# Patient Record
Sex: Female | Born: 1940
Health system: Southern US, Community
[De-identification: ages and names within clinical notes are randomized; demographics above are authoritative.]

## PROBLEM LIST (undated history)

## (undated) DIAGNOSIS — I2510 Atherosclerotic heart disease of native coronary artery without angina pectoris: Secondary | ICD-10-CM

## (undated) DIAGNOSIS — I10 Essential (primary) hypertension: Secondary | ICD-10-CM

## (undated) DIAGNOSIS — Z9989 Dependence on other enabling machines and devices: Secondary | ICD-10-CM

## (undated) DIAGNOSIS — G8190 Hemiplegia, unspecified affecting unspecified side: Secondary | ICD-10-CM

## (undated) DIAGNOSIS — I499 Cardiac arrhythmia, unspecified: Secondary | ICD-10-CM

## (undated) DIAGNOSIS — N183 Chronic kidney disease, stage 3 unspecified: Secondary | ICD-10-CM

## (undated) DIAGNOSIS — I255 Ischemic cardiomyopathy: Secondary | ICD-10-CM

## (undated) DIAGNOSIS — D696 Thrombocytopenia, unspecified: Secondary | ICD-10-CM

## (undated) DIAGNOSIS — I5022 Chronic systolic (congestive) heart failure: Secondary | ICD-10-CM

## (undated) DIAGNOSIS — Z7901 Long term (current) use of anticoagulants: Secondary | ICD-10-CM

## (undated) DIAGNOSIS — E1142 Type 2 diabetes mellitus with diabetic polyneuropathy: Secondary | ICD-10-CM

## (undated) DIAGNOSIS — M1990 Unspecified osteoarthritis, unspecified site: Secondary | ICD-10-CM

## (undated) DIAGNOSIS — I509 Heart failure, unspecified: Secondary | ICD-10-CM

## (undated) DIAGNOSIS — G894 Chronic pain syndrome: Secondary | ICD-10-CM

## (undated) DIAGNOSIS — I6930 Unspecified sequelae of cerebral infarction: Secondary | ICD-10-CM

## (undated) DIAGNOSIS — M545 Low back pain, unspecified: Secondary | ICD-10-CM

## (undated) DIAGNOSIS — G4733 Obstructive sleep apnea (adult) (pediatric): Secondary | ICD-10-CM

## (undated) DIAGNOSIS — F411 Generalized anxiety disorder: Secondary | ICD-10-CM

## (undated) DIAGNOSIS — K625 Hemorrhage of anus and rectum: Secondary | ICD-10-CM

## (undated) DIAGNOSIS — E785 Hyperlipidemia, unspecified: Secondary | ICD-10-CM

## (undated) DIAGNOSIS — I63411 Cerebral infarction due to embolism of right middle cerebral artery: Secondary | ICD-10-CM

## (undated) DIAGNOSIS — K219 Gastro-esophageal reflux disease without esophagitis: Secondary | ICD-10-CM

## (undated) DIAGNOSIS — H3530 Unspecified macular degeneration: Secondary | ICD-10-CM

## (undated) DIAGNOSIS — K922 Gastrointestinal hemorrhage, unspecified: Secondary | ICD-10-CM

## (undated) DIAGNOSIS — I4819 Other persistent atrial fibrillation: Secondary | ICD-10-CM

## (undated) DIAGNOSIS — I69392 Facial weakness following cerebral infarction: Secondary | ICD-10-CM

## (undated) DIAGNOSIS — G8929 Other chronic pain: Secondary | ICD-10-CM

## (undated) DIAGNOSIS — I4811 Longstanding persistent atrial fibrillation: Secondary | ICD-10-CM

## (undated) HISTORY — PX: EYE SURGERY: SHX253

## (undated) HISTORY — DX: Unspecified macular degeneration: H35.30

## (undated) HISTORY — PX: BACK SURGERY: SHX140

## (undated) HISTORY — DX: Obstructive sleep apnea (adult) (pediatric): Z99.89

## (undated) HISTORY — PX: HERNIA REPAIR: SHX51

## (undated) HISTORY — DX: Ischemic cardiomyopathy: I25.5

## (undated) HISTORY — DX: Longstanding persistent atrial fibrillation: I48.11

## (undated) HISTORY — DX: Obstructive sleep apnea (adult) (pediatric): G47.33

## (undated) HISTORY — PX: TEE WITH CARDIOVERSION: SHX5442

## (undated) HISTORY — DX: Atherosclerotic heart disease of native coronary artery without angina pectoris: I25.10

## (undated) HISTORY — DX: Hyperlipidemia, unspecified: E78.5

## (undated) HISTORY — DX: Hemorrhage of anus and rectum: K62.5

## (undated) HISTORY — DX: Long term (current) use of anticoagulants: Z79.01

## (undated) HISTORY — DX: Chronic systolic (congestive) heart failure: I50.22

## (undated) HISTORY — PX: JOINT REPLACEMENT: SHX530

---
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
## 1992-11-12 HISTORY — PX: TOTAL KNEE ARTHROPLASTY: SHX125

## 1998-02-07 ENCOUNTER — Other Ambulatory Visit: Admission: RE | Admit: 1998-02-07 | Discharge: 1998-02-07 | Payer: Self-pay | Admitting: Sports Medicine

## 1998-05-18 ENCOUNTER — Other Ambulatory Visit: Admission: RE | Admit: 1998-05-18 | Discharge: 1998-05-18 | Payer: Self-pay | Admitting: Sports Medicine

## 1999-10-13 ENCOUNTER — Encounter: Payer: Self-pay | Admitting: General Surgery

## 1999-10-13 HISTORY — PX: VENTRAL HERNIA REPAIR: SHX424

## 1999-10-16 ENCOUNTER — Inpatient Hospital Stay (HOSPITAL_COMMUNITY): Admission: RE | Admit: 1999-10-16 | Discharge: 1999-10-18 | Payer: Self-pay | Admitting: General Surgery

## 2000-02-29 ENCOUNTER — Encounter: Payer: Self-pay | Admitting: Cardiovascular Disease

## 2004-04-05 ENCOUNTER — Ambulatory Visit (HOSPITAL_COMMUNITY): Admission: RE | Admit: 2004-04-05 | Discharge: 2004-04-05 | Payer: Self-pay | Admitting: Sports Medicine

## 2005-07-26 ENCOUNTER — Encounter: Admission: RE | Admit: 2005-07-26 | Discharge: 2005-07-26 | Payer: Self-pay | Admitting: Sports Medicine

## 2006-04-02 ENCOUNTER — Encounter: Admission: RE | Admit: 2006-04-02 | Discharge: 2006-04-02 | Payer: Self-pay | Admitting: Sports Medicine

## 2006-04-24 ENCOUNTER — Encounter: Admission: RE | Admit: 2006-04-24 | Discharge: 2006-04-24 | Payer: Self-pay | Admitting: Sports Medicine

## 2006-05-16 ENCOUNTER — Encounter: Admission: RE | Admit: 2006-05-16 | Discharge: 2006-05-16 | Payer: Self-pay | Admitting: Sports Medicine

## 2006-06-04 ENCOUNTER — Encounter: Admission: RE | Admit: 2006-06-04 | Discharge: 2006-06-04 | Payer: Self-pay | Admitting: Sports Medicine

## 2007-11-13 HISTORY — PX: CORONARY ANGIOPLASTY WITH STENT PLACEMENT: SHX49

## 2008-05-06 ENCOUNTER — Ambulatory Visit: Payer: Self-pay

## 2008-05-06 ENCOUNTER — Ambulatory Visit: Payer: Self-pay | Admitting: Cardiology

## 2008-05-06 LAB — CONVERTED CEMR LAB
Basophils Absolute: 0 10*3/uL (ref 0.0–0.1)
Basophils Relative: 0.6 % (ref 0.0–1.0)
CO2: 28 meq/L (ref 19–32)
Chloride: 98 meq/L (ref 96–112)
Creatinine, Ser: 1 mg/dL (ref 0.4–1.2)
Glucose, Bld: 126 mg/dL — ABNORMAL HIGH (ref 70–99)
INR: 1.1 — ABNORMAL HIGH (ref 0.8–1.0)
Lymphocytes Relative: 21 % (ref 12.0–46.0)
MCHC: 33 g/dL (ref 30.0–36.0)
Monocytes Relative: 8.4 % (ref 3.0–12.0)
Neutrophils Relative %: 64.6 % (ref 43.0–77.0)
Prothrombin Time: 13.1 s (ref 10.9–13.3)
RBC: 3.91 M/uL (ref 3.87–5.11)
Sodium: 137 meq/L (ref 135–145)
aPTT: 26.1 s (ref 21.7–29.8)

## 2008-05-12 ENCOUNTER — Ambulatory Visit: Payer: Self-pay | Admitting: Cardiothoracic Surgery

## 2008-05-12 ENCOUNTER — Encounter: Payer: Self-pay | Admitting: Cardiothoracic Surgery

## 2008-05-12 ENCOUNTER — Inpatient Hospital Stay (HOSPITAL_BASED_OUTPATIENT_CLINIC_OR_DEPARTMENT_OTHER): Admission: RE | Admit: 2008-05-12 | Discharge: 2008-05-12 | Payer: Self-pay | Admitting: Cardiology

## 2008-05-12 ENCOUNTER — Inpatient Hospital Stay (HOSPITAL_COMMUNITY): Admission: AD | Admit: 2008-05-12 | Discharge: 2008-06-10 | Payer: Self-pay | Admitting: Cardiology

## 2008-05-12 ENCOUNTER — Ambulatory Visit: Payer: Self-pay | Admitting: Cardiology

## 2008-05-12 DIAGNOSIS — I2510 Atherosclerotic heart disease of native coronary artery without angina pectoris: Secondary | ICD-10-CM

## 2008-05-12 HISTORY — DX: Atherosclerotic heart disease of native coronary artery without angina pectoris: I25.10

## 2008-05-13 ENCOUNTER — Encounter: Payer: Self-pay | Admitting: Cardiology

## 2008-05-13 ENCOUNTER — Ambulatory Visit: Payer: Self-pay | Admitting: Cardiology

## 2008-05-17 ENCOUNTER — Encounter: Payer: Self-pay | Admitting: Cardiothoracic Surgery

## 2008-06-07 ENCOUNTER — Encounter: Payer: Self-pay | Admitting: Cardiothoracic Surgery

## 2008-06-08 ENCOUNTER — Encounter: Payer: Self-pay | Admitting: Cardiology

## 2008-06-08 ENCOUNTER — Ambulatory Visit: Payer: Self-pay | Admitting: Vascular Surgery

## 2008-06-24 ENCOUNTER — Encounter: Admission: RE | Admit: 2008-06-24 | Discharge: 2008-06-24 | Payer: Self-pay | Admitting: Cardiothoracic Surgery

## 2008-06-24 ENCOUNTER — Ambulatory Visit: Payer: Self-pay | Admitting: Cardiothoracic Surgery

## 2008-07-06 ENCOUNTER — Ambulatory Visit: Payer: Self-pay | Admitting: Cardiology

## 2008-07-06 LAB — CONVERTED CEMR LAB
BUN: 33 mg/dL — ABNORMAL HIGH (ref 6–23)
Basophils Relative: 0.6 % (ref 0.0–3.0)
Calcium: 9.1 mg/dL (ref 8.4–10.5)
Creatinine, Ser: 2.6 mg/dL — ABNORMAL HIGH (ref 0.4–1.2)
Eosinophils Relative: 2.8 % (ref 0.0–5.0)
GFR calc Af Amer: 23 mL/min
Glucose, Bld: 158 mg/dL — ABNORMAL HIGH (ref 70–99)
HCT: 34.5 % — ABNORMAL LOW (ref 36.0–46.0)
Hemoglobin: 11.6 g/dL — ABNORMAL LOW (ref 12.0–15.0)
Monocytes Absolute: 0.7 10*3/uL (ref 0.1–1.0)
Monocytes Relative: 8.2 % (ref 3.0–12.0)
Neutro Abs: 5.1 10*3/uL (ref 1.4–7.7)
WBC: 8.3 10*3/uL (ref 4.5–10.5)

## 2008-07-22 ENCOUNTER — Ambulatory Visit: Payer: Self-pay | Admitting: Cardiothoracic Surgery

## 2008-07-29 ENCOUNTER — Encounter (HOSPITAL_COMMUNITY): Admission: RE | Admit: 2008-07-29 | Discharge: 2008-10-27 | Payer: Self-pay | Admitting: Cardiology

## 2008-08-04 ENCOUNTER — Ambulatory Visit: Payer: Self-pay | Admitting: Cardiology

## 2008-08-11 ENCOUNTER — Ambulatory Visit: Payer: Self-pay | Admitting: Cardiology

## 2008-08-18 ENCOUNTER — Ambulatory Visit: Payer: Self-pay | Admitting: Cardiology

## 2008-08-18 ENCOUNTER — Ambulatory Visit: Payer: Self-pay | Admitting: Internal Medicine

## 2008-09-01 ENCOUNTER — Ambulatory Visit: Payer: Self-pay | Admitting: Cardiology

## 2008-09-29 ENCOUNTER — Ambulatory Visit: Payer: Self-pay | Admitting: Cardiology

## 2008-10-15 ENCOUNTER — Ambulatory Visit: Payer: Self-pay | Admitting: Internal Medicine

## 2008-10-19 ENCOUNTER — Ambulatory Visit: Payer: Self-pay

## 2008-10-19 ENCOUNTER — Encounter: Payer: Self-pay | Admitting: Internal Medicine

## 2008-10-19 ENCOUNTER — Ambulatory Visit: Payer: Self-pay | Admitting: Cardiology

## 2008-10-19 LAB — CONVERTED CEMR LAB
Albumin: 3.8 g/dL (ref 3.5–5.2)
Alkaline Phosphatase: 63 units/L (ref 39–117)
Bilirubin, Direct: 0.1 mg/dL (ref 0.0–0.3)
LDL Cholesterol: 69 mg/dL (ref 0–99)
Total CHOL/HDL Ratio: 2.5
Triglycerides: 49 mg/dL (ref 0–149)

## 2008-10-21 ENCOUNTER — Ambulatory Visit: Payer: Self-pay | Admitting: Cardiology

## 2008-10-21 LAB — CONVERTED CEMR LAB
CO2: 28 meq/L (ref 19–32)
Chloride: 103 meq/L (ref 96–112)
Creatinine, Ser: 1.5 mg/dL — ABNORMAL HIGH (ref 0.4–1.2)
TSH: 1.69 microintl units/mL (ref 0.35–5.50)

## 2008-11-10 ENCOUNTER — Ambulatory Visit: Payer: Self-pay | Admitting: Internal Medicine

## 2008-11-15 ENCOUNTER — Encounter (HOSPITAL_COMMUNITY): Admission: RE | Admit: 2008-11-15 | Discharge: 2008-11-15 | Payer: Self-pay | Admitting: Cardiology

## 2008-12-01 ENCOUNTER — Ambulatory Visit: Payer: Self-pay | Admitting: Internal Medicine

## 2008-12-22 ENCOUNTER — Ambulatory Visit: Payer: Self-pay | Admitting: Cardiology

## 2009-01-31 DIAGNOSIS — I5022 Chronic systolic (congestive) heart failure: Secondary | ICD-10-CM

## 2009-01-31 DIAGNOSIS — I255 Ischemic cardiomyopathy: Secondary | ICD-10-CM

## 2009-01-31 DIAGNOSIS — E119 Type 2 diabetes mellitus without complications: Secondary | ICD-10-CM | POA: Insufficient documentation

## 2009-01-31 DIAGNOSIS — F411 Generalized anxiety disorder: Secondary | ICD-10-CM

## 2009-01-31 DIAGNOSIS — I4891 Unspecified atrial fibrillation: Secondary | ICD-10-CM | POA: Insufficient documentation

## 2009-01-31 DIAGNOSIS — Z8679 Personal history of other diseases of the circulatory system: Secondary | ICD-10-CM | POA: Insufficient documentation

## 2009-01-31 DIAGNOSIS — I509 Heart failure, unspecified: Secondary | ICD-10-CM

## 2009-01-31 DIAGNOSIS — E785 Hyperlipidemia, unspecified: Secondary | ICD-10-CM | POA: Insufficient documentation

## 2009-01-31 DIAGNOSIS — I2510 Atherosclerotic heart disease of native coronary artery without angina pectoris: Secondary | ICD-10-CM | POA: Insufficient documentation

## 2009-01-31 DIAGNOSIS — I25810 Atherosclerosis of coronary artery bypass graft(s) without angina pectoris: Secondary | ICD-10-CM | POA: Insufficient documentation

## 2009-01-31 HISTORY — DX: Generalized anxiety disorder: F41.1

## 2009-01-31 HISTORY — DX: Ischemic cardiomyopathy: I25.5

## 2009-02-01 ENCOUNTER — Ambulatory Visit: Payer: Self-pay | Admitting: Cardiovascular Disease

## 2009-02-01 ENCOUNTER — Encounter: Payer: Self-pay | Admitting: Cardiology

## 2009-02-01 ENCOUNTER — Ambulatory Visit: Payer: Self-pay | Admitting: Cardiology

## 2009-02-01 LAB — CONVERTED CEMR LAB
CO2: 31 meq/L (ref 19–32)
Chloride: 103 meq/L (ref 96–112)
Creatinine, Ser: 1.7 mg/dL — ABNORMAL HIGH (ref 0.4–1.2)

## 2009-02-22 ENCOUNTER — Ambulatory Visit: Payer: Self-pay | Admitting: Cardiology

## 2009-03-14 ENCOUNTER — Ambulatory Visit: Payer: Self-pay | Admitting: Internal Medicine

## 2009-03-31 ENCOUNTER — Ambulatory Visit: Payer: Self-pay | Admitting: Internal Medicine

## 2009-04-13 ENCOUNTER — Encounter: Payer: Self-pay | Admitting: *Deleted

## 2009-04-14 ENCOUNTER — Ambulatory Visit: Payer: Self-pay | Admitting: Cardiology

## 2009-04-14 LAB — CONVERTED CEMR LAB: Protime: 19.5

## 2009-05-04 ENCOUNTER — Ambulatory Visit: Payer: Self-pay | Admitting: Internal Medicine

## 2009-05-04 ENCOUNTER — Encounter (INDEPENDENT_AMBULATORY_CARE_PROVIDER_SITE_OTHER): Payer: Self-pay | Admitting: Pharmacist

## 2009-05-04 ENCOUNTER — Ambulatory Visit: Payer: Self-pay | Admitting: Cardiology

## 2009-05-04 LAB — CONVERTED CEMR LAB
POC INR: 2
Prothrombin Time: 17.6 s

## 2009-05-05 ENCOUNTER — Ambulatory Visit: Payer: Self-pay | Admitting: Cardiology

## 2009-05-09 LAB — CONVERTED CEMR LAB
ALT: 14 units/L (ref 0–35)
Basophils Absolute: 0 10*3/uL (ref 0.0–0.1)
Bilirubin, Direct: 0.1 mg/dL (ref 0.0–0.3)
CO2: 28 meq/L (ref 19–32)
Calcium: 8.8 mg/dL (ref 8.4–10.5)
Chloride: 104 meq/L (ref 96–112)
Eosinophils Absolute: 0.2 10*3/uL (ref 0.0–0.7)
HDL: 50 mg/dL (ref >39.00)
LDL Cholesterol: 59 mg/dL (ref 0–99)
Lymphocytes Relative: 24.4 % (ref 12.0–46.0)
Monocytes Relative: 8.7 % (ref 3.0–12.0)
Platelets: 191 10*3/uL (ref 150.0–400.0)
RDW: 13.8 % (ref 11.5–14.6)
Sodium: 141 meq/L (ref 135–145)
TSH: 2.96 microintl units/mL (ref 0.35–5.50)
Total Bilirubin: 0.8 mg/dL (ref 0.3–1.2)
Total CHOL/HDL Ratio: 2
Triglycerides: 62 mg/dL (ref 0.0–149.0)
VLDL: 12.4 mg/dL (ref 0.0–40.0)

## 2009-05-18 ENCOUNTER — Encounter: Payer: Self-pay | Admitting: *Deleted

## 2009-06-24 ENCOUNTER — Encounter: Payer: Self-pay | Admitting: Cardiology

## 2009-09-06 ENCOUNTER — Ambulatory Visit: Payer: Self-pay | Admitting: Cardiology

## 2009-09-19 ENCOUNTER — Encounter: Payer: Self-pay | Admitting: Cardiology

## 2009-12-15 ENCOUNTER — Ambulatory Visit: Payer: Self-pay | Admitting: Cardiology

## 2009-12-27 ENCOUNTER — Ambulatory Visit: Payer: Self-pay | Admitting: Cardiology

## 2009-12-27 ENCOUNTER — Encounter: Payer: Self-pay | Admitting: Cardiology

## 2009-12-27 ENCOUNTER — Ambulatory Visit (HOSPITAL_COMMUNITY): Admission: RE | Admit: 2009-12-27 | Discharge: 2009-12-27 | Payer: Self-pay | Admitting: Cardiology

## 2009-12-27 ENCOUNTER — Ambulatory Visit: Payer: Self-pay

## 2009-12-29 ENCOUNTER — Telehealth (INDEPENDENT_AMBULATORY_CARE_PROVIDER_SITE_OTHER): Payer: Self-pay | Admitting: *Deleted

## 2010-01-04 ENCOUNTER — Telehealth: Payer: Self-pay | Admitting: Cardiology

## 2010-01-04 LAB — CONVERTED CEMR LAB
BUN: 27 mg/dL — ABNORMAL HIGH (ref 6–23)
Basophils Absolute: 0 10*3/uL (ref 0.0–0.1)
CO2: 31 meq/L (ref 19–32)
Calcium: 9 mg/dL (ref 8.4–10.5)
Chloride: 99 meq/L (ref 96–112)
Creatinine, Ser: 1.3 mg/dL — ABNORMAL HIGH (ref 0.4–1.2)
Eosinophils Absolute: 0.2 10*3/uL (ref 0.0–0.7)
Glucose, Bld: 279 mg/dL — ABNORMAL HIGH (ref 70–99)
Lymphocytes Relative: 19.3 % (ref 12.0–46.0)
MCHC: 32.6 g/dL (ref 30.0–36.0)
MCV: 89.7 fL (ref 78.0–100.0)
Monocytes Absolute: 0.6 10*3/uL (ref 0.1–1.0)
Neutrophils Relative %: 67.8 % (ref 43.0–77.0)
Platelets: 193 10*3/uL (ref 150.0–400.0)
RBC: 4.36 M/uL (ref 3.87–5.11)
RDW: 12.8 % (ref 11.5–14.6)

## 2010-01-11 ENCOUNTER — Telehealth: Payer: Self-pay | Admitting: Cardiology

## 2010-01-17 ENCOUNTER — Encounter: Payer: Self-pay | Admitting: Cardiology

## 2010-03-13 ENCOUNTER — Telehealth: Payer: Self-pay | Admitting: Cardiology

## 2010-04-18 ENCOUNTER — Ambulatory Visit: Payer: Self-pay | Admitting: Cardiology

## 2010-06-05 ENCOUNTER — Telehealth: Payer: Self-pay | Admitting: Cardiology

## 2010-07-13 ENCOUNTER — Telehealth: Payer: Self-pay | Admitting: Cardiology

## 2010-08-14 ENCOUNTER — Encounter: Payer: Self-pay | Admitting: Cardiology

## 2010-10-12 ENCOUNTER — Encounter: Payer: Self-pay | Admitting: Cardiology

## 2010-10-12 ENCOUNTER — Ambulatory Visit: Payer: Self-pay | Admitting: Cardiology

## 2010-10-17 LAB — CONVERTED CEMR LAB
Cholesterol: 148 mg/dL (ref 0–200)
HDL: 40.3 mg/dL (ref >39.00)
Magnesium: 2.2 mg/dL (ref 1.5–2.5)
VLDL: 22 mg/dL (ref 0.0–40.0)

## 2010-10-18 ENCOUNTER — Telehealth: Payer: Self-pay | Admitting: Cardiology

## 2010-11-09 ENCOUNTER — Telehealth: Payer: Self-pay | Admitting: Cardiovascular Disease

## 2010-11-10 ENCOUNTER — Ambulatory Visit
Admission: RE | Admit: 2010-11-10 | Discharge: 2010-11-10 | Payer: Self-pay | Source: Home / Self Care | Attending: Cardiovascular Disease | Admitting: Cardiovascular Disease

## 2010-11-12 HISTORY — PX: CATARACT EXTRACTION: SUR2

## 2010-12-03 ENCOUNTER — Encounter: Payer: Self-pay | Admitting: Sports Medicine

## 2010-12-10 LAB — CONVERTED CEMR LAB
BUN: 27 mg/dL — ABNORMAL HIGH (ref 6–23)
Basophils Absolute: 0 10*3/uL (ref 0.0–0.1)
Basophils Relative: 0.6 % (ref 0.0–3.0)
CO2: 27 meq/L (ref 19–32)
Chloride: 100 meq/L (ref 96–112)
GFR calc non Af Amer: 36.44 mL/min (ref >60)
GFR calc non Af Amer: 45.31 mL/min (ref >60)
Glucose, Bld: 187 mg/dL — ABNORMAL HIGH (ref 70–99)
HCT: 36.9 % (ref 36.0–46.0)
Hemoglobin: 12.8 g/dL (ref 12.0–15.0)
Lymphs Abs: 2 10*3/uL (ref 0.7–4.0)
Magnesium: 2 mg/dL (ref 1.5–2.5)
Monocytes Relative: 6.3 % (ref 3.0–12.0)
Neutro Abs: 5.3 10*3/uL (ref 1.4–7.7)
Potassium: 4.4 meq/L (ref 3.5–5.1)
Potassium: 4.6 meq/L (ref 3.5–5.1)
RBC: 4.08 M/uL (ref 3.87–5.11)
RDW: 13.4 % (ref 11.5–14.6)
Sodium: 137 meq/L (ref 135–145)
Sodium: 138 meq/L (ref 135–145)

## 2010-12-14 NOTE — Assessment & Plan Note (Signed)
Summary: ec6/wpa   Visit Type:  eph Primary Provider:  [name]  CC:  swelling in lower legs and feet.  History of Present Illness: 70 yo WF with h/o CAD s/p CABG, systolic CHF, ischemic CM, HL, DM here today as an add on for evaluation of swelling in legs. She is followed in the past by Dr. Olevia Perches. She had bypass surgery in 2009. Her ejection fraction improved from 30% to 45-50%. She has a history of systolic CHF. She had atrial fibrillation at the time of her surgery and was treated with amiodarone and Coumadin and is now off both drugs.  She had an echocardiogram done in February which showed an ejection fraction of 45%.  She tells me that she ate ham for Christmas. She has been drinking excess water over the last few days. She has had 10 bottles of water per day over the last week. She has noticed swelling in both legs over the last 4 days. The right leg was painful behind the knee. She called our office and was instructed to take an extra Torsemide  yesterday and cut back on the salt and fluid  intake. She is feeling much better. No chest pain, SOB, dizziness. Her lower extremity swelling is much improved today. Her right leg is no longer painful.     Current Medications (verified): 1)  Coreg 6.25 Mg Tabs (Carvedilol) .Marland Kitchen.. 1 Tab Bid 2)  Glyburide 5 Mg  Tabs (Glyburide) .... 4 Tablets By Mouth Daily 3)  Torsemide 20 Mg Tabs (Torsemide) .... Take Two Tablet By Mouth Daily 4)  Potassium Chloride Crys Cr 20 Meq Cr-Tabs (Potassium Chloride Crys Cr) .... Take One Tablet By Mouth Daily 5)  Simvastatin 40 Mg Tabs (Simvastatin) .Marland Kitchen.. 1 Tab Daily 6)  Nitrostat 0.4 Mg Subl (Nitroglycerin) .Marland Kitchen.. 1 Tablet Under Tongue At Onset of Chest Pain; You May Repeat Every 5 Minutes For Up To 3 Doses. 7)  Colace 100 Mg Caps (Docusate Sodium) .... Take One Once Daily As Needed 8)  Aspirin 81 Mg Tbec (Aspirin) .... Take One Tablet By Mouth Daily 9)  Losartan Potassium 25 Mg Tabs (Losartan Potassium) .... Take 1  Tablet By Mouth Once A Day 10)  Cyanocobalamin 2021mcg .... Take One Tab By Mouth Once Daily  Allergies (verified): 1)  ! Bactrim 2)  ! * Benazepril 3)  ! * Multigen 4)  ! * Spironolactone  Past History:  Past Medical History: Reviewed history from 01/31/2009 and no changes required. 1. Coronary artery disease status post coronary artery bypass graft     surgery in July 2009. 2. Ischemic cardiomyopathy with ejection fraction of 30%, improved to     45-50% by a recent echo. 3. Systolic heart failure, now appears euvolemic. 4. Postoperative atrial fibrillation status post transesophageal     echocardiography-guided cardioversion, now holding sinus rhythm on     amiodarone and Coumadin. 5. Diabetes. 6. Hyperlipidemia.   Past Surgical History: Reviewed history from 01/31/2009 and no changes required. repair of ventral hernia  with mesh in December 2000, right total knee replacement in 1994,   appendectomy at age 35.   Family History: Reviewed history from 01/31/2009 and no changes required. Negative for cardiovascular disease.  Mother died at 58   with cerebral hemorrhage.  Father died at 67 of emphysema.  She had a   brother who died at 4 with lung cancer.      Social History: Reviewed history from 01/31/2009 and no changes required.  Widow, lives alone  in [place]. 2 chldren. She denies any tobacco, alcohol, or drug use.  She is not routinely exercising.      Review of Systems       The patient complains of leg swelling.  The patient denies fatigue, malaise, fever, weight gain/loss, vision loss, decreased hearing, hoarseness, chest pain, palpitations, shortness of breath, prolonged cough, wheezing, sleep apnea, coughing up blood, abdominal pain, blood in stool, nausea, vomiting, diarrhea, heartburn, incontinence, blood in urine, muscle weakness, joint pain, rash, skin lesions, headache, fainting, dizziness, depression, anxiety, enlarged lymph nodes, easy bruising or  bleeding, and environmental allergies.    Vital Signs:  Patient profile:   70 year old female Height:      69 inches Weight:      324 pounds BMI:     48.02 Pulse rate:   72 / minute BP sitting:   140 / 78  (left arm) Cuff size:   regular  Vitals Entered By: Hansel Feinstein CMA (November 10, 2010 2:16 PM)  Physical Exam  General:  General: Well developed, well nourished, NAD HEENT: OP clear, mucus membranes moist SKIN: warm, dry Neuro: No focal deficits Musculoskeletal: Muscle strength 5/5 all ext Psychiatric: Mood and affect normal Neck: No JVD, no carotid bruits, no thyromegaly, no lymphadenopathy. Lungs:Clear bilaterally, no wheezes, rhonci, crackles CV: RRR no murmurs, gallops rubs Abdomen: soft, NT, ND, BS present Extremities: Trace edema, pulses difficult to palpate secondary to size.    Impression & Recommendations:  Problem # 1:  SYSTOLIC HEART FAILURE, CHRONIC (ICD-428.22) Volume better on increased dose of Torsemide. She will take an extra 20 mg tonight then go back to 40 mg by mouth daily tomorrow.  NO changes today.   Her updated medication list for this problem includes:    Coreg 6.25 Mg Tabs (Carvedilol) .Marland Kitchen... 1 tab bid    Torsemide 20 Mg Tabs (Torsemide) .Marland Kitchen... Take two tablet by mouth daily    Nitrostat 0.4 Mg Subl (Nitroglycerin) .Marland Kitchen... 1 tablet under tongue at onset of chest pain; you may repeat every 5 minutes for up to 3 doses.    Aspirin 81 Mg Tbec (Aspirin) .Marland Kitchen... Take one tablet by mouth daily    Losartan Potassium 25 Mg Tabs (Losartan potassium) .Marland Kitchen... Take 1 tablet by mouth once a day  Patient Instructions: 1)  Your physician recommends that you schedule a follow-up appointment in: 6 months.   Appended Document: ec6/wpa    Clinical Lists Changes  Observations: Added new observation of PI CARDIO: Your physician recommends that you schedule a follow-up appointment in: 6 months with Dr. Angelena Form Your physician recommends that you continue on your  current medications as directed. Please refer to the Current Medication list given to you today. (11/10/2010 14:41)       Patient Instructions: 1)  Your physician recommends that you schedule a follow-up appointment in: 6 months with Dr. Angelena Form 2)  Your physician recommends that you continue on your current medications as directed. Please refer to the Current Medication list given to you today.

## 2010-12-14 NOTE — Progress Notes (Signed)
Summary: pt needs refill  Medications Added POTASSIUM CHLORIDE CRYS CR 20 MEQ CR-TABS (POTASSIUM CHLORIDE CRYS CR) Take one tablet by mouth daily       Phone Note Refill Request Message from:  Patient on perscription solution  Refills Requested: Medication #1:  COREG 6.25 MG TABS 1 tab bid  Medication #2:  SIMVASTATIN 40 MG TABS 1 tab daily  Medication #3:  KLOR-CON 20MEQ Take one tablet by mouth daily Initial call taken by: Shelda Pal,  Mar 13, 2010 8:42 AM  Follow-up for Phone Call        Refills went [facility] faxed ino prescription soultion Follow-up by: Lubertha Basque, CNA,  Mar 13, 2010 1:32 PM    New/Updated Medications: POTASSIUM CHLORIDE CRYS CR 20 MEQ CR-TABS (POTASSIUM CHLORIDE CRYS CR) Take one tablet by mouth daily Prescriptions: POTASSIUM CHLORIDE CRYS CR 20 MEQ CR-TABS (POTASSIUM CHLORIDE CRYS CR) Take one tablet by mouth daily  #90 x 3   Entered by:   Alvis Lemmings, RN, BSN   Authorized by:   Fatima Sanger, MD, [facility]   Signed by:   Alvis Lemmings, RN, BSN on 03/14/2010   Method used:   Electronically to        [facility] (mail-order)       [place]  [address]       Ph: [phone]       Fax: [phone]   [id number] SIMVASTATIN 40 MG TABS (SIMVASTATIN) 1 tab daily  #90 x 3   Entered by:   Lubertha Basque, CNA   Authorized by:   Fatima Sanger, MD, [facility]   Signed by:   Lubertha Basque, CNA on 03/13/2010   Method used:   Electronically to        [facility] (mail-order)       [address] [place] [address]  [address]       Ph: [phone]       Fax: [phone]   [id number] COREG 6.25 MG TABS (CARVEDILOL) 1 tab bid  #180 x 390   Entered by:   Lubertha Basque, CNA   Authorized by:   Fatima Sanger, MD, [facility]   Signed by:   Lubertha Basque, CNA on 03/13/2010   Method used:   Electronically to        [facility] (mail-order)       [address] [place]  [address]       Ph: [phone]       Fax: [phone]   [id number]

## 2010-12-14 NOTE — Progress Notes (Signed)
Summary: refill request   Phone Note Refill Request Message from:  Patient on July 13, 2010 11:19 AM  Refills Requested: Medication #1:  TORSEMIDE 20 MG TABS Take two tablet by mout daily [facility] [phone]   Method Requested: Telephone to Pharmacy Initial call taken by: Lorenda Hatchet,  July 13, 2010 11:19 AM    Prescriptions: TORSEMIDE 20 MG TABS (TORSEMIDE) Take two tablet by mout daily  #180 x 3   Entered by:   Lubertha Basque, CNA   Authorized by:   Fatima Sanger, MD, [facility]   Signed by:   Lubertha Basque, CNA on 07/14/2010   Method used:   Electronically to        [facility] (mail-order)       [address] [place]  [address]       Ph: [phone]       Fax: [phone]   [id number]

## 2010-12-14 NOTE — Assessment & Plan Note (Signed)
Summary: f51m  Medications Added TORSEMIDE 20 MG TABS (TORSEMIDE) Take two tablet by mouth daily NITROSTAT 0.4 MG SUBL (NITROGLYCERIN) 1 tablet under tongue at onset of chest pain; you may repeat every 5 minutes for up to 3 doses. COLACE 100 MG CAPS (DOCUSATE SODIUM) take one once daily as needed ASPIRIN 81 MG TBEC (ASPIRIN) Take one tablet by mouth daily * CYANOCOBALAMIN 2000MCG take one tab by mouth once daily      Allergies Added:   Visit Type:  Follow-up Primary Konner Saiz:  [name]  CC:  The pt denies CP/ SOB/ or edema. She has had leg cramps/ dry mouth/ and tinnitis for about 6 months. She thinks this may be related to her fluid pill.Marland Kitchen  History of Present Illness: The patient is 70 years old and return for a 101-month followup office visit for management of CAD and CHF. She had bypass surgery in 2009. Her ejection fraction improved from 30% to 45-50%. She has a history of systolic CHF. She had atrial fibrillation at the time of her surgery and was treated with amiodarone and Coumadin and is now off both drugs.  She had an echocardiogram done in February which showed an ejection fraction of 45%.  She has had no chest pain shortness of breath palpitations or swelling. She has had some muscle cramps and she has had some postural dizziness when she gets up.  Other problems include hyperlipidemia and diabetes.  She had a son and daughter-in-law who are living with her but they have left. She had problems with the daughter-in-law. Her son and daughter-in-law have now split.  Current Medications (verified): 1)  Coreg 6.25 Mg Tabs (Carvedilol) .Marland Kitchen.. 1 Tab Bid 2)  Glyburide 5 Mg  Tabs (Glyburide) .... 4 Tablets By Mouth Daily 3)  Torsemide 20 Mg Tabs (Torsemide) .... Take Two Tablet By Mouth Daily 4)  Potassium Chloride Crys Cr 20 Meq Cr-Tabs (Potassium Chloride Crys Cr) .... Take One Tablet By Mouth Daily 5)  Simvastatin 40 Mg Tabs (Simvastatin) .Marland Kitchen.. 1 Tab Daily 6)  Nitrostat 0.4 Mg  Subl (Nitroglycerin) .Marland Kitchen.. 1 Tablet Under Tongue At Onset of Chest Pain; You May Repeat Every 5 Minutes For Up To 3 Doses. 7)  Colace 100 Mg Caps (Docusate Sodium) .... Take One Once Daily As Needed 8)  Aspirin 81 Mg Tbec (Aspirin) .... Take One Tablet By Mouth Daily 9)  Losartan Potassium 25 Mg Tabs (Losartan Potassium) .... Take 1 Tablet By Mouth Once A Day 10)  Cyanocobalamin 20101mcg .... Take One Tab By Mouth Once Daily  Allergies (verified): 1)  ! Bactrim 2)  ! * Benazepril 3)  ! * Multigen 4)  ! * Spironolactone  Past History:  Past Medical History: Reviewed history from 01/31/2009 and no changes required. 1. Coronary artery disease status post coronary artery bypass graft     surgery in July 2009. 2. Ischemic cardiomyopathy with ejection fraction of 30%, improved to     45-50% by a recent echo. 3. Systolic heart failure, now appears euvolemic. 4. Postoperative atrial fibrillation status post transesophageal     echocardiography-guided cardioversion, now holding sinus rhythm on     amiodarone and Coumadin. 5. Diabetes. 6. Hyperlipidemia.   Review of Systems       ROS is negative except as outlined in HPI.   Vital Signs:  Patient profile:   70 year old female Height:      69 inches Weight:      322.50 pounds BMI:  47.80 Pulse rate:   60 / minute Pulse (ortho):   50 / minute Pulse rhythm:   irregular Resp:     18 per minute BP sitting:   118 / 76  (left arm) BP standing:   120 / 60 Cuff size:   large  Vitals Entered By: Alvis Lemmings, RN, BSN (October 12, 2010 9:00 AM)  Serial Vital Signs/Assessments:  Time      Position  BP       Pulse  Resp  Temp     By           Lying RA  132/70   68                    Luz Jaramillo           Sitting   124/62   70                    Luz Jaramillo           Standing  120/60   50                    Luz Jaramillo 9:53 AM   Standing  114/62   61                    Luz Jaramillo 9:57 AM   Standing  120/60   60                     Sidney Ace  Comments: 9:57 AM Patient complaints of dizziness By: Sidney Ace    Physical Exam  Additional Exam:  Gen. Well-nourished, in no distress   Neck: JVP just above the clavicle, thyroid not enlarged, no carotid bruits Lungs: No tachypnea, clear without rales, rhonchi or wheezes Cardiovascular: Rhythm regular, PMI not displaced,  heart sounds  normal, no murmurs or gallops, trace to 1+ bilateral peripheral edema, pulses normal in all 4 extremities. Abdomen: BS normal, abdomen soft and non-tender without masses or organomegaly, no hepatosplenomegaly. MS: No deformities, no cyanosis or clubbing   Neuro:  No focal sns   Skin:  no lesions    Impression & Recommendations:  Problem # 1:  CAD, AUTOLOGOUS BYPASS GRAFT (ICD-414.02) She had bypass surgery in 2009. She's had no chest pain and this polyp is stable. Her updated medication list for this problem includes:    Coreg 6.25 Mg Tabs (Carvedilol) .Marland Kitchen... 1 tab bid    Nitrostat 0.4 Mg Subl (Nitroglycerin) .Marland Kitchen... 1 tablet under tongue at onset of chest pain; you may repeat every 5 minutes for up to 3 doses.    Aspirin 81 Mg Tbec (Aspirin) .Marland Kitchen... Take one tablet by mouth daily  Orders: EKG w/ Interpretation (93000)  Problem # 2:  SYSTOLIC HEART FAILURE, CHRONIC (ICD-428.22) She has chronic systolic heart failure. She appears about euvolemic today. Her last ejection fraction was 45% by echo earlier this year. We will continue current therapy. She recently had a BMP by her primary care physician. Her updated medication list for this problem includes:    Coreg 6.25 Mg Tabs (Carvedilol) .Marland Kitchen... 1 tab bid    Torsemide 20 Mg Tabs (Torsemide) .Marland Kitchen... Take two tablet by mouth daily    Nitrostat 0.4 Mg Subl (Nitroglycerin) .Marland Kitchen... 1 tablet under tongue at onset of chest pain; you may repeat every 5 minutes for up to 3 doses.    Aspirin 81 Mg Tbec (Aspirin) .Marland KitchenMarland KitchenMarland KitchenMarland Kitchen  Take one tablet by mouth daily    Losartan Potassium 25 Mg Tabs (Losartan  potassium) .Marland Kitchen... Take 1 tablet by mouth once a day  Problem # 3:  ATRIAL FIBRILLATION (ICD-427.31) She has a history of postoperative atrial fibrillation managed with amiodarone and Coumadin. She is off both of these drugs now. She has had no symptomatic recurrence. Her ECG showed APCs and PVCs today but no atrial fibrillation. We will continue current therapy. Her updated medication list for this problem includes:    Coreg 6.25 Mg Tabs (Carvedilol) .Marland Kitchen... 1 tab bid    Aspirin 81 Mg Tbec (Aspirin) .Marland Kitchen... Take one tablet by mouth daily  Orders: EKG w/ Interpretation (93000)  Problem # 4:  HYPERLIPIDEMIA (ICD-272.4)  He hasn't had a lipid profile in about a year and we'll plan to do that today. Her updated medication list for this problem includes:    Simvastatin 40 Mg Tabs (Simvastatin) .Marland Kitchen... 1 tab daily  Orders: TLB-Lipid Panel (80061-LIPID) TLB-Magnesium (Mg) (83735-MG)  Patient Instructions: 1)  Labwork today: lipid/magnesium (414.01;427.31;272.2). 2)  Your physician wants you to follow-up in:  6 months with Dr. Angelena Form. You will receive a reminder letter in the mail two months in advance. If you don't receive a letter, please call our office to schedule the follow-up appointment. 3)  Your physician recommends that you continue on your current medications as directed. Please refer to the Current Medication list given to you today.

## 2010-12-14 NOTE — Letter (Signed)
Summary: [facility] Office Note  [facility] Office Note   Imported By: Sallee Provencal 02/08/2010 16:31:08  _____________________________________________________________________  External Attachment:    Type:   Image     Comment:   External Document

## 2010-12-14 NOTE — Assessment & Plan Note (Signed)
Summary: per check out  Medications Added TORSEMIDE 20 MG TABS (TORSEMIDE) Take two tablets by mouth daily.      Allergies Added:   Primary Provider:  [name]   History of Present Illness: Patient is 70 years old and return for management of CAD and CHF. She had bypass surgery in July of 2009. Her ejection fraction was 30% that time and later improved to 45-50% by echo cardiography in December of 2009. She had postoperative atrial fibrillation treated with amiodarone and Coumadin which was discontinued later. She has had systolic heart failure associated with this. rt. She recently hadoing fairly well with her heart. She recently had a flulike illness and was feeling somewhat weak.  She also has had some social problems. Since her husband died her son is living with her and his wife has been somewhat difficult to get along with.  Other problems include hyperlipidemia and diabetes.  Current Medications (verified): 1)  Coreg 6.25 Mg Tabs (Carvedilol) .Marland Kitchen.. 1 Tab Bid 2)  Glyburide 5 Mg  Tabs (Glyburide) .... 2 Tablets By Mouth Daily 3)  Torsemide 20 Mg Tabs (Torsemide) .... Take One Tablet By Mouth Daily. 4)  Klor-Con 70meq .... Take One Tablet By Mouth Daily 5)  Simvastatin 40 Mg Tabs (Simvastatin) .Marland Kitchen.. 1 Tab Daily 6)  Oxcodone 5mg  .... As Needed  Pt Out 7)  Nitro .... Prn 8)  Stool Softner .... Prn 9)  Asa 81mg  .... 1 Tab Daily  Allergies (verified): 1)  ! Bactrim 2)  ! * Benazepril 3)  ! * Multigen 4)  ! * Spironolactone  Past History:  Past Medical History: Reviewed history from 01/31/2009 and no changes required. 1. Coronary artery disease status post coronary artery bypass graft     surgery in July 2009. 2. Ischemic cardiomyopathy with ejection fraction of 30%, improved to     45-50% by a recent echo. 3. Systolic heart failure, now appears euvolemic. 4. Postoperative atrial fibrillation status post transesophageal     echocardiography-guided cardioversion, now  holding sinus rhythm on     amiodarone and Coumadin. 5. Diabetes. 6. Hyperlipidemia.   Review of Systems       ROS is negative except as outlined in HPI.   Vital Signs:  Patient profile:   69 year old female Height:      69 inches Weight:      324 pounds BMI:     48.02 Pulse rate:   82 / minute Resp:     18 per minute BP sitting:   133 / 64  (left arm)  Vitals Entered By: Levora Angel, CNA (December 15, 2009 8:23 AM)  Physical Exam  Additional Exam:  Gen. Well-nourished, in no distress   Neck: JVD 1 cm above the clavicle, thyroid not enlarged, no carotid bruits Lungs: No tachypnea, clear without rales, rhonchi or wheezes Cardiovascular: Rhythm regular, PMI not displaced,  heart sounds  normal, no murmurs or gallops, no peripheral edema, pulses normal in all 4 extremities. Abdomen: BS normal, abdomen soft and non-tender without masses or organomegaly, no hepatosplenomegaly. MS: No deformities, no cyanosis or clubbing   Neuro:  No focal sns   Skin:  no lesions    Impression & Recommendations:  Problem # 1:  SYSTOLIC HEART FAILURE, CHRONIC (ICD-428.22) She is feeling well but she has slight jugular venous distention and has mild volume overload. We will increase her torsemide from 20 mg daily 240 mg daily. He has been a year since she had an  echocardiogram will plan to repeat that and get a BMP in one to 2 weeks. Her updated medication list for this problem includes:    Coreg 6.25 Mg Tabs (Carvedilol) .Marland Kitchen... 1 tab bid    Torsemide 20 Mg Tabs (Torsemide) .Marland Kitchen... Take two tablets by mouth daily.  Problem # 2:  CAD, AUTOLOGOUS BYPASS GRAFT (ICD-414.02) She has had no chest pain this problem appears stable. Her updated medication list for this problem includes:    Coreg 6.25 Mg Tabs (Carvedilol) .Marland Kitchen... 1 tab bid  Her updated medication list for this problem includes:    Coreg 6.25 Mg Tabs (Carvedilol) .Marland Kitchen... 1 tab bid  Problem # 3:  ATRIAL FIBRILLATION (ICD-427.31) Her heart  rhythm was irregular today but her ECG shows only APCs. We will make no changes in management. Her updated medication list for this problem includes:    Coreg 6.25 Mg Tabs (Carvedilol) .Marland Kitchen... 1 tab bid  Orders: EKG w/ Interpretation (93000) Echocardiogram (Echo)  Her updated medication list for this problem includes:    Coreg 6.25 Mg Tabs (Carvedilol) .Marland Kitchen... 1 tab bid  Patient Instructions: 1)  Your physician recommends that you schedule a follow-up appointment in: 4 months 2)  Your physician recommends that you return for lab work : the day of your echo- bmet/cbc (427.31;414.01) 3)  Your physician has recommended you make the following change in your medication: 1) increase torsemide to 40mg  once daily  4)  Your physician has requested that you have an echocardiogram.  Echocardiography is a painless test that uses sound waves to create images of your heart. It provides your doctor with information about the size and shape of your heart and how well your heart's chambers and valves are working.  This procedure takes approximately one hour. There are no restrictions for this procedure.

## 2010-12-14 NOTE — Progress Notes (Signed)
Summary: pt would like results   Phone Note Call from Patient Call back at Home Phone [phone]   Caller: Patient Reason for Call: Talk to Nurse, Lab or Test Results Summary of Call: pt would like results of echo and lab work that was done last week Initial call taken by: Shelda Pal,  January 04, 2010 9:33 AM  Follow-up for Phone Call        I spoke with the pt.  Follow-up by: Alvis Lemmings, RN, BSN,  January 04, 2010 12:47 PM

## 2010-12-14 NOTE — Progress Notes (Signed)
Summary: fax/ call pcp with test result  Phone Note Call from Patient Call back at Home Phone [phone]   Reason for Call: Talk to Nurse, Lab or Test Results Summary of Call: pt would like test result to be faxed over to her pcp.  dr. redding [phone]-office #  [phone]. Initial call taken by: Neil Crouch,  October 18, 2010 11:15 AM  Follow-up for Phone Call        Results were faxed to Dr. Lin Landsman on 10/13/10/  Pt is aware. Horton Chin RN

## 2010-12-14 NOTE — Letter (Signed)
Summary: [facility] - Office Note  [facility] - Office Note   Imported By: Marilynne Drivers 08/17/2010 07:55:26  _____________________________________________________________________  External Attachment:    Type:   Image     Comment:   External Document

## 2010-12-14 NOTE — Progress Notes (Signed)
Summary: HEADACH,CRAMPS IN LEGS,RINGING IN EAR  Medications Added TORSEMIDE 20 MG TABS (TORSEMIDE) Take one tablet by mouth once daily       Phone Note Call from Patient Call back at [facility] Phone [phone]   Caller: Patient Summary of Call: PT HAVE SIDE EFFECTS FROM  FUROSEMIDE [place] IN EAR,CRAMPS IN LEGS Initial call taken by: Delsa Sale,  January 11, 2010 8:32 AM  Follow-up for Phone Call        I discussed the pt's symptoms with Dr. Olevia Perches- ok to decrease torsemide back to 20mg  once daily. I called and spoke with the pt. She is aware to do this. She states her CBG's have been elevated in the 300's since increasing her torsemide and that she is having cramping. The pt's K+ level on her most recent bmet, after increasing her torsemide, was 4.3. I explained to the pt to decrease her torsemide and call us back in a week if her symptoms are not better. She states she has seen an ENT for tinnitus that she has had for a few months.  Follow-up by: Alvis Lemmings, RN, BSN,  January 11, 2010 1:45 PM    New/Updated Medications: TORSEMIDE 20 MG TABS (TORSEMIDE) Take one tablet by mouth once daily

## 2010-12-14 NOTE — Progress Notes (Signed)
Summary: c/o retaining fluids in right leg  Phone Note Call from Patient Call back at Home Phone [phone]   Caller: Patient Reason for Call: Talk to Nurse Summary of Call: c/o retaining fluid especially in the right leg where her knee bends. hot, warm to toch. would like to be seen.  Initial call taken by: Neil Crouch,  November 09, 2010 8:23 AM  Follow-up for Phone Call        Pt. stating she is retaining fluid in her feet, legs, and hands. No CP/SOB/palpitations.  Pt. did state that she is drinking too much water. Above her right knee, she notices it is hot and feels hard. They did remove her vein grafts from this leg but this was in 2009 for her CABG. She does not weigh herself daily. I advised her to take an extra Torsemide today and to call us if she begins having any SOB/ CP. I felt she should be seen by a cardiologist before the weekend. She did not want to see our PA so I have added her to Dr. Camillia Herter schedule for 12/30 @ 3:45pm. She understands. I also stressed the importance of weighing herself daily and following a low sodium and fluid restriction diet. Whitney Jannett Celestine RN  November 09, 2010 9:18 AM  Follow-up by: Whitney Jannett Celestine RN,  November 09, 2010 9:19 AM

## 2010-12-14 NOTE — Progress Notes (Signed)
Summary: REFILL   Phone Note Refill Request Message from:  Patient on December 29, 2009 9:05 AM  Refills Requested: Medication #1:  TORSEMIDE 20 MG TABS Take two tablets by mouth daily.   Supply Requested: 3 months [employer] [facility] # [phone] ACCT# [id number]   Method Requested: Fax to [facility] Initial call taken by: Darnell Level,  December 29, 2009 9:06 AM  Follow-up for Phone Call        Rx faxed to pharmacy Follow-up by: Lynden Ang,  December 29, 2009 10:11 AM    Prescriptions: TORSEMIDE 20 MG TABS (TORSEMIDE) Take two tablets by mouth daily.  #180 x 1   Entered by:   Lynden Ang   Authorized by:   Fatima Sanger, MD, [facility]   Signed by:   Lynden Ang on 12/29/2009   Method used:   Electronically to        [facility] (mail-order)       [place]  [address]       Ph: [phone]       Fax: [phone]   RxIDMW:[id number]

## 2010-12-14 NOTE — Progress Notes (Signed)
Summary: medication change  Medications Added GLYBURIDE 5 MG  TABS (GLYBURIDE) 4 tablets by mouth daily       Phone Note Call from Patient   Caller: Patient Reason for Call: Talk to Nurse, Talk to Doctor Summary of Call: pt just wanted to let Dr. Olevia Perches her PCP changed her glyduride from 2pills  a day to 4 pills aday Initial call taken by: Shelda Pal,  Mar 13, 2010 8:50 AM  Follow-up for Phone Call        I called the pt and made her aware we had received her message. I will update her medication list. Follow-up by: Alvis Lemmings, RN, BSN,  Mar 13, 2010 9:41 AM    New/Updated Medications: GLYBURIDE 5 MG  TABS (GLYBURIDE) 4 tablets by mouth daily

## 2010-12-14 NOTE — Assessment & Plan Note (Signed)
Summary: 4 month rov  Medications Added TORSEMIDE 20 MG TABS (TORSEMIDE) Take two tablet by mout daily LOSARTAN POTASSIUM 25 MG TABS (LOSARTAN POTASSIUM) Take 1 tablet by mouth once a day        Visit Type:  Follow-up Primary Provider:  [name]  CC:  leg cramps.  History of Present Illness: The patient is 70 years old and return for a four-month followup office visit for management of CAD and CHF. She had bypass surgery in 2009. Her ejection fraction improved from 30% to 45-50%. She has a history of systolic CHF. She had atrial fibrillation at the time of her surgery and was treated with amiodarone and Coumadin and is now off both drugs.  She says she's been doing well with her heart and has had no chest pain shortness of breath or swelling. She had an echocardiogram done in February which showed an ejection fraction of 45%.  Other problems include hyperlipidemia and diabetes.  She had a son and daughter-in-law who are living with her but they have left. She had problems with the daughter-in-law. Her son and daughter-in-law have now split.  Current Medications (verified): 1)  Coreg 6.25 Mg Tabs (Carvedilol) .Marland Kitchen.. 1 Tab Bid 2)  Glyburide 5 Mg  Tabs (Glyburide) .... 4 Tablets By Mouth Daily 3)  Torsemide 20 Mg Tabs (Torsemide) .... Take Two Tablet By [facility] Daily 4)  Potassium Chloride Crys Cr 20 Meq Cr-Tabs (Potassium Chloride Crys Cr) .... Take One Tablet By Mouth Daily 5)  Simvastatin 40 Mg Tabs (Simvastatin) .Marland Kitchen.. 1 Tab Daily 6)  Nitro .... Prn 7)  Stool Softner .... Prn 8)  Asa 81mg  .... 1 Tab Daily 9)  Losartan Potassium 25 Mg Tabs (Losartan Potassium) .... Take 1 Tablet By Mouth Once A Day  Allergies: 1)  ! Bactrim 2)  ! * Benazepril 3)  ! * Multigen 4)  ! * Spironolactone  Past History:  Past Medical History: Reviewed history from 01/31/2009 and no changes required. 1. Coronary artery disease status post coronary artery bypass graft     surgery in July 2009. 2.  Ischemic cardiomyopathy with ejection fraction of 30%, improved to     45-50% by a recent echo. 3. Systolic heart failure, now appears euvolemic. 4. Postoperative atrial fibrillation status post transesophageal     echocardiography-guided cardioversion, now holding sinus rhythm on     amiodarone and Coumadin. 5. Diabetes. 6. Hyperlipidemia.   Review of Systems       ROS is negative except as outlined in HPI.   Vital Signs:  Patient profile:   70 year old female Height:      69 inches Weight:      323 pounds BMI:     47.87 Pulse rate:   68 / minute Resp:     18 per minute BP sitting:   122 / 72  (left arm)  Vitals Entered By: Margaretmary Bayley CMA (April 18, 2010 8:41 AM)  Physical Exam  Additional Exam:  Gen. Well-nourished, in no distress   Neck: No JVD, thyroid not enlarged, no carotid bruits Lungs: No tachypnea, clear without rales, rhonchi or wheezes Cardiovascular: Rhythm irregular, PMI not displaced,  heart sounds  normal, no murmurs or gallops, no peripheral edema, pulses normal in all 4 extremities. Abdomen: BS normal, abdomen soft and non-tender without masses or organomegaly, no hepatosplenomegaly. MS: No deformities, no cyanosis or clubbing   Neuro:  No focal sns   Skin:  no lesions    Impression &  Recommendations:  Problem # 1:  CAD, AUTOLOGOUS BYPASS GRAFT (ICD-414.02) She had prior bypass surgery. She's had no recent chest pain. This is stable. Her updated medication list for this problem includes:    Coreg 6.25 Mg Tabs (Carvedilol) .Marland Kitchen... 1 tab bid  Orders: EKG w/ Interpretation (93000) TLB-BMP (Basic Metabolic Panel-BMET) (99991111) TLB-Magnesium (Mg) (83735-MG)  Problem # 2:  SYSTOLIC HEART FAILURE, CHRONIC (ICD-428.22) She has a history of systolic CHF. She appears euvolemic today. Her recent echocardiogram showed an ejection fraction of 45%. We will continue current medications. She has complained of some cramps so we'll get a BMP and magnesium level  today. Her updated medication list for this problem includes:    Coreg 6.25 Mg Tabs (Carvedilol) .Marland Kitchen... 1 tab bid    Torsemide 20 Mg Tabs (Torsemide) .Marland Kitchen... Take two tablet by mout daily    Losartan Potassium 25 Mg Tabs (Losartan potassium) .Marland Kitchen... Take 1 tablet by mouth once a day  Orders: EKG w/ Interpretation (93000) TLB-BMP (Basic Metabolic Panel-BMET) (99991111) TLB-Magnesium (Mg) (83735-MG)  Problem # 3:  ATRIAL FIBRILLATION (ICD-427.31) She had a prior history of paroxysmal atrial fibrillation. She is now off amiodarone and Coumadin. She has had no recurrence. She did have some APCs on her ECG today but no atrial fibrillation. Her updated medication list for this problem includes:    Coreg 6.25 Mg Tabs (Carvedilol) .Marland Kitchen... 1 tab bid  Orders: EKG w/ Interpretation (93000) TLB-BMP (Basic Metabolic Panel-BMET) (99991111) TLB-Magnesium (Mg) (83735-MG)  Problem # 4:  HYPERLIPIDEMIA (ICD-272.4) This is currently managed with simvastatin. Her updated medication list for this problem includes:    Simvastatin 40 Mg Tabs (Simvastatin) .Marland Kitchen... 1 tab daily  Patient Instructions: 1)  Your physician recommends that you have lab work today: bmet/magnesium (428.22;414.01;427.31) 2)  Your physician wants you to follow-up in: 6 months.  You will receive a reminder letter in the mail two months in advance. If you don't receive a letter, please call our office to schedule the follow-up appointment. 3)  Your physician recommends that you continue on your current medications as directed. Please refer to the Current Medication list given to you today.

## 2010-12-14 NOTE — Progress Notes (Signed)
Summary: B/P 117/59 heart rate 65  Phone Note Call from Patient Call back at [facility] Phone [phone]   Caller: Patient Reason for Call: Talk to Nurse Summary of Call: B/P 117/59  heart rate 65 Initial call taken by: Delsa Sale,  November 09, 2010 11:43 AM  Follow-up for Phone Call        BP 117/59. Pt was concerned that this was too low. Advised her that this is okay.  Whitney Jannett Celestine RN  November 09, 2010 1:32 PM  Follow-up by: Whitney Jannett Celestine RN,  November 09, 2010 1:32 PM

## 2010-12-14 NOTE — Progress Notes (Signed)
Summary: pt needs refill   Phone Note Refill Request Call back at Home Phone [phone] Message from:  Patient [facility]  Refills Requested: Medication #1:  GLYBURIDE 5 MG  TABS 4 tablets by mouth daily Initial call taken by: Shelda Pal,  June 05, 2010 9:06 AM  Follow-up for Phone Call        I called the pt and made her aware she will need to contact her PCP to refill. Follow-up by: Alvis Lemmings, RN, BSN,  June 05, 2010 4:00 PM

## 2011-03-27 NOTE — H&P (Signed)
[name], [name] NO.:  [id number]   MEDICAL RECORD NO.:  [id number]          PATIENT TYPE:  OIB   LOCATION:  1963                         FACILITY:  [facility]   PHYSICIAN:  Bruce R. Olevia Perches, MD, FACCDATE OF BIRTH:  April 13, 1941   DATE OF ADMISSION:  05/12/2008  DATE OF DISCHARGE:  05/12/2008                              HISTORY & PHYSICAL   PRIMARY CARDIOLOGIST:  Vanna Scotland. Olevia Perches, MD, [facility]   PRIMARY CARE Olia Hinderliter:  Dr. Dimas Alexandria.   PATIENT PROFILE:  A 70 year old female with history of progressive  dyspnea, edema, and chest pain who was recently seen by Dr. Olevia Perches in  the office and is being admitted for further evaluation and management  of multi-vessel coronary artery disease and CHF.   PROBLEMS:  1. Coronary artery disease.      a.     Status post right and left heart cardiac catheterization       today revealing significant multi-vessel disease requiring       surgical consultation.  Right heart catheterization showed mildly       elevated right heart pressures with a wedge of 38.  EF was       approximately 20%.  2. History of tachycardia atrial premature contractions.  3. Hyperlipidemia.  4. Diabetes mellitus.  5. Anxiety.   ALLERGIES:  No known drug allergies.   HISTORY OF PRESENT ILLNESS:  A 70 year old female without prior cardiac  history.  She was recently evaluated at [facility] in May 2009  secondary to dyspnea and chest tightness.  She was noted on ECG to be in  sinus tachycardia with frequent PACs.  She was apparently discharged  from [facility] and subsequently set up to see Dr. Olevia Perches on May 06, 2008.  In the office, she was felt to have mild volume overload with  elevated neck veins and her story was felt to be concerning for unstable  angina.  She was placed on oral Lasix and aspirin therapy that day and  set up for right and left heart cardiac catheterization which took place  this morning.  Catheterization today has shown  multi-vessel coronary  artery disease with elevated wedge and right heart pressures and reduced  EF of approximately 20%.  The patient is being admitted for CHF  management and surgical evaluation.   HOME MEDICATIONS:  1. Glyburide 5 mg daily.  2. Atenolol/HCTZ 100/25 mg daily.  3. Metformin 1 tablet b.i.d.  4. Alprazolam 0.25 mg b.i.d.  5. Magnesium daily.   FAMILY HISTORY:  Negative for cardiovascular disease.  Mother died at 4  with cerebral hemorrhage.  Father died at 29 of emphysema.  She had a  brother who died at 29 with lung cancer.   SOCIAL HISTORY:  She lives in [place] with her husband.  She denies  any tobacco, alcohol, or drug use.  She is not routinely exercising.   REVIEW OF SYSTEMS:  Positive for exertional dyspnea, exertional chest  discomfort, and fatigue.  She has also had increasing lower extremity  edema and occasional palpations.   PHYSICAL EXAMINATION:  VITAL  SIGNS:  Blood pressure 114/70, respirations  18, heart rate 67, and afebrile.  GENERAL:  Pleasant female in no acute distress.  Awake, alert, and  oriented x3.  HEENT:  Normal.  NEURO:  Grossly intact, nonfocal.  SKIN:  Warm, dry without lesion or mass.  NECK:  No bruits with JVP at the jaw.  LUNGS:  Respirations are regular and labored.  Clear to auscultation.  CARDIAC:  Regular S1 and S2.  No S3 and S4.  There is 2/6 systolic  murmur at the left lower sternal border.  ABDOMEN:  Round, soft, nontender, and distended.  Bowel sounds present  x4.  EXTREMITIES:  Warm and dry.  No clubbing, cyanosis, with 1+ bilateral  extremity edema.  Distal pulses are intact.  Cath site is without  bleeding, bruising, or hematoma.   ASSESSMENT AND PLAN:  1. Unstable angina/coronary artery disease.  We will plan to admit the      patient for surgical evaluation.  She will maintain on aspirin,      beta-blocker, and add statin therapy.  Eventual cardiac rehab.  2. Acute systolic congestive heart failure/ischemic  cardiomyopathy.      As above, the patient will be admitted today.  She received 80 mg      of IV Lasix in the cath lab secondary to markedly elevated wedge      pressure.  We will plan to increase her oral dose of Lasix 40      b.i.d.  We will also add potassium.  With history of diabetes and      now cardiomyopathy, we will add low-dose ACE inhibitor therapy and      titrate as tolerated.  We will follow renal function closely.  We      will also discontinue atenolol in favor of carvedilol.  The patient      will require repeat LV EF evaluation approximately 3 months      following revascularization to determine potential need for ICD      placement.  3. Diabetes.  Hold her metformin.  Continue glyburide and sliding      scale insulin and CBGs.  4. Hyperlipidemia.  Check lipids, LFTs adding high-dose statin      therapy.      Murray Hodgkins, ANP      Bruce R. Olevia Perches, MD, [facility]  Electronically Signed    CB/MEDQ  D:  05/12/2008  T:  05/12/2008  Job:  [id number]

## 2011-03-27 NOTE — Cardiovascular Report (Signed)
[name], [name] NO.:  [id number]   MEDICAL RECORD NO.:  [id number]          PATIENT TYPE:  INP   LOCATION:  2920                         FACILITY:  [facility]   PHYSICIAN:  Bruce R. Olevia Perches, MD, FACCDATE OF BIRTH:  [birth year]   DATE OF PROCEDURE:  05/12/2008  DATE OF DISCHARGE:                            CARDIAC CATHETERIZATION   CLINICAL HISTORY:  [name] is a 70 years old and was recently seen by  me in consultation for symptoms of shortness of breath.  She also has  had some chest pain.  She was thought to be in congestive heart failure  and was started on Lasix and she was scheduled for evaluation today for  right and left heart catheterization and coronary angiography.  She is a  non-insulin diabetic and does have known hyperlipidemia.   PROCEDURE:  The procedure was performed by the right femoral artery and  arterial sheath and 4-French preformed coronary catheters.  Right heart  catheterization was performed percutaneously through the right femoral  vein using a venous sheath and Swan-Ganz thermodilution catheter.  A  subclavian injection was performed to assess the intramammary artery for  its suitability for bypass grafting.  The patient tolerated the  procedure well and left the laboratory in satisfactory condition.   RESULTS:  The left main coronary artery:  The left main coronary artery  is free of disease.   Left anterior descending artery:  Left anterior descending artery gave  rise to two diagonal branch and two septal perforators.  The vessel was  diffusely irregular.  There was 90% stenosis in the mid LAD.  There was  95% stenosis in the second diagonal branch, which was a large vessel.   Circumflex artery:  The circumflex artery gave rise to a marginal branch  and two small posterolateral branches.  The marginal branch was  completely occluded near its origin and filled via bridging collaterals.   Right coronary artery:  The right  coronary artery is a moderately large  vessel that gave rise to a right ventricular branch, posterior  descending branch, and a small enlarged posterolateral branch.  There  was 95% stenosis in the mid right coronary artery.  There was 30%  stenosis in the distal right coronary artery, there was 70% stenosis  before the first posterolateral branch, and 90% stenosis before the  second large posterolateral branch.   Left ventriculogram:  The left ventriculogram upon RAO projection showed  global hypokinesis with an estimated ejection fraction of 20%.   Subclavian injection:  A subclavian injection showed no subclavian  stenosis and showed a patent internal mammary artery.   HEMODYNAMIC DATA:  The right atrial pressure was 20 mean.  Pulmonary  arterial pressure was 70/30 with mean of 48.  Pulmonary wedge pressure  was 32 mean.  The left ventricle pressure was 127/28.  The aortic  pressure was 127/58 with mean of 86.  The cardiac output/cardiac index  by Fick was 5.4/2.1 liters/minute/meter squared.  The pulmonary artery  saturation was 56% and the aortic saturation was 95%.   CONCLUSION:  1. Severe three-vessel  coronary artery disease with 90% stenosis in      the mid LAD, 95% stenosis in the second large diagonal branch of      the LAD, total occlusion of the marginal branch of circumflex      artery, and 95% stenosis in the mid-right coronary with 70 and 90%      stenosis in the first and second posterolateral branches of the      right coronary artery.  2. Severe left ventricular dysfunction with global hypokinesis with an      estimated fraction of 20%.  3. Elevated pulmonary artery pressure (73/30 with a mean of 48) and      elevated pulmonary wedge pressure of 32.   RECOMMENDATIONS:  The patient has severe three-vessel disease and has an  ischemic cardiomyopathy with congestive heart failure.  We will plan  admission for acute treatment of her heart failure since this is   decompensated.  I will obtain surgical consultation for surgical  revascularization.      Bruce Alfonso Patten Olevia Perches, MD, [facility]  Electronically Signed     BRB/MEDQ  D:  05/12/2008  T:  05/13/2008  Job:  [id number]   cc:   Dimas Alexandria  Cardiopulmonary Lab

## 2011-03-27 NOTE — Assessment & Plan Note (Signed)
OFFICE VISIT   [name], WIL[place]  DOB:  [birth year]                                        June 24, 2008  CHART #:  [id number]   The patient returns to the office today in followup after her coronary  artery bypass grafting done on May 17, 2008.  She is a 70 year old  female weighing over 345 pounds with severe ischemic cardiomyopathy with  ejection fraction 20%, who had a long postoperative course, but  ultimately was discharged home and since discharge, she is making good  progress.  Physical Therapy and Occupational Therapy has been working  with her at home in increasing her physical activity.  She was initially  just discharged home with a hospital bed because of mobility issues, but  now she has been getting around her house better and has returned the  hospital bed.  She did note that her medical doctor has adjusted her  diabetic medications and also found that she had developed a urinary  tract infection, which had been treated.  Considering her poor LV  function, she does not complain of significant symptoms of congestive  heart failure at this time.   On exam, her blood pressure is 126/75, pulse is 82, respiratory rate is  20, O2 sats 92% on room air.  Her sternum is stable and healing well.  The right vein and her harvest sites are healing well.  She still has  some edema, but the large size of her legs is making it very difficult  to determine how much edema there is though both legs are improved from  during her hospitalization.   A chest x-ray was performed that shows clear lung fields bilaterally.   She continues on Pacerone 200 mg once a day, glyburide 6 mg a day,  Coumadin as directed by the [facility], carvedilol 6.25 mg a day,  aspirin 81 mg a day, Lasix 20 mg a day, spironolactone 25 mg a day,  Bactrim DS one p.o. b.i.d., Crestor 40 mg a day.   Considering her significant obesity and difficulty with mobility, she  seems to be  making very good progress since discharged home.  She is to  see Dr. Olevia Perches next week.  I plan to see her back in one month.   Lanelle Bal, MD  Electronically Signed   EG/MEDQ  D:  06/24/2008  T:  06/25/2008  Job:  [id number]   cc:   Vanna Scotland. Olevia Perches, MD, [facility]

## 2011-03-27 NOTE — Discharge Summary (Signed)
NAMEIDY, JAGIELLO NO.:  [id number]   MEDICAL RECORD NO.:  [id number]          PATIENT TYPE:  INP   LOCATION:  2006                         FACILITY:  [facility]   PHYSICIAN:  Lanelle Bal, MD    DATE OF BIRTH:  January 15, 1941   DATE OF ADMISSION:  05/12/2008  DATE OF DISCHARGE:  06/10/2008                               DISCHARGE SUMMARY   ADDENDUM   The patient was tentatively ready for discharge home on June 09, 2008.  Discharge was held secondary to increase in her creatinine.  It went  from 1.64 to 1.79.  The patient did have a hospital course of acute  renal insufficiency, and it was felt that we needed to follow this prior  to sending the patient home.  It was repeated on June 10, 2008, and went  down slightly to 1.74, and it was felt that this was stable for the  patient.  No other changes noted.  She was subsequently stable and ready  for discharge home on June 10, 2008.  She did have a BMP done prior to  discharge showing sodium of 137, potassium 3.4, chloride of 96,  bicarbonate 32, BUN 17, creatinine of 1.74, and glucose of 88.   For details of the patient's hospital course and followup appointments  and instructions please see dictated discharge summary.   DISCHARGE MEDICATIONS:  New med form is noted on chart.  Please see  medications as dictated in previous discharge summary.      Darlin Coco, [place]      Lanelle Bal, MD  Electronically Signed    KMD/MEDQ  D:  07/01/2008  T:  07/02/2008  Job:  [id number]   cc:   Vanna Scotland. Olevia Perches, MD, [facility]

## 2011-03-27 NOTE — Assessment & Plan Note (Signed)
[place] OFFICE NOTE   NAME:Popoca, ROSANNA BLANKLEY                       MRN:          [id number]  DATE:05/06/2008                            DOB:          09-09-1941    REFERRING PHYSICIAN:  Dr. Dimas Alexandria   REASON FOR REFERRAL:  Evaluation of cardiac arrhythmia and shortness of  breath.   CLINICAL HISTORY:  Ms. Dahman is a 70 year old is known to me through  her husband who is the patient of mine.  On Mother's day, she had an  episode where she developed some chest tightness, checked her blood  pressure and pulse, and found her pulse rate was 110.  Two days later,  she was seen at [facility] and an EKG there showed cardiac  arrhythmia with APCs and a borderline prolonged QT interval.  She was  referred to [place] for further evaluation.   Ms. Wehrmann also says that she has had increasing shortness of breath  over the last several months and increasing edema over the last several  months.  She has had only occasional chest pain and she has not had any  palpitations, although she has a documentation of fast heart rates  greater than 100 on several occasions.   Her past medical history is significant for type 2 diabetes,  hypertension, and degenerative disease of the lumbar spine, which was  documented with MRI showing spinal stenosis and disk protrusion.   FAMILY HISTORY:  Negative for cardiovascular disease.  Her mother died  at 30 of cerebral hemorrhage and father died at 51 of emphysema and she  has a brother who died at 49 from lung cancer.   SOCIAL HISTORY:  She lives with her husband.  She does not smoke.   REVIEW OF SYSTEMS:  Positive for symptoms of fatigue.  In addition to  the increasing edema of lower extremities over the past several months.   CURRENT MEDICATIONS:  1. Glyburide 6 mg daily.  2. Atenolol/hydrochlorothiazide 100/25 mg daily.  3. Metformin 1 tablet twice daily.  4.  Alprazolam 0.25 mg twice daily.  5. Magnesium.   PHYSICAL EXAMINATION:  VITAL SIGNS:  The blood pressure is 105/67, pulse  67 and regular.  NECK:  The venous pulsation visible up to the jaw.  Carotid pulses were  full.  I could hear no bruits.  CHEST:  Clear without rales or rhonchi.  CARDIAC:  Rhythm was mostly regular with some extrasystoles.  There is a  short [id number] systolic murmur, left sternal edge.  ABDOMEN:  The abdomen was protuberant.  The abdomen was soft with normal  bowel sounds.  There is no hepatosplenomegaly.  EXTREMITIES:  Showed 2+ edema bilaterally.  Pedal pulses were equal.  MUSCULOSKELETAL:  Showed no deformities.  SKIN:  Warm and dry.  NEUROLOGIC:  Showed no focal neurological signs.   LABORATORY DATA:  The ECG from [facility] showed  nonspecific ST-T changes.  The QT interval of 473 and APCs.  Her  electrocardiogram today showed nonspecific ST-T changes, poor R-wave  progression, and QTC of 442.  Her laboratory studies from [facility] showed total cholesterol of 175, HDL of 50, and LDL of  100.   IMPRESSION:  1. Shortness of breath elevated neck veins and peripheral edema      indicative of congestive heart failure of uncertain etiology.  2. Chest pain to rule out ischemic heart disease.  3. Documented tachycardia and atrial premature contractions.  4. Diabetes.  5. Hyperlipidemia.   RECOMMENDATIONS:  I think Ms. Rabanales has clinical evidence of congestive  heart failure.  I am not sure if this is systolic or diastolic.  We will  need further evaluation to determine this.  She also has a high-risk  profile with diabetes, excess weight, and hyperlipidemia and she has a  history of chest pain and could well have ischemic heart disease as  well.  We will plan to start her on Lasix 40 a day as well as 325 mg of  aspirin.  We will arrange for her to come in next week for right and  left heart catheterization to further define her  problem.  We will also  arrange to have a 24-hour monitor to assess her cardiac arrhythmia.  We  will make decisions about management after have these results available.  We will get laboratory studies as part of her precath evaluation.     Bruce Alfonso Patten Olevia Perches, MD, [facility]  Electronically Signed    BRB/MedQ  DD: 05/06/2008  DT: 05/07/2008  Job #: [id number]   cc:   Dimas Alexandria

## 2011-03-27 NOTE — Consult Note (Signed)
NAMEBRIENNE, POSTHUMUS NO.:  [id number]   MEDICAL RECORD NO.:  [id number]          PATIENT TYPE:  OIB   LOCATION:  1963                         FACILITY:  [facility]   PHYSICIAN:  Lanelle Bal, MD    DATE OF BIRTH:  05-May-1941   DATE OF CONSULTATION:  DATE OF DISCHARGE:  05/12/2008                                 CONSULTATION   REQUESTING PHYSICIAN:  Vanna Scotland. Olevia Perches, MD, [facility].   FOLLOW-UP CARDIOLOGIST:  Vanna Scotland Olevia Perches, MD, [facility].   PRIMARY CARE PHYSICIAN:  Dr. Dimas Alexandria   REASON FOR CONSULTATION:  Severe LV dysfunction, severe three-vessel  coronary artery disease, and obesity.   HISTORY OF PRESENT ILLNESS:  The patient is a 70 year old female with no  documented previous cardiac history.  She denies any prior history of  myocardial infarction, but has had at least 6-8 months of increasing  dyspnea on exertion, nocturnal dyspnea.  She notes that over the past  month, she spends most of the time sleeping in the chair because she is  unable to lay down flat.  She was seen several days ago in the [facility] and was brought in today for cardiac catheterization.  As noted  she has had no prior known history of myocardial infarction,  angioplasty, or previous cardiac history.  She does have a history of  hypertension, hyperlipidemia, type 2 diabetes since 1994, hemoglobin A1c  is unknown.  She checks her glucose several times a week and is usually  between 125-139.  She has had no previous stroke.  She does have chronic  peripheral vascular disease with claudication, denies renal  insufficiency.  Past surgical history includes repair of ventral hernia  with mesh in December 2000, right total knee replacement in 1994,  appendectomy at age 37.  She has chronic back pain and has had 3  radiologic procedures procedures for degenerative disk disease in 2007.   SOCIAL AND FAMILY HISTORY:  The patient's lives in [place], lives with  her husband who also has known  coronary artery disease and poor LV  function.  He had bypass surgery in 1983 and subsequently had a  pacemaker revised to AICD with known ejection fraction of 20-25%.  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  Currently she is barely able to walk around her house, denies  alcohol use, is a nonsmoker.  Family history is significant for father  died at age 14 of emphysema, mother died at age 10 with cerebral  hemorrhage, one brother died at 96 with lung cancer.   CURRENT MEDICATIONS:  Include,  1. Aspirin 325 mg.  2. Atenolol, hydrochlorothiazide 60/25 daily.  3. Metformin 1 tablet twice a day.  4. Alprazolam 0.25 mg twice a day.  5. Magnesium daily p.r.n.  6. Aleve, glyburide 6 mg daily.  7. Just started on Lasix several days ago.   ALLERGIES:  She has no known drug allergies.   Cardiac review of systems is positive for occasional chest pain,  exertional shortness of breath to significant degrees,  severe orthopnea,  increasing fatigue since last September, history of palpitations, severe  lower extremity edema.   GENERAL REVIEW OF SYSTEMS:  The patient denies fever, chills or night  sweats, does have significant fatigue.  Respiratory as noted above.  There is no history of hemoptysis.  Gastrointestinal, denies any change  in bowel habits or blood in her stool or urine.  Neurologic, denies  neuropathy related to diabetes, denies amaurosis or TIAs.  Endocrine,  noted above, has history of diabetes.  The patient notes that she is  claustrophobic.  Other review of systems are negative.  She does wear  glasses for reading.   On physical exam, the patient's blood pressure 118/56, pulse is 58,  respiratory rate is 13, O2 sats 96%.  She is 5 feet 9 inches tall, 347  pounds.  The patient obviously is morbidly obese.  She is a pleasant  lady and able to relate her history without difficulty.  She is  neurologically intact.  Pupils are equal,  round, reactive to light.  Neck is without carotid bruits.  Lungs are without active wheezing,  though because of her size, breath sounds are distant.  Cardiac sounds  are muffled.  Hear a normal S1-S2, do not hear gallop or murmur of  mitral insufficiency.  Abdominal exam shows a well-healed midline  abdominal incision.  Abdomen is markedly obese and unable to palpate any  organs.  Right groin site is without significant hematoma.  She has  massive edema in both lower extremities with some red discoloration of  the skin probably dermatitis related to the edema and she has no  palpable pedal pulses in either leg, but because of the edema these  would be difficult to palpate.   LABORATORY FINDINGS:  Include a hemoglobin of 11.3, hematocrit of 34,  platelet count 217, INR is 1.1, PTT is 26, total cholesterol is 175, BUN  is 21, creatinine 1.0.  No chest x-ray has been done.  No echocardiogram  has been done.  Cardiac catheterization shows ejection fraction 20%, 90%  mid LAD, 95% second diagonal, this is actually larger than the LAD.  Circ is totally occluded.  There is a very thin  distal vessel filling,  right coronary artery has a mid 95% and 90% and a moderate sized  posterolateral branch.   IMPRESSION:  A 70 year old diabetic morbidly obese female with  significant edema of her lower extremities with active heart failure  symptoms who was diabetic and with significant coronary disease that is  not approachable with angioplasty.  I discussed with the patient's  coronary artery bypass grafting and I thank this is her best option;  however, she does need to be tinged up improve her heart failure  symptoms and she needs an echocardiogram to evaluate her mitral valve  and to get a closer look at her overall LV function.  She would not be  ready for bypass surgery this week and we see how she does with diuresis  and consider sometime next week.  The patient is agreeable with this  approach  and has had her questions answered.      Lanelle Bal, MD  Electronically Signed     EG/MEDQ  D:  05/12/2008  T:  05/13/2008  Job:  [id number]   cc:   Vanna Scotland. Olevia Perches, MD, [place] [facility]

## 2011-03-27 NOTE — Assessment & Plan Note (Signed)
[place]                            CARDIOLOGY OFFICE NOTE   NAME:Termini, SALETA MENCIA                       MRN:          [id number]  DATE:05/06/2008                            DOB:          1941/04/20    ADDENDUM     Bruce R. Olevia Perches, MD, [facility]     BRB/MedQ  DD: 05/06/2008  DT: 05/07/2008  Job #: [id number]   cc:   Dimas Alexandria

## 2011-03-27 NOTE — Assessment & Plan Note (Signed)
[facility]   NAME:  [name], BAUMANN                ACCOUNT NO.:  [id number]   MEDICAL RECORD NO.:  [id number]      DATE OF BIRTH:  01-07-1941   PHYSICIAN:  Denice Bors. Stanford Breed, MD, [facility] VISIT DATE:  06/07/2008                                   OFFICE VISIT   This is cardioversion of atrial fibrillation.  The patient was sedated  with Pentothal 75 mg intravenously by anesthesia.  Synchronized  cardioversion (120 joules, biphasic) resulted in normal sinus rhythm  with first-degree AV block and PACs.  There were no immediate  complications.  We would recommend continuing Coumadin.      Denice Bors Stanford Breed, MD, [facility]  Electronically Signed     BSC/MEDQ  D:  06/07/2008  T:  06/08/2008  Job:  [id number]

## 2011-03-27 NOTE — Assessment & Plan Note (Signed)
OFFICE VISIT   [name], [name]  DOB:  08-23-1941                                        July 22, 2008  CHART #:  [id number]   The patient returns to the office today after her coronary artery bypass  grafting with severe ischemic cardiomyopathy done on May 17, 2008.  Considering her weight preoperatively of over 345 pounds and severe  ischemic cardiomyopathy, she seems to be doing relatively well.  She is  doing rehabilitation at home.  Dr. Olevia Perches had tried to restart ACE  inhibitor, however this caused too much for drop in blood pressure and  she has had to hold it associated with some dizziness.  Overall, she  seems to be increasing her physical activity appropriately and has  compensated congestive heart failure.  Currently, concern today was  numbness in her small and ring finger bilaterally.  She has noted that  since surgery, but it has gradually been getting better.  She had never  mentioned that before, but the symptoms are classic for brachial stretch  and she does note that over the past 3 to 4 weeks, it is improving.   PHYSICAL EXAMINATION:  VITAL SIGNS:  Her blood pressure 134/80, pulse is  80, respiratory rate is 18, and O2 sats 98%.  CHEST:  Sternum is stable and well healed.  Her vein harvest sites are  also healing well.  EXTREMITIES:  The legs remained large, however the degree of edema has  markedly decreased.  Examination of the hands:  She does have some  weakness and squeezing the small and ring finger bilaterally and some  decrease in sensation in the ring and half of the ring finger  bilaterally.   IMPRESSION:  The patient's medication list was reviewed.  Dr. Olevia Perches  notes that in the coming months, he will stop her Coumadin and Pacerone.  She notes that her diuretic dose has been decreased.   Overall, I am very pleased with her progress, and I had encouraged her  to starting the cardiac rehabilitation program in the coming  weeks.  I  had not made a return appointment to see me, but would be glad to see  her at Dr. Nichola Sizer request at anytime.   [name] Bal, MD  Electronically Signed   EG/MEDQ  D:  07/22/2008  T:  07/22/2008  Job:  [id number]   cc:   Vanna Scotland. Olevia Perches, MD, [facility]  Lovette Cliche II, M.D.

## 2011-03-27 NOTE — Assessment & Plan Note (Signed)
[place]                            CARDIOLOGY OFFICE NOTE   NAME:WILSEYKadijha, Waldschmidt                       MRN:          [id number]  DATE:08/18/2008                            DOB:          08/01/1941    PRIMARY CARE PHYSICIAN:  Lovette Cliche, MD   CLINICAL HISTORY:  Ms. Livshits is 70 years old and returned for followup  management of her coronary heart disease and congestive heart failure.  She presented with congestive heart failure and was found to have severe  three-vessel disease and ejection fraction of 30%.  She underwent bypass  surgery by Dr. Servando Snare.  Postoperatively, she had marked volume  overload and atrial fibrillation.  She was diuresed and underwent TEE  cardioversion.  She has improved dramatically since that time.  She has  been involved in rehab program and has done quite well.  He has had no  recent palpitations or chest pain, does not feel she has had any fluid  retention.   Her past medical history is significant for diabetes and hyperlipidemia.   Her current medications include:  1. Torsemide 20 mg daily.  2. Spirolactone 25 mg daily.  3. Crestor 40 mg daily.  4. Coreg 6.25 mg b.i.d.  5. Coumadin.  6. Aspirin 81 mg.  7. Klor-Con 20 mEq b.i.d.  8. Pacerone 200 mg daily.  9. Metformin 500 mg b.i.d.  10.Alprazolam.  11.Glyburide.   PHYSICAL EXAMINATION:  VITAL SIGNS:  The blood pressure was 139/71 and  the pulse 61 and regular.  NECK:  There was no venous distension.  The carotid pulses were full  without bruits.  CHEST:  Clear.  HEART:  Rhythm was regular.  No murmurs or gallops.  ABDOMEN:  Soft and normal bowel sounds.  EXTREMITIES:  There was nonpitting edema in both lower extremities and  pedal pulses were equal.   Electrocardiogram showed sinus rhythm and nonspecific ST-T changes.   IMPRESSION:  1. Coronary artery status post recent bypass surgery for severe three-      vessel disease.  2. Ischemic  cardiomyopathy, ejection fraction 30%.  3. Systolic heart failure, now appears to be euvolemic.  4. Postoperative atrial fibrillation status post transesophageal      echocardiography-guided cardioversion now holding sinus rhythm on      amiodarone.  5. Diabetes.  6. Hyperlipidemia.  7. Hyperlipidemia.   RECOMMENDATIONS:  I think Ms. Moad is doing very well at present.  Her  atrial fibrillation was just postoperative, so I may be able to her get  off the amiodarone and Coumadin.  I will cut her amiodarone to 100 mg a  day.  Crestor is a problem because of the expense and then we will  switch to simvastatin 40.  We will get a CBC, BMP, lipid and liver and  hemoglobin A1c and a BMP.  I will plan to see her back in 2 months and  we will get an echocardiogram prior to that visit.  We will consider  getting her off the Coumadin and amiodarone on her followup visit.     Bruce Alfonso Patten  Olevia Perches, MD, [facility]  Electronically Signed    BRB/MedQ  DD: 08/18/2008  DT: 08/19/2008  Job #: [id number]

## 2011-03-27 NOTE — Assessment & Plan Note (Signed)
[place] OFFICE NOTE   NAME:WILSEYBrynlyn, Bafford                       MRN:          [id number]  DATE:10/21/2008                            DOB:          May 27, 1941    PRIMARY CARE PHYSICIAN:  Lovette Cliche, MD, in [place].   CLINICAL HISTORY:  Ms. Segoviano is 70 years old, who returned for followup  management of her coronary heart disease and congestive heart failure.  In July, she presented with a heart failure and an ejection fraction of  30% in the right cath and had severe three-vessel disease and one bypass  surgery by Dr. Servando Snare.  Postoperatively, she had atrial fibrillation  and finally underwent a TEE cardioversion for this.  She had marked  volume overload and diuresed about 50 pounds in the hospital and she has  lost an additional 30 pounds since that time.  She states she has been  doing quite well.  She had been in rehabilitation program and has been  fairly active and has been able to be moderately active.  She has had no  chest pain.   PAST MEDICAL HISTORY:  Significant for diabetes and hyperlipidemia.   CURRENT MEDICATIONS:  1. Torsemide.  2. Celexa.  3. Glyburide.  4. Simvastatin.  5. Pacerone 200 mg daily.  6. Potassium 20 mEq b.i.d.  7. Aspirin 81 mg daily.  8. Coumadin.  9. Spironolactone.   PHYSICAL EXAMINATION:  VITAL SIGNS:  The blood pressure is 172/61 and  pulse 55 and regular.  NECK:  There is no venous distension.  Carotid pulses were full without  bruits.  CHEST:  Clear.  HEART:  Rhythm was regular.  There is no murmurs, rubs, or gallops.  ABDOMEN:  Soft with normal bowel sounds.  There is no  hepatosplenomegaly.  EXTREMITIES:  There was trace peripheral edema.  Pedal pulses were  equal.   IMPRESSION:  1. Coronary artery disease status post coronary artery bypass graft      surgery in July 2009.  2. Ischemic cardiomyopathy with ejection fraction of 30%, improved to  45-50% by a recent echo.  3. Systolic heart failure, now appears euvolemic.  4. Postoperative atrial fibrillation status post transesophageal      echocardiography-guided cardioversion, now holding sinus rhythm on      amiodarone and Coumadin.  5. Diabetes.  6. Hyperlipidemia.   RECOMMENDATIONS:  I think Ms. Ridl is doing quite well.  She is on a  high dose of potassium considering her low dose of torsemide and the  fact that she is on spironolactone.  We will cut back from 20 twice a  day to 20 once a day.  We will get a BMP today and we will also get a  TSH for amiodarone surveillance.  She had recent cholesterol panel.   We will plan to see her back in 3 months.     Bruce Alfonso Patten Olevia Perches, MD, [facility]  Electronically Signed    BRB/MedQ  DD: 10/21/2008  DT: 10/21/2008  Job #: [id number]

## 2011-03-27 NOTE — Op Note (Signed)
[name] [name] NO.:  [id number]   MEDICAL RECORD NO.:  [id number]          PATIENT TYPE:  INP   LOCATION:  2307                         FACILITY:  [facility]   PHYSICIAN:  Lanelle Bal, MD    DATE OF BIRTH:  1941/09/22   DATE OF PROCEDURE:  05/17/2008  DATE OF DISCHARGE:                               OPERATIVE REPORT   PREOPERATIVE DIAGNOSIS:  Coronary occlusive disease with severe ischemic  cardiomyopathy.   POSTOPERATIVE DIAGNOSIS:  Coronary occlusive disease with severe  ischemic cardiomyopathy.   SURGICAL PROCEDURES:  Coronary artery bypass grafting x5 with left  internal mammary to the left anterior descending coronary artery and  diagonal coronary artery sequentially, reverse saphenous vein graft to  the circumflex coronary artery, sequential reverse saphenous vein graft  to the posterior descending posterolateral branches to the right  coronary artery with right leg endovein harvesting.   SURGEON:  Lanelle Bal, MD.   FIRST ASSISTANT:  Suzzanne Cloud, P.A.   BRIEF HISTORY:  The patient is a 70 year old morbidly obese female, over  345 pounds, who presents with severe ischemic cardiomyopathy.  Ejection  fraction estimated at 20% at the time of catheterization, and she is  also a known diabetic.  At the time of catheterization, the patient was  on subtotal occlusion of the LAD, high-grade stenosis greater than 80%  of the diagonal.  Total occlusion of his circumflex, coronary artery,  and high-grade stenosis of the proximal right and posterolateral  branches of the right.  Coronary artery bypass grafting was recommended.  The patient agreed and signed informed consent.   DESCRIPTION OF PROCEDURE:  With Swan-Ganz and arterial line monitors in  place, the patient underwent general endotracheal anesthesia without  incidence.  Skin of the chest and legs were prepped with Betadine and  draped in the usual sterile manner.  Using the [employer] endovein  harvesting system, vein was harvested from the right thigh and calf to  just above the ankle.  This proved to be extremely difficult because of  the patient's morbid obesity, but by the time it was very satisfactory.  A satisfactory vein was dissected out and the sternotomy was performed.  Left internal mammary artery was dissected down as pedicle graft.  Distal artery was divided and had good free flow.  Pericardium was  opened.  Overall ventricular function appeared markedly depressed.  TEE  showed diffuse hypokinesia especially in the anterior apical area.  The  patient did have a mild atrial regurgitation with the structurally  intact mitral leaflets on TEE.  No direct intervention to the mitral  valve was undertaken.  The patient was systemically heparinized.  The  ascending aorta and the right atrium were cannulated, the aortic root  vent cardioplegia needle was introduced into the ascending aorta.  The  patient was placed on cardiopulmonary bypass at 2.4 liters per minute  per meter squared.  Sites of anastomosis were selected and dissected out  of the epicardium.  The patient's body temperature was cooled to 30  degrees.  Aortic cross-clamp was applied and 500 mL of cold blood  potassium cardioplegia was administered with rapid diastolic arrest of  the heart.  Myocardial septal temperatures monitored throughout  crossclamp.   Attention was turned first to the circumflex coronary artery, which was  open and was 1.2-1.3 mm in size.  Using a running 7-0 Prolene, distal  anastomosis was performed.  Attention was then turned to the posterior  descending and posterolateral branches of the right coronary artery.  Posterior descending branch was opened and admitted a 1.5 mm probe.  Using a diamond-type side-to-side anastomosis with second reverse  saphenous vein graft with some procedure performed.  Distal extent of  the same vein was then carried to the posterolateral branch, which was   slightly smaller, but did admit a 1.5 mm probe.  Using a running 7-0  Prolene, distal anastomosis was performed.  Attention was then turned to  the diagonal LAD.  The mammary artery, which had been dissected out with  very good quality vessel and large, and it was decided to sequentially  up to the diagonal and the LAD.  Diagonal was actually bigger than the  LAD.  The diagonal vessels was opened and read at 1.5 mm probe.  Using a  longitudinal side-to-side anastomosis with a running 8-0 Prolene, the  mammary to diagonal anastomosis was performed.  Distal extent of the  mammary artery was then anastomosed through the mid portion of the LAD.  The LAD was a small vessel, 1.3 mm in size.  The bulldog mammary artery  was released and there was a rise in myocardial temperature and  anastomosis without bleeding.  The bulldog was placed back on the  mammary artery and the fascia of the mammary artery was tagged around  each of the anastomosis with the crossclamp still in place.  The 3-punch  aortotomies were performed.  Each with 3 vein grafts and anastomosed to  the ascending aorta.  Air was evacuated from the grafts and aortic  crossclamp was removed.  The total crossclamp time was 93 minutes.  The  patient spontaneously converted to a sinus rhythm.  She was started on  Melan-A and dopamine infusions.  She was then ventilated and weaned from  cardiopulmonary bypass and remained hemodynamically stable.  The degree  of mitral regurgitation appeared less, fairly trace with the dopamine  and Melan-A infusion.  She was separated from cardiopulmonary bypass  without difficulty and remained hemodynamically stable.  She was  decannulated in the usual fashion.  Protamine sulfate was administered  in the operative field.  Sites of anastomosis were inspected and free of  bleeding.  The patient was decannulated in usual fashion.  Protamine  sulfate was administered with operative field hemostatic.   Pericardium  was reapproximated, left pleural tube and a Blake mediastinal drain was  left in place.  Sternum was closed with #6 stainless steel wire.  Fascia  closed with interrupted 0 Vicryl, running 3-0 Vicryl, subcutaneous  tissue, and 4-0 subcuticular stitch in skin edges.  Dry dressings were  applied.  Sponge and needle counts were reported as correct at the  completion of procedure.  Total pump time was 130 minutes.  The patient  was transferred to surgical intensive care unit for further  postoperative care.      Lanelle Bal, MD  Electronically Signed     EG/MEDQ  D:  05/17/2008  T:  05/18/2008  Job:  [id number]   cc:   Vanna Scotland. Olevia Perches, MD, [facility]

## 2011-03-27 NOTE — Assessment & Plan Note (Signed)
[facility]                            CARDIOLOGY OFFICE NOTE   NAME:[name], [name]                       MRN:          [id number]  DATE:07/06/2008                            DOB:          1940-12-23    PRIMARY CARE PHYSICIAN:  Lovette Cliche, MD in [place].   PAST CLINICAL HISTORY:  [name] is 70 years old and was recently  admitted to the hospital with congestive heart failure and had a  catheterization, which showed severe three-vessel disease and ejection  fraction of 30%.  She underwent bypass surgery by Dr. Servando Snare.  Her  postoperative course was complicated by atrial fibrillation and was  subsequently required a TEE-guided cardioversion.  She also had a marked  volume overload with diuresed over a long period of time.  She also had  renal insufficiency.  She finally improved and was discharged several  weeks ago.  She has [name] quite well since that time and was lost another  18 pounds down to 282 pounds on her home scales.  She had no chest pain  and no palpitations.   Her past medical history is significant for diabetes and hyperlipidemia.   Her current medications include:  1. Glyburide.  2. Alprazolam.  3. Metformin.  4. Pacerone 200 mg daily.  5. Potassium chloride 20 mEq b.i.d.  6. Aspirin.  7. Coumadin.  8. Coreg 6.25 mg b.i.d.  9. Torsemide 20 mg b.i.d.  10.Spironolactone 25 mg daily.  11.Crestor 40 mg a day.   PHYSICAL EXAMINATION:  VITAL SIGNS:  The blood pressure is 127/75, pulse  is 77 and regular.  NECK:  There was no venous distention.  The carotid pulses were full  without bruits.  CHEST:  Clear without rales or rhonchi.  CARDIAC:  Rhythm is regular.  I could hear no murmurs or gallops.  ABDOMEN:  Soft with normal bowel sounds.  EXTREMITIES:  There was 1+ peripheral edema and some of which was  nonpitting.   IMPRESSION:  1. Status post recent bypass surgery for severe three-vessel disease.  2. Ischemic  cardiomyopathy, ejection fraction of 30%.  3. Systolic heart failure, now close to euvolemic.  4. Postoperative atrial fibrillation, status post TEE-guided      cardioversion, now holding sinus rhythm on amiodarone.  5. Diabetes.  6. Hyperlipidemia.   RECOMMENDATIONS:  I think [name] extremely well,  considering the extent of her problem.  We  told her she could come off  some of her medicines, but  I do not think she can do this yet.  She has  LV dysfunction, should be on an  ACE inhibitor.  We will add Lotensin 10  mg to her current medications.  Also, give her p.r.n. nitroglycerin.  We  get a BMP, BNP and CBC today.  I will plan to see her back in 6 weeks.  Since her atrial fibrillation occurred only postoperatively, I think we  can get her off the Pacerone, when she is out maybe 3 months for her  surgery and we might consider getting off of her Coumadin at  that time  too.      Bruce Alfonso Patten Olevia Perches, MD, [facility]  Electronically Signed    BRB/MedQ  DD: 07/06/2008  DT: 07/07/2008  Job #: [id number]

## 2011-03-27 NOTE — Discharge Summary (Signed)
[name], [name] NO.:  [id number]   MEDICAL RECORD NO.:  [id number]          PATIENT TYPE:  INP   LOCATION:  2006                         FACILITY:  [facility]   PHYSICIAN:  Lanelle Bal, MD    DATE OF BIRTH:  08-10-1941   DATE OF ADMISSION:  05/12/2008  DATE OF DISCHARGE:                               DISCHARGE SUMMARY   FINAL DIAGNOSES:  Coronary occlusive disease with severe ischemic  cardiomyopathy.   IN-HOSPITAL DIAGNOSES:  1. Postoperative atrial fibrillation.  2. Acute blood loss anemia postoperatively.  3. Volume overload postoperatively.  4. Acute renal insufficiency postoperatively.  5. Urinary tract infection positive E. coli.   SECONDARY DIAGNOSES:  1. History of tachycardia, atrial premature contractions.  2. Hyperlipidemia.  3. Diabetes mellitus.  4. Anxiety.   IN-HOSPITAL OPERATIONS AND PROCEDURES:  1. Cardiac catheterization.  2. Coronary artery bypass grafting x5 using the left internal mammary      artery to the left anterior descending coronary artery, diagonal      coronary artery sequentially, reverse saphenous vein graft to      circumflex coronary artery, sequential reverse saphenous vein graft      to posterior descending, and posterior lateral branches to the      right coronary artery with right leg vein harvesting.  3. Synchronized cardioversion.   PATIENT'S HISTORY AND PHYSICAL AND HOSPITAL COURSE:  The patient is a 70-  year-old morbidly obese female who weighs over 345 pounds who presents  with severe ischemic cardiomyopathy.  Ejection fraction was estimated  20% at time of catheterization.  The patient does have history of  diabetes mellitus.  Cardiac catheterization was performed on May 12, 2008, showing coronary occlusive disease with severe ischemic  cardiomyopathy.  Following cardiac catheterization, Dr. Servando Snare was  consulted.  Dr. Servando Snare discussed with the patient undergoing coronary  artery bypass grafting.   He discussed risks and benefits with the  patient.  The patient acknowledged understanding and agreed to proceed.  Surgery was scheduled for May 17, 2008.  Preoperatively, the patient did  undergo bilateral carotid duplex ultrasound showing no ICA stenosis.  She also had preoperative ABIs done showing on the right to be greater,  the one on the left to be 0.93.  The patient remained stable  preoperatively.  For details of the patient's past medical history and  physical exam, please see dictated H&P.   The patient was taken to the operating room on May 17, 2008, where she  underwent coronary bypass grafting x5 using left internal mammary artery  to left anterior descending coronary artery, and diagonal coronary  artery sequentially, reverse saphenous vein graft to circumflex coronary  artery, sequential reverse saphenous vein graft to posterior descending  and posterior lateral branch of the right coronary artery.  Right leg  endovein harvesting was done.  The patient tolerated this procedure well  and she was transferred to the intensive care unit in stable condition.  Postoperatively, the patient was noted to be hemodynamically stable.  She was extubated evening of surgery.  Postextubation, the patient was  noted to  be alert and oriented x4.  Neuro intact.  The patient's  postoperative course had several complications.  From a cardiac  standpoint, the patient was noted to be in normal sinus rhythm with A  pacing in the 90s postoperatively.  By postop day #2, she went into  rapid atrial fibrillation.  A pacing was turned off.  The patient was  started on IV amiodarone.  The evening of postop day, the patient's  heart rate dropped in the 40s.  IV amiodarone was discontinued and she  was placed back on pacers.  The patient's underlying rhythm was noted to  be junctional.  Amiodarone was held and she was continued on beta  blocker.  Unfortunately, the patient was back into rapid atrial   fibrillation on postop day 8.  EP was contacted for evaluation and  recommendations.  He was started on p.o. amiodarone as well as Coumadin  at this time.  The patient remained in atrial fibrillation.  Her heart  rate was eventually able to be rate controlled with amiodarone and  Coreg.  Daily PT/INRs were obtained.  Coumadin was adjusted  approximately to reach therapeutic levels.  Cardiology continued to  follow the patient during this time.  She continued to stay rate  controlled atrial fibrillation.  On July 26, Cardiology felt that it was  time for the patient to undergo cardioversion to persistent rate-  controlled AFib.  We performed TEE with direct cardioversion.  This was  discussed with the patient and she agreed to proceed.  It was scheduled  for June 07, 2008.  On June 07, 2008, Dr. Stanford Breed performed TEE with  synchronized cardioversion.  This resulted in conversion to normal sinus  rhythm.  The patient was stable and Coumadin order was given for that  night.  Following day, the patient remains in normal sinus rhythm.  She  is therapeutic with an INR of 2.6.  We will continue to monitor the  patient's heart rate.  During this time, the patient's blood pressure  was also monitored.  She remained stable.  As stated above, she is on  Coreg and tolerating well.   The patient had significant volume overload postoperatively.  She had  been started on diuretics with Lasix IV.  The patient's weight was  monitored.  Her lower extremity edema was also monitored.  Her  creatinine started to elevate and felt that we could change to oral  diuretics.  Unfortunately, the patient continued to have significant  volume overload on p.o. diuretics.  She was then essentially restarted  on IV Lasix and then discontinued again and started on Demadex and  Aldactone.  The patient's volume overload improved.  She is currently  now 8 kg below her preoperative weight.  Order for a duplex ultrasound  on  the right lower extremity has been ordered for today, June 08, 2008.  We will follow up on this.   Postoperatively, the patient's labs were followed.  She did have some  acute blood loss anemia initially following surgery.  This did require  several transfusions.  The patient's hemoglobin/hematocrit were  continued to be followed and stabilized.  The last H&H on July 28 is 8.4  and 25.7.  The patient is asymptomatic.  The patient's creatinine was  also monitored closely following surgery.  She did develop acute renal  insufficiency.  Her Glucophage was held secondary to renal  insufficiency.  Creatinine started to trend down returning to baseline  prior to discharge home.  Currently, last creatinine was 1.64 on June 08, 2008.  We will recheck in the a.m.  The patient did have some  leukocytosis postoperatively.  Urine culture was obtained and noted to  be positive for E. coli.  She was started on ciprofloxacin, and received  a 7-day course of this.  Leukocytosis improved.  Last white blood cell  count was 4.4.   The patient has a history diabetes mellitus.  Following surgery, her  blood sugars were followed closely.  She was started on Lantus insulin.  The patient's blood sugars remained elevated and she was restarted on  glyburide.  She was not restarted on her Glucophage as stated above  secondary to renal insufficiency.  The patient's blood sugars were  stable on Lantus insulin and glyburide.  We will plan to discharge home  on these medications.   Pulmonary standpoint.  The patient was able to be extubated following  surgery.  Postextubation, she was placed on nasal cannula.  The chest  tubes were discontinued in normal fashion.  Followup chest x-rays  remained stable.  The patient was using her incentive spirometer, was  able to be weaned off oxygen, sating greater than 90% on room air.  Pulmonary status was stable postoperatively.   On June 08, 2008, the patient was noted to  be afebrile.  She is in  normal sinus rhythm.  Blood pressure is stable.  O2 sats 94% on room  air.  Labs showed an INR of 2.6.  White blood cell count 4.4, hemoglobin  is 8.4, hematocrit 25.7, platelet count 242.  Sodium of 136, potassium  3.7, chloride of 98, bicarb of 31, BUN of 22, creatinine 1.64, and  glucose 101.  All incisions are healing well.  Except there is a left  chest tube site which has some mild serous drainage and mild erythema  surrounding.  We will continue to monitor this.  The patient  postoperatively has been up, walking well with physical therapy and  cardiac rehab.  We will plan to arrange for home health PT nurse..  The  patient is tentatively ready for discharge home in the a.m. pending  everything remains stable.   FOLLOW-UP APPOINTMENTS:  Follow-up appointment is arranged with Dr.  Servando Snare for June 24, 2008, at 12 noon p.m.  The patient will need to  obtain PMI chest x-ray 30 minutes prior to this appointment.  The  patient will need to follow up with Dr. Olevia Perches in 2 weeks.  She will  need to contact Dr. Joylene Draft office to make these arrangements.  Home  health nurse will draw PT/INR levels on June 10, 2008 and fax results to  Dr. Nichola Sizer office.   ACTIVITY:  Patient instructed no driving until released to do so, no  lifting over 10 pounds.  She is told to ambulate 3-4 times per day,  progress as tolerated, and continue her breathing exercises.  Home  health physical therapy will continue to work with the patient.   INCISIONAL CARE:  The patient is told she is allowed to shower, washing  her incisions, using soap and water.  She is to contact the office if  she develops any drainage or opening from any of her incision sites.   DIET:  The patient is educated on diet to be low-fat, low-salt as well  as diabetic diet.   DISCHARGE MEDICATIONS:  1. Glyburide 6 mg daily.  2. Xanax 0.25 mg p.r.n.  3. Crestor 40 mg at night.  4. Aspirin 81 mg daily.  5.  Coumadin will be dosed based on the patient's discharge PT/INR      levels.  6. Aldactone 25 mg daily.  7. Potassium chloride 40 mEq daily.  8. Amiodarone 200 mg daily.  9. Coreg 6.25 mg b.i.d.  10.Demadex 20 mg at night.  11.Demadex 40 mg in a.m.  12.Oxycodone 5 mg 1-2 tablets q.4-6 h. p.r.n.  13.Lantus insulin 30 units at night.      Darlin Coco, [place]      Lanelle Bal, MD  Electronically Signed    KMD/MEDQ  D:  06/08/2008  T:  06/08/2008  Job:  [id number]   cc:   Lanelle Bal, MD  Vanna Scotland Olevia Perches, MD, [facility]

## 2011-03-30 NOTE — Op Note (Signed)
[place]. [facility]  Patient:    [name]                        MRN: [id number] Proc. Date: 10/16/99 Adm. Date:  [id number] Attending:  Excell Seltzer Tappan                           Operative Report  PREOPERATIVE DIAGNOSIS:  Ventral hernia.  POSTOPERATIVE DIAGNOSIS:  Ventral hernia.  SURGICAL PROCEDURE:  Repair of ventral hernia with mesh.  SURGEON:  Darene Lamer. Hoxworth, M.D.  ANESTHESIA:  General.  ASSISTANT:  Jaci Carrel, M.D.  BRIEF HISTORY:  The patient is a 70 year old diabetic female with a progressively enlarging epigastric mass.  Exam reveals a large, chronically incarcerated midline upper abdominal hernia.  She has had no previous surgery.  She is overweight. Repair using mesh has been recommended and accepted.  The nature of the procedure, the indications and risks of bleeding, infection and recurrence were discussed nd understood.  She is now brought to the operating room for this procedure.  DESCRIPTION OF OPERATION:  Patient was brought to the operating room and placed in supine position on the operating table.  General endotracheal anesthesia was induced.  PAS hose were in place.  Broad-spectrum antibiotics were given intravenously.  A midline upper abdominal incision starting at the umbilicus was used and dissection carried down through subcutaneous tissue.  There was a large hernia sac present which was dissected free from surrounding subcutaneous tissue and freed down to the level of the fascia.  The fascial defect measured about 5 cm in diameter.  The hernia sac was opened and adhesions to the hernia sac lysed. The hernia contained omentum and transverse colon.  After all adhesions to the sac ere lysed, the hernia sac was excised and the fascial defect extended slightly to allow the hernia contents to be reduced back into the abdominal cavity.  The surrounding fascia was palpated and was intact except  for about a 1-cm umbilical hernia. Skin and subcutaneous flaps were raised about 5 cm in all directions, and a little more inferiorly, to encompass the umbilicus as well.  The umbilical fascial defect was closed with a couple of interrupted 0 Prolene sutures.  Full-thickness mattress 0 Prolene sutures were then placed at the periphery of the cleared fascia in preparation for placement of the mesh.  The primary defect was then closed transversely with interrupted 0 Prolene.  A piece of Prolene mesh was trimmed to about 13 x 13 cm and was used on an onlay graft, initially affixing it to the fascia with the previously placed mattress sutures of 0 Prolene, and then some further more superficial 0 Prolene at the periphery of the mesh; this appeared o effect a good secure repair with wide coverage and under no tension.  The wound was irrigated with antibiotic solution.  A closed suction drain was left on top of he mesh.  The skin was closed with staples.  Sponge, needle and instrument counts ere correct.  Dry sterile dressing was applied and the patient taken to recovery room in good condition. DD:  10/16/99 TD:  10/17/99 Job: [id number]

## 2011-04-19 ENCOUNTER — Encounter: Payer: Self-pay | Admitting: Cardiovascular Disease

## 2011-05-24 ENCOUNTER — Ambulatory Visit: Payer: Self-pay | Admitting: Cardiovascular Disease

## 2011-07-03 ENCOUNTER — Ambulatory Visit (INDEPENDENT_AMBULATORY_CARE_PROVIDER_SITE_OTHER): Payer: Medicare Other | Admitting: Cardiovascular Disease

## 2011-07-03 ENCOUNTER — Encounter: Payer: Self-pay | Admitting: Cardiovascular Disease

## 2011-07-03 VITALS — BP 120/70 | HR 92 | Ht 68.0 in | Wt 304.0 lb

## 2011-07-03 DIAGNOSIS — I2589 Other forms of chronic ischemic heart disease: Secondary | ICD-10-CM

## 2011-07-03 DIAGNOSIS — I2510 Atherosclerotic heart disease of native coronary artery without angina pectoris: Secondary | ICD-10-CM

## 2011-07-03 NOTE — Progress Notes (Signed)
History of Present Illness:70 yo WF with h/o CAD s/p CABG, systolic CHF, ischemic CM, HL, DM here today for cardiac follow up. She has been followed by Dr. Olevia Perches in the past. I saw her as an add on to my schedule in December 2011  for evaluation of swelling in both of her  legs.  She had bypass surgery in 2009. Her ejection fraction improved from 30% to 45-50%. Most recent LVEF by echo February 2011 45%. She has a history of systolic CHF. She had atrial fibrillation at the time of her surgery and was treated with amiodarone and Coumadin and is now off both drugs.  At the last visit, her diuretics were adjusted and her LE edema resolved.   She is here today for planned f/u. She denies any chest pain, SOB, palpitations, near syncope or syncope. She keeps busy with [employer]. She is going to have cataract surgery soon. She does not exercise because of back pain.   Her primary care doctor is Dr. Lovette Cliche in [place]. He follows her lipids.     Past Medical History  Diagnosis Date  . CAD (coronary artery disease) of artery bypass graft 05/2008    S/P CABG   . Ischemic cardiomyopathy     EF 30%, improved to 45-50% by a recent echo  . Systolic heart failure     Now appears euvolemic  . Atrial fibrillation     Postoperative, S/P transesophageal echocardiography-guided cardioversion, now holding sinus rhythm on Amiodarone and Coumadin  . Diabetes mellitus   . Hyperlipidemia     Past Surgical History  Procedure Date  . Ventral hernia repair 10/1999    With mesh  . Total knee arthroplasty 1994    Right  . Appendectomy     At age 49    Current Outpatient Prescriptions  Medication Sig Dispense Refill  . aspirin 81 MG EC tablet Take 81 mg by mouth daily.        . carvedilol (COREG) 6.25 MG tablet Take 6.25 mg by mouth 2 (two) times daily with a meal.        . cyanocobalamin 2000 MCG tablet Take 2,000 mcg by mouth daily.        Marland Kitchen docusate sodium (COLACE) 100 MG capsule Take 100 mg by  mouth daily as needed.        . glyBURIDE (DIABETA) 5 MG tablet Take 20 mg by mouth daily with breakfast.        . losartan (COZAAR) 25 MG tablet Take 25 mg by mouth daily.        . metFORMIN (GLUCOPHAGE) 500 MG tablet Take 1 tablet by mouth Twice daily.      . nitroGLYCERIN (NITROSTAT) 0.4 MG SL tablet Place 0.4 mg under the tongue every 5 (five) minutes as needed. May repeat for up to 3 doses.       . potassium chloride SA (K-DUR,KLOR-CON) 20 MEQ tablet Take 20 mEq by mouth daily.        . simvastatin (ZOCOR) 40 MG tablet Take 40 mg by mouth at bedtime.        . torsemide (DEMADEX) 20 MG tablet Take 40 mg by mouth daily.          Allergies  Allergen Reactions  . Multigen   . Spironolactone   . Sulfamethoxazole W/Trimethoprim     History   Social History  . Marital Status: Married    Spouse Name: N/A    Number of Children: N/A  .  Years of Education: N/A   Occupational History  . Not on file.   Social History Main Topics  . Smoking status: Unknown If Ever Smoked  . Smokeless tobacco: Not on file  . Alcohol Use: No  . Drug Use: No  . Sexually Active: Not on file   Other Topics Concern  . Not on file   Social History Narrative   Widowed[place] alone [place]    Family History  Problem Relation Age of Onset  . Clotting disorder Mother     Cerebral hemorrhage  . Emphysema Father     COD  . Lung cancer Brother   . Heart disease Neg Hx     Review of Systems:  As stated in the HPI and otherwise negative.   BP 120/70  Pulse 92  Ht 5\' 8"  (1.727 m)  Wt 304 lb (137.893 kg)  BMI 46.22 kg/m2  Physical Examination: General: Well developed, well nourished, NAD HEENT: OP clear, mucus membranes moist SKIN: warm, dry. No rashes. Neuro: No focal deficits Musculoskeletal: Muscle strength 5/5 all ext Psychiatric: Mood and affect normal Neck: No JVD, no carotid bruits, no thyromegaly, no lymphadenopathy. Lungs:Clear bilaterally, no  wheezes, rhonci, crackles Cardiovascular: Regular rate and rhythm. No murmurs, gallops or rubs. Abdomen:Soft. Bowel sounds present. Non-tender.  Extremities: No lower extremity edema. Pulses are 2 + in the bilateral DP/PT.

## 2011-07-03 NOTE — Assessment & Plan Note (Signed)
>>  ASSESSMENT AND PLAN FOR ACC/AHA STAGE C CONGESTIVE HEART FAILURE DUE TO ISCHEMIC CARDIOMYOPATHY (HCC) WRITTEN ON 07/03/2011  2:01 PM BY MCALHANY, CHRISTOPHER D, MD  Volume status ok. Continue current meds. No changes.

## 2011-07-03 NOTE — Assessment & Plan Note (Signed)
Stable No changes 

## 2011-07-03 NOTE — Assessment & Plan Note (Signed)
Volume status ok. Continue current meds. No changes.

## 2011-07-03 NOTE — Patient Instructions (Signed)
Your physician recommends that you schedule a follow-up appointment in: 6 months  

## 2011-07-20 ENCOUNTER — Other Ambulatory Visit: Payer: Self-pay | Admitting: Cardiovascular Disease

## 2011-08-09 LAB — URINE MICROSCOPIC-ADD ON

## 2011-08-09 LAB — URINALYSIS, ROUTINE W REFLEX MICROSCOPIC
Bilirubin Urine: NEGATIVE
Bilirubin Urine: NEGATIVE
Bilirubin Urine: NEGATIVE
Glucose, UA: NEGATIVE
Glucose, UA: NEGATIVE
Glucose, UA: NEGATIVE
Glucose, UA: NEGATIVE
Hgb urine dipstick: NEGATIVE
Ketones, ur: 15 — AB
Ketones, ur: NEGATIVE
Ketones, ur: NEGATIVE
Nitrite: NEGATIVE
Nitrite: NEGATIVE
Nitrite: NEGATIVE
Protein, ur: 100 — AB
Protein, ur: 30 — AB
Protein, ur: NEGATIVE
Specific Gravity, Urine: 1.012
Specific Gravity, Urine: 1.012
Specific Gravity, Urine: 1.013
Specific Gravity, Urine: 1.026
Urobilinogen, UA: 0.2
Urobilinogen, UA: 0.2
Urobilinogen, UA: 0.2
Urobilinogen, UA: 1
pH: 5
pH: 5.5
pH: 5.5
pH: 5.5

## 2011-08-09 LAB — LACTIC ACID, PLASMA: Lactic Acid, Venous: 2.1

## 2011-08-09 LAB — POCT I-STAT 3, ART BLOOD GAS (G3+)
Acid-Base Excess: 2
Acid-Base Excess: 4 — ABNORMAL HIGH
Acid-base deficit: 5 — ABNORMAL HIGH
Bicarbonate: 19 — ABNORMAL LOW
Bicarbonate: 24.1 — ABNORMAL HIGH
Bicarbonate: 26 — ABNORMAL HIGH
Bicarbonate: 27 — ABNORMAL HIGH
Bicarbonate: 27.3 — ABNORMAL HIGH
Bicarbonate: 27.7 — ABNORMAL HIGH
Bicarbonate: 28.9 — ABNORMAL HIGH
O2 Saturation: 92
Operator id: 283371
Operator id: 298181
Operator id: 3342
Operator id: 3342
Patient temperature: 35.3
Patient temperature: 36.5
Patient temperature: 98.6
TCO2: 27
TCO2: 29
TCO2: 30
pCO2 arterial: 40
pCO2 arterial: 41.5
pCO2 arterial: 42.3
pH, Arterial: 7.341 — ABNORMAL LOW
pH, Arterial: 7.357
pH, Arterial: 7.449 — ABNORMAL HIGH
pH, Arterial: 7.451 — ABNORMAL HIGH
pH, Arterial: 7.505 — ABNORMAL HIGH
pO2, Arterial: 57 — ABNORMAL LOW
pO2, Arterial: 64 — ABNORMAL LOW

## 2011-08-09 LAB — BASIC METABOLIC PANEL
BUN: 23
BUN: 26 — ABNORMAL HIGH
BUN: 26 — ABNORMAL HIGH
BUN: 30 — ABNORMAL HIGH
BUN: 31 — ABNORMAL HIGH
BUN: 34 — ABNORMAL HIGH
BUN: 36 — ABNORMAL HIGH
BUN: 37 — ABNORMAL HIGH
BUN: 39 — ABNORMAL HIGH
BUN: 40 — ABNORMAL HIGH
BUN: 42 — ABNORMAL HIGH
BUN: 45 — ABNORMAL HIGH
BUN: 45 — ABNORMAL HIGH
BUN: 47 — ABNORMAL HIGH
CO2: 20
CO2: 22
CO2: 22
CO2: 23
CO2: 23
CO2: 24
CO2: 24
CO2: 25
CO2: 25
CO2: 26
CO2: 27
CO2: 27
CO2: 28
Calcium: 7.7 — ABNORMAL LOW
Calcium: 7.8 — ABNORMAL LOW
Calcium: 7.8 — ABNORMAL LOW
Calcium: 7.8 — ABNORMAL LOW
Calcium: 7.9 — ABNORMAL LOW
Calcium: 8.2 — ABNORMAL LOW
Calcium: 8.3 — ABNORMAL LOW
Calcium: 8.3 — ABNORMAL LOW
Calcium: 8.4
Calcium: 8.5
Calcium: 8.7
Calcium: 8.7
Calcium: 9
Chloride: 100
Chloride: 93 — ABNORMAL LOW
Chloride: 93 — ABNORMAL LOW
Chloride: 94 — ABNORMAL LOW
Chloride: 94 — ABNORMAL LOW
Chloride: 96
Chloride: 96
Chloride: 96
Chloride: 98
Chloride: 98
Chloride: 98
Chloride: 98
Chloride: 98
Chloride: 99
Creatinine, Ser: 1.15
Creatinine, Ser: 1.3 — ABNORMAL HIGH
Creatinine, Ser: 1.63 — ABNORMAL HIGH
Creatinine, Ser: 2.08 — ABNORMAL HIGH
Creatinine, Ser: 2.2 — ABNORMAL HIGH
Creatinine, Ser: 2.22 — ABNORMAL HIGH
Creatinine, Ser: 2.33 — ABNORMAL HIGH
Creatinine, Ser: 2.6 — ABNORMAL HIGH
Creatinine, Ser: 2.66 — ABNORMAL HIGH
Creatinine, Ser: 2.77 — ABNORMAL HIGH
Creatinine, Ser: 2.79 — ABNORMAL HIGH
Creatinine, Ser: 2.88 — ABNORMAL HIGH
Creatinine, Ser: 3.01 — ABNORMAL HIGH
Creatinine, Ser: 3.06 — ABNORMAL HIGH
GFR calc Af Amer: 18 — ABNORMAL LOW
GFR calc Af Amer: 19 — ABNORMAL LOW
GFR calc Af Amer: 20 — ABNORMAL LOW
GFR calc Af Amer: 20 — ABNORMAL LOW
GFR calc Af Amer: 21 — ABNORMAL LOW
GFR calc Af Amer: 22 — ABNORMAL LOW
GFR calc Af Amer: 22 — ABNORMAL LOW
GFR calc Af Amer: 25 — ABNORMAL LOW
GFR calc Af Amer: 27 — ABNORMAL LOW
GFR calc Af Amer: 27 — ABNORMAL LOW
GFR calc Af Amer: 29 — ABNORMAL LOW
GFR calc Af Amer: 38 — ABNORMAL LOW
GFR calc Af Amer: 49 — ABNORMAL LOW
GFR calc non Af Amer: 15 — ABNORMAL LOW
GFR calc non Af Amer: 15 — ABNORMAL LOW
GFR calc non Af Amer: 16 — ABNORMAL LOW
GFR calc non Af Amer: 17 — ABNORMAL LOW
GFR calc non Af Amer: 17 — ABNORMAL LOW
GFR calc non Af Amer: 18 — ABNORMAL LOW
GFR calc non Af Amer: 18 — ABNORMAL LOW
GFR calc non Af Amer: 21 — ABNORMAL LOW
GFR calc non Af Amer: 22 — ABNORMAL LOW
GFR calc non Af Amer: 22 — ABNORMAL LOW
GFR calc non Af Amer: 24 — ABNORMAL LOW
GFR calc non Af Amer: 31 — ABNORMAL LOW
GFR calc non Af Amer: 47 — ABNORMAL LOW
Glucose, Bld: 118 — ABNORMAL HIGH
Glucose, Bld: 120 — ABNORMAL HIGH
Glucose, Bld: 152 — ABNORMAL HIGH
Glucose, Bld: 170 — ABNORMAL HIGH
Glucose, Bld: 172 — ABNORMAL HIGH
Glucose, Bld: 189 — ABNORMAL HIGH
Glucose, Bld: 191 — ABNORMAL HIGH
Glucose, Bld: 195 — ABNORMAL HIGH
Glucose, Bld: 229 — ABNORMAL HIGH
Glucose, Bld: 77
Glucose, Bld: 80
Glucose, Bld: 86
Glucose, Bld: 95
Glucose, Bld: 98
Potassium: 3.2 — ABNORMAL LOW
Potassium: 3.2 — ABNORMAL LOW
Potassium: 3.3 — ABNORMAL LOW
Potassium: 3.5
Potassium: 3.6
Potassium: 3.7
Potassium: 3.8
Potassium: 3.9
Potassium: 4.1
Potassium: 4.2
Potassium: 4.2
Potassium: 4.4
Potassium: 4.4
Potassium: 4.7
Sodium: 127 — ABNORMAL LOW
Sodium: 128 — ABNORMAL LOW
Sodium: 129 — ABNORMAL LOW
Sodium: 129 — ABNORMAL LOW
Sodium: 130 — ABNORMAL LOW
Sodium: 130 — ABNORMAL LOW
Sodium: 130 — ABNORMAL LOW
Sodium: 130 — ABNORMAL LOW
Sodium: 131 — ABNORMAL LOW
Sodium: 131 — ABNORMAL LOW
Sodium: 132 — ABNORMAL LOW
Sodium: 134 — ABNORMAL LOW

## 2011-08-09 LAB — CBC
HCT: 22.3 — ABNORMAL LOW
HCT: 22.8 — ABNORMAL LOW
HCT: 23.6 — ABNORMAL LOW
HCT: 23.7 — ABNORMAL LOW
HCT: 24.5 — ABNORMAL LOW
HCT: 25 — ABNORMAL LOW
HCT: 25.8 — ABNORMAL LOW
HCT: 25.9 — ABNORMAL LOW
HCT: 26 — ABNORMAL LOW
HCT: 26.1 — ABNORMAL LOW
HCT: 26.4 — ABNORMAL LOW
HCT: 26.5 — ABNORMAL LOW
HCT: 27.2 — ABNORMAL LOW
HCT: 27.5 — ABNORMAL LOW
Hemoglobin: 7.8 — CL
Hemoglobin: 8 — ABNORMAL LOW
Hemoglobin: 8 — ABNORMAL LOW
Hemoglobin: 8 — ABNORMAL LOW
Hemoglobin: 8.3 — ABNORMAL LOW
Hemoglobin: 8.3 — ABNORMAL LOW
Hemoglobin: 8.7 — ABNORMAL LOW
Hemoglobin: 8.7 — ABNORMAL LOW
Hemoglobin: 8.8 — ABNORMAL LOW
Hemoglobin: 8.9 — ABNORMAL LOW
Hemoglobin: 9 — ABNORMAL LOW
Hemoglobin: 9 — ABNORMAL LOW
Hemoglobin: 9.1 — ABNORMAL LOW
Hemoglobin: 9.2 — ABNORMAL LOW
MCHC: 33
MCHC: 33.1
MCHC: 33.5
MCHC: 33.5
MCHC: 33.6
MCHC: 33.7
MCHC: 33.7
MCHC: 33.7
MCHC: 34
MCHC: 34
MCHC: 34.2
MCHC: 34.2
MCHC: 34.2
MCHC: 34.4
MCHC: 34.8
MCV: 86.1
MCV: 86.2
MCV: 86.4
MCV: 86.4
MCV: 86.5
MCV: 86.7
MCV: 86.7
MCV: 86.8
MCV: 86.9
MCV: 87
MCV: 87
MCV: 87.4
MCV: 87.6
MCV: 87.8
Platelets: 135 — ABNORMAL LOW
Platelets: 137 — ABNORMAL LOW
Platelets: 137 — ABNORMAL LOW
Platelets: 139 — ABNORMAL LOW
Platelets: 144 — ABNORMAL LOW
Platelets: 146 — ABNORMAL LOW
Platelets: 157
Platelets: 173
Platelets: 190
Platelets: 196
Platelets: 214
Platelets: 216
Platelets: 240
Platelets: 280
Platelets: 320
RBC: 2.58 — ABNORMAL LOW
RBC: 2.64 — ABNORMAL LOW
RBC: 2.74 — ABNORMAL LOW
RBC: 2.74 — ABNORMAL LOW
RBC: 2.83 — ABNORMAL LOW
RBC: 2.86 — ABNORMAL LOW
RBC: 2.95 — ABNORMAL LOW
RBC: 2.98 — ABNORMAL LOW
RBC: 2.99 — ABNORMAL LOW
RBC: 3 — ABNORMAL LOW
RBC: 3.03 — ABNORMAL LOW
RBC: 3.06 — ABNORMAL LOW
RBC: 3.14 — ABNORMAL LOW
RBC: 3.16 — ABNORMAL LOW
RBC: 3.94
RDW: 14.8
RDW: 14.9
RDW: 14.9
RDW: 14.9
RDW: 15
RDW: 15.1
RDW: 15.1
RDW: 15.1
RDW: 15.1
RDW: 15.3
RDW: 15.3
RDW: 15.4
RDW: 15.4
RDW: 15.5
RDW: 15.5
WBC: 10.9 — ABNORMAL HIGH
WBC: 11.7 — ABNORMAL HIGH
WBC: 12.3 — ABNORMAL HIGH
WBC: 12.5 — ABNORMAL HIGH
WBC: 12.8 — ABNORMAL HIGH
WBC: 14 — ABNORMAL HIGH
WBC: 14.4 — ABNORMAL HIGH
WBC: 14.8 — ABNORMAL HIGH
WBC: 15.7 — ABNORMAL HIGH
WBC: 15.8 — ABNORMAL HIGH
WBC: 15.9 — ABNORMAL HIGH
WBC: 16.9 — ABNORMAL HIGH
WBC: 17.2 — ABNORMAL HIGH
WBC: 21.3 — ABNORMAL HIGH
WBC: 8.5

## 2011-08-09 LAB — NA AND K (SODIUM & POTASSIUM), RAND UR
Potassium Urine: 42
Sodium, Ur: <10

## 2011-08-09 LAB — URINE CULTURE: Colony Count: >=100000

## 2011-08-09 LAB — TYPE AND SCREEN: Antibody Screen: NEGATIVE

## 2011-08-09 LAB — LIPID PANEL
Cholesterol: 124
LDL Cholesterol: 118 — ABNORMAL HIGH
Triglycerides: 123

## 2011-08-09 LAB — POCT I-STAT 4, (NA,K, GLUC, HGB,HCT)
Glucose, Bld: 122 — ABNORMAL HIGH
Glucose, Bld: 158 — ABNORMAL HIGH
Glucose, Bld: 167 — ABNORMAL HIGH
Glucose, Bld: 195 — ABNORMAL HIGH
HCT: 20 — ABNORMAL LOW
HCT: 26 — ABNORMAL LOW
Hemoglobin: 10.2 — ABNORMAL LOW
Hemoglobin: 11.6 — ABNORMAL LOW
Hemoglobin: 6.8 — CL
Operator id: 298181
Operator id: 3342
Operator id: 3342
Operator id: 3342
Operator id: 3342
Potassium: 3.2 — ABNORMAL LOW
Potassium: 3.2 — ABNORMAL LOW
Potassium: 3.6
Potassium: 3.7
Potassium: 3.8
Sodium: 125 — ABNORMAL LOW
Sodium: 127 — ABNORMAL LOW

## 2011-08-09 LAB — COMPREHENSIVE METABOLIC PANEL
ALT: 14
AST: 20
CO2: 33 — ABNORMAL HIGH
Chloride: 91 — ABNORMAL LOW
GFR calc Af Amer: >60
GFR calc non Af Amer: 50 — ABNORMAL LOW
Sodium: 132 — ABNORMAL LOW
Total Bilirubin: 1

## 2011-08-09 LAB — APTT: aPTT: 42 — ABNORMAL HIGH

## 2011-08-09 LAB — HEMOGLOBIN AND HEMATOCRIT, BLOOD
HCT: 22.5 — ABNORMAL LOW
Hemoglobin: 7.6 — CL

## 2011-08-09 LAB — POCT I-STAT, CHEM 8
BUN: 21
BUN: 27 — ABNORMAL HIGH
Calcium, Ion: 1.27
Chloride: 96
Creatinine, Ser: 2.4 — ABNORMAL HIGH
Glucose, Bld: 112 — ABNORMAL HIGH
Glucose, Bld: 185 — ABNORMAL HIGH
Hemoglobin: 8.2 — ABNORMAL LOW
Potassium: 4.5

## 2011-08-09 LAB — CLOSTRIDIUM DIFFICILE EIA
C difficile Toxins A+B, EIA: NEGATIVE
C difficile Toxins A+B, EIA: NEGATIVE
C difficile Toxins A+B, EIA: NEGATIVE

## 2011-08-09 LAB — POCT I-STAT 3, VENOUS BLOOD GAS (G3P V)
Acid-Base Excess: 6 — ABNORMAL HIGH
Acid-base deficit: 3 — ABNORMAL HIGH
O2 Saturation: 82
Operator id: 221371
TCO2: 22
pCO2, Ven: 32.2 — ABNORMAL LOW
pCO2, Ven: 54.9 — ABNORMAL HIGH
pO2, Ven: 30

## 2011-08-09 LAB — PROTIME-INR
INR: 1.2
INR: 2 — ABNORMAL HIGH
Prothrombin Time: 16 — ABNORMAL HIGH
Prothrombin Time: 23.3 — ABNORMAL HIGH

## 2011-08-09 LAB — CREATININE, URINE, RANDOM: Creatinine, Urine: 118.5

## 2011-08-09 LAB — CULTURE, RESPIRATORY W GRAM STAIN: Culture: NORMAL

## 2011-08-09 LAB — B-NATRIURETIC PEPTIDE (CONVERTED LAB): Pro B Natriuretic peptide (BNP): 611 — ABNORMAL HIGH

## 2011-08-09 LAB — ABO/RH: ABO/RH(D): O POS

## 2011-08-09 LAB — MAGNESIUM
Magnesium: 2.5
Magnesium: 2.5
Magnesium: 2.6 — ABNORMAL HIGH

## 2011-08-09 LAB — EXPECTORATED SPUTUM ASSESSMENT W GRAM STAIN, RFLX TO RESP C

## 2011-08-09 LAB — HEMOGLOBIN A1C: Mean Plasma Glucose: 183

## 2011-08-09 LAB — POCT I-STAT GLUCOSE: Operator id: 221371

## 2011-08-09 LAB — PREPARE RBC (CROSSMATCH)

## 2011-08-09 LAB — TSH: TSH: 2.444 (ref 0.350–4.500)

## 2011-08-09 LAB — POTASSIUM: Potassium: 3.9

## 2011-08-10 LAB — BASIC METABOLIC PANEL
BUN: 17
BUN: 21
BUN: 22
BUN: 23
BUN: 23
BUN: 26 — ABNORMAL HIGH
BUN: 28 — ABNORMAL HIGH
BUN: 30 — ABNORMAL HIGH
BUN: 32 — ABNORMAL HIGH
BUN: 33 — ABNORMAL HIGH
BUN: 34 — ABNORMAL HIGH
BUN: 34 — ABNORMAL HIGH
BUN: 34 — ABNORMAL HIGH
BUN: 35 — ABNORMAL HIGH
BUN: 37 — ABNORMAL HIGH
CO2: 25
CO2: 27
CO2: 28
CO2: 29
CO2: 29
CO2: 29
CO2: 29
CO2: 30
CO2: 30
CO2: 30
CO2: 30
CO2: 30
CO2: 31
CO2: 31
CO2: 32
CO2: 33 — ABNORMAL HIGH
Calcium: 8.2 — ABNORMAL LOW
Calcium: 8.4
Calcium: 8.4
Calcium: 8.5
Calcium: 8.5
Calcium: 8.5
Calcium: 8.6
Calcium: 8.6
Calcium: 8.6
Calcium: 8.7
Calcium: 8.7
Calcium: 8.7
Calcium: 8.8
Calcium: 8.8
Chloride: 93 — ABNORMAL LOW
Chloride: 94 — ABNORMAL LOW
Chloride: 94 — ABNORMAL LOW
Chloride: 94 — ABNORMAL LOW
Chloride: 95 — ABNORMAL LOW
Chloride: 95 — ABNORMAL LOW
Chloride: 95 — ABNORMAL LOW
Chloride: 96
Chloride: 96
Chloride: 96
Chloride: 97
Chloride: 97
Chloride: 97
Chloride: 97
Chloride: 98
Chloride: 98
Creatinine, Ser: 1.55 — ABNORMAL HIGH
Creatinine, Ser: 1.58 — ABNORMAL HIGH
Creatinine, Ser: 1.58 — ABNORMAL HIGH
Creatinine, Ser: 1.58 — ABNORMAL HIGH
Creatinine, Ser: 1.64 — ABNORMAL HIGH
Creatinine, Ser: 1.66 — ABNORMAL HIGH
Creatinine, Ser: 1.71 — ABNORMAL HIGH
Creatinine, Ser: 1.74 — ABNORMAL HIGH
Creatinine, Ser: 1.79 — ABNORMAL HIGH
Creatinine, Ser: 1.82 — ABNORMAL HIGH
Creatinine, Ser: 1.93 — ABNORMAL HIGH
Creatinine, Ser: 1.99 — ABNORMAL HIGH
Creatinine, Ser: 2 — ABNORMAL HIGH
Creatinine, Ser: 2.01 — ABNORMAL HIGH
Creatinine, Ser: 2.04 — ABNORMAL HIGH
Creatinine, Ser: 2.05 — ABNORMAL HIGH
GFR calc Af Amer: 29 — ABNORMAL LOW
GFR calc Af Amer: 29 — ABNORMAL LOW
GFR calc Af Amer: 30 — ABNORMAL LOW
GFR calc Af Amer: 30 — ABNORMAL LOW
GFR calc Af Amer: 30 — ABNORMAL LOW
GFR calc Af Amer: 31 — ABNORMAL LOW
GFR calc Af Amer: 33 — ABNORMAL LOW
GFR calc Af Amer: 34 — ABNORMAL LOW
GFR calc Af Amer: 35 — ABNORMAL LOW
GFR calc Af Amer: 36 — ABNORMAL LOW
GFR calc Af Amer: 37 — ABNORMAL LOW
GFR calc Af Amer: 38 — ABNORMAL LOW
GFR calc Af Amer: 39 — ABNORMAL LOW
GFR calc Af Amer: 39 — ABNORMAL LOW
GFR calc Af Amer: 39 — ABNORMAL LOW
GFR calc Af Amer: 40 — ABNORMAL LOW
GFR calc non Af Amer: 24 — ABNORMAL LOW
GFR calc non Af Amer: 24 — ABNORMAL LOW
GFR calc non Af Amer: 25 — ABNORMAL LOW
GFR calc non Af Amer: 25 — ABNORMAL LOW
GFR calc non Af Amer: 25 — ABNORMAL LOW
GFR calc non Af Amer: 26 — ABNORMAL LOW
GFR calc non Af Amer: 28 — ABNORMAL LOW
GFR calc non Af Amer: 29 — ABNORMAL LOW
GFR calc non Af Amer: 31 — ABNORMAL LOW
GFR calc non Af Amer: 31 — ABNORMAL LOW
GFR calc non Af Amer: 32 — ABNORMAL LOW
GFR calc non Af Amer: 32 — ABNORMAL LOW
GFR calc non Af Amer: 32 — ABNORMAL LOW
GFR calc non Af Amer: 33 — ABNORMAL LOW
Glucose, Bld: 100 — ABNORMAL HIGH
Glucose, Bld: 104 — ABNORMAL HIGH
Glucose, Bld: 109 — ABNORMAL HIGH
Glucose, Bld: 111 — ABNORMAL HIGH
Glucose, Bld: 128 — ABNORMAL HIGH
Glucose, Bld: 143 — ABNORMAL HIGH
Glucose, Bld: 68 — ABNORMAL LOW
Glucose, Bld: 70
Glucose, Bld: 71
Glucose, Bld: 77
Glucose, Bld: 87
Glucose, Bld: 88
Glucose, Bld: 93
Glucose, Bld: 94
Glucose, Bld: 98
Potassium: 2.9 — ABNORMAL LOW
Potassium: 3.3 — ABNORMAL LOW
Potassium: 3.3 — ABNORMAL LOW
Potassium: 3.3 — ABNORMAL LOW
Potassium: 3.3 — ABNORMAL LOW
Potassium: 3.4 — ABNORMAL LOW
Potassium: 3.4 — ABNORMAL LOW
Potassium: 3.6
Potassium: 3.6
Potassium: 3.6
Potassium: 3.6
Potassium: 3.6
Potassium: 3.7
Potassium: 3.8
Potassium: 3.8
Potassium: 4
Sodium: 130 — ABNORMAL LOW
Sodium: 130 — ABNORMAL LOW
Sodium: 133 — ABNORMAL LOW
Sodium: 133 — ABNORMAL LOW
Sodium: 133 — ABNORMAL LOW
Sodium: 133 — ABNORMAL LOW
Sodium: 134 — ABNORMAL LOW
Sodium: 134 — ABNORMAL LOW
Sodium: 134 — ABNORMAL LOW
Sodium: 134 — ABNORMAL LOW
Sodium: 136
Sodium: 136
Sodium: 137
Sodium: 137
Sodium: 137
Sodium: 140

## 2011-08-10 LAB — CBC
HCT: 25.7 — ABNORMAL LOW
HCT: 26.3 — ABNORMAL LOW
HCT: 26.6 — ABNORMAL LOW
HCT: 27.2 — ABNORMAL LOW
HCT: 27.2 — ABNORMAL LOW
HCT: 27.6 — ABNORMAL LOW
HCT: 27.8 — ABNORMAL LOW
Hemoglobin: 8.4 — ABNORMAL LOW
Hemoglobin: 8.7 — ABNORMAL LOW
Hemoglobin: 8.8 — ABNORMAL LOW
Hemoglobin: 9.1 — ABNORMAL LOW
Hemoglobin: 9.1 — ABNORMAL LOW
Hemoglobin: 9.2 — ABNORMAL LOW
Hemoglobin: 9.2 — ABNORMAL LOW
MCHC: 32.8
MCHC: 32.9
MCHC: 33.1
MCHC: 33.1
MCHC: 33.2
MCHC: 33.5
MCHC: 33.9
MCV: 86.2
MCV: 86.5
MCV: 86.6
MCV: 86.8
MCV: 87
MCV: 87.2
MCV: 87.4
Platelets: 242
Platelets: 271
Platelets: 298
Platelets: 315
Platelets: 321
Platelets: 322
Platelets: 335
RBC: 2.94 — ABNORMAL LOW
RBC: 3.04 — ABNORMAL LOW
RBC: 3.06 — ABNORMAL LOW
RBC: 3.12 — ABNORMAL LOW
RBC: 3.15 — ABNORMAL LOW
RBC: 3.21 — ABNORMAL LOW
RBC: 3.21 — ABNORMAL LOW
RDW: 15.5
RDW: 15.6 — ABNORMAL HIGH
RDW: 15.7 — ABNORMAL HIGH
RDW: 15.7 — ABNORMAL HIGH
RDW: 15.9 — ABNORMAL HIGH
RDW: 16 — ABNORMAL HIGH
RDW: 16 — ABNORMAL HIGH
WBC: 11.3 — ABNORMAL HIGH
WBC: 11.7 — ABNORMAL HIGH
WBC: 12.1 — ABNORMAL HIGH
WBC: 12.2 — ABNORMAL HIGH
WBC: 4.4
WBC: 7.6
WBC: 9.4

## 2011-08-10 LAB — PROTIME-INR
INR: 1.2
INR: 1.3
INR: 1.4
INR: 1.5
INR: 1.8 — ABNORMAL HIGH
INR: 2 — ABNORMAL HIGH
INR: 2 — ABNORMAL HIGH
INR: 2.1 — ABNORMAL HIGH
INR: 2.1 — ABNORMAL HIGH
INR: 2.2 — ABNORMAL HIGH
INR: 2.2 — ABNORMAL HIGH
INR: 2.3 — ABNORMAL HIGH
INR: 2.4 — ABNORMAL HIGH
INR: 2.4 — ABNORMAL HIGH
INR: 2.5 — ABNORMAL HIGH
INR: 2.6 — ABNORMAL HIGH
Prothrombin Time: 15.8 — ABNORMAL HIGH
Prothrombin Time: 16.7 — ABNORMAL HIGH
Prothrombin Time: 17.6 — ABNORMAL HIGH
Prothrombin Time: 19.1 — ABNORMAL HIGH
Prothrombin Time: 21.4 — ABNORMAL HIGH
Prothrombin Time: 24.3 — ABNORMAL HIGH
Prothrombin Time: 24.3 — ABNORMAL HIGH
Prothrombin Time: 24.6 — ABNORMAL HIGH
Prothrombin Time: 24.7 — ABNORMAL HIGH
Prothrombin Time: 25.9 — ABNORMAL HIGH
Prothrombin Time: 26.2 — ABNORMAL HIGH
Prothrombin Time: 27.5 — ABNORMAL HIGH
Prothrombin Time: 27.7 — ABNORMAL HIGH
Prothrombin Time: 28.9 — ABNORMAL HIGH
Prothrombin Time: 29.7 — ABNORMAL HIGH

## 2011-08-10 LAB — URINE CULTURE: Colony Count: 50000

## 2011-08-10 LAB — URINALYSIS, ROUTINE W REFLEX MICROSCOPIC
Glucose, UA: NEGATIVE
pH: 5

## 2011-08-10 LAB — URINE MICROSCOPIC-ADD ON

## 2011-08-10 LAB — B-NATRIURETIC PEPTIDE (CONVERTED LAB): Pro B Natriuretic peptide (BNP): 392 — ABNORMAL HIGH

## 2011-08-10 LAB — DIGOXIN LEVEL: Digoxin Level: 0.4 — ABNORMAL LOW

## 2011-08-13 LAB — GLUCOSE, CAPILLARY
Glucose-Capillary: 112 — ABNORMAL HIGH
Glucose-Capillary: 163 — ABNORMAL HIGH
Glucose-Capillary: 212 — ABNORMAL HIGH
Glucose-Capillary: 113 — ABNORMAL HIGH
Glucose-Capillary: 144 — ABNORMAL HIGH
Glucose-Capillary: 91

## 2011-08-14 LAB — GLUCOSE, CAPILLARY: Glucose-Capillary: 151 — ABNORMAL HIGH

## 2012-02-11 ENCOUNTER — Ambulatory Visit: Payer: Medicare Other | Admitting: Cardiovascular Disease

## 2012-02-25 ENCOUNTER — Encounter: Payer: Self-pay | Admitting: Cardiovascular Disease

## 2012-02-25 ENCOUNTER — Ambulatory Visit (INDEPENDENT_AMBULATORY_CARE_PROVIDER_SITE_OTHER): Payer: Medicare Other | Admitting: Cardiovascular Disease

## 2012-02-25 VITALS — BP 130/75 | HR 70 | Ht 69.0 in | Wt 297.0 lb

## 2012-02-25 DIAGNOSIS — I4891 Unspecified atrial fibrillation: Secondary | ICD-10-CM

## 2012-02-25 DIAGNOSIS — I5022 Chronic systolic (congestive) heart failure: Secondary | ICD-10-CM

## 2012-02-25 DIAGNOSIS — I2589 Other forms of chronic ischemic heart disease: Secondary | ICD-10-CM

## 2012-02-25 DIAGNOSIS — I2510 Atherosclerotic heart disease of native coronary artery without angina pectoris: Secondary | ICD-10-CM

## 2012-02-25 MED ORDER — CARVEDILOL 12.5 MG PO TABS: 12.5000 mg | ORAL_TABLET | Freq: Two times a day (BID) | ORAL | Status: DC

## 2012-02-25 MED ORDER — ASPIRIN 325 MG PO TBEC: 325.0000 mg | DELAYED_RELEASE_TABLET | Freq: Every day | ORAL | Status: DC

## 2012-02-25 NOTE — Assessment & Plan Note (Signed)
She is back in atrial fibrillation today. She does not wish to take Coumadin or any blood thinners. I will increase her ASA to 325 mg po Qdaily and increase her Coreg to 12.5 mg po BID.

## 2012-02-25 NOTE — Assessment & Plan Note (Signed)
Volume status is ok. Continue diuretics.

## 2012-02-25 NOTE — Progress Notes (Addendum)
History of Present Illness: 71 yo WF with h/o CAD s/p CABG [id number], systolic CHF, ischemic CM, HL, DM here today for cardiac follow up. She has been followed in the past by Dr. Olevia Perches. I saw her in December 2011 as an add on for evaluation of swelling in legs.  She had bypass surgery in 2009. Her ejection fraction improved from 30% to 45-50%. She has a history of systolic CHF. She had atrial fibrillation at the time of her surgery and was treated with amiodarone and Coumadin and is now off both drugs. She had an echocardiogram done in February 2011 which showed an ejection fraction of 45%. ABI normal in primary care office 2012.    She tells me today that she has been doing well from a cardiac standpoint. No chest pain, SOB, palpitations near syncope or syncope. . She has had problems with her eyes. She had cataract surgery in November and had laser surgery on the right eye today. She has plans for laser surgery on her left eye in several weeks. Her weight is down 28 pounds over last two months.    Primary Care Physician: Lovette Cliche  Last Lipid Profile:  Past Medical History  Diagnosis Date  . CAD (coronary artery disease) of artery bypass graft 05/2008    S/P CABG   . Ischemic cardiomyopathy     EF 30%, improved to 45-50% by a recent echo  . Systolic heart failure     Now appears euvolemic  . Atrial fibrillation     Postoperative, S/P transesophageal echocardiography-guided cardioversion, now holding sinus rhythm on Amiodarone and Coumadin  . Diabetes mellitus   . Hyperlipidemia     Past Surgical History  Procedure Date  . Ventral hernia repair 10/1999    With mesh  . Total knee arthroplasty 1994    Right  . Appendectomy     At age 71    Current Outpatient Prescriptions  Medication Sig Dispense Refill  . aspirin 81 MG EC tablet Take 81 mg by mouth daily.        Marland Kitchen BAYER CONTOUR NEXT TEST test strip As directed      . carvedilol (COREG) 6.25 MG tablet Take 6.25 mg by mouth 2  (two) times daily with a meal.        . cyanocobalamin 2000 MCG tablet Take 2,000 mcg by mouth daily.        Marland Kitchen docusate sodium (COLACE) 100 MG capsule Take 100 mg by mouth daily as needed.        . Glucose Blood (BL TEST STRIP PACK VI) As directed      . insulin glargine (LANTUS) 100 UNIT/ML injection Inject 21 units into area at bedtime      . losartan (COZAAR) 25 MG tablet Take 25 mg by mouth daily.        . nitroGLYCERIN (NITROSTAT) 0.4 MG SL tablet Place 0.4 mg under the tongue every 5 (five) minutes as needed. May repeat for up to 3 doses.       . potassium chloride SA (K-DUR,KLOR-CON) 20 MEQ tablet Take 20 mEq by mouth daily.        . simvastatin (ZOCOR) 40 MG tablet Take 40 mg by mouth at bedtime.        . torsemide (DEMADEX) 20 MG tablet Take 40 mg by mouth daily.          Allergies  Allergen Reactions  . Multigen   . Spironolactone   . Sulfamethoxazole W/Trimethoprim  History   Social History  . Marital Status: Married    Spouse Name: N/A    Number of Children: N/A  . Years of Education: N/A   Occupational History  . Not on file.   Social History Main Topics  . Smoking status: Never Smoker   . Smokeless tobacco: Not on file  . Alcohol Use: No  . Drug Use: No  . Sexually Active: Not on file   Other Topics Concern  . Not on file   Social History Narrative   WidowedLives alone in [place] routinely exercising    Family History  Problem Relation Age of Onset  . Clotting disorder Mother     Cerebral hemorrhage  . Emphysema Father     COD  . Lung cancer Brother   . Heart disease Neg Hx     Review of Systems:  As stated in the HPI and otherwise negative.   BP 130/75  Pulse 70  Ht 5\' 9"  (1.753 m)  Wt 297 lb (134.718 kg)  BMI 43.86 kg/m2  Physical Examination: General: Well developed, well nourished, NAD HEENT: OP clear, mucus membranes moist SKIN: warm, dry. No rashes. Neuro: No focal deficits Musculoskeletal: Muscle strength 5/5  all ext Psychiatric: Mood and affect normal Neck: No JVD, no carotid bruits, no thyromegaly, no lymphadenopathy. Lungs:Clear bilaterally, no wheezes, rhonci, crackles Cardiovascular: Regular rate and rhythm. No murmurs, gallops or rubs. Abdomen:Soft. Bowel sounds present. Non-tender.  Extremities: No lower extremity edema. Pulses are 2 + in the bilateral DP/PT.  EKG: Atrial fibrillation, rate 87 bpm. Non-specific ST and T wave abnormalities.

## 2012-02-25 NOTE — Assessment & Plan Note (Addendum)
Stable. She is on good therapy. Will increase Coreg to 12.5 mg po BID. Continue statin, ARB, ASA.

## 2012-02-25 NOTE — Patient Instructions (Addendum)
Your physician has recommended you make the following change in your medication: Increase Coreg to 12. 5 mg by mouth twice daily. Increase aspirin to 325 mg by mouth daily   Your physician wants you to follow-up in: 6 months. You will receive a reminder letter in the mail two months in advance. If you don't receive a letter, please call our office to schedule the follow-up appointment.      1500 Calorie Diabetic Diet The 1500 calorie diabetic diet limits calories to 1500 each day. Following this diet and making healthy meal choices can help improve overall health. It controls blood glucose (sugar) levels and can also help lower blood pressure and cholesterol.  SERVING SIZES Measuring foods and serving sizes helps to make sure you are getting the right amount of food. The list below tells how big or small some common serving sizes are.  1 oz.........4 stacked dice.   3 oz........Marland KitchenDeck of cards.   1 tsp.......Marland KitchenTip of little finger.   1 tbs......Marland KitchenMarland KitchenThumb.   2 tbs.......Marland KitchenGolf ball.    cup......Marland KitchenHalf of a fist.   1 cup.......Marland KitchenA fist.  GUIDELINES FOR CHOOSING FOODS The goal of this diet is to eat a variety of foods and limit calories to 1500 each day. This can be done by choosing foods that are low in calories and fat. The diet also suggests eating small amounts of food frequently. Doing this helps control your blood glucose levels, so they do not get too high or too low. Each meal or snack may include a protein food source to help you feel more satisfied. Try to eat about the same amount of food around the same time each day. This includes weekend days, travel days, and days off work. Space your meals about 4 to 5 hours apart, and add a snack between them, if you wish.  For example, a daily food plan could include breakfast, a morning snack, lunch, dinner, and an evening snack. Healthy meals and snacks have different types of foods, including whole grains, vegetables, fruits, lean meats,  poultry, fish, and dairy products. As you plan your meals, select a variety of foods. Choose from the bread and starch, vegetable, fruit, dairy, and meat/protein groups. Examples of foods from each group are listed below, with their suggested serving sizes. Use measuring cups and spoons to become familiar with what a healthy portion looks like. Bread and Starch Each serving equals 15 grams of carbohydrate.  1 slice bread.    bagel.    cup cold cereal (unsweetened).    cup hot cereal or mashed potatoes.   1 small potato (size of a computer mouse).   ? cup cooked pasta or rice.    English muffin.   1 cup broth-based soup.   3 cups of popcorn.   4 to 6 whole-wheat crackers.    cup cooked beans, peas, or corn.  Vegetables Each serving equals 5 grams of carbohydrate.   cup cooked vegetables.   1 cup raw vegetables.    cup tomato or vegetable juice.  Fruit Each serving equals 15 grams of carbohydrate.  1 small apple or orange.   1  cup watermelon or strawberries.    cup applesauce (no sugar added).   2 tbs raisins.    banana.    cup canned fruit, packed in water or in its own juice.    cup unsweetened fruit juice.  Dairy Each serving equals 12 to 15 grams of carbohydrate.  1 cup fat-free milk.   6 oz  artificially sweetened yogurt or plain yogurt.   1 cup low-fat buttermilk.   1 cup soy milk.   1 cup almond milk.  Meat/Protein  1 large egg.   2 to 3 oz meat, poultry, or fish.    cup low-fat cottage cheese.   1 tbs peanut butter.   1 oz low-fat cheese.    cup tuna, packed in water.    cup tofu.  Fat  1 tsp oil.   1 tsp trans-fat-free margarine.   1 tsp butter.   1 tsp mayonnaise.   2 tbs avocado.   1 tbs salad dressing.   1 tbs cream cheese.   2 tbs sour cream.  SAMPLE 1500 CALORIE DIET PLAN Breakfast   whole-wheat English muffin (1 carb serving).   1 tsp trans-fat-free margarine.   1 scrambled egg.   1 cup  fat-free milk (1 carb serving).   1 small orange (1 carb serving).  Lunch  Chicken wrap.   1 whole-wheat tortilla, 8-inch (1 carb servings).   2 oz chicken breast, sliced.   2 tbs low-fat salad dressing, such as [place].    cup shredded lettuce.   2 slices tomato.    cup carrot sticks.   1 small apple (1 carb serving).  Afternoon Snack  3 graham cracker squares (1 carb serving).   1 tbs peanut butter.  Dinner  2 oz lean pork chop, broiled.   1 cup brown rice (3 carb servings).    cup steamed carrots.    cup green beans.   1 cup fat-free milk (1 carb serving).   1 tsp trans-fat-free margarine.  Evening Snack   cup low-fat cottage cheese.   1 small peach or pear, sliced (or  cup canned in water) (1 carb serving).  MEAL PLAN You can use this worksheet to help you make a daily meal plan based on the 1500 calorie diabetic diet suggestions. If you are using this plan to help you control your blood glucose, you may interchange carbohydrate containing foods (dairy, starches, and fruits). Select a variety of fresh foods of varying colors and flavors. The total amount of carbohydrate in your meals or snacks is more important than making sure you include all of the food groups every time you eat. You can choose from approximately this many of the following foods to build your day's meals:  6 Starches.   3 Vegetables.   2 Fruits.   2 Dairy.   4 to 6 oz Meat/Protein.   Up to 3 Fats.  Your dietician can use this worksheet to help you decide how many servings and which types of foods are right for you. BREAKFAST Food Group and Servings / Food Choice Starch _________________________________________________________ Dairy __________________________________________________________ Fruit ___________________________________________________________ Meat/Protein____________________________________________________ Fat  ____________________________________________________________ LUNCH Food Group and Servings / Food Choice  Starch _________________________________________________________ Meat/Protein ___________________________________________________ Vegetables _____________________________________________________ Fruit __________________________________________________________ Dairy __________________________________________________________ Fat ____________________________________________________________ [name] Food Group and Servings / Food Choice Dairy __________________________________________________________ Starch _________________________________________________________ Meat/Protein____________________________________________________ [name] ___________________________________________________________ [name] Food Group and Servings / Food Choice Starch _________________________________________________________ Meat/Protein ___________________________________________________ Dairy __________________________________________________________ Vegetable ______________________________________________________ Fruit ___________________________________________________________ Fat ____________________________________________________________ [name] Food Group and Servings / Food Choice Fruit ___________________________________________________________ Meat/Protein ____________________________________________________ Dairy __________________________________________________________ Starch __________________________________________________________ DAILY TOTALS Starches _________________________ Vegetables _______________________ Fruits ____________________________ Dairy ____________________________ Meat/Protein_____________________ Fats _____________________________ Document Released: 05/21/2005 Document Revised: 10/18/2011 Document Reviewed: 09/15/2009 Exit[employer] Patient Information 2012  [place], [place].

## 2012-02-25 NOTE — Assessment & Plan Note (Signed)
Continue medical therapy 

## 2012-02-25 NOTE — Assessment & Plan Note (Signed)
>>  ASSESSMENT AND PLAN FOR ACC/AHA STAGE C CONGESTIVE HEART FAILURE DUE TO ISCHEMIC CARDIOMYOPATHY (HCC) WRITTEN ON 02/25/2012  2:17 PM BY Kathleene Hazel, MD  Continue medical therapy.

## 2012-02-26 ENCOUNTER — Telehealth: Payer: Self-pay | Admitting: Cardiovascular Disease

## 2012-02-26 NOTE — Telephone Encounter (Signed)
Fu call °Patient returning your call °

## 2012-02-26 NOTE — Telephone Encounter (Signed)
PT CALLED TO LET ME KNOW WHAT TYPE OF SHAKES SHE WAS USING FOR WAIT LOST

## 2012-02-26 NOTE — Telephone Encounter (Signed)
Pt returning call to Lubertha Basque.  I do not see why she was called. I told pt I would check with Neoma Laming and we would call her back

## 2012-02-27 ENCOUNTER — Telehealth: Payer: Self-pay | Admitting: Cardiovascular Disease

## 2012-02-27 NOTE — Telephone Encounter (Signed)
Spoke with pt and she wanted to speak with [name] regarding weight loss shakes. Will forward

## 2012-02-27 NOTE — Telephone Encounter (Signed)
New msg Pt wants to talk to you abour an order

## 2012-03-03 ENCOUNTER — Telehealth: Payer: Self-pay | Admitting: Cardiovascular Disease

## 2012-03-03 DIAGNOSIS — I2510 Atherosclerotic heart disease of native coronary artery without angina pectoris: Secondary | ICD-10-CM

## 2012-03-03 DIAGNOSIS — I2589 Other forms of chronic ischemic heart disease: Secondary | ICD-10-CM

## 2012-03-03 DIAGNOSIS — I5022 Chronic systolic (congestive) heart failure: Secondary | ICD-10-CM

## 2012-03-03 DIAGNOSIS — I4891 Unspecified atrial fibrillation: Secondary | ICD-10-CM

## 2012-03-03 NOTE — Telephone Encounter (Signed)
States she got up to go to the bathroom and felt dizzy again, took bp 111/68 p 79, few minutes later was 108/67 p 68, informed her these are good readings and nothing for concern, informed her she may have gotten up to quickly, told her to get up slowly and wait a minute to walk, pt agreed, Pt spoke with pcp // Dr Lin Landsman will do coumadin dosing/ INRs, he just needs range you want her INR to be in, told pt I will forward to Dr McAlhany/ nurse and will hear from office tomorrow, pt agreed to plan.

## 2012-03-03 NOTE — Telephone Encounter (Signed)
Fu call Pt wants to talk to you again

## 2012-03-03 NOTE — Telephone Encounter (Signed)
Please return call to patient   Patient has medication concerns, please return call to [phone].

## 2012-03-03 NOTE — Telephone Encounter (Signed)
Pt felt bad (lathargic, extreme weakness, weak legs)  over weekend since increase of coreg and asa from last OV. Pt did not get BP/ P during that time. Today she feels better bp 134/81 p 62-70. Discussed that her BP may have been low and caused those symptoms and now has adjusted to new strength of coreg and she agreed. She had questions about use of coumadin and wondered if PCP could regulate so she didn't have a 40 dollar co pay? I advised her to call PCP to see if he was willing to manage, she agreed to make call. Pt wants to first see if she will continue to feel ok on higher dose of coreg and will call to inform of her discission on switching to coumadin at later date, informed her I will update Dr Julianne Handler and RN of discussion.

## 2012-03-03 NOTE — Telephone Encounter (Signed)
Thanks. I agree.  [name]

## 2012-03-04 MED ORDER — ASPIRIN EC 325 MG PO TBEC: 325.0000 mg | DELAYED_RELEASE_TABLET | Freq: Every day | ORAL | Status: DC

## 2012-03-04 MED ORDER — ASPIRIN EC 81 MG PO TBEC: 81.0000 mg | DELAYED_RELEASE_TABLET | Freq: Every day | ORAL | Status: DC

## 2012-03-04 MED ORDER — CARVEDILOL 6.25 MG PO TABS: 6.2500 mg | ORAL_TABLET | Freq: Two times a day (BID) | ORAL | Status: DC

## 2012-03-04 NOTE — Telephone Encounter (Signed)
Pat, Can we follow up with her and let her know INR range should be 2.0-3.0 for atrial fib. If she is still feeling dizzy, we can go back to previous dose of coreg. Thanks, chris

## 2012-03-04 NOTE — Telephone Encounter (Signed)
Spoke with pt and gave her instructions from Dr. Angelena Form. She will decrease Coreg to 6.25 mg by mouth twice daily. She will decrease aspirin to 81 mg by mouth daily after starting Coumadin. I told pt we would fax information regarding Coumadin and INR range to Dr. Lin Landsman at [facility] in [place].  She has been using shakes for weight loss for several weeks and will make primary MD aware of this prior to starting Coumadin.

## 2012-03-04 NOTE — Telephone Encounter (Signed)
I called Dr. Janace Aris office to discuss. Left message to call back

## 2012-03-05 NOTE — Telephone Encounter (Signed)
Will fax this note and last office visit note to Dr. Lin Landsman at [phone]. Pt also will call Dr.Redding's office to make appt to monitor Coumadin.

## 2012-08-25 ENCOUNTER — Ambulatory Visit (INDEPENDENT_AMBULATORY_CARE_PROVIDER_SITE_OTHER): Payer: Medicare Other | Admitting: Cardiovascular Disease

## 2012-08-25 ENCOUNTER — Encounter: Payer: Self-pay | Admitting: Cardiovascular Disease

## 2012-08-25 VITALS — BP 130/60 | HR 80 | Ht 69.0 in | Wt 306.1 lb

## 2012-08-25 DIAGNOSIS — I25810 Atherosclerosis of coronary artery bypass graft(s) without angina pectoris: Secondary | ICD-10-CM

## 2012-08-25 DIAGNOSIS — I2510 Atherosclerotic heart disease of native coronary artery without angina pectoris: Secondary | ICD-10-CM

## 2012-08-25 DIAGNOSIS — I4891 Unspecified atrial fibrillation: Secondary | ICD-10-CM

## 2012-08-25 DIAGNOSIS — I255 Ischemic cardiomyopathy: Secondary | ICD-10-CM

## 2012-08-25 DIAGNOSIS — I2589 Other forms of chronic ischemic heart disease: Secondary | ICD-10-CM

## 2012-08-25 MED ORDER — ASPIRIN EC 81 MG PO TBEC: 81.0000 mg | DELAYED_RELEASE_TABLET | Freq: Every day | ORAL | Status: AC

## 2012-08-25 NOTE — Progress Notes (Addendum)
History of Present Illness: 71 yo WF with h/o CAD s/p CABG 123XX123, systolic CHF, ischemic CM, HL, DM here today for cardiac follow up. She has been followed in the past by Dr. Olevia Perches. She had bypass surgery in 2009. Her ejection fraction improved from 30% to 45-50%. She has a history of systolic CHF. She had atrial fibrillation at the time of her surgery and was treated with amiodarone and Coumadin but these were stopped after a short period. She had an echocardiogram done in February 2011 which showed an ejection fraction of 45%. ABI normal in primary care office 2012. At her last visit in our office in April 2013, she was back in atrial fibrillation. She did not wish to take Coumadin or any blood thinners so I increased her ASA to 325 mg po Qdaily and increased her Coreg to 25 mg po BID. She then decided later to start the coumadin. Her INR is followed in primary care.    She tells me today that she has been doing well from a cardiac standpoint. No chest pain, SOB, palpitations, near syncope or syncope. Occasional dizziness in am. BP has been well controlled   Primary Care Physician: Lovette Cliche   Last Lipid Profile:Lipid Panel     Component Value Date/Time   CHOL 148 10/12/2010 1006   TRIG 110.0 10/12/2010 1006   HDL 40.30 10/12/2010 1006   CHOLHDL 4 10/12/2010 1006   VLDL 22.0 10/12/2010 1006   Peterson 86 10/12/2010 1006     Past Medical History  Diagnosis Date  . CAD (coronary artery disease) of artery bypass graft 05/2008    S/P CABG   . Ischemic cardiomyopathy     EF 30%, improved to 45-50% by a recent echo  . Systolic heart failure     Now appears euvolemic  . Campath-induced atrial fibrillation     Postoperative, S/P transesophageal echocardiography-guided cardioversion, now holding sinus rhythm on Amiodarone and Coumadin  . Diabetes mellitus   . Hyperlipidemia     Past Surgical History  Procedure Date  . Ventral hernia repair 10/1999    With mesh  . Total knee arthroplasty  1994    Right  . Appendectomy     At age 34    Current Outpatient Prescriptions  Medication Sig Dispense Refill  . aspirin EC 81 MG tablet Take 1 tablet (81 mg total) by mouth daily.  30 tablet  0  . BAYER CONTOUR NEXT TEST test strip As directed      . carvedilol (COREG) 6.25 MG tablet Take 1 tablet (6.25 mg total) by mouth 2 (two) times daily with a meal.  60 tablet  6  . cyanocobalamin 2000 MCG tablet Take 2,000 mcg by mouth daily.        Marland Kitchen docusate sodium (COLACE) 100 MG capsule Take 100 mg by mouth daily as needed.        . Glucose Blood (BL TEST STRIP PACK VI) As directed      . insulin glargine (LANTUS) 100 UNIT/ML injection Inject 25 units into area at bedtime      . losartan (COZAAR) 25 MG tablet Take 25 mg by mouth daily.        . nitroGLYCERIN (NITROSTAT) 0.4 MG SL tablet Place 0.4 mg under the tongue every 5 (five) minutes as needed. May repeat for up to 3 doses.       . potassium chloride SA (K-DUR,KLOR-CON) 20 MEQ tablet Take 20 mEq by mouth daily.        Marland Kitchen  simvastatin (ZOCOR) 40 MG tablet Take 40 mg by mouth at bedtime.        . torsemide (DEMADEX) 20 MG tablet Take 40 mg by mouth daily.        Marland Kitchen warfarin (COUMADIN) 7.5 MG tablet Take 7.5 mg by mouth daily.        Allergies  Allergen Reactions  . Fe-Succ-C-Thre-B12-Des Stomach   . Spironolactone   . Sulfamethoxazole W-Trimethoprim     History   Social History  . Marital Status: Married    Spouse Name: N/A    Number of Children: N/A  . Years of Education: N/A   Occupational History  . Not on file.   Social History Main Topics  . Smoking status: Never Smoker   . Smokeless tobacco: Not on file  . Alcohol Use: No  . Drug Use: No  . Sexually Active: Not on file   Other Topics Concern  . Not on file   Social History Narrative   WidowedLives alone in [place] routinely exercising    Family History  Problem Relation Age of Onset  . Clotting disorder Mother     Cerebral hemorrhage  .  Emphysema Father     COD  . Lung cancer Brother   . Heart disease Neg Hx     Review of Systems:  As stated in the HPI and otherwise negative.   BP 130/60  Pulse 80  Ht 5\' 9"  (1.753 m)  Wt 306 lb 1.9 oz (138.855 kg)  BMI 45.21 kg/m2  Physical Examination: General: Well developed, well nourished, NAD HEENT: OP clear, mucus membranes moist SKIN: warm, dry. No rashes. Neuro: No focal deficits Musculoskeletal: Muscle strength 5/5 all ext Psychiatric: Mood and affect normal Neck: No JVD, no carotid bruits, no thyromegaly, no lymphadenopathy. Lungs:Clear bilaterally, no wheezes, rhonci, crackles Cardiovascular: Irregular rate and rhythm. No murmurs, gallops or rubs. Abdomen:Soft. Bowel sounds present. Non-tender.  Extremities: No lower extremity edema. Pulses are 2 + in the bilateral DP/PT.  EKG: Atrial fibrillation, rate 75 bpm.   Assessment and Plan:   1. CAD: Stable. She is on good therapy. Continue Coreg, statin, ARB, ASA.   2. ATRIAL FIBRILLATION:   Rate controlled. She is on coumadin. No awareness of palpitations. She does have some dizziness which may or may not be related to her atrial fibrillation.  It would be worthwhile to consider DCCV. She will think about this and call us.   3. CARDIOMYOPATHY, ISCHEMIC: Continue medical therapy.   4. SYSTOLIC HEART FAILURE, CHRONIC: Volume status is ok. Continue diuretics.

## 2012-08-25 NOTE — Patient Instructions (Signed)
Your physician wants you to follow-up in:  12 months.  You will receive a reminder letter in the mail two months in advance. If you don't receive a letter, please call our office to schedule the follow-up appointment.   

## 2012-08-26 ENCOUNTER — Telehealth: Payer: Self-pay | Admitting: Cardiovascular Disease

## 2012-08-26 DIAGNOSIS — Z01812 Encounter for preprocedural laboratory examination: Secondary | ICD-10-CM

## 2012-08-26 NOTE — Telephone Encounter (Signed)
New problem:  Seen Dr. Angelena Form  on yesterday , patient decide to have her heart shock.

## 2012-08-26 NOTE — Telephone Encounter (Signed)
Requesting cardioversion early am.  Scheduled for 09/11/12 with Dr Aundra Dubin.  Labs scheduled and ordered for 09/08/12.

## 2012-08-26 NOTE — Telephone Encounter (Signed)
Pt will have Dr Lin Landsman fax a copy of her INRs.  I advised her that her inrs need to be therapeutic for 4 weeks prior to the cardioversion in order to do it and that if they are not therapeutic, her procedure may need to be canceled.  Dr Camillia Herter nurse, Fraser Din, to let pt know and send instructions when INRs have been received.  Pt diabetic and on insulin in the pm only.

## 2012-08-29 ENCOUNTER — Telehealth: Payer: Self-pay | Admitting: Cardiovascular Disease

## 2012-08-29 NOTE — Telephone Encounter (Signed)
Agree. cdm 

## 2012-08-29 NOTE — Telephone Encounter (Signed)
Spoke with pt who states she was talking with her neighbor and neighbor was questioning her dose of coumadin based on INR today.  She states neighbor did not think she needed extra Coumadin based on INR done today.  Pt's coumadin management is managed by Dr Janace Aris office and I told her she would need to contact his office with this question.  She states she has done so and is OK with information she received from his office but just wanted to check with [place] as well.  I again offered pt the option of having coumadin managed by our office but she does not want to do this due to her copay.

## 2012-08-29 NOTE — Telephone Encounter (Signed)
Spoke with pt. She has not been having INR checked weekly at Dr. Janace Aris office.  She had it checked today and it was 1.7. Coumadin was adjusted and she will see them again next week.  She is aware today's INR was not therapeutic and we would need 4 weeks of therapeutic readings prior to cardioversion.  She will have Dr. Janace Aris office send all readings to [place].  Will cancel cardioversion for September 11, 2012 and reschedule  once therapeutic for 4 consecutive weeks.

## 2012-08-29 NOTE — Telephone Encounter (Signed)
Pt calling re problem with her heart

## 2012-09-04 ENCOUNTER — Telehealth: Payer: Self-pay | Admitting: Cardiovascular Disease

## 2012-09-04 NOTE — Telephone Encounter (Signed)
Spoke with pt and told her she did not need to have lab work done in our office at this time.  Labs were pre cardioversion labs but cardioversion has been postponed until INR therapeutic.

## 2012-09-04 NOTE — Telephone Encounter (Signed)
plz return call to patient [phone], who wants to talk with nurse about having labs done at another Dr. Gabriel Carina

## 2012-09-08 ENCOUNTER — Other Ambulatory Visit: Payer: Medicare Other

## 2012-09-11 ENCOUNTER — Encounter (HOSPITAL_COMMUNITY): Payer: Self-pay

## 2012-09-11 ENCOUNTER — Ambulatory Visit (HOSPITAL_COMMUNITY): Admit: 2012-09-11 | Payer: Medicare Other | Admitting: Cardiology

## 2012-09-11 SURGERY — CARDIOVERSION
Anesthesia: Monitor Anesthesia Care

## 2012-10-15 NOTE — Telephone Encounter (Signed)
No additional INR's have been received in office. I called pt to see if she was still having this checked at Dr.Redding's office. Pt states she has been going on Fridays and office was to send readings to [place].  I told her we have not received any reports since October 18. She will contact Dr. Janace Aris office and have them send reports to [place].

## 2012-10-21 NOTE — Telephone Encounter (Signed)
Fax received in office with pt's name and handwritten recent INR's.  No other information on sheet--no indication of office it was sent from.  INR's as follows :   08/29/12--1.8    09/05/12- 2.2   09/19/12--1.9   09/25/12-- 2.9 10/03/12 --2.4

## 2012-10-23 NOTE — Telephone Encounter (Signed)
Spoke with pt and told her we would need more information than INR's handwritten on paper. She is going to Dr. Janace Aris office tomorrow and will ask them to send office note or lab results to office.

## 2012-10-24 ENCOUNTER — Telehealth: Payer: Self-pay | Admitting: Cardiovascular Disease

## 2012-10-24 NOTE — Telephone Encounter (Signed)
**Note De-Identified  Obfuscation** Pt states that she missed her INR check with her PCP in [place]  this morning and that appt. has been rescheduled for next Friday 10/31/12. She states she wants Fraser Din, Dr. Camillia Herter nurse, to be aware.

## 2012-10-24 NOTE — Telephone Encounter (Signed)
Plz return call to pt [phone] regarding PTINR appnt in [place].

## 2012-11-07 ENCOUNTER — Encounter: Payer: Self-pay | Admitting: Cardiovascular Disease

## 2012-11-11 NOTE — Telephone Encounter (Signed)
Received fax from [facility] of INR of 3.8 on 11/07/12.  I spoke with pt and she reports she had INR checked on 10/31/12 and it was 3.0.  Coumadin was changed to 7.5 mg daily on 12/27 based on INR done that day.  Pt is having INR checked again on 11/14/12. She is aware she will need 4 weeks of therapeutic INR's before Cardioversion can be scheduled.  She will have INR that was done on 12/20 sent to our office   She is planning on having INR checked again on 1/10 also.

## 2012-11-14 NOTE — Telephone Encounter (Signed)
Spoke with pt. She reports INR was 1.8 today. Coumadin adjusted. Pt aware 4 week period of therapeutic INR's will need to be restarted. She will have results sent to our office.

## 2012-11-21 ENCOUNTER — Encounter: Payer: Self-pay | Admitting: Cardiovascular Disease

## 2012-11-21 LAB — PROTIME-INR

## 2012-11-28 ENCOUNTER — Encounter: Payer: Self-pay | Admitting: Pharmacist

## 2012-11-28 LAB — PROTIME-INR: INR: 2.1 — AB (ref 0.9–1.1)

## 2012-12-05 ENCOUNTER — Telehealth: Payer: Self-pay | Admitting: Cardiovascular Disease

## 2012-12-05 ENCOUNTER — Encounter: Payer: Self-pay | Admitting: Pharmacist

## 2012-12-05 NOTE — Telephone Encounter (Signed)
New problem:   Was told to call back speak with Fraser Din regarding protime.

## 2012-12-05 NOTE — Telephone Encounter (Signed)
Pt. states that she had INR check at her PCP's office on 1/17 (INR was 2.1) and on today (12/05/12 INR is 2.5).  Pt. Is advised that we do not have these results in her chart.  Pt given our fax number as she wants to call that office to ask them to fax [place] those results.

## 2012-12-08 ENCOUNTER — Telehealth: Payer: Self-pay | Admitting: Cardiovascular Disease

## 2012-12-08 ENCOUNTER — Telehealth: Payer: Self-pay | Admitting: *Deleted

## 2012-12-08 NOTE — Telephone Encounter (Signed)
New Problem:    Patient called in wanting you to give her a call.  Please call back.  Patient is aware that you were out of the office when this message was taken.

## 2012-12-08 NOTE — Telephone Encounter (Signed)
  Received fax from [place], [place] Coumadin managed by this practice  1/17---INR 2.1  1/24---INR 2.5  Instructed to alternate 7.5 mg with 8 mg po  Will route to Dr Angelena Form

## 2012-12-10 NOTE — Telephone Encounter (Signed)
We have received fax in office of INR of 2.2 on January 10. Per phone note from December 08, 2012 fax has been received with INR's from January 17 and 24.

## 2012-12-10 NOTE — Telephone Encounter (Signed)
Spoke with pt and told her fax had been received in office of INR's done January 17 and 24.(see phone note dated January 27,2014). She is having it checked again on January 31 and will have results faxed to our office.

## 2012-12-11 NOTE — Telephone Encounter (Signed)
Reviewed with Dr. Angelena Form and pt will need office visit prior to cardioversion. This is pending therapeutic INR tomorrow.  I spoke with pt and appt made for her to see Truitt Merle, NP on Feb. 5, 2014 at 8:00.  She will call with INR results tomorrow and have these faxed to [place].

## 2012-12-12 ENCOUNTER — Encounter: Payer: Self-pay | Admitting: Cardiovascular Disease

## 2012-12-12 ENCOUNTER — Telehealth: Payer: Self-pay | Admitting: Cardiovascular Disease

## 2012-12-12 NOTE — Telephone Encounter (Signed)
Spoke with pt. She reports INR was 2.8 today and this will be faxed to our office.

## 2012-12-12 NOTE — Telephone Encounter (Signed)
New Problem    Calling to speak to [facility] regarding test results.

## 2012-12-16 NOTE — Telephone Encounter (Signed)
INR results from 1/31 received in office and will be scanned in.

## 2012-12-16 NOTE — Telephone Encounter (Signed)
Left message at [facility] for call back regarding INR done in their office on December 12, 2012.  These results have not been received in our office yet.

## 2012-12-17 ENCOUNTER — Ambulatory Visit (INDEPENDENT_AMBULATORY_CARE_PROVIDER_SITE_OTHER): Payer: Medicare Other | Admitting: Nurse Practitioner

## 2012-12-17 ENCOUNTER — Encounter: Payer: Self-pay | Admitting: Nurse Practitioner

## 2012-12-17 VITALS — BP 142/82 | HR 84 | Ht 69.0 in | Wt 305.0 lb

## 2012-12-17 DIAGNOSIS — I255 Ischemic cardiomyopathy: Secondary | ICD-10-CM

## 2012-12-17 DIAGNOSIS — I2589 Other forms of chronic ischemic heart disease: Secondary | ICD-10-CM

## 2012-12-17 NOTE — Patient Instructions (Signed)
Lets update your ultrasound of your heart  We are going to leave you in atrial fibrillation and keep you on your coumadin  See Dr. Angelena Form in October as planned  Call the [facility] office at [phone] if you have any questions, problems or concerns.

## 2012-12-17 NOTE — Progress Notes (Signed)
[name] Date of Birth: June 25, 1941 Medical Record [id number]  History of Present Illness: [name] is seen back today for a work in visit. She is seen for Dr. Angelena [name]. She has multiple medical issues which include CAD with past CABG back in 123XX123 - complicated by post op atrial fib and was on amiodarone and coumadin; She has had systolic CHF, HLD and DM. EF has improved from 30% to the 45 to 50% range. She was back in atrial fib back in April of last year. Did not wish to start anticoagulation at that time and was maintained on full dose ASA - then she proceeded to go with coumadin.   According to her EPIC chart, she has been having regular INRS. Was not ready for cardioversion back in October. Has now had 4 therapeutic INR's - which is followed at her PCP. These readings are 2.2, 2.1, 2.5 and 2.8 (weekly) since January 10th, 2014.  She comes in today. She is here alone. She is doing ok. She has no awareness of her rhythm. No chest pain. Not short of breath at rest. Some DOE which is stable. She is morbidly obese and very sedentary. Mostly limited by her back and knees. She is complaining more of tinnitus and being off balance. No swelling. No cough. She is seeing her PCP tomorrow for complete blood work. She is not wanting to have a cardioversion and is very apprehensive about this procedure.   Current Outpatient Prescriptions on File Prior to Visit  Medication Sig Dispense Refill  . aspirin EC 81 MG tablet Take 1 tablet (81 mg total) by mouth daily.  30 tablet  0  . carvedilol (COREG) 6.25 MG tablet Take 1 tablet (6.25 mg total) by mouth 2 (two) times daily with a meal.  60 tablet  6  . cyanocobalamin 2000 MCG tablet Take 2,000 mcg by mouth daily.        Marland Kitchen docusate sodium (COLACE) 100 MG capsule Take 100 mg by mouth daily as needed.        . insulin glargine (LANTUS) 100 UNIT/ML injection Inject 25 units into area at bedtime      . losartan (COZAAR) 25 MG tablet Take 25 mg by mouth  daily.        . nitroGLYCERIN (NITROSTAT) 0.4 MG SL tablet Place 0.4 mg under the tongue every 5 (five) minutes as needed. May repeat for up to 3 doses.       . potassium chloride SA (K-DUR,KLOR-CON) 20 MEQ tablet Take 20 mEq by mouth daily.        . simvastatin (ZOCOR) 40 MG tablet Take 40 mg by mouth at bedtime.        . torsemide (DEMADEX) 20 MG tablet Take 40 mg by mouth daily.        Marland Kitchen warfarin (COUMADIN) 7.5 MG tablet Take 7.5 mg by mouth as directed.         Allergies  Allergen Reactions  . Fe-Succ-C-Thre-B12-Des Stomach   . Spironolactone   . Sulfamethoxazole W-Trimethoprim     Past Medical History  Diagnosis Date  . CAD (coronary artery disease) of artery bypass graft 05/2008    S/P CABG in 2009  . Ischemic cardiomyopathy     EF 30%, improved to 45-50% by a recent echo  . Systolic heart failure   . Atrial fibrillation     Postoperative in 2009;  S/P transesophageal echocardiography-guided cardioversion, previously on Amiodarone and Coumadin; recurrent in April 2013  .  Diabetes mellitus   . Hyperlipidemia   . Chronic anticoagulation   . Obesities, morbid     Past Surgical History  Procedure Date  . Ventral hernia repair 10/1999    With mesh  . Total knee arthroplasty 1994    Right  . Appendectomy     At age 1    History  Smoking status  . Never Smoker   Smokeless tobacco  . Not on file    History  Alcohol Use No    Family History  Problem Relation Age of Onset  . Clotting disorder Mother     Cerebral hemorrhage  . Emphysema Father     COD  . Lung cancer Brother   . Heart disease Neg Hx     Review of Systems: The review of systems is per the HPI.  All other systems were reviewed and are negative.  Physical Exam: BP 142/82  Pulse 84  Ht 5\' 9"  (1.753 m)  Wt 305 lb (138.347 kg)  BMI 45.04 kg/m2  SpO2 98% Patient is very pleasant and in no acute distress. She is morbidly obese. Skin is warm and dry. Color is normal.  HEENT is unremarkable.  Normocephalic/atraumatic. PERRL. Sclera are nonicteric. Neck is supple. No masses. No JVD. Lungs are clear. Cardiac exam shows an irregular rhythm. Rate is well controlled. Abdomen is soft. Extremities are quite full but without edema. Gait and ROM are intact. No gross neurologic deficits noted.  LABORATORY DATA: EKG today shows atrial fib with a controlled VR.   Lab Results  Component Value Date   WBC 7.1 12/27/2009   HGB 12.7 12/27/2009   HCT 39.1 12/27/2009   PLT 193.0 12/27/2009   GLUCOSE 223* 04/18/2010   CHOL 148 10/12/2010   TRIG 110.0 10/12/2010   HDL 40.30 10/12/2010   LDLCALC 86 10/12/2010   ALT 14 05/05/2009   AST 19 05/05/2009   NA 138 04/18/2010   K 4.4 04/18/2010   CL 100 04/18/2010   CREATININE 1.2 04/18/2010   BUN 27* 04/18/2010   CO2 29 04/18/2010   TSH 2.96 05/05/2009   INR 2.8* 12/12/2012   HGBA1C  Value: 7.3 (NOTE)   The ADA recommends the following therapeutic goal for glycemic   control related to Hgb A1C measurement:   Goal of Therapy:   < 7.0% Hgb A1C   Reference: American Diabetes Association: Clinical Practice   Recommendations 2008, Diabetes Care,  2008, 31:(Suppl 1).* 05/17/2008    Echo Study Conclusions from 2011  - Left ventricle: There is significant dysynergy of the septum. I can not tell if this is a post-CABG finding,or atrue septal wall abnormality. The overall EF isin the 45% range. The cavity size was mildly dilated. Wall thickness was increased in a pattern of mild LVH. - Mitral valve: Mildly calcified annulus. Mild to moderate regurgitation. - Right ventricle: The cavity size was mildly dilated. Systolic function was mildly reduced. - Right atrium: The atrium was moderately dilated.  Assessment / Plan: 1. Atrial fib - rate is controlled. On Coumadin and has been therapeutic. She has been back in atrial fib almost one year now. She is not really wanting to have a cardioversion. She is not symptomatic and I do not think that trying to restore sinus rhythm is going  to make her feel better when she is not having symptoms. She will be committed to long term coumadin regardless due to her elevated CHADs. I suspect she would need antiarrhythmic therapy as well which she  is not interested in. I think rate control with anticoagulation is a sufficient choice for her.   2. ICM - last echo in 2011. Will go ahead and update. Seems compensated.  3. Tinnitus/unsteady gait - no falls reported. She is seeing her PCP tomorrow.   4. Morbid obesity  We will see her back as scheduled in October. No change in her medicines.   Patient is agreeable to this plan and will call if any problems develop in the interim.

## 2012-12-23 ENCOUNTER — Ambulatory Visit (HOSPITAL_COMMUNITY): Payer: Medicare Other | Attending: Cardiology | Admitting: Radiology

## 2012-12-23 DIAGNOSIS — Z6841 Body Mass Index (BMI) 40.0 and over, adult: Secondary | ICD-10-CM | POA: Insufficient documentation

## 2012-12-23 DIAGNOSIS — I255 Ischemic cardiomyopathy: Secondary | ICD-10-CM

## 2012-12-23 DIAGNOSIS — E785 Hyperlipidemia, unspecified: Secondary | ICD-10-CM | POA: Insufficient documentation

## 2012-12-23 DIAGNOSIS — I2589 Other forms of chronic ischemic heart disease: Secondary | ICD-10-CM | POA: Insufficient documentation

## 2012-12-23 DIAGNOSIS — E119 Type 2 diabetes mellitus without complications: Secondary | ICD-10-CM | POA: Insufficient documentation

## 2012-12-23 NOTE — Progress Notes (Signed)
Echocardiogram performed.  

## 2013-08-27 ENCOUNTER — Encounter: Payer: Self-pay | Admitting: Cardiovascular Disease

## 2013-08-27 ENCOUNTER — Ambulatory Visit (INDEPENDENT_AMBULATORY_CARE_PROVIDER_SITE_OTHER): Payer: Medicare Other | Admitting: Cardiovascular Disease

## 2013-08-27 VITALS — BP 116/68 | HR 77 | Ht 69.0 in | Wt 303.8 lb

## 2013-08-27 DIAGNOSIS — I255 Ischemic cardiomyopathy: Secondary | ICD-10-CM

## 2013-08-27 DIAGNOSIS — I4891 Unspecified atrial fibrillation: Secondary | ICD-10-CM

## 2013-08-27 DIAGNOSIS — I2589 Other forms of chronic ischemic heart disease: Secondary | ICD-10-CM

## 2013-08-27 DIAGNOSIS — I2510 Atherosclerotic heart disease of native coronary artery without angina pectoris: Secondary | ICD-10-CM

## 2013-08-27 NOTE — Progress Notes (Signed)
History of Present Illness: 72 yo female with history of CAD with CABG 123XX123 - complicated by post op atrial fib and was on amiodarone and coumadin; She has had systolic CHF, HLD and DM. Last echo 2/14 with LVEF 40-45%. She was back in atrial fib back in April 2013. Did not wish to start anticoagulation at that time and was maintained on full dose ASA - then she proceeded to go with coumadin. INR had been followed by her PCP. She refused cardioversion when seen by Truitt Merle, NP in February 2014.   Here today for follow up. She is feeling well. NO chest pain or SOB. NO change in weight. No palpitations.   Primary Care Physician: Dr. Lovette Cliche  Last Lipid Profile:Lipid Panel     Component Value Date/Time   CHOL 148 10/12/2010 1006   TRIG 110.0 10/12/2010 1006   HDL 40.30 10/12/2010 1006   CHOLHDL 4 10/12/2010 1006   VLDL 22.0 10/12/2010 1006   LDLCALC 86 10/12/2010 1006     Past Medical History  Diagnosis Date  . CAD (coronary artery disease) of artery bypass graft 05/2008    S/P CABG in 2009  . Ischemic cardiomyopathy     EF 30%, improved to 45-50% by a recent echo  . Systolic heart failure   . Atrial fibrillation     Postoperative in 2009;  S/P transesophageal echocardiography-guided cardioversion, previously on Amiodarone and Coumadin; recurrent in April 2013  . Diabetes mellitus   . Hyperlipidemia   . Chronic anticoagulation   . Obesities, morbid     Past Surgical History  Procedure Laterality Date  . Ventral hernia repair  10/1999    With mesh  . Total knee arthroplasty  1994    Right  . Appendectomy      At age 61    Current Outpatient Prescriptions  Medication Sig Dispense Refill  . carvedilol (COREG) 6.25 MG tablet Take 1 tablet (6.25 mg total) by mouth 2 (two) times daily with a meal.  60 tablet  6  . cyanocobalamin 2000 MCG tablet Take 2,000 mcg by mouth daily.        Marland Kitchen docusate sodium (COLACE) 100 MG capsule Take 100 mg by mouth daily as needed.          . insulin glargine (LANTUS) 100 UNIT/ML injection Inject 25 units into area at bedtime      . losartan (COZAAR) 25 MG tablet Take 25 mg by mouth daily.        . nitroGLYCERIN (NITROSTAT) 0.4 MG SL tablet Place 0.4 mg under the tongue every 5 (five) minutes as needed. May repeat for up to 3 doses.       . potassium chloride SA (K-DUR,KLOR-CON) 20 MEQ tablet Take 20 mEq by mouth daily.        . simvastatin (ZOCOR) 40 MG tablet Take 40 mg by mouth at bedtime.        . torsemide (DEMADEX) 20 MG tablet Take 40 mg by mouth daily.        Marland Kitchen warfarin (COUMADIN) 7.5 MG tablet Take 7.5 mg by mouth as directed.        No current facility-administered medications for this visit.    Allergies  Allergen Reactions  . Fe-Succ-C-Thre-B12-Des Stomach   . Spironolactone   . Sulfamethoxazole-Trimethoprim     History   Social History  . Marital Status: Married    Spouse Name: N/A    Number of Children: N/A  .  Years of Education: N/A   Occupational History  . Not on file.   Social History Main Topics  . Smoking status: Never Smoker   . Smokeless tobacco: Not on file  . Alcohol Use: No  . Drug Use: No  . Sexual Activity: No   Other Topics Concern  . Not on file   Social History Narrative   Widowed   Lives alone in [place]   2 children   Not routinely exercising    Family History  Problem Relation Age of Onset  . Clotting disorder Mother     Cerebral hemorrhage  . Emphysema Father     COD  . Lung cancer Brother   . Heart disease Neg Hx     Review of Systems:  As stated in the HPI and otherwise negative.   BP 116/68  Pulse 77  Ht 5\' 9"  (1.753 m)  Wt 303 lb 12.8 oz (137.803 kg)  BMI 44.84 kg/m2  Physical Examination: General: Well developed, well nourished, NAD HEENT: OP clear, mucus membranes moist SKIN: warm, dry. No rashes. Neuro: No focal deficits Musculoskeletal: Muscle strength 5/5 all ext Psychiatric: Mood and affect normal Neck: No JVD, no carotid bruits, no  thyromegaly, no lymphadenopathy. Lungs:Clear bilaterally, no wheezes, rhonci, crackles Cardiovascular: Regular rate and rhythm. No murmurs, gallops or rubs. Abdomen:Soft. Bowel sounds present. Non-tender.  Extremities: No lower extremity edema. Pulses are 2 + in the bilateral DP/PT.  EKG: Atrial fib, rate 77 bpm. Diffuse T wave abnormality.   Echo 12/23/12: Left ventricle: The cavity size was normal. Wall thickness was normal. Systolic function was mildly to moderately reduced. The estimated ejection fraction was in the range of 40% to 45%. - Left atrium: The atrium was moderately dilated. - Right ventricle: The cavity size was mildly dilated. Systolic function was mildly reduced. - Right atrium: The atrium was moderately dilated.  Assessment and Plan:   1. Atrial fib: Rate is controlled. On Coumadin and has been therapeutic. She is not symptomatic and I do not think that trying to restore sinus rhythm is going to make her feel better when she is not having symptoms. She will be committed to long term coumadin regardless due to her elevated CHADs score.  I think rate control with anticoagulation is a sufficient choice for her.   2. CAD: Stable. Continue current meds.   3. Ischemic cardiomyopathy: Stable. Continue medical therapy. Last LVEF 40-45%.

## 2013-08-27 NOTE — Patient Instructions (Signed)
Your physician wants you to follow-up in:  12 months.  You will receive a reminder letter in the mail two months in advance. If you don't receive a letter, please call our office to schedule the follow-up appointment.   

## 2014-09-17 ENCOUNTER — Encounter: Payer: Self-pay | Admitting: Cardiovascular Disease

## 2014-09-17 ENCOUNTER — Ambulatory Visit (INDEPENDENT_AMBULATORY_CARE_PROVIDER_SITE_OTHER): Payer: Medicare Other | Admitting: Cardiovascular Disease

## 2014-09-17 VITALS — BP 140/70 | HR 84 | Ht 69.0 in | Wt 301.8 lb

## 2014-09-17 DIAGNOSIS — I255 Ischemic cardiomyopathy: Secondary | ICD-10-CM

## 2014-09-17 DIAGNOSIS — I481 Persistent atrial fibrillation: Secondary | ICD-10-CM

## 2014-09-17 DIAGNOSIS — I4819 Other persistent atrial fibrillation: Secondary | ICD-10-CM

## 2014-09-17 DIAGNOSIS — I25810 Atherosclerosis of coronary artery bypass graft(s) without angina pectoris: Secondary | ICD-10-CM

## 2014-09-17 NOTE — Patient Instructions (Signed)
Your physician wants you to follow-up in:  12 months.  You will receive a reminder letter in the mail two months in advance. If you don't receive a letter, please call our office to schedule the follow-up appointment.   

## 2014-09-17 NOTE — Progress Notes (Signed)
History of Present Illness: 73 yo female with history of CAD with CABG 123XX123, PAF, systolic CHF, HLD, DM here today for cardiac follow up. Atrial fib was post-op. Last echo 2/14 with LVEF 40-45%. She was back in atrial fib back in April 2013. Did not wish to start anticoagulation at that time and was maintained on full dose ASA - then she proceeded to go with coumadin. INR had been followed by her PCP. She refused cardioversion when seen by Truitt Merle, NP in February 2014. Her Lasix dosage has been adjusted in Nephrology. Last creatinine 1.4.  Here today for follow up. She is feeling well. No chest pain or SOB. No palpitations. She has had no bleeding issues.   Primary Care Physician: Dr. Lovette Cliche  Last Lipid Profile: Followed in primary care.    Past Medical History  Diagnosis Date  . CAD (coronary artery disease) of artery bypass graft 05/2008    S/P CABG in 2009  . Ischemic cardiomyopathy     EF 30%, improved to 45-50% by a recent echo  . Systolic heart failure   . Atrial fibrillation     Postoperative in 2009;  S/P transesophageal echocardiography-guided cardioversion, previously on Amiodarone and Coumadin; recurrent in April 2013  . Diabetes mellitus   . Hyperlipidemia   . Chronic anticoagulation   . Obesities, morbid     Past Surgical History  Procedure Laterality Date  . Ventral hernia repair  10/1999    With mesh  . Total knee arthroplasty  1994    Right  . Appendectomy      At age 53    Current Outpatient Prescriptions  Medication Sig Dispense Refill  . ammonium lactate (LAC-HYDRIN) 12 % lotion     . carvedilol (COREG) 6.25 MG tablet Take 1 tablet (6.25 mg total) by mouth 2 (two) times daily with a meal. 60 tablet 6  . cholecalciferol (VITAMIN D) 1000 UNITS tablet Take 1,000 Units by mouth 2 (two) times daily.    . cyanocobalamin 2000 MCG tablet Take 2,000 mcg by mouth daily.      . diazepam (VALIUM) 5 MG tablet     . docusate sodium (COLACE) 100 MG  capsule Take 100 mg by mouth daily as needed.      . furosemide (LASIX) 40 MG tablet     . insulin glargine (LANTUS) 100 UNIT/ML injection Inject 25 units into area at bedtime    . losartan (COZAAR) 25 MG tablet Take 25 mg by mouth daily.      . nitroGLYCERIN (NITROSTAT) 0.4 MG SL tablet Place 0.4 mg under the tongue every 5 (five) minutes as needed. May repeat for up to 3 doses.     . ONE TOUCH ULTRA TEST test strip     . potassium chloride SA (K-DUR,KLOR-CON) 20 MEQ tablet Take 20 mEq by mouth daily.      Marland Kitchen PROAIR HFA 108 (90 BASE) MCG/ACT inhaler     . simvastatin (ZOCOR) 40 MG tablet Take 40 mg by mouth at bedtime.      Marland Kitchen warfarin (COUMADIN) 1 MG tablet     . warfarin (COUMADIN) 7.5 MG tablet Take 7.5 mg by mouth as directed.      No current facility-administered medications for this visit.    Allergies  Allergen Reactions  . Fe-Succ-C-Thre-B12-Des Stomach   . Spironolactone   . Sulfamethoxazole-Trimethoprim     History   Social History  . Marital Status: Married    Spouse Name:  N/A    Number of Children: N/A  . Years of Education: N/A   Occupational History  . Not on file.   Social History Main Topics  . Smoking status: Never Smoker   . Smokeless tobacco: Not on file  . Alcohol Use: No  . Drug Use: No  . Sexual Activity: No   Other Topics Concern  . Not on file   Social History Narrative   Widowed   Lives alone in [place]   2 children   Not routinely exercising    Family History  Problem Relation Age of Onset  . Clotting disorder Mother     Cerebral hemorrhage  . Emphysema Father     COD  . Lung cancer Brother   . Heart disease Neg Hx     Review of Systems:  As stated in the HPI and otherwise negative.   BP 140/70 mmHg  Pulse 84  Ht 5\' 9"  (1.753 m)  Wt 301 lb 12.8 oz (136.896 kg)  BMI 44.55 kg/m2  Physical Examination: General: Well developed, well nourished, NAD HEENT: OP clear, mucus membranes moist SKIN: warm, dry. No rashes. Neuro: No  focal deficits Musculoskeletal: Muscle strength 5/5 all ext Psychiatric: Mood and affect normal Neck: No JVD, no carotid bruits, no thyromegaly, no lymphadenopathy. Lungs:Clear bilaterally, no wheezes, rhonci, crackles Cardiovascular: Regular rate and rhythm. No murmurs, gallops or rubs. Abdomen:Soft. Bowel sounds present. Non-tender.  Extremities: No lower extremity edema. Pulses are 2 + in the bilateral DP/PT.  EKG: Atrial fib, rate 84 bpm. Diffuse T wave abnormality.   Echo 12/23/12: Left ventricle: The cavity size was normal. Wall thickness was normal. Systolic function was mildly to moderately reduced. The estimated ejection fraction was in the range of 40% to 45%. - Left atrium: The atrium was moderately dilated. - Right ventricle: The cavity size was mildly dilated. Systolic function was mildly reduced. - Right atrium: The atrium was moderately dilated.  Assessment and Plan:   1. Atrial fib: Rate is controlled. On Coumadin. She is not symptomatic and I do not think that trying to restore sinus rhythm is going to make her feel better when she is not having symptoms. She will be committed to long term coumadin regardless due to her elevated CHADs score. Will continue anti-coagulation with rate control.   2. CAD: Stable. Continue current meds.   3. Ischemic cardiomyopathy: Stable. Continue medical therapy. Last LVEF 40-45%. She is on Lasix.

## 2015-07-07 ENCOUNTER — Telehealth: Payer: Self-pay | Admitting: Cardiovascular Disease

## 2015-07-07 NOTE — Telephone Encounter (Signed)
Spoke with Levada Dy at [facility] family [facility]. Pt is scheduled for colonoscopy by Dr. Lyda Jester on September 13,2016.  They are asking if pt can stop coumadin on September 8,2016 and if she needs lovenox bridging.  They will fax request to our office for Dr. Angelena Form to complete.  Coumadin/INR is followed in primary care

## 2015-07-07 NOTE — Telephone Encounter (Signed)
New message    Request for surgical clearance:  1. What type of surgery is being performed? PCP office not sure what type of procedure is being done  2. When is this surgery scheduled? September 13  3. Are there any medications that need to be held prior to surgery and how long?Coumadin   4. Name of physician performing surgery? Dr Lyda Jester  5. What is your office phone and fax number? PCP Ofc # [id number]  PCP is calling for Dr Misenheimer's office ([facility] to request surgical clearance I informed PCP that GI doctor's office needed to request clearance

## 2015-07-11 NOTE — Telephone Encounter (Signed)
I spoke with office and Levada Dy is not in office today.  Office fax number is [phone].  Will fax this note to her.

## 2015-07-11 NOTE — Telephone Encounter (Signed)
Form has not been received yet in office.  Will forward this note to Dr. Angelena Form to advise regarding coumadin.

## 2015-07-11 NOTE — Telephone Encounter (Signed)
She can hold her coumadin 5 days before her planned procedure and does not need Lovenox bridging. cdm

## 2015-07-12 ENCOUNTER — Telehealth: Payer: Self-pay | Admitting: Cardiovascular Disease

## 2015-07-12 NOTE — Telephone Encounter (Signed)
Spoke with Otila Kluver and confirmed fax number below.   They have received note (see previous phone note) indicating pt can stop coumadin prior to colonoscopy.  They are asking if pt can also hold aspirin 5 days prior to colonoscopy

## 2015-07-12 NOTE — Telephone Encounter (Signed)
New message    Request for surgical clearance:  1. What type of surgery is being performed? Colonoscopy  2. When is this surgery scheduled? September 13  3. Are there any medications that need to be held prior to surgery and how long?Aspirin 5 days  4. Name of physician performing surgery? Dr Lyda Jester  5. What is your office phone and fax number? Ofc [phone]    Fax [phone]

## 2015-07-13 NOTE — Telephone Encounter (Signed)
Tina notified and this note faxed to her.

## 2015-07-13 NOTE — Telephone Encounter (Signed)
The patient can also hold ASA 5 days prior to the colonoscopy. cdm

## 2015-10-13 NOTE — Progress Notes (Signed)
Chief Complaint  Patient presents with  . Follow-up    12 month f/u pt has no complaints     History of Present Illness: 74 yo female with history of CAD with CABG 2009, ischemic cardiomyopathy, persistent atrial fibrillation, systolic CHF, HLD, DM, CKD (stage 3)here today for cardiac follow up. Echo 2/14 with LVEF 40-45%. She was back in atrial fib back in April 2013 and was started on coumadin. INR had been followed by her PCP. She refused cardioversion in the past. Her Lasix dosage has been adjusted in Nephrology.   Here today for follow up. She is feeling well. No chest pain or SOB. No palpitations. She has had no bleeding issues.   Primary Care Physician: Daphene Jaeger, PA-C [facility])  Last Lipid Profile: Followed in primary care.    Past Medical History  Diagnosis Date  . CAD (coronary artery disease) of artery bypass graft 05/2008    S/P CABG in 2009  . Ischemic cardiomyopathy   . Systolic heart failure   . Atrial fibrillation ([place])     Postoperative in 2009;  S/P transesophageal echocardiography-guided cardioversion, previously on Amiodarone and Coumadin; recurrent in April 2013  . Diabetes mellitus   . Hyperlipidemia   . Chronic anticoagulation   . Obesities, morbid [facility])     Past Surgical History  Procedure Laterality Date  . Ventral hernia repair  10/1999    With mesh  . Total knee arthroplasty  1994    Right  . Appendectomy      At age 69    Current Outpatient Prescriptions  Medication Sig Dispense Refill  . carvedilol (COREG) 6.25 MG tablet Take 1 tablet (6.25 mg total) by mouth 2 (two) times daily with a meal. 60 tablet 6  . cholecalciferol (VITAMIN D) 1000 UNITS tablet Take 1,000 Units by mouth 2 (two) times daily.    Marland Kitchen docusate sodium (COLACE) 100 MG capsule Take 100 mg by mouth daily as needed.      . furosemide (LASIX) 20 MG tablet Take 20 mg by mouth daily.    . furosemide (LASIX) 40 MG tablet     . gabapentin (NEURONTIN) 300 MG capsule Take 300  mg by mouth daily.    . insulin glargine (LANTUS) 100 UNIT/ML injection Inject 35 Units into the skin daily. Inject 25 units into area at bedtime    . LANTUS SOLOSTAR 100 UNIT/ML Solostar Pen     . losartan (COZAAR) 25 MG tablet Take 25 mg by mouth daily.      . nitroGLYCERIN (NITROSTAT) 0.4 MG SL tablet Place 0.4 mg under the tongue every 5 (five) minutes as needed. May repeat for up to 3 doses.     . potassium chloride SA (K-DUR,KLOR-CON) 20 MEQ tablet Take 20 mEq by mouth daily.      Marland Kitchen rOPINIRole (REQUIP) 0.25 MG tablet Take 0.25 mg by mouth 2 (two) times daily.     . simvastatin (ZOCOR) 40 MG tablet Take 40 mg by mouth at bedtime.      . terbinafine (LAMISIL) 250 MG tablet Take 1 tablet by mouth daily.    Marland Kitchen warfarin (COUMADIN) 5 MG tablet Take 5 mg by mouth. As prescribed by coumadin clinic     No current facility-administered medications for this visit.    Allergies  Allergen Reactions  . Fe-Succ-C-Thre-B12-Des Stomach Other (See Comments)    Pt doesn't remember   . Spironolactone Other (See Comments)    Dizziness   . Sulfamethoxazole-Trimethoprim Other (See  Comments)    Pt doesn't remember    Social History   Social History  . Marital Status: Married    Spouse Name: N/A  . Number of Children: N/A  . Years of Education: N/A   Occupational History  . Not on file.   Social History Main Topics  . Smoking status: Never Smoker   . Smokeless tobacco: Not on file  . Alcohol Use: No  . Drug Use: No  . Sexual Activity: No   Other Topics Concern  . Not on file   Social History Narrative   Widowed   Lives alone in [place]   2 children   Not routinely exercising    Family History  Problem Relation Age of Onset  . Clotting disorder Mother     Cerebral hemorrhage  . Emphysema Father     COD  . Lung cancer Brother   . Heart disease Neg Hx     Review of Systems:  As stated in the HPI and otherwise negative.   BP 110/70 mmHg  Pulse 69  Resp 12  Ht 5\' 9"   (1.753 m)  Wt 323 lb 3.2 oz (146.603 kg)  BMI 47.71 kg/m2  SpO2 98%  Physical Examination: General: Well developed, well nourished, NAD HEENT: OP clear, mucus membranes moist SKIN: warm, dry. No rashes. Neuro: No focal deficits Musculoskeletal: Muscle strength 5/5 all ext Psychiatric: Mood and affect normal Neck: No JVD, no carotid bruits, no thyromegaly, no lymphadenopathy. Lungs:Clear bilaterally, no wheezes, rhonci, crackles Cardiovascular: Regular rate and rhythm. No murmurs, gallops or rubs. Abdomen:Soft. Bowel sounds present. Non-tender.  Extremities: No lower extremity edema. Pulses are 2 + in the bilateral DP/PT.  Echo 12/23/12: Left ventricle: The cavity size was normal. Wall thickness was normal. Systolic function was mildly to moderately reduced. The estimated ejection fraction was in the range of 40% to 45%. - Left atrium: The atrium was moderately dilated. - Right ventricle: The cavity size was mildly dilated. Systolic function was mildly reduced. - Right atrium: The atrium was moderately dilated.  EKG:  EKG is ordered today. The ekg ordered today demonstrates Atrial fibrillation, rate 69 bpm. T wave abn.   Recent Labs: No results found for requested labs within last 365 days.     Wt Readings from Last 3 Encounters:  10/14/15 323 lb 3.2 oz (146.603 kg)  09/17/14 301 lb 12.8 oz (136.896 kg)  08/27/13 303 lb 12.8 oz (137.803 kg)     Other studies Reviewed: Additional studies/ records that were reviewed today include: . Review of the above records demonstrates:    Assessment and Plan:   1. Atrial fibrillation, persistent: Rate is controlled today on beta blocker. She is on coumadin with no bleeding issues. She is not symptomatic and I do not think that trying to restore sinus rhythm is going to make her feel better when she is not having symptoms. Will continue anti-coagulation with rate control.   2. CAD: Stable. Continue current meds including beta blocker  and statin.   3. Ischemic cardiomyopathy: Stable. Continue medical therapy with beta blocker and ARB. Last LVEF 40-45%. She is on Lasix.   Current medicines are reviewed at length with the patient today.  The patient does not have concerns regarding medicines.  The following changes have been made:  no change  Labs/ tests ordered today include:   Orders Placed This Encounter  Procedures  . EKG 12-Lead    Disposition:   FU with me in 12  months  Signed, Lauree Chandler, MD 10/14/2015 9:41 AM    [facility] [place], [place], [place]  [address] Phone: [phone]; Fax: [phone]

## 2015-10-14 ENCOUNTER — Encounter: Payer: Self-pay | Admitting: Cardiovascular Disease

## 2015-10-14 ENCOUNTER — Ambulatory Visit (INDEPENDENT_AMBULATORY_CARE_PROVIDER_SITE_OTHER): Payer: Medicare Other | Admitting: Cardiovascular Disease

## 2015-10-14 VITALS — BP 110/70 | HR 69 | Resp 12 | Ht 69.0 in | Wt 323.2 lb

## 2015-10-14 DIAGNOSIS — I25810 Atherosclerosis of coronary artery bypass graft(s) without angina pectoris: Secondary | ICD-10-CM | POA: Diagnosis not present

## 2015-10-14 DIAGNOSIS — I255 Ischemic cardiomyopathy: Secondary | ICD-10-CM

## 2015-10-14 DIAGNOSIS — I481 Persistent atrial fibrillation: Secondary | ICD-10-CM

## 2015-10-14 DIAGNOSIS — I4819 Other persistent atrial fibrillation: Secondary | ICD-10-CM

## 2015-10-14 NOTE — Patient Instructions (Signed)

## 2016-03-30 ENCOUNTER — Emergency Department (HOSPITAL_COMMUNITY): Payer: Medicare Other

## 2016-03-30 ENCOUNTER — Encounter (HOSPITAL_COMMUNITY): Payer: Medicare Other

## 2016-03-30 ENCOUNTER — Observation Stay (HOSPITAL_BASED_OUTPATIENT_CLINIC_OR_DEPARTMENT_OTHER): Payer: Medicare Other

## 2016-03-30 ENCOUNTER — Observation Stay (HOSPITAL_COMMUNITY): Payer: Medicare Other

## 2016-03-30 ENCOUNTER — Encounter (HOSPITAL_COMMUNITY): Payer: Self-pay | Admitting: Emergency Medicine

## 2016-03-30 ENCOUNTER — Inpatient Hospital Stay (HOSPITAL_COMMUNITY)
Admission: EM | Admit: 2016-03-30 | Discharge: 2016-04-04 | DRG: 064 | Disposition: A | Discharge status: Rehabilitation Facility | Payer: Medicare Other | Attending: Internal Medicine | Admitting: Internal Medicine

## 2016-03-30 DIAGNOSIS — I447 Left bundle-branch block, unspecified: Secondary | ICD-10-CM | POA: Diagnosis present

## 2016-03-30 DIAGNOSIS — G8194 Hemiplegia, unspecified affecting left nondominant side: Secondary | ICD-10-CM | POA: Diagnosis present

## 2016-03-30 DIAGNOSIS — I5022 Chronic systolic (congestive) heart failure: Secondary | ICD-10-CM | POA: Diagnosis present

## 2016-03-30 DIAGNOSIS — I639 Cerebral infarction, unspecified: Secondary | ICD-10-CM | POA: Diagnosis not present

## 2016-03-30 DIAGNOSIS — Z9841 Cataract extraction status, right eye: Secondary | ICD-10-CM

## 2016-03-30 DIAGNOSIS — I255 Ischemic cardiomyopathy: Secondary | ICD-10-CM | POA: Diagnosis present

## 2016-03-30 DIAGNOSIS — N183 Chronic kidney disease, stage 3 unspecified: Secondary | ICD-10-CM

## 2016-03-30 DIAGNOSIS — Z794 Long term (current) use of insulin: Secondary | ICD-10-CM

## 2016-03-30 DIAGNOSIS — E1122 Type 2 diabetes mellitus with diabetic chronic kidney disease: Secondary | ICD-10-CM | POA: Diagnosis present

## 2016-03-30 DIAGNOSIS — Z955 Presence of coronary angioplasty implant and graft: Secondary | ICD-10-CM

## 2016-03-30 DIAGNOSIS — I6789 Other cerebrovascular disease: Secondary | ICD-10-CM

## 2016-03-30 DIAGNOSIS — I481 Persistent atrial fibrillation: Secondary | ICD-10-CM | POA: Diagnosis not present

## 2016-03-30 DIAGNOSIS — I25810 Atherosclerosis of coronary artery bypass graft(s) without angina pectoris: Secondary | ICD-10-CM | POA: Diagnosis present

## 2016-03-30 DIAGNOSIS — E119 Type 2 diabetes mellitus without complications: Secondary | ICD-10-CM

## 2016-03-30 DIAGNOSIS — R531 Weakness: Secondary | ICD-10-CM | POA: Diagnosis not present

## 2016-03-30 DIAGNOSIS — I619 Nontraumatic intracerebral hemorrhage, unspecified: Secondary | ICD-10-CM | POA: Diagnosis present

## 2016-03-30 DIAGNOSIS — I69922 Dysarthria following unspecified cerebrovascular disease: Secondary | ICD-10-CM | POA: Diagnosis not present

## 2016-03-30 DIAGNOSIS — R29810 Facial weakness: Secondary | ICD-10-CM | POA: Diagnosis present

## 2016-03-30 DIAGNOSIS — I5043 Acute on chronic combined systolic (congestive) and diastolic (congestive) heart failure: Secondary | ICD-10-CM | POA: Insufficient documentation

## 2016-03-30 DIAGNOSIS — N184 Chronic kidney disease, stage 4 (severe): Secondary | ICD-10-CM

## 2016-03-30 DIAGNOSIS — Z96651 Presence of right artificial knee joint: Secondary | ICD-10-CM | POA: Diagnosis present

## 2016-03-30 DIAGNOSIS — I482 Chronic atrial fibrillation, unspecified: Secondary | ICD-10-CM

## 2016-03-30 DIAGNOSIS — R4781 Slurred speech: Secondary | ICD-10-CM | POA: Diagnosis present

## 2016-03-30 DIAGNOSIS — I5042 Chronic combined systolic (congestive) and diastolic (congestive) heart failure: Secondary | ICD-10-CM | POA: Diagnosis present

## 2016-03-30 DIAGNOSIS — E1142 Type 2 diabetes mellitus with diabetic polyneuropathy: Secondary | ICD-10-CM | POA: Insufficient documentation

## 2016-03-30 DIAGNOSIS — I6930 Unspecified sequelae of cerebral infarction: Secondary | ICD-10-CM

## 2016-03-30 DIAGNOSIS — I130 Hypertensive heart and chronic kidney disease with heart failure and stage 1 through stage 4 chronic kidney disease, or unspecified chronic kidney disease: Secondary | ICD-10-CM | POA: Diagnosis present

## 2016-03-30 DIAGNOSIS — I10 Essential (primary) hypertension: Secondary | ICD-10-CM

## 2016-03-30 DIAGNOSIS — M545 Low back pain: Secondary | ICD-10-CM | POA: Diagnosis present

## 2016-03-30 DIAGNOSIS — D696 Thrombocytopenia, unspecified: Secondary | ICD-10-CM | POA: Insufficient documentation

## 2016-03-30 DIAGNOSIS — E785 Hyperlipidemia, unspecified: Secondary | ICD-10-CM | POA: Diagnosis present

## 2016-03-30 DIAGNOSIS — I2510 Atherosclerotic heart disease of native coronary artery without angina pectoris: Secondary | ICD-10-CM | POA: Diagnosis present

## 2016-03-30 DIAGNOSIS — I4891 Unspecified atrial fibrillation: Secondary | ICD-10-CM | POA: Diagnosis present

## 2016-03-30 DIAGNOSIS — Z7982 Long term (current) use of aspirin: Secondary | ICD-10-CM

## 2016-03-30 DIAGNOSIS — I63411 Cerebral infarction due to embolism of right middle cerebral artery: Secondary | ICD-10-CM | POA: Diagnosis not present

## 2016-03-30 DIAGNOSIS — I6389 Other cerebral infarction: Secondary | ICD-10-CM

## 2016-03-30 DIAGNOSIS — R0682 Tachypnea, not elsewhere classified: Secondary | ICD-10-CM | POA: Insufficient documentation

## 2016-03-30 DIAGNOSIS — Z8673 Personal history of transient ischemic attack (TIA), and cerebral infarction without residual deficits: Secondary | ICD-10-CM

## 2016-03-30 DIAGNOSIS — Z6841 Body Mass Index (BMI) 40.0 and over, adult: Secondary | ICD-10-CM

## 2016-03-30 DIAGNOSIS — G8929 Other chronic pain: Secondary | ICD-10-CM | POA: Diagnosis present

## 2016-03-30 DIAGNOSIS — N390 Urinary tract infection, site not specified: Secondary | ICD-10-CM

## 2016-03-30 DIAGNOSIS — R791 Abnormal coagulation profile: Secondary | ICD-10-CM

## 2016-03-30 DIAGNOSIS — Z7901 Long term (current) use of anticoagulants: Secondary | ICD-10-CM

## 2016-03-30 HISTORY — DX: Heart failure, unspecified: I50.9

## 2016-03-30 HISTORY — DX: Other chronic pain: G89.29

## 2016-03-30 HISTORY — DX: Chronic kidney disease, stage 3 (moderate): N18.3

## 2016-03-30 HISTORY — DX: Chronic kidney disease, stage 3 unspecified: N18.30

## 2016-03-30 HISTORY — DX: Low back pain, unspecified: M54.50

## 2016-03-30 HISTORY — DX: Essential (primary) hypertension: I10

## 2016-03-30 HISTORY — DX: Low back pain: M54.5

## 2016-03-30 HISTORY — DX: Unspecified osteoarthritis, unspecified site: M19.90

## 2016-03-30 LAB — RAPID URINE DRUG SCREEN, HOSP PERFORMED
Amphetamines: NOT DETECTED
Barbiturates: NOT DETECTED
Benzodiazepines: NOT DETECTED
Cocaine: NOT DETECTED
OPIATES: NOT DETECTED
TETRAHYDROCANNABINOL: NOT DETECTED

## 2016-03-30 LAB — DIFFERENTIAL
BASOS ABS: 0 10*3/uL (ref 0.0–0.1)
BASOS PCT: 0 %
EOS PCT: 5 %
Eosinophils Absolute: 0.3 10*3/uL (ref 0.0–0.7)
LYMPHS ABS: 1.6 10*3/uL (ref 0.7–4.0)
LYMPHS PCT: 26 %
Monocytes Absolute: 0.7 10*3/uL (ref 0.1–1.0)
Monocytes Relative: 12 %
NEUTROS ABS: 3.4 10*3/uL (ref 1.7–7.7)
NEUTROS PCT: 57 %

## 2016-03-30 LAB — CBC
HCT: 40.6 % (ref 36.0–46.0)
HEMOGLOBIN: 12.6 g/dL (ref 12.0–15.0)
MCH: 28.4 pg (ref 26.0–34.0)
MCHC: 31 g/dL (ref 30.0–36.0)
MCV: 91.4 fL (ref 78.0–100.0)
PLATELETS: 130 10*3/uL — AB (ref 150–400)
RBC: 4.44 MIL/uL (ref 3.87–5.11)
RDW: 15.4 % (ref 11.5–15.5)
WBC: 6 10*3/uL (ref 4.0–10.5)

## 2016-03-30 LAB — COMPREHENSIVE METABOLIC PANEL
ALK PHOS: 100 U/L (ref 38–126)
ALT: 14 U/L (ref 14–54)
ANION GAP: 11 (ref 5–15)
AST: 24 U/L (ref 15–41)
Albumin: 3.4 g/dL — ABNORMAL LOW (ref 3.5–5.0)
BUN: 28 mg/dL — ABNORMAL HIGH (ref 6–20)
CALCIUM: 9.1 mg/dL (ref 8.9–10.3)
CHLORIDE: 103 mmol/L (ref 101–111)
CO2: 27 mmol/L (ref 22–32)
Creatinine, Ser: 1.32 mg/dL — ABNORMAL HIGH (ref 0.44–1.00)
GFR calc non Af Amer: 38 mL/min — ABNORMAL LOW (ref >60)
GFR, EST AFRICAN AMERICAN: 44 mL/min — AB (ref >60)
Glucose, Bld: 101 mg/dL — ABNORMAL HIGH (ref 65–99)
Potassium: 4 mmol/L (ref 3.5–5.1)
SODIUM: 141 mmol/L (ref 135–145)
Total Bilirubin: 0.9 mg/dL (ref 0.3–1.2)
Total Protein: 7 g/dL (ref 6.5–8.1)

## 2016-03-30 LAB — LIPID PANEL
Cholesterol: 102 mg/dL (ref 0–200)
HDL: 39 mg/dL — AB (ref >40)
LDL CALC: 53 mg/dL (ref 0–99)
TRIGLYCERIDES: 50 mg/dL (ref <150)
Total CHOL/HDL Ratio: 2.6 RATIO
VLDL: 10 mg/dL (ref 0–40)

## 2016-03-30 LAB — URINALYSIS, ROUTINE W REFLEX MICROSCOPIC
Bilirubin Urine: NEGATIVE
Glucose, UA: NEGATIVE mg/dL
KETONES UR: NEGATIVE mg/dL
NITRITE: POSITIVE — AB
PH: 7 (ref 5.0–8.0)
Protein, ur: 30 mg/dL — AB
Specific Gravity, Urine: 1.025 (ref 1.005–1.030)

## 2016-03-30 LAB — CBG MONITORING, ED
Glucose-Capillary: 104 mg/dL — ABNORMAL HIGH (ref 65–99)
Glucose-Capillary: 90 mg/dL (ref 65–99)

## 2016-03-30 LAB — PROTIME-INR
INR: 1.59 — AB (ref 0.00–1.49)
PROTHROMBIN TIME: 19 s — AB (ref 11.6–15.2)

## 2016-03-30 LAB — GLUCOSE, CAPILLARY
Glucose-Capillary: 72 mg/dL (ref 65–99)
Glucose-Capillary: 164 mg/dL — ABNORMAL HIGH (ref 65–99)
Glucose-Capillary: 139 mg/dL — ABNORMAL HIGH (ref 65–99)

## 2016-03-30 LAB — URINE MICROSCOPIC-ADD ON

## 2016-03-30 LAB — ETHANOL: Alcohol, Ethyl (B): <5 mg/dL (ref <5)

## 2016-03-30 LAB — ECHOCARDIOGRAM COMPLETE
Height: 68 in
Weight: 4800 oz

## 2016-03-30 LAB — TROPONIN I

## 2016-03-30 LAB — APTT: aPTT: 27 seconds (ref 24–37)

## 2016-03-30 MED ORDER — SIMVASTATIN 40 MG PO TABS
40.0000 mg | ORAL_TABLET | Freq: Every day | ORAL | Status: DC
  Administered 2016-03-30: 40 mg via ORAL
  Filled 2016-03-30: qty 1

## 2016-03-30 MED ORDER — SODIUM CHLORIDE 0.9 % IV SOLN
INTRAVENOUS | Status: DC
  Administered 2016-03-30: 08:00:00 via INTRAVENOUS

## 2016-03-30 MED ORDER — SODIUM CHLORIDE 0.9% FLUSH: 3.0000 mL | INTRAVENOUS | Status: DC | PRN

## 2016-03-30 MED ORDER — ASPIRIN 300 MG RE SUPP
300.0000 mg | Freq: Once | RECTAL | Status: AC
  Administered 2016-03-30: 300 mg via RECTAL
  Filled 2016-03-30: qty 1

## 2016-03-30 MED ORDER — SODIUM CHLORIDE 0.9 % IV SOLN
INTRAVENOUS | Status: DC
  Administered 2016-03-30 – 2016-03-31 (×2): via INTRAVENOUS

## 2016-03-30 MED ORDER — GABAPENTIN 400 MG PO CAPS
400.0000 mg | ORAL_CAPSULE | Freq: Every day | ORAL | Status: DC
  Administered 2016-03-30 – 2016-04-03 (×5): 400 mg via ORAL
  Filled 2016-03-30 (×5): qty 1

## 2016-03-30 MED ORDER — PERFLUTREN LIPID MICROSPHERE
1.0000 mL | INTRAVENOUS | Status: AC | PRN
  Administered 2016-03-30: 2 mL via INTRAVENOUS
  Filled 2016-03-30: qty 10

## 2016-03-30 MED ORDER — ROPINIROLE HCL 1 MG PO TABS: 1.0000 mg | ORAL_TABLET | Freq: Every day | ORAL | Status: DC

## 2016-03-30 MED ORDER — IOPAMIDOL (ISOVUE-370) INJECTION 76%
INTRAVENOUS | Status: AC
  Administered 2016-03-30: 50 mL
  Filled 2016-03-30: qty 50

## 2016-03-30 MED ORDER — SODIUM CHLORIDE 0.9% FLUSH
3.0000 mL | Freq: Two times a day (BID) | INTRAVENOUS | Status: DC
  Administered 2016-03-30 – 2016-04-04 (×8): 3 mL via INTRAVENOUS

## 2016-03-30 MED ORDER — INSULIN ASPART 100 UNIT/ML ~~LOC~~ SOLN
0.0000 [IU] | Freq: Three times a day (TID) | SUBCUTANEOUS | Status: DC
  Administered 2016-03-30 – 2016-03-31 (×2): 2 [IU] via SUBCUTANEOUS
  Administered 2016-04-01: 3 [IU] via SUBCUTANEOUS
  Administered 2016-04-01: 1 [IU] via SUBCUTANEOUS
  Administered 2016-04-01: 3 [IU] via SUBCUTANEOUS
  Administered 2016-04-02: 2 [IU] via SUBCUTANEOUS
  Administered 2016-04-02 – 2016-04-03 (×2): 1 [IU] via SUBCUTANEOUS
  Administered 2016-04-03: 2 [IU] via SUBCUTANEOUS
  Administered 2016-04-04: 1 [IU] via SUBCUTANEOUS

## 2016-03-30 MED ORDER — STROKE: EARLY STAGES OF RECOVERY BOOK
Freq: Once | Status: AC
  Administered 2016-03-30: 17:00:00
  Filled 2016-03-30: qty 1

## 2016-03-30 MED ORDER — DOCUSATE SODIUM 100 MG PO CAPS
100.0000 mg | ORAL_CAPSULE | Freq: Two times a day (BID) | ORAL | Status: DC
  Administered 2016-03-30 – 2016-04-04 (×11): 100 mg via ORAL
  Filled 2016-03-30 (×11): qty 1

## 2016-03-30 MED ORDER — INSULIN GLARGINE 100 UNIT/ML ~~LOC~~ SOLN
20.0000 [IU] | Freq: Every day | SUBCUTANEOUS | Status: DC
  Administered 2016-03-30: 20 [IU] via SUBCUTANEOUS
  Filled 2016-03-30 (×3): qty 0.2

## 2016-03-30 NOTE — ED Notes (Signed)
IV team unable to obtain 18g, per CT and Neuro proceed with CTA at this time.

## 2016-03-30 NOTE — Progress Notes (Signed)
ED report received at 1045 and pt arrived to the unit via stretcher at 1115. Pt A&O x4; VSS, telemetry applied and verified with CCMD; IV intact and transfusing; pt oriented to the unit and room; pt fall/safety precaution and prevention education completed with pt and family. Pt in bed with call light within reach. Will closely monitor pt. Delia Heady RN

## 2016-03-30 NOTE — ED Provider Notes (Signed)
CSN: [id number]     Arrival date & time 03/30/16  0551 History   First MD Initiated Contact with Patient 03/30/16 [phone]     Chief Complaint  Patient presents with  . Cerebrovascular Accident  . Facial Droop  . Extremity Weakness    LUE & LLE     (Consider location/radiation/quality/duration/timing/severity/associated sxs/prior Treatment) Patient is a 75 y.o. female presenting with Acute Neurological Problem and extremity weakness. The history is provided by the patient and the EMS personnel.  Cerebrovascular Accident  Extremity Weakness  She has a history of atrial fibrillation and ischemic cardiomyopathy and is anticoagulated on warfarin. She was last seen normal at 9 PM. This morning, her son noted that she had a left-sided facial droop and had weakness of her left arm and 911 was called. She denies any headache or chest pain. She is actually not aware of any weakness.  Past Medical History  Diagnosis Date  . CAD (coronary artery disease) of artery bypass graft 05/2008    S/P CABG in 2009  . Ischemic cardiomyopathy   . Systolic heart failure   . Atrial fibrillation ([place])     Postoperative in 2009;  S/P transesophageal echocardiography-guided cardioversion, previously on Amiodarone and Coumadin; recurrent in April 2013  . Diabetes mellitus   . Hyperlipidemia   . Chronic anticoagulation   . Obesities, morbid [facility])    Past Surgical History  Procedure Laterality Date  . Ventral hernia repair  10/1999    With mesh  . Total knee arthroplasty  1994    Right  . Appendectomy      At age 24   Family History  Problem Relation Age of Onset  . Clotting disorder Mother     Cerebral hemorrhage  . Emphysema Father     COD  . Lung cancer Brother   . Heart disease Neg Hx    Social History  Substance Use Topics  . Smoking status: Never Smoker   . Smokeless tobacco: None  . Alcohol Use: No   OB History    No data available     Review of Systems  Musculoskeletal: Positive for  extremity weakness.  All other systems reviewed and are negative.     Allergies  Fe-succ-c-thre-b12-des stomach; Spironolactone; and Sulfamethoxazole-trimethoprim  Home Medications   Prior to Admission medications   Medication Sig Start Date End Date Taking? Authorizing Provider  carvedilol (COREG) 6.25 MG tablet Take 1 tablet (6.25 mg total) by mouth 2 (two) times daily with a meal. 03/04/12   Burnell Blanks, MD  cholecalciferol (VITAMIN D) 1000 UNITS tablet Take 1,000 Units by mouth 2 (two) times daily.    Historical Provider, MD  docusate sodium (COLACE) 100 MG capsule Take 100 mg by mouth daily as needed.      Historical Provider, MD  furosemide (LASIX) 20 MG tablet Take 20 mg by mouth daily. 09/20/15   Historical Provider, MD  furosemide (LASIX) 40 MG tablet  08/09/14   Historical Provider, MD  gabapentin (NEURONTIN) 300 MG capsule Take 300 mg by mouth daily. 09/26/15   Historical Provider, MD  insulin glargine (LANTUS) 100 UNIT/ML injection Inject 35 Units into the skin daily. Inject 25 units into area at bedtime    Historical Provider, MD  LANTUS SOLOSTAR 100 UNIT/ML Solostar Pen  08/29/15   Historical Provider, MD  losartan (COZAAR) 25 MG tablet Take 25 mg by mouth daily.      Historical Provider, MD  nitroGLYCERIN (NITROSTAT) 0.4 MG SL tablet Place  0.4 mg under the tongue every 5 (five) minutes as needed. May repeat for up to 3 doses.     Historical Provider, MD  potassium chloride SA (K-DUR,KLOR-CON) 20 MEQ tablet Take 20 mEq by mouth daily.      Historical Provider, MD  rOPINIRole (REQUIP) 0.25 MG tablet Take 0.25 mg by mouth 2 (two) times daily.  08/01/15   Historical Provider, MD  simvastatin (ZOCOR) 40 MG tablet Take 40 mg by mouth at bedtime.      Historical Provider, MD  terbinafine (LAMISIL) 250 MG tablet Take 1 tablet by mouth daily. 09/26/15   Historical Provider, MD  warfarin (COUMADIN) 5 MG tablet Take 5 mg by mouth. As prescribed by coumadin clinic 10/04/15    Historical Provider, MD   BP 136/64 mmHg  Pulse 68  Temp(Src) 97.6 F (36.4 C) (Oral)  Resp 17  Ht 5\' 8"  (1.727 m)  Wt 300 lb (136.079 kg)  BMI 45.63 kg/m2  SpO2 98% Physical Exam  Nursing note and vitals reviewed.  75 year old female, resting comfortably and in no acute distress. Vital signs are normal. Oxygen saturation is 98%, which is normal. Head is normocephalic and atraumatic. PERRLA, EOMI. Oropharynx is clear. Neck is nontender and supple without adenopathy or JVD. Back is nontender and there is no CVA tenderness. Lungs are clear without rales, wheezes, or rhonchi. Chest is nontender. Sternotomy scar is present and well-healed. Heart has an irregular rhythm without murmur. Abdomen is soft, flat, nontender without masses or hepatosplenomegaly and peristalsis is normoactive. Extremities have 2-3+ edema, full range of motion is present. Skin is warm and dry without rash. Neurologic: She is awake and alert and oriented to person, place, time. Speech is spontaneous and fluent. Cranial nerves are significant for left central facial droop and tongue protruding to the left. There is a moderate to severe left hemi-para cyst with strength 3/5 and both arms and legs. No Babinski reflex is present.  ED Course  Procedures (including critical care time) Labs Review Results for orders placed or performed during the hospital encounter of 03/30/16  Ethanol  Result Value Ref Range   Alcohol, Ethyl (B) <5 <5 mg/dL  Protime-INR  Result Value Ref Range   Prothrombin Time 19.0 (H) 11.6 - 15.2 seconds   INR 1.59 (H) 0.00 - 1.49  APTT  Result Value Ref Range   aPTT 27 24 - 37 seconds  CBC  Result Value Ref Range   WBC 6.0 4.0 - 10.5 K/uL   RBC 4.44 3.87 - 5.11 MIL/uL   Hemoglobin 12.6 12.0 - 15.0 g/dL   HCT 40.6 36.0 - 46.0 %   MCV 91.4 78.0 - 100.0 fL   MCH 28.4 26.0 - 34.0 pg   MCHC 31.0 30.0 - 36.0 g/dL   RDW 15.4 11.5 - 15.5 %   Platelets 130 (L) 150 - 400 K/uL  Differential   Result Value Ref Range   Neutrophils Relative % 57 %   Neutro Abs 3.4 1.7 - 7.7 K/uL   Lymphocytes Relative 26 %   Lymphs Abs 1.6 0.7 - 4.0 K/uL   Monocytes Relative 12 %   Monocytes Absolute 0.7 0.1 - 1.0 K/uL   Eosinophils Relative 5 %   Eosinophils Absolute 0.3 0.0 - 0.7 K/uL   Basophils Relative 0 %   Basophils Absolute 0.0 0.0 - 0.1 K/uL  CBG monitoring, ED  Result Value Ref Range   Glucose-Capillary 104 (H) 65 - 99 mg/dL   Imaging Review Ct  Head Wo Contrast  03/30/2016  CLINICAL DATA:  Initial evaluation for acute left hemi paresis. EXAM: CT HEAD WITHOUT CONTRAST TECHNIQUE: Contiguous axial images were obtained from the base of the skull through the vertex without intravenous contrast. COMPARISON:  None. FINDINGS: Diffuse prominence of the CSF containing spaces is compatible with generalized age-related cerebral atrophy. Patchy hypodensity within the periventricular and deep white matter both cerebral hemispheres most compatible chronic small vessel ischemic disease. Prominent vascular calcifications within the carotid siphons and distal vertebral arteries. There is vague hypodensity involving the right basal ganglia, specifically involving the right caudate head and lentiform nuclei, concerning for evolving ischemia. Possible extension into the right subinsular region and/or insular cortex. No associated hemorrhage or significant mass effect. Question of asymmetric hyperdensity within the distal right M1 segment, which may reflect intraluminal thrombus (series 201, image 11). Deep gray nuclei otherwise maintained. No other definite evolving cortical ischemia. No left cerebral or cerebellar infarcts identified. Focal hypodensity within the central pons favored to be chronic in nature. No mass lesion, midline shift, or significant mass effect. No hydrocephalus. No extra-axial fluid collection. Scalp soft tissues within normal limits. No acute abnormality about the orbits. Paranasal sinuses  are clear.  No mastoid effusion. Calvarium intact. IMPRESSION: 1. Focal hypodensity within the right basal ganglia, highly suspicious for evolving ischemia. Possible extension into the right subinsular region and insular cortex may be present as well. No associated hemorrhage. 2. Asymmetric hyperdensity within the distal right M1 segment, which may reflect intraluminal thrombus versus atheromatous plaque. 3. Remote pontine infarct. 4. Generalized cerebral atrophy with chronic microvascular ischemic disease and intracranial atherosclerosis. Electronically Signed   By: Jeannine Boga M.D.   On: 03/30/2016 07:18   I have personally reviewed and evaluated these images and lab results as part of my medical decision-making.   EKG Interpretation DENI, CICCIO [id number] 30-Mar-2016 05:56:13 [facility]/ED ROUTINE RECORD Atrial Fibrillation Left bundle branch block 68mm/s 75mm/mV 100Hz  8.0 SP2 CID: [id number] Referred by: Unconfirmed Vent. rate 73 BPM PR interval 150 ms QRS duration 125 ms QT/QTc 461/508 ms P-R-T axes 118 12 92 27-Mar-1941 (75 yr) Female Caucasian Room:D35C Loc:11 Technician: [phone] Test ind:  When compared with ECG of 10/14/2015, no significant changes are seen   CRITICAL CARE Performed by: [id number] Total critical care time: 90 minutes Critical care time was exclusive of separately billable procedures and treating other patients. Critical care was necessary to treat or prevent imminent or life-threatening deterioration. Critical care was time spent personally by me on the following activities: development of treatment plan with patient and/or surrogate as well as nursing, discussions with consultants, evaluation of patient's response to treatment, examination of patient, obtaining history from patient or surrogate, ordering and performing treatments and interventions, ordering and review of laboratory studies, ordering and review of radiographic studies,  pulse oximetry and re-evaluation of patient's condition. MDM   Final diagnoses:  Cerebrovascular accident (CVA) due to embolism of right middle cerebral artery ([place])  Subtherapeutic international normalized ratio (INR)  Chronic atrial fibrillation ([place])    Apparent right hemisphere stroke with left hemi-para cyst. She was last seen normal 9 hours ago which places her outside of the code stroke window and outside of the window for thrombolytic therapy. She is being sent for CT of head and laboratory workup initiated. Old records are reviewed and she is followed in cardiology clinic for persistent atrial fibrillation and ischemic cardiomyopathy. Dr.Shikhman from neurology service has been consulted.   CT head is suggestive of  thrombus at the right middle cerebral artery. I discussed the findings with Dr. Nevada Crane of radiology who recommends proceeding with CT angiogram of head and neck. At this point, I do not see any notes from neurology. I have discussed the case with Dr. Silverio Decamp, who has come on for the day shift for neurology and he is coming to evaluate the patient to help make decision whether she is a candidate for interventional radiology.  Delora Fuel, MD [id number]

## 2016-03-30 NOTE — Evaluation (Signed)
Speech Language Pathology Evaluation Patient Details Name: [name] MRN: [id number] DOB: [birth year] Today's Date: 03/30/2016 Time: 1345-1405 SLP Time Calculation (min) (ACUTE ONLY): 20 min  Problem List:  Patient Active Problem List   Diagnosis Date Noted  . CVA (cerebral vascular accident) (HCC) 03/30/2016  . CKD (chronic kidney disease) stage 3, GFR 30-59 ml/min 03/30/2016  . Chronic anticoagulation 03/30/2016  . Diabetes (HCC) 01/31/2009  . Hyperlipidemia 01/31/2009  . ANXIETY 01/31/2009  . Coronary atherosclerosis 01/31/2009  . CAD, AUTOLOGOUS BYPASS GRAFT 01/31/2009  . CARDIOMYOPATHY, ISCHEMIC 01/31/2009  . ATRIAL FIBRILLATION 01/31/2009  . SYSTOLIC HEART FAILURE, CHRONIC 01/31/2009  . TACHYCARDIA, HX OF 01/31/2009   Past Medical History:  Past Medical History  Diagnosis Date  . CAD (coronary artery disease) of artery bypass graft 05/2008    S/P CABG in 2009  . Ischemic cardiomyopathy   . Systolic heart failure   . Atrial fibrillation ([facility])     Postoperative in 2009;  S/P transesophageal echocardiography-guided cardioversion, previously on Amiodarone and Coumadin; recurrent in April 2013  . Diabetes mellitus   . Hyperlipidemia   . Chronic anticoagulation   . Obesities, morbid [facility])    Past Surgical History:  Past Surgical History  Procedure Laterality Date  . Ventral hernia repair  10/1999    With mesh  . Total knee arthroplasty  1994    Right  . Appendectomy      At age 59   HPI:  75 year old female admitted 03/30/16 with left facial weakness and dysarthria. PMH significant for CAD, AFib, HLD, DM.    Assessment / Plan / Recommendation Clinical Impression  The Montreal Cognitive Assessment was administered. Pt scored 26/30, which is within normal limits for this evaluation. Points were lost on the following subtests: executive function, delayed recall, visuoperception, and attention. Minimal left facial weakness is noted, however, speech is fully  intellgible. No further ST intervention is recommended at this time. Please reconsult if needs arise.    SLP Assessment  Patient does not need any further Speech Lanaguage Pathology Services       SLP Evaluation Prior Functioning  Cognitive/Linguistic Baseline: Within functional limits Type of Home: House  Lives With: Son Education: high school grad Vocation: Unemployed   Cognition  Overall Cognitive Status: Within Functional Limits for tasks assessed Comments: 26/30 on MoCA    Comprehension  Auditory Comprehension Overall Auditory Comprehension: Appears within functional limits for tasks assessed    Expression Verbal Expression Overall Verbal Expression: Appears within functional limits for tasks assessed   Oral / Motor  Oral Motor/Sensory Function Overall Oral Motor/Sensory Function: Mild impairment Facial ROM: Reduced left Facial Symmetry: Abnormal symmetry left Facial Strength: Within Functional Limits Facial Sensation: Within Functional Limits Lingual ROM: Within Functional Limits Lingual Symmetry: Within Functional Limits Lingual Strength: Within Functional Limits Lingual Sensation: Within Functional Limits Velum: Within Functional Limits Mandible: Within Functional Limits Motor Speech Overall Motor Speech: Appears within functional limits for tasks assessed           Functional Assessment Tool Used: asha noms, clinical judgment, MoCA Functional Limitations: Memory Memory Current Status (W0981): At least 1 percent but less than 20 percent impaired, limited or restricted Memory Goal Status (X9147): At least 1 percent but less than 20 percent impaired, limited or restricted Memory Discharge Status [phone]): At least 1 percent but less than 20 percent impaired, limited or restricted         Leigh Aurora 03/30/2016, 2:16 PM  Mervin Ramires B. Beva Remund, MSP,  CCC-SLP [phone]

## 2016-03-30 NOTE — ED Notes (Signed)
Patient transported to CT 

## 2016-03-30 NOTE — ED Notes (Signed)
Admitting at bedside 

## 2016-03-30 NOTE — ED Notes (Signed)
Pt incontinent of urine-unable to obtain urine sample at this time.  To CT.

## 2016-03-30 NOTE — Discharge Planning (Signed)
ED Case Management Note  Subjective/Objective:   an 75 y.o. female patient who was brought into the emergency room by her son after noticing left facial weakness early this morning around 4:30 when he woke up. Patient was last seen normal at 9 PM and she went to bed last night. Patient is not aware of any facial weakness or any symptoms, her son found her this morning having a slurred speech and the facial weakness. She has a history of chronic atrial fibrillation. / From home alone.   Expected Discharge Date:          04/01/16       Action/Plan:  Follow for disposition needs. / Admit to hospital.

## 2016-03-30 NOTE — ED Notes (Signed)
IV team at bedside 

## 2016-03-30 NOTE — Progress Notes (Signed)
Reviewed MRI brain which shows several ischemic infarct in the right insula, basal ganglia, and caudate.  There is some hemorrhagic conversion in the latter infarcts with some mild mass effect on the right lateral ventricle.  No hydrocephalus or intraventricular blood.  She has atrial fibrillation and was on coumadin with INR 1.59 on admission.    BP 148/55 P 72   Awake , alert, fully oriented.  Denies headaches, neck pain, nausea.  No language deficits.  There is left lower facial droop, left pronator drift, and left 4/5 weakness proximal and distal.    A/P:  Several right hemisphere cardioembolic infarcts secondary to subtherapeutic INR in a. Fib.  There has been some hemorrhagic conversion, but no significant midline shift and patient is asymptomatic.  BP is well controlled.  We will continue to observe and allow blood to resorb naturally.  Expect to resume anticoagulation in a week or so once the blood has resorbed more and she remains stable.

## 2016-03-30 NOTE — Progress Notes (Signed)
  Echocardiogram 2D Echocardiogram has been performed.  Aggie Cosier 03/30/2016, 4:12 PM

## 2016-03-30 NOTE — Consult Note (Addendum)
Requesting Physician: Dr.  Roxanne Mins    Reason for consultation:  Evaluate for acute stroke  HPI:                                                                                                                                         [name] is an 75 y.o. female patient who  was brought into the emergency room by her son after noticing left facial weakness early this morning around 4:30 when he woke up. Patient was last seen normal at 9 PM and she went to bed last night. Patient is not aware of any facial weakness or any symptoms, her son found her this morning having a slurred speech and the facial weakness. She has a history of chronic atrial fibrillation, on Coumadin. Last week her INR apparently was 1.6. Patient denies any new vision symptoms, no weakness or numbness in the right upper and lower extremities has mild weakness in the left upper and lower extremity is denies any numbness.  Date last known well:  2017, May 18th ( yesterday night)  Time last known well:  At 2100 tPA Given: No: Outside time window  Stroke Risk Factors - atrial fibrillation  Past Medical History: Past Medical History  Diagnosis Date  . CAD (coronary artery disease) of artery bypass graft 05/2008    S/P CABG in 2009  . Ischemic cardiomyopathy   . Systolic heart failure   . Atrial fibrillation ([place])     Postoperative in 2009;  S/P transesophageal echocardiography-guided cardioversion, previously on Amiodarone and Coumadin; recurrent in April 2013  . Diabetes mellitus   . Hyperlipidemia   . Chronic anticoagulation   . Obesities, morbid [facility])     Past Surgical History  Procedure Laterality Date  . Ventral hernia repair  10/1999    With mesh  . Total knee arthroplasty  1994    Right  . Appendectomy      At age 13    Family History: Family History  Problem Relation Age of Onset  . Clotting disorder Mother     Cerebral hemorrhage  . Emphysema Father     COD  . Lung cancer Brother   . Heart  disease Neg Hx     Social History:   reports that she has never smoked. She does not have any smokeless tobacco history on file. She reports that she does not drink alcohol or use illicit drugs.  Allergies:  Allergies  Allergen Reactions  . Benazepril Other (See Comments)  . Fe-Succ-C-Thre-B12-Des Stomach Other (See Comments)    Pt doesn't remember   . Spironolactone Other (See Comments)    Dizziness   . Sulfamethoxazole-Trimethoprim Other (See Comments)    Pt doesn't remember     Medications:  Current facility-administered medications:  .  0.9 %  sodium chloride infusion, , Intravenous, Continuous, Pritesh Sobecki Fuller Mandril, MD, Last Rate: 75 mL/hr at 03/30/16 [id number] .  aspirin suppository 300 mg, 300 mg, Rectal, Once, Bijon Mineer Fuller Mandril, MD .  insulin aspart (novoLOG) injection 0-9 Units, 0-9 Units, Subcutaneous, TID WC, Francesca Oman, DO .  insulin glargine (LANTUS) injection 20 Units, 20 Units, Subcutaneous, QHS, Francesca Oman, DO  Current outpatient prescriptions:  .  aspirin EC 81 MG tablet, Take 81 mg by mouth daily., Disp: , Rfl:  .  carvedilol (COREG) 6.25 MG tablet, Take 1 tablet (6.25 mg total) by mouth 2 (two) times daily with a meal., Disp: 60 tablet, Rfl: 6 .  cholecalciferol (VITAMIN D) 1000 UNITS tablet, Take 1,000 Units by mouth 2 (two) times daily., Disp: , Rfl:  .  docusate sodium (COLACE) 100 MG capsule, Take 100 mg by mouth 2 (two) times daily. , Disp: , Rfl:  .  furosemide (LASIX) 40 MG tablet, Take 60 mg by mouth daily. , Disp: , Rfl:  .  gabapentin (NEURONTIN) 400 MG capsule, Take 400 mg by mouth at bedtime., Disp: , Rfl:  .  insulin glargine (LANTUS) 100 UNIT/ML injection, Inject 32 Units into the skin at bedtime. , Disp: , Rfl:  .  losartan (COZAAR) 25 MG tablet, Take 25 mg by mouth daily.  , Disp: , Rfl:  .  nitroGLYCERIN  (NITROSTAT) 0.4 MG SL tablet, Place 0.4 mg under the tongue every 5 (five) minutes as needed. May repeat for up to 3 doses. , Disp: , Rfl:  .  potassium chloride SA (K-DUR,KLOR-CON) 20 MEQ tablet, Take 20 mEq by mouth daily.  , Disp: , Rfl:  .  rOPINIRole (REQUIP) 0.5 MG tablet, Take 1 mg by mouth at bedtime., Disp: , Rfl:  .  simvastatin (ZOCOR) 40 MG tablet, Take 40 mg by mouth at bedtime.  , Disp: , Rfl:  .  traMADol (ULTRAM) 50 MG tablet, Take 50-100 mg by mouth 2 (two) times daily. Take 2 tablets every morning and take 1 tablet at night, Disp: , Rfl:  .  vitamin B-12 (CYANOCOBALAMIN) 1000 MCG tablet, Take 1,000 mcg by mouth daily., Disp: , Rfl:  .  warfarin (COUMADIN) 1 MG tablet, Take 1 mg by mouth daily., Disp: , Rfl:  .  warfarin (COUMADIN) 5 MG tablet, Take 5 mg by mouth daily. As prescribed by coumadin clinic, Disp: , Rfl:    ROS:                                                                                                                                       History obtained from the patient  General ROS: negative for - chills, fatigue, fever, night sweats, weight gain or weight loss Psychological ROS: negative for - behavioral disorder, hallucinations, memory difficulties, mood swings or suicidal ideation Ophthalmic ROS: negative for -  blurry vision, double vision, eye pain or loss of vision ENT ROS: negative for - epistaxis, nasal discharge, oral lesions, sore throat, tinnitus or vertigo Allergy and Immunology ROS: negative for - hives or itchy/watery eyes Hematological and Lymphatic ROS: negative for - bleeding problems, bruising or swollen lymph nodes Endocrine ROS: negative for - galactorrhea, hair pattern changes, polydipsia/polyuria or temperature intolerance Respiratory ROS: negative for - cough, hemoptysis, shortness of breath or wheezing Cardiovascular ROS: negative for - chest pain, dyspnea on exertion, edema or irregular heartbeat Gastrointestinal ROS: negative for -  abdominal pain, diarrhea, hematemesis, nausea/vomiting or stool incontinence Genito-Urinary ROS: negative for - dysuria, hematuria, incontinence or urinary frequency/urgency Musculoskeletal ROS: negative for - joint swelling or muscular weakness Neurological ROS: as noted in HPI Dermatological ROS: negative for rash and skin lesion changes  Neurologic Examination:                                                                                                    Today's Vitals   03/30/16 0637 03/30/16 0720 03/30/16 0730 03/30/16 0800  BP:  142/108 137/57 144/69  Pulse:  64 57 60  Temp: 97.6 F (36.4 C)     TempSrc:      Resp:  19 12 12   Height:      Weight:      SpO2:  99% 100% 100%    Evaluation of higher integrative functions including: Level of alertness: Alert,  Oriented to time, place and person Speech: Moderate dysarthria, no evidence of aphasia noted.  Test the following cranial nerves: 2-12 grossly intact, dense left UMN lower facial weakness Motor examination: Normal tone, left upper extremity drift, mild left deltoid 4/5 weakness, distally. Strength, minimal left hip flexor weakness, distally full-strength, full 5/5 motor strength in right upper and lower extremities Examination of sensation : Reports symmetric sensation to pinprick in all 4 extremities and on face Examination of deep tendon reflexes: 2+, symmetric in all extremities, normal plantars bilaterally Test coordination: Normal finger nose testing, with no evidence of limb appendicular ataxia or abnormal involuntary movements or tremors noted.  Gait: Deferred   Lab Results: Basic Metabolic Panel: No results for input(s): NA, K, CL, CO2, GLUCOSE, BUN, CREATININE, CALCIUM, MG, PHOS in the last 168 hours.  Liver Function Tests: No results for input(s): AST, ALT, ALKPHOS, BILITOT, PROT, ALBUMIN in the last 168 hours. No results for input(s): LIPASE, AMYLASE in the last 168 hours. No results for input(s): AMMONIA  in the last 168 hours.  CBC:  Recent Labs Lab 03/30/16 0605  WBC 6.0  NEUTROABS 3.4  HGB 12.6  HCT 40.6  MCV 91.4  PLT 130*    Cardiac Enzymes: No results for input(s): CKTOTAL, CKMB, CKMBINDEX, TROPONINI in the last 168 hours.  Lipid Panel: No results for input(s): CHOL, TRIG, HDL, CHOLHDL, VLDL, LDLCALC in the last 168 hours.  CBG:  Recent Labs Lab 03/30/16 0601  GLUCAP 104*    Microbiology: Results for orders placed or performed during the hospital encounter of 05/12/08  Clostridium difficile EIA     Status: None   Collection  Time: 05/16/08 11:25 PM  Result Value Ref Range Status   Specimen Description STOOL  Final   Special Requests IMMUNE:NORM  Final   C difficile Toxins A+B, EIA NEGATIVE  Final   Report Status 05/18/2008 FINAL  Final  Urine culture     Status: None   Collection Time: 05/17/08  8:35 AM  Result Value Ref Range Status   Specimen Description URINE, CATHETERIZED  Final   Special Requests NONE  Final   Colony Count >=100,000 COLONIES/ML  Final   Culture ESCHERICHIA COLI  Final   Report Status 05/18/2008 FINAL  Final   Organism ID, Bacteria ESCHERICHIA COLI  Final      Susceptibility   Escherichia coli - MIC    AMPICILLIN <=2 Sensitive     CEFAZOLIN <=4 Sensitive     CEFTRIAXONE <=1 Sensitive     CIPROFLOXACIN <=0.25 Sensitive     GENTAMICIN <=1 Sensitive     LEVOFLOXACIN <=0.12 Sensitive     NITROFURANTOIN <=16 Sensitive     TOBRAMYCIN <=1 Sensitive     TRIMETH/SULFA <=20 Sensitive   Clostridium difficile EIA     Status: None   Collection Time: 05/18/08 10:30 AM  Result Value Ref Range Status   Specimen Description STOOL  Final   Special Requests PT ON VANC  Final   C difficile Toxins A+B, EIA NEGATIVE  Final   Report Status 05/19/2008 FINAL  Final  Clostridium difficile EIA     Status: None   Collection Time: 05/19/08  7:35 AM  Result Value Ref Range Status   Specimen Description STOOL  Final   Special Requests NONE  Final   C  difficile Toxins A+B, EIA NEGATIVE  Final   Report Status 05/20/2008 FINAL  Final  Clostridium difficile EIA     Status: None   Collection Time: 05/19/08  9:28 PM  Result Value Ref Range Status   Specimen Description STOOL  Final   Special Requests IMMUNE:NORM  Final   C difficile Toxins A+B, EIA NEGATIVE  Final   Report Status 05/21/2008 FINAL  Final  Culture, sputum-assessment     Status: None   Collection Time: 05/21/08  9:01 AM  Result Value Ref Range Status   Specimen Description SPUTUM  Final   Special Requests PATIENT ON FOLLOWING CIPRO  Final   Sputum evaluation   Final    THIS SPECIMEN IS ACCEPTABLE. RESPIRATORY CULTURE REPORT TO FOLLOW.   Report Status 05/21/2008 FINAL  Final  Culture, respiratory     Status: None   Collection Time: 05/21/08  9:01 AM  Result Value Ref Range Status   Specimen Description SPUTUM  Final   Special Requests PT ON CIPRO  Final   Gram Stain   Final    NO WBC SEEN RARE SQUAMOUS EPITHELIAL CELLS PRESENT RARE GRAM POSITIVE COCCI IN PAIRS   Culture NORMAL OROPHARYNGEAL FLORA  Final   Report Status 05/23/2008 FINAL  Final  Urine culture     Status: None   Collection Time: 05/25/08 12:51 PM  Result Value Ref Range Status   Specimen Description URINE, RANDOM  Final   Special Requests NONE  Final   Colony Count >=100,000 COLONIES/ML  Final   Culture   Final    Multiple bacterial morphotypes present, none predominant. Suggest appropriate recollection if clinically indicated.   Report Status 05/27/2008 FINAL  Final  Urine culture     Status: None   Collection Time: 05/31/08  9:47 AM  Result Value Ref Range Status  Specimen Description URINE, CATHETERIZED  Final   Special Requests PATIENT ON FOLLOWING CIPRO  Final   Colony Count NO GROWTH  Final   Culture NO GROWTH  Final   Report Status 06/02/2008 FINAL  Final  Urine culture     Status: None   Collection Time: 06/01/08 11:00 AM  Result Value Ref Range Status   Specimen Description URINE,  CLEAN CATCH  Final   Special Requests NONE  Final   Colony Count 50,000 COLONIES/ML  Final   Culture   Final    Multiple bacterial morphotypes present, none predominant. Suggest appropriate recollection if clinically indicated.   Report Status 06/03/2008 FINAL  Final     Imaging: Ct Head Wo Contrast  03/30/2016  ADDENDUM REPORT: 03/30/2016 08:06 ADDENDUM: Study discussed by telephone with Dr. Delora Fuel on [id number] at 0755 hours. We discussed additional workup for presumed emergent right MCA occlusion as the patient presents with left hemiparesis, but unknown time of ictus. There are early changes of right MCA territory infarct. ASPECTS score = 7 (changes in the caudate, lentiform, and insula). [place] Stroke Program Early CT Score Normal score = 10 Electronically Signed   By: Genevie Ann M.D.   On: 03/30/2016 08:06  03/30/2016  CLINICAL DATA:  Initial evaluation for acute left hemi paresis. EXAM: CT HEAD WITHOUT CONTRAST TECHNIQUE: Contiguous axial images were obtained from the base of the skull through the vertex without intravenous contrast. COMPARISON:  None. FINDINGS: Diffuse prominence of the CSF containing spaces is compatible with generalized age-related cerebral atrophy. Patchy hypodensity within the periventricular and deep white matter both cerebral hemispheres most compatible chronic small vessel ischemic disease. Prominent vascular calcifications within the carotid siphons and distal vertebral arteries. There is vague hypodensity involving the right basal ganglia, specifically involving the right caudate head and lentiform nuclei, concerning for evolving ischemia. Possible extension into the right subinsular region and/or insular cortex. No associated hemorrhage or significant mass effect. Question of asymmetric hyperdensity within the distal right M1 segment, which may reflect intraluminal thrombus (series 201, image 11). Deep gray nuclei otherwise maintained. No other definite evolving  cortical ischemia. No left cerebral or cerebellar infarcts identified. Focal hypodensity within the central pons favored to be chronic in nature. No mass lesion, midline shift, or significant mass effect. No hydrocephalus. No extra-axial fluid collection. Scalp soft tissues within normal limits. No acute abnormality about the orbits. Paranasal sinuses are clear.  No mastoid effusion. Calvarium intact. IMPRESSION: 1. Focal hypodensity within the right basal ganglia, highly suspicious for evolving ischemia. Possible extension into the right subinsular region and insular cortex may be present as well. No associated hemorrhage. 2. Asymmetric hyperdensity within the distal right M1 segment, which may reflect intraluminal thrombus versus atheromatous plaque. 3. Remote pontine infarct. 4. Generalized cerebral atrophy with chronic microvascular ischemic disease and intracranial atherosclerosis. Electronically Signed: By: Jeannine Boga M.D. On: 03/30/2016 07:18     Stroke Scales   NIHSS :   5 for left facial, mild left arm and left leg weakness, and moderate dysarthria  Pre stroke Modified Rankin Scale (mRS):  0  Assessment and plan:   LESHAY DOORN is an 75 y.o. female patient who was brought into the emergency room by her son after noticing left facial weakness early this morning around 4:30 when he woke up. Patient was last seen normal at 9 PM and she went to bed last night. Patient is not aware of any facial weakness or any symptoms, her son found her  this morning having a slurred speech and the facial weakness. She has a history of chronic atrial fibrillation, on Coumadin. Last week her INR apparently was 1.6. Patient denies any new vision symptoms, no weakness or numbness in the right upper and lower extremities has mild weakness in the left upper and lower extremity is denies any numbness. On my evaluation, she has moderate dysarthria with left facial weakness and mild left proximal upper  extremity weakness and minimal left lower extremity proximal weakness. NIHSS 5.  Not a candidate for IV TPA since last known normal was outside the TPA window. Also outside the time window for IR therapy.  For further stroke workup will obtain a CT angiogram of the head and neck and also CT perfusion study to evaluate for any significant penumbra at this time.  MRI of the brain after admission to evaluate size of the infarct.  We will be admitted to hospitalist service. She is on Coumadin with subtherapeutic INR 1.6. One dose aspirin suppository 300 mg to be given in the ER and started on IV fluids at 75 mL an hour normal saline to prevent hypotension. Her systolic blood pressures are in 140's. Recommend adjusting the Coumadin dose to maintain goal therapeutic INR between 2.0-2.5. If it is difficult to maintain a goal INR, consider switching to NOAC's.  Further stroke workup with an echocardiogram, continue telemetry monitoring, check A1c, lipid profile. Physical, occupational and speech pathology follow-up.   Discussed the current neurological assessment with the patient and his son at bedside, answered several of their questions .  Neurology stroke team will continue to follow starting tomorrow morning. please call for any further questions

## 2016-03-30 NOTE — ED Notes (Signed)
Per CT pt must have 18g in [facility] prior to study.  Pt attempting to urinate at this time.

## 2016-03-30 NOTE — ED Notes (Signed)
Pt presents to ER from home with [facility] for stroke like symptoms that were discovered by her son this morning at 53; son reports patient went to bed at 9pm which she appeared normal; son woke to find patient with LEFT sided facial droop, LUE & LLE weakness, and "not acting herself"; EMS reports CBG 136 enroute; EMS reports pt is CAOx4

## 2016-03-30 NOTE — H&P (Signed)
Date: 03/30/2016               Patient Name:  [name] MRN: [id number]  DOB: Sep 21, 1941 Age / Sex: 75 y.o., female   PCP: Daphene Jaeger, PA-C         Medical Service: Internal Medicine Teaching Service         Attending Physician: Dr. Lalla Brothers    First Contact: Dr. Maryellen Pile Pager: [phone]  Second Contact: Dr. Dellia Nims Pager: [phone]       After Hours (After 5p/  First Contact Pager: [phone]  weekends / holidays): Second Contact Pager: [phone]   Chief Complaint: left sided weakness and dysarthria  History of Present Illness: [name] is a 75 year old woman with PMH of CAD, chronic systolic HF, Afib on coumadin, HLD and DM here with c/o left sided weakness and left facial droop since this AM.  She lives with her son who witnessed the event; he is present and helps provide history.  Was in usual state of health yesterday.  Last seen normal around 8:30PM last night.  When son came into living room around 4:30AM today she was seated at the computer but back was to the computer.  The left side of her face was drooping, speech was slurred and she was unable to get up from the chair when she tried.  The patient says she did not feel any differently.  She does report B/L tingling or her hands in the ED but she says she has experienced the same sensation in the past.  She denies palpitations, lightheadedness, dizziness, numbness/tingling or vision changes during the event.    She denies hx of CVA, tobacco/EtOH/illicit drug use.  She usually ambulates with a walker and her son has not noticed any unilateral weakness.  She reports compliance with her medications, including coumadin.  She gets her INR checked by PCP and says it is usually 2-3 but this past Tuesday the INR was below 2.    Meds: Current Facility-Administered Medications  Medication Dose Route Frequency Provider Last Rate Last Dose  . 0.9 %  sodium chloride infusion   Intravenous Continuous Ram Fuller Mandril, MD 75 mL/hr at 03/30/16 [phone]    . insulin aspart (novoLOG) injection 0-9 Units  0-9 Units Subcutaneous TID WC Francesca Oman, DO   0 Units at 03/30/16 0909  . insulin glargine (LANTUS) injection 20 Units  20 Units Subcutaneous QHS Francesca Oman, DO       Current Outpatient Prescriptions  Medication Sig Dispense Refill  . aspirin EC 81 MG tablet Take 81 mg by mouth daily.    . carvedilol (COREG) 6.25 MG tablet Take 1 tablet (6.25 mg total) by mouth 2 (two) times daily with a meal. 60 tablet 6  . cholecalciferol (VITAMIN D) 1000 UNITS tablet Take 1,000 Units by mouth 2 (two) times daily.    Marland Kitchen docusate sodium (COLACE) 100 MG capsule Take 100 mg by mouth 2 (two) times daily.     . furosemide (LASIX) 40 MG tablet Take 60 mg by mouth daily.     Marland Kitchen gabapentin (NEURONTIN) 400 MG capsule Take 400 mg by mouth at bedtime.    . insulin glargine (LANTUS) 100 UNIT/ML injection Inject 32 Units into the skin at bedtime.     Marland Kitchen losartan (COZAAR) 25 MG tablet Take 25 mg by mouth daily.      . nitroGLYCERIN (NITROSTAT) 0.4 MG SL tablet Place 0.4 mg under the  tongue every 5 (five) minutes as needed. May repeat for up to 3 doses.     . potassium chloride SA (K-DUR,KLOR-CON) 20 MEQ tablet Take 20 mEq by mouth daily.      Marland Kitchen rOPINIRole (REQUIP) 0.5 MG tablet Take 1 mg by mouth at bedtime.    . simvastatin (ZOCOR) 40 MG tablet Take 40 mg by mouth at bedtime.      . traMADol (ULTRAM) 50 MG tablet Take 50-100 mg by mouth 2 (two) times daily. Take 2 tablets every morning and take 1 tablet at night    . vitamin B-12 (CYANOCOBALAMIN) 1000 MCG tablet Take 1,000 mcg by mouth daily.    Marland Kitchen warfarin (COUMADIN) 1 MG tablet Take 1 mg by mouth daily.    Marland Kitchen warfarin (COUMADIN) 5 MG tablet Take 5 mg by mouth daily. As prescribed by coumadin clinic      Allergies: Allergies as of 03/30/2016 - Review Complete 03/30/2016  Allergen Reaction Noted  . Benazepril Other (See Comments) 03/30/2016  . Fe-succ-c-thre-b12-des stomach  Other (See Comments) 05/04/2009  . Spironolactone Other (See Comments) 05/04/2009  . Sulfamethoxazole-trimethoprim Other (See Comments) 05/04/2009   Past Medical History  Diagnosis Date  . CAD (coronary artery disease) of artery bypass graft 05/2008    S/P CABG in 2009  . Ischemic cardiomyopathy   . Systolic heart failure   . Atrial fibrillation ([place])     Postoperative in 2009;  S/P transesophageal echocardiography-guided cardioversion, previously on Amiodarone and Coumadin; recurrent in April 2013  . Diabetes mellitus   . Hyperlipidemia   . Chronic anticoagulation   . Obesities, morbid [facility])    Past Surgical History  Procedure Laterality Date  . Ventral hernia repair  10/1999    With mesh  . Total knee arthroplasty  1994    Right  . Appendectomy      At age 75   Family History  Problem Relation Age of Onset  . Clotting disorder Mother     Cerebral hemorrhage  . Emphysema Father     COD  . Lung cancer Brother   . Heart disease Neg Hx    Social History   Social History  . Marital Status: Married    Spouse Name: N/A  . Number of Children: N/A  . Years of Education: N/A   Occupational History  . Not on file.   Social History Main Topics  . Smoking status: Never Smoker   . Smokeless tobacco: Not on file  . Alcohol Use: No  . Drug Use: No  . Sexual Activity: No   Other Topics Concern  . Not on file   Social History Narrative   Widowed   Lives alone in [place]   2 children   Not routinely exercising    Review of Systems: General:  Denies fever/chills, fatigue or unexplained weight loss HEENT:  Denies headache, change in vision/hearing or dysphagia Heart:  + palpitations when she goes to sleep at night; denies chest pain or increased LE edema; has stable orthopnea and occasional DOE Lungs:  Denies cough, increased sputum or dyspnea GI:  Denies abdominal pain, N/V or diarrhea GU:  Denies difficulty urinating, dysuria or hematuria Neuro:  Per  HPI  Physical Exam: Blood pressure 142/108, pulse 64, temperature 97.6 F (36.4 C), temperature source Oral, resp. rate 19, height 5\' 8"  (1.727 m), weight 300 lb (136.079 kg), SpO2 99 %. General: resting in bed in NAD HEENT: PERRL, EOMI, no scleral icterus, + left lower facial droop Cardiac:  irregular distant HS, no rubs, murmurs or gallops Pulm: clear to auscultation bilaterally, no w/r/r, moving normal volumes of air Abd: soft, obese, nontender, nondistended, BS present Ext: warm and well perfused, 1+ B/L pedal edema, chronic venous skin changes Neuro: alert and oriented X3, cranial nerves II-XII grossly intact except for CN VII as above, 5/5 MMS RUE and RLE, 4+/5 LUE and LLE, sensation grossly equal and intact, normal Babinski  Lab results: Basic Metabolic Panel:  Recent Labs  03/30/16 0740  NA 141  K 4.0  CL 103  CO2 27  GLUCOSE 101*  BUN 28*  CREATININE 1.32*  CALCIUM 9.1   Liver Function Tests:  Recent Labs  03/30/16 0740  AST 24  ALT 14  ALKPHOS 100  BILITOT 0.9  PROT 7.0  ALBUMIN 3.4*   CBC:  Recent Labs  03/30/16 0605  WBC 6.0  NEUTROABS 3.4  HGB 12.6  HCT 40.6  MCV 91.4  PLT 130*   Cardiac Enzymes:  Recent Labs  03/30/16 0740  TROPONINI <0.03   CBG:  Recent Labs  03/30/16 0601  GLUCAP 104*   Fasting Lipid Panel:  Recent Labs  03/30/16 0740  CHOL 102  HDL 39*  LDLCALC 53  TRIG 50  CHOLHDL 2.6   Coagulation:  Recent Labs  03/30/16 0605  LABPROT 19.0*  INR 1.59*   Urine Drug Screen: Drugs of Abuse  Pending   Alcohol Level:  Recent Labs  03/30/16 0605  ETH <5   Urinalysis: Pending  Imaging results:  Ct Head Wo Contrast  03/30/2016  CLINICAL DATA:  Initial evaluation for acute left hemi paresis. EXAM: CT HEAD WITHOUT CONTRAST TECHNIQUE: Contiguous axial images were obtained from the base of the skull through the vertex without intravenous contrast. COMPARISON:  None. FINDINGS: Diffuse prominence of the CSF  containing spaces is compatible with generalized age-related cerebral atrophy. Patchy hypodensity within the periventricular and deep white matter both cerebral hemispheres most compatible chronic small vessel ischemic disease. Prominent vascular calcifications within the carotid siphons and distal vertebral arteries. There is vague hypodensity involving the right basal ganglia, specifically involving the right caudate head and lentiform nuclei, concerning for evolving ischemia. Possible extension into the right subinsular region and/or insular cortex. No associated hemorrhage or significant mass effect. Question of asymmetric hyperdensity within the distal right M1 segment, which may reflect intraluminal thrombus (series 201, image 11). Deep gray nuclei otherwise maintained. No other definite evolving cortical ischemia. No left cerebral or cerebellar infarcts identified. Focal hypodensity within the central pons favored to be chronic in nature. No mass lesion, midline shift, or significant mass effect. No hydrocephalus. No extra-axial fluid collection. Scalp soft tissues within normal limits. No acute abnormality about the orbits. Paranasal sinuses are clear.  No mastoid effusion. Calvarium intact. IMPRESSION: 1. Focal hypodensity within the right basal ganglia, highly suspicious for evolving ischemia. Possible extension into the right subinsular region and insular cortex may be present as well. No associated hemorrhage. 2. Asymmetric hyperdensity within the distal right M1 segment, which may reflect intraluminal thrombus versus atheromatous plaque. 3. Remote pontine infarct. 4. Generalized cerebral atrophy with chronic microvascular ischemic disease and intracranial atherosclerosis. Electronically Signed   By: Jeannine Boga M.D.   On: 03/30/2016 07:18    Other results: EKG:  Irregular, 73bpm, QRS slightly prolonged at 125 and technically meets criteria for LBBB but does not look like usual LBBB, no ST  elevation, T wave flattening similar to prior EKG  Assessment & Plan by Problem: 75 year  old woman with PMH of CAD, chronic systolic HF, Afib on coumadin, HLD and DM.  CVA:  Right sided ischemia w/o evidence of hemorrhage.  There is left facial droop on exam with very mild left extremity weakness.  Neurology has been consulted and recommendations are appreciated.  She has multiple CVA risk factors including Afib with current subtherapeutic INR. - IV fluids to maintain BP - hold BP medications for now and allow permissive HTN in the setting of acute CVA - continue daily ASA - Neuro recs for CTA and MRA for further characterization; these studies are pending - plan to resume coumadin within the next 24 hours if no expansion on imaging - 2D ECHO, carotid dopplers to assess for other etiologies - telemetry monitoring - follow-up additional neuro recommendations  Peramanent fibrillation on coumadin:  Rate controlled on BB.  CHADSVASc score is 6 and she is on coumadin for A/C.  INR was subtherapeutic at PCP earlier this week and this admission is 1.59.   - hold BB as above - resume A/C after imaging if no changes; likely this evening  Diabetes:  Reports compliance with Lantus 32 units qHS.  Last A1c in EPIC was 7.3 in 2009.  There is not an A1c result in Care Everywhere but blood glucose is [facility] at [facility] last month and is 101 in the ED today so she appears to have good control. - continue Lantus but reduce dose to 20 units qHs for now since she will be in Carb mod diet during admission (after bedside swallow evaluation). - hgb A1c is pending  Hyperlipidemia:  She reports compliance with simvastatin 40mg .  Lipid panel this AM reveals LDL 53, HDL 39, TG 50, TChol 102.   - given current stroke, would recommend high intensity statin if tolerated (Crestor or Lipitor, whichever is covered by her insurer)  Coronary atherosclerosis:  Stable.  She has not needed to use SL NTG recently.  EKG technically meets  criteria for LBBB with broad R in I, aVL, V6 (mildly prolonged QRS ~ 125) but morphology otherwise similar to prior EKG and she is asymptomatic w/ negative troponin so I do not suspect underlying ischemia. - continue ASA - need statin increase prior to discharge - hold BB, ACEI as above - repeat EKG, continue telemetry  Chronic systolic heart failure:  Stable. EF 40-45% February 2014.  Her cardiologist is Dr. Angelena Form and she was last seen in Dec 2016.  She reports stable symptoms. She is compliant with her med regimen including BB and ARB.   - hold BB, ARB, Lasix as above  CKD3:   Stable.  Per Care Everywhere record, her baseline Cr appears to be 1.2-1.4.  She is at baseline currently.  Her nephrologist is [facility] Audie [facility] at [facility].    Diet:  NPO. Okay to start carb modified diet once she passes bedside swallow.  Will need SLP evaluation if she does not pass RN's bedside swallow. VTE ppx:  SCDs.  There is no evidence of hemorrhage on CT head.  Will await CTA and MRI and resume coumadin this evening if no evidence of expansion or hemorrhagic transformation. Code status:  Full  Dispo: Disposition is deferred at this time, awaiting improvement of current medical problems. Anticipated discharge in approximately 1-2 day(s).   The patient does have a current PCP ([place], PA-C) and does not need an [facility] hospital follow-up appointment after discharge.  The patient does not have transportation limitations that hinder transportation to clinic appointments.  Signed: Francesca Oman, DO 03/30/2016, 7:39 AM

## 2016-03-30 NOTE — ED Notes (Signed)
Attempted report 

## 2016-03-31 ENCOUNTER — Inpatient Hospital Stay (HOSPITAL_COMMUNITY): Payer: Medicare Other

## 2016-03-31 DIAGNOSIS — R531 Weakness: Secondary | ICD-10-CM | POA: Diagnosis present

## 2016-03-31 DIAGNOSIS — I63411 Cerebral infarction due to embolism of right middle cerebral artery: Secondary | ICD-10-CM | POA: Diagnosis not present

## 2016-03-31 DIAGNOSIS — E1142 Type 2 diabetes mellitus with diabetic polyneuropathy: Secondary | ICD-10-CM | POA: Diagnosis present

## 2016-03-31 DIAGNOSIS — I255 Ischemic cardiomyopathy: Secondary | ICD-10-CM | POA: Diagnosis present

## 2016-03-31 DIAGNOSIS — M545 Low back pain: Secondary | ICD-10-CM | POA: Diagnosis present

## 2016-03-31 DIAGNOSIS — N183 Chronic kidney disease, stage 3 (moderate): Secondary | ICD-10-CM | POA: Diagnosis not present

## 2016-03-31 DIAGNOSIS — I63511 Cerebral infarction due to unspecified occlusion or stenosis of right middle cerebral artery: Secondary | ICD-10-CM | POA: Diagnosis not present

## 2016-03-31 DIAGNOSIS — I25810 Atherosclerosis of coronary artery bypass graft(s) without angina pectoris: Secondary | ICD-10-CM | POA: Diagnosis present

## 2016-03-31 DIAGNOSIS — Z96651 Presence of right artificial knee joint: Secondary | ICD-10-CM | POA: Diagnosis present

## 2016-03-31 DIAGNOSIS — Z6841 Body Mass Index (BMI) 40.0 and over, adult: Secondary | ICD-10-CM | POA: Diagnosis not present

## 2016-03-31 DIAGNOSIS — I69354 Hemiplegia and hemiparesis following cerebral infarction affecting left non-dominant side: Secondary | ICD-10-CM | POA: Diagnosis not present

## 2016-03-31 DIAGNOSIS — G8190 Hemiplegia, unspecified affecting unspecified side: Secondary | ICD-10-CM | POA: Diagnosis not present

## 2016-03-31 DIAGNOSIS — I638 Other cerebral infarction: Secondary | ICD-10-CM | POA: Diagnosis not present

## 2016-03-31 DIAGNOSIS — I639 Cerebral infarction, unspecified: Secondary | ICD-10-CM | POA: Diagnosis present

## 2016-03-31 DIAGNOSIS — I6389 Other cerebral infarction: Secondary | ICD-10-CM | POA: Insufficient documentation

## 2016-03-31 DIAGNOSIS — I619 Nontraumatic intracerebral hemorrhage, unspecified: Secondary | ICD-10-CM | POA: Diagnosis present

## 2016-03-31 DIAGNOSIS — Z8673 Personal history of transient ischemic attack (TIA), and cerebral infarction without residual deficits: Secondary | ICD-10-CM | POA: Diagnosis not present

## 2016-03-31 DIAGNOSIS — R0682 Tachypnea, not elsewhere classified: Secondary | ICD-10-CM | POA: Diagnosis present

## 2016-03-31 DIAGNOSIS — Z7982 Long term (current) use of aspirin: Secondary | ICD-10-CM | POA: Diagnosis not present

## 2016-03-31 DIAGNOSIS — G8194 Hemiplegia, unspecified affecting left nondominant side: Secondary | ICD-10-CM | POA: Diagnosis present

## 2016-03-31 DIAGNOSIS — R202 Paresthesia of skin: Secondary | ICD-10-CM | POA: Diagnosis not present

## 2016-03-31 DIAGNOSIS — D696 Thrombocytopenia, unspecified: Secondary | ICD-10-CM | POA: Diagnosis present

## 2016-03-31 DIAGNOSIS — I481 Persistent atrial fibrillation: Secondary | ICD-10-CM | POA: Diagnosis not present

## 2016-03-31 DIAGNOSIS — I2510 Atherosclerotic heart disease of native coronary artery without angina pectoris: Secondary | ICD-10-CM

## 2016-03-31 DIAGNOSIS — R791 Abnormal coagulation profile: Secondary | ICD-10-CM | POA: Insufficient documentation

## 2016-03-31 DIAGNOSIS — B9689 Other specified bacterial agents as the cause of diseases classified elsewhere: Secondary | ICD-10-CM | POA: Diagnosis not present

## 2016-03-31 DIAGNOSIS — E785 Hyperlipidemia, unspecified: Secondary | ICD-10-CM | POA: Diagnosis present

## 2016-03-31 DIAGNOSIS — I5042 Chronic combined systolic (congestive) and diastolic (congestive) heart failure: Secondary | ICD-10-CM | POA: Diagnosis present

## 2016-03-31 DIAGNOSIS — Z7901 Long term (current) use of anticoagulants: Secondary | ICD-10-CM | POA: Diagnosis not present

## 2016-03-31 DIAGNOSIS — E1122 Type 2 diabetes mellitus with diabetic chronic kidney disease: Secondary | ICD-10-CM | POA: Diagnosis not present

## 2016-03-31 DIAGNOSIS — I482 Chronic atrial fibrillation: Secondary | ICD-10-CM

## 2016-03-31 DIAGNOSIS — N390 Urinary tract infection, site not specified: Secondary | ICD-10-CM | POA: Diagnosis not present

## 2016-03-31 DIAGNOSIS — I447 Left bundle-branch block, unspecified: Secondary | ICD-10-CM | POA: Diagnosis present

## 2016-03-31 DIAGNOSIS — I69392 Facial weakness following cerebral infarction: Secondary | ICD-10-CM | POA: Diagnosis not present

## 2016-03-31 DIAGNOSIS — R29810 Facial weakness: Secondary | ICD-10-CM | POA: Diagnosis present

## 2016-03-31 DIAGNOSIS — Z9841 Cataract extraction status, right eye: Secondary | ICD-10-CM | POA: Diagnosis not present

## 2016-03-31 DIAGNOSIS — G8929 Other chronic pain: Secondary | ICD-10-CM | POA: Diagnosis present

## 2016-03-31 DIAGNOSIS — I5022 Chronic systolic (congestive) heart failure: Secondary | ICD-10-CM

## 2016-03-31 DIAGNOSIS — Z955 Presence of coronary angioplasty implant and graft: Secondary | ICD-10-CM | POA: Diagnosis not present

## 2016-03-31 DIAGNOSIS — Z794 Long term (current) use of insulin: Secondary | ICD-10-CM | POA: Diagnosis not present

## 2016-03-31 DIAGNOSIS — R4781 Slurred speech: Secondary | ICD-10-CM | POA: Diagnosis present

## 2016-03-31 DIAGNOSIS — I130 Hypertensive heart and chronic kidney disease with heart failure and stage 1 through stage 4 chronic kidney disease, or unspecified chronic kidney disease: Secondary | ICD-10-CM | POA: Diagnosis present

## 2016-03-31 LAB — GLUCOSE, CAPILLARY
GLUCOSE-CAPILLARY: 244 mg/dL — AB (ref 65–99)
Glucose-Capillary: 81 mg/dL (ref 65–99)
Glucose-Capillary: 104 mg/dL — ABNORMAL HIGH (ref 65–99)
Glucose-Capillary: 173 mg/dL — ABNORMAL HIGH (ref 65–99)

## 2016-03-31 LAB — HEMOGLOBIN A1C
HEMOGLOBIN A1C: 8.1 % — AB (ref 4.8–5.6)
Mean Plasma Glucose: 186 mg/dL

## 2016-03-31 MED ORDER — TRAMADOL HCL 50 MG PO TABS
50.0000 mg | ORAL_TABLET | Freq: Every day | ORAL | Status: DC
  Administered 2016-03-31 – 2016-04-03 (×4): 50 mg via ORAL
  Filled 2016-03-31 (×4): qty 1

## 2016-03-31 MED ORDER — TRAMADOL HCL 50 MG PO TABS
100.0000 mg | ORAL_TABLET | Freq: Every day | ORAL | Status: DC
  Administered 2016-03-31 – 2016-04-04 (×5): 100 mg via ORAL
  Filled 2016-03-31 (×5): qty 2

## 2016-03-31 MED ORDER — ATORVASTATIN CALCIUM 40 MG PO TABS
40.0000 mg | ORAL_TABLET | Freq: Every day | ORAL | Status: DC
  Administered 2016-03-31 – 2016-04-03 (×4): 40 mg via ORAL
  Filled 2016-03-31 (×4): qty 1

## 2016-03-31 MED ORDER — INSULIN GLARGINE 100 UNIT/ML ~~LOC~~ SOLN
18.0000 [IU] | Freq: Every day | SUBCUTANEOUS | Status: DC
  Administered 2016-03-31 – 2016-04-03 (×4): 18 [IU] via SUBCUTANEOUS
  Filled 2016-03-31 (×5): qty 0.18

## 2016-03-31 NOTE — Progress Notes (Signed)
STROKE TEAM PROGRESS NOTE   HISTORY OF PRESENT ILLNESS (per record) [name] is an 75 y.o. female patient who was brought into the emergency room by her son after noticing left facial weakness early this morning around 4:30 when he woke up. Patient was last seen normal at 9 PM and she went to bed last night. Patient is not aware of any facial weakness or any symptoms, her son found her this morning having a slurred speech and the facial weakness. She has a history of chronic atrial fibrillation, on Coumadin. Last week her INR apparently was 1.6. Patient denies any new vision symptoms, no weakness or numbness in the right upper and lower extremities has mild weakness in the left upper and lower extremity is denies any numbness.  Date last known well: 2017, May 18th ( yesterday night)  Time last known well: At 2100 tPA Given: No: Outside time window   SUBJECTIVE (INTERVAL HISTORY) Her son, daughter and grandson is at the bedside.  Overall she feels her condition is rapidly improving. Her left facial droop and left sided weakness much improved. So far she mainly complains of back pain. She is able to walk with walker to bathroom did not notice any dragging of left leg.   OBJECTIVE Temp:  [97.8 F (36.6 C)-98.8 F (37.1 C)] 98.4 F (36.9 C) (05/20 0620) Pulse Rate:  [60-96] 96 (05/20 0620) Cardiac Rhythm:  [-] Atrial fibrillation (05/19 2146) Resp:  [12-18] 18 (05/20 0620) BP: (102-150)/(45-90) 121/45 mmHg (05/20 0620) SpO2:  [98 %-100 %] 100 % (05/20 0620)  CBC:  Recent Labs Lab 03/30/16 0605  WBC 6.0  NEUTROABS 3.4  HGB 12.6  HCT 40.6  MCV 91.4  PLT 130*    Basic Metabolic Panel:  Recent Labs Lab 03/30/16 0740  NA 141  K 4.0  CL 103  CO2 27  GLUCOSE 101*  BUN 28*  CREATININE 1.32*  CALCIUM 9.1    Lipid Panel:    Component Value Date/Time   CHOL 102 03/30/2016 0740   TRIG 50 03/30/2016 0740   HDL 39* 03/30/2016 0740   CHOLHDL 2.6 03/30/2016 0740   VLDL  10 03/30/2016 0740   LDLCALC 53 03/30/2016 0740   HgbA1c:  Lab Results  Component Value Date   HGBA1C 8.1* 03/30/2016   Urine Drug Screen:    Component Value Date/Time   LABOPIA NONE DETECTED 03/30/2016 1258   COCAINSCRNUR NONE DETECTED 03/30/2016 1258   LABBENZ NONE DETECTED 03/30/2016 1258   AMPHETMU NONE DETECTED 03/30/2016 1258   THCU NONE DETECTED 03/30/2016 1258   LABBARB NONE DETECTED 03/30/2016 1258      IMAGING  Ct Angio Head and neck W/cm &/or Wo Cm 03/30/2016   1. Right proximal M2 occlusion with distal reconstitution. There is completed infarct in the right caudate, putamen, internal capsule, and posterior insula.  2. No flow limiting stenosis or embolic source identified in the neck.   Ct Head Wo Contrast 03/30/2016   ADDENDUM REPORT: 03/30/2016 08:06 ADDENDUM: Study discussed by telephone with Dr. Delora Fuel on [id number] at 0755 hours. We discussed additional workup for presumed emergent right MCA occlusion as the patient presents with left hemiparesis, but unknown time of ictus. There are early changes of right MCA territory infarct. ASPECTS score = 7 (changes in the caudate, lentiform, and insula). [place] Stroke Program Early CT Score Normal score = 10 Electronically   03/30/2016   1. Focal hypodensity within the right basal ganglia, highly suspicious for evolving ischemia. Possible  extension into the right subinsular region and insular cortex may be present as well. No associated hemorrhage.  2. Asymmetric hyperdensity within the distal right M1 segment, which may reflect intraluminal thrombus versus atheromatous plaque.  3. Remote pontine infarct.  4. Generalized cerebral atrophy with chronic microvascular ischemic disease and intracranial atherosclerosis.   Ct Angio Head and Neck W/cm &/or Wo/cm 03/30/2016   1. Right proximal M2 occlusion with distal reconstitution. There is completed infarct in the right caudate, putamen, internal capsule, and posterior insula.   2. No flow limiting stenosis or embolic source identified in the neck.   Mr Brain Wo Contrast 03/30/2016   Hemorrhagic conversion is noted within the right basal ganglia infarcts since the earlier CT scan.   Edema and mass effect is noted with the petechial hemorrhage and infarct.  There is partial effacement of the right lateral ventricle. T2 changes are associated with the areas of acute infarct. Impression 1. Acute/subacute infarcts involve the right caudate head, lentiform nucleus, insular cortex, and portions of the right temporal lobe.  2. Moderate generalized atrophy and diffuse white matter disease likely reflects the sequela of chronic microvascular ischemia in addition to the acute infarcts.   Transthoracic echocardiogram 03/30/2016 Study Conclusions - Left ventricle: The cavity size was normal. Wall thickness was  normal. Systolic function was moderately reduced. The estimated  ejection fraction was in the range of 35% to 40%. Diffuse  hypokinesis. Doppler parameters are consistent with high  ventricular filling pressure. - Aortic valve: There was trivial regurgitation. - Mitral valve: Calcified annulus. There was mild regurgitation. - Left atrium: The atrium was mildly dilated. - Right atrium: The atrium was mildly dilated. - Tricuspid valve: There was moderate regurgitation. - Pulmonary arteries: Systolic pressure was moderately increased.  PA peak pressure: 49 mm Hg (S). Impressions: - Technically difficult; definity used; moderate global LV  dysfunction (EF 49); trace AI; mild MR; mild biatrial  enlargement; moderate TR; moderately elevated pulmonary pressure.   PHYSICAL EXAM  Temp:  [98 F (36.7 C)-99 F (37.2 C)] 99 F (37.2 C) (05/20 0923) Pulse Rate:  [66-96] 70 (05/20 0923) Resp:  [16-18] 16 (05/20 0923) BP: (102-150)/(45-90) 102/73 mmHg (05/20 0923) SpO2:  [97 %-100 %] 97 % (05/20 0923)  General - Well nourished, well developed, in no apparent  distress.  Ophthalmologic - Fundi not visualized due to small pupils.  Cardiovascular - irregularly irregular heart rate and rhythm.  Mental Status -  Level of arousal and orientation to time, place, and person were intact. Language including expression, naming, repetition, comprehension was assessed and found intact. Fund of Knowledge was assessed and was intact.  Cranial Nerves II - XII - II - Visual field intact OU. III, IV, VI - Extraocular movements intact. V - Facial sensation intact bilaterally. VII - mild left nasolabial fold flattening. VIII - Hearing & vestibular intact bilaterally. X - Palate elevates symmetrically. XI - Chin turning & shoulder shrug intact bilaterally. XII - Tongue protrusion intact.  Motor Strength - The patient's strength was in all extremities except left bicep and tricep 4+/5 as well as left LE proximal 3/5 due to back pain and pronator drift was absent.  Bulk was normal and fasciculations were absent.   Motor Tone - Muscle tone was assessed at the neck and appendages and was normal.  Reflexes - The patient's reflexes were 1+ in all extremities and she had no pathological reflexes.  Sensory - Light touch, temperature/pinprick were assessed and were symmetrical.  Coordination - The patient had normal movements in the hands with no ataxia or dysmetria.  Tremor was absent.  Gait and Station - deferred due to safety concerns.   ASSESSMENT/PLAN Ms. SHAVANNAH STEUER is a 75 y.o. female with history of coronary artery disease, ischemic cardiomyopathy, congestive heart failure, atrial fibrillation, Coumadin therapy, hyperlipidemia, obesity, hypertension, diabetes mellitus, previous TIA, and chronic kidney disease presenting with left facial weakness, slurred speech, and subtherapeutic INR.  She did not receive IV t-PA due to late presentation.  Stroke: right MCA infarcts with hemorrhagic transformation felt to be embolic secondary to atrial fibrillation  with a subtherapeutic INR.  Resultant  Subtle left sided weakness  MRI - Acute/subacute infarcts involve the right caudate head, lentiform nucleus, insular cortex, and portions of the right temporal lobe. Hemorrhagic conversion is noted within the right basal ganglia infarcts.  CTA head and neck - Right proximal M2 occlusion with distal reconstitution.  CT repeat pending  2D Echo - EF 35-40%.  LDL - 53  HgbA1c - 8.1  VTE prophylaxis - SCDs  Diet heart healthy/carb modified Room service appropriate?: Yes; Fluid consistency:: Thin  aspirin 81 mg daily and warfarin prior to admission, now on no antithrombotics due to hemorrhagic transformation.   Patient counseled to be compliant with her antithrombotic medications  Ongoing aggressive stroke risk factor management  Therapy recommendations:  Pending  Disposition:  Pending  afib on coumadin  Coumadin since 2009 after MI  INR difficult to control at home  INR admission 1.6  Consider eliquis for long term anticoagulation.  Currently no antithrombotic due to hemorrhagic transformation  CT repeat today and then decide timing for eliquis.  Hypertension  Blood pressure low at times. Low ejection fraction.  Permissive hypertension (OK if < 220/120) but gradually normalize in 5-7 days  Hyperlipidemia  Home meds:  Zocor 40 mg daily resumed in hospital  LDL 53, goal < 70  Continue statin at discharge  Diabetes  HgbA1c 8.1, goal < 7.0  Uncontrolled  On lantus  SSI  Other Stroke Risk Factors  Advanced age  Obesity, Body mass index is 45.63 kg/(m^2).   Family history of cerebral hemorrhage ( mother with clotting disorder )  Previous TIA  CAD/MI  CHF EF from 40-45% down to 35-40% this admission, follows with [facility]   Other Active Problems  Chronic kidney disease  Hospital day # 1  Rosalin Hawking, MD PhD Stroke Neurology 03/31/2016 12:29 PM      To contact Stroke Continuity provider,  please refer to [email or web address] After hours, contact General Neurology

## 2016-03-31 NOTE — Progress Notes (Signed)
Subjective: No events overnight. Patient reports no change in her left sided weakness.   Objective: Vital signs in last 24 hours: Filed Vitals:   03/31/16 0216 03/31/16 0620 03/31/16 0923 03/31/16 1328  BP: 133/57 121/45 102/73   Pulse: 75 96 70 71  Temp: 98.4 F (36.9 C) 98.4 F (36.9 C) 99 F (37.2 C) 98.7 F (37.1 C)  TempSrc: Oral Oral Axillary Oral  Resp: 17 18 16 18   Height:      Weight:      SpO2: 98% 100% 97% 97%   Weight change:  No intake or output data in the 24 hours ending 03/31/16 1352   General: resting in bed in NAD HEENT: PERRL, EOMI, no scleral icterus, + mild left lower facial droop, improving Cardiac: irregular distant HS, no rubs, murmurs or gallops Pulm: clear to auscultation bilaterally, no w/r/r, moving normal volumes of air Abd: soft, obese, nontender, nondistended, BS present Ext: warm and well perfused, 1+ B/L pedal edema, chronic venous skin changes Neuro: alert and oriented X3, cranial nerves II-XII grossly intact except for CN VII as above, 5/5 MMS RUE and RLE, 4+/5 LUE and LLE, sensation grossly equal and intact, normal Babinski  Medications: I have reviewed the patient's current medications. Scheduled Meds: . docusate sodium  100 mg Oral BID  . gabapentin  400 mg Oral QHS  . insulin aspart  0-9 Units Subcutaneous TID WC  . insulin glargine  20 Units Subcutaneous QHS  . simvastatin  40 mg Oral QHS  . sodium chloride flush  3 mL Intravenous Q12H  . traMADol  100 mg Oral Daily  . traMADol  50 mg Oral QHS   Continuous Infusions: . sodium chloride Stopped (03/30/16 1625)  . sodium chloride 100 mL/hr at 03/30/16 2154   PRN Meds:.sodium chloride flush Assessment/Plan:  CVA: right MCA infarcts with hemorrhagic transformation: MRI shows acute infarct of the right caudate, lentiform nucleus, insular cortex and portions of the right temporal lobe. Small hemorrhagic conversion within the right basal ganglia infarcts. Neuro exam today with no  change in her symptoms. She has chronic A.Fib with sub-therapeutic INR. CVA likely secondary to emboli from her a. Fib.  Neuro consulted and will get repeat CT head today and decide on anticoagulation.  - hold BP medications for now and allow permissive HTN in the setting of acute CVA - D/C daily ASA with hemorrhagic conversion - repeat CT head, discuss re-starting anticoagulation with neruo. Likely wait a week given the small hemorrhagic conversion.  - 2D ECHO with mildly worse EF 35-40% from prior 40-45%. No evidence of clots.  - Start Lipitor 40 mg daily - telemetry monitoring - follow-up additional neuro recommendations -PT/OT pending, suspect will likely need SNF placement given poor functional status at baseline  Peramanent fibrillation on coumadin: Rate controlled on BB. CHADSVASc score is 6 and she is on coumadin for A/C. INR was subtherapeutic at PCP earlier this week and this admission is 1.59.  - hold BB as above - hold A/C as above  Diabetes: Reports compliance with Lantus 32 units qHS. A1c 8.1. CBG 81 this morning.  - decrease Lantus 20 > 18 units qHs while inpatient to avoid hypoglycemia - SSI  Hyperlipidemia: She reports compliance with simvastatin 40mg . Lipid panel  reveals LDL 53, HDL 39, TG 50, TChol 102.  - given current stroke, would recommend high intensity statin if tolerated (Crestor or Lipitor, whichever is covered by her insurer) -Start Lipitor 40 mg qhs  Coronary atherosclerosis: Stable.  She has not needed to use SL NTG recently. EKG technically meets criteria for LBBB with broad R in I, aVL, V6 (mildly prolonged QRS ~ 125) but morphology otherwise similar to prior EKG and she is asymptomatic w/ negative troponin so I do not suspect underlying ischemia. - hold ASA as above - need statin increase prior to discharge - hold BB, ACEI as above  Chronic systolic heart failure: Stable. EF 40-45% February 2014. EF 35-40% on ECHO here. Her cardiologist is  Dr. Angelena Form and she was last seen in Dec 2016. She reports stable symptoms. She is compliant with her med regimen including BB and ARB.  - hold BB, ARB, Lasix as above  CKD3: Stable. Per Care Everywhere record, her baseline Cr appears to be 1.2-1.4. She is at baseline currently. Her nephrologist is Dr. Audie Clear at [facility].   Diet:  carb modified diet  VTE ppx: SCDs.  Code status: Full  Dispo: Disposition is deferred at this time, awaiting improvement of current medical problems.  Anticipated discharge in approximately 1-2 day(s).   The patient does have a current PCP ([place], PA-C) and does need an [facility] hospital follow-up appointment after discharge.  The patient does not have transportation limitations that hinder transportation to clinic appointments.  .Services Needed at time of discharge: Y = Yes, Blank = No PT:   OT:   RN:   Equipment:   Other:       Maryellen Pile, MD IMTS PGY-1 [phone] 03/31/2016, 1:52 PM

## 2016-03-31 NOTE — Progress Notes (Signed)
Patient lying in bed watching TV with son, complains of back pain, reports that this is chronic and that she takes tramadol 50MG   twice/day. Has a bag of all home ends in the room-->encouraged son to take them all home. Provider notified of chronic pain reports and home use of tramadol. Will continue to monitor.

## 2016-03-31 NOTE — Evaluation (Signed)
Physical Therapy Evaluation Patient Details Name: [name] MRN: [id number] DOB: 04-14-1941 Today's Date: 03/31/2016   History of Present Illness  Patient admitted with sudden onset left-sided weakness and left facial droop.  Work up reveals CVA: right MCA infarcts with hemorrhagic transformation: MRI shows acute infarct of the right caudate, lentiform nucleus, insular cortex and portions of the right temporal lobe. Small hemorrhagic conversion within the right basal ganglia infarcts.  She has chronic A.Fib with sub-therapeutic INR. CVA likely secondary to emboli from her a. Fib. Significant PMH also includes DM, s/p CABG 2009, and obesity.  Clinical Impression  Patient presents with dependencies in all mobility including bed mobility, transfers and gait.  Patient required increased assistance for all mobility.  Patient currently at home alone during day and do not feel she can return to this at current level.  Feel patient will benefit from continued PT to increase independence and mobility.  Depending on progress, feel patient may benefit from inpatient rehab to reach maximum potential for d/c home.      Follow Up Recommendations CIR    Equipment Recommendations  None recommended by PT    Recommendations for Other Services Rehab consult     Precautions / Restrictions Precautions Precautions: Fall      Mobility  Bed Mobility Overal bed mobility: Needs Assistance Bed Mobility: Supine to Sit     Supine to sit: Mod assist;+2 for physical assistance     General bed mobility comments: assist to bring legs off bed and bring trunk off bed.  Assist to scoot to edge of bed.  Transfers Overall transfer level: Needs assistance Equipment used: Rolling walker (2 wheeled) Transfers: Sit to/from [facility] Sit to Stand: Min assist;+2 physical assistance Stand pivot transfers: Min assist;+2 physical assistance       General transfer comment: assist to power up and  balance once standing; +2 for safety - patient with knee buckling in standing when first upright; stand-pivot transfer to recliner.  Ambulation/Gait                Stairs            Wheelchair Mobility    Modified Rankin (Stroke Patients Only) Modified Rankin (Stroke Patients Only) Pre-Morbid Rankin Score: Moderate disability Modified Rankin: Severe disability     Balance Overall balance assessment: Needs assistance Sitting-balance support: No upper extremity supported Sitting balance-Leahy Scale: Fair Sitting balance - Comments: required assistance to get to sitting and finding midline, once there, was able to maintain balance.   Standing balance support: Bilateral upper extremity supported Standing balance-Leahy Scale: Poor Standing balance comment: reliant on RW for balance                             Pertinent Vitals/Pain Pain Assessment: 0-10 Pain Score: 4  Pain Location: back  Pain Descriptors / Indicators: Discomfort;Sore;Aching Pain Intervention(s): Limited activity within patient's tolerance;Monitored during session    Home Living   Living Arrangements: Children Available Help at Discharge: Family (son only available in evenings) Type of Home: House       Home Layout: One level Home Equipment: Walker - 4 wheels      Prior Function Level of Independence: Needs assistance   Gait / Transfers Assistance Needed: per son and patient could ambulate and transfer independently, used rollator for assitance  ADL's / Homemaking Assistance Needed: Aide in 2x/week to assist with bathing  Hand Dominance        Extremity/Trunk Assessment   Upper Extremity Assessment: Defer to OT evaluation           Lower Extremity Assessment: Overall WFL for tasks assessed (limited by body habitus)         Communication   Communication: No difficulties  Cognition Arousal/Alertness: Awake/alert Behavior During Therapy: WFL for tasks  assessed/performed Overall Cognitive Status: Within Functional Limits for tasks assessed                      General Comments      Exercises        Assessment/Plan    PT Assessment Patient needs continued PT services  PT Diagnosis Generalized weakness   PT Problem List Decreased strength;Decreased range of motion;Decreased activity tolerance;Decreased balance;Decreased mobility;Decreased knowledge of use of DME;Obesity  PT Treatment Interventions DME instruction;Gait training;Functional mobility training;Therapeutic activities;Therapeutic exercise;Balance training;Patient/family education   PT Goals (Current goals can be found in the Care Plan section) Acute Rehab PT Goals Patient Stated Goal: go home PT Goal Formulation: With patient/family Time For Goal Achievement: 04/07/16 Potential to Achieve Goals: Fair    Frequency Min 4X/week   Barriers to discharge Decreased caregiver support no assistance during day    Co-evaluation               End of Session Equipment Utilized During Treatment: Gait belt Activity Tolerance: Patient limited by fatigue Patient left: in chair;with family/visitor present;with call bell/phone within reach Nurse Communication: Mobility status    Functional Assessment Tool Used: clinical judgement Functional Limitation: Mobility: Walking and moving around Mobility: Walking and Moving Around Current Status [id number]): At least 60 percent but less than 80 percent impaired, limited or restricted Mobility: Walking and Moving Around Goal Status [phone]): At least 20 percent but less than 40 percent impaired, limited or restricted    Time: [id number] PT Time Calculation (min) (ACUTE ONLY): 32 min   Charges:   PT Evaluation $PT Eval Moderate Complexity: 1 Procedure PT Treatments $Therapeutic Activity: 8-22 mins   PT G Codes:   PT G-Codes **NOT FOR INPATIENT CLASS** Functional Assessment Tool Used: clinical judgement Functional  Limitation: Mobility: Walking and moving around Mobility: Walking and Moving Around Current Status [id number]): At least 60 percent but less than 80 percent impaired, limited or restricted Mobility: Walking and Moving Around Goal Status [id number]): At least 20 percent but less than 40 percent impaired, limited or restricted    [name] 03/31/2016, 2:53 PM  03/31/2016 [name], [place]

## 2016-03-31 NOTE — Progress Notes (Signed)
OT Cancellation Note  Patient Details Name: [name] MRN: [id number] DOB: 02/10/1941   Cancelled Treatment:    Reason Eval/Treat Not Completed: Other (comment) (Pt just finished working with PT; PT reports pt too fatigued). PT advising OT to check back tomorrow for OT eval. Will follow up as appropriate.   Binnie Kand M.S., OTR/L Pager: [phone]  03/31/2016, 3:58 PM

## 2016-04-01 DIAGNOSIS — N3000 Acute cystitis without hematuria: Secondary | ICD-10-CM

## 2016-04-01 DIAGNOSIS — B9689 Other specified bacterial agents as the cause of diseases classified elsewhere: Secondary | ICD-10-CM

## 2016-04-01 DIAGNOSIS — N390 Urinary tract infection, site not specified: Secondary | ICD-10-CM

## 2016-04-01 LAB — GLUCOSE, CAPILLARY
GLUCOSE-CAPILLARY: 128 mg/dL — AB (ref 65–99)
GLUCOSE-CAPILLARY: 216 mg/dL — AB (ref 65–99)
GLUCOSE-CAPILLARY: 221 mg/dL — AB (ref 65–99)
GLUCOSE-CAPILLARY: 188 mg/dL — AB (ref 65–99)

## 2016-04-01 MED ORDER — FUROSEMIDE 40 MG PO TABS
60.0000 mg | ORAL_TABLET | Freq: Every day | ORAL | Status: DC
  Administered 2016-04-01 – 2016-04-02 (×2): 60 mg via ORAL
  Filled 2016-04-01 (×2): qty 1

## 2016-04-01 MED ORDER — DEXTROSE 5 % IV SOLN
2.0000 g | INTRAVENOUS | Status: DC
  Filled 2016-04-01: qty 2

## 2016-04-01 MED ORDER — CARVEDILOL 6.25 MG PO TABS
6.2500 mg | ORAL_TABLET | Freq: Two times a day (BID) | ORAL | Status: DC
  Administered 2016-04-01 – 2016-04-02 (×4): 6.25 mg via ORAL
  Filled 2016-04-01 (×6): qty 1

## 2016-04-01 MED ORDER — DEXTROSE 5 % IV SOLN
1.0000 g | INTRAVENOUS | Status: AC
  Administered 2016-04-01 – 2016-04-03 (×3): 1 g via INTRAVENOUS
  Filled 2016-04-01 (×3): qty 10

## 2016-04-01 NOTE — Progress Notes (Addendum)
Subjective: No events overnight. Repeat Head CT showed stable hemorrhagic infarcts on the right sided. Had some foul urine smell complaints over night. UA on admission was + for nitrites. Patient denies any dysuria or other urinary complaints. Afebrile overnight, Patient reports no change in her left sided weakness.   Objective: Vital signs in last 24 hours: Filed Vitals:   03/31/16 1328 03/31/16 1753 03/31/16 2111 04/01/16 0150  BP:  115/52 128/73 132/65  Pulse: 71 72 76 79  Temp: 98.7 F (37.1 C) 99.1 F (37.3 C) 98.7 F (37.1 C) 98.9 F (37.2 C)  TempSrc: Oral Oral Oral Oral  Resp: 18 18 20 20   Height:      Weight:      SpO2: 97% 98% 97% 99%   Weight change:  No intake or output data in the 24 hours ending 04/01/16 0748   General: resting in bed in NAD HEENT: PERRL, EOMI, no scleral icterus, + mild left lower facial droop, improving Cardiac: irregular distant HS, no rubs, murmurs or gallops Pulm: clear to auscultation bilaterally, no w/r/r, moving normal volumes of air Abd: soft, obese, nontender, nondistended, BS present Ext: warm and well perfused, 1+ B/L pedal edema, chronic venous skin changes Neuro: alert and oriented X3, cranial nerves II-XII grossly intact except for CN VII as above, 5/5 MMS RUE and RLE, 4+/5 LUE and LLE, sensation grossly equal and intact, normal Babinski  Medications: I have reviewed the patient's current medications. Scheduled Meds: . atorvastatin  40 mg Oral q1800  . docusate sodium  100 mg Oral BID  . gabapentin  400 mg Oral QHS  . insulin aspart  0-9 Units Subcutaneous TID WC  . insulin glargine  18 Units Subcutaneous QHS  . sodium chloride flush  3 mL Intravenous Q12H  . traMADol  100 mg Oral Daily  . traMADol  50 mg Oral QHS   Continuous Infusions: . sodium chloride Stopped (03/30/16 1625)  . sodium chloride 100 mL/hr at 03/31/16 1848   PRN Meds:.sodium chloride flush Assessment/Plan:  CVA: right MCA infarcts with hemorrhagic  transformation: MRI shows acute infarct of the right caudate, lentiform nucleus, insular cortex and portions of the right temporal lobe. Small hemorrhagic conversion within the right basal ganglia infarcts. Repeat head CT showed stable hemorrhagic area.   2D ECHO with mildly worse EF 35-40% from prior 40-45%. No evidence of clots Neuro exam today with no change in her symptoms. She has chronic A.Fib with sub-therapeutic INR. CVA likely secondary to emboli from her a. Fib.   - out of permissive HTN window, will resume her BP meds slowly -Will continue to hold ASA and also anticoag for now. May resume later with neuro recs - Started Lipitor 40 mg daily - telemetry monitoring - follow-up additional neuro recommendations -PT recommended CIR, OT eval pending.   UTI with nitrite + UA on admission - RN noted some foul smell overnight which she did not complain ofThat's why on admission she was not started on treatment as this was deemed to be asymptomatic bacteruria, still denies any dysuria. However, since she is elderly and would be at high risk of delirium if she did have worsening UTI, we will treat her for 3 days of ceftriaxone.  - ceftriaxone 5/21 >> 5/23.   Peramanent fibrillation on coumadin PTA: Rate controlled on BB. CHADSVASc score is 6 and she is on coumadin for A/C. INR was subtherapeutic at PCP earlier this week and this admission is 1.59.  - resumed bblocker as  now out of permissive HTN window - hold A/C as above  Diabetes: Reports compliance with Lantus 32 units qHS. A1c 8.1. CBG 81 this morning.  - decrease Lantus 20 > 18 units qHs while inpatient to avoid hypoglycemia - SSI  Hyperlipidemia: She reports compliance with simvastatin 40mg . Lipid panel  reveals LDL 53, HDL 39, TG 50, TChol 102.  - given current stroke, would recommend high intensity statin if tolerated (Crestor or Lipitor, whichever is covered by her insurer) -Started Lipitor 40 mg qhs  Coronary  atherosclerosis: Stable. She has not needed to use SL NTG recently. EKG technically meets criteria for LBBB with broad R in I, aVL, V6 (mildly prolonged QRS ~ 125) but morphology otherwise similar to prior EKG and she is asymptomatic w/ negative troponin so I do not suspect underlying ischemia. - hold ASA as above  Chronic systolic heart failure: Stable. EF 40-45% February 2014. EF 35-40% on ECHO here. Her cardiologist is Dr. Angelena Form and she was last seen in Dec 2016. She reports stable symptoms. She is compliant with her med regimen including BB and ARB.  - hold ARB for now as BP is w/in normal range. Resumed BBLocker. Resume lasix 60mg  daily.  CKD3: Stable. Per Care Everywhere record, her baseline Cr appears to be 1.2-1.4. She is at baseline currently. Her nephrologist is Dr. Audie Clear at [facility].   Diet:  carb modified diet  VTE ppx: SCDs.  Code status: Full  Dispo: Disposition is deferred at this time, awaiting improvement of current medical problems.  Anticipated discharge in approximately 1-2 day(s).   The patient does have a current PCP ([place], PA-C) and does need an [facility] hospital follow-up appointment after discharge.  The patient does not have transportation limitations that hinder transportation to clinic appointments.  .Services Needed at time of discharge: Y = Yes, Blank = No PT:   OT:   RN:   Equipment:   Other:     LOS: 1 day   Dellia Nims, MD  04/01/2016, 7:48 AM

## 2016-04-01 NOTE — Progress Notes (Signed)
Patients urine was foul smelling, NT said it had a "fishy odor" and was almost beige in color...Marland KitchenMarland KitchenBathed patient and she still has an odor to her genital area.  Paged IM intern and he said he would relay message to doctor in morning

## 2016-04-01 NOTE — Progress Notes (Signed)
Internal Medicine Attending:   I saw and examined the patient. I reviewed the resident's note and I agree with the resident's findings and plan as documented in the resident's note.  75 year old woman admitted 5/20 with acute right sided CVA that had associated small hemorrhagic conversion within the right basal ganglia with some edema but no mass effect. Atrial fibrillation anticoagulation is on hold for at least the next 1 week. The patient's neuro deficits are improving slowly. We are planning on CIR consultation today to assess for appropriateness of ongoing rehabilitation.

## 2016-04-01 NOTE — Progress Notes (Signed)
Physical Therapy Treatment Patient Details Name: [name] MRN: [id number] DOB: 1941-07-07 Today's Date: 04/01/2016    History of Present Illness Patient admitted with sudden onset left-sided weakness and left facial droop.  Work up reveals CVA: right MCA infarcts with hemorrhagic transformation: MRI shows acute infarct of the right caudate, lentiform nucleus, insular cortex and portions of the right temporal lobe. Small hemorrhagic conversion within the right basal ganglia infarcts.  She has chronic A.Fib with sub-therapeutic INR. CVA likely secondary to emboli from her a. Fib. Significant PMH also includes DM, s/p CABG 2009, and obesity.    PT Comments    Pt with slow progress with mobility.  Nursing may need to use mechanical lift to transfer pt from recliner as it is a lower surface than the bed.  Pt appears to be motivated.  Will continue to follow patient while on this venue of care to progress mobility. Therapy will continue to follow to assist with discharge planning and follow up recommendations.   Follow Up Recommendations  CIR     Equipment Recommendations  None recommended by PT    Recommendations for Other Services Rehab consult     Precautions / Restrictions Precautions Precautions: Fall Precaution Comments: Rt knee tends to buckle Restrictions Weight Bearing Restrictions: No    Mobility  Bed Mobility Overal bed mobility: Needs Assistance;+2 for physical assistance Bed Mobility: Supine to Sit     Supine to sit: Mod assist;+2 for physical assistance     General bed mobility comments: mod cues for hand placement and sequencing, raised HOB  Transfers Overall transfer level: Needs assistance Equipment used: 4-wheeled walker Transfers: Sit to/from Stand Sit to Stand: Mod assist;+2 physical assistance         General transfer comment: transfer from bed and from [facility] over toilet in bathroom, mod cues for hand placement  Ambulation/Gait Ambulation/Gait  assistance: Mod assist;+2 physical assistance Ambulation Distance (Feet): 6 Feet (to bathroom, 8' into room from bathroom) Assistive device: 4-wheeled walker (bariatric rollator) Gait Pattern/deviations: Step-to pattern;Decreased step length - right;Decreased stance time - right;Shuffle;Trunk flexed;Wide base of support     General Gait Details: poor walker placement, needs mod cues and assist to maintain control of rollator (placed too far anteriorly). Assist to advance Rt leg during gait. 2 episodes of Rt knee buckling in stance phase   Stairs            Wheelchair Mobility    Modified Rankin (Stroke Patients Only) Modified Rankin (Stroke Patients Only) Pre-Morbid Rankin Score: Moderate disability Modified Rankin: Severe disability     Balance Overall balance assessment: Needs assistance Sitting-balance support: No upper extremity supported;Feet supported Sitting balance-Leahy Scale: Fair Sitting balance - Comments: UE support while on bed, able to balance self on commode   Standing balance support: Bilateral upper extremity supported Standing balance-Leahy Scale: Poor Standing balance comment: flexed trunk, heavy reliance on external support                    Cognition Arousal/Alertness: Awake/alert Behavior During Therapy: WFL for tasks assessed/performed Overall Cognitive Status: Within Functional Limits for tasks assessed                      Exercises      General Comments General comments (skin integrity, edema, etc.): bil lower leg swelling      Pertinent Vitals/Pain Pain Assessment: 0-10 Pain Score: 3  Pain Location: back Pain Descriptors / Indicators: Aching;Sore Pain Intervention(s): Limited activity  within patient's tolerance;Monitored during session    Home Living                      Prior Function            PT Goals (current goals can now be found in the care plan section) Acute Rehab PT Goals Patient Stated  Goal: get stronger PT Goal Formulation: With patient/family Time For Goal Achievement: 04/07/16 Potential to Achieve Goals: Fair Progress towards PT goals: Progressing toward goals    Frequency  Min 4X/week    PT Plan Current plan remains appropriate    Co-evaluation PT/OT/SLP Co-Evaluation/Treatment: Yes Reason for Co-Treatment: Complexity of the patient's impairments (multi-system involvement);For patient/therapist safety PT goals addressed during session: Mobility/safety with mobility;Proper use of DME       End of Session Equipment Utilized During Treatment: Gait belt Activity Tolerance: Patient limited by pain Patient left: in chair;with family/visitor present;with call bell/phone within reach     Time: 1026-1055 PT Time Calculation (min) (ACUTE ONLY): 29 min  Charges:  $Gait Training: 8-22 mins $Therapeutic Activity: 8-22 mins                    G CodesMalka So, PT [phone]  [place] 04/01/2016, 1:49 PM

## 2016-04-01 NOTE — Progress Notes (Signed)
STROKE TEAM PROGRESS NOTE   SUBJECTIVE (INTERVAL HISTORY) Her son is at the bedside.  No acute event overnight. CT repeat showed stable hemorrhagic transformation. Will hold off antiplatelet and eliquis for now.   OBJECTIVE Temp:  [98.7 F (37.1 C)-99.6 F (37.6 C)] 99.6 F (37.6 C) (05/21 1725) Pulse Rate:  [67-89] 67 (05/21 1725) Cardiac Rhythm:  [-] Atrial fibrillation (05/21 0749) Resp:  [18-20] 18 (05/21 1725) BP: (104-132)/(42-73) 104/43 mmHg (05/21 1725) SpO2:  [94 %-99 %] 95 % (05/21 1725)  CBC:   Recent Labs Lab 03/30/16 0605  WBC 6.0  NEUTROABS 3.4  HGB 12.6  HCT 40.6  MCV 91.4  PLT 130*    Basic Metabolic Panel:   Recent Labs Lab 03/30/16 0740  NA 141  K 4.0  CL 103  CO2 27  GLUCOSE 101*  BUN 28*  CREATININE 1.32*  CALCIUM 9.1    Lipid Panel:     Component Value Date/Time   CHOL 102 03/30/2016 0740   TRIG 50 03/30/2016 0740   HDL 39* 03/30/2016 0740   CHOLHDL 2.6 03/30/2016 0740   VLDL 10 03/30/2016 0740   LDLCALC 53 03/30/2016 0740   HgbA1c:  Lab Results  Component Value Date   HGBA1C 8.1* 03/30/2016   Urine Drug Screen:     Component Value Date/Time   LABOPIA NONE DETECTED 03/30/2016 1258   COCAINSCRNUR NONE DETECTED 03/30/2016 1258   LABBENZ NONE DETECTED 03/30/2016 1258   AMPHETMU NONE DETECTED 03/30/2016 1258   THCU NONE DETECTED 03/30/2016 1258   LABBARB NONE DETECTED 03/30/2016 1258      IMAGING I have personally reviewed the radiological images below and agree with the radiology interpretations.  Ct Angio Head and neck W/cm &/or Wo Cm 03/30/2016   1. Right proximal M2 occlusion with distal reconstitution. There is completed infarct in the right caudate, putamen, internal capsule, and posterior insula.  2. No flow limiting stenosis or embolic source identified in the neck.   Ct Head Wo Contrast 03/30/2016   1. Focal hypodensity within the right basal ganglia, highly suspicious for evolving ischemia. Possible extension  into the right subinsular region and insular cortex may be present as well. No associated hemorrhage.  2. Asymmetric hyperdensity within the distal right M1 segment, which may reflect intraluminal thrombus versus atheromatous plaque.  3. Remote pontine infarct.  4. Generalized cerebral atrophy with chronic microvascular ischemic disease and intracranial atherosclerosis.   Ct Angio Head and Neck W/cm &/or Wo/cm 03/30/2016   1. Right proximal M2 occlusion with distal reconstitution. There is completed infarct in the right caudate, putamen, internal capsule, and posterior insula.  2. No flow limiting stenosis or embolic source identified in the neck.   Mr Brain Wo Contrast 03/30/2016   Hemorrhagic conversion is noted within the right basal ganglia infarcts since the earlier CT scan.   Edema and mass effect is noted with the petechial hemorrhage and infarct.  There is partial effacement of the right lateral ventricle. T2 changes are associated with the areas of acute infarct. Impression 1. Acute/subacute infarcts involve the right caudate head, lentiform nucleus, insular cortex, and portions of the right temporal lobe.  2. Moderate generalized atrophy and diffuse white matter disease likely reflects the sequela of chronic microvascular ischemia in addition to the acute infarcts.   Transthoracic echocardiogram 03/30/2016 Study Conclusions - Left ventricle: The cavity size was normal. Wall thickness was  normal. Systolic function was moderately reduced. The estimated  ejection fraction was in the range of 35%  to 40%. Diffuse  hypokinesis. Doppler parameters are consistent with high  ventricular filling pressure. - Aortic valve: There was trivial regurgitation. - Mitral valve: Calcified annulus. There was mild regurgitation. - Left atrium: The atrium was mildly dilated. - Right atrium: The atrium was mildly dilated. - Tricuspid valve: There was moderate regurgitation. - Pulmonary arteries:  Systolic pressure was moderately increased.  PA peak pressure: 49 mm Hg (S). Impressions: - Technically difficult; definity used; moderate global LV  dysfunction (EF 49); trace AI; mild MR; mild biatrial  enlargement; moderate TR; moderately elevated pulmonary pressure.  Ct Head Wo Contrast   03/31/2016  IMPRESSION: 1. Stable appearance of hemorrhagic infarcts involving the right lentiform nucleus and caudate head without expansion. 2. Expected evolution of nonhemorrhagic infarcts involving the posterior right insular cortex and temporal lobe. 3. Stable white matter disease without other acute infarct.    PHYSICAL EXAM  Temp:  [98.7 F (37.1 C)-99.6 F (37.6 C)] 99.6 F (37.6 C) (05/21 1725) Pulse Rate:  [67-89] 67 (05/21 1725) Resp:  [18-20] 18 (05/21 1725) BP: (104-132)/(42-73) 104/43 mmHg (05/21 1725) SpO2:  [94 %-99 %] 95 % (05/21 1725)  General - Well nourished, well developed, in no apparent distress.  Ophthalmologic - Fundi not visualized due to small pupils.  Cardiovascular - irregularly irregular heart rate and rhythm.  Mental Status -  Level of arousal and orientation to time, place, and person were intact. Language including expression, naming, repetition, comprehension was assessed and found intact. Fund of Knowledge was assessed and was intact.  Cranial Nerves II - XII - II - Visual field intact OU. III, IV, VI - Extraocular movements intact. V - Facial sensation intact bilaterally. VII - mild left nasolabial fold flattening. VIII - Hearing & vestibular intact bilaterally. X - Palate elevates symmetrically. XI - Chin turning & shoulder shrug intact bilaterally. XII - Tongue protrusion intact.  Motor Strength - The patient's strength was in all extremities except left bicep and tricep 4+/5 as well as left LE proximal 3/5 due to back pain and pronator drift was absent.  Bulk was normal and fasciculations were absent.   Motor Tone - Muscle tone was assessed at  the neck and appendages and was normal.  Reflexes - The patient's reflexes were 1+ in all extremities and she had no pathological reflexes.  Sensory - Light touch, temperature/pinprick were assessed and were symmetrical.    Coordination - The patient had normal movements in the hands with no ataxia or dysmetria.  Tremor was absent.  Gait and Station - deferred due to safety concerns.   ASSESSMENT/PLAN Ms. QUINISHA CERMINARA is a 75 y.o. female with history of coronary artery disease, ischemic cardiomyopathy, congestive heart failure, atrial fibrillation, Coumadin therapy, hyperlipidemia, obesity, hypertension, diabetes mellitus, previous TIA, and chronic kidney disease presenting with left facial weakness, slurred speech, and subtherapeutic INR.  She did not receive IV t-PA due to late presentation.  Stroke: right MCA infarcts with hemorrhagic transformation felt to be embolic secondary to atrial fibrillation with a subtherapeutic INR.  Resultant  Subtle left sided weakness  MRI - Acute/subacute infarcts involve the right caudate head, lentiform nucleus, insular cortex, and portions of the right temporal lobe. Hemorrhagic conversion is noted within the right basal ganglia infarcts.  CTA head and neck - Right proximal M2 occlusion with distal reconstitution.  CT repeat stable hematoma  2D Echo - EF 35-40%.  LDL - 53  HgbA1c - 8.1  VTE prophylaxis - SCDs Diet heart healthy/carb modified Room  service appropriate?: Yes; Fluid consistency:: Thin  aspirin 81 mg daily and warfarin prior to admission, now on no antithrombotics due to hemorrhagic transformation.   Patient counseled to be compliant with her antithrombotic medications  Ongoing aggressive stroke risk factor management  Therapy recommendations:  Pending  Disposition:  Pending  afib on coumadin  Coumadin since 2009 after MI  INR difficult to control at home  INR admission 1.6  Consider eliquis for long term  anticoagulation.  Currently no antithrombotic due to hemorrhagic transformation  CT repeat showed stable hemorrhagic transformation.  Will recommend to start eliquis  after a repeat CT head at day 7 post stroke. Likely to start while at CIR.   Hypertension  Blood pressure low at times. Low ejection fraction. BP goal < 160 due to hemorrhagic transformation  Hyperlipidemia  Home meds:  Zocor 40 mg daily resumed in hospital  LDL 53, goal < 70  Continue statin at discharge  Diabetes  HgbA1c 8.1, goal < 7.0  Uncontrolled  On lantus  SSI  Other Stroke Risk Factors  Advanced age  Obesity, Body mass index is 45.63 kg/(m^2).   Family history of cerebral hemorrhage ( mother with clotting disorder )  Previous TIA  CAD/MI  CHF EF from 40-45% down to 35-40% this admission, follows with [facility]   Other Active Problems  Chronic kidney disease  Hospital day # 11  Neurology will sign off. Please call with questions. Pt will follow up with Dr. Erlinda Hong at [facility] in about 2 months. Thanks for the consult.  Rosalin Hawking, MD PhD Stroke Neurology 04/01/2016 6:23 PM      To contact Stroke Continuity provider, please refer to [email or web address] After hours, contact General Neurology

## 2016-04-01 NOTE — Evaluation (Signed)
Occupational Therapy Evaluation Patient Details Name: [name] MRN: [id number] DOB: 07-10-1941 Today's Date: 04/01/2016    History of Present Illness Patient admitted with sudden onset left-sided weakness and left facial droop.  Work up reveals CVA: right MCA infarcts with hemorrhagic transformation: MRI shows acute infarct of the right caudate, lentiform nucleus, insular cortex and portions of the right temporal lobe. Small hemorrhagic conversion within the right basal ganglia infarcts.  She has chronic A.Fib with sub-therapeutic INR. CVA likely secondary to emboli from her a. Fib. Significant PMH also includes DM, s/p CABG 2009, and obesity.   Clinical Impression   Pt repots she received assist with bathing from a  [facility] aide but was completing dressing and mobility with use of rollator independently PTA. Currently pt requires mod assist +2 for functional mobility, min assist for UB ADLs, and max assist for LB ADLs. Pt motivated to return home and to PLOF; very agreeable to participation in therapy. Recommending CIR level therapies for follow up in order to maximize independence and safety with ADLs and functional mobility prior to return home. Pt would benefit from continued skilled OT to address established goals.    Follow Up Recommendations  CIR;Supervision/Assistance - 24 hour    Equipment Recommendations  Other (comment) (TBD)    Recommendations for Other Services Rehab consult     Precautions / Restrictions Precautions Precautions: Fall Precaution Comments: Rt knee tends to buckle Restrictions Weight Bearing Restrictions: No      Mobility Bed Mobility Overal bed mobility: Needs Assistance;+2 for physical assistance Bed Mobility: Supine to Sit     Supine to sit: Mod assist;+2 for physical assistance     General bed mobility comments: mod cues for hand placement and sequencing, raised HOB  Transfers Overall transfer level: Needs assistance Equipment used:  4-wheeled walker Transfers: Sit to/from Stand Sit to Stand: Mod assist;+2 physical assistance         General transfer comment: transfer from bed and from [facility] over toilet in bathroom, mod cues for hand placement    Balance Overall balance assessment: Needs assistance Sitting-balance support: No upper extremity supported;Feet supported Sitting balance-Leahy Scale: Fair Sitting balance - Comments: UE support while on bed, able to balance self on commode   Standing balance support: Bilateral upper extremity supported;During functional activity Standing balance-Leahy Scale: Poor Standing balance comment: heavy reliance on RW for support                            ADL Overall ADL's : Needs assistance/impaired Eating/Feeding: Independent;Sitting   Grooming: Set up;Sitting;Supervision/safety   Upper Body Bathing: Minimal assitance;Sitting   Lower Body Bathing: Maximal assistance;Sit to/from stand   Upper Body Dressing : Minimal assistance;Sitting   Lower Body Dressing: Maximal assistance;Sit to/from stand Lower Body Dressing Details (indicate cue type and reason): Pt unable to reach feet. Required assist to start underwear over feet and assist to pull up once in standing. Toilet Transfer: Moderate assistance;+2 for physical assistance;Ambulation;BSC;RW (BSC over toilet)   Toileting- Clothing Manipulation and Hygiene: Moderate assistance;Sit to/from stand;+2 for physical assistance Toileting - Clothing Manipulation Details (indicate cue type and reason): +2 for sit to stand. Min guard for toilet hygiene in sitting. Mod assist for clothing     Functional mobility during ADLs: Moderate assistance;+2 for physical assistance;Rolling walker General ADL Comments: Attempted mobility with bari rollator since pt uses one at home; feel pt would be safer with bari RW.     Vision  Perception     Praxis      Pertinent Vitals/Pain Pain Assessment: 0-10 Pain Score: 3   Pain Location: back Pain Descriptors / Indicators: Aching;Sore Pain Intervention(s): Limited activity within patient's tolerance;Monitored during session;Repositioned     Hand Dominance     Extremity/Trunk Assessment Upper Extremity Assessment Upper Extremity Assessment: Overall WFL for tasks assessed   Lower Extremity Assessment Lower Extremity Assessment: Defer to PT evaluation   Cervical / Trunk Assessment Cervical / Trunk Assessment: Other exceptions Cervical / Trunk Exceptions: obesity   Communication Communication Communication: No difficulties   Cognition Arousal/Alertness: Awake/alert Behavior During Therapy: WFL for tasks assessed/performed Overall Cognitive Status: Within Functional Limits for tasks assessed                     General Comments       Exercises       Shoulder Instructions      Home Living Family/patient expects to be discharged to:: Inpatient rehab                                        Prior Functioning/Environment Level of Independence: Needs assistance  Gait / Transfers Assistance Needed: per son and patient could ambulate and transfer independently, used rollator for assitance ADL's / Homemaking Assistance Needed: Aide in 2x/week to assist with bathing        OT Diagnosis: Generalized weakness;Acute pain   OT Problem List: Decreased strength;Decreased range of motion;Decreased activity tolerance;Impaired balance (sitting and/or standing);Decreased knowledge of use of DME or AE;Decreased knowledge of precautions;Obesity;Impaired UE functional use;Pain   OT Treatment/Interventions: Self-care/ADL training;Therapeutic exercise;Energy conservation;DME and/or AE instruction;Therapeutic activities;Patient/family education;Balance training    OT Goals(Current goals can be found in the care plan section) Acute Rehab OT Goals Patient Stated Goal: get stronger OT Goal Formulation: With patient/family Time For Goal  Achievement: 04/15/16 Potential to Achieve Goals: Good ADL Goals Pt Will Perform Grooming: with supervision;standing Pt Will Perform Upper Body Bathing: with modified independence;sitting Pt Will Perform Lower Body Bathing: with min assist;sit to/from stand Pt Will Transfer to Toilet: with min assist;ambulating;bedside commode Pt Will Perform Toileting - Clothing Manipulation and hygiene: with supervision;sit to/from stand  OT Frequency: Min 2X/week   Barriers to D/C:            Co-evaluation PT/OT/SLP Co-Evaluation/Treatment: Yes Reason for Co-Treatment: Complexity of the patient's impairments (multi-system involvement);For patient/therapist safety PT goals addressed during session: Mobility/safety with mobility;Proper use of DME OT goals addressed during session: ADL's and self-care;Other (comment) (functional mobility)      End of Session Equipment Utilized During Treatment: Gait belt;Rolling walker Nurse Communication: Mobility status;Need for lift equipment  Activity Tolerance: Patient tolerated treatment well Patient left: in chair;with call bell/phone within reach;with family/visitor present   Time: [id number] OT Time Calculation (min): 31 min Charges:  OT General Charges $OT Visit: 1 Procedure OT Evaluation $OT Eval Moderate Complexity: 1 Procedure G-Codes:     Binnie Kand M.S., OTR/L Pager: [phone]  04/01/2016, 2:19 PM

## 2016-04-01 NOTE — Progress Notes (Signed)
Pharmacy Antibiotic Note  [name] is a 75 y.o. female admitted on 03/30/2016 now with UTI.  Pharmacy has been consulted for Ceftriaxone dosing.  5/21 urine cx >>  Plan: Ceftriaxone 2g q24h Start after collection of urine culture No dose adjustments for renal function. Pharmacy will sign off  Height: 5\' 8"  (172.7 cm) Weight: 300 lb (136.079 kg) IBW/kg (Calculated) : 63.9  Temp (24hrs), Avg:98.9 F (37.2 C), Min:98.7 F (37.1 C), Max:99.1 F (37.3 C)   Recent Labs Lab 03/30/16 0605 03/30/16 0740  WBC 6.0  --   CREATININE  --  1.32*    Estimated Creatinine Clearance: 52.3 mL/min (by C-G formula based on Cr of 1.32).    Allergies  Allergen Reactions  . Benazepril Other (See Comments)  . Fe-Succ-C-Thre-B12-Des Stomach Other (See Comments)    Pt doesn't remember   . Spironolactone Other (See Comments)    Dizziness   . Sulfamethoxazole-Trimethoprim Other (See Comments)    Pt doesn't remember    Thank you for allowing pharmacy to be a part of this patient's care.  Angela Burke, PharmD Pharmacy Resident Pager: [phone] 04/01/2016 8:16 AM

## 2016-04-02 DIAGNOSIS — I638 Other cerebral infarction: Secondary | ICD-10-CM

## 2016-04-02 DIAGNOSIS — R791 Abnormal coagulation profile: Secondary | ICD-10-CM

## 2016-04-02 DIAGNOSIS — R0682 Tachypnea, not elsewhere classified: Secondary | ICD-10-CM

## 2016-04-02 DIAGNOSIS — E1159 Type 2 diabetes mellitus with other circulatory complications: Secondary | ICD-10-CM

## 2016-04-02 DIAGNOSIS — I5043 Acute on chronic combined systolic (congestive) and diastolic (congestive) heart failure: Secondary | ICD-10-CM | POA: Insufficient documentation

## 2016-04-02 DIAGNOSIS — N183 Chronic kidney disease, stage 3 (moderate): Secondary | ICD-10-CM

## 2016-04-02 DIAGNOSIS — D696 Thrombocytopenia, unspecified: Secondary | ICD-10-CM | POA: Insufficient documentation

## 2016-04-02 DIAGNOSIS — Z7901 Long term (current) use of anticoagulants: Secondary | ICD-10-CM

## 2016-04-02 DIAGNOSIS — I25810 Atherosclerosis of coronary artery bypass graft(s) without angina pectoris: Secondary | ICD-10-CM

## 2016-04-02 DIAGNOSIS — I639 Cerebral infarction, unspecified: Secondary | ICD-10-CM

## 2016-04-02 DIAGNOSIS — I130 Hypertensive heart and chronic kidney disease with heart failure and stage 1 through stage 4 chronic kidney disease, or unspecified chronic kidney disease: Secondary | ICD-10-CM

## 2016-04-02 DIAGNOSIS — I5042 Chronic combined systolic (congestive) and diastolic (congestive) heart failure: Secondary | ICD-10-CM

## 2016-04-02 DIAGNOSIS — E1142 Type 2 diabetes mellitus with diabetic polyneuropathy: Secondary | ICD-10-CM | POA: Insufficient documentation

## 2016-04-02 DIAGNOSIS — I63411 Cerebral infarction due to embolism of right middle cerebral artery: Principal | ICD-10-CM

## 2016-04-02 DIAGNOSIS — I10 Essential (primary) hypertension: Secondary | ICD-10-CM | POA: Insufficient documentation

## 2016-04-02 DIAGNOSIS — I481 Persistent atrial fibrillation: Secondary | ICD-10-CM

## 2016-04-02 LAB — BASIC METABOLIC PANEL
Anion gap: 7 (ref 5–15)
BUN: 25 mg/dL — AB (ref 6–20)
CHLORIDE: 103 mmol/L (ref 101–111)
CO2: 26 mmol/L (ref 22–32)
CREATININE: 1.23 mg/dL — AB (ref 0.44–1.00)
Calcium: 8.7 mg/dL — ABNORMAL LOW (ref 8.9–10.3)
GFR calc non Af Amer: 41 mL/min — ABNORMAL LOW (ref >60)
GFR, EST AFRICAN AMERICAN: 48 mL/min — AB (ref >60)
Glucose, Bld: 135 mg/dL — ABNORMAL HIGH (ref 65–99)
Potassium: 3.8 mmol/L (ref 3.5–5.1)
SODIUM: 136 mmol/L (ref 135–145)

## 2016-04-02 LAB — GLUCOSE, CAPILLARY
GLUCOSE-CAPILLARY: 170 mg/dL — AB (ref 65–99)
Glucose-Capillary: 119 mg/dL — ABNORMAL HIGH (ref 65–99)
Glucose-Capillary: 123 mg/dL — ABNORMAL HIGH (ref 65–99)
Glucose-Capillary: 173 mg/dL — ABNORMAL HIGH (ref 65–99)

## 2016-04-02 MED ORDER — FUROSEMIDE 40 MG PO TABS
40.0000 mg | ORAL_TABLET | Freq: Every day | ORAL | Status: DC
  Administered 2016-04-03 – 2016-04-04 (×2): 40 mg via ORAL
  Filled 2016-04-02 (×2): qty 1

## 2016-04-02 MED ORDER — LOSARTAN POTASSIUM 25 MG PO TABS
12.5000 mg | ORAL_TABLET | Freq: Every day | ORAL | Status: DC
  Administered 2016-04-02: 12.5 mg via ORAL
  Filled 2016-04-02 (×2): qty 0.5

## 2016-04-02 MED ORDER — ACETAMINOPHEN 500 MG PO TABS
1000.0000 mg | ORAL_TABLET | Freq: Four times a day (QID) | ORAL | Status: DC | PRN
  Administered 2016-04-02: 1000 mg via ORAL
  Filled 2016-04-02: qty 2

## 2016-04-02 NOTE — Discharge Summary (Deleted)
Name: [name] MRN: [id number] DOB: February 16, 1941 75 y.o. PCP: Daphene Jaeger, PA-C  Date of Admission: 03/30/2016  5:51 AM Date of Discharge: 04/03/2016 Attending Physician: Sid Falcon, MD  Discharge Diagnosis: Principal Problem:   CVA (cerebral vascular accident) [facility]) Active Problems:   Diabetes ([place])   Hyperlipidemia   Coronary atherosclerosis   ATRIAL FIBRILLATION   SYSTOLIC HEART FAILURE, CHRONIC   CKD (chronic kidney disease) stage 3, GFR 30-59 ml/min   Chronic anticoagulation   Acute hemorrhagic infarction of brain ([place])   Cerebrovascular accident (CVA) due to embolism of right middle cerebral artery ([place])   Subtherapeutic international normalized ratio (INR)   Acute CVA (cerebrovascular accident) ([place])   UTI (urinary tract infection)   Chronic combined systolic and diastolic congestive heart failure (HCC)   Benign essential HTN   DM type 2 with diabetic peripheral neuropathy ([place])   Tachypnea   Thrombocytopenia (HCC)  Discharge Medications:   Medication List    STOP taking these medications        aspirin EC 81 MG tablet     losartan 25 MG tablet  Commonly known as:  COZAAR     simvastatin 40 MG tablet  Commonly known as:  ZOCOR     warfarin 1 MG tablet  Commonly known as:  COUMADIN     warfarin 5 MG tablet  Commonly known as:  COUMADIN      TAKE these medications        atorvastatin 40 MG tablet  Commonly known as:  LIPITOR  Take 1 tablet (40 mg total) by mouth daily at 6 PM.     carvedilol 6.25 MG tablet  Commonly known as:  COREG  Take 1 tablet (6.25 mg total) by mouth 2 (two) times daily with a meal.     cholecalciferol 1000 units tablet  Commonly known as:  VITAMIN D  Take 1,000 Units by mouth 2 (two) times daily.     docusate sodium 100 MG capsule  Commonly known as:  COLACE  Take 100 mg by mouth 2 (two) times daily.     furosemide 40 MG tablet  Commonly known as:  LASIX  Take 1 tablet (40 mg total) by mouth daily.     gabapentin 400 MG capsule  Commonly known as:  NEURONTIN  Take 400 mg by mouth at bedtime.     insulin glargine 100 UNIT/ML injection  Commonly known as:  LANTUS  Inject 32 Units into the skin at bedtime.     nitroGLYCERIN 0.4 MG SL tablet  Commonly known as:  NITROSTAT  Place 0.4 mg under the tongue every 5 (five) minutes as needed. May repeat for up to 3 doses.     potassium chloride SA 20 MEQ tablet  Commonly known as:  K-DUR,KLOR-CON  Take 20 mEq by mouth daily.     rOPINIRole 0.5 MG tablet  Commonly known as:  REQUIP  Take 1 mg by mouth at bedtime.     traMADol 50 MG tablet  Commonly known as:  ULTRAM  Take 50-100 mg by mouth 2 (two) times daily. Take 2 tablets every morning and take 1 tablet at night     vitamin B-12 1000 MCG tablet  Commonly known as:  CYANOCOBALAMIN  Take 1,000 mcg by mouth daily.        Disposition and follow-up:   [name] was discharged from [facility] in Good condition.  At the hospital follow up visit please address:  CVA with Hemorrhagic Changes: Holding AC given hemorrhagic changes. Will need repeat head CT on 5/27 to assess for resolution. If improved can start Eliquis bid and ASA 81 mg daily. Changed statin to atorvastatin 40 mg.  HTN: SBP dropped to 90-100 upon resuming losartan at half dose (12.5mg ). Holding losartan at discharge. Will continue Carvedilol 6.25 mg bid and decrease lasix to 40 mg daily from 60 mg daily. Please assess BP and adjust medications as indicated. She would benefit from losartan given her DM if BP able to tolerate.  B/L hand tingling: Reports 2-3 month history. Would recommend EMG testing outpatient.   2.  Labs / imaging needed at time of follow-up: CT head 5/27  3.  Pending labs/ test needing follow-up: None  Follow-up Appointments: Follow-up Information    Follow up with Xu,Jindong, MD. Schedule an appointment as soon as possible for a visit in 2 months.   Specialty:  Neurology    Why:  stroke clinic   Contact information:   [address] [place] [id number] [phone]       Schedule an appointment as soon as possible for a visit with HOUT, BRITTANY, PA-C.   Specialty:  Physician Assistant   Why:  hospital follow up   Contact information:   [facility]. [address] [place] [address] [phone]       Discharge Instructions: Discharge Instructions    Ambulatory referral to Neurology    Complete by:  As directed   Pt will follow up with Dr. Erlinda Hong at [facility] in about 2 months. Thanks.     Diet - low sodium heart healthy    Complete by:  As directed      Increase activity slowly    Complete by:  As directed            Consultations:    Procedures Performed:  Ct Angio Head W/cm &/or Wo Cm  03/30/2016  CLINICAL DATA:  Left facial numbness and slurred speech since this morning. Stroke. EXAM: CT ANGIOGRAPHY HEAD AND NECK TECHNIQUE: Multidetector CT imaging of the head and neck was performed using the standard protocol during bolus administration of intravenous contrast. Multiplanar CT image reconstructions and MIPs were obtained to evaluate the vascular anatomy. Carotid stenosis measurements (when applicable) are obtained utilizing NASCET criteria, using the distal internal carotid diameter as the denominator. CONTRAST:  Dose currently not available, reference EMR. COMPARISON:  None. FINDINGS: CTA NECK Aortic arch: Negative for aneurysm. Status post CABG. No dissection. Three vessel branching Right carotid system: Predominately calcified atherosclerosis at the bifurcation without flow limiting (greater than 50%) stenosis. No dissection or ulceration is seen. Left carotid system: Calcific plaque at the bifurcation without stenosis greater than 50%. No dissection or ulceration. Vertebral arteries:No flow limiting subclavian artery stenosis. Left dominant. Calcific plaque at the left vertebral artery origin without flow limiting stenosis.  No stenosis or dissection to the dura. Skeleton: No contributory finding. Diffuse facet and disc degeneration. Chronic anterior dislocation of the right sternoclavicular joint. Other neck: No incidental mass or adenopathy detected in the neck. CTA HEAD Anterior circulation: Atherosclerosis of the carotid siphons without stenosis. Anterior communicating artery and left posterior communicating artery. Abrupt non filling of a proximal right M2 branch as it enters the sylvian fissure. There is downstream reconstitution which is fairly prompt and symmetric. No other embolism is seen. Posterior circulation: Left dominant. The right vertebral artery ends in PICA. Smooth and widely patent vertebral and basilar arteries. No branch  occlusion or flow limiting stenosis. Venous sinuses: Patent Anatomic variants: None significant Delayed phase: Completed infarct in the right caudate and putamen and intervening white matter. Completed infarct in the posterior right insula Dr Silverio Decamp paged at 10:48 and :52 a.m. These results were called by telephone at the time of interpretation on 03/30/2016 at 11:03 am to Dr. Silverio Decamp , who verbally acknowledged these results. IMPRESSION: 1. Right proximal M2 occlusion with distal reconstitution. There is completed infarct in the right caudate, putamen, internal capsule, and posterior insula. 2. No flow limiting stenosis or embolic source identified in the neck. Electronically Signed   By: Monte Fantasia M.D.   On: 03/30/2016 11:05   Ct Head Wo Contrast  03/31/2016  CLINICAL DATA:  Hemorrhagic infarct. EXAM: CT HEAD WITHOUT CONTRAST TECHNIQUE: Contiguous axial images were obtained from the base of the skull through the vertex without intravenous contrast. COMPARISON:  MRI brain 03/30/2016.  CT head 03/30/2016. FINDINGS: Hemorrhagic infarcts involving the right lentiform nucleus and caudate head are stable. There is continued evolution of nonhemorrhagic infarcts involving the posterior right  lentiform nucleus and temporal lobe. No new areas of acute infarct are present. There is no expansion of hemorrhage within the right basal ganglia. The left basal ganglia are intact. Minimal mucosal thickening is present in the left maxillary sinus. The remaining paranasal sinuses and the mastoid air cells are clear. Dense calcifications are present within the cavernous internal carotid arteries bilaterally. The calvarium is intact. No significant extracranial soft tissue lesions are present. IMPRESSION: 1. Stable appearance of hemorrhagic infarcts involving the right lentiform nucleus and caudate head without expansion. 2. Expected evolution of nonhemorrhagic infarcts involving the posterior right insular cortex and temporal lobe. 3. Stable white matter disease without other acute infarct. Electronically Signed   By: San Morelle M.D.   On: 03/31/2016 21:34   Ct Head Wo Contrast  03/30/2016  ADDENDUM REPORT: 03/30/2016 08:06 ADDENDUM: Study discussed by telephone with Dr. Delora Fuel on [id number] at 0755 hours. We discussed additional workup for presumed emergent right MCA occlusion as the patient presents with left hemiparesis, but unknown time of ictus. There are early changes of right MCA territory infarct. ASPECTS score = 7 (changes in the caudate, lentiform, and insula). [place] Stroke Program Early CT Score Normal score = 10 Electronically Signed   By: Genevie Ann M.D.   On: 03/30/2016 08:06  03/30/2016  CLINICAL DATA:  Initial evaluation for acute left hemi paresis. EXAM: CT HEAD WITHOUT CONTRAST TECHNIQUE: Contiguous axial images were obtained from the base of the skull through the vertex without intravenous contrast. COMPARISON:  None. FINDINGS: Diffuse prominence of the CSF containing spaces is compatible with generalized age-related cerebral atrophy. Patchy hypodensity within the periventricular and deep white matter both cerebral hemispheres most compatible chronic small vessel ischemic disease.  Prominent vascular calcifications within the carotid siphons and distal vertebral arteries. There is vague hypodensity involving the right basal ganglia, specifically involving the right caudate head and lentiform nuclei, concerning for evolving ischemia. Possible extension into the right subinsular region and/or insular cortex. No associated hemorrhage or significant mass effect. Question of asymmetric hyperdensity within the distal right M1 segment, which may reflect intraluminal thrombus (series 201, image 11). Deep gray nuclei otherwise maintained. No other definite evolving cortical ischemia. No left cerebral or cerebellar infarcts identified. Focal hypodensity within the central pons favored to be chronic in nature. No mass lesion, midline shift, or significant mass effect. No hydrocephalus. No extra-axial fluid collection. Scalp soft tissues within  normal limits. No acute abnormality about the orbits. Paranasal sinuses are clear.  No mastoid effusion. Calvarium intact. IMPRESSION: 1. Focal hypodensity within the right basal ganglia, highly suspicious for evolving ischemia. Possible extension into the right subinsular region and insular cortex may be present as well. No associated hemorrhage. 2. Asymmetric hyperdensity within the distal right M1 segment, which may reflect intraluminal thrombus versus atheromatous plaque. 3. Remote pontine infarct. 4. Generalized cerebral atrophy with chronic microvascular ischemic disease and intracranial atherosclerosis. Electronically Signed: By: Jeannine Boga M.D. On: 03/30/2016 07:18   Ct Angio Neck W/cm &/or Wo/cm  03/30/2016  CLINICAL DATA:  Left facial numbness and slurred speech since this morning. Stroke. EXAM: CT ANGIOGRAPHY HEAD AND NECK TECHNIQUE: Multidetector CT imaging of the head and neck was performed using the standard protocol during bolus administration of intravenous contrast. Multiplanar CT image reconstructions and MIPs were obtained to  evaluate the vascular anatomy. Carotid stenosis measurements (when applicable) are obtained utilizing NASCET criteria, using the distal internal carotid diameter as the denominator. CONTRAST:  Dose currently not available, reference EMR. COMPARISON:  None. FINDINGS: CTA NECK Aortic arch: Negative for aneurysm. Status post CABG. No dissection. Three vessel branching Right carotid system: Predominately calcified atherosclerosis at the bifurcation without flow limiting (greater than 50%) stenosis. No dissection or ulceration is seen. Left carotid system: Calcific plaque at the bifurcation without stenosis greater than 50%. No dissection or ulceration. Vertebral arteries:No flow limiting subclavian artery stenosis. Left dominant. Calcific plaque at the left vertebral artery origin without flow limiting stenosis. No stenosis or dissection to the dura. Skeleton: No contributory finding. Diffuse facet and disc degeneration. Chronic anterior dislocation of the right sternoclavicular joint. Other neck: No incidental mass or adenopathy detected in the neck. CTA HEAD Anterior circulation: Atherosclerosis of the carotid siphons without stenosis. Anterior communicating artery and left posterior communicating artery. Abrupt non filling of a proximal right M2 branch as it enters the sylvian fissure. There is downstream reconstitution which is fairly prompt and symmetric. No other embolism is seen. Posterior circulation: Left dominant. The right vertebral artery ends in PICA. Smooth and widely patent vertebral and basilar arteries. No branch occlusion or flow limiting stenosis. Venous sinuses: Patent Anatomic variants: None significant Delayed phase: Completed infarct in the right caudate and putamen and intervening white matter. Completed infarct in the posterior right insula Dr Silverio Decamp paged at 10:48 and :52 a.m. These results were called by telephone at the time of interpretation on 03/30/2016 at 11:03 am to Dr. Silverio Decamp , who  verbally acknowledged these results. IMPRESSION: 1. Right proximal M2 occlusion with distal reconstitution. There is completed infarct in the right caudate, putamen, internal capsule, and posterior insula. 2. No flow limiting stenosis or embolic source identified in the neck. Electronically Signed   By: Monte Fantasia M.D.   On: 03/30/2016 11:05   Mr Brain Wo Contrast  03/30/2016  CLINICAL DATA:  Left-sided facial weakness beginning at 4:30 a.m. today. Slurred speech. Stroke. EXAM: MRI HEAD WITHOUT CONTRAST TECHNIQUE: Multiplanar, multiecho pulse sequences of the brain and surrounding structures were obtained without intravenous contrast. COMPARISON:  CT head without contrast in CTA head and neck from the same day. FINDINGS: The diffusion-weighted images confirm acute/ subacute infarcts involving the right caudate head and anterior lentiform nucleus. Acute nonhemorrhagic infarct involves the posterior right lentiform nucleus is well is smaller portions of the right temporal cortex. Hemorrhagic conversion is noted within the right basal ganglia infarcts since the earlier CT scan. Edema and mass effect is  noted with the petechial hemorrhage and infarct. There is partial effacement of the right lateral ventricle. T2 changes are associated with the areas of acute infarct. Mild atrophy and moderate white matter disease is present in addition to the acute infarcts. The internal auditory canals are within normal limits bilaterally. The brainstem and cerebellum are normal. Flow is present in the major intracranial arteries. The right lens has been replaced. Globes and orbits are intact. The paranasal sinuses and mastoid air cells are clear. IMPRESSION: 1. Acute/subacute infarcts involve the right caudate head, lentiform nucleus, insular cortex, and portions of the right temporal lobe. 2. Moderate generalized atrophy and diffuse white matter disease likely reflects the sequela of chronic microvascular ischemia in  addition to the acute infarcts. These results were called by telephone at the time of interpretation on 03/30/2016 at 9:36 pm to Dr. Orlena Sheldon, who verbally acknowledged these results. Electronically Signed   By: San Morelle M.D.   On: 03/30/2016 21:37   MRI Aaron Edelman:  IMPRESSION: 1. Acute/subacute infarcts involve the right caudate head, lentiform nucleus, insular cortex, and portions of the right temporal lobe. 2. Moderate generalized atrophy and diffuse white matter disease likely reflects the sequela of chronic microvascular ischemia in addition to the acute infarcts.   2D Echo: Study Conclusions  - Left ventricle: The cavity size was normal. Wall thickness was  normal. Systolic function was moderately reduced. The estimated  ejection fraction was in the range of 35% to 40%. Diffuse  hypokinesis. Doppler parameters are consistent with high  ventricular filling pressure. - Aortic valve: There was trivial regurgitation. - Mitral valve: Calcified annulus. There was mild regurgitation. - Left atrium: The atrium was mildly dilated. - Right atrium: The atrium was mildly dilated. - Tricuspid valve: There was moderate regurgitation. - Pulmonary arteries: Systolic pressure was moderately increased.  PA peak pressure: 49 mm Hg (S).  Impressions:  - Technically difficult; definity used; moderate global LV  dysfunction (EF 49); trace AI; mild MR; mild biatrial  enlargement; moderate TR; moderately elevated pulmonary pressure.  Admission HPI: Ms. Norberg is a 75 year old woman with PMH of CAD, chronic systolic HF, Afib on coumadin, HLD and DM here with c/o left sided weakness and left facial droop since this AM. She lives with her son who witnessed the event; he is present and helps provide history. Was in usual state of health yesterday. Last seen normal around 8:30PM last night. When son came into living room around 4:30AM today she was seated at the computer but back was  to the computer. The left side of her face was drooping, speech was slurred and she was unable to get up from the chair when she tried. The patient says she did not feel any differently. She does report B/L tingling or her hands in the ED but she says she has experienced the same sensation in the past. She denies palpitations, lightheadedness, dizziness, numbness/tingling or vision changes during the event.   She denies hx of CVA, tobacco/EtOH/illicit drug use. She usually ambulates with a walker and her son has not noticed any unilateral weakness. She reports compliance with her medications, including coumadin. She gets her INR checked by PCP and says it is usually 2-3 but this past Tuesday the INR was below 2.   Hospital Course by problem list:  CVA Right MCA infarcts with hemorrhagic transformation: Patient initially presented with c/o left sided weakness and left facial droop. Patient with chronic A. Fib on warfarin. INR was sub-therapetuic (1.6) at  PCP's office 3 days prior to presentation. INR 1.6 on admission. MRI shows acute infarct of the right caudate, lentiform nucleus, insular cortex and portions of the right temporal lobe. Small hemorrhagic conversion within the right basal ganglia infarcts. Repeat head CT showed stable hemorrhagic area. 2D ECHO with mildly worse EF 35-40% from prior 40-45%. No evidence of clots. She had 4/5 strength in her LUE and LLE with intact sensation through out. Initially with left lower facial droop that resolved during admission. Given her hemorrhagic transformation, her warfarin was held. Per neuro recs holding warfarin for 7 days with repeat head CT in 7 days (5/27) to assess for any further hemorrhagic change. If stable/improved will restart anticoagulation at that time with Eliquis and ASA. CVA felt to be secondary to emboli from her chronic A. Fib with sub-therapeutic INR. Started high-intensity statin, Lipitor 40 mg daily.  Resumed her home  antihypertensives, coreg 6.25 mg and losartan 12.5 mg daily (decreased from 25 mg daily home dose due to low BP). SBP goal <160 with her hemorrhagic changes.  PT recommended CIR.   UTI with nitrite + UA on admission: RN noted some foul smell. Patient denies any urinary complaints. UA on admission with +nitirte and bacteriuria. However, since she is elderly and would be at high risk of delirium if she did have worsening UTI, we will treat her for 3 days of ceftriaxone. Ceftriaxone 5/21 >> 5/23.   Peramanent fibrillation on coumadin PTA: Rate controlled on BB. CHADSVASc score is 6 and she is on coumadin for A/C. INR was subtherapeutic at PCP earlier this week and this admission is 1.59. Resumed bblocker. Holding A/C as above.  Diabetes: Reports compliance with Lantus 32 units qHS. A1c 8.1. CBGs well controlled on Lantus 18 units qHs and SSI. Resume home medications at discharge.   HTN: SBP 120-130s. Restarted Carvedilol 6.25 mg bid and lasix 40 mg daily and lostartan 12.5 mg daily (25 mg at home). Goal SBP <160 with her hemorrhagic changes.   Hyperlipidemia: She reports compliance with simvastatin 40mg . Lipid panel reveals LDL 53, HDL 39, TG 50, TChol 102. Started Lipitor 40 mg qhs for high-intensive therapy.   Coronary atherosclerosis: Stable. She has not needed to use SL NTG recently. EKG technically meets criteria for LBBB with broad R in I, aVL, V6 (mildly prolonged QRS ~ 125) but morphology otherwise similar to prior EKG and she is asymptomatic w/ negative troponin so I do not suspect underlying ischemia. Hold ASA as above.  Chronic systolic heart failure: Stable. EF 40-45% February 2014. EF 35-40% on ECHO here. Her cardiologist is Dr. Angelena Form and she was last seen in Dec 2016. She reports stable symptoms. She is compliant with her med regimen including BB and ARB. Resumed ARB and BBLocker. Decreased lasix 60mg  > 40 mg daily.  CKD3: Stable. Per Care Everywhere record, her  baseline Cr appears to be 1.2-1.4. She is at baseline currently. Her nephrologist is Dr[facility] Audie [facility].   Bilateral hand tingling: Patient reports symptoms for the past 2-3 months. Negative tinel's sign and Phalen's sign today. Intact sensation. Would be unusual for DM neuropathy or B12 def to present in UE initially with no LE symptoms. She does have a history of thoracic spine pain with chronic changes. Possible that she has C-spine changes that are causing the symptoms. Would recommend EMG testing done out patient.   Discharge Vitals:   BP 110/40 mmHg  Pulse 64  Temp(Src) 98.4 F (36.9 C) (Oral)  Resp 18  Ht 5\' 8"  (1.727 m)  Wt 300 lb (136.079 kg)  BMI 45.63 kg/m2  SpO2 94%  Discharge Labs:  Results for orders placed or performed during the hospital encounter of 03/30/16 (from the past 24 hour(s))  Glucose, capillary     Status: Abnormal   Collection Time: 04/02/16  4:16 PM  Result Value Ref Range   Glucose-Capillary 173 (H) 65 - 99 mg/dL  Glucose, capillary     Status: Abnormal   Collection Time: 04/02/16 10:08 PM  Result Value Ref Range   Glucose-Capillary 170 (H) 65 - 99 mg/dL   Comment 1 Notify RN    Comment 2 Document in Chart   Basic metabolic panel     Status: Abnormal   Collection Time: 04/03/16  5:29 AM  Result Value Ref Range   Sodium 134 (L) 135 - 145 mmol/L   Potassium 3.8 3.5 - 5.1 mmol/L   Chloride 101 101 - 111 mmol/L   CO2 27 22 - 32 mmol/L   Glucose, Bld 119 (H) 65 - 99 mg/dL   BUN 31 (H) 6 - 20 mg/dL   Creatinine, Ser 1.34 (H) 0.44 - 1.00 mg/dL   Calcium 8.5 (L) 8.9 - 10.3 mg/dL   GFR calc non Af Amer 37 (L) >60 mL/min   GFR calc Af Amer 43 (L) >60 mL/min   Anion gap 6 5 - 15  Glucose, capillary     Status: Abnormal   Collection Time: 04/03/16  6:31 AM  Result Value Ref Range   Glucose-Capillary 115 (H) 65 - 99 mg/dL   Comment 1 Notify RN    Comment 2 Document in Chart   Glucose, capillary     Status: Abnormal   Collection Time: 04/03/16  11:24 AM  Result Value Ref Range   Glucose-Capillary 147 (H) 65 - 99 mg/dL    Signed: Maryellen Pile, MD 04/03/2016, 12:04 PM

## 2016-04-02 NOTE — Progress Notes (Signed)
Physical Therapy Treatment Patient Details Name: [name] MRN: [id number] DOB: March 02, 1941 Today's Date: 04/02/2016    History of Present Illness Patient admitted with sudden onset left-sided weakness and left facial droop.  Work up reveals CVA: right MCA infarcts with hemorrhagic transformation: MRI shows acute infarct of the right caudate, lentiform nucleus, insular cortex and portions of the right temporal lobe. Small hemorrhagic conversion within the right basal ganglia infarcts.  She has chronic A.Fib with sub-therapeutic INR. CVA likely secondary to emboli from her a. Fib. Significant PMH also includes DM, s/p CABG 2009, and obesity.    PT Comments    Patient progressing slowly towards PT goals. Requires Mod-Max A to stand from all surfaces with cues for technique. Demonstrates left knee instability during gait training but no knee buckling today. Eager to go to CIR. Will follow acutely.   Follow Up Recommendations  CIR     Equipment Recommendations  None recommended by PT    Recommendations for Other Services       Precautions / Restrictions Precautions Precautions: Fall Precaution Comments: left knee weakness Restrictions Weight Bearing Restrictions: No    Mobility  Bed Mobility Overal bed mobility: Needs Assistance Bed Mobility: Supine to Sit     Supine to sit: Mod assist;+2 for physical assistance;HOB elevated     General bed mobility comments: Assist with scooting bottom, to elevate trunk and move LLE to EOB. Increased time and cues.   Transfers Overall transfer level: Needs assistance Equipment used: Rolling walker (2 wheeled) Transfers: Sit to/from Stand Sit to Stand: Mod assist;Max assist         General transfer comment: Mod-Max A to stand from EOB x1, from toilet x2. Cues for hand placement/technique and use of forward momentum. transferred to chair post ambulation bout.  Ambulation/Gait Ambulation/Gait assistance: Min assist;+2 physical  assistance Ambulation Distance (Feet): 12 Feet (x2 bouts) Assistive device: Rolling walker (2 wheeled) (bari RW) Gait Pattern/deviations: Step-to pattern;Decreased stance time - left;Decreased step length - right;Trunk flexed;Wide base of support Gait velocity: decreased   General Gait Details: Cues for RW proximity/management. Some stability needed for left knee during stance phase but no knee buckling noted.    Stairs            Wheelchair Mobility    Modified Rankin (Stroke Patients Only) Modified Rankin (Stroke Patients Only) Pre-Morbid Rankin Score: Moderate disability Modified Rankin: Moderately severe disability     Balance Overall balance assessment: Needs assistance Sitting-balance support: Feet supported;Bilateral upper extremity supported Sitting balance-Leahy Scale: Fair     Standing balance support: During functional activity Standing balance-Leahy Scale: Poor Standing balance comment: Reliant on BUE for support. Total A for pericare in standing.                     Cognition Arousal/Alertness: Awake/alert Behavior During Therapy: WFL for tasks assessed/performed Overall Cognitive Status: Within Functional Limits for tasks assessed                      Exercises      General Comments        Pertinent Vitals/Pain Pain Assessment: Faces Faces Pain Scale: Hurts little more Pain Location: back Pain Descriptors / Indicators: Sore Pain Intervention(s): Monitored during session;Repositioned    Home Living                      Prior Function  PT Goals (current goals can now be found in the care plan section) Progress towards PT goals: Progressing toward goals    Frequency  Min 4X/week    PT Plan Current plan remains appropriate    Co-evaluation             End of Session Equipment Utilized During Treatment: Gait belt Activity Tolerance: Patient tolerated treatment well Patient left: in chair;with  call bell/phone within reach;with family/visitor present     Time: 1126-1200 PT Time Calculation (min) (ACUTE ONLY): 34 min  Charges:  $Gait Training: 8-22 mins $Therapeutic Activity: 8-22 mins                    G Codes:      Simmie Garin A Montrice Gracey 04/02/2016, 12:10 PM Mylo Red, PT, DPT [phone]

## 2016-04-02 NOTE — Clinical Social Work Note (Signed)
Clinical Social Work Assessment  Patient Details  Name: [name] MRN: [id number] Date of Birth: [birth year]  Date of referral:  04/02/16               Reason for consult:  Facility Placement                Permission sought to share information with:  Facility Sport and exercise psychologist, Family Supports Permission granted to share information::  Yes, Verbal Permission Granted  Name::     [name]  Agency::  [facility]  Relationship::  Son  Contact Information:  [phone]  Housing/Transportation Living arrangements for the past 2 months:  [place] of Information:  Patient, Adult Children Patient Interpreter Needed:  None Criminal Activity/Legal Involvement Pertinent to Current Situation/Hospitalization:  No - Comment as needed Significant Relationships:  Adult Children Lives with:  Self Do you feel safe going back to the place where you live?  No Need for family participation in patient care:  Yes (Comment)  Care giving concerns:  CSW received referral for possible SNF placement at time of discharge if CIR is unable to admit. CSW met with patient and patient's son at bedside regarding PT recommendation of SNF placement at time of discharge. Per patient's son, patient is currently unable to care for herself at home given patient's current physical needs and fall risk. Patient and patient's son expressed understanding of PT recommendation and are agreeable to SNF placement at time of discharge. CSW to continue to follow and assist with discharge planning needs.   Social Worker assessment / plan:  CSW spoke with patient and patient's son concerning possibility of rehab at [facility] before returning home if CIR is unable to admit.  Employment status:  Retired Nurse, adult PT Recommendations:  [place], [place] / Referral to community resources:  [place]  Patient/Family's  Response to care:  Patient and patient's son recognize need for rehab before returning home and are agreeable to a SNF in [place]. Patient and patient's son are hopeful to be admitted to CIR.  Patient/Family's Understanding of and Emotional Response to Diagnosis, Current Treatment, and Prognosis:  Patient is realistic regarding therapy needs. No questions/concerns about plan or treatment.    Emotional Assessment Appearance:  Appears stated age Attitude/Demeanor/Rapport:   (Appropriate) Affect (typically observed):  Accepting, Adaptable, Appropriate, Pleasant Orientation:  Oriented to Self, Oriented to Place, Oriented to  Time, Oriented to Situation Alcohol / Substance use:  Not Applicable Psych involvement (Current and /or in the community):  No (Comment)  Discharge Needs  Concerns to be addressed:  Care Coordination Readmission within the last 30 days:  No Current discharge risk:  None Barriers to Discharge:  Continued Medical Work up   [employer], [place] 04/02/2016, 6:04 PM

## 2016-04-02 NOTE — Care Management Note (Signed)
Case Management Note  Patient Details  Name: [name] MRN: [id number] Date of Birth: [birth year]  Subjective/Objective:   Pt admitted with CVA. She is from home with family.                  Action/Plan: Rec is for CIR. CM following for d/c needs.   Expected Discharge Date:                  Expected Discharge Plan:  [place]  In-House Referral:     Discharge planning Services     Post Acute Care Choice:    Choice offered to:     DME Arranged:    DME Agency:     HH Arranged:    [place] Agency:     Status of Service:  In process, will continue to follow  Medicare Important Message Given:    Date Medicare IM Given:    Medicare IM give by:    Date Additional Medicare IM Given:    Additional Medicare Important Message give by:     If discussed at [place] of Stay Meetings, dates discussed:    Additional Comments:  Pollie Friar, RN 04/02/2016, 10:58 AM

## 2016-04-02 NOTE — Consult Note (Signed)
Physical Medicine and Rehabilitation Consult Reason for Consult: Right MCA infarct with hemorrhagic transformation Referring Physician: [facility]   HPI: [name] is a 75 y.o. right handed female with history of CAD status post CABG, ischemic cardiomyopathy, hypertension, diastolic congestive heart failure, diabetes mellitus peripheral neuropathy, atrial fibrillation maintained on chronic Coumadin. Per chart review patient lives with son. Ambulating short distances prior to admission using a rolling walker. One level home. Son is only available in the evenings, but grand daughter may be available during the day. Presented 03/30/2016 with sudden onset of left-sided weakness and facial droop with slurred speech. Cranial CT scan showed focal hypodensity within the right basal ganglia highly suspicious for evolving ischemia. Possible extension into the right subinsular region. Remote pontine infarct. Patient did not receive TPA. INR on admission of 1.59. CT angiogram head and neck showed right proximal M2 occlusion with distal reconstitution. There was completed infarct in the right caudate, putamen, internal capsule and posterior insula. Echocardiogram with ejection fraction of 40% diffuse hypokinesis. MRI/CT 03/31/2016 of the brain showed right MCA with hemorrhagic transformation felt to be embolic secondary to atrial fibrillation. Neurology consulted with workup ongoing. Urine study positive nitrite currently maintained on Rocephin. Tolerating a regular consistency diet.   Review of Systems  Constitutional: Negative for fever and chills.  HENT: Negative for hearing loss.   Eyes: Negative for blurred vision and double vision.  Respiratory: Negative for cough.        Increased shortness of breath with exertion  Cardiovascular: Positive for palpitations and leg swelling. Negative for chest pain.  Gastrointestinal: Positive for constipation. Negative for nausea and vomiting.  Genitourinary:  Positive for urgency. Negative for dysuria and hematuria.  Musculoskeletal: Positive for myalgias and back pain.  Skin: Negative for rash.  Neurological: Positive for weakness. Negative for seizures and headaches.  All other systems reviewed and are negative.  Past Medical History  Diagnosis Date  . CAD (coronary artery disease) of artery bypass graft 05/2008    S/P CABG in 2009  . Ischemic cardiomyopathy   . Systolic heart failure   . Atrial fibrillation ([place])     Postoperative in 2009;  S/P transesophageal echocardiography-guided cardioversion, previously on Amiodarone and Coumadin; recurrent in April 2013  . Hyperlipidemia   . Chronic anticoagulation   . Obesities, morbid ([place])   . Hypertension   . Heart murmur   . CHF (congestive heart failure) ([place])   . Type II diabetes mellitus ([place])   . TIA (transient ischemic attack) 03/30/2016  . Arthritis     "qwhere" (03/30/2016)  . Chronic lower back pain   . Chronic kidney disease (CKD), stage III (moderate)    Past Surgical History  Procedure Laterality Date  . Ventral hernia repair  10/1999    With mesh  . Total knee arthroplasty Right 1994  . Appendectomy  1946  . Joint replacement    . Hernia repair    . Cataract extraction Right 2012  . Coronary angioplasty with stent placement  2009    "put 2 stents in"  . Tee with cardioversion      Postoperative in 2009;  S/P transesophageal echocardiography-guided cardioversion,   Family History  Problem Relation Age of Onset  . Clotting disorder Mother     Cerebral hemorrhage  . Emphysema Father     COD  . Lung cancer Brother   . Heart disease Neg Hx    Social History:  reports that she has never smoked.  She has never used smokeless tobacco. She reports that she does not drink alcohol or use illicit drugs. Allergies:  Allergies  Allergen Reactions  . Benazepril Other (See Comments)  . Fe-Succ-C-Thre-B12-Des Stomach Other (See Comments)    Pt doesn't remember   .  Spironolactone Other (See Comments)    Dizziness   . Sulfamethoxazole-Trimethoprim Other (See Comments)    Pt doesn't remember   Medications Prior to Admission  Medication Sig Dispense Refill  . aspirin EC 81 MG tablet Take 81 mg by mouth daily.    . carvedilol (COREG) 6.25 MG tablet Take 1 tablet (6.25 mg total) by mouth 2 (two) times daily with a meal. 60 tablet 6  . cholecalciferol (VITAMIN D) 1000 UNITS tablet Take 1,000 Units by mouth 2 (two) times daily.    Marland Kitchen docusate sodium (COLACE) 100 MG capsule Take 100 mg by mouth 2 (two) times daily.     . furosemide (LASIX) 40 MG tablet Take 60 mg by mouth daily.     Marland Kitchen gabapentin (NEURONTIN) 400 MG capsule Take 400 mg by mouth at bedtime.    . insulin glargine (LANTUS) 100 UNIT/ML injection Inject 32 Units into the skin at bedtime.     Marland Kitchen losartan (COZAAR) 25 MG tablet Take 25 mg by mouth daily.      . nitroGLYCERIN (NITROSTAT) 0.4 MG SL tablet Place 0.4 mg under the tongue every 5 (five) minutes as needed. May repeat for up to 3 doses.     . potassium chloride SA (K-DUR,KLOR-CON) 20 MEQ tablet Take 20 mEq by mouth daily.      Marland Kitchen rOPINIRole (REQUIP) 0.5 MG tablet Take 1 mg by mouth at bedtime.    . simvastatin (ZOCOR) 40 MG tablet Take 40 mg by mouth at bedtime.      . traMADol (ULTRAM) 50 MG tablet Take 50-100 mg by mouth 2 (two) times daily. Take 2 tablets every morning and take 1 tablet at night    . vitamin B-12 (CYANOCOBALAMIN) 1000 MCG tablet Take 1,000 mcg by mouth daily.    Marland Kitchen warfarin (COUMADIN) 1 MG tablet Take 1 mg by mouth daily.    Marland Kitchen warfarin (COUMADIN) 5 MG tablet Take 5 mg by mouth daily. As prescribed by coumadin clinic      Home: Home Living Family/patient expects to be discharged to:: Inpatient rehab Living Arrangements: Children Available Help at Discharge: Family (son only available in evenings) Type of Home: House Home Layout: One level Home Equipment: Environmental consultant - 4 wheels  Lives With: Son  Functional History: Prior  Function Level of Independence: Needs assistance Gait / Transfers Assistance Needed: per son and patient could ambulate and transfer independently, used rollator for [employer] ADL's / Homemaking Assistance Needed: Aide in 2x/week to assist with bathing Functional Status:  Mobility: Bed Mobility Overal bed mobility: Needs Assistance, +2 for physical assistance Bed Mobility: Supine to Sit Supine to sit: Mod assist, +2 for physical assistance General bed mobility comments: mod cues for hand placement and sequencing, raised HOB Transfers Overall transfer level: Needs assistance Equipment used: 4-wheeled walker Transfers: Sit to/from Stand Sit to Stand: Mod assist, +2 physical assistance Stand pivot transfers: Min assist, +2 physical assistance General transfer comment: transfer from bed and from [facility] over toilet in bathroom, mod cues for hand placement Ambulation/Gait Ambulation/Gait assistance: Mod assist, +2 physical assistance Ambulation Distance (Feet): 6 Feet (to bathroom, 8' into room from bathroom) Assistive device: 4-wheeled walker (bariatric rollator) Gait Pattern/deviations: Step-to pattern, Decreased step length - right,  Decreased stance time - right, Shuffle, Trunk flexed, Wide base of support General Gait Details: poor walker placement, needs mod cues and assist to maintain control of rollator (placed too far anteriorly). Assist to advance Rt leg during gait. 2 episodes of Rt knee buckling in stance phase    ADL: ADL Overall ADL's : Needs assistance/impaired Eating/Feeding: Independent, Sitting Grooming: Set up, Sitting, Supervision/safety Upper Body Bathing: Minimal assitance, Sitting Lower Body Bathing: Maximal assistance, Sit to/from stand Upper Body Dressing : Minimal assistance, Sitting Lower Body Dressing: Maximal assistance, Sit to/from stand Lower Body Dressing Details (indicate cue type and reason): Pt unable to reach feet. Required assist to start underwear over  feet and assist to pull up once in standing. Toilet Transfer: Moderate assistance, +2 for physical assistance, Ambulation, BSC, RW (BSC over toilet) Toileting- Clothing Manipulation and Hygiene: Moderate assistance, Sit to/from stand, +2 for physical assistance Toileting - Clothing Manipulation Details (indicate cue type and reason): +2 for sit to stand. Min guard for toilet hygiene in sitting. Mod assist for clothing Functional mobility during ADLs: Moderate assistance, +2 for physical assistance, Rolling walker General ADL Comments: Attempted mobility with bari rollator since pt uses one at home; feel pt would be safer with bari RW.  Cognition: Cognition Overall Cognitive Status: Within Functional Limits for tasks assessed Orientation Level: Oriented X4 Comments: 26/30 on MoCA Cognition Arousal/Alertness: Awake/alert Behavior During Therapy: WFL for tasks assessed/performed Overall Cognitive Status: Within Functional Limits for tasks assessed  Blood pressure 134/62, pulse 69, temperature 98.8 F (37.1 C), temperature source Oral, resp. rate 22, height 5\' 8"  (1.727 m), weight 136.079 kg (300 lb), SpO2 98 %. Physical Exam  Vitals reviewed. Constitutional: She is oriented to person, place, and time. She appears well-developed.  75 year old right-handed obese female  HENT:  Head: Normocephalic and atraumatic.  Eyes: Conjunctivae and EOM are normal.  Neck: Normal range of motion. Neck supple. No thyromegaly present.  Cardiovascular:  Irregulary irregular  Respiratory: Effort normal and breath sounds normal. No respiratory distress.  GI: Soft. Bowel sounds are normal. She exhibits no distension.  Musculoskeletal: She exhibits edema. She exhibits no tenderness.  Neurological: She is alert and oriented to person, place, and time.  Follows commands.  Fair awareness of deficits. DTRs symmetric Sensation intact to light touch ?Mild right facial weakness Motor: RUE/RLE: 5/5 proximal to  distal LUE: 4+/5 proximal to distal LLE: 4-4-/5 proximally, 4+/5 distally  Skin: Skin is warm and dry.  Psychiatric: She has a normal mood and affect. Her behavior is normal.    Results for orders placed or performed during the hospital encounter of 03/30/16 (from the past 24 hour(s))  Glucose, capillary     Status: Abnormal   Collection Time: 04/01/16  6:41 AM  Result Value Ref Range   Glucose-Capillary 128 (H) 65 - 99 mg/dL  Glucose, capillary     Status: Abnormal   Collection Time: 04/01/16 11:08 AM  Result Value Ref Range   Glucose-Capillary 216 (H) 65 - 99 mg/dL  Glucose, capillary     Status: Abnormal   Collection Time: 04/01/16  4:36 PM  Result Value Ref Range   Glucose-Capillary 221 (H) 65 - 99 mg/dL  Glucose, capillary     Status: Abnormal   Collection Time: 04/01/16 10:01 PM  Result Value Ref Range   Glucose-Capillary 188 (H) 65 - 99 mg/dL  Basic metabolic panel     Status: Abnormal   Collection Time: 04/02/16  4:05 AM  Result Value Ref Range  Sodium 136 135 - 145 mmol/L   Potassium 3.8 3.5 - 5.1 mmol/L   Chloride 103 101 - 111 mmol/L   CO2 26 22 - 32 mmol/L   Glucose, Bld 135 (H) 65 - 99 mg/dL   BUN 25 (H) 6 - 20 mg/dL   Creatinine, Ser 1.23 (H) 0.44 - 1.00 mg/dL   Calcium 8.7 (L) 8.9 - 10.3 mg/dL   GFR calc non Af Amer 41 (L) >60 mL/min   GFR calc Af Amer 48 (L) >60 mL/min   Anion gap 7 5 - 15   Ct Head Wo Contrast  03/31/2016  CLINICAL DATA:  Hemorrhagic infarct. EXAM: CT HEAD WITHOUT CONTRAST TECHNIQUE: Contiguous axial images were obtained from the base of the skull through the vertex without intravenous contrast. COMPARISON:  MRI brain 03/30/2016.  CT head 03/30/2016. FINDINGS: Hemorrhagic infarcts involving the right lentiform nucleus and caudate head are stable. There is continued evolution of nonhemorrhagic infarcts involving the posterior right lentiform nucleus and temporal lobe. No new areas of acute infarct are present. There is no expansion of  hemorrhage within the right basal ganglia. The left basal ganglia are intact. Minimal mucosal thickening is present in the left maxillary sinus. The remaining paranasal sinuses and the mastoid air cells are clear. Dense calcifications are present within the cavernous internal carotid arteries bilaterally. The calvarium is intact. No significant extracranial soft tissue lesions are present. IMPRESSION: 1. Stable appearance of hemorrhagic infarcts involving the right lentiform nucleus and caudate head without expansion. 2. Expected evolution of nonhemorrhagic infarcts involving the posterior right insular cortex and temporal lobe. 3. Stable white matter disease without other acute infarct. Electronically Signed   By: San Morelle M.D.   On: 03/31/2016 21:34    Assessment/Plan: Diagnosis: Right MCA infarct with hemorrhagic transformation Labs and images independently reviewed.  Records reviewed and summated above. Stroke: Continue secondary stroke prophylaxis and Risk Factor Modification listed below:   Blood Pressure Management:  Continue current medication with prn's with permisive HTN per primary team Diabetes management:   Left sided hemiparesis:    1. Does the need for close, 24 hr/day medical supervision in concert with the patient's rehab needs make it unreasonable for this patient to be served in a less intensive setting? Yes  2. Co-Morbidities requiring supervision/potential complications: CAD status post CABG (Monitor in accordance with increased physical activity and avoid UE resistance excercises), combined ischemic cardiomyopathy (See previous), HTN (monitor and provide prns in accordance with increased physical exertion and pain), diabetes mellitus peripheral neuropathy (Monitor in accordance with exercise and adjust meds as necessary), atrial fibrillation (Cont meds, monitor with increased physical activity), tachypnea (monitor RR and O2 Sats with increased physical exertion), CKD  (avoid nephrotoxic meds), Thrombocytopenia (< 60,000/mm3 no resistive exercise, monitor for antibodies, cont to follow) 3. Due to safety, skin/wound care, disease management and patient education, does the patient require 24 hr/day rehab nursing? Yes 4. Does the patient require coordinated care of a physician, rehab nurse, PT (1-2 hrs/day, 5 days/week) and OT (1-2 hrs/day, 5 days/week) to address physical and functional deficits in the context of the above medical diagnosis(es)? Yes Addressing deficits in the following areas: balance, endurance, locomotion, strength, transferring, bathing, dressing, toileting and psychosocial support 5. Can the patient actively participate in an intensive therapy program of at least 3 hrs of therapy per day at least 5 days per week? Yes 6. The potential for patient to make measurable gains while on inpatient rehab is excellent 7. Anticipated functional outcomes  upon discharge from inpatient rehab are supervision and min assist  with PT, supervision and min assist with OT, n/a with SLP. 8. Estimated rehab length of stay to reach the above functional goals is: 16-19 days. 9. Does the patient have adequate social supports and living environment to accommodate these discharge functional goals? Potentially 10. Anticipated D/C setting: Home 11. Anticipated post D/C treatments: HH therapy and Home excercise program 12. Overall Rehab/Functional Prognosis: good  RECOMMENDATIONS: This patient's condition is appropriate for continued rehabilitative care in the following setting: CIR Patient has agreed to participate in recommended program. Yes Note that insurance prior authorization may be required for reimbursement for recommended care.  Comment: Rehab Admissions Coordinator to follow up.  Delice Lesch, MD 04/02/2016

## 2016-04-02 NOTE — Progress Notes (Signed)
  Date: 04/02/2016  Patient name: [name]  Medical record number: [id number]  Date of birth: 1941-02-02   This patient has been seen and the plan of care was discussed with the house staff. Please see Dr. Shanna Cisco note for complete details. I concur with his findings.  She is actually doing very well from a strength standpoint.  She will be an excellent CIR candidate and could improve significantly.   Sid Falcon, MD 04/02/2016, 2:40 PM

## 2016-04-02 NOTE — Progress Notes (Signed)
Rehab admissions - Please see consult done by Dr. Posey Pronto today.  I have called and opened the case with [employer].  Will await call back from insurance case manager regarding potential acute inpatient rehab admission.  Call me for questions.  [phone]

## 2016-04-02 NOTE — Progress Notes (Signed)
Subjective: No events overnight. Repeat Head CT showed stable hemorrhagic infarcts on the right sided. Continues to have some foul urine smell. Denies any urinary complaints. Reports being incontinent at baseline. Afebrile overnight, Patient reports no change in her left sided weakness.   Objective: Vital signs in last 24 hours: Filed Vitals:   04/01/16 1402 04/01/16 1725 04/01/16 2100 04/02/16 0138  BP: 109/69 104/43 126/48 134/62  Pulse: 77 67 75 69  Temp: 98.7 F (37.1 C) 99.6 F (37.6 C) 99 F (37.2 C) 98.8 F (37.1 C)  TempSrc: Oral Oral Oral Oral  Resp: 18 18 20 22   Height:      Weight:      SpO2: 97% 95% 97% 98%   Weight change:  No intake or output data in the 24 hours ending 04/02/16 1051   General: resting in bed in NAD HEENT: PERRL, EOMI, no scleral icterus, left lower facial droop resolved Cardiac: irregular distant HS, no rubs, murmurs or gallops Pulm: clear to auscultation bilaterally, no w/r/r, moving normal volumes of air Abd: soft, obese, nontender, nondistended, BS present Ext: warm and well perfused, 1+ B/L pedal edema, chronic venous skin changes Neuro: alert and oriented X3, cranial nerves II-XII grossly intact, 5/5 MMS RUE and RLE, 4+/5 LUE and LLE, sensation grossly equal and intact, normal Babinski  Medications: I have reviewed the patient's current medications. Scheduled Meds: . atorvastatin  40 mg Oral q1800  . carvedilol  6.25 mg Oral BID WC  . cefTRIAXone (ROCEPHIN)  IV  1 g Intravenous Q24H  . docusate sodium  100 mg Oral BID  . furosemide  60 mg Oral Daily  . gabapentin  400 mg Oral QHS  . insulin aspart  0-9 Units Subcutaneous TID WC  . insulin glargine  18 Units Subcutaneous QHS  . sodium chloride flush  3 mL Intravenous Q12H  . traMADol  100 mg Oral Daily  . traMADol  50 mg Oral QHS   Continuous Infusions: . sodium chloride Stopped (03/30/16 1625)  . sodium chloride 100 mL/hr at 03/31/16 1848   PRN Meds:.sodium chloride  flush Assessment/Plan:  CVA: right MCA infarcts with hemorrhagic transformation: MRI shows acute infarct of the right caudate, lentiform nucleus, insular cortex and portions of the right temporal lobe. Small hemorrhagic conversion within the right basal ganglia infarcts. Repeat head CT showed stable hemorrhagic area.   2D ECHO with mildly worse EF 35-40% from prior 40-45%. No evidence of clots Neuro exam today with no change in her symptoms though does have improved left lower facial droop. She has chronic A.Fib with sub-therapeutic INR. CVA likely secondary to emboli from her a. Fib.   -Out of permissive HTN window, will resume her BP meds slowly. BP stable today. -Will continue to hold ASA and also anticoag for now. Plan to repeat CT head in a week and consider starting DOAC vs Warfarin at that time if no further hemorrhagic changes.  -Started Lipitor 40 mg daily -telemetry monitoring -PT/OT recommended CIR  UTI with nitrite + UA on admission - RN noted some foul smell, Denies any urinary complaints. However, since she is elderly and would be at high risk of delirium if she did have worsening UTI, we will treat her for 3 days of ceftriaxone.  - ceftriaxone 5/21 >> 5/23.   Peramanent fibrillation on coumadin PTA: Rate controlled on BB. CHADSVASc score is 6 and she is on coumadin for A/C. INR was subtherapeutic at PCP earlier this week and this admission is 1.59.  -  resumed bblocker as now out of permissive HTN window - hold A/C as above  Diabetes: Reports compliance with Lantus 32 units qHS. A1c 8.1. CBG 119 this morning.  - Continue Lantus 18 units qHs while inpatient to avoid hypoglycemia - SSI  HTN: SBP 120-130s. Currently on Carvedilol 6.25 mg bid and lasix 60 mg daily.  -Will restart lostartan 12.5 mg daily (25 mg at home) -Check BMET in am  Hyperlipidemia: She reports compliance with simvastatin 40mg . Lipid panel  reveals LDL 53, HDL 39, TG 50, TChol 102.  -Started  Lipitor 40 mg qhs  Coronary atherosclerosis: Stable. She has not needed to use SL NTG recently. EKG technically meets criteria for LBBB with broad R in I, aVL, V6 (mildly prolonged QRS ~ 125) but morphology otherwise similar to prior EKG and she is asymptomatic w/ negative troponin so I do not suspect underlying ischemia. - hold ASA as above  Chronic systolic heart failure: Stable. EF 40-45% February 2014. EF 35-40% on ECHO here. Her cardiologist is Dr. Angelena Form and she was last seen in Dec 2016. She reports stable symptoms. She is compliant with her med regimen including BB and ARB.  - resume ARB today. Resumed BBLocker. Decrease lasix 60mg  > 40 mg daily.  CKD3: Stable. Per Care Everywhere record, her baseline Cr appears to be 1.2-1.4. She is at baseline currently. Her nephrologist is [facility] Audie [facility] at [facility].   Diet:  carb modified diet  VTE ppx: SCDs.  Code status: Full  Dispo: Discharge to CIR vs SNF pending bed availability   The patient does have a current PCP (Daphene Jaeger, PA-C) and does need an [facility] hospital follow-up appointment after discharge.  The patient does not have transportation limitations that hinder transportation to clinic appointments.  .Services Needed at time of discharge: Y = Yes, Blank = No PT:   OT:   RN:   Equipment:   Other:     LOS: 2 days   Maryellen Pile, MD  04/02/2016, 10:51 AM

## 2016-04-02 NOTE — NC FL2 (Signed)
[place] LEVEL OF CARE SCREENING TOOL     IDENTIFICATION  Patient Name: [name] Birthdate: 01-23-1941 Sex: female Admission Date (Current Location): 03/30/2016  [facility] and [place] Number:  [employer] and Address:  [facility] [place] [place] [address], [place], [place] [address]      Provider Number: [id number]  Attending Physician Name and Address:  Axel Filler, MD  Relative Name and Phone Number:  Broadus John, son, [phone]    Current Level of Care: Hospital Recommended Level of Care: [place] Prior Approval Number:    Date Approved/Denied:   PASRR Number: [id number]  Discharge Plan: SNF    Current Diagnoses: Patient Active Problem List   Diagnosis Date Noted  . UTI (urinary tract infection) 04/01/2016  . Acute CVA (cerebrovascular accident) ([place]) 03/31/2016  . Acute hemorrhagic infarction of brain ([place])   . Cerebrovascular accident (CVA) due to embolism of right middle cerebral artery ([place])   . Subtherapeutic international normalized ratio (INR)   . CVA (cerebral vascular accident) ([place]) 03/30/2016  . CKD (chronic kidney disease) stage 3, GFR 30-59 ml/min 03/30/2016  . Chronic anticoagulation 03/30/2016  . Diabetes ([place]) 01/31/2009  . Hyperlipidemia 01/31/2009  . ANXIETY 01/31/2009  . Coronary atherosclerosis 01/31/2009  . CAD, AUTOLOGOUS BYPASS GRAFT 01/31/2009  . CARDIOMYOPATHY, ISCHEMIC 01/31/2009  . ATRIAL FIBRILLATION 01/31/2009  . SYSTOLIC HEART FAILURE, CHRONIC 01/31/2009  . TACHYCARDIA, HX OF 01/31/2009    Orientation RESPIRATION BLADDER Height & Weight     Self, Time, Situation, Place  Normal Incontinent Weight: 136.079 kg (300 lb) Height:  5\' 8"  (172.7 cm)  BEHAVIORAL SYMPTOMS/MOOD NEUROLOGICAL BOWEL NUTRITION STATUS      Continent Diet (Please see DC summary)  AMBULATORY STATUS COMMUNICATION OF NEEDS Skin   Extensive Assist (300 Lbs) Verbally Normal                     Personal Care Assistance Level of Assistance  Bathing, Feeding, Dressing Bathing Assistance: Maximum assistance Feeding assistance: Independent Dressing Assistance: Limited assistance     Functional Limitations Info             SPECIAL CARE FACTORS FREQUENCY  PT (By licensed PT), OT (By licensed OT)     PT Frequency: min 4x/week OT Frequency: min 2x/week            Contractures      Additional Factors Info  Code Status, Allergies, Insulin Sliding Scale Code Status Info: Full Allergies Info: Benazepril, Fe-succ-c-thre-b12-des Stomach, Spironolactone, Sulfamethoxazole-trimethoprim   Insulin Sliding Scale Info: insulin aspart (novoLOG) injection 0-9 Units;insulin glargine (LANTUS) injection 18 Units;       Current Medications (04/02/2016):  This is the current hospital active medication list Current Facility-Administered Medications  Medication Dose Route Frequency Provider Last Rate Last Dose  . 0.9 %  sodium chloride infusion   Intravenous Continuous Ram Fuller Mandril, MD   Stopped at 03/30/16 1625  . 0.9 %  sodium chloride infusion   Intravenous Continuous Ram Fuller Mandril, MD 100 mL/hr at 03/31/16 1848    . atorvastatin (LIPITOR) tablet 40 mg  40 mg Oral q1800 Maryellen Pile, MD   40 mg at 04/01/16 1726  . carvedilol (COREG) tablet 6.25 mg  6.25 mg Oral BID WC Tasrif Ahmed, MD   6.25 mg at 04/01/16 1726  . cefTRIAXone (ROCEPHIN) 1 g in dextrose 5 % 50 mL IVPB  1 g Intravenous Q24H Dellia Nims, MD  1 g at 04/01/16 1538  . docusate sodium (COLACE) capsule 100 mg  100 mg Oral BID Francesca Oman, DO   100 mg at 04/01/16 2307  . furosemide (LASIX) tablet 60 mg  60 mg Oral Daily Tasrif Ahmed, MD   60 mg at 04/01/16 1007  . gabapentin (NEURONTIN) capsule 400 mg  400 mg Oral QHS Francesca Oman, DO   400 mg at 04/01/16 2307  . insulin aspart (novoLOG) injection 0-9 Units  0-9 Units Subcutaneous TID WC Francesca Oman, DO   3 Units at 04/01/16 1730  .  insulin glargine (LANTUS) injection 18 Units  18 Units Subcutaneous QHS Maryellen Pile, MD   18 Units at 04/01/16 2308  . sodium chloride flush (NS) 0.9 % injection 3 mL  3 mL Intravenous Q12H Francesca Oman, DO   3 mL at 04/01/16 2309  . sodium chloride flush (NS) 0.9 % injection 3 mL  3 mL Intravenous PRN Francesca Oman, DO      . traMADol Veatrice Bourbon) tablet 100 mg  100 mg Oral Daily Maryellen Pile, MD   100 mg at 04/01/16 1006  . traMADol (ULTRAM) tablet 50 mg  50 mg Oral QHS Axel Filler, MD   50 mg at 04/01/16 2307     Discharge Medications: Please see discharge summary for a list of discharge medications.  Relevant Imaging Results:  Relevant Lab Results:   Additional Information SSN: [place], [place]

## 2016-04-03 DIAGNOSIS — R202 Paresthesia of skin: Secondary | ICD-10-CM

## 2016-04-03 LAB — GLUCOSE, CAPILLARY
GLUCOSE-CAPILLARY: 115 mg/dL — AB (ref 65–99)
GLUCOSE-CAPILLARY: 147 mg/dL — AB (ref 65–99)
Glucose-Capillary: 155 mg/dL — ABNORMAL HIGH (ref 65–99)
Glucose-Capillary: 161 mg/dL — ABNORMAL HIGH (ref 65–99)

## 2016-04-03 LAB — BASIC METABOLIC PANEL
ANION GAP: 6 (ref 5–15)
BUN: 31 mg/dL — AB (ref 6–20)
CALCIUM: 8.5 mg/dL — AB (ref 8.9–10.3)
CO2: 27 mmol/L (ref 22–32)
Chloride: 101 mmol/L (ref 101–111)
Creatinine, Ser: 1.34 mg/dL — ABNORMAL HIGH (ref 0.44–1.00)
GFR calc Af Amer: 43 mL/min — ABNORMAL LOW (ref >60)
GFR, EST NON AFRICAN AMERICAN: 37 mL/min — AB (ref >60)
Glucose, Bld: 119 mg/dL — ABNORMAL HIGH (ref 65–99)
POTASSIUM: 3.8 mmol/L (ref 3.5–5.1)
SODIUM: 134 mmol/L — AB (ref 135–145)

## 2016-04-03 LAB — URINE CULTURE: SPECIAL REQUESTS: NORMAL

## 2016-04-03 MED ORDER — FUROSEMIDE 40 MG PO TABS: 40.0000 mg | ORAL_TABLET | Freq: Every day | ORAL | Status: DC

## 2016-04-03 MED ORDER — ATORVASTATIN CALCIUM 40 MG PO TABS: 40.0000 mg | ORAL_TABLET | Freq: Every day | ORAL | Status: DC

## 2016-04-03 NOTE — Progress Notes (Signed)
Rehab admissions - I have not heard back from insurance carrier yet.  I have placed phone calls to [place].  Call me for questions.  [phone]

## 2016-04-03 NOTE — Progress Notes (Signed)
Subjective: No events overnight. Patient reports bilateral hand numbness ongoing for the the past several several months. Repors weakness is unchanged. Denies any dysuria. Did have a fever to 100.6 yesterday afternoon. Reports feeling warm but denies any chills, nausea, vomiting or any other symptoms.   Objective: Vital signs in last 24 hours: Filed Vitals:   04/02/16 1809 04/02/16 2200 04/03/16 0112 04/03/16 0453  BP: 88/44 94/48 115/56 95/44  Pulse: 86 71 68 61  Temp: 100.6 F (38.1 C) 99.5 F (37.5 C) 98.7 F (37.1 C) 99.1 F (37.3 C)  TempSrc: Oral Oral Oral Oral  Resp: 20 18 16 16   Height:      Weight:      SpO2: 95% 97% 98% 98%   Weight change:  No intake or output data in the 24 hours ending 04/03/16 0722   General: resting in bed in NAD HEENT: PERRL, EOMI, no scleral icterus, left lower facial droop resolved Cardiac: irregular distant HS, no rubs, murmurs or gallops Pulm: clear to auscultation bilaterally, no w/r/r, moving normal volumes of air Abd: soft, obese, nontender, nondistended, BS present Ext: warm and well perfused, 1+ B/L pedal edema, chronic venous skin changes.  Neuro: alert and oriented X3, cranial nerves II-XII grossly intact, 5/5 MMS RUE and RLE, 4+/5 LUE and LLE, sensation grossly equal and intact, normal Babinski. Negative tinel's sign and Phalen's sign.   Medications: I have reviewed the patient's current medications. Scheduled Meds: . atorvastatin  40 mg Oral q1800  . carvedilol  6.25 mg Oral BID WC  . cefTRIAXone (ROCEPHIN)  IV  1 g Intravenous Q24H  . docusate sodium  100 mg Oral BID  . furosemide  40 mg Oral Daily  . gabapentin  400 mg Oral QHS  . insulin aspart  0-9 Units Subcutaneous TID WC  . insulin glargine  18 Units Subcutaneous QHS  . losartan  12.5 mg Oral Daily  . sodium chloride flush  3 mL Intravenous Q12H  . traMADol  100 mg Oral Daily  . traMADol  50 mg Oral QHS   Continuous Infusions: . sodium chloride Stopped (03/30/16  1625)  . sodium chloride 100 mL/hr at 03/31/16 1848   PRN Meds:.acetaminophen, sodium chloride flush Assessment/Plan:  CVA: right MCA infarcts with hemorrhagic transformation: MRI shows acute infarct of the right caudate, lentiform nucleus, insular cortex and portions of the right temporal lobe. Small hemorrhagic conversion within the right basal ganglia infarcts. Repeat head CT showed stable hemorrhagic area.  CVA likely secondary to emboli from her a. Fib.  -BP soft this morning, will hold losartan -Will continue to hold ASA and also anticoag for now. Plan to repeat CT head in a week and consider starting DOAC vs Warfarin at that time if no further hemorrhagic changes.  -Continue Lipitor 40 mg daily -telemetry monitoring -discharge pending bed placement SNF vs CIR  UTI with nitrite + UA on admission: Temp to 100.6 yesterday. No further fevers. Denies any other symptoms.  - RN noted some foul smell, Denies any urinary complaints. However, since she is elderly and would be at high risk of delirium if she did have worsening UTI, we will treat her for 3 days of ceftriaxone. Last dose today.  - ceftriaxone 5/21 >> 5/23.   Peramanent fibrillation on coumadin PTA: Rate controlled on BB. CHADSVASc score is 6 and she is on coumadin for A/C. INR was subtherapeutic at PCP earlier this week and this admission is 1.59.  - resumed bblocker  - hold A/C  as above  Diabetes: Reports compliance with Lantus 32 units qHS. A1c 8.1. CBG 115 this morning.  - Continue Lantus 18 units qHs while inpatient to avoid hypoglycemia - SSI  HTN: SBP 90-100s overnight. Currently on Carvedilol 6.25 mg bid and lasix 60 mg daily.  -Will hold lostartan  -Continue BB and Lasix -Check BMET in am  Hyperlipidemia: She reports compliance with simvastatin 40mg . Lipid panel  reveals LDL 53, HDL 39, TG 50, TChol 102.  -Lipitor 40 mg qhs  Coronary atherosclerosis: Stable. She has not needed to use SL NTG recently.  EKG technically meets criteria for LBBB with broad R in I, aVL, V6 (mildly prolonged QRS ~ 125) but morphology otherwise similar to prior EKG and she is asymptomatic w/ negative troponin so I do not suspect underlying ischemia. - hold ASA as above  Chronic systolic heart failure: Stable. EF 40-45% February 2014. EF 35-40% on ECHO here. Her cardiologist is Dr. Angelena Form and she was last seen in Dec 2016. She reports stable symptoms. She is compliant with her med regimen including BB and ARB.  - hold ARB . Resumed BBLocker. Continue lasix 40 mg daily.  CKD3: Stable. Per Care Everywhere record, her baseline Cr appears to be 1.2-1.4. She is at baseline currently. Her nephrologist is Dr[facility] Audie Clear at [facility].  Bilateral hand tingling: Patient reports symptoms for the past 2-3 months. Negative tinel's sign and Phalen's sign today. Intact sensation. Would be unusual for DM neuropathy or B12 def to present in UE initially with no LE symptoms. She does have a history of thoracic spine pain with chronic changes. Possible that she has C-spine changes that are causing the symptoms. Would recommend EMG testing done out patient.   Diet:  carb modified diet  VTE ppx: SCDs.  Code status: Full  Dispo: Discharge to CIR vs SNF pending bed availability   The patient does have a current PCP (Daphene Jaeger, PA-C) and does need an [facility] hospital follow-up appointment after discharge.  The patient does not have transportation limitations that hinder transportation to clinic appointments.  .Services Needed at time of discharge: Y = Yes, Blank = No PT:   OT:   RN:   Equipment:   Other:     LOS: 3 days   Maryellen Pile, MD  04/03/2016, 7:22 AM

## 2016-04-03 NOTE — Progress Notes (Addendum)
3:35pm MD canceled DC to SNF today- transport cancelled, CSW called Aurea Graff to see if [name] can be changed back to CIR, [facility] informed pt no longer coming  2:20pm CSW received call from MD that pt is medically stable for today for DC to SNF- per CIR they do not have availability for today  [facility] aware and changed auth for SNF- family/pt aware and chooses [facility]  Patient will discharge to [facility] Anticipated discharge date: 5/23 Family notified: son at bedside Transportation by [facility]- scheduled for 3:15pm  CSW signing off.  Domenica Reamer, [place] Social Worker [phone]

## 2016-04-03 NOTE — Care Management Note (Signed)
Case Management Note  Patient Details  Name: KIMMEY ROWSEY MRN: [id number] Date of Birth: November 05, 1941  Subjective/Objective:                    Action/Plan: Plan was to d/c to CIR but they do not have a bed today. CM spoke to IR in [facility] and they were not able to take the patient today. Plan is for patient to go to [facility] No further needs per CM.   Expected Discharge Date:                  Expected Discharge Plan:  [place]  In-House Referral:     Discharge planning Services     Post Acute Care Choice:    Choice offered to:     DME Arranged:    DME Agency:     HH Arranged:    [place] Agency:     Status of Service:  In process, will continue to follow  Medicare Important Message Given:  Yes Date Medicare IM Given:    Medicare IM give by:    Date Additional Medicare IM Given:    Additional Medicare Important Message give by:     If discussed at [place] of Stay Meetings, dates discussed:    Additional Comments:  Pollie Friar, RN 04/03/2016, 2:42 PM

## 2016-04-03 NOTE — Care Management Important Message (Signed)
Important Message  Patient Details  Name: [name] MRN: [id number] Date of Birth: 06-01-1941   Medicare Important Message Given:  Yes    Loann Quill 04/03/2016, 2:10 PM

## 2016-04-03 NOTE — PMR Pre-admission (Signed)
PMR Admission Coordinator Pre-Admission Assessment  Patient: [name] is an 75 y.o., female MRN: [id number] DOB: 10-04-1941 Height: '5\' 8"'$  (172.7 cm) Weight: 136.079 kg (300 lb)              Insurance Information HMO:      PPO:  Yes     PCP:       IPA:       80/20:       OTHER:   PRIMARY: [employer] Medicare)      Policy#: [id number]      Subscriber: Saundra Shelling CM Name: Sunday Shams      Phone#: [phone]     Fax#: [phone] Pre-Cert#: [id number]      Employer: Retired Benefits:  Phone #: [phone]     Name:  Jaclyn Prime. Date: 11/12/14     Deduct: $0      Out of Pocket Max: $5500 (met $283.00)      Life Max: unlimited CIR: $265 days 1-6 with max $1590      SNF: $0 days 1-20; $165 days 21-100 Outpatient: medical necessity     Co-Pay: $40 Home Health: 100%      Co-Pay: none DME: 80%     Co-Pay: 20% Providers: in network  Emergency [place]    Name Relation Home Work Mobile   [place] Son   [phone]   Amos,Julie Daughter [phone]  [phone]     Current Medical History  Patient Admitting Diagnosis:  R MCA infarct with hemorrhagic transformation  History of Present Illness: A 75 y.o. right handed female with history of CAD status post CABG, ischemic cardiomyopathy, hypertension, diastolic congestive heart failure, diabetes mellitus peripheral neuropathy,Chronic renal insufficiency baseline creatinine 1.3-1.5, atrial fibrillation maintained on chronic Coumadin and aspirin. Per chart review patient lives with son. Ambulating short distances prior to admission using a rolling walker. One level home. Son is only available in the evenings, but grand daughter may be available during the day. Presented 03/30/2016 with sudden onset of left-sided weakness and facial droop with slurred speech. Cranial CT scan showed focal hypodensity within the right basal ganglia highly suspicious for evolving ischemia.  Possible extension into the right subinsular region. Remote pontine infarct. Patient did not receive TPA. INR on admission of 1.59. CT angiogram head and neck showed right proximal M2 occlusion with distal reconstitution. There was completed infarct in the right caudate, putamen, internal capsule and posterior insula. Echocardiogram with ejection fraction of 40% diffuse hypokinesis. MRI/CT 03/31/2016 of the brain showed right MCA with hemorrhagic transformation felt to be embolic secondary to atrial fibrillation. Neurology follow-up hold on antiplatelet and eliquis for now. Recommendations are to begin [facility] after repeat CT of head at day 7 post stroke. Urine study positive nitrite And greater than 100,000 Escherichia coli With intravenous Rocephin completed 04/03/2016. Tolerating a regular consistency diet. Patient to be admitted for a comprehensive inpatient rehabilitation program.    Total: 1=NIH  Past Medical History  Past Medical History  Diagnosis Date  . CAD (coronary artery disease) of artery bypass graft 05/2008    S/P CABG in 2009  . Ischemic cardiomyopathy   . Systolic heart failure   . Atrial fibrillation ([place])     Postoperative in 2009;  S/P transesophageal echocardiography-guided cardioversion, previously on Amiodarone and Coumadin; recurrent in April 2013  . Hyperlipidemia   . Chronic anticoagulation   . Obesities, morbid ([place])   . Hypertension   . Heart murmur   . CHF (  congestive heart failure) ([place])   . Type II diabetes mellitus ([place])   . TIA (transient ischemic attack) 03/30/2016  . Arthritis     "qwhere" (03/30/2016)  . Chronic lower back pain   . Chronic kidney disease (CKD), stage III (moderate)     Family History  family history includes Clotting disorder in her mother; Emphysema in her father; Lung cancer in her brother. There is no history of Heart disease.  Prior Rehab/Hospitalizations: Has a CNA Monday and Friday to assist with bath/shower for 1 3/4 hours a  day.  Has the patient had major surgery during 100 days prior to admission? No  Current Medications   Current facility-administered medications:  .  0.9 %  sodium chloride infusion, , Intravenous, Continuous, Ram Fuller Mandril, MD, Stopped at 03/30/16 1625 .  0.9 %  sodium chloride infusion, , Intravenous, Continuous, Ram Fuller Mandril, MD, Last Rate: 100 mL/hr at 03/31/16 1848 .  acetaminophen (TYLENOL) tablet 1,000 mg, 1,000 mg, Oral, Q6H PRN, Milagros Loll, MD, 1,000 mg at 04/02/16 1731 .  atorvastatin (LIPITOR) tablet 40 mg, 40 mg, Oral, q1800, Maryellen Pile, MD, 40 mg at 04/03/16 1647 .  carvedilol (COREG) tablet 6.25 mg, 6.25 mg, Oral, BID WC, Tasrif Ahmed, MD, 6.25 mg at 04/02/16 1641 .  cephALEXin (KEFLEX) capsule 500 mg, 500 mg, Oral, Q12H, Sid Falcon, MD .  docusate sodium (COLACE) capsule 100 mg, 100 mg, Oral, BID, Francesca Oman, DO, 100 mg at 04/03/16 2216 .  furosemide (LASIX) tablet 40 mg, 40 mg, Oral, Daily, Maryellen Pile, MD, 40 mg at 04/03/16 0905 .  gabapentin (NEURONTIN) capsule 400 mg, 400 mg, Oral, QHS, Francesca Oman, DO, 400 mg at 04/03/16 2216 .  insulin aspart (novoLOG) injection 0-9 Units, 0-9 Units, Subcutaneous, TID WC, Francesca Oman, DO, 2 Units at 04/03/16 1646 .  insulin glargine (LANTUS) injection 18 Units, 18 Units, Subcutaneous, QHS, Maryellen Pile, MD, 18 Units at 04/03/16 2217 .  sodium chloride flush (NS) 0.9 % injection 3 mL, 3 mL, Intravenous, Q12H, Francesca Oman, DO, 3 mL at 04/03/16 2218 .  sodium chloride flush (NS) 0.9 % injection 3 mL, 3 mL, Intravenous, PRN, Francesca Oman, DO .  traMADol Veatrice Bourbon) tablet 100 mg, 100 mg, Oral, Daily, Maryellen Pile, MD, 100 mg at 04/03/16 0905 .  traMADol (ULTRAM) tablet 50 mg, 50 mg, Oral, QHS, Axel Filler, MD, 50 mg at 04/03/16 2216  Patients Current Diet: Diet heart healthy/carb modified Room service appropriate?: Yes; Fluid consistency:: Thin Diet - low sodium heart  healthy  Precautions / Restrictions Precautions Precautions: Fall Precaution Comments: left knee weakness Restrictions Weight Bearing Restrictions: No   Has the patient had 2 or more falls or a fall with injury in the past year?No.  Patient reports 1 fall with no injury.  Prior Activity Level Limited Community (1-2x/wk): Went out 3-4 X a week.  She did drive, but son drives if it rains.  Home Assistive Devices / Equipment Home Assistive Devices/Equipment: Environmental consultant (specify type), Eyeglasses, CBG Meter, Shower chair with back, Hospital bed, Grab bars around toilet, Hand-held shower hose Home Equipment: Walker - 4 wheels  Prior Device Use: Indicate devices/aids used by the patient prior to current illness, exacerbation or injury? Rollator Walker  Prior Functional Level Prior Function Level of Independence: Needs assistance Gait / Transfers Assistance Needed: per son and patient could ambulate and transfer independently, used rollator for assitance ADL's / Homemaking Assistance Needed: Aide in 2x/week  to assist with bathing  Self Care: Did the patient need help bathing, dressing, using the toilet or eating?  Needed some help  Indoor Mobility: Did the patient need assistance with walking from room to room (with or without device)? Independent  Stairs: Did the patient need assistance with internal or external stairs (with or without device)? Needed some help  Functional Cognition: Did the patient need help planning regular tasks such as shopping or remembering to take medications? Independent  Current Functional Level Cognition  Overall Cognitive Status: Within Functional Limits for tasks assessed Orientation Level: Oriented X4 Comments: 26/30 on MoCA    Extremity Assessment (includes Sensation/Coordination)  Upper Extremity Assessment: Overall WFL for tasks assessed  Lower Extremity Assessment: Defer to PT evaluation    ADLs  Overall ADL's : Needs  assistance/impaired Eating/Feeding: Independent, Sitting Grooming: Set up, Sitting, Supervision/safety Upper Body Bathing: Minimal assitance, Sitting Lower Body Bathing: Maximal assistance, Sit to/from stand Upper Body Dressing : Minimal assistance, Sitting Lower Body Dressing: Maximal assistance, Sit to/from stand Lower Body Dressing Details (indicate cue type and reason): Pt unable to reach feet. Required assist to start underwear over feet and assist to pull up once in standing. Toilet Transfer: Moderate assistance, +2 for physical assistance, Ambulation, BSC, RW (BSC over toilet) Toileting- Clothing Manipulation and Hygiene: Moderate assistance, Sit to/from stand, +2 for physical assistance Toileting - Clothing Manipulation Details (indicate cue type and reason): +2 for sit to stand. Min guard for toilet hygiene in sitting. Mod assist for clothing Functional mobility during ADLs: Moderate assistance, +2 for physical assistance, Rolling walker General ADL Comments: Attempted mobility with bari rollator since pt uses one at home; feel pt would be safer with bari RW.    Mobility  Overal bed mobility: Needs Assistance Bed Mobility: Supine to Sit Supine to sit: Mod assist, +2 for physical assistance, HOB elevated General bed mobility comments: Assist with scooting bottom, to elevate trunk and move LLE to EOB. Increased time and cues.     Transfers  Overall transfer level: Needs assistance Equipment used: Rolling walker (2 wheeled) Transfers: Sit to/from Stand Sit to Stand: Mod assist, Max assist Stand pivot transfers: Min assist, +2 physical assistance General transfer comment: Mod-Max A to stand from EOB x1, from toilet x2. Cues for hand placement/technique and use of forward momentum. transferred to chair post ambulation bout.    Ambulation / Gait / Stairs / Wheelchair Mobility  Ambulation/Gait Ambulation/Gait assistance: Min assist, +2 physical assistance Ambulation Distance (Feet):  12 Feet (x2 bouts) Assistive device: Rolling walker (2 wheeled) (bari RW) Gait Pattern/deviations: Step-to pattern, Decreased stance time - left, Decreased step length - right, Trunk flexed, Wide base of support General Gait Details: Cues for RW proximity/management. Some stability needed for left knee during stance phase but no knee buckling noted.  Gait velocity: decreased    Posture / Balance Dynamic Sitting Balance Sitting balance - Comments: UE support while on bed, able to balance self on commode Balance Overall balance assessment: Needs assistance Sitting-balance support: Feet supported, Bilateral upper extremity supported Sitting balance-Leahy Scale: Fair Sitting balance - Comments: UE support while on bed, able to balance self on commode Standing balance support: During functional activity Standing balance-Leahy Scale: Poor Standing balance comment: Reliant on BUE for support. Total A for pericare in standing.     Special needs/care consideration BiPAP/CPAP No CPM No Continuous Drip IV NSL Dialysis No      Life Vest No Oxygen No Special Bed No Trach Size No Wound Vac (  area) No     Skin Has a spot on her right hand and a wound on her left shin, bruises on her arms                Bowel mgmt: Last BM 04/02/16 Bladder mgmt: Voiding up in bathroom with assist with incontinence Diabetic mgmt Yes, on insulin at home    Previous [place]: Children  Lives With: Son Available Help at Discharge: Family (son only available in evenings) Type of Home: House Home Layout: One level Home Care Services: Yes Type of Home Care Services: Homehealth aide, [place] (if known): [facility] [place]"  Discharge Living Setting Plans for Discharge Living Setting: Patient's home, House, Lives with (comment) (Lives with her son.) Type of Home at Discharge: House (Modular home.) Discharge Home Layout: One level Discharge Home Access:  Ramped entrance (Ramp at the back entrance.) Does the patient have any problems obtaining your medications?: No  Social/Family/Support Systems Patient Roles: Parent (Has a son and a daughter.  Another son died in a house fire.) Contact Information: Mickel Crow - daughter - (h) [phone] (c) [phone] Anticipated Caregiver: Derenda Mis - son Anticipated Caregiver's Contact Information: [name] - son (c) [phone] Ability/Limitations of Caregiver: Daughter lives 30 mins away.  Son lives with patient and can assist. Caregiver Availability: 24/7 Discharge Plan Discussed with Primary Caregiver: Yes Is Caregiver In Agreement with Plan?: Yes Does Caregiver/Family have Issues with Lodging/Transportation while Pt is in Rehab?: No  Goals/Additional Needs Patient/Family Goal for Rehab: PT/OT supervision and min assist goals Expected length of stay: 16-19 days Cultural Considerations: Attends [employer] - nonden[employer]ational church Dietary Needs: Heart healthy, carb modified, thin liquids Equipment Needs: TBD Pt/Family Agrees to Admission and willing to participate: Yes Program Orientation Provided & Reviewed with Pt/Caregiver Including Roles  & Responsibilities: Yes  Decrease burden of Care through IP rehab admission: N/A  Possible need for SNF placement upon discharge: Not anticipated  Patient Condition: This patient's medical and functional status has changed since the consult dated: 04/02/16 in which the Rehabilitation Physician determined and documented that the patient's condition is appropriate for intensive rehabilitative care in an inpatient rehabilitation facility. See "History of Present Illness" (above) for medical update. Functional changes are: Currently requiring mod/max assist for transfers and then min assist to ambulate 12 feet RW. Patient's medical and functional status update has been discussed with the Rehabilitation physician and patient remains appropriate for inpatient  rehabilitation. Will admit to inpatient rehab today.  Preadmission Screen Completed By:  [name], 04/04/2016 10:02 AM ______________________________________________________________________   Discussed status with Dr. Posey Pronto on 04/04/16 at 22 and received telephone approval for admission today.  Admission Coordinator:  [name], time1005/Date05/24/17

## 2016-04-03 NOTE — Progress Notes (Signed)
Rehab admissions - I now have approval for acute inpatient rehab admission.  I met with patient and her son. They are in agreement to inpatient rehab admission.  I do not have a rehab bed open today.  I do anticipate having a rehab bed open tomorrow.  I will check back in am.  Call me for questions.  #[phone]

## 2016-04-03 NOTE — Discharge Summary (Signed)
Name: [name] MRN: [id number] DOB: [birth year] 75 y.o. PCP: Daphene Jaeger, PA-C  Date of Admission: 03/30/2016  5:51 AM Date of Discharge: 04/04/2016 Attending Physician: Sid Falcon, MD  Discharge Diagnosis: Principal Problem:   CVA (cerebral vascular accident) [name]) Active Problems:   Diabetes ([place])   Hyperlipidemia   Coronary atherosclerosis   ATRIAL FIBRILLATION   SYSTOLIC HEART FAILURE, CHRONIC   CKD (chronic kidney disease) stage 3, GFR 30-59 ml/min   Chronic anticoagulation   Acute hemorrhagic infarction of brain ([place])   Cerebrovascular accident (CVA) due to embolism of right middle cerebral artery ([place])   Subtherapeutic international normalized ratio (INR)   Acute CVA (cerebrovascular accident) ([place])   UTI (urinary tract infection)   Chronic combined systolic and diastolic congestive heart failure (HCC)   Benign essential HTN   DM type 2 with diabetic peripheral neuropathy ([place])   Tachypnea   Thrombocytopenia (HCC)  Discharge Medications:   Medication List    STOP taking these medications        aspirin EC 81 MG tablet     losartan 25 MG tablet  Commonly known as:  COZAAR     simvastatin 40 MG tablet  Commonly known as:  ZOCOR     warfarin 1 MG tablet  Commonly known as:  COUMADIN     warfarin 5 MG tablet  Commonly known as:  COUMADIN      TAKE these medications        atorvastatin 40 MG tablet  Commonly known as:  LIPITOR  Take 1 tablet (40 mg total) by mouth daily at 6 PM.     carvedilol 6.25 MG tablet  Commonly known as:  COREG  Take 1 tablet (6.25 mg total) by mouth 2 (two) times daily with a meal.     cholecalciferol 1000 units tablet  Commonly known as:  VITAMIN D  Take 1,000 Units by mouth 2 (two) times daily.     docusate sodium 100 MG capsule  Commonly known as:  COLACE  Take 100 mg by mouth 2 (two) times daily.     furosemide 40 MG tablet  Commonly known as:  LASIX  Take 1 tablet (40 mg total) by mouth daily.     gabapentin 400 MG capsule  Commonly known as:  NEURONTIN  Take 400 mg by mouth at bedtime.     insulin glargine 100 UNIT/ML injection  Commonly known as:  LANTUS  Inject 32 Units into the skin at bedtime.     nitroGLYCERIN 0.4 MG SL tablet  Commonly known as:  NITROSTAT  Place 0.4 mg under the tongue every 5 (five) minutes as needed. May repeat for up to 3 doses.     potassium chloride SA 20 MEQ tablet  Commonly known as:  K-DUR,KLOR-CON  Take 20 mEq by mouth daily.     rOPINIRole 0.5 MG tablet  Commonly known as:  REQUIP  Take 1 mg by mouth at bedtime.     traMADol 50 MG tablet  Commonly known as:  ULTRAM  Take 50-100 mg by mouth 2 (two) times daily. Take 2 tablets every morning and take 1 tablet at night     vitamin B-12 1000 MCG tablet  Commonly known as:  CYANOCOBALAMIN  Take 1,000 mcg by mouth daily.        Disposition and follow-up:   [name] was discharged from [facility] in Good condition.  At the hospital follow up visit please address:  CVA with Hemorrhagic Changes: Holding AC given hemorrhagic changes. Will need repeat head CT on 5/27 to assess for resolution. If improved can start Eliquis bid and ASA 81 mg daily. Changed statin to atorvastatin 40 mg.  HTN: SBP dropped to 90-100 upon resuming losartan at half dose (12.5mg ). Holding losartan at discharge. Will continue Carvedilol 6.25 mg bid and decrease lasix to 40 mg daily from 60 mg daily. Please assess BP and adjust medications as indicated. She would benefit from losartan given her DM if BP able to tolerate.  UTI- Growing pan sensitive E.Coli. Received ceftriaxone x 3 days. Started Keflex day of discharge to finish 7 day course with end date 5/27. B/L hand tingling: Reports 2-3 month history. Would recommend EMG testing outpatient.   2.  Labs / imaging needed at time of follow-up: CT head 5/27  3.  Pending labs/ test needing follow-up: None  Follow-up Appointments: Follow-up  Information    Follow up with Xu,Jindong, MD. Schedule an appointment as soon as possible for a visit in 2 months.   Specialty:  Neurology   Why:  stroke clinic   Contact information:   [address] [place] [address] [phone]       Schedule an appointment as soon as possible for a visit with HOUT, BRITTANY, PA-C.   Specialty:  Physician Assistant   Why:  hospital follow up   Contact information:   [facility] [address] [place] [address] [phone]       Follow up with [facility] .   Specialty:  [place] information:   [id number] [phone]      Discharge Instructions: Discharge Instructions    Ambulatory referral to Neurology    Complete by:  As directed   Pt will follow up with Dr. Erlinda Hong at [facility] in about 2 months. Thanks.     Diet - low sodium heart healthy    Complete by:  As directed      Increase activity slowly    Complete by:  As directed            Consultations:    Procedures Performed:  Ct Angio Head W/cm &/or Wo Cm  03/30/2016  CLINICAL DATA:  Left facial numbness and slurred speech since this morning. Stroke. EXAM: CT ANGIOGRAPHY HEAD AND NECK TECHNIQUE: Multidetector CT imaging of the head and neck was performed using the standard protocol during bolus administration of intravenous contrast. Multiplanar CT image reconstructions and MIPs were obtained to evaluate the vascular anatomy. Carotid stenosis measurements (when applicable) are obtained utilizing NASCET criteria, using the distal internal carotid diameter as the denominator. CONTRAST:  Dose currently not available, reference EMR. COMPARISON:  None. FINDINGS: CTA NECK Aortic arch: Negative for aneurysm. Status post CABG. No dissection. Three vessel branching Right carotid system: Predominately calcified atherosclerosis at the bifurcation without flow limiting (greater  than 50%) stenosis. No dissection or ulceration is seen. Left carotid system: Calcific plaque at the bifurcation without stenosis greater than 50%. No dissection or ulceration. Vertebral arteries:No flow limiting subclavian artery stenosis. Left dominant. Calcific plaque at the left vertebral artery origin without flow limiting stenosis. No stenosis or dissection to the dura. Skeleton: No contributory finding. Diffuse facet and disc degeneration. Chronic anterior dislocation of the right sternoclavicular joint. Other neck: No incidental mass or adenopathy detected in the neck. CTA HEAD Anterior circulation: Atherosclerosis of the carotid siphons without  stenosis. Anterior communicating artery and left posterior communicating artery. Abrupt non filling of a proximal right M2 branch as it enters the sylvian fissure. There is downstream reconstitution which is fairly prompt and symmetric. No other embolism is seen. Posterior circulation: Left dominant. The right vertebral artery ends in PICA. Smooth and widely patent vertebral and basilar arteries. No branch occlusion or flow limiting stenosis. Venous sinuses: Patent Anatomic variants: None significant Delayed phase: Completed infarct in the right caudate and putamen and intervening white matter. Completed infarct in the posterior right insula Dr Silverio Decamp paged at 10:48 and :52 a.m. These results were called by telephone at the time of interpretation on 03/30/2016 at 11:03 am to Dr. Silverio Decamp , who verbally acknowledged these results. IMPRESSION: 1. Right proximal M2 occlusion with distal reconstitution. There is completed infarct in the right caudate, putamen, internal capsule, and posterior insula. 2. No flow limiting stenosis or embolic source identified in the neck. Electronically Signed   By: Monte Fantasia M.D.   On: 03/30/2016 11:05   Ct Head Wo Contrast  03/31/2016  CLINICAL DATA:  Hemorrhagic infarct. EXAM: CT HEAD WITHOUT CONTRAST TECHNIQUE: Contiguous  axial images were obtained from the base of the skull through the vertex without intravenous contrast. COMPARISON:  MRI brain 03/30/2016.  CT head 03/30/2016. FINDINGS: Hemorrhagic infarcts involving the right lentiform nucleus and caudate head are stable. There is continued evolution of nonhemorrhagic infarcts involving the posterior right lentiform nucleus and temporal lobe. No new areas of acute infarct are present. There is no expansion of hemorrhage within the right basal ganglia. The left basal ganglia are intact. Minimal mucosal thickening is present in the left maxillary sinus. The remaining paranasal sinuses and the mastoid air cells are clear. Dense calcifications are present within the cavernous internal carotid arteries bilaterally. The calvarium is intact. No significant extracranial soft tissue lesions are present. IMPRESSION: 1. Stable appearance of hemorrhagic infarcts involving the right lentiform nucleus and caudate head without expansion. 2. Expected evolution of nonhemorrhagic infarcts involving the posterior right insular cortex and temporal lobe. 3. Stable white matter disease without other acute infarct. Electronically Signed   By: San Morelle M.D.   On: 03/31/2016 21:34   Ct Head Wo Contrast  03/30/2016  ADDENDUM REPORT: 03/30/2016 08:06 ADDENDUM: Study discussed by telephone with Dr. Delora Fuel on [id number] at 0755 hours. We discussed additional workup for presumed emergent right MCA occlusion as the patient presents with left hemiparesis, but unknown time of ictus. There are early changes of right MCA territory infarct. ASPECTS score = 7 (changes in the caudate, lentiform, and insula). [place] Stroke Program Early CT Score Normal score = 10 Electronically Signed   By: Genevie Ann M.D.   On: 03/30/2016 08:06  03/30/2016  CLINICAL DATA:  Initial evaluation for acute left hemi paresis. EXAM: CT HEAD WITHOUT CONTRAST TECHNIQUE: Contiguous axial images were obtained from the base of  the skull through the vertex without intravenous contrast. COMPARISON:  None. FINDINGS: Diffuse prominence of the CSF containing spaces is compatible with generalized age-related cerebral atrophy. Patchy hypodensity within the periventricular and deep white matter both cerebral hemispheres most compatible chronic small vessel ischemic disease. Prominent vascular calcifications within the carotid siphons and distal vertebral arteries. There is vague hypodensity involving the right basal ganglia, specifically involving the right caudate head and lentiform nuclei, concerning for evolving ischemia. Possible extension into the right subinsular region and/or insular cortex. No associated hemorrhage or significant mass effect. Question of asymmetric hyperdensity within the distal right  M1 segment, which may reflect intraluminal thrombus (series 201, image 11). Deep gray nuclei otherwise maintained. No other definite evolving cortical ischemia. No left cerebral or cerebellar infarcts identified. Focal hypodensity within the central pons favored to be chronic in nature. No mass lesion, midline shift, or significant mass effect. No hydrocephalus. No extra-axial fluid collection. Scalp soft tissues within normal limits. No acute abnormality about the orbits. Paranasal sinuses are clear.  No mastoid effusion. Calvarium intact. IMPRESSION: 1. Focal hypodensity within the right basal ganglia, highly suspicious for evolving ischemia. Possible extension into the right subinsular region and insular cortex may be present as well. No associated hemorrhage. 2. Asymmetric hyperdensity within the distal right M1 segment, which may reflect intraluminal thrombus versus atheromatous plaque. 3. Remote pontine infarct. 4. Generalized cerebral atrophy with chronic microvascular ischemic disease and intracranial atherosclerosis. Electronically Signed: By: Jeannine Boga M.D. On: 03/30/2016 07:18   Ct Angio Neck W/cm &/or  Wo/cm  03/30/2016  CLINICAL DATA:  Left facial numbness and slurred speech since this morning. Stroke. EXAM: CT ANGIOGRAPHY HEAD AND NECK TECHNIQUE: Multidetector CT imaging of the head and neck was performed using the standard protocol during bolus administration of intravenous contrast. Multiplanar CT image reconstructions and MIPs were obtained to evaluate the vascular anatomy. Carotid stenosis measurements (when applicable) are obtained utilizing NASCET criteria, using the distal internal carotid diameter as the denominator. CONTRAST:  Dose currently not available, reference EMR. COMPARISON:  None. FINDINGS: CTA NECK Aortic arch: Negative for aneurysm. Status post CABG. No dissection. Three vessel branching Right carotid system: Predominately calcified atherosclerosis at the bifurcation without flow limiting (greater than 50%) stenosis. No dissection or ulceration is seen. Left carotid system: Calcific plaque at the bifurcation without stenosis greater than 50%. No dissection or ulceration. Vertebral arteries:No flow limiting subclavian artery stenosis. Left dominant. Calcific plaque at the left vertebral artery origin without flow limiting stenosis. No stenosis or dissection to the dura. Skeleton: No contributory finding. Diffuse facet and disc degeneration. Chronic anterior dislocation of the right sternoclavicular joint. Other neck: No incidental mass or adenopathy detected in the neck. CTA HEAD Anterior circulation: Atherosclerosis of the carotid siphons without stenosis. Anterior communicating artery and left posterior communicating artery. Abrupt non filling of a proximal right M2 branch as it enters the sylvian fissure. There is downstream reconstitution which is fairly prompt and symmetric. No other embolism is seen. Posterior circulation: Left dominant. The right vertebral artery ends in PICA. Smooth and widely patent vertebral and basilar arteries. No branch occlusion or flow limiting stenosis. Venous  sinuses: Patent Anatomic variants: None significant Delayed phase: Completed infarct in the right caudate and putamen and intervening white matter. Completed infarct in the posterior right insula Dr Silverio Decamp paged at 10:48 and :52 a.m. These results were called by telephone at the time of interpretation on 03/30/2016 at 11:03 am to Dr. Silverio Decamp , who verbally acknowledged these results. IMPRESSION: 1. Right proximal M2 occlusion with distal reconstitution. There is completed infarct in the right caudate, putamen, internal capsule, and posterior insula. 2. No flow limiting stenosis or embolic source identified in the neck. Electronically Signed   By: Monte Fantasia M.D.   On: 03/30/2016 11:05   Mr Brain Wo Contrast  03/30/2016  CLINICAL DATA:  Left-sided facial weakness beginning at 4:30 a.m. today. Slurred speech. Stroke. EXAM: MRI HEAD WITHOUT CONTRAST TECHNIQUE: Multiplanar, multiecho pulse sequences of the brain and surrounding structures were obtained without intravenous contrast. COMPARISON:  CT head without contrast in CTA head and neck  from the same day. FINDINGS: The diffusion-weighted images confirm acute/ subacute infarcts involving the right caudate head and anterior lentiform nucleus. Acute nonhemorrhagic infarct involves the posterior right lentiform nucleus is well is smaller portions of the right temporal cortex. Hemorrhagic conversion is noted within the right basal ganglia infarcts since the earlier CT scan. Edema and mass effect is noted with the petechial hemorrhage and infarct. There is partial effacement of the right lateral ventricle. T2 changes are associated with the areas of acute infarct. Mild atrophy and moderate white matter disease is present in addition to the acute infarcts. The internal auditory canals are within normal limits bilaterally. The brainstem and cerebellum are normal. Flow is present in the major intracranial arteries. The right lens has been replaced. Globes and orbits  are intact. The paranasal sinuses and mastoid air cells are clear. IMPRESSION: 1. Acute/subacute infarcts involve the right caudate head, lentiform nucleus, insular cortex, and portions of the right temporal lobe. 2. Moderate generalized atrophy and diffuse white matter disease likely reflects the sequela of chronic microvascular ischemia in addition to the acute infarcts. These results were called by telephone at the time of interpretation on 03/30/2016 at 9:36 pm to Dr. Orlena Sheldon, who verbally acknowledged these results. Electronically Signed   By: San Morelle M.D.   On: 03/30/2016 21:37   MRI Aaron Edelman:  IMPRESSION: 1. Acute/subacute infarcts involve the right caudate head, lentiform nucleus, insular cortex, and portions of the right temporal lobe. 2. Moderate generalized atrophy and diffuse white matter disease likely reflects the sequela of chronic microvascular ischemia in addition to the acute infarcts.   2D Echo: Study Conclusions  - Left ventricle: The cavity size was normal. Wall thickness was  normal. Systolic function was moderately reduced. The estimated  ejection fraction was in the range of 35% to 40%. Diffuse  hypokinesis. Doppler parameters are consistent with high  ventricular filling pressure. - Aortic valve: There was trivial regurgitation. - Mitral valve: Calcified annulus. There was mild regurgitation. - Left atrium: The atrium was mildly dilated. - Right atrium: The atrium was mildly dilated. - Tricuspid valve: There was moderate regurgitation. - Pulmonary arteries: Systolic pressure was moderately increased.  PA peak pressure: 49 mm Hg (S).  Impressions:  - Technically difficult; definity used; moderate global LV  dysfunction (EF 49); trace AI; mild MR; mild biatrial  enlargement; moderate TR; moderately elevated pulmonary pressure.  Admission HPI: Ms. Wearing is a 75 year old woman with PMH of CAD, chronic systolic HF, Afib on coumadin, HLD and DM  here with c/o left sided weakness and left facial droop since this AM. She lives with her son who witnessed the event; he is present and helps provide history. Was in usual state of health yesterday. Last seen normal around 8:30PM last night. When son came into living room around 4:30AM today she was seated at the computer but back was to the computer. The left side of her face was drooping, speech was slurred and she was unable to get up from the chair when she tried. The patient says she did not feel any differently. She does report B/L tingling or her hands in the ED but she says she has experienced the same sensation in the past. She denies palpitations, lightheadedness, dizziness, numbness/tingling or vision changes during the event.   She denies hx of CVA, tobacco/EtOH/illicit drug use. She usually ambulates with a walker and her son has not noticed any unilateral weakness. She reports compliance with her medications, including coumadin. She  gets her INR checked by PCP and says it is usually 2-3 but this past Tuesday the INR was below 2.   Hospital Course by problem list:  CVA Right MCA infarcts with hemorrhagic transformation: Patient initially presented with c/o left sided weakness and left facial droop. Patient with chronic A. Fib on warfarin. INR was sub-therapetuic (1.6) at PCP's office 3 days prior to presentation. INR 1.6 on admission. MRI shows acute infarct of the right caudate, lentiform nucleus, insular cortex and portions of the right temporal lobe. Small hemorrhagic conversion within the right basal ganglia infarcts. Repeat head CT showed stable hemorrhagic area. 2D ECHO with mildly worse EF 35-40% from prior 40-45%. No evidence of clots. She had 4/5 strength in her LUE and LLE with intact sensation through out. Initially with left lower facial droop that resolved during admission. Given her hemorrhagic transformation, her warfarin was held. Per neuro recs holding warfarin for  7 days with repeat head CT in 7 days (5/27) to assess for any further hemorrhagic change. If stable/improved will restart anticoagulation at that time with Eliquis and ASA. CVA felt to be secondary to emboli from her chronic A. Fib with sub-therapeutic INR. Started high-intensity statin, Lipitor 40 mg daily.  Resumed her home antihypertensives, coreg 6.25 mg and losartan 12.5 mg daily (decreased from 25 mg daily home dose due to low BP). SBP goal <160 with her hemorrhagic changes.  PT recommended CIR.   UTI with nitrite + UA on admission: RN noted some foul smell. Patient denies any urinary complaints. UA on admission with +nitirte and bacteriuria. However, since she is elderly and would be at high risk of delirium if she did have worsening UTI, treated her for 3 days of ceftriaxone. Ceftriaxone 5/21 >> 5/23. UCx grew pan-sensitive E.Coli. Started Keflex at day of discharge to complete 7 day course with end date 5/27.  Peramanent fibrillation on coumadin PTA: Rate controlled on BB. CHADSVASc score is 6 and she is on coumadin for A/C. INR was subtherapeutic at PCP earlier this week and this admission is 1.59. Resumed bblocker. Holding A/C as above.  Diabetes: Reports compliance with Lantus 32 units qHS. A1c 8.1. CBGs well controlled on Lantus 18 units qHs and SSI. Resume home medications at discharge.   HTN: SBP 120-130s. Restarted Carvedilol 6.25 mg bid and lasix 40 mg daily and lostartan 12.5 mg daily (25 mg at home). Goal SBP <160 with her hemorrhagic changes.   Hyperlipidemia: She reports compliance with simvastatin 40mg . Lipid panel reveals LDL 53, HDL 39, TG 50, TChol 102. Started Lipitor 40 mg qhs for high-intensive therapy.   Coronary atherosclerosis: Stable. She has not needed to use SL NTG recently. EKG technically meets criteria for LBBB with broad R in I, aVL, V6 (mildly prolonged QRS ~ 125) but morphology otherwise similar to prior EKG and she is asymptomatic w/ negative  troponin so I do not suspect underlying ischemia. Hold ASA as above.  Chronic systolic heart failure: Stable. EF 40-45% February 2014. EF 35-40% on ECHO here. Her cardiologist is Dr. Angelena Form and she was last seen in Dec 2016. She reports stable symptoms. She is compliant with her med regimen including BB and ARB. Resumed ARB and BBLocker. Decreased lasix 60mg  > 40 mg daily.  CKD3: Stable. Per Care Everywhere record, her baseline Cr appears to be 1.2-1.4. She is at baseline currently. Her nephrologist is Dr. Audie [facility] at [facility].   Bilateral hand tingling: Patient reports symptoms for the past 2-3 months. Negative tinel's sign  and Phalen's sign today. Intact sensation. Would be unusual for DM neuropathy or B12 def to present in UE initially with no LE symptoms. She does have a history of thoracic spine pain with chronic changes. Possible that she has C-spine changes that are causing the symptoms. Would recommend EMG testing done out patient.   Discharge Vitals:   BP 117/48 mmHg  Pulse 79  Temp(Src) 98.3 F (36.8 C) (Oral)  Resp 18  Ht 5\' 8"  (1.727 m)  Wt 300 lb (136.079 kg)  BMI 45.63 kg/m2  SpO2 97%  Discharge Labs:  Results for orders placed or performed during the hospital encounter of 03/30/16 (from the past 24 hour(s))  Glucose, capillary     Status: Abnormal   Collection Time: 04/03/16 11:24 AM  Result Value Ref Range   Glucose-Capillary 147 (H) 65 - 99 mg/dL  Glucose, capillary     Status: Abnormal   Collection Time: 04/03/16  4:10 PM  Result Value Ref Range   Glucose-Capillary 155 (H) 65 - 99 mg/dL  Glucose, capillary     Status: Abnormal   Collection Time: 04/03/16 10:15 PM  Result Value Ref Range   Glucose-Capillary 161 (H) 65 - 99 mg/dL  Glucose, capillary     Status: None   Collection Time: 04/04/16  6:39 AM  Result Value Ref Range   Glucose-Capillary 82 65 - 99 mg/dL   Comment 1 Notify RN    Comment 2 Document in Chart     Signed: Maryellen Pile,  MD 04/04/2016, 10:15 AM

## 2016-04-03 NOTE — Progress Notes (Signed)
  Date: 04/03/2016  Patient name: [name]  Medical record number: [id number]  Date of birth: [birth year]   This patient has been seen and the plan of care was discussed with the house staff. Please see Dr. Shanna Cisco note for complete details. I concur with his findings with the following additions/corrections: Discharge planned to [facility] today per notes by CM.  Important that she have the repeat CT head and restart anticoagulation if appropriate.   Sid Falcon, MD 04/03/2016, 3:33 PM

## 2016-04-04 ENCOUNTER — Inpatient Hospital Stay (HOSPITAL_COMMUNITY)
Admission: RE | Admit: 2016-04-04 | Discharge: 2016-04-12 | DRG: 057 | Disposition: A | Discharge status: Home Health | Payer: Medicare Other | Source: Intra-hospital | Attending: Physical Medicine & Rehabilitation | Admitting: Physical Medicine & Rehabilitation

## 2016-04-04 DIAGNOSIS — I481 Persistent atrial fibrillation: Secondary | ICD-10-CM

## 2016-04-04 DIAGNOSIS — E785 Hyperlipidemia, unspecified: Secondary | ICD-10-CM | POA: Diagnosis present

## 2016-04-04 DIAGNOSIS — I255 Ischemic cardiomyopathy: Secondary | ICD-10-CM | POA: Diagnosis present

## 2016-04-04 DIAGNOSIS — I5022 Chronic systolic (congestive) heart failure: Secondary | ICD-10-CM

## 2016-04-04 DIAGNOSIS — N390 Urinary tract infection, site not specified: Secondary | ICD-10-CM | POA: Diagnosis not present

## 2016-04-04 DIAGNOSIS — K5900 Constipation, unspecified: Secondary | ICD-10-CM | POA: Diagnosis present

## 2016-04-04 DIAGNOSIS — Z96651 Presence of right artificial knee joint: Secondary | ICD-10-CM | POA: Diagnosis present

## 2016-04-04 DIAGNOSIS — I130 Hypertensive heart and chronic kidney disease with heart failure and stage 1 through stage 4 chronic kidney disease, or unspecified chronic kidney disease: Secondary | ICD-10-CM | POA: Diagnosis present

## 2016-04-04 DIAGNOSIS — I5032 Chronic diastolic (congestive) heart failure: Secondary | ICD-10-CM | POA: Diagnosis not present

## 2016-04-04 DIAGNOSIS — I5042 Chronic combined systolic (congestive) and diastolic (congestive) heart failure: Secondary | ICD-10-CM | POA: Diagnosis present

## 2016-04-04 DIAGNOSIS — I4891 Unspecified atrial fibrillation: Secondary | ICD-10-CM | POA: Insufficient documentation

## 2016-04-04 DIAGNOSIS — E1142 Type 2 diabetes mellitus with diabetic polyneuropathy: Secondary | ICD-10-CM | POA: Diagnosis present

## 2016-04-04 DIAGNOSIS — I69354 Hemiplegia and hemiparesis following cerebral infarction affecting left non-dominant side: Principal | ICD-10-CM

## 2016-04-04 DIAGNOSIS — G894 Chronic pain syndrome: Secondary | ICD-10-CM | POA: Diagnosis not present

## 2016-04-04 DIAGNOSIS — Z7901 Long term (current) use of anticoagulants: Secondary | ICD-10-CM | POA: Diagnosis not present

## 2016-04-04 DIAGNOSIS — R29810 Facial weakness: Secondary | ICD-10-CM | POA: Diagnosis present

## 2016-04-04 DIAGNOSIS — I69392 Facial weakness following cerebral infarction: Secondary | ICD-10-CM

## 2016-04-04 DIAGNOSIS — I63511 Cerebral infarction due to unspecified occlusion or stenosis of right middle cerebral artery: Secondary | ICD-10-CM

## 2016-04-04 DIAGNOSIS — I25810 Atherosclerosis of coronary artery bypass graft(s) without angina pectoris: Secondary | ICD-10-CM

## 2016-04-04 DIAGNOSIS — E1122 Type 2 diabetes mellitus with diabetic chronic kidney disease: Secondary | ICD-10-CM | POA: Diagnosis present

## 2016-04-04 DIAGNOSIS — G8190 Hemiplegia, unspecified affecting unspecified side: Secondary | ICD-10-CM

## 2016-04-04 DIAGNOSIS — N189 Chronic kidney disease, unspecified: Secondary | ICD-10-CM | POA: Insufficient documentation

## 2016-04-04 DIAGNOSIS — Z955 Presence of coronary angioplasty implant and graft: Secondary | ICD-10-CM

## 2016-04-04 DIAGNOSIS — Z09 Encounter for follow-up examination after completed treatment for conditions other than malignant neoplasm: Secondary | ICD-10-CM

## 2016-04-04 DIAGNOSIS — N183 Chronic kidney disease, stage 3 (moderate): Secondary | ICD-10-CM | POA: Diagnosis present

## 2016-04-04 DIAGNOSIS — Z79899 Other long term (current) drug therapy: Secondary | ICD-10-CM | POA: Diagnosis not present

## 2016-04-04 DIAGNOSIS — I10 Essential (primary) hypertension: Secondary | ICD-10-CM

## 2016-04-04 DIAGNOSIS — B9620 Unspecified Escherichia coli [E. coli] as the cause of diseases classified elsewhere: Secondary | ICD-10-CM

## 2016-04-04 DIAGNOSIS — I2510 Atherosclerotic heart disease of native coronary artery without angina pectoris: Secondary | ICD-10-CM

## 2016-04-04 LAB — GLUCOSE, CAPILLARY
GLUCOSE-CAPILLARY: 82 mg/dL (ref 65–99)
GLUCOSE-CAPILLARY: 150 mg/dL — AB (ref 65–99)
GLUCOSE-CAPILLARY: 134 mg/dL — AB (ref 65–99)
Glucose-Capillary: 79 mg/dL (ref 65–99)

## 2016-04-04 MED ORDER — ONDANSETRON HCL 4 MG/2ML IJ SOLN: 4.0000 mg | Freq: Four times a day (QID) | INTRAMUSCULAR | Status: DC | PRN

## 2016-04-04 MED ORDER — ATORVASTATIN CALCIUM 40 MG PO TABS
40.0000 mg | ORAL_TABLET | Freq: Every day | ORAL | Status: DC
  Administered 2016-04-04 – 2016-04-11 (×8): 40 mg via ORAL
  Filled 2016-04-04 (×8): qty 1

## 2016-04-04 MED ORDER — FUROSEMIDE 40 MG PO TABS
40.0000 mg | ORAL_TABLET | Freq: Every day | ORAL | Status: DC
  Administered 2016-04-05 – 2016-04-12 (×8): 40 mg via ORAL
  Filled 2016-04-04 (×8): qty 1

## 2016-04-04 MED ORDER — INSULIN GLARGINE 100 UNIT/ML ~~LOC~~ SOLN
18.0000 [IU] | Freq: Every day | SUBCUTANEOUS | Status: DC
  Administered 2016-04-04 – 2016-04-11 (×8): 18 [IU] via SUBCUTANEOUS
  Filled 2016-04-04 (×9): qty 0.18

## 2016-04-04 MED ORDER — INSULIN ASPART 100 UNIT/ML ~~LOC~~ SOLN
0.0000 [IU] | Freq: Three times a day (TID) | SUBCUTANEOUS | Status: DC
  Administered 2016-04-05: 2 [IU] via SUBCUTANEOUS
  Administered 2016-04-05: 1 [IU] via SUBCUTANEOUS
  Administered 2016-04-05 – 2016-04-06 (×3): 2 [IU] via SUBCUTANEOUS
  Administered 2016-04-07: 1 [IU] via SUBCUTANEOUS
  Administered 2016-04-07 – 2016-04-08 (×2): 2 [IU] via SUBCUTANEOUS
  Administered 2016-04-08 – 2016-04-10 (×5): 1 [IU] via SUBCUTANEOUS

## 2016-04-04 MED ORDER — CEPHALEXIN 500 MG PO CAPS
500.0000 mg | ORAL_CAPSULE | Freq: Two times a day (BID) | ORAL | Status: DC
  Administered 2016-04-04: 500 mg via ORAL
  Filled 2016-04-04: qty 1

## 2016-04-04 MED ORDER — SORBITOL 70 % SOLN: 30.0000 mL | Freq: Every day | Status: DC | PRN

## 2016-04-04 MED ORDER — TRAMADOL HCL 50 MG PO TABS
100.0000 mg | ORAL_TABLET | Freq: Every day | ORAL | Status: DC
  Administered 2016-04-05 – 2016-04-12 (×8): 100 mg via ORAL
  Filled 2016-04-04 (×8): qty 2

## 2016-04-04 MED ORDER — CARVEDILOL 6.25 MG PO TABS
6.2500 mg | ORAL_TABLET | Freq: Two times a day (BID) | ORAL | Status: DC
  Administered 2016-04-04 – 2016-04-12 (×16): 6.25 mg via ORAL
  Filled 2016-04-04 (×16): qty 1

## 2016-04-04 MED ORDER — ONDANSETRON HCL 4 MG PO TABS: 4.0000 mg | ORAL_TABLET | Freq: Four times a day (QID) | ORAL | Status: DC | PRN

## 2016-04-04 MED ORDER — ACETAMINOPHEN 325 MG PO TABS
325.0000 mg | ORAL_TABLET | ORAL | Status: DC | PRN
  Administered 2016-04-08 – 2016-04-11 (×5): 650 mg via ORAL
  Filled 2016-04-04 (×5): qty 2

## 2016-04-04 MED ORDER — CEPHALEXIN 250 MG PO CAPS
500.0000 mg | ORAL_CAPSULE | Freq: Two times a day (BID) | ORAL | Status: AC
  Administered 2016-04-04 – 2016-04-08 (×8): 500 mg via ORAL
  Filled 2016-04-04 (×8): qty 2

## 2016-04-04 MED ORDER — TRAMADOL HCL 50 MG PO TABS
50.0000 mg | ORAL_TABLET | Freq: Every day | ORAL | Status: DC
  Administered 2016-04-04 – 2016-04-11 (×8): 50 mg via ORAL
  Filled 2016-04-04 (×8): qty 1

## 2016-04-04 MED ORDER — CEPHALEXIN 500 MG PO CAPS: 500.0000 mg | ORAL_CAPSULE | Freq: Two times a day (BID) | ORAL | Status: DC

## 2016-04-04 MED ORDER — DOCUSATE SODIUM 100 MG PO CAPS
100.0000 mg | ORAL_CAPSULE | Freq: Two times a day (BID) | ORAL | Status: DC
  Administered 2016-04-04 – 2016-04-12 (×16): 100 mg via ORAL
  Filled 2016-04-04 (×16): qty 1

## 2016-04-04 MED ORDER — GABAPENTIN 400 MG PO CAPS
400.0000 mg | ORAL_CAPSULE | Freq: Every day | ORAL | Status: DC
  Administered 2016-04-04 – 2016-04-11 (×8): 400 mg via ORAL
  Filled 2016-04-04 (×8): qty 1

## 2016-04-04 NOTE — Progress Notes (Signed)
Patient received at 1450 alert and oriented x 4 . Bruising noted to bilateral UE's. Discoloration to skin noted to left shin area skin intact. Oriented patient and son to call bell system and safety awareness. Patient and son verbalized understanding of admission process. Continue with plan of care. Mliss Sax

## 2016-04-04 NOTE — Progress Notes (Signed)
Rehab admissions - I do have a bed on inpatient rehab today and will admit patient to inpatient rehab today.  Call me for questions.  RC:[phone]

## 2016-04-04 NOTE — Progress Notes (Signed)
Ankit Lorie Phenix, MD Physician Signed Physical Medicine and Rehabilitation Consult Note 04/02/2016 6:23 AM  Related encounter: ED to Hosp-Admission (Discharged) from 03/30/2016 in [place] Collapse All        Physical Medicine and Rehabilitation Consult Reason for Consult: Right MCA infarct with hemorrhagic transformation Referring Physician: [facility]   HPI: [name] is a 75 y.o. right handed female with history of CAD status post CABG, ischemic cardiomyopathy, hypertension, diastolic congestive heart failure, diabetes mellitus peripheral neuropathy, atrial fibrillation maintained on chronic Coumadin. Per chart review patient lives with son. Ambulating short distances prior to admission using a rolling walker. One level home. Son is only available in the evenings, but grand daughter may be available during the day. Presented 03/30/2016 with sudden onset of left-sided weakness and facial droop with slurred speech. Cranial CT scan showed focal hypodensity within the right basal ganglia highly suspicious for evolving ischemia. Possible extension into the right subinsular region. Remote pontine infarct. Patient did not receive TPA. INR on admission of 1.59. CT angiogram head and neck showed right proximal M2 occlusion with distal reconstitution. There was completed infarct in the right caudate, putamen, internal capsule and posterior insula. Echocardiogram with ejection fraction of 40% diffuse hypokinesis. MRI/CT 03/31/2016 of the brain showed right MCA with hemorrhagic transformation felt to be embolic secondary to atrial fibrillation. Neurology consulted with workup ongoing. Urine study positive nitrite currently maintained on Rocephin. Tolerating a regular consistency diet.   Review of Systems  Constitutional: Negative for fever and chills.  HENT: Negative for hearing loss.  Eyes: Negative for blurred vision and double vision.    Respiratory: Negative for cough.   Increased shortness of breath with exertion  Cardiovascular: Positive for palpitations and leg swelling. Negative for chest pain.  Gastrointestinal: Positive for constipation. Negative for nausea and vomiting.  Genitourinary: Positive for urgency. Negative for dysuria and hematuria.  Musculoskeletal: Positive for myalgias and back pain.  Skin: Negative for rash.  Neurological: Positive for weakness. Negative for seizures and headaches.  All other systems reviewed and are negative.  Past Medical History  Diagnosis Date  . CAD (coronary artery disease) of artery bypass graft 05/2008    S/P CABG in 2009  . Ischemic cardiomyopathy   . Systolic heart failure   . Atrial fibrillation ([place])     Postoperative in 2009; S/P transesophageal echocardiography-guided cardioversion, previously on Amiodarone and Coumadin; recurrent in April 2013  . Hyperlipidemia   . Chronic anticoagulation   . Obesities, morbid ([place])   . Hypertension   . Heart murmur   . CHF (congestive heart failure) ([place])   . Type II diabetes mellitus ([place])   . TIA (transient ischemic attack) 03/30/2016  . Arthritis     "qwhere" (03/30/2016)  . Chronic lower back pain   . Chronic kidney disease (CKD), stage III (moderate)    Past Surgical History  Procedure Laterality Date  . Ventral hernia repair  10/1999    With mesh  . Total knee arthroplasty Right 1994  . Appendectomy  1946  . Joint replacement    . Hernia repair    . Cataract extraction Right 2012  . Coronary angioplasty with stent placement  2009    "put 2 stents in"  . Tee with cardioversion      Postoperative in 2009; S/P transesophageal echocardiography-guided cardioversion,   Family History  Problem Relation Age of Onset  . Clotting disorder Mother  Cerebral hemorrhage  . Emphysema Father      COD  . Lung cancer Brother   . Heart disease Neg Hx    Social History:  reports that she has never smoked. She has never used smokeless tobacco. She reports that she does not drink alcohol or use illicit drugs. Allergies:  Allergies  Allergen Reactions  . Benazepril Other (See Comments)  . Fe-Succ-C-Thre-B12-Des Stomach Other (See Comments)    Pt doesn't remember   . Spironolactone Other (See Comments)    Dizziness   . Sulfamethoxazole-Trimethoprim Other (See Comments)    Pt doesn't remember   Medications Prior to Admission  Medication Sig Dispense Refill  . aspirin EC 81 MG tablet Take 81 mg by mouth daily.    . carvedilol (COREG) 6.25 MG tablet Take 1 tablet (6.25 mg total) by mouth 2 (two) times daily with a meal. 60 tablet 6  . cholecalciferol (VITAMIN D) 1000 UNITS tablet Take 1,000 Units by mouth 2 (two) times daily.    Marland Kitchen docusate sodium (COLACE) 100 MG capsule Take 100 mg by mouth 2 (two) times daily.     . furosemide (LASIX) 40 MG tablet Take 60 mg by mouth daily.     Marland Kitchen gabapentin (NEURONTIN) 400 MG capsule Take 400 mg by mouth at bedtime.    . insulin glargine (LANTUS) 100 UNIT/ML injection Inject 32 Units into the skin at bedtime.     Marland Kitchen losartan (COZAAR) 25 MG tablet Take 25 mg by mouth daily.     . nitroGLYCERIN (NITROSTAT) 0.4 MG SL tablet Place 0.4 mg under the tongue every 5 (five) minutes as needed. May repeat for up to 3 doses.     . potassium chloride SA (K-DUR,KLOR-CON) 20 MEQ tablet Take 20 mEq by mouth daily.     Marland Kitchen rOPINIRole (REQUIP) 0.5 MG tablet Take 1 mg by mouth at bedtime.    . simvastatin (ZOCOR) 40 MG tablet Take 40 mg by mouth at bedtime.     . traMADol (ULTRAM) 50 MG tablet Take 50-100 mg by mouth 2 (two) times daily. Take 2 tablets every morning and take 1 tablet at night    . vitamin B-12 (CYANOCOBALAMIN) 1000 MCG tablet Take 1,000 mcg by mouth  daily.    Marland Kitchen warfarin (COUMADIN) 1 MG tablet Take 1 mg by mouth daily.    Marland Kitchen warfarin (COUMADIN) 5 MG tablet Take 5 mg by mouth daily. As prescribed by coumadin clinic      Home: Home Living Family/patient expects to be discharged to:: Inpatient rehab Living Arrangements: Children Available Help at Discharge: Family (son only available in evenings) Type of Home: House Home Layout: One level Home Equipment: Environmental consultant - 4 wheels Lives With: Son  Functional History: Prior Function Level of Independence: Needs assistance Gait / Transfers Assistance Needed: per son and patient could ambulate and transfer independently, used rollator for [name] ADL's / Homemaking Assistance Needed: Aide in 2x/week to assist with bathing Functional Status:  Mobility: Bed Mobility Overal bed mobility: Needs Assistance, +2 for physical assistance Bed Mobility: Supine to Sit Supine to sit: Mod assist, +2 for physical assistance General bed mobility comments: mod cues for hand placement and sequencing, raised HOB Transfers Overall transfer level: Needs assistance Equipment used: 4-wheeled walker Transfers: Sit to/from Stand Sit to Stand: Mod assist, +2 physical assistance Stand pivot transfers: Min assist, +2 physical assistance General transfer comment: transfer from bed and from [facility] over toilet in bathroom, mod cues for hand placement Ambulation/Gait Ambulation/Gait assistance: Mod  assist, +2 physical assistance Ambulation Distance (Feet): 6 Feet (to bathroom, 8' into room from bathroom) Assistive device: 4-wheeled walker (bariatric rollator) Gait Pattern/deviations: Step-to pattern, Decreased step length - right, Decreased stance time - right, Shuffle, Trunk flexed, Wide base of support General Gait Details: poor walker placement, needs mod cues and assist to maintain control of rollator (placed too far anteriorly). Assist to advance Rt leg during gait. 2 episodes of Rt knee buckling in  stance phase    ADL: ADL Overall ADL's : Needs assistance/impaired Eating/Feeding: Independent, Sitting Grooming: Set up, Sitting, Supervision/safety Upper Body Bathing: Minimal assitance, Sitting Lower Body Bathing: Maximal assistance, Sit to/from stand Upper Body Dressing : Minimal assistance, Sitting Lower Body Dressing: Maximal assistance, Sit to/from stand Lower Body Dressing Details (indicate cue type and reason): Pt unable to reach feet. Required assist to start underwear over feet and assist to pull up once in standing. Toilet Transfer: Moderate assistance, +2 for physical assistance, Ambulation, BSC, RW (BSC over toilet) Toileting- Clothing Manipulation and Hygiene: Moderate assistance, Sit to/from stand, +2 for physical assistance Toileting - Clothing Manipulation Details (indicate cue type and reason): +2 for sit to stand. Min guard for toilet hygiene in sitting. Mod assist for clothing Functional mobility during ADLs: Moderate assistance, +2 for physical assistance, Rolling walker General ADL Comments: Attempted mobility with bari rollator since pt uses one at home; feel pt would be safer with bari RW.  Cognition: Cognition Overall Cognitive Status: Within Functional Limits for tasks assessed Orientation Level: Oriented X4 Comments: 26/30 on MoCA Cognition Arousal/Alertness: Awake/alert Behavior During Therapy: WFL for tasks assessed/performed Overall Cognitive Status: Within Functional Limits for tasks assessed  Blood pressure 134/62, pulse 69, temperature 98.8 F (37.1 C), temperature source Oral, resp. rate 22, height 5\' 8"  (1.727 m), weight 136.079 kg (300 lb), SpO2 98 %. Physical Exam  Vitals reviewed. Constitutional: She is oriented to person, place, and time. She appears well-developed.  75 year old right-handed obese female  HENT:  Head: Normocephalic and atraumatic.  Eyes: Conjunctivae and EOM are normal.  Neck: Normal range of motion. Neck supple. No  thyromegaly present.  Cardiovascular:  Irregulary irregular  Respiratory: Effort normal and breath sounds normal. No respiratory distress.  GI: Soft. Bowel sounds are normal. She exhibits no distension.  Musculoskeletal: She exhibits edema. She exhibits no tenderness.  Neurological: She is alert and oriented to person, place, and time.  Follows commands.  Fair awareness of deficits. DTRs symmetric Sensation intact to light touch ?Mild right facial weakness Motor: RUE/RLE: 5/5 proximal to distal LUE: 4+/5 proximal to distal LLE: 4-4-/5 proximally, 4+/5 distally  Skin: Skin is warm and dry.  Psychiatric: She has a normal mood and affect. Her behavior is normal.     Lab Results Last 24 Hours    Results for orders placed or performed during the hospital encounter of 03/30/16 (from the past 24 hour(s))  Glucose, capillary Status: Abnormal   Collection Time: 04/01/16 6:41 AM  Result Value Ref Range   Glucose-Capillary 128 (H) 65 - 99 mg/dL  Glucose, capillary Status: Abnormal   Collection Time: 04/01/16 11:08 AM  Result Value Ref Range   Glucose-Capillary 216 (H) 65 - 99 mg/dL  Glucose, capillary Status: Abnormal   Collection Time: 04/01/16 4:36 PM  Result Value Ref Range   Glucose-Capillary 221 (H) 65 - 99 mg/dL  Glucose, capillary Status: Abnormal   Collection Time: 04/01/16 10:01 PM  Result Value Ref Range   Glucose-Capillary 188 (H) 65 - 99 mg/dL  Basic metabolic  panel Status: Abnormal   Collection Time: 04/02/16 4:05 AM  Result Value Ref Range   Sodium 136 135 - 145 mmol/L   Potassium 3.8 3.5 - 5.1 mmol/L   Chloride 103 101 - 111 mmol/L   CO2 26 22 - 32 mmol/L   Glucose, Bld 135 (H) 65 - 99 mg/dL   BUN 25 (H) 6 - 20 mg/dL   Creatinine, Ser 1.23 (H) 0.44 - 1.00 mg/dL   Calcium 8.7 (L) 8.9 - 10.3 mg/dL   GFR calc non Af Amer 41 (L) >60 mL/min   GFR calc Af Amer  48 (L) >60 mL/min   Anion gap 7 5 - 15      Imaging Results (Last 48 hours)    Ct Head Wo Contrast  03/31/2016 CLINICAL DATA: Hemorrhagic infarct. EXAM: CT HEAD WITHOUT CONTRAST TECHNIQUE: Contiguous axial images were obtained from the base of the skull through the vertex without intravenous contrast. COMPARISON: MRI brain 03/30/2016. CT head 03/30/2016. FINDINGS: Hemorrhagic infarcts involving the right lentiform nucleus and caudate head are stable. There is continued evolution of nonhemorrhagic infarcts involving the posterior right lentiform nucleus and temporal lobe. No new areas of acute infarct are present. There is no expansion of hemorrhage within the right basal ganglia. The left basal ganglia are intact. Minimal mucosal thickening is present in the left maxillary sinus. The remaining paranasal sinuses and the mastoid air cells are clear. Dense calcifications are present within the cavernous internal carotid arteries bilaterally. The calvarium is intact. No significant extracranial soft tissue lesions are present. IMPRESSION: 1. Stable appearance of hemorrhagic infarcts involving the right lentiform nucleus and caudate head without expansion. 2. Expected evolution of nonhemorrhagic infarcts involving the posterior right insular cortex and temporal lobe. 3. Stable white matter disease without other acute infarct. Electronically Signed By: San Morelle M.D. On: 03/31/2016 21:34     Assessment/Plan: Diagnosis: Right MCA infarct with hemorrhagic transformation Labs and images independently reviewed. Records reviewed and summated above. Stroke: Continue secondary stroke prophylaxis and Risk Factor Modification listed below:  Blood Pressure Management: Continue current medication with prn's with permisive HTN per primary team Diabetes management:  Left sided hemiparesis:   1. Does the need for close, 24 hr/day medical supervision in concert with the patient's  rehab needs make it unreasonable for this patient to be served in a less intensive setting? Yes  2. Co-Morbidities requiring supervision/potential complications: CAD status post CABG (Monitor in accordance with increased physical activity and avoid UE resistance excercises), combined ischemic cardiomyopathy (See previous), HTN (monitor and provide prns in accordance with increased physical exertion and pain), diabetes mellitus peripheral neuropathy (Monitor in accordance with exercise and adjust meds as necessary), atrial fibrillation (Cont meds, monitor with increased physical activity), tachypnea (monitor RR and O2 Sats with increased physical exertion), CKD (avoid nephrotoxic meds), Thrombocytopenia (< 60,000/mm3 no resistive exercise, monitor for antibodies, cont to follow) 3. Due to safety, skin/wound care, disease management and patient education, does the patient require 24 hr/day rehab nursing? Yes 4. Does the patient require coordinated care of a physician, rehab nurse, PT (1-2 hrs/day, 5 days/week) and OT (1-2 hrs/day, 5 days/week) to address physical and functional deficits in the context of the above medical diagnosis(es)? Yes Addressing deficits in the following areas: balance, endurance, locomotion, strength, transferring, bathing, dressing, toileting and psychosocial support 5. Can the patient actively participate in an intensive therapy program of at least 3 hrs of therapy per day at least 5 days per week? Yes 6. The potential for  patient to make measurable gains while on inpatient rehab is excellent 7. Anticipated functional outcomes upon discharge from inpatient rehab are supervision and min assist with PT, supervision and min assist with OT, n/a with SLP. 8. Estimated rehab length of stay to reach the above functional goals is: 16-19 days. 9. Does the patient have adequate social supports and living environment to accommodate these discharge functional goals?  Potentially 10. Anticipated D/C setting: Home 11. Anticipated post D/C treatments: HH therapy and Home excercise program 12. Overall Rehab/Functional Prognosis: good  RECOMMENDATIONS: This patient's condition is appropriate for continued rehabilitative care in the following setting: CIR Patient has agreed to participate in recommended program. Yes Note that insurance prior authorization may be required for reimbursement for recommended care.  Comment: Rehab Admissions Coordinator to follow up.  Delice Lesch, MD 04/02/2016       Revision History     Date/Time User Provider Type Action   04/02/2016 12:29 PM Ankit Lorie Phenix, MD Physician Sign   04/02/2016 6:46 AM Cathlyn Parsons, PA-C Physician Assistant Pend   View Details Report       Routing History     Date/Time From To Method   04/02/2016 12:29 PM Ankit Lorie Phenix, MD Daphene Jaeger, PA-C Fax

## 2016-04-04 NOTE — Progress Notes (Signed)
[name] Diones, RN Rehab Admission Coordinator Signed Physical Medicine and Rehabilitation PMR Pre-admission 04/03/2016 12:15 PM  Related encounter: ED to Hosp-Admission (Discharged) from 03/30/2016 in [place] All Collapse All   PMR Admission Coordinator Pre-Admission Assessment  Patient: [name] is an 75 y.o., female MRN: [id number] DOB: 1941/04/19 Height: '5\' 8"'$  (172.7 cm) Weight: 136.079 kg (300 lb)  Insurance Information HMO: PPO: Yes PCP: IPA: 80/20: OTHER:  PRIMARY: [employer] Holley Bouche Medicare) Policy#: [id number]  Subscriber: Saundra Shelling CM Name: Sunday Shams Phone#: [phone] Fax#: [phone] Pre-Cert#: [id number] Employer: Retired Benefits: Phone #: [phone] Name: Jaclyn Prime. Date: 11/12/14 Deduct: $0 Out of Pocket Max: $5500 (met $283.00) Life Max: unlimited CIR: $265 days 1-6 with max $1590 SNF: $0 days 1-20; $165 days 21-100 Outpatient: medical necessity Co-Pay: $40 Home Health: 100% Co-Pay: none DME: 80% Co-Pay: 20% Providers: in network  Emergency [place]    Name Relation Home Work Mobile   [place] Son   [phone]   Amos,Julie Daughter [phone]  [phone]     Current Medical History  Patient Admitting Diagnosis: R MCA infarct with hemorrhagic transformation  History of Present Illness: A 75 y.o. right handed female with history of CAD status post CABG, ischemic cardiomyopathy, hypertension, diastolic congestive heart failure, diabetes mellitus peripheral neuropathy,Chronic renal insufficiency baseline creatinine 1.3-1.5, atrial fibrillation maintained on chronic Coumadin and  aspirin. Per chart review patient lives with son. Ambulating short distances prior to admission using a rolling walker. One level home. Son is only available in the evenings, but grand daughter may be available during the day. Presented 03/30/2016 with sudden onset of left-sided weakness and facial droop with slurred speech. Cranial CT scan showed focal hypodensity within the right basal ganglia highly suspicious for evolving ischemia. Possible extension into the right subinsular region. Remote pontine infarct. Patient did not receive TPA. INR on admission of 1.59. CT angiogram head and neck showed right proximal M2 occlusion with distal reconstitution. There was completed infarct in the right caudate, putamen, internal capsule and posterior insula. Echocardiogram with ejection fraction of 40% diffuse hypokinesis. MRI/CT 03/31/2016 of the brain showed right MCA with hemorrhagic transformation felt to be embolic secondary to atrial fibrillation. Neurology follow-up hold on antiplatelet and eliquis for now. Recommendations are to begin [facility] after repeat CT of head at day 7 post stroke. Urine study positive nitrite And greater than 100,000 Escherichia coli With intravenous Rocephin completed 04/03/2016. Tolerating a regular consistency diet. Patient to be admitted for a comprehensive inpatient rehabilitation program.   Total: 1=NIH  Past Medical History  Past Medical History  Diagnosis Date  . CAD (coronary artery disease) of artery bypass graft 05/2008    S/P CABG in 2009  . Ischemic cardiomyopathy   . Systolic heart failure   . Atrial fibrillation ([place])     Postoperative in 2009; S/P transesophageal echocardiography-guided cardioversion, previously on Amiodarone and Coumadin; recurrent in April 2013  . Hyperlipidemia   . Chronic anticoagulation   . Obesities, morbid ([place])   . Hypertension   . Heart murmur   . CHF (congestive heart failure) ([place])   . Type  II diabetes mellitus ([place])   . TIA (transient ischemic attack) 03/30/2016  . Arthritis     "qwhere" (03/30/2016)  . Chronic lower back pain   . Chronic kidney disease (CKD), stage III (moderate)     Family History  family history includes Clotting disorder in her mother;  Emphysema in her father; Lung cancer in her brother. There is no history of Heart disease.  Prior Rehab/Hospitalizations: Has a CNA Monday and Friday to assist with bath/shower for 1 3/4 hours a day.  Has the patient had major surgery during 100 days prior to admission? No  Current Medications   Current facility-administered medications:  . 0.9 % sodium chloride infusion, , Intravenous, Continuous, Ram Fuller Mandril, MD, Stopped at 03/30/16 1625 . 0.9 % sodium chloride infusion, , Intravenous, Continuous, Ram Fuller Mandril, MD, Last Rate: 100 mL/hr at 03/31/16 1848 . acetaminophen (TYLENOL) tablet 1,000 mg, 1,000 mg, Oral, Q6H PRN, Milagros Loll, MD, 1,000 mg at 04/02/16 1731 . atorvastatin (LIPITOR) tablet 40 mg, 40 mg, Oral, q1800, Maryellen Pile, MD, 40 mg at 04/03/16 1647 . carvedilol (COREG) tablet 6.25 mg, 6.25 mg, Oral, BID WC, Tasrif Ahmed, MD, 6.25 mg at 04/02/16 1641 . cephALEXin (KEFLEX) capsule 500 mg, 500 mg, Oral, Q12H, Sid Falcon, MD . docusate sodium (COLACE) capsule 100 mg, 100 mg, Oral, BID, Francesca Oman, DO, 100 mg at 04/03/16 2216 . furosemide (LASIX) tablet 40 mg, 40 mg, Oral, Daily, Maryellen Pile, MD, 40 mg at 04/03/16 0905 . gabapentin (NEURONTIN) capsule 400 mg, 400 mg, Oral, QHS, Francesca Oman, DO, 400 mg at 04/03/16 2216 . insulin aspart (novoLOG) injection 0-9 Units, 0-9 Units, Subcutaneous, TID WC, Francesca Oman, DO, 2 Units at 04/03/16 1646 . insulin glargine (LANTUS) injection 18 Units, 18 Units, Subcutaneous, QHS, Maryellen Pile, MD, 18 Units at 04/03/16 2217 . sodium chloride flush (NS) 0.9 % injection 3 mL, 3 mL, Intravenous,  Q12H, Francesca Oman, DO, 3 mL at 04/03/16 2218 . sodium chloride flush (NS) 0.9 % injection 3 mL, 3 mL, Intravenous, PRN, Francesca Oman, DO . traMADol Veatrice Bourbon) tablet 100 mg, 100 mg, Oral, Daily, Maryellen Pile, MD, 100 mg at 04/03/16 0905 . traMADol (ULTRAM) tablet 50 mg, 50 mg, Oral, QHS, Axel Filler, MD, 50 mg at 04/03/16 2216  Patients Current Diet: Diet heart healthy/carb modified Room service appropriate?: Yes; Fluid consistency:: Thin Diet - low sodium heart healthy  Precautions / Restrictions Precautions Precautions: Fall Precaution Comments: left knee weakness Restrictions Weight Bearing Restrictions: No   Has the patient had 2 or more falls or a fall with injury in the past year?No. Patient reports 1 fall with no injury.  Prior Activity Level Limited Community (1-2x/wk): Went out 3-4 X a week. She did drive, but son drives if it rains.  Home Assistive Devices / Equipment Home Assistive Devices/Equipment: Environmental consultant (specify type), Eyeglasses, CBG Meter, Shower chair with back, Hospital bed, Grab bars around toilet, Hand-held shower hose Home Equipment: Walker - 4 wheels  Prior Device Use: Indicate devices/aids used by the patient prior to current illness, exacerbation or injury? Rollator Walker  Prior Functional Level Prior Function Level of Independence: Needs assistance Gait / Transfers Assistance Needed: per son and patient could ambulate and transfer independently, used rollator for assitance ADL's / Homemaking Assistance Needed: Aide in 2x/week to assist with bathing  Self Care: Did the patient need help bathing, dressing, using the toilet or eating? Needed some help  Indoor Mobility: Did the patient need assistance with walking from room to room (with or without device)? Independent  Stairs: Did the patient need assistance with internal or external stairs (with or without device)? Needed some help  Functional Cognition: Did the patient need help  planning regular tasks such as shopping or remembering to take medications?  Independent  Current Functional Level Cognition  Overall Cognitive Status: Within Functional Limits for tasks assessed Orientation Level: Oriented X4 Comments: 26/30 on MoCA   Extremity Assessment (includes Sensation/Coordination)  Upper Extremity Assessment: Overall WFL for tasks assessed  Lower Extremity Assessment: Defer to PT evaluation    ADLs  Overall ADL's : Needs assistance/impaired Eating/Feeding: Independent, Sitting Grooming: Set up, Sitting, Supervision/safety Upper Body Bathing: Minimal assitance, Sitting Lower Body Bathing: Maximal assistance, Sit to/from stand Upper Body Dressing : Minimal assistance, Sitting Lower Body Dressing: Maximal assistance, Sit to/from stand Lower Body Dressing Details (indicate cue type and reason): Pt unable to reach feet. Required assist to start underwear over feet and assist to pull up once in standing. Toilet Transfer: Moderate assistance, +2 for physical assistance, Ambulation, BSC, RW (BSC over toilet) Toileting- Clothing Manipulation and Hygiene: Moderate assistance, Sit to/from stand, +2 for physical assistance Toileting - Clothing Manipulation Details (indicate cue type and reason): +2 for sit to stand. Min guard for toilet hygiene in sitting. Mod assist for clothing Functional mobility during ADLs: Moderate assistance, +2 for physical assistance, Rolling walker General ADL Comments: Attempted mobility with bari rollator since pt uses one at home; feel pt would be safer with bari RW.    Mobility  Overal bed mobility: Needs Assistance Bed Mobility: Supine to Sit Supine to sit: Mod assist, +2 for physical assistance, HOB elevated General bed mobility comments: Assist with scooting bottom, to elevate trunk and move LLE to EOB. Increased time and cues.     Transfers  Overall transfer level: Needs assistance Equipment used: Rolling walker (2  wheeled) Transfers: Sit to/from Stand Sit to Stand: Mod assist, Max assist Stand pivot transfers: Min assist, +2 physical assistance General transfer comment: Mod-Max A to stand from EOB x1, from toilet x2. Cues for hand placement/technique and use of forward momentum. transferred to chair post ambulation bout.    Ambulation / Gait / Stairs / Wheelchair Mobility  Ambulation/Gait Ambulation/Gait assistance: Min assist, +2 physical assistance Ambulation Distance (Feet): 12 Feet (x2 bouts) Assistive device: Rolling walker (2 wheeled) (bari RW) Gait Pattern/deviations: Step-to pattern, Decreased stance time - left, Decreased step length - right, Trunk flexed, Wide base of support General Gait Details: Cues for RW proximity/management. Some stability needed for left knee during stance phase but no knee buckling noted.  Gait velocity: decreased    Posture / Balance Dynamic Sitting Balance Sitting balance - Comments: UE support while on bed, able to balance self on commode Balance Overall balance assessment: Needs assistance Sitting-balance support: Feet supported, Bilateral upper extremity supported Sitting balance-Leahy Scale: Fair Sitting balance - Comments: UE support while on bed, able to balance self on commode Standing balance support: During functional activity Standing balance-Leahy Scale: Poor Standing balance comment: Reliant on BUE for support. Total A for pericare in standing.     Special needs/care consideration BiPAP/CPAP No CPM No Continuous Drip IV NSL Dialysis No  Life Vest No Oxygen No Special Bed No Trach Size No Wound Vac (area) No  Skin Has a spot on her right hand and a wound on her left shin, bruises on her arms  Bowel mgmt: Last BM 04/02/16 Bladder mgmt: Voiding up in bathroom with assist with incontinence Diabetic mgmt Yes, on insulin at home    Previous [place]: Children Lives With:  Son Available Help at Discharge: Family (son only available in evenings) Type of Home: House Home Layout: One level [place]: Yes Type of Home Care Services: Homehealth  aide, [place] (if known): [facility] [place]"  Discharge Living Setting Plans for Discharge Living Setting: Patient's home, House, Lives with (comment) (Lives with her son.) Type of Home at Discharge: House (Modular home.) Discharge Home Layout: One level Discharge Home Access: Ramped entrance (Ramp at the back entrance.) Does the patient have any problems obtaining your medications?: No  Social/Family/Support Systems Patient Roles: Parent (Has a son and a daughter. Another son died in a house fire.) Contact Information: Mickel Crow - daughter - (h) [phone] (c) [phone] Anticipated Caregiver: Derenda Mis - son Anticipated Caregiver's Contact Information: [name] - son (c) [phone] Ability/Limitations of Caregiver: Daughter lives 30 mins away. Son lives with patient and can assist. Caregiver Availability: 24/7 Discharge Plan Discussed with Primary Caregiver: Yes Is Caregiver In Agreement with Plan?: Yes Does Caregiver/Family have Issues with Lodging/Transportation while Pt is in Rehab?: No  Goals/Additional Needs Patient/Family Goal for Rehab: PT/OT supervision and min assist goals Expected length of stay: 16-19 days Cultural Considerations: Attends [employer] - nondenominational church Dietary Needs: Heart healthy, carb modified, thin liquids Equipment Needs: TBD Pt/Family Agrees to Admission and willing to participate: Yes Program Orientation Provided & Reviewed with Pt/Caregiver Including Roles & Responsibilities: Yes  Decrease burden of Care through IP rehab admission: N/A  Possible need for SNF placement upon discharge: Not anticipated  Patient Condition: This patient's medical and functional status has changed since the consult dated: 04/02/16 in  which the Rehabilitation Physician determined and documented that the patient's condition is appropriate for intensive rehabilitative care in an inpatient rehabilitation facility. See "History of Present Illness" (above) for medical update. Functional changes are: Currently requiring mod/max assist for transfers and then min assist to ambulate 12 feet RW. Patient's medical and functional status update has been discussed with the Rehabilitation physician and patient remains appropriate for inpatient rehabilitation. Will admit to inpatient rehab today.  Preadmission Screen Completed By: [name], 04/04/2016 10:02 AM ______________________________________________________________________  Discussed status with Dr. Posey Pronto on 04/04/16 at 32 and received telephone approval for admission today.  Admission Coordinator: [name], time1005/Date05/24/17          Cosigned by: Ankit Lorie Phenix, MD at 04/04/2016 10:18 AM  Revision History     Date/Time User Provider Type Action   04/04/2016 10:18 AM Ankit Lorie Phenix, MD Physician Cosign   04/04/2016 10:05 AM [name] Diones, RN Rehab Admission Coordinator Sign

## 2016-04-04 NOTE — Progress Notes (Signed)
Patient is discharged from room 5C20 at this time. Alert an din stable condition. Transferred to unit 4W23. IV site d/c'd as well as tele. Report given to receiving nurse Angie, RN and all questions answered. Transported via bed with son and all belongings at side.

## 2016-04-04 NOTE — Progress Notes (Signed)
Subjective: No events overnight. Patient reports bilateral hand numbness unchanged from previous. Denies any dysuria. Did have a fever to 100.8 overnght. Denies feeling febrile, any chills, nausea, vomiting or any other symptoms. Does report not drinking as much fluids because she does not want to get up to use the bathroom with the weakness. Reports feeling her weakness is improving.   Objective: Vital signs in last 24 hours: Filed Vitals:   04/03/16 2224 04/04/16 0124 04/04/16 0504 04/04/16 0906  BP: 108/32 104/31 114/27 117/48  Pulse: 78 74 71 79  Temp: 100.8 F (38.2 C) 98.8 F (37.1 C) 98.8 F (37.1 C) 98.3 F (36.8 C)  TempSrc: Oral Oral Oral Oral  Resp: 18 18 18 18   Height:      Weight:      SpO2: 98% 98% 96% 97%   Weight change:   Intake/Output Summary (Last 24 hours) at 04/04/16 0955 Last data filed at 04/03/16 1700  Gross per 24 hour  Intake    600 ml  Output      0 ml  Net    600 ml     General: resting in bed in NAD HEENT: PERRL, EOMI, no scleral icterus, left lower facial droop resolved Cardiac: irregular distant HS, no rubs, murmurs or gallops Pulm: clear to auscultation bilaterally, no w/r/r, moving normal volumes of air Abd: soft, obese, nontender, nondistended, BS present Ext: warm and well perfused, 1+ B/L pedal edema, chronic venous skin changes.  Neuro: alert and oriented X3, cranial nerves II-XII grossly intact, 5/5 MMS RUE and RLE, 4+/5 LUE and LLE, sensation grossly equal and intact, normal Babinski.  Medications: I have reviewed the patient's current medications. Scheduled Meds: . atorvastatin  40 mg Oral q1800  . carvedilol  6.25 mg Oral BID WC  . cephALEXin  500 mg Oral Q12H  . docusate sodium  100 mg Oral BID  . furosemide  40 mg Oral Daily  . gabapentin  400 mg Oral QHS  . insulin aspart  0-9 Units Subcutaneous TID WC  . insulin glargine  18 Units Subcutaneous QHS  . sodium chloride flush  3 mL Intravenous Q12H  . traMADol  100 mg Oral  Daily  . traMADol  50 mg Oral QHS   Continuous Infusions: . sodium chloride Stopped (03/30/16 1625)  . sodium chloride 100 mL/hr at 03/31/16 1848   PRN Meds:.acetaminophen, sodium chloride flush Assessment/Plan:  CVA right MCA infarcts with hemorrhagic transformation: MRI shows acute infarct of the right caudate, lentiform nucleus, insular cortex and portions of the right temporal lobe. Small hemorrhagic conversion within the right basal ganglia infarcts. Repeat head CT showed stable hemorrhagic area.  CVA likely secondary to emboli from her a. Fib.  -BP soft, holding home losartan -Will continue to hold ASA and also anticoag for now. Plan to repeat CT head in a week and consider starting DOAC vs Warfarin at that time if no further hemorrhagic changes.  -Continue Lipitor 40 mg daily -discharge pending bed placement CIR  UTI with nitrite + UA on admission: Temp to 100.8 yesterday. Resolved with no anti-pyretics. No further fevers overnight or this morning.  Denies any other symptoms.  -Ceftriaxone 5/21 >> 5/23 -Start Keflex today to finish 7 day course, end date 5/27  Peramanent fibrillation on coumadin PTA: Rate controlled on BB. CHADSVASc score is 6 and she is on coumadin for A/C. INR was subtherapeutic at PCP earlier this week and this admission is 1.59.  - resumed bblocker  - hold  A/C as above  Diabetes: Reports compliance with Lantus 32 units qHS. A1c 8.1. CBG 82 this morning.  - Continue Lantus 18 units qHs - SSI  HTN: SBP 100-117 overnight. Currently on Carvedilol 6.25 mg bid and lasix 40 mg daily.  -Will hold lostartan  -Continue BB and Lasix -Check BMET in am  Hyperlipidemia: She reports compliance with simvastatin 40mg . Lipid panel  reveals LDL 53, HDL 39, TG 50, TChol 102.  -Lipitor 40 mg qhs  Coronary atherosclerosis: Stable. She has not needed to use SL NTG recently. EKG technically meets criteria for LBBB with broad R in I, aVL, V6 (mildly prolonged QRS  ~ 125) but morphology otherwise similar to prior EKG and she is asymptomatic w/ negative troponin so I do not suspect underlying ischemia. - hold ASA as above  Chronic systolic heart failure: Stable. EF 40-45% February 2014. EF 35-40% on ECHO here. Her cardiologist is Dr. Angelena Form and she was last seen in Dec 2016. She reports stable symptoms. She is compliant with her med regimen including BB and ARB.  - hold ARB . Resumed BBLocker. Continue lasix 40 mg daily.  CKD3: Stable. Per Care Everywhere record, her baseline Cr appears to be 1.2-1.4. She is at baseline currently. Her nephrologist is Dr. Audie Clear at [facility].  Bilateral hand tingling: Patient reports symptoms for the past 2-3 months. Negative tinel's sign and Phalen's sign today. Intact sensation. Would be unusual for DM neuropathy or B12 def to present in UE initially with no LE symptoms. She does have a history of thoracic spine pain with chronic changes. Possible that she has C-spine changes that are causing the symptoms. Would recommend EMG testing done out patient.   Diet:  carb modified diet  VTE ppx: SCDs.  Code status: Full  Dispo: Discharge to CIR vs SNF pending bed availability   The patient does have a current PCP (Daphene Jaeger, PA-C) and does need an [facility] hospital follow-up appointment after discharge.  The patient does not have transportation limitations that hinder transportation to clinic appointments.  .Services Needed at time of discharge: Y = Yes, Blank = No PT:   OT:   RN:   Equipment:   Other:     LOS: 4 days   Maryellen Pile, MD  04/04/2016, 9:55 AM

## 2016-04-04 NOTE — Progress Notes (Signed)
  Date: 04/04/2016  Patient name: [name]  Medical record number: [id number]  Date of birth: [birth year]   This patient has been seen and the plan of care was discussed with the house staff. Please see Dr. Shanna Cisco note for complete details. I concur with his findings.  Sid Falcon, MD 04/04/2016, 3:19 PM

## 2016-04-04 NOTE — Care Management Note (Signed)
Case Management Note  Patient Details  Name: [name] MRN: [id number] Date of Birth: [birth year]  Subjective/Objective:                    Action/Plan: Plan is for patient to d/c to CIR today. No further needs per CM.   Expected Discharge Date:                  Expected Discharge Plan:  [place]  In-House Referral:     Discharge planning Services     Post Acute Care Choice:    Choice offered to:     DME Arranged:    DME Agency:     HH Arranged:    [place] Agency:     Status of Service:  Completed, signed off  Medicare Important Message Given:  Yes Date Medicare IM Given:    Medicare IM give by:    Date Additional Medicare IM Given:    Additional Medicare Important Message give by:     If discussed at [place] of Stay Meetings, dates discussed:    Additional Comments:  Pollie Friar, RN 04/04/2016, 11:38 AM

## 2016-04-04 NOTE — H&P (Signed)
Physical Medicine and Rehabilitation Admission H&P    Chief Complaint  Patient presents with  . Cerebrovascular Accident  . Facial Droop  . Extremity Weakness    LUE & LLE  : HPI: [name] is a 75 y.o. right handed female with history of CAD status post CABG, ischemic cardiomyopathy, hypertension, diastolic congestive heart failure, diabetes mellitus peripheral neuropathy,Chronic renal insufficiency baseline creatinine 1.3-1.5, atrial fibrillation maintained on chronic Coumadin And aspirin. Per chart review patient lives with son. Ambulating short distances prior to admission using a rolling walker. One level home. Son is only available in the evenings, but grand daughter may be available during the day. Presented 03/30/2016 with sudden onset of left-sided weakness and facial droop with slurred speech. Cranial CT scan showed focal hypodensity within the right basal ganglia highly suspicious for evolving ischemia. Possible extension into the right subinsular region. Remote pontine infarct. Patient did not receive TPA. INR on admission of 1.59. CT angiogram head and neck showed right proximal M2 occlusion with distal reconstitution. There was completed infarct in the right caudate, putamen, internal capsule and posterior insula. Echocardiogram with ejection fraction of 40% diffuse hypokinesis. MRI/CT 03/31/2016 of the brain showed right MCA with hemorrhagic transformation felt to be embolic secondary to atrial fibrillation. Neurology follow-up hold on antiplatelet and eliquis for now. Recommendations are to begin [facility] after repeat CT of head at day 7 post stroke. Urine study positive nitrite And greater than 100,000 Escherichia coli With intravenous Rocephin completed 04/03/2016. Tolerating a regular consistency diet.Patient was admitted for a comprehensive rehabilitation program  ROS Constitutional: Negative for fever and chills.  HENT: Negative for hearing loss.  Eyes: Negative for  blurred vision and double vision.  Respiratory: Negative for cough.   Increased shortness of breath with exertion  Cardiovascular: Positive for palpitations and leg swelling. Negative for chest pain.  Gastrointestinal: Positive for constipation. Negative for nausea and vomiting.  Genitourinary: Positive for urgency. Negative for dysuria and hematuria.  Musculoskeletal: Positive for myalgias and back pain.  Skin: Negative for rash.  Neurological: Positive for weakness. Negative for seizures and headaches.  All other systems reviewed and are negative   Past Medical History  Diagnosis Date  . CAD (coronary artery disease) of artery bypass graft 05/2008    S/P CABG in 2009  . Ischemic cardiomyopathy   . Systolic heart failure   . Atrial fibrillation ([place])     Postoperative in 2009;  S/P transesophageal echocardiography-guided cardioversion, previously on Amiodarone and Coumadin; recurrent in April 2013  . Hyperlipidemia   . Chronic anticoagulation   . Obesities, morbid ([place])   . Hypertension   . Heart murmur   . CHF (congestive heart failure) ([place])   . Type II diabetes mellitus ([place])   . TIA (transient ischemic attack) 03/30/2016  . Arthritis     "qwhere" (03/30/2016)  . Chronic lower back pain   . Chronic kidney disease (CKD), stage III (moderate)    Past Surgical History  Procedure Laterality Date  . Ventral hernia repair  10/1999    With mesh  . Total knee arthroplasty Right 1994  . Appendectomy  1946  . Joint replacement    . Hernia repair    . Cataract extraction Right 2012  . Coronary angioplasty with stent placement  2009    "put 2 stents in"  . Tee with cardioversion      Postoperative in 2009;  S/P transesophageal echocardiography-guided cardioversion,   Family History  Problem Relation Age of  Onset  . Clotting disorder Mother     Cerebral hemorrhage  . Emphysema Father     COD  . Lung cancer Brother   . Heart disease Neg Hx    Social History:  reports  that she has never smoked. She has never used smokeless tobacco. She reports that she does not drink alcohol or use illicit drugs. Allergies:  Allergies  Allergen Reactions  . Benazepril Other (See Comments)  . Fe-Succ-C-Thre-B12-Des Stomach Other (See Comments)    Pt doesn't remember   . Spironolactone Other (See Comments)    Dizziness   . Sulfamethoxazole-Trimethoprim Other (See Comments)    Pt doesn't remember   Medications Prior to Admission  Medication Sig Dispense Refill  . aspirin EC 81 MG tablet Take 81 mg by mouth daily.    . carvedilol (COREG) 6.25 MG tablet Take 1 tablet (6.25 mg total) by mouth 2 (two) times daily with a meal. 60 tablet 6  . cholecalciferol (VITAMIN D) 1000 UNITS tablet Take 1,000 Units by mouth 2 (two) times daily.    Marland Kitchen docusate sodium (COLACE) 100 MG capsule Take 100 mg by mouth 2 (two) times daily.     . furosemide (LASIX) 40 MG tablet Take 60 mg by mouth daily.     Marland Kitchen gabapentin (NEURONTIN) 400 MG capsule Take 400 mg by mouth at bedtime.    . insulin glargine (LANTUS) 100 UNIT/ML injection Inject 32 Units into the skin at bedtime.     Marland Kitchen losartan (COZAAR) 25 MG tablet Take 25 mg by mouth daily.      . nitroGLYCERIN (NITROSTAT) 0.4 MG SL tablet Place 0.4 mg under the tongue every 5 (five) minutes as needed. May repeat for up to 3 doses.     . potassium chloride SA (K-DUR,KLOR-CON) 20 MEQ tablet Take 20 mEq by mouth daily.      Marland Kitchen rOPINIRole (REQUIP) 0.5 MG tablet Take 1 mg by mouth at bedtime.    . simvastatin (ZOCOR) 40 MG tablet Take 40 mg by mouth at bedtime.      . traMADol (ULTRAM) 50 MG tablet Take 50-100 mg by mouth 2 (two) times daily. Take 2 tablets every morning and take 1 tablet at night    . vitamin B-12 (CYANOCOBALAMIN) 1000 MCG tablet Take 1,000 mcg by mouth daily.    Marland Kitchen warfarin (COUMADIN) 1 MG tablet Take 1 mg by mouth daily.    Marland Kitchen warfarin (COUMADIN) 5 MG tablet Take 5 mg by mouth daily. As prescribed by coumadin clinic      Home: Home  Living Family/patient expects to be discharged to:: Inpatient rehab Living Arrangements: Children Available Help at Discharge: Family (son only available in evenings) Type of Home: House Home Layout: One level Home Equipment: Environmental consultant - 4 wheels  Lives With: Son   Functional History: Prior Function Level of Independence: Needs assistance Gait / Transfers Assistance Needed: per son and patient could ambulate and transfer independently, used rollator for [employer] ADL's / Homemaking Assistance Needed: Aide in 2x/week to assist with bathing  Functional Status:  Mobility: Bed Mobility Overal bed mobility: Needs Assistance Bed Mobility: Supine to Sit Supine to sit: Mod assist, +2 for physical assistance, HOB elevated General bed mobility comments: Assist with scooting bottom, to elevate trunk and move LLE to EOB. Increased time and cues.  Transfers Overall transfer level: Needs assistance Equipment used: Rolling walker (2 wheeled) Transfers: Sit to/from Stand Sit to Stand: Mod assist, Max assist Stand pivot transfers: Min assist, +2 physical  assistance General transfer comment: Mod-Max A to stand from EOB x1, from toilet x2. Cues for hand placement/technique and use of forward momentum. transferred to chair post ambulation bout. Ambulation/Gait Ambulation/Gait assistance: Min assist, +2 physical assistance Ambulation Distance (Feet): 12 Feet (x2 bouts) Assistive device: Rolling walker (2 wheeled) (bari RW) Gait Pattern/deviations: Step-to pattern, Decreased stance time - left, Decreased step length - right, Trunk flexed, Wide base of support General Gait Details: Cues for RW proximity/management. Some stability needed for left knee during stance phase but no knee buckling noted.  Gait velocity: decreased    ADL: ADL Overall ADL's : Needs assistance/impaired Eating/Feeding: Independent, Sitting Grooming: Set up, Sitting, Supervision/safety Upper Body Bathing: Minimal assitance,  Sitting Lower Body Bathing: Maximal assistance, Sit to/from stand Upper Body Dressing : Minimal assistance, Sitting Lower Body Dressing: Maximal assistance, Sit to/from stand Lower Body Dressing Details (indicate cue type and reason): Pt unable to reach feet. Required assist to start underwear over feet and assist to pull up once in standing. Toilet Transfer: Moderate assistance, +2 for physical assistance, Ambulation, BSC, RW (BSC over toilet) Toileting- Clothing Manipulation and Hygiene: Moderate assistance, Sit to/from stand, +2 for physical assistance Toileting - Clothing Manipulation Details (indicate cue type and reason): +2 for sit to stand. Min guard for toilet hygiene in sitting. Mod assist for clothing Functional mobility during ADLs: Moderate assistance, +2 for physical assistance, Rolling walker General ADL Comments: Attempted mobility with bari rollator since pt uses one at home; feel pt would be safer with bari RW.  Cognition: Cognition Overall Cognitive Status: Within Functional Limits for tasks assessed Orientation Level: Oriented X4 Comments: 26/30 on MoCA Cognition Arousal/Alertness: Awake/alert Behavior During Therapy: WFL for tasks assessed/performed Overall Cognitive Status: Within Functional Limits for tasks assessed  Physical Exam: Blood pressure 110/40, pulse 64, temperature 98.4 F (36.9 C), temperature source Oral, resp. rate 18, height '5\' 8"'$  (1.727 m), weight 136.079 kg (300 lb), SpO2 94 %. Physical Exam Constitutional: She is oriented to person, place, and time. She appears well-developed.  75 year old right-handed obese female  HENT:  Head: Normocephalic and atraumatic.  Eyes: Conjunctivae and EOM are normal.  Neck: Normal range of motion. Neck supple. No thyromegaly present.  Cardiovascular:  Irregulary irregular  Respiratory: Effort normal and breath sounds normal. No respiratory distress.  GI: Soft. Bowel sounds are normal. She exhibits no  distension.  Musculoskeletal: She exhibits mild edema. She exhibits no tenderness.  Neurological: She is alert and oriented to person, place, and time.  Follows commands.  Fair awareness of deficits. DTRs symmetric Sensation diminished to light touch in b/l feet ?Mild right facial weakness Motor: RUE/RLE: 5/5 proximal to distal LUE: 4+/5 proximal to distal LLE: 4-/5 proximally (baseline), 4+/5 distally  Skin: Skin is warm and dry.  Psychiatric: She has a normal mood and affect. Her behavior is normal   Results for orders placed or performed during the hospital encounter of 03/30/16 (from the past 48 hour(s))  Glucose, capillary     Status: Abnormal   Collection Time: 04/01/16  4:36 PM  Result Value Ref Range   Glucose-Capillary 221 (H) 65 - 99 mg/dL  Glucose, capillary     Status: Abnormal   Collection Time: 04/01/16 10:01 PM  Result Value Ref Range   Glucose-Capillary 188 (H) 65 - 99 mg/dL  Basic metabolic panel     Status: Abnormal   Collection Time: 04/02/16  4:05 AM  Result Value Ref Range   Sodium 136 135 - 145 mmol/L   Potassium  3.8 3.5 - 5.1 mmol/L   Chloride 103 101 - 111 mmol/L   CO2 26 22 - 32 mmol/L   Glucose, Bld 135 (H) 65 - 99 mg/dL   BUN 25 (H) 6 - 20 mg/dL   Creatinine, Ser 9.96 (H) 0.44 - 1.00 mg/dL   Calcium 8.7 (L) 8.9 - 10.3 mg/dL   GFR calc non Af Amer 41 (L) >60 mL/min   GFR calc Af Amer 48 (L) >60 mL/min    Comment: (NOTE) The eGFR has been calculated using the CKD EPI equation. This calculation has not been validated in all clinical situations. eGFR's persistently <60 mL/min signify possible Chronic Kidney Disease.    Anion gap 7 5 - 15  Glucose, capillary     Status: Abnormal   Collection Time: 04/02/16  7:10 AM  Result Value Ref Range   Glucose-Capillary 119 (H) 65 - 99 mg/dL   Comment 1 Notify RN    Comment 2 Document in Chart   Glucose, capillary     Status: Abnormal   Collection Time: 04/02/16 12:04 PM  Result Value Ref Range    Glucose-Capillary 123 (H) 65 - 99 mg/dL  Glucose, capillary     Status: Abnormal   Collection Time: 04/02/16  4:16 PM  Result Value Ref Range   Glucose-Capillary 173 (H) 65 - 99 mg/dL  Glucose, capillary     Status: Abnormal   Collection Time: 04/02/16 10:08 PM  Result Value Ref Range   Glucose-Capillary 170 (H) 65 - 99 mg/dL   Comment 1 Notify RN    Comment 2 Document in Chart   Basic metabolic panel     Status: Abnormal   Collection Time: 04/03/16  5:29 AM  Result Value Ref Range   Sodium 134 (L) 135 - 145 mmol/L   Potassium 3.8 3.5 - 5.1 mmol/L   Chloride 101 101 - 111 mmol/L   CO2 27 22 - 32 mmol/L   Glucose, Bld 119 (H) 65 - 99 mg/dL   BUN 31 (H) 6 - 20 mg/dL   Creatinine, Ser 5.73 (H) 0.44 - 1.00 mg/dL   Calcium 8.5 (L) 8.9 - 10.3 mg/dL   GFR calc non Af Amer 37 (L) >60 mL/min   GFR calc Af Amer 43 (L) >60 mL/min    Comment: (NOTE) The eGFR has been calculated using the CKD EPI equation. This calculation has not been validated in all clinical situations. eGFR's persistently <60 mL/min signify possible Chronic Kidney Disease.    Anion gap 6 5 - 15  Glucose, capillary     Status: Abnormal   Collection Time: 04/03/16  6:31 AM  Result Value Ref Range   Glucose-Capillary 115 (H) 65 - 99 mg/dL   Comment 1 Notify RN    Comment 2 Document in Chart   Glucose, capillary     Status: Abnormal   Collection Time: 04/03/16 11:24 AM  Result Value Ref Range   Glucose-Capillary 147 (H) 65 - 99 mg/dL   No results found.     Medical Problem List and Plan: 1.  Left hemiparesis, facial droop  secondary to right MCA infarct with hemorrhagic transformation felt to be embolic secondary to atrial fibrillation. Plan to begin [facility] after repeat CT of the head 04/07/2016 post stroke 2.  DVT Prophylaxis/Anticoagulation: SCDs. Monitor for any signs of DVT 3. Pain Management/chronic back pain: Neurontin 400 mg daily at bedtime, Tramadol 100 mg daily 50 mg at bedtime as prior to  admission 4. Hypertension. Coreg 6.25 mg  twice a day, Lasix 40 mg daily. Monitor with increased mobility 5. Neuropsych: This patient is capable of making decisions on his own behalf. 6. Skin/Wound Care: Routine skin checks 7. Fluids/Electrolytes/Nutrition: Routine I&O's with follow-up chemistries 8. Atrial fibrillation. Coumadin discontinued due to hemorrhagic transformation. Plan to begin ELIQUIS day 7 post stroke. Cardiac rate controlled 9. Diabetes mellitus of peripheral neuropathy. Hemoglobin A1c 8.1. Lantus insulin 18 units daily. Check blood sugars before meals and at bedtime. Diabetic teaching 10. Diastolic congestive heart failure. Weigh patient daily. Continue Lasix 40 mg daily. Monitor for any signs of fluid overload 11. CAD status post CABG. Aspirin and Coumadin discontinued due to hemorrhagic conversion. No chest pain or shortness of breath. 12. Chronic renal insufficiency. Creatinine 1.3-1.5. Follow-up chemistries 13. Escherichia coli urinary tract infection. Intravenous Rocephin completed 04/03/2016 14. Hyperlipidemia. Lipitor    Post Admission Physician Evaluation: Functional deficits secondary  to right MCA infarct with hemorrhagic transformation. 1. Patient is admitted to receive collaborative, interdisciplinary care between the physiatrist, rehab nursing staff, and therapy team. 2. Patient's level of medical complexity and substantial therapy needs in context of that medical necessity cannot be provided at a lesser intensity of care such as a SNF. 3. Patient has experienced substantial functional loss from his/her baseline which was documented above under the "Functional History" and "Functional Status" headings.  Judging by the patient's diagnosis, physical exam, and functional history, the patient has potential for functional progress which will result in measurable gains while on inpatient rehab.  These gains will be of substantial and practical use upon discharge  in  facilitating mobility and self-care at the household level. 4. Physiatrist will provide 24 hour management of medical needs as well as oversight of the therapy plan/treatment and provide guidance as appropriate regarding the interaction of the two. 5. 24 hour rehab nursing will assist with safety, skin/wound care, disease management, medication administration and patient education  and help integrate therapy concepts, techniques,education, etc. 6. PT will assess and treat for/with: Lower extremity strength, range of motion, stamina, balance, functional mobility, safety, adaptive techniques and equipment, coping skills, pain control, stroke education.   Goals are: Supervision/Min A. 7. OT will assess and treat for/with: ADL's, functional mobility, safety, upper extremity strength, adaptive techniques and equipment, ego support, and community reintegration.   Goals are: Supervision/Min A. Therapy may proceed with showering this patient. 8. Case Management and Social Worker will assess and treat for psychological issues and discharge planning. 9. Team conference will be held weekly to assess progress toward goals and to determine barriers to discharge. 10. Patient will receive at least 3 hours of therapy per day at least 5 days per week. 11. ELOS: 16-19 days.       12. Prognosis:  good  Delice Lesch, MD 04/03/2016

## 2016-04-05 ENCOUNTER — Inpatient Hospital Stay (HOSPITAL_COMMUNITY): Payer: Medicare Other | Admitting: Physical Therapy

## 2016-04-05 ENCOUNTER — Inpatient Hospital Stay (HOSPITAL_COMMUNITY): Payer: Medicare Other | Admitting: Occupational Therapy

## 2016-04-05 LAB — COMPREHENSIVE METABOLIC PANEL
ALBUMIN: 2.6 g/dL — AB (ref 3.5–5.0)
ALK PHOS: 120 U/L (ref 38–126)
ALT: 18 U/L (ref 14–54)
ANION GAP: 8 (ref 5–15)
AST: 31 U/L (ref 15–41)
BILIRUBIN TOTAL: 0.8 mg/dL (ref 0.3–1.2)
BUN: 34 mg/dL — AB (ref 6–20)
CO2: 27 mmol/L (ref 22–32)
CREATININE: 1.18 mg/dL — AB (ref 0.44–1.00)
Calcium: 9 mg/dL (ref 8.9–10.3)
Chloride: 102 mmol/L (ref 101–111)
GFR calc non Af Amer: 43 mL/min — ABNORMAL LOW (ref >60)
GFR, EST AFRICAN AMERICAN: 50 mL/min — AB (ref >60)
Glucose, Bld: 139 mg/dL — ABNORMAL HIGH (ref 65–99)
POTASSIUM: 3.7 mmol/L (ref 3.5–5.1)
SODIUM: 137 mmol/L (ref 135–145)
Total Protein: 6.3 g/dL — ABNORMAL LOW (ref 6.5–8.1)

## 2016-04-05 LAB — CBC WITH DIFFERENTIAL/PLATELET
BASOS ABS: 0 10*3/uL (ref 0.0–0.1)
BASOS PCT: 0 %
EOS ABS: 0.4 10*3/uL (ref 0.0–0.7)
EOS PCT: 6 %
HCT: 35.2 % — ABNORMAL LOW (ref 36.0–46.0)
Hemoglobin: 11.2 g/dL — ABNORMAL LOW (ref 12.0–15.0)
LYMPHS PCT: 20 %
Lymphs Abs: 1.3 10*3/uL (ref 0.7–4.0)
MCH: 28.7 pg (ref 26.0–34.0)
MCHC: 31.8 g/dL (ref 30.0–36.0)
MCV: 90.3 fL (ref 78.0–100.0)
MONO ABS: 1 10*3/uL (ref 0.1–1.0)
Monocytes Relative: 15 %
Neutro Abs: 4 10*3/uL (ref 1.7–7.7)
Neutrophils Relative %: 59 %
Platelets: 151 10*3/uL (ref 150–400)
RBC: 3.9 MIL/uL (ref 3.87–5.11)
RDW: 14.9 % (ref 11.5–15.5)
WBC: 6.8 10*3/uL (ref 4.0–10.5)

## 2016-04-05 LAB — GLUCOSE, CAPILLARY
GLUCOSE-CAPILLARY: 121 mg/dL — AB (ref 65–99)
Glucose-Capillary: 159 mg/dL — ABNORMAL HIGH (ref 65–99)
Glucose-Capillary: 157 mg/dL — ABNORMAL HIGH (ref 65–99)
Glucose-Capillary: 178 mg/dL — ABNORMAL HIGH (ref 65–99)

## 2016-04-05 NOTE — Evaluation (Signed)
Physical Therapy Assessment and Plan  Patient Details  Name: [name] MRN: [id number] Date of Birth: 15-Apr-1941  PT Diagnosis: Abnormal posture, Abnormality of gait, Difficulty walking, Hemiparesis non-dominant, Low back pain and Muscle weakness Rehab Potential: Good ELOS: 10-12 days   Today's Date: 04/05/2016 PT Individual Time: 1415-1530 PT Individual Time Calculation (min): 75 min    Problem List:  Patient Active Problem List   Diagnosis Date Noted  . Right middle cerebral artery stroke ([place]) 04/04/2016  . Facial weakness, post-stroke   . Hemiparesis ([place])   . Persistent atrial fibrillation ([place])   . Chronic pain syndrome   . HLD (hyperlipidemia)   . Chronic diastolic congestive heart failure ([place])   . Coronary artery disease involving coronary bypass graft of native heart without angina pectoris   . CKD (chronic kidney disease)   . E-coli UTI   . Chronic combined systolic and diastolic congestive heart failure ([place])   . Benign essential HTN   . DM type 2 with diabetic peripheral neuropathy ([place])   . Tachypnea   . Thrombocytopenia ([place])   . UTI (urinary tract infection) 04/01/2016  . Acute CVA (cerebrovascular accident) ([place]) 03/31/2016  . Acute hemorrhagic infarction of brain ([place])   . Cerebrovascular accident (CVA) due to embolism of right middle cerebral artery ([place])   . Subtherapeutic international normalized ratio (INR)   . CVA (cerebral vascular accident) ([place]) 03/30/2016  . CKD (chronic kidney disease) stage 3, GFR 30-59 ml/min 03/30/2016  . Chronic anticoagulation 03/30/2016  . Diabetes ([place]) 01/31/2009  . Hyperlipidemia 01/31/2009  . ANXIETY 01/31/2009  . Coronary atherosclerosis 01/31/2009  . CAD, AUTOLOGOUS BYPASS GRAFT 01/31/2009  . CARDIOMYOPATHY, ISCHEMIC 01/31/2009  . ATRIAL FIBRILLATION 01/31/2009  . SYSTOLIC HEART FAILURE, CHRONIC 01/31/2009  . TACHYCARDIA, HX OF 01/31/2009    Past Medical History:  Past Medical History  Diagnosis Date  .  CAD (coronary artery disease) of artery bypass graft 05/2008    S/P CABG in 2009  . Ischemic cardiomyopathy   . Systolic heart failure   . Atrial fibrillation ([place])     Postoperative in 2009;  S/P transesophageal echocardiography-guided cardioversion, previously on Amiodarone and Coumadin; recurrent in April 2013  . Hyperlipidemia   . Chronic anticoagulation   . Obesities, morbid ([place])   . Hypertension   . Heart murmur   . CHF (congestive heart failure) ([place])   . Type II diabetes mellitus ([place])   . TIA (transient ischemic attack) 03/30/2016  . Arthritis     "qwhere" (03/30/2016)  . Chronic lower back pain   . Chronic kidney disease (CKD), stage III (moderate)    Past Surgical History:  Past Surgical History  Procedure Laterality Date  . Ventral hernia repair  10/1999    With mesh  . Total knee arthroplasty Right 1994  . Appendectomy  1946  . Joint replacement    . Hernia repair    . Cataract extraction Right 2012  . Coronary angioplasty with stent placement  2009    "put 2 stents in"  . Tee with cardioversion      Postoperative in 2009;  S/P transesophageal echocardiography-guided cardioversion,    Assessment & Plan Clinical Impression: A 75 y.o. right handed female with history of CAD status post CABG, ischemic cardiomyopathy, hypertension, diastolic congestive heart failure, diabetes mellitus peripheral neuropathy,Chronic renal insufficiency baseline creatinine 1.3-1.5, atrial fibrillation maintained on chronic Coumadin and aspirin. Per chart review patient lives with son. Ambulating short distances prior to admission using a rolling walker.  One level home. Son is only available in the evenings, but grand daughter may be available during the day. Presented 03/30/2016 with sudden onset of left-sided weakness and facial droop with slurred speech. Cranial CT scan showed focal hypodensity within the right basal ganglia highly suspicious for evolving ischemia. Possible extension into  the right subinsular region. Remote pontine infarct. Patient did not receive TPA. INR on admission of 1.59. CT angiogram head and neck showed right proximal M2 occlusion with distal reconstitution. There was completed infarct in the right caudate, putamen, internal capsule and posterior insula. Echocardiogram with ejection fraction of 40% diffuse hypokinesis. MRI/CT 03/31/2016 of the brain showed right MCA with hemorrhagic transformation felt to be embolic secondary to atrial fibrillation. Neurology follow-up hold on antiplatelet and eliquis for now. Recommendations are to begin [facility] after repeat CT of head at day 7 post stroke. Urine study positive nitrite And greater than 100,000 Escherichia coli With intravenous Rocephin completed 04/03/2016. Tolerating a regular consistency diet. Patient to be admitted for a comprehensive inpatient rehabilitation program.  Patient transferred to CIR on 04/04/2016 .   Patient currently requires min with mobility secondary to muscle weakness, decreased cardiorespiratoy endurance and decreased sitting balance, decreased standing balance, decreased postural control, hemiplegia and decreased balance strategies.  Prior to hospitalization, patient was modified independent  with mobility and lived with Son in a House home.  Home access is  Ramped entrance.  Patient will benefit from skilled PT intervention to maximize safe functional mobility, minimize fall risk and decrease caregiver burden for planned discharge home with intermittent assist.  Anticipate patient will benefit from follow up [facility] at discharge.  PT - End of Session Activity Tolerance: Tolerates 30+ min activity with multiple rests Endurance Deficit: Yes Endurance Deficit Description: requires rest breaks after short bouts of mobility PT Assessment Rehab Potential (ACUTE/IP ONLY): Good Barriers to Discharge: Decreased caregiver support Barriers to Discharge Comments: son not available during the day PT Patient  demonstrates impairments in the following area(s): Balance;Safety;Endurance;Motor;Pain PT Transfers Functional Problem(s): Bed Mobility;Bed to Chair;Car;Furniture PT Locomotion Functional Problem(s): Ambulation;Stairs PT Plan PT Intensity: Minimum of 1-2 x/day ,45 to 90 minutes PT Frequency: 5 out of 7 days PT Duration Estimated Length of Stay: 10-12 days PT Treatment/Interventions: Ambulation/gait training;Balance/vestibular training;Community reintegration;Discharge planning;Disease management/prevention;Neuromuscular re-education;Functional mobility training;Pain management;Patient/family education;Stair training;Therapeutic Activities;Therapeutic Exercise;UE/LE Coordination activities;UE/LE Strength taining/ROM;DME/adaptive equipment instruction PT Transfers Anticipated Outcome(s): mod I PT Locomotion Anticipated Outcome(s): mod I in home, S stairs and community mobility PT Recommendation Follow Up Recommendations: Home health PT Patient destination: Home Equipment Recommended: To be determined Equipment Details: has rollator   Skilled Therapeutic Intervention Pt received seated in tall back chair, c/o back pain and agreeable to treatment. Initial PT evaluation performed and completed. Assessed all mobility with min guard/minA as described below using rollator, and increased time due to fatigue. Educated pt in rehab process, goals, estimated LOS, safety and falls prevention with use of call bell before getting OOB or using restroom. Pt remained supine in bed at completion of session, all needs within reach.   PT Evaluation Precautions/Restrictions Precautions Precautions: Fall Precaution Comments: left knee weakness Restrictions Weight Bearing Restrictions: No General Chart Reviewed: Yes Response to Previous Treatment: Patient reporting fatigue but able to participate. Family/Caregiver Present: Yes  Pain Pain Assessment Pain Assessment: 0-10 Pain Score: 4  Pain Type: Chronic  pain Pain Location: Back Pain Orientation: Mid;Lower Pain Descriptors / Indicators: Aching Pain Onset: On-going Patients Stated Pain Goal: 2 Pain Intervention(s): Repositioned;Ambulation/increased activity Home Living/Prior Functioning Home  Living Living Arrangements: Children Available Help at Discharge: Family (son available in evenings) Type of Home: House Home Access: Ramped entrance Home Layout: One level  Lives With: Son Prior Function Level of Independence: Independent with basic ADLs;Requires assistive device for independence;Independent with transfers  Able to Take Stairs?: No Driving: Yes Vocation: Unemployed Leisure: Hobbies-yes (Comment) Comments: Goes out 3x/week for church, to friends house and to Emergency planning/management officer Vision/Perception    [employer] Cognition Overall Cognitive Status: Within Functional Limits for tasks assessed Arousal/Alertness: Awake/alert Orientation Level: Oriented X4 Safety/Judgment: Appears intact Sensation Sensation Light Touch: Appears Intact Stereognosis: Not tested Hot/Cold: Not tested Proprioception: Appears Intact Coordination Gross Motor Movements are Fluid and Coordinated: No Heel Shin Test: slow and decreased excursion Motor  Motor Motor - Skilled Clinical Observations: generalized weakness  Mobility Bed Mobility Bed Mobility: Supine to Sit Supine to Sit: 3: Mod assist Supine to Sit Details: Verbal cues for technique;Verbal cues for precautions/safety;Verbal cues for sequencing;Manual facilitation for placement Transfers Transfers: Yes Stand Pivot Transfers: 4: Min guard Locomotion  Ambulation Ambulation: Yes Ambulation/Gait Assistance: 4: Min guard Ambulation Distance (Feet): 160 Feet Assistive device: Rollator Gait Gait: Yes Gait Pattern: Impaired Gait Pattern: Decreased stride length;Poor foot clearance - left;Poor foot clearance - right;Antalgic;Trunk flexed Gait velocity: decreased for age/gender norms Stairs / Additional  Locomotion Stairs: Yes Stairs Assistance: 4: Min guard Stair Management Technique: Two rails;Step to pattern;Forwards Number of Stairs: 8 Height of Stairs: 3 Wheelchair Mobility Wheelchair Mobility: No  Trunk/Postural Assessment  Cervical Assessment Cervical Assessment: Exceptions to [facility] (forward head posture) Thoracic Assessment Thoracic Assessment: Exceptions to [facility] (increased kyphosis) Lumbar Assessment Lumbar Assessment: Exceptions to [facility] (reduced lumbar lordosis, posterior pelvic tilt) Postural Control Postural Control: Deficits on evaluation (decreased efficiency of stepping and righting reactions)  Balance Balance Balance Assessed: Yes Static Sitting Balance Static Sitting - Balance Support: No upper extremity supported;Feet supported Static Sitting - Level of Assistance: 6: Modified independent (Device/Increase time) Dynamic Sitting Balance Dynamic Sitting - Balance Support: No upper extremity supported;Feet supported Dynamic Sitting - Level of Assistance: 5: Stand by assistance Dynamic Sitting - Balance Activities: Lateral lean/weight shifting;Forward lean/weight shifting;Reaching across midline Static Standing Balance Static Standing - Balance Support: Bilateral upper extremity supported Static Standing - Level of Assistance: 5: Stand by assistance Dynamic Standing Balance Dynamic Standing - Balance Support: Bilateral upper extremity supported;During functional activity Dynamic Standing - Level of Assistance: 5: Stand by assistance Dynamic Standing - Balance Activities: Lateral lean/weight shifting;Forward lean/weight shifting;Reaching across midline Extremity Assessment  BUE: defer to OT exam RLE Assessment RLE Assessment: Within Functional Limits LLE Assessment LLE Assessment: Exceptions to [facility] (4+/5 to 5/5 throughout, however L knee extension strength limited by pain)   See Function Navigator for Current Functional Status.   Refer to Care Plan for Long Term  Goals  Recommendations for other services: None  Discharge Criteria: Patient will be discharged from PT if patient refuses treatment 3 consecutive times without medical reason, if treatment goals not met, if there is a change in medical status, if patient makes no progress towards goals or if patient is discharged from hospital.  The above assessment, treatment plan, treatment alternatives and goals were discussed and mutually agreed upon: by patient and by family  Luberta Mutter 04/05/2016, 4:12 PM

## 2016-04-05 NOTE — Progress Notes (Signed)
Occupational Therapy Session Note  Patient Details  Name: [name] MRN: [id number] Date of Birth: [birth year]  Today's Date: 04/05/2016 OT Individual Time: 1300-1400 OT Individual Time Calculation (min): 60 min    Short Term Goals: Week 1:  OT Short Term Goal 1 (Week 1): Pt will complete LB dressing with min assist OT Short Term Goal 2 (Week 1): Pt will complete walk-in shower transfers with min assist OT Short Term Goal 3 (Week 1): Pt will complete toilet transfers with min assist OT Short Term Goal 4 (Week 1): Pt will complete 2 grooming tasks in standing for increased activity tolerance  Skilled Therapeutic Interventions/Progress Updates:  Treatment session with focus on sit > stand and increased activity tolerance.  Pt received upright in arm chair in room.  Ambulated to toilet with Rollator and min assist.  Pt required assist with toileting hygiene again this session but completed clothing management.  Engaged [place] activity in standing with focus on sit > stand and increased standing tolerance.  Pt progressing from standing 5 mins x2 to 7 mins x2.  Utilized LUE at diminished level to flip and set out small tiles to engage in activity.  Pt able to complete sit > stand from arm chair with cushion with min assist progressing to min guard throughout session. Left in arm chair will all needs in reach.  Therapy Documentation Precautions:  Precautions Precautions: Fall Precaution Comments: left knee weakness Restrictions Weight Bearing Restrictions: No General:   Vital Signs: Therapy Vitals Temp: 98.4 F (36.9 C) Temp Source: Oral Pulse Rate: 73 Resp: 16 BP: (!) 131/56 mmHg Patient Position (if appropriate): Lying Oxygen Therapy SpO2: 96 % O2 Device: Not Delivered Pain: Pain Assessment Pain Assessment: 0-10 Pain Score: 4  Pain Type: Chronic pain Pain Location: Back Pain Orientation: Mid;Lower Pain Descriptors / Indicators: Aching Pain Onset: On-going Patients  Stated Pain Goal: 2 Pain Intervention(s): Repositioned;Ambulation/increased activity  See Function Navigator for Current Functional Status.   Therapy/Group: Individual Therapy  Simonne Come 04/05/2016, 5:56 PM

## 2016-04-05 NOTE — Progress Notes (Signed)
Social Work  Social Work Assessment and Plan  Patient Details  Name: [name] MRN: [id number] Date of Birth: Sep 17, 1941  Today's Date: 04/05/2016  Problem List:  Patient Active Problem List   Diagnosis Date Noted  . Right middle cerebral artery stroke ([place]) 04/04/2016  . Facial weakness, post-stroke   . Hemiparesis ([place])   . Persistent atrial fibrillation ([place])   . Chronic pain syndrome   . HLD (hyperlipidemia)   . Chronic diastolic congestive heart failure ([place])   . Coronary artery disease involving coronary bypass graft of native heart without angina pectoris   . CKD (chronic kidney disease)   . E-coli UTI   . Chronic combined systolic and diastolic congestive heart failure ([place])   . Benign essential HTN   . DM type 2 with diabetic peripheral neuropathy ([place])   . Tachypnea   . Thrombocytopenia ([place])   . UTI (urinary tract infection) 04/01/2016  . Acute CVA (cerebrovascular accident) ([place]) 03/31/2016  . Acute hemorrhagic infarction of brain ([place])   . Cerebrovascular accident (CVA) due to embolism of right middle cerebral artery ([place])   . Subtherapeutic international normalized ratio (INR)   . CVA (cerebral vascular accident) ([place]) 03/30/2016  . CKD (chronic kidney disease) stage 3, GFR 30-59 ml/min 03/30/2016  . Chronic anticoagulation 03/30/2016  . Diabetes ([place]) 01/31/2009  . Hyperlipidemia 01/31/2009  . ANXIETY 01/31/2009  . Coronary atherosclerosis 01/31/2009  . CAD, AUTOLOGOUS BYPASS GRAFT 01/31/2009  . CARDIOMYOPATHY, ISCHEMIC 01/31/2009  . ATRIAL FIBRILLATION 01/31/2009  . SYSTOLIC HEART FAILURE, CHRONIC 01/31/2009  . TACHYCARDIA, HX OF 01/31/2009   Past Medical History:  Past Medical History  Diagnosis Date  . CAD (coronary artery disease) of artery bypass graft 05/2008    S/P CABG in 2009  . Ischemic cardiomyopathy   . Systolic heart failure   . Atrial fibrillation ([place])     Postoperative in 2009;  S/P transesophageal echocardiography-guided  cardioversion, previously on Amiodarone and Coumadin; recurrent in April 2013  . Hyperlipidemia   . Chronic anticoagulation   . Obesities, morbid ([place])   . Hypertension   . Heart murmur   . CHF (congestive heart failure) ([place])   . Type II diabetes mellitus ([place])   . TIA (transient ischemic attack) 03/30/2016  . Arthritis     "qwhere" (03/30/2016)  . Chronic lower back pain   . Chronic kidney disease (CKD), stage III (moderate)    Past Surgical History:  Past Surgical History  Procedure Laterality Date  . Ventral hernia repair  10/1999    With mesh  . Total knee arthroplasty Right 1994  . Appendectomy  1946  . Joint replacement    . Hernia repair    . Cataract extraction Right 2012  . Coronary angioplasty with stent placement  2009    "put 2 stents in"  . Tee with cardioversion      Postoperative in 2009;  S/P transesophageal echocardiography-guided cardioversion,   Social History:  reports that she has never smoked. She has never used smokeless tobacco. She reports that she does not drink alcohol or use illicit drugs.  Family / Support Systems Marital Status: Widow/Widower Patient Roles: Parent Children: [name] [id number] Other Supports: [name]-daughter [id number] lives in [place] Anticipated Caregiver: [name] Ability/Limitations of Caregiver: Son is disabled and can assist if needed. He does like to go during the day and help his older friends. he is definitely there in the evenings. Caregiver Availability: Other (Comment) (can adjust schedule if needed.) Family Dynamics: Close  knit with two children had another son who was killed in a house fire in 2005. Pt wants to do what she can for herself and be as independent as possible. She was mobile before this stroke and hopes can get back to this level.  Social History Preferred language: English Religion: Non-Denominational Cultural Background: No issues Education: High School Read: Yes Write:  Yes Employment Status: Retired Freight forwarder Issues: No issues Guardian/Conservator: None-according to MD pt is capable of making her own decisions while here.    Abuse/Neglect Physical Abuse: Denies Verbal Abuse: Denies Sexual Abuse: Denies Exploitation of patient/patient's resources: Denies Self-Neglect: Denies  Emotional Status Pt's affect, behavior adn adjustment status: Pt reports she wants to recover and get back to her prior level. She wants to be walking with her rollator if possible. She is glad to be here and out of that bed, she knnows it is important to get up and moving so doesn;t lose her abilities.  Recent Psychosocial Issues: other health issues-were managed-back pain is long standing Pyschiatric History: No history deferred depression screen due to coping appropriately and optimistic regarding her improvement and being here on rehab instead of a NH. Will monitor while here and intervene if needed. Substance Abuse History: No issues  Patient / Family Perceptions, Expectations & Goals Pt/Family understanding of illness & functional limitations: Pt and son can explain her condition and talks with MD daily. They feel they have a good understanding of her condition and treatment plan. Both are not afraid to ask their questions. Concerned about the co-pay for Eliquis which is $100 for 30 days Premorbid pt/family roles/activities: Mother, retiree, grandmother, church member, etc Anticipated changes in roles/activities/participation: resume Pt/family expectations/goals: Pt states: " I want to be mobile again before I leave here and be able to stay by myself when [name] is gone." [name] states: " I hope she does well here and can be back to where she was before this stroke."  [employer]: Other (Comment) (had in past) Premorbid Home Care/DME Agencies: Other (Comment) (40 M & F to assist wiht bathing-[employer] in  [place]) Transportation available at discharge: Son and pt was driving some on her own Resource referrals recommended: Support group (specify)  Discharge Planning Living Arrangements: Children Support Systems: Children, Other relatives, Friends/neighbors, Social worker community Type of Residence: Private residence Insurance Resources: Multimedia programmer (specify) Artist) Financial Resources: [place], Family Support Financial Screen Referred: No Living Expenses: Own Money Management: Patient, Family Does the patient have any problems obtaining your medications?: Yes (Describe) (Worried about co-pay for Eliquis) Home Management: Both do and aide assists with also Patient/Family Preliminary Plans: Return home with son assisting if necessary he can adjust his schedule. Both hope she will get mobile with her rollator like she was prior to admission. Aware of team conference next Wed will work on discharge needs. Social Work Anticipated Follow Up Needs: HH/OP, Support Group  Clinical Impression Pleasant female who is willing to work on her functioning to return to her prior level. She does not want to lose her aide but will only hold her spot for two weeks. Son is supportive and willing to do what Mom needs. Will await team's evaluations and work on a safe discharge plan. Will resume pt's aide when aware of discharge date.  Elease Hashimoto 04/05/2016, 12:54 PM

## 2016-04-05 NOTE — Progress Notes (Addendum)
75 y.o. right handed female with history of CAD status post CABG, ischemic cardiomyopathy, hypertension, diastolic congestive heart failure, diabetes mellitus peripheral neuropathy,Chronic renal insufficiency baseline creatinine 1.3-1.5, atrial fibrillation maintained on chronic Coumadin And aspirin. Per chart review patient lives with son. Ambulating short distances prior to admission using a rolling walker. One level home. Son is only available in the evenings, but grand daughter may be available during the day. Presented 03/30/2016 with sudden onset of left-sided weakness and facial droop with slurred speech. Cranial CT scan showed focal hypodensity within the right basal ganglia highly suspicious for evolving ischemia. Possible extension into the right subinsular region. Remote pontine infarct. Patient did not receive TPA. INR on admission of 1.59. CT angiogram head and neck showed right proximal M2 occlusion with distal reconstitution. There was completed infarct in the right caudate, putamen, internal capsule and posterior insula. Echocardiogram with ejection fraction of 40% diffuse hypokinesis. MRI/CT 03/31/2016 of the brain showed right MCA with hemorrhagic transformation felt to be embolic secondary to atrial fibrillation. Neurology follow-up hold on antiplatelet and eliquis for now. Recommendations are to begin [facility] after repeat CT of head at day 7 post stroke.   Subjective/Complaints: No problems overnite, looking forward to therapy  ROS- neg CP/SOB/ , N/V/D  Objective: Vital Signs: Blood pressure 110/68, pulse 69, temperature 98.6 F (37 C), temperature source Oral, resp. rate 18, height _0  (1.727 m), weight 141.794 kg (312 lb 9.6 oz), SpO2 98 %. No results found. Results for orders placed or performed during the hospital encounter of 04/04/16 (from the past 72 hour(s))  Glucose, capillary     Status: None   Collection Time: 04/04/16  4:22 PM  Result Value Ref Range   Glucose-Capillary 79 65 - 99 mg/dL   Comment 1 Notify RN   Glucose, capillary     Status: Abnormal   Collection Time: 04/04/16  8:43 PM  Result Value Ref Range   Glucose-Capillary 134 (H) 65 - 99 mg/dL  CBC WITH DIFFERENTIAL     Status: Abnormal   Collection Time: 04/05/16  5:16 AM  Result Value Ref Range   WBC 6.8 4.0 - 10.5 K/uL   RBC 3.90 3.87 - 5.11 MIL/uL   Hemoglobin 11.2 (L) 12.0 - 15.0 g/dL   HCT 35.2 (L) 36.0 - 46.0 %   MCV 90.3 78.0 - 100.0 fL   MCH 28.7 26.0 - 34.0 pg   MCHC 31.8 30.0 - 36.0 g/dL   RDW 14.9 11.5 - 15.5 %   Platelets 151 150 - 400 K/uL   Neutrophils Relative % 59 %   Neutro Abs 4.0 1.7 - 7.7 K/uL   Lymphocytes Relative 20 %   Lymphs Abs 1.3 0.7 - 4.0 K/uL   Monocytes Relative 15 %   Monocytes Absolute 1.0 0.1 - 1.0 K/uL   Eosinophils Relative 6 %   Eosinophils Absolute 0.4 0.0 - 0.7 K/uL   Basophils Relative 0 %   Basophils Absolute 0.0 0.0 - 0.1 K/uL  Comprehensive metabolic panel     Status: Abnormal   Collection Time: 04/05/16  5:16 AM  Result Value Ref Range   Sodium 137 135 - 145 mmol/L   Potassium 3.7 3.5 - 5.1 mmol/L   Chloride 102 101 - 111 mmol/L   CO2 27 22 - 32 mmol/L   Glucose, Bld 139 (H) 65 - 99 mg/dL   BUN 34 (H) 6 - 20 mg/dL   Creatinine, Ser 1.18 (H) 0.44 - 1.00 mg/dL   Calcium 9.0  8.9 - 10.3 mg/dL   Total Protein 6.3 (L) 6.5 - 8.1 g/dL   Albumin 2.6 (L) 3.5 - 5.0 g/dL   AST 31 15 - 41 U/L   ALT 18 14 - 54 U/L   Alkaline Phosphatase 120 38 - 126 U/L   Total Bilirubin 0.8 0.3 - 1.2 mg/dL   GFR calc non Af Amer 43 (L) >60 mL/min   GFR calc Af Amer 50 (L) >60 mL/min    Comment: (NOTE) The eGFR has been calculated using the CKD EPI equation. This calculation has not been validated in all clinical situations. eGFR's persistently <60 mL/min signify possible Chronic Kidney Disease.    Anion gap 8 5 - 15  Glucose, capillary     Status: Abnormal   Collection Time: 04/05/16  6:56 AM  Result Value Ref Range   Glucose-Capillary  121 (H) 65 - 99 mg/dL     HEENT: normal Cardio: RRR and no murmur Resp: RRR and no murmur GI: BS positive and NT, ND Extremity:  Pulses positive and No Edema Skin:   Intact Neuro: Alert/Oriented, Normal Sensory, Abnormal Motor 3- bilateral HF, 4.5 B Knee ext and ankles, 5/5 in BUE and Abnormal FMC Ataxic/ dec FMC Musc/Skel:  Normal Gen NAD   Assessment/Plan: 1. Functional deficits secondary to Right MCA infarct which require 3+ hours per day of interdisciplinary therapy in a comprehensive inpatient rehab setting. Physiatrist is providing close team supervision and 24 hour management of active medical problems listed below. Physiatrist and rehab team continue to assess barriers to discharge/monitor patient progress toward functional and medical goals. FIM:       Function - Toileting Toileting steps completed by helper: Adjust clothing prior to toileting, Performs perineal hygiene, Adjust clothing after toileting Toileting Assistive Devices: Grab bar or rail Assist level: Set up/obtain supplies  Function - Toilet Transfers Toilet transfer assistive device: Bedside commode, Elevated toilet seat/BSC over toilet, Walker Assist level to toilet: Moderate assist (Pt 50 - 74%/lift or lower) Assist level to bedside commode (at bedside): Moderate assist (Pt 50 - 74%/lift or lower)           Function - Expression Expression: Verbal Expression assist level: Expresses basic needs/ideas: With no assist  Function - Social Interaction Social Interaction assist level: Interacts appropriately with others with medication or extra time (anti-anxiety, antidepressant).  Function - Problem Solving Problem solving assist level: Solves basic 90% of the time/requires cueing < 10% of the time  Function - Memory Memory assist level: More than reasonable amount of time Patient normally able to recall (first 3 days only): Current season, Location of own room, Staff names and faces, That he or she  is in a hospital   Medical Problem List and Plan: 1.  Left hemiparesis, facial droop  secondary to right MCA infarct with hemorrhagic transformation felt to be embolic secondary to atrial fibrillation. Plan to begin [facility] after repeat CT of the head 04/07/2016 post stroke, initiate CIR program today 2.  DVT Prophylaxis/Anticoagulation: SCDs. Monitor for any signs of DVT 3. Pain Management/chronic back pain: Neurontin 400 mg daily at bedtime, Tramadol 100 mg daily 50 mg at bedtime as prior to admission 4. Hypertension. Coreg 6.25 mg twice a day, Lasix 40 mg daily. Monitor with increased mobility 5. Neuropsych: This patient is capable of making decisions on his own behalf. 6. Skin/Wound Care: Routine skin checks 7. Fluids/Electrolytes/Nutrition: Routine I&O's with follow-up chemistries 8. Atrial fibrillation. Coumadin discontinued due to hemorrhagic transformation. Plan to begin ELIQUIS day  7 post stroke. Cardiac rate controlled 9. Diabetes mellitus of peripheral neuropathy. Hemoglobin A1c 8.1. Lantus insulin 18 units daily. Check blood sugars before meals and at bedtime. Diabetic teaching 10. Diastolic congestive heart failure. Weigh patient daily. Continue Lasix 40 mg daily. Monitor for any signs of fluid overload 11. CAD status post CABG. Aspirin and Coumadin discontinued due to hemorrhagic conversion. No chest pain or shortness of breath. 12. Chronic renal insufficiency. Creatinine 1.3-1.5.repeat 5/25 improved vs baseline 13. Escherichia coli urinary tract infection. Intravenous Rocephin completed 04/03/2016, monitor for reoccurance 14. Hyperlipidemia. Lipitor   LOS (Days) 1 A FACE TO FACE EVALUATION WAS PERFORMED  Aury Scollard E 04/05/2016, 7:36 AM

## 2016-04-05 NOTE — Evaluation (Signed)
Occupational Therapy Assessment and Plan  Patient Details  Name: [name] MRN: [id number] Date of Birth: 01-03-1941  OT Diagnosis: acute pain, hemiplegia affecting non-dominant side, lumbago (low back pain) and muscle weakness (generalized) Rehab Potential: Rehab Potential (ACUTE ONLY): Good ELOS: 10-12 days   Today's Date: 04/05/2016 OT Individual Time: 1000-1100 OT Individual Time Calculation (min): 60 min     Problem List:  Patient Active Problem List   Diagnosis Date Noted  . Right middle cerebral artery stroke ([place]) 04/04/2016  . Facial weakness, post-stroke   . Hemiparesis ([place])   . Persistent atrial fibrillation ([place])   . Chronic pain syndrome   . HLD (hyperlipidemia)   . Chronic diastolic congestive heart failure ([place])   . Coronary artery disease involving coronary bypass graft of native heart without angina pectoris   . CKD (chronic kidney disease)   . E-coli UTI   . Chronic combined systolic and diastolic congestive heart failure ([place])   . Benign essential HTN   . DM type 2 with diabetic peripheral neuropathy ([place])   . Tachypnea   . Thrombocytopenia ([place])   . UTI (urinary tract infection) 04/01/2016  . Acute CVA (cerebrovascular accident) ([place]) 03/31/2016  . Acute hemorrhagic infarction of brain ([place])   . Cerebrovascular accident (CVA) due to embolism of right middle cerebral artery ([employer])   . Subtherapeutic international normalized ratio (INR)   . CVA (cerebral vascular accident) ([place]) 03/30/2016  . CKD (chronic kidney disease) stage 3, GFR 30-59 ml/min 03/30/2016  . Chronic anticoagulation 03/30/2016  . Diabetes ([place]) 01/31/2009  . Hyperlipidemia 01/31/2009  . ANXIETY 01/31/2009  . Coronary atherosclerosis 01/31/2009  . CAD, AUTOLOGOUS BYPASS GRAFT 01/31/2009  . CARDIOMYOPATHY, ISCHEMIC 01/31/2009  . ATRIAL FIBRILLATION 01/31/2009  . SYSTOLIC HEART FAILURE, CHRONIC 01/31/2009  . TACHYCARDIA, HX OF 01/31/2009    Past Medical History:  Past Medical  History  Diagnosis Date  . CAD (coronary artery disease) of artery bypass graft 05/2008    S/P CABG in 2009  . Ischemic cardiomyopathy   . Systolic heart failure   . Atrial fibrillation ([place])     Postoperative in 2009;  S/P transesophageal echocardiography-guided cardioversion, previously on Amiodarone and Coumadin; recurrent in April 2013  . Hyperlipidemia   . Chronic anticoagulation   . Obesities, morbid ([place])   . Hypertension   . Heart murmur   . CHF (congestive heart failure) ([place])   . Type II diabetes mellitus ([place])   . TIA (transient ischemic attack) 03/30/2016  . Arthritis     "qwhere" (03/30/2016)  . Chronic lower back pain   . Chronic kidney disease (CKD), stage III (moderate)    Past Surgical History:  Past Surgical History  Procedure Laterality Date  . Ventral hernia repair  10/1999    With mesh  . Total knee arthroplasty Right 1994  . Appendectomy  1946  . Joint replacement    . Hernia repair    . Cataract extraction Right 2012  . Coronary angioplasty with stent placement  2009    "put 2 stents in"  . Tee with cardioversion      Postoperative in 2009;  S/P transesophageal echocardiography-guided cardioversion,    Assessment & Plan Clinical Impression: Patient is a 75 y.o. right handed female with history of CAD status post CABG, ischemic cardiomyopathy, hypertension, diastolic congestive heart failure, diabetes mellitus peripheral neuropathy,Chronic renal insufficiency baseline creatinine 1.3-1.5, atrial fibrillation maintained on chronic Coumadin And aspirin. Per chart review patient lives with son. Ambulating short distances prior  to admission using a rolling walker. One level home. Son is only available in the evenings, but grand daughter may be available during the day. Presented 03/30/2016 with sudden onset of left-sided weakness and facial droop with slurred speech. Cranial CT scan showed focal hypodensity within the right basal ganglia highly suspicious for  evolving ischemia. Possible extension into the right subinsular region. Remote pontine infarct. Patient did not receive TPA. INR on admission of 1.59. CT angiogram head and neck showed right proximal M2 occlusion with distal reconstitution. There was completed infarct in the right caudate, putamen, internal capsule and posterior insula. Echocardiogram with ejection fraction of 40% diffuse hypokinesis. MRI/CT 03/31/2016 of the brain showed right MCA with hemorrhagic transformation felt to be embolic secondary to atrial fibrillation. Neurology follow-up hold on antiplatelet and eliquis for now. Recommendations are to begin [facility] after repeat CT of head at day 7 post stroke. Urine study positive nitrite And greater than 100,000 Escherichia coli With intravenous Rocephin completed 04/03/2016. Tolerating a regular consistency diet.   Patient transferred to CIR on 04/04/2016 .    Patient currently requires mod with basic self-care skills secondary to muscle weakness, decreased cardiorespiratoy endurance, decreased coordination and decreased sitting balance, decreased standing balance, decreased postural control, hemiplegia and decreased balance strategies.  Prior to hospitalization, patient could complete ADLs with modified independent overall and min assist for bathing.  Patient will benefit from skilled intervention to decrease level of assist with basic self-care skills prior to discharge home with care partner.  Anticipate patient will require intermittent supervision and follow up home health.  OT - End of Session Activity Tolerance: Tolerates 30+ min activity with multiple rests Endurance Deficit: Yes Endurance Deficit Description: requires rest breaks after short bouts of mobility and standing OT Assessment Rehab Potential (ACUTE ONLY): Good OT Patient demonstrates impairments in the following area(s): Balance;Endurance;Motor;Pain;Safety OT Basic ADL's Functional Problem(s):  Eating;Grooming;Bathing;Dressing;Toileting OT Transfers Functional Problem(s): Toilet;Tub/Shower OT Additional Impairment(s): Fuctional Use of Upper Extremity OT Plan OT Intensity: Minimum of 1-2 x/day, 45 to 90 minutes OT Frequency: 5 out of 7 days OT Duration/Estimated Length of Stay: 10-12 days OT Treatment/Interventions: Balance/vestibular training;Discharge planning;Community reintegration;Disease Lawyer;Functional mobility training;Neuromuscular re-education;Pain management;Patient/family education;Psychosocial support;Self Care/advanced ADL retraining;Therapeutic Activities;Therapeutic Exercise;UE/LE Strength taining/ROM;UE/LE Coordination activities OT Self Feeding Anticipated Outcome(s): Mod I OT Basic Self-Care Anticipated Outcome(s): Mod I - min assist OT Toileting Anticipated Outcome(s): Mod I - min assist OT Bathroom Transfers Anticipated Outcome(s): Mod I - supervision OT Recommendation Patient destination: Home Follow Up Recommendations: Home health OT Equipment Recommended: To be determined   Skilled Therapeutic Intervention OT eval completed with discussion of rehab process, OT purpose, POC, ELOS, and goals.  ADL assessment completed at sit > stand level with bathing and dressing in room shower.  Pt required +2 initially to come to standing from arm chair in room.  Ambulated to toilet with personal Rollator and completed toilet transfer with min assist.  Placed BSC over toilet to elevate toilet seat to decrease burden of care with sit > stand from standard toilet.  Pt required assist with hygiene post toileting.  Bathing completed seated on tub bench in room shower with assist to wash lower legs and feet.  Pt reports having hired caregiver to assist with bathing 2x/week due to back pain.  Pt required assist to thread underwear and pants this session.  Mod assist sit > stand from tub bench.  Pt completed grooming tasks in standing at  sink with min assist for standing balance.  Returned to arm chair, placed w/c cushion to provide pressure relief and elevate surface of chair to decrease assist with sit > stand.  OT Evaluation Precautions/Restrictions  Precautions Precautions: Fall Precaution Comments: left knee weakness Restrictions Weight Bearing Restrictions: No General   Vital Signs Therapy Vitals Temp: 98.4 F (36.9 C) Temp Source: Oral Pulse Rate: 73 Resp: 16 BP: (!) 131/56 mmHg Patient Position (if appropriate): Lying Oxygen Therapy SpO2: 96 % O2 Device: Not Delivered Pain Pain Assessment Pain Assessment: 0-10 Pain Score: 4  Pain Type: Chronic pain Pain Location: Back Pain Orientation: Mid;Lower Pain Descriptors / Indicators: Aching Pain Onset: On-going Patients Stated Pain Goal: 2 Pain Intervention(s): Repositioned;Ambulation/increased activity Home Living/Prior Functioning Home Living Living Arrangements: Children Available Help at Discharge: Family (son available in evenings, grand daughter may be able to assist) Type of Home: House Home Access: Ramped entrance Home Layout: One level Bathroom Shower/Tub: Walk-in shower (4' ledge) Additional Comments: pt has a hired caregiver who assists monday and thursday with bathing and household tasks  Lives With: Son IADL History Homemaking Responsibilities: No Prior Function Level of Independence: Independent with basic ADLs, Requires assistive device for independence, Independent with transfers  Able to Take Stairs?: No Driving: Yes Vocation: Unemployed Leisure: Hobbies-yes (Comment) Comments: Goes out 3x/week for church, to friends house and to [employer] ADL  See Function Navigator Vision/Perception  Vision- History Baseline Vision/History: Wears glasses Wears Glasses: Reading only Patient Visual Report: No change from baseline Vision- Assessment Vision Assessment?: No apparent visual deficits  Cognition Overall Cognitive Status:  Within Functional Limits for tasks assessed Arousal/Alertness: Awake/alert Orientation Level: Person;Place;Situation Person: Oriented Place: Oriented Situation: Oriented Year: 2017 Month: May Day of Week: Incorrect Memory: Appears intact Immediate Memory Recall: Sock;Blue;Bed Memory Recall: Sock;Blue;Bed Memory Recall Sock: Without Cue Memory Recall Blue: Without Cue Memory Recall Bed: Without Cue Awareness: Appears intact Problem Solving: Appears intact Safety/Judgment: Appears intact Sensation Sensation Light Touch: Appears Intact Stereognosis: Not tested Hot/Cold: Not tested Proprioception: Appears Intact Coordination Gross Motor Movements are Fluid and Coordinated: No Fine Motor Movements are Fluid and Coordinated: No Finger Nose Finger Test: mildly slower Lt > Rt Heel Shin Test: slow and decreased excursion 9 Hole Peg Test: Rt: 36 seconds, Lt: 42 seconds Motor  Motor Motor - Skilled Clinical Observations: generalized weakness Mobility  Bed Mobility Bed Mobility: Supine to Sit Supine to Sit: 3: Mod assist Supine to Sit Details: Verbal cues for technique;Verbal cues for precautions/safety;Verbal cues for sequencing;Manual facilitation for placement  Trunk/Postural Assessment  Cervical Assessment Cervical Assessment: Exceptions to [facility] (forward head posture) Thoracic Assessment Thoracic Assessment: Exceptions to [facility] (increased kyphosis) Lumbar Assessment Lumbar Assessment: Exceptions to [facility] (reduced lumbar lordosis, posterior pelvic tilt) Postural Control Postural Control: Deficits on evaluation (decreased efficiency of stepping and righting reactions)  Balance Balance Balance Assessed: Yes Static Sitting Balance Static Sitting - Balance Support: No upper extremity supported;Feet supported Static Sitting - Level of Assistance: 6: Modified independent (Device/Increase time) Dynamic Sitting Balance Dynamic Sitting - Balance Support: No upper extremity  supported;Feet supported Dynamic Sitting - Level of Assistance: 5: Stand by assistance Dynamic Sitting - Balance Activities: Lateral lean/weight shifting;Forward lean/weight shifting;Reaching across midline Static Standing Balance Static Standing - Balance Support: Bilateral upper extremity supported Static Standing - Level of Assistance: 5: Stand by assistance Dynamic Standing Balance Dynamic Standing - Balance Support: Bilateral upper extremity supported;During functional activity Dynamic Standing - Level of Assistance: 5: Stand by assistance Dynamic Standing - Balance Activities: Lateral lean/weight shifting;Forward lean/weight shifting;Reaching across midline Extremity/Trunk  Assessment RUE Assessment RUE Assessment: Within Functional Limits (limited shoulder ROM to approx 140 degrees, strength grossly 4+/5) LUE Assessment LUE Assessment: Exceptions to [facility] (limited shoulder ROM to 120 degrees, strength grossly 4-/5 at shoulder, 4/5 distal)   See Function Navigator for Current Functional Status.   Refer to Care Plan for Long Term Goals  Recommendations for other services: None  Discharge Criteria: Patient will be discharged from OT if patient refuses treatment 3 consecutive times without medical reason, if treatment goals not met, if there is a change in medical status, if patient makes no progress towards goals or if patient is discharged from hospital.  The above assessment, treatment plan, treatment alternatives and goals were discussed and mutually agreed upon: by patient and by family  Ellwood Dense [facility] 04/05/2016, 5:19 PM

## 2016-04-05 NOTE — Progress Notes (Signed)
Patient information reviewed and entered into eRehab system by Eulamae Greenstein, RN, CRRN, PPS Coordinator.  Information including medical coding and functional independence measure will be reviewed and updated through discharge.     Per nursing patient was given "Data Collection Information Summary for Patients in Inpatient Rehabilitation Facilities with attached "Privacy Act Statement-Health Care Records" upon admission.  

## 2016-04-05 NOTE — Care Management Note (Signed)
Inpatient Rehabilitation Center Individual Statement of Services  Patient Name:  [name]  Date:  04/05/2016  Welcome to the [facility].  Our goal is to provide you with an individualized program based on your diagnosis and situation, designed to meet your specific needs.  With this comprehensive rehabilitation program, you will be expected to participate in at least 3 hours of rehabilitation therapies Monday-Friday, with modified therapy programming on the weekends.  Your rehabilitation program will include the following services:  Physical Therapy (PT), Occupational Therapy (OT), Speech Therapy (ST), 24 hour per day rehabilitation nursing, Therapeutic Recreaction (TR), Case Management (Social Worker), Rehabilitation Medicine, Nutrition Services and Pharmacy Services  Weekly team conferences will be held on Wednesday to discuss your progress.  Your Social Worker will talk with you frequently to get your input and to update you on team discussions.  Team conferences with you and your family in attendance may also be held.  Expected length of stay: 10-12 days Overall anticipated outcome: Mod/i and min bathing-lower body  Depending on your progress and recovery, your program may change. Your Social Worker will coordinate services and will keep you informed of any changes. Your Social Worker's name and contact numbers are listed  below.  The following services may also be recommended but are not provided by the [facility] will be made to provide these services after discharge if needed.  Arrangements include referral to agencies that provide these services.  Your insurance has been verified to be:  Holley Bouche Your primary doctor is:  Cytogeneticist  Pertinent information will be shared with your doctor and your insurance  company.  Social Worker:  Ovidio Kin, [place] or ([phone]  Information discussed with and copy given to patient by: Elease Hashimoto, 04/05/2016, 12:26 PM

## 2016-04-06 ENCOUNTER — Inpatient Hospital Stay (HOSPITAL_COMMUNITY): Payer: Medicare Other

## 2016-04-06 ENCOUNTER — Inpatient Hospital Stay (HOSPITAL_COMMUNITY): Payer: Medicare Other | Admitting: Occupational Therapy

## 2016-04-06 ENCOUNTER — Inpatient Hospital Stay (HOSPITAL_COMMUNITY): Payer: Medicare Other | Admitting: Physical Therapy

## 2016-04-06 LAB — GLUCOSE, CAPILLARY
GLUCOSE-CAPILLARY: 94 mg/dL (ref 65–99)
GLUCOSE-CAPILLARY: 151 mg/dL — AB (ref 65–99)
GLUCOSE-CAPILLARY: 149 mg/dL — AB (ref 65–99)
Glucose-Capillary: 134 mg/dL — ABNORMAL HIGH (ref 65–99)

## 2016-04-06 NOTE — Progress Notes (Signed)
Occupational Therapy Session Note  Patient Details  Name: [name] MRN: [id number] Date of Birth: Aug 15, 1941  Today's Date: 04/06/2016 OT Individual Time: 1500-1530 OT Individual Time Calculation (min): 30 min    Skilled Therapeutic Interventions/Progress Updates:   Pt completed supine to sit EOB with mod assist and HOB elevated approximately 30 degrees.  Discussed rolling to her side and transitioning to sidelying but pt reports increased back pain with this technique.  Min assist to scoot forward to the EOB in order to position herself for sit to stand.  Therapist assisted with donning gripper socks before completing stand pivot transfer to bedside chair.  Pt maintaining flexed posture in standing with decreased step length on the left during pivot transfer with therapist assisting and no assistive device.  Worked on LUE FM coordination in sitting with pt shuffling cards, dealing cards, as well as drawing and discarding cards with modified independence.  Pt left in bedside chair at end of session per request.  Call button and tray table in reach.     Therapy Documentation Precautions:  Precautions Precautions: Fall Precaution Comments: left knee weakness Restrictions Weight Bearing Restrictions: No  Pain: Pain Assessment Pain Assessment: No/denies pain ADL: See Function Navigator for Current Functional Status.   Therapy/Group: Individual Therapy  Vianna Venezia OTR/L 04/06/2016, 4:10 PM

## 2016-04-06 NOTE — Progress Notes (Signed)
Physical Therapy Session Note  Patient Details  Name: [name] MRN: [id number] Date of Birth: 09/14/1941  Today's Date: 04/06/2016 PT Individual Time: 0730-0830 PT Individual Time Calculation (min): 60 min   Short Term Goals: Week 1:  PT Short Term Goal 1 (Week 1): Pt will perform bed mobility minA PT Short Term Goal 2 (Week 1): Pt will perform stand pivot transfer with consistent S PT Short Term Goal 3 (Week 1): Pt will perform gait x150' with rollator and S PT Short Term Goal 4 (Week 1): Pt will perform ascent/descent of 4 6-inch stairs with B handrails and min guard  Skilled Therapeutic Interventions/Progress Updates:    Pt received seated in recliner, c/o back pain from sitting in recliner 9.5/10; otherwise agreeable to treatment. Requires minA to don robe in sitting. Sit >stand with close S. Gait into bathroom with rollator and min guard. Requires S for clothing management, totalA for peri hygiene. Pt reports concern with taking Eliquus due to cost, states she will likely not continue taking it when she leaves the hospital. Will discuss with CSW, MD. Gait 1x200' with rollator and min guard, occasional short standing rest breaks due to fatigue. Performed seated LE strengthening 1 set 15 reps of hip flexion marching, long arc quad, hip adduction isometric, ankle pumps, glute sets. Pt given handouts for LE exercises to perform independently outside of session. Sit <>stand 2x10 reps with S. Short distance gait in room with rollator and S to sit in high back chair at completion of session; all needs within reach.   Therapy Documentation Precautions:  Precautions Precautions: Fall Precaution Comments: left knee weakness Restrictions Weight Bearing Restrictions: No Pain: Pain Assessment Pain Assessment: 0-10 Pain Score: 9  Pain Type: Chronic pain Pain Location: Back Pain Orientation: Lower Pain Descriptors / Indicators: Aching;Sharp Pain Onset: On-going Patients Stated Pain Goal:  2 Pain Intervention(s): Repositioned;Ambulation/increased activity   See Function Navigator for Current Functional Status.   Therapy/Group: Individual Therapy  Luberta Mutter 04/06/2016, 8:30 AM

## 2016-04-06 NOTE — IPOC Note (Signed)
Overall Plan of Care [facility]) Patient Details Name: [name] MRN: [id number] DOB: [birth year]  Admitting Diagnosis: R CVA  Hospital Problems: Active Problems:   Right middle cerebral artery stroke (HCC)   Facial weakness, post-stroke   Hemiparesis (HCC)   Persistent atrial fibrillation (HCC)   Chronic pain syndrome   HLD (hyperlipidemia)   Chronic diastolic congestive heart failure (HCC)   Coronary artery disease involving coronary bypass graft of native heart without angina pectoris   CKD (chronic kidney disease)   E-coli UTI     Functional Problem List: Nursing Bladder, Endurance, Medication Management, Motor, Pain, Sensory  PT Balance, Safety, Endurance, Motor, Pain  OT Balance, Endurance, Motor, Pain, Safety  SLP    TR         Basic ADL's: OT Eating, Grooming, Bathing, Dressing, Toileting     Advanced  ADL's: OT       Transfers: PT Bed Mobility, Bed to Chair, Car, Manufacturing systems engineer, Metallurgist: PT Ambulation, Stairs     Additional Impairments: OT Fuctional Use of Upper Extremity  SLP        TR      Anticipated Outcomes Item Anticipated Outcome  Self Feeding Mod I  Swallowing      Basic self-care  Mod I - min assist  Toileting  Mod I - min assist   Bathroom Transfers Mod I - supervision  Bowel/Bladder  managed mod assist   Transfers  mod I  Locomotion  mod I in home, S stairs and community mobility  Communication     Cognition     Pain  less than 4  Safety/Judgment  mod I   Therapy Plan: PT Intensity: Minimum of 1-2 x/day ,45 to 90 minutes PT Frequency: 5 out of 7 days PT Duration Estimated Length of Stay: 10-12 days OT Intensity: Minimum of 1-2 x/day, 45 to 90 minutes OT Frequency: 5 out of 7 days OT Duration/Estimated Length of Stay: 10-12 days         Team Interventions: Nursing Interventions Patient/Family Education, Bladder Management, Disease Management/Prevention, Pain Management, Medication  Management, Discharge Planning  PT interventions Ambulation/gait training, Balance/vestibular training, Community reintegration, Discharge planning, Disease management/prevention, Neuromuscular re-education, Functional mobility training, Pain management, Patient/family education, Stair training, Therapeutic Activities, Therapeutic Exercise, UE/LE Coordination activities, UE/LE Strength taining/ROM, DME/adaptive equipment instruction  OT Interventions Training and development officer, Discharge planning, Community reintegration, Disease mangement/prevention, Engineer, drilling, Functional mobility training, Neuromuscular re-education, Pain management, Patient/family education, Psychosocial support, Self Care/advanced ADL retraining, Therapeutic Activities, Therapeutic Exercise, UE/LE Strength taining/ROM, UE/LE Coordination activities  SLP Interventions    TR Interventions    SW/CM Interventions Discharge Planning, Psychosocial Support, Patient/Family Education    Team Discharge Planning: Destination: PT-Home ,OT- Home , SLP-  Projected Follow-up: PT-Home health PT, OT-  Home health OT, SLP-  Projected Equipment Needs: PT-To be determined, OT- To be determined, SLP-  Equipment Details: PT-has rollator , OT-  Patient/family involved in discharge planning: PT- Patient, Family member/caregiver,  OT-Patient, Family member/caregiver, SLP-   MD ELOS: 10-14 days. Medical Rehab Prognosis:  Good Assessment: 75 y.o. right handed female with history of CAD status post CABG, ischemic cardiomyopathy, hypertgension, diastolic congestive heart failure, diabetes mellitus peripheral neuropathy,Chronic renal insufficiency baseline creatinine 1.3-1.5, atrial fibrillation maintained on chronic Coumadin And aspirin. Per chart review patient lives with son. Ambulating short distances prior to admission using a rolling walker. One level home. Son is only available in the evenings, but grand daughter may be  available during the day. Presented 03/30/2016 with sudden onset of left-sided weakness and facial droop with slurred speech. Cranial CT scan showed focal hypodensity within the right basal ganglia highly suspicious for evolving ischemia. Possible extension into the right subinsular region. Remote pontine infarct. Patient did not receive TPA. INR on admission of 1.59. CT angiogram head and neck showed right proximal M2 occlusion with distal reconstitution. There was completed infarct in the right caudate, putamen, internal capsule and posterior insula. Echocardiogram with ejection fraction of 40% diffuse hypokinesis. MRI/CT 03/31/2016 of the brain showed right MCA with hemorrhagic transformation felt to be embolic secondary to atrial fibrillation. Neurology follow-up Eliquis BID after repeat head CT showing mild improvement.  Urine study positive nitrite And greater than 100,000 Escherichia coli With intravenous Rocephin completed 04/03/2016. Tolerating a regular consistency diet. Pt with functional deficits of left hemiparesis, facial droop, gait abnormality, and weakness. Will set goals for Mod I/Supervision with therapies.    See Team Conference Notes for weekly updates to the plan of care

## 2016-04-06 NOTE — Progress Notes (Signed)
Occupational Therapy Session Note  Patient Details  Name: [name] MRN: [id number] Date of Birth: May 27, 1941  Today's Date: 04/06/2016 OT Individual Time: [id number] and [id number] OT Individual Time Calculation (min): 40 min and 60 min   Short Term Goals: Week 1:  OT Short Term Goal 1 (Week 1): Pt will complete LB dressing with min assist OT Short Term Goal 2 (Week 1): Pt will complete walk-in shower transfers with min assist OT Short Term Goal 3 (Week 1): Pt will complete toilet transfers with min assist OT Short Term Goal 4 (Week 1): Pt will complete 2 grooming tasks in standing for increased activity tolerance  Skilled Therapeutic Interventions/Progress Updates:    1) Treatment session with focus on increased participation in self-care tasks, sit > stand, and functional use of LUE.  Pt declined bathing at shower level this session.  Completed partial sponge bath at sink with setup for items.  Pt demonstrated improved ability to thread pants this session and stood with close supervision while pulling pants over hips.  Pt required assist with washing lower legs and applying lotion (same as PTA) but donned slip on shoes without assist.  Educated on LUE strengthening and [place] activities to complete between sessions with pt demonstrating exercises and discussed functional carryover to leisure and home making tasks.  2) Treatment session with focus on sit > stand and activity tolerance.  Pt ambulated to therapy Dayroom with Rollator and min guard assist approx 100 feet.  Pt required increased time for sit > stand from EOB but able to complete with contact guard.  Engaged in sit > stand from elevated therapy mat with supervision and increased time.  Pt tolerated standing for 8-10 mins and then 8 mins before requesting seated rest breaks.  Pt reports pain in lower back requiring rest breaks.  Returned to room and left supine in bed due to pain in back.  Therapy Documentation Precautions:   Precautions Precautions: Fall Precaution Comments: left knee weakness Restrictions Weight Bearing Restrictions: No Pain: Pain Assessment Pain Assessment: 0-10 Pain Score: 2  Pain Type: Chronic pain Pain Location: Back Pain Orientation: Lower Pain Descriptors / Indicators: Aching;Sharp Pain Onset: On-going Patients Stated Pain Goal: 2 Pain Intervention(s): Repositioned;Ambulation/increased activity  See Function Navigator for Current Functional Status.   Therapy/Group: Individual Therapy  Simonne Come 04/06/2016, 10:56 AM

## 2016-04-06 NOTE — Progress Notes (Signed)
75 y.o. right handed female with history of CAD status post CABG, ischemic cardiomyopathy, hypertension, diastolic congestive heart failure, diabetes mellitus peripheral neuropathy,Chronic renal insufficiency baseline creatinine 1.3-1.5, atrial fibrillation maintained on chronic Coumadin And aspirin. Per chart review patient lives with son. Ambulating short distances prior to admission using a rolling walker. One level home. Son is only available in the evenings, but grand daughter may be available during the day. Presented 03/30/2016 with sudden onset of left-sided weakness and facial droop with slurred speech. Cranial CT scan showed focal hypodensity within the right basal ganglia highly suspicious for evolving ischemia. Possible extension into the right subinsular region. Remote pontine infarct. Patient did not receive TPA. INR on admission of 1.59. CT angiogram head and neck showed right proximal M2 occlusion with distal reconstitution. There was completed infarct in the right caudate, putamen, internal capsule and posterior insula. Echocardiogram with ejection fraction of 40% diffuse hypokinesis. MRI/CT 03/31/2016 of the brain showed right MCA with hemorrhagic transformation felt to be embolic secondary to atrial fibrillation. Neurology follow-up hold on antiplatelet and eliquis for now. Recommendations are to begin [facility] after repeat CT of head at day 7 post stroke.   Subjective/Complaints: We discussed repeat CT scan and possibility of starting Eliquis if scan shows resolution of hemmorhage Pt concerned about cost but "want to get off coumadin" ROS- neg CP/SOB/ , N/V/D  Objective: Vital Signs: Blood pressure 108/43, pulse 65, temperature 97.8 F (36.6 C), temperature source Oral, resp. rate 18, height '5\' 8"'$  (1.727 m), weight 142.248 kg (313 lb 9.6 oz), SpO2 99 %. No results found. Results for orders placed or performed during the hospital encounter of 04/04/16 (from the past 72 hour(s))   Glucose, capillary     Status: None   Collection Time: 04/04/16  4:22 PM  Result Value Ref Range   Glucose-Capillary 79 65 - 99 mg/dL   Comment 1 Notify RN   Glucose, capillary     Status: Abnormal   Collection Time: 04/04/16  8:43 PM  Result Value Ref Range   Glucose-Capillary 134 (H) 65 - 99 mg/dL  CBC WITH DIFFERENTIAL     Status: Abnormal   Collection Time: 04/05/16  5:16 AM  Result Value Ref Range   WBC 6.8 4.0 - 10.5 K/uL   RBC 3.90 3.87 - 5.11 MIL/uL   Hemoglobin 11.2 (L) 12.0 - 15.0 g/dL   HCT 35.2 (L) 36.0 - 46.0 %   MCV 90.3 78.0 - 100.0 fL   MCH 28.7 26.0 - 34.0 pg   MCHC 31.8 30.0 - 36.0 g/dL   RDW 14.9 11.5 - 15.5 %   Platelets 151 150 - 400 K/uL   Neutrophils Relative % 59 %   Neutro Abs 4.0 1.7 - 7.7 K/uL   Lymphocytes Relative 20 %   Lymphs Abs 1.3 0.7 - 4.0 K/uL   Monocytes Relative 15 %   Monocytes Absolute 1.0 0.1 - 1.0 K/uL   Eosinophils Relative 6 %   Eosinophils Absolute 0.4 0.0 - 0.7 K/uL   Basophils Relative 0 %   Basophils Absolute 0.0 0.0 - 0.1 K/uL  Comprehensive metabolic panel     Status: Abnormal   Collection Time: 04/05/16  5:16 AM  Result Value Ref Range   Sodium 137 135 - 145 mmol/L   Potassium 3.7 3.5 - 5.1 mmol/L   Chloride 102 101 - 111 mmol/L   CO2 27 22 - 32 mmol/L   Glucose, Bld 139 (H) 65 - 99 mg/dL   BUN 34 (  H) 6 - 20 mg/dL   Creatinine, Ser 1.18 (H) 0.44 - 1.00 mg/dL   Calcium 9.0 8.9 - 10.3 mg/dL   Total Protein 6.3 (L) 6.5 - 8.1 g/dL   Albumin 2.6 (L) 3.5 - 5.0 g/dL   AST 31 15 - 41 U/L   ALT 18 14 - 54 U/L   Alkaline Phosphatase 120 38 - 126 U/L   Total Bilirubin 0.8 0.3 - 1.2 mg/dL   GFR calc non Af Amer 43 (L) >60 mL/min   GFR calc Af Amer 50 (L) >60 mL/min    Comment: (NOTE) The eGFR has been calculated using the CKD EPI equation. This calculation has not been validated in all clinical situations. eGFR's persistently <60 mL/min signify possible Chronic Kidney Disease.    Anion gap 8 5 - 15  Glucose, capillary      Status: Abnormal   Collection Time: 04/05/16  6:56 AM  Result Value Ref Range   Glucose-Capillary 121 (H) 65 - 99 mg/dL  Glucose, capillary     Status: Abnormal   Collection Time: 04/05/16 11:45 AM  Result Value Ref Range   Glucose-Capillary 159 (H) 65 - 99 mg/dL  Glucose, capillary     Status: Abnormal   Collection Time: 04/05/16  4:38 PM  Result Value Ref Range   Glucose-Capillary 157 (H) 65 - 99 mg/dL  Glucose, capillary     Status: Abnormal   Collection Time: 04/05/16  8:27 PM  Result Value Ref Range   Glucose-Capillary 178 (H) 65 - 99 mg/dL  Glucose, capillary     Status: None   Collection Time: 04/06/16  6:45 AM  Result Value Ref Range   Glucose-Capillary 94 65 - 99 mg/dL     HEENT: normal Cardio: RRR and no murmur Resp: RRR and no murmur GI: BS positive and NT, ND Extremity:  Pulses positive and No Edema Skin:   Intact Neuro: Alert/Oriented, Normal Sensory, Abnormal Motor 3- bilateral HF, 4.5 B Knee ext and ankles, 5/5 in BUE and Abnormal FMC Ataxic/ dec FMC Musc/Skel:  Normal Gen NAD   Assessment/Plan: 1. Functional deficits secondary to Right MCA infarct which require 3+ hours per day of interdisciplinary therapy in a comprehensive inpatient rehab setting. Physiatrist is providing close team supervision and 24 hour management of active medical problems listed below. Physiatrist and rehab team continue to assess barriers to discharge/monitor patient progress toward functional and medical goals. FIM: Function - Bathing Position: Shower Body parts bathed by patient: Right arm, Left arm, Chest, Abdomen, Front perineal area, Right upper leg, Left upper leg Body parts bathed by helper: Right lower leg, Left lower leg, Back Bathing not applicable: Buttocks Assist Level:  (Mod assist)  Function- Upper Body Dressing/Undressing What is the patient wearing?: Pull over shirt/dress Pull over shirt/dress - Perfomed by patient: Thread/unthread right sleeve, Thread/unthread  left sleeve, Put head through opening, Pull shirt over trunk Assist Level: Set up Set up : To obtain clothing/put away Function - Lower Body Dressing/Undressing What is the patient wearing?: Underwear, Pants, Shoes Position:  (seated on tub bench in room shower) Underwear - Performed by patient: Pull underwear up/down Underwear - Performed by helper: Thread/unthread right underwear leg, Thread/unthread left underwear leg Pants- Performed by patient: Pull pants up/down Pants- Performed by helper: Thread/unthread right pants leg, Thread/unthread left pants leg Shoes - Performed by patient: Don/doff right shoe, Don/doff left shoe (wears slip on "crocs") Assist for footwear: Setup Assist for lower body dressing:  (Mod assist)  Function -  Toileting Toileting steps completed by patient: Adjust clothing prior to toileting, Adjust clothing after toileting Toileting steps completed by helper: Performs perineal hygiene Toileting Assistive Devices: Grab bar or rail Assist level: No help/no cues  Function - Air cabin crew transfer assistive device: Elevated toilet seat/BSC over toilet, Walker Assist level to toilet: Supervision or verbal cues Assist level from toilet: Supervision or verbal cues Assist level to bedside commode (at bedside): Moderate assist (Pt 50 - 74%/lift or lower) Assist level from bedside commode (at bedside): Moderate assist (Pt 50 - 74%/lift or lower)  Function - Chair/bed transfer Chair/bed transfer method: Ambulatory Chair/bed transfer assist level: Touching or steadying assistance (Pt > 75%) Chair/bed transfer assistive device: Walker Chair/bed transfer details: Verbal cues for precautions/safety, Verbal cues for sequencing, Verbal cues for technique, Tactile cues for posture  Function - Locomotion: Wheelchair Will patient use wheelchair at discharge?: No Function - Locomotion: Ambulation Assistive device: Walker-rolling Max distance: 8 Assist level:  Supervision or verbal cues Assist level: Supervision or verbal cues Walk 50 feet with 2 turns activity did not occur: Safety/medical concerns Assist level: Touching or steadying assistance (Pt > 75%) Assist level: Touching or steadying assistance (Pt > 75%) Walk 10 feet on uneven surfaces activity did not occur: Safety/medical concerns  Function - Comprehension Comprehension: Auditory Comprehension assist level: Follows complex conversation/direction with extra time/assistive device  Function - Expression Expression: Verbal Expression assist level: Expresses complex ideas: With extra time/assistive device  Function - Social Interaction Social Interaction assist level: Interacts appropriately with others with medication or extra time (anti-anxiety, antidepressant).  Function - Problem Solving Problem solving assist level: Solves complex problems: With extra time  Function - Memory Memory assist level: More than reasonable amount of time Patient normally able to recall (first 3 days only): Current season, Location of own room, Staff names and faces, That he or she is in a hospital   Medical Problem List and Plan: 1.  Left hemiparesis, facial droop  secondary to right MCA infarct with hemorrhagic transformation felt to be embolic secondary to atrial fibrillation. Plan to begin [facility] after repeat CT of the head 04/07/2016 post stroke,Cont CIR PT, OT SLP 2.  DVT Prophylaxis/Anticoagulation: SCDs. Monitor for any signs of DVT 3. Pain Management/chronic back pain: Neurontin 400 mg daily at bedtime, Tramadol 100 mg daily 50 mg at bedtime as prior to admission 4. Hypertension. Coreg 6.25 mg twice a day, Lasix 40 mg daily. Monitor with increased mobility 5. Neuropsych: This patient is capable of making decisions on his own behalf. 6. Skin/Wound Care: Routine skin checks 7. Fluids/Electrolytes/Nutrition: Routine I&O's with follow-up chemistries 8. Atrial fibrillation. Coumadin discontinued  due to hemorrhagic transformation. Plan to begin ELIQUIS day 7 post stroke if CT head shows resolved hemorrhage. Cardiac rate controlled 9. Diabetes mellitus of peripheral neuropathy. Hemoglobin A1c 8.1. Lantus insulin 18 units daily. Check blood sugars before meals and at bedtime. Diabetic teaching 10. Diastolic congestive heart failure. Weigh patient daily. Continue Lasix 40 mg daily. Monitor for any signs of fluid overload 11. CAD status post CABG. Aspirin and Coumadin discontinued due to hemorrhagic conversion. No chest pain or shortness of breath. 12. Chronic renal insufficiency. Creatinine 1.3-1.5.repeat 5/25 improved vs baseline 13. Escherichia coli urinary tract infection. Intravenous Rocephin completed 04/03/2016, no evidence of incont 14. Hyperlipidemia. Lipitor 15.  Psycho social : Pt concerned about cost of Eliquis (~400.00 per mo) will ask SW regarding assistance program  LOS (Days) 2 A FACE TO [place] E 04/06/2016,  7:29 AM

## 2016-04-06 NOTE — Progress Notes (Signed)
Follow-up CT of the head 04/06/2016 stable appearance of right basal ganglia hemorrhagic infarct. Slight improvement in mass effect on the frontal horn of the right lateral ventricle. Discussed with neurology services Dr.XU and recommends to start ELIQUIS 5 mg twice a day in 5 days without CT repeat if no neuro changes. If any changes stat CT.

## 2016-04-07 LAB — GLUCOSE, CAPILLARY
GLUCOSE-CAPILLARY: 150 mg/dL — AB (ref 65–99)
Glucose-Capillary: 130 mg/dL — ABNORMAL HIGH (ref 65–99)
Glucose-Capillary: 174 mg/dL — ABNORMAL HIGH (ref 65–99)
Glucose-Capillary: 169 mg/dL — ABNORMAL HIGH (ref 65–99)

## 2016-04-07 NOTE — Progress Notes (Signed)
Subjective/Complaints: CT reviewed, appreciate Neuro input ROS- neg CP/SOB/ , N/V/D  Objective: Vital Signs: Blood pressure 124/55, pulse 68, temperature 98.3 F (36.8 C), temperature source Oral, resp. rate 17, height '5\' 8"'$  (1.727 m), weight 140.207 kg (309 lb 1.6 oz), SpO2 98 %. Ct Head Wo Contrast  04/06/2016  CLINICAL DATA:  Followup scan to evaluate hemorrhagic infarct. Patient reports doing well. EXAM: CT HEAD WITHOUT CONTRAST TECHNIQUE: Contiguous axial images were obtained from the base of the skull through the vertex without intravenous contrast. COMPARISON:  03/31/2016 and earlier FINDINGS: Again noted are hemorrhagic infarcts involving the right colonic head and right lentiform nucleus. Slightly less mass effect on the frontal horn of the right lateral ventricle. There is periventricular white matter change. No new infarct or areas of hemorrhage are identified. There is dense atherosclerosis of the internal carotid arteries. IMPRESSION: 1. Stable appearance of right basal ganglia hemorrhagic infarcts. Slight improvement in mass effect on the frontal horn of the right lateral ventricle. 2. Changes of small vessel disease. 3. No new evidence for new hemorrhage or infarct. Electronically Signed   By: Nolon Nations M.D.   On: 04/06/2016 12:56   Results for orders placed or performed during the hospital encounter of 04/04/16 (from the past 72 hour(s))  Glucose, capillary     Status: None   Collection Time: 04/04/16  4:22 PM  Result Value Ref Range   Glucose-Capillary 79 65 - 99 mg/dL   Comment 1 Notify RN   Glucose, capillary     Status: Abnormal   Collection Time: 04/04/16  8:43 PM  Result Value Ref Range   Glucose-Capillary 134 (H) 65 - 99 mg/dL  CBC WITH DIFFERENTIAL     Status: Abnormal   Collection Time: 04/05/16  5:16 AM  Result Value Ref Range   WBC 6.8 4.0 - 10.5 K/uL   RBC 3.90 3.87 - 5.11 MIL/uL   Hemoglobin 11.2 (L) 12.0 - 15.0 g/dL   HCT 35.2 (L) 36.0 - 46.0 %    MCV 90.3 78.0 - 100.0 fL   MCH 28.7 26.0 - 34.0 pg   MCHC 31.8 30.0 - 36.0 g/dL   RDW 14.9 11.5 - 15.5 %   Platelets 151 150 - 400 K/uL   Neutrophils Relative % 59 %   Neutro Abs 4.0 1.7 - 7.7 K/uL   Lymphocytes Relative 20 %   Lymphs Abs 1.3 0.7 - 4.0 K/uL   Monocytes Relative 15 %   Monocytes Absolute 1.0 0.1 - 1.0 K/uL   Eosinophils Relative 6 %   Eosinophils Absolute 0.4 0.0 - 0.7 K/uL   Basophils Relative 0 %   Basophils Absolute 0.0 0.0 - 0.1 K/uL  Comprehensive metabolic panel     Status: Abnormal   Collection Time: 04/05/16  5:16 AM  Result Value Ref Range   Sodium 137 135 - 145 mmol/L   Potassium 3.7 3.5 - 5.1 mmol/L   Chloride 102 101 - 111 mmol/L   CO2 27 22 - 32 mmol/L   Glucose, Bld 139 (H) 65 - 99 mg/dL   BUN 34 (H) 6 - 20 mg/dL   Creatinine, Ser 1.18 (H) 0.44 - 1.00 mg/dL   Calcium 9.0 8.9 - 10.3 mg/dL   Total Protein 6.3 (L) 6.5 - 8.1 g/dL   Albumin 2.6 (L) 3.5 - 5.0 g/dL   AST 31 15 - 41 U/L   ALT 18 14 - 54 U/L   Alkaline Phosphatase 120 38 - 126 U/L   Total  Bilirubin 0.8 0.3 - 1.2 mg/dL   GFR calc non Af Amer 43 (L) >60 mL/min   GFR calc Af Amer 50 (L) >60 mL/min    Comment: (NOTE) The eGFR has been calculated using the CKD EPI equation. This calculation has not been validated in all clinical situations. eGFR's persistently <60 mL/min signify possible Chronic Kidney Disease.    Anion gap 8 5 - 15  Glucose, capillary     Status: Abnormal   Collection Time: 04/05/16  6:56 AM  Result Value Ref Range   Glucose-Capillary 121 (H) 65 - 99 mg/dL  Glucose, capillary     Status: Abnormal   Collection Time: 04/05/16 11:45 AM  Result Value Ref Range   Glucose-Capillary 159 (H) 65 - 99 mg/dL  Glucose, capillary     Status: Abnormal   Collection Time: 04/05/16  4:38 PM  Result Value Ref Range   Glucose-Capillary 157 (H) 65 - 99 mg/dL  Glucose, capillary     Status: Abnormal   Collection Time: 04/05/16  8:27 PM  Result Value Ref Range   Glucose-Capillary  178 (H) 65 - 99 mg/dL  Glucose, capillary     Status: None   Collection Time: 04/06/16  6:45 AM  Result Value Ref Range   Glucose-Capillary 94 65 - 99 mg/dL  Glucose, capillary     Status: Abnormal   Collection Time: 04/06/16 11:33 AM  Result Value Ref Range   Glucose-Capillary 134 (H) 65 - 99 mg/dL  Glucose, capillary     Status: Abnormal   Collection Time: 04/06/16  5:49 PM  Result Value Ref Range   Glucose-Capillary 151 (H) 65 - 99 mg/dL  Glucose, capillary     Status: Abnormal   Collection Time: 04/06/16  9:09 PM  Result Value Ref Range   Glucose-Capillary 149 (H) 65 - 99 mg/dL  Glucose, capillary     Status: Abnormal   Collection Time: 04/07/16  7:07 AM  Result Value Ref Range   Glucose-Capillary 130 (H) 65 - 99 mg/dL     HEENT: normal Cardio: RRR and no murmur Resp: RRR and no murmur GI: BS positive and NT, ND Extremity:  Pulses positive and No Edema Skin:   Intact Neuro: Alert/Oriented, Normal Sensory, Abnormal Motor 3- bilateral HF, 4.5 B Knee ext and ankles, 5/5 in BUE and Abnormal FMC Ataxic/ dec FMC Musc/Skel:  Normal Gen NAD   Assessment/Plan: 1. Functional deficits secondary to Right MCA infarct which require 3+ hours per day of interdisciplinary therapy in a comprehensive inpatient rehab setting. Physiatrist is providing close team supervision and 24 hour management of active medical problems listed below. Physiatrist and rehab team continue to assess barriers to discharge/monitor patient progress toward functional and medical goals. FIM: Function - Bathing Position: Wheelchair/chair at sink Body parts bathed by patient: Right arm, Left arm, Chest, Abdomen, Right upper leg, Left upper leg Body parts bathed by helper: Right lower leg, Left lower leg Bathing not applicable:  (declined washing buttocks as already donned underwear) Assist Level: Touching or steadying assistance(Pt > 75%)  Function- Upper Body Dressing/Undressing What is the patient wearing?:  Bra, Pull over shirt/dress Bra - Perfomed by patient: Thread/unthread right bra strap, Thread/unthread left bra strap, Hook/unhook bra (pull down sports bra) Pull over shirt/dress - Perfomed by patient: Thread/unthread right sleeve, Thread/unthread left sleeve, Put head through opening, Pull shirt over trunk Assist Level: Set up Set up : To obtain clothing/put away Function - Lower Body Dressing/Undressing What is the patient wearing?: Pants,  Shoes Position:  (arm chair in room) Underwear - Performed by patient: Pull underwear up/down Underwear - Performed by helper: Thread/unthread right underwear leg, Thread/unthread left underwear leg Pants- Performed by patient: Thread/unthread right pants leg, Thread/unthread left pants leg, Pull pants up/down Pants- Performed by helper: Thread/unthread right pants leg, Thread/unthread left pants leg Shoes - Performed by patient: Don/doff right shoe, Don/doff left shoe Assist for footwear: Independent Assist for lower body dressing: Set up Set up : To obtain clothing/put away  Function - Toileting Toileting steps completed by patient: Adjust clothing prior to toileting, Adjust clothing after toileting Toileting steps completed by helper: Performs perineal hygiene Toileting Assistive Devices: Grab bar or rail Assist level: Touching or steadying assistance (Pt.75%)  Function - Toilet Transfers Toilet transfer assistive device: Elevated toilet seat/BSC over toilet, Walker Assist level to toilet: Supervision or verbal cues Assist level from toilet: Supervision or verbal cues Assist level to bedside commode (at bedside): Moderate assist (Pt 50 - 74%/lift or lower) Assist level from bedside commode (at bedside): Moderate assist (Pt 50 - 74%/lift or lower)  Function - Chair/bed transfer Chair/bed transfer method: Ambulatory Chair/bed transfer assist level: Touching or steadying assistance (Pt > 75%) Chair/bed transfer assistive device:  Walker Chair/bed transfer details: Verbal cues for precautions/safety, Verbal cues for sequencing, Verbal cues for technique, Tactile cues for posture  Function - Locomotion: Wheelchair Will patient use wheelchair at discharge?: No Function - Locomotion: Ambulation Assistive device: Walker-rolling Max distance: 200 Assist level: Touching or steadying assistance (Pt > 75%) Assist level: Touching or steadying assistance (Pt > 75%) Walk 50 feet with 2 turns activity did not occur: Safety/medical concerns Assist level: Touching or steadying assistance (Pt > 75%) Assist level: Touching or steadying assistance (Pt > 75%) Walk 10 feet on uneven surfaces activity did not occur: Safety/medical concerns  Function - Comprehension Comprehension: Auditory Comprehension assist level: Follows complex conversation/direction with no assist  Function - Expression Expression: Verbal Expression assist level: Expresses complex 90% of the time/cues < 10% of the time  Function - Social Interaction Social Interaction assist level: Interacts appropriately with others - No medications needed.  Function - Problem Solving Problem solving assist level: Solves complex problems: Recognizes & self-corrects  Function - Memory Memory assist level: More than reasonable amount of time Patient normally able to recall (first 3 days only): Current season, Location of own room, Staff names and faces, That he or she is in a hospital   Medical Problem List and Plan: 1.  Left hemiparesis, facial droop  secondary to right MCA infarct with hemorrhagic transformation felt to be embolic secondary to atrial fibrillation. Plan to begin [facility] after repeat CT of the head 04/07/2016 post stroke,Cont CIR PT, OT SLP 2.  DVT Prophylaxis/Anticoagulation: SCDs. Monitor for any signs of DVT 3. Pain Management/chronic back pain: Neurontin 400 mg daily at bedtime, Tramadol 100 mg daily 50 mg at bedtime as prior to admission 4.  Hypertension. Coreg 6.25 mg twice a day, Lasix 40 mg daily. Monitor with increased mobility Filed Vitals:   04/06/16 1532 04/07/16 0517  BP: 120/88 124/55  Pulse: 80 68  Temp: 98.5 F (36.9 C) 98.3 F (36.8 C)  Resp: 18 17  5. Neuropsych: This patient is capable of making decisions on his own behalf. 6. Skin/Wound Care: Routine skin checks 7. Fluids/Electrolytes/Nutrition: Routine I&O's with follow-up chemistries 8. Atrial fibrillation. Coumadin discontinued due to hemorrhagic transformation. . Cardiac rate controlled, plan to start Eliquis in 5 days, ~5/31 9. Diabetes mellitus of peripheral  neuropathy. Hemoglobin A1c 8.1. Lantus insulin 18 units daily. Check blood sugars before meals and at bedtime. Diabetic teaching 10. Diastolic congestive heart failure. Weigh patient daily. Continue Lasix 40 mg daily. Monitor for any signs of fluid overload 11. CAD status post CABG. Aspirin and Coumadin discontinued due to hemorrhagic conversion. No chest pain or shortness of breath. 12. Chronic renal insufficiency. Creatinine 1.3-1.5.repeat 5/25 improved vs baseline  13. Hyperlipidemia. Lipitor, monitor generalized weakness 14.  Psycho social : Pt concerned about cost of Eliquis (~400.00 per mo) will ask SW regarding assistance program  LOS (Days) 3 A FACE TO [place] E 04/07/2016, 8:03 AM

## 2016-04-08 ENCOUNTER — Inpatient Hospital Stay (HOSPITAL_COMMUNITY): Payer: Medicare Other | Admitting: Occupational Therapy

## 2016-04-08 ENCOUNTER — Inpatient Hospital Stay (HOSPITAL_COMMUNITY): Payer: Medicare Other | Admitting: Physical Therapy

## 2016-04-08 LAB — GLUCOSE, CAPILLARY
GLUCOSE-CAPILLARY: 131 mg/dL — AB (ref 65–99)
GLUCOSE-CAPILLARY: 178 mg/dL — AB (ref 65–99)
Glucose-Capillary: 108 mg/dL — ABNORMAL HIGH (ref 65–99)
Glucose-Capillary: 153 mg/dL — ABNORMAL HIGH (ref 65–99)

## 2016-04-08 NOTE — Progress Notes (Signed)
Physical Therapy Session Note  Patient Details  Name: [name] MRN: [id number] Date of Birth: 16-Jul-1941  Today's Date: 04/08/2016 PT Individual Time: [phone] and [id number]  PT Individual Time Calculation (min): 71 min and 75 min    Short Term Goals: Week 1:  PT Short Term Goal 1 (Week 1): Pt will perform bed mobility minA PT Short Term Goal 2 (Week 1): Pt will perform stand pivot transfer with consistent S PT Short Term Goal 3 (Week 1): Pt will perform gait x150' with rollator and S PT Short Term Goal 4 (Week 1): Pt will perform ascent/descent of 4 6-inch stairs with B handrails and min guard  Skilled Therapeutic Interventions/Progress Updates:    Treatment 1: Pt received in recliner & agreeable to PT; noting 9-9.5/10 chronic back pain but states she cannot receive pain medication until 10am. Sit<>stand with supervision, gait training room>gym x 160 ft with rollator & supervision. Pt with decreased gait speed, decreased step length, & notes 5/10 fatigue after task. Pt transferred sit>supine on mat table & required mod A for BLE. In supine pt performed HEP exercises: glute sets, straight leg raises, and bridging. Pt required bolster under B knees to help reduce back pain, as well as instruction for smaller movements for straight leg raises. Pt unable to clear buttocks from mat table when bridging 2/2 weakness. Pt unable to perform crunches 2/2 increased back pain with exercise. Transferring supine>sit pt required mod/max A for trunk/upper body. Pt completed 5x sit-to-stand exercise from 20 inch, 19 inch, & 18 inch mat table with use of BUE's. Attempted sit-to-stand without BUE support but pt unable to complete transfer even with maximum multimodal cuing & mat table set at 22 inches high. Gait training 160 ft gym>room with rollator & supervision, L antalgic gait but pt reporting she is walking at her baseline. Reported need to use restroom, supervision A required for toilet transfers, (+) void.  PT encouraged pt to attempt peri-hygiene herself & was able to complete independently in standing position. At end of session pt left in recliner with all needs within reach.   Treatment 2: Pt received in recliner & agreeable to PT; pt reported 9.5/10 chronic back pain & PT notified RN. Pt's son present in room & therapist discussed potential need for bed rails at home & requested son to measure height of bed. Gait training in room to bathroom with rollator & supervision A, cuing pt to ambulate closer to rollator to reduce trunk flexion. Pt able to complete toilet transfers with supervision & independently performed hygiene care; (+) void. Gait training room>ortho gym ~300 ft with rollator & supervision A, requiring 2 separate seated rest breaks 2/2 fatigue. Pt voiced concerns regarding cost of Eliquis following d/c. Pt completed car transfer at low SUV simulated height and use of hand rail. Therapist educated pt to back up to seat, sit down, then transfer BLE into car instead of stepping into car. Pt able to complete transfer with supervision A. Stair negotiation over 4 steps x 2 trials completed for BLE strengthening; pt preferred to ascend stairs leading with LLE & descend leading with RLE, with use of B rails & close supervision/min guard assistance. Utilized Wii bowling to address pt's balance & standing tolerance; pt able to maintain standing without LOB with supervision A for 5 minutes 43 seconds + 3 minutes 17 seconds. Completed sit<>stand transfer from couch in rehab apartment & pt required max A with multiple attempts to complete sit>stand transfer even with multimodal cuing  for sequencing, hand placement on arm rests & technique for increased anterior weight shift. Gait training x 200 ft back to room with 1 seated rest break & rollator. At end of session pt left in recliner with BLE elevated, son present, & all needs within reach.   Therapy Documentation Precautions:  Precautions Precautions:  Fall Precaution Comments: left knee weakness Restrictions Weight Bearing Restrictions: No  Pain: Pain Assessment Pain Assessment: 0-10 Pain Score: 9  Pain Type: Chronic pain Pain Location: Back Pain Intervention(s): Ambulation/increased activity   See Function Navigator for Current Functional Status.   Therapy/Group: Individual Therapy  Waunita Schooner 04/08/2016, 7:58 AM

## 2016-04-08 NOTE — Progress Notes (Signed)
Occupational Therapy Session Note  Patient Details  Name: [name] MRN: [id number] Date of Birth: 1941/03/21  Today's Date: 04/08/2016 OT Individual Time:  -     1030- 1130   (60 min)      Short Term Goals: Week 1:  OT Short Term Goal 1 (Week 1): Pt will complete LB dressing with min assist OT Short Term Goal 2 (Week 1): Pt will complete walk-in shower transfers with min assist OT Short Term Goal 3 (Week 1): Pt will complete toilet transfers with min assist OT Short Term Goal 4 (Week 1): Pt will complete 2 grooming tasks in standing for increased activity tolerance Week 2:     Skilled Therapeutic Interventions/Progress Updates:    Pt ambulated to shower with RW and min assist .  Transferred to TTB with min to SBA.  Sit to stand for peri and LB bathing with min assist.   Uses reacher and sock aid for LB dressing socks.  Able to don  pants with reacher and no assist.  Ambulated to toilet and completed dressing with set up.  Ambulated to recliner for end of session.  Pt left in recliner with legs elevated.    Therapy Documentation Precautions:  Precautions Precautions: Fall Precaution Comments: left knee weakness Restrictions Weight Bearing Restrictions: No      Pain: Pain Assessment Pain Assessment: 0-10 Pain Score: 9  Pain Type: Chronic pain Pain Location: Back Pain Intervention(s): Ambulation/increased activity     See Function Navigator for Current Functional Status.   Therapy/Group: Individual Therapy  Lisa Roca 04/08/2016, 11:26 AM

## 2016-04-08 NOTE — Progress Notes (Signed)
Subjective/Complaints: No issues today, good BM yesterday no therapy yesterday but has some today ROS- neg CP/SOB/ , N/V/D  Objective: Vital Signs: Blood pressure 120/52, pulse 63, temperature 98.1 F (36.7 C), temperature source Oral, resp. rate 200, height 5\' 8"  (1.727 m), weight 139.889 kg (308 lb 6.4 oz), SpO2 98 %. Ct Head Wo Contrast  04/06/2016  CLINICAL DATA:  Followup scan to evaluate hemorrhagic infarct. Patient reports doing well. EXAM: CT HEAD WITHOUT CONTRAST TECHNIQUE: Contiguous axial images were obtained from the base of the skull through the vertex without intravenous contrast. COMPARISON:  03/31/2016 and earlier FINDINGS: Again noted are hemorrhagic infarcts involving the right colonic head and right lentiform nucleus. Slightly less mass effect on the frontal horn of the right lateral ventricle. There is periventricular white matter change. No new infarct or areas of hemorrhage are identified. There is dense atherosclerosis of the internal carotid arteries. IMPRESSION: 1. Stable appearance of right basal ganglia hemorrhagic infarcts. Slight improvement in mass effect on the frontal horn of the right lateral ventricle. 2. Changes of small vessel disease. 3. No new evidence for new hemorrhage or infarct. Electronically Signed   By: Nolon Nations M.D.   On: 04/06/2016 12:56   Results for orders placed or performed during the hospital encounter of 04/04/16 (from the past 72 hour(s))  Glucose, capillary     Status: Abnormal   Collection Time: 04/05/16 11:45 AM  Result Value Ref Range   Glucose-Capillary 159 (H) 65 - 99 mg/dL  Glucose, capillary     Status: Abnormal   Collection Time: 04/05/16  4:38 PM  Result Value Ref Range   Glucose-Capillary 157 (H) 65 - 99 mg/dL  Glucose, capillary     Status: Abnormal   Collection Time: 04/05/16  8:27 PM  Result Value Ref Range   Glucose-Capillary 178 (H) 65 - 99 mg/dL  Glucose, capillary     Status: None   Collection Time: 04/06/16   6:45 AM  Result Value Ref Range   Glucose-Capillary 94 65 - 99 mg/dL  Glucose, capillary     Status: Abnormal   Collection Time: 04/06/16 11:33 AM  Result Value Ref Range   Glucose-Capillary 134 (H) 65 - 99 mg/dL  Glucose, capillary     Status: Abnormal   Collection Time: 04/06/16  5:49 PM  Result Value Ref Range   Glucose-Capillary 151 (H) 65 - 99 mg/dL  Glucose, capillary     Status: Abnormal   Collection Time: 04/06/16  9:09 PM  Result Value Ref Range   Glucose-Capillary 149 (H) 65 - 99 mg/dL  Glucose, capillary     Status: Abnormal   Collection Time: 04/07/16  7:07 AM  Result Value Ref Range   Glucose-Capillary 130 (H) 65 - 99 mg/dL  Glucose, capillary     Status: Abnormal   Collection Time: 04/07/16 11:58 AM  Result Value Ref Range   Glucose-Capillary 150 (H) 65 - 99 mg/dL  Glucose, capillary     Status: Abnormal   Collection Time: 04/07/16  4:45 PM  Result Value Ref Range   Glucose-Capillary 174 (H) 65 - 99 mg/dL  Glucose, capillary     Status: Abnormal   Collection Time: 04/07/16  9:36 PM  Result Value Ref Range   Glucose-Capillary 169 (H) 65 - 99 mg/dL  Glucose, capillary     Status: Abnormal   Collection Time: 04/08/16  6:36 AM  Result Value Ref Range   Glucose-Capillary 108 (H) 65 - 99 mg/dL  HEENT: normal Cardio: RRR and no murmur Resp: RRR and no murmur GI: BS positive and NT, ND Extremity:  Pulses positive and No Edema Skin:   Intact Neuro: Alert/Oriented, Normal Sensory, Abnormal Motor 3- bilateral HF, 4.5 B Knee ext and ankles, 5/5 in BUE and Abnormal FMC Ataxic/ dec FMC Musc/Skel:  Normal Gen NAD   Assessment/Plan: 1. Functional deficits secondary to Right MCA infarct which require 3+ hours per day of interdisciplinary therapy in a comprehensive inpatient rehab setting. Physiatrist is providing close team supervision and 24 hour management of active medical problems listed below. Physiatrist and rehab team continue to assess barriers to  discharge/monitor patient progress toward functional and medical goals. FIM: Function - Bathing Position: Wheelchair/chair at sink Body parts bathed by patient: Right arm, Left arm, Chest, Abdomen, Right upper leg, Left upper leg Body parts bathed by helper: Right lower leg, Left lower leg Bathing not applicable:  (declined washing buttocks as already donned underwear) Assist Level: Touching or steadying assistance(Pt > 75%)  Function- Upper Body Dressing/Undressing What is the patient wearing?: Bra, Pull over shirt/dress Bra - Perfomed by patient: Thread/unthread right bra strap, Thread/unthread left bra strap, Hook/unhook bra (pull down sports bra) Pull over shirt/dress - Perfomed by patient: Thread/unthread right sleeve, Thread/unthread left sleeve, Put head through opening, Pull shirt over trunk Assist Level: Set up Set up : To obtain clothing/put away Function - Lower Body Dressing/Undressing What is the patient wearing?: Pants, Shoes Position:  (arm chair in room) Underwear - Performed by patient: Pull underwear up/down Underwear - Performed by helper: Thread/unthread right underwear leg, Thread/unthread left underwear leg Pants- Performed by patient: Thread/unthread right pants leg, Thread/unthread left pants leg, Pull pants up/down Pants- Performed by helper: Thread/unthread right pants leg, Thread/unthread left pants leg Shoes - Performed by patient: Don/doff right shoe, Don/doff left shoe Assist for footwear: Independent Assist for lower body dressing: Set up Set up : To obtain clothing/put away  Function - Toileting Toileting steps completed by patient: Adjust clothing prior to toileting, Adjust clothing after toileting Toileting steps completed by helper: Performs perineal hygiene Toileting Assistive Devices: Grab bar or rail Assist level: Touching or steadying assistance (Pt.75%)  Function - Toilet Transfers Toilet transfer assistive device: Elevated toilet seat/BSC  over toilet, Walker Assist level to toilet: Supervision or verbal cues Assist level from toilet: Supervision or verbal cues Assist level to bedside commode (at bedside): Moderate assist (Pt 50 - 74%/lift or lower) Assist level from bedside commode (at bedside): Moderate assist (Pt 50 - 74%/lift or lower)  Function - Chair/bed transfer Chair/bed transfer method: Ambulatory Chair/bed transfer assist level: Touching or steadying assistance (Pt > 75%) Chair/bed transfer assistive device: Walker Chair/bed transfer details: Verbal cues for precautions/safety, Verbal cues for sequencing, Verbal cues for technique, Tactile cues for posture  Function - Locomotion: Wheelchair Will patient use wheelchair at discharge?: No Function - Locomotion: Ambulation Assistive device: Walker-rolling Max distance: 200 Assist level: Touching or steadying assistance (Pt > 75%) Assist level: Touching or steadying assistance (Pt > 75%) Walk 50 feet with 2 turns activity did not occur: Safety/medical concerns Assist level: Touching or steadying assistance (Pt > 75%) Assist level: Touching or steadying assistance (Pt > 75%) Walk 10 feet on uneven surfaces activity did not occur: Safety/medical concerns  Function - Comprehension Comprehension: Auditory Comprehension assist level: Follows complex conversation/direction with no assist  Function - Expression Expression: Verbal Expression assist level: Expresses complex 90% of the time/cues < 10% of the time  Function - Social  Interaction Social Interaction assist level: Interacts appropriately with others - No medications needed.  Function - Problem Solving Problem solving assist level: Solves complex problems: Recognizes & self-corrects  Function - Memory Memory assist level: More than reasonable amount of time Patient normally able to recall (first 3 days only): Current season, Location of own room, Staff names and faces, That he or she is in a  hospital   Medical Problem List and Plan: 1.  Left hemiparesis, facial droop  secondary to right MCA infarct with hemorrhagic transformation felt to be embolic secondary to atrial fibrillation. Cont CIR PT, OT SLP, ambulates with rolling walker min A, excellent recovery thus far 2.  DVT Prophylaxis/Anticoagulation: SCDs. Monitor for any signs of DVT 3. Pain Management/chronic back pain: Neurontin 400 mg daily at bedtime, Tramadol 100 mg daily 50 mg at bedtime as prior to admission 4. Hypertension. Coreg 6.25 mg twice a day, Lasix 40 mg daily. Monitor with increased mobility, HR ok Filed Vitals:   04/07/16 1558 04/08/16 0529  BP: 148/66 120/52  Pulse: 66 63  Temp:  98.1 F (36.7 C)  Resp:  200  5. Neuropsych: This patient is capable of making decisions on his own behalf. 6. Skin/Wound Care: Routine skin checks 7. Fluids/Electrolytes/Nutrition: Routine I&O's with follow-up chemistries 8. Atrial fibrillation. Coumadin discontinued due to hemorrhagic transformation. . Cardiac rate controlled, plan to start Eliquis in 5 days, ~5/31 9. Diabetes mellitus of peripheral neuropathy. Hemoglobin A1c 8.1. Lantus insulin 18 units daily. Check blood sugars before meals and at bedtime. Diabetic teaching 10. Diastolic congestive heart failure. Weigh patient daily. Continue Lasix 40 mg daily. Monitor for any signs of fluid overload 11. CAD status post CABG. Aspirin and Coumadin discontinued due to hemorrhagic conversion. No chest pain or shortness of breath. 12. Chronic renal insufficiency. Creatinine 1.3-1.5.repeat 5/25 improved vs baseline  13. Hyperlipidemia. Lipitor, monitor generalized weakness 14.  Psycho social : Pt concerned about cost of Eliquis (~400.00 per mo) will ask SW regarding assistance program, if none available ? Resume warfarin  LOS (Days) 4 A FACE TO FACE EVALUATION WAS PERFORMED  Candia Kingsbury E 04/08/2016, 7:36 AM

## 2016-04-09 ENCOUNTER — Inpatient Hospital Stay (HOSPITAL_COMMUNITY): Payer: Medicare Other | Admitting: Physical Therapy

## 2016-04-09 ENCOUNTER — Inpatient Hospital Stay (HOSPITAL_COMMUNITY): Payer: Medicare Other | Admitting: Occupational Therapy

## 2016-04-09 LAB — GLUCOSE, CAPILLARY
GLUCOSE-CAPILLARY: 122 mg/dL — AB (ref 65–99)
GLUCOSE-CAPILLARY: 128 mg/dL — AB (ref 65–99)
GLUCOSE-CAPILLARY: 151 mg/dL — AB (ref 65–99)
Glucose-Capillary: 118 mg/dL — ABNORMAL HIGH (ref 65–99)

## 2016-04-09 NOTE — Progress Notes (Signed)
Subjective/Complaints: Pt asking about d/c date, slept well ROS- neg CP/SOB/ , N/V/D  Objective: Vital Signs: Blood pressure 120/42, pulse 70, temperature 98.3 F (36.8 C), temperature source Oral, resp. rate 18, height 5\' 8"  (1.727 m), weight 144.335 kg (318 lb 3.2 oz), SpO2 99 %. No results found. Results for orders placed or performed during the hospital encounter of 04/04/16 (from the past 72 hour(s))  Glucose, capillary     Status: Abnormal   Collection Time: 04/06/16 11:33 AM  Result Value Ref Range   Glucose-Capillary 134 (H) 65 - 99 mg/dL  Glucose, capillary     Status: Abnormal   Collection Time: 04/06/16  5:49 PM  Result Value Ref Range   Glucose-Capillary 151 (H) 65 - 99 mg/dL  Glucose, capillary     Status: Abnormal   Collection Time: 04/06/16  9:09 PM  Result Value Ref Range   Glucose-Capillary 149 (H) 65 - 99 mg/dL  Glucose, capillary     Status: Abnormal   Collection Time: 04/07/16  7:07 AM  Result Value Ref Range   Glucose-Capillary 130 (H) 65 - 99 mg/dL  Glucose, capillary     Status: Abnormal   Collection Time: 04/07/16 11:58 AM  Result Value Ref Range   Glucose-Capillary 150 (H) 65 - 99 mg/dL  Glucose, capillary     Status: Abnormal   Collection Time: 04/07/16  4:45 PM  Result Value Ref Range   Glucose-Capillary 174 (H) 65 - 99 mg/dL  Glucose, capillary     Status: Abnormal   Collection Time: 04/07/16  9:36 PM  Result Value Ref Range   Glucose-Capillary 169 (H) 65 - 99 mg/dL  Glucose, capillary     Status: Abnormal   Collection Time: 04/08/16  6:36 AM  Result Value Ref Range   Glucose-Capillary 108 (H) 65 - 99 mg/dL  Glucose, capillary     Status: Abnormal   Collection Time: 04/08/16 12:05 PM  Result Value Ref Range   Glucose-Capillary 131 (H) 65 - 99 mg/dL  Glucose, capillary     Status: Abnormal   Collection Time: 04/08/16  4:11 PM  Result Value Ref Range   Glucose-Capillary 178 (H) 65 - 99 mg/dL  Glucose, capillary     Status: Abnormal    Collection Time: 04/08/16  9:48 PM  Result Value Ref Range   Glucose-Capillary 153 (H) 65 - 99 mg/dL  Glucose, capillary     Status: Abnormal   Collection Time: 04/09/16  7:05 AM  Result Value Ref Range   Glucose-Capillary 118 (H) 65 - 99 mg/dL     HEENT: normal Cardio: RRR and no murmur Resp: RRR and no murmur GI: BS positive and NT, ND Extremity:  Pulses positive and No Edema Skin:   Intact Neuro: Alert/Oriented, Normal Sensory, Abnormal Motor 3- bilateral HF, 4.5 B Knee ext and ankles, 5/5 in BUE and Abnormal FMC Ataxic/ dec FMC, able to clip clothes pins to cup using L hand Musc/Skel:  Normal Gen NAD   Assessment/Plan: 1. Functional deficits secondary to Right MCA infarct which require 3+ hours per day of interdisciplinary therapy in a comprehensive inpatient rehab setting. Physiatrist is providing close team supervision and 24 hour management of active medical problems listed below. Physiatrist and rehab team continue to assess barriers to discharge/monitor patient progress toward functional and medical goals. FIM: Function - Bathing Position: Shower Body parts bathed by patient: Right arm, Left arm, Chest, Abdomen, Right upper leg, Left upper leg, Front perineal area, Buttocks Body parts bathed  by helper: Right lower leg, Left lower leg Bathing not applicable:  (declined washing buttocks as already donned underwear) Assist Level: Supervision or verbal cues  Function- Upper Body Dressing/Undressing What is the patient wearing?: Pull over shirt/dress Bra - Perfomed by patient: Thread/unthread right bra strap, Thread/unthread left bra strap, Hook/unhook bra (pull down sports bra) Pull over shirt/dress - Perfomed by patient: Thread/unthread right sleeve, Thread/unthread left sleeve, Put head through opening, Pull shirt over trunk Assist Level: Set up Set up : To obtain clothing/put away Function - Lower Body Dressing/Undressing What is the patient wearing?: Pants,  Shoes Position: Other (comment) (on 3n1) Underwear - Performed by patient: Pull underwear up/down Underwear - Performed by helper: Thread/unthread right underwear leg, Thread/unthread left underwear leg Pants- Performed by patient: Thread/unthread right pants leg, Thread/unthread left pants leg, Pull pants up/down (used AE) Pants- Performed by helper: Thread/unthread right pants leg, Thread/unthread left pants leg Shoes - Performed by patient: Don/doff right shoe, Don/doff left shoe Assist for footwear: Setup Assist for lower body dressing: Set up, Assistive device Set up : To obtain clothing/put away  Function - Toileting Toileting steps completed by patient: Adjust clothing prior to toileting, Adjust clothing after toileting Toileting steps completed by helper: Adjust clothing prior to toileting, Performs perineal hygiene, Adjust clothing after toileting Toileting Assistive Devices: Grab bar or rail Assist level: Touching or steadying assistance (Pt.75%)  Function - Toilet Transfers Toilet transfer assistive device: Elevated toilet seat/BSC over toilet, Walker Assist level to toilet: Supervision or verbal cues Assist level from toilet: Supervision or verbal cues Assist level to bedside commode (at bedside): Moderate assist (Pt 50 - 74%/lift or lower) Assist level from bedside commode (at bedside): Moderate assist (Pt 50 - 74%/lift or lower)  Function - Chair/bed transfer Chair/bed transfer method: Ambulatory Chair/bed transfer assist level: Touching or steadying assistance (Pt > 75%) Chair/bed transfer assistive device: Walker Chair/bed transfer details: Verbal cues for precautions/safety, Verbal cues for sequencing, Verbal cues for technique, Tactile cues for posture  Function - Locomotion: Wheelchair Will patient use wheelchair at discharge?: No Function - Locomotion: Ambulation Assistive device: Walker-rolling (rollator) Max distance: 100 ft Assist level: Supervision or verbal  cues Assist level: Supervision or verbal cues Walk 50 feet with 2 turns activity did not occur: Safety/medical concerns Assist level: Supervision or verbal cues Assist level: Supervision or verbal cues Walk 10 feet on uneven surfaces activity did not occur: Safety/medical concerns  Function - Comprehension Comprehension: Auditory Comprehension assist level: Follows complex conversation/direction with no assist  Function - Expression Expression: Verbal Expression assist level: Expresses basic needs/ideas: With no assist  Function - Social Interaction Social Interaction assist level: Interacts appropriately with others with medication or extra time (anti-anxiety, antidepressant).  Function - Problem Solving Problem solving assist level: Solves basic 90% of the time/requires cueing < 10% of the time  Function - Memory Memory assist level: More than reasonable amount of time Patient normally able to recall (first 3 days only): Current season, Location of own room, Staff names and faces, That he or she is in a hospital   Medical Problem List and Plan: 1.  Left hemiparesis, facial droop  secondary to right MCA infarct with hemorrhagic transformation felt to be embolic secondary to atrial fibrillation. Cont CIR PT, OT SLP, ambulates with rolling walker min A, excellent recovery thus far, CT head on 5/26 shows stable R BG ICH, functionally improving expect D/C later this week 2.  DVT Prophylaxis/Anticoagulation: SCDs. Monitor for any signs of DVT 3. Pain  Management/chronic back pain: Neurontin 400 mg daily at bedtime, Tramadol 100 mg daily 50 mg at bedtime as prior to admission 4. Hypertension. Coreg 6.25 mg twice a day, Lasix 40 mg daily. Monitor with increased mobility, HR ok Filed Vitals:   04/08/16 1449 04/09/16 0542  BP: 117/51 120/42  Pulse: 73 70  Temp: 97.8 F (36.6 C) 98.3 F (36.8 C)  Resp: 18 18  5. Neuropsych: This patient is capable of making decisions on his own  behalf. 6. Skin/Wound Care: Routine skin checks 7. Fluids/Electrolytes/Nutrition: Routine I&O's with follow-up chemistries 8. Atrial fibrillation. Coumadin discontinued due to hemorrhagic transformation. . Cardiac rate controlled, plan to start Eliquis in 5 days, ~5/31 9. Diabetes mellitus of peripheral neuropathy. Hemoglobin A1c 8.1. Lantus insulin 18 units daily. Check blood sugars before meals and at bedtime. Diabetic teaching 10. Diastolic congestive heart failure. Weigh patient daily. Continue Lasix 40 mg daily. Monitor for any signs of fluid overload 11. CAD status post CABG. Aspirin and Coumadin discontinued due to hemorrhagic conversion. No chest pain or shortness of breath. 12. Chronic renal insufficiency. Creatinine 1.3-1.5.repeat 5/25 improved vs baseline  13. Hyperlipidemia. Lipitor, monitor generalized weakness 14.  Psycho social : Pt concerned about cost of Eliquis (~400.00 per mo) will ask SW regarding assistance program, if none available ? Resume warfarin  LOS (Days) 5 A FACE TO FACE EVALUATION WAS PERFORMED  KIRSTEINS,ANDREW E 04/09/2016, 8:59 AM

## 2016-04-09 NOTE — Progress Notes (Signed)
Physical Therapy Session Note  Patient Details  Name: [name] MRN: [id number] Date of Birth: July 27, 1941  Today's Date: 04/09/2016 PT Individual Time: 1000-1100 PT Individual Time Calculation (min): 60 min   Short Term Goals: Week 1:  PT Short Term Goal 1 (Week 1): Pt will perform bed mobility minA PT Short Term Goal 2 (Week 1): Pt will perform stand pivot transfer with consistent S PT Short Term Goal 3 (Week 1): Pt will perform gait x150' with rollator and S PT Short Term Goal 4 (Week 1): Pt will perform ascent/descent of 4 6-inch stairs with B handrails and min guard  Skilled Therapeutic Interventions/Progress Updates:    Pt received seated in recliner with c/o back pain as described below and agreeable to treatment. Ambulated into bathroom with rollator and S. Pt performs clothing management however requests assist for peri hygiene and states "I can reach but I can't get myself quite dry enough". Gait 4x200' with rollator and S. Requires several minutes for each seated rest break due to fatigue. Gait on ramp with S and over uneven surface with min guard, both using rollator. Standing tolerance of 12 min while engaged in standing game requiring dynamic BUE use. Gait to return to room 2x200' with rollator and S. Ambulated into bathroom with S, and remained seated on toilet at completion of session with instruction to use call bell for assist when finished; RN alerted to pt position.   Therapy Documentation Precautions:  Precautions Precautions: Fall Precaution Comments: left knee weakness Restrictions Weight Bearing Restrictions: No Pain: Pain Assessment Pain Score: 7  Pain Type: Chronic pain Pain Location: Back Pain Orientation: Lower Pain Descriptors / Indicators: Aching;Constant;Discomfort Pain Intervention(s): Medication (See eMAR)   See Function Navigator for Current Functional Status.   Therapy/Group: Individual Therapy  Luberta Mutter 04/09/2016, 11:53 AM

## 2016-04-09 NOTE — Progress Notes (Signed)
Physical Therapy Session Note  Patient Details  Name: [name] MRN: [id number] Date of Birth: 1941/04/13  Today's Date: 04/09/2016 PT Individual Time: 1515-1602 PT Individual Time Calculation (min): 47 min   Short Term Goals: Week 1:  PT Short Term Goal 1 (Week 1): Pt will perform bed mobility minA PT Short Term Goal 2 (Week 1): Pt will perform stand pivot transfer with consistent S PT Short Term Goal 3 (Week 1): Pt will perform gait x150' with rollator and S PT Short Term Goal 4 (Week 1): Pt will perform ascent/descent of 4 6-inch stairs with B handrails and min guard  Skilled Therapeutic Interventions/Progress Updates:   Pt received in recliner resting; pt reporting back pain.  RN notified.  Pt performed transfers from recliner, ambulation to/from toilet with RW, clothing management and static standing at toilet while therapist performed hygiene with supervision but continues to demonstrate increased dependence on UE support on RW.  Pt performed ambulation x 100' x 4 reps with supervision and intermittent seated rest breaks on rollator due to back pain.  In gym pt performed BERG balance assessment with rest breaks due to back pain.  Discussed falls risk with pt, areas of impairment and recommendations for safety at home.  Pt demonstrated heavy reliance on UE with poor lateral weight shifting in standing.  Pt returned to room and to recliner with supervision.  Pt left with all items within reach.     Therapy Documentation Precautions:  Precautions Precautions: Fall Precaution Comments: left knee weakness Restrictions Weight Bearing Restrictions: No Vital Signs: Therapy Vitals Temp: 98.3 F (36.8 C) Temp Source: Oral Pulse Rate: 70 Resp: 17 BP: (!) 126/57 mmHg Patient Position (if appropriate): Sitting Oxygen Therapy SpO2: 98 % O2 Device: Not Delivered Pain: Pain Assessment Pain Score: 9  Pain Type: Chronic pain Pain Location: Back Pain Descriptors / Indicators:  Aching Pain Intervention(s): Medication (See eMAR) Balance: Standardized Balance Assessment Standardized Balance Assessment: Berg Balance Test Berg Balance Test Sit to Stand: Able to stand without using hands and stabilize independently Standing Unsupported: Able to stand 2 minutes with supervision Sitting with Back Unsupported but Feet Supported on Floor or Stool: Able to sit safely and securely 2 minutes Stand to Sit: Sits safely with minimal use of hands Transfers: Able to transfer safely, definite need of hands Standing Unsupported with Eyes Closed: Able to stand 10 seconds with supervision Standing Ubsupported with Feet Together: Able to place feet together independently but unable to hold for 30 seconds From Standing, Reach Forward with Outstretched Arm: Loses balance while trying/requires external support From Standing Position, Pick up Object from Floor: Able to pick up shoe, needs supervision From Standing Position, Turn to Look Behind Over each Shoulder: Looks behind one side only/other side shows less weight shift Turn 360 Degrees: Needs assistance while turning Standing Unsupported, Alternately Place Feet on Step/Stool: Able to complete >2 steps/needs minimal assist Standing Unsupported, One Foot in Front: Needs help to step but can hold 15 seconds Standing on One Leg: Unable to try or needs assist to prevent fall Total Score: 31  Patient demonstrates increased fall risk as noted by score of  31/56 on Berg Balance Scale.  (<36= high risk for falls, close to 100%; 37-45 significant >80%; 46-51 moderate >50%; 52-55 lower >25%)  See Function Navigator for Current Functional Status.   Therapy/Group: Individual Therapy  Raylene Everts [facility] 04/09/2016, 4:23 PM

## 2016-04-09 NOTE — Progress Notes (Signed)
Occupational Therapy Session Note  Patient Details  Name: [name] MRN: [id number] Date of Birth: 1940-11-13  Today's Date: 04/09/2016 OT Individual Time: 0730-0900 OT Individual Time Calculation (min): 90 min    Short Term Goals: Week 1:  OT Short Term Goal 1 (Week 1): Pt will complete LB dressing with min assist OT Short Term Goal 2 (Week 1): Pt will complete walk-in shower transfers with min assist OT Short Term Goal 3 (Week 1): Pt will complete toilet transfers with min assist OT Short Term Goal 4 (Week 1): Pt will complete 2 grooming tasks in standing for increased activity tolerance  Skilled Therapeutic Interventions/Progress Updates:    Treatment session with focus on activity tolerance and increased participation in self-care and home management tasks. Provided education while pt ate breakfast; discussed energy conservation techniques, home modifications pertinent to performance of valued occupations, and reviewed pt concerns regarding return home. Demonstrated use of reacher for LB dressing for improved energy conservation. Pt stated edema present at ankles in BLE due to not elevating BLEs while sleeping as done at home. All other dressing tasks completed at level of Mod I. Transferred to rehab apartment with x2 rests from room < > apartment for approx. 1-2 minutes each to observe pt simulation of gathering simple meal prep materials. Pt demonstrated steps taken to gather materials while vocalizing reasoning for sequences. Pt demonstrates good use of BUEs, safety awareness, and endurance for completing valued occupations with sound reasoning and steady home supports.   Written by Dierdre Searles, OTS  I have read and agree with above note. Simonne Come   Therapy Documentation Precautions:  Precautions Precautions: Fall Precaution Comments: left knee weakness Restrictions Weight Bearing Restrictions: No Pain:  Pt reports pain 8/10 in back, RN notified. RN provided meds  during session.  See Function Navigator for Current Functional Status.   Therapy/Group: Individual Therapy  Dugan Vanhoesen, [place] 04/09/2016, 1:50 PM

## 2016-04-10 ENCOUNTER — Inpatient Hospital Stay (HOSPITAL_COMMUNITY): Payer: Medicare Other | Admitting: Physical Therapy

## 2016-04-10 ENCOUNTER — Inpatient Hospital Stay (HOSPITAL_COMMUNITY): Payer: Medicare Other | Admitting: Occupational Therapy

## 2016-04-10 LAB — GLUCOSE, CAPILLARY
GLUCOSE-CAPILLARY: 117 mg/dL — AB (ref 65–99)
GLUCOSE-CAPILLARY: 140 mg/dL — AB (ref 65–99)
GLUCOSE-CAPILLARY: 137 mg/dL — AB (ref 65–99)

## 2016-04-10 NOTE — Progress Notes (Signed)
Physical Therapy Session Note  Patient Details  Name: [name] MRN: [id number] Date of Birth: 1941-05-15  Today's Date: 04/10/2016 PT Individual Time: 0900-1000 and 1500-1530 PT Individual Time Calculation (min): 60 min and 30 min (total 90 min)   Short Term Goals: Week 1:  PT Short Term Goal 1 (Week 1): Pt will perform bed mobility minA PT Short Term Goal 2 (Week 1): Pt will perform stand pivot transfer with consistent S PT Short Term Goal 3 (Week 1): Pt will perform gait x150' with rollator and S PT Short Term Goal 4 (Week 1): Pt will perform ascent/descent of 4 6-inch stairs with B handrails and min guard  Skilled Therapeutic Interventions/Progress Updates:    Tx 1: Pt received seated in recliner, c/o pain as below and agreeable to treatment. Gait to/from bathroom with rollator and S. Performs clothing management, but requires totalA for peri hygiene with heavy UE reliance on rollator. Gait 2x150' with rollator and S; seated rest break between trials due to fatigue and back pain. Nustep x10 min with BUE/BLE for strengthening and endurance. Standing balance on balance foam and balance wedge for ankle strategy, reaching outside BOS, righting reactions and facilitation for upright posture. Gait to return to room with rollator x200' without rest break, pt distracted by son present. Remained seated in recliner at completion of session, all needs within reach.   Tx 2: Pt received seated in recliner, c/o pain as below and agreeable to treatment. Gait to/from bathroom with rollator and S. Performed all clothing management and hygiene with distant S. Pt requests to stay in room due to back pain and fatigue. Sit <>stand x10 reps for LE strength; S for safety. Pt instructed to perform previously administered LE HEP with use of written directions, as independently as possible. Performed 1 set 15 reps all seated exercises with min cues for technique. Remained seated in recliner at completion of session,  all needs within reach.   Therapy Documentation Precautions:  Precautions Precautions: Fall Precaution Comments: left knee weakness Restrictions Weight Bearing Restrictions: No Pain: Pain Assessment Pain Score: 7  Pain Type: Chronic pain Pain Location: Back Pain Descriptors / Indicators: Aching Pain Intervention(s): Medication (See eMAR)   See Function Navigator for Current Functional Status.   Therapy/Group: Individual Therapy  Luberta Mutter 04/10/2016, 3:21 PM

## 2016-04-10 NOTE — Progress Notes (Addendum)
Occupational Therapy Session Note  Patient Details  Name: [name] MRN: [id number] Date of Birth: [birth year]  Today's Date: 04/10/2016 OT Individual Time: 0730-0830 and 1300-1400 OT Individual Time Calculation (min): 60 min and 60 min   Short Term Goals: Week 1:  OT Short Term Goal 1 (Week 1): Pt will complete LB dressing with min assist OT Short Term Goal 2 (Week 1): Pt will complete walk-in shower transfers with min assist OT Short Term Goal 3 (Week 1): Pt will complete toilet transfers with min assist OT Short Term Goal 4 (Week 1): Pt will complete 2 grooming tasks in standing for increased activity tolerance  Skilled Therapeutic Interventions/Progress Updates:    1) Treatment session with focus on activity tolerance and self-care retraining.  Pt ambulated to room shower with Rollator and completed walk-in shower transfer with use of grab bar and tub bench after setup.  Provided pt with long handled sponge to increase independence with LB bathing.  Dressing completed with setup assist and pt requiring assist to don socks.  Pt reports her son assists with socks when she wears them.  Grooming tasks completed in standing at sink for increased standing tolerance.  Engaged in familiar card game in standing with focus on standing tolerance and functional use of LUE with shuffling, dealing, and flipping cards.  2) Treatment session with focus on bathroom transfers and overall activity tolerance.  Upon arrival, pt reports needing to toilet.  Ambulated to toilet with supervision/setup to open door and make sure BSC over toilet.  Pt reports difficulty with hygiene in hospital setup but was able to complete without assistance this session.  Completed hand hygiene in standing post toileting.  Discussed home shower setup and typical method of entering walk-in shower.  Plan to further address during future session with stepping over simulated shower ledge.  Pt ambulated 100 feet with Rollator without  seated rest break.  Engaged in familiar [place] activity in standing with focus on activity tolerance and functional use of LUE with selecting and flipping over tiles with Lt hand.  Pt tolerated standing 4, 6, and 5 mins before requiring 1-2 min seated rest break.  Returned to room as above and left seated in recliner with all needs in reach.  Therapy Documentation Precautions:  Precautions Precautions: Fall Precaution Comments: left knee weakness Restrictions Weight Bearing Restrictions: No Pain: Pain Assessment Pain Score: 7  Pain Type: Chronic pain Pain Location: Back Pain Descriptors / Indicators: Aching Pain Intervention(s): Medication (See eMAR)  See Function Navigator for Current Functional Status.   Therapy/Group: Individual Therapy  [name] 04/10/2016, 10:57 AM

## 2016-04-11 ENCOUNTER — Inpatient Hospital Stay (HOSPITAL_COMMUNITY): Payer: Medicare Other | Admitting: Physical Therapy

## 2016-04-11 ENCOUNTER — Inpatient Hospital Stay (HOSPITAL_COMMUNITY): Payer: Medicare Other | Admitting: Occupational Therapy

## 2016-04-11 LAB — GLUCOSE, CAPILLARY
GLUCOSE-CAPILLARY: 163 mg/dL — AB (ref 65–99)
Glucose-Capillary: 113 mg/dL — ABNORMAL HIGH (ref 65–99)
Glucose-Capillary: 113 mg/dL — ABNORMAL HIGH (ref 65–99)
Glucose-Capillary: 120 mg/dL — ABNORMAL HIGH (ref 65–99)

## 2016-04-11 MED ORDER — APIXABAN 5 MG PO TABS
5.0000 mg | ORAL_TABLET | Freq: Two times a day (BID) | ORAL | Status: DC
  Administered 2016-04-11 – 2016-04-12 (×3): 5 mg via ORAL
  Filled 2016-04-11 (×3): qty 1

## 2016-04-11 NOTE — Progress Notes (Signed)
Occupational Therapy Discharge Summary  Patient Details  Name: [name] MRN: [id number] Date of Birth: [birth year]  Patient has met 11 of 11 long term goals due to improved activity tolerance, improved balance, postural control, ability to compensate for deficits, functional use of  LEFT upper and LEFT lower extremity and improved awareness.  Patient to discharge at overall Modified Independent level, except min assist with LB bathing and dressing.  Patient's care partner is independent to provide the necessary physical assistance at discharge.    Reasons goals not met: N/A  Recommendation:  Patient will benefit from ongoing skilled OT services in home health setting to continue to advance functional skills in the area of BADL and Reduce care partner burden.  Equipment: No equipment provided  Reasons for discharge: treatment goals met and discharge from hospital  Patient/family agrees with progress made and goals achieved: Yes  OT Discharge Precautions/Restrictions  Precautions Precautions: Fall Restrictions Weight Bearing Restrictions: No Pain Pain Assessment Pain Score: 4  ADL  See Function Navigator Vision/Perception  Vision- History Baseline Vision/History: Wears glasses Wears Glasses: Reading only Patient Visual Report: No change from baseline Vision- Assessment Vision Assessment?: No apparent visual deficits  Cognition Overall Cognitive Status: Within Functional Limits for tasks assessed Arousal/Alertness: Awake/alert Orientation Level: Oriented X4 Attention: Selective Selective Attention: Appears intact Memory: Appears intact Awareness: Appears intact Problem Solving: Appears intact Safety/Judgment: Appears intact Sensation Sensation Light Touch: Appears Intact Stereognosis: Not tested Hot/Cold: Not tested Proprioception: Appears Intact Coordination Fine Motor Movements are Fluid and Coordinated: No Finger Nose Finger Test: mildly slower Lt > Rt 9  Hole Peg Test: Rt: 31 seconds, Lt: 50 seconds Extremity/Trunk Assessment RUE Assessment RUE Assessment: Within Functional Limits (ROM WFL, strength grossly 4+/5) LUE Assessment LUE Assessment: Within Functional Limits (ROM WFL, strength grossly 4/5 overall)   See Function Navigator for Current Functional Status.  Simonne Come 04/11/2016, 1:55 PM

## 2016-04-11 NOTE — Progress Notes (Signed)
Physical Therapy Session Note  Patient Details  Name: [name] MRN: [id number] Date of Birth: 06/28/1941  Today's Date: 04/11/2016 PT Individual Time: 1530-1643    AND 900-959 PT Individual Time Calculation (min): 73 min  AND 59 minutes.   Short Term Goals: Week 1:  PT Short Term Goal 1 (Week 1): Pt will perform bed mobility minA PT Short Term Goal 2 (Week 1): Pt will perform stand pivot transfer with consistent S PT Short Term Goal 3 (Week 1): Pt will perform gait x150' with rollator and S PT Short Term Goal 4 (Week 1): Pt will perform ascent/descent of 4 6-inch stairs with B handrails and min guard  Skilled Therapeutic Interventions/Progress Updates:    Patient received sitting in recliner and agreeable to PT.  Patient performed gait training for 170f, 1542fx 2 with Rollator at Mod I level, and was noted to required increased time and effort with increased distance.   PT instructed patient in Gait on handicap access ramp for 1532fith supervision A from PT with min cues for AD management. Patient also performed gait on uneven surface for 80f31fth supervision A from PT.   Patient instructed in car transfer with supervision A at 24 inches to simulate car transfers at home with Rollator. Min cues from PT for proper technique and UP placement   Gait training in hospital gift shop for simmunlated community environment x 150ft39fh rollator, no cues or assistance provided by PT, but noted that patient required increased time and effort with obstacle navigation.   All transfers completed without cues or assistance from PT with rollator for balance from various sitting heights x 10.    Session 2  Gait training with Rollator for 75ft 61f 150ft x107fn controlled environment. Gait training on carpet without cues from PT for  150ft an79fft usi26follator.    Berg balance test: see flow sheet.   Patient scored 37/56 Patient demonstrates increased fall risk as noted by score of  37/56 on  Berg Balance Scale.  (<36= high risk for falls, close to 100%; 37-45 significant >80%; 46-51 moderate >50%; 52-55 lower >25%)   Stair training x 4 steps with supervision A and min cues for UE placement. .   Bed transfer to 26 inches with backwards ascent onto 3 inch step for simulated transfer to bed at home. PT provided min cues for proper technique and AD management as well as min cues for LE management into bed with sit<>supine transfer.  Patient left sitting in recliner with all needs met.   Therapy Documentation Precautions:  Precautions Precautions: Fall Precaution Comments: left knee weakness Restrictions Weight Bearing Restrictions: No General:   Vital Signs: Therapy Vitals Temp: 98.3 F (36.8 C) Temp Source: Oral Pulse Rate: 77 Resp: 18 BP: (!) 151/45 mmHg Patient Position (if appropriate): Sitting Oxygen Therapy SpO2: 97 % O2 Device: Not Delivered Pain: Pain Assessment Pain Assessment: 0-10 Pain Score: 6  Pain Type: Chronic pain Pain Location: Back Pain Orientation: Lower Pain Descriptors / Indicators: Aching Pain Frequency: Constant Pain Onset: On-going Patients Stated Pain Goal: 2 Pain Intervention(s): Medication (See eMAR) Multiple Pain Sites: No  See Function Navigator for Current Functional Status.   Therapy/Group: Individual Therapy  [name] [name] [name], 5:19 PM

## 2016-04-11 NOTE — Progress Notes (Signed)
Social Work Patient ID: [name], female   DOB: 1941-09-08, 75 y.o.   MRN: [id number] Met with pt and son who was here to inform of team conference goals mod/i level and target discharge 6/1. Pt started on Eliquis and will observe today. Pt is pleased she feels this way she will not lose her aide she had a few hours per day. Son to contact [facility] to inform of the date. Discussed follow up home health and she has no preference. Will make referral and work on discharge for tomorrow.

## 2016-04-11 NOTE — Discharge Summary (Signed)
Discharge summary job [id number]

## 2016-04-11 NOTE — Patient Care Conference (Signed)
Inpatient RehabilitationTeam Conference and Plan of Care Update Date: 04/11/2016   Time: 11:15 AM    Patient Name: [name]      Medical Record Number: [id number]  Date of Birth: 08-20-1941 Sex: Female         Room/Bed: 4W23C/4W23C-01 Payor Info: Payor: Joette Catching MEDICARE / Plan: [employer] MEDICARE / Product Type: *No Product type* /    Admitting Diagnosis: R CVA  Admit Date/Time:  04/04/2016  2:44 PM Admission Comments: No comment available   Primary Diagnosis:  <principal problem not specified> Principal Problem: <principal problem not specified>  Patient Active Problem List   Diagnosis Date Noted  . Right middle cerebral artery stroke (HCC) 04/04/2016  . Facial weakness, post-stroke   . Hemiparesis (HCC)   . Persistent atrial fibrillation (HCC)   . Chronic pain syndrome   . HLD (hyperlipidemia)   . Chronic diastolic congestive heart failure (HCC)   . Coronary artery disease involving coronary bypass graft of native heart without angina pectoris   . CKD (chronic kidney disease)   . E-coli UTI   . Chronic combined systolic and diastolic congestive heart failure (HCC)   . Benign essential HTN   . DM type 2 with diabetic peripheral neuropathy (HCC)   . Tachypnea   . Thrombocytopenia (HCC)   . UTI (urinary tract infection) 04/01/2016  . Acute CVA (cerebrovascular accident) (HCC) 03/31/2016  . Acute hemorrhagic infarction of brain (HCC)   . Cerebrovascular accident (CVA) due to embolism of right middle cerebral artery (HCC)   . Subtherapeutic international normalized ratio (INR)   . CVA (cerebral vascular accident) (HCC) 03/30/2016  . CKD (chronic kidney disease) stage 3, GFR 30-59 ml/min 03/30/2016  . Chronic anticoagulation 03/30/2016  . Diabetes (HCC) 01/31/2009  . Hyperlipidemia 01/31/2009  . ANXIETY 01/31/2009  . Coronary atherosclerosis 01/31/2009  . CAD, AUTOLOGOUS BYPASS GRAFT 01/31/2009  . CARDIOMYOPATHY, ISCHEMIC 01/31/2009  . ATRIAL FIBRILLATION  01/31/2009  . SYSTOLIC HEART FAILURE, CHRONIC 01/31/2009  . TACHYCARDIA, HX OF 01/31/2009    Expected Discharge Date: Expected Discharge Date: 04/12/16  Team Members Present: Physician leading conference: Dr. Claudette Laws Social Worker Present: Dossie Der, LCSW Nurse Present: Kennon Portela, RN PT Present: Alyson Reedy, Nita Sickle, PT OT Present: Rosalio Loud, OT SLP Present: Jackalyn Lombard, SLP PPS Coordinator present : Tora Duck, RN, CRRN     Current Status/Progress Goal Weekly Team Focus  Medical   pt restarted on anticoagulation  Home on anticoagulation for secondary CVA prophyllaxis without evidence of rebleed  D/C planning , monitor Neuro status after initiation of Eliquis   Bowel/Bladder   pt continent of bowel and bladder  mod I  contiune POC    Swallow/Nutrition/ Hydration             ADL's   supervision/setup overall  Mod I overall, except min assist bathing and LB dressing  activity tolerance/endurance, sit > stand, pt/family education   Mobility   mod I bed mobility, transfers, gait short distances with RW, S stairs and community mobility  mod I overall, S for car transfers, stairs, and community ambulation  Pt/family education, HEP, activity tolerance   Communication             Safety/Cognition/ Behavioral Observations            Pain   chronic back pain. schedued ultram with PRN 650mg  tylenol q4hrs  5 or less  assess pain and medicate as needed   Skin   scattered bruising to exterimites  free of skin breakdown min assist  assess skin q shift      *See Care Plan and progress notes for long and short-term goals.  Barriers to Discharge: see above    Possible Resolutions to Barriers:  home in D/C if no neuro decline    Discharge Planning/Teaching Needs:  Home with son who can arrange to be there for a short time, but otherwise is there in the evenings. 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      Team Discussion:   Goals mod/i level except for bathing and LE dressing. Started on Eliquis today for A-fib, observe today. Back pain being managed with ultram. Ready to go home, will resume her [facility] aide. Son will be there in the evenings.  Revisions to Treatment Plan:  DC Tomorrow   Continued Need for Acute Rehabilitation Level of Care: The patient requires daily medical management by a physician with specialized training in physical medicine and rehabilitation for the following conditions: Daily direction of a multidisciplinary physical rehabilitation program to ensure safe treatment while eliciting the highest outcome that is of practical value to the patient.: Yes Daily medical management of patient stability for increased activity during participation in an intensive rehabilitation regime.: Yes Daily analysis of laboratory values and/or radiology reports with any subsequent need for medication adjustment of medical intervention for : Neurological problems  Kester Stimpson, Lemar Livings 04/11/2016, 12:58 PM

## 2016-04-11 NOTE — Progress Notes (Signed)
Occupational Therapy Session Note  Patient Details  Name: [name] MRN: [id number] Date of Birth: 08/15/1941  Today's Date: 04/11/2016 OT Individual Time: 1133-1203 OT Individual Time Calculation (min): 30 min    Short Term Goals: Week 1:  OT Short Term Goal 1 (Week 1): Pt will complete LB dressing with min assist OT Short Term Goal 2 (Week 1): Pt will complete walk-in shower transfers with min assist OT Short Term Goal 3 (Week 1): Pt will complete toilet transfers with min assist OT Short Term Goal 4 (Week 1): Pt will complete 2 grooming tasks in standing for increased activity tolerance  Skilled Therapeutic Interventions/Progress Updates:    Treatment session with focus on bathroom transfers in home shower and improvements in LUE strength and coordination for improved occupational performance in self-care and other valued occupations. Pt successfully performed simulated shower step up and step in using 3.25" high wooden shower floor simulator box. Pt demonstrated good use of RW when stepping into and out of simulated shower box with good safety awareness via slow, cautious stepping.  Pt also performed 9-hole peg test with results of: L = 0:50; R = 0:31 at BUE for fine motor, hand-eye coordination, gross motor, and sensation.   Therapy Documentation Precautions:  Precautions Precautions: Fall Precaution Comments: left knee weakness Restrictions Weight Bearing Restrictions: No General:   Vital Signs:  Pain: Pain Assessment Pain Assessment: 0-10 Pain Score: 4  Pain Location: Back Pain Orientation: Lower Pain Descriptors / Indicators: Stabbing Pain Intervention(s): Medication (See eMAR)  See Function Navigator for Current Functional Status.   Therapy/Group: Individual Therapy  Dierdre Searles 04/11/2016, 12:21 PM

## 2016-04-11 NOTE — Progress Notes (Signed)
Subjective/Complaints: No issues overnite.  Has tingling in 4th and 5th digits bilaterally since CABG in 2009 ROS- neg CP/SOB/ , N/V/D  Objective: Vital Signs: Blood pressure 125/59, pulse 62, temperature 97.7 F (36.5 C), temperature source Oral, resp. rate 18, height '5\' 8"'$  (1.727 m), weight 139.8 kg (308 lb 3.3 oz), SpO2 99 %. No results found. Results for orders placed or performed during the hospital encounter of 04/04/16 (from the past 72 hour(s))  Glucose, capillary     Status: Abnormal   Collection Time: 04/08/16 12:05 PM  Result Value Ref Range   Glucose-Capillary 131 (H) 65 - 99 mg/dL  Glucose, capillary     Status: Abnormal   Collection Time: 04/08/16  4:11 PM  Result Value Ref Range   Glucose-Capillary 178 (H) 65 - 99 mg/dL  Glucose, capillary     Status: Abnormal   Collection Time: 04/08/16  9:48 PM  Result Value Ref Range   Glucose-Capillary 153 (H) 65 - 99 mg/dL  Glucose, capillary     Status: Abnormal   Collection Time: 04/09/16  7:05 AM  Result Value Ref Range   Glucose-Capillary 118 (H) 65 - 99 mg/dL  Glucose, capillary     Status: Abnormal   Collection Time: 04/09/16 11:45 AM  Result Value Ref Range   Glucose-Capillary 122 (H) 65 - 99 mg/dL  Glucose, capillary     Status: Abnormal   Collection Time: 04/09/16  4:28 PM  Result Value Ref Range   Glucose-Capillary 128 (H) 65 - 99 mg/dL   Comment 1 Notify RN   Glucose, capillary     Status: Abnormal   Collection Time: 04/09/16  9:45 PM  Result Value Ref Range   Glucose-Capillary 151 (H) 65 - 99 mg/dL  Glucose, capillary     Status: Abnormal   Collection Time: 04/10/16  6:49 AM  Result Value Ref Range   Glucose-Capillary 117 (H) 65 - 99 mg/dL  Glucose, capillary     Status: Abnormal   Collection Time: 04/10/16 11:51 AM  Result Value Ref Range   Glucose-Capillary 140 (H) 65 - 99 mg/dL  Glucose, capillary     Status: Abnormal   Collection Time: 04/10/16  4:28 PM  Result Value Ref Range    Glucose-Capillary 137 (H) 65 - 99 mg/dL  Glucose, capillary     Status: Abnormal   Collection Time: 04/11/16  6:53 AM  Result Value Ref Range   Glucose-Capillary 113 (H) 65 - 99 mg/dL     HEENT: normal Cardio: RRR and no murmur Resp: RRR and no murmur GI: BS positive and NT, ND Extremity:  Pulses positive and No Edema Skin:   Intact Neuro: Alert/Oriented, Normal Sensory, Abnormal Motor 4- bilateral HF, 4.5 B Knee ext and ankles, 5/5 in BUE and Abnormal FMC Ataxic/ dec FMC, has parasthesia in B 4th and 5th digits Musc/Skel:  Normal Gen NAD   Assessment/Plan: 1. Functional deficits secondary to Right MCA infarct which require 3+ hours per day of interdisciplinary therapy in a comprehensive inpatient rehab setting. Physiatrist is providing close team supervision and 24 hour management of active medical problems listed below. Physiatrist and rehab team continue to assess barriers to discharge/monitor patient progress toward functional and medical goals. FIM: Function - Bathing Position: Shower Body parts bathed by patient: Right arm, Left arm, Chest, Abdomen, Front perineal area, Buttocks, Right upper leg, Left upper leg Body parts bathed by helper: Right lower leg, Left lower leg, Back Bathing not applicable:  (declined washing buttocks as  already donned underwear) Assist Level: Set up  Function- Upper Body Dressing/Undressing What is the patient wearing?: Bra, Pull over shirt/dress Bra - Perfomed by patient: Thread/unthread right bra strap, Thread/unthread left bra strap Bra - Perfomed by helper: Hook/unhook bra (pull down sports bra) Pull over shirt/dress - Perfomed by patient: Thread/unthread right sleeve, Thread/unthread left sleeve, Put head through opening, Pull shirt over trunk Assist Level: Set up Set up : To obtain clothing/put away Function - Lower Body Dressing/Undressing What is the patient wearing?: Underwear, Pants, Non-skid slipper socks Position: Other (comment)  (Clothing donned in bathroom at toilet.) Underwear - Performed by patient: Thread/unthread right underwear leg, Thread/unthread left underwear leg, Pull underwear up/down Underwear - Performed by helper: Thread/unthread right underwear leg, Thread/unthread left underwear leg Pants- Performed by patient: Thread/unthread right pants leg, Thread/unthread left pants leg, Pull pants up/down Pants- Performed by helper: Thread/unthread right pants leg, Thread/unthread left pants leg Non-skid slipper socks- Performed by helper: Don/doff right sock, Don/doff left sock Shoes - Performed by patient: Don/doff right shoe, Don/doff left shoe Assist for footwear: Dependant Assist for lower body dressing: Set up, Assistive device, More than reasonable time, Touching or steadying assistance (Pt > 75%) Assistive Device Comment: Reacher Set up : To obtain clothing/put away  Function - Toileting Toileting steps completed by patient: Adjust clothing prior to toileting, Performs perineal hygiene, Adjust clothing after toileting Toileting steps completed by helper: Performs perineal hygiene Toileting Assistive Devices: Grab bar or rail Assist level: Supervision or verbal cues  Function - Toilet Transfers Toilet transfer assistive device: Elevated toilet seat/BSC over toilet, Grab bar Assist level to toilet: Set up only Assist level from toilet: Set up only Assist level to bedside commode (at bedside): 2 helpers (per Gilman Buttner, NT 2 helpers needed for bsc) Assist level from bedside commode (at bedside): Moderate assist (Pt 50 - 74%/lift or lower)  Function - Chair/bed transfer Chair/bed transfer method: Ambulatory Chair/bed transfer assist level: Supervision or verbal cues Chair/bed transfer assistive device: Walker, Armrests Chair/bed transfer details: Verbal cues for precautions/safety, Verbal cues for sequencing, Verbal cues for technique, Tactile cues for posture  Function - Locomotion:  Wheelchair Will patient use wheelchair at discharge?: No Function - Locomotion: Ambulation Assistive device:  (rollator) Max distance: 200 Assist level: Supervision or verbal cues Assist level: Supervision or verbal cues Walk 50 feet with 2 turns activity did not occur: Safety/medical concerns Assist level: Supervision or verbal cues Assist level: Supervision or verbal cues Walk 10 feet on uneven surfaces activity did not occur: Safety/medical concerns Assist level: Touching or steadying assistance (Pt > 75%)  Function - Comprehension Comprehension: Auditory Comprehension assist level: Follows complex conversation/direction with no assist  Function - Expression Expression: Verbal Expression assist level: Expresses complex ideas: With no assist  Function - Social Interaction Social Interaction assist level: Interacts appropriately with others with medication or extra time (anti-anxiety, antidepressant).  Function - Problem Solving Problem solving assist level: Solves complex 90% of the time/cues < 10% of the time  Function - Memory Memory assist level: More than reasonable amount of time Patient normally able to recall (first 3 days only): Current season, Location of own room, Staff names and faces, That he or she is in a hospital   Medical Problem List and Plan: 1.  Left hemiparesis, facial droop  secondary to right MCA infarct with hemorrhagic transformation felt to be embolic secondary to atrial fibrillation. Cont CIR PT, OT SLP,Team conference today please see physician documentation under team conference tab, met  with team face-to-face to discuss problems,progress, and goals. Formulized individual treatment plan based on medical history, underlying problem and comorbidities.2.  DVT Prophylaxis/Anticoagulation: SCDs. Monitor for any signs of DVT 3. Pain Management/chronic back pain: Neurontin 400 mg daily at bedtime, Tramadol 100 mg daily 50 mg at bedtime as prior to  admission 4. Hypertension. Coreg 6.25 mg twice a day, Lasix 40 mg daily. Monitor with increased mobility, HR ok Filed Vitals:   04/10/16 1447 04/11/16 0509  BP: 114/57 125/59  Pulse: 82 62  Temp: 98.5 F (36.9 C) 97.7 F (36.5 C)  Resp: 18 18  5. Neuropsych: This patient is capable of making decisions on his own behalf. 6. Skin/Wound Care: Routine skin checks 7. Fluids/Electrolytes/Nutrition: Routine I&O's with follow-up chemistries 8. Atrial fibrillation. Coumadin discontinued due to hemorrhagic transformation. . Cardiac rate controlled, plan to start Eliquis today, monitor for neuro decline 9. Diabetes mellitus of peripheral neuropathy. Hemoglobin A1c 8.1. Lantus insulin 18 units daily. Check blood sugars before meals and at bedtime. Diabetic teaching CBG (last 3)   Recent Labs  04/10/16 1151 04/10/16 1628 04/11/16 0653  GLUCAP 140* 137* 170*    10. Diastolic congestive heart failure. Weigh patient daily. Continue Lasix 40 mg daily. Monitor for any signs of fluid overload 11. CAD status post CABG. Aspirin and Coumadin discontinued due to hemorrhagic conversion. No chest pain or shortness of breath. 12. Chronic renal insufficiency. Creatinine 1.3-1.5.repeat 5/25 improved vs baseline  13. Hyperlipidemia. Lipitor, monitor generalized weakness 14.  Psycho social : Pt concerned about cost of Eliquis (~400.00 per mo) discussed assistance programs with SW 15. Chronic bilateral ulnar sensory neuropathies likely related to intra op compression during sternotomy LOS (Days) 7 A FACE TO FACE EVALUATION WAS PERFORMED  KIRSTEINS,ANDREW E 04/11/2016, 7:26 AM

## 2016-04-11 NOTE — Progress Notes (Signed)
Occupational Therapy Session Note  Patient Details  Name: [name] MRN: [id number] Date of Birth: 07-01-1941  Today's Date: 04/11/2016 OT Individual Time: 0800-0900 OT Individual Time Calculation (min): 60 min    Short Term Goals: Week 1:  OT Short Term Goal 1 (Week 1): Pt will complete LB dressing with min assist OT Short Term Goal 2 (Week 1): Pt will complete walk-in shower transfers with min assist OT Short Term Goal 3 (Week 1): Pt will complete toilet transfers with min assist OT Short Term Goal 4 (Week 1): Pt will complete 2 grooming tasks in standing for increased activity tolerance  Skilled Therapeutic Interventions/Progress Updates:  Completed self-care retraining. Pt performed all aspects of self-care at Mod I to include set-up, toileting, oral hygiene, and hair brushing. Pt has caregivers at home to assist with washing BLE feet and lower leg areas. Pt demonstrates good dynamic standing balance and safety awareness. Pt has good use of BUE during valued occupations to include self-care and leisure pursuits.   Therapy Documentation Precautions:  Precautions Precautions: Fall Precaution Comments: left knee weakness Restrictions Weight Bearing Restrictions: No General:   Vital Signs:  Pain: Pain Assessment Pain Assessment: 0-10 Pain Score: 4  Pain Location: Back Pain Orientation: Lower Pain Descriptors / Indicators: Stabbing Pain Intervention(s): Medication (See eMAR)  See Function Navigator for Current Functional Status.   Therapy/Group: Individual Therapy  Dierdre Searles 04/11/2016, 12:09 PM

## 2016-04-11 NOTE — Discharge Instructions (Addendum)
Inpatient Rehab Discharge Instructions  [name] Discharge date and time: No discharge date for patient encounter.   Activities/Precautions/ Functional Status: Activity: activity as tolerated Diet: diabetic diet Wound Care: none needed Functional status:  ___ No restrictions     ___ Walk up steps independently ___ 24/7 supervision/assistance   ___ Walk up steps with assistance ___ Intermittent supervision/assistance  ___ Bathe/dress independently ___ Walk with walker     _x__ Bathe/dress with assistance ___ Walk Independently    ___ Shower independently ___ Walk with assistance    ___ Shower with assistance ___ No alcohol     ___ Return to work/school ________  Special Instructions: Follow-up Dr. Lynford Citizen [address], [place], [place] phone number [phone]. Office to call for appointment  COMMUNITY REFERRALS UPON DISCHARGE:    Home Health:   PT, OT, RN    Agency:[facility]   Date of last service:04/12/2016  Medical Equipment/Items Ordered:HAS FROM PREVIOUS ADMITS  Other:RESUME [facility] CONSOLIDATED IN  ELIQUIS SUPPORT/ASSISTANCE SON TO FOLLOW UP WITH  GENERAL COMMUNITY RESOURCES FOR PATIENT/FAMILY: Support Groups:[facility] SUPPORT GROUP EVERY SECOND Thursday @ 3:00-4:00 PM ON THE REHAB UNIT QUESTIONS CONTACT KATIE [phone]  STROKE/TIA DISCHARGE INSTRUCTIONS SMOKING Cigarette smoking nearly doubles your risk of having a stroke & is the single most alterable risk factor  If you smoke or have smoked in the last 12 months, you are advised to quit smoking for your health.  Most of the excess cardiovascular risk related to smoking disappears within a year of stopping.  Ask you doctor about anti-smoking medications  [place] Quit Line: 1-800-QUIT NOW  Free Smoking Cessation Classes [phone]  CHOLESTEROL Know your levels; limit fat & cholesterol in your diet  Lipid Panel     Component Value  Date/Time   CHOL 102 03/30/2016 0740   TRIG 50 03/30/2016 0740   HDL 39* 03/30/2016 0740   CHOLHDL 2.6 03/30/2016 0740   VLDL 10 03/30/2016 0740   LDLCALC 53 03/30/2016 0740      Many patients benefit from treatment even if their cholesterol is at goal.  Goal: Total Cholesterol (CHOL) less than 160  Goal:  Triglycerides (TRIG) less than 150  Goal:  HDL greater than 40  Goal:  LDL (LDLCALC) less than 100   BLOOD PRESSURE American Stroke Association blood pressure target is less that 120/80 mm/Hg  Your discharge blood pressure is:  BP: (!) 125/59 mmHg  Monitor your blood pressure  Limit your salt and alcohol intake  Many individuals will require more than one medication for high blood pressure  DIABETES (A1c is a blood sugar average for last 3 months) Goal HGBA1c is under 7% (HBGA1c is blood sugar average for last 3 months)  Diabetes:   Lab Results  Component Value Date   HGBA1C 8.1* 03/30/2016     Your HGBA1c can be lowered with medications, healthy diet, and exercise.  Check your blood sugar as directed by your physician  Call your physician if you experience unexplained or low blood sugars.  PHYSICAL ACTIVITY/REHABILITATION Goal is 30 minutes at least 4 days per week  Activity: Increase activity slowly, Therapies: Physical Therapy: Home Health Return to work:   Activity decreases your risk of heart attack and stroke and makes your heart stronger.  It helps control your weight and blood pressure; helps you relax and can improve your mood.  Participate in a regular exercise program.  Talk with your doctor about the best form of  exercise for you (dancing, walking, swimming, cycling).  DIET/WEIGHT Goal is to maintain a healthy weight  Your discharge diet is: Diet heart healthy/carb modified Room service appropriate?: Yes; Fluid consistency:: Thin  liquids Your height is:  Height: 5\' 8"  (172.7 cm) Your current weight is: Weight: (!) 139.8 kg (308 lb 3.3 oz) Your Body  Mass Index (BMI) is:  BMI (Calculated): 48.6  Following the type of diet specifically designed for you will help prevent another stroke.  Your goal weight range is:    Your goal Body Mass Index (BMI) is 19-24.  Healthy food habits can help reduce 3 risk factors for stroke:  High cholesterol, hypertension, and excess weight.  RESOURCES Stroke/Support Group:  Call [phone]   STROKE EDUCATION PROVIDED/REVIEWED AND GIVEN TO PATIENT Stroke warning signs and symptoms How to activate emergency medical system (call 911). Medications prescribed at discharge. Need for follow-up after discharge. Personal risk factors for stroke. Pneumonia vaccine given:  Flu vaccine given:  My questions have been answered, the writing is legible, and I understand these instructions.  I will adhere to these goals & educational materials that have been provided to me after my discharge from the hospital.       My questions have been answered and I understand these instructions. I will adhere to these goals and the provided educational materials after my discharge from the hospital.  Patient/Caregiver Signature _______________________________ Date __________  Clinician Signature _______________________________________ Date __________  Please bring this form and your medication list with you to all your follow-up doctor's appointments.   Information on my medicine - ELIQUIS (apixaban)  This medication education was reviewed with me or my healthcare representative as part of my discharge preparation.    Why was Eliquis prescribed for you? Eliquis was prescribed for you to reduce the risk of a blood clot forming that can cause a stroke if you have a medical condition called atrial fibrillation (a type of irregular heartbeat).  What do You need to know about Eliquis ? Take your Eliquis TWICE DAILY - one tablet in the morning and one tablet in the evening with or without food. If you have difficulty  swallowing the tablet whole please discuss with your pharmacist how to take the medication safely.  Take Eliquis exactly as prescribed by your doctor and DO NOT stop taking Eliquis without talking to the doctor who prescribed the medication.  Stopping may increase your risk of developing a stroke.  Refill your prescription before you run out.  After discharge, you should have regular check-up appointments with your healthcare provider that is prescribing your Eliquis.  In the future your dose may need to be changed if your kidney function or weight changes by a significant amount or as you get older.  What do you do if you miss a dose? If you miss a dose, take it as soon as you remember on the same day and resume taking twice daily.  Do not take more than one dose of ELIQUIS at the same time to make up a missed dose.  Important Safety Information A possible side effect of Eliquis is bleeding. You should call your healthcare provider right away if you experience any of the following: ? Bleeding from an injury or your nose that does not stop. ? Unusual colored urine (red or dark brown) or unusual colored stools (red or black). ? Unusual bruising for unknown reasons. ? A serious fall or if you hit your head (even if there is no bleeding).  Some medicines may interact with Eliquis and might increase your risk of bleeding or clotting while on Eliquis. To help avoid this, consult your healthcare provider or pharmacist prior to using any new prescription or non-prescription medications, including herbals, vitamins, non-steroidal anti-inflammatory drugs (NSAIDs) and supplements.  This website has more information on Eliquis (apixaban): http://www.eliquis.com/eliquis/home

## 2016-04-11 NOTE — Discharge Summary (Signed)
NAMEMAAT, BAUGUESS NO.:  [id number]  MEDICAL RECORD NO.:  [id number]  LOCATION:  [id number]                        FACILITY:  [facility]  PHYSICIAN:  Charlett Blake, M.D.DATE OF BIRTH:  03-01-1941  DATE OF ADMISSION:  04/04/2016 DATE OF DISCHARGE:  04/12/2016                              DISCHARGE SUMMARY   DISCHARGE DIAGNOSES: 1. Left hemiparesis and facial droop secondary to right MCA infarct     with hemorrhagic transformation, felt to be embolic secondary to     atrial fibrillation. 2. SCDs for DVT prophylaxis. 3. Pain management. 4. Hypertension. 5. Atrial fibrillation. 6. Diabetes mellitus, peripheral neuropathy. 7. Diastolic congestive heart failure. 8. Coronary artery disease status post CABG. 9. Chronic renal insufficiency, creatinine 1.3-1.5. 10.Hyperlipidemia.  HISTORY OF PRESENT ILLNESS:  This is a 75 year old right-handed female with history of CAD with CABG, ischemic cardiomyopathy, diastolic congestive heart failure, atrial fibrillation on chronic Coumadin.  Per chart, the patient lives with son, ambulating short distances prior to admission using a walker.  One level home.  Presented on Mar 30, 2016, with sudden onset of left-sided weakness, facial droop, and slurred speech.  Cranial CT scan showed focal hypodensity within the right basal ganglia, highly suspicious for evolving ischemia.  Possible extension into the right subinsular region.  Remote pontine infarct.  The patient did not receive tPA.  INR on admission of 1.59.  CT angiogram of head and neck showed right proximal M2 occlusion with distal reconstitution. There was completed infarct in the right caudate, common putamen, internal capsule, and posterior insula.  Echocardiogram with ejection fraction of 40% diffuse hypokinesis.  MRI, CT Mar 31, 2016, of the brain showed right MCA with hemorrhagic transformation felt to be embolic secondary to atrial fibrillation.  Plan was to begin  Eliquis after repeat CT of the head, 7 days post stroke.  Urine study positive for nitrite, greater than 100,000 E coli, maintained on intravenous Rocephin.  The patient was admitted for comprehensive rehab program.  PAST MEDICAL HISTORY:  See discharge diagnoses.  SOCIAL HISTORY:  Lives with son, independent with a walker prior to admission, had a home health aide 2 times a week.  Functional status upon admission to rehab services was minimal assist, 12 feet rolling walker; mod to max assist, sit to stand; min to max assist for activities of daily living.  PHYSICAL EXAMINATION:  VITAL SIGNS:  Blood pressure 110/40, pulse 64, temperature 98, and respirations 18. GENERAL:  This was an alert female, in no acute distress.  Oriented to person, place, and time. LUNGS:  Clear to auscultation without wheeze. CARDIAC:  Irregularly irregular. ABDOMEN:  Soft, nontender.  Good bowel sounds.  [place] COURSE:  The patient was admitted to inpatient rehab services with therapies initiated on a 3-hour daily basis, consisting of physical therapy, occupational therapy, and rehabilitation nursing.  The following issues were addressed during the patient's rehabilitation stay.  Pertaining to Ms. Prestia's right MCA infarct with hemorrhagic transformation, felt to be embolic secondary to atrial fibrillation.  Followed closely by Neurology Services.  Followup cranial CT scan post day stroke #7 showed stable appearance of right basal ganglia hemorrhagic infarct, slight improvement of mass  effect on the frontal horn of the right lateral ventricle, no evidence of new hemorrhagic or infarction.  She was recommended to begin Eliquis 5 mg b.i.d. on Apr 11, 2016.  The patient remained neurologically stable. SCDs for DVT prophylaxis.  Blood pressures controlled on Coreg, Lasix with no orthostatic changes.  Pain management for chronic back pain. The patient remained on Neurontin 400 mg at  bedtime as well as tramadol. Atrial fibrillation, cardiac rate controlled, Eliquis had been initiated on Apr 11, 2016.  The patient had been on Coumadin prior to hospital admission, discontinued due to hemorrhagic transformation.  Diabetes mellitus, peripheral neuropathy, hemoglobin A1c of 8.1, remained on Lantus therapy, full diabetic teaching completed.  The patient exhibited no signs of fluid overload, remained on Lasix 40 mg daily.  Chronic renal insufficiency, baseline 1.3-1.5, remained stable.  The patient received weekly collaborative interdisciplinary team conferences to discuss estimated length of stay, family teaching, and any barriers to discharge.  Ambulating to her room from the therapy gym 200 feet without a rest break using a walker.  Strength and endurance continued to greatly improved.  Ambulating to the bathroom with supervision. Activities of daily living and homemaking.  Could gather belongings for bathing, dressing, grooming, and hygiene.  Dressing completed with simple setup.  Full family teaching was completed.  Plan discharged to home with ongoing therapies dictated per [facility].  DISCHARGE MEDICATIONS: 1. Eliquis 5 mg p.o. b.i.d. 2. Lipitor 40 mg p.o. daily. 3. Coreg 6.25 mg p.o. b.i.d. 4. Colace 100 mg p.o. b.i.d. 5. Lasix 40 mg p.o. daily. 6. Neurontin 400 mg p.o. at bedtime. 7. Lantus insulin 18 units at bedtime. 8. Ultram 100 mg p.o. daily and 50 mg at bedtime for chronic back     pain. 9. Potassium chloride 20 mEq daily   DIET:  Diabetic diet.  FOLLOWUP:  The patient would follow up with Dr. Alysia Penna at the outpatient rehab service office to be contacted; Dr. Erlinda Hong, Neurology Services, 1 month call for appointment; Dr. Daphene Jaeger, Medical Management.     Lauraine Rinne, P.A.   ______________________________ Charlett Blake, M.D.    DA/MEDQ  D:  04/11/2016  T:  04/11/2016  Job:  [id number]  cc:   Daphene Jaeger,  Dr. Rosalin Hawking, MD

## 2016-04-11 NOTE — Progress Notes (Signed)
ANTICOAGULATION CONSULT NOTE - Initial Consult  Pharmacy Consult for eliquis Indication: atrial fibrillation  Allergies  Allergen Reactions  . Benazepril Other (See Comments)  . Fe-Succ-C-Thre-B12-Des Stomach Other (See Comments)    Pt doesn't remember   . Spironolactone Other (See Comments)    Dizziness   . Sulfamethoxazole-Trimethoprim Other (See Comments)    Pt doesn't remember    Patient Measurements: Height: 5\' 8"  (172.7 cm) Weight: (!) 308 lb 3.3 oz (139.8 kg) IBW/kg (Calculated) : 63.9   Vital Signs: Temp: 97.7 F (36.5 C) (05/31 0509) Temp Source: Oral (05/31 0509) BP: 125/59 mmHg (05/31 0509) Pulse Rate: 62 (05/31 0509)  Labs: No results for input(s): HGB, HCT, PLT, APTT, LABPROT, INR, HEPARINUNFRC, HEPRLOWMOCWT, CREATININE, CKTOTAL, CKMB, TROPONINI in the last 72 hours.  Estimated Creatinine Clearance: 59.4 mL/min (by C-G formula based on Cr of 1.18).   Medical History: Past Medical History  Diagnosis Date  . CAD (coronary artery disease) of artery bypass graft 05/2008    S/P CABG in 2009  . Ischemic cardiomyopathy   . Systolic heart failure   . Atrial fibrillation ([place])     Postoperative in 2009;  S/P transesophageal echocardiography-guided cardioversion, previously on Amiodarone and Coumadin; recurrent in April 2013  . Hyperlipidemia   . Chronic anticoagulation   . Obesities, morbid ([place])   . Hypertension   . Heart murmur   . CHF (congestive heart failure) ([place])   . Type II diabetes mellitus ([place])   . TIA (transient ischemic attack) 03/30/2016  . Arthritis     "qwhere" (03/30/2016)  . Chronic lower back pain   . Chronic kidney disease (CKD), stage III (moderate)     Medications:  Prescriptions prior to admission  Medication Sig Dispense Refill Last Dose  . atorvastatin (LIPITOR) 40 MG tablet Take 1 tablet (40 mg total) by mouth daily at 6 PM. 30 tablet 0   . carvedilol (COREG) 6.25 MG tablet Take 1 tablet (6.25 mg total) by mouth 2 (two) times  daily with a meal. 60 tablet 6 03/29/2016 at 1800  . cephALEXin (KEFLEX) 500 MG capsule Take 1 capsule (500 mg total) by mouth every 12 (twelve) hours. 8 capsule 0   . cholecalciferol (VITAMIN D) 1000 UNITS tablet Take 1,000 Units by mouth 2 (two) times daily.   03/29/2016 at Unknown time  . docusate sodium (COLACE) 100 MG capsule Take 100 mg by mouth 2 (two) times daily.    03/29/2016 at Unknown time  . furosemide (LASIX) 40 MG tablet Take 1 tablet (40 mg total) by mouth daily. 30 tablet 0   . gabapentin (NEURONTIN) 400 MG capsule Take 400 mg by mouth at bedtime.   03/29/2016 at Unknown time  . insulin glargine (LANTUS) 100 UNIT/ML injection Inject 32 Units into the skin at bedtime.    03/29/2016 at Unknown time  . nitroGLYCERIN (NITROSTAT) 0.4 MG SL tablet Place 0.4 mg under the tongue every 5 (five) minutes as needed. May repeat for up to 3 doses.    unknown  . potassium chloride SA (K-DUR,KLOR-CON) 20 MEQ tablet Take 20 mEq by mouth daily.     03/29/2016 at Unknown time  . rOPINIRole (REQUIP) 0.5 MG tablet Take 1 mg by mouth at bedtime.   03/29/2016 at Unknown time  . traMADol (ULTRAM) 50 MG tablet Take 50-100 mg by mouth 2 (two) times daily. Take 2 tablets every morning and take 1 tablet at night   03/29/2016 at Unknown time  . vitamin B-12 (CYANOCOBALAMIN) 1000 MCG  tablet Take 1,000 mcg by mouth daily.   03/29/2016 at Unknown time    Assessment: 75 yo F to start Eliquis for stroke prevention in the setting of Afib.  S/p R MCA infarct with hemorrhagic transformation felt to be embolic secondary to afib.  Coumadin discontinued due to hemorrhagic transformation.  To start eliquis today. Rec by neuro to start Eliquis after a repeat CT head at day 7 post stroke.  Head CT 5/26: stable, no new hemorrhage or infarct. Wt 138.8 kg, age 3, creat 1.18. Hg 11.2, PLTC 151.   Plan:  Eliquis 5 mg po bid  Eudelia Bunch, Pharm.D. BP:[phone] 04/11/2016 7:40 AM

## 2016-04-12 ENCOUNTER — Inpatient Hospital Stay (HOSPITAL_COMMUNITY): Payer: Medicare Other | Admitting: Occupational Therapy

## 2016-04-12 LAB — GLUCOSE, CAPILLARY: Glucose-Capillary: 93 mg/dL (ref 65–99)

## 2016-04-12 MED ORDER — ATORVASTATIN CALCIUM 40 MG PO TABS: 40.0000 mg | ORAL_TABLET | Freq: Every day | ORAL | Status: DC

## 2016-04-12 MED ORDER — FUROSEMIDE 40 MG PO TABS: 40.0000 mg | ORAL_TABLET | Freq: Every day | ORAL | Status: DC

## 2016-04-12 MED ORDER — GABAPENTIN 400 MG PO CAPS: 400.0000 mg | ORAL_CAPSULE | Freq: Every day | ORAL | Status: DC

## 2016-04-12 MED ORDER — APIXABAN 5 MG PO TABS: 5.0000 mg | ORAL_TABLET | Freq: Two times a day (BID) | ORAL | Status: DC

## 2016-04-12 MED ORDER — CARVEDILOL 6.25 MG PO TABS: 6.2500 mg | ORAL_TABLET | Freq: Two times a day (BID) | ORAL | Status: DC

## 2016-04-12 MED ORDER — INSULIN GLARGINE 100 UNIT/ML ~~LOC~~ SOLN: 18.0000 [IU] | Freq: Every day | SUBCUTANEOUS | Status: DC

## 2016-04-12 MED ORDER — POTASSIUM CHLORIDE CRYS ER 20 MEQ PO TBCR: 20.0000 meq | EXTENDED_RELEASE_TABLET | Freq: Every day | ORAL | Status: DC

## 2016-04-12 NOTE — Progress Notes (Signed)
Social Work  Discharge Note  The overall goal for the admission was met for:   Discharge location: Yes-HOME WITH SON WHO IS THERE IN THE EVENINGS, BUT WILL BE THERE FOR [place]  Length of Stay: Yes-8 DAYS  Discharge activity level: Yes-MOD/I LEVEL  Home/community participation: Yes  Services provided included: MD, RD, PT, OT, RN, CM, TR, Pharmacy and SW  Financial Services: Private Insurance: [place]  Follow-up services arranged: Home Health: [facility]-PT,OT,RN and Patient/Family has no preference for HH/DME agencies  Comments (or additional information):SON CAN PROVIDE SUPERVISION IF NEEDED. GAVE INFORMATION ON ELIQUIS ASSISTANCE PROGRAM AND 30 DAY FREE CARD.  [facility]. HAS ALL EQUIPMENT FROM PREVIOUS ADMITS. READY TO GO HOME  Patient/Family verbalized understanding of follow-up arrangements: Yes  Individual responsible for coordination of the follow-up plan: ROBERT-SON & PATIENT  Confirmed correct DME delivered: Elease Hashimoto 04/12/2016    Elease Hashimoto

## 2016-04-12 NOTE — Progress Notes (Addendum)
Patient discharged home.  Left floor via wheelchair, escorted by nursing staff and family.  Patient and son verbalized understanding of discharge instructions as given by Marlowe Shores, PA.  All patient belongings sent with patient, including DME.  Appears to be in no immediate distress at this time.  [name], RN

## 2016-04-12 NOTE — Progress Notes (Signed)
Physical Therapy Discharge Summary  Patient Details  Name: [name] MRN: [id number] Date of Birth: [birth year]  Today's Date: 04/12/2016   Patient has met 10 of 10 long term goals due to improved activity tolerance, improved balance, improved postural control, increased strength, decreased pain, ability to compensate for deficits and functional use of  left upper extremity and left lower extremity.  Patient to discharge at an ambulatory level Modified Independent.   Patient's care partner is independent to provide the necessary physical and cognitive assistance at discharge; pt will be alone intermittently but lives with son who can be with her in the mornings and evenings.   Reasons goals not met: All goals met  Recommendation:  Patient will benefit from ongoing skilled PT services in home health setting to continue to advance safe functional mobility, address ongoing impairments in strength, balance, activity tolerance, and minimize fall risk.  Equipment: No equipment provided  Reasons for discharge: treatment goals met and discharge from hospital  Patient/family agrees with progress made and goals achieved: Yes  PT Discharge Precautions/Restrictions Precautions Precautions: Fall Restrictions Weight Bearing Restrictions: No Pain Pain Assessment Pain Assessment: No/denies pain Pain Score: 0-No pain Vision/Perception    WFL Cognition Overall Cognitive Status: Within Functional Limits for tasks assessed Arousal/Alertness: Awake/alert Orientation Level: Oriented X4 Attention: Selective Selective Attention: Appears intact Memory: Appears intact Awareness: Appears intact Problem Solving: Appears intact Safety/Judgment: Appears intact Sensation Sensation Light Touch: Appears Intact Stereognosis: Not tested Hot/Cold: Not tested Proprioception: Appears Intact Coordination Gross Motor Movements are Fluid and Coordinated: Yes Fine Motor Movements are Fluid and  Coordinated: No Finger Nose Finger Test: mildly slower Lt > Rt Heel Shin Test: slow and decreased excursion Motor  Motor Motor - Discharge Observations: generalized weakness, improved over admission  Mobility Bed Mobility Bed Mobility: Supine to Sit;Sit to Supine Supine to Sit: 6: Modified independent (Device/Increase time) Sit to Supine: 6: Modified independent (Device/Increase time) Transfers Transfers: Yes Stand Pivot Transfers: 6: Modified independent (Device/Increase time) Locomotion  Ambulation Ambulation: Yes Ambulation/Gait Assistance: 6: Modified independent (Device/Increase time) Ambulation Distance (Feet): 200 Feet Assistive device: Rollator Gait Gait: Yes Gait Pattern: Impaired Gait Pattern: Poor foot clearance - left;Antalgic;Lateral trunk lean to right;Decreased stride length Gait velocity: decreased for age/gender norms Stairs / Additional Locomotion Stairs: Yes Stairs Assistance: 5: Supervision Stairs Assistance Details: Verbal cues for precautions/safety;Verbal cues for sequencing;Verbal cues for technique Stair Management Technique: Two rails;Step to pattern;Forwards Number of Stairs: 12 Height of Stairs: 6 Ramp: 5: Supervision Curb: 5: Supervision Wheelchair Mobility Wheelchair Mobility: No  Trunk/Postural Assessment  Cervical Assessment Cervical Assessment: Exceptions to [facility] (forward head posture) Thoracic Assessment Thoracic Assessment: Exceptions to [facility] (increased kyphosis) Lumbar Assessment Lumbar Assessment: Exceptions to [facility] (decreased lumbar lordosis) Postural Control Postural Control: Within Functional Limits  Balance Balance Balance Assessed: Yes Standardized Balance Assessment Standardized Balance Assessment: Berg Balance Test Berg Balance Test Sit to Stand: Able to stand  independently using hands Standing Unsupported: Able to stand safely 2 minutes Sitting with Back Unsupported but Feet Supported on Floor or Stool: Able to sit safely  and securely 2 minutes Stand to Sit: Controls descent by using hands Transfers: Able to transfer safely, definite need of hands Standing Unsupported with Eyes Closed: Able to stand 10 seconds with supervision Standing Ubsupported with Feet Together: Able to place feet together independently and stand for 1 minute with supervision From Standing, Reach Forward with Outstretched Arm: Can reach forward >12 cm safely (5") From Standing Position, Pick up Object from Floor:  Able to pick up shoe safely and easily From Standing Position, Turn to Look Behind Over each Shoulder: Looks behind one side only/other side shows less weight shift Turn 360 Degrees: Needs close supervision or verbal cueing Standing Unsupported, Alternately Place Feet on Step/Stool: Able to complete >2 steps/needs minimal assist Standing Unsupported, One Foot in Front: Able to take small step independently and hold 30 seconds Standing on One Leg: Unable to try or needs assist to prevent fall Total Score: 37 Static Sitting Balance Static Sitting - Balance Support: No upper extremity supported;Feet supported Static Sitting - Level of Assistance: 7: Independent Dynamic Sitting Balance Dynamic Sitting - Balance Support: No upper extremity supported;Feet supported Dynamic Sitting - Level of Assistance: 6: Modified independent (Device/Increase time) Dynamic Sitting - Balance Activities: Lateral lean/weight shifting;Forward lean/weight shifting;Reaching across midline Static Standing Balance Static Standing - Balance Support: Bilateral upper extremity supported Static Standing - Level of Assistance: 6: Modified independent (Device/Increase time) Dynamic Standing Balance Dynamic Standing - Balance Support: Bilateral upper extremity supported;During functional activity Dynamic Standing - Level of Assistance: 6: Modified independent (Device/Increase time) Dynamic Standing - Balance Activities: Lateral lean/weight shifting;Forward  lean/weight shifting;Reaching across midline Extremity Assessment  RUE Assessment RUE Assessment: Within Functional Limits (ROM WFL, strength grossly 4+/5) LUE Assessment LUE Assessment: Within Functional Limits (ROM WFL, strength grossly 4/5 overall) RLE Assessment RLE Assessment: Within Functional Limits LLE Assessment LLE Assessment: Within Functional Limits   See Function Navigator for Current Functional Status.  Benjiman Core Tygielski 04/12/2016, 10:15 AM

## 2016-04-12 NOTE — Progress Notes (Signed)
Subjective/Complaints: No issues overnite.  Has tingling in 4th and 5th digits bilaterally since CABG in 2009 ROS- neg CP/SOB/ , N/V/D  Objective: Vital Signs: Blood pressure 110/61, pulse 60, temperature 98.2 F (36.8 C), temperature source Oral, resp. rate 18, height 5\' 8"  (1.727 m), weight 139.8 kg (308 lb 3.3 oz), SpO2 98 %. No results found. Results for orders placed or performed during the hospital encounter of 04/04/16 (from the past 72 hour(s))  Glucose, capillary     Status: Abnormal   Collection Time: 04/09/16 11:45 AM  Result Value Ref Range   Glucose-Capillary 122 (H) 65 - 99 mg/dL  Glucose, capillary     Status: Abnormal   Collection Time: 04/09/16  4:28 PM  Result Value Ref Range   Glucose-Capillary 128 (H) 65 - 99 mg/dL   Comment 1 Notify RN   Glucose, capillary     Status: Abnormal   Collection Time: 04/09/16  9:45 PM  Result Value Ref Range   Glucose-Capillary 151 (H) 65 - 99 mg/dL  Glucose, capillary     Status: Abnormal   Collection Time: 04/10/16  6:49 AM  Result Value Ref Range   Glucose-Capillary 117 (H) 65 - 99 mg/dL  Glucose, capillary     Status: Abnormal   Collection Time: 04/10/16 11:51 AM  Result Value Ref Range   Glucose-Capillary 140 (H) 65 - 99 mg/dL  Glucose, capillary     Status: Abnormal   Collection Time: 04/10/16  4:28 PM  Result Value Ref Range   Glucose-Capillary 137 (H) 65 - 99 mg/dL  Glucose, capillary     Status: Abnormal   Collection Time: 04/11/16  6:53 AM  Result Value Ref Range   Glucose-Capillary 113 (H) 65 - 99 mg/dL  Glucose, capillary     Status: Abnormal   Collection Time: 04/11/16 11:34 AM  Result Value Ref Range   Glucose-Capillary 113 (H) 65 - 99 mg/dL  Glucose, capillary     Status: Abnormal   Collection Time: 04/11/16  4:53 PM  Result Value Ref Range   Glucose-Capillary 120 (H) 65 - 99 mg/dL  Glucose, capillary     Status: Abnormal   Collection Time: 04/11/16  8:43 PM  Result Value Ref Range    Glucose-Capillary 163 (H) 65 - 99 mg/dL  Glucose, capillary     Status: None   Collection Time: 04/12/16  6:29 AM  Result Value Ref Range   Glucose-Capillary 93 65 - 99 mg/dL     HEENT: normal Cardio: RRR and no murmur Resp: RRR and no murmur GI: BS positive and NT, ND Extremity:  Pulses positive and No Edema Skin:   Intact Neuro: Alert/Oriented, Normal Sensory, Abnormal Motor 4- bilateral HF, 4.5 B Knee ext and ankles, 5/5 in BUE and Abnormal FMC Ataxic/ dec FMC, has parasthesia in B 4th and 5th digits Musc/Skel:  Normal Gen NAD   Assessment/Plan: 1. Functional deficits secondary to Right MCA infarct Stable for D/C today F/u PCP in 3-4 weeks F/u PM&R 2 weeks See D/C summary See D/C instructions FIM: Function - Bathing Position: Shower Body parts bathed by patient: Right arm, Left arm, Chest, Front perineal area, Abdomen, Buttocks, Right upper leg, Left upper leg Body parts bathed by helper: Right lower leg, Left lower leg, Back (Pt has helper perform same bathing assist at home. ) Bathing not applicable:  (declined washing buttocks as already donned underwear) Assist Level: Set up, More than reasonable time (Min assist) Set up : To obtain items  Function- Upper Body Dressing/Undressing What is the patient wearing?: Bra, Pull over shirt/dress Bra - Perfomed by patient: Thread/unthread right bra strap, Thread/unthread left bra strap, Hook/unhook bra (pull down sports bra) Bra - Perfomed by helper: Hook/unhook bra (pull down sports bra) Pull over shirt/dress - Perfomed by patient: Thread/unthread right sleeve, Thread/unthread left sleeve, Put head through opening, Pull shirt over trunk Assist Level: No help, No cues Set up : To obtain clothing/put away Function - Lower Body Dressing/Undressing What is the patient wearing?: Pants, Underwear, Non-skid slipper socks Position: Other (comment) (Dressing perfomed at toilet. ) Underwear - Performed by patient: Thread/unthread right  underwear leg, Thread/unthread left underwear leg, Pull underwear up/down Underwear - Performed by helper: Thread/unthread right underwear leg, Thread/unthread left underwear leg Pants- Performed by patient: Thread/unthread right pants leg, Thread/unthread left pants leg, Pull pants up/down Pants- Performed by helper: Thread/unthread right pants leg, Thread/unthread left pants leg Non-skid slipper socks- Performed by helper: Don/doff right sock, Don/doff left sock (Pt has helper for donning socks at home. ) Shoes - Performed by patient: Don/doff right shoe, Don/doff left shoe Assist for footwear: Partial/moderate assist Assist for lower body dressing: Assistive device, More than reasonable time Assistive Device Comment: [place] Set up : To obtain clothing/put away  Function - Toileting Toileting steps completed by patient: Adjust clothing prior to toileting, Adjust clothing after toileting Toileting steps completed by helper: Performs perineal hygiene Toileting Assistive Devices: Grab bar or rail Assist level: More than reasonable time  Function - Air cabin crew transfer assistive device: Elevated toilet seat/BSC over toilet, Grab bar, Walker Assist level to toilet: No Help, no cues, assistive device, takes more than a reasonable amount of time Assist level from toilet: No Help, no cues, assistive device, takes more than a reasonable amount of time Assist level to bedside commode (at bedside): 2 helpers (per Gilman Buttner, NT 2 helpers needed for bsc) Assist level from bedside commode (at bedside): Moderate assist (Pt 50 - 74%/lift or lower)  Function - Chair/bed transfer Chair/bed transfer method: Stand pivot Chair/bed transfer assist level: No Help, no cues, assistive device, takes more than a reasonable amount of time Chair/bed transfer assistive device: Walker Chair/bed transfer details: Verbal cues for precautions/safety, Verbal cues for sequencing, Verbal cues for  technique, Tactile cues for posture  Function - Locomotion: Wheelchair Will patient use wheelchair at discharge?: No Wheelchair activity did not occur: N/A Wheel 50 feet with 2 turns activity did not occur: N/A Wheel 150 feet activity did not occur: N/A Function - Locomotion: Ambulation Assistive device: Walker-rolling (rollator) Max distance: 17ft Assist level: No help, No cues, assistive device, takes more than a reasonable amount of time Assist level: No help, No cues, assistive device, takes more than a reasonable amount of time Walk 50 feet with 2 turns activity did not occur: Safety/medical concerns Assist level: Supervision or verbal cues Assist level: No help, No cues, assistive device, takes more than a reasonable amount of time Walk 10 feet on uneven surfaces activity did not occur: Safety/medical concerns Assist level: Supervision or verbal cues  Function - Comprehension Comprehension: Auditory Comprehension assist level: Follows complex conversation/direction with extra time/assistive device  Function - Expression Expression: Verbal Expression assist level: Expresses complex ideas: With extra time/assistive device  Function - Social Interaction Social Interaction assist level: Interacts appropriately with others with medication or extra time (anti-anxiety, antidepressant).  Function - Problem Solving Problem solving assist level: Solves complex problems: With extra time  Function - Memory Memory assist level:  More than reasonable amount of time Patient normally able to recall (first 3 days only): Current season, Location of own room, Staff names and faces, That he or she is in a hospital   Medical Problem List and Plan: 1.  Left hemiparesis, facial droop  secondary to right MCA infarct with hemorrhagic transformation felt to be embolic secondary to atrial fibrillation. Stable for D/C.2.  DVT Prophylaxis/Anticoagulation: SCDs. Monitor for any signs of DVT 3. Pain  Management/chronic back pain: Neurontin 400 mg daily at bedtime, Tramadol 100 mg daily 50 mg at bedtime as prior to admission 4. Hypertension. Coreg 6.25 mg twice a day, Lasix 40 mg daily. Monitor with increased mobility, HR ok Filed Vitals:   04/11/16 1434 04/12/16 0440  BP: 151/45 110/61  Pulse: 77 60  Temp: 98.3 F (36.8 C) 98.2 F (36.8 C)  Resp: 18 18  5. Neuropsych: This patient is capable of making decisions on his own behalf. 6. Skin/Wound Care: Routine skin checks 7. Fluids/Electrolytes/Nutrition: Routine I&O's with follow-up chemistries 8. Atrial fibrillation. Coumadin discontinued due to hemorrhagic transformation. . Cardiac rate controlled, plan to start Eliquis today, no evidence of neuro decline 9. Diabetes mellitus of peripheral neuropathy. Hemoglobin A1c 8.1. Lantus insulin 18 units daily. Check blood sugars before meals and at bedtime. Diabetic teaching CBG (last 3)   Recent Labs  04/11/16 1653 04/11/16 2043 04/12/16 0629  GLUCAP 120* 163* 93    10. Diastolic congestive heart failure. Weigh patient daily. Continue Lasix 40 mg daily. Monitor for any signs of fluid overload 11. CAD status post CABG. Aspirin and Coumadin discontinued due to hemorrhagic conversion. No chest pain or shortness of breath. 12. Chronic renal insufficiency. Creatinine 1.3-1.5.repeat 5/25 improved vs baseline  13. Hyperlipidemia. Lipitor, monitor generalized weakness 14.  Psycho social : Pt concerned about cost of Eliquis (~400.00 per mo) discussed assistance programs with SW 15. Chronic bilateral ulnar sensory neuropathies likely related to intra op compression during sternotomy LOS (Days) 8 A FACE TO FACE EVALUATION WAS PERFORMED  KIRSTEINS,ANDREW E 04/12/2016, 7:06 AM

## 2016-04-16 ENCOUNTER — Telehealth: Payer: Self-pay

## 2016-04-16 NOTE — Telephone Encounter (Signed)
1. Are you/is patient experiencing any problems since coming home? Are there any questions regarding any aspect of care? 2. Are there any questions regarding medications administration/dosing? Are meds being taken as prescribed? Patient should review meds with caller to confirm 3. Have there been any falls? 4. Has Home Health been to the house and/or have they contacted you? If not, have you tried to contact them? Can we help you contact them? 5. Are bowels and bladder emptying properly? Are there any unexpected incontinence issues? If applicable, is patient following bowel/bladder programs? 6. Any fevers, problems with breathing, unexpected pain? 7. Are there any skin problems or new areas of breakdown? 8. Has the patient/family member arranged specialty MD follow up (ie cardiology/neurology/renal/surgical/etc)?  Can we help arrange? 9. Does the patient need any other services or support that we can help arrange? 10. Are caregivers following through as expected in assisting the patient? 11. Has the patient quit smoking, drinking alcohol, or using drugs as recommended?  

## 2016-04-17 NOTE — Telephone Encounter (Signed)
Patient called back 04/16/2016 12:30 PM and left message for Amber to call her back

## 2016-04-19 NOTE — Telephone Encounter (Signed)
Made pt aware of appointment.

## 2016-04-26 ENCOUNTER — Encounter: Payer: Self-pay | Admitting: Physical Medicine & Rehabilitation

## 2016-04-26 ENCOUNTER — Encounter: Payer: Medicare Other | Attending: Physical Medicine & Rehabilitation

## 2016-04-26 ENCOUNTER — Ambulatory Visit (HOSPITAL_BASED_OUTPATIENT_CLINIC_OR_DEPARTMENT_OTHER): Payer: Medicare Other | Admitting: Physical Medicine & Rehabilitation

## 2016-04-26 ENCOUNTER — Telehealth: Payer: Self-pay

## 2016-04-26 VITALS — BP 134/83 | HR 84 | Resp 14

## 2016-04-26 DIAGNOSIS — I63411 Cerebral infarction due to embolism of right middle cerebral artery: Secondary | ICD-10-CM | POA: Diagnosis not present

## 2016-04-26 DIAGNOSIS — E1122 Type 2 diabetes mellitus with diabetic chronic kidney disease: Secondary | ICD-10-CM | POA: Insufficient documentation

## 2016-04-26 DIAGNOSIS — I4819 Other persistent atrial fibrillation: Secondary | ICD-10-CM

## 2016-04-26 DIAGNOSIS — I130 Hypertensive heart and chronic kidney disease with heart failure and stage 1 through stage 4 chronic kidney disease, or unspecified chronic kidney disease: Secondary | ICD-10-CM | POA: Diagnosis not present

## 2016-04-26 DIAGNOSIS — I69398 Other sequelae of cerebral infarction: Secondary | ICD-10-CM

## 2016-04-26 DIAGNOSIS — M545 Low back pain, unspecified: Secondary | ICD-10-CM

## 2016-04-26 DIAGNOSIS — R269 Unspecified abnormalities of gait and mobility: Secondary | ICD-10-CM | POA: Diagnosis not present

## 2016-04-26 DIAGNOSIS — I69392 Facial weakness following cerebral infarction: Secondary | ICD-10-CM | POA: Insufficient documentation

## 2016-04-26 DIAGNOSIS — G8929 Other chronic pain: Secondary | ICD-10-CM | POA: Insufficient documentation

## 2016-04-26 DIAGNOSIS — N183 Chronic kidney disease, stage 3 (moderate): Secondary | ICD-10-CM | POA: Diagnosis not present

## 2016-04-26 DIAGNOSIS — I69354 Hemiplegia and hemiparesis following cerebral infarction affecting left non-dominant side: Secondary | ICD-10-CM | POA: Diagnosis not present

## 2016-04-26 DIAGNOSIS — I509 Heart failure, unspecified: Secondary | ICD-10-CM | POA: Diagnosis not present

## 2016-04-26 DIAGNOSIS — I2510 Atherosclerotic heart disease of native coronary artery without angina pectoris: Secondary | ICD-10-CM | POA: Diagnosis not present

## 2016-04-26 DIAGNOSIS — Z951 Presence of aortocoronary bypass graft: Secondary | ICD-10-CM | POA: Insufficient documentation

## 2016-04-26 DIAGNOSIS — Z7901 Long term (current) use of anticoagulants: Secondary | ICD-10-CM | POA: Diagnosis not present

## 2016-04-26 DIAGNOSIS — I481 Persistent atrial fibrillation: Secondary | ICD-10-CM | POA: Insufficient documentation

## 2016-04-26 DIAGNOSIS — E785 Hyperlipidemia, unspecified: Secondary | ICD-10-CM | POA: Insufficient documentation

## 2016-04-26 MED ORDER — APIXABAN 5 MG PO TABS: 5.0000 mg | ORAL_TABLET | Freq: Two times a day (BID) | ORAL | Status: DC

## 2016-04-26 NOTE — Progress Notes (Signed)
Subjective:    Patient ID: [name], female    DOB: 1941/02/08, 75 y.o.   MRN: [id number] 75 year old right-handed female with history of CAD with CABG, ischemic cardiomyopathy, diastolic congestive heart failure, atrial fibrillation on chronic Coumadin.  Per chart, the patient lives with son, ambulating short distances prior to admission using a walker.  One level home.  Presented on Mar 30, 2016, with sudden onset of left-sided weakness, facial droop, and slurred speech.  Cranial CT scan showed focal hypodensity within the right basal ganglia, highly suspicious for evolving ischemia.  Possible extension into the right subinsular region.  Remote pontine infarct.  The patient did not receive tPA.  INR on admission of 1.59.  CT angiogram of head and neck showed right proximal M2 occlusion with distal reconstitution. There was completed infarct in the right caudate, common putamen, internal capsule, and posterior insula.  Echocardiogram with ejection fraction of 40% diffuse hypokinesis.  MRI, CT Mar 31, 2016, of the brain showed right MCA with hemorrhagic transformation felt to be embolic secondary to atrial fibrillation.  Plan was to begin Eliquis after repeat CT of the head DATE OF ADMISSION:  04/04/2016 DATE OF DISCHARGE:  04/12/2016  HPI   Has returned to home. She is dressing and bathing herself with the exception of washing her back. 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 She is ambulating with a rolling walker. She has used this for at least 7 years. She has had no falls at home. She is dressing herself. She is not driving  She plans to follow up with her PCP in a week or 2. Her current PA is leaving the area Has had fairly recent evaluation with nephrology, April 2017. Has not seen cardiology since Her hospitalization Has an appointment with neurology in August 2017  Pain Inventory Average Pain 8 Pain Right Now 8 My pain is intermittent, sharp and  aching  In the last 24 hours, has pain interfered with the following? General activity 3 Relation with others 6 Enjoyment of life 5 What TIME of day is your pain at its worst? daytime Sleep (in general) Good  Pain is worse with: walking and standing Pain improves with: rest and medication Relief from Meds: 2  Mobility use a walker how many minutes can you walk? 10 ability to climb steps?  no do you drive?  no Do you have any goals in this area?  yes  Function retired I need assistance with the following:  bathing, meal prep, household duties and shopping  Neuro/Psych bladder control problems tingling trouble walking  Prior Studies Any changes since last visit?  no  Physicians involved in your care Any changes since last visit?  no   Family History  Problem Relation Age of Onset  . Clotting disorder Mother     Cerebral hemorrhage  . Emphysema Father     COD  . Lung cancer Brother   . Heart disease Neg Hx    Social History   Social History  . Marital Status: Widowed    Spouse Name: N/A  . Number of Children: N/A  . Years of Education: N/A   Social History Main Topics  . Smoking status: Never Smoker   . Smokeless tobacco: Never Used  . Alcohol Use: No  . Drug Use: No  . Sexual Activity: Not Currently   Other Topics Concern  . None   Social History Narrative   Widowed   Lives alone in [place]  2 children   Not routinely exercising   Past Surgical History  Procedure Laterality Date  . Ventral hernia repair  10/1999    With mesh  . Total knee arthroplasty Right 1994  . Appendectomy  1946  . Joint replacement    . Hernia repair    . Cataract extraction Right 2012  . Coronary angioplasty with stent placement  2009    "put 2 stents in"  . Tee with cardioversion      Postoperative in 2009;  S/P transesophageal echocardiography-guided cardioversion,   Past Medical History  Diagnosis Date  . CAD (coronary artery disease) of artery bypass  graft 05/2008    S/P CABG in 2009  . Ischemic cardiomyopathy   . Systolic heart failure   . Atrial fibrillation ([place])     Postoperative in 2009;  S/P transesophageal echocardiography-guided cardioversion, previously on Amiodarone and Coumadin; recurrent in April 2013  . Hyperlipidemia   . Chronic anticoagulation   . Obesities, morbid ([place])   . Hypertension   . Heart murmur   . CHF (congestive heart failure) ([place])   . Type II diabetes mellitus ([place])   . TIA (transient ischemic attack) 03/30/2016  . Arthritis     "qwhere" (03/30/2016)  . Chronic lower back pain   . Chronic kidney disease (CKD), stage III (moderate)    BP 134/83 mmHg  Pulse 84  Resp 14  SpO2 95%  Opioid Risk Score:   Fall Risk Score:  `1  Depression screen PHQ 2/9  No flowsheet data found.     Review of Systems  Constitutional: Positive for unexpected weight change.       High blood sugar  All other systems reviewed and are negative.      Objective:   Physical Exam  Constitutional: She is oriented to person, place, and time. She appears well-developed and well-nourished.  HENT:  Head: Normocephalic and atraumatic.  Right Ear: External ear normal.  Left Ear: External ear normal.  Eyes: Conjunctivae and EOM are normal. Pupils are equal, round, and reactive to light.  Neck: Normal range of motion.  Musculoskeletal:       Lumbar back: She exhibits decreased range of motion and deformity.  Reduced lumbar lordosis  Ambulates with rolling walker no evidence of toe drag or knee instability  Forward flexed posture  Neurological: She is alert and oriented to person, place, and time. She displays no tremor. No sensory deficit. She exhibits normal muscle tone. Coordination normal.  Motor strength 5/5 bilateral deltoid, bicep, tricep, gait grip  No evidence of dysdiadochokinesis  No evidence of fine motor deficits on finger to thumb opposition    Psychiatric: She has a normal mood and affect.  Nursing  note and vitals reviewed.         Assessment & Plan:   Medical Problem List and Plan: 1.  Left hemiparesis, facial droop  secondary to right MCA infarct with hemorrhagic transformation felt to be embolic secondary to atrial fibrillation. Continue home health PT, OT, follow-up with neurology on 06/21/2016, Dr Erlinda Hong             2. Pain Management/chronic back pain: Neurontin 400 mg daily at bedtime, Tramadol 100 mg daily 50 mg at bedtime as prior to admission,  will follow up with her PCP, Has been through spinal injections and surgical evaluation. No surgery was planned Not a good candidate for repeat lumbar injections given anticoagulation 3. Hypertension. Coreg 6.25 mg twice a day, Lasix 40 mg daily. Follow-up  with PCP in [place]    4. Fluids/Electrolytes/Nutrition: Routine I&O's with follow-up chemistries 5. Atrial fibrillation. Coumadin discontinued due to hemorrhagic transformation. Cont ELIQUIS I'll hope with Dr. Julianne Handler I filled out her patient assistance form for a 1 year supply but all further prescriptions will need to be through cardiology 6. Diabetes mellitus of peripheral neuropathy. Hemoglobin A1c 8.1. Lantus insulin 18 units daily. Check blood sugars before meals and at bedtime. Diabetic teaching 7. Diastolic congestive heart failure. Weigh patient daily. Continue Lasix 40 mg daily. Monitor for any signs of fluid overload 8. CAD status post CABG. Aspirin and Coumadin discontinued due to hemorrhagic conversion. No chest pain or shortness of breath. 9. Chronic renal insufficiency. Creatinine 1.3-1.5. Follow-up chemistries  10. Hyperlipidemia. Lipitor

## 2016-04-26 NOTE — Telephone Encounter (Signed)
Pt called stating that she forgot to ask [place] when she can start driving...please advise.

## 2016-04-26 NOTE — Telephone Encounter (Signed)
May resume driving  Graduated return to driving instructions were provided. It is recommended that the patient first drives with another licensed driver in an empty parking lot. If the patient does well with this, and they can drive on a quiet street with the licensed driver. If the patient does well with this they can drive on a busy street with a licensed driver. If the patient does well with this, the next time out they can go by himself. For the first month after resuming driving, I recommend no nighttime or Interstate driving.

## 2016-04-27 NOTE — Telephone Encounter (Signed)
Patient was seen yesterday and wanted to know if she is released to drive.

## 2016-04-30 NOTE — Telephone Encounter (Signed)
Pt has been advised.

## 2016-05-21 DIAGNOSIS — I1310 Hypertensive heart and chronic kidney disease without heart failure, with stage 1 through stage 4 chronic kidney disease, or unspecified chronic kidney disease: Secondary | ICD-10-CM | POA: Diagnosis not present

## 2016-05-21 DIAGNOSIS — I69992 Facial weakness following unspecified cerebrovascular disease: Secondary | ICD-10-CM | POA: Diagnosis not present

## 2016-05-21 DIAGNOSIS — I69354 Hemiplegia and hemiparesis following cerebral infarction affecting left non-dominant side: Secondary | ICD-10-CM | POA: Diagnosis not present

## 2016-05-21 DIAGNOSIS — I4891 Unspecified atrial fibrillation: Secondary | ICD-10-CM | POA: Diagnosis not present

## 2016-05-22 ENCOUNTER — Encounter: Payer: Self-pay | Admitting: Physician Assistant

## 2016-05-22 ENCOUNTER — Ambulatory Visit (INDEPENDENT_AMBULATORY_CARE_PROVIDER_SITE_OTHER): Payer: Medicare Other | Admitting: Physician Assistant

## 2016-05-22 VITALS — BP 152/84 | HR 81 | Ht 69.0 in | Wt 279.0 lb

## 2016-05-22 DIAGNOSIS — E1140 Type 2 diabetes mellitus with diabetic neuropathy, unspecified: Secondary | ICD-10-CM

## 2016-05-22 DIAGNOSIS — N183 Chronic kidney disease, stage 3 unspecified: Secondary | ICD-10-CM

## 2016-05-22 DIAGNOSIS — I255 Ischemic cardiomyopathy: Secondary | ICD-10-CM | POA: Diagnosis not present

## 2016-05-22 DIAGNOSIS — I25810 Atherosclerosis of coronary artery bypass graft(s) without angina pectoris: Secondary | ICD-10-CM | POA: Diagnosis not present

## 2016-05-22 DIAGNOSIS — I6340 Cerebral infarction due to embolism of unspecified cerebral artery: Secondary | ICD-10-CM | POA: Diagnosis not present

## 2016-05-22 DIAGNOSIS — I481 Persistent atrial fibrillation: Secondary | ICD-10-CM | POA: Diagnosis not present

## 2016-05-22 DIAGNOSIS — E785 Hyperlipidemia, unspecified: Secondary | ICD-10-CM

## 2016-05-22 DIAGNOSIS — I4819 Other persistent atrial fibrillation: Secondary | ICD-10-CM

## 2016-05-22 DIAGNOSIS — Z794 Long term (current) use of insulin: Secondary | ICD-10-CM

## 2016-05-22 LAB — CBC
HEMATOCRIT: 43.3 % (ref 35.0–45.0)
HEMOGLOBIN: 14.3 g/dL (ref 11.7–15.5)
MCH: 30 pg (ref 27.0–33.0)
MCHC: 33 g/dL (ref 32.0–36.0)
MCV: 90.8 fL (ref 80.0–100.0)
MPV: 9.5 fL (ref 7.5–12.5)
Platelets: 173 10*3/uL (ref 140–400)
RBC: 4.77 MIL/uL (ref 3.80–5.10)
RDW: 15.1 % — ABNORMAL HIGH (ref 11.0–15.0)
WBC: 5.7 10*3/uL (ref 3.8–10.8)

## 2016-05-22 NOTE — Progress Notes (Signed)
Cardiology Office Note    Date:  05/22/2016   ID:  [name], [name] 12-17-1940, MRN [id number]  PCP:  Christianne Dolin, FNP  Cardiologist:  Dr. Angelena Form  Chief Complaint  Patient presents with  . Hospitalization Follow-up    seen for Dr. Angelena Form    History of Present Illness:  [name] is a 75 y.o. female with PMH of CAD s/p CABG 2009, ICM, persistent atrial fibrillation, systolic HF, HLD, DM and CKD stage III. She was started on Coumadin in 2013 for recurrent atrial fibrillation. She has refused cardioversion in the past. She was recently hospitalized from 5/19-5/31 with acute CVA in the setting of subtherapeutic INR. She presented on 5/19 with sudden onset of left-sided weakness and left facial droop. Her INR was subtherapeutic despite good compliance with Coumadin. CTA of the head obtained on 5/19 showed a completed infarct in the right caudate, putamen, internal capsule and the posterior insula of the right proximal M2. MRI of the brain showed acute/subacute infarct involving right caudate head, lentiform nucleus, history of cortex, and portions of the right temporal lobe. Her anticoagulation was on hold due to small hemorrhagic conversion. She was outside the TPA window. Echocardiogram obtained on 03/30/2016 showed EF 35-40%, mild MR, moderate TR, mildly dilated left atrium, PA peak pressure 49 mmHg. Of note, previous echo in February 2014 showed EF 40-45%. She was eventually discharged to [facility] on 5/23. Prior to discharge, neurology recommended started on eliquis if repeat head CT obtained on day 7 of the stroke is stable. Repeat CT of the head obtained on 5/26 showed stable appearance of the right basal ganglia hemorrhagic infarct. Eliquis was eventually initiated on 04/11/2016 prior to release from physical therapy.  She presents today for cardiac neurology follow-up. EKG shows atrial fibrillation with heart rate of 81 bpm. She has been doing relatively well at home, she  did initially have some headache, but has since resolved. She is still dealing with balance issues, however it is getting better as well. She denies any chest discomfort or shortness of breath. Her blood pressure has been doing relatively well at home despite stopping the losartan. Today her blood pressure is 152/84 which is surprising to her. Given the fact that this is a single blood pressure reading, I am hesitant to restart her on losartan. I have instructed her to continue to monitor the blood pressure at home. If continued to be high, we will add 25 mg daily losartan. Will obtain a CBC today, however she denies any obvious sign of bleeding.   Past Medical History  Diagnosis Date  . CAD (coronary artery disease) of artery bypass graft 05/2008    S/P CABG in 2009  . Ischemic cardiomyopathy   . Systolic heart failure   . Atrial fibrillation ([place])     Postoperative in 2009;  S/P transesophageal echocardiography-guided cardioversion, previously on Amiodarone and Coumadin; recurrent in April 2013  . Hyperlipidemia   . Chronic anticoagulation   . Obesities, morbid ([place])   . Hypertension   . Heart murmur   . CHF (congestive heart failure) ([place])   . Type II diabetes mellitus ([place])   . TIA (transient ischemic attack) 03/30/2016  . Arthritis     "qwhere" (03/30/2016)  . Chronic lower back pain   . Chronic kidney disease (CKD), stage III (moderate)     Past Surgical History  Procedure Laterality Date  . Ventral hernia repair  10/1999    With mesh  . Total  knee arthroplasty Right 1994  . Appendectomy  1946  . Joint replacement    . Hernia repair    . Cataract extraction Right 2012  . Coronary angioplasty with stent placement  2009    "put 2 stents in"  . Tee with cardioversion      Postoperative in 2009;  S/P transesophageal echocardiography-guided cardioversion,    Current Medications: Outpatient Prescriptions Prior to Visit  Medication Sig Dispense Refill  . apixaban (ELIQUIS) 5  MG TABS tablet Take 1 tablet (5 mg total) by mouth 2 (two) times daily. 60 tablet 11  . atorvastatin (LIPITOR) 40 MG tablet Take 1 tablet (40 mg total) by mouth daily at 6 PM. 30 tablet 0  . carvedilol (COREG) 6.25 MG tablet Take 1 tablet (6.25 mg total) by mouth 2 (two) times daily with a meal. 60 tablet 6  . cholecalciferol (VITAMIN D) 1000 UNITS tablet Take 1,000 Units by mouth 2 (two) times daily.    Marland Kitchen docusate sodium (COLACE) 100 MG capsule Take 100 mg by mouth 2 (two) times daily.     . furosemide (LASIX) 40 MG tablet Take 1 tablet (40 mg total) by mouth daily. 30 tablet 0  . gabapentin (NEURONTIN) 400 MG capsule Take 1 capsule (400 mg total) by mouth at bedtime. 30 capsule 1  . insulin glargine (LANTUS) 100 UNIT/ML injection Inject 0.18 mLs (18 Units total) into the skin at bedtime. 10 mL 11  . nitroGLYCERIN (NITROSTAT) 0.4 MG SL tablet Place 0.4 mg under the tongue every 5 (five) minutes as needed. May repeat for up to 3 doses.     . potassium chloride SA (K-DUR,KLOR-CON) 20 MEQ tablet Take 1 tablet (20 mEq total) by mouth daily. 30 tablet 1  . rOPINIRole (REQUIP) 0.5 MG tablet TK 2 TS PO QHS  0  . traMADol (ULTRAM) 50 MG tablet Take 50-100 mg by mouth 2 (two) times daily. Take 2 tablets every morning and take 1 tablet at night    . vitamin B-12 (CYANOCOBALAMIN) 1000 MCG tablet Take 1,000 mcg by mouth daily.     No facility-administered medications prior to visit.     Allergies:   Benazepril; Fe-succ-c-thre-b12-des stomach; Spironolactone; and Sulfamethoxazole-trimethoprim   Social History   Social History  . Marital Status: Widowed    Spouse Name: N/A  . Number of Children: N/A  . Years of Education: N/A   Social History Main Topics  . Smoking status: Never Smoker   . Smokeless tobacco: Never Used  . Alcohol Use: No  . Drug Use: No  . Sexual Activity: Not Currently   Other Topics Concern  . None   Social History Narrative   Widowed   Lives alone in [place]   2  children   Not routinely exercising     Family History:  The patient's family history includes Clotting disorder in her mother; Emphysema in her father; Lung cancer in her brother. There is no history of Heart disease.   ROS:   Please see the history of present illness.    ROS All other systems reviewed and are negative.   PHYSICAL EXAM:   VS:  BP 152/84 mmHg  Pulse 81  Ht 5\' 9"  (1.753 m)  Wt 279 lb (126.554 kg)  BMI 41.18 kg/m2   GEN: Well nourished, well developed, in no acute distress HEENT: normal Neck: no JVD, carotid bruits, or masses Cardiac: irregularly irregular; no murmurs, rubs, or gallops,no edema  Respiratory:  clear to auscultation bilaterally, normal work  of breathing GI: soft, nontender, nondistended, + BS MS: no deformity or atrophy Skin: warm and dry, no rash Neuro:  Alert and Oriented x 3, Strength and sensation are intact Psych: euthymic mood, full affect  Wt Readings from Last 3 Encounters:  05/22/16 279 lb (126.554 kg)  04/12/16 308 lb 3.3 oz (139.8 kg)  03/30/16 300 lb (136.079 kg)      Studies/Labs Reviewed:   EKG:  EKG is ordered today.  The ekg ordered today demonstrates afib with HR 81  Recent Labs: 04/05/2016: ALT 18; BUN 34*; Creatinine, Ser 1.18*; Potassium 3.7; Sodium 137 05/22/2016: Hemoglobin 14.3; Platelets 173   Lipid Panel    Component Value Date/Time   CHOL 102 03/30/2016 0740   TRIG 50 03/30/2016 0740   HDL 39* 03/30/2016 0740   CHOLHDL 2.6 03/30/2016 0740   VLDL 10 03/30/2016 0740   LDLCALC 53 03/30/2016 0740    Additional studies/ records that were reviewed today include:  TTE 03/30/2016 LV EF: 35% - 40%  ------------------------------------------------------------------- Indications: CVA 436.  ------------------------------------------------------------------- History: PMH: Atrial fibrillation. Coronary artery disease. Cardiomyopathy of unknown etiology. Risk factors:  Diabetes mellitus.  ------------------------------------------------------------------- Study Conclusions  - Left ventricle: The cavity size was normal. Wall thickness was  normal. Systolic function was moderately reduced. The estimated  ejection fraction was in the range of 35% to 40%. Diffuse  hypokinesis. Doppler parameters are consistent with high  ventricular filling pressure. - Aortic valve: There was trivial regurgitation. - Mitral valve: Calcified annulus. There was mild regurgitation. - Left atrium: The atrium was mildly dilated. - Right atrium: The atrium was mildly dilated. - Tricuspid valve: There was moderate regurgitation. - Pulmonary arteries: Systolic pressure was moderately increased.  PA peak pressure: 49 mm Hg (S).  Impressions:  - Technically difficult; definity used; moderate global LV  dysfunction (EF 49); trace AI; mild MR; mild biatrial  enlargement; moderate TR; moderately elevated pulmonary pressure.    ASSESSMENT:    1. Persistent atrial fibrillation ([place])   2. Ischemic cardiomyopathy   3. Coronary artery disease involving coronary bypass graft of native heart without angina pectoris   4. Cerebrovascular accident (CVA) due to embolism of cerebral artery ([place])   5. Hyperlipidemia   6. Type 2 diabetes mellitus with diabetic neuropathy, with long-term current use of insulin ([place])   7. CKD (chronic kidney disease), stage III      PLAN:  In order of problems listed above:  1. Acute CVA in the setting of subtherapeutic INR  - presented with L sided weakness, outside TPA window. Has since been improving in rehab. Currently still has some imbalance issue. Coumadin stopped due to hemorrhagic conversion, repeat CT of head is stable on 6/26, she was started on eliquis 5 mg twice a day on 6/31  - Currently doing well, no acute issues. Obtain CBC today  2. Persistent atrial fibrillation on eliquis: Heart rate very well controlled. No longer on  Coumadin due to her labile INR and stroke with subtherapeutic INR while she was compliant with medication.  3. CAD s/p CABG 2009: no angina  4. ICM/ systolic HF: Last echocardiogram obtained during recent hospitalization showed EF 35-40%. No sign of fluid overload on physical exam.  5. HLD: on lipitor.  6. DM: on insulin    Medication Adjustments/Labs and Tests Ordered: Current medicines are reviewed at length with the patient today.  Concerns regarding medicines are outlined above.  Medication changes, Labs and Tests ordered today are listed in the Patient Instructions below.  Patient Instructions  Medication Instructions:  Your physician recommends that you continue on your current medications as directed. Please refer to the Current Medication list given to you today.   Labwork: TODAY: CBC  Testing/Procedures: None ordered  Follow-Up: Your physician recommends that you schedule a follow-up appointment in: 3 MONTHS WITH DR. Angelena Form   Any Other Special Instructions Will Be Listed Below (If Applicable).     If you need a refill on your cardiac medications before your next appointment, please call your pharmacy.       Hilbert Corrigan, [place]  05/22/2016 10:29 PM    [address], [place], [place]  [address] Phone: [phone]; Fax: [phone]

## 2016-05-22 NOTE — Patient Instructions (Signed)
Medication Instructions:  Your physician recommends that you continue on your current medications as directed. Please refer to the Current Medication list given to you today.   Labwork: TODAY: CBC  Testing/Procedures: None ordered  Follow-Up: Your physician recommends that you schedule a follow-up appointment in: 3 MONTHS WITH DR. Angelena Form   Any Other Special Instructions Will Be Listed Below (If Applicable).     If you need a refill on your cardiac medications before your next appointment, please call your pharmacy.

## 2016-05-24 ENCOUNTER — Telehealth: Payer: Self-pay | Admitting: Cardiovascular Disease

## 2016-05-24 NOTE — Telephone Encounter (Signed)
F/u Message  Pt returning RN call. Please back to discuss

## 2016-05-24 NOTE — Telephone Encounter (Signed)
Spoke with pt and reviewed CBC results with her.

## 2016-06-21 ENCOUNTER — Ambulatory Visit (INDEPENDENT_AMBULATORY_CARE_PROVIDER_SITE_OTHER): Payer: Medicare Other | Admitting: Neurology

## 2016-06-21 ENCOUNTER — Encounter: Payer: Self-pay | Admitting: Neurology

## 2016-06-21 VITALS — BP 102/61 | HR 67 | Ht 69.0 in | Wt 284.8 lb

## 2016-06-21 DIAGNOSIS — I25810 Atherosclerosis of coronary artery bypass graft(s) without angina pectoris: Secondary | ICD-10-CM

## 2016-06-21 DIAGNOSIS — E1159 Type 2 diabetes mellitus with other circulatory complications: Secondary | ICD-10-CM | POA: Diagnosis not present

## 2016-06-21 DIAGNOSIS — Z7901 Long term (current) use of anticoagulants: Secondary | ICD-10-CM | POA: Diagnosis not present

## 2016-06-21 DIAGNOSIS — I63411 Cerebral infarction due to embolism of right middle cerebral artery: Secondary | ICD-10-CM

## 2016-06-21 DIAGNOSIS — I481 Persistent atrial fibrillation: Secondary | ICD-10-CM

## 2016-06-21 DIAGNOSIS — I10 Essential (primary) hypertension: Secondary | ICD-10-CM | POA: Diagnosis not present

## 2016-06-21 DIAGNOSIS — E785 Hyperlipidemia, unspecified: Secondary | ICD-10-CM | POA: Diagnosis not present

## 2016-06-21 DIAGNOSIS — I4819 Other persistent atrial fibrillation: Secondary | ICD-10-CM

## 2016-06-21 NOTE — Patient Instructions (Addendum)
-   continue eliquis and lipitor for stroke prevention - discuss with cardiology next visit regarding whether you still need ASA due to history of CAD and bypass surgery - check BP and glucose at home - Follow up with your primary care physician for stroke risk factor modification. Recommend maintain blood pressure goal <130/80, diabetes with hemoglobin A1c goal below 7.0% and lipids with LDL cholesterol goal below 70 mg/dL.  - healthy diet and home exercise - follow up in 4 months

## 2016-06-21 NOTE — Progress Notes (Signed)
STROKE NEUROLOGY FOLLOW UP NOTE  NAME: [name] DOB: 11/22/1940  REASON FOR VISIT: stroke follow up HISTORY FROM: pt and chart  Today we had the pleasure of seeing [name] in follow-up at our Neurology Clinic. Pt was accompanied by son.   History Summary [name] is a 75 y.o. female with history of CAD/MI s/p CABG, ischemic cardiomyopathy, CHF, atrial fibrillation on coumadin therapy, HLD, obesity, HTN, DM, previous TIA, and CKD admitted on 04/01/16 for left facial weakness, slurred speech in the setting of subtherapeutic INR. MRI showed Acute/subacute infarcts involve the right caudate head, lentiform nucleus, insular cortex, and portions of the right temporal lobe. Hemorrhagic conversion is noted within the right basal ganglia infarcts. CTA head and neck showed right proximal M2 occlusion with distal reconstitution. And CT repeat showed stable hematoma. TTE EF 35-40% down from previous 40-45%. A1C 8.1 and LDL 53. Her ASA and coumadin were on hold due to hemorrhagic transformation. [name] was discharged to CIR with zocor and plan to start eliquis in 7 days with repeat CT.   Interval History During the interval time, the patient has been doing well. Stayed in CIR for 2 weeks and repeat CT head stable evolving hematoma at right BG and caudate head. eliquis 5mg  bid started in CIR but not ASA. [name] followed with cardiology later and kept on eliquis but did not mention ASA. Currently, pt left sided weakness resolved, walk with walker. BP 102/61 and glucose at home 110-120 and recent A1C 6.6.   REVIEW OF SYSTEMS: Full 14 system review of systems performed and notable only for those listed below and in HPI above, all others are negative:  Constitutional:   Cardiovascular: swelling in legs Ear/Nose/Throat:   Skin: itching Eyes:  Blurry vision, double vision Respiratory:   Gastroitestinal:   Genitourinary: incontinence Hematology/Lymphatic:  Easy bruising   Endocrine:    Musculoskeletal:   Allergy/Immunology:   Neurological:   Psychiatric:  Sleep: restless leg  The following represents the patient's updated allergies and side effects list: Allergies  Allergen Reactions  . Benazepril Other (See Comments)  . Fe-Succ-C-Thre-B12-Des Stomach Other (See Comments)    Pt doesn't remember   . Spironolactone Other (See Comments)    Dizziness   . Sulfamethoxazole-Trimethoprim Other (See Comments)    Pt doesn't remember    The neurologically relevant items on the patient's problem list were reviewed on today's visit.  Neurologic Examination  A problem focused neurological exam (12 or more points of the single system neurologic examination, vital signs counts as 1 point, cranial nerves count for 8 points) was performed.  Blood pressure 102/61, pulse 67, height 5\' 9"  (1.753 m), weight 284 lb 12.8 oz (129.2 kg).  General - obese, well developed, in no apparent distress.  Ophthalmologic - Fundi not visualized due to noncooperation.  Cardiovascular - irregularly irregular heart rate and rhythm.  Mental Status -  Level of arousal and orientation to time, place, and person were intact. Language including expression, naming, repetition, comprehension was assessed and found intact. Fund of Knowledge was assessed and was intact.  Cranial Nerves II - XII - II - Visual field intact OU. III, IV, VI - Extraocular movements intact. V - Facial sensation intact bilaterally. VII - Facial movement intact bilaterally. VIII - Hearing & vestibular intact bilaterally. X - Palate elevates symmetrically. XI - Chin turning & shoulder shrug intact bilaterally. XII - Tongue protrusion intact.  Motor Strength - The patient's strength was 5/5 BUEs and  4/5 BLEs and pronator drift was absent.  Bulk was normal and fasciculations were absent.   Motor Tone - Muscle tone was assessed at the neck and appendages and was normal.  Reflexes - The patient's reflexes were 1+ in all  extremities and [name] had no pathological reflexes.  Sensory - Light touch, temperature/pinprick were assessed and were normal.    Coordination - The patient had normal movements in the hands with no ataxia or dysmetria.  Tremor was absent.  Gait and Station - walk with walker, slow but steady.   Functional score  mRS = 2   0 - No symptoms.   1 - No significant disability. Able to carry out all usual activities, despite some symptoms.   2 - Slight disability. Able to look after own affairs without assistance, but unable to carry out all previous activities.   3 - Moderate disability. Requires some help, but able to walk unassisted.   4 - Moderately severe disability. Unable to attend to own bodily needs without assistance, and unable to walk unassisted.   5 - Severe disability. Requires constant nursing care and attention, bedridden, incontinent.   6 - Dead.   NIH Stroke Scale   Level Of Consciousness 0=Alert; keenly responsive 1=Not alert, but arousable by minor stimulation 2=Not alert, requires repeated stimulation 3=Responds only with reflex movements 0  LOC Questions to Month and Age 55=Answers both questions correctly 1=Answers one question correctly 2=Answers neither question correctly 0  LOC Commands      -Open/Close eyes     -Open/close grip 0=Performs both tasks correctly 1=Performs one task correctly 2=Performs neighter task correctly 0  Best Gaze 0=Normal 1=Partial gaze palsy 2=Forced deviation, or total gaze paresis 0  Visual 0=No visual loss 1=Partial hemianopia 2=Complete hemianopia 3=Bilateral hemianopia (blind including cortical blindness) 0  Facial Palsy 0=Normal symmetrical movement 1=Minor paralysis (asymmetry) 2=Partial paralysis (lower face) 3=Complete paralysis (upper and lower face) 0  Motor  0=No drift, limb holds posture for full 10 seconds 1=Drift, limb holds posture, no drift to bed 2=Some antigravity effort, cannot maintain posture,  drifts to bed 3=No effort against gravity, limb falls 4=No movement Right Arm 0     Leg 1    Left Arm 0     Leg 1  Limb Ataxia 0=Absent 1=Present in one limb 2=Present in two limbs 0  Sensory 0=Normal 1=Mild to moderate sensory loss 2=Severe to total sensory loss 0  Best Language 0=No aphasia, normal 1=Mild to moderate aphasia 2=Mute, global aphasia 3=Mute, global aphasia 0  Dysarthria 0=Normal 1=Mild to moderate 2=Severe, unintelligible or mute/anarthric 0  Extinction/Neglect 0=No abnormality 1=Extinction to bilateral simultaneous stimulation 2=Profound neglect 0  Total   2     Data reviewed: I personally reviewed the images and agree with the radiology interpretations.  Ct Angio Head and neck W/cm &/or Wo Cm 03/30/2016   1. Right proximal M2 occlusion with distal reconstitution. There is completed infarct in the right caudate, putamen, internal capsule, and posterior insula.  2. No flow limiting stenosis or embolic source identified in the neck.   Ct Head Wo Contrast 03/30/2016   1. Focal hypodensity within the right basal ganglia, highly suspicious for evolving ischemia. Possible extension into the right subinsular region and insular cortex may be present as well. No associated hemorrhage.  2. Asymmetric hyperdensity within the distal right M1 segment, which may reflect intraluminal thrombus versus atheromatous plaque.  3. Remote pontine infarct.  4. Generalized cerebral atrophy with chronic microvascular ischemic disease  and intracranial atherosclerosis.   Mr Brain Wo Contrast 03/30/2016   Hemorrhagic conversion is noted within the right basal ganglia infarcts since the earlier CT scan.   Edema and mass effect is noted with the petechial hemorrhage and infarct.  There is partial effacement of the right lateral ventricle. T2 changes are associated with the areas of acute infarct. Impression 1. Acute/subacute infarcts involve the right caudate head, lentiform nucleus,  insular cortex, and portions of the right temporal lobe.  2. Moderate generalized atrophy and diffuse white matter disease likely reflects the sequela of chronic microvascular ischemia in addition to the acute infarcts.   Transthoracic echocardiogram 03/30/2016 Study Conclusions - Left ventricle: The cavity size was normal. Wall thickness was  normal. Systolic function was moderately reduced. The estimated  ejection fraction was in the range of 35% to 40%. Diffuse  hypokinesis. Doppler parameters are consistent with high  ventricular filling pressure. - Aortic valve: There was trivial regurgitation. - Mitral valve: Calcified annulus. There was mild regurgitation. - Left atrium: The atrium was mildly dilated. - Right atrium: The atrium was mildly dilated. - Tricuspid valve: There was moderate regurgitation. - Pulmonary arteries: Systolic pressure was moderately increased.  PA peak pressure: 49 mm Hg (S). Impressions: - Technically difficult; definity used; moderate global LV  dysfunction (EF 49); trace AI; mild MR; mild biatrial  enlargement; moderate TR; moderately elevated pulmonary pressure.  Ct Head Wo Contrast   03/31/2016  IMPRESSION: 1. Stable appearance of hemorrhagic infarcts involving the right lentiform nucleus and caudate head without expansion. 2. Expected evolution of nonhemorrhagic infarcts involving the posterior right insular cortex and temporal lobe. 3. Stable white matter disease without other acute infarct.   Component     Latest Ref Rng & Units 03/30/2016  Cholesterol     0 - 200 mg/dL 102  Triglycerides     <150 mg/dL 50  HDL Cholesterol     >40 mg/dL 39 (L)  Total CHOL/HDL Ratio     RATIO 2.6  VLDL     0 - 40 mg/dL 10  LDL (calc)     0 - 99 mg/dL 53  Hemoglobin A1C     4.8 - 5.6 % 8.1 (H)  Mean Plasma Glucose     mg/dL 186    Assessment: As you may recall, [name] is a 75 y.o. Caucasian female with PMH of CAD/MI s/p CABG, ischemic  cardiomyopathy, CHF, atrial fibrillation on coumadin therapy, HLD, obesity, HTN, DM, previous TIA, and CKD admitted on 04/01/16 for acute/subacute infarcts involve the right caudate head, lentiform nucleus, insular cortex, and portions of the right temporal lobe in the setting of subtherapeutic INR. Hemorrhagic conversion is noted within the right basal ganglia infarcts. CTA head and neck showed right proximal M2 occlusion with distal reconstitution. And CT repeat showed stable hematoma. TTE EF 35-40% down from previous 40-45%. A1C 8.1 and LDL 53. Her ASA and coumadin were on hold due to hemorrhagic transformation. [name] was discharged to CIR with zocor and started eliquis in CIR with stable repeat CT. ASA not started at that time. [name] followed with cardiology later and kept on eliquis but did not mention ASA. Currently, pt left sided weakness resolved, walk with walker. BP and glucose controlled well.   Plan:  - continue eliquis and lipitor for stroke prevention - pt to discuss with cardiology next visit regarding whether still need ASA due to history of CAD s/p CABG - check BP and glucose at home - Follow up with your  primary care physician for stroke risk factor modification. Recommend maintain blood pressure goal <130/80, diabetes with hemoglobin A1c goal below 7.0% and lipids with LDL cholesterol goal below 70 mg/dL.  - healthy diet and home exercise - follow up in 4 months  I spent more than 25 minutes of face to face time with the patient. Greater than 50% of time was spent in counseling and coordination of care. We discussed continue eliquis, discuss with card regarding need of ASA, check BP and glucose at home.   No orders of the defined types were placed in this encounter.   Meds ordered this encounter  Medications  . gabapentin (NEURONTIN) 400 MG capsule    Sig: Take by mouth.  Marland Kitchen rOPINIRole (REQUIP) 0.25 MG tablet    Sig: Take 1 mg by mouth.  . Cyanocobalamin (RA VITAMIN B-12 TR) 1000  MCG TBCR    Sig: Take by mouth.  . DISCONTD: LANTUS SOLOSTAR 100 UNIT/ML Solostar Pen    Sig: 23 Units.     Patient Instructions  - continue eliquis and lipitor for stroke prevention - discuss with cardiology next visit regarding whether you still need ASA due to history of CAD and bypass surgery - check BP and glucose at home - Follow up with your primary care physician for stroke risk factor modification. Recommend maintain blood pressure goal <130/80, diabetes with hemoglobin A1c goal below 7.0% and lipids with LDL cholesterol goal below 70 mg/dL.  - healthy diet and home exercise - follow up in 4 months   Rosalin Hawking, MD PhD [facility] [address], [place],  [address] [phone]

## 2016-08-03 ENCOUNTER — Telehealth: Payer: Self-pay | Admitting: *Deleted

## 2016-08-03 NOTE — Telephone Encounter (Signed)
Received request from [facility] (phone [phone]) requesting pt stop Eliquis prior to colonoscopy scheduled for October 14. Pt with recent CVA (04/01/16). I reviewed with Nehemiah Massed, PharmD and if colonoscopy is routine it would be better to wait until at least 6 months post CVA.  I placed call to clinic to discuss but they are closed on Friday.

## 2016-08-06 NOTE — Telephone Encounter (Signed)
I placed call to clinic and left message on nurse line to call office.

## 2016-08-06 NOTE — Telephone Encounter (Signed)
Follow up   [name] verbalized that she is returning call for rn and she will fax information over to [facility] per previous call

## 2016-08-08 NOTE — Telephone Encounter (Signed)
I spoke with Santiago Glad (RN at digestive clinic) and colonoscopy is routine.  They will postpone until at least 6 months post CVA and contact us when rescheduled.

## 2016-08-23 ENCOUNTER — Encounter (INDEPENDENT_AMBULATORY_CARE_PROVIDER_SITE_OTHER): Payer: Self-pay

## 2016-08-23 ENCOUNTER — Encounter: Payer: Self-pay | Admitting: Cardiovascular Disease

## 2016-08-23 ENCOUNTER — Ambulatory Visit (INDEPENDENT_AMBULATORY_CARE_PROVIDER_SITE_OTHER): Payer: Medicare Other | Admitting: Cardiovascular Disease

## 2016-08-23 VITALS — BP 124/70 | HR 55 | Ht 69.0 in | Wt 281.8 lb

## 2016-08-23 DIAGNOSIS — I481 Persistent atrial fibrillation: Secondary | ICD-10-CM | POA: Diagnosis not present

## 2016-08-23 DIAGNOSIS — I10 Essential (primary) hypertension: Secondary | ICD-10-CM

## 2016-08-23 DIAGNOSIS — I25810 Atherosclerosis of coronary artery bypass graft(s) without angina pectoris: Secondary | ICD-10-CM

## 2016-08-23 DIAGNOSIS — I4819 Other persistent atrial fibrillation: Secondary | ICD-10-CM

## 2016-08-23 DIAGNOSIS — I255 Ischemic cardiomyopathy: Secondary | ICD-10-CM | POA: Diagnosis not present

## 2016-08-23 NOTE — Progress Notes (Signed)
Chief Complaint  Patient presents with  . Follow-up     History of Present Illness: 75 yo female with history of CAD with CABG 2009, ischemic cardiomyopathy, persistent atrial fibrillation, systolic CHF, HLD, DM, CKD here today for cardiac follow up. She has had persistent atrial fibrillation and was treated with coumadin but INR was difficult to control. She presented with a CVA may 2017. She was started on Eliquis in June 2017. Echo May 2017 with LVEF=35-40%, mild MR. Losartan was held during her hospitalization. She is also followed in [facility] by Dr. Audie Clear. ARB was discussed at her last visit there in July 2017 and was not restarted. ASA was also stopped in may 2017.     Here today for follow up. She is feeling well. No chest pain or SOB. No palpitations. She has had no bleeding issues.   Primary Care Physician: Christianne Dolin, FNP  Past Medical History:  Diagnosis Date  . Arthritis    "qwhere" (03/30/2016)  . Atrial fibrillation ([place])    Postoperative in 2009;  S/P transesophageal echocardiography-guided cardioversion, previously on Amiodarone and Coumadin; recurrent in April 2013  . CAD (coronary artery disease) of artery bypass graft 05/2008   S/P CABG in 2009  . CHF (congestive heart failure) ([place])   . Chronic anticoagulation   . Chronic kidney disease (CKD), stage III (moderate)   . Chronic lower back pain   . Heart murmur   . Hyperlipidemia   . Hypertension   . Ischemic cardiomyopathy   . Obesities, morbid ([place])   . Stroke ([place])   . Systolic heart failure   . TIA (transient ischemic attack) 03/30/2016  . Type II diabetes mellitus ([place])     Past Surgical History:  Procedure Laterality Date  . APPENDECTOMY  1946  . CATARACT EXTRACTION Right 2012  . CORONARY ANGIOPLASTY WITH STENT PLACEMENT  2009   "put 2 stents in"  . HERNIA REPAIR    . JOINT REPLACEMENT    . TEE WITH CARDIOVERSION     Postoperative in 2009;  S/P transesophageal  echocardiography-guided cardioversion,  . TOTAL KNEE ARTHROPLASTY Right 1994  . VENTRAL HERNIA REPAIR  10/1999   With mesh    Current Outpatient Prescriptions  Medication Sig Dispense Refill  . apixaban (ELIQUIS) 5 MG TABS tablet Take 1 tablet (5 mg total) by mouth 2 (two) times daily. 60 tablet 11  . atorvastatin (LIPITOR) 40 MG tablet Take 1 tablet (40 mg total) by mouth daily at 6 PM. 30 tablet 0  . carvedilol (COREG) 6.25 MG tablet Take 1 tablet (6.25 mg total) by mouth 2 (two) times daily with a meal. 60 tablet 6  . cholecalciferol (VITAMIN D) 1000 UNITS tablet Take 1,000 Units by mouth 2 (two) times daily.    . Cyanocobalamin (RA VITAMIN B-12 TR) 1000 MCG TBCR Take by mouth.    . docusate sodium (COLACE) 100 MG capsule Take 100 mg by mouth 2 (two) times daily.     . furosemide (LASIX) 40 MG tablet Take 1 tablet (40 mg total) by mouth daily. 30 tablet 0  . gabapentin (NEURONTIN) 400 MG capsule Take 1 capsule (400 mg total) by mouth at bedtime. 30 capsule 1  . gabapentin (NEURONTIN) 400 MG capsule Take by mouth.    . insulin glargine (LANTUS) 100 UNIT/ML injection Inject 0.18 mLs (18 Units total) into the skin at bedtime. (Patient taking differently: Inject 23 Units into the skin at bedtime. ) 10 mL 11  .  ketorolac (ACULAR) 0.4 % SOLN Place 1 drop into the left eye as directed.    . nitroGLYCERIN (NITROSTAT) 0.4 MG SL tablet Place 0.4 mg under the tongue every 5 (five) minutes as needed. May repeat for up to 3 doses.     Marland Kitchen ofloxacin (OCUFLOX) 0.3 % ophthalmic solution Place 1 drop into the left eye as directed.    . potassium chloride SA (K-DUR,KLOR-CON) 20 MEQ tablet Take 1 tablet (20 mEq total) by mouth daily. 30 tablet 1  . prednisoLONE acetate (PRED FORTE) 1 % ophthalmic suspension Place 1 drop into the left eye as directed.    Marland Kitchen rOPINIRole (REQUIP) 0.25 MG tablet Take 1 mg by mouth.    . traMADol (ULTRAM) 50 MG tablet Take 50-100 mg by mouth 2 (two) times daily. Take 2 tablets every  morning and take 1 tablet at night    . vitamin B-12 (CYANOCOBALAMIN) 1000 MCG tablet Take 1,000 mcg by mouth daily.     No current facility-administered medications for this visit.     Allergies  Allergen Reactions  . Benazepril Other (See Comments)  . Fe-Succ-C-Thre-B12-Des Stomach Other (See Comments)    Pt doesn't remember   . Spironolactone Other (See Comments)    Dizziness   . Sulfamethoxazole-Trimethoprim Other (See Comments)    Pt doesn't remember    Social History   Social History  . Marital status: Widowed    Spouse name: N/A  . Number of children: N/A  . Years of education: N/A   Occupational History  . Not on file.   Social History Main Topics  . Smoking status: Never Smoker  . Smokeless tobacco: Never Used  . Alcohol use No  . Drug use: No  . Sexual activity: Not Currently   Other Topics Concern  . Not on file   Social History Narrative   Widowed   Lives alone in [place]   2 children   Not routinely exercising    Family History  Problem Relation Age of Onset  . Clotting disorder Mother     Cerebral hemorrhage  . Emphysema Father     COD  . Lung cancer Brother   . Heart disease Neg Hx     Review of Systems:  As stated in the HPI and otherwise negative.   BP 124/70   Pulse (!) 55   Ht 5\' 9"  (1.753 m)   Wt 281 lb 12.8 oz (127.8 kg)   SpO2 99%   BMI 41.61 kg/m   Physical Examination: General: Well developed, well nourished, NAD  HEENT: OP clear, mucus membranes moist  SKIN: warm, dry. No rashes. Neuro: No focal deficits  Musculoskeletal: Muscle strength 5/5 all ext  Psychiatric: Mood and affect normal  Neck: No JVD, no carotid bruits, no thyromegaly, no lymphadenopathy.  Lungs:Clear bilaterally, no wheezes, rhonci, crackles Cardiovascular: Irregular irregular. No murmurs, gallops or rubs. Abdomen:Soft. Bowel sounds present. Non-tender.  Extremities: Trace right lower extremity edema. Pulses are 2 + in the bilateral DP/PT.  Echo  May 2017: Left ventricle: The cavity size was normal. Wall thickness was   normal. Systolic function was moderately reduced. The estimated   ejection fraction was in the range of 35% to 40%. Diffuse   hypokinesis. Doppler parameters are consistent with high   ventricular filling pressure. - Aortic valve: There was trivial regurgitation. - Mitral valve: Calcified annulus. There was mild regurgitation. - Left atrium: The atrium was mildly dilated. - Right atrium: The atrium was mildly dilated. - Tricuspid  valve: There was moderate regurgitation. - Pulmonary arteries: Systolic pressure was moderately increased.   PA peak pressure: 49 mm Hg (S).  Impressions:  - Technically difficult; definity used; moderate global LV   dysfunction (EF 49); trace AI; mild MR; mild biatrial   enlargement; moderate TR; moderately elevated pulmonary pressure.  EKG:  EKG is not ordered today. The ekg ordered today demonstrates   Recent Labs: 04/05/2016: ALT 18; BUN 34; Creatinine, Ser 1.18; Potassium 3.7; Sodium 137 05/22/2016: Hemoglobin 14.3; Platelets 173     Wt Readings from Last 3 Encounters:  08/23/16 281 lb 12.8 oz (127.8 kg)  06/21/16 284 lb 12.8 oz (129.2 kg)  05/22/16 279 lb (126.6 kg)     Other studies Reviewed: Additional studies/ records that were reviewed today include: . Review of the above records demonstrates:    Assessment and Plan:   1. Atrial fibrillation, persistent: Rate is controlled today on beta blocker. She is on Eliquis with no bleeding issues.   2. CAD: No chest pain suggestive of angina. Continue current meds including beta blocker and statin. Will continue ASA.  3. Ischemic cardiomyopathy: Stable. Continue medical therapy with beta blocker. She is off of her ARB due to hypotension. She has discussed this with her Nephrologist. Last LVEF 35-40%. She is on Lasix.   4. HTN: BP is controlled today. Will not restart ARB.   Current medicines are reviewed at length with  the patient today.  The patient does not have concerns regarding medicines.  The following changes have been made:  no change  Labs/ tests ordered today include:   No orders of the defined types were placed in this encounter.   Disposition:   FU with me in 12  months  Signed, Lauree Chandler, MD 08/23/2016 11:33 AM    [facility] [place], [place], [place]  [address] Phone: [phone]; Fax: [phone]

## 2016-08-23 NOTE — Patient Instructions (Signed)

## 2016-08-27 ENCOUNTER — Telehealth: Payer: Self-pay | Admitting: Cardiovascular Disease

## 2016-08-27 MED ORDER — ASPIRIN EC 81 MG PO TBEC: 81.0000 mg | DELAYED_RELEASE_TABLET | Freq: Every day | ORAL | 3 refills | Status: DC

## 2016-08-27 NOTE — Telephone Encounter (Signed)
I want her to take ASA 81 mg daily. I am not sure what type of ASA would state it is not good for heart patients. chris

## 2016-08-27 NOTE — Telephone Encounter (Signed)
Last office note states pt to continue ASA but it is not on her med list.  Will forward to Dr. Angelena Form to see if pt should be on daily dose of 81 mg ASA

## 2016-08-27 NOTE — Telephone Encounter (Signed)
I spoke with pt and gave her information from Dr. Angelena Form. She has purchased enteric coated Aspirin 81 mg and will start taking daily.

## 2016-08-27 NOTE — Telephone Encounter (Signed)
New Message  Pt voiced she was informed to get a certain aspirin, but she received an 81 mg, pt voiced it states not take if you're a heart patient, have high bp, or kidney disease.  Please f/u with pt

## 2016-10-22 ENCOUNTER — Ambulatory Visit (INDEPENDENT_AMBULATORY_CARE_PROVIDER_SITE_OTHER): Payer: Medicare Other | Admitting: Neurology

## 2016-10-22 ENCOUNTER — Encounter: Payer: Self-pay | Admitting: Neurology

## 2016-10-22 VITALS — BP 138/75 | HR 73 | Ht 69.0 in | Wt 276.0 lb

## 2016-10-22 DIAGNOSIS — E785 Hyperlipidemia, unspecified: Secondary | ICD-10-CM

## 2016-10-22 DIAGNOSIS — I10 Essential (primary) hypertension: Secondary | ICD-10-CM

## 2016-10-22 DIAGNOSIS — Z7901 Long term (current) use of anticoagulants: Secondary | ICD-10-CM | POA: Diagnosis not present

## 2016-10-22 DIAGNOSIS — I63411 Cerebral infarction due to embolism of right middle cerebral artery: Secondary | ICD-10-CM | POA: Diagnosis not present

## 2016-10-22 DIAGNOSIS — I481 Persistent atrial fibrillation: Secondary | ICD-10-CM

## 2016-10-22 DIAGNOSIS — I4819 Other persistent atrial fibrillation: Secondary | ICD-10-CM

## 2016-10-22 NOTE — Patient Instructions (Signed)
-   continue eliquis, baby ASA and lipitor for stroke and cardiac prevention - check BP and glucose at home - follow up with cardiology regularly  - Follow up with your primary care physician for stroke risk factor modification. Recommend maintain blood pressure goal <130/80, diabetes with hemoglobin A1c goal below 7.0% and lipids with LDL cholesterol goal below 70 mg/dL.  - healthy diet and home exercise - avoid falls, use walker for imbalance - follow up in 6 months

## 2016-10-22 NOTE — Progress Notes (Signed)
STROKE NEUROLOGY FOLLOW UP NOTE  NAME: [name] DOB: 1941/11/05  REASON FOR VISIT: stroke follow up HISTORY FROM: pt and chart  Today we had the pleasure of seeing [name] in follow-up at our Neurology Clinic. Pt was accompanied by son.   History Summary [name] is a 75 y.o. female with history of CAD/MI s/p CABG, ischemic cardiomyopathy, CHF, atrial fibrillation on coumadin therapy, HLD, obesity, HTN, DM, previous TIA, and CKD admitted on 04/01/16 for left facial weakness, slurred speech in the setting of subtherapeutic INR. MRI showed Acute/subacute infarcts involve the right caudate head, lentiform nucleus, insular cortex, and portions of the right temporal lobe. Hemorrhagic conversion is noted within the right basal ganglia infarcts. CTA head and neck showed right proximal M2 occlusion with distal reconstitution. And CT repeat showed stable hematoma. TTE EF 35-40% down from previous 40-45%. A1C 8.1 and LDL 53. Her ASA and coumadin were on hold due to hemorrhagic transformation. She was discharged to CIR with zocor and plan to start eliquis in 7 days with repeat CT.   06/21/16 follow up - the patient has been doing well. Stayed in CIR for 2 weeks and repeat CT head stable evolving hematoma at right BG and caudate head. eliquis 5mg  bid started in CIR but not ASA. She followed with cardiology later and kept on eliquis but did not mention ASA. Currently, pt left sided weakness resolved, walk with walker. BP 102/61 and glucose at home 110-120 and recent A1C 6.6.   Interval History During the interval time, she has been doing well. No recurrent stroke like symptoms. Followed with cardiology and added ASA 81mg  in addition to eliquis for cardiac prevention. BP today 138/75 and she said her BP at home stable about the same. Her glucose at home ranging from 100-120. She still has bilateral hand tingling from DM neuropathy and on neurontin 400mg  Qhs. She is going to follow up with  her PCP.   REVIEW OF SYSTEMS: Full 14 system review of systems performed and notable only for those listed below and in HPI above, all others are negative:  Constitutional:   Cardiovascular: swelling in legs Ear/Nose/Throat:  Hearing loss, ringing in ears Skin:  Eyes:   Respiratory:   Gastroitestinal:   Genitourinary: incontinence of bladder Hematology/Lymphatic:  Easy bruising   Endocrine:  Musculoskeletal: back pain, muscle cramps, walking difficulty Allergy/Immunology:   Neurological:  numbness Psychiatric:  Sleep: restless leg  The following represents the patient's updated allergies and side effects list: Allergies  Allergen Reactions  . Benazepril Other (See Comments)  . Fe-Succ-C-Thre-B12-Des Stomach Other (See Comments)    Pt doesn't remember   . Spironolactone Other (See Comments)    Dizziness   . Sulfamethoxazole-Trimethoprim Other (See Comments)    Pt doesn't remember    The neurologically relevant items on the patient's problem list were reviewed on today's visit.  Neurologic Examination  A problem focused neurological exam (12 or more points of the single system neurologic examination, vital signs counts as 1 point, cranial nerves count for 8 points) was performed.  Blood pressure 138/75, pulse 73, height 5\' 9"  (1.753 m), weight 276 lb (125.2 kg).  General - obese, well developed, in no apparent distress.  Ophthalmologic - Fundi not visualized due to noncooperation.  Cardiovascular - irregularly irregular heart rate and rhythm.  Mental Status -  Level of arousal and orientation to time, place, and person were intact. Language including expression, naming, repetition, comprehension was assessed and found  intact. Fund of Knowledge was assessed and was intact.  Cranial Nerves II - XII - II - Visual field intact OU. III, IV, VI - Extraocular movements intact. V - Facial sensation intact bilaterally. VII - Facial movement intact bilaterally. VIII -  Hearing & vestibular intact bilaterally. X - Palate elevates symmetrically. XI - Chin turning & shoulder shrug intact bilaterally. XII - Tongue protrusion intact.  Motor Strength - The patient's strength was 5/5 BUEs and 4/5 BLEs and pronator drift was absent.  Bulk was normal and fasciculations were absent.   Motor Tone - Muscle tone was assessed at the neck and appendages and was normal.  Reflexes - The patient's reflexes were 1+ in all extremities and she had no pathological reflexes.  Sensory - Light touch, temperature/pinprick were assessed and were normal.    Coordination - The patient had normal movements in the hands with no ataxia or dysmetria.  Tremor was absent.  Gait and Station - walk with walker, slow but steady.   Data reviewed: I personally reviewed the images and agree with the radiology interpretations.  Ct Angio Head and neck W/cm &/or Wo Cm 03/30/2016   1. Right proximal M2 occlusion with distal reconstitution. There is completed infarct in the right caudate, putamen, internal capsule, and posterior insula.  2. No flow limiting stenosis or embolic source identified in the neck.   Ct Head Wo Contrast 03/30/2016   1. Focal hypodensity within the right basal ganglia, highly suspicious for evolving ischemia. Possible extension into the right subinsular region and insular cortex may be present as well. No associated hemorrhage.  2. Asymmetric hyperdensity within the distal right M1 segment, which may reflect intraluminal thrombus versus atheromatous plaque.  3. Remote pontine infarct.  4. Generalized cerebral atrophy with chronic microvascular ischemic disease and intracranial atherosclerosis.   Mr Brain Wo Contrast 03/30/2016   Hemorrhagic conversion is noted within the right basal ganglia infarcts since the earlier CT scan.   Edema and mass effect is noted with the petechial hemorrhage and infarct.  There is partial effacement of the right lateral ventricle. T2  changes are associated with the areas of acute infarct. Impression 1. Acute/subacute infarcts involve the right caudate head, lentiform nucleus, insular cortex, and portions of the right temporal lobe.  2. Moderate generalized atrophy and diffuse white matter disease likely reflects the sequela of chronic microvascular ischemia in addition to the acute infarcts.   Transthoracic echocardiogram 03/30/2016 Study Conclusions - Left ventricle: The cavity size was normal. Wall thickness was  normal. Systolic function was moderately reduced. The estimated  ejection fraction was in the range of 35% to 40%. Diffuse  hypokinesis. Doppler parameters are consistent with high  ventricular filling pressure. - Aortic valve: There was trivial regurgitation. - Mitral valve: Calcified annulus. There was mild regurgitation. - Left atrium: The atrium was mildly dilated. - Right atrium: The atrium was mildly dilated. - Tricuspid valve: There was moderate regurgitation. - Pulmonary arteries: Systolic pressure was moderately increased.  PA peak pressure: 49 mm Hg (S). Impressions: - Technically difficult; definity used; moderate global LV  dysfunction (EF 49); trace AI; mild MR; mild biatrial  enlargement; moderate TR; moderately elevated pulmonary pressure.  Ct Head Wo Contrast   03/31/2016  IMPRESSION: 1. Stable appearance of hemorrhagic infarcts involving the right lentiform nucleus and caudate head without expansion. 2. Expected evolution of nonhemorrhagic infarcts involving the posterior right insular cortex and temporal lobe. 3. Stable white matter disease without other acute infarct.   Component  Latest Ref Rng & Units 03/30/2016  Cholesterol     0 - 200 mg/dL 102  Triglycerides     <150 mg/dL 50  HDL Cholesterol     >40 mg/dL 39 (L)  Total CHOL/HDL Ratio     RATIO 2.6  VLDL     0 - 40 mg/dL 10  LDL (calc)     0 - 99 mg/dL 53  Hemoglobin A1C     4.8 - 5.6 % 8.1 (H)  Mean  Plasma Glucose     mg/dL 186    Assessment: As you may recall, she is a 75 y.o. Caucasian female with PMH of CAD/MI s/p CABG, ischemic cardiomyopathy, CHF, atrial fibrillation on coumadin therapy, HLD, obesity, HTN, DM, previous TIA, and CKD admitted on 04/01/16 for acute/subacute infarcts involve the right caudate head, lentiform nucleus, insular cortex, and portions of the right temporal lobe in the setting of subtherapeutic INR. Hemorrhagic conversion is noted within the right basal ganglia infarcts. CTA head and neck showed right proximal M2 occlusion with distal reconstitution. And CT repeat showed stable hematoma. TTE EF 35-40% down from previous 40-45%. A1C 8.1 and LDL 53. Her ASA and coumadin were on hold due to hemorrhagic transformation. She was discharged to CIR with zocor and started eliquis in CIR with stable repeat CT. She followed with cardiology later and also put back on ASA 81mg . Currently, pt left sided weakness resolved, walk with walker. BP and glucose controlled well.   Plan:  - continue eliquis, baby ASA and lipitor for stroke and cardiac prevention - check BP and glucose at home - follow up with cardiology regularly  - Follow up with your primary care physician for stroke risk factor modification. Recommend maintain blood pressure goal <130/80, diabetes with hemoglobin A1c goal below 7.0% and lipids with LDL cholesterol goal below 70 mg/dL.  - healthy diet and home exercise - avoid falls, use walker for imbalance - follow up in 6 months   No orders of the defined types were placed in this encounter.   No orders of the defined types were placed in this encounter.   Patient Instructions  - continue eliquis, baby ASA and lipitor for stroke and cardiac prevention - check BP and glucose at home - follow up with cardiology regularly  - Follow up with your primary care physician for stroke risk factor modification. Recommend maintain blood pressure goal <130/80, diabetes  with hemoglobin A1c goal below 7.0% and lipids with LDL cholesterol goal below 70 mg/dL.  - healthy diet and home exercise - avoid falls, use walker for imbalance - follow up in 6 months   Rosalin Hawking, MD PhD [facility] [address] [place], [place] [address] [phone]

## 2016-11-22 DIAGNOSIS — G2581 Restless legs syndrome: Secondary | ICD-10-CM | POA: Diagnosis not present

## 2016-11-22 DIAGNOSIS — I639 Cerebral infarction, unspecified: Secondary | ICD-10-CM | POA: Diagnosis not present

## 2016-11-22 DIAGNOSIS — R202 Paresthesia of skin: Secondary | ICD-10-CM | POA: Diagnosis not present

## 2016-12-13 DIAGNOSIS — G2581 Restless legs syndrome: Secondary | ICD-10-CM | POA: Diagnosis not present

## 2016-12-17 DIAGNOSIS — D126 Benign neoplasm of colon, unspecified: Secondary | ICD-10-CM | POA: Diagnosis not present

## 2017-01-18 DIAGNOSIS — R600 Localized edema: Secondary | ICD-10-CM | POA: Diagnosis not present

## 2017-01-21 DIAGNOSIS — H43391 Other vitreous opacities, right eye: Secondary | ICD-10-CM | POA: Diagnosis not present

## 2017-01-21 DIAGNOSIS — H353221 Exudative age-related macular degeneration, left eye, with active choroidal neovascularization: Secondary | ICD-10-CM | POA: Diagnosis not present

## 2017-01-21 DIAGNOSIS — H43822 Vitreomacular adhesion, left eye: Secondary | ICD-10-CM | POA: Diagnosis not present

## 2017-01-21 DIAGNOSIS — E113311 Type 2 diabetes mellitus with moderate nonproliferative diabetic retinopathy with macular edema, right eye: Secondary | ICD-10-CM | POA: Diagnosis not present

## 2017-01-21 DIAGNOSIS — H353132 Nonexudative age-related macular degeneration, bilateral, intermediate dry stage: Secondary | ICD-10-CM | POA: Diagnosis not present

## 2017-01-28 DIAGNOSIS — H353221 Exudative age-related macular degeneration, left eye, with active choroidal neovascularization: Secondary | ICD-10-CM | POA: Diagnosis not present

## 2017-02-18 DIAGNOSIS — E119 Type 2 diabetes mellitus without complications: Secondary | ICD-10-CM | POA: Diagnosis not present

## 2017-02-18 DIAGNOSIS — R600 Localized edema: Secondary | ICD-10-CM | POA: Diagnosis not present

## 2017-02-18 DIAGNOSIS — E785 Hyperlipidemia, unspecified: Secondary | ICD-10-CM | POA: Diagnosis not present

## 2017-03-04 DIAGNOSIS — H353221 Exudative age-related macular degeneration, left eye, with active choroidal neovascularization: Secondary | ICD-10-CM | POA: Diagnosis not present

## 2017-03-04 DIAGNOSIS — H353132 Nonexudative age-related macular degeneration, bilateral, intermediate dry stage: Secondary | ICD-10-CM | POA: Diagnosis not present

## 2017-03-25 ENCOUNTER — Inpatient Hospital Stay (HOSPITAL_COMMUNITY)
Admission: EM | Admit: 2017-03-25 | Discharge: 2017-04-02 | DRG: 378 | Disposition: A | Discharge status: Home / Self Care | Payer: Medicare Other | Attending: Family Medicine | Admitting: Family Medicine

## 2017-03-25 ENCOUNTER — Encounter (HOSPITAL_COMMUNITY): Payer: Self-pay | Admitting: Emergency Medicine

## 2017-03-25 DIAGNOSIS — Z888 Allergy status to other drugs, medicaments and biological substances status: Secondary | ICD-10-CM

## 2017-03-25 DIAGNOSIS — N183 Chronic kidney disease, stage 3 unspecified: Secondary | ICD-10-CM | POA: Diagnosis present

## 2017-03-25 DIAGNOSIS — K922 Gastrointestinal hemorrhage, unspecified: Secondary | ICD-10-CM

## 2017-03-25 DIAGNOSIS — I6930 Unspecified sequelae of cerebral infarction: Secondary | ICD-10-CM | POA: Diagnosis not present

## 2017-03-25 DIAGNOSIS — D696 Thrombocytopenia, unspecified: Secondary | ICD-10-CM | POA: Diagnosis present

## 2017-03-25 DIAGNOSIS — Z9049 Acquired absence of other specified parts of digestive tract: Secondary | ICD-10-CM

## 2017-03-25 DIAGNOSIS — Z7982 Long term (current) use of aspirin: Secondary | ICD-10-CM

## 2017-03-25 DIAGNOSIS — Q430 Meckel's diverticulum (displaced) (hypertrophic): Secondary | ICD-10-CM

## 2017-03-25 DIAGNOSIS — Z7901 Long term (current) use of anticoagulants: Secondary | ICD-10-CM | POA: Diagnosis not present

## 2017-03-25 DIAGNOSIS — Z96651 Presence of right artificial knee joint: Secondary | ICD-10-CM | POA: Diagnosis present

## 2017-03-25 DIAGNOSIS — I509 Heart failure, unspecified: Secondary | ICD-10-CM | POA: Diagnosis not present

## 2017-03-25 DIAGNOSIS — D62 Acute posthemorrhagic anemia: Secondary | ICD-10-CM | POA: Diagnosis present

## 2017-03-25 DIAGNOSIS — K625 Hemorrhage of anus and rectum: Secondary | ICD-10-CM

## 2017-03-25 DIAGNOSIS — M1990 Unspecified osteoarthritis, unspecified site: Secondary | ICD-10-CM | POA: Diagnosis not present

## 2017-03-25 DIAGNOSIS — M47816 Spondylosis without myelopathy or radiculopathy, lumbar region: Secondary | ICD-10-CM | POA: Diagnosis present

## 2017-03-25 DIAGNOSIS — I25810 Atherosclerosis of coronary artery bypass graft(s) without angina pectoris: Secondary | ICD-10-CM | POA: Diagnosis not present

## 2017-03-25 DIAGNOSIS — I272 Pulmonary hypertension, unspecified: Secondary | ICD-10-CM | POA: Diagnosis present

## 2017-03-25 DIAGNOSIS — I69392 Facial weakness following cerebral infarction: Secondary | ICD-10-CM | POA: Diagnosis not present

## 2017-03-25 DIAGNOSIS — I10 Essential (primary) hypertension: Secondary | ICD-10-CM | POA: Diagnosis not present

## 2017-03-25 DIAGNOSIS — G894 Chronic pain syndrome: Secondary | ICD-10-CM | POA: Diagnosis not present

## 2017-03-25 DIAGNOSIS — R791 Abnormal coagulation profile: Secondary | ICD-10-CM | POA: Diagnosis present

## 2017-03-25 DIAGNOSIS — R1903 Right lower quadrant abdominal swelling, mass and lump: Secondary | ICD-10-CM | POA: Diagnosis present

## 2017-03-25 DIAGNOSIS — I481 Persistent atrial fibrillation: Secondary | ICD-10-CM | POA: Diagnosis not present

## 2017-03-25 DIAGNOSIS — D500 Iron deficiency anemia secondary to blood loss (chronic): Secondary | ICD-10-CM | POA: Diagnosis not present

## 2017-03-25 DIAGNOSIS — Z86010 Personal history of colonic polyps: Secondary | ICD-10-CM

## 2017-03-25 DIAGNOSIS — I480 Paroxysmal atrial fibrillation: Secondary | ICD-10-CM | POA: Diagnosis not present

## 2017-03-25 DIAGNOSIS — E1142 Type 2 diabetes mellitus with diabetic polyneuropathy: Secondary | ICD-10-CM | POA: Diagnosis not present

## 2017-03-25 DIAGNOSIS — Z79891 Long term (current) use of opiate analgesic: Secondary | ICD-10-CM

## 2017-03-25 DIAGNOSIS — Z951 Presence of aortocoronary bypass graft: Secondary | ICD-10-CM

## 2017-03-25 DIAGNOSIS — G8929 Other chronic pain: Secondary | ICD-10-CM | POA: Diagnosis present

## 2017-03-25 DIAGNOSIS — I700 Atherosclerosis of aorta: Secondary | ICD-10-CM | POA: Diagnosis present

## 2017-03-25 DIAGNOSIS — M545 Low back pain: Secondary | ICD-10-CM | POA: Diagnosis present

## 2017-03-25 DIAGNOSIS — K921 Melena: Secondary | ICD-10-CM | POA: Diagnosis not present

## 2017-03-25 DIAGNOSIS — N184 Chronic kidney disease, stage 4 (severe): Secondary | ICD-10-CM | POA: Diagnosis present

## 2017-03-25 DIAGNOSIS — I5042 Chronic combined systolic (congestive) and diastolic (congestive) heart failure: Secondary | ICD-10-CM | POA: Diagnosis present

## 2017-03-25 DIAGNOSIS — I2510 Atherosclerotic heart disease of native coronary artery without angina pectoris: Secondary | ICD-10-CM | POA: Diagnosis present

## 2017-03-25 DIAGNOSIS — E1122 Type 2 diabetes mellitus with diabetic chronic kidney disease: Secondary | ICD-10-CM | POA: Diagnosis not present

## 2017-03-25 DIAGNOSIS — K5731 Diverticulosis of large intestine without perforation or abscess with bleeding: Principal | ICD-10-CM | POA: Diagnosis present

## 2017-03-25 DIAGNOSIS — N179 Acute kidney failure, unspecified: Secondary | ICD-10-CM | POA: Diagnosis not present

## 2017-03-25 DIAGNOSIS — K8020 Calculus of gallbladder without cholecystitis without obstruction: Secondary | ICD-10-CM | POA: Diagnosis present

## 2017-03-25 DIAGNOSIS — I482 Chronic atrial fibrillation: Secondary | ICD-10-CM | POA: Diagnosis not present

## 2017-03-25 DIAGNOSIS — Z825 Family history of asthma and other chronic lower respiratory diseases: Secondary | ICD-10-CM

## 2017-03-25 DIAGNOSIS — R58 Hemorrhage, not elsewhere classified: Secondary | ICD-10-CM | POA: Diagnosis not present

## 2017-03-25 DIAGNOSIS — G8190 Hemiplegia, unspecified affecting unspecified side: Secondary | ICD-10-CM | POA: Diagnosis present

## 2017-03-25 DIAGNOSIS — I255 Ischemic cardiomyopathy: Secondary | ICD-10-CM | POA: Diagnosis not present

## 2017-03-25 DIAGNOSIS — K5730 Diverticulosis of large intestine without perforation or abscess without bleeding: Secondary | ICD-10-CM | POA: Diagnosis not present

## 2017-03-25 DIAGNOSIS — E876 Hypokalemia: Secondary | ICD-10-CM | POA: Diagnosis not present

## 2017-03-25 DIAGNOSIS — E785 Hyperlipidemia, unspecified: Secondary | ICD-10-CM | POA: Diagnosis present

## 2017-03-25 DIAGNOSIS — Z6841 Body Mass Index (BMI) 40.0 and over, adult: Secondary | ICD-10-CM | POA: Diagnosis not present

## 2017-03-25 DIAGNOSIS — Z794 Long term (current) use of insulin: Secondary | ICD-10-CM

## 2017-03-25 DIAGNOSIS — R1900 Intra-abdominal and pelvic swelling, mass and lump, unspecified site: Secondary | ICD-10-CM | POA: Diagnosis not present

## 2017-03-25 DIAGNOSIS — Z955 Presence of coronary angioplasty implant and graft: Secondary | ICD-10-CM

## 2017-03-25 DIAGNOSIS — I130 Hypertensive heart and chronic kidney disease with heart failure and stage 1 through stage 4 chronic kidney disease, or unspecified chronic kidney disease: Secondary | ICD-10-CM | POA: Diagnosis present

## 2017-03-25 DIAGNOSIS — Z882 Allergy status to sulfonamides status: Secondary | ICD-10-CM

## 2017-03-25 DIAGNOSIS — Z79899 Other long term (current) drug therapy: Secondary | ICD-10-CM

## 2017-03-25 DIAGNOSIS — F419 Anxiety disorder, unspecified: Secondary | ICD-10-CM | POA: Diagnosis present

## 2017-03-25 DIAGNOSIS — I4891 Unspecified atrial fibrillation: Secondary | ICD-10-CM | POA: Diagnosis present

## 2017-03-25 HISTORY — DX: Cerebral infarction due to embolism of right middle cerebral artery: I63.411

## 2017-03-25 HISTORY — DX: Morbid (severe) obesity due to excess calories: E66.01

## 2017-03-25 HISTORY — DX: Gastrointestinal hemorrhage, unspecified: K92.2

## 2017-03-25 HISTORY — DX: Generalized anxiety disorder: F41.1

## 2017-03-25 HISTORY — DX: Ischemic cardiomyopathy: I25.5

## 2017-03-25 HISTORY — DX: Heart failure, unspecified: I50.9

## 2017-03-25 HISTORY — DX: Unspecified sequelae of cerebral infarction: I69.30

## 2017-03-25 HISTORY — DX: Thrombocytopenia, unspecified: D69.6

## 2017-03-25 HISTORY — DX: Essential (primary) hypertension: I10

## 2017-03-25 HISTORY — DX: Hemiplegia, unspecified affecting unspecified side: G81.90

## 2017-03-25 HISTORY — DX: Facial weakness following cerebral infarction: I69.392

## 2017-03-25 HISTORY — DX: Chronic pain syndrome: G89.4

## 2017-03-25 HISTORY — DX: Other persistent atrial fibrillation: I48.19

## 2017-03-25 HISTORY — DX: Type 2 diabetes mellitus with diabetic polyneuropathy: E11.42

## 2017-03-25 LAB — CBC
HEMATOCRIT: 24 % — AB (ref 36.0–46.0)
Hemoglobin: 7.5 g/dL — ABNORMAL LOW (ref 12.0–15.0)
MCH: 28.5 pg (ref 26.0–34.0)
MCHC: 31.3 g/dL (ref 30.0–36.0)
MCV: 91.3 fL (ref 78.0–100.0)
PLATELETS: 145 10*3/uL — AB (ref 150–400)
RBC: 2.63 MIL/uL — AB (ref 3.87–5.11)
RDW: 15.3 % (ref 11.5–15.5)
WBC: 5.4 10*3/uL (ref 4.0–10.5)

## 2017-03-25 LAB — COMPREHENSIVE METABOLIC PANEL
ALT: 14 U/L (ref 14–54)
AST: 27 U/L (ref 15–41)
Albumin: 3.3 g/dL — ABNORMAL LOW (ref 3.5–5.0)
Alkaline Phosphatase: 99 U/L (ref 38–126)
Anion gap: 10 (ref 5–15)
BILIRUBIN TOTAL: 0.8 mg/dL (ref 0.3–1.2)
BUN: 36 mg/dL — ABNORMAL HIGH (ref 6–20)
CHLORIDE: 106 mmol/L (ref 101–111)
CO2: 26 mmol/L (ref 22–32)
Calcium: 8.7 mg/dL — ABNORMAL LOW (ref 8.9–10.3)
Creatinine, Ser: 1.31 mg/dL — ABNORMAL HIGH (ref 0.44–1.00)
GFR, EST AFRICAN AMERICAN: 44 mL/min — AB (ref >60)
GFR, EST NON AFRICAN AMERICAN: 38 mL/min — AB (ref >60)
Glucose, Bld: 250 mg/dL — ABNORMAL HIGH (ref 65–99)
POTASSIUM: 3.9 mmol/L (ref 3.5–5.1)
Sodium: 142 mmol/L (ref 135–145)
TOTAL PROTEIN: 6.4 g/dL — AB (ref 6.5–8.1)

## 2017-03-25 LAB — POC OCCULT BLOOD, ED: Fecal Occult Bld: POSITIVE — AB

## 2017-03-25 LAB — APTT: APTT: 37 s — AB (ref 24–36)

## 2017-03-25 LAB — PROTIME-INR
INR: 1.64
Prothrombin Time: 19.6 seconds — ABNORMAL HIGH (ref 11.4–15.2)

## 2017-03-25 LAB — BRAIN NATRIURETIC PEPTIDE: B NATRIURETIC PEPTIDE 5: 149.9 pg/mL — AB (ref 0.0–100.0)

## 2017-03-25 LAB — PREPARE RBC (CROSSMATCH)

## 2017-03-25 LAB — CBG MONITORING, ED: Glucose-Capillary: 170 mg/dL — ABNORMAL HIGH (ref 65–99)

## 2017-03-25 MED ORDER — ACETAMINOPHEN 325 MG PO TABS
650.0000 mg | ORAL_TABLET | Freq: Four times a day (QID) | ORAL | Status: DC | PRN
  Administered 2017-03-27 – 2017-03-30 (×3): 650 mg via ORAL
  Filled 2017-03-25 (×3): qty 2

## 2017-03-25 MED ORDER — CARVEDILOL 6.25 MG PO TABS
6.2500 mg | ORAL_TABLET | Freq: Two times a day (BID) | ORAL | Status: DC
  Administered 2017-03-26 – 2017-04-02 (×13): 6.25 mg via ORAL
  Filled 2017-03-25 (×13): qty 1

## 2017-03-25 MED ORDER — ROPINIROLE HCL 1 MG PO TABS
1.0000 mg | ORAL_TABLET | Freq: Every day | ORAL | Status: DC
  Administered 2017-03-26 – 2017-04-01 (×7): 1 mg via ORAL
  Filled 2017-03-25 (×10): qty 1

## 2017-03-25 MED ORDER — ATORVASTATIN CALCIUM 40 MG PO TABS
40.0000 mg | ORAL_TABLET | Freq: Every day | ORAL | Status: DC
  Administered 2017-03-26 – 2017-04-01 (×7): 40 mg via ORAL
  Filled 2017-03-25 (×8): qty 1

## 2017-03-25 MED ORDER — ACETAMINOPHEN 650 MG RE SUPP: 650.0000 mg | Freq: Four times a day (QID) | RECTAL | Status: DC | PRN

## 2017-03-25 MED ORDER — INSULIN GLARGINE 100 UNIT/ML ~~LOC~~ SOLN
8.0000 [IU] | Freq: Every day | SUBCUTANEOUS | Status: DC
  Administered 2017-03-26 – 2017-04-01 (×8): 8 [IU] via SUBCUTANEOUS
  Filled 2017-03-25 (×10): qty 0.08

## 2017-03-25 MED ORDER — INSULIN ASPART 100 UNIT/ML ~~LOC~~ SOLN
0.0000 [IU] | SUBCUTANEOUS | Status: DC
  Administered 2017-03-25: 2 [IU] via SUBCUTANEOUS
  Administered 2017-03-26 – 2017-03-28 (×4): 1 [IU] via SUBCUTANEOUS
  Administered 2017-03-29: 3 [IU] via SUBCUTANEOUS
  Administered 2017-03-29: 1 [IU] via SUBCUTANEOUS
  Administered 2017-03-30: 3 [IU] via SUBCUTANEOUS
  Administered 2017-03-30: 2 [IU] via SUBCUTANEOUS
  Administered 2017-03-31: 1 [IU] via SUBCUTANEOUS
  Administered 2017-03-31: 2 [IU] via SUBCUTANEOUS
  Administered 2017-03-31: 3 [IU] via SUBCUTANEOUS
  Administered 2017-04-01: 2 [IU] via SUBCUTANEOUS
  Administered 2017-04-01: 3 [IU] via SUBCUTANEOUS
  Filled 2017-03-25 (×3): qty 1

## 2017-03-25 MED ORDER — GABAPENTIN 400 MG PO CAPS
400.0000 mg | ORAL_CAPSULE | Freq: Two times a day (BID) | ORAL | Status: DC
  Administered 2017-03-25 – 2017-04-02 (×15): 400 mg via ORAL
  Filled 2017-03-25 (×16): qty 1

## 2017-03-25 MED ORDER — POTASSIUM CHLORIDE CRYS ER 20 MEQ PO TBCR
20.0000 meq | EXTENDED_RELEASE_TABLET | Freq: Every day | ORAL | Status: DC
  Administered 2017-03-26 – 2017-04-02 (×7): 20 meq via ORAL
  Filled 2017-03-25 (×8): qty 1

## 2017-03-25 MED ORDER — SODIUM CHLORIDE 0.9 % IV SOLN
10.0000 mL/h | Freq: Once | INTRAVENOUS | Status: AC
  Administered 2017-03-25: 10 mL/h via INTRAVENOUS

## 2017-03-25 MED ORDER — VITAMIN B-12 1000 MCG PO TABS
1000.0000 ug | ORAL_TABLET | Freq: Every day | ORAL | Status: DC
  Administered 2017-03-27 – 2017-04-02 (×7): 1000 ug via ORAL
  Filled 2017-03-25 (×8): qty 1

## 2017-03-25 MED ORDER — SODIUM CHLORIDE 0.9% FLUSH
3.0000 mL | Freq: Two times a day (BID) | INTRAVENOUS | Status: DC
  Administered 2017-03-25 – 2017-04-01 (×10): 3 mL via INTRAVENOUS

## 2017-03-25 NOTE — ED Provider Notes (Signed)
[place] DEPT Provider Note   CSN: [id number] Arrival date & time: 03/25/17  1530     History   Chief Complaint Chief Complaint  Patient presents with  . Rectal Bleeding    HPI [name] EDELEN is a 76 y.o. female.  HPI Patient presents to the emergency room for evaluation of rectal bleeding. Patient does not have any history of GI bleed. She is chronically on Eliquis for A fib. She did take a dose this morning. Patient did have a stroke last year. Patient states she started noticing blood in her stools on Friday. Her episode or weekend and then another one today. She started to feel lightheaded and weak. Family thinks she also looks a little bit pale than usual. Patient denies any trouble with any chest pain. No fevers or chills. No abdominal pain. Past Medical History:  Diagnosis Date  . Arthritis    "qwhere" (03/30/2016)  . Atrial fibrillation ([place])    Postoperative in 2009;  S/P transesophageal echocardiography-guided cardioversion, previously on Amiodarone and Coumadin; recurrent in April 2013  . CAD (coronary artery disease) of artery bypass graft 05/2008   S/P CABG in 2009  . CHF (congestive heart failure) ([place])   . Chronic anticoagulation   . Chronic kidney disease (CKD), stage III (moderate)   . Chronic lower back pain   . Heart murmur   . Hyperlipidemia   . Hypertension   . Ischemic cardiomyopathy   . Obesities, morbid ([place])   . Stroke ([place])   . Systolic heart failure   . TIA (transient ischemic attack) 03/30/2016  . Type II diabetes mellitus [facility])     Patient Active Problem List   Diagnosis Date Noted  . Chronic bilateral low back pain without sciatica 04/26/2016  . Right middle cerebral artery stroke ([place]) 04/04/2016  . Facial weakness, post-stroke   . Hemiparesis ([place])   . Persistent atrial fibrillation ([place])   . Chronic pain syndrome   . HLD (hyperlipidemia)   . Chronic diastolic congestive heart failure ([place])   . Coronary artery disease involving  coronary bypass graft of native heart without angina pectoris   . CKD (chronic kidney disease)   . E-coli UTI   . Chronic combined systolic and diastolic congestive heart failure ([place])   . Benign essential HTN   . DM type 2 with diabetic peripheral neuropathy ([place])   . Tachypnea   . Thrombocytopenia ([place])   . UTI (urinary tract infection) 04/01/2016  . Acute CVA (cerebrovascular accident) ([place]) 03/31/2016  . Acute hemorrhagic infarction of brain ([place])   . Cerebrovascular accident (CVA) due to embolism of right middle cerebral artery ([place])   . Subtherapeutic international normalized ratio (INR)   . CVA (cerebral vascular accident) ([place]) 03/30/2016  . CKD (chronic kidney disease) stage 3, GFR 30-59 ml/min 03/30/2016  . Chronic anticoagulation 03/30/2016  . Diabetes ([place]) 01/31/2009  . Hyperlipidemia 01/31/2009  . ANXIETY 01/31/2009  . Coronary atherosclerosis 01/31/2009  . CAD, AUTOLOGOUS BYPASS GRAFT 01/31/2009  . CARDIOMYOPATHY, ISCHEMIC 01/31/2009  . ATRIAL FIBRILLATION 01/31/2009  . SYSTOLIC HEART FAILURE, CHRONIC 01/31/2009  . TACHYCARDIA, HX OF 01/31/2009    Past Surgical History:  Procedure Laterality Date  . APPENDECTOMY  1946  . CATARACT EXTRACTION Right 2012  . CORONARY ANGIOPLASTY WITH STENT PLACEMENT  2009   "put 2 stents in"  . HERNIA REPAIR    . JOINT REPLACEMENT    . TEE WITH CARDIOVERSION     Postoperative in 2009;  S/P transesophageal echocardiography-guided cardioversion,  .  TOTAL KNEE ARTHROPLASTY Right 1994  . VENTRAL HERNIA REPAIR  10/1999   With mesh    OB History    No data available       Home Medications    Prior to Admission medications   Medication Sig Start Date End Date Taking? Authorizing Provider  apixaban (ELIQUIS) 5 MG TABS tablet Take 1 tablet (5 mg total) by mouth 2 (two) times daily. 04/26/16   Kirsteins, Luanna Salk, MD  aspirin EC 81 MG tablet Take 1 tablet (81 mg total) by mouth daily. 08/27/16   Burnell Blanks, MD    atorvastatin (LIPITOR) 40 MG tablet Take 1 tablet (40 mg total) by mouth daily at 6 PM. 04/12/16   Angiulli, Lavon Paganini, PA-C  carvedilol (COREG) 6.25 MG tablet Take 1 tablet (6.25 mg total) by mouth 2 (two) times daily with a meal. 04/12/16   Angiulli, Lavon Paganini, PA-C  cholecalciferol (VITAMIN D) 1000 UNITS tablet Take 1,000 Units by mouth 2 (two) times daily.    [provider]  Cyanocobalamin (RA VITAMIN B-12 TR) 1000 MCG TBCR Take by mouth.    [provider]  docusate sodium (COLACE) 100 MG capsule Take 100 mg by mouth 2 (two) times daily.     [provider]  furosemide (LASIX) 40 MG tablet Take 1 tablet (40 mg total) by mouth daily. 04/12/16   Angiulli, Lavon Paganini, PA-C  gabapentin (NEURONTIN) 400 MG capsule Take 1 capsule (400 mg total) by mouth at bedtime. 04/12/16   Angiulli, Lavon Paganini, PA-C  insulin glargine (LANTUS) 100 UNIT/ML injection Inject 0.18 mLs (18 Units total) into the skin at bedtime. Patient taking differently: Inject 23 Units into the skin at bedtime.  04/12/16   Angiulli, Lavon Paganini, PA-C  nitroGLYCERIN (NITROSTAT) 0.4 MG SL tablet Place 0.4 mg under the tongue every 5 (five) minutes as needed. May repeat for up to 3 doses.     [provider]  potassium chloride SA (K-DUR,KLOR-CON) 20 MEQ tablet Take 1 tablet (20 mEq total) by mouth daily. 04/12/16   Angiulli, Lavon Paganini, PA-C  rOPINIRole (REQUIP) 0.25 MG tablet Take 1 mg by mouth.    [provider]  traMADol (ULTRAM) 50 MG tablet Take 50-100 mg by mouth 2 (two) times daily. Take 2 tablets every morning and take 1 tablet at night    [provider]    Family History Family History  Problem Relation Age of Onset  . Clotting disorder Mother        Cerebral hemorrhage  . Emphysema Father        COD  . Lung cancer Brother   . Heart disease Neg Hx     Social History Social History  Substance Use Topics  . Smoking status: Never Smoker  . Smokeless tobacco: Never Used  . Alcohol  use No     Allergies   Benazepril; Fe-succ-c-thre-b12-des stomach; Spironolactone; and Sulfamethoxazole-trimethoprim   Review of Systems Review of Systems  All other systems reviewed and are negative.    Physical Exam Updated Vital Signs BP (!) 129/59   Pulse 74   Temp 98.5 F (36.9 C) (Oral)   Resp 13   SpO2 100%   Physical Exam  Constitutional: No distress.  Obese  HENT:  Head: Normocephalic and atraumatic.  Right Ear: External ear normal.  Left Ear: External ear normal.  Eyes: Conjunctivae are normal. Right eye exhibits no discharge. Left eye exhibits no discharge. No scleral icterus.  Neck: Neck supple. No  tracheal deviation present.  Cardiovascular: Normal rate, regular rhythm and intact distal pulses.   Pulmonary/Chest: Effort normal and breath sounds normal. No stridor. No respiratory distress. She has no wheezes. She has no rales.  Abdominal: Soft. Bowel sounds are normal. She exhibits no distension. There is no tenderness. There is no rebound and no guarding.  Musculoskeletal: She exhibits edema (pitting edema bilateral lower extremities). She exhibits no tenderness.  Neurological: She is alert. She has normal strength. No cranial nerve deficit (no facial droop, extraocular movements intact, no slurred speech) or sensory deficit. She exhibits normal muscle tone. She displays no seizure activity. Coordination normal.  Skin: Skin is warm and dry. No rash noted. There is pallor.  Psychiatric: She has a normal mood and affect.  Nursing note and vitals reviewed.    ED Treatments / Results  Labs (all labs ordered are listed, but only abnormal results are displayed) Labs Reviewed  COMPREHENSIVE METABOLIC PANEL - Abnormal; Notable for the following:       Result Value   Glucose, Bld 250 (*)    BUN 36 (*)    Creatinine, Ser 1.31 (*)    Calcium 8.7 (*)    Total Protein 6.4 (*)    Albumin 3.3 (*)    GFR calc non Af Amer 38 (*)    GFR calc Af Amer 44 (*)    All  other components within normal limits  CBC - Abnormal; Notable for the following:    RBC 2.63 (*)    Hemoglobin 7.5 (*)    HCT 24.0 (*)    Platelets 145 (*)    All other components within normal limits  POC OCCULT BLOOD, ED - Abnormal; Notable for the following:    Fecal Occult Bld POSITIVE (*)    All other components within normal limits  PROTIME-INR  APTT  TYPE AND SCREEN  PREPARE RBC (CROSSMATCH)      Procedures .Critical Care Performed by: Dorie Rank Authorized by: Dorie Rank   Critical care provider statement:    Critical care time (minutes):  30   Critical care was time spent personally by me on the following activities:  Discussions with consultants, evaluation of patient's response to treatment, examination of patient, ordering and performing treatments and interventions, ordering and review of laboratory studies, ordering and review of radiographic studies, pulse oximetry, re-evaluation of patient's condition, obtaining history from patient or surrogate and review of old charts   (including critical care time)  Medications Ordered in ED Medications  0.9 %  sodium chloride infusion (not administered)     Initial Impression / Assessment and Plan / ED Course  I have reviewed the triage vital signs and the nursing notes.  Pertinent labs & imaging results that were available during my care of the patient were reviewed by me and considered in my medical decision making (see chart for details).  Clinical Course as of Mar 25 1930  Mon Mar 25, 2017  1906 Patient presents to the emergency room with complaints of rectal bleeding. Laboratory tests are notable for hemoglobin of 7.5. This significantly decreased from previous values. She is symptomatic. On rectal exam she has maroon stools. Patient denies any chest pain or shortness of breath. She appears hemodynamically stable.  [JK]    Clinical Course User Index [JK] Dorie Rank, MD    I have ordered blood transfusions for  her symptomatic anemia and active GI bleeding. We will continue to monitor her closely.  I spoke with the family medicine  service will admit the patient for further treatment. Consult with GI on-call.  Discussed with Dr Kalman Shan  Final Clinical Impressions(s) / ED Diagnoses   Final diagnoses:  Rectal bleeding  Acute blood loss anemia      Dorie Rank, MD 03/25/17 1931

## 2017-03-25 NOTE — H&P (Signed)
[facility] Admission History and Physical Service Pager: [phone]  Patient name: [name] Medical record number: [id number] Date of birth: Mar 24, 1941 Age: 76 y.o. Gender: female  Primary Care Provider: Braulio Conte, FNP Consultants: GI Code Status: DNI  Chief Complaint: Bloody bowel movement  Assessment and Plan: [name] is a 76 y.o. female presenting with bloody bowel movements x3 and on Eliquis, last taken 5/14 am. PMH is significant for CAD with CABG 2009, ischemic cardiomyopathy, atrial fibrillation on Eliquis, systolic CHF (last echo 07/3569 EF 35-40% with diffuse hypokinesis and pulm HTN), HLD, DM, CKD, CVA.   Anemia: Concern for acute lower GI bleed given frank blood on BM this morning and ED providers examination. Patient on Eliquis due to A.fib with alst dose this AM. Hbg on admit 7.5; baseline appears to be ~12. Transfuse 2 units per ED note. GI consulted. Per patient last had colonoscopy in 2016 which had multiple polyps (unable to evaluate this as no records in chart). Anticipate GI will weigh need for emergent colonscopy with degree of persistent bleeding..   -admit to stepdown, Dr. McDiarmid attending -GI consulted, appreciate recs -CBCs q6H  -hold Eliquis in the setting of acute bleed -reassess fluid status after 2U, may need IV lasix -IV protonix -type and screen -vitals per protocol  A.fib: on Eliquis at home, last anticoagulation 5/14 am. Hold any anticoagulation given acute GI. -hold eliquis -restart after GI intervention or stabilization of bleeding -continue coreg for rate control  HFpEF: last echo 03/2016 with EF 35-40%. Follows with Dr. Julianne Handler as below. Last OV 08/2016 - meds were coreg, ASA, statin. Appears mildly fluid overloaded on exam. 2+ pitting edema to the knee bilaterally, but patient reports this is baseline. No signs of fluid overload on lung exam.  -hold PO lasix as patient may need IV diuresis after  2U and cautious with BP -BNP -consider IV lasix dose after 2U PRBC as above  Diabetes: patient reports taking 23U Lantus QHS at home.  -hemoglobin A1c -sensitive sliding scale while NPO -Lantus 10U QHS  HTN: patient takes coreg, lasix at home.  -continue coreg  CAD/HLD: CABG 2009, ischemic cardiomyopathy. Patient follows with Dr. Angelena Form outpatient.  -no ARB as an outpatient due to h/o hypotension -continue lipitor  CKD3: follows with [facility] Dr. Audie Clear. Baseline in our system appears to be between 1.18 and 1.7.  -monitor on daily BMP -avoid nephrotoxic agents  FEN/GI: NPO, IV PPI  Prophylaxis: none given acute bleeding   Disposition: admit to stepdown  History of Present Illness:  [name] is a 76 y.o. female presenting with GI bleed, first starting Friday. Notably, she is on Eliquis at home for afib and last took this this morning. She noticed bleeding with bowel movement that started on Friday. Whole toilet bowl was red. Additional bloody BM x 1 on Saturday. No bleeding on Sunday. One bloody BM today. Has been feeling fatigued, dyspneic, more pale than usual. Denies abdominal pain, fevers. Denies chest pain, dysuria, N/V. Last colonoscopy 3 years ago. Was supposed to get another but had a stroke and put on eliquis so shared decision to not get further colonoscopy. They found 9 polyps on last colonoscopy. No history of Ulcer or GI bleed.   Son is with patient. She is independent and takes care of her own finances, but they live together.   Review Of Systems: Per HPI with the following additions:   Review of Systems  Constitutional: Positive for malaise/fatigue.  Negative for fever.  Respiratory: Positive for shortness of breath. Negative for cough and wheezing.   Cardiovascular: Negative for chest pain, palpitations and PND.  Gastrointestinal: Positive for blood in stool. Negative for abdominal pain, nausea and vomiting.  Genitourinary: Negative for dysuria.     Patient Active Problem List   Diagnosis Date Noted  . Chronic bilateral low back pain without sciatica 04/26/2016  . Right middle cerebral artery stroke ([place]) 04/04/2016  . Facial weakness, post-stroke   . Hemiparesis ([place])   . Persistent atrial fibrillation ([place])   . Chronic pain syndrome   . HLD (hyperlipidemia)   . Chronic diastolic congestive heart failure ([place])   . Coronary artery disease involving coronary bypass graft of native heart without angina pectoris   . CKD (chronic kidney disease)   . E-coli UTI   . Chronic combined systolic and diastolic congestive heart failure ([place])   . Benign essential HTN   . DM type 2 with diabetic peripheral neuropathy ([place])   . Tachypnea   . Thrombocytopenia ([place])   . UTI (urinary tract infection) 04/01/2016  . Acute CVA (cerebrovascular accident) ([place]) 03/31/2016  . Acute hemorrhagic infarction of brain ([place])   . Cerebrovascular accident (CVA) due to embolism of right middle cerebral artery ([place])   . Subtherapeutic international normalized ratio (INR)   . CVA (cerebral vascular accident) ([place]) 03/30/2016  . CKD (chronic kidney disease) stage 3, GFR 30-59 ml/min 03/30/2016  . Chronic anticoagulation 03/30/2016  . Diabetes ([place]) 01/31/2009  . Hyperlipidemia 01/31/2009  . ANXIETY 01/31/2009  . Coronary atherosclerosis 01/31/2009  . CAD, AUTOLOGOUS BYPASS GRAFT 01/31/2009  . CARDIOMYOPATHY, ISCHEMIC 01/31/2009  . ATRIAL FIBRILLATION 01/31/2009  . SYSTOLIC HEART FAILURE, CHRONIC 01/31/2009  . TACHYCARDIA, HX OF 01/31/2009    Past Medical History: Past Medical History:  Diagnosis Date  . Arthritis    "qwhere" (03/30/2016)  . Atrial fibrillation ([place])    Postoperative in 2009;  S/P transesophageal echocardiography-guided cardioversion, previously on Amiodarone and Coumadin; recurrent in April 2013  . CAD (coronary artery disease) of artery bypass graft 05/2008   S/P CABG in 2009  . CHF (congestive heart failure) ([place])   . Chronic  anticoagulation   . Chronic kidney disease (CKD), stage III (moderate)   . Chronic lower back pain   . Heart murmur   . Hyperlipidemia   . Hypertension   . Ischemic cardiomyopathy   . Obesities, morbid ([place])   . Stroke ([place])   . Systolic heart failure   . TIA (transient ischemic attack) 03/30/2016  . Type II diabetes mellitus ([place])     Past Surgical History: Past Surgical History:  Procedure Laterality Date  . APPENDECTOMY  1946  . CATARACT EXTRACTION Right 2012  . CORONARY ANGIOPLASTY WITH STENT PLACEMENT  2009   "put 2 stents in"  . HERNIA REPAIR    . JOINT REPLACEMENT    . TEE WITH CARDIOVERSION     Postoperative in 2009;  S/P transesophageal echocardiography-guided cardioversion,  . TOTAL KNEE ARTHROPLASTY Right 1994  . VENTRAL HERNIA REPAIR  10/1999   With mesh    Social History: Social History  Substance Use Topics  . Smoking status: Never Smoker  . Smokeless tobacco: Never Used  . Alcohol use No   Additional social history: Lives with son; independent  Please also refer to relevant sections of EMR.  Family History: Family History  Problem Relation Age of Onset  . Clotting disorder Mother        Cerebral hemorrhage  .  Emphysema Father        COD  . Lung cancer Brother   . Heart disease Neg Hx     Allergies and Medications: Allergies  Allergen Reactions  . Benazepril Other (See Comments)  . Fe-Succ-C-Thre-B12-Des Stomach Other (See Comments)    Pt doesn't remember   . Spironolactone Other (See Comments)    Dizziness   . Sulfamethoxazole-Trimethoprim Other (See Comments)    Pt doesn't remember   No current facility-administered medications on file prior to encounter.    Current Outpatient Prescriptions on File Prior to Encounter  Medication Sig Dispense Refill  . apixaban (ELIQUIS) 5 MG TABS tablet Take 1 tablet (5 mg total) by mouth 2 (two) times daily. 60 tablet 11  . aspirin EC 81 MG tablet Take 1 tablet (81 mg total) by mouth daily. 90 tablet  3  . atorvastatin (LIPITOR) 40 MG tablet Take 1 tablet (40 mg total) by mouth daily at 6 PM. 30 tablet 0  . carvedilol (COREG) 6.25 MG tablet Take 1 tablet (6.25 mg total) by mouth 2 (two) times daily with a meal. 60 tablet 6  . cholecalciferol (VITAMIN D) 1000 UNITS tablet Take 1,000 Units by mouth 2 (two) times daily.    . Cyanocobalamin (RA VITAMIN B-12 TR) 1000 MCG TBCR Take by mouth.    . docusate sodium (COLACE) 100 MG capsule Take 100 mg by mouth 2 (two) times daily.     . furosemide (LASIX) 40 MG tablet Take 1 tablet (40 mg total) by mouth daily. 30 tablet 0  . gabapentin (NEURONTIN) 400 MG capsule Take 1 capsule (400 mg total) by mouth at bedtime. 30 capsule 1  . insulin glargine (LANTUS) 100 UNIT/ML injection Inject 0.18 mLs (18 Units total) into the skin at bedtime. (Patient taking differently: Inject 23 Units into the skin at bedtime. ) 10 mL 11  . nitroGLYCERIN (NITROSTAT) 0.4 MG SL tablet Place 0.4 mg under the tongue every 5 (five) minutes as needed. May repeat for up to 3 doses.     . potassium chloride SA (K-DUR,KLOR-CON) 20 MEQ tablet Take 1 tablet (20 mEq total) by mouth daily. 30 tablet 1  . rOPINIRole (REQUIP) 0.25 MG tablet Take 1 mg by mouth.    . traMADol (ULTRAM) 50 MG tablet Take 50-100 mg by mouth 2 (two) times daily. Take 2 tablets every morning and take 1 tablet at night      Objective: BP (!) 133/54 (BP Location: Right Arm)   Pulse 83   Temp 98.5 F (36.9 C) (Oral)   Resp 16   SpO2 100%  Exam: General: Pale, obese female resting in bed in NAD.  Eyes: PEERLA, EOMI ENTM: MMM, poor dentition, o/p clear Neck: supple, no JVD Cardiovascular: irregular rhythm, rate normal, no murmur, intact pulses Respiratory: CTAB, easy WOB Gastrointestinal: SNTND, +BS MSK: 2+ pitting edema to knees bilaterally Derm: no rashes or wounds on exposed skin Neuro: CN II-XII grossly intact, alert Psych: mood and affect appropriate   Labs and Imaging: Results for orders placed or  performed during the hospital encounter of 03/25/17 (from the past 24 hour(s))  Comprehensive metabolic panel     Status: Abnormal   Collection Time: 03/25/17  4:20 PM  Result Value Ref Range   Sodium 142 135 - 145 mmol/L   Potassium 3.9 3.5 - 5.1 mmol/L   Chloride 106 101 - 111 mmol/L   CO2 26 22 - 32 mmol/L   Glucose, Bld 250 (H) 65 - 99 mg/dL  BUN 36 (H) 6 - 20 mg/dL   Creatinine, Ser 1.31 (H) 0.44 - 1.00 mg/dL   Calcium 8.7 (L) 8.9 - 10.3 mg/dL   Total Protein 6.4 (L) 6.5 - 8.1 g/dL   Albumin 3.3 (L) 3.5 - 5.0 g/dL   AST 27 15 - 41 U/L   ALT 14 14 - 54 U/L   Alkaline Phosphatase 99 38 - 126 U/L   Total Bilirubin 0.8 0.3 - 1.2 mg/dL   GFR calc non Af Amer 38 (L) >60 mL/min   GFR calc Af Amer 44 (L) >60 mL/min   Anion gap 10 5 - 15  CBC     Status: Abnormal   Collection Time: 03/25/17  4:20 PM  Result Value Ref Range   WBC 5.4 4.0 - 10.5 K/uL   RBC 2.63 (L) 3.87 - 5.11 MIL/uL   Hemoglobin 7.5 (L) 12.0 - 15.0 g/dL   HCT 24.0 (L) 36.0 - 46.0 %   MCV 91.3 78.0 - 100.0 fL   MCH 28.5 26.0 - 34.0 pg   MCHC 31.3 30.0 - 36.0 g/dL   RDW 15.3 11.5 - 15.5 %   Platelets 145 (L) 150 - 400 K/uL  Type and screen [place]     Status: None (Preliminary result)   Collection Time: 03/25/17  4:20 PM  Result Value Ref Range   ABO/RH(D) O POS    Antibody Screen NEG    Sample Expiration 03/28/2017    Unit Number [id number]    Blood Component Type RED CELLS,LR    Unit division 00    Status of Unit ALLOCATED    Transfusion Status OK TO TRANSFUSE    Crossmatch Result Compatible    Unit Number [id number]    Blood Component Type RED CELLS,LR    Unit division 00    Status of Unit ALLOCATED    Transfusion Status OK TO TRANSFUSE    Crossmatch Result Compatible   Prepare RBC     Status: None   Collection Time: 03/25/17  7:04 PM  Result Value Ref Range   Order Confirmation ORDER PROCESSED BY BLOOD BANK   POC occult blood, ED     Status: Abnormal   Collection Time:  03/25/17  7:21 PM  Result Value Ref Range   Fecal Occult Bld POSITIVE (A) NEGATIVE    No results found.   Sela Hilding, MD 03/25/2017, 7:09 PM PGY-1, [place] Intern pager: [phone], text pages welcome   FPTS Upper-Level Resident Addendum  I have independently interviewed and examined the patient. I have discussed the above with the original author and agree with their documentation. My edits for correction/addition/clarification are in pink. Please see also any attending notes.   Katheren Shams, DO PGY-3, [place] Service pager: [phone] (text pages welcome through [facility])

## 2017-03-25 NOTE — ED Triage Notes (Signed)
Pt reports two bloody stools since Friday, states she is on eliquis, no hx of GI bleeds before. Pt pale in triage. A/ox4, resp e/u.

## 2017-03-25 NOTE — ED Notes (Signed)
Attempted IV x2. 

## 2017-03-26 ENCOUNTER — Encounter (HOSPITAL_COMMUNITY): Payer: Self-pay | Admitting: Family Medicine

## 2017-03-26 DIAGNOSIS — I272 Pulmonary hypertension, unspecified: Secondary | ICD-10-CM

## 2017-03-26 DIAGNOSIS — I10 Essential (primary) hypertension: Secondary | ICD-10-CM

## 2017-03-26 DIAGNOSIS — I482 Chronic atrial fibrillation: Secondary | ICD-10-CM

## 2017-03-26 DIAGNOSIS — I509 Heart failure, unspecified: Secondary | ICD-10-CM

## 2017-03-26 DIAGNOSIS — I255 Ischemic cardiomyopathy: Secondary | ICD-10-CM

## 2017-03-26 DIAGNOSIS — I481 Persistent atrial fibrillation: Secondary | ICD-10-CM

## 2017-03-26 DIAGNOSIS — E785 Hyperlipidemia, unspecified: Secondary | ICD-10-CM

## 2017-03-26 DIAGNOSIS — I6930 Unspecified sequelae of cerebral infarction: Secondary | ICD-10-CM

## 2017-03-26 DIAGNOSIS — K5731 Diverticulosis of large intestine without perforation or abscess with bleeding: Secondary | ICD-10-CM | POA: Insufficient documentation

## 2017-03-26 DIAGNOSIS — D696 Thrombocytopenia, unspecified: Secondary | ICD-10-CM

## 2017-03-26 DIAGNOSIS — K625 Hemorrhage of anus and rectum: Secondary | ICD-10-CM

## 2017-03-26 DIAGNOSIS — I25810 Atherosclerosis of coronary artery bypass graft(s) without angina pectoris: Secondary | ICD-10-CM

## 2017-03-26 DIAGNOSIS — K922 Gastrointestinal hemorrhage, unspecified: Secondary | ICD-10-CM | POA: Insufficient documentation

## 2017-03-26 DIAGNOSIS — R58 Hemorrhage, not elsewhere classified: Secondary | ICD-10-CM | POA: Diagnosis present

## 2017-03-26 HISTORY — DX: Morbid (severe) obesity due to excess calories: E66.01

## 2017-03-26 HISTORY — DX: Gastrointestinal hemorrhage, unspecified: K92.2

## 2017-03-26 HISTORY — DX: Unspecified sequelae of cerebral infarction: I69.30

## 2017-03-26 LAB — CBC
HCT: 27.4 % — ABNORMAL LOW (ref 36.0–46.0)
HEMATOCRIT: 28.3 % — AB (ref 36.0–46.0)
HEMOGLOBIN: 8.9 g/dL — AB (ref 12.0–15.0)
Hemoglobin: 9.1 g/dL — ABNORMAL LOW (ref 12.0–15.0)
MCH: 29.3 pg (ref 26.0–34.0)
MCH: 28.9 pg (ref 26.0–34.0)
MCHC: 32.5 g/dL (ref 30.0–36.0)
MCHC: 32.2 g/dL (ref 30.0–36.0)
MCV: 90.1 fL (ref 78.0–100.0)
MCV: 89.8 fL (ref 78.0–100.0)
PLATELETS: 125 10*3/uL — AB (ref 150–400)
PLATELETS: 121 10*3/uL — AB (ref 150–400)
RBC: 3.04 MIL/uL — AB (ref 3.87–5.11)
RBC: 3.15 MIL/uL — ABNORMAL LOW (ref 3.87–5.11)
RDW: 15.5 % (ref 11.5–15.5)
RDW: 15.6 % — AB (ref 11.5–15.5)
WBC: 5.7 10*3/uL (ref 4.0–10.5)
WBC: 6.6 10*3/uL (ref 4.0–10.5)

## 2017-03-26 LAB — PROTIME-INR
INR: 1.55
PROTHROMBIN TIME: 18.7 s — AB (ref 11.4–15.2)

## 2017-03-26 LAB — CBG MONITORING, ED
GLUCOSE-CAPILLARY: 121 mg/dL — AB (ref 65–99)
GLUCOSE-CAPILLARY: 101 mg/dL — AB (ref 65–99)
GLUCOSE-CAPILLARY: 113 mg/dL — AB (ref 65–99)
Glucose-Capillary: 130 mg/dL — ABNORMAL HIGH (ref 65–99)

## 2017-03-26 LAB — BASIC METABOLIC PANEL
ANION GAP: 8 (ref 5–15)
BUN: 31 mg/dL — ABNORMAL HIGH (ref 6–20)
CHLORIDE: 107 mmol/L (ref 101–111)
CO2: 26 mmol/L (ref 22–32)
CREATININE: 1.07 mg/dL — AB (ref 0.44–1.00)
Calcium: 8.6 mg/dL — ABNORMAL LOW (ref 8.9–10.3)
GFR calc non Af Amer: 48 mL/min — ABNORMAL LOW (ref >60)
GFR, EST AFRICAN AMERICAN: 56 mL/min — AB (ref >60)
Glucose, Bld: 116 mg/dL — ABNORMAL HIGH (ref 65–99)
Potassium: 3.7 mmol/L (ref 3.5–5.1)
SODIUM: 141 mmol/L (ref 135–145)

## 2017-03-26 LAB — MRSA PCR SCREENING: MRSA BY PCR: NEGATIVE

## 2017-03-26 LAB — GLUCOSE, CAPILLARY
GLUCOSE-CAPILLARY: 129 mg/dL — AB (ref 65–99)
Glucose-Capillary: 116 mg/dL — ABNORMAL HIGH (ref 65–99)

## 2017-03-26 MED ORDER — FUROSEMIDE 10 MG/ML IJ SOLN
INTRAMUSCULAR | Status: AC
  Filled 2017-03-26: qty 2

## 2017-03-26 MED ORDER — FUROSEMIDE 10 MG/ML IJ SOLN
20.0000 mg | Freq: Once | INTRAMUSCULAR | Status: AC
  Administered 2017-03-26: 20 mg via INTRAVENOUS

## 2017-03-26 MED ORDER — SODIUM CHLORIDE 0.9 % IV SOLN: INTRAVENOUS | Status: DC

## 2017-03-26 MED ORDER — SODIUM CHLORIDE 0.9 % IV SOLN
INTRAVENOUS | Status: DC
  Administered 2017-03-27 (×2): via INTRAVENOUS

## 2017-03-26 MED ORDER — PANTOPRAZOLE SODIUM 40 MG PO TBEC
40.0000 mg | DELAYED_RELEASE_TABLET | Freq: Two times a day (BID) | ORAL | Status: AC
  Administered 2017-03-26 – 2017-03-30 (×9): 40 mg via ORAL
  Filled 2017-03-26 (×9): qty 1

## 2017-03-26 MED ORDER — PEG 3350-KCL-NA BICARB-NACL 420 G PO SOLR
4000.0000 mL | Freq: Once | ORAL | Status: AC
Start: 2017-03-26 — End: 2017-03-26
  Administered 2017-03-26: 4000 mL via ORAL
  Filled 2017-03-26: qty 4000

## 2017-03-26 MED ORDER — FUROSEMIDE 40 MG PO TABS
40.0000 mg | ORAL_TABLET | Freq: Every day | ORAL | Status: DC
  Administered 2017-03-26 – 2017-04-02 (×8): 40 mg via ORAL
  Filled 2017-03-26 (×6): qty 1
  Filled 2017-03-26: qty 2
  Filled 2017-03-26: qty 1

## 2017-03-26 MED ORDER — PHYTONADIONE 1 MG/0.5 ML ORAL SOLUTION
2.0000 mg | ORAL | Status: DC
  Filled 2017-03-26: qty 1

## 2017-03-26 NOTE — ED Notes (Signed)
GI dr into see pt  Dr Therisa Doyne into see pt

## 2017-03-26 NOTE — ED Notes (Signed)
No blood draw at this time,   Pt receiving blood transfusion. 

## 2017-03-26 NOTE — Progress Notes (Signed)
Family Medicine Teaching Service Daily Progress Note Intern Pager: [phone]  Patient name: [name] Medical record number: [id number] Date of birth: Feb 23, 1941 Age: 76 y.o. Gender: female  Primary Care Provider: Braulio Conte, FNP Consultants: GI Code Status: DNI  Pt Overview and Major Events to Date:  5/14 - admit to FPTS   Assessment and Plan: DEBBERA WOLKEN is a 76 y.o. female presenting with bloody bowel movements x3 and on Eliquis, last taken 5/14 am. PMH is significant for CAD with CABG 2009, ischemic cardiomyopathy, atrial fibrillation on Eliquis, systolic CHF (last echo 05/4080 EF 35-40% with diffuse hypokinesis and pulm HTN), HLD, DM, CKD, CVA.   Anemia: Thought to be due to lower GI bleed. Chronically anticoagulated on eliquis for afib, Eliquis currently being held. Hgb stable at 8.9 after 7.5 s/p 2u PRBC. GI planning for inpatient colonoscopy in AM.  - GI consulted, appreciate recs - Clears today, NPO at MN for colonoscopy in AM - if colonoscopy negative, GI considering EGD - CBCs q6H  - hold Eliquis in the setting of acute bleed - IV protonix - consider PM CBC - 2g vitamin K for elevated INR  A.fib, rate controlled: on Eliquis at home, last anticoagulation 5/14 am. Hold any anticoagulation given acute GI.  - hold eliquis - restart after GI intervention or stabilization of bleeding - continue coreg for rate control  HFpEF, mildly hypervolemic:  Follows with Dr. Julianne Handler. Home meds coreg, ASA, statin, lasix 40 mg daily. Patient reports this is baseline volume status. Lungs clear, breathing at baseline. BNP 149.9 - s/p 20 mg IV lasix in between transfusions - consider restarting home lasix (Cr improved 1.05 this AM) - monitor AM BMP for Cr  Diabetes: patient reports taking 23U Lantus QHS at home. CBG 170, 130, 121 overnight, received 8u long acting and 4u short acting insulin overnight - hemoglobin A1c - sensitive sliding scale - continue current dec dose  of insulin while clears/ NPO  titrate up as needed - q4H CBG while NPO  HTN: patient takes coreg, lasix at home.  - continue coreg  CAD/HLD: CABG 2009, ischemic cardiomyopathy. Patient follows with Dr. Angelena Form outpatient.  - no ARB as an outpatient due to h/o hypotension - continue lipitor  CKD3: follows with [facility] Dr. Audie Clear. Baseline in our system appears to be between 1.18 and 1.7.  -monitor on daily BMP -avoid nephrotoxic agents  FEN/GI: Clears today, NPO at MN, IV PPI  Prophylaxis: SCDs   Disposition: admit to telemetry  Subjective:  Patient did well overnight with no acute events. Tolerated blood transfusions x2 and received lasix 20 mg IV overnight.  Breathing comfortably on room air and feels LE edema is at baseline. Denies complaints this AM, agreeable to colonoscopy in AM.  Objective: Temp:  [98 F (36.7 C)-98.5 F (36.9 C)] 98.3 F (36.8 C) (05/15 0300) Pulse Rate:  [55-83] 78 (05/15 0915) Resp:  [13-23] 20 (05/15 0915) BP: (99-147)/(35-116) 121/62 (05/15 0915) SpO2:  [94 %-100 %] 100 % (05/15 0915) Physical Exam: General: NAD, rests comfortably on ED stretcher Cardiovascular: Irregular rhythm, rate regular no m/r/g Respiratory: CTA bil no W/R/R, moving air throughout, lung sounds distant with body habitus Abdomen: soft and nontender and nondistended Extremities: 1+ LE edema bil  Laboratory:  Recent Labs Lab 03/25/17 1620 03/26/17 0430  WBC 5.4 5.7  HGB 7.5* 8.9*  HCT 24.0* 27.4*  PLT 145* 125*    Recent Labs Lab 03/25/17 1620 03/26/17 0430  NA 142 141  K 3.9 3.7  CL 106 107  CO2 26 26  BUN 36* 31*  CREATININE 1.31* 1.07*  CALCIUM 8.7* 8.6*  PROT 6.4*  --   BILITOT 0.8  --   ALKPHOS 99  --   ALT 14  --   AST 27  --   GLUCOSE 250* 116*    Imaging/Diagnostic Tests: No results found.   Everrett Coombe, MD 03/26/2017, 9:30 AM PGY-1, [place] Intern pager: [phone], text pages welcome

## 2017-03-26 NOTE — ED Notes (Signed)
Family in room, pt up to [facility] with assist voided large amt, pt ate liquid diet , per pharmacy pt will start go lytely  tonight

## 2017-03-26 NOTE — ED Notes (Signed)
Got patient a bedside toilet patient is resting with family at bedside

## 2017-03-26 NOTE — Consult Note (Signed)
[facility] Consult  Referring Provider: Dorie Rank, MD Primary Care Physician:  Braulio Conte, FNP Primary Gastroenterologist: Dr.Timothy Misenheimer([place], [place])  Reason for Consultation:  Hematochezia  HPI: [name] is a 76 y.o. Caucasian female presented to the ER on 03/25/2017 with painless rectal bleeding. Patient was in his usual state of health until Friday evening, 5 days ago, when she noticed bright red blood on wiping. She noticed the same thing next evening, however, the amount of bleeding was slightly more. On Sunday, she noticed that she had several episodes of bright red blood per rectum, as  it continued yesterday (Monday), she decided to proceed to the ER. Patient takes Eliquis twice a day for stroke associated with atrial fibrillation and stopped Eliquis yesterday evening (last dose yesterday morning in a.m.). Patient reports that last colonoscopy was performed 3 years ago in [place] by Dr.Misenheimer, she had 9 polyps removed, and was supposed to repeat a colonoscopy for surveillance within a year, as one polyp was reported as large. However, she developed a stroke associated with weakness in movement and due to her age, it was decided that she should not get any further surveillance colonoscopies. Patient denies any recent unintentional weight loss, loss of appetite, or past bleeding episodes. She also denies acid reflux, difficulty in swallowing, pain on swallowing, early satiety or bloating. His family history of colon cancer. Patient denies any chest pain, shortness of breath, dizziness or loss of consciousness. Normally, she has a bowel movement every day or every other day, as she takes prunes and stool softeners on a regular basis.   Past Medical History:  Diagnosis Date  . ACC/AHA stage C congestive heart failure due to ischemic cardiomyopathy ([place]) 01/31/2009   Qualifier: Diagnosis of  By: Olevia Perches, MD, Glenetta Hew   . Acute CVA  (cerebrovascular accident) ([place]) 03/31/2016  . Acute hemorrhagic infarction of brain ([place])   . Acute kidney injury ([place]) 03/26/2017  . ANXIETY 01/31/2009   Qualifier: Diagnosis of  By: Lovette Cliche, CNA, Christy    . Arthritis    "qwhere" (03/30/2016)  . Atrial fibrillation ([place])    Postoperative in 2009;  S/P transesophageal echocardiography-guided cardioversion, previously on Amiodarone and Coumadin; recurrent in April 2013  . Benign essential HTN   . CAD (coronary artery disease) of artery bypass graft 05/2008   S/P CABG in 2009  . Cerebrovascular accident (CVA) due to embolism of right middle cerebral artery ([place])   . CHF (congestive heart failure) ([place])   . Chronic anticoagulation   . Chronic kidney disease (CKD), stage III (moderate)   . Chronic lower back pain   . Chronic pain syndrome   . CKD (chronic kidney disease) stage 3, GFR 30-59 ml/min 03/30/2016  . Coronary artery disease involving coronary bypass graft of native heart without angina pectoris   . DM type 2 with diabetic peripheral neuropathy ([place])   . Facial weakness, post-stroke   . Gastrointestinal hemorrhage 03/26/2017  . Heart murmur   . Hemiparesis ([place])   . History of CVA with residual deficit 03/26/2017  . Hyperlipidemia   . Hypertension   . Ischemic cardiomyopathy   . Morbid obesity ([place]) 03/26/2017  . Obesities, morbid ([place])   . Persistent atrial fibrillation ([place])   . Right middle cerebral artery stroke ([place]) 04/04/2016  . Stroke ([place])   . Systolic heart failure   . TACHYCARDIA, HX OF 01/31/2009   Qualifier: Diagnosis of  By: Lovette Cliche, CNA, Christy    . Tachypnea   .  Thrombocytopenia ([place])   . TIA (transient ischemic attack) 03/30/2016  . Type II diabetes mellitus ([place])   . UTI (urinary tract infection) 04/01/2016    Past Surgical History:  Procedure Laterality Date  . APPENDECTOMY  1946  . CATARACT EXTRACTION Right 2012  . CORONARY ANGIOPLASTY WITH STENT PLACEMENT  2009   "put 2 stents in"  . HERNIA REPAIR    .  JOINT REPLACEMENT    . TEE WITH CARDIOVERSION     Postoperative in 2009;  S/P transesophageal echocardiography-guided cardioversion,  . TOTAL KNEE ARTHROPLASTY Right 1994  . VENTRAL HERNIA REPAIR  10/1999   With mesh    Prior to Admission medications   Medication Sig Start Date End Date Taking? Authorizing Provider  apixaban (ELIQUIS) 5 MG TABS tablet Take 1 tablet (5 mg total) by mouth 2 (two) times daily. 04/26/16  Yes Kirsteins, Luanna Salk, MD  aspirin EC 81 MG tablet Take 1 tablet (81 mg total) by mouth daily. 08/27/16  Yes Burnell Blanks, MD  atorvastatin (LIPITOR) 40 MG tablet Take 1 tablet (40 mg total) by mouth daily at 6 PM. Patient taking differently: Take 40 mg by mouth at bedtime.  04/12/16  Yes Angiulli, Lavon Paganini, PA-C  carvedilol (COREG) 6.25 MG tablet Take 1 tablet (6.25 mg total) by mouth 2 (two) times daily with a meal. 04/12/16  Yes Angiulli, Lavon Paganini, PA-C  cholecalciferol (VITAMIN D) 1000 UNITS tablet Take 1,000 Units by mouth 2 (two) times daily.   Yes [provider]  docusate sodium (COLACE) 100 MG capsule Take 200 mg by mouth at bedtime.    Yes [provider]  furosemide (LASIX) 40 MG tablet Take 1 tablet (40 mg total) by mouth daily. 04/12/16  Yes Angiulli, Lavon Paganini, PA-C  gabapentin (NEURONTIN) 400 MG capsule Take 1 capsule (400 mg total) by mouth at bedtime. Patient taking differently: Take 400 mg by mouth 2 (two) times daily.  04/12/16  Yes Angiulli, Lavon Paganini, PA-C  insulin glargine (LANTUS) 100 UNIT/ML injection Inject 0.18 mLs (18 Units total) into the skin at bedtime. Patient taking differently: Inject 23 Units into the skin at bedtime.  04/12/16  Yes Angiulli, Lavon Paganini, PA-C  neomycin-bacitracin-polymyxin (NEOSPORIN) OINT Apply 1 application topically 3 (three) times daily as needed for wound care.   Yes [provider]  nitroGLYCERIN (NITROSTAT) 0.4 MG SL tablet Place 0.4 mg under the tongue every 5 (five) minutes as needed for chest  pain (up to 3 doses).    Yes [provider]  potassium chloride SA (K-DUR,KLOR-CON) 20 MEQ tablet Take 1 tablet (20 mEq total) by mouth daily. 04/12/16  Yes Angiulli, Lavon Paganini, PA-C  rOPINIRole (REQUIP) 0.5 MG tablet Take 1 mg by mouth at bedtime. 02/04/17  Yes [provider]  silver sulfADIAZINE (SILVADENE) 1 % cream Apply 1 application topically 3 (three) times daily as needed (wound care).   Yes [provider]  traMADol (ULTRAM) 50 MG tablet Take 50-100 mg by mouth See admin instructions. Take 2 tablets (100 mg) by mouth every morning and 1 tablet (50 mg) at night   Yes [provider]  vitamin B-12 (CYANOCOBALAMIN) 1000 MCG tablet Take 1,000 mcg by mouth daily.   Yes [provider]    Current Facility-Administered Medications  Medication Dose Route Frequency Provider Last Rate Last Dose  . acetaminophen (TYLENOL) tablet 650 mg  650 mg Oral Q6H PRN Sela Hilding, MD       Or  . acetaminophen (TYLENOL)  suppository 650 mg  650 mg Rectal Q6H PRN Sela Hilding, MD      . atorvastatin (LIPITOR) tablet 40 mg  40 mg Oral q1800 Sela Hilding, MD      . carvedilol (COREG) tablet 6.25 mg  6.25 mg Oral BID WC Sela Hilding, MD      . gabapentin (NEURONTIN) capsule 400 mg  400 mg Oral BID Sela Hilding, MD   400 mg at 03/25/17 2206  . insulin aspart (novoLOG) injection 0-9 Units  0-9 Units Subcutaneous Q4H Sela Hilding, MD   1 Units at 03/26/17 0414  . insulin glargine (LANTUS) injection 8 Units  8 Units Subcutaneous QHS Sela Hilding, MD   8 Units at 03/26/17 0155  . potassium chloride SA (K-DUR,KLOR-CON) CR tablet 20 mEq  20 mEq Oral Daily Sela Hilding, MD      . rOPINIRole (REQUIP) tablet 1 mg  1 mg Oral QHS Sela Hilding, MD      . sodium chloride flush (NS) 0.9 % injection 3 mL  3 mL Intravenous Q12H Sela Hilding, MD   3 mL at 03/25/17 2219  . vitamin B-12 (CYANOCOBALAMIN) tablet 1,000 mcg   1,000 mcg Oral Daily Sela Hilding, MD       Current Outpatient Prescriptions  Medication Sig Dispense Refill  . apixaban (ELIQUIS) 5 MG TABS tablet Take 1 tablet (5 mg total) by mouth 2 (two) times daily. 60 tablet 11  . aspirin EC 81 MG tablet Take 1 tablet (81 mg total) by mouth daily. 90 tablet 3  . atorvastatin (LIPITOR) 40 MG tablet Take 1 tablet (40 mg total) by mouth daily at 6 PM. (Patient taking differently: Take 40 mg by mouth at bedtime. ) 30 tablet 0  . carvedilol (COREG) 6.25 MG tablet Take 1 tablet (6.25 mg total) by mouth 2 (two) times daily with a meal. 60 tablet 6  . cholecalciferol (VITAMIN D) 1000 UNITS tablet Take 1,000 Units by mouth 2 (two) times daily.    Marland Kitchen docusate sodium (COLACE) 100 MG capsule Take 200 mg by mouth at bedtime.     . furosemide (LASIX) 40 MG tablet Take 1 tablet (40 mg total) by mouth daily. 30 tablet 0  . gabapentin (NEURONTIN) 400 MG capsule Take 1 capsule (400 mg total) by mouth at bedtime. (Patient taking differently: Take 400 mg by mouth 2 (two) times daily. ) 30 capsule 1  . insulin glargine (LANTUS) 100 UNIT/ML injection Inject 0.18 mLs (18 Units total) into the skin at bedtime. (Patient taking differently: Inject 23 Units into the skin at bedtime. ) 10 mL 11  . neomycin-bacitracin-polymyxin (NEOSPORIN) OINT Apply 1 application topically 3 (three) times daily as needed for wound care.    . nitroGLYCERIN (NITROSTAT) 0.4 MG SL tablet Place 0.4 mg under the tongue every 5 (five) minutes as needed for chest pain (up to 3 doses).     . potassium chloride SA (K-DUR,KLOR-CON) 20 MEQ tablet Take 1 tablet (20 mEq total) by mouth daily. 30 tablet 1  . rOPINIRole (REQUIP) 0.5 MG tablet Take 1 mg by mouth at bedtime.    . silver sulfADIAZINE (SILVADENE) 1 % cream Apply 1 application topically 3 (three) times daily as needed (wound care).    . traMADol (ULTRAM) 50 MG tablet Take 50-100 mg by mouth See admin instructions. Take 2 tablets (100 mg) by mouth  every morning and 1 tablet (50 mg) at night    . vitamin B-12 (CYANOCOBALAMIN) 1000 MCG tablet Take 1,000 mcg by mouth  daily.      Allergies as of 03/25/2017 - Review Complete 03/25/2017  Allergen Reaction Noted  . Benazepril Other (See Comments) 03/30/2016  . Fe-succ-c-thre-b12-des stomach Other (See Comments) 05/04/2009  . Spironolactone Other (See Comments) 05/04/2009  . Sulfamethoxazole-trimethoprim Other (See Comments) 05/04/2009    Family History  Problem Relation Age of Onset  . Clotting disorder Mother        Cerebral hemorrhage  . Emphysema Father        COD  . Lung cancer Brother   . Heart disease Neg Hx     Social History   Social History  . Marital status: Widowed    Spouse name: N/A  . Number of children: N/A  . Years of education: N/A   Occupational History  . Not on file.   Social History Main Topics  . Smoking status: Never Smoker  . Smokeless tobacco: Never Used  . Alcohol use No  . Drug use: No  . Sexual activity: Not Currently   Other Topics Concern  . Not on file   Social History Narrative   Widowed   Lives alone in [place]   2 children   Not routinely exercising    Review of Systems: As per history of present illness GI: Described in detail in HPI.    Gen: Denies any fever, chills, rigors, night sweats, anorexia, fatigue, weakness, malaise, involuntary weight loss, and sleep disorder CV: Denies chest pain, angina, palpitations, syncope, orthopnea, PND, peripheral edema, and claudication. Resp: Denies dyspnea, cough, sputum, wheezing, coughing up blood. GU : Denies urinary burning, blood in urine, urinary frequency, urinary hesitancy, nocturnal urination, and urinary incontinence. MS: Denies joint pain or swelling.  Denies muscle weakness, cramps, atrophy. She uses a walker to ambulate Derm: Denies rash, itching, oral ulcerations, hives, unhealing ulcers.  Psych: Denies depression, anxiety, memory loss, suicidal ideation, hallucinations,   and confusion. Heme: Denies bruising, bleeding, and enlarged lymph nodes. Neuro:  Denies any headaches, dizziness, paresthesias. Endo:  Denies any problems with DM, thyroid, adrenal function.  Physical Exam: Vital signs in last 24 hours: Temp:  [98 F (36.7 C)-98.5 F (36.9 C)] 98.3 F (36.8 C) (05/15 0300) Pulse Rate:  [55-83] 73 (05/15 0745) Resp:  [13-23] 20 (05/15 0400) BP: (99-147)/(35-116) 99/87 (05/15 0745) SpO2:  [94 %-100 %] 100 % (05/15 0745)    General:   Alert,  Well-developed, obese, pleasant and cooperative in NAD Head:  Normocephalic and atraumatic. Eyes:  Sclera clear, no icterus.   Conjunctiva pale Ears:  Normal auditory acuity. Nose:  No deformity, discharge,  or lesions. Mouth:  No deformity or lesions.  Oropharynx pink & moist. Neck:  Supple; no masses or thyromegaly. Lungs:  Clear throughout to auscultation.   No wheezes, crackles, or rhonchi. No acute distress. Heart:  Irregular rate and rhythm; no murmurs, clicks, rubs,  or gallops. Extremities:  Without clubbing or edema, chronic skin changes of lower extremities bilaterally Neurologic:  Alert and  oriented x4;  grossly normal neurologically. Skin:  Intact without significant lesions or rashes. Psych:  Alert and cooperative. Normal mood and affect. Abdomen:  Soft, nontender and nondistended. No masses, hepatosplenomegaly or hernias noted. Normal bowel sounds, without guarding, and without rebound.         Lab Results:  Recent Labs  03/25/17 1620 03/26/17 0430  WBC 5.4 5.7  HGB 7.5* 8.9*  HCT 24.0* 27.4*  PLT 145* 125*   BMET  Recent Labs  03/25/17 1620 03/26/17 0430  NA 142 141  K 3.9 3.7  CL 106 107  CO2 26 26  GLUCOSE 250* 116*  BUN 36* 31*  CREATININE 1.31* 1.07*  CALCIUM 8.7* 8.6*   LFT  Recent Labs  03/25/17 1620  PROT 6.4*  ALBUMIN 3.3*  AST 27  ALT 14  ALKPHOS 99  BILITOT 0.8   PT/INR  Recent Labs  03/25/17 2024 03/26/17 0430  LABPROT 19.6* 18.7*  INR 1.64  1.55    Studies/Results: No results found.  Impression: Painless rectal bleeding, elevated BUN/creatinine ratio Anemia, status post 2 units blood transfusion Atrial fibrillation on Eliquis (currently on hold) Coronary artery disease, status post CABG History of Stroke  Plan: Patient remains hemodynamically stable, plan to start the patient on clear liquid diet and keep her nothing by mouth post midnight for diagnostic colonoscopy to be performed in a.m. The etiology for painless hematochezia is likely diverticular bleeding. Other etiologies to consider include AV malformations and less likely malignancy. As the BUN is elevated in proportion to creatinine this may signify a right colonic bleed. If colonoscopy is unremarkable, an EGD has to be considered to rule out an upper GI source of bleeding such as peptic ulcer disease. Upper GI bleeding is less likely as patient has remained hemodynamically stable.   LOS: 1 day   Ronnette Juniper, M.D.  03/26/2017, 8:15 AM  Pager [phone] If no answer or after 5 PM call [phone]

## 2017-03-27 ENCOUNTER — Encounter (HOSPITAL_COMMUNITY)
Admission: EM | Disposition: A | Discharge status: Home / Self Care | Payer: Self-pay | Source: Home / Self Care | Attending: Family Medicine

## 2017-03-27 ENCOUNTER — Inpatient Hospital Stay (HOSPITAL_COMMUNITY): Payer: Medicare Other | Admitting: Anesthesiology

## 2017-03-27 ENCOUNTER — Inpatient Hospital Stay (HOSPITAL_COMMUNITY): Payer: Medicare Other

## 2017-03-27 ENCOUNTER — Encounter (HOSPITAL_COMMUNITY): Payer: Self-pay | Admitting: *Deleted

## 2017-03-27 DIAGNOSIS — I4891 Unspecified atrial fibrillation: Secondary | ICD-10-CM

## 2017-03-27 HISTORY — PX: COLONOSCOPY WITH PROPOFOL: SHX5780

## 2017-03-27 LAB — CBC
HCT: 28.2 % — ABNORMAL LOW (ref 36.0–46.0)
HEMATOCRIT: 27.3 % — AB (ref 36.0–46.0)
HEMOGLOBIN: 8.8 g/dL — AB (ref 12.0–15.0)
Hemoglobin: 8.9 g/dL — ABNORMAL LOW (ref 12.0–15.0)
MCH: 28 pg (ref 26.0–34.0)
MCH: 29.4 pg (ref 26.0–34.0)
MCHC: 31.2 g/dL (ref 30.0–36.0)
MCHC: 32.6 g/dL (ref 30.0–36.0)
MCV: 89.8 fL (ref 78.0–100.0)
MCV: 90.1 fL (ref 78.0–100.0)
PLATELETS: 121 10*3/uL — AB (ref 150–400)
PLATELETS: 122 10*3/uL — AB (ref 150–400)
RBC: 3.14 MIL/uL — ABNORMAL LOW (ref 3.87–5.11)
RBC: 3.03 MIL/uL — ABNORMAL LOW (ref 3.87–5.11)
RDW: 15.2 % (ref 11.5–15.5)
RDW: 15.4 % (ref 11.5–15.5)
WBC: 7.1 10*3/uL (ref 4.0–10.5)
WBC: 6.7 10*3/uL (ref 4.0–10.5)

## 2017-03-27 LAB — BASIC METABOLIC PANEL
Anion gap: 10 (ref 5–15)
BUN: 26 mg/dL — AB (ref 6–20)
CALCIUM: 8.5 mg/dL — AB (ref 8.9–10.3)
CO2: 24 mmol/L (ref 22–32)
Chloride: 105 mmol/L (ref 101–111)
Creatinine, Ser: 1.07 mg/dL — ABNORMAL HIGH (ref 0.44–1.00)
GFR calc Af Amer: 56 mL/min — ABNORMAL LOW (ref >60)
GFR, EST NON AFRICAN AMERICAN: 48 mL/min — AB (ref >60)
GLUCOSE: 108 mg/dL — AB (ref 65–99)
Potassium: 3.6 mmol/L (ref 3.5–5.1)
SODIUM: 139 mmol/L (ref 135–145)

## 2017-03-27 LAB — HEMOGLOBIN A1C
Hgb A1c MFr Bld: 6.5 % — ABNORMAL HIGH (ref 4.8–5.6)
Mean Plasma Glucose: 140 mg/dL

## 2017-03-27 LAB — GLUCOSE, CAPILLARY
Glucose-Capillary: 111 mg/dL — ABNORMAL HIGH (ref 65–99)
Glucose-Capillary: 87 mg/dL (ref 65–99)
Glucose-Capillary: 70 mg/dL (ref 65–99)
Glucose-Capillary: 75 mg/dL (ref 65–99)
Glucose-Capillary: 83 mg/dL (ref 65–99)

## 2017-03-27 SURGERY — COLONOSCOPY
Anesthesia: Monitor Anesthesia Care | Laterality: Left

## 2017-03-27 SURGERY — COLONOSCOPY WITH PROPOFOL
Anesthesia: Monitor Anesthesia Care | Laterality: Left

## 2017-03-27 MED ORDER — PROPOFOL 500 MG/50ML IV EMUL
INTRAVENOUS | Status: DC | PRN
  Administered 2017-03-27: 40 ug/kg/min via INTRAVENOUS

## 2017-03-27 MED ORDER — TECHNETIUM TC 99M-LABELED RED BLOOD CELLS IV KIT
25.0000 | PACK | Freq: Once | INTRAVENOUS | Status: AC | PRN
  Administered 2017-03-27: 25 via INTRAVENOUS

## 2017-03-27 MED ORDER — ORAL CARE MOUTH RINSE
15.0000 mL | Freq: Two times a day (BID) | OROMUCOSAL | Status: DC
  Administered 2017-03-27 – 2017-04-02 (×8): 15 mL via OROMUCOSAL

## 2017-03-27 MED ORDER — TECHNETIUM TC 99M MEDRONATE IV KIT
25.0000 | PACK | Freq: Once | INTRAVENOUS | Status: AC | PRN
  Administered 2017-03-27: 25 via INTRAVENOUS

## 2017-03-27 MED ORDER — LIDOCAINE HCL (CARDIAC) 20 MG/ML IV SOLN
INTRAVENOUS | Status: DC | PRN
  Administered 2017-03-27: 10 mg via INTRATRACHEAL

## 2017-03-27 MED ORDER — PROPOFOL 10 MG/ML IV BOLUS
INTRAVENOUS | Status: DC | PRN
  Administered 2017-03-27 (×2): 20 mg via INTRAVENOUS

## 2017-03-27 NOTE — H&P (Signed)
[name] is an 76 y.o. female.   Chief Complaint: Hematochezia HPI: 76 year old female with painless rectal bleeding suspicious for diverticular bleed. Has not needed any blood transfusion and remains hemodynamically stable.  Past Medical History:  Diagnosis Date  . ACC/AHA stage C congestive heart failure due to ischemic cardiomyopathy ([place]) 01/31/2009   Qualifier: Diagnosis of  By: Olevia Perches, MD, Glenetta Hew   . Acute CVA (cerebrovascular accident) ([place]) 03/31/2016  . Acute hemorrhagic infarction of brain ([place])   . Acute kidney injury ([place]) 03/26/2017  . ANXIETY 01/31/2009   Qualifier: Diagnosis of  By: Lovette Cliche, CNA, Christy    . Arthritis    "qwhere" (03/30/2016)  . Atrial fibrillation ([place])    Postoperative in 2009;  S/P transesophageal echocardiography-guided cardioversion, previously on Amiodarone and Coumadin; recurrent in April 2013  . Benign essential HTN   . CAD (coronary artery disease) of artery bypass graft 05/2008   S/P CABG in 2009  . Cerebrovascular accident (CVA) due to embolism of right middle cerebral artery ([place])   . CHF (congestive heart failure) ([place])   . Chronic anticoagulation   . Chronic kidney disease (CKD), stage III (moderate)   . Chronic lower back pain   . Chronic pain syndrome   . CKD (chronic kidney disease) stage 3, GFR 30-59 ml/min 03/30/2016  . Coronary artery disease involving coronary bypass graft of native heart without angina pectoris   . DM type 2 with diabetic peripheral neuropathy ([place])   . Facial weakness, post-stroke   . Gastrointestinal hemorrhage 03/26/2017  . Heart murmur   . Hemiparesis ([place])   . History of CVA with residual deficit 03/26/2017  . Hyperlipidemia   . Hypertension   . Ischemic cardiomyopathy   . Morbid obesity ([place]) 03/26/2017  . Obesities, morbid ([place])   . Persistent atrial fibrillation ([place])   . Right middle cerebral artery stroke ([place]) 04/04/2016  . Stroke ([place])   . Systolic heart failure   . TACHYCARDIA, HX OF  01/31/2009   Qualifier: Diagnosis of  By: Lovette Cliche, CNA, Christy    . Tachypnea   . Thrombocytopenia ([place])   . TIA (transient ischemic attack) 03/30/2016  . Type II diabetes mellitus ([place])   . UTI (urinary tract infection) 04/01/2016    Past Surgical History:  Procedure Laterality Date  . APPENDECTOMY  1946  . CATARACT EXTRACTION Right 2012  . CORONARY ANGIOPLASTY WITH STENT PLACEMENT  2009   "put 2 stents in"  . HERNIA REPAIR    . JOINT REPLACEMENT    . TEE WITH CARDIOVERSION     Postoperative in 2009;  S/P transesophageal echocardiography-guided cardioversion,  . TOTAL KNEE ARTHROPLASTY Right 1994  . VENTRAL HERNIA REPAIR  10/1999   With mesh    Family History  Problem Relation Age of Onset  . Clotting disorder Mother        Cerebral hemorrhage  . Emphysema Father        COD  . Lung cancer Brother   . Heart disease Neg Hx    Social History:  reports that she has never smoked. She has never used smokeless tobacco. She reports that she does not drink alcohol or use drugs.  Allergies:  Allergies  Allergen Reactions  . Benazepril Other (See Comments)    Unknown reaction at age 17-65 - possibly dizziness  . Angiotensin Receptor Blockers Other (See Comments)    Hypotension reaction  . Fe-Succ-C-Thre-B12-Des Stomach Other (See Comments)    Unknown reaction  . Spironolactone Other (  See Comments)    Dizziness   . Sulfamethoxazole-Trimethoprim Other (See Comments)    Unknown reaction    Medications Prior to Admission  Medication Sig Dispense Refill  . apixaban (ELIQUIS) 5 MG TABS tablet Take 1 tablet (5 mg total) by mouth 2 (two) times daily. 60 tablet 11  . aspirin EC 81 MG tablet Take 1 tablet (81 mg total) by mouth daily. 90 tablet 3  . atorvastatin (LIPITOR) 40 MG tablet Take 1 tablet (40 mg total) by mouth daily at 6 PM. (Patient taking differently: Take 40 mg by mouth at bedtime. ) 30 tablet 0  . carvedilol (COREG) 6.25 MG tablet Take 1 tablet (6.25 mg total) by mouth 2  (two) times daily with a meal. 60 tablet 6  . cholecalciferol (VITAMIN D) 1000 UNITS tablet Take 1,000 Units by mouth 2 (two) times daily.    Marland Kitchen docusate sodium (COLACE) 100 MG capsule Take 200 mg by mouth at bedtime.     . furosemide (LASIX) 40 MG tablet Take 1 tablet (40 mg total) by mouth daily. 30 tablet 0  . gabapentin (NEURONTIN) 400 MG capsule Take 1 capsule (400 mg total) by mouth at bedtime. (Patient taking differently: Take 400 mg by mouth 2 (two) times daily. ) 30 capsule 1  . insulin glargine (LANTUS) 100 UNIT/ML injection Inject 0.18 mLs (18 Units total) into the skin at bedtime. (Patient taking differently: Inject 23 Units into the skin at bedtime. ) 10 mL 11  . neomycin-bacitracin-polymyxin (NEOSPORIN) OINT Apply 1 application topically 3 (three) times daily as needed for wound care.    . nitroGLYCERIN (NITROSTAT) 0.4 MG SL tablet Place 0.4 mg under the tongue every 5 (five) minutes as needed for chest pain (up to 3 doses).     . potassium chloride SA (K-DUR,KLOR-CON) 20 MEQ tablet Take 1 tablet (20 mEq total) by mouth daily. 30 tablet 1  . rOPINIRole (REQUIP) 0.5 MG tablet Take 1 mg by mouth at bedtime.    . silver sulfADIAZINE (SILVADENE) 1 % cream Apply 1 application topically 3 (three) times daily as needed (wound care).    . traMADol (ULTRAM) 50 MG tablet Take 50-100 mg by mouth See admin instructions. Take 2 tablets (100 mg) by mouth every morning and 1 tablet (50 mg) at night    . vitamin B-12 (CYANOCOBALAMIN) 1000 MCG tablet Take 1,000 mcg by mouth daily.      Results for orders placed or performed during the hospital encounter of 03/25/17 (from the past 48 hour(s))  Comprehensive metabolic panel     Status: Abnormal   Collection Time: 03/25/17  4:20 PM  Result Value Ref Range   Sodium 142 135 - 145 mmol/L   Potassium 3.9 3.5 - 5.1 mmol/L   Chloride 106 101 - 111 mmol/L   CO2 26 22 - 32 mmol/L   Glucose, Bld 250 (H) 65 - 99 mg/dL   BUN 36 (H) 6 - 20 mg/dL   Creatinine,  Ser 1.31 (H) 0.44 - 1.00 mg/dL   Calcium 8.7 (L) 8.9 - 10.3 mg/dL   Total Protein 6.4 (L) 6.5 - 8.1 g/dL   Albumin 3.3 (L) 3.5 - 5.0 g/dL   AST 27 15 - 41 U/L   ALT 14 14 - 54 U/L   Alkaline Phosphatase 99 38 - 126 U/L   Total Bilirubin 0.8 0.3 - 1.2 mg/dL   GFR calc non Af Amer 38 (L) >60 mL/min   GFR calc Af Amer 44 (L) >60 mL/min  Comment: (NOTE) The eGFR has been calculated using the CKD EPI equation. This calculation has not been validated in all clinical situations. eGFR's persistently <60 mL/min signify possible Chronic Kidney Disease.    Anion gap 10 5 - 15  CBC     Status: Abnormal   Collection Time: 03/25/17  4:20 PM  Result Value Ref Range   WBC 5.4 4.0 - 10.5 K/uL   RBC 2.63 (L) 3.87 - 5.11 MIL/uL   Hemoglobin 7.5 (L) 12.0 - 15.0 g/dL   HCT 24.0 (L) 36.0 - 46.0 %   MCV 91.3 78.0 - 100.0 fL   MCH 28.5 26.0 - 34.0 pg   MCHC 31.3 30.0 - 36.0 g/dL   RDW 15.3 11.5 - 15.5 %   Platelets 145 (L) 150 - 400 K/uL  Type and screen [place]     Status: None   Collection Time: 03/25/17  4:20 PM  Result Value Ref Range   ABO/RH(D) O POS    Antibody Screen NEG    Sample Expiration 03/28/2017    Unit Number [id number]    Blood Component Type RED CELLS,LR    Unit division 00    Status of Unit ISSUED,FINAL    Transfusion Status OK TO TRANSFUSE    Crossmatch Result Compatible    Unit Number [id number]    Blood Component Type RED CELLS,LR    Unit division 00    Status of Unit ISSUED,FINAL    Transfusion Status OK TO TRANSFUSE    Crossmatch Result Compatible   Prepare RBC     Status: None   Collection Time: 03/25/17  7:04 PM  Result Value Ref Range   Order Confirmation ORDER PROCESSED BY BLOOD BANK   POC occult blood, ED     Status: Abnormal   Collection Time: 03/25/17  7:21 PM  Result Value Ref Range   Fecal Occult Bld POSITIVE (A) NEGATIVE  Protime-INR     Status: Abnormal   Collection Time: 03/25/17  8:24 PM  Result Value Ref Range    Prothrombin Time 19.6 (H) 11.4 - 15.2 seconds   INR 1.64   APTT     Status: Abnormal   Collection Time: 03/25/17  8:24 PM  Result Value Ref Range   aPTT 37 (H) 24 - 36 seconds    Comment:        IF BASELINE aPTT IS ELEVATED, SUGGEST PATIENT RISK ASSESSMENT BE USED TO DETERMINE APPROPRIATE ANTICOAGULANT THERAPY.   Brain natriuretic peptide     Status: Abnormal   Collection Time: 03/25/17  8:24 PM  Result Value Ref Range   B Natriuretic Peptide 149.9 (H) 0.0 - 100.0 pg/mL  CBG monitoring, ED     Status: Abnormal   Collection Time: 03/25/17  9:35 PM  Result Value Ref Range   Glucose-Capillary 170 (H) 65 - 99 mg/dL  CBG monitoring, ED     Status: Abnormal   Collection Time: 03/26/17  1:05 AM  Result Value Ref Range   Glucose-Capillary 130 (H) 65 - 99 mg/dL  CBG monitoring, ED     Status: Abnormal   Collection Time: 03/26/17  4:04 AM  Result Value Ref Range   Glucose-Capillary 121 (H) 65 - 99 mg/dL   Comment 1 Notify RN    Comment 2 Document in Chart   Basic metabolic panel     Status: Abnormal   Collection Time: 03/26/17  4:30 AM  Result Value Ref Range   Sodium 141 135 - 145 mmol/L  Potassium 3.7 3.5 - 5.1 mmol/L   Chloride 107 101 - 111 mmol/L   CO2 26 22 - 32 mmol/L   Glucose, Bld 116 (H) 65 - 99 mg/dL   BUN 31 (H) 6 - 20 mg/dL   Creatinine, Ser 1.07 (H) 0.44 - 1.00 mg/dL   Calcium 8.6 (L) 8.9 - 10.3 mg/dL   GFR calc non Af Amer 48 (L) >60 mL/min   GFR calc Af Amer 56 (L) >60 mL/min    Comment: (NOTE) The eGFR has been calculated using the CKD EPI equation. This calculation has not been validated in all clinical situations. eGFR's persistently <60 mL/min signify possible Chronic Kidney Disease.    Anion gap 8 5 - 15  CBC     Status: Abnormal   Collection Time: 03/26/17  4:30 AM  Result Value Ref Range   WBC 5.7 4.0 - 10.5 K/uL   RBC 3.04 (L) 3.87 - 5.11 MIL/uL   Hemoglobin 8.9 (L) 12.0 - 15.0 g/dL   HCT 27.4 (L) 36.0 - 46.0 %   MCV 90.1 78.0 - 100.0 fL   MCH  29.3 26.0 - 34.0 pg   MCHC 32.5 30.0 - 36.0 g/dL   RDW 15.5 11.5 - 15.5 %   Platelets 125 (L) 150 - 400 K/uL  Protime-INR     Status: Abnormal   Collection Time: 03/26/17  4:30 AM  Result Value Ref Range   Prothrombin Time 18.7 (H) 11.4 - 15.2 seconds   INR 1.55   Hemoglobin A1c     Status: Abnormal   Collection Time: 03/26/17  4:30 AM  Result Value Ref Range   Hgb A1c MFr Bld 6.5 (H) 4.8 - 5.6 %    Comment: (NOTE)         Pre-diabetes: 5.7 - 6.4         Diabetes: >6.4         Glycemic control for adults with diabetes: <7.0    Mean Plasma Glucose 140 mg/dL    Comment: (NOTE) Performed At: [facility] [place], [place] [id number] Lindon Romp MD HB:[id number]   CBG monitoring, ED     Status: Abnormal   Collection Time: 03/26/17  9:27 AM  Result Value Ref Range   Glucose-Capillary 101 (H) 65 - 99 mg/dL  CBC     Status: Abnormal   Collection Time: 03/26/17  3:40 PM  Result Value Ref Range   WBC 6.6 4.0 - 10.5 K/uL   RBC 3.15 (L) 3.87 - 5.11 MIL/uL   Hemoglobin 9.1 (L) 12.0 - 15.0 g/dL   HCT 28.3 (L) 36.0 - 46.0 %   MCV 89.8 78.0 - 100.0 fL   MCH 28.9 26.0 - 34.0 pg   MCHC 32.2 30.0 - 36.0 g/dL   RDW 15.6 (H) 11.5 - 15.5 %   Platelets 121 (L) 150 - 400 K/uL  CBG monitoring, ED     Status: Abnormal   Collection Time: 03/26/17  4:12 PM  Result Value Ref Range   Glucose-Capillary 113 (H) 65 - 99 mg/dL  MRSA PCR Screening     Status: None   Collection Time: 03/26/17  5:14 PM  Result Value Ref Range   MRSA by PCR NEGATIVE NEGATIVE    Comment:        The GeneXpert MRSA Assay (FDA approved for NASAL specimens only), is one component of a comprehensive MRSA colonization surveillance program. It is not intended to diagnose MRSA infection nor to guide or monitor  treatment for MRSA infections.   Glucose, capillary     Status: Abnormal   Collection Time: 03/26/17  8:06 PM  Result Value Ref Range   Glucose-Capillary 129 (H) 65 - 99 mg/dL   Glucose, capillary     Status: Abnormal   Collection Time: 03/26/17 10:40 PM  Result Value Ref Range   Glucose-Capillary 116 (H) 65 - 99 mg/dL  CBC     Status: Abnormal   Collection Time: 03/27/17  3:23 AM  Result Value Ref Range   WBC 7.1 4.0 - 10.5 K/uL   RBC 3.14 (L) 3.87 - 5.11 MIL/uL   Hemoglobin 8.8 (L) 12.0 - 15.0 g/dL   HCT 28.2 (L) 36.0 - 46.0 %   MCV 89.8 78.0 - 100.0 fL   MCH 28.0 26.0 - 34.0 pg   MCHC 31.2 30.0 - 36.0 g/dL   RDW 15.2 11.5 - 15.5 %   Platelets 121 (L) 150 - 400 K/uL  Basic metabolic panel     Status: Abnormal   Collection Time: 03/27/17  3:23 AM  Result Value Ref Range   Sodium 139 135 - 145 mmol/L   Potassium 3.6 3.5 - 5.1 mmol/L   Chloride 105 101 - 111 mmol/L   CO2 24 22 - 32 mmol/L   Glucose, Bld 108 (H) 65 - 99 mg/dL   BUN 26 (H) 6 - 20 mg/dL   Creatinine, Ser 1.07 (H) 0.44 - 1.00 mg/dL   Calcium 8.5 (L) 8.9 - 10.3 mg/dL   GFR calc non Af Amer 48 (L) >60 mL/min   GFR calc Af Amer 56 (L) >60 mL/min    Comment: (NOTE) The eGFR has been calculated using the CKD EPI equation. This calculation has not been validated in all clinical situations. eGFR's persistently <60 mL/min signify possible Chronic Kidney Disease.    Anion gap 10 5 - 15  Glucose, capillary     Status: Abnormal   Collection Time: 03/27/17  3:38 AM  Result Value Ref Range   Glucose-Capillary 111 (H) 65 - 99 mg/dL   Comment 1 Notify RN   Glucose, capillary     Status: None   Collection Time: 03/27/17  8:08 AM  Result Value Ref Range   Glucose-Capillary 87 65 - 99 mg/dL   No results found.  ROS  As per prior consult  Blood pressure (!) 175/48, pulse 72, temperature 98.6 F (37 C), temperature source Oral, resp. rate 12, height '5\' 10"'$  (1.778 m), weight 135.2 kg (298 lb), SpO2 100 %. Physical Exam  Mild pallor, not in acute distress No change in physical examination from yesterday  Assessment/Plan Diagnostic colonoscopy today.  Ronnette Juniper, MD 03/27/2017, 9:41  AM

## 2017-03-27 NOTE — Brief Op Note (Signed)
03/25/2017 - 03/27/2017  10:52 AM  PATIENT:  [name]  76 y.o. female  PRE-OPERATIVE DIAGNOSIS:  Hematochezia  POST-OPERATIVE DIAGNOSIS:  sigmoid diverticulosis ? diverticular bleed  PROCEDURE:  Procedure(s): COLONOSCOPY WITH PROPOFOL (Left)  SURGEON:  Surgeon(s) and Role:    Ronnette Juniper, MD - Primary  PHYSICIAN ASSISTANT:   ASSISTANTS: none   ANESTHESIA:   MAC  EBL:  Total I/O In: -  Out: 100 [Urine:100]  BLOOD ADMINISTERED:none  DRAINS: none   LOCAL MEDICATIONS USED:  NONE  SPECIMEN:  No Specimen  DISPOSITION OF SPECIMEN:  N/A  COUNTS:  YES  TOURNIQUET:  * No tourniquets in log *  DICTATION: .Dragon Dictation  PLAN OF CARE: Admit to inpatient   PATIENT DISPOSITION:  PACU - hemodynamically stable.   Delay start of Pharmacological VTE agent (>24hrs) due to surgical blood loss or risk of bleeding: yes

## 2017-03-27 NOTE — Op Note (Signed)
[facility] Patient Name: [name] Procedure Date : 03/27/2017 MRN: [id number] Attending MD: Ronnette Juniper , MD Date of Birth: [birth year] CSN: [id number] Age: 76 Admit Type: Outpatient Procedure:                Colonoscopy Indications:              Last colonoscopy 5 years ago, Rectal bleeding Providers:                Ronnette Juniper, MD, Burtis Junes, RN, Lillie Fragmin, RN,                            Corliss Parish, Technician, Marcene Duos,                            Technician Referring MD:              Medicines:                Monitored Anesthesia Care Complications:            No immediate complications. Estimated Blood Loss:     Estimated blood loss: none. Procedure:                Pre-Anesthesia Assessment:                           - Prior to the procedure, a History and Physical                            was performed, and patient medications and                            allergies were reviewed. The patient's tolerance of                            previous anesthesia was also reviewed. The risks                            and benefits of the procedure and the sedation                            options and risks were discussed with the patient.                            All questions were answered, and informed consent                            was obtained. Prior Anticoagulants: The patient has                            taken Eliquis (apixaban), last dose was 1 day prior                            to procedure. ASA Grade Assessment: II - A patient  with mild systemic disease. After reviewing the                            risks and benefits, the patient was deemed in                            satisfactory condition to undergo the procedure.                           After obtaining informed consent, the colonoscope                            was passed under direct vision. Throughout the                            procedure,  the patient's blood pressure, pulse, and                            oxygen saturations were monitored continuously. The                            EC-[id number] ([id number]) scope was introduced through                            the anus and advanced to the the cecum, identified                            by appendiceal orifice and ileocecal valve. The                            colonoscopy was performed without difficulty. The                            patient tolerated the procedure well. The quality                            of the bowel preparation was fair. The ileocecal                            valve, appendiceal orifice, and rectum were                            photographed. Scope In: 10:13:31 AM Scope Out: 10:41:50 AM Scope Withdrawal Time: 0 hours 20 minutes 4 seconds  Total Procedure Duration: 0 hours 28 minutes 19 seconds  Findings:      The perianal and digital rectal examinations were normal.      Blood was found in the entire colon. There was fresh as well as old       blood and clots noted in the entire colon. After thorough lavage, there       was no evidence of active bleeding noted.      A few small-mouthed diverticula were found in the sigmoid colon. Impression:               - Preparation of  the colon was fair.                           - Blood in the entire examined colon.                           - Diverticulosis in the sigmoid colon.                           - No specimens collected. Moderate Sedation:      Patient did not receive moderate sedation for this procedure, but       instead received monitored anesthesia care. Recommendation:           - NPO.                           - Do a GI bleeding (tagged RBC) scan today.                           - If no active bleeding noted on bleeding scan, we                            shall assume that hematochezia was secondary to                            diverticular bleeding.                           - If no active  bleeding noted on bleeding scan, ok                            to start the patient on clear liquid diet.                           - If H and H remains stable in am, ok to start high                            fiber diet and d/c in am.                           - If no further bleeding noted, no further drop in                            Hb and negative bleeding scan, may resume                            anticoagulation in 3 days.                           - Continue present medications.                           - Return patient to hospital ward for ongoing care.                           -  Repeat colonoscopy in 6 months because the bowel                            preparation was suboptimal. Procedure Code(s):        --- Professional ---                           [phone], Colonoscopy, flexible; diagnostic, including                            collection of specimen(s) by brushing or washing,                            when performed (separate procedure) Diagnosis Code(s):        --- Professional ---                           K92.2, Gastrointestinal hemorrhage, unspecified                           K62.5, Hemorrhage of anus and rectum                           K57.30, Diverticulosis of large intestine without                            perforation or abscess without bleeding CPT copyright 2016 [employer]. All rights reserved. The codes documented in this report are preliminary and upon coder review may  be revised to meet current compliance requirements. Ronnette Juniper, MD 03/27/2017 10:52:29 AM This report has been signed electronically. Number of Addenda: 0

## 2017-03-27 NOTE — Anesthesia Preprocedure Evaluation (Signed)
Anesthesia Evaluation  Patient identified by MRN, date of birth, ID band Patient awake    Reviewed: Allergy & Precautions, H&P , Patient's Chart, lab work & pertinent test results, reviewed documented beta blocker date and time   Airway Mallampati: II  TM Distance: >3 FB Neck ROM: full    Dental no notable dental hx.    Pulmonary    Pulmonary exam normal breath sounds clear to auscultation       Cardiovascular hypertension,  Rhythm:regular Rate:Normal     Neuro/Psych    GI/Hepatic   Endo/Other  diabetes  Renal/GU      Musculoskeletal   Abdominal   Peds  Hematology  (+) anemia ,   Anesthesia Other Findings Left ventricle: The cavity size was normal. Wall thickness was normal. Systolic function was moderately reduced. The estimated ejection fraction was in the range of 35% to 40%. Diffuse hypokinesis. Doppler parameters are consistent with high ventricular filling pressure. - Tricuspid valve: There was moderate regurgitation. - Pulmonary arteries: Systolic pressure was moderately increased. PA peak pressure: 49 mm Hg (S).  CAD  Afib CVA DM   Reproductive/Obstetrics                             Anesthesia Physical Anesthesia Plan  ASA: II  Anesthesia Plan: MAC   Post-op Pain Management:    Induction: Intravenous  Airway Management Planned: Mask and Natural Airway  Additional Equipment:   Intra-op Plan:   Post-operative Plan:   Informed Consent: I have reviewed the patients History and Physical, chart, labs and discussed the procedure including the risks, benefits and alternatives for the proposed anesthesia with the patient or authorized representative who has indicated his/her understanding and acceptance.   Dental Advisory Given  Plan Discussed with: CRNA and Surgeon  Anesthesia Plan Comments:         Anesthesia Quick Evaluation

## 2017-03-27 NOTE — Discharge Summary (Signed)
[facility] Discharge Summary  Patient name: [name] Medical record number: [id number] Date of birth: 06-06-1941 Age: 76 y.o. Gender: female Date of Admission: 03/25/2017  Date of Discharge: 04/02/2017 Admitting Physician: Blane Ohara McDiarmid, MD  Primary Care Provider: Patient, No Pcp Per Consultants: Gastroenterology  Indication for Hospitalization: Anemia and GI bleed  Discharge Diagnoses/Problem List:  Patient Active Problem List   Diagnosis Date Noted  . Abdominal mass, RLQ (right lower quadrant)   . Acute kidney injury ([place]) 03/26/2017  . History of CVA with residual deficit 03/26/2017  . Bleeding 03/26/2017  . Pulmonary hypertension ([place]) 03/26/2017  . Morbid obesity ([place]) 03/26/2017  . Rectal bleeding   . Acute blood loss anemia 03/25/2017  . Persistent atrial fibrillation ([place])   . Chronic pain syndrome   . Coronary artery disease involving coronary bypass graft of native heart without angina pectoris   . Benign essential HTN   . DM type 2 with diabetic peripheral neuropathy ([place])   . Thrombocytopenia ([place])   . CKD (chronic kidney disease) stage 3, GFR 30-59 ml/min 03/30/2016  . Chronic anticoagulation 03/30/2016  . Hyperlipidemia 01/31/2009  . ACC/AHA stage C congestive heart failure due to ischemic cardiomyopathy ([place]) 01/31/2009   Disposition: Home  Discharge Condition: Stable  Discharge Exam:   General:  Patient sitting in chair in no acute distress, pleasant and able to participate in exam and answer question. Cardiac: RRR, normal heart sounds, no murmurs. 2+ radial and PT pulses bilaterally Respiratory: CTAB, normal effort, No wheezes, rales or rhonchi Abdomen: soft, nontender, nondistended, no hepatic or splenomegaly, +BS Extremities: no edema or cyanosis. WWP. Skin: warm and dry, no rashes noted Neuro: alert and oriented x4, no focal deficits Psych: Normal affect and mood  Brief Hospital Course:  [name] a 76  y.o.femalepresenting with bloody bowel movements x3 and on Eliquis, last taken 5/14 am. PMH is significant for CAD with CABG 2009, ischemic cardiomyopathy, atrial fibrillation on Eliquis, systolic CHF (last echo 01/7627 EF 35-40% with diffuse hypokinesis and pulm HTN), HLD, DM, CKD, CVA.    Patient presented with anemia to 7.5 with history of three bloody bowel movements. She was transfused with 2u PRBC and Hgb improved to 8.9.  She was chronically anticoagulated on eliquis for atrial fibrillation which was held on admission. GI was consulted and colonoscopy was performed with showed blood and clots in the entire colon. Some diverticulosis was noted in the sigmoid colon. There was no definite source of bleeding, but thought to be diverticular in nature. A tagged RBC was subsequently done to rule out active bleed. During bleeding scan there was an area of increased intake in the RUQ concerning for right renal pelvis bleed. CT abdomen and pelvis was ordered to further characterize possible right renal pelvis bleed. CT ruled out renal bleed but revealed a 3.7 cm lesion in the RLQ concerning for GI malignancy or fluid collection. Patient underwent an IR guided biopsy without any complications. Patient was restarted on Eliquis, hemoglobin was stable and patient denied any melena prior to discharge.   Issues for Follow Up:  1. Follow up CBC to monitor Hemoglobin 2. Evaluate for melena or BRBPR. 3. Follow up on GI recommendations 4. Consider switching to different anticoagulation regimen  Significant Procedures: 5/16 colonoscopy, Tagged RBC 5/16, IR guided biopsy  Significant Labs and Imaging:   Recent Labs Lab 03/31/17 0933 04/01/17 0542 04/02/17 0801  WBC 5.1 5.4 5.5  HGB 10.1* 9.3* 9.9*  HCT 32.8*  29.7* 32.0*  PLT 139* 121* 129*    Recent Labs Lab 03/28/17 0305 03/31/17 0933 04/01/17 0542 04/02/17 0801  NA 140 138 137 140  K 3.8 3.2* 3.3* 3.6  CL 108 104 103 104  CO2 25 26 28 29    GLUCOSE 103* 168* 155* 129*  BUN 21* 12 14 16   CREATININE 1.16* 1.33* 1.29* 1.13*  CALCIUM 8.7* 8.8* 8.5* 8.9   Ct Biopsy  Result Date: 04/01/2017 INDICATION: 76 year-old with an indeterminate lesion in the right lower quadrant of the abdomen. EXAM: CT-GUIDED BIOPSY OF ABDOMINAL/PERITONEAL  LESION MEDICATIONS: None. ANESTHESIA/SEDATION: Moderate (conscious) sedation was employed during this procedure. A total of Versed 2.0 mg and Fentanyl 100 mcg was administered intravenously. Moderate Sedation Time: 30 minutes. The patient's level of consciousness and vital signs were monitored continuously by radiology nursing throughout the procedure under my direct supervision. FLUOROSCOPY TIME:  None COMPLICATIONS: None immediate. PROCEDURE: Informed written consent was obtained from the patient after a thorough discussion of the procedural risks, benefits and alternatives. All questions were addressed. A timeout was performed prior to the initiation of the procedure. Patient was placed supine on the CT scanner. Images through the lower abdomen were obtained. The oval shaped lesion in the right lower quadrant was targeted. The right side of the abdomen was prepped with chlorhexidine and a sterile field was created. Skin and soft tissues were anesthetized with 1% lidocaine. 17 gauge coaxial needle was directed towards the lesion with CT guidance. Needle was placed along the periphery of the lesion. A total of 4 core biopsies were obtained with an 18 gauge core device. Needle removed without complication. Specimens placed in formalin. Bandage placed over the puncture site. FINDINGS: 3.5 cm lesion with central low density in the right lower quadrant. Needle was placed along the lateral aspect of the lesion and 4 core biopsies were obtained. It was difficult to sample the central aspect of the lesion with the 17 gauge coaxial needle because the lesion appeared to be pushing away from the needle. Small amount of gas within  the lesion following removal of the coaxial needle. Suspect this lesion has central necrosis or fluid. IMPRESSION: CT-guided core biopsy of the right lower quadrant abdominal lesion. Electronically Signed   By: Markus Daft M.D.   On: 04/01/2017 14:49    Results/Tests Pending at Time of Discharge: Surgical patholo Discharge Medications:  Allergies as of 04/02/2017      Reactions   Benazepril Other (See Comments)   Unknown reaction at age 81-65 - possibly dizziness   Angiotensin Receptor Blockers Other (See Comments)   Hypotension reaction   Fe-succ-c-thre-b12-des Stomach Other (See Comments)   Unknown reaction   Spironolactone Other (See Comments)   Dizziness   Sulfamethoxazole-trimethoprim Other (See Comments)   Unknown reaction      Medication List    TAKE these medications   apixaban 5 MG Tabs tablet Commonly known as:  ELIQUIS Take 1 tablet (5 mg total) by mouth 2 (two) times daily.   aspirin EC 81 MG tablet Take 1 tablet (81 mg total) by mouth daily.   atorvastatin 40 MG tablet Commonly known as:  LIPITOR Take 1 tablet (40 mg total) by mouth daily at 6 PM. What changed:  when to take this   carvedilol 6.25 MG tablet Commonly known as:  COREG Take 1 tablet (6.25 mg total) by mouth 2 (two) times daily with a meal.   cholecalciferol 1000 units tablet Commonly known as:  VITAMIN D Take  1,000 Units by mouth 2 (two) times daily.   docusate sodium 100 MG capsule Commonly known as:  COLACE Take 200 mg by mouth at bedtime.   furosemide 40 MG tablet Commonly known as:  LASIX Take 1 tablet (40 mg total) by mouth daily.   gabapentin 400 MG capsule Commonly known as:  NEURONTIN Take 1 capsule (400 mg total) by mouth at bedtime. What changed:  when to take this   insulin glargine 100 UNIT/ML injection Commonly known as:  LANTUS Inject 0.18 mLs (18 Units total) into the skin at bedtime. What changed:  how much to take   neomycin-bacitracin-polymyxin Oint Commonly known  as:  NEOSPORIN Apply 1 application topically 3 (three) times daily as needed for wound care.   nitroGLYCERIN 0.4 MG SL tablet Commonly known as:  NITROSTAT Place 0.4 mg under the tongue every 5 (five) minutes as needed for chest pain (up to 3 doses).   potassium chloride SA 20 MEQ tablet Commonly known as:  K-DUR,KLOR-CON Take 1 tablet (20 mEq total) by mouth daily.   rOPINIRole 0.5 MG tablet Commonly known as:  REQUIP Take 1 mg by mouth at bedtime.   silver sulfADIAZINE 1 % cream Commonly known as:  SILVADENE Apply 1 application topically 3 (three) times daily as needed (wound care).   traMADol 50 MG tablet Commonly known as:  ULTRAM Take 50-100 mg by mouth See admin instructions. Take 2 tablets (100 mg) by mouth every morning and 1 tablet (50 mg) at night   vitamin B-12 1000 MCG tablet Commonly known as:  CYANOCOBALAMIN Take 1,000 mcg by mouth daily.       Discharge Instructions: Please refer to Patient Instructions section of EMR for full details.  Patient was counseled important signs and symptoms that should prompt return to medical care, changes in medications, dietary instructions, activity restrictions, and follow up appointments.   Follow-Up Appointments: Follow-up Information    London Pepper, MD Follow up on 06/03/2017.   Specialty:  Family Medicine Why:  2 pm for hospital follow up and new patient, bring insurance card , meds photo id Contact information: [place] [address] [phone]        Ronnette Juniper, MD Follow up on 04/10/2017.   Specialty:  Gastroenterology Why:  Your appointment is at 10:15 am. please arrive 15 min early. Contact information: [facility] [place] [address] [phone]           Marjie Skiff, MD 04/03/2017, 3:36 PM PGY-3, [place]

## 2017-03-27 NOTE — Interval H&P Note (Signed)
History and Physical Interval Note:  Patient has been adequately prepped for the procedure, patient continues to have rectal bleeding and did not notice a change in the color for stool.  03/27/2017 9:39 AM  [name]  has presented today for colonoscopy, with the diagnosis of Hematochezia  The various methods of treatment have been discussed with the patient and family. After consideration of risks, benefits and other options for treatment, the patient has consented to  Procedure(s): COLONOSCOPY WITH PROPOFOL (Left) as a surgical intervention .  The patient's history has been reviewed, patient examined, no change in status, stable for surgery.  I have reviewed the patient's chart and labs.  Questions were answered to the patient's satisfaction.     Ronnette Juniper

## 2017-03-27 NOTE — Progress Notes (Signed)
Family Medicine Teaching Service Daily Progress Note Intern Pager: [phone]  Patient name: [name] Medical record number: [id number] Date of birth: 05-01-1941 Age: 76 y.o. Gender: female  Primary Care Provider: Patient, No Pcp Per Consultants: GI Code Status: DNI  Pt Overview and Major Events to Date:  5/14 - admit to FPTS   Assessment and Plan: [name] is a 76 y.o. female presenting with bloody bowel movements x3 and on Eliquis, last taken 5/14 am. PMH is significant for CAD with CABG 2009, ischemic cardiomyopathy, atrial fibrillation on Eliquis, systolic CHF (last echo 02/6269 EF 35-40% with diffuse hypokinesis and pulm HTN), HLD, DM, CKD, CVA.   Anemia: Thought to be due to lower GI bleed. Chronically anticoagulated on eliquis for afib, Eliquis currently being held. Hgb stable at 8.8 this AM, initially 7.5 prior to 2u PRBC on admission. GI planning for inpatient colonoscopy. - GI consulted, appreciate recs - if colonoscopy negative, GI considering EGD - CBCs q6H  - hold Eliquis in the setting of acute bleed - IV protonix  A.fib, rate controlled: on Eliquis at home, last anticoagulation 5/14 am. Hold any anticoagulation given acute GI.  - hold eliquis - restart after GI intervention or stabilization of bleeding - continue coreg for rate control  HFpEF, mildly hypervolemic:  Follows with Dr. Julianne Handler. Home meds coreg, ASA, statin, lasix 40 mg daily. Patient reports this is baseline volume status. Lungs clear, breathing at baseline. BNP 149.9. Cr 1.07 - continue home lasix - monitor AM BMP for Cr  Diabetes - Hba1c 6.5 this admission. patient reports taking 23U Lantus QHS at home. CBG 116, 111, 87 overnight, received 8u long acting and 1u short acting insulin overnight - sensitive sliding scale - continue current dec dose of insulin while clears/ NPO titrate up as needed  HTN: patient takes coreg, lasix at home.  - continue coreg  CAD/HLD: CABG 2009, ischemic  cardiomyopathy. Patient follows with Dr. Angelena Form outpatient.  - no ARB as an outpatient due to h/o hypotension - continue lipitor  CKD3: follows with [facility] Dr. Audie Clear. Baseline in our system appears to be between 1.18 and 1.7.  -monitor on daily BMP -avoid nephrotoxic agents  FEN/GI: Clears today, NPO at MN, IV PPI  Prophylaxis: SCDs   Disposition: admit to telemetry  Subjective:  Patient endorses one bloody bowel movement last night and one bloody BM this AM.  No abdominal pain. Agreeable to colonoscopy today.  Objective: Temp:  [97.5 F (36.4 C)-98.6 F (37 C)] 98.6 F (37 C) (05/16 0906) Pulse Rate:  [59-84] 72 (05/16 0810) Resp:  [12-23] 12 (05/16 0906) BP: (99-175)/(45-82) 175/48 (05/16 0906) SpO2:  [98 %-100 %] 100 % (05/16 0906) Weight:  [135.2 kg (298 lb)-135.5 kg (298 lb 11.2 oz)] 135.2 kg (298 lb) (05/16 0906) Physical Exam: General: NAD, rests comfortably in bed Cardiovascular: Irregular rhythm, rate regular no m/r/g Respiratory: CTA bil no W/R/R, moving air throughout, lung sounds distant with body habitus Abdomen: soft and nontender and nondistended Extremities: 2+ LE edema bil  Laboratory:  Recent Labs Lab 03/26/17 0430 03/26/17 1540 03/27/17 0323  WBC 5.7 6.6 7.1  HGB 8.9* 9.1* 8.8*  HCT 27.4* 28.3* 28.2*  PLT 125* 121* 121*    Recent Labs Lab 03/25/17 1620 03/26/17 0430 03/27/17 0323  NA 142 141 139  K 3.9 3.7 3.6  CL 106 107 105  CO2 26 26 24   BUN 36* 31* 26*  CREATININE 1.31* 1.07* 1.07*  CALCIUM 8.7* 8.6* 8.5*  PROT 6.4*  --   --   BILITOT 0.8  --   --   ALKPHOS 99  --   --   ALT 14  --   --   AST 27  --   --   GLUCOSE 250* 116* 108*    Imaging/Diagnostic Tests: No results found.   Everrett Coombe, MD 03/27/2017, 9:40 AM PGY-1, [place] Intern pager: [phone], text pages welcome

## 2017-03-27 NOTE — H&P (View-Only) (Signed)
[facility] Consult  Referring Provider: Dorie Rank, MD Primary Care Physician:  Braulio Conte, FNP Primary Gastroenterologist: Dr.Timothy Misenheimer([place], [place])  Reason for Consultation:  Hematochezia  HPI: [name] is a 76 y.o. Caucasian female presented to the ER on 03/25/2017 with painless rectal bleeding. Patient was in his usual state of health until Friday evening, 5 days ago, when she noticed bright red blood on wiping. She noticed the same thing next evening, however, the amount of bleeding was slightly more. On Sunday, she noticed that she had several episodes of bright red blood per rectum, as  it continued yesterday (Monday), she decided to proceed to the ER. Patient takes Eliquis twice a day for stroke associated with atrial fibrillation and stopped Eliquis yesterday evening (last dose yesterday morning in a.m.). Patient reports that last colonoscopy was performed 3 years ago in [place] by Dr.Misenheimer, she had 9 polyps removed, and was supposed to repeat a colonoscopy for surveillance within a year, as one polyp was reported as large. However, she developed a stroke associated with weakness in movement and due to her age, it was decided that she should not get any further surveillance colonoscopies. Patient denies any recent unintentional weight loss, loss of appetite, or past bleeding episodes. She also denies acid reflux, difficulty in swallowing, pain on swallowing, early satiety or bloating. His family history of colon cancer. Patient denies any chest pain, shortness of breath, dizziness or loss of consciousness. Normally, she has a bowel movement every day or every other day, as she takes prunes and stool softeners on a regular basis.   Past Medical History:  Diagnosis Date  . ACC/AHA stage C congestive heart failure due to ischemic cardiomyopathy ([place]) 01/31/2009   Qualifier: Diagnosis of  By: Olevia Perches, MD, Glenetta Hew   . Acute CVA  (cerebrovascular accident) ([place]) 03/31/2016  . Acute hemorrhagic infarction of brain ([place])   . Acute kidney injury ([place]) 03/26/2017  . ANXIETY 01/31/2009   Qualifier: Diagnosis of  By: Lovette Cliche, CNA, Christy    . Arthritis    "qwhere" (03/30/2016)  . Atrial fibrillation ([place])    Postoperative in 2009;  S/P transesophageal echocardiography-guided cardioversion, previously on Amiodarone and Coumadin; recurrent in April 2013  . Benign essential HTN   . CAD (coronary artery disease) of artery bypass graft 05/2008   S/P CABG in 2009  . Cerebrovascular accident (CVA) due to embolism of right middle cerebral artery ([place])   . CHF (congestive heart failure) ([place])   . Chronic anticoagulation   . Chronic kidney disease (CKD), stage III (moderate)   . Chronic lower back pain   . Chronic pain syndrome   . CKD (chronic kidney disease) stage 3, GFR 30-59 ml/min 03/30/2016  . Coronary artery disease involving coronary bypass graft of native heart without angina pectoris   . DM type 2 with diabetic peripheral neuropathy ([place])   . Facial weakness, post-stroke   . Gastrointestinal hemorrhage 03/26/2017  . Heart murmur   . Hemiparesis ([place])   . History of CVA with residual deficit 03/26/2017  . Hyperlipidemia   . Hypertension   . Ischemic cardiomyopathy   . Morbid obesity ([place]) 03/26/2017  . Obesities, morbid ([place])   . Persistent atrial fibrillation ([place])   . Right middle cerebral artery stroke ([place]) 04/04/2016  . Stroke ([place])   . Systolic heart failure   . TACHYCARDIA, HX OF 01/31/2009   Qualifier: Diagnosis of  By: Lovette Cliche, CNA, Christy    . Tachypnea   .  Thrombocytopenia ([place])   . TIA (transient ischemic attack) 03/30/2016  . Type II diabetes mellitus ([place])   . UTI (urinary tract infection) 04/01/2016    Past Surgical History:  Procedure Laterality Date  . APPENDECTOMY  1946  . CATARACT EXTRACTION Right 2012  . CORONARY ANGIOPLASTY WITH STENT PLACEMENT  2009   "put 2 stents in"  . HERNIA REPAIR    .  JOINT REPLACEMENT    . TEE WITH CARDIOVERSION     Postoperative in 2009;  S/P transesophageal echocardiography-guided cardioversion,  . TOTAL KNEE ARTHROPLASTY Right 1994  . VENTRAL HERNIA REPAIR  10/1999   With mesh    Prior to Admission medications   Medication Sig Start Date End Date Taking? Authorizing Provider  apixaban (ELIQUIS) 5 MG TABS tablet Take 1 tablet (5 mg total) by mouth 2 (two) times daily. 04/26/16  Yes Kirsteins, Luanna Salk, MD  aspirin EC 81 MG tablet Take 1 tablet (81 mg total) by mouth daily. 08/27/16  Yes Burnell Blanks, MD  atorvastatin (LIPITOR) 40 MG tablet Take 1 tablet (40 mg total) by mouth daily at 6 PM. Patient taking differently: Take 40 mg by mouth at bedtime.  04/12/16  Yes Angiulli, Lavon Paganini, PA-C  carvedilol (COREG) 6.25 MG tablet Take 1 tablet (6.25 mg total) by mouth 2 (two) times daily with a meal. 04/12/16  Yes Angiulli, Lavon Paganini, PA-C  cholecalciferol (VITAMIN D) 1000 UNITS tablet Take 1,000 Units by mouth 2 (two) times daily.   Yes [provider]  docusate sodium (COLACE) 100 MG capsule Take 200 mg by mouth at bedtime.    Yes [provider]  furosemide (LASIX) 40 MG tablet Take 1 tablet (40 mg total) by mouth daily. 04/12/16  Yes Angiulli, Lavon Paganini, PA-C  gabapentin (NEURONTIN) 400 MG capsule Take 1 capsule (400 mg total) by mouth at bedtime. Patient taking differently: Take 400 mg by mouth 2 (two) times daily.  04/12/16  Yes Angiulli, Lavon Paganini, PA-C  insulin glargine (LANTUS) 100 UNIT/ML injection Inject 0.18 mLs (18 Units total) into the skin at bedtime. Patient taking differently: Inject 23 Units into the skin at bedtime.  04/12/16  Yes Angiulli, Lavon Paganini, PA-C  neomycin-bacitracin-polymyxin (NEOSPORIN) OINT Apply 1 application topically 3 (three) times daily as needed for wound care.   Yes [provider]  nitroGLYCERIN (NITROSTAT) 0.4 MG SL tablet Place 0.4 mg under the tongue every 5 (five) minutes as needed for chest  pain (up to 3 doses).    Yes [provider]  potassium chloride SA (K-DUR,KLOR-CON) 20 MEQ tablet Take 1 tablet (20 mEq total) by mouth daily. 04/12/16  Yes Angiulli, Lavon Paganini, PA-C  rOPINIRole (REQUIP) 0.5 MG tablet Take 1 mg by mouth at bedtime. 02/04/17  Yes [provider]  silver sulfADIAZINE (SILVADENE) 1 % cream Apply 1 application topically 3 (three) times daily as needed (wound care).   Yes [provider]  traMADol (ULTRAM) 50 MG tablet Take 50-100 mg by mouth See admin instructions. Take 2 tablets (100 mg) by mouth every morning and 1 tablet (50 mg) at night   Yes [provider]  vitamin B-12 (CYANOCOBALAMIN) 1000 MCG tablet Take 1,000 mcg by mouth daily.   Yes [provider]    Current Facility-Administered Medications  Medication Dose Route Frequency Provider Last Rate Last Dose  . acetaminophen (TYLENOL) tablet 650 mg  650 mg Oral Q6H PRN Sela Hilding, MD       Or  . acetaminophen (TYLENOL)  suppository 650 mg  650 mg Rectal Q6H PRN Sela Hilding, MD      . atorvastatin (LIPITOR) tablet 40 mg  40 mg Oral q1800 Sela Hilding, MD      . carvedilol (COREG) tablet 6.25 mg  6.25 mg Oral BID WC Sela Hilding, MD      . gabapentin (NEURONTIN) capsule 400 mg  400 mg Oral BID Sela Hilding, MD   400 mg at 03/25/17 2206  . insulin aspart (novoLOG) injection 0-9 Units  0-9 Units Subcutaneous Q4H Sela Hilding, MD   1 Units at 03/26/17 0414  . insulin glargine (LANTUS) injection 8 Units  8 Units Subcutaneous QHS Sela Hilding, MD   8 Units at 03/26/17 0155  . potassium chloride SA (K-DUR,KLOR-CON) CR tablet 20 mEq  20 mEq Oral Daily Sela Hilding, MD      . rOPINIRole (REQUIP) tablet 1 mg  1 mg Oral QHS Sela Hilding, MD      . sodium chloride flush (NS) 0.9 % injection 3 mL  3 mL Intravenous Q12H Sela Hilding, MD   3 mL at 03/25/17 2219  . vitamin B-12 (CYANOCOBALAMIN) tablet 1,000 mcg   1,000 mcg Oral Daily Sela Hilding, MD       Current Outpatient Prescriptions  Medication Sig Dispense Refill  . apixaban (ELIQUIS) 5 MG TABS tablet Take 1 tablet (5 mg total) by mouth 2 (two) times daily. 60 tablet 11  . aspirin EC 81 MG tablet Take 1 tablet (81 mg total) by mouth daily. 90 tablet 3  . atorvastatin (LIPITOR) 40 MG tablet Take 1 tablet (40 mg total) by mouth daily at 6 PM. (Patient taking differently: Take 40 mg by mouth at bedtime. ) 30 tablet 0  . carvedilol (COREG) 6.25 MG tablet Take 1 tablet (6.25 mg total) by mouth 2 (two) times daily with a meal. 60 tablet 6  . cholecalciferol (VITAMIN D) 1000 UNITS tablet Take 1,000 Units by mouth 2 (two) times daily.    Marland Kitchen docusate sodium (COLACE) 100 MG capsule Take 200 mg by mouth at bedtime.     . furosemide (LASIX) 40 MG tablet Take 1 tablet (40 mg total) by mouth daily. 30 tablet 0  . gabapentin (NEURONTIN) 400 MG capsule Take 1 capsule (400 mg total) by mouth at bedtime. (Patient taking differently: Take 400 mg by mouth 2 (two) times daily. ) 30 capsule 1  . insulin glargine (LANTUS) 100 UNIT/ML injection Inject 0.18 mLs (18 Units total) into the skin at bedtime. (Patient taking differently: Inject 23 Units into the skin at bedtime. ) 10 mL 11  . neomycin-bacitracin-polymyxin (NEOSPORIN) OINT Apply 1 application topically 3 (three) times daily as needed for wound care.    . nitroGLYCERIN (NITROSTAT) 0.4 MG SL tablet Place 0.4 mg under the tongue every 5 (five) minutes as needed for chest pain (up to 3 doses).     . potassium chloride SA (K-DUR,KLOR-CON) 20 MEQ tablet Take 1 tablet (20 mEq total) by mouth daily. 30 tablet 1  . rOPINIRole (REQUIP) 0.5 MG tablet Take 1 mg by mouth at bedtime.    . silver sulfADIAZINE (SILVADENE) 1 % cream Apply 1 application topically 3 (three) times daily as needed (wound care).    . traMADol (ULTRAM) 50 MG tablet Take 50-100 mg by mouth See admin instructions. Take 2 tablets (100 mg) by mouth  every morning and 1 tablet (50 mg) at night    . vitamin B-12 (CYANOCOBALAMIN) 1000 MCG tablet Take 1,000 mcg by mouth  daily.      Allergies as of 03/25/2017 - Review Complete 03/25/2017  Allergen Reaction Noted  . Benazepril Other (See Comments) 03/30/2016  . Fe-succ-c-thre-b12-des stomach Other (See Comments) 05/04/2009  . Spironolactone Other (See Comments) 05/04/2009  . Sulfamethoxazole-trimethoprim Other (See Comments) 05/04/2009    Family History  Problem Relation Age of Onset  . Clotting disorder Mother        Cerebral hemorrhage  . Emphysema Father        COD  . Lung cancer Brother   . Heart disease Neg Hx     Social History   Social History  . Marital status: Widowed    Spouse name: N/A  . Number of children: N/A  . Years of education: N/A   Occupational History  . Not on file.   Social History Main Topics  . Smoking status: Never Smoker  . Smokeless tobacco: Never Used  . Alcohol use No  . Drug use: No  . Sexual activity: Not Currently   Other Topics Concern  . Not on file   Social History Narrative   Widowed   Lives alone in [place]   2 children   Not routinely exercising    Review of Systems: As per history of present illness GI: Described in detail in HPI.    Gen: Denies any fever, chills, rigors, night sweats, anorexia, fatigue, weakness, malaise, involuntary weight loss, and sleep disorder CV: Denies chest pain, angina, palpitations, syncope, orthopnea, PND, peripheral edema, and claudication. Resp: Denies dyspnea, cough, sputum, wheezing, coughing up blood. GU : Denies urinary burning, blood in urine, urinary frequency, urinary hesitancy, nocturnal urination, and urinary incontinence. MS: Denies joint pain or swelling.  Denies muscle weakness, cramps, atrophy. She uses a walker to ambulate Derm: Denies rash, itching, oral ulcerations, hives, unhealing ulcers.  Psych: Denies depression, anxiety, memory loss, suicidal ideation, hallucinations,   and confusion. Heme: Denies bruising, bleeding, and enlarged lymph nodes. Neuro:  Denies any headaches, dizziness, paresthesias. Endo:  Denies any problems with DM, thyroid, adrenal function.  Physical Exam: Vital signs in last 24 hours: Temp:  [98 F (36.7 C)-98.5 F (36.9 C)] 98.3 F (36.8 C) (05/15 0300) Pulse Rate:  [55-83] 73 (05/15 0745) Resp:  [13-23] 20 (05/15 0400) BP: (99-147)/(35-116) 99/87 (05/15 0745) SpO2:  [94 %-100 %] 100 % (05/15 0745)    General:   Alert,  Well-developed, obese, pleasant and cooperative in NAD Head:  Normocephalic and atraumatic. Eyes:  Sclera clear, no icterus.   Conjunctiva pale Ears:  Normal auditory acuity. Nose:  No deformity, discharge,  or lesions. Mouth:  No deformity or lesions.  Oropharynx pink & moist. Neck:  Supple; no masses or thyromegaly. Lungs:  Clear throughout to auscultation.   No wheezes, crackles, or rhonchi. No acute distress. Heart:  Irregular rate and rhythm; no murmurs, clicks, rubs,  or gallops. Extremities:  Without clubbing or edema, chronic skin changes of lower extremities bilaterally Neurologic:  Alert and  oriented x4;  grossly normal neurologically. Skin:  Intact without significant lesions or rashes. Psych:  Alert and cooperative. Normal mood and affect. Abdomen:  Soft, nontender and nondistended. No masses, hepatosplenomegaly or hernias noted. Normal bowel sounds, without guarding, and without rebound.         Lab Results:  Recent Labs  03/25/17 1620 03/26/17 0430  WBC 5.4 5.7  HGB 7.5* 8.9*  HCT 24.0* 27.4*  PLT 145* 125*   BMET  Recent Labs  03/25/17 1620 03/26/17 0430  NA 142 141  K 3.9 3.7  CL 106 107  CO2 26 26  GLUCOSE 250* 116*  BUN 36* 31*  CREATININE 1.31* 1.07*  CALCIUM 8.7* 8.6*   LFT  Recent Labs  03/25/17 1620  PROT 6.4*  ALBUMIN 3.3*  AST 27  ALT 14  ALKPHOS 99  BILITOT 0.8   PT/INR  Recent Labs  03/25/17 2024 03/26/17 0430  LABPROT 19.6* 18.7*  INR 1.64  1.55    Studies/Results: No results found.  Impression: Painless rectal bleeding, elevated BUN/creatinine ratio Anemia, status post 2 units blood transfusion Atrial fibrillation on Eliquis (currently on hold) Coronary artery disease, status post CABG History of Stroke  Plan: Patient remains hemodynamically stable, plan to start the patient on clear liquid diet and keep her nothing by mouth post midnight for diagnostic colonoscopy to be performed in a.m. The etiology for painless hematochezia is likely diverticular bleeding. Other etiologies to consider include AV malformations and less likely malignancy. As the BUN is elevated in proportion to creatinine this may signify a right colonic bleed. If colonoscopy is unremarkable, an EGD has to be considered to rule out an upper GI source of bleeding such as peptic ulcer disease. Upper GI bleeding is less likely as patient has remained hemodynamically stable.   LOS: 1 day   Ronnette Juniper, M.D.  03/26/2017, 8:15 AM  Pager [phone] If no answer or after 5 PM call [phone]

## 2017-03-27 NOTE — Transfer of Care (Signed)
Immediate Anesthesia Transfer of Care Note  Patient: [name]  Procedure(s) Performed: Procedure(s): COLONOSCOPY WITH PROPOFOL (Left)  Patient Location: Endoscopy Unit  Anesthesia Type:MAC  Level of Consciousness: awake, alert  and sedated  Airway & Oxygen Therapy: Patient connected to nasal cannula oxygen  Post-op Assessment: Post -op Vital signs reviewed and stable  Post vital signs: stable  Last Vitals:  Vitals:   03/27/17 0810 03/27/17 0906  BP: 127/70 (!) 175/48  Pulse: 72   Resp: 17 12  Temp: 36.8 C 37 C    Last Pain:  Vitals:   03/27/17 0906  TempSrc: Oral  PainSc:       Patients Stated Pain Goal: 1 (24/81/85 9093)  Complications: No apparent anesthesia complications

## 2017-03-27 NOTE — Progress Notes (Signed)
Notified dietary of patient's return from procedure.  Clear tray to be delivered.

## 2017-03-27 NOTE — Op Note (Signed)
Colonoscopy showed blood and clots in the entire colon. Some diverticulosis was noted in the sigmoid colon. The clinical picture and colonoscopy findings are compatible with a diverticular bleeding. Recommend a bleeding scan today. If no active bleeding noted, patient on clear liquid diet. Monitor H&H, stable in a.m. okay to start high fiber diet and DC in a.m. If active bleeding was noted on bleeding scan, management will change accordingly. As the prep not adequate, recommend colonoscopy for screening purposes to be performed as an outpatient in 6 months if general condition permits. Recommend holding Eliquis for another 3 days.  Ronnette Juniper, M.D. [phone]

## 2017-03-27 NOTE — Progress Notes (Signed)
Pt finished the entire 4L course of Go[facility]ely. Completed at [facility]. Stool still dark red at time of completion. Will continue to monitor.

## 2017-03-28 ENCOUNTER — Inpatient Hospital Stay (HOSPITAL_COMMUNITY): Payer: Medicare Other

## 2017-03-28 ENCOUNTER — Encounter (HOSPITAL_COMMUNITY): Payer: Self-pay | Admitting: Radiology

## 2017-03-28 LAB — BASIC METABOLIC PANEL
Anion gap: 7 (ref 5–15)
BUN: 21 mg/dL — ABNORMAL HIGH (ref 6–20)
CALCIUM: 8.7 mg/dL — AB (ref 8.9–10.3)
CO2: 25 mmol/L (ref 22–32)
Chloride: 108 mmol/L (ref 101–111)
Creatinine, Ser: 1.16 mg/dL — ABNORMAL HIGH (ref 0.44–1.00)
GFR, EST AFRICAN AMERICAN: 51 mL/min — AB (ref >60)
GFR, EST NON AFRICAN AMERICAN: 44 mL/min — AB (ref >60)
Glucose, Bld: 103 mg/dL — ABNORMAL HIGH (ref 65–99)
POTASSIUM: 3.8 mmol/L (ref 3.5–5.1)
Sodium: 140 mmol/L (ref 135–145)

## 2017-03-28 LAB — GLUCOSE, CAPILLARY
GLUCOSE-CAPILLARY: 96 mg/dL (ref 65–99)
GLUCOSE-CAPILLARY: 118 mg/dL — AB (ref 65–99)
GLUCOSE-CAPILLARY: 106 mg/dL — AB (ref 65–99)
GLUCOSE-CAPILLARY: 137 mg/dL — AB (ref 65–99)
Glucose-Capillary: 118 mg/dL — ABNORMAL HIGH (ref 65–99)
Glucose-Capillary: 84 mg/dL (ref 65–99)

## 2017-03-28 LAB — CBC
HEMATOCRIT: 24.2 % — AB (ref 36.0–46.0)
Hemoglobin: 7.6 g/dL — ABNORMAL LOW (ref 12.0–15.0)
MCH: 28 pg (ref 26.0–34.0)
MCHC: 31.4 g/dL (ref 30.0–36.0)
MCV: 89.3 fL (ref 78.0–100.0)
PLATELETS: 116 10*3/uL — AB (ref 150–400)
RBC: 2.71 MIL/uL — AB (ref 3.87–5.11)
RDW: 15.3 % (ref 11.5–15.5)
WBC: 5.4 10*3/uL (ref 4.0–10.5)

## 2017-03-28 LAB — PREPARE RBC (CROSSMATCH)

## 2017-03-28 MED ORDER — SODIUM CHLORIDE 0.9 % IV SOLN: Freq: Once | INTRAVENOUS | Status: AC

## 2017-03-28 MED ORDER — IOPAMIDOL (ISOVUE-300) INJECTION 61%
INTRAVENOUS | Status: AC
  Administered 2017-03-28: 75 mL
  Filled 2017-03-28: qty 75

## 2017-03-28 MED ORDER — SODIUM CHLORIDE 0.9 % IV SOLN
Freq: Once | INTRAVENOUS | Status: AC
  Administered 2017-03-28: 19:00:00 via INTRAVENOUS

## 2017-03-28 MED ORDER — IOPAMIDOL (ISOVUE-300) INJECTION 61%
INTRAVENOUS | Status: AC
  Administered 2017-03-28: 30 mL
  Filled 2017-03-28: qty 30

## 2017-03-28 NOTE — Anesthesia Postprocedure Evaluation (Signed)
Anesthesia Post Note  Patient: [name]  Procedure(s) Performed: Procedure(s) (LRB): COLONOSCOPY WITH PROPOFOL (Left)  Patient location during evaluation: PACU Anesthesia Type: MAC Level of consciousness: awake and alert Pain management: pain level controlled Vital Signs Assessment: post-procedure vital signs reviewed and stable Respiratory status: spontaneous breathing, nonlabored ventilation, respiratory function stable and patient connected to nasal cannula oxygen Cardiovascular status: stable and blood pressure returned to baseline Anesthetic complications: no       Last Vitals:  Vitals:   03/28/17 0014 03/28/17 0331  BP: (!) 118/48 (!) 125/57  Pulse: 72 80  Resp: 19 16  Temp: 36.8 C 36.7 C    Last Pain:  Vitals:   03/28/17 0331  TempSrc: Oral  PainSc:                  Riccardo Dubin

## 2017-03-28 NOTE — Progress Notes (Signed)
Family Medicine Teaching Service Daily Progress Note Intern Pager: [phone]  Patient name: [name] Medical record number: [id number] Date of birth: 1940/12/17 Age: 76 y.o. Gender: female  Primary Care Provider: Patient, No Pcp Per Consultants: GI Code Status: DNI  Assessment and Plan: [name] a 76 y.o.femalepresenting with bloody bowel movements x3 and on Eliquis, last taken 5/14 am. PMH is significant for CAD with CABG 2009, ischemic cardiomyopathy, atrial fibrillation on Eliquis, systolic CHF (last echo 0/6237 EF 35-40% with diffuse hypokinesis and pulm HTN), HLD, DM, CKD, CVA.   #Anemia most likely 2/2 GI bleed, ongoing . Patient had a colonoscopy which showed blood throughout the entire colon compatible with diverticular disease. Follow up tagged RBCs procedure showed a focal area of increase update around the right kidney and right renal pelvis. Given patient Hemoglobin is 7.6 and she has a history of CAD with CABG and atrial fibrillation, patient should transfuse. Will follow up on additional imaging to rule in or out possible renal bleed. --Will order 2u of pRBCs for transfusion --Follow on H/H --Continue to hold Eliquis for 2 more days per GI recs --Coninue protonix  #A.fib, rate controlled Patient was on Eliquis at home, last anticoagulation 5/14 am. Holding in the setting of acute GI.  --Continue to hold Eliquis for two more days per GI recs  --Will follow up on H/H and consult GI prior to restarting --Continue Carvedilol 6.25 mg bid  #HFpEF, mildly hypervolemic Patient is seeing Dr. Julianne Handler. Home meds coreg, ASA, statin, lasix 40 mg daily. Patient reports this is baseline volume status. Lungs clear, breathing at baseline. BNP 149.9. Cr 1.07. --Continue Lasix 40 mg daily  --Follow on Electrolytes   #Diabetes  Hba1c 6.5 this admission. patient reports taking 23U Lantus QHS at home. CBG 116, 111, 87 overnight, received 8u long acting and 1u short acting  insulin overnight --Continue SSI --Continue current dec dose of insulin while clears/ NPO titrate up as needed  #HTN BP 124/53. Continue to monitor.  --Continue Carvedilol 6.25 mg bid  #CAD/HLD CABG 2009, ischemic cardiomyopathy. Patient follows with Dr. Angelena Form outpatient.  - no ARB as an outpatient due to h/o hypotension - continue lipitor  #CKD3 Followed with [facility] Dr. Audie Clear. Baseline in our system appears to be between 1.18 and 1.7.  --Follow up on creatinine --Avoid nephrotoxic agents  FEN/GI: PPI PPx: None  Disposition: Will follow up on GI bleed post transfusion and results from CT prior to considering d/c  Subjective:  Patient is currently stable and asymptomatic she denies dizziness, SOB, nausea and vomiting.  Objective: Temp:  [97.7 F (36.5 C)-98.6 F (37 C)] 98.1 F (36.7 C) (05/17 0331) Pulse Rate:  [65-87] 80 (05/17 0331) Resp:  [12-25] 16 (05/17 0331) BP: (99-175)/(48-75) 125/57 (05/17 0331) SpO2:  [96 %-100 %] 96 % (05/17 0331) Weight:  [298 lb (135.2 kg)] 298 lb (135.2 kg) (05/16 0906)  Physical Exam: General: NAD, pleasant, able to participate in exam Cardiac: RRR, normal heart sounds, no murmurs. 2+ radial and PT pulses bilaterally Respiratory: CTAB, normal effort, No wheezes, rales or rhonchi Abdomen: soft, nontender, nondistended, no hepatic or splenomegaly, +BS Extremities: no edema or cyanosis. WWP. Skin: warm and dry, no rashes noted Neuro: alert and oriented x4, no focal deficits Psych: Normal affect and mood  Laboratory:  Recent Labs Lab 03/27/17 0323 03/27/17 2103 03/28/17 0305  WBC 7.1 6.7 5.4  HGB 8.8* 8.9* 7.6*  HCT 28.2* 27.3* 24.2*  PLT 121* 122* 116*  Recent Labs Lab 03/25/17 1620 03/26/17 0430 03/27/17 0323 03/28/17 0305  NA 142 141 139 140  K 3.9 3.7 3.6 3.8  CL 106 107 105 108  CO2 26 26 24 25   BUN 36* 31* 26* 21*  CREATININE 1.31* 1.07* 1.07* 1.16*  CALCIUM 8.7* 8.6* 8.5* 8.7*  PROT  6.4*  --   --   --   BILITOT 0.8  --   --   --   ALKPHOS 99  --   --   --   ALT 14  --   --   --   AST 27  --   --   --   GLUCOSE 250* 116* 108* 103*    Imaging/Diagnostic Tests: Nm Gi Blood Loss  Result Date: 03/27/2017 CLINICAL DATA:  Pain this rectal bleeding. Colonoscopy showed blood clots in the entire colon with some diverticulosis. EXAM: NUCLEAR MEDICINE GASTROINTESTINAL BLEEDING SCAN TECHNIQUE: Sequential abdominal images were obtained following intravenous administration of Tc-66m labeled red blood cells. RADIOPHARMACEUTICALS:  25.0 mCi Tc-62m in-vitro labeled red cells. COMPARISON:  None. FINDINGS: Imaging was conducted over 2 hours. There is a focal area of progressive increased radiotracer activity in the right upper quadrant, which projects over the expected location of the right renal pelvis. This does not progressed distally as expected for the source of GI bleeding. The exact etiology of this focus of radiotracer accumulation is unclear over the past evaluated in conjunction with an abdomen and pelvis CT scan. There are no other areas of abnormal radiotracer accumulation. IMPRESSION: 1. There is a focal area of progressive increased radiotracer activity within the right mid to upper quadrant, that may correspond to the right kidney in the right renal pelvis. This is felt unlikely to reflect an area of GI bleeding, since it does not progressed distally. It is of unclear etiology, however. Recommend followup abdomen and pelvis CT with contrast to correlate with this exam. 2. No other areas of abnormal radiotracer accumulation. Electronically Signed   By: Lajean Manes M.D.   On: 03/27/2017 20:44    Marjie Skiff, MD 03/28/2017, 6:51 AM PGY-1, [place] Intern pager: [phone], text pages welcome

## 2017-03-28 NOTE — Progress Notes (Signed)
Nutrition Brief Note  Patient identified on the Malnutrition Screening Tool (MST) Report.  Wt Readings from Last 15 Encounters:  03/27/17 298 lb (135.2 kg)  10/22/16 276 lb (125.2 kg)  08/23/16 281 lb 12.8 oz (127.8 kg)  06/21/16 284 lb 12.8 oz (129.2 kg)  05/22/16 279 lb (126.6 kg)  04/12/16 (!) 308 lb 3.3 oz (139.8 kg)  03/30/16 300 lb (136.1 kg)  10/14/15 (!) 323 lb 3.2 oz (146.6 kg)  09/17/14 (!) 301 lb 12.8 oz (136.9 kg)  08/27/13 (!) 303 lb 12.8 oz (137.8 kg)  12/17/12 (!) 305 lb (138.3 kg)  08/25/12 (!) 306 lb 1.9 oz (138.9 kg)  02/25/12 297 lb (134.7 kg)  07/03/11 (!) 304 lb (137.9 kg)  11/10/10 (!) 324 lb (147 kg)   Body mass index is 42.76 kg/m. Patient meets criteria for Obesity Class III based on current BMI.   Current diet order is Clear Liquids.  Pt s/p colonoscopy.  Labs and medications reviewed.   No nutrition interventions warranted at this time. If nutrition issues arise, please consult RD.   Arthur Holms, RD, LDN Pager #: [phone] After-Hours Pager #: [phone]

## 2017-03-28 NOTE — Progress Notes (Signed)
Subjective: Patient was seen and examined at bedside. She reports 1 bowel movement today and states it was not bloody. She is going for CAT scan of the abdomen today for abnormality noted in the right kidney and right renal pelvis. Reports of bleeding scan noted for no active bleeding.  Objective: Vital signs in last 24 hours: Temp:  [98.1 F (36.7 C)-98.2 F (36.8 C)] 98.1 F (36.7 C) (05/17 0800) Pulse Rate:  [63-80] 63 (05/17 0800) Resp:  [16-20] 18 (05/17 0800) BP: (107-145)/(48-74) 134/60 (05/17 0800) SpO2:  [96 %-100 %] 98 % (05/17 0800) Weight change: -0.318 kg (-11.2 oz) Last BM Date: 03/27/17  PE: Sitting at bedside, not in acute distress GENERAL: Obese, mild pallor ABDOMEN: Soft, nontender, nondistended normoactive bowel sounds EXTREMITIES: No clubbing, no deformity  Lab Results: Results for orders placed or performed during the hospital encounter of 03/25/17 (from the past 48 hour(s))  CBC     Status: Abnormal   Collection Time: 03/26/17  3:40 PM  Result Value Ref Range   WBC 6.6 4.0 - 10.5 K/uL   RBC 3.15 (L) 3.87 - 5.11 MIL/uL   Hemoglobin 9.1 (L) 12.0 - 15.0 g/dL   HCT 28.3 (L) 36.0 - 46.0 %   MCV 89.8 78.0 - 100.0 fL   MCH 28.9 26.0 - 34.0 pg   MCHC 32.2 30.0 - 36.0 g/dL   RDW 15.6 (H) 11.5 - 15.5 %   Platelets 121 (L) 150 - 400 K/uL  CBG monitoring, ED     Status: Abnormal   Collection Time: 03/26/17  4:12 PM  Result Value Ref Range   Glucose-Capillary 113 (H) 65 - 99 mg/dL  MRSA PCR Screening     Status: None   Collection Time: 03/26/17  5:14 PM  Result Value Ref Range   MRSA by PCR NEGATIVE NEGATIVE    Comment:        The GeneXpert MRSA Assay (FDA approved for NASAL specimens only), is one component of a comprehensive MRSA colonization surveillance program. It is not intended to diagnose MRSA infection nor to guide or monitor treatment for MRSA infections.   Glucose, capillary     Status: Abnormal   Collection Time: 03/26/17  8:06 PM  Result  Value Ref Range   Glucose-Capillary 129 (H) 65 - 99 mg/dL  Glucose, capillary     Status: Abnormal   Collection Time: 03/26/17 10:40 PM  Result Value Ref Range   Glucose-Capillary 116 (H) 65 - 99 mg/dL  CBC     Status: Abnormal   Collection Time: 03/27/17  3:23 AM  Result Value Ref Range   WBC 7.1 4.0 - 10.5 K/uL   RBC 3.14 (L) 3.87 - 5.11 MIL/uL   Hemoglobin 8.8 (L) 12.0 - 15.0 g/dL   HCT 28.2 (L) 36.0 - 46.0 %   MCV 89.8 78.0 - 100.0 fL   MCH 28.0 26.0 - 34.0 pg   MCHC 31.2 30.0 - 36.0 g/dL   RDW 15.2 11.5 - 15.5 %   Platelets 121 (L) 150 - 400 K/uL  Basic metabolic panel     Status: Abnormal   Collection Time: 03/27/17  3:23 AM  Result Value Ref Range   Sodium 139 135 - 145 mmol/L   Potassium 3.6 3.5 - 5.1 mmol/L   Chloride 105 101 - 111 mmol/L   CO2 24 22 - 32 mmol/L   Glucose, Bld 108 (H) 65 - 99 mg/dL   BUN 26 (H) 6 - 20 mg/dL   Creatinine,  Ser 1.07 (H) 0.44 - 1.00 mg/dL   Calcium 8.5 (L) 8.9 - 10.3 mg/dL   GFR calc non Af Amer 48 (L) >60 mL/min   GFR calc Af Amer 56 (L) >60 mL/min    Comment: (NOTE) The eGFR has been calculated using the CKD EPI equation. This calculation has not been validated in all clinical situations. eGFR's persistently <60 mL/min signify possible Chronic Kidney Disease.    Anion gap 10 5 - 15  Glucose, capillary     Status: Abnormal   Collection Time: 03/27/17  3:38 AM  Result Value Ref Range   Glucose-Capillary 111 (H) 65 - 99 mg/dL   Comment 1 Notify RN   Glucose, capillary     Status: None   Collection Time: 03/27/17  8:08 AM  Result Value Ref Range   Glucose-Capillary 87 65 - 99 mg/dL  Glucose, capillary     Status: None   Collection Time: 03/27/17 11:44 AM  Result Value Ref Range   Glucose-Capillary 70 65 - 99 mg/dL  Glucose, capillary     Status: None   Collection Time: 03/27/17  2:37 PM  Result Value Ref Range   Glucose-Capillary 75 65 - 99 mg/dL  Glucose, capillary     Status: None   Collection Time: 03/27/17  8:12 PM   Result Value Ref Range   Glucose-Capillary 83 65 - 99 mg/dL   Comment 1 Notify RN   CBC     Status: Abnormal   Collection Time: 03/27/17  9:03 PM  Result Value Ref Range   WBC 6.7 4.0 - 10.5 K/uL   RBC 3.03 (L) 3.87 - 5.11 MIL/uL   Hemoglobin 8.9 (L) 12.0 - 15.0 g/dL   HCT 27.3 (L) 36.0 - 46.0 %   MCV 90.1 78.0 - 100.0 fL   MCH 29.4 26.0 - 34.0 pg   MCHC 32.6 30.0 - 36.0 g/dL   RDW 15.4 11.5 - 15.5 %   Platelets 122 (L) 150 - 400 K/uL  Glucose, capillary     Status: Abnormal   Collection Time: 03/28/17 12:15 AM  Result Value Ref Range   Glucose-Capillary 118 (H) 65 - 99 mg/dL  CBC     Status: Abnormal   Collection Time: 03/28/17  3:05 AM  Result Value Ref Range   WBC 5.4 4.0 - 10.5 K/uL   RBC 2.71 (L) 3.87 - 5.11 MIL/uL   Hemoglobin 7.6 (L) 12.0 - 15.0 g/dL   HCT 24.2 (L) 36.0 - 46.0 %   MCV 89.3 78.0 - 100.0 fL   MCH 28.0 26.0 - 34.0 pg   MCHC 31.4 30.0 - 36.0 g/dL   RDW 15.3 11.5 - 15.5 %   Platelets 116 (L) 150 - 400 K/uL    Comment: REPEATED TO VERIFY PLATELET COUNT CONFIRMED BY SMEAR   Basic metabolic panel     Status: Abnormal   Collection Time: 03/28/17  3:05 AM  Result Value Ref Range   Sodium 140 135 - 145 mmol/L   Potassium 3.8 3.5 - 5.1 mmol/L   Chloride 108 101 - 111 mmol/L   CO2 25 22 - 32 mmol/L   Glucose, Bld 103 (H) 65 - 99 mg/dL   BUN 21 (H) 6 - 20 mg/dL   Creatinine, Ser 1.16 (H) 0.44 - 1.00 mg/dL   Calcium 8.7 (L) 8.9 - 10.3 mg/dL   GFR calc non Af Amer 44 (L) >60 mL/min   GFR calc Af Amer 51 (L) >60 mL/min    Comment: (  NOTE) The eGFR has been calculated using the CKD EPI equation. This calculation has not been validated in all clinical situations. eGFR's persistently <60 mL/min signify possible Chronic Kidney Disease.    Anion gap 7 5 - 15  Glucose, capillary     Status: None   Collection Time: 03/28/17  4:11 AM  Result Value Ref Range   Glucose-Capillary 96 65 - 99 mg/dL  Glucose, capillary     Status: None   Collection Time: 03/28/17   8:07 AM  Result Value Ref Range   Glucose-Capillary 84 65 - 99 mg/dL    Studies/Results: Nm Gi Blood Loss  Result Date: 03/27/2017 CLINICAL DATA:  Pain this rectal bleeding. Colonoscopy showed blood clots in the entire colon with some diverticulosis. EXAM: NUCLEAR MEDICINE GASTROINTESTINAL BLEEDING SCAN TECHNIQUE: Sequential abdominal images were obtained following intravenous administration of Tc-86mlabeled red blood cells. RADIOPHARMACEUTICALS:  25.0 mCi Tc-975mn-vitro labeled red cells. COMPARISON:  None. FINDINGS: Imaging was conducted over 2 hours. There is a focal area of progressive increased radiotracer activity in the right upper quadrant, which projects over the expected location of the right renal pelvis. This does not progressed distally as expected for the source of GI bleeding. The exact etiology of this focus of radiotracer accumulation is unclear over the past evaluated in conjunction with an abdomen and pelvis CT scan. There are no other areas of abnormal radiotracer accumulation. IMPRESSION: 1. There is a focal area of progressive increased radiotracer activity within the right mid to upper quadrant, that may correspond to the right kidney in the right renal pelvis. This is felt unlikely to reflect an area of GI bleeding, since it does not progressed distally. It is of unclear etiology, however. Recommend followup abdomen and pelvis CT with contrast to correlate with this exam. 2. No other areas of abnormal radiotracer accumulation. Electronically Signed   By: DaLajean Manes.D.   On: 03/27/2017 20:44    Medications: I have reviewed the patient's current medications.  Assessment: Rectal bleeding most likely secondary to diverticulosis noted on colonoscopy yesterday. Remains hemodynamically stable with no further episodes of rectal bleeding.   Plan: We will review results of CAT scan of the abdomen and pelvis scheduled for today. Although hemoglobin has dropped from 8.9-7.6, no  further episodes of rectal bleeding is reassuring. If the CAT scan of the abdomen is unremarkable, please advance patient's diet to high-fiber diet. If hemoglobin is stable, okay to discharge in a.m. to follow up with me as an outpatient. On the other hand, if there is evidence of further rectal bleeding with drop in hemoglobin patient may benefit from IR guided embolization for suspected diverticular bleed.   ArRonnette Juniper/17/2018, 11:16 AM   Pager [phone] no answer or after 5 PM call [phone]

## 2017-03-28 NOTE — Care Management Note (Addendum)
Case Management Note  Patient Details  Name: [name] MRN: [id number] Date of Birth: 09-09-1941  Subjective/Objective:   From home, presents with GIB , afib, hypervolemic, DM, htn, CAD , ckd.  Patient has a rollator , cane, bsc, grab bars, hosp bed.  She states she has had [facility] before with [facility].  She also has private duty help on Tues and Thurs at 10 am  For a hour and 15 mins to help her shower and make her bed and a little cleaning.    Hospital follow up apt set up with [facility] with [facility] 7/23 at 2 pm. NCM will see if can get earlier apt at 11 am .  Will call tomorrow.            Action/Plan: NCM will follow for dc needs.   Expected Discharge Date:                  Expected Discharge Plan:  Home/Self Care  In-House Referral:     Discharge planning Services  CM Consult  Post Acute Care Choice:    Choice offered to:     DME Arranged:    DME Agency:     HH Arranged:    HH Agency:     Status of Service:  In process, will continue to follow  If discussed at Long Length of Stay Meetings, dates discussed:    Additional Comments:  Zenon Mayo, RN 03/28/2017, 4:51 PM

## 2017-03-29 LAB — BPAM RBC
BLOOD PRODUCT EXPIRATION DATE: 201806072359
Blood Product Expiration Date: 201805242359
Blood Product Expiration Date: 201806072359
Blood Product Expiration Date: 201806082359
ISSUE DATE / TIME: 201805150031
ISSUE DATE / TIME: 201805142150
ISSUE DATE / TIME: 201805171821
ISSUE DATE / TIME: 201805172221
Unit Type and Rh: 5100
Unit Type and Rh: 5100
Unit Type and Rh: 5100
Unit Type and Rh: 5100

## 2017-03-29 LAB — CBC
HCT: 30.9 % — ABNORMAL LOW (ref 36.0–46.0)
HEMOGLOBIN: 10.2 g/dL — AB (ref 12.0–15.0)
MCH: 29.4 pg (ref 26.0–34.0)
MCHC: 33 g/dL (ref 30.0–36.0)
MCV: 89 fL (ref 78.0–100.0)
PLATELETS: 106 10*3/uL — AB (ref 150–400)
RBC: 3.47 MIL/uL — AB (ref 3.87–5.11)
RDW: 15.4 % (ref 11.5–15.5)
WBC: 5.3 10*3/uL (ref 4.0–10.5)

## 2017-03-29 LAB — TYPE AND SCREEN
ABO/RH(D): O POS
Antibody Screen: NEGATIVE
UNIT DIVISION: 0
UNIT DIVISION: 0
Unit division: 0
Unit division: 0

## 2017-03-29 LAB — GLUCOSE, CAPILLARY
GLUCOSE-CAPILLARY: 100 mg/dL — AB (ref 65–99)
GLUCOSE-CAPILLARY: 83 mg/dL (ref 65–99)
GLUCOSE-CAPILLARY: 99 mg/dL (ref 65–99)
GLUCOSE-CAPILLARY: 206 mg/dL — AB (ref 65–99)
Glucose-Capillary: 123 mg/dL — ABNORMAL HIGH (ref 65–99)
Glucose-Capillary: 147 mg/dL — ABNORMAL HIGH (ref 65–99)
Glucose-Capillary: 198 mg/dL — ABNORMAL HIGH (ref 65–99)

## 2017-03-29 LAB — HEMOGLOBIN AND HEMATOCRIT, BLOOD
HEMATOCRIT: 29 % — AB (ref 36.0–46.0)
Hemoglobin: 9.3 g/dL — ABNORMAL LOW (ref 12.0–15.0)

## 2017-03-29 NOTE — Progress Notes (Signed)
Family Medicine Teaching Service Daily Progress Note Intern Pager: [phone]  Patient name: [name] Medical record number: [id number] Date of birth: [birth year] Age: 76 y.o. Gender: female  Primary Care Provider: Patient, No Pcp Per Consultants: GI Code Status: DNI  Assessment and Plan: [name] a 76 y.o.femalepresenting with bloody bowel movements x3 and on Eliquis, last taken 5/14 am. PMH is significant for CAD with CABG 2009, ischemic cardiomyopathy, atrial fibrillation on Eliquis, systolic CHF (last echo 12/4399 EF 35-40% with diffuse hypokinesis and pulm HTN), HLD, DM, CKD, CVA.   #Anemia most likely 2/2 GI bleed, ongoing Patient received 2 units of pRBCs yesterday with an post transfusion H/H 9.3/29. Patient is asymptomatic and stable. Patient also had a CT abdomen pelvis which showed a 3.7 cm RLQ lesion that could represent a tumor/malignancy, complex duplication cyst, or chronic complex postoperative collection. There no definite area of bleeding. --Continue to hold Eliquis for 1 more day per GI recs --Coninue protonix --Update GI and follow up on recs --Further assessment of RLQ lesion, possible surgical consult --Follow up on am CBC  #A.fib, rate controlled Patient was on Eliquis at home, last anticoagulation 5/14 am. Holding in the setting of acute GI.  --Continue to hold Eliquis for 1 more day per GI recs  --Consult GI prior  To restarting eliquis --Continue Carvedilol 6.25 mg bid  #HFpEF, mildly hypervolemic Patient is seeing Dr. Julianne Handler. Home meds coreg, ASA, statin, lasix 40 mg daily. Patient reports this is baseline volume status. Lungs clear, breathing at baseline. BNP 149.9. Cr 1.07. --Continue Lasix 40 mg daily  --Follow on Electrolytes   #Diabetes  Hba1c 6.5 this admission. patient reports taking 23U Lantus QHS at home. CBG 116, 111, 87 overnight, received 8u long acting and 1u short acting insulin overnight --Continue SSI --Continue current  dec dose of insulin while clears/ NPO titrate up as needed  #HTN BP 124/53. Continue to monitor.  --Continue Carvedilol 6.25 mg bid  #CAD/HLD CABG 2009, ischemic cardiomyopathy. Patient follows with Dr. Angelena Form outpatient.  --No ARB as an outpatient due to h/o hypotension --Continue lipitor  #CKD3 Followed with [facility] Dr. Audie Clear. Baseline in our system appears to be between 1.18 and 1.7. Creatine  This am is --Avoid nephrotoxic agents  FEN/GI: PPI PPx: None  Disposition: Patient could possibly be discharge today pending GI recommendations.  Subjective:  Patient is comfortable this morning, sit up in the chair with no complaints. Patient asked a few questions about diet and discharge.  Objective: Temp:  [97.9 F (36.6 C)-98.6 F (37 C)] 98 F (36.7 C) (05/18 0409) Pulse Rate:  [61-80] 80 (05/18 0409) Resp:  [11-23] 22 (05/18 0409) BP: (87-134)/(44-96) 121/96 (05/18 0409) SpO2:  [95 %-100 %] 99 % (05/18 0409)  Physical Exam: General: NAD, pleasant, able to participate in exam Cardiac: RRR, normal heart sounds, no murmurs. 2+ radial and PT pulses bilaterally Respiratory: CTAB, normal effort, No wheezes, rales or rhonchi Abdomen: soft, nontender, nondistended, no hepatic or splenomegaly, +BS Extremities: no edema or cyanosis. WWP. Skin: warm and dry, no rashes noted Neuro: alert and oriented x4, no focal deficits Psych: Normal affect and mood  Laboratory:  Recent Labs Lab 03/27/17 0323 03/27/17 2103 03/28/17 0305 03/29/17 0339  WBC 7.1 6.7 5.4  --   HGB 8.8* 8.9* 7.6* 9.3*  HCT 28.2* 27.3* 24.2* 29.0*  PLT 121* 122* 116*  --     Recent Labs Lab 03/25/17 1620 03/26/17 0430 03/27/17 0323 03/28/17 0305  NA 142  141 139 140  K 3.9 3.7 3.6 3.8  CL 106 107 105 108  CO2 26 26 24 25   BUN 36* 31* 26* 21*  CREATININE 1.31* 1.07* 1.07* 1.16*  CALCIUM 8.7* 8.6* 8.5* 8.7*  PROT 6.4*  --   --   --   BILITOT 0.8  --   --   --   ALKPHOS 99  --    --   --   ALT 14  --   --   --   AST 27  --   --   --   GLUCOSE 250* 116* 108* 103*    Imaging/Diagnostic Tests: Ct Abdomen Pelvis W Contrast  Result Date: 03/28/2017 CLINICAL DATA:  The patient has GI bleeding and right renal activity on packed red blood cell scan, could conceivably represent bleeding, for further workup. EXAM: CT ABDOMEN AND PELVIS WITH CONTRAST TECHNIQUE: Multidetector CT imaging of the abdomen and pelvis was performed using the standard protocol following bolus administration of intravenous contrast. CONTRAST:  6mL ISOVUE-300 IOPAMIDOL (ISOVUE-300) INJECTION 61% COMPARISON:  Tagged red blood cell scan of 03/27/2017 ; abdominal ultrasound from 07/26/2005 FINDINGS: Lower chest: Coronary atherosclerosis. Mitral annular calcification. Moderate cardiomegaly. Trace bilateral pleural effusions. Hepatobiliary: Multiple gallstones measuring up to 2.9 cm in diameter. No biliary dilatation. Pancreas: Unremarkable Spleen: Unremarkable Adrenals/Urinary Tract: Adrenal glands normal. The right kidney has an exaggerated anterior- posterior long axis, as shown on image 49/7. Vascular structures approach the kidney from the upper portion of the renal hilum, correlating with the focus of activity in the right upper quadrant, which probably represents activity in the right renal vein rather than collecting system activity. There is a tiny hypodense lesion of the upper anterior margin of the right kidney on image 34/3, technically too small to characterize but statistically likely to be a cyst. Similar lesion in the left kidney upper pole, technically too small to characterize. There is slight fullness of the right collecting system and right ureter extending down to the iliac vessel cross over. I do not see a stone or overt hydronephrosis. On the delayed images there is poor excretion without high density contrast medium in the collecting systems. This could reflect renal dysfunction. There is atrophy of  both kidneys. Urinary bladder mildly distended but otherwise unremarkable. Stomach/Bowel: Several areas of questionable wall thickening along the nondistended haustra of the colon including the proximal transverse colon for example on image 29/6, difficult to exclude the possibility of followups given the nondistended nature the colon. By report the patient has had prior appendectomy. In the right lower quadrant there is a 2.8 by 3.7 by 2.3 cm (volume = 12 cm^3) complex lesion, oval in shape, near the cecum, with dense peripheral rim. There is a basic suggestion that the appendiceal stump extends towards this lesion. Well-defined continuity of this lesion with the cecum or small bowel is not identified. Vascular/Lymphatic: Aortoiliac atherosclerotic vascular disease. A fatty right inguinal node measures 1.3 cm in short axis on image 97/3. No overtly pathologic adenopathy identified. Reproductive: Unremarkable Other: Mild edema/stranding in the pannus and lateral to the hips in the subcutaneous tissues. Musculoskeletal: Degenerative arthropathy of both hips. Lumbar spondylosis and degenerative disc disease likely causing impingement at all lumbar levels 2 varying degrees. IMPRESSION: 1. 3.7 cm right lower quadrant complex lesion with lower density centrally but peripheral high density in the right lower quadrant, potentially in the lower margin of the right perirenal space but also near the appendiceal stump. The patient has had prior  appendectomy. This could represent a tumor/malignancy, complex duplication cyst, or chronic complex postoperative collection. I am doubtful this is a Meckel's diverticulum is it appears to be separate from the bowel. This should be readily approachable percutaneously, if clinically warranted. 2. A definite site of GI bleeding is not seen, although there are some areas of haustral thickening along the colon which are probably due to nondistention but which could be due to small polyps.  3. Cholelithiasis. 4. No tumor of the right kidney is identified. The appearance of accentuated activity on recent red blood cell scan is believed to be due to the combination of the unusual anterior- posterior horizontal orientation of the long axis of the kidney, and the mild prominence of the renal vein along the upper portion of the renal hilum causing blood pool activity 2 appear focally prominent. 5. There is some delay in contrast excretion of both kidneys which could be from renal dysfunction. 6. Aortic Atherosclerosis (ICD10-I70.0). Coronary atherosclerosis with moderate cardiomegaly. 7. Trace bilateral pleural effusions. 8. Edema in the pannus and overlying the hips in the subcutaneous tissues, possibly from third spacing of fluid. 9. Mild fullness of the right renal collecting system and right proximal ureter extending down to the iliac vessel cross over, cause uncertain. 10. Lumbar spondylosis and degenerative disc disease causing multilevel impingement. There is also degenerative arthropathy of both hips. Electronically Signed   By: Van Clines M.D.   On: 03/28/2017 16:38    Marjie Skiff, MD 03/29/2017, 7:11 AM PGY-1, [place] Intern pager: [phone], text pages welcome

## 2017-03-29 NOTE — Progress Notes (Signed)
Patient tolerated sitting up to the bedside chair most of the entire day.  Patient is able to move herself around to get to the bedside commode.  Patient states they are going to do a biospsy Monday and she will know better then.

## 2017-03-29 NOTE — Progress Notes (Signed)
I reviewed the patient's CAT scan of the abdomen and pelvis, which states there is a complex lesion 3.7 cm,oval in shape near the cecum with peripheral dense rim suggestion of appendiceal stump extending towards this lesion.  Well-defined continuity of  this lesion with cecal or small bowel not identified. Differential diagnosis could include tumor, malignancy, complex duplication cyst, chronic complex postoperative collection or Meckel's diverticulum.  It is approachable percutaneously if clinically warranted. Definitive site of GI bleeding not noted, possibly small colonic polyps.  Patient's hemoglobin yesterday was 7.6 after transfusion it is 9.3. I did not examine the patient today but spoke with the family medicine resident and was told she has not had further rectal bleeding.  From GI standpoint, as there is no further rectal bleeding would recommend resuming anticoagulation in 3 days.  If clinically warranted patient may benefit from IR guided percutaneous approach to the lesion described on CAT scan.   Ronnette Juniper, M.D. [phone]

## 2017-03-29 NOTE — Care Management Important Message (Signed)
Important Message  Patient Details  Name: [name] MRN: [id number] Date of Birth: 02-Dec-1940   Medicare Important Message Given:  Yes    Nathen May 03/29/2017, 3:32 PM

## 2017-03-30 LAB — GLUCOSE, CAPILLARY
GLUCOSE-CAPILLARY: 104 mg/dL — AB (ref 65–99)
GLUCOSE-CAPILLARY: 90 mg/dL (ref 65–99)
GLUCOSE-CAPILLARY: 101 mg/dL — AB (ref 65–99)
GLUCOSE-CAPILLARY: 217 mg/dL — AB (ref 65–99)
Glucose-Capillary: 112 mg/dL — ABNORMAL HIGH (ref 65–99)
Glucose-Capillary: 144 mg/dL — ABNORMAL HIGH (ref 65–99)
Glucose-Capillary: 154 mg/dL — ABNORMAL HIGH (ref 65–99)

## 2017-03-30 LAB — CBC
HEMATOCRIT: 29 % — AB (ref 36.0–46.0)
HEMOGLOBIN: 9.2 g/dL — AB (ref 12.0–15.0)
MCH: 28.2 pg (ref 26.0–34.0)
MCHC: 31.7 g/dL (ref 30.0–36.0)
MCV: 89 fL (ref 78.0–100.0)
Platelets: 102 10*3/uL — ABNORMAL LOW (ref 150–400)
RBC: 3.26 MIL/uL — AB (ref 3.87–5.11)
RDW: 15.1 % (ref 11.5–15.5)
WBC: 4.5 10*3/uL (ref 4.0–10.5)

## 2017-03-30 NOTE — Progress Notes (Signed)
Family Medicine Teaching Service Daily Progress Note Intern Pager: [phone]  Patient name: [name] Medical record number: [id number] Date of birth: October 21, 1941 Age: 76 y.o. Gender: female  Primary Care Provider: Patient, No Pcp Per Consultants: GI Code Status: DNI  Assessment and Plan: LISL [place] a 76 y.o.femalepresenting with bloody bowel movements x3 and on Eliquis, last taken 5/14 am. PMH is significant for CAD with CABG 2009, ischemic cardiomyopathy, atrial fibrillation on Eliquis, systolic CHF (last echo 0/6269 EF 35-40% with diffuse hypokinesis and pulm HTN), HLD, DM, CKD, CVA.   #Anemia most likely 2/2 GI bleed, ongoing Patient received 2 units of pRBCs yesterday with an post transfusion H/H 9.3/29. Patient is asymptomatic and stable.This morning hemoglobin is stable at 9.2 despite reporting melena which could be from prior GI bleed.  Patient also had a CT abdomen pelvis which showed a 3.7 cm RLQ lesion that could represent a tumor/malignancy, complex duplication cyst, or chronic complex postoperative collection. There no definite area of bleeding. --Continue to hold Eliquis for 1 more day per GI recs --Coninue protonix --Follow up on am CBC  #RLQ Lesion, new finding Patient is scheduled for VIR procedure biopsy/drainage on Monday. -- will continue to monitor  #A.fib, rate controlled Patient was on Eliquis at home, last anticoagulation 5/14 am. Holding in the setting of acute GI.  --Continue to hold Eliquis with procedure on monday --Consult GI prior  To restarting eliquis --Continue Carvedilol 6.25 mg bid  #HFpEF, mildly hypervolemic Patient is seeing Dr. Julianne Handler. Home meds coreg, ASA, statin, lasix 40 mg daily. Patient reports this is baseline volume status. Lungs clear, breathing at baseline. BNP 149.9. Cr 1.07. --Continue Lasix 40 mg daily  --Follow on Electrolytes   #Diabetes  Hba1c 6.5 this admission. patient reports taking 23U Lantus QHS at home.  CBG 116, 111, 87 overnight, received 8u long acting and 1u short acting insulin overnight --Continue SSI --Continue current dec dose of insulin while clears/ NPO titrate up as needed  #HTN BP 124/53. Continue to monitor.  --Continue Carvedilol 6.25 mg bid  #CAD/HLD CABG 2009, ischemic cardiomyopathy. Patient follows with Dr. Angelena Form outpatient.  --No ARB as an outpatient due to h/o hypotension --Continue lipitor  #CKD3 Followed with [facility] Dr. Audie Clear. Baseline in our system appears to be between 1.18 and 1.7. Creatine  This am is --Avoid nephrotoxic agents  FEN/GI: PPI PPx: None  Disposition: Patient could possibly be discharge today pending GI recommendations.  Subjective:  Patient is comfortable this morning, no complaints and in agreement with plan.   Objective: Temp:  [98 F (36.7 C)-98.9 F (37.2 C)] 98.9 F (37.2 C) (05/19 1040) Pulse Rate:  [61-72] 72 (05/19 1040) Resp:  [12-18] 18 (05/19 1040) BP: (99-154)/(40-63) 101/52 (05/19 1040) SpO2:  [97 %-100 %] 97 % (05/19 1040)  Physical Exam: General: NAD, pleasant, able to participate in exam Cardiac: RRR, normal heart sounds, no murmurs. 2+ radial and PT pulses bilaterally Respiratory: CTAB, normal effort, No wheezes, rales or rhonchi Abdomen: soft, nontender, nondistended, no hepatic or splenomegaly, +BS Extremities: no edema or cyanosis. WWP. Skin: warm and dry, no rashes noted Neuro: alert and oriented x4, no focal deficits Psych: Normal affect and mood  Laboratory:  Recent Labs Lab 03/28/17 0305 03/29/17 0339 03/29/17 0922 03/30/17 0500  WBC 5.4  --  5.3 4.5  HGB 7.6* 9.3* 10.2* 9.2*  HCT 24.2* 29.0* 30.9* 29.0*  PLT 116*  --  106* 102*    Recent Labs Lab 03/25/17 1620 03/26/17  0430 03/27/17 0323 03/28/17 0305  NA 142 141 139 140  K 3.9 3.7 3.6 3.8  CL 106 107 105 108  CO2 26 26 24 25   BUN 36* 31* 26* 21*  CREATININE 1.31* 1.07* 1.07* 1.16*  CALCIUM 8.7* 8.6* 8.5* 8.7*   PROT 6.4*  --   --   --   BILITOT 0.8  --   --   --   ALKPHOS 99  --   --   --   ALT 14  --   --   --   AST 27  --   --   --   GLUCOSE 250* 116* 108* 103*    Imaging/Diagnostic Tests: No results found.  Marjie Skiff, MD 03/30/2017, 12:17 PM PGY-1, [place] Intern pager: [phone], text pages welcome

## 2017-03-30 NOTE — Progress Notes (Signed)
Pt arrived to the floor via stretcher. Alert & Oriented x4. Saline locked IV in her Left AC.

## 2017-03-31 LAB — PROTIME-INR
INR: 1.36
Prothrombin Time: 16.9 seconds — ABNORMAL HIGH (ref 11.4–15.2)

## 2017-03-31 LAB — BASIC METABOLIC PANEL
ANION GAP: 8 (ref 5–15)
BUN: 12 mg/dL (ref 6–20)
CALCIUM: 8.8 mg/dL — AB (ref 8.9–10.3)
CO2: 26 mmol/L (ref 22–32)
Chloride: 104 mmol/L (ref 101–111)
Creatinine, Ser: 1.33 mg/dL — ABNORMAL HIGH (ref 0.44–1.00)
GFR calc Af Amer: 43 mL/min — ABNORMAL LOW (ref >60)
GFR, EST NON AFRICAN AMERICAN: 37 mL/min — AB (ref >60)
GLUCOSE: 168 mg/dL — AB (ref 65–99)
POTASSIUM: 3.2 mmol/L — AB (ref 3.5–5.1)
SODIUM: 138 mmol/L (ref 135–145)

## 2017-03-31 LAB — CBC
HCT: 32.8 % — ABNORMAL LOW (ref 36.0–46.0)
Hemoglobin: 10.1 g/dL — ABNORMAL LOW (ref 12.0–15.0)
MCH: 27.7 pg (ref 26.0–34.0)
MCHC: 30.8 g/dL (ref 30.0–36.0)
MCV: 90.1 fL (ref 78.0–100.0)
Platelets: 139 10*3/uL — ABNORMAL LOW (ref 150–400)
RBC: 3.64 MIL/uL — AB (ref 3.87–5.11)
RDW: 15.1 % (ref 11.5–15.5)
WBC: 5.1 10*3/uL (ref 4.0–10.5)

## 2017-03-31 LAB — GLUCOSE, CAPILLARY
GLUCOSE-CAPILLARY: 115 mg/dL — AB (ref 65–99)
GLUCOSE-CAPILLARY: 132 mg/dL — AB (ref 65–99)
GLUCOSE-CAPILLARY: 219 mg/dL — AB (ref 65–99)
Glucose-Capillary: 115 mg/dL — ABNORMAL HIGH (ref 65–99)
Glucose-Capillary: 176 mg/dL — ABNORMAL HIGH (ref 65–99)

## 2017-03-31 MED ORDER — HEPARIN (PORCINE) IN NACL 100-0.45 UNIT/ML-% IJ SOLN
1200.0000 [IU]/h | INTRAMUSCULAR | Status: DC
  Administered 2017-03-31 – 2017-04-02 (×3): 1200 [IU]/h via INTRAVENOUS
  Filled 2017-03-31 (×2): qty 250

## 2017-03-31 MED ORDER — POTASSIUM CHLORIDE CRYS ER 20 MEQ PO TBCR
40.0000 meq | EXTENDED_RELEASE_TABLET | Freq: Once | ORAL | Status: AC
  Administered 2017-03-31: 40 meq via ORAL
  Filled 2017-03-31: qty 2

## 2017-03-31 NOTE — Progress Notes (Signed)
Family Medicine Teaching Service Daily Progress Note Intern Pager: [phone]  Patient name: [name] Medical record number: [id number] Date of birth: 10-Feb-1941 Age: 76 y.o. Gender: female  Primary Care Provider: Patient, No Pcp Per Consultants: GI Code Status: DNI  Assessment and Plan: [name] a 76 y.o.femalepresenting with bloody bowel movements x3 and on Eliquis, last taken 5/14 am. PMH is significant for CAD with CABG 2009, ischemic cardiomyopathy, atrial fibrillation on Eliquis, systolic CHF (last echo 02/9448 EF 35-40% with diffuse hypokinesis and pulm HTN), HLD, DM, CKD, CVA.   #Anemia most likely 2/2 GI bleed, ongoing Hbg is 10.1 s/p 2u pRBCs on 5/18. Patient is asymptomatic and stable.This morning hemoglobin is stable  --Consider starting patient on heparin instead of Eliquis since she is schedule for a VIR procedure on Monday 5/21 --Continue protonix --Follow up on am CBC --Follow up on PT, INR  #RLQ Lesion, new finding Patient is scheduled for VIR procedure biopsy/drainage on Monday. -- Will continue to monitor  #A.fib, rate controlled Patient was on Eliquis at home, last anticoagulation 5/14 am. Holding in the setting of acute GI.  --Continue to hold Eliquis with procedure on Monday, possibly start patient on heparin would have to assess clinical value.  --Continue Carvedilol 6.25 mg bid  #HFpEF, mildly hypervolemic Patient is seeing Dr. Julianne Handler. Home meds coreg, ASA, statin, lasix 40 mg daily. Patient reports this is baseline volume status. Lungs clear, breathing at baseline. BNP 149.9. Cr 1.07. --Continue Lasix 40 mg daily  --Follow up on BNP  #Diabetes  Hba1c 6.5 this admission. --Continue SSI --Will titrate up insulin as needed since patient has a diet  #HTN BP 115/41. Continue to monitor.  --Continue Carvedilol 6.25 mg bid  #CAD/HLD CABG 2009, ischemic cardiomyopathy. Patient follows with Dr. Angelena Form outpatient.  --No ARB as an  outpatient due to h/o hypotension --Continue lipitor 40 mg daily  #CKD3 Followed with [facility] Dr. Audie Clear. Baseline in our system appears to be between 1.18 and 1.7. Creatine this am is --Avoid nephrotoxic agents  FEN/GI: PPI, Heart healthy/Carb modified PPx: None  Disposition: anticipate discharge tomorrow after VIR procedure.  Subjective:  Patient is feeling better, endorse some BMs yesterday which have not been bloody.  Objective: Temp:  [98.1 F (36.7 C)-98.9 F (37.2 C)] 98.9 F (37.2 C) (05/20 0400) Pulse Rate:  [54-77] 54 (05/20 0400) Resp:  [18] 18 (05/20 0400) BP: (101-139)/(37-52) 115/41 (05/20 0400) SpO2:  [95 %-100 %] 95 % (05/20 0400)  Physical Exam: General: NAD, pleasant, able to participate in exam Cardiac: RRR, normal heart sounds, no murmurs. 2+ radial and PT pulses bilaterally Respiratory: CTAB, normal effort, No wheezes, rales or rhonchi Abdomen: soft, nontender, nondistended, no hepatic or splenomegaly, +BS Extremities: no edema or cyanosis. WWP. Skin: warm and dry, no rashes noted Neuro: alert and oriented x4, no focal deficits Psych: Normal affect and mood  Laboratory:  Recent Labs Lab 03/28/17 0305 03/29/17 0339 03/29/17 0922 03/30/17 0500  WBC 5.4  --  5.3 4.5  HGB 7.6* 9.3* 10.2* 9.2*  HCT 24.2* 29.0* 30.9* 29.0*  PLT 116*  --  106* 102*    Recent Labs Lab 03/25/17 1620 03/26/17 0430 03/27/17 0323 03/28/17 0305  NA 142 141 139 140  K 3.9 3.7 3.6 3.8  CL 106 107 105 108  CO2 26 26 24 25   BUN 36* 31* 26* 21*  CREATININE 1.31* 1.07* 1.07* 1.16*  CALCIUM 8.7* 8.6* 8.5* 8.7*  PROT 6.4*  --   --   --  BILITOT 0.8  --   --   --   ALKPHOS 99  --   --   --   ALT 14  --   --   --   AST 27  --   --   --   GLUCOSE 250* 116* 108* 103*    Imaging/Diagnostic Tests: No results found.  Marjie Skiff, MD 03/31/2017, 9:16 AM PGY-1, [place] Intern pager: [phone], text pages welcome

## 2017-03-31 NOTE — Progress Notes (Signed)
ANTICOAGULATION CONSULT NOTE - Initial Consult  Pharmacy Consult for heparin  Indication: atrial fibrillation  Allergies  Allergen Reactions  . Benazepril Other (See Comments)    Unknown reaction at age 76-65 - possibly dizziness  . Angiotensin Receptor Blockers Other (See Comments)    Hypotension reaction  . Fe-Succ-C-Thre-B12-Des Stomach Other (See Comments)    Unknown reaction  . Spironolactone Other (See Comments)    Dizziness   . Sulfamethoxazole-Trimethoprim Other (See Comments)    Unknown reaction    Patient Measurements: Height: 5\' 10"  (177.8 cm) Weight: 298 lb (135.2 kg) IBW/kg (Calculated) : 68.5 Heparin Dosing Weight: 100kg  Vital Signs: Temp: 98.6 F (37 C) (05/20 2011) Temp Source: Oral (05/20 2011) BP: 101/76 (05/20 2011) Pulse Rate: 58 (05/20 2011)  Labs:  Recent Labs  03/29/17 0922 03/30/17 0500 03/31/17 0933  HGB 10.2* 9.2* 10.1*  HCT 30.9* 29.0* 32.8*  PLT 106* 102* 139*  LABPROT  --   --  16.9*  INR  --   --  1.36  CREATININE  --   --  1.33*    Estimated Creatinine Clearance: 52.4 mL/min (A) (by C-G formula based on SCr of 1.33 mg/dL (H)).   Medical History: Past Medical History:  Diagnosis Date  . ACC/AHA stage C congestive heart failure due to ischemic cardiomyopathy ([place]) 01/31/2009   Qualifier: Diagnosis of  By: Olevia Perches, MD, Glenetta Hew   . Acute CVA (cerebrovascular accident) ([place]) 03/31/2016  . Acute hemorrhagic infarction of brain ([place])   . Acute kidney injury ([place]) 03/26/2017  . ANXIETY 01/31/2009   Qualifier: Diagnosis of  By: Lovette Cliche, CNA, Christy    . Arthritis    "qwhere" (03/30/2016)  . Atrial fibrillation ([place])    Postoperative in 2009;  S/P transesophageal echocardiography-guided cardioversion, previously on Amiodarone and Coumadin; recurrent in April 2013  . Benign essential HTN   . CAD (coronary artery disease) of artery bypass graft 05/2008   S/P CABG in 2009  . Cerebrovascular accident (CVA) due to embolism of  right middle cerebral artery ([place])   . CHF (congestive heart failure) ([place])   . Chronic anticoagulation   . Chronic kidney disease (CKD), stage III (moderate)   . Chronic lower back pain   . Chronic pain syndrome   . CKD (chronic kidney disease) stage 3, GFR 30-59 ml/min 03/30/2016  . Coronary artery disease involving coronary bypass graft of native heart without angina pectoris   . DM type 2 with diabetic peripheral neuropathy ([place])   . Facial weakness, post-stroke   . Gastrointestinal hemorrhage 03/26/2017  . Heart murmur   . Hemiparesis ([place])   . History of CVA with residual deficit 03/26/2017  . Hyperlipidemia   . Hypertension   . Ischemic cardiomyopathy   . Morbid obesity ([place]) 03/26/2017  . Obesities, morbid ([place])   . Persistent atrial fibrillation ([place])   . Right middle cerebral artery stroke ([place]) 04/04/2016  . Stroke ([place])   . Systolic heart failure   . TACHYCARDIA, HX OF 01/31/2009   Qualifier: Diagnosis of  By: Lovette Cliche, CNA, Christy    . Tachypnea   . Thrombocytopenia ([place])   . TIA (transient ischemic attack) 03/30/2016  . Type II diabetes mellitus ([place])   . UTI (urinary tract infection) 04/01/2016    Medications:  Prescriptions Prior to Admission  Medication Sig Dispense Refill Last Dose  . apixaban (ELIQUIS) 5 MG TABS tablet Take 1 tablet (5 mg total) by mouth 2 (two) times daily. 60 tablet 11 03/25/2017 at  600  . aspirin EC 81 MG tablet Take 1 tablet (81 mg total) by mouth daily. 90 tablet 3 03/25/2017 at am  . atorvastatin (LIPITOR) 40 MG tablet Take 1 tablet (40 mg total) by mouth daily at 6 PM. (Patient taking differently: Take 40 mg by mouth at bedtime. ) 30 tablet 0 03/24/2017 at pm  . carvedilol (COREG) 6.25 MG tablet Take 1 tablet (6.25 mg total) by mouth 2 (two) times daily with a meal. 60 tablet 6 03/25/2017 at 600  . cholecalciferol (VITAMIN D) 1000 UNITS tablet Take 1,000 Units by mouth 2 (two) times daily.   03/25/2017 at am  . docusate sodium (COLACE) 100 MG capsule  Take 200 mg by mouth at bedtime.    03/24/2017 at Unknown time  . furosemide (LASIX) 40 MG tablet Take 1 tablet (40 mg total) by mouth daily. 30 tablet 0 03/25/2017 at am  . gabapentin (NEURONTIN) 400 MG capsule Take 1 capsule (400 mg total) by mouth at bedtime. (Patient taking differently: Take 400 mg by mouth 2 (two) times daily. ) 30 capsule 1 03/25/2017 at am  . insulin glargine (LANTUS) 100 UNIT/ML injection Inject 0.18 mLs (18 Units total) into the skin at bedtime. (Patient taking differently: Inject 23 Units into the skin at bedtime. ) 10 mL 11 03/24/2017 at pm  . neomycin-bacitracin-polymyxin (NEOSPORIN) OINT Apply 1 application topically 3 (three) times daily as needed for wound care.   03/25/2017 at am  . nitroGLYCERIN (NITROSTAT) 0.4 MG SL tablet Place 0.4 mg under the tongue every 5 (five) minutes as needed for chest pain (up to 3 doses).    never taken  . potassium chloride SA (K-DUR,KLOR-CON) 20 MEQ tablet Take 1 tablet (20 mEq total) by mouth daily. 30 tablet 1 03/25/2017 at am  . rOPINIRole (REQUIP) 0.5 MG tablet Take 1 mg by mouth at bedtime.   03/24/2017 at pm  . silver sulfADIAZINE (SILVADENE) 1 % cream Apply 1 application topically 3 (three) times daily as needed (wound care).   03/25/2017 at am  . traMADol (ULTRAM) 50 MG tablet Take 50-100 mg by mouth See admin instructions. Take 2 tablets (100 mg) by mouth every morning and 1 tablet (50 mg) at night   03/25/2017 at am  . vitamin B-12 (CYANOCOBALAMIN) 1000 MCG tablet Take 1,000 mcg by mouth daily.   03/25/2017 at am   Scheduled:  . atorvastatin  40 mg Oral q1800  . carvedilol  6.25 mg Oral BID WC  . furosemide  40 mg Oral Daily  . gabapentin  400 mg Oral BID  . insulin aspart  0-9 Units Subcutaneous Q4H  . insulin glargine  8 Units Subcutaneous QHS  . mouth rinse  15 mL Mouth Rinse BID  . potassium chloride SA  20 mEq Oral Daily  . potassium chloride  40 mEq Oral Once  . rOPINIRole  1 mg Oral QHS  . sodium chloride flush  3 mL  Intravenous Q12H  . vitamin B-12  1,000 mcg Oral Daily    Assessment: 76 yo female with anemia and concern for GIB (presented with bloody bowel movements x3) . She is on apixiban PTA (last dose taken 5/14). The CT abdomen shows a lesion and plans are for  biopsy/drainage on Monday. GI is also following and no further bleeding has been noted . Pharmacy consulted to dose heparin (plans noted for likely Xarelto post procedure) -Hg= 10.1 (up from 7.6 on 5/17)   Goal of Therapy:  Heparin level= 0.3-0.5 (reduced  goal due to concern of GIB) Monitor platelets by anticoagulation protocol: Yes   Plan:  -No heparin bolus  -Begin heparin infusion at 1200 units/hr  -Heparin level in 8 hours and daily wth CBC daily  Hildred Laser, Pharm D 03/31/2017 9:39 PM

## 2017-04-01 ENCOUNTER — Inpatient Hospital Stay (HOSPITAL_COMMUNITY): Payer: Medicare Other

## 2017-04-01 DIAGNOSIS — I480 Paroxysmal atrial fibrillation: Secondary | ICD-10-CM

## 2017-04-01 DIAGNOSIS — K922 Gastrointestinal hemorrhage, unspecified: Secondary | ICD-10-CM

## 2017-04-01 DIAGNOSIS — R1903 Right lower quadrant abdominal swelling, mass and lump: Secondary | ICD-10-CM

## 2017-04-01 LAB — BASIC METABOLIC PANEL
Anion gap: 6 (ref 5–15)
BUN: 14 mg/dL (ref 6–20)
CHLORIDE: 103 mmol/L (ref 101–111)
CO2: 28 mmol/L (ref 22–32)
CREATININE: 1.29 mg/dL — AB (ref 0.44–1.00)
Calcium: 8.5 mg/dL — ABNORMAL LOW (ref 8.9–10.3)
GFR calc non Af Amer: 39 mL/min — ABNORMAL LOW (ref >60)
GFR, EST AFRICAN AMERICAN: 45 mL/min — AB (ref >60)
Glucose, Bld: 155 mg/dL — ABNORMAL HIGH (ref 65–99)
POTASSIUM: 3.3 mmol/L — AB (ref 3.5–5.1)
SODIUM: 137 mmol/L (ref 135–145)

## 2017-04-01 LAB — CBC
HCT: 29.7 % — ABNORMAL LOW (ref 36.0–46.0)
HEMOGLOBIN: 9.3 g/dL — AB (ref 12.0–15.0)
MCH: 28.2 pg (ref 26.0–34.0)
MCHC: 31.3 g/dL (ref 30.0–36.0)
MCV: 90 fL (ref 78.0–100.0)
Platelets: 121 10*3/uL — ABNORMAL LOW (ref 150–400)
RBC: 3.3 MIL/uL — AB (ref 3.87–5.11)
RDW: 15 % (ref 11.5–15.5)
WBC: 5.4 10*3/uL (ref 4.0–10.5)

## 2017-04-01 LAB — GLUCOSE, CAPILLARY
GLUCOSE-CAPILLARY: 145 mg/dL — AB (ref 65–99)
GLUCOSE-CAPILLARY: 148 mg/dL — AB (ref 65–99)
GLUCOSE-CAPILLARY: 215 mg/dL — AB (ref 65–99)
Glucose-Capillary: 154 mg/dL — ABNORMAL HIGH (ref 65–99)
Glucose-Capillary: 147 mg/dL — ABNORMAL HIGH (ref 65–99)
Glucose-Capillary: 205 mg/dL — ABNORMAL HIGH (ref 65–99)

## 2017-04-01 LAB — HEPARIN LEVEL (UNFRACTIONATED): HEPARIN UNFRACTIONATED: 0.46 [IU]/mL (ref 0.30–0.70)

## 2017-04-01 MED ORDER — MIDAZOLAM HCL 2 MG/2ML IJ SOLN
INTRAMUSCULAR | Status: AC
  Filled 2017-04-01: qty 4

## 2017-04-01 MED ORDER — MIDAZOLAM HCL 2 MG/2ML IJ SOLN
INTRAMUSCULAR | Status: AC | PRN
  Administered 2017-04-01: 0.5 mg via INTRAVENOUS
  Administered 2017-04-01: 1 mg via INTRAVENOUS
  Administered 2017-04-01: 0.5 mg via INTRAVENOUS

## 2017-04-01 MED ORDER — FLUMAZENIL 0.5 MG/5ML IV SOLN
INTRAVENOUS | Status: DC
Start: 2017-04-01 — End: 2017-04-01
  Filled 2017-04-01: qty 5

## 2017-04-01 MED ORDER — NALOXONE HCL 0.4 MG/ML IJ SOLN
INTRAMUSCULAR | Status: AC
  Filled 2017-04-01: qty 1

## 2017-04-01 MED ORDER — FENTANYL CITRATE (PF) 100 MCG/2ML IJ SOLN
INTRAMUSCULAR | Status: AC
  Filled 2017-04-01: qty 4

## 2017-04-01 MED ORDER — LIDOCAINE HCL 1 % IJ SOLN
INTRAMUSCULAR | Status: AC
  Filled 2017-04-01: qty 20

## 2017-04-01 MED ORDER — FENTANYL CITRATE (PF) 100 MCG/2ML IJ SOLN
INTRAMUSCULAR | Status: AC | PRN
  Administered 2017-04-01: 25 ug via INTRAVENOUS
  Administered 2017-04-01: 50 ug via INTRAVENOUS
  Administered 2017-04-01: 25 ug via INTRAVENOUS

## 2017-04-01 MED ORDER — POTASSIUM CHLORIDE CRYS ER 20 MEQ PO TBCR
40.0000 meq | EXTENDED_RELEASE_TABLET | Freq: Once | ORAL | Status: AC
  Administered 2017-04-01: 40 meq via ORAL
  Filled 2017-04-01: qty 2

## 2017-04-01 NOTE — Progress Notes (Signed)
ANTICOAGULATION CONSULT NOTE - Follow Up Consult  Pharmacy Consult for heparin Indication: atrial fibrillation  Labs:  Recent Labs  03/29/17 0922 03/30/17 0500 03/31/17 0933 04/01/17 0542  HGB 10.2* 9.2* 10.1*  --   HCT 30.9* 29.0* 32.8*  --   PLT 106* 102* 139*  --   LABPROT  --   --  16.9*  --   INR  --   --  1.36  --   HEPARINUNFRC  --   --   --  0.46  CREATININE  --   --  1.33* 1.29*    Assessment/Plan:  76yo female therapeutic on heparin with initial dosing for . Will continue gtt at current rate and confirm stable with additional level.   Wynona Neat, PharmD, BCPS  04/01/2017,6:27 AM

## 2017-04-01 NOTE — Consult Note (Signed)
Chief Complaint: Patient was seen in consultation today for RLQ abdominal mass  Referring Physician(s): Dr. Marjie Skiff  Supervising Physician: Markus Daft  Patient Status: [facility] - In-pt  History of Present Illness: [name] is a 76 y.o. female with past medical history of CHF, CVA, a fib, CAD, CKD, and DM presented to [facility] with bloody bowel movements x3.  Patient was on Elliquis at home, but was last taken 5/14.     CT Abd/Pelvis 5/17 showed: 1. 3.7 cm right lower quadrant complex lesion with lower density centrally but peripheral high density in the right lower quadrant, potentially in the lower margin of the right perirenal space but also near the appendiceal stump. The patient has had prior appendectomy. This could represent a tumor/malignancy, complex duplication cyst, or chronic complex postoperative collection. I am doubtful this is a Meckel's diverticulum is it appears to be separate from the bowel. This should be readily approachable percutaneously, if clinically warranted. 2. A definite site of GI bleeding is not seen, although there are some areas of haustral thickening along the colon which are probably due to nondistention but which could be due to small polyps. 3. Cholelithiasis. 4. No tumor of the right kidney is identified. The appearance of accentuated activity on recent red blood cell scan is believed to be due to the combination of the unusual anterior- posterior horizontal orientation of the long axis of the kidney, and the mild prominence of the renal vein along the upper portion of the renal hilum causing blood pool activity 2 appear focally prominent. 5. There is some delay in contrast excretion of both kidneys which could be from renal dysfunction. 6. Aortic Atherosclerosis (ICD10-I70.0). Coronary atherosclerosis with moderate cardiomegaly. 7. Trace bilateral pleural effusions. 8. Edema in the pannus and overlying the hips in the subcutaneous tissues,  possibly from third spacing of fluid. 9. Mild fullness of the right renal collecting system and right proximal ureter extending down to the iliac vessel cross over, cause uncertain. 10. Lumbar spondylosis and degenerative disc disease causing multilevel impingement. There is also degenerative arthropathy of both hips.  IR consulted for possible biopsy of the RLQ mass/collection.  Case reviewed by Dr. Anselm Pancoast who feels patient is appropriate.    Patient has been NPO.  Heparin drip was stopped at 945AM.   Past Medical History:  Diagnosis Date  . ACC/AHA stage C congestive heart failure due to ischemic cardiomyopathy ([place]) 01/31/2009   Qualifier: Diagnosis of  By: Olevia Perches, MD, Glenetta Hew   . Acute CVA (cerebrovascular accident) ([place]) 03/31/2016  . Acute hemorrhagic infarction of brain ([place])   . Acute kidney injury ([place]) 03/26/2017  . ANXIETY 01/31/2009   Qualifier: Diagnosis of  By: Lovette Cliche, CNA, Christy    . Arthritis    "qwhere" (03/30/2016)  . Atrial fibrillation ([place])    Postoperative in 2009;  S/P transesophageal echocardiography-guided cardioversion, previously on Amiodarone and Coumadin; recurrent in April 2013  . Benign essential HTN   . CAD (coronary artery disease) of artery bypass graft 05/2008   S/P CABG in 2009  . Cerebrovascular accident (CVA) due to embolism of right middle cerebral artery ([place])   . CHF (congestive heart failure) ([place])   . Chronic anticoagulation   . Chronic kidney disease (CKD), stage III (moderate)   . Chronic lower back pain   . Chronic pain syndrome   . CKD (chronic kidney disease) stage 3, GFR 30-59 ml/min 03/30/2016  . Coronary artery disease involving coronary bypass graft  of native heart without angina pectoris   . DM type 2 with diabetic peripheral neuropathy ([place])   . Facial weakness, post-stroke   . Gastrointestinal hemorrhage 03/26/2017  . Heart murmur   . Hemiparesis ([place])   . History of CVA with residual deficit 03/26/2017  . Hyperlipidemia    . Hypertension   . Ischemic cardiomyopathy   . Morbid obesity ([place]) 03/26/2017  . Obesities, morbid ([place])   . Persistent atrial fibrillation ([place])   . Right middle cerebral artery stroke ([place]) 04/04/2016  . Stroke ([place])   . Systolic heart failure   . TACHYCARDIA, HX OF 01/31/2009   Qualifier: Diagnosis of  By: Lovette Cliche, CNA, Christy    . Tachypnea   . Thrombocytopenia ([place])   . TIA (transient ischemic attack) 03/30/2016  . Type II diabetes mellitus ([place])   . UTI (urinary tract infection) 04/01/2016    Past Surgical History:  Procedure Laterality Date  . APPENDECTOMY  1946  . CATARACT EXTRACTION Right 2012  . COLONOSCOPY WITH PROPOFOL Left 03/27/2017   Procedure: COLONOSCOPY WITH PROPOFOL;  Surgeon: Ronnette Juniper, MD;  Location: [place];  Service: Gastroenterology;  Laterality: Left;  . CORONARY ANGIOPLASTY WITH STENT PLACEMENT  2009   "put 2 stents in"  . HERNIA REPAIR    . JOINT REPLACEMENT    . TEE WITH CARDIOVERSION     Postoperative in 2009;  S/P transesophageal echocardiography-guided cardioversion,  . TOTAL KNEE ARTHROPLASTY Right 1994  . VENTRAL HERNIA REPAIR  10/1999   With mesh    Allergies: Benazepril; Angiotensin receptor blockers; Fe-succ-c-thre-b12-des stomach; Spironolactone; and Sulfamethoxazole-trimethoprim  Medications: Prior to Admission medications   Medication Sig Start Date End Date Taking? Authorizing Provider  apixaban (ELIQUIS) 5 MG TABS tablet Take 1 tablet (5 mg total) by mouth 2 (two) times daily. 04/26/16  Yes Kirsteins, Luanna Salk, MD  aspirin EC 81 MG tablet Take 1 tablet (81 mg total) by mouth daily. 08/27/16  Yes Burnell Blanks, MD  atorvastatin (LIPITOR) 40 MG tablet Take 1 tablet (40 mg total) by mouth daily at 6 PM. Patient taking differently: Take 40 mg by mouth at bedtime.  04/12/16  Yes Angiulli, Lavon Paganini, PA-C  carvedilol (COREG) 6.25 MG tablet Take 1 tablet (6.25 mg total) by mouth 2 (two) times daily with a meal. 04/12/16  Yes  Angiulli, Lavon Paganini, PA-C  cholecalciferol (VITAMIN D) 1000 UNITS tablet Take 1,000 Units by mouth 2 (two) times daily.   Yes [provider]  docusate sodium (COLACE) 100 MG capsule Take 200 mg by mouth at bedtime.    Yes [provider]  furosemide (LASIX) 40 MG tablet Take 1 tablet (40 mg total) by mouth daily. 04/12/16  Yes Angiulli, Lavon Paganini, PA-C  gabapentin (NEURONTIN) 400 MG capsule Take 1 capsule (400 mg total) by mouth at bedtime. Patient taking differently: Take 400 mg by mouth 2 (two) times daily.  04/12/16  Yes Angiulli, Lavon Paganini, PA-C  insulin glargine (LANTUS) 100 UNIT/ML injection Inject 0.18 mLs (18 Units total) into the skin at bedtime. Patient taking differently: Inject 23 Units into the skin at bedtime.  04/12/16  Yes Angiulli, Lavon Paganini, PA-C  neomycin-bacitracin-polymyxin (NEOSPORIN) OINT Apply 1 application topically 3 (three) times daily as needed for wound care.   Yes [provider]  nitroGLYCERIN (NITROSTAT) 0.4 MG SL tablet Place 0.4 mg under the tongue every 5 (five) minutes as needed for chest pain (up to 3 doses).    Yes [provider]  potassium  chloride SA (K-DUR,KLOR-CON) 20 MEQ tablet Take 1 tablet (20 mEq total) by mouth daily. 04/12/16  Yes Angiulli, Lavon Paganini, PA-C  rOPINIRole (REQUIP) 0.5 MG tablet Take 1 mg by mouth at bedtime. 02/04/17  Yes [provider]  silver sulfADIAZINE (SILVADENE) 1 % cream Apply 1 application topically 3 (three) times daily as needed (wound care).   Yes [provider]  traMADol (ULTRAM) 50 MG tablet Take 50-100 mg by mouth See admin instructions. Take 2 tablets (100 mg) by mouth every morning and 1 tablet (50 mg) at night   Yes [provider]  vitamin B-12 (CYANOCOBALAMIN) 1000 MCG tablet Take 1,000 mcg by mouth daily.   Yes [provider]     Family History  Problem Relation Age of Onset  . Clotting disorder Mother        Cerebral hemorrhage  . Emphysema Father         COD  . Lung cancer Brother   . Heart disease Neg Hx     Social History   Social History  . Marital status: Widowed    Spouse name: N/A  . Number of children: N/A  . Years of education: N/A   Social History Main Topics  . Smoking status: Never Smoker  . Smokeless tobacco: Never Used  . Alcohol use No  . Drug use: No  . Sexual activity: Not Currently   Other Topics Concern  . None   Social History Narrative   Widowed   Lives alone in [place]   2 children   Not routinely exercising     Review of Systems  Constitutional: Negative for fatigue and fever.  Respiratory: Negative for cough and shortness of breath.   Cardiovascular: Negative for chest pain.  Gastrointestinal: Positive for abdominal pain (PTA).  Psychiatric/Behavioral: Negative for behavioral problems and confusion.    Vital Signs: BP (!) 129/51   Pulse 60   Temp 98.2 F (36.8 C) (Oral)   Resp 16   Ht 5\' 10"  (1.778 m)   Wt 298 lb (135.2 kg)   SpO2 97%   BMI 42.76 kg/m   Physical Exam  Constitutional: She is oriented to person, place, and time. She appears well-developed.  Cardiovascular: Normal rate, regular rhythm and normal heart sounds.   Pulmonary/Chest: Effort normal and breath sounds normal.  Abdominal: Soft. There is no tenderness.  Neurological: She is alert and oriented to person, place, and time.  Skin: Skin is warm and dry.  Psychiatric: She has a normal mood and affect. Her behavior is normal. Judgment and thought content normal.  Nursing note and vitals reviewed.   Mallampati Score:  MD Evaluation Airway: WNL Heart: WNL Abdomen: WNL Chest/ Lungs: WNL ASA  Classification: Per MD or Designee  Imaging: Nm Gi Blood Loss  Result Date: 03/27/2017 CLINICAL DATA:  Pain this rectal bleeding. Colonoscopy showed blood clots in the entire colon with some diverticulosis. EXAM: NUCLEAR MEDICINE GASTROINTESTINAL BLEEDING SCAN TECHNIQUE: Sequential abdominal images were obtained  following intravenous administration of Tc-11m labeled red blood cells. RADIOPHARMACEUTICALS:  25.0 mCi Tc-47m in-vitro labeled red cells. COMPARISON:  None. FINDINGS: Imaging was conducted over 2 hours. There is a focal area of progressive increased radiotracer activity in the right upper quadrant, which projects over the expected location of the right renal pelvis. This does not progressed distally as expected for the source of GI bleeding. The exact etiology of this focus of radiotracer accumulation is unclear over the past evaluated in conjunction with an abdomen and  pelvis CT scan. There are no other areas of abnormal radiotracer accumulation. IMPRESSION: 1. There is a focal area of progressive increased radiotracer activity within the right mid to upper quadrant, that may correspond to the right kidney in the right renal pelvis. This is felt unlikely to reflect an area of GI bleeding, since it does not progressed distally. It is of unclear etiology, however. Recommend followup abdomen and pelvis CT with contrast to correlate with this exam. 2. No other areas of abnormal radiotracer accumulation. Electronically Signed   By: Lajean Manes M.D.   On: 03/27/2017 20:44   Ct Abdomen Pelvis W Contrast  Result Date: 03/28/2017 CLINICAL DATA:  The patient has GI bleeding and right renal activity on packed red blood cell scan, could conceivably represent bleeding, for further workup. EXAM: CT ABDOMEN AND PELVIS WITH CONTRAST TECHNIQUE: Multidetector CT imaging of the abdomen and pelvis was performed using the standard protocol following bolus administration of intravenous contrast. CONTRAST:  17mL ISOVUE-300 IOPAMIDOL (ISOVUE-300) INJECTION 61% COMPARISON:  Tagged red blood cell scan of 03/27/2017 ; abdominal ultrasound from 07/26/2005 FINDINGS: Lower chest: Coronary atherosclerosis. Mitral annular calcification. Moderate cardiomegaly. Trace bilateral pleural effusions. Hepatobiliary: Multiple gallstones measuring  up to 2.9 cm in diameter. No biliary dilatation. Pancreas: Unremarkable Spleen: Unremarkable Adrenals/Urinary Tract: Adrenal glands normal. The right kidney has an exaggerated anterior- posterior long axis, as shown on image 49/7. Vascular structures approach the kidney from the upper portion of the renal hilum, correlating with the focus of activity in the right upper quadrant, which probably represents activity in the right renal vein rather than collecting system activity. There is a tiny hypodense lesion of the upper anterior margin of the right kidney on image 34/3, technically too small to characterize but statistically likely to be a cyst. Similar lesion in the left kidney upper pole, technically too small to characterize. There is slight fullness of the right collecting system and right ureter extending down to the iliac vessel cross over. I do not see a stone or overt hydronephrosis. On the delayed images there is poor excretion without high density contrast medium in the collecting systems. This could reflect renal dysfunction. There is atrophy of both kidneys. Urinary bladder mildly distended but otherwise unremarkable. Stomach/Bowel: Several areas of questionable wall thickening along the nondistended haustra of the colon including the proximal transverse colon for example on image 29/6, difficult to exclude the possibility of followups given the nondistended nature the colon. By report the patient has had prior appendectomy. In the right lower quadrant there is a 2.8 by 3.7 by 2.3 cm (volume = 12 cm^3) complex lesion, oval in shape, near the cecum, with dense peripheral rim. There is a basic suggestion that the appendiceal stump extends towards this lesion. Well-defined continuity of this lesion with the cecum or small bowel is not identified. Vascular/Lymphatic: Aortoiliac atherosclerotic vascular disease. A fatty right inguinal node measures 1.3 cm in short axis on image 97/3. No overtly pathologic  adenopathy identified. Reproductive: Unremarkable Other: Mild edema/stranding in the pannus and lateral to the hips in the subcutaneous tissues. Musculoskeletal: Degenerative arthropathy of both hips. Lumbar spondylosis and degenerative disc disease likely causing impingement at all lumbar levels 2 varying degrees. IMPRESSION: 1. 3.7 cm right lower quadrant complex lesion with lower density centrally but peripheral high density in the right lower quadrant, potentially in the lower margin of the right perirenal space but also near the appendiceal stump. The patient has had prior appendectomy. This could represent a tumor/malignancy, complex  duplication cyst, or chronic complex postoperative collection. I am doubtful this is a Meckel's diverticulum is it appears to be separate from the bowel. This should be readily approachable percutaneously, if clinically warranted. 2. A definite site of GI bleeding is not seen, although there are some areas of haustral thickening along the colon which are probably due to nondistention but which could be due to small polyps. 3. Cholelithiasis. 4. No tumor of the right kidney is identified. The appearance of accentuated activity on recent red blood cell scan is believed to be due to the combination of the unusual anterior- posterior horizontal orientation of the long axis of the kidney, and the mild prominence of the renal vein along the upper portion of the renal hilum causing blood pool activity 2 appear focally prominent. 5. There is some delay in contrast excretion of both kidneys which could be from renal dysfunction. 6. Aortic Atherosclerosis (ICD10-I70.0). Coronary atherosclerosis with moderate cardiomegaly. 7. Trace bilateral pleural effusions. 8. Edema in the pannus and overlying the hips in the subcutaneous tissues, possibly from third spacing of fluid. 9. Mild fullness of the right renal collecting system and right proximal ureter extending down to the iliac vessel cross  over, cause uncertain. 10. Lumbar spondylosis and degenerative disc disease causing multilevel impingement. There is also degenerative arthropathy of both hips. Electronically Signed   By: Van Clines M.D.   On: 03/28/2017 16:38    Labs:  CBC:  Recent Labs  03/29/17 0922 03/30/17 0500 03/31/17 0933 04/01/17 0542  WBC 5.3 4.5 5.1 5.4  HGB 10.2* 9.2* 10.1* 9.3*  HCT 30.9* 29.0* 32.8* 29.7*  PLT 106* 102* 139* 121*    COAGS:  Recent Labs  03/25/17 2024 03/26/17 0430 03/31/17 0933  INR 1.64 1.55 1.36  APTT 37*  --   --     BMP:  Recent Labs  03/27/17 0323 03/28/17 0305 03/31/17 0933 04/01/17 0542  NA 139 140 138 137  K 3.6 3.8 3.2* 3.3*  CL 105 108 104 103  CO2 24 25 26 28   GLUCOSE 108* 103* 168* 155*  BUN 26* 21* 12 14  CALCIUM 8.5* 8.7* 8.8* 8.5*  CREATININE 1.07* 1.16* 1.33* 1.29*  GFRNONAA 48* 44* 37* 39*  GFRAA 56* 51* 43* 45*    LIVER FUNCTION TESTS:  Recent Labs  04/05/16 0516 03/25/17 1620  BILITOT 0.8 0.8  AST 31 27  ALT 18 14  ALKPHOS 120 99  PROT 6.3* 6.4*  ALBUMIN 2.6* 3.3*    TUMOR MARKERS: No results for input(s): AFPTM, CEA, CA199, CHROMGRNA in the last 8760 hours.  Assessment and Plan: RLQ mass Patient admitted with anemia and GI bleed.  CT Abd/Pelvis 03/28/17 showed RLQ complex lesion near prior appendiceal stump.   IR consulted for biopsy of RLQ mass prior to discharge today.  Case reviewed by Dr. Anselm Pancoast who feels patient is appropriate for procedure.  She has been NPO as of this morning.  She was on continuous heparin which was held as of 945AM.  Risks and benefits discussed with the patient including, but not limited to bleeding, infection, damage to adjacent structures or low yield requiring additional tests. All of the patient's questions were answered, patient is agreeable to proceed. Consent signed and in chart.   Thank you for this interesting consult.  I greatly enjoyed meeting [name] and look forward to  participating in their care.  A copy of this report was sent to the requesting provider on this date.  Electronically  Signed: Docia Barrier, PA 04/01/2017, 10:29 AM   I spent a total of 40 Minutes    in face to face in clinical consultation, greater than 50% of which was counseling/coordinating care for RLQ mass

## 2017-04-01 NOTE — Progress Notes (Deleted)
Subjective:  [name] is a 76 y.o. female who presents to the [facility] today for hospital follow up.  HPI:   ***HIST  Objective:  Physical Exam: There were no vitals taken for this visit.  Gen: ***NAD, resting comfortably CV: RRR with no murmurs appreciated Pulm: NWOB, CTAB with no crackles, wheezes, or rhonchi GI: Normal bowel sounds present. Soft, Nontender, Nondistended. MSK: no edema, cyanosis, or clubbing noted Skin: warm, dry Neuro: grossly normal, moves all extremities Psych: Normal affect and thought content  Results for orders placed or performed during the hospital encounter of 03/25/17 (from the past 72 hour(s))  Glucose, capillary     Status: None   Collection Time: 03/29/17 12:27 PM  Result Value Ref Range   Glucose-Capillary 99 65 - 99 mg/dL  Glucose, capillary     Status: Abnormal   Collection Time: 03/29/17  3:39 PM  Result Value Ref Range   Glucose-Capillary 147 (H) 65 - 99 mg/dL  Glucose, capillary     Status: Abnormal   Collection Time: 03/29/17  8:39 PM  Result Value Ref Range   Glucose-Capillary 206 (H) 65 - 99 mg/dL  Glucose, capillary     Status: Abnormal   Collection Time: 03/29/17  9:22 PM  Result Value Ref Range   Glucose-Capillary 198 (H) 65 - 99 mg/dL  Glucose, capillary     Status: Abnormal   Collection Time: 03/30/17 12:04 AM  Result Value Ref Range   Glucose-Capillary 112 (H) 65 - 99 mg/dL  Glucose, capillary     Status: Abnormal   Collection Time: 03/30/17  4:16 AM  Result Value Ref Range   Glucose-Capillary 104 (H) 65 - 99 mg/dL  CBC     Status: Abnormal   Collection Time: 03/30/17  5:00 AM  Result Value Ref Range   WBC 4.5 4.0 - 10.5 K/uL   RBC 3.26 (L) 3.87 - 5.11 MIL/uL   Hemoglobin 9.2 (L) 12.0 - 15.0 g/dL   HCT 29.0 (L) 36.0 - 46.0 %   MCV 89.0 78.0 - 100.0 fL   MCH 28.2 26.0 - 34.0 pg   MCHC 31.7 30.0 - 36.0 g/dL   RDW 15.1 11.5 - 15.5 %   Platelets 102 (L) 150 - 400 K/uL    Comment: CONSISTENT WITH PREVIOUS RESULT   Glucose, capillary     Status: None   Collection Time: 03/30/17  7:53 AM  Result Value Ref Range   Glucose-Capillary 90 65 - 99 mg/dL  Glucose, capillary     Status: Abnormal   Collection Time: 03/30/17  1:00 PM  Result Value Ref Range   Glucose-Capillary 101 (H) 65 - 99 mg/dL  Glucose, capillary     Status: Abnormal   Collection Time: 03/30/17  5:10 PM  Result Value Ref Range   Glucose-Capillary 217 (H) 65 - 99 mg/dL  Glucose, capillary     Status: Abnormal   Collection Time: 03/30/17  8:22 PM  Result Value Ref Range   Glucose-Capillary 154 (H) 65 - 99 mg/dL  Glucose, capillary     Status: Abnormal   Collection Time: 03/30/17 11:53 PM  Result Value Ref Range   Glucose-Capillary 144 (H) 65 - 99 mg/dL  Glucose, capillary     Status: Abnormal   Collection Time: 03/31/17  4:07 AM  Result Value Ref Range   Glucose-Capillary 115 (H) 65 - 99 mg/dL  Glucose, capillary     Status: Abnormal   Collection Time: 03/31/17  7:43 AM  Result  Value Ref Range   Glucose-Capillary 115 (H) 65 - 99 mg/dL  CBC     Status: Abnormal   Collection Time: 03/31/17  9:33 AM  Result Value Ref Range   WBC 5.1 4.0 - 10.5 K/uL   RBC 3.64 (L) 3.87 - 5.11 MIL/uL   Hemoglobin 10.1 (L) 12.0 - 15.0 g/dL   HCT 32.8 (L) 36.0 - 46.0 %   MCV 90.1 78.0 - 100.0 fL   MCH 27.7 26.0 - 34.0 pg   MCHC 30.8 30.0 - 36.0 g/dL   RDW 15.1 11.5 - 15.5 %   Platelets 139 (L) 150 - 400 K/uL  Basic metabolic panel     Status: Abnormal   Collection Time: 03/31/17  9:33 AM  Result Value Ref Range   Sodium 138 135 - 145 mmol/L   Potassium 3.2 (L) 3.5 - 5.1 mmol/L   Chloride 104 101 - 111 mmol/L   CO2 26 22 - 32 mmol/L   Glucose, Bld 168 (H) 65 - 99 mg/dL   BUN 12 6 - 20 mg/dL   Creatinine, Ser 1.33 (H) 0.44 - 1.00 mg/dL   Calcium 8.8 (L) 8.9 - 10.3 mg/dL   GFR calc non Af Amer 37 (L) >60 mL/min   GFR calc Af Amer 43 (L) >60 mL/min    Comment: (NOTE) The eGFR has been calculated using the CKD EPI equation. This calculation  has not been validated in all clinical situations. eGFR's persistently <60 mL/min signify possible Chronic Kidney Disease.    Anion gap 8 5 - 15  Protime-INR     Status: Abnormal   Collection Time: 03/31/17  9:33 AM  Result Value Ref Range   Prothrombin Time 16.9 (H) 11.4 - 15.2 seconds   INR 1.36   Glucose, capillary     Status: Abnormal   Collection Time: 03/31/17 12:34 PM  Result Value Ref Range   Glucose-Capillary 132 (H) 65 - 99 mg/dL  Glucose, capillary     Status: Abnormal   Collection Time: 03/31/17  5:20 PM  Result Value Ref Range   Glucose-Capillary 176 (H) 65 - 99 mg/dL  Glucose, capillary     Status: Abnormal   Collection Time: 03/31/17  8:13 PM  Result Value Ref Range   Glucose-Capillary 219 (H) 65 - 99 mg/dL  Glucose, capillary     Status: Abnormal   Collection Time: 04/01/17 12:08 AM  Result Value Ref Range   Glucose-Capillary 145 (H) 65 - 99 mg/dL  Glucose, capillary     Status: Abnormal   Collection Time: 04/01/17  4:31 AM  Result Value Ref Range   Glucose-Capillary 154 (H) 65 - 99 mg/dL  CBC     Status: Abnormal   Collection Time: 04/01/17  5:42 AM  Result Value Ref Range   WBC 5.4 4.0 - 10.5 K/uL   RBC 3.30 (L) 3.87 - 5.11 MIL/uL   Hemoglobin 9.3 (L) 12.0 - 15.0 g/dL   HCT 29.7 (L) 36.0 - 46.0 %   MCV 90.0 78.0 - 100.0 fL   MCH 28.2 26.0 - 34.0 pg   MCHC 31.3 30.0 - 36.0 g/dL   RDW 15.0 11.5 - 15.5 %   Platelets 121 (L) 150 - 400 K/uL  Basic metabolic panel     Status: Abnormal   Collection Time: 04/01/17  5:42 AM  Result Value Ref Range   Sodium 137 135 - 145 mmol/L   Potassium 3.3 (L) 3.5 - 5.1 mmol/L   Chloride 103 101 - 111  mmol/L   CO2 28 22 - 32 mmol/L   Glucose, Bld 155 (H) 65 - 99 mg/dL   BUN 14 6 - 20 mg/dL   Creatinine, Ser 1.29 (H) 0.44 - 1.00 mg/dL   Calcium 8.5 (L) 8.9 - 10.3 mg/dL   GFR calc non Af Amer 39 (L) >60 mL/min   GFR calc Af Amer 45 (L) >60 mL/min    Comment: (NOTE) The eGFR has been calculated using the CKD EPI  equation. This calculation has not been validated in all clinical situations. eGFR's persistently <60 mL/min signify possible Chronic Kidney Disease.    Anion gap 6 5 - 15  Heparin level (unfractionated)     Status: None   Collection Time: 04/01/17  5:42 AM  Result Value Ref Range   Heparin Unfractionated 0.46 0.30 - 0.70 IU/mL    Comment:        IF HEPARIN RESULTS ARE BELOW EXPECTED VALUES, AND PATIENT DOSAGE HAS BEEN CONFIRMED, SUGGEST FOLLOW UP TESTING OF ANTITHROMBIN III LEVELS.   Glucose, capillary     Status: Abnormal   Collection Time: 04/01/17  7:40 AM  Result Value Ref Range   Glucose-Capillary 147 (H) 65 - 99 mg/dL     Assessment/Plan:  No problem-specific Assessment & Plan notes found for this encounter.   Bufford Lope, DO PGY-***, [facility] 04/01/2017 11:06 AM

## 2017-04-01 NOTE — Progress Notes (Signed)
ANTICOAGULATION CONSULT NOTE - Initial Consult  Pharmacy Consult for heparin  Indication: atrial fibrillation   Patient Measurements: Height: 5\' 10"  (177.8 cm) Weight: 298 lb (135.2 kg) IBW/kg (Calculated) : 68.5 Heparin Dosing Weight: 100kg  Vital Signs: Temp: 98.2 F (36.8 C) (05/21 0800) Temp Source: Oral (05/21 0800) BP: 117/57 (05/21 1318) Pulse Rate: 68 (05/21 1318)  Labs:  Recent Labs  03/30/17 0500 03/31/17 0933 04/01/17 0542  HGB 9.2* 10.1* 9.3*  HCT 29.0* 32.8* 29.7*  PLT 102* 139* 121*  LABPROT  --  16.9*  --   INR  --  1.36  --   HEPARINUNFRC  --   --  0.46  CREATININE  --  1.33* 1.29*    Estimated Creatinine Clearance: 54 mL/min (A) (by C-G formula based on SCr of 1.29 mg/dL (H)).  Assessment: 76 yo female with anemia and concern for GIB (presented with bloody bowel movements x3) . She is on apixiban PTA (last dose taken 5/14). The CT abdomen shows a lesion and the patient is s/p biopsy/drainage today. Per IR, okay to resume Heparin 3 hours post-procedure (procedure completed at 1330, Hep to resume at 1630). RN instructed to continue at prior rate. Plans are likely to transition back to apixaban 5/22.  Goal of Therapy:  Heparin level= 0.3-0.5 (reduced goal due to concern of GIB) Monitor platelets by anticoagulation protocol: Yes   Plan:  1. Resume Heparin at 1200 units/hr (12 ml/hr) 2. Will continue to monitor for any signs/symptoms of bleeding and will follow up with heparin level in 8 hours   Thank you for allowing pharmacy to be a part of this patient's care.  Alycia Rossetti, PharmD, BCPS Clinical Pharmacist Pager: [phone] Clinical phone for 04/01/2017 from 7a-3:30p: [phone] If after 3:30p, please call main pharmacy at: [phone] 04/01/2017 4:34 PM

## 2017-04-01 NOTE — Progress Notes (Signed)
Family Medicine Teaching Service Daily Progress Note Intern Pager: [phone]  Patient name: [name] Medical record number: [id number] Date of birth: [birth year] Age: 76 y.o. Gender: female  Primary Care Provider: Patient, No Pcp Per Consultants: GI Code Status: DNI  Assessment and Plan: [name] a 76 y.o.femalepresenting with bloody bowel movements x3 and on Eliquis, last taken 5/14 am. PMH is significant for CAD with CABG 2009, ischemic cardiomyopathy, atrial fibrillation on Eliquis, systolic CHF (last echo 12/9526 EF 35-40% with diffuse hypokinesis and pulm HTN), HLD, DM, CKD, CVA.   #Anemia most likely 2/2 GI bleed, ongoing Hbg is 9.3 slightly down from 10.1. Patient is asymptomatic and stable. --Patient is currently on heparin instead of Eliquis since she is scheduled for a VIR procedure on Monday 5/21. Will restart Eliquis post procedure. --Continue protonix --Follow up on am CBC --Follow up on PT, INR  #RLQ Lesion, new finding Patient is scheduled for VIR procedure biopsy/drainage on Monday. -- Will restart Eliquis after procedure  #A.fib, rate controlled Patient was on Eliquis at home, last anticoagulation 5/14 am.  --Continue to hold Eliquis with procedure on Monday,   --Continue Carvedilol 6.25 mg bid  #HFpEF, mildly hypervolemic Patient is seeing Dr. Julianne Handler. Home meds coreg, ASA, statin, lasix 40 mg daily. Patient reports this is baseline volume status. Lungs clear, breathing at baseline. BNP 149.9. Cr 1.29. --Continue Lasix 40 mg daily  --Follow up on BNP  #Diabetes  A1c 6.5 this admission. --Continue SSI --Will titrate up insulin as needed since patient has a diet  #HTN BP 109/36. Patient has been asymptomatic Continue to monitor. --Continue Carvedilol 6.25 mg bid  #CAD/HLD CABG 2009, ischemic cardiomyopathy. Patient follows with Dr. Angelena Form outpatient.  --No ARB as an outpatient due to h/o hypotension --Continue lipitor 40 mg  daily  #CKD3 Followed with [facility] Dr. Audie Clear. Baseline in our system appears to be between 1.18 and 1.7. Creatine this am is 1.29 close to baseline. --Avoid nephrotoxic agents  FEN/GI: PPI, Heart healthy/Carb modified PPx: None  Disposition: anticipate discharge after VIR procedure.  Subjective:  Patient is doing well today, and is ready for her procedure. No major complaints. She reports some melena yesterday.    Objective: Temp:  [98 F (36.7 C)-98.6 F (37 C)] 98.6 F (37 C) (05/21 0430) Pulse Rate:  [58-73] 73 (05/21 0430) Resp:  [16-18] 16 (05/21 0430) BP: (100-133)/(36-76) 109/36 (05/21 0430) SpO2:  [98 %-100 %] 98 % (05/21 0430)  Physical Exam: General: NAD, pleasant, able to participate in exam Cardiac: RRR, normal heart sounds, no murmurs. 2+ radial and PT pulses bilaterally Respiratory: CTAB, normal effort, No wheezes, rales or rhonchi Abdomen: soft, nontender, nondistended, no hepatic or splenomegaly, +BS Extremities: no edema or cyanosis. WWP. Skin: warm and dry, no rashes noted Neuro: alert and oriented x4, no focal deficits Psych: Normal affect and mood  Laboratory:  Recent Labs Lab 03/30/17 0500 03/31/17 0933 04/01/17 0542  WBC 4.5 5.1 5.4  HGB 9.2* 10.1* 9.3*  HCT 29.0* 32.8* 29.7*  PLT 102* 139* 121*    Recent Labs Lab 03/25/17 1620  03/28/17 0305 03/31/17 0933 04/01/17 0542  NA 142  < > 140 138 137  K 3.9  < > 3.8 3.2* 3.3*  CL 106  < > 108 104 103  CO2 26  < > 25 26 28   BUN 36*  < > 21* 12 14  CREATININE 1.31*  < > 1.16* 1.33* 1.29*  CALCIUM 8.7*  < > 8.7* 8.8*  8.5*  PROT 6.4*  --   --   --   --   BILITOT 0.8  --   --   --   --   ALKPHOS 99  --   --   --   --   ALT 14  --   --   --   --   AST 27  --   --   --   --   GLUCOSE 250*  < > 103* 168* 155*  < > = values in this interval not displayed.  Imaging/Diagnostic Tests: No results found.  Marjie Skiff, MD 04/01/2017, 7:14 AM PGY-1, [facility] Intern pager: [phone], text pages welcome

## 2017-04-02 ENCOUNTER — Inpatient Hospital Stay: Payer: Medicare Other | Admitting: Family Medicine

## 2017-04-02 DIAGNOSIS — D62 Acute posthemorrhagic anemia: Secondary | ICD-10-CM

## 2017-04-02 DIAGNOSIS — Z7901 Long term (current) use of anticoagulants: Secondary | ICD-10-CM

## 2017-04-02 DIAGNOSIS — N183 Chronic kidney disease, stage 3 (moderate): Secondary | ICD-10-CM

## 2017-04-02 DIAGNOSIS — R58 Hemorrhage, not elsewhere classified: Secondary | ICD-10-CM

## 2017-04-02 DIAGNOSIS — E1142 Type 2 diabetes mellitus with diabetic polyneuropathy: Secondary | ICD-10-CM

## 2017-04-02 DIAGNOSIS — N179 Acute kidney failure, unspecified: Secondary | ICD-10-CM

## 2017-04-02 LAB — CBC
HCT: 32 % — ABNORMAL LOW (ref 36.0–46.0)
Hemoglobin: 9.9 g/dL — ABNORMAL LOW (ref 12.0–15.0)
MCH: 28.4 pg (ref 26.0–34.0)
MCHC: 30.9 g/dL (ref 30.0–36.0)
MCV: 92 fL (ref 78.0–100.0)
PLATELETS: 129 10*3/uL — AB (ref 150–400)
RBC: 3.48 MIL/uL — AB (ref 3.87–5.11)
RDW: 15.4 % (ref 11.5–15.5)
WBC: 5.5 10*3/uL (ref 4.0–10.5)

## 2017-04-02 LAB — BASIC METABOLIC PANEL
Anion gap: 7 (ref 5–15)
BUN: 16 mg/dL (ref 6–20)
CO2: 29 mmol/L (ref 22–32)
CREATININE: 1.13 mg/dL — AB (ref 0.44–1.00)
Calcium: 8.9 mg/dL (ref 8.9–10.3)
Chloride: 104 mmol/L (ref 101–111)
GFR calc Af Amer: 53 mL/min — ABNORMAL LOW (ref >60)
GFR, EST NON AFRICAN AMERICAN: 45 mL/min — AB (ref >60)
GLUCOSE: 129 mg/dL — AB (ref 65–99)
Potassium: 3.6 mmol/L (ref 3.5–5.1)
Sodium: 140 mmol/L (ref 135–145)

## 2017-04-02 LAB — GLUCOSE, CAPILLARY
GLUCOSE-CAPILLARY: 163 mg/dL — AB (ref 65–99)
Glucose-Capillary: 165 mg/dL — ABNORMAL HIGH (ref 65–99)

## 2017-04-02 LAB — HEPARIN LEVEL (UNFRACTIONATED)
Heparin Unfractionated: 0.45 IU/mL (ref 0.30–0.70)
Heparin Unfractionated: 0.54 IU/mL (ref 0.30–0.70)

## 2017-04-02 MED ORDER — INSULIN ASPART 100 UNIT/ML ~~LOC~~ SOLN
0.0000 [IU] | Freq: Three times a day (TID) | SUBCUTANEOUS | Status: DC
  Administered 2017-04-02: 1 [IU] via SUBCUTANEOUS
  Administered 2017-04-02: 2 [IU] via SUBCUTANEOUS

## 2017-04-02 MED ORDER — APIXABAN 5 MG PO TABS
5.0000 mg | ORAL_TABLET | Freq: Two times a day (BID) | ORAL | Status: DC
  Administered 2017-04-02: 5 mg via ORAL
  Filled 2017-04-02: qty 1

## 2017-04-02 MED ORDER — APIXABAN 5 MG PO TABS
5.0000 mg | ORAL_TABLET | Freq: Two times a day (BID) | ORAL | 0 refills | Status: DC
Start: 2017-04-02 — End: 2017-05-17

## 2017-04-02 NOTE — Discharge Instructions (Signed)
YOU HAD AN ENDOSCOPIC PROCEDURE TODAY: Refer to the procedure report and other information in the discharge instructions given to you for any specific questions about what was found during the examination. If this information does not answer your questions, please call [facility] office at [phone] to clarify.   YOU SHOULD EXPECT: Some feelings of bloating in the abdomen. Passage of more gas than usual. Walking can help get rid of the air that was put into your GI tract during the procedure and reduce the bloating. If you had a lower endoscopy (such as a colonoscopy or flexible sigmoidoscopy) you may notice spotting of blood in your stool or on the toilet paper. Some abdominal soreness may be present for a day or two, also.  DIET: Your first meal following the procedure should be a light meal and then it is ok to progress to your normal diet. A half-sandwich or bowl of soup is an example of a good first meal. Heavy or fried foods are harder to digest and may make you feel nauseous or bloated. Drink plenty of fluids but you should avoid alcoholic beverages for 24 hours. If you had a esophageal dilation, please see attached instructions for diet.   ACTIVITY: Your care partner should take you home directly after the procedure. You should plan to take it easy, moving slowly for the rest of the day. You can resume normal activity the day after the procedure however YOU SHOULD NOT DRIVE, use power tools, machinery or perform tasks that involve climbing or major physical exertion for 24 hours (because of the sedation medicines used during the test).   SYMPTOMS TO REPORT IMMEDIATELY: A gastroenterologist can be reached at any hour. Please call [phone]  for any of the following symptoms:  Following lower endoscopy (colonoscopy, flexible sigmoidoscopy) Excessive amounts of blood in the stool  Significant tenderness, worsening of abdominal pains  Swelling of the abdomen that is new, acute  Fever of 100 or  higher  Following upper endoscopy (EGD, EUS, ERCP, esophageal dilation) Vomiting of blood or coffee ground material  New, significant abdominal pain  New, significant chest pain or pain under the shoulder blades  Painful or persistently difficult swallowing  New shortness of breath  Black, tarry-looking or red, bloody stools  FOLLOW UP:  If any biopsies were taken you will be contacted by phone or by letter within the next 1-3 weeks. Call [phone]  if you have not heard about the biopsies in 3 weeks.  Please also call with any specific questions about appointments or follow up tests.  _________________________________________________________  Information on my medicine - ELIQUIS (apixaban)  This medication education was reviewed with me or my healthcare representative as part of my discharge preparation.    Why was Eliquis prescribed for you? Eliquis was prescribed for you to reduce the risk of a blood clot forming that can cause a stroke if you have a medical condition called atrial fibrillation (a type of irregular heartbeat).  What do You need to know about Eliquis ? Take your Eliquis TWICE DAILY - one tablet in the morning and one tablet in the evening with or without food. If you have difficulty swallowing the tablet whole please discuss with your pharmacist how to take the medication safely.  Take Eliquis exactly as prescribed by your doctor and DO NOT stop taking Eliquis without talking to the doctor who prescribed the medication.  Stopping may increase your risk of developing a stroke.  Refill your prescription before you run  out.  After discharge, you should have regular check-up appointments with your healthcare provider that is prescribing your Eliquis.  In the future your dose may need to be changed if your kidney function or weight changes by a significant amount or as you get older.  What do you do if you miss a dose? If you miss a dose, take it as soon as you  remember on the same day and resume taking twice daily.  Do not take more than one dose of ELIQUIS at the same time to make up a missed dose.  Important Safety Information A possible side effect of Eliquis is bleeding. You should call your healthcare provider right away if you experience any of the following: ? Bleeding from an injury or your nose that does not stop. ? Unusual colored urine (red or dark brown) or unusual colored stools (red or black). ? Unusual bruising for unknown reasons. ? A serious fall or if you hit your head (even if there is no bleeding).  Some medicines may interact with Eliquis and might increase your risk of bleeding or clotting while on Eliquis. To help avoid this, consult your healthcare provider or pharmacist prior to using any new prescription or non-prescription medications, including herbals, vitamins, non-steroidal anti-inflammatory drugs (NSAIDs) and supplements.  This website has more information on Eliquis (apixaban): http://www.eliquis.com/eliquis/home

## 2017-04-02 NOTE — Progress Notes (Signed)
ANTICOAGULATION CONSULT NOTE - Follow-up Consult  Pharmacy Consult for heparin  Indication: atrial fibrillation   Patient Measurements: Height: 5\' 10"  (177.8 cm) Weight: 298 lb (135.2 kg) IBW/kg (Calculated) : 68.5 Heparin Dosing Weight: 100kg  Vital Signs: Temp: 98.5 F (36.9 C) (05/22 0914) Temp Source: Oral (05/22 0914) BP: 116/74 (05/22 0914) Pulse Rate: 75 (05/22 0914)  Labs:  Recent Labs  03/31/17 0933 04/01/17 0542 04/02/17 0038 04/02/17 0801  HGB 10.1* 9.3*  --  9.9*  HCT 32.8* 29.7*  --  32.0*  PLT 139* 121*  --  129*  LABPROT 16.9*  --   --   --   INR 1.36  --   --   --   HEPARINUNFRC  --  0.46 0.45 0.54  CREATININE 1.33* 1.29*  --  1.13*    Estimated Creatinine Clearance: 61.7 mL/min (A) (by C-G formula based on SCr of 1.13 mg/dL (H)).  Assessment: 76 yo female with anemia and concern for GIB (presented with bloody bowel movements x3). She is on apixiban PTA (last dose taken 5/14). The CT abdomen shows a lesion and the patient is s/p biopsy/drainage 5/21. Heparin resumed post-procedure.   Heparin level remains therapeutic (0.54units/mL) on gtt at 1200 units/hr. CBC stable, No bleeding noted.  Goal of Therapy:  Heparin level= 0.3-0.5 (reduced goal due to concern of GIB) Monitor platelets by anticoagulation protocol: Yes   Plan:  Continue Heparin at 1200 units/hr (12 ml/hr) Daily heparin level and CBC F/u plans to transition back to apixaban- noted likely to transition today, but no orders entered at this time  Thank you for allowing pharmacy to be a part of this patient's care.  Demetri Kerman D. Charnita Trudel, PharmD, BCPS Clinical Pharmacist Pager: [phone] Clinical Phone for 04/02/2017 until 3:30pm: [phone] If after 3:30pm, please call main pharmacy at [phone] 04/02/2017 10:05 AM

## 2017-04-02 NOTE — Progress Notes (Signed)
Pt requested that her medical records and labs be sent to her PCP Dr. Audie Clear at [facility] in [facility].   Paulla Fore, RN

## 2017-04-02 NOTE — Progress Notes (Signed)
Family Medicine Teaching Service Daily Progress Note Intern Pager: [phone]  Patient name: [name] Medical record number: [id number] Date of birth: [birth year] Age: 76 y.o. Gender: female  Primary Care Provider: Patient, No Pcp Per Consultants: GI Code Status: DNI  Assessment and Plan: ANAI LIPSON a 76 y.o.femalepresenting with bloody bowel movements x3 and on Eliquis, last taken 5/14 am. PMH is significant for CAD with CABG 2009, ischemic cardiomyopathy, atrial fibrillation on Eliquis, systolic CHF (last echo 04/7671 EF 35-40% with diffuse hypokinesis and pulm HTN), HLD, DM, CKD, CVA.   #Anemia most likely 2/2 GI bleed, ongoing Hbg is 9.3 slightly down from 10.1. Patient is asymptomatic and stable. --Will restart Eliquis 5mg  bid today  --Continue protonix --Follow up on am CBC --Follow up on PT, INR  #RLQ Lesion, new finding Patient had 3.7cm RLQ lesion biopsied yesterday by VIR. 4 samples were obtained with possible central necrosis at the core of the lesion. --Follow up on surgical pathology --Follow up with GI prior to discharge  #A.fib, rate controlled Patient was on Eliquis at home, last anticoagulation 5/14 am.  --Restart Eliquis 5 mg bid --Continue Carvedilol 6.25 mg bid  #HFpEF, mildly hypervolemic Patient is seeing Dr. Julianne Handler. Home meds coreg, ASA, statin, lasix 40 mg daily. Patient reports this is baseline volume status. Lungs clear, breathing at baseline. BNP 149.9. Cr 1.29. --Continue Lasix 40 mg daily  --Follow up on BMP  #Diabetes  A1c 6.5 this admission. Last CBG 205. --Continue SSI --Will titrate up insulin as needed since patient has a diet  #HTN BP 112/50. Patient has been asymptomatic Continue to monitor. --Continue Carvedilol 6.25 mg bid  #CAD/HLD CABG 2009, ischemic cardiomyopathy. Patient follows with Dr. Angelena Form outpatient.  --No ARB as an outpatient due to h/o hypotension --Continue lipitor 40 mg daily  #CKD3 Followed  with [facility] Dr. Audie Clear. Baseline in our system appears to be between 1.18 and 1.7. Creatine this am is 1.29 close to baseline. --Avoid nephrotoxic agents  FEN/GI: PPI, Heart healthy/Carb modified PPx: None  Disposition: Anticipate discharge home today   Subjective:  Patient is feeling well today, she denies any blood in her stool, or melena. Patient is ambulating and taking good po. Denies SOB and abdominal pain.   Objective: Temp:  [98.2 F (36.8 C)-98.5 F (36.9 C)] 98.5 F (36.9 C) (05/21 2011) Pulse Rate:  [60-73] 60 (05/21 2011) Resp:  [12-18] 18 (05/21 2011) BP: (105-138)/(31-62) 112/50 (05/21 2011) SpO2:  [97 %-100 %] 99 % (05/21 2011)  Physical Exam: General: NAD, pleasant, able to participate in exam Cardiac: RRR, normal heart sounds, no murmurs. 2+ radial and PT pulses bilaterally Respiratory: CTAB, normal effort, No wheezes, rales or rhonchi Abdomen: soft, nontender, nondistended, no hepatic or splenomegaly, +BS Extremities: no edema or cyanosis. WWP. Skin: warm and dry, no rashes noted Neuro: alert and oriented x4, no focal deficits Psych: Normal affect and mood  Laboratory:  Recent Labs Lab 03/30/17 0500 03/31/17 0933 04/01/17 0542  WBC 4.5 5.1 5.4  HGB 9.2* 10.1* 9.3*  HCT 29.0* 32.8* 29.7*  PLT 102* 139* 121*    Recent Labs Lab 03/28/17 0305 03/31/17 0933 04/01/17 0542  NA 140 138 137  K 3.8 3.2* 3.3*  CL 108 104 103  CO2 25 26 28   BUN 21* 12 14  CREATININE 1.16* 1.33* 1.29*  CALCIUM 8.7* 8.8* 8.5*  GLUCOSE 103* 168* 155*    Imaging/Diagnostic Tests: Ct Biopsy  Result Date: 04/01/2017 INDICATION: 76 year-old with an indeterminate lesion  in the right lower quadrant of the abdomen. EXAM: CT-GUIDED BIOPSY OF ABDOMINAL/PERITONEAL  LESION MEDICATIONS: None. ANESTHESIA/SEDATION: Moderate (conscious) sedation was employed during this procedure. A total of Versed 2.0 mg and Fentanyl 100 mcg was administered intravenously. Moderate  Sedation Time: 30 minutes. The patient's level of consciousness and vital signs were monitored continuously by radiology nursing throughout the procedure under my direct supervision. FLUOROSCOPY TIME:  None COMPLICATIONS: None immediate. PROCEDURE: Informed written consent was obtained from the patient after a thorough discussion of the procedural risks, benefits and alternatives. All questions were addressed. A timeout was performed prior to the initiation of the procedure. Patient was placed supine on the CT scanner. Images through the lower abdomen were obtained. The oval shaped lesion in the right lower quadrant was targeted. The right side of the abdomen was prepped with chlorhexidine and a sterile field was created. Skin and soft tissues were anesthetized with 1% lidocaine. 17 gauge coaxial needle was directed towards the lesion with CT guidance. Needle was placed along the periphery of the lesion. A total of 4 core biopsies were obtained with an 18 gauge core device. Needle removed without complication. Specimens placed in formalin. Bandage placed over the puncture site. FINDINGS: 3.5 cm lesion with central low density in the right lower quadrant. Needle was placed along the lateral aspect of the lesion and 4 core biopsies were obtained. It was difficult to sample the central aspect of the lesion with the 17 gauge coaxial needle because the lesion appeared to be pushing away from the needle. Small amount of gas within the lesion following removal of the coaxial needle. Suspect this lesion has central necrosis or fluid. IMPRESSION: CT-guided core biopsy of the right lower quadrant abdominal lesion. Electronically Signed   By: Markus Daft M.D.   On: 04/01/2017 14:49    Marjie Skiff, MD 04/02/2017, 7:12 AM PGY-1, [place] Intern pager: [phone], text pages welcome

## 2017-04-02 NOTE — Progress Notes (Signed)
Inpatient Diabetes Program Recommendations  AACE/ADA: New Consensus Statement on Inpatient Glycemic Control (2015)  Target Ranges:  Prepandial:   less than 140 mg/dL      Peak postprandial:   less than 180 mg/dL (1-2 hours)      Critically ill patients:  140 - 180 mg/dL  Results for [name], [name] (MRN [id number]) as of 04/02/2017 11:05  Ref. Range 04/02/2017 08:01  Glucose Latest Ref Range: 65 - 99 mg/dL 129 (H)   Results for [name], [name] (MRN [id number]) as of 04/02/2017 11:05  Ref. Range 04/01/2017 04:31 04/01/2017 07:40 04/01/2017 15:50 04/01/2017 17:32 04/01/2017 20:16  Glucose-Capillary Latest Ref Range: 65 - 99 mg/dL 154 (H) 147 (H) 148 (H) 215 (H) 205 (H)   Results for [name], [name] (MRN [id number]) as of 04/02/2017 11:05  Ref. Range 03/26/2017 04:30  Hemoglobin A1C Latest Ref Range: 4.8 - 5.6 % 6.5 (H)   Review of Glycemic Control  Diabetes history: DM2 Outpatient Diabetes medications: Lantus 23 units QHS Current orders for Inpatient glycemic control: Lantus 8 units QHS, Novolog 0-9 units TID with meals  Inpatient Diabetes Program Recommendations: Correction (SSI): Please consider ordering Novolog 0-5 untis QHS for bedtime correction scale. Insulin - Meal Coverage: Please consider ordering Novolog 3 units TID with meals for meal coverage if patient eats at least 50% of meals.  Thanks, Barnie Alderman, RN, MSN, CDE Diabetes Coordinator Inpatient Diabetes Program [phone] (Team Pager from 8am to 5pm)

## 2017-04-02 NOTE — Progress Notes (Signed)
Transitions of Care Pharmacy Note  Plan:  Educated on resumption of Eliquis and holding ASA  Outpatient follow-up: s/s bleeding, CBC prn  --------------------------------------------- [name] is an 76 y.o. female who presents with a chief complaint of GIB. In anticipation of discharge, pharmacy has reviewed this patient's prior to admission medication history, as well as current inpatient medications listed per the [facility].  Current medication indications, dosing, frequency, and notable side effects reviewed with patient and family. patient and family verbalized understanding of current inpatient medication regimen and is aware that the After Visit Summary when presented, will represent the most accurate medication list at discharge.   [name] did not express any concerns regarding her medications. She is aware her Eliquis will resume at the same dose. We discussed potential side effects and reasons to contact her PCP/come to ED, if needed. She is aware the primary inpatient team is planning to hold her ASA and that she should f/u with her cardiologist re: resuming this medication.   Patient reports no further questions or concerns at the conclusion of our visit.   Assessment: Understanding of regimen: Excellent  Understanding of indications: excellent Potential of compliance: excellent  Patient instructed to contact inpatient pharmacy team with further questions or concerns if needed.    Time spent preparing for discharge counseling: 10 min  Time spent counseling patient: 20 min    Argie Ramming, PharmD Pharmacy Resident  Pager [phone] 04/02/17 3:16 PM

## 2017-04-02 NOTE — Progress Notes (Signed)
ANTICOAGULATION CONSULT NOTE - Follow-up Consult  Pharmacy Consult for heparin  Indication: atrial fibrillation   Patient Measurements: Height: 5\' 10"  (177.8 cm) Weight: 298 lb (135.2 kg) IBW/kg (Calculated) : 68.5 Heparin Dosing Weight: 100kg  Vital Signs: Temp: 98.5 F (36.9 C) (05/21 2011) Temp Source: Oral (05/21 2011) BP: 112/50 (05/21 2011) Pulse Rate: 60 (05/21 2011)  Labs:  Recent Labs  03/30/17 0500 03/31/17 0933 04/01/17 0542 04/02/17 0038  HGB 9.2* 10.1* 9.3*  --   HCT 29.0* 32.8* 29.7*  --   PLT 102* 139* 121*  --   LABPROT  --  16.9*  --   --   INR  --  1.36  --   --   HEPARINUNFRC  --   --  0.46 0.45  CREATININE  --  1.33* 1.29*  --     Estimated Creatinine Clearance: 54 mL/min (A) (by C-G formula based on SCr of 1.29 mg/dL (H)).  Assessment: 76 yo female with anemia and concern for GIB (presented with bloody bowel movements x3) . She is on apixiban PTA (last dose taken 5/14). The CT abdomen shows a lesion and the patient is s/p biopsy/drainage 5/21. Heparin resumed post-procedure. Plans are likely to transition back to apixaban 5/22.  Heparin level therapeutic (0.45) on gtt at 1200 units/hr. No bleeding noted.  Goal of Therapy:  Heparin level= 0.3-0.5 (reduced goal due to concern of GIB) Monitor platelets by anticoagulation protocol: Yes   Plan:  Continue Heparin at 1200 units/hr (12 ml/hr) F/u daily heparin level F/u plans to transition back to apixaban  Thank you for allowing pharmacy to be a part of this patient's care.  Sherlon Handing, PharmD, BCPS Clinical pharmacist, pager [phone] 04/02/2017 1:17 AM

## 2017-04-02 NOTE — Consult Note (Signed)
[facility] CM Primary Care Navigator  04/02/2017  [name] [birth year] [id number]   Met with patient and son Mortimer Fries) at the bedside to identify possible discharge needs. Patient reports having blood in stool for 3 days requiring blood transfusion which had led to this admission. Patient states being seen at [facility] by Shelby Mattocks or Daphene Jaeger but this practice is closing according to patient. Son mentioned that patient is in the process of getting a new primary care provider from [facility] practice.   Patient shared using [facility] pharmacy and [facility] service  to obtain medications without any problem.   Patient reportsmanaging her own medications at home using "pill box"systemonce a week.   Patient's sonprovides transportation to her doctors' appointments.  Son lives with patient and he is the primary caregiver at home. Patient mentioned having a  private duty caregiver on Tuesdays and Thursdays at 10 am (for an hour and 15 minutes) to assist patient with showers, making her bed and a little cleaning.  Discharge plan is home with home health services per son.   Patient and son voiced understanding to follow-up with primary care provider and call when she returns back home, to schedule a post discharge follow-up appointment within a week or sooner if needs arise.Patient letter (with PCP's contact number) was provided as their reminder.  Explained to patient and son about [facility] CM services available for health management but patient states that DM is under control and being managed with insulin (pen), diet, activities, monitoring/ recording blood sugar results and follow-up with provider. Her most recent A1c is 6.5. Patient and son denied any further needs in managing HF at home as well. Both expressed understanding to request from primary care provider for a referral to [facility] CM services if needed in the future.  [facility] care  management contact information provided for future needs that she may have.   For questions, please contact:  Dannielle Huh, BSN, RN- [facility] Primary Care Navigator  Telephone: [phone] [place]

## 2017-04-02 NOTE — Progress Notes (Signed)
[name] to be D/C'd Home per MD order.  Discussed prescriptions and follow up appointments with the patient. Prescriptions given to patient, medication list explained in detail. Pt verbalized understanding.  Allergies as of 04/02/2017      Reactions   Benazepril Other (See Comments)   Unknown reaction at age 76-65 - possibly dizziness   Angiotensin Receptor Blockers Other (See Comments)   Hypotension reaction   Fe-succ-c-thre-b12-des Stomach Other (See Comments)   Unknown reaction   Spironolactone Other (See Comments)   Dizziness   Sulfamethoxazole-trimethoprim Other (See Comments)   Unknown reaction      Medication List    TAKE these medications   apixaban 5 MG Tabs tablet Commonly known as:  ELIQUIS Take 1 tablet (5 mg total) by mouth 2 (two) times daily.   aspirin EC 81 MG tablet Take 1 tablet (81 mg total) by mouth daily.   atorvastatin 40 MG tablet Commonly known as:  LIPITOR Take 1 tablet (40 mg total) by mouth daily at 6 PM. What changed:  when to take this   carvedilol 6.25 MG tablet Commonly known as:  COREG Take 1 tablet (6.25 mg total) by mouth 2 (two) times daily with a meal.   cholecalciferol 1000 units tablet Commonly known as:  VITAMIN D Take 1,000 Units by mouth 2 (two) times daily.   docusate sodium 100 MG capsule Commonly known as:  COLACE Take 200 mg by mouth at bedtime.   furosemide 40 MG tablet Commonly known as:  LASIX Take 1 tablet (40 mg total) by mouth daily.   gabapentin 400 MG capsule Commonly known as:  NEURONTIN Take 1 capsule (400 mg total) by mouth at bedtime. What changed:  when to take this   insulin glargine 100 UNIT/ML injection Commonly known as:  LANTUS Inject 0.18 mLs (18 Units total) into the skin at bedtime. What changed:  how much to take   neomycin-bacitracin-polymyxin Oint Commonly known as:  NEOSPORIN Apply 1 application topically 3 (three) times daily as needed for wound care.   nitroGLYCERIN 0.4 MG SL  tablet Commonly known as:  NITROSTAT Place 0.4 mg under the tongue every 5 (five) minutes as needed for chest pain (up to 3 doses).   potassium chloride SA 20 MEQ tablet Commonly known as:  K-DUR,KLOR-CON Take 1 tablet (20 mEq total) by mouth daily.   rOPINIRole 0.5 MG tablet Commonly known as:  REQUIP Take 1 mg by mouth at bedtime.   silver sulfADIAZINE 1 % cream Commonly known as:  SILVADENE Apply 1 application topically 3 (three) times daily as needed (wound care).   traMADol 50 MG tablet Commonly known as:  ULTRAM Take 50-100 mg by mouth See admin instructions. Take 2 tablets (100 mg) by mouth every morning and 1 tablet (50 mg) at night   vitamin B-12 1000 MCG tablet Commonly known as:  CYANOCOBALAMIN Take 1,000 mcg by mouth daily.       Vitals:   04/02/17 0833 04/02/17 0914  BP: 109/63 116/74  Pulse: 66 75  Resp:  18  Temp:  98.5 F (36.9 C)    Skin clean, dry and intact without evidence of skin break down, no evidence of skin tears noted. IV catheter discontinued intact. Site without signs and symptoms of complications. Dressing and pressure applied. Pt denies pain at this time. No complaints noted.  An After Visit Summary was printed and given to the patient. Patient escorted via [place], and D/C home via private auto.  Emilio Math,  RN [place] Phone [phone]

## 2017-04-04 ENCOUNTER — Telehealth: Payer: Self-pay | Admitting: Cardiovascular Disease

## 2017-04-04 NOTE — Telephone Encounter (Signed)
Pt.notified

## 2017-04-04 NOTE — Telephone Encounter (Signed)
OK to hold ASA. cdm 

## 2017-04-04 NOTE — Telephone Encounter (Signed)
Patient states that she was recently in the hosptial for having blood in stool. Patient was advised to check with Dr. Angelena Form to see if it is okay to stop taking baby aspirin. Please call to discuss,thanks.

## 2017-04-04 NOTE — Telephone Encounter (Signed)
I spoke with pt. She reports recent hospitalization for GI bleeding. Notes in EPIC.  Pt reports Eliquis and ASA were stopped during hospitalization. Colonoscopy done.  She is waiting to hear biopsy results.  She was discharged on 5/22 and Eliquis was resumed yesterday.  She is not having any bleeding problems at this time.  Is scheduled to see GI on 5/30.  She was told to check with Dr. Angelena Form to see if OK to continue to hold ASA.

## 2017-04-09 DIAGNOSIS — D500 Iron deficiency anemia secondary to blood loss (chronic): Secondary | ICD-10-CM | POA: Diagnosis not present

## 2017-04-09 DIAGNOSIS — N183 Chronic kidney disease, stage 3 (moderate): Secondary | ICD-10-CM | POA: Diagnosis not present

## 2017-04-09 DIAGNOSIS — I129 Hypertensive chronic kidney disease with stage 1 through stage 4 chronic kidney disease, or unspecified chronic kidney disease: Secondary | ICD-10-CM | POA: Diagnosis not present

## 2017-04-09 DIAGNOSIS — E1122 Type 2 diabetes mellitus with diabetic chronic kidney disease: Secondary | ICD-10-CM | POA: Diagnosis not present

## 2017-04-09 DIAGNOSIS — I4891 Unspecified atrial fibrillation: Secondary | ICD-10-CM | POA: Diagnosis not present

## 2017-04-10 DIAGNOSIS — K5731 Diverticulosis of large intestine without perforation or abscess with bleeding: Secondary | ICD-10-CM | POA: Diagnosis not present

## 2017-04-10 DIAGNOSIS — Q438 Other specified congenital malformations of intestine: Secondary | ICD-10-CM | POA: Diagnosis not present

## 2017-04-12 ENCOUNTER — Telehealth: Payer: Self-pay | Admitting: General Practice

## 2017-04-12 NOTE — Telephone Encounter (Signed)
R/S HFU to 04/26/17 with Dr. Juanito Doom. She relies on her son to take her to her appts so she may call back to reschedule for a different morning appt. - Mesha Guinyard

## 2017-04-17 DIAGNOSIS — H353122 Nonexudative age-related macular degeneration, left eye, intermediate dry stage: Secondary | ICD-10-CM | POA: Diagnosis not present

## 2017-04-17 DIAGNOSIS — E113312 Type 2 diabetes mellitus with moderate nonproliferative diabetic retinopathy with macular edema, left eye: Secondary | ICD-10-CM | POA: Diagnosis not present

## 2017-04-17 DIAGNOSIS — H353221 Exudative age-related macular degeneration, left eye, with active choroidal neovascularization: Secondary | ICD-10-CM | POA: Diagnosis not present

## 2017-04-17 DIAGNOSIS — H43822 Vitreomacular adhesion, left eye: Secondary | ICD-10-CM | POA: Diagnosis not present

## 2017-04-22 ENCOUNTER — Ambulatory Visit: Payer: Medicare Other | Admitting: Neurology

## 2017-04-22 DIAGNOSIS — T300 Burn of unspecified body region, unspecified degree: Secondary | ICD-10-CM | POA: Diagnosis not present

## 2017-04-22 DIAGNOSIS — Z760 Encounter for issue of repeat prescription: Secondary | ICD-10-CM | POA: Diagnosis not present

## 2017-04-22 DIAGNOSIS — G8929 Other chronic pain: Secondary | ICD-10-CM | POA: Diagnosis not present

## 2017-04-25 ENCOUNTER — Telehealth: Payer: Self-pay | Admitting: General Practice

## 2017-04-25 NOTE — Telephone Encounter (Signed)
Pre-visit call. Appointment confirmed for 04/26/17. Patient was advised to arrive early for check-in and to bring all current medications.

## 2017-04-26 ENCOUNTER — Encounter: Payer: Self-pay | Admitting: Family Medicine

## 2017-04-26 ENCOUNTER — Ambulatory Visit (INDEPENDENT_AMBULATORY_CARE_PROVIDER_SITE_OTHER): Payer: Medicare Other | Admitting: Family Medicine

## 2017-04-26 VITALS — BP 115/60 | HR 73 | Temp 97.9°F | Ht 69.5 in | Wt 296.8 lb

## 2017-04-26 DIAGNOSIS — K625 Hemorrhage of anus and rectum: Secondary | ICD-10-CM

## 2017-04-26 DIAGNOSIS — D649 Anemia, unspecified: Secondary | ICD-10-CM | POA: Diagnosis not present

## 2017-04-26 NOTE — Progress Notes (Signed)
Subjective:    Patient ID: [name] , female   DOB: 1941-10-10 , 76 y.o..   MRN: [id number]  HPI  KAELEEN ODOM is here for  Chief Complaint  Patient presents with  . Hospitalization Follow-up   1. Hospital Follow up: [name] a 76 y.o.femalewho presented with bloody bowel movements on Eliquis and hospitalized for this on May 14 through May 22. PMH is significant for CAD with CABG 2009, ischemic cardiomyopathy, atrial fibrillation on Eliquis, systolic CHF (last echo 0/2409 EF 35-40% with diffuse hypokinesis and pulm HTN), HLD, DM, CKD, CVA.    Brief Hospital course: Patient admitted after a hemoglobin of 7.5 and bloody mouth bowel movements. She was given 2 units of packed red blood cells and her hemoglobin improved to 8.9. Her Eliquiss that is used for atrial fibrillation was held. Colonoscopy was performed by GI and showed blood throughout colon as well as some diverticulosis, it was thought that her bleeding may be due to diverticular bleeds. Tagged red blood cell showed  Increased uptake in right upper quadrant. CT abdomen was ordered for further characterization and there is noted to be at 3.7 lesion in the right lower quadrant concerning for malignancy. Biopsy was performed. Patient restarted on Eliquis prior to discharge with a hgb of 9.9. She is not on ASA anymore.   Since hospital discharge, patient has been doing well. She denies any bloody bowel movements. She denies any fever, dizziness, fatigue, nausea, vomiting, abdominal pain, constipation, diarrhea. She notes that she did see her GI doctor 2 weeks ago and they told her that everything looked okay and she could follow up with them in 6 months. They also commented that the biopsy that she had was negative for cancer.  Patient notes that she is not on aspirin anymore but is continuing to take the Eliquis which she has taken since 2017. She follows up with cardiology in 4 months.  She has an appointment with her new  PCP Dr. Orland Mustard next month.  Review of Systems: Per HPI.   Past Medical History: Patient Active Problem List   Diagnosis Date Noted  . Abdominal mass, RLQ (right lower quadrant)   . Acute kidney injury ([place]) 03/26/2017  . History of CVA with residual deficit 03/26/2017  . Bleeding 03/26/2017  . Pulmonary hypertension ([place]) 03/26/2017  . Morbid obesity ([place]) 03/26/2017  . Rectal bleeding   . Acute blood loss anemia 03/25/2017  . Persistent atrial fibrillation ([place])   . Chronic pain syndrome   . Coronary artery disease involving coronary bypass graft of native heart without angina pectoris   . Benign essential HTN   . DM type 2 with diabetic peripheral neuropathy ([place])   . Thrombocytopenia ([place])   . CKD (chronic kidney disease) stage 3, GFR 30-59 ml/min 03/30/2016  . Chronic anticoagulation 03/30/2016  . Hyperlipidemia 01/31/2009  . ACC/AHA stage C congestive heart failure due to ischemic cardiomyopathy ([place]) 01/31/2009    Medications: reviewed   Social Hx:  reports that she has never smoked. She has never used smokeless tobacco.   Objective:   BP 115/60 (BP Location: Left Wrist, Patient Position: Sitting, Cuff Size: Normal)   Pulse 73   Temp 97.9 F (36.6 C) (Oral)   Ht 5' 9.5" (1.765 m)   Wt 296 lb 12.8 oz (134.6 kg)   SpO2 98%   BMI 43.20 kg/m  Physical Exam  Gen: NAD, alert, cooperative with exam, well-appearing, Morbidly obese HEENT: NCAT, PERRL, clear conjunctiva,  oropharynx clear, supple neck, poor dentition Cardiac: Irregular rhythm but rate controlled, normal S1/S2, 1+ pitting edema in bilateral ankles, capillary refill brisk  Respiratory: Clear to auscultation bilaterally, no wheezes, non-labored breathing Gastrointestinal: soft, non tender, non distended, bowel sounds present Skin: Mild bruising on left lower arm Psych: good insight, normal mood and affect  Assessment & Plan:  Rectal bleeding  Doing well since being discharged from the hospital on May  22nd. GI bleeding has resolved. Per patient report, she has already followed up with GI who said she was doing well and they would like to see her in 6 months. Biopsy of GI lesion was benign. Not taking ASA, only Eliquis - Follow up with new PCP Dr. Orland Mustard at the end of the month - Return precautions discussed - Check CBC and BMP today - Continue Eliquis for now for a-fib, no ASA  Orders Placed This Encounter  Procedures  . CBC  . Basic Metabolic Panel    Smitty Cords, MD [place], PGY-2

## 2017-04-26 NOTE — Assessment & Plan Note (Addendum)
Doing well since being discharged from the hospital on May 22nd. GI bleeding has resolved. Per patient report, she has already followed up with GI who said she was doing well and they would like to see her in 6 months. Biopsy of GI lesion was benign. Not taking ASA, only Eliquis - Follow up with new PCP Dr. Orland Mustard at the end of the month - Return precautions discussed - Check CBC and BMP today - Continue Eliquis for now for a-fib, no ASA

## 2017-04-26 NOTE — Patient Instructions (Signed)
Thank you for coming in today, it was so nice to see you! Today we talked about:    Hospital follow-up: I'm glad you are doing well and feeling well. We will check your blood today to make sure that your hemoglobin is stable. You have any more bloody bowel movements, dizziness, lightheadedness, please go back to the emergency department. Otherwise he can follow up with her new PCP when the time comes.  If we ordered any tests today, you will be notified via telephone of any abnormalities. If everything is normal you will get a letter in the mail.   If you have any questions or concerns, please do not hesitate to call the office at [phone]. You can also message me directly via MyChart.   Sincerely,  Smitty Cords, MD

## 2017-04-27 LAB — CBC
Hematocrit: 32.3 % — ABNORMAL LOW (ref 34.0–46.6)
Hemoglobin: 10.1 g/dL — ABNORMAL LOW (ref 11.1–15.9)
MCH: 27.7 pg (ref 26.6–33.0)
MCHC: 31.3 g/dL — AB (ref 31.5–35.7)
MCV: 89 fL (ref 79–97)
PLATELETS: 143 10*3/uL — AB (ref 150–379)
RBC: 3.64 x10E6/uL — ABNORMAL LOW (ref 3.77–5.28)
RDW: 15.5 % — ABNORMAL HIGH (ref 12.3–15.4)
WBC: 5.6 10*3/uL (ref 3.4–10.8)

## 2017-04-27 LAB — BASIC METABOLIC PANEL
BUN / CREAT RATIO: 23 (ref 12–28)
BUN: 29 mg/dL — AB (ref 8–27)
CHLORIDE: 105 mmol/L (ref 96–106)
CO2: 24 mmol/L (ref 20–29)
Calcium: 9.3 mg/dL (ref 8.7–10.3)
Creatinine, Ser: 1.27 mg/dL — ABNORMAL HIGH (ref 0.57–1.00)
GFR calc non Af Amer: 41 mL/min/{1.73_m2} — ABNORMAL LOW (ref >59)
GFR, EST AFRICAN AMERICAN: 47 mL/min/{1.73_m2} — AB (ref >59)
GLUCOSE: 85 mg/dL (ref 65–99)
Potassium: 4.3 mmol/L (ref 3.5–5.2)
SODIUM: 144 mmol/L (ref 134–144)

## 2017-04-28 ENCOUNTER — Encounter: Payer: Self-pay | Admitting: Family Medicine

## 2017-05-07 ENCOUNTER — Encounter: Payer: Self-pay | Admitting: Neurology

## 2017-05-07 ENCOUNTER — Ambulatory Visit (INDEPENDENT_AMBULATORY_CARE_PROVIDER_SITE_OTHER): Payer: Medicare Other | Admitting: Neurology

## 2017-05-07 VITALS — BP 107/59 | HR 70 | Ht 70.0 in | Wt 302.0 lb

## 2017-05-07 DIAGNOSIS — I481 Persistent atrial fibrillation: Secondary | ICD-10-CM

## 2017-05-07 DIAGNOSIS — E785 Hyperlipidemia, unspecified: Secondary | ICD-10-CM

## 2017-05-07 DIAGNOSIS — I4819 Other persistent atrial fibrillation: Secondary | ICD-10-CM

## 2017-05-07 DIAGNOSIS — Z7901 Long term (current) use of anticoagulants: Secondary | ICD-10-CM

## 2017-05-07 DIAGNOSIS — I63411 Cerebral infarction due to embolism of right middle cerebral artery: Secondary | ICD-10-CM

## 2017-05-07 DIAGNOSIS — D649 Anemia, unspecified: Secondary | ICD-10-CM | POA: Diagnosis not present

## 2017-05-07 NOTE — Progress Notes (Signed)
STROKE NEUROLOGY FOLLOW UP NOTE  NAME: [name] DOB: [birth year]  REASON FOR VISIT: stroke follow up HISTORY FROM: pt and chart  Today we had the pleasure of seeing [name] in follow-up at our Neurology Clinic. Pt was accompanied by son.   History Summary [name] is a 76 y.o. female with history of CAD/MI s/p CABG, ischemic cardiomyopathy, CHF, atrial fibrillation on coumadin therapy, HLD, obesity, HTN, DM, previous TIA, and CKD admitted on 04/01/16 for left facial weakness, slurred speech in the setting of subtherapeutic INR. MRI showed Acute/subacute infarcts involve the right caudate head, lentiform nucleus, insular cortex, and portions of the right temporal lobe. Hemorrhagic conversion is noted within the right basal ganglia infarcts. CTA head and neck showed right proximal M2 occlusion with distal reconstitution. And CT repeat showed stable hematoma. TTE EF 35-40% down from previous 40-45%. A1C 8.1 and LDL 53. Her ASA and coumadin were on hold due to hemorrhagic transformation. She was discharged to CIR with zocor and plan to start eliquis in 7 days with repeat CT.   06/21/16 follow up - the patient has been doing well. Stayed in CIR for 2 weeks and repeat CT head stable evolving hematoma at right BG and caudate head. eliquis 5mg  bid started in CIR but not ASA. She followed with cardiology later and kept on eliquis but did not mention ASA. Currently, pt left sided weakness resolved, walk with walker. BP 102/61 and glucose at home 110-120 and recent A1C 6.6.   10/22/17 follow up - she has been doing well. No recurrent stroke like symptoms. Followed with cardiology and added ASA 81mg  in addition to eliquis for cardiac prevention. BP today 138/75 and she said her BP at home stable about the same. Her glucose at home ranging from 100-120. She still has bilateral hand tingling from DM neuropathy and on neurontin 400mg  Qhs. She is going to follow up with her PCP.   Interval  History During the interval time, pt has been doing well from stroke standpoint. No stroke like symptoms. However, she had rectal bleeding in 03/2017 and was admitted for blood transfusion. Her ASA and eliquis were stopped and further work up showed abdominal mass s/p needle biopsy turned out to be benign smooth muscle cyst. Her eliquis was resumed but not ASA. She is about to see her cardiologist to discuss about ASA. Most recent Hb 10.1. BP today 107/59 and as per pt her most recent A1C was 6.2. She is walking with walker at home.   REVIEW OF SYSTEMS: Full 14 system review of systems performed and notable only for those listed below and in HPI above, all others are negative:  Constitutional:   Cardiovascular: swelling in legs Ear/Nose/Throat:   Skin:  Eyes:   Respiratory:   Gastroitestinal:   Genitourinary:  Hematology/Lymphatic:     Endocrine: cold intolerance, excessive thirst Musculoskeletal: back pain, muscle cramps Allergy/Immunology:   Neurological:   Psychiatric:  Sleep: restless leg  The following represents the patient's updated allergies and side effects list: Allergies  Allergen Reactions  . Benazepril Other (See Comments)    Unknown reaction at age 55-65 - possibly dizziness  . Angiotensin Receptor Blockers Other (See Comments)    Hypotension reaction  . Fe-Succ-C-Thre-B12-Des Stomach Other (See Comments)    Unknown reaction  . Spironolactone Other (See Comments)    Dizziness   . Sulfamethoxazole-Trimethoprim Other (See Comments)    Unknown reaction    The neurologically relevant items on the  patient's problem list were reviewed on today's visit.  Neurologic Examination  A problem focused neurological exam (12 or more points of the single system neurologic examination, vital signs counts as 1 point, cranial nerves count for 8 points) was performed.  Blood pressure (!) 107/59, pulse 70, height 5\' 10"  (1.778 m), weight (!) 302 lb (137 kg).  General - obese, well  developed, in no apparent distress.  Ophthalmologic - Fundi not visualized due to noncooperation.  Cardiovascular - irregularly irregular heart rate and rhythm.  Mental Status -  Level of arousal and orientation to time, place, and person were intact. Language including expression, naming, repetition, comprehension was assessed and found intact. Fund of Knowledge was assessed and was intact.  Cranial Nerves II - XII - II - Visual field intact OU. III, IV, VI - Extraocular movements intact. V - Facial sensation intact bilaterally. VII - Facial movement intact bilaterally. VIII - Hearing & vestibular intact bilaterally. X - Palate elevates symmetrically. XI - Chin turning & shoulder shrug intact bilaterally. XII - Tongue protrusion intact.  Motor Strength - The patient's strength was 5/5 BUEs and 4/5 BLEs and pronator drift was absent.  Bulk was normal and fasciculations were absent.   Motor Tone - Muscle tone was assessed at the neck and appendages and was normal.  Reflexes - The patient's reflexes were 1+ in all extremities and she had no pathological reflexes.  Sensory - Light touch, temperature/pinprick were assessed and were normal.    Coordination - The patient had normal movements in the hands with no ataxia or dysmetria.  Tremor was absent.  Gait and Station - walk with walker, slow but steady.   Data reviewed: I personally reviewed the images and agree with the radiology interpretations.  Ct Angio Head and neck W/cm &/or Wo Cm 03/30/2016   1. Right proximal M2 occlusion with distal reconstitution. There is completed infarct in the right caudate, putamen, internal capsule, and posterior insula.  2. No flow limiting stenosis or embolic source identified in the neck.   Ct Head Wo Contrast 03/30/2016   1. Focal hypodensity within the right basal ganglia, highly suspicious for evolving ischemia. Possible extension into the right subinsular region and insular cortex may be  present as well. No associated hemorrhage.  2. Asymmetric hyperdensity within the distal right M1 segment, which may reflect intraluminal thrombus versus atheromatous plaque.  3. Remote pontine infarct.  4. Generalized cerebral atrophy with chronic microvascular ischemic disease and intracranial atherosclerosis.   Mr Brain Wo Contrast 03/30/2016   Hemorrhagic conversion is noted within the right basal ganglia infarcts since the earlier CT scan.   Edema and mass effect is noted with the petechial hemorrhage and infarct.  There is partial effacement of the right lateral ventricle. T2 changes are associated with the areas of acute infarct. Impression 1. Acute/subacute infarcts involve the right caudate head, lentiform nucleus, insular cortex, and portions of the right temporal lobe.  2. Moderate generalized atrophy and diffuse white matter disease likely reflects the sequela of chronic microvascular ischemia in addition to the acute infarcts.   Transthoracic echocardiogram 03/30/2016 Study Conclusions - Left ventricle: The cavity size was normal. Wall thickness was  normal. Systolic function was moderately reduced. The estimated  ejection fraction was in the range of 35% to 40%. Diffuse  hypokinesis. Doppler parameters are consistent with high  ventricular filling pressure. - Aortic valve: There was trivial regurgitation. - Mitral valve: Calcified annulus. There was mild regurgitation. - Left atrium: The atrium  was mildly dilated. - Right atrium: The atrium was mildly dilated. - Tricuspid valve: There was moderate regurgitation. - Pulmonary arteries: Systolic pressure was moderately increased.  PA peak pressure: 49 mm Hg (S). Impressions: - Technically difficult; definity used; moderate global LV  dysfunction (EF 49); trace AI; mild MR; mild biatrial  enlargement; moderate TR; moderately elevated pulmonary pressure.  Ct Head Wo Contrast  03/31/2016  IMPRESSION: 1. Stable  appearance of hemorrhagic infarcts involving the right lentiform nucleus and caudate head without expansion. 2. Expected evolution of nonhemorrhagic infarcts involving the posterior right insular cortex and temporal lobe. 3. Stable white matter disease without other acute infarct.   Component     Latest Ref Rng & Units 03/30/2016  Cholesterol     0 - 200 mg/dL 102  Triglycerides     <150 mg/dL 50  HDL Cholesterol     >40 mg/dL 39 (L)  Total CHOL/HDL Ratio     RATIO 2.6  VLDL     0 - 40 mg/dL 10  LDL (calc)     0 - 99 mg/dL 53  Hemoglobin A1C     4.8 - 5.6 % 8.1 (H)  Mean Plasma Glucose     mg/dL 186    Assessment: As you may recall, she is a 76 y.o. Caucasian female with PMH of CAD/MI s/p CABG, ischemic cardiomyopathy, CHF, atrial fibrillation on coumadin therapy, HLD, obesity, HTN, DM, previous TIA, and CKD admitted on 04/01/16 for acute/subacute infarcts involve the right caudate head, lentiform nucleus, insular cortex, and portions of the right temporal lobe in the setting of subtherapeutic INR. Hemorrhagic conversion is noted within the right basal ganglia infarcts. CTA head and neck showed right proximal M2 occlusion with distal reconstitution. And CT repeat showed stable hematoma. TTE EF 35-40% down from previous 40-45%. A1C 8.1 and LDL 53. Her ASA and coumadin were on hold due to hemorrhagic transformation. She was discharged to CIR with zocor and started eliquis in CIR with stable repeat CT. She followed with cardiology later and also put back on ASA 81mg . Her left sided weakness resolved, walk with walker. BP and glucose controlled well. Developed rectal bleeding in 03/2017, GI work up showed abdominal mass s/p biopsy confirmed benign smooth muscle cyst. Continued on eliquis but not ASA. Pending cardiology follow up.   Plan:  - continue eliquis and lipitor for stroke prevention - follow up Cardiology about ASA use and repeat heart ultrasound  - check BP and glucose at home - Follow  up with your primary care physician for stroke risk factor modification. Recommend maintain blood pressure goal <130/80, diabetes with hemoglobin A1c goal below 7.0% and lipids with LDL cholesterol goal below 70 mg/dL.  - healthy diet and home exercise - avoid falls use walker for imbalance - follow up in 6 months  I spent more than 25 minutes of face to face time with the patient. Greater than 50% of time was spent in counseling and coordination of care. We discussed cardiology follow up, repeat TTE, bleeding precautions and BP / glucose monitoring  No orders of the defined types were placed in this encounter.   No orders of the defined types were placed in this encounter.   Patient Instructions  - continue eliquis and lipitor for stroke prevention - follow up Cardiology about ASA use and repeat heart ultrasound  - check BP and glucose at home - Follow up with your primary care physician for stroke risk factor modification. Recommend maintain blood pressure goal <  130/80, diabetes with hemoglobin A1c goal below 7.0% and lipids with LDL cholesterol goal below 70 mg/dL.  - healthy diet and home exercise - avoid falls use walker for imbalance - follow up in 6 months   Rosalin Hawking, MD PhD [facility] [address], [place], [place] [address] [phone]

## 2017-05-07 NOTE — Patient Instructions (Addendum)
-   continue eliquis and lipitor for stroke prevention - follow up Cardiology about ASA use and repeat heart ultrasound  - check BP and glucose at home - Follow up with your primary care physician for stroke risk factor modification. Recommend maintain blood pressure goal <130/80, diabetes with hemoglobin A1c goal below 7.0% and lipids with LDL cholesterol goal below 70 mg/dL.  - healthy diet and home exercise - avoid falls use walker for imbalance - follow up in 6 months

## 2017-05-16 ENCOUNTER — Telehealth: Payer: Self-pay | Admitting: General Practice

## 2017-05-16 NOTE — Telephone Encounter (Signed)
Has new pt appt with [facility] 05-22-17. She needs refill on her eliquis.  [facility]

## 2017-05-17 MED ORDER — APIXABAN 5 MG PO TABS: 5.0000 mg | ORAL_TABLET | Freq: Two times a day (BID) | ORAL | 0 refills | Status: DC

## 2017-05-17 NOTE — Telephone Encounter (Signed)
Medication refilled

## 2017-05-22 ENCOUNTER — Encounter: Payer: Self-pay | Admitting: Family Medicine

## 2017-05-22 ENCOUNTER — Ambulatory Visit (INDEPENDENT_AMBULATORY_CARE_PROVIDER_SITE_OTHER): Payer: Medicare Other | Admitting: Family Medicine

## 2017-05-22 DIAGNOSIS — Z7689 Persons encountering health services in other specified circumstances: Secondary | ICD-10-CM | POA: Diagnosis not present

## 2017-05-22 DIAGNOSIS — I10 Essential (primary) hypertension: Secondary | ICD-10-CM

## 2017-05-22 NOTE — Progress Notes (Signed)
HPI:  [name] is a 76 y.o. year old female with past medical history of hypertension, stroke, CHF, atrial fibrillation on chronic anticoagulation, CABG, type 2 diabetes who presents today for a new patient appointment to establish general primary care, also to discuss:  1. Hypertension Blood pressure at home: Does not check usually but states it's always on the lower end at the doctor's office Exercise: Limited due to back pain Low salt diet: compliant sometimes Medications: Compliant with coreg 6.25 mg BID, Lasix 40 mg daily Side effects: None ROS: Denies headache, dizziness, visual changes, nausea, vomiting, chest pain, abdominal pain or shortness of breath. BP Readings from Last 3 Encounters:  05/22/17 (!) 110/52  05/07/17 (!) 107/59  04/26/17 115/60    ROS: See HPI  Past Medical Hx:  Past Medical History:  Diagnosis Date  . ACC/AHA stage C congestive heart failure due to ischemic cardiomyopathy ([place]) 01/31/2009   Qualifier: Diagnosis of  By: Olevia Perches, MD, Glenetta Hew   . Acute CVA (cerebrovascular accident) ([place]) 03/31/2016  . Acute hemorrhagic infarction of brain ([place])   . Acute kidney injury ([place]) 03/26/2017  . ANXIETY 01/31/2009   Qualifier: Diagnosis of  By: Lovette Cliche, CNA, Christy    . Arthritis    "qwhere" (03/30/2016)  . Atrial fibrillation ([place])    Postoperative in 2009;  S/P transesophageal echocardiography-guided cardioversion, previously on Amiodarone and Coumadin; recurrent in April 2013  . Benign essential HTN   . CAD (coronary artery disease) of artery bypass graft 05/2008   S/P CABG in 2009  . Cerebrovascular accident (CVA) due to embolism of right middle cerebral artery ([place])   . CHF (congestive heart failure) ([place])   . Chronic anticoagulation   . Chronic kidney disease (CKD), stage III (moderate)   . Chronic lower back pain   . Chronic pain syndrome   . CKD (chronic kidney disease) stage 3, GFR 30-59 ml/min 03/30/2016  . Coronary artery disease  involving coronary bypass graft of native heart without angina pectoris   . DM type 2 with diabetic peripheral neuropathy ([place])   . Facial weakness, post-stroke   . Gastrointestinal hemorrhage 03/26/2017  . Heart murmur   . Hemiparesis ([place])   . History of CVA with residual deficit 03/26/2017  . Hyperlipidemia   . Hypertension   . Ischemic cardiomyopathy   . Morbid obesity ([place]) 03/26/2017  . Obesities, morbid ([place])   . Persistent atrial fibrillation ([place])   . Right middle cerebral artery stroke ([place]) 04/04/2016  . Stroke ([place])   . Systolic heart failure   . TACHYCARDIA, HX OF 01/31/2009   Qualifier: Diagnosis of  By: Lovette Cliche, CNA, Christy    . Tachypnea   . Thrombocytopenia ([place])   . TIA (transient ischemic attack) 03/30/2016  . Type II diabetes mellitus ([place])   . UTI (urinary tract infection) 04/01/2016    Past Surgical Hx:  Past Surgical History:  Procedure Laterality Date  . APPENDECTOMY  1946  . CATARACT EXTRACTION Right 2012  . COLONOSCOPY WITH PROPOFOL Left 03/27/2017   Procedure: COLONOSCOPY WITH PROPOFOL;  Surgeon: Ronnette Juniper, MD;  Location: [facility];  Service: Gastroenterology;  Laterality: Left;  . CORONARY ANGIOPLASTY WITH STENT PLACEMENT  2009   "put 2 stents in"  . HERNIA REPAIR    . JOINT REPLACEMENT    . TEE WITH CARDIOVERSION     Postoperative in 2009;  S/P transesophageal echocardiography-guided cardioversion,  . TOTAL KNEE ARTHROPLASTY Right 1994  . VENTRAL HERNIA REPAIR  10/1999  With mesh    Family Hx: updated in Epic  Social Hx:  Social History   Social History  . Marital status: Widowed    Spouse name: N/A  . Number of children: N/A  . Years of education: N/A   Occupational History  . Not on file.   Social History Main Topics  . Smoking status: Never Smoker  . Smokeless tobacco: Never Used  . Alcohol use No  . Drug use: No  . Sexual activity: Not Currently   Other Topics Concern  . Not on file   Social History Narrative   Widowed     Lives alone in [place]   2 children   Not routinely exercising    Health Maintenance:  Health Maintenance Due  Topic Date Due  . FOOT EXAM  03/26/1949  . OPHTHALMOLOGY EXAM  03/26/1949  . URINE MICROALBUMIN  03/26/1949  . TETANUS/TDAP  03/26/1958  . DEXA SCAN  03/26/2004  . PNA vac Low Risk Adult (2 of 2 - PCV13) 07/14/2015    Current Outpatient Prescriptions:  .  apixaban (ELIQUIS) 5 MG TABS tablet, Take 1 tablet (5 mg total) by mouth 2 (two) times daily., Disp: 60 tablet, Rfl: 0 .  atorvastatin (LIPITOR) 40 MG tablet, Take 1 tablet (40 mg total) by mouth daily at 6 PM. (Patient taking differently: Take 40 mg by mouth at bedtime. ), Disp: 30 tablet, Rfl: 0 .  carvedilol (COREG) 6.25 MG tablet, Take 1 tablet (6.25 mg total) by mouth 2 (two) times daily with a meal., Disp: 60 tablet, Rfl: 6 .  cholecalciferol (VITAMIN D) 1000 UNITS tablet, Take 1,000 Units by mouth 2 (two) times daily., Disp: , Rfl:  .  docusate sodium (COLACE) 100 MG capsule, Take 200 mg by mouth at bedtime. , Disp: , Rfl:  .  furosemide (LASIX) 40 MG tablet, Take 1 tablet (40 mg total) by mouth daily., Disp: 30 tablet, Rfl: 0 .  gabapentin (NEURONTIN) 400 MG capsule, Take 1 capsule (400 mg total) by mouth at bedtime. (Patient taking differently: Take 400 mg by mouth 2 (two) times daily. ), Disp: 30 capsule, Rfl: 1 .  insulin glargine (LANTUS) 100 UNIT/ML injection, Inject 0.18 mLs (18 Units total) into the skin at bedtime. (Patient taking differently: Inject 23 Units into the skin at bedtime. ), Disp: 10 mL, Rfl: 11 .  neomycin-bacitracin-polymyxin (NEOSPORIN) OINT, Apply 1 application topically 3 (three) times daily as needed for wound care., Disp: , Rfl:  .  nitroGLYCERIN (NITROSTAT) 0.4 MG SL tablet, Place 0.4 mg under the tongue every 5 (five) minutes as needed for chest pain (up to 3 doses). , Disp: , Rfl:  .  potassium chloride SA (K-DUR,KLOR-CON) 20 MEQ tablet, Take 1 tablet (20 mEq total) by mouth daily.,  Disp: 30 tablet, Rfl: 1 .  rOPINIRole (REQUIP) 0.5 MG tablet, Take 1 mg by mouth at bedtime., Disp: , Rfl:  .  silver sulfADIAZINE (SILVADENE) 1 % cream, Apply 1 application topically 3 (three) times daily as needed (wound care)., Disp: , Rfl:  .  traMADol (ULTRAM) 50 MG tablet, Take 1-2 tablets (50-100 mg total) by mouth See admin instructions. Take 2 tablets (100 mg) by mouth every morning and 1 tablet (50 mg) at night, Disp: 30 tablet, Rfl: 2 .  vitamin B-12 (CYANOCOBALAMIN) 1000 MCG tablet, Take 1,000 mcg by mouth daily., Disp: , Rfl:    PHYSICAL EXAM: BP (!) 110/52   Pulse 74   Temp 98.2 F (36.8 C) (Oral)  Ht 5\' 10"  (1.778 m)   Wt (!) 307 lb 3.2 oz (139.3 kg)   SpO2 98%   BMI 44.08 kg/m  Gen: NAD, alert, cooperative with exam, well-appearing, Morbidly obese HEENT: NCAT, PERRL, clear conjunctiva, oropharynx clear, supple neck, poor dentition Cardiac: Irregular rhythm but rate controlled, normal S1/S2, 1+ pitting edema in bilateral ankles, capillary refill brisk  Respiratory: Clear to auscultation bilaterally, no wheezes, non-labored breathing Gastrointestinal: soft, non tender, non distended, bowel sounds present Psych: good insight, normal mood and affect  ASSESSMENT/PLAN:  Encounter to establish care Established care at our clinic today. All medical history updated in Epic. Patient overall doing well. Her and her son are going to fill out a living will, paperwork provided to them. Return to clinic in 3 months for diabetes management. She notes she is going to follow up with her cardiologist in the next month and has been following up with a neurologist and nephrologist. Saw GI earlier in the year after having a GI bleed, they stated she did not need follow up.   Benign essential HTN Low normal BP of 110/52. No hypotensive symptoms. Medications include coreg 6.25 mg BID, Lasix 40 mg daily. Will leave medications as they are for now as patient has hx of CAD with CABG and CHF.  Patient to follow up with cardiologist.     FOLLOW UP: Follow up in 3 months for management of diabetes.   Smitty Cords, MD PGY-3 [place]

## 2017-05-23 MED ORDER — TRAMADOL HCL 50 MG PO TABS: 50.0000 mg | ORAL_TABLET | ORAL | 2 refills | Status: DC

## 2017-05-24 NOTE — Anesthesia Postprocedure Evaluation (Signed)
Anesthesia Post Note  Patient: BRANDII LAKEY  Procedure(s) Performed: Procedure(s) (LRB): COLONOSCOPY WITH PROPOFOL (Left)     Anesthesia Post Evaluation  Last Vitals:  Vitals:   04/02/17 0833 04/02/17 0914  BP: 109/63 116/74  Pulse: 66 75  Resp:  18  Temp:  36.9 C    Last Pain:  Vitals:   04/02/17 0914  TempSrc: Oral  PainSc:                  Riccardo Dubin

## 2017-05-24 NOTE — Addendum Note (Signed)
Addendum  created 05/24/17 1254 by Lyndle Herrlich, MD   Sign clinical note

## 2017-05-26 DIAGNOSIS — Z7689 Persons encountering health services in other specified circumstances: Secondary | ICD-10-CM | POA: Insufficient documentation

## 2017-05-26 DIAGNOSIS — Z0000 Encounter for general adult medical examination without abnormal findings: Secondary | ICD-10-CM | POA: Insufficient documentation

## 2017-05-26 NOTE — Assessment & Plan Note (Signed)
>>  ASSESSMENT AND PLAN FOR BENIGN ESSENTIAL HTN WRITTEN ON 05/26/2017  8:30 PM BY Beaulah Dinning, MD  Low normal BP of 110/52. No hypotensive symptoms. Medications include coreg 6.25 mg BID, Lasix 40 mg daily. Will leave medications as they are for now as patient has hx of CAD with CABG and CHF. Patient to follow up with cardiologist.

## 2017-05-26 NOTE — Assessment & Plan Note (Signed)
Low normal BP of 110/52. No hypotensive symptoms. Medications include coreg 6.25 mg BID, Lasix 40 mg daily. Will leave medications as they are for now as patient has hx of CAD with CABG and CHF. Patient to follow up with cardiologist.

## 2017-05-26 NOTE — Assessment & Plan Note (Signed)
Established care at our clinic today. All medical history updated in Epic. Patient overall doing well. Her and her son are going to fill out a living will, paperwork provided to them. Return to clinic in 3 months for diabetes management. She notes she is going to follow up with her cardiologist in the next month and has been following up with a neurologist and nephrologist. Saw GI earlier in the year after having a GI bleed, they stated she did not need follow up.

## 2017-05-27 ENCOUNTER — Telehealth: Payer: Self-pay | Admitting: Cardiovascular Disease

## 2017-05-27 MED ORDER — APIXABAN 5 MG PO TABS: 5.0000 mg | ORAL_TABLET | Freq: Two times a day (BID) | ORAL | 1 refills | Status: DC

## 2017-05-27 NOTE — Telephone Encounter (Signed)
[name] is calling because she has some questions about her Eliquis . Please call  Thanks

## 2017-05-27 NOTE — Telephone Encounter (Signed)
Pt wanting a coupon for eliquis  Is currently in [facility] and med will run 169.71 for 1 mo supply script and  coupon mailed to pt  At pt's request ./cy

## 2017-05-30 DIAGNOSIS — H43822 Vitreomacular adhesion, left eye: Secondary | ICD-10-CM | POA: Diagnosis not present

## 2017-05-30 DIAGNOSIS — H353112 Nonexudative age-related macular degeneration, right eye, intermediate dry stage: Secondary | ICD-10-CM | POA: Diagnosis not present

## 2017-05-30 DIAGNOSIS — H353122 Nonexudative age-related macular degeneration, left eye, intermediate dry stage: Secondary | ICD-10-CM | POA: Diagnosis not present

## 2017-05-30 DIAGNOSIS — H353222 Exudative age-related macular degeneration, left eye, with inactive choroidal neovascularization: Secondary | ICD-10-CM | POA: Diagnosis not present

## 2017-06-17 ENCOUNTER — Other Ambulatory Visit: Payer: Self-pay | Admitting: *Deleted

## 2017-06-17 DIAGNOSIS — I5022 Chronic systolic (congestive) heart failure: Secondary | ICD-10-CM

## 2017-06-17 MED ORDER — CARVEDILOL 6.25 MG PO TABS: 6.2500 mg | ORAL_TABLET | Freq: Two times a day (BID) | ORAL | 6 refills | Status: DC

## 2017-06-17 MED ORDER — CARVEDILOL 6.25 MG PO TABS
6.2500 mg | ORAL_TABLET | Freq: Two times a day (BID) | ORAL | 6 refills | Status: DC
Start: 2017-06-17 — End: 2017-10-23

## 2017-06-17 MED ORDER — POTASSIUM CHLORIDE CRYS ER 20 MEQ PO TBCR: 20.0000 meq | EXTENDED_RELEASE_TABLET | Freq: Every day | ORAL | 1 refills | Status: DC

## 2017-06-17 MED ORDER — FUROSEMIDE 40 MG PO TABS: 40.0000 mg | ORAL_TABLET | Freq: Every day | ORAL | 0 refills | Status: DC

## 2017-06-17 NOTE — Addendum Note (Signed)
Addended by: Carlyle Dolly on: 06/17/2017 06:34 PM   Modules accepted: Orders

## 2017-06-20 ENCOUNTER — Other Ambulatory Visit: Payer: Self-pay | Admitting: *Deleted

## 2017-06-20 MED ORDER — ROPINIROLE HCL 0.5 MG PO TABS: 1.0000 mg | ORAL_TABLET | Freq: Every day | ORAL | 1 refills | Status: DC

## 2017-06-24 ENCOUNTER — Telehealth: Payer: Self-pay | Admitting: *Deleted

## 2017-06-24 NOTE — Telephone Encounter (Signed)
Patient received 30 tabs of lasix from [facility]. She is requesting 90 day supply as this is a Psychologist, clinical. Requesting all meds to [facility] be 90 day supply. Hubbard Hartshorn, RN, BSN

## 2017-06-25 ENCOUNTER — Telehealth: Payer: Self-pay | Admitting: Family Medicine

## 2017-06-25 MED ORDER — FUROSEMIDE 40 MG PO TABS: 40.0000 mg | ORAL_TABLET | Freq: Every day | ORAL | 0 refills | Status: DC

## 2017-06-25 MED ORDER — FUROSEMIDE 40 MG PO TABS: 40.0000 mg | ORAL_TABLET | Freq: Two times a day (BID) | ORAL | 0 refills | Status: DC

## 2017-06-25 NOTE — Telephone Encounter (Signed)
Pt is calling because her lasix was prescribed at the wrong dose. She takes 2 tablets by mouth once a day and the bottle states that this is one tablet a day. She has been following that and now is retaining more fluid. Can we call this back in at 2 tablets daily and as a 90 day supply. jw

## 2017-06-25 NOTE — Telephone Encounter (Signed)
Rx sent 

## 2017-06-25 NOTE — Telephone Encounter (Signed)
Patient stated previously she was only taking once daily. She is a somewhat new patient to me and I have only seen her twice in clinic. Will fix her prescription. Additionally, if he she having some symptoms of having to much fluid she will need to make an appt to be seen to make sure everything is ok.   [facility] team please call her to schedule an appt to discuss her fluid and let her know I have fixed her medication. Thank you.

## 2017-06-26 NOTE — Telephone Encounter (Signed)
LVM for pt to call office back to inform her of below and assist her in scheduling an appointment if she is still having a problem with fluid. Katharina Caper, Kazuma Elena D, [place]

## 2017-06-27 ENCOUNTER — Telehealth: Payer: Self-pay | Admitting: *Deleted

## 2017-06-27 NOTE — Telephone Encounter (Addendum)
Patient returned call. First available appt scheduled with PCP for 07/18/2017. Patient states she takes 2 tabs lasix 40 mg (80 mg total) daily in AM. Requesting 90 day supply to [facility]. States she can get this med for free from [facility] if it's written for 90 days. Rx e-scribed on 8/14 went to [facility]. Hubbard Hartshorn, RN, BSN

## 2017-07-01 MED ORDER — FUROSEMIDE 40 MG PO TABS: 40.0000 mg | ORAL_TABLET | Freq: Two times a day (BID) | ORAL | 0 refills | Status: DC

## 2017-07-01 NOTE — Addendum Note (Signed)
Addended by: Carlyle Dolly on: 07/01/2017 09:21 AM   Modules accepted: Orders

## 2017-07-01 NOTE — Telephone Encounter (Signed)
Fixed rx for patient

## 2017-07-04 DIAGNOSIS — H353221 Exudative age-related macular degeneration, left eye, with active choroidal neovascularization: Secondary | ICD-10-CM | POA: Diagnosis not present

## 2017-07-04 DIAGNOSIS — H353112 Nonexudative age-related macular degeneration, right eye, intermediate dry stage: Secondary | ICD-10-CM | POA: Diagnosis not present

## 2017-07-04 DIAGNOSIS — H43822 Vitreomacular adhesion, left eye: Secondary | ICD-10-CM | POA: Diagnosis not present

## 2017-07-04 DIAGNOSIS — H353122 Nonexudative age-related macular degeneration, left eye, intermediate dry stage: Secondary | ICD-10-CM | POA: Diagnosis not present

## 2017-07-04 DIAGNOSIS — E113391 Type 2 diabetes mellitus with moderate nonproliferative diabetic retinopathy without macular edema, right eye: Secondary | ICD-10-CM | POA: Diagnosis not present

## 2017-07-17 NOTE — Progress Notes (Signed)
Subjective:    Patient ID: [name] , female   DOB: May 11, 1941 , 76 y.o..   MRN: [id number]  HPI  [name] is a 76 yo female with PMH of CAD with CABG 2009, ischemic cardiomyopathy, persistent atrial fibrillation, HFrEF 35%-40%, HLD, DM, CKD stage 3 here for  Chief Complaint  Patient presents with  . Follow Up Fluid Retention    1. LEG SWELLING  Having leg swelling for 14 days. Location: bilateral legs Timing of swelling: legs began to swell about 2.5 weeks ago. Has been taking 40 mg of Lasix BID- has been doing this for about 2.5 weeks, she thinks that this has been helping. Has never had CHF exacerbation.  New medications: None History of Kidney problems: Yes, has CKD stage 3 History of Heart problems: Yes, has CAD with CABG 2009, ischemic cardiomyopathy, persistent atrial fibrillation, HFrEF 35% -40%, HLD. Sees cardiologist Lauree Chandler, MD History of Liver problems: None History of cancer: No  Patient states that she drinks about 3 32 ounces containers of water a day.   Symptoms Chest pain: No  Shortness of breath: No  Immobility: Uses a walker  Black or bloody bowel movements: No  Severe snoring or day time sleepiness: No  Abdomen swelling: a little  History of blood clots: No  Weight Loss: No, weight gain   ROS see HPI Smoking Status noted  Past Medical History: Patient Active Problem List   Diagnosis Date Noted  . Bilateral leg edema 07/18/2017  . Encounter to establish care 05/26/2017  . Anemia 05/07/2017  . Abdominal mass, RLQ (right lower quadrant)   . History of CVA with residual deficit 03/26/2017  . Pulmonary hypertension ([place]) 03/26/2017  . Morbid obesity ([place]) 03/26/2017  . Rectal bleeding   . Persistent atrial fibrillation ([place])   . Chronic pain syndrome   . Coronary artery disease involving coronary bypass graft of native heart without angina pectoris   . Benign essential HTN   . DM type 2 with diabetic peripheral neuropathy  ([place])   . Thrombocytopenia ([place])   . Cerebrovascular accident (CVA) due to embolism of right middle cerebral artery ([place])   . CKD (chronic kidney disease) stage 3, GFR 30-59 ml/min 03/30/2016  . Chronic anticoagulation 03/30/2016  . Hyperlipidemia 01/31/2009  . ACC/AHA stage C congestive heart failure due to ischemic cardiomyopathy ([place]) 01/31/2009    Medications: reviewed and updated Current Outpatient Prescriptions  Medication Sig Dispense Refill  . apixaban (ELIQUIS) 5 MG TABS tablet Take 1 tablet (5 mg total) by mouth 2 (two) times daily. 60 tablet 1  . atorvastatin (LIPITOR) 40 MG tablet Take 1 tablet (40 mg total) by mouth daily at 6 PM. (Patient taking differently: Take 40 mg by mouth at bedtime. ) 30 tablet 0  . carvedilol (COREG) 6.25 MG tablet Take 1 tablet (6.25 mg total) by mouth 2 (two) times daily with a meal. 60 tablet 6  . cholecalciferol (VITAMIN D) 1000 UNITS tablet Take 1,000 Units by mouth 2 (two) times daily.    Marland Kitchen docusate sodium (COLACE) 100 MG capsule Take 200 mg by mouth at bedtime.     . furosemide (LASIX) 40 MG tablet Take 1 tablet (40 mg total) by mouth 2 (two) times daily. 90 tablet 0  . gabapentin (NEURONTIN) 400 MG capsule Take 1 capsule (400 mg total) by mouth at bedtime. (Patient taking differently: Take 400 mg by mouth 2 (two) times daily. ) 30 capsule 1  . insulin glargine (LANTUS)  100 UNIT/ML injection Inject 0.18 mLs (18 Units total) into the skin at bedtime. (Patient taking differently: Inject 23 Units into the skin at bedtime. ) 10 mL 11  . neomycin-bacitracin-polymyxin (NEOSPORIN) OINT Apply 1 application topically 3 (three) times daily as needed for wound care.    . nitroGLYCERIN (NITROSTAT) 0.4 MG SL tablet Place 0.4 mg under the tongue every 5 (five) minutes as needed for chest pain (up to 3 doses).     . potassium chloride SA (K-DUR,KLOR-CON) 20 MEQ tablet Take 1 tablet (20 mEq total) by mouth daily. 30 tablet 1  . rOPINIRole (REQUIP) 0.5 MG tablet  Take 2 tablets (1 mg total) by mouth at bedtime. 30 tablet 1  . silver sulfADIAZINE (SILVADENE) 1 % cream Apply 1 application topically 3 (three) times daily as needed (wound care).    . traMADol (ULTRAM) 50 MG tablet Take 1-2 tablets (50-100 mg total) by mouth See admin instructions. Take 2 tablets (100 mg) by mouth every morning and 1 tablet (50 mg) at night 30 tablet 2  . vitamin B-12 (CYANOCOBALAMIN) 1000 MCG tablet Take 1,000 mcg by mouth daily.     No current facility-administered medications for this visit.     Social Hx:  reports that she has never smoked. She has never used smokeless tobacco.   Objective:   BP 112/72   Pulse 79   Temp 98 F (36.7 C) (Oral)   Ht 5\' 10"  (1.778 m)   Wt (!) 323 lb 12.8 oz (146.9 kg)   SpO2 98%   BMI 46.46 kg/m  Physical Exam  Gen: NAD, alert, cooperative with exam, well-appearing HEENT: NCAT, PERRL, clear conjunctiva, oropharynx clear, supple neck Cardiac: Irregular rhythm, regular rate, normal E3/X5, 3/6 systolic murmur, capillary refill brisk, 2+ pitting edema from mid thigh to knee and 3+ pitting edema from knee down Respiratory: Clear to auscultation bilaterally, no wheezes, non-labored breathing Gastrointestinal: soft, non tender, distended, bowel sounds present Skin: hyperpigmented lower extremeties Psych: good insight, normal mood and affect  Assessment & Plan:  Bilateral leg edema Acute on chronic leg edema. Up 16 lbs from 2 months ago (patient unsure of baseline weight). Likely multifactorial with patient drinking 3 32 ounce containers of water a day, CHF, and CKD. No concerning symptoms for CHF exacerbation as patient's lungs sound clear, no dyspnea, or chest pain. The plan is as follows - Resume compression stockings - Limit water intake to no more than 1-2 Liters a day - Increase Lasix to 40 mg TID for the next 3 days and then follow up in clinic for weight/fluid status check - BMP today, may need to recheck at follow up visit as  well  - Provided patient with her cardiologist's number (Dr. Angelena Form) and advised she schedule follow up with then as soon as possible as she hasn't seen them in ~1 year - Red flag symptoms discussed - Will return to clinic in 4 days  Orders Placed This Encounter  Procedures  . Basic Metabolic Panel  . HgB A1c    Smitty Cords, MD [place], PGY-3

## 2017-07-18 ENCOUNTER — Telehealth: Payer: Self-pay | Admitting: Cardiovascular Disease

## 2017-07-18 ENCOUNTER — Encounter: Payer: Self-pay | Admitting: Family Medicine

## 2017-07-18 ENCOUNTER — Ambulatory Visit (INDEPENDENT_AMBULATORY_CARE_PROVIDER_SITE_OTHER): Payer: Medicare Other | Admitting: Family Medicine

## 2017-07-18 VITALS — BP 112/72 | HR 79 | Temp 98.0°F | Ht 70.0 in | Wt 323.8 lb

## 2017-07-18 DIAGNOSIS — E1142 Type 2 diabetes mellitus with diabetic polyneuropathy: Secondary | ICD-10-CM

## 2017-07-18 DIAGNOSIS — R600 Localized edema: Secondary | ICD-10-CM

## 2017-07-18 DIAGNOSIS — I509 Heart failure, unspecified: Secondary | ICD-10-CM

## 2017-07-18 DIAGNOSIS — Z23 Encounter for immunization: Secondary | ICD-10-CM | POA: Diagnosis not present

## 2017-07-18 DIAGNOSIS — I878 Other specified disorders of veins: Secondary | ICD-10-CM | POA: Insufficient documentation

## 2017-07-18 LAB — POCT GLYCOSYLATED HEMOGLOBIN (HGB A1C): Hemoglobin A1C: 6.7

## 2017-07-18 NOTE — Addendum Note (Signed)
Addended by: Katharina Caper, APRIL D on: 07/18/2017 05:26 PM   Modules accepted: Orders, SmartSet

## 2017-07-18 NOTE — Patient Instructions (Signed)
Thank you for coming in today, it was so nice to see you! Today we talked about:   - Please call Dr. Glennie Hawk to schedule an appointment  [address] [place], [place] [address]  [phone]   Fluid pill regimen: Take 40mg  Lasix tonight, then take 40 mg of Lasix three times a day Friday through Sunday. I would like for you to be seen on Monday.   We are going to check your blood to check your kidney function   We will give you your vaccines today  Cut back on the water to 1 of your containers of water.   Please follow up on Monday, Tuesday at the latest. You can schedule this appointment at the front desk before you leave or call the clinic.  Bring in all your medications or supplements to each appointment for review.   If we ordered any tests today, you will be notified via telephone of any abnormalities. If everything is normal you will get a letter in the mail.   If you have any questions or concerns, please do not hesitate to call the office at [phone]. You can also message me directly via MyChart.   Sincerely,  Smitty Cords, MD

## 2017-07-18 NOTE — Progress Notes (Addendum)
Cardiology Office Note    Date:  07/19/2017  ID:  [name], DOB 1940/11/16, MRN [id number] PCP:  Carlyle Dolly, MD  Cardiologist:  Angelena Form   Chief Complaint: leg swelling and weight gain  History of Present Illness:  [name] is a 76 y.o. female with history of CAD with CABG 2009, ischemic cardiomyopathy, chronic systolic CHF, persistent atrial fibrillation, stroke 03/2016 in setting of subtherapeutic INR, HLD, DM, CKD stage III, morbid obesity, pulmonary HTN, chronic pain, thrombocytopenia, anemia/GIB in 03/2017 who presents for evaluation of swelling. At time of bypass in 2009 her course was complicated by post-op atrial fib requiring temporary amiodarone and Coumadin. She went back into atrial fib in 2013. She initially deferred re-initiation of anticoagulation but later agreed. By the time she was therapeutic, she had been in atrial fib for about 1 year and expressed hesitance towards DCCV thus rate control strategy was pursued. In 03/2016 she was admitted with stroke in setting of recently subtherapeutic INR 1.6 at PCP office. She did develop concern for hemorrhagic transformation so Coumadin was discontinued. She was eventually transitioned to Eliquis. Last echo 03/2016 showed EF 35-40%, high ventricular filling pressure, mild MR, mild LAE/RAE, mod TR, PASP 37mmHg. In 03/2017 she was admitted with bloody bowel movements and ABL anemia to Hgb 7.5, GI was consulted and colonoscopy was performed with showed blood and clots in the entire colon. Some diverticulosis was noted in the sigmoid colon. There was no definite source of bleeding, but thought to be diverticular in nature. A tagged RBC was subsequently done to rule out active bleed. During bleeding scan there was an area of increased intake in the RUQ concerning for right renal pelvis bleed. CT abdomen and pelvis was ordered to further characterize possible right renal pelvis bleed. CT ruled out renal bleed but revealed a 3.7 cm  lesion in the RLQ concerning for GI malignancy or fluid collection. Patient underwent an IR guided biopsy without any complications. She was restarted on Eliquis at discharge. Pathology report states c/w cyst. She has prior h/o dizziness with benazepril and spironolactone and hypotension reaction with ARB. Last labs 04/2017 revealed Hgb 10.1, Plt 143.  She presented to her PCP's office yesterday reporting significant weight gain, total amount not totally clear. She was 296-298 in 03/2017, up to 322 yesterday. She has also noticed progressive significant LEE. She has chronic DOE, gradually worse over the last few months. No chest pain or palpitations. She has been eating a chicken pot pie as well as Oodles of Noodles regularly in addition to other frozen meals. BMET yesterday showed BUN 32/1.33, K 4.3 (baseline Cr appears 1.1-1.5). She was on Lasix 40mg  daily. PCP instructed her to take 1 additional tablet last night, then TID Fri/Sat/Sun and further dosing per cardiology. She is down 2lb since yesterday but different scale. Also reports drinking excess fluid intake. + Snoring. Denies any recurrent bleeding.   Past Medical History:  Diagnosis Date  . ACC/AHA stage C congestive heart failure due to ischemic cardiomyopathy ([place]) 01/31/2009   Qualifier: Diagnosis of  By: Olevia Perches, MD, Glenetta Hew   . ANXIETY 01/31/2009   Qualifier: Diagnosis of  By: Lovette Cliche, CNA, Christy    . Arthritis    "qwhere" (03/30/2016)  . Benign essential HTN   . CAD (coronary artery disease) 05/2008   a. s/p CABG in 2009.  Marland Kitchen Cerebrovascular accident (CVA) due to embolism of right middle cerebral artery ([place])   . Chronic anticoagulation   .  Chronic kidney disease (CKD), stage III (moderate)   . Chronic lower back pain   . Chronic pain syndrome   . Chronic systolic CHF (congestive heart failure) ([place])   . DM type 2 with diabetic peripheral neuropathy ([place])   . Facial weakness, post-stroke   . Gastrointestinal hemorrhage  03/26/2017  . Hemiparesis ([place])   . History of CVA with residual deficit 03/26/2017  . Hyperlipidemia   . Hypertension   . Ischemic cardiomyopathy   . Longstanding persistent atrial fibrillation ([place])    a. Postoperative in 2009;  S/P transesophageal echocardiography-guided cardioversion, previously on Amiodarone and Coumadin. b. recurrent in April 2013 -> pt elected rate control strategy, initially Coumadin -> had stroke with subtherapeutic INR in 03/2016 and transitioned to Eliquis.  . Morbid obesity ([place]) 03/26/2017  . Persistent atrial fibrillation ([place])   . Thrombocytopenia ([place])     Past Surgical History:  Procedure Laterality Date  . APPENDECTOMY  1946  . CATARACT EXTRACTION Right 2012  . COLONOSCOPY WITH PROPOFOL Left 03/27/2017   Procedure: COLONOSCOPY WITH PROPOFOL;  Surgeon: Ronnette Juniper, MD;  Location: [facility];  Service: Gastroenterology;  Laterality: Left;  . CORONARY ANGIOPLASTY WITH STENT PLACEMENT  2009   "put 2 stents in"  . HERNIA REPAIR    . JOINT REPLACEMENT    . TEE WITH CARDIOVERSION     Postoperative in 2009;  S/P transesophageal echocardiography-guided cardioversion,  . TOTAL KNEE ARTHROPLASTY Right 1994  . VENTRAL HERNIA REPAIR  10/1999   With mesh    Current Medications: Current Meds  Medication Sig  . apixaban (ELIQUIS) 5 MG TABS tablet Take 1 tablet (5 mg total) by mouth 2 (two) times daily.  Marland Kitchen atorvastatin (LIPITOR) 40 MG tablet Take 40 mg by mouth daily at 6 PM.  . carvedilol (COREG) 6.25 MG tablet Take 1 tablet (6.25 mg total) by mouth 2 (two) times daily with a meal.  . cholecalciferol (VITAMIN D) 1000 UNITS tablet Take 1,000 Units by mouth 2 (two) times daily.  Marland Kitchen docusate sodium (COLACE) 100 MG capsule Take 200 mg by mouth at bedtime.   . furosemide (LASIX) 40 MG tablet Take 1 tablet (40 mg total) by mouth 2 (two) times daily.  Marland Kitchen gabapentin (NEURONTIN) 400 MG capsule Take 400 mg by mouth 2 (two) times daily.  . insulin glargine (LANTUS) 100  UNIT/ML injection Inject 23 Units into the skin at bedtime.  . nitroGLYCERIN (NITROSTAT) 0.4 MG SL tablet Place 0.4 mg under the tongue every 5 (five) minutes as needed for chest pain (up to 3 doses).   . potassium chloride SA (K-DUR,KLOR-CON) 20 MEQ tablet Take 1 tablet (20 mEq total) by mouth daily.  Marland Kitchen rOPINIRole (REQUIP) 0.5 MG tablet Take 2 tablets (1 mg total) by mouth at bedtime.  . traMADol (ULTRAM) 50 MG tablet Take 1-2 tablets (50-100 mg total) by mouth See admin instructions. Take 2 tablets (100 mg) by mouth every morning and 1 tablet (50 mg) at night  . vitamin B-12 (CYANOCOBALAMIN) 1000 MCG tablet Take 1,000 mcg by mouth daily.     Allergies:   Benazepril; Angiotensin receptor blockers; Fe-succ-c-thre-b12-des stomach; Spironolactone; and Sulfamethoxazole-trimethoprim   Social History   Social History  . Marital status: Widowed    Spouse name: N/A  . Number of children: N/A  . Years of education: N/A   Social History Main Topics  . Smoking status: Never Smoker  . Smokeless tobacco: Never Used  . Alcohol use No  . Drug use: No  .  Sexual activity: Not Currently   Other Topics Concern  . None   Social History Narrative   Widowed   Lives alone in [place]   2 children   Not routinely exercising     Family History:  Family History  Problem Relation Age of Onset  . Clotting disorder Mother        Cerebral hemorrhage  . Emphysema Father        COD  . Lung cancer Brother   . Heart disease Neg Hx     ROS:   Please see the history of present illness. occ charley horse in BLE when ambulating. All other systems are reviewed and otherwise negative.    PHYSICAL EXAM:   VS:  BP 128/82   Pulse 63   Ht 5\' 9"  (1.753 m)   Wt (!) 320 lb (145.2 kg)   LMP  (LMP Unknown)   BMI 47.26 kg/m   BMI: Body mass index is 47.26 kg/m. GEN: Well nourished, well developed morbidly obese WF, pale, in no acute distress  HEENT: normocephalic, atraumatic Neck: no JVD, carotid  bruits, or masses Cardiac: RRR; no murmurs, rubs, or gallops, 1-2+ BLEE to below the knees (staging difficult to assess given morbid obesity) with mild petechial appearance likely due to stretching of skin, old hyperpigmentation of LLE which patient states is due to remote diabetic healed ulcer. No palpable cords. Respiratory:  clear to auscultation bilaterally, normal work of breathing GI: soft, nontender, nondistended, + BS MS: no deformity or atrophy  Skin: warm and dry, no rash Neuro:  Alert and Oriented x 3, Strength and sensation are intact, follows commands Psych: euthymic mood, full affect  Wt Readings from Last 3 Encounters:  07/19/17 (!) 320 lb (145.2 kg)  07/18/17 (!) 323 lb 12.8 oz (146.9 kg)  05/22/17 (!) 307 lb 3.2 oz (139.3 kg)      Studies/Labs Reviewed:   EKG:  EKG was ordered today and personally reviewed by me and demonstrates atrial fib 63bpm, generally lower voltage, no acute changes  Recent Labs: 03/25/2017: ALT 14; B Natriuretic Peptide 149.9 04/26/2017: Hemoglobin 10.1; Platelets 143 07/18/2017: BUN 32; Creatinine, Ser 1.33; Potassium 4.3; Sodium 142   Lipid Panel    Component Value Date/Time   CHOL 102 03/30/2016 0740   TRIG 50 03/30/2016 0740   HDL 39 (L) 03/30/2016 0740   CHOLHDL 2.6 03/30/2016 0740   VLDL 10 03/30/2016 0740   LDLCALC 53 03/30/2016 0740    Additional studies/ records that were reviewed today include: Summarized above    ASSESSMENT & PLAN:   1. Acute on chronic systolic CHF - likely due to accidental dietary noncompliance as patient states she was unaware of need for sodium/fluid restriction. She had prior issues with hypotension due to ARB and unknown reaction/possibly dizziness to benzapril. She also had dizziness with spironolactone thus med optimization of her HF has been difficult. Given her sedentary lifestyle and poor diet it is not clear where her new dry weight will fall, but it is quite clear she is carrying a significant  amount of fluid on board. I agree with Lasix TID over the weekend then will have her drop back to BID on Monday with f/u on Tuesday. Recent potassium was above 4 so will continue current dosing of this. Discussed 2g sodium/2L fluid restriction with patient. Plan early f/u next week. I told her I thought it made more sense for cardiology to manage this issue rather than keep going back and forth between here  and primary care. Since I am getting CBC today to f/u prior bleeding/anemia, will get BNP to trend. 2. CKD stage III - recommend provider obtain BMET at f/u OV. 3. Persistent atrial fibrillation, longstanding - asymptomatic, rate controlled. Given GIB in 03/2017, will recheck Hgb to trend. She has a pale complexion compared to her son but she herself does not seem concerned about this. 4. CAD - no ASA given concomitant Eliquis and h/o bleeding. Continue BB and statin. 5. Morbid obesity - given her weight, snoring, and CHF, will obtain sleep study to evaluate for OSA. Also discussed importance of overall longterm weight loss as well. 6. Lower extremity pain - she reports a longstanding history of charley-horse type discomfort in her legs when she walks. She does not have any current nonhealing ulcers. Pulses 1+ BLE. She is at risk for PAD given above comorbidities. With profound edema present, though, I'm not sure we would obtain the best images or pressures. Would diurese first then set up for LE arterial studies at a later date.  Disposition: Early f/u arranged on 07/23/17 with Richardson Dopp PA-C. PA availability was otherwise limited so unable to schedule with care team APP.   Medication Adjustments/Labs and Tests Ordered: Current medicines are reviewed at length with the patient today.  Concerns regarding medicines are outlined above. Medication changes, Labs and Tests ordered today are summarized above and listed in the Patient Instructions accessible in Encounters.   Signed, Charlie Pitter, PA-C    07/19/2017 11:15 AM    [facility] [place], [place], [place]  [address] Phone: [phone]; Fax: [phone]

## 2017-07-18 NOTE — Telephone Encounter (Signed)
New message    Pt is calling.   Pt c/o swelling: STAT is pt has developed SOB within 24 hours  1. How long have you been experiencing swelling? A week and a half  2. Where is the swelling located? Legs and feet  3.  Are you currently taking a "fluid pill"? Yes  4.  Are you currently SOB? Pt states it depends.   5.  Have you traveled recently? No   Pt states she saw her PCP today and they advised to see her cardiologist asap. She scheduled an appt with Melina Copa tomorrow at 74.

## 2017-07-18 NOTE — Assessment & Plan Note (Signed)
>>  ASSESSMENT AND PLAN FOR BILATERAL LEG EDEMA WRITTEN ON 07/18/2017 11:05 AM BY Beaulah Dinning, MD  Acute on chronic leg edema. Up 16 lbs from 2 months ago (patient unsure of baseline weight). Likely multifactorial with patient drinking 3 32 ounce containers of water a day, CHF, and CKD. No concerning symptoms for CHF exacerbation as patient's lungs sound clear, no dyspnea, or chest pain. The plan is as follows - Resume compression stockings - Limit water intake to no more than 1-2 Liters a day - Increase Lasix to 40 mg TID for the next 3 days and then follow up in clinic for weight/fluid status check - BMP today, may need to recheck at follow up visit as well  - Provided patient with her cardiologist's number (Dr. Clifton James) and advised she schedule follow up with then as soon as possible as she hasn't seen them in ~1 year - Red flag symptoms discussed - Will return to clinic in 4 days

## 2017-07-18 NOTE — Assessment & Plan Note (Addendum)
Acute on chronic leg edema. Up 16 lbs from 2 months ago (patient unsure of baseline weight). Likely multifactorial with patient drinking 3 32 ounce containers of water a day, CHF, and CKD. No concerning symptoms for CHF exacerbation as patient's lungs sound clear, no dyspnea, or chest pain. The plan is as follows - Resume compression stockings - Limit water intake to no more than 1-2 Liters a day - Increase Lasix to 40 mg TID for the next 3 days and then follow up in clinic for weight/fluid status check - BMP today, may need to recheck at follow up visit as well  - Provided patient with her cardiologist's number (Dr. Angelena Form) and advised she schedule follow up with then as soon as possible as she hasn't seen them in ~1 year - Red flag symptoms discussed - Will return to clinic in 4 days

## 2017-07-18 NOTE — Telephone Encounter (Signed)
I spoke with pt and she is planning on being here for appointment tomorrow. She reports she realized that her fluid problem may be related to eating frozen dinners.  She was not aware of sodium content in these. She will discuss diet restrictions at appointment tomorrow.

## 2017-07-19 ENCOUNTER — Telehealth: Payer: Self-pay | Admitting: *Deleted

## 2017-07-19 ENCOUNTER — Encounter: Payer: Self-pay | Admitting: Physician Assistant

## 2017-07-19 ENCOUNTER — Ambulatory Visit (INDEPENDENT_AMBULATORY_CARE_PROVIDER_SITE_OTHER): Payer: Medicare Other | Admitting: Physician Assistant

## 2017-07-19 VITALS — BP 128/82 | HR 63 | Ht 69.0 in | Wt 320.0 lb

## 2017-07-19 DIAGNOSIS — N183 Chronic kidney disease, stage 3 unspecified: Secondary | ICD-10-CM

## 2017-07-19 DIAGNOSIS — I481 Persistent atrial fibrillation: Secondary | ICD-10-CM | POA: Diagnosis not present

## 2017-07-19 DIAGNOSIS — I2510 Atherosclerotic heart disease of native coronary artery without angina pectoris: Secondary | ICD-10-CM

## 2017-07-19 DIAGNOSIS — I5023 Acute on chronic systolic (congestive) heart failure: Secondary | ICD-10-CM | POA: Diagnosis not present

## 2017-07-19 DIAGNOSIS — M79604 Pain in right leg: Secondary | ICD-10-CM | POA: Diagnosis not present

## 2017-07-19 DIAGNOSIS — M79605 Pain in left leg: Secondary | ICD-10-CM | POA: Diagnosis not present

## 2017-07-19 DIAGNOSIS — I4811 Longstanding persistent atrial fibrillation: Secondary | ICD-10-CM

## 2017-07-19 LAB — BASIC METABOLIC PANEL
BUN / CREAT RATIO: 24 (ref 12–28)
BUN/Creatinine Ratio: 19 (ref 12–28)
BUN: 32 mg/dL — ABNORMAL HIGH (ref 8–27)
BUN: 28 mg/dL — ABNORMAL HIGH (ref 8–27)
CALCIUM: 9.4 mg/dL (ref 8.7–10.3)
CHLORIDE: 102 mmol/L (ref 96–106)
CHLORIDE: 100 mmol/L (ref 96–106)
CO2: 24 mmol/L (ref 20–29)
CO2: 26 mmol/L (ref 20–29)
Calcium: 9.5 mg/dL (ref 8.7–10.3)
Creatinine, Ser: 1.33 mg/dL — ABNORMAL HIGH (ref 0.57–1.00)
Creatinine, Ser: 1.49 mg/dL — ABNORMAL HIGH (ref 0.57–1.00)
GFR calc non Af Amer: 38 mL/min/{1.73_m2} — ABNORMAL LOW (ref >59)
GFR calc non Af Amer: 33 mL/min/{1.73_m2} — ABNORMAL LOW (ref >59)
GFR, EST AFRICAN AMERICAN: 44 mL/min/{1.73_m2} — AB (ref >59)
GFR, EST AFRICAN AMERICAN: 39 mL/min/{1.73_m2} — AB (ref >59)
GLUCOSE: 102 mg/dL — AB (ref 65–99)
Glucose: 73 mg/dL (ref 65–99)
POTASSIUM: 3.8 mmol/L (ref 3.5–5.2)
Potassium: 4.3 mmol/L (ref 3.5–5.2)
SODIUM: 142 mmol/L (ref 134–144)
SODIUM: 141 mmol/L (ref 134–144)

## 2017-07-19 LAB — CBC
Hematocrit: 31.1 % — ABNORMAL LOW (ref 34.0–46.6)
Hemoglobin: 10.1 g/dL — ABNORMAL LOW (ref 11.1–15.9)
MCH: 28 pg (ref 26.6–33.0)
MCHC: 32.5 g/dL (ref 31.5–35.7)
MCV: 86 fL (ref 79–97)
PLATELETS: 113 10*3/uL — AB (ref 150–379)
RBC: 3.61 x10E6/uL — ABNORMAL LOW (ref 3.77–5.28)
RDW: 16.7 % — AB (ref 12.3–15.4)
WBC: 4.9 10*3/uL (ref 3.4–10.8)

## 2017-07-19 MED ORDER — FUROSEMIDE 40 MG PO TABS: ORAL_TABLET | ORAL | 3 refills | Status: DC

## 2017-07-19 NOTE — Telephone Encounter (Signed)
Received message on nurse line from patient requesting call from PCP regarding plan discussed at yesterday's OV. Hubbard Hartshorn, RN, BSN

## 2017-07-19 NOTE — Patient Instructions (Addendum)
Medication Instructions:  Your physician has recommended you make the following change in your medication:  1.  INCREASE the Lasix to 3 times a day today, tomorrow, and Sunday.  Starting Monday, you will go back to twice a day  Labwork: TODAY:  CBC & BMET  Testing/Procedures: Your physician has recommended that you have a sleep study. This test records several body functions during sleep, including: brain activity, eye movement, oxygen and carbon dioxide blood levels, heart rate and rhythm, breathing rate and rhythm, the flow of air through your mouth and nose, snoring, body muscle movements, and chest and belly movement.   Follow-Up: Your physician recommends that you schedule a follow-up appointment in: 07/23/17 ARRIVE AT 8:30 FOR AN APPT WITH SCOTT WEAVER, PA-C    Any Other Special Instructions Will Be Listed Below (If Applicable).  Sleep Studies A sleep study (polysomnogram) is a series of tests done while you are sleeping. It can show how well you sleep. This can help your health care provider diagnose a sleep disorder and show how severe your sleep disorder is. A sleep study may lead to treatment that will help you sleep better and prevent other medical problems caused by poor sleep. If you have a sleep disorder, you may also be at risk for:  Sleep-related accidents.  High blood pressure.  Heart disease.  Stroke.  Other medical conditions.  Sleep disorders are common. Your health care provider may suspect a sleep disorder if you:  Have loud snoring most nights.  Have brief periods when you stop breathing at night.  Feel sleepy on most days.  Fall asleep suddenly during the day.  Have trouble falling asleep or staying asleep.  Feel like you need to move your legs when trying to fall asleep.  Have dreams that seem very real shortly after falling asleep.  Feel like you cannot move when you first wake up.  Which tests will I need to have? Most sleep studies last  all night and include these tests:  Recordings of your brain activity.  Recordings of your eye movements.  Recording of your heart rate and rhythm.  Blood pressure readings.  Readings of the amount of oxygen in your blood.  Measurements of your chest and belly movement as you breathe during sleep.  If you have signs of the sleep disorder called sleep apnea during your test, you may get a mask to wear for the second half of the night.  The mask provides continuous positive airway pressure (CPAP). This may improve sleep apnea significantly.  You will then have all tests done again with the mask in place to see if your measurements and recordings change.  How are sleep studies done? Most sleep studies are done over one full night of sleep.  You will arrive at the study center in the evening and can go home in the morning.  Bring your pajamas and toothbrush.  Do not have caffeine on the day of your sleep study.  Your health care provider will let you know if you need to stop taking any of your regular medicines before the test.  To do the tests included in a polysomnogram, you will have:  Round, sticky patches with sensors attached to recording wires (electrodes) placed on your scalp, face, chest, and limbs.  Wires from all the electrodes and sensors run from your bed to a computer. The wires can be taken off and put back on if you need to get out of bed to go to  the bathroom.  A sensor placed over your nose to measure airflow.  A finger clip put on one finger to measure your blood oxygen level.  A belt around your belly and a belt around your chest to measure breathing movements.  Where are sleep studies done? Sleep studies are done at sleep centers. A sleep center may be inside a hospital, office, or clinic. The room where you have the study may look like a hospital room or a hotel room. The health care providers doing the study may come in and out of the room during the  study. Most of the time, they will be in another room monitoring your test. How is information from sleep studies helpful? A polysomnogram can be used along with your medical history and a physical exam to diagnose conditions, such as:  Sleep apnea.  Restless legs syndrome.  Sleep-related seizure disorders.  Sleep-related movement disorders.  A medical doctor who specializes in sleep will evaluate your sleep study. The specialist will share the results with your primary health care provider. Treatments based on your sleep study may include:  Improving your sleep habits (sleep hygiene).  Wearing a CPAP mask.  Wearing an oral device at night to improve breathing and reduce snoring.  Taking medicine for: ? Restless legs syndrome. ? Sleep-related seizure disorder. ? Sleep-related movement disorder.  This information is not intended to replace advice given to you by your health care provider. Make sure you discuss any questions you have with your health care provider. Document Released: 05/05/2003 Document Revised: 06/24/2016 Document Reviewed: 01/04/2014 Elsevier Interactive Patient Education  [employer].   If you need a refill on your cardiac medications before your next appointment, please call your pharmacy.

## 2017-07-22 ENCOUNTER — Other Ambulatory Visit: Payer: Self-pay | Admitting: *Deleted

## 2017-07-22 MED ORDER — TRAMADOL HCL 50 MG PO TABS: 50.0000 mg | ORAL_TABLET | ORAL | 2 refills | Status: DC

## 2017-07-22 NOTE — Telephone Encounter (Signed)
Called patient, she wanted a refill on tramadol. A prescription has been faxed to her pharmacy.   Smitty Cords, MD [place], PGY-3

## 2017-07-22 NOTE — Progress Notes (Signed)
Cardiology Office Note:    Date:  07/23/2017   ID:  [name], DOB Apr 03, 1941, MRN [id number]  PCP:  [name] Dolly, MD  Cardiologist:  Dr. Lauree [name]    Referring MD: [name] Dolly, MD   Chief Complaint  Patient presents with  . Congestive Heart Failure    Follow up    History of Present Illness:    [name] is a 76 y.o. female with a hx of CAD s/p CABG in 4270, systolic HF 2/2 ischemic CM, persistent AF, prior stroke in 5/17 (INR was subtherapeutic), HL, DM, CKD stage 3, morbid obesity, pulmonary HTN, chronic pain, thrombocytopenia, anemia, prior GI bleed in 03/2017.  She is now on Apixaban for anticoagulation.  She has prior h/o dizziness with benazepril and spironolactone and hypotension reaction with ARB.  She was last seen by [name] Copa, PA-C 07/19/17.  She was volume overloaded and her Furosemide was adjusted.    [name] returns for close follow-up. She is here with her son. She is feeling much better. Her breathing has improved. LE edema has improved. She has been managing her salt and fluid intake better. Her weight is down 12 pounds. She denies paroxysmal nocturnal dyspnea. She denies chest discomfort. She denies chest pain. She denies melena, hematochezia, hematuria.  Prior CV studies:   The following studies were reviewed today:  Echo 03/30/16 EF 35-40, diff HK, high ventricular filling pressure, trivial AI, MAC, mild MR, mild BAE, mod TR, PASP 49  Echo 2/11/4 EF 40-45, mod LAE, mild reduced RVSF, mod RAE  Cardiac Catheterization 05/2008 LAD mid 90, D2 95 OM occluded RCA mid 95, dist 30/70/90 EF 20 Referred for CABG  Past Medical History:  Diagnosis Date  . ACC/AHA stage C congestive heart failure due to ischemic cardiomyopathy ([place]) 01/31/2009   Qualifier: Diagnosis of  By: [name] Perches, MD, [name]   . ANXIETY 01/31/2009   Qualifier: Diagnosis of  By: [name] Cliche, CNA, [name]    . Arthritis    "qwhere" (03/30/2016)  . Benign  essential HTN   . CAD (coronary artery disease) 05/2008   a. s/p CABG in 2009.  Marland Kitchen Cerebrovascular accident (CVA) due to embolism of right middle cerebral artery ([place])   . Chronic anticoagulation   . Chronic kidney disease (CKD), stage III (moderate)   . Chronic lower back pain   . Chronic pain syndrome   . Chronic systolic CHF (congestive heart failure) ([place])   . DM type 2 with diabetic peripheral neuropathy ([place])   . Facial weakness, post-stroke   . Gastrointestinal hemorrhage 03/26/2017  . Hemiparesis ([place])   . History of CVA with residual deficit 03/26/2017  . Hyperlipidemia   . Hypertension   . Ischemic cardiomyopathy   . Longstanding persistent atrial fibrillation ([place])    a. Postoperative in 2009;  S/P transesophageal echocardiography-guided cardioversion, previously on Amiodarone and Coumadin. b. recurrent in April 2013 -> pt elected rate control strategy, initially Coumadin -> had stroke with subtherapeutic INR in 03/2016 and transitioned to Eliquis.  . Morbid obesity ([place]) 03/26/2017  . Persistent atrial fibrillation ([place])   . Thrombocytopenia ([place])     Past Surgical History:  Procedure Laterality Date  . APPENDECTOMY  1946  . CATARACT EXTRACTION Right 2012  . COLONOSCOPY WITH PROPOFOL Left 03/27/2017   Procedure: COLONOSCOPY WITH PROPOFOL;  Surgeon: [name] Juniper, MD;  Location: [facility];  Service: Gastroenterology;  Laterality: Left;  . CORONARY ANGIOPLASTY WITH STENT PLACEMENT  2009   "put  2 stents in"  . HERNIA REPAIR    . JOINT REPLACEMENT    . TEE WITH CARDIOVERSION     Postoperative in 2009;  S/P transesophageal echocardiography-guided cardioversion,  . TOTAL KNEE ARTHROPLASTY Right 1994  . VENTRAL HERNIA REPAIR  10/1999   With mesh    Current Medications: Current Meds  Medication Sig  . apixaban (ELIQUIS) 5 MG TABS tablet Take 1 tablet (5 mg total) by mouth 2 (two) times daily.  Marland Kitchen atorvastatin (LIPITOR) 40 MG tablet Take 40 mg by mouth daily at 6 PM.  .  carvedilol (COREG) 6.25 MG tablet Take 1 tablet (6.25 mg total) by mouth 2 (two) times daily with a meal.  . cholecalciferol (VITAMIN D) 1000 UNITS tablet Take 1,000 Units by mouth 2 (two) times daily.  Marland Kitchen docusate sodium (COLACE) 100 MG capsule Take 200 mg by mouth at bedtime.   . furosemide (LASIX) 40 MG tablet TAKE 1 TABLET BY MOUTH THREE TIMES A DAY FOR 3 DAYS THEN GO BACK TO 1 TABLET BY MOUTH TWICE A DAY  . gabapentin (NEURONTIN) 400 MG capsule Take 400 mg by mouth 2 (two) times daily.  . insulin glargine (LANTUS) 100 UNIT/ML injection Inject 23 Units into the skin at bedtime.  . nitroGLYCERIN (NITROSTAT) 0.4 MG SL tablet Place 0.4 mg under the tongue every 5 (five) minutes as needed for chest pain (up to 3 doses).   . potassium chloride SA (K-DUR,KLOR-CON) 20 MEQ tablet Take 1 tablet (20 mEq total) by mouth daily.  Marland Kitchen rOPINIRole (REQUIP) 0.5 MG tablet Take 2 tablets (1 mg total) by mouth at bedtime.  . traMADol (ULTRAM) 50 MG tablet Take 1-2 tablets (50-100 mg total) by mouth See admin instructions. Take 2 tablets (100 mg) by mouth every morning and 1 tablet (50 mg) at night  . vitamin B-12 (CYANOCOBALAMIN) 1000 MCG tablet Take 1,000 mcg by mouth daily.     Allergies:   Benazepril; Angiotensin receptor blockers; Fe-succ-c-thre-b12-des stomach; Spironolactone; and Sulfamethoxazole-trimethoprim   Social History   Social History  . Marital status: Widowed    Spouse name: N/A  . Number of children: N/A  . Years of education: N/A   Social History Main Topics  . Smoking status: Never Smoker  . Smokeless tobacco: Never Used  . Alcohol use No  . Drug use: No  . Sexual activity: Not Currently   Other Topics Concern  . None   Social History Narrative   Widowed   Lives alone in [place]   2 children   Not routinely exercising     Family Hx: The patient's family history includes Clotting disorder in her mother; Emphysema in her father; Lung cancer in her brother. There is no history  of Heart disease.  ROS:   Please see the history of present illness.    ROS All other systems reviewed and are negative.   EKGs/Labs/Other Test Reviewed:    EKG:  EKG is  ordered today.  The ekg ordered today demonstrates Atrial fibrillation, HR 67  Recent Labs: 03/25/2017: ALT 14; B Natriuretic Peptide 149.9 07/19/2017: BUN 28; Creatinine, Ser 1.49; Hemoglobin 10.1; Platelets 113; Potassium 3.8; Sodium 141   Recent Lipid Panel Lab Results  Component Value Date/Time   CHOL 102 03/30/2016 07:40 AM   TRIG 50 03/30/2016 07:40 AM   HDL 39 (L) 03/30/2016 07:40 AM   CHOLHDL 2.6 03/30/2016 07:40 AM   LDLCALC 53 03/30/2016 07:40 AM    Physical Exam:    VS:  BP 126/78  Pulse 68   Ht _0  (1.753 m)   Wt (!) 308 lb 6.4 oz (139.9 kg)   LMP  (LMP Unknown)   BMI 45.54 kg/m     Wt Readings from Last 3 Encounters:  07/23/17 (!) 308 lb 6.4 oz (139.9 kg)  07/19/17 (!) 320 lb (145.2 kg)  07/18/17 (!) 323 lb 12.8 oz (146.9 kg)     Physical Exam  Constitutional: She is oriented to person, place, and time. She appears well-developed and well-nourished. No distress.  HENT:  Head: Normocephalic.  Eyes: No scleral icterus.  Neck: JVD (at 90) present.  Cardiovascular: Normal rate.  An irregularly irregular rhythm present.  Murmur heard.  Holosystolic murmur is present with a grade of 3/6  at the lower left sternal border Pulmonary/Chest: She has no wheezes. She has no rales.  Abdominal: Soft.  Musculoskeletal: She exhibits edema (1+ bilateral LE edema).  Neurological: She is alert and oriented to person, place, and time.  Skin: Skin is warm and dry.  Psychiatric: She has a normal mood and affect.    ASSESSMENT:    1. Acute on chronic systolic (congestive) heart failure ([place])   2. Coronary artery disease involving coronary bypass graft of native heart without angina pectoris   3. Persistent atrial fibrillation ([place])   4. Pulmonary hypertension ([place])   5. CKD (chronic kidney  disease) stage 3, GFR 30-59 ml/min    PLAN:    In order of problems listed above:  1. Acute on chronic systolic (congestive) heart failure (HCC) EF approximately 40% by echocardiogram 2017. She has been intolerant of ACE inhibitor/ARB as well as spironolactone. He remains on beta blocker therapy. Volume is improved. She still appears to have more fluid to lose. I have recommended she continue on Lasix 60 mg twice a day for the next 4 days then go back to 40 mg twice a day. If her weight drops below 300 pounds prior to that time, she can reduce her dose to 40 mg twice a day earlier. BMET today. Repeat BMET 1 week.  2. Coronary artery disease involving coronary bypass graft of native heart without angina pectoris Status post CABG in 2009. She denies anginal symptoms. Continue statin, beta blocker.  3. Persistent atrial fibrillation (HCC) Rate controlled. She remains on Apixaban. Recent CBC with stable hemoglobin.  4. Pulmonary hypertension ([place]) Likely related to a combination of heart failure, age and obesity.  5. CKD (chronic kidney disease) stage 3, GFR 30-59 ml/min Recent creatinine stable. Repeat BMET today as well as next week to keep a close eye on renal function and potassium.   Dispo:  Return in about 1 month (around 08/22/2017) for Routine Follow Up w/ Dr. Angelena Form or [name] Copa, PA-C .   Medication Adjustments/Labs and Tests Ordered: Current medicines are reviewed at length with the patient today.  Concerns regarding medicines are outlined above.  Tests Ordered: Orders Placed This Encounter  Procedures  . Basic Metabolic Panel (BMET)  . Basic Metabolic Panel (BMET)  . EKG 12-Lead   Medication Changes: No orders of the defined types were placed in this encounter.   Signed, Richardson Dopp, PA-C  07/23/2017 9:18 AM    [facility] [address], [place], [place]  [address] Phone: [phone]; Fax: [phone]

## 2017-07-23 ENCOUNTER — Encounter: Payer: Self-pay | Admitting: *Deleted

## 2017-07-23 ENCOUNTER — Encounter (INDEPENDENT_AMBULATORY_CARE_PROVIDER_SITE_OTHER): Payer: Self-pay

## 2017-07-23 ENCOUNTER — Telehealth: Payer: Self-pay | Admitting: *Deleted

## 2017-07-23 ENCOUNTER — Encounter: Payer: Self-pay | Admitting: Physician Assistant

## 2017-07-23 ENCOUNTER — Ambulatory Visit (INDEPENDENT_AMBULATORY_CARE_PROVIDER_SITE_OTHER): Payer: Medicare Other | Admitting: Physician Assistant

## 2017-07-23 ENCOUNTER — Ambulatory Visit: Payer: Medicare Other | Admitting: Family Medicine

## 2017-07-23 VITALS — BP 126/78 | HR 68 | Ht 69.0 in | Wt 308.4 lb

## 2017-07-23 DIAGNOSIS — I5023 Acute on chronic systolic (congestive) heart failure: Secondary | ICD-10-CM

## 2017-07-23 DIAGNOSIS — G4733 Obstructive sleep apnea (adult) (pediatric): Secondary | ICD-10-CM

## 2017-07-23 DIAGNOSIS — N183 Chronic kidney disease, stage 3 unspecified: Secondary | ICD-10-CM

## 2017-07-23 DIAGNOSIS — I272 Pulmonary hypertension, unspecified: Secondary | ICD-10-CM | POA: Diagnosis not present

## 2017-07-23 DIAGNOSIS — I481 Persistent atrial fibrillation: Secondary | ICD-10-CM | POA: Diagnosis not present

## 2017-07-23 DIAGNOSIS — I25810 Atherosclerosis of coronary artery bypass graft(s) without angina pectoris: Secondary | ICD-10-CM | POA: Diagnosis not present

## 2017-07-23 DIAGNOSIS — I4819 Other persistent atrial fibrillation: Secondary | ICD-10-CM

## 2017-07-23 LAB — BASIC METABOLIC PANEL
BUN/Creatinine Ratio: 20 (ref 12–28)
BUN: 29 mg/dL — ABNORMAL HIGH (ref 8–27)
CHLORIDE: 99 mmol/L (ref 96–106)
CO2: 28 mmol/L (ref 20–29)
Calcium: 9.6 mg/dL (ref 8.7–10.3)
Creatinine, Ser: 1.42 mg/dL — ABNORMAL HIGH (ref 0.57–1.00)
GFR calc non Af Amer: 35 mL/min/{1.73_m2} — ABNORMAL LOW (ref >59)
GFR, EST AFRICAN AMERICAN: 41 mL/min/{1.73_m2} — AB (ref >59)
GLUCOSE: 139 mg/dL — AB (ref 65–99)
POTASSIUM: 3.7 mmol/L (ref 3.5–5.2)
SODIUM: 144 mmol/L (ref 134–144)

## 2017-07-23 MED ORDER — FUROSEMIDE 40 MG PO TABS: 40.0000 mg | ORAL_TABLET | ORAL | 11 refills | Status: DC

## 2017-07-23 NOTE — Telephone Encounter (Signed)
-----   Message from Jeanann Lewandowsky, [place] sent at 07/19/2017  4:36 PM EDT ----- PT NEEDS SLEEP STUDY

## 2017-07-23 NOTE — Telephone Encounter (Signed)
Informed patient of upcoming sleep study and patient understanding was verbalized. Patient understands her sleep study is scheduled for Saturday August 17 2017. Patient informed me she would like to have a Home Sleep Study. Message has been sent to [facility] to ok home sleep study.

## 2017-07-23 NOTE — Telephone Encounter (Signed)
-----   Message from Liliane Shi, [place] sent at 07/23/2017  4:45 PM EDT ----- Please call the patient Kidney function is stable. Continue current medications as discussed at [facility] today. Repeat BMET 1 week. Richardson Dopp, PA-C    07/23/2017 4:45 PM

## 2017-07-23 NOTE — Patient Instructions (Addendum)
Medication Instructions:  1. INCREASE LASIX TO 60 MG TWICE DAILY FOR 4 DAYS THRU Friday 07/26/17  2. ON Saturday 07/27/17 YOU WILL GO BACK TO LASIX 40 MG TWICE DAILY  3. MONITOR YOUR WEIGHT DAILY AND IF YOUR WEIGHT FALLS BELOW 300 BEFORE Friday 07/26/17 YOU WILL THEN GO BACK TO LASIX 40 MG TWICE DAILY STARTING THAT DAY3  Labwork: 1. TOADY BMET  2. IN 1 WEEK BMET   Testing/Procedures: NONE ORDERED TODAY  Follow-Up: IN 1 MONTH DR. Angelena Form OR HIS CARETEAM   Any Other Special Instructions Will Be Listed Below (If Applicable).     If you need a refill on your cardiac medications before your next appointment, please call your pharmacy.

## 2017-07-23 NOTE — Telephone Encounter (Signed)
RE: Home Sleep Study   [name]  Freada Bergeron, CMA        Yes

## 2017-07-23 NOTE — Addendum Note (Signed)
Addended by: Michae Kava on: 07/23/2017 09:34 AM   Modules accepted: Orders

## 2017-07-23 NOTE — Telephone Encounter (Signed)
Pt has been notified of lab results by phone with verbal understanding. Pt thanked me for my call today.   

## 2017-07-26 NOTE — Telephone Encounter (Signed)
Informed patient of upcoming home sleep study and patient understanding was verbalized. Patient understands her sleep study will be done at [facility]. Patient understands she will receive a CALLin a week or so from [place]. Patient understands to call if she does not receive that call in a timely manner. Patient agrees with treatment and thanked me for call.

## 2017-07-30 ENCOUNTER — Other Ambulatory Visit: Payer: Medicare Other

## 2017-07-30 DIAGNOSIS — I5023 Acute on chronic systolic (congestive) heart failure: Secondary | ICD-10-CM | POA: Diagnosis not present

## 2017-07-30 DIAGNOSIS — N183 Chronic kidney disease, stage 3 unspecified: Secondary | ICD-10-CM

## 2017-07-30 LAB — BASIC METABOLIC PANEL
BUN / CREAT RATIO: 23 (ref 12–28)
BUN: 32 mg/dL — AB (ref 8–27)
CO2: 28 mmol/L (ref 20–29)
CREATININE: 1.41 mg/dL — AB (ref 0.57–1.00)
Calcium: 9.2 mg/dL (ref 8.7–10.3)
Chloride: 100 mmol/L (ref 96–106)
GFR, EST AFRICAN AMERICAN: 41 mL/min/{1.73_m2} — AB (ref >59)
GFR, EST NON AFRICAN AMERICAN: 36 mL/min/{1.73_m2} — AB (ref >59)
Glucose: 86 mg/dL (ref 65–99)
Potassium: 4.1 mmol/L (ref 3.5–5.2)
Sodium: 143 mmol/L (ref 134–144)

## 2017-07-31 ENCOUNTER — Telehealth: Payer: Self-pay | Admitting: Physician Assistant

## 2017-07-31 NOTE — Telephone Encounter (Signed)
Discussed lab results done 07/30/17 with patient.

## 2017-07-31 NOTE — Telephone Encounter (Signed)
F/u Message  Pt returning RN call. Please call back to discuss labs.

## 2017-08-05 ENCOUNTER — Other Ambulatory Visit: Payer: Self-pay | Admitting: *Deleted

## 2017-08-05 ENCOUNTER — Encounter: Payer: Self-pay | Admitting: Cardiology

## 2017-08-05 DIAGNOSIS — G4733 Obstructive sleep apnea (adult) (pediatric): Secondary | ICD-10-CM | POA: Diagnosis not present

## 2017-08-05 MED ORDER — POTASSIUM CHLORIDE CRYS ER 20 MEQ PO TBCR: 20.0000 meq | EXTENDED_RELEASE_TABLET | Freq: Every day | ORAL | 1 refills | Status: DC

## 2017-08-05 MED ORDER — ROPINIROLE HCL 0.5 MG PO TABS: 1.0000 mg | ORAL_TABLET | Freq: Every day | ORAL | 1 refills | Status: DC

## 2017-08-08 ENCOUNTER — Other Ambulatory Visit: Payer: Self-pay | Admitting: Cardiovascular Disease

## 2017-08-08 DIAGNOSIS — H353112 Nonexudative age-related macular degeneration, right eye, intermediate dry stage: Secondary | ICD-10-CM | POA: Diagnosis not present

## 2017-08-08 DIAGNOSIS — H353221 Exudative age-related macular degeneration, left eye, with active choroidal neovascularization: Secondary | ICD-10-CM | POA: Diagnosis not present

## 2017-08-08 DIAGNOSIS — H353122 Nonexudative age-related macular degeneration, left eye, intermediate dry stage: Secondary | ICD-10-CM | POA: Diagnosis not present

## 2017-08-08 DIAGNOSIS — H43822 Vitreomacular adhesion, left eye: Secondary | ICD-10-CM | POA: Diagnosis not present

## 2017-08-08 DIAGNOSIS — N183 Chronic kidney disease, stage 3 (moderate): Secondary | ICD-10-CM | POA: Diagnosis not present

## 2017-08-08 MED ORDER — FUROSEMIDE 40 MG PO TABS: 40.0000 mg | ORAL_TABLET | Freq: Two times a day (BID) | ORAL | 3 refills | Status: DC

## 2017-08-08 NOTE — Telephone Encounter (Signed)
Per Dr Angelena Form, pt is suppose to take furosemide 40 mg tablet BID, on 07/27/17. Pt's medication was resent to a mail order pharmacy. Confirmation received.

## 2017-08-13 NOTE — Telephone Encounter (Signed)
[facility] fax came today ,Per Dr Radford Pax patient has severe OSA with an AHI of 34/hr. Set up for in lab CPAP Titration.  CPAP Titration ordered.

## 2017-08-13 NOTE — Addendum Note (Signed)
Addended by: Freada Bergeron on: 08/13/2017 05:36 PM   Modules accepted: Orders

## 2017-08-14 ENCOUNTER — Encounter: Payer: Self-pay | Admitting: *Deleted

## 2017-08-14 DIAGNOSIS — I129 Hypertensive chronic kidney disease with stage 1 through stage 4 chronic kidney disease, or unspecified chronic kidney disease: Secondary | ICD-10-CM | POA: Diagnosis not present

## 2017-08-14 DIAGNOSIS — E1122 Type 2 diabetes mellitus with diabetic chronic kidney disease: Secondary | ICD-10-CM | POA: Diagnosis not present

## 2017-08-14 DIAGNOSIS — N183 Chronic kidney disease, stage 3 (moderate): Secondary | ICD-10-CM | POA: Diagnosis not present

## 2017-08-14 DIAGNOSIS — Z6841 Body Mass Index (BMI) 40.0 and over, adult: Secondary | ICD-10-CM

## 2017-08-14 NOTE — Telephone Encounter (Signed)
Patient's CPAP Titration is scheduled for Monday October 07 2017.

## 2017-08-16 ENCOUNTER — Telehealth: Payer: Self-pay | Admitting: *Deleted

## 2017-08-16 NOTE — Telephone Encounter (Signed)
Informed patient of sleep study results and patient understanding was verbalized. Patient understands she has severe sleep apnea. Patient understands Dr Radford Pax has recommended a CPAP Titration to treat her sleep apnea. Patient ask to have a mask that does not cover her mouth and nose because she is claustrophobic. Patient ask if she can do this titration at home. Patient agrees with treatment and thanked me for the call.

## 2017-08-16 NOTE — Telephone Encounter (Signed)
-----   Message from Sueanne Margarita, MD sent at 08/16/2017  3:32 PM EDT ----- Patient has severe OSA - please set up for in lab CPAP titration

## 2017-08-17 ENCOUNTER — Encounter (HOSPITAL_BASED_OUTPATIENT_CLINIC_OR_DEPARTMENT_OTHER): Payer: Medicare Other

## 2017-08-19 ENCOUNTER — Other Ambulatory Visit: Payer: Self-pay

## 2017-08-19 ENCOUNTER — Telehealth: Payer: Self-pay | Admitting: Cardiovascular Disease

## 2017-08-19 ENCOUNTER — Telehealth: Payer: Self-pay

## 2017-08-19 DIAGNOSIS — I255 Ischemic cardiomyopathy: Secondary | ICD-10-CM

## 2017-08-19 DIAGNOSIS — I509 Heart failure, unspecified: Principal | ICD-10-CM

## 2017-08-19 MED ORDER — APIXABAN 5 MG PO TABS: 5.0000 mg | ORAL_TABLET | Freq: Two times a day (BID) | ORAL | 3 refills | Status: DC

## 2017-08-19 NOTE — Telephone Encounter (Signed)
**Note De-Identified  Obfuscation** The pt is advised that I will leave her [place] pt assistance application for Eliquis with Radene Journey nurse, and that she can pick it up at her office visit with Richardson Dopp, PA-c on Friday 10/12. She verbalized understanding and thanked me for my help.

## 2017-08-19 NOTE — Telephone Encounter (Signed)
Pt c/o swelling: STAT is pt has developed SOB within 24 hours  1) How much weight have you gained and in what time span? 1lb a day  2) If swelling, where is the swelling located? Feet and ankles  3) Are you currently taking a fluid pill? Yes  40mg   4) Are you currently SOB? NO  5) Do you have a log of your daily weights (if so, list)? NO  6) Have you gained 3 pounds in a day or 5 pounds in a week? YES  7) Have you traveled recently? NO

## 2017-08-19 NOTE — Telephone Encounter (Signed)
-----   Message from Katrine Coho, RN sent at 08/19/2017  3:55 PM EDT ----- Regarding: help with cost of Eliquis She is in the donut hole and would like to see about getting help with cost of Eliquis.   Thanks. Webb Silversmith

## 2017-08-19 NOTE — Telephone Encounter (Signed)
Pt states weight has been increasing since last Thursday.  Pt states weight mid week last week  around 300 lbs, today 308 lbs, pt states increase in shortness of breath with exertion , okay at rest, increase in  bilateral  LE edema, abdominal distention.

## 2017-08-19 NOTE — Telephone Encounter (Signed)
Take Lasix 80 mg Twice daily x 1 day. Then reduce Lasix to 60 mg Twice daily  Arrange follow up this week. Get BMET at follow up visit this week. Richardson Dopp, PA-C    08/19/2017 3:36 PM

## 2017-08-19 NOTE — Telephone Encounter (Signed)
Discussed with patient, patient verbalized understanding, agreed with plan, appt scheduled for this Friday, lab and follow-up.

## 2017-08-21 NOTE — Telephone Encounter (Signed)
Patient understands she will be contacted by [place]. She understands to call if [facility] does not contact her with new setup in a timely manner. She understands she will be called once confirmation has been received from [facility] that she has received her new machine to schedule 10 week follow up appointment.  [facility] notified of CPAP Titration Please add to airview She was grateful for the call and thanked me

## 2017-08-23 ENCOUNTER — Telehealth: Payer: Self-pay | Admitting: Physician Assistant

## 2017-08-23 ENCOUNTER — Ambulatory Visit: Payer: Medicare Other | Admitting: Physician Assistant

## 2017-08-23 ENCOUNTER — Other Ambulatory Visit: Payer: Medicare Other

## 2017-08-23 DIAGNOSIS — N183 Chronic kidney disease, stage 3 unspecified: Secondary | ICD-10-CM
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
## 2017-08-23 NOTE — Telephone Encounter (Signed)
Follow Up:      Returning your call from today. 

## 2017-08-23 NOTE — Telephone Encounter (Signed)
Lmtcb to go over recommendation from [place], [place] to continue on Lasix 60 mg BID. Per PA pt will need to come in either [place] or Tues next week for BMET. See note from earlier today.

## 2017-08-23 NOTE — Telephone Encounter (Signed)
New message    Patient concerned about swelling. Wants to know if medication should be changed. Please call Pt c/o medication issue:  1. Name of Medication: furosemide (LASIX) 40 MG tablet  2. How are you currently taking this medication (dosage and times per day)? Patient  States she is taking 60mg  in the am and 60mg  pm  3. Are you having a reaction (difficulty breathing--STAT)? no  4. What is your medication issue? Wants to know if she should increase   Pt c/o swelling: STAT is pt has developed SOB within 24 hours  1) How much weight have you gained and in what time span? 305 this morning  2) If swelling, where is the swelling located? Ankles and legs  3) Are you currently taking a fluid pill? furosemide (LASIX) 40 MG tablet  4) Are you currently SOB? no  5) Do you have a log of your daily weights (if so, list)? 308 on 10/11  6) Have you gained 3 pounds in a day or 5 pounds in a week? no  7) Have you traveled recently? no

## 2017-08-23 NOTE — Telephone Encounter (Signed)
Patient called in earlier this week with increased weight, edema, shortness of breath. Lasix was increased to 60 mg Twice daily.  Continue current dose of Lasix. She needs a follow up BMET.  See if she can come in Monday or Tuesday for repeat BMET. Richardson Dopp, PA-C    08/23/2017 2:31 PM

## 2017-08-23 NOTE — Telephone Encounter (Signed)
Pt aware to continue on lasix 60 mg BID. Pt agreeable to bmet 08/27/17. Pt thanked me for my call.

## 2017-08-26 ENCOUNTER — Ambulatory Visit: Payer: Medicare Other | Admitting: Physician Assistant

## 2017-08-27 ENCOUNTER — Telehealth: Payer: Self-pay

## 2017-08-27 ENCOUNTER — Encounter (INDEPENDENT_AMBULATORY_CARE_PROVIDER_SITE_OTHER): Payer: Self-pay

## 2017-08-27 ENCOUNTER — Other Ambulatory Visit: Payer: Medicare Other | Admitting: *Deleted

## 2017-08-27 ENCOUNTER — Telehealth: Payer: Self-pay | Admitting: *Deleted

## 2017-08-27 DIAGNOSIS — R600 Localized edema: Secondary | ICD-10-CM

## 2017-08-27 DIAGNOSIS — N183 Chronic kidney disease, stage 3 unspecified: Secondary | ICD-10-CM

## 2017-08-27 LAB — BASIC METABOLIC PANEL
BUN/Creatinine Ratio: 19 (ref 12–28)
BUN: 31 mg/dL — AB (ref 8–27)
CO2: 27 mmol/L (ref 20–29)
CREATININE: 1.59 mg/dL — AB (ref 0.57–1.00)
Calcium: 9.4 mg/dL (ref 8.7–10.3)
Chloride: 100 mmol/L (ref 96–106)
GFR calc Af Amer: 36 mL/min/{1.73_m2} — ABNORMAL LOW (ref >59)
GFR, EST NON AFRICAN AMERICAN: 31 mL/min/{1.73_m2} — AB (ref >59)
GLUCOSE: 76 mg/dL (ref 65–99)
Potassium: 4.1 mmol/L (ref 3.5–5.2)
Sodium: 142 mmol/L (ref 134–144)

## 2017-08-27 MED ORDER — FUROSEMIDE 40 MG PO TABS: 60.0000 mg | ORAL_TABLET | ORAL | Status: DC

## 2017-08-27 MED ORDER — TRAMADOL HCL 50 MG PO TABS: 50.0000 mg | ORAL_TABLET | ORAL | 0 refills | Status: DC

## 2017-08-27 NOTE — Telephone Encounter (Signed)
-----   Message from Liliane Shi, [place] sent at 08/27/2017  3:58 PM EDT ----- Please call the patient Creatinine increased some. Decrease Lasix to 60 mg in A and 40 mg in P BMET 1 week.  Take extra Lasix 40 x1 if weight increases more than 3 lbs in 1 day. Richardson Dopp, PA-C    08/27/2017 3:58 PM

## 2017-08-27 NOTE — Telephone Encounter (Signed)
Patient has an appointment on the 09/09/2017 @ 0830 with [facility], but, she needs a prescription called in for her tramadol. She is on her last one.[name]

## 2017-08-27 NOTE — Telephone Encounter (Signed)
Pt has been notified of lab results and findings by phone with verbal understanding. Pt advised to decrease lasix to 60 mg AM/40 mg PM due to elevated kidney function. Pt advised to monitor weight. If weight is up 3 lb's x 1 day she may take an extra lasix 40 mg that day. BMET 09/03/17. Pt is agreeable to plan of care. Pt thanked me for my call.

## 2017-08-27 NOTE — Telephone Encounter (Signed)
Pt informed. Gwendlyn Hanback T Charlet Harr, CMA  

## 2017-08-28 ENCOUNTER — Encounter: Payer: Self-pay | Admitting: Physician Assistant

## 2017-08-28 DIAGNOSIS — N390 Urinary tract infection, site not specified: Secondary | ICD-10-CM | POA: Diagnosis not present

## 2017-08-28 NOTE — Progress Notes (Signed)
Subjective:   Patient ID: [name]    DOB: September 07, 1941, 77 y.o. female   MRN: [id number]  [name] is a 76 y.o. female with a history of HTN, obesity, diabetes, CKD 3, chronic leg edema here for   Leg Blister - Patient has noticed blister on outside of R lower leg for the past 2 days. Patient states was initially the size of a dime. Lately has noticed yellow discharge. - Denies fever, N/V, night sweats, weight loss, pain, any other blisters. - Per Dr. Camillia Herter last telephone encounter - patient should take Lasix 60mg  in am, 40mg  in pm 10/16. Monitor weight - if weight increases 3lbs in one day, should take extra lasix 40mg . - Per patient, she previously took 80 mg bid X 4 days and lost 7 lbs. Then took 60 mg bid and regained 5.4 lbs. She states she was supposed to return to 40 mg BID once weight below 300 lbs but weight never went below 300.  - Patient is currently taking 60mg  BID, taking an extra 40mg  if gains >3lbs.  - Son reports poor compliance to compression stockings - Patient states she tried to keep feet elevated when not active  Healthcare Maintenance - Vaccines: tetanus - DEXA Scan: due 2005 - order placed - due for foot and eye exam  Review of Systems:  Per HPI.   [place]: reviewed. Smoking status reviewed. Medications reviewed.  Objective:   BP 122/60   Pulse 76   Temp 98.1 F (36.7 C) (Oral)   Ht 5\' 9"  (1.753 m)   Wt (!) 310 lb 12.8 oz (141 kg)   LMP  (LMP Unknown)   SpO2 99%   BMI 45.90 kg/m  Vitals and nursing note reviewed.  General: well nourished, well developed, in no acute distress with non-toxic appearance CV: regular rate and rhythm without murmurs, rubs, or gallops Lungs: clear to auscultation bilaterally with normal work of breathing Skin: warm, dry, no rashes or lesions Extremities: warm, bilateral lower extremity 3+ pitting edema with chronic skin changes to lower legs. Purulent blister to outside of RLL, ~4mm in diameter (see picture  below). No tunneling appreciated.  Neuro: Alert and oriented, speech normal      Assessment & Plan:   Open wound of right knee, leg, and ankle with complication Purulent blister x2 days to RLL. Most likely blister due to chronic lower extremity edema that became infected due to environmental exposure or irritation. Because she is diabetic, concerned about poor wound healing. Dressing applied today. - Cipro 500mg  BID x7 days - F/u in one week for wound check - Continue current lasix dosage and f/u with Dr. Angelena Form. - Counseling given: elevate legs when not active, wear compression stockings, keep active.  Orders Placed This Encounter  Procedures  . DG Bone Density    Standing Status:   Future    Standing Expiration Date:   10/29/2018    Order Specific Question:   Reason for Exam (SYMPTOM  OR DIAGNOSIS REQUIRED)    Answer:   screening for osteoporosis    Order Specific Question:   Preferred imaging location?    Answer:   [facility]   Meds ordered this encounter  Medications  . ciprofloxacin (CIPRO) 500 MG tablet    Sig: Take 1 tablet (500 mg total) by mouth 2 (two) times daily.    Dispense:  14 tablet    Refill:  0    Rory Percy, DO PGY-1, [facility] 08/29/2017  3:58 PM

## 2017-08-29 ENCOUNTER — Encounter: Payer: Self-pay | Admitting: Family Medicine

## 2017-08-29 ENCOUNTER — Telehealth: Payer: Self-pay | Admitting: Physician Assistant

## 2017-08-29 ENCOUNTER — Telehealth: Payer: Self-pay | Admitting: *Deleted

## 2017-08-29 ENCOUNTER — Ambulatory Visit (INDEPENDENT_AMBULATORY_CARE_PROVIDER_SITE_OTHER): Payer: Medicare Other | Admitting: Family Medicine

## 2017-08-29 VITALS — BP 122/60 | HR 76 | Temp 98.1°F | Ht 69.0 in | Wt 310.8 lb

## 2017-08-29 DIAGNOSIS — Z13820 Encounter for screening for osteoporosis: Secondary | ICD-10-CM | POA: Diagnosis not present

## 2017-08-29 DIAGNOSIS — S81001A Unspecified open wound, right knee, initial encounter: Secondary | ICD-10-CM

## 2017-08-29 DIAGNOSIS — S81801A Unspecified open wound, right lower leg, initial encounter: Secondary | ICD-10-CM | POA: Insufficient documentation

## 2017-08-29 DIAGNOSIS — S91001A Unspecified open wound, right ankle, initial encounter: Secondary | ICD-10-CM

## 2017-08-29 MED ORDER — CIPROFLOXACIN HCL 500 MG PO TABS: 500.0000 mg | ORAL_TABLET | Freq: Two times a day (BID) | ORAL | 0 refills | Status: AC

## 2017-08-29 NOTE — Telephone Encounter (Signed)
Saw patient today for infected leg blister - see today's encounter.  Rory Percy, DO PGY-1, [facility] 08/29/2017 4:10 PM

## 2017-08-29 NOTE — Telephone Encounter (Signed)
New message    [name] from [id number] ext [phone] calling to report swelling and weight gain.  [name] to fax weight log. Patient scheduled to see PCP today.   Pt c/o swelling: STAT is pt has developed SOB within 24 hours  1) How much weight have you gained and in what time span?  5.6lbs in 3 days  2) If swelling, where is the swelling located? Feet and ankles  3) Are you currently taking a fluid pill? yes  4) Are you currently SOB? no  5) Do you have a log of your daily weights (if so, list)? Log to be faxed   6) Have you gained 3 pounds in a day or 5 pounds in a week? 5.6lbs in 3 days  7) Have you traveled recently? no

## 2017-08-29 NOTE — Telephone Encounter (Signed)
Received message on nurse line from Sonia Baller, RN Case Manager with [facility] [phone] X [phone]) that patient's lasix has been adjusted by Dr. Angelena Form. Patient took 80 mg bid X 4 days and lost 7 lbs. Then took 60 mg bid and regained 5.4 lbs. Was supposed to return to 40 mg once weight below 300 lbs but weight never went below 300. Patient continues to report swelling in both feet and ankles. Dr. Julianne Handler is aware. Patient being seen today at [facility] for blister on right leg. Hubbard Hartshorn, RN, BSN

## 2017-08-29 NOTE — Telephone Encounter (Signed)
Patient aware of instructions given two days ago about her lasix and weight gain. Per Richardson Dopp PA, take an extra Lasix 40 mg once if weight increases more than 3 lbs in one day. Patient stated she has only gained 2 lbs in the last day. Patient stated she has an appointment with her PCP today, and she will give our office a call if she has any questions or concerns. Patient verbalized understanding.

## 2017-08-29 NOTE — Patient Instructions (Addendum)
It was great to see you!  For your leg blister,  - You most likely got an infected blister from extensive leg swelling. - I am prescribing an antibiotic to take twice daily. - Keep area covered and resist scratching to keep from making infection worse. - Follow up in one week for wound recheck.  You are due for a yearly eye exam, please schedule this appointment with your eye doctor.  You are also due for a bone density scan. I have ordered this scan, please call to make an appointment.  We are checking some labs today, we will call you or send you a letter if they are abnormal.   Take care and seek immediate care sooner if you develop any concerns.   Rory Percy, DO [facility]   Edema Edema is an abnormal buildup of fluids in your bodytissues. Edema is somewhatdependent on gravity to pull the fluid to the lowest place in your body. That makes the condition more common in the legs and thighs (lower extremities). Painless swelling of the feet and ankles is common and becomes more likely as you get older. It is also common in looser tissues, like around your eyes. When the affected area is squeezed, the fluid may move out of that spot and leave a dent for a few moments. This dent is called pitting. What are the causes? There are many possible causes of edema. Eating too much salt and being on your feet or sitting for a long time can cause edema in your legs and ankles. Hot weather may make edema worse. Common medical causes of edema include:  Heart failure.  Liver disease.  Kidney disease.  Weak blood vessels in your legs.  Cancer.  An injury.  Pregnancy.  Some medications.  Obesity.  What are the signs or symptoms? Edema is usually painless.Your skin may look swollen or shiny. How is this diagnosed? Your health care provider may be able to diagnose edema by asking about your medical history and doing a physical exam. You may need to have tests such as  X-rays, an electrocardiogram, or blood tests to check for medical conditions that may cause edema. How is this treated? Edema treatment depends on the cause. If you have heart, liver, or kidney disease, you need the treatment appropriate for these conditions. General treatment may include:  Elevation of the affected body part above the level of your heart.  Compression of the affected body part. Pressure from elastic bandages or support stockings squeezes the tissues and forces fluid back into the blood vessels. This keeps fluid from entering the tissues.  Restriction of fluid and salt intake.  Use of a water pill (diuretic). These medications are appropriate only for some types of edema. They pull fluid out of your body and make you urinate more often. This gets rid of fluid and reduces swelling, but diuretics can have side effects. Only use diuretics as directed by your health care provider.  Follow these instructions at home:  Keep the affected body part above the level of your heart when you are lying down.  Do not sit still or stand for prolonged periods.  Do not put anything directly under your knees when lying down.  Do not wear constricting clothing or garters on your upper legs.  Exercise your legs to work the fluid back into your blood vessels. This may help the swelling go down.  Wear elastic bandages or support stockings to reduce ankle swelling as directed by  your health care provider.  Eat a low-salt diet to reduce fluid if your health care provider recommends it.  Only take medicines as directed by your health care provider. Contact a health care provider if:  Your edema is not responding to treatment.  You have heart, liver, or kidney disease and notice symptoms of edema.  You have edema in your legs that does not improve after elevating them.  You have sudden and unexplained weight gain. Get help right away if:  You develop shortness of breath or chest  pain.  You cannot breathe when you lie down.  You develop pain, redness, or warmth in the swollen areas.  You have heart, liver, or kidney disease and suddenly get edema.  You have a fever and your symptoms suddenly get worse. This information is not intended to replace advice given to you by your health care provider. Make sure you discuss any questions you have with your health care provider. Document Released: 10/29/2005 Document Revised: 04/05/2016 Document Reviewed: 08/21/2013 Elsevier Interactive Patient Education  2017 [employer].

## 2017-08-29 NOTE — Assessment & Plan Note (Addendum)
Purulent blister x2 days to RLL. Most likely blister due to chronic lower extremity edema that became infected due to environmental exposure or irritation. Because she is diabetic, concerned about poor wound healing. Dressing applied today. - Cipro 500mg  BID x7 days - F/u in one week for wound check - Continue current lasix dosage and f/u with Dr. Angelena Form. - Counseling given: elevate legs when not active, wear compression stockings, keep active.

## 2017-08-30 NOTE — Telephone Encounter (Signed)
Paperwork from [facility] received in office.  Pt saw primary care yesterday.  She is seeing Richardson Dopp, PA on 09/10/17.  Dr. Angelena Form has availability on his schedule for 10/22.  I called pt and asked her if she would like sooner appointment. She does not feel she needs  appointment at this time and will plan on seeing Richardson Dopp, PA on 10/30

## 2017-09-02 ENCOUNTER — Telehealth: Payer: Self-pay | Admitting: Cardiovascular Disease

## 2017-09-02 NOTE — Telephone Encounter (Signed)
I spoke with pt. Weight over weekend was 304-306 lbs.  Has been taking Lasix 60 mg twice daily.   Weight today was 300.4 lbs.  Per last office note she was to decrease to 40 mg twice daily when weight fell below 300 lbs.  Note on last lab results indicate pt was to decrease lasix to 60 mg in the AM and 40 mg in the PM.  I told pt to take this dose and then decrease to 40 mg twice daily when weight went below 300 lbs.  Pt will come in tomorrow for BMP.

## 2017-09-02 NOTE — Telephone Encounter (Signed)
New message   Patient wants to inform nurse that her weight is better. Wants to know if she can decrease medication dosage. Please call.   Pt c/o medication issue:  1. Name of Medication: furosemide (LASIX) 60  2. How are you currently taking this medication (dosage and times per day)? 60mg   Twice day  3. Are you having a reaction (difficulty breathing--STAT)? no  4. What is your medication issue? Patient wants to know if she can start taking 40mg  instead of 60mg 

## 2017-09-03 ENCOUNTER — Telehealth: Payer: Self-pay | Admitting: *Deleted

## 2017-09-03 ENCOUNTER — Other Ambulatory Visit: Payer: Medicare Other

## 2017-09-03 ENCOUNTER — Other Ambulatory Visit: Payer: Self-pay | Admitting: Family Medicine

## 2017-09-03 DIAGNOSIS — E2839 Other primary ovarian failure: Secondary | ICD-10-CM

## 2017-09-03 DIAGNOSIS — R600 Localized edema: Secondary | ICD-10-CM

## 2017-09-03 DIAGNOSIS — G4733 Obstructive sleep apnea (adult) (pediatric): Secondary | ICD-10-CM | POA: Diagnosis not present

## 2017-09-03 LAB — BASIC METABOLIC PANEL
BUN / CREAT RATIO: 18 (ref 12–28)
BUN: 28 mg/dL — AB (ref 8–27)
CALCIUM: 9.3 mg/dL (ref 8.7–10.3)
CO2: 27 mmol/L (ref 20–29)
CREATININE: 1.58 mg/dL — AB (ref 0.57–1.00)
Chloride: 98 mmol/L (ref 96–106)
GFR calc Af Amer: 36 mL/min/{1.73_m2} — ABNORMAL LOW (ref >59)
GFR calc non Af Amer: 31 mL/min/{1.73_m2} — ABNORMAL LOW (ref >59)
GLUCOSE: 122 mg/dL — AB (ref 65–99)
Potassium: 3.9 mmol/L (ref 3.5–5.2)
Sodium: 140 mmol/L (ref 134–144)

## 2017-09-03 MED ORDER — FUROSEMIDE 40 MG PO TABS
40.0000 mg | ORAL_TABLET | Freq: Two times a day (BID) | ORAL | 3 refills | Status: DC
Start: 2017-09-03 — End: 2017-09-10

## 2017-09-03 NOTE — Telephone Encounter (Signed)
Order changed with estrogen deficiency as Dx.  Thanks! Rory Percy, DO PGY-1, [facility] 09/03/2017 12:06 PM

## 2017-09-03 NOTE — Telephone Encounter (Signed)
-----   Message from Liliane Shi, [place] sent at 09/03/2017  4:57 PM EDT ----- Please call the patient Renal function fairly stable.  Continue current medications and follow up as planned.  Richardson Dopp, PA-C 09/03/2017 4:56 PM

## 2017-09-03 NOTE — Telephone Encounter (Signed)
Pt has been notified of lab results by phone with verbal understanding. Pt wanted to confirm what dose of lasix she should be doing now. See phone note 09/02/17 from Zenaida Deed, RN for Dr. Angelena Form. Pt states to me that her weight today is 300 lb's even, so she lost the .4 oz from yesterday. Pt advised to change lasix to 40 BID since she has reached baseline weight set by Richardson Dopp, PA. Pt aware to continue to monitor weight and call if weight is up 3 lb's in 1 day or 5 lb's in 1 week. Pt has appt with Richardson Dopp, PAC on 09/10/17. Pt thanked me for my call today.

## 2017-09-03 NOTE — Telephone Encounter (Signed)
Judson Roch called to let us know screening for osteoporosis is not an acceptable dx for bone density.  In order for insurance to cover it, the Dx of osteopenia or  Estrogen deficiency must be used.  Please change if acceptable and Judson Roch will call patient to schedule Ewing Fandino, [name], [place]

## 2017-09-05 ENCOUNTER — Encounter: Payer: Self-pay | Admitting: Family Medicine

## 2017-09-05 ENCOUNTER — Ambulatory Visit (INDEPENDENT_AMBULATORY_CARE_PROVIDER_SITE_OTHER): Payer: Medicare Other | Admitting: Family Medicine

## 2017-09-05 VITALS — BP 90/58 | HR 76 | Temp 97.9°F | Ht 69.0 in | Wt 306.6 lb

## 2017-09-05 DIAGNOSIS — T148XXA Other injury of unspecified body region, initial encounter: Secondary | ICD-10-CM | POA: Diagnosis not present

## 2017-09-05 NOTE — Progress Notes (Signed)
    Subjective:  [name] is a 76 y.o. female who presents to the [facility] today for follow up on a blister on her leg.   HPI:  Leg blister: Does have systolic heart failure, CKD stage 3 and chronic lower extremity edema. Last week developed a blister on the lateral aspect of her left leg. It has been weaping yellow clear fluid.  Seen last week and prescribed ciprofloxacin 500mg  x7 days. No fevers, chills, pain, worsening swelling, redness, warmth or drainage.   PMH: see above Tobacco use: no Medication: reviewed and updated ROS: see HPI   Objective:  Physical Exam: BP (!) 90/58   Pulse 76   Temp 97.9 F (36.6 C) (Oral)   Ht 5\' 9"  (1.753 m)   Wt (!) 306 lb 9.6 oz (139.1 kg)   LMP  (LMP Unknown)   SpO2 98%   BMI 45.28 kg/m   Gen: 76 yo F in NAD, resting comfortably CV: RRR with no murmurs appreciated Pulm: NWOB, CTAB with no crackles, wheezes, or rhonchi GI: Normal bowel sounds present. Soft, Nontender, Nondistended. Skin: warm, dry, 2.2.5cm blister without surrounding erythema, tenderness, or drainage. Does have minimal amount of clear fluid weaping from the inferior aspect of the lesion.  Ext: 2+ pitting edema  Results for orders placed or performed in visit on 09/03/17 (from the past 72 hour(s))  Basic Metabolic Panel (BMET)     Status: Abnormal   Collection Time: 09/03/17  8:02 AM  Result Value Ref Range   Glucose 122 (H) 65 - 99 mg/dL   BUN 28 (H) 8 - 27 mg/dL   Creatinine, Ser 1.58 (H) 0.57 - 1.00 mg/dL   GFR calc non Af Amer 31 (L) >59 mL/min/1.73   GFR calc Af Amer 36 (L) >59 mL/min/1.73   BUN/Creatinine Ratio 18 12 - 28   Sodium 140 134 - 144 mmol/L   Potassium 3.9 3.5 - 5.2 mmol/L   Chloride 98 96 - 106 mmol/L   CO2 27 20 - 29 mmol/L   Calcium 9.3 8.7 - 10.3 mg/dL     Assessment/Plan:  Leg blister Does have 1+ pitting edema, but this is most likely 2/2 lymphedema. No erythema, warmth, drainage, fluctuance, pain, fevers or chills. Appears to be healing  compared to previous photo. Finishing last dose of cipro today. Will continue to weep. Recommending continuing with compression stockings, elevating legs. Return precautions discussed.   Artesia Berkey L. Rosalyn Gess, [place] [facility] Resident PGY-2 09/05/2017 3:23 PM

## 2017-09-05 NOTE — Patient Instructions (Addendum)
[name] you were seen today for a follow up for your blister. It looks much better and does not look infected.  Take your last dose of your antibiotic.  This will continue to weep. That is ok. This should heal on it's own. Continue wearing your compression stockings and elevate your legs.   Please follow up with your primary doctor for any chronic medical problems.   Very nice to see you today, [name] L. Rosalyn Gess, [place] [facility] Resident PGY-2 09/05/2017 3:22 PM

## 2017-09-09 ENCOUNTER — Ambulatory Visit (INDEPENDENT_AMBULATORY_CARE_PROVIDER_SITE_OTHER): Payer: Medicare Other | Admitting: Family Medicine

## 2017-09-09 DIAGNOSIS — S81801D Unspecified open wound, right lower leg, subsequent encounter: Secondary | ICD-10-CM

## 2017-09-09 DIAGNOSIS — S81001D Unspecified open wound, right knee, subsequent encounter: Secondary | ICD-10-CM | POA: Diagnosis not present

## 2017-09-09 DIAGNOSIS — R600 Localized edema: Secondary | ICD-10-CM | POA: Diagnosis not present

## 2017-09-09 DIAGNOSIS — S91001D Unspecified open wound, right ankle, subsequent encounter: Secondary | ICD-10-CM | POA: Diagnosis not present

## 2017-09-09 DIAGNOSIS — I10 Essential (primary) hypertension: Secondary | ICD-10-CM

## 2017-09-09 MED ORDER — TETANUS-DIPHTH-ACELL PERTUSSIS 5-2.5-18.5 LF-MCG/0.5 IM SUSP: 0.5000 mL | Freq: Once | INTRAMUSCULAR | 0 refills | Status: AC

## 2017-09-09 NOTE — Assessment & Plan Note (Signed)
Normotensive today. At goal. -Continue Coreg 6.25 mg twice a day and Lasix 40 mg twice a day -Following up with cardiology tomorrow

## 2017-09-09 NOTE — Patient Instructions (Addendum)
Thank you for coming in today, it was so nice to see you! Today we talked about:    Blood pressure: Your blood pressure is good today. Continue taking the same medications   Your weight is up slightly. I know you have an appt with Nicki Reaper tomorrow, I will hold off on any adjustments to your Lasix regimen  If you become more short of breath or have chest pain, please go to the emergency department  We have given you a prescription for a tetanus vaccine. Please take this to the pharmacy to get this done  Please follow up with me in 3 months and follow up with our nurse Lauren within the next couple months for a annual wellness visit. You can schedule this appointment at the front desk before you leave or call the clinic.  Bring in all your medications or supplements to each appointment for review.   If you have any questions or concerns, please do not hesitate to call the office at [phone]. You can also message me directly via MyChart.   Sincerely,  Smitty Cords, MD

## 2017-09-09 NOTE — Assessment & Plan Note (Signed)
>>  ASSESSMENT AND PLAN FOR BENIGN ESSENTIAL HTN WRITTEN ON 09/09/2017 10:33 AM BY Beaulah Dinning, MD  Normotensive today. At goal. -Continue Coreg 6.25 mg twice a day and Lasix 40 mg twice a day -Following up with cardiology tomorrow

## 2017-09-09 NOTE — Assessment & Plan Note (Addendum)
Blister on right lower leg appears to be healing slowly. No signs of infection today. She has completed a seven-day course of Cipro, last dose was on 10/24 -Continue to monitor and return precautions discussed

## 2017-09-09 NOTE — Progress Notes (Signed)
Subjective:    Patient ID: [name] , female   DOB: 03-11-1941 , 76 y.o..   MRN: [id number]  HPI  [name] is a 76 yo F with PMH of CAD with CABG 2009, ischemic cardiomyopathy, persistent atrial fibrillation, HFrEF 35%-40%, HLD, DM, CKD stage 3 here for  Chief Complaint  Patient presents with  . Edema    1. Hypertension Blood pressure at home: does not check  Exercise: Limited by body habitus  Low salt diet: Patient states she does not eat extra salt  Medications: Compliant with Lasix 40 mg twice a day, carvedilol 6.25 mg twice a day Side effects: None noted  ROS: Denies headache, dizziness, visual changes, nausea, vomiting, chest pain, abdominal pain or shortness of breath. Patient has been following closely with her cardiology office. Her Lasix has been titrated according to her weight and fluid status.  Has been doing no more than 2 L of fluid a day (mostly water and tea)  BP Readings from Last 3 Encounters:  09/09/17 122/60  09/05/17 (!) 90/58  08/29/17 122/60   2. Elevated Weight: Notes that her weight has been fluctuating. Patient notes that her weight was 305 on October 25 and today it is 311. She notes that she has not been drinking more than 2 L of fluid a day (mostly water and tea). She did have some diet pepsi this weekend. States that she has been trying to cut back on salt.  3. Blister on right leg: patient states that is not painful and thinks that it is looking a little bit better although it is hard for her to completely visualize secondary to body habitus. Denies any purulent drainage. Denies any fevers, chills, nausea, vomiting.  Review of Systems: Per HPI.   Past Medical History: Patient Active Problem List   Diagnosis Date Noted  . Open wound of right knee, leg, and ankle with complication 73/41/9379  . Bilateral leg edema 07/18/2017  . Anemia 05/07/2017  . Abdominal mass, RLQ (right lower quadrant)   . History of CVA with residual deficit  03/26/2017  . Pulmonary hypertension ([place]) 03/26/2017  . Morbid obesity ([place]) 03/26/2017  . Rectal bleeding   . Persistent atrial fibrillation ([place])   . Chronic pain syndrome   . Coronary artery disease involving coronary bypass graft of native heart without angina pectoris   . Benign essential HTN   . DM type 2 with diabetic peripheral neuropathy ([place])   . Thrombocytopenia ([place])   . Cerebrovascular accident (CVA) due to embolism of right middle cerebral artery ([place])   . CKD (chronic kidney disease) stage 3, GFR 30-59 ml/min (HCC) 03/30/2016  . Chronic anticoagulation 03/30/2016  . Hyperlipidemia 01/31/2009  . ACC/AHA stage C congestive heart failure due to ischemic cardiomyopathy ([place]) 01/31/2009    Medications: reviewed and updated Current Outpatient Prescriptions  Medication Sig Dispense Refill  . apixaban (ELIQUIS) 5 MG TABS tablet Take 1 tablet (5 mg total) by mouth 2 (two) times daily. 180 tablet 3  . atorvastatin (LIPITOR) 40 MG tablet Take 40 mg by mouth daily at 6 PM.    . carvedilol (COREG) 6.25 MG tablet Take 1 tablet (6.25 mg total) by mouth 2 (two) times daily with a meal. 60 tablet 6  . cholecalciferol (VITAMIN D) 1000 UNITS tablet Take 1,000 Units by mouth 2 (two) times daily.    Marland Kitchen docusate sodium (COLACE) 100 MG capsule Take 200 mg by mouth at bedtime.     . furosemide (  LASIX) 40 MG tablet Take 1 tablet (40 mg total) by mouth 2 (two) times daily. 180 tablet 3  . gabapentin (NEURONTIN) 400 MG capsule Take 400 mg by mouth 2 (two) times daily.    . insulin glargine (LANTUS) 100 UNIT/ML injection Inject 23 Units into the skin at bedtime.    . nitroGLYCERIN (NITROSTAT) 0.4 MG SL tablet Place 0.4 mg under the tongue every 5 (five) minutes as needed for chest pain (up to 3 doses).     . potassium chloride SA (K-DUR,KLOR-CON) 20 MEQ tablet Take 1 tablet (20 mEq total) by mouth daily. 30 tablet 1  . rOPINIRole (REQUIP) 0.5 MG tablet Take 2 tablets (1 mg total) by mouth at  bedtime. 30 tablet 1  . Tdap (BOOSTRIX) 5-2.5-18.5 LF-MCG/0.5 injection Inject 0.5 mLs into the muscle once. 0.5 mL 0  . traMADol (ULTRAM) 50 MG tablet Take 1-2 tablets (50-100 mg total) by mouth See admin instructions. Take 2 tablets (100 mg) by mouth every morning and 1 tablet (50 mg) at night 40 tablet 0  . vitamin B-12 (CYANOCOBALAMIN) 1000 MCG tablet Take 1,000 mcg by mouth daily.     No current facility-administered medications for this visit.     Social Hx:  reports that she has never smoked. She has never used smokeless tobacco.   Objective:   BP 122/60 (BP Location: Left Wrist, Patient Position: Sitting, Cuff Size: Normal)   Pulse 66   Temp 97.8 F (36.6 C) (Oral)   Wt (!) 311 lb (141.1 kg)   LMP  (LMP Unknown)   SpO2 99%   BMI 45.93 kg/m  Physical Exam  Gen: NAD, alert, cooperative with exam, well-appearing, obese Cardiac: Irregular rhythm, normal rate, faint heart sounds, systolic murmur present, 2+ pitting edema in ankles, 1+ pitting edema mid shin, capillary refill brisk  Respiratory: Clear to auscultation bilaterally, no wheezes, non-labored breathing Skin: 2-3cm ulcer on right leg without erythema or fluctuance. Is weeping slightly on inferior aspect, a scab is forming on the medial aspect.  Neurological: Uses walker to ambulate Psych: good insight, normal mood and affect  Assessment & Plan:  Benign essential HTN Normotensive today. At goal. -Continue Coreg 6.25 mg twice a day and Lasix 40 mg twice a day -Following up with cardiology tomorrow  Bilateral leg edema Continues to have edematous lower extremities with HFrEF 35%-40%. Patient does not know what her dry weight is. Per Epic review it seems that the goal dry weight is around 300 pounds. 11 days ago she was 310, 4 days ago she was 305 and today she is 311. Reassuringly lungs are clear to auscultation and patient denies any shortness of breath or chest pain. Her last BMP does show increasing  creatinine. -Continue to encourage elevation of legs and compression stockings -Continue Lasix 40 mg twice a day for now, she has follow-up with cardiology tomorrow. She is also going to get a BMP tomorrow morning. will defer any change in diuretics to cardiology team. Appreciate their assistance.  -Discussed red flag symptoms and reasons to go to the ED  Open wound of right knee, leg, and ankle with complication Blister on right lower leg appears to be healing slowly. No signs of infection today. She has completed a seven-day course of Cipro, last dose was on 10/24 -Continue to monitor and return precautions discussed   Meds ordered this encounter  Medications  . Tdap (BOOSTRIX) 5-2.5-18.5 LF-MCG/0.5 injection    Sig: Inject 0.5 mLs into the muscle once.  Dispense:  0.5 mL    Refill:  0    Smitty Cords, MD [facility], PGY-3

## 2017-09-09 NOTE — Assessment & Plan Note (Addendum)
Continues to have edematous lower extremities with HFrEF 35%-40%. Patient does not know what her dry weight is. Per Epic review it seems that the goal dry weight is around 300 pounds. 11 days ago she was 310, 4 days ago she was 305 and today she is 311. Reassuringly lungs are clear to auscultation and patient denies any shortness of breath or chest pain. Her last BMP does show increasing creatinine. -Continue to encourage elevation of legs and compression stockings -Continue Lasix 40 mg twice a day for now, she has follow-up with cardiology tomorrow. She is also going to get a BMP tomorrow morning. will defer any change in diuretics to cardiology team. Appreciate their assistance.  -Discussed red flag symptoms and reasons to go to the ED

## 2017-09-09 NOTE — Assessment & Plan Note (Signed)
>>  ASSESSMENT AND PLAN FOR BILATERAL LEG EDEMA WRITTEN ON 09/09/2017 10:39 AM BY Beaulah Dinning, MD  Continues to have edematous lower extremities with HFrEF 35%-40%. Patient does not know what her dry weight is. Per Epic review it seems that the goal dry weight is around 300 pounds. 11 days ago she was 310, 4 days ago she was 305 and today she is 311. Reassuringly lungs are clear to auscultation and patient denies any shortness of breath or chest pain. Her last BMP does show increasing creatinine. -Continue to encourage elevation of legs and compression stockings -Continue Lasix 40 mg twice a day for now, she has follow-up with cardiology tomorrow. She is also going to get a BMP tomorrow morning. will defer any change in diuretics to cardiology team. Appreciate their assistance.  -Discussed red flag symptoms and reasons to go to the ED

## 2017-09-10 ENCOUNTER — Encounter: Payer: Self-pay | Admitting: Physician Assistant

## 2017-09-10 ENCOUNTER — Ambulatory Visit (INDEPENDENT_AMBULATORY_CARE_PROVIDER_SITE_OTHER): Payer: Medicare Other | Admitting: Physician Assistant

## 2017-09-10 ENCOUNTER — Other Ambulatory Visit: Payer: Medicare Other | Admitting: *Deleted

## 2017-09-10 VITALS — BP 118/80 | HR 72 | Ht 69.0 in | Wt 312.4 lb

## 2017-09-10 DIAGNOSIS — N183 Chronic kidney disease, stage 3 unspecified: Secondary | ICD-10-CM

## 2017-09-10 DIAGNOSIS — I481 Persistent atrial fibrillation: Secondary | ICD-10-CM

## 2017-09-10 DIAGNOSIS — I5023 Acute on chronic systolic (congestive) heart failure: Secondary | ICD-10-CM | POA: Diagnosis not present

## 2017-09-10 DIAGNOSIS — I509 Heart failure, unspecified: Secondary | ICD-10-CM | POA: Diagnosis not present

## 2017-09-10 DIAGNOSIS — G4733 Obstructive sleep apnea (adult) (pediatric): Secondary | ICD-10-CM | POA: Diagnosis not present

## 2017-09-10 DIAGNOSIS — I25810 Atherosclerosis of coronary artery bypass graft(s) without angina pectoris: Secondary | ICD-10-CM

## 2017-09-10 DIAGNOSIS — I4819 Other persistent atrial fibrillation: Secondary | ICD-10-CM

## 2017-09-10 DIAGNOSIS — I255 Ischemic cardiomyopathy: Secondary | ICD-10-CM

## 2017-09-10 LAB — BASIC METABOLIC PANEL
BUN/Creatinine Ratio: 22 (ref 12–28)
BUN: 33 mg/dL — ABNORMAL HIGH (ref 8–27)
CALCIUM: 9.4 mg/dL (ref 8.7–10.3)
CHLORIDE: 99 mmol/L (ref 96–106)
CO2: 27 mmol/L (ref 20–29)
Creatinine, Ser: 1.5 mg/dL — ABNORMAL HIGH (ref 0.57–1.00)
GFR calc Af Amer: 38 mL/min/{1.73_m2} — ABNORMAL LOW (ref >59)
GFR calc non Af Amer: 33 mL/min/{1.73_m2} — ABNORMAL LOW (ref >59)
GLUCOSE: 156 mg/dL — AB (ref 65–99)
POTASSIUM: 4.2 mmol/L (ref 3.5–5.2)
SODIUM: 142 mmol/L (ref 134–144)

## 2017-09-10 MED ORDER — FUROSEMIDE 40 MG PO TABS: 60.0000 mg | ORAL_TABLET | Freq: Every day | ORAL | 3 refills | Status: DC

## 2017-09-10 NOTE — Patient Instructions (Signed)
Medication Instructions:  1. INCREASE LASIX TO 60 MG DAILY (THIS WILL BE 1 AND 1/2 TABS DAILY OF THE 40 MG TABLET)  2. YOU HAVE BEEN GIVEN ELIQUIS SAMPLES AS WELL AS PAPERWORK TO FILL OUT FOR THE ASSISTANCE PROGRAM; PLEASE FILL PAPERWORK ASAP AND RETURN TO OUR OFFICE SO THAT WE MAY PROCESS YOUR PAPERWORK  Labwork: BMET TO BE DONE IN 2 WEEKS  Testing/Procedures: NONE ORDERED TODAY  Follow-Up: DR. Angelena Form IN 1 MONTH  Any Other Special Instructions Will Be Listed Below (If Applicable).     If you need a refill on your cardiac medications before your next appointment, please call your pharmacy.

## 2017-09-10 NOTE — Progress Notes (Signed)
Cardiology Office Note:    Date:  09/10/2017   ID:  [name], DOB [birth year], MRN [id number]  PCP:  Carlyle Dolly, MD  Cardiologist:  Dr. Lauree Chandler   Nephrologist: Dr. Biagio Quint   Referring MD: Carlyle Dolly, MD   Chief Complaint  Patient presents with  . Congestive Heart Failure    Follow-up    History of Present Illness:    [name] is a 76 y.o. female with a hx of coronary artery disease status post coronary artery bypass grafting in 9528, systolic heart failure secondary to ischemic cardiomyopathy, persistent atrial fibrillation and prior stroke in May 2017 (INR was subtherapeutic), diabetes, stage III chronic kidney disease, morbid obesity, pulmonary hypertension, anemia, prior GI bleed in May 2018.  She takes apixaban for anticoagulation.  She has a history of dizziness with benazepril and Spironolactone and prior hypotension reaction with angiotensin receptor blocker.  Last seen 07/23/17 in follow-up for heart failure.  She was continued on higher dose diuretic for several more days.  Of note, recent sleep study demonstrates severe sleep apnea.  Since last seen, she has called in with increasing weight requiring adjustments in her Lasix therapy.  [name] returns for follow-up.  Her weight is up to 312 pounds.  She feels better when she is at 300 pounds.  She feels like she can get around better.  She has somewhat less shortness of breath when her weight is down to 300.  She denies paroxysmal nocturnal dyspnea.  Her lower extremity edema has worsened.  She denies chest discomfort or syncope.  Prior CV studies:   The following studies were reviewed today:  Echo 03/30/16 EF 35-40, diff HK, high ventricular filling pressure, trivial AI, MAC, mild MR, mild BAE, mod TR, PASP 49  Echo 2/11/4 EF 40-45, mod LAE, mild reduced RVSF, mod RAE  Cardiac Catheterization 05/2008 LAD mid 90, D2 95 OM occluded RCA mid 95, dist 30/70/90 EF  20 Referred for CABG  Past Medical History:  Diagnosis Date  . ACC/AHA stage C congestive heart failure due to ischemic cardiomyopathy ([place]) 01/31/2009   Qualifier: Diagnosis of  By: Olevia Perches, MD, Glenetta Hew   . ANXIETY 01/31/2009   Qualifier: Diagnosis of  By: Lovette Cliche, CNA, Christy    . Arthritis    "qwhere" (03/30/2016)  . Benign essential HTN   . CAD (coronary artery disease) 05/2008   a. s/p CABG in 2009.  Marland Kitchen Cerebrovascular accident (CVA) due to embolism of right middle cerebral artery ([place])   . Chronic anticoagulation   . Chronic kidney disease (CKD), stage III (moderate) (HCC)   . Chronic lower back pain   . Chronic pain syndrome   . Chronic systolic CHF (congestive heart failure) ([place])   . DM type 2 with diabetic peripheral neuropathy ([place])   . Facial weakness, post-stroke   . Gastrointestinal hemorrhage 03/26/2017  . Hemiparesis ([place])   . History of CVA with residual deficit 03/26/2017  . Hyperlipidemia   . Hypertension   . Ischemic cardiomyopathy   . Longstanding persistent atrial fibrillation ([place])    a. Postoperative in 2009;  S/P transesophageal echocardiography-guided cardioversion, previously on Amiodarone and Coumadin. b. recurrent in April 2013 -> pt elected rate control strategy, initially Coumadin -> had stroke with subtherapeutic INR in 03/2016 and transitioned to Eliquis.  . Morbid obesity ([place]) 03/26/2017  . Persistent atrial fibrillation ([place])   . Thrombocytopenia ([place])     Past Surgical History:  Procedure Laterality Date  .  APPENDECTOMY  1946  . CATARACT EXTRACTION Right 2012  . COLONOSCOPY WITH PROPOFOL Left 03/27/2017   Procedure: COLONOSCOPY WITH PROPOFOL;  Surgeon: Ronnette Juniper, MD;  Location: [facility];  Service: Gastroenterology;  Laterality: Left;  . CORONARY ANGIOPLASTY WITH STENT PLACEMENT  2009   "put 2 stents in"  . HERNIA REPAIR    . JOINT REPLACEMENT    . TEE WITH CARDIOVERSION     Postoperative in 2009;  S/P transesophageal  echocardiography-guided cardioversion,  . TOTAL KNEE ARTHROPLASTY Right 1994  . VENTRAL HERNIA REPAIR  10/1999   With mesh    Current Medications: Current Meds  Medication Sig  . apixaban (ELIQUIS) 5 MG TABS tablet Take 1 tablet (5 mg total) by mouth 2 (two) times daily.  Marland Kitchen atorvastatin (LIPITOR) 40 MG tablet Take 40 mg by mouth daily at 6 PM.  . carvedilol (COREG) 6.25 MG tablet Take 1 tablet (6.25 mg total) by mouth 2 (two) times daily with a meal.  . cholecalciferol (VITAMIN D) 1000 UNITS tablet Take 1,000 Units by mouth 2 (two) times daily.  Marland Kitchen docusate sodium (COLACE) 100 MG capsule Take 200 mg by mouth at bedtime.   . gabapentin (NEURONTIN) 400 MG capsule Take 400 mg by mouth 2 (two) times daily.  . insulin glargine (LANTUS) 100 UNIT/ML injection Inject 23 Units into the skin at bedtime.  . nitroGLYCERIN (NITROSTAT) 0.4 MG SL tablet Place 0.4 mg under the tongue every 5 (five) minutes as needed for chest pain (up to 3 doses).   . potassium chloride SA (K-DUR,KLOR-CON) 20 MEQ tablet Take 1 tablet (20 mEq total) by mouth daily.  Marland Kitchen rOPINIRole (REQUIP) 0.5 MG tablet Take 2 tablets (1 mg total) by mouth at bedtime.  . traMADol (ULTRAM) 50 MG tablet Take 1-2 tablets (50-100 mg total) by mouth See admin instructions. Take 2 tablets (100 mg) by mouth every morning and 1 tablet (50 mg) at night  . vitamin B-12 (CYANOCOBALAMIN) 1000 MCG tablet Take 1,000 mcg by mouth daily.  . [DISCONTINUED] furosemide (LASIX) 40 MG tablet Take 1 tablet (40 mg total) by mouth 2 (two) times daily.     Allergies:   Benazepril; Angiotensin receptor blockers; Fe-succ-c-thre-b12-des stomach; Spironolactone; and Sulfamethoxazole-trimethoprim   Social History  Substance Use Topics  . Smoking status: Never Smoker  . Smokeless tobacco: Never Used  . Alcohol use No     Family Hx: The patient's family history includes Clotting disorder in her mother; Emphysema in her father; Lung cancer in her brother. There is no  history of Heart disease.  ROS:   Please see the history of present illness.    Review of Systems  HENT: Positive for hearing loss.   Eyes: Positive for visual disturbance.  Cardiovascular: Positive for leg swelling.  Hematologic/Lymphatic: Bruises/bleeds easily.  Musculoskeletal: Positive for back pain and joint pain.  Neurological: Positive for loss of balance.   All other systems reviewed and are negative.   EKGs/Labs/Other Test Reviewed:    EKG:  EKG is  ordered today.  The ekg ordered today demonstrates atrial fibrillation, HR 72  Recent Labs: 03/25/2017: ALT 14; B Natriuretic Peptide 149.9 07/19/2017: Hemoglobin 10.1; Platelets 113 09/03/2017: BUN 28; Creatinine, Ser 1.58; Potassium 3.9; Sodium 140   Recent Lipid Panel Lab Results  Component Value Date/Time   CHOL 102 03/30/2016 07:40 AM   TRIG 50 03/30/2016 07:40 AM   HDL 39 (L) 03/30/2016 07:40 AM   CHOLHDL 2.6 03/30/2016 07:40 AM   LDLCALC 53 03/30/2016 07:40 AM  Physical Exam:    VS:  BP 118/80   Pulse 72   Ht 5' 9"  (1.753 m)   Wt (!) 312 lb 6.4 oz (141.7 kg)   LMP  (LMP Unknown)   BMI 46.13 kg/m     Wt Readings from Last 3 Encounters:  09/10/17 (!) 312 lb 6.4 oz (141.7 kg)  09/09/17 (!) 311 lb (141.1 kg)  09/05/17 (!) 306 lb 9.6 oz (139.1 kg)     Physical Exam  Constitutional: She is oriented to person, place, and time. She appears well-developed and well-nourished. No distress.  HENT:  Head: Normocephalic and atraumatic.  Eyes: No scleral icterus.  Neck: No JVD (I cannot appreciate JVD at 90 degrees) present.  Cardiovascular: Normal rate.  An irregularly irregular rhythm present.  No murmur heard. Pulmonary/Chest: Effort normal. She has no rales.  Abdominal: Soft.  Musculoskeletal: She exhibits edema (2+ bilateral LE edema).  Neurological: She is alert and oriented to person, place, and time.  Skin: Skin is warm and dry.  Dime sized ulcer noted lateral R leg    ASSESSMENT:    1. Acute on  chronic systolic (congestive) heart failure ([place])   2. Coronary artery disease involving coronary bypass graft of native heart without angina pectoris   3. CKD (chronic kidney disease) stage 3, GFR 30-59 ml/min (HCC)   4. OSA (obstructive sleep apnea)   5. Persistent atrial fibrillation (HCC)    PLAN:    In order of problems listed above:  1.  Acute on chronic systolic (congestive) heart failure (HCC) Her weight is back up to 312 pounds.  She feels better when she is at 300 pounds.  We had reduced her Lasix due to worsening creatinine.  I think we are going to have to accept a higher creatinine to keep her closer to a stable volume.  She does have pulmonary hypertension and some RV dysfunction on prior echocardiograms.  This is probably related to her obstructive sleep apnea.  She may do better on torsemide than furosemide.  However, I will try to adjust her furosemide first.  If we continue to have issues with maintaining her volume, I would change her to torsemide 20 mg twice daily.  -Increase Lasix to 60 mg twice daily  -Repeat BMET 2 weeks  -Follow-up with Dr. Angelena Form 1 month  2.  Coronary artery disease involving coronary bypass graft of native heart without angina pectoris History of CABG in 2009.  She is not on aspirin as she is on Eliquis.  She is not having anginal symptoms.  Continue statin.  3.  CKD (chronic kidney disease) stage 3, GFR 30-59 ml/min (HCC) Repeat BMET 2 weeks after adjusting the Lasix.  4.  OSA (obstructive sleep apnea) She was recently placed on CPAP.  5.  Persistent atrial fibrillation (HCC)  Rate is controlled.  She is tolerating Eliquis.   Dispo:  Return in about 4 weeks (around 10/08/2017) for Close Follow Up w/ Dr. Angelena Form.   Medication Adjustments/Labs and Tests Ordered: Current medicines are reviewed at length with the patient today.  Concerns regarding medicines are outlined above.  Tests Ordered: Orders Placed This Encounter  Procedures  .  Basic Metabolic Panel (BMET)  . EKG 12-Lead   Medication Changes: Meds ordered this encounter  Medications  . furosemide (LASIX) 40 MG tablet    Sig: Take 1.5 tablets (60 mg total) by mouth daily.    Dispense:  135 tablet    Refill:  3    Signed,  Richardson Dopp, PA-C  09/10/2017 10:36 AM    [facility] [address], [place], [place]  [address] Phone: [phone]; Fax: [phone]

## 2017-09-11 ENCOUNTER — Other Ambulatory Visit: Payer: Self-pay | Admitting: *Deleted

## 2017-09-11 ENCOUNTER — Telehealth: Payer: Self-pay | Admitting: *Deleted

## 2017-09-11 DIAGNOSIS — H26493 Other secondary cataract, bilateral: Secondary | ICD-10-CM | POA: Diagnosis not present

## 2017-09-11 DIAGNOSIS — H17821 Peripheral opacity of cornea, right eye: Secondary | ICD-10-CM | POA: Diagnosis not present

## 2017-09-11 DIAGNOSIS — H43811 Vitreous degeneration, right eye: Secondary | ICD-10-CM | POA: Diagnosis not present

## 2017-09-11 DIAGNOSIS — H353132 Nonexudative age-related macular degeneration, bilateral, intermediate dry stage: Secondary | ICD-10-CM | POA: Diagnosis not present

## 2017-09-11 DIAGNOSIS — E113313 Type 2 diabetes mellitus with moderate nonproliferative diabetic retinopathy with macular edema, bilateral: Secondary | ICD-10-CM | POA: Diagnosis not present

## 2017-09-11 MED ORDER — FUROSEMIDE 40 MG PO TABS: 60.0000 mg | ORAL_TABLET | Freq: Every day | ORAL | 3 refills | Status: DC

## 2017-09-11 NOTE — Telephone Encounter (Signed)
-----   Message from Liliane Shi, [place] sent at 09/10/2017  9:59 PM EDT ----- Please call the patient Creatinine stable.Continue current medications and follow up as planned.  Richardson Dopp, PA-C 09/10/2017 9:59 PM

## 2017-09-11 NOTE — Telephone Encounter (Signed)
Tried to reach pt to go over lab results, pt does not have MY CHART.

## 2017-09-12 ENCOUNTER — Telehealth: Payer: Self-pay

## 2017-09-12 ENCOUNTER — Encounter: Payer: Self-pay | Admitting: *Deleted

## 2017-09-12 DIAGNOSIS — H353122 Nonexudative age-related macular degeneration, left eye, intermediate dry stage: Secondary | ICD-10-CM | POA: Diagnosis not present

## 2017-09-12 DIAGNOSIS — H353112 Nonexudative age-related macular degeneration, right eye, intermediate dry stage: Secondary | ICD-10-CM | POA: Diagnosis not present

## 2017-09-12 DIAGNOSIS — E113392 Type 2 diabetes mellitus with moderate nonproliferative diabetic retinopathy without macular edema, left eye: Secondary | ICD-10-CM | POA: Diagnosis not present

## 2017-09-12 DIAGNOSIS — H43822 Vitreomacular adhesion, left eye: Secondary | ICD-10-CM | POA: Diagnosis not present

## 2017-09-12 DIAGNOSIS — H353221 Exudative age-related macular degeneration, left eye, with active choroidal neovascularization: Secondary | ICD-10-CM | POA: Diagnosis not present

## 2017-09-12 NOTE — Telephone Encounter (Signed)
-----   Message from Liliane Shi, [place] sent at 09/10/2017  9:59 PM EDT ----- Please call the patient Creatinine stable.Continue current medications and follow up as planned.  Richardson Dopp, PA-C 09/10/2017 9:59 PM

## 2017-09-12 NOTE — Telephone Encounter (Signed)
No answer x 3. I will mail out letter to the pt to call back for her results.

## 2017-09-12 NOTE — Telephone Encounter (Signed)
Call to let patient know that the lab will open at 9 am on 09-25-17. While on the phone with her she stated that her home health aide told her that she has 4 new little blister on the front of her left leg. She wanted to discuss if it was related to fluid buildup since that is what she is being treated for. She also stated that she has made her PCP office aware also.

## 2017-09-13 ENCOUNTER — Ambulatory Visit (INDEPENDENT_AMBULATORY_CARE_PROVIDER_SITE_OTHER): Payer: Medicare Other | Admitting: Student

## 2017-09-13 ENCOUNTER — Encounter: Payer: Self-pay | Admitting: Student

## 2017-09-13 VITALS — BP 136/82 | HR 74 | Temp 97.9°F | Ht 69.0 in | Wt 314.2 lb

## 2017-09-13 DIAGNOSIS — I509 Heart failure, unspecified: Secondary | ICD-10-CM

## 2017-09-13 DIAGNOSIS — I878 Other specified disorders of veins: Secondary | ICD-10-CM | POA: Insufficient documentation

## 2017-09-13 DIAGNOSIS — I255 Ischemic cardiomyopathy: Secondary | ICD-10-CM | POA: Diagnosis not present

## 2017-09-13 MED ORDER — TORSEMIDE 20 MG PO TABS: ORAL_TABLET | ORAL | 0 refills | Status: DC

## 2017-09-13 MED ORDER — FUROSEMIDE 40 MG PO TABS: 60.0000 mg | ORAL_TABLET | Freq: Two times a day (BID) | ORAL | 3 refills | Status: DC

## 2017-09-13 NOTE — Telephone Encounter (Signed)
Pt has been notified of lab results by phone with verbal understanding. 

## 2017-09-13 NOTE — Telephone Encounter (Signed)
I s/w pt this morning to check on her based on call from yesterday. Pt s/w CMA Elisabeth Most on 09/12/17 in regards to lab appt on 11/14 that our lab will not open until 9 am due to a meeting. In 11/1 note states pt tells [place] found 4 new blisters on her left leg and advised to let our office know. During my conversation today with the pt she states to me that Richardson Dopp, PA yesterday increased her lasix to 60 mg BID. I did not see this noted anywhere in phone note yesterday by Elisabeth Most, CMA, nor was the medication list changed to reflect increase.  I will update medication list today. Pt denies any increase in sob. Pt weight yesterday was 310 lb and today 311 lb. Pt states she is seeing PCP this morning about blisters on her left leg . I advised pt that I will d/w further with Richardson Dopp, PA as to if any new recommendations and we will watch for PCP notes as well from today's ov @ 11:10. Pt thanked me for my call.

## 2017-09-13 NOTE — Progress Notes (Signed)
Subjective:    [name] is a 76 y.o. old female here for left leg swelling and blister  HPI Left leg swelling:  She had history of CHF and venous insufficiency.  She was seen by her cardiologist about 2 days ago.  At that time, her Lasix was increased from 40 mg to 60 mg twice a day.  She denies significant improvement in her swelling since then.  She does not think she is making much urine as she expected.  Yesterday, her CNA noted a blister on her leg.   She reports having similar blister on the right leg in the past that led to ulceration.  So she came in before this blister gets worse. She states that her dry weight is about 300-305 pounds.  She reports watching his salt intake.  She denies drinking alcohol.  She reports good compliance with her medications.  She denies dyspnea with ambulation.  However, her son disagrees with this stating that she feels short of breath if she walks about 50-100 yards.  This is unchanged from 2 days ago.  She denies fever, chills and chest pain  PMH/Problem List: has Hyperlipidemia; ACC/AHA stage C congestive heart failure due to ischemic cardiomyopathy ([place]); CKD (chronic kidney disease) stage 3, GFR 30-59 ml/min ([place]); Chronic anticoagulation; Cerebrovascular accident (CVA) due to embolism of right middle cerebral artery ([place]); Benign essential HTN; DM type 2 with diabetic peripheral neuropathy ([place]); Thrombocytopenia ([place]); Persistent atrial fibrillation ([place]); Chronic pain syndrome; Coronary artery disease involving coronary bypass graft of native heart without angina pectoris; History of CVA with residual deficit; Pulmonary hypertension ([place]); Morbid obesity ([place]); Rectal bleeding; Abdominal mass, RLQ (right lower quadrant); Anemia; Bilateral leg edema; and Open wound of right knee, leg, and ankle with complication on her problem list.   has a past medical history of ACC/AHA stage C congestive heart failure due to ischemic cardiomyopathy ([place]) (01/31/2009); ANXIETY  (01/31/2009); Arthritis; Benign essential HTN; CAD (coronary artery disease) (05/2008); Cerebrovascular accident (CVA) due to embolism of right middle cerebral artery ([place]); Chronic anticoagulation; Chronic kidney disease (CKD), stage III (moderate) ([place]); Chronic lower back pain; Chronic pain syndrome; Chronic systolic CHF (congestive heart failure) ([place]); DM type 2 with diabetic peripheral neuropathy ([place]); Facial weakness, post-stroke; Gastrointestinal hemorrhage (03/26/2017); Hemiparesis ([place]); History of CVA with residual deficit (03/26/2017); Hyperlipidemia; Hypertension; Ischemic cardiomyopathy; Longstanding persistent atrial fibrillation ([place]); Morbid obesity ([place]) (03/26/2017); Persistent atrial fibrillation ([place]); and Thrombocytopenia ([place]).  FH:  Family History  Problem Relation Age of Onset  . Clotting disorder Mother        Cerebral hemorrhage  . Emphysema Father        COD  . Lung cancer Brother   . Heart disease Neg Hx     SH Social History  Substance Use Topics  . Smoking status: Never Smoker  . Smokeless tobacco: Never Used  . Alcohol use No    Review of Systems Review of systems negative except for pertinent positives and negatives in history of present illness above.     Objective:     Vitals:   09/13/17 1145  BP: 136/82  Pulse: 74  Temp: 97.9 F (36.6 C)  TempSrc: Oral  SpO2: 98%  Weight: (!) 314 lb 3.2 oz (142.5 kg)  Height: 5\' 9"  (1.753 m)   Body mass index is 46.4 kg/m.  Physical Exam GEN: appears well, no apparent distress. Head: normocephalic and atraumatic  Eyes: conjunctiva without injection, sclera anicteric Oropharynx: mmm without erythema or exudation HEM: negative for cervical  or periauricular lymphadenopathies CVS: RRR, nl S1&S2, no murmurs, 2+ edema bilaterally RESP: no IWOB, good air movement bilaterally, CTAB MSK: no focal tenderness or notable swelling SKIN: Circular erythematous skin over lower half of the left shin, no increased  warmth to touch, no tenderness.  Noted a couple of blisters over the distal margin of the erythematous skin.  See picture for more.    NEURO: alert and oiented appropriately, no gross deficits  PSYCH: euthymic mood with congruent affect    Assessment and Plan:  Leg swelling/erythema/blister: these are likely due to venous stasis. Her CHF has some role to play as well given dyspnea with exertion although this is unchanged from 2 days ago.  Surprisingly, she has no crackles on lung exam.  She is satting in upper 90s on room air. She has no constitutional symptoms to think of cellulitis.  The erythema is not warm to touch. Doubt the DVT with bilateral edema.   Patient had her Lasix increased from 40 mg to 60 mg twice daily by her cardiologist 2 days ago.however,  she has not seen significant change in the urine output since then. Her weight has gone up by 2 pounds in the last 2 days. At this point, I recommended switching to torsemide 40 mg twice daily at least for the next 2-3 days until she is back to her dry weight, which appears to be 305 pounds.  Then, I recommended taking 40 mg daily. I also recommended weighing herself every morning, elevating her legs and taking her potassium.  We will check BMP today.  Follow-up in 1 week.  We will recheck BMP when she returns.   Return in about 1 week (around 09/20/2017) for Leg swelling.  Mercy Riding, MD 09/13/17 Pager: [phone]

## 2017-09-13 NOTE — Patient Instructions (Addendum)
It was great seeing you today! We have addressed the following issues today  1. Leg swelling: We have changed your medication to torsemide.  Stop taking Lasix.  Take 40 mg of torsemide in the morning and in the afternoon until you weight is close to your dry weight (305 pounds).  Then you can take 40 mg daily.  Check your weight every day in the morning.  I also recommend elevating your legs.  Continue taking your potassium.  Follow-up in 1 week or sooner if you have concern.  If we did any lab work today, and the results require attention, either me or my nurse will get in touch with you. If everything is normal, you will get a letter in mail and a message via . If you don't hear from [place] in two weeks, please give [place] a call. Otherwise, we look forward to seeing you again at your next visit. If you have any questions or concerns before then, please call the clinic at [phone].  Please bring all your medications to every doctors visit  Sign up for My Chart to have easy access to your labs results, and communication with your Primary care physician.    Please check-out at the front desk before leaving the clinic.    Take Care,   Dr. Cyndia Skeeters

## 2017-09-13 NOTE — Telephone Encounter (Signed)
Lasix increased to 60 mg Twice daily at [facility] 09/10/17. Let's see what the PCP thinks about her blisters. Let us know if legs more swollen or weight increases. Richardson Dopp, PA-C    09/13/2017 2:22 PM

## 2017-09-14 LAB — BASIC METABOLIC PANEL
BUN/Creatinine Ratio: 25 (ref 12–28)
BUN: 43 mg/dL — AB (ref 8–27)
CALCIUM: 9.4 mg/dL (ref 8.7–10.3)
CO2: 26 mmol/L (ref 20–29)
CREATININE: 1.74 mg/dL — AB (ref 0.57–1.00)
Chloride: 101 mmol/L (ref 96–106)
GFR calc Af Amer: 32 mL/min/{1.73_m2} — ABNORMAL LOW (ref >59)
GFR, EST NON AFRICAN AMERICAN: 28 mL/min/{1.73_m2} — AB (ref >59)
Glucose: 120 mg/dL — ABNORMAL HIGH (ref 65–99)
Potassium: 4.3 mmol/L (ref 3.5–5.2)
Sodium: 141 mmol/L (ref 134–144)

## 2017-09-16 ENCOUNTER — Telehealth: Payer: Self-pay | Admitting: Physician Assistant

## 2017-09-16 NOTE — Telephone Encounter (Signed)
New message   Patient calling back to report weight. Her weight this morning is 312.02. Patient also states she has missed placed forms that were given to her by the nurse. Please Call.   Pt c/o swelling: STAT is pt has developed SOB within 24 hours  1) How much weight have you gained and in what time span? n/a  2) If swelling, where is the swelling located? LEGS  3) Are you currently taking a fluid pill? YES  4) Are you currently SOB? NO  5) Do you have a log of your daily weights (if so, list)? 312.02lbs today, 312.04, 314,   6) Have you gained 3 pounds in a day or 5 pounds in a week? NO  7) Have you traveled recently? NO

## 2017-09-16 NOTE — Telephone Encounter (Signed)
See phone note from 09/16/17

## 2017-09-16 NOTE — Telephone Encounter (Signed)
I s/w pt who wanted to make sure Richardson Dopp, PA saw office note from PCP on Friday 09/13/17. PCP d/c'd lasix and changed pt to torsemide 40 mg BID until pt's dry weight is 305 lb or <. Per PCP once to dry weight 305 lb change torsemide to 40 mg daily. Pt's weight today is 312.02 lb which is down 2 lb's from 11/2 ov with PCP. Pt is continuing with plan from PCP and just wanted to make sure we were aware of change. Pt also states she lost the Eliquis asst paperwork we gave her Friday 11/2. I will mail out new forms to her today. Pt thanked me for my help today. Advised pt to continue to monitor her weight.

## 2017-09-16 NOTE — Telephone Encounter (Signed)
Please make sure that she has follow-up basic metabolic panel planned 1 week after starting on torsemide. Richardson Dopp, PA-C 09/16/2017 12:02 PM

## 2017-09-16 NOTE — Telephone Encounter (Signed)
She has f/u appt 09/23/17 with PCP. Per PCP note 11/2 bmp to be done at f/u in 1 week.

## 2017-09-16 NOTE — Telephone Encounter (Signed)
I s/w pt to confirm she has appt 11/2 with PCP and with lab work Artist) that day. Pt answered yes to appt and labs. I advised pt I will cancel out our lab appt scheduled for 11/14 since she will see PCP 11/12. Pt thanked me for our help.

## 2017-09-18 ENCOUNTER — Telehealth: Payer: Self-pay | Admitting: Physician Assistant

## 2017-09-18 NOTE — Telephone Encounter (Signed)
Patient received letter that our office was not able to reach her and patient would like to know her lab results.

## 2017-09-19 NOTE — Telephone Encounter (Signed)
Spoke with pt and made her aware that letter was sent the day before she spoke with Arbie Cookey, CMA about results.  Reminded pt of results.  Pt verbalized understanding and was appreciative for call.

## 2017-09-23 ENCOUNTER — Telehealth: Payer: Self-pay | Admitting: Cardiovascular Disease

## 2017-09-23 ENCOUNTER — Ambulatory Visit (INDEPENDENT_AMBULATORY_CARE_PROVIDER_SITE_OTHER): Payer: Medicare Other | Admitting: Family Medicine

## 2017-09-23 ENCOUNTER — Encounter: Payer: Self-pay | Admitting: Family Medicine

## 2017-09-23 ENCOUNTER — Other Ambulatory Visit: Payer: Self-pay

## 2017-09-23 DIAGNOSIS — R600 Localized edema: Secondary | ICD-10-CM

## 2017-09-23 MED ORDER — TRAMADOL HCL 50 MG PO TABS: 50.0000 mg | ORAL_TABLET | ORAL | 0 refills | Status: DC

## 2017-09-23 NOTE — Progress Notes (Signed)
Subjective:    Patient ID: [name] , female   DOB: 06/23/1941 , 76 y.o..   MRN: [id number]  HPI  CLEMIE GENERAL is a 76 yo F with PMH of CAD with CABG 2009, ischemic cardiomyopathy, persistent atrial fibrillation, HFrEF 35%-40%, HLD, DM, CKD stage 3 here for  Chief Complaint  Patient presents with  . Leg Swelling    1. Leg swelling and weight gain: Patient was seen on 11/2 by Dr. Cyndia Skeeters for similar concerns. Prior to her visit with Dr. Cyndia Skeeters her Lasix was dosed at 60 mg twice a day. She was switched to torsemide 40 mg twice daily.  Patient was supposed to go down to 40 mg once daily after 2-3 days but she has been taking it twice a day instead.  Patient notes that she feels that her weight has gone up, her swelling in the legs is the same, and she continues to have dyspnea on exertion.  She states that she has been drinking no more than 64 ounces of water a day, 2 cans of diet caffeine free soda, and 8 ounces of unsweetened tea.  She feels like she is urinating about the same as she was before when she was on Lasix.  She weighed herself this morning and the scale said 315.  Review of Systems: Per HPI.   Past Medical History: Patient Active Problem List   Diagnosis Date Noted  . Venous stasis 09/13/2017  . Open wound of right knee, leg, and ankle with complication 57/32/2025  . Bilateral leg edema 07/18/2017  . Anemia 05/07/2017  . Abdominal mass, RLQ (right lower quadrant)   . History of CVA with residual deficit 03/26/2017  . Pulmonary hypertension ([place]) 03/26/2017  . Morbid obesity ([place]) 03/26/2017  . Rectal bleeding   . Persistent atrial fibrillation ([place])   . Chronic pain syndrome   . Coronary artery disease involving coronary bypass graft of native heart without angina pectoris   . Benign essential HTN   . DM type 2 with diabetic peripheral neuropathy ([place])   . Thrombocytopenia ([place])   . Cerebrovascular accident (CVA) due to embolism of right middle cerebral artery  ([place])   . CKD (chronic kidney disease) stage 3, GFR 30-59 ml/min ([facility]) 03/30/2016  . Chronic anticoagulation 03/30/2016  . Hyperlipidemia 01/31/2009  . ACC/AHA stage C congestive heart failure due to ischemic cardiomyopathy ([place]) 01/31/2009    Medications: reviewed and updated Current Outpatient Medications  Medication Sig Dispense Refill  . apixaban (ELIQUIS) 5 MG TABS tablet Take 1 tablet (5 mg total) by mouth 2 (two) times daily. 180 tablet 3  . atorvastatin (LIPITOR) 40 MG tablet Take 40 mg by mouth daily at 6 PM.    . carvedilol (COREG) 6.25 MG tablet Take 1 tablet (6.25 mg total) by mouth 2 (two) times daily with a meal. 60 tablet 6  . cholecalciferol (VITAMIN D) 1000 UNITS tablet Take 1,000 Units by mouth 2 (two) times daily.    Marland Kitchen docusate sodium (COLACE) 100 MG capsule Take 200 mg by mouth at bedtime.     . gabapentin (NEURONTIN) 400 MG capsule Take 400 mg by mouth 2 (two) times daily.    . insulin glargine (LANTUS) 100 UNIT/ML injection Inject 23 Units into the skin at bedtime.    . nitroGLYCERIN (NITROSTAT) 0.4 MG SL tablet Place 0.4 mg under the tongue every 5 (five) minutes as needed for chest pain (up to 3 doses).     . potassium chloride SA (  K-DUR,KLOR-CON) 20 MEQ tablet Take 1 tablet (20 mEq total) by mouth daily. 30 tablet 1  . rOPINIRole (REQUIP) 0.5 MG tablet Take 2 tablets (1 mg total) by mouth at bedtime. 30 tablet 1  . torsemide (DEMADEX) 20 MG tablet Take 40 mg (2 tablets) in the morning and in the afternoon until you are close your dry weight (305 lbs). Then, go back to 40 mg daily. 120 tablet 0  . traMADol (ULTRAM) 50 MG tablet Take 1-2 tablets (50-100 mg total) See admin instructions by mouth. Take 2 tablets (100 mg) by mouth every morning and 1 tablet (50 mg) at night 40 tablet 0  . vitamin B-12 (CYANOCOBALAMIN) 1000 MCG tablet Take 1,000 mcg by mouth daily.     No current facility-administered medications for this visit.     Social Hx:  reports that  has never  smoked. she has never used smokeless tobacco.   Objective:   BP 110/60   Pulse 83   Temp 97.8 F (36.6 C) (Oral)   Ht 5\' 9"  (1.753 m)   Wt (!) 320 lb (145.2 kg)   LMP  (LMP Unknown)   SpO2 99%   BMI 47.26 kg/m  Physical Exam  Gen: NAD, alert, cooperative with exam, well-appearing, morbidly obese HEENT: NCAT, PERRL, clear conjunctiva, oropharynx clear, supple neck Cardiac: Irregular rhythm, normal rate, faint heart sounds, systolic murmur present, 2+ pitting edema from knees down, capillary refill brisk  Respiratory: Clear to auscultation bilaterally, no wheezes, non-labored breathing Gastrointestinal: soft, non tender, non distended, bowel sounds present Skin: ~2-3cm scab on left lateral leg above ankle Neurological: no gross deficits.  Psych: good insight, normal mood and affect  Assessment & Plan:  Bilateral leg edema Multifactorial given hx of HFrEF 35-40% and venous stasis. Has been on Torsemide 40 mg BID since 09/13/17 (about 10 days). Her weight on our scale today is 320. Her weight at home today was reported to be 315 which is not too far off from what she has been most recently. Dry weight is supposed to be around 305 lbs. No signs of CHF exacerbation with clear lungs, normal work of breathing, 99% O2 on room air. Edema is slightly more than baseline.  The plan is as follows - Increase Torsemide to 60 mg in morning and continue 40 mg in afternoon - Discussed that it would be best to follow up with cardiology since they were titrating her diuretics previously but we would be happy to see her if she is unable to get an appt - Will see if she can follow up with cardiology this week, if not, she will come back to our clinic - Will need BMP at follow up  Smitty Cords, MD [facility], PGY-3

## 2017-09-23 NOTE — Telephone Encounter (Signed)
Walk In Pt Form-Sealed Envelope Dropped off. Placed in [facility] Doc box

## 2017-09-23 NOTE — Patient Instructions (Signed)
Thank you for coming in today, it was so nice to see you! Today we talked about:    Weight gain and shortness of breath: We have increased your torsemide today to 60 mg in the morning (3 pills) and continue 40 mg in the afternoon (2 pills)  If your weight continues to go up, please come see [place] by the end of the week  If your weight stays the same or goes down, we can see you next week  I am going to send your note to your cardiologist for their information   If you have any questions or concerns, please do not hesitate to call the office at [phone]. You can also message me directly via MyChart.   Sincerely,  Smitty Cords, MD

## 2017-09-23 NOTE — Telephone Encounter (Signed)
Compliance range is 09/02/17 to 12/02/2017. Patient has a 10 week sleep f/u appt. for November 25 2017. Patient understands she needs to keep this appt, for insurance compliance.

## 2017-09-23 NOTE — Assessment & Plan Note (Addendum)
Multifactorial given hx of HFrEF 35-40% and venous stasis. Has been on Torsemide 40 mg BID since 09/13/17 (about 10 days). Her weight on our scale today is 320. Her weight at home today was reported to be 315 which is not too far off from what she has been most recently. Dry weight is supposed to be around 305 lbs. No signs of CHF exacerbation with clear lungs, normal work of breathing, 99% O2 on room air. Edema is slightly more than baseline. Left leg blisters from 09/13/17 have resolved. The plan is as follows: - Increase Torsemide to 60 mg in morning and continue 40 mg in afternoon - Discussed that it would be best to follow up with cardiology since they were titrating her diuretics previously but we would be happy to see her if she is unable to get an appt - Will see if she can follow up with cardiology this week, if not, she will come back to our clinic - Will need BMP at follow up

## 2017-09-23 NOTE — Assessment & Plan Note (Signed)
>>  ASSESSMENT AND PLAN FOR BILATERAL LEG EDEMA WRITTEN ON 09/23/2017  3:32 PM BY Beaulah Dinning, MD  Multifactorial given hx of HFrEF 35-40% and venous stasis. Has been on Torsemide 40 mg BID since 09/13/17 (about 10 days). Her weight on our scale today is 320. Her weight at home today was reported to be 315 which is not too far off from what she has been most recently. Dry weight is supposed to be around 305 lbs. No signs of CHF exacerbation with clear lungs, normal work of breathing, 99% O2 on room air. Edema is slightly more than baseline. Left leg blisters from 09/13/17 have resolved. The plan is as follows: - Increase Torsemide to 60 mg in morning and continue 40 mg in afternoon - Discussed that it would be best to follow up with cardiology since they were titrating her diuretics previously but we would be happy to see her if she is unable to get an appt - Will see if she can follow up with cardiology this week, if not, she will come back to our clinic - Will need BMP at follow up

## 2017-09-24 ENCOUNTER — Ambulatory Visit: Payer: Medicare Other | Admitting: Physician Assistant

## 2017-09-24 ENCOUNTER — Encounter: Payer: Self-pay | Admitting: Physician Assistant

## 2017-09-24 ENCOUNTER — Telehealth: Payer: Self-pay

## 2017-09-24 VITALS — BP 122/70 | HR 72 | Ht 69.0 in | Wt 314.0 lb

## 2017-09-24 DIAGNOSIS — N183 Chronic kidney disease, stage 3 unspecified: Secondary | ICD-10-CM

## 2017-09-24 DIAGNOSIS — I481 Persistent atrial fibrillation: Secondary | ICD-10-CM | POA: Diagnosis not present

## 2017-09-24 DIAGNOSIS — I10 Essential (primary) hypertension: Secondary | ICD-10-CM | POA: Diagnosis not present

## 2017-09-24 DIAGNOSIS — I5023 Acute on chronic systolic (congestive) heart failure: Secondary | ICD-10-CM

## 2017-09-24 DIAGNOSIS — I4819 Other persistent atrial fibrillation: Secondary | ICD-10-CM

## 2017-09-24 DIAGNOSIS — I25810 Atherosclerosis of coronary artery bypass graft(s) without angina pectoris: Secondary | ICD-10-CM

## 2017-09-24 LAB — BASIC METABOLIC PANEL
BUN / CREAT RATIO: 27 (ref 12–28)
BUN: 45 mg/dL — ABNORMAL HIGH (ref 8–27)
CHLORIDE: 98 mmol/L (ref 96–106)
CO2: 24 mmol/L (ref 20–29)
Calcium: 9.5 mg/dL (ref 8.7–10.3)
Creatinine, Ser: 1.68 mg/dL — ABNORMAL HIGH (ref 0.57–1.00)
GFR calc non Af Amer: 29 mL/min/{1.73_m2} — ABNORMAL LOW (ref >59)
GFR, EST AFRICAN AMERICAN: 33 mL/min/{1.73_m2} — AB (ref >59)
Glucose: 94 mg/dL (ref 65–99)
POTASSIUM: 4.4 mmol/L (ref 3.5–5.2)
SODIUM: 143 mmol/L (ref 134–144)

## 2017-09-24 MED ORDER — TORSEMIDE 20 MG PO TABS: ORAL_TABLET | ORAL | 0 refills | Status: DC

## 2017-09-24 NOTE — Telephone Encounter (Signed)
The pt brought in her [place] pt assistance application for Eliquis. She did not include her proof of income or her out of pocket expense report from her pharmacy.  While here at an OV with Ermalinda Barrios, PA-c this morning I advised the pt and her son that we need those documents before I fax her application in. The pts son states that he will get the expense report from the pts local pharmacy and her proof of income and bring them to the office so we can fax the application in its entirety to [employer].  I have placed both the providers and the pts part of the application in the yellow folder awaiting documents from the pt.Marland Kitchen

## 2017-09-24 NOTE — Progress Notes (Signed)
Cardiology Office Note    Date:  09/24/2017   ID:  [name], [name] 12/06/1940, MRN [id number]  PCP:  Carlyle Dolly, MD  Cardiologist: Dr. Angelena Form  Chief Complaint  Patient presents with  . Shortness of Breath    History of Present Illness:  [name] is a 76 y.o. female with history of CAD status post CABG 2009, ischemic cardiomyopathy with chronic systolic CHF, persistent atrial fibrillation with stroke in 03/2016 when INR was subtherapeutic, stage III CKD, morbid obesity, pulmonary hypertension, diabetes, GI bleed 03/2017 for severe sleep apnea.  She takes Eliquis.  History of dizziness with benazepril and spironolactone and prior hypotension with the angiotensin receptor blocker.  Last 2D echo 03/30/16 LVEF 35-40% with diffuse hypokinesis and high ventricular filling pressures, with moderate TR.  Patient saw Richardson Dopp, PA-C 09/10/17 with acute on chronic CHF.  Her weight was up to 312 pounds.  Dry weight is closer to 300 pounds.  Lasix was increased to 60 mg twice daily.  She saw PCP 09/13/17 and Lasix was changed to torsemide 40 mg twice daily for 2-3 days and then once daily but she continued to take it twice daily.  Yesterday she saw Dr. Juanito Doom and was drinking about 64 ounces per day her weight was up to 320 pounds on Dr. scales 315 on her scales.  Torsemide was increased to 60 mg in the morning 40 in the afternoon.  Creatinine was 1.50 on 09/10/17 and 1.74 in 09/13/17.  Patient comes in today accompanied by her son.  He thinks she was drinking too many sodas and that is what contributed to the weight gain and heart failure.  She is limiting her sodas to 1 a day.  She weighs 314 pounds on our scales 312 pounds on her scales which is down 3 pounds from yesterday.  Her edema is about the same she gets short of breath with walking very short distance.  She denies any chest pain or rapid heartbeats.   Past Medical History:  Diagnosis Date  . ACC/AHA stage C congestive  heart failure due to ischemic cardiomyopathy ([place]) 01/31/2009   Qualifier: Diagnosis of  By: Olevia Perches, MD, Glenetta Hew   . ANXIETY 01/31/2009   Qualifier: Diagnosis of  By: Lovette Cliche, CNA, Christy    . Arthritis    "qwhere" (03/30/2016)  . Benign essential HTN   . CAD (coronary artery disease) 05/2008   a. s/p CABG in 2009.  Marland Kitchen Cerebrovascular accident (CVA) due to embolism of right middle cerebral artery ([place])   . Chronic anticoagulation   . Chronic kidney disease (CKD), stage III (moderate) (HCC)   . Chronic lower back pain   . Chronic pain syndrome   . Chronic systolic CHF (congestive heart failure) ([place])   . DM type 2 with diabetic peripheral neuropathy ([place])   . Facial weakness, post-stroke   . Gastrointestinal hemorrhage 03/26/2017  . Hemiparesis ([place])   . History of CVA with residual deficit 03/26/2017  . Hyperlipidemia   . Hypertension   . Ischemic cardiomyopathy   . Longstanding persistent atrial fibrillation ([place])    a. Postoperative in 2009;  S/P transesophageal echocardiography-guided cardioversion, previously on Amiodarone and Coumadin. b. recurrent in April 2013 -> pt elected rate control strategy, initially Coumadin -> had stroke with subtherapeutic INR in 03/2016 and transitioned to Eliquis.  . Morbid obesity ([place]) 03/26/2017  . Persistent atrial fibrillation ([place])   . Thrombocytopenia ([place])     Past Surgical History:  Procedure Laterality Date  . APPENDECTOMY  1946  . CATARACT EXTRACTION Right 2012  . CORONARY ANGIOPLASTY WITH STENT PLACEMENT  2009   "put 2 stents in"  . HERNIA REPAIR    . JOINT REPLACEMENT    . TEE WITH CARDIOVERSION     Postoperative in 2009;  S/P transesophageal echocardiography-guided cardioversion,  . TOTAL KNEE ARTHROPLASTY Right 1994  . VENTRAL HERNIA REPAIR  10/1999   With mesh    Current Medications: Current Meds  Medication Sig  . apixaban (ELIQUIS) 5 MG TABS tablet Take 1 tablet (5 mg total) by mouth 2 (two) times daily.  Marland Kitchen  atorvastatin (LIPITOR) 40 MG tablet Take 40 mg by mouth daily at 6 PM.  . carvedilol (COREG) 6.25 MG tablet Take 1 tablet (6.25 mg total) by mouth 2 (two) times daily with a meal.  . cholecalciferol (VITAMIN D) 1000 UNITS tablet Take 1,000 Units by mouth 2 (two) times daily.  Marland Kitchen docusate sodium (COLACE) 100 MG capsule Take 200 mg by mouth at bedtime.   . gabapentin (NEURONTIN) 400 MG capsule Take 400 mg by mouth 2 (two) times daily.  . insulin glargine (LANTUS) 100 UNIT/ML injection Inject 23 Units into the skin at bedtime.  . nitroGLYCERIN (NITROSTAT) 0.4 MG SL tablet Place 0.4 mg under the tongue every 5 (five) minutes as needed for chest pain (up to 3 doses).   . potassium chloride SA (K-DUR,KLOR-CON) 20 MEQ tablet Take 1 tablet (20 mEq total) by mouth daily.  Marland Kitchen rOPINIRole (REQUIP) 0.5 MG tablet Take 2 tablets (1 mg total) by mouth at bedtime.  . torsemide (DEMADEX) 20 MG tablet Take 20 mg as directed by mouth. Pt takes 60 mg in the morning and 40 mg in the evening  . traMADol (ULTRAM) 50 MG tablet Take 1-2 tablets (50-100 mg total) See admin instructions by mouth. Take 2 tablets (100 mg) by mouth every morning and 1 tablet (50 mg) at night  . vitamin B-12 (CYANOCOBALAMIN) 1000 MCG tablet Take 1,000 mcg by mouth daily.  . [DISCONTINUED] torsemide (DEMADEX) 20 MG tablet Take 40 mg (2 tablets) in the morning and in the afternoon until you are close your dry weight (305 lbs). Then, go back to 40 mg daily.     Allergies:   Benazepril; Angiotensin receptor blockers; Fe-succ-c-thre-b12-des stomach; Spironolactone; and Sulfamethoxazole-trimethoprim   Social History   Socioeconomic History  . Marital status: Widowed    Spouse name: None  . Number of children: None  . Years of education: None  . Highest education level: None  Social Needs  . Financial resource strain: None  . Food insecurity - worry: None  . Food insecurity - inability: None  . Transportation needs - medical: None  .  Transportation needs - non-medical: None  Occupational History  . None  Tobacco Use  . Smoking status: Never Smoker  . Smokeless tobacco: Never Used  Substance and Sexual Activity  . Alcohol use: No  . Drug use: No  . Sexual activity: Not Currently  Other Topics Concern  . None  Social History Narrative   Widowed   Lives alone in [place]   2 children   Not routinely exercising     Family History:  The patient's family history includes Clotting disorder in her mother; Emphysema in her father; Lung cancer in her brother.   ROS:   Please see the history of present illness.    Review of Systems  Constitution: Positive for weakness, malaise/fatigue and weight gain.  HENT: Negative.   Eyes: Negative.   Cardiovascular: Positive for dyspnea on exertion, irregular heartbeat and leg swelling.  Respiratory: Positive for shortness of breath and sleep disturbances due to breathing.   Hematologic/Lymphatic: Negative.   Musculoskeletal: Positive for arthritis. Negative for joint pain.  Gastrointestinal: Negative.   Genitourinary: Negative.    All other systems reviewed and are negative.   PHYSICAL EXAM:   VS:  BP 122/70   Pulse 72   Ht 5\' 9"  (1.753 m)   Wt (!) 314 lb (142.4 kg)   LMP  (LMP Unknown)   SpO2 99%   BMI 46.37 kg/m   Physical Exam  GEN: Obese, in no acute distress  Neck: increase JVD, no carotid bruits, or masses Cardiac: Irregular irregular at 70 bpm with 2/6 systolic murmur at left sternal border Respiratory:  clear to auscultation bilaterally, normal work of breathing GI: soft, nontender, nondistended, + BS Ext: 4+ edema bilaterally Neuro:  Alert and Oriented x 3 Psych: euthymic mood, full affect  Wt Readings from Last 3 Encounters:  09/24/17 (!) 314 lb (142.4 kg)  09/23/17 (!) 320 lb (145.2 kg)  09/13/17 (!) 314 lb 3.2 oz (142.5 kg)      Studies/Labs Reviewed:   EKG:  EKG is not ordered today.    Recent Labs: 03/25/2017: ALT 14; B Natriuretic Peptide  149.9 07/19/2017: Hemoglobin 10.1; Platelets 113 09/13/2017: BUN 43; Creatinine, Ser 1.74; Potassium 4.3; Sodium 141   Lipid Panel    Component Value Date/Time   CHOL 102 03/30/2016 0740   TRIG 50 03/30/2016 0740   HDL 39 (L) 03/30/2016 0740   CHOLHDL 2.6 03/30/2016 0740   VLDL 10 03/30/2016 0740   LDLCALC 53 03/30/2016 0740    Additional studies/ records that were reviewed today include:  2D echo 03/30/16  Study Conclusions   - Left ventricle: The cavity size was normal. Wall thickness was   normal. Systolic function was moderately reduced. The estimated   ejection fraction was in the range of 35% to 40%. Diffuse   hypokinesis. Doppler parameters are consistent with high   ventricular filling pressure. - Aortic valve: There was trivial regurgitation. - Mitral valve: Calcified annulus. There was mild regurgitation. - Left atrium: The atrium was mildly dilated. - Right atrium: The atrium was mildly dilated. - Tricuspid valve: There was moderate regurgitation. - Pulmonary arteries: Systolic pressure was moderately increased.   PA peak pressure: 49 mm Hg (S).   Impressions:   - Technically difficult; definity used; moderate global LV   dysfunction (EF 49); trace AI; mild MR; mild biatrial   enlargement; moderate TR; moderately elevated pulmonary pressure.     ASSESSMENT:    1. Acute on chronic systolic CHF (congestive heart failure) ([place])   2. Coronary artery disease involving coronary bypass graft of native heart without angina pectoris   3. Benign essential HTN   4. Persistent atrial fibrillation (HCC)   5. CKD (chronic kidney disease) stage 3, GFR 30-59 ml/min (HCC)   6. Morbid obesity ([place])      PLAN:  In order of problems listed above:  Acute on chronic systolic CHF ejection fraction 35-40%.  This actually seems to be more right-sided heart failure as her lungs are clear.  She has lost 3 pounds since her torsemide was increased but she is still about 14 pounds  above her dry weight.  She has multiple drug intolerances but I wonder if she would benefit from [place].  Last echo was approximately 18 months ago.  We will repeat to reassess LV and RV function.  Refer to advanced heart failure clinic.  Increase torsemide to 60 mg twice daily for 4 days then back to 60 mg in the morning 40 in the afternoon.  Bmet today.  Follow-up with Dr. Angelena Form next available.  CAD status post CABG in 2009 without anginal symptoms  Benign essential hypertension controlled  Chronic atrial fibrillation on Eliquis and Coreg with good rate control  CKD stage III check renal function today  Morbid obesity  Medication Adjustments/Labs and Tests Ordered: Current medicines are reviewed at length with the patient today.  Concerns regarding medicines are outlined above.  Medication changes, Labs and Tests ordered today are listed in the Patient Instructions below. Patient Instructions  Medication Instructions:. Your physician has recommended you make the following change in your medication:  -1) INCREASE Torsemide (Demadex) 60 mg - Take 3 tablets (60 mg) by mouth twice daily for 4 days, then resume 60 mg in the AM and 40 mg in the PM  Labwork: Your physician has recommended that you have lab work today: BMET   Procedures/Testing: Your physician has requested that you have an echocardiogram. Echocardiography is a painless test that uses sound waves to create images of your heart. It provides your doctor with information about the size and shape of your heart and how well your heart's chambers and valves are working. This procedure takes approximately one hour. There are no restrictions for this procedure.  Follow-Up: Your physician recommends that you schedule a follow-up appointment 1st available with Dr. Shirley Friar have been referred to Heart Failure Clinic  If you need a refill on your cardiac medications before your next appointment, please call your pharmacy.       Sumner Boast, PA-C  09/24/2017 8:33 AM    [facility] [address], [place], [place]  [address] Phone: [phone]; Fax: [phone]

## 2017-09-24 NOTE — Patient Instructions (Addendum)
Medication Instructions:. Your physician has recommended you make the following change in your medication:  -1) INCREASE Torsemide (Demadex) 60 mg - Take 3 tablets (60 mg) by mouth twice daily for 4 days, then resume 60 mg in the AM and 40 mg in the PM  Labwork: Your physician has recommended that you have lab work today: BMET   Procedures/Testing: Your physician has requested that you have an echocardiogram. Echocardiography is a painless test that uses sound waves to create images of your heart. It provides your doctor with information about the size and shape of your heart and how well your heart's chambers and valves are working. This procedure takes approximately one hour. There are no restrictions for this procedure.  Follow-Up: Your physician recommends that you schedule a follow-up appointment 1st available with Dr. Shirley Friar have been referred to Heart Failure Clinic  If you need a refill on your cardiac medications before your next appointment, please call your pharmacy.

## 2017-09-25 ENCOUNTER — Other Ambulatory Visit: Payer: Medicare Other

## 2017-09-30 ENCOUNTER — Other Ambulatory Visit: Payer: Self-pay

## 2017-09-30 ENCOUNTER — Ambulatory Visit: Payer: Medicare Other | Admitting: Family Medicine

## 2017-09-30 ENCOUNTER — Ambulatory Visit (HOSPITAL_COMMUNITY): Payer: Medicare Other | Attending: Cardiology

## 2017-09-30 ENCOUNTER — Telehealth: Payer: Self-pay | Admitting: Cardiovascular Disease

## 2017-09-30 DIAGNOSIS — I5023 Acute on chronic systolic (congestive) heart failure: Secondary | ICD-10-CM

## 2017-09-30 DIAGNOSIS — I420 Dilated cardiomyopathy: Secondary | ICD-10-CM | POA: Diagnosis not present

## 2017-09-30 DIAGNOSIS — I081 Rheumatic disorders of both mitral and tricuspid valves: Secondary | ICD-10-CM | POA: Insufficient documentation

## 2017-09-30 NOTE — Telephone Encounter (Signed)
[name] Baller  RN Case Manger [facility]) is calling because [name] states that she is having worse shortness of breath w/ exertion for a few days . Has been reporting it for the last 3 days . Swelling as gotten better in her legs and has lost 7.8 pounds in the last 7 days . Was here this morning echo.   Please call the patient when returning the call . Thanks ...... Please call [name] Baller RN  Case Manager with the EF from the echo or fax it to her at fax#[phone]. Thank s

## 2017-09-30 NOTE — Telephone Encounter (Signed)
I spoke with pt. She reports when case manager called to check on her she hadn't been telling her about being short of breath. Her son told her she needed to make case manager aware so pt reported this information when speaking with case manager today. Pt reports shortness of breath has actually improved since office visit on 09/24/17. Swelling has improved some.  She does get short of breath when walking to the car but she thinks this is because she was hurrying.  Son told her today he thinks she is doing better as she was able to get out of the car without problems. Recent weights- 11/14-312 lbs 11/15-314 lbs 11/16-310 lbs 11/17- 308 lbs 11/18-307 lbs 11/19-307 lbss  Torsemide was recently increased to 60 mg twice daily for 4 days (until this past Friday) and then back to 60 mg in the AM and 40 mg in the PM.  She is seeing CHF clinic on 10/09/17   Since pt's shortness of breath has improved since last office visit I told her to continue current dose torsemide and continue to weigh daily.  I told her to let us know if shortness of breath worsens or weight begins to increase.

## 2017-10-01 NOTE — Telephone Encounter (Signed)
AGree. Marina Gravel

## 2017-10-04 DIAGNOSIS — G4733 Obstructive sleep apnea (adult) (pediatric): Secondary | ICD-10-CM | POA: Diagnosis not present

## 2017-10-07 ENCOUNTER — Ambulatory Visit: Payer: Medicare Other | Admitting: Physician Assistant

## 2017-10-07 ENCOUNTER — Telehealth: Payer: Self-pay | Admitting: Cardiovascular Disease

## 2017-10-07 ENCOUNTER — Encounter (HOSPITAL_BASED_OUTPATIENT_CLINIC_OR_DEPARTMENT_OTHER): Payer: Medicare Other

## 2017-10-07 NOTE — Telephone Encounter (Signed)
I agree. Thanks.

## 2017-10-07 NOTE — Telephone Encounter (Signed)
I spoke with pt. She reports she just ran out of Eliquis today.  Can't afford to get it refilled right now.  I told her I would leave samples at the front desk for her.  Her son will stop by today to pick them up.

## 2017-10-07 NOTE — Telephone Encounter (Signed)
I spoke with pt. She reports quarter sized blister on inside of right lower leg. Noticed on Friday evening. Also has small bubbles on left lower leg. Weights as follows- 11/26-314 lb 11/25- 313 lbs 11/24-313 lbs 11/23-312 lbs 11/22-310 lbs 11/21-309 lbs  Watched diet carefully over Thanksgiving. Shortness of breath has not increased.  Only notices when getting in and out of the car. Current dose of torsemide is 60 mg in the AM and 40 mg in the PM.  Had previously been increased to 60 mg twice daily for 4 days. She is seeing CHF clinic on 11/28.  I instructed pt to increase torsemide to 60 mg twice daily until seen on 11/28.  I told her I would make Dr. Angelena Form aware and call her back if he had other recommendations.

## 2017-10-07 NOTE — Telephone Encounter (Signed)
Mrs.Tigue is calling to speak with you about her medication and blisters on her leg . Please call .Marland Kitchen Thanks

## 2017-10-07 NOTE — Telephone Encounter (Signed)
New Message  Pt call requesting to speak with RN. Pt states he blisters are returning on her legs and would like to discuss with RN. Please call back

## 2017-10-09 ENCOUNTER — Encounter (HOSPITAL_COMMUNITY): Payer: Self-pay

## 2017-10-09 ENCOUNTER — Ambulatory Visit (HOSPITAL_COMMUNITY)
Admission: RE | Admit: 2017-10-09 | Discharge: 2017-10-09 | Disposition: A | Discharge status: Home / Self Care | Payer: Medicare Other | Source: Ambulatory Visit | Attending: Internal Medicine | Admitting: Internal Medicine

## 2017-10-09 VITALS — BP 126/62 | HR 78 | Wt 313.0 lb

## 2017-10-09 DIAGNOSIS — I4819 Other persistent atrial fibrillation: Secondary | ICD-10-CM

## 2017-10-09 DIAGNOSIS — Z8673 Personal history of transient ischemic attack (TIA), and cerebral infarction without residual deficits: Secondary | ICD-10-CM | POA: Insufficient documentation

## 2017-10-09 DIAGNOSIS — Z9889 Other specified postprocedural states: Secondary | ICD-10-CM | POA: Insufficient documentation

## 2017-10-09 DIAGNOSIS — Z79899 Other long term (current) drug therapy: Secondary | ICD-10-CM | POA: Diagnosis not present

## 2017-10-09 DIAGNOSIS — G4730 Sleep apnea, unspecified: Secondary | ICD-10-CM | POA: Insufficient documentation

## 2017-10-09 DIAGNOSIS — E1142 Type 2 diabetes mellitus with diabetic polyneuropathy: Secondary | ICD-10-CM | POA: Insufficient documentation

## 2017-10-09 DIAGNOSIS — I255 Ischemic cardiomyopathy: Secondary | ICD-10-CM | POA: Insufficient documentation

## 2017-10-09 DIAGNOSIS — I25810 Atherosclerosis of coronary artery bypass graft(s) without angina pectoris: Secondary | ICD-10-CM | POA: Diagnosis not present

## 2017-10-09 DIAGNOSIS — I10 Essential (primary) hypertension: Secondary | ICD-10-CM | POA: Diagnosis not present

## 2017-10-09 DIAGNOSIS — Z7901 Long term (current) use of anticoagulants: Secondary | ICD-10-CM | POA: Insufficient documentation

## 2017-10-09 DIAGNOSIS — Z882 Allergy status to sulfonamides status: Secondary | ICD-10-CM | POA: Insufficient documentation

## 2017-10-09 DIAGNOSIS — E1122 Type 2 diabetes mellitus with diabetic chronic kidney disease: Secondary | ICD-10-CM | POA: Insufficient documentation

## 2017-10-09 DIAGNOSIS — I2510 Atherosclerotic heart disease of native coronary artery without angina pectoris: Secondary | ICD-10-CM | POA: Diagnosis not present

## 2017-10-09 DIAGNOSIS — N183 Chronic kidney disease, stage 3 unspecified: Secondary | ICD-10-CM

## 2017-10-09 DIAGNOSIS — I130 Hypertensive heart and chronic kidney disease with heart failure and stage 1 through stage 4 chronic kidney disease, or unspecified chronic kidney disease: Secondary | ICD-10-CM | POA: Diagnosis not present

## 2017-10-09 DIAGNOSIS — X58XXXA Exposure to other specified factors, initial encounter: Secondary | ICD-10-CM | POA: Insufficient documentation

## 2017-10-09 DIAGNOSIS — I878 Other specified disorders of veins: Secondary | ICD-10-CM | POA: Diagnosis not present

## 2017-10-09 DIAGNOSIS — I481 Persistent atrial fibrillation: Secondary | ICD-10-CM | POA: Diagnosis not present

## 2017-10-09 DIAGNOSIS — R600 Localized edema: Secondary | ICD-10-CM

## 2017-10-09 DIAGNOSIS — F419 Anxiety disorder, unspecified: Secondary | ICD-10-CM | POA: Insufficient documentation

## 2017-10-09 DIAGNOSIS — Z794 Long term (current) use of insulin: Secondary | ICD-10-CM | POA: Insufficient documentation

## 2017-10-09 DIAGNOSIS — I5022 Chronic systolic (congestive) heart failure: Secondary | ICD-10-CM | POA: Insufficient documentation

## 2017-10-09 DIAGNOSIS — E785 Hyperlipidemia, unspecified: Secondary | ICD-10-CM | POA: Insufficient documentation

## 2017-10-09 DIAGNOSIS — Z951 Presence of aortocoronary bypass graft: Secondary | ICD-10-CM | POA: Insufficient documentation

## 2017-10-09 DIAGNOSIS — S81801A Unspecified open wound, right lower leg, initial encounter: Secondary | ICD-10-CM | POA: Insufficient documentation

## 2017-10-09 LAB — BASIC METABOLIC PANEL
ANION GAP: 9 (ref 5–15)
BUN: 40 mg/dL — ABNORMAL HIGH (ref 6–20)
CALCIUM: 9.3 mg/dL (ref 8.9–10.3)
CO2: 29 mmol/L (ref 22–32)
CREATININE: 1.78 mg/dL — AB (ref 0.44–1.00)
Chloride: 101 mmol/L (ref 101–111)
GFR, EST AFRICAN AMERICAN: 30 mL/min — AB (ref >60)
GFR, EST NON AFRICAN AMERICAN: 26 mL/min — AB (ref >60)
Glucose, Bld: 99 mg/dL (ref 65–99)
Potassium: 3.4 mmol/L — ABNORMAL LOW (ref 3.5–5.1)
SODIUM: 139 mmol/L (ref 135–145)

## 2017-10-09 MED ORDER — METOLAZONE 2.5 MG PO TABS: 2.5000 mg | ORAL_TABLET | Freq: Every day | ORAL | 0 refills | Status: DC | PRN

## 2017-10-09 NOTE — Patient Instructions (Addendum)
Routine lab work today. Will notify you of abnormal results, otherwise no news is good news!  Take metolazone 2.5 mg tablet once daily for TWO DAYS ONLY. Take EXTRA 20 meq potassium tablet once daily with this medication.  Will refer to [facility] for home health unna boot leg wraps and wound care.  Follow up 2 weeks with Oda Kilts PA-C.  Take all medication as prescribed the day of your appointment. Bring all medications with you to your appointment.  Do the following things EVERYDAY: 1) Weigh yourself in the morning before breakfast. Write it down and keep it in a log. 2) Take your medicines as prescribed 3) Eat low salt foods-Limit salt (sodium) to 2000 mg per day.  4) Stay as active as you can everyday 5) Limit all fluids for the day to less than 2 liters

## 2017-10-09 NOTE — Progress Notes (Signed)
Advanced Heart Failure Medication Review by a Pharmacist  Does the patient  feel that his/her medications are working for him/her?  yes  Has the patient been experiencing any side effects to the medications prescribed?  no  Does the patient measure his/her own blood pressure or blood glucose at home?  Not asked at this visit     Does the patient have any problems obtaining medications due to transportation or finances?   Yes - currently in the donut hole but has been able to get samples   Understanding of regimen: excellent Understanding of indications: good Potential of compliance: excellent Patient understands to avoid NSAIDs. Patient understands to avoid decongestants.  Issues to address at subsequent visits: none    Pharmacist comments: [name] presents to clinic today with her medication list. She took her doses of medication today and reports adherence with her current regimen. She had no medication related questions at this time.     Time with patient: 10 Preparation and documentation time: 2 Total time: 12

## 2017-10-09 NOTE — Progress Notes (Signed)
Advanced Heart Failure Clinic Note   Referring Physician: Dr. Angelena Form Primary Care: Dr. Juanito Doom Primary Cardiologist: Dr. Angelena Form HF: New  HPI:  [name] is a 76 y.o. female [name] is a 76 y.o. female with history of CAD status post CABG 2009, ischemic cardiomyopathy with chronic systolic CHF, persistent atrial fibrillation with stroke in 03/2016 when INR was subtherapeutic, stage III CKD, morbid obesity, pulmonary hypertension, diabetes, GI bleed 03/2017, and severe sleep apnea.   Seen in [facility] clinics 10/30 and 11/13, both times with volume overload and marked peripheral edema with skin breakdown. Torsemide increased at each visit.   Pt presents today to establish in the CHF clinic for severe RV failure. Has been struggling with ongoing volume overload and severe peripheral edema over the past month as above. No single trigger has been identified. She drinks > 2L most days, but states her son is very strict about her salt intake. She has a large blister on her R ankle. She has a large scar on her left ankle from a previous blister last year. Treated with ABX at that time and was "popped" at an urgent care. She has chronic orthopnea and sleeps in a recliner/lift chair. She wears her CPAP nightly. She is not very mobile. She denies SOB with ADLs, but it has been harder to get around with her marked edema. Weight at home 312. Baseline last month was around 300 lbs.   Echo 09/30/2017 LVEF 35-40%, severe RV dilation, severe RAE, Severe TR, PA peak pressure.   Review of Systems: [y] = yes, [ ]  = no   General: Weight gain [y]; Weight loss [ ] ; Anorexia [ ] ; Fatigue [y]; Fever [ ] ; Chills [ ] ; Weakness [y]  Cardiac: Chest pain/pressure [ ] ; Resting SOB [ ] ; Exertional SOB [y]; Orthopnea [y]; Pedal Edema [y]; Palpitations [ ] ; Syncope [ ] ; Presyncope [ ] ; Paroxysmal nocturnal dyspnea[ ]   Pulmonary: Cough [y]; Wheezing[ ] ; Hemoptysis[ ] ; Sputum [ ] ; Snoring [ ]   GI: Vomiting[ ] ;  Dysphagia[ ] ; Melena[ ] ; Hematochezia [ ] ; Heartburn[ ] ; Abdominal pain [ ] ; Constipation [ ] ; Diarrhea [ ] ; BRBPR [ ]   GU: Hematuria[ ] ; Dysuria [ ] ; Nocturia[ ]   Vascular: Pain in legs with walking [ ] ; Pain in feet with lying flat [ ] ; Non-healing sores [ ] ; Stroke [ ] ; TIA [ ] ; Slurred speech [ ] ;  Neuro: Headaches[ ] ; Vertigo[ ] ; Seizures[ ] ; Paresthesias[ ] ;Blurred vision [ ] ; Diplopia [ ] ; Vision changes [ ]   Ortho/Skin: Arthritis [y]; Joint pain [y]; Muscle pain [ ] ; Joint swelling [ ] ; Back Pain [ ] ; Rash [ ]   Psych: Depression[ ] ; Anxiety[ ]   Heme: Bleeding problems [ ] ; Clotting disorders [ ] ; Anemia [ ]   Endocrine: Diabetes [y]; Thyroid dysfunction[ ]   Past Medical History:  Diagnosis Date  . ACC/AHA stage C congestive heart failure due to ischemic cardiomyopathy ([place]) 01/31/2009   Qualifier: Diagnosis of  By: Olevia Perches, MD, Glenetta Hew   . ANXIETY 01/31/2009   Qualifier: Diagnosis of  By: Lovette Cliche, CNA, Christy    . Arthritis    "qwhere" (03/30/2016)  . Benign essential HTN   . CAD (coronary artery disease) 05/2008   a. s/p CABG in 2009.  Marland Kitchen Cerebrovascular accident (CVA) due to embolism of right middle cerebral artery ([place])   . Chronic anticoagulation   . Chronic kidney disease (CKD), stage III (moderate) (HCC)   . Chronic lower back pain   . Chronic pain syndrome   .  Chronic systolic CHF (congestive heart failure) ([place])   . DM type 2 with diabetic peripheral neuropathy ([place])   . Facial weakness, post-stroke   . Gastrointestinal hemorrhage 03/26/2017  . Hemiparesis ([place])   . History of CVA with residual deficit 03/26/2017  . Hyperlipidemia   . Hypertension   . Ischemic cardiomyopathy   . Longstanding persistent atrial fibrillation ([place])    a. Postoperative in 2009;  S/P transesophageal echocardiography-guided cardioversion, previously on Amiodarone and Coumadin. b. recurrent in April 2013 -> pt elected rate control strategy, initially Coumadin -> had stroke with  subtherapeutic INR in 03/2016 and transitioned to Eliquis.  . Morbid obesity ([place]) 03/26/2017  . Persistent atrial fibrillation ([place])   . Thrombocytopenia ([place])     Current Outpatient Medications  Medication Sig Dispense Refill  . apixaban (ELIQUIS) 5 MG TABS tablet Take 1 tablet (5 mg total) by mouth 2 (two) times daily. 180 tablet 3  . atorvastatin (LIPITOR) 40 MG tablet Take 40 mg by mouth daily at 6 PM.    . carvedilol (COREG) 6.25 MG tablet Take 1 tablet (6.25 mg total) by mouth 2 (two) times daily with a meal. 60 tablet 6  . cholecalciferol (VITAMIN D) 1000 UNITS tablet Take 1,000 Units by mouth 2 (two) times daily.    Marland Kitchen docusate sodium (COLACE) 100 MG capsule Take 200 mg by mouth at bedtime.     . gabapentin (NEURONTIN) 400 MG capsule Take 400 mg by mouth 2 (two) times daily.    . insulin glargine (LANTUS) 100 UNIT/ML injection Inject 23 Units into the skin at bedtime.    . potassium chloride SA (K-DUR,KLOR-CON) 20 MEQ tablet Take 1 tablet (20 mEq total) by mouth daily. 30 tablet 1  . rOPINIRole (REQUIP) 0.5 MG tablet Take 2 tablets (1 mg total) by mouth at bedtime. 30 tablet 1  . torsemide (DEMADEX) 20 MG tablet Take 60 mg by mouth 2 (two) times daily.    . traMADol (ULTRAM) 50 MG tablet Take 1-2 tablets (50-100 mg total) See admin instructions by mouth. Take 2 tablets (100 mg) by mouth every morning and 1 tablet (50 mg) at night 40 tablet 0  . vitamin B-12 (CYANOCOBALAMIN) 1000 MCG tablet Take 1,000 mcg by mouth daily.    . nitroGLYCERIN (NITROSTAT) 0.4 MG SL tablet Place 0.4 mg under the tongue every 5 (five) minutes as needed for chest pain (up to 3 doses).      No current facility-administered medications for this encounter.    Allergies  Allergen Reactions  . Benazepril Other (See Comments)    Unknown reaction at age 34-65 - possibly dizziness  . Angiotensin Receptor Blockers Other (See Comments)    Hypotension reaction  . Fe-Succ-C-Thre-B12-Des Stomach Other (See Comments)      Unknown reaction  . Spironolactone Other (See Comments)    Dizziness   . Sulfamethoxazole-Trimethoprim Other (See Comments)    Unknown reaction   Social History   Socioeconomic History  . Marital status: Widowed    Spouse name: Not on file  . Number of children: Not on file  . Years of education: Not on file  . Highest education level: Not on file  Social Needs  . Financial resource strain: Not on file  . Food insecurity - worry: Not on file  . Food insecurity - inability: Not on file  . Transportation needs - medical: Not on file  . Transportation needs - non-medical: Not on file  Occupational History  . Not on file  Tobacco Use  . Smoking status: Never Smoker  . Smokeless tobacco: Never Used  Substance and Sexual Activity  . Alcohol use: No  . Drug use: No  . Sexual activity: Not Currently  Other Topics Concern  . Not on file  Social History Narrative   Widowed   Lives alone in [place]   2 children   Not routinely exercising   Family History  Problem Relation Age of Onset  . Clotting disorder Mother        Cerebral hemorrhage  . Emphysema Father        COD  . Lung cancer Brother   . Heart disease Neg Hx    Vitals:   10/09/17 1420  BP: 126/62  Pulse: 78  SpO2: 98%  Weight: (!) 313 lb (142 kg)   Wt Readings from Last 3 Encounters:  10/09/17 (!) 313 lb (142 kg)  09/24/17 (!) 314 lb (142.4 kg)  09/23/17 (!) 320 lb (145.2 kg)    PHYSICAL EXAM: General:  Elderly appearing. Ambulated into clinic with rollator. No respiratory difficulty HEENT: Normal Neck: supple. JVP difficult to assess due to body habitus, but appears elevated. Carotids 2+ bilat; no bruits. No lymphadenopathy or thyromegaly appreciated. Cor: PMI nondisplaced. Irregularly irregular.  Lungs: Distant, mildly diminished basilar sounds.  Abdomen: Morbidly obese, soft, nontender, nondistended. No hepatosplenomegaly. No bruits or masses. Good bowel sounds. Extremities: no cyanosis or  clubbing.  Neuro: alert & oriented x 3, cranial nerves grossly intact. moves all 4 extremities w/o difficulty. Affect pleasant.  ASSESSMENT & PLAN:  1. Chronic systolic CHF due to ICM -> ? R>L CHF - Echo 09/30/2017 LVEF 35-40%, severe RV dilation, severe RAE, Severe TR, PA peak pressure.  - Volume overloaded on exam with R>L CHF.  - Continue torsemide 60 mg BID for now - Add metolazone 2.5 mg x 2 days with extra 20 meq of K. BMET today and next week.  - Continue coreg 6.25 mg BID - Reinforced fluid restriction to < 2 L daily, sodium restriction to less than 2000 mg daily, and the importance of daily weights.    2. CAD sp CABG - No s/s of ischemia.    - Continue atorvastatin 40 mg daily.  - No ASA with stable CAD on Eliquis.   3. HTN - Stable on current meds. Will not increase meds with on-going diuresis.   4. Persistent Afib - Rate controlled.  - Continue Eliquis. Denies bleeding.   5. CKD stage 3 - BMET today and next week via [place].   6. Morbid obesity - Stressed importance of weight loss, and portion control.   7. Chronic venous stasis with RLE wound/Bullae - Bullae measured at 9 cm on arrival.  Successful drainage at bedside in clinic with approx 75 cc of fluid serous fluid drained. - Will order HH for unna boots, wound care, and BMET next week.   Long talk about fluid restriction. Diuretics and labs as above. Markedly volume overloaded peripherally > centrally. Adding metolazone as above. RTC 2 weeks to re-assess volume status.   Shirley Friar, PA-C 10/09/17   Greater than 50% of the 50 minute visit was spent in counseling/coordination of care regarding disease state education, salt/fluid restriction, sliding scale diuretics, and medication compliance.

## 2017-10-10 ENCOUNTER — Telehealth (HOSPITAL_COMMUNITY): Payer: Self-pay

## 2017-10-10 MED ORDER — POTASSIUM CHLORIDE CRYS ER 20 MEQ PO TBCR: 40.0000 meq | EXTENDED_RELEASE_TABLET | Freq: Every day | ORAL | 6 refills | Status: DC

## 2017-10-10 NOTE — Telephone Encounter (Signed)
Result Notes for Basic metabolic panel   Notes recorded by Effie Berkshire, RN on 10/10/2017 at 3:55 PM EST Patient aware and agreeable ------  Notes recorded by Shirley Friar, PA-C on 10/09/2017 at 4:20 PM EST Please increase K to 40 meq daily. (Still take extra with metolazone.)   Legrand Como "Jonni Sanger" [place], [place] 10/09/2017 4:20 PM

## 2017-10-10 NOTE — Telephone Encounter (Signed)
Received other documents needed to complete this application for patient assistance for  [facility], have faxed successfully to [employer].

## 2017-10-12 DIAGNOSIS — L97211 Non-pressure chronic ulcer of right calf limited to breakdown of skin: Secondary | ICD-10-CM | POA: Diagnosis not present

## 2017-10-12 DIAGNOSIS — Z7901 Long term (current) use of anticoagulants: Secondary | ICD-10-CM | POA: Diagnosis not present

## 2017-10-12 DIAGNOSIS — Z794 Long term (current) use of insulin: Secondary | ICD-10-CM | POA: Diagnosis not present

## 2017-10-12 DIAGNOSIS — I130 Hypertensive heart and chronic kidney disease with heart failure and stage 1 through stage 4 chronic kidney disease, or unspecified chronic kidney disease: Secondary | ICD-10-CM | POA: Diagnosis not present

## 2017-10-12 DIAGNOSIS — I872 Venous insufficiency (chronic) (peripheral): Secondary | ICD-10-CM | POA: Diagnosis not present

## 2017-10-12 DIAGNOSIS — G4733 Obstructive sleep apnea (adult) (pediatric): Secondary | ICD-10-CM | POA: Diagnosis not present

## 2017-10-12 DIAGNOSIS — N183 Chronic kidney disease, stage 3 (moderate): Secondary | ICD-10-CM | POA: Diagnosis not present

## 2017-10-12 DIAGNOSIS — I5022 Chronic systolic (congestive) heart failure: Secondary | ICD-10-CM | POA: Diagnosis not present

## 2017-10-12 DIAGNOSIS — I25810 Atherosclerosis of coronary artery bypass graft(s) without angina pectoris: Secondary | ICD-10-CM | POA: Diagnosis not present

## 2017-10-12 DIAGNOSIS — I255 Ischemic cardiomyopathy: Secondary | ICD-10-CM | POA: Diagnosis not present

## 2017-10-12 DIAGNOSIS — I69354 Hemiplegia and hemiparesis following cerebral infarction affecting left non-dominant side: Secondary | ICD-10-CM | POA: Diagnosis not present

## 2017-10-12 DIAGNOSIS — Z8719 Personal history of other diseases of the digestive system: Secondary | ICD-10-CM | POA: Diagnosis not present

## 2017-10-12 DIAGNOSIS — G894 Chronic pain syndrome: Secondary | ICD-10-CM | POA: Diagnosis not present

## 2017-10-12 DIAGNOSIS — E1122 Type 2 diabetes mellitus with diabetic chronic kidney disease: Secondary | ICD-10-CM | POA: Diagnosis not present

## 2017-10-12 DIAGNOSIS — E1140 Type 2 diabetes mellitus with diabetic neuropathy, unspecified: Secondary | ICD-10-CM | POA: Diagnosis not present

## 2017-10-12 DIAGNOSIS — I481 Persistent atrial fibrillation: Secondary | ICD-10-CM | POA: Diagnosis not present

## 2017-10-12 DIAGNOSIS — Z951 Presence of aortocoronary bypass graft: Secondary | ICD-10-CM | POA: Diagnosis not present

## 2017-10-14 DIAGNOSIS — N183 Chronic kidney disease, stage 3 (moderate): Secondary | ICD-10-CM | POA: Diagnosis not present

## 2017-10-15 ENCOUNTER — Telehealth: Payer: Self-pay | Admitting: Family Medicine

## 2017-10-15 DIAGNOSIS — Z951 Presence of aortocoronary bypass graft: Secondary | ICD-10-CM | POA: Diagnosis not present

## 2017-10-15 DIAGNOSIS — I130 Hypertensive heart and chronic kidney disease with heart failure and stage 1 through stage 4 chronic kidney disease, or unspecified chronic kidney disease: Secondary | ICD-10-CM | POA: Diagnosis not present

## 2017-10-15 DIAGNOSIS — Z7901 Long term (current) use of anticoagulants: Secondary | ICD-10-CM | POA: Diagnosis not present

## 2017-10-15 DIAGNOSIS — Z8719 Personal history of other diseases of the digestive system: Secondary | ICD-10-CM | POA: Diagnosis not present

## 2017-10-15 DIAGNOSIS — E1122 Type 2 diabetes mellitus with diabetic chronic kidney disease: Secondary | ICD-10-CM | POA: Diagnosis not present

## 2017-10-15 DIAGNOSIS — I5022 Chronic systolic (congestive) heart failure: Secondary | ICD-10-CM | POA: Diagnosis not present

## 2017-10-15 DIAGNOSIS — I255 Ischemic cardiomyopathy: Secondary | ICD-10-CM | POA: Diagnosis not present

## 2017-10-15 DIAGNOSIS — I481 Persistent atrial fibrillation: Secondary | ICD-10-CM | POA: Diagnosis not present

## 2017-10-15 DIAGNOSIS — L97211 Non-pressure chronic ulcer of right calf limited to breakdown of skin: Secondary | ICD-10-CM | POA: Diagnosis not present

## 2017-10-15 DIAGNOSIS — G894 Chronic pain syndrome: Secondary | ICD-10-CM | POA: Diagnosis not present

## 2017-10-15 DIAGNOSIS — E1140 Type 2 diabetes mellitus with diabetic neuropathy, unspecified: Secondary | ICD-10-CM | POA: Diagnosis not present

## 2017-10-15 DIAGNOSIS — N183 Chronic kidney disease, stage 3 (moderate): Secondary | ICD-10-CM | POA: Diagnosis not present

## 2017-10-15 DIAGNOSIS — I872 Venous insufficiency (chronic) (peripheral): Secondary | ICD-10-CM | POA: Diagnosis not present

## 2017-10-15 DIAGNOSIS — I25810 Atherosclerosis of coronary artery bypass graft(s) without angina pectoris: Secondary | ICD-10-CM | POA: Diagnosis not present

## 2017-10-15 DIAGNOSIS — G4733 Obstructive sleep apnea (adult) (pediatric): Secondary | ICD-10-CM | POA: Diagnosis not present

## 2017-10-15 DIAGNOSIS — I69354 Hemiplegia and hemiparesis following cerebral infarction affecting left non-dominant side: Secondary | ICD-10-CM | POA: Diagnosis not present

## 2017-10-15 DIAGNOSIS — Z794 Long term (current) use of insulin: Secondary | ICD-10-CM | POA: Diagnosis not present

## 2017-10-15 NOTE — Telephone Encounter (Signed)
Olivia Mackie with [facility] called and said pt has gotten a wound on her leg recently. It used to be a blister, it popped and now is an open would. Olivia Mackie is wanting to check with Dr Juanito Doom to see if its fine for pt to apply opticell and unnaboot 2 times a week. Please contact Olivia Mackie at [phone] to notify her if this treatment is fine.

## 2017-10-16 ENCOUNTER — Telehealth: Payer: Self-pay | Admitting: Family Medicine

## 2017-10-16 ENCOUNTER — Other Ambulatory Visit: Payer: Self-pay | Admitting: Family Medicine

## 2017-10-16 DIAGNOSIS — I25810 Atherosclerosis of coronary artery bypass graft(s) without angina pectoris: Secondary | ICD-10-CM | POA: Diagnosis not present

## 2017-10-16 DIAGNOSIS — G4733 Obstructive sleep apnea (adult) (pediatric): Secondary | ICD-10-CM | POA: Diagnosis not present

## 2017-10-16 DIAGNOSIS — E1122 Type 2 diabetes mellitus with diabetic chronic kidney disease: Secondary | ICD-10-CM | POA: Diagnosis not present

## 2017-10-16 DIAGNOSIS — I5022 Chronic systolic (congestive) heart failure: Secondary | ICD-10-CM | POA: Diagnosis not present

## 2017-10-16 DIAGNOSIS — N183 Chronic kidney disease, stage 3 (moderate): Secondary | ICD-10-CM | POA: Diagnosis not present

## 2017-10-16 DIAGNOSIS — N179 Acute kidney failure, unspecified: Secondary | ICD-10-CM | POA: Diagnosis not present

## 2017-10-16 NOTE — Telephone Encounter (Signed)
Verbal order given.   Smitty Cords, MD [place], PGY-3

## 2017-10-16 NOTE — Telephone Encounter (Signed)
Pt would like refill on her tramadol sent to [facility]. She is requesting a bigger quantity since she takes 3 a day and would like to last her at least a month. She is disabled and its hard for her to get around and she doesn't want to go to the drug store multiple times a month. Please advise

## 2017-10-17 DIAGNOSIS — I872 Venous insufficiency (chronic) (peripheral): Secondary | ICD-10-CM | POA: Diagnosis not present

## 2017-10-17 DIAGNOSIS — Z8719 Personal history of other diseases of the digestive system: Secondary | ICD-10-CM | POA: Diagnosis not present

## 2017-10-17 DIAGNOSIS — I5022 Chronic systolic (congestive) heart failure: Secondary | ICD-10-CM | POA: Diagnosis not present

## 2017-10-17 DIAGNOSIS — I130 Hypertensive heart and chronic kidney disease with heart failure and stage 1 through stage 4 chronic kidney disease, or unspecified chronic kidney disease: Secondary | ICD-10-CM | POA: Diagnosis not present

## 2017-10-17 DIAGNOSIS — G894 Chronic pain syndrome: Secondary | ICD-10-CM | POA: Diagnosis not present

## 2017-10-17 DIAGNOSIS — E1140 Type 2 diabetes mellitus with diabetic neuropathy, unspecified: Secondary | ICD-10-CM | POA: Diagnosis not present

## 2017-10-17 DIAGNOSIS — E1122 Type 2 diabetes mellitus with diabetic chronic kidney disease: Secondary | ICD-10-CM | POA: Diagnosis not present

## 2017-10-17 DIAGNOSIS — Z951 Presence of aortocoronary bypass graft: Secondary | ICD-10-CM | POA: Diagnosis not present

## 2017-10-17 DIAGNOSIS — N183 Chronic kidney disease, stage 3 (moderate): Secondary | ICD-10-CM | POA: Diagnosis not present

## 2017-10-17 DIAGNOSIS — L97211 Non-pressure chronic ulcer of right calf limited to breakdown of skin: Secondary | ICD-10-CM | POA: Diagnosis not present

## 2017-10-17 DIAGNOSIS — Z7901 Long term (current) use of anticoagulants: Secondary | ICD-10-CM | POA: Diagnosis not present

## 2017-10-17 DIAGNOSIS — G4733 Obstructive sleep apnea (adult) (pediatric): Secondary | ICD-10-CM | POA: Diagnosis not present

## 2017-10-17 DIAGNOSIS — I481 Persistent atrial fibrillation: Secondary | ICD-10-CM | POA: Diagnosis not present

## 2017-10-17 DIAGNOSIS — I25810 Atherosclerosis of coronary artery bypass graft(s) without angina pectoris: Secondary | ICD-10-CM | POA: Diagnosis not present

## 2017-10-17 DIAGNOSIS — I69354 Hemiplegia and hemiparesis following cerebral infarction affecting left non-dominant side: Secondary | ICD-10-CM | POA: Diagnosis not present

## 2017-10-17 DIAGNOSIS — I255 Ischemic cardiomyopathy: Secondary | ICD-10-CM | POA: Diagnosis not present

## 2017-10-17 DIAGNOSIS — Z794 Long term (current) use of insulin: Secondary | ICD-10-CM | POA: Diagnosis not present

## 2017-10-17 MED ORDER — TRAMADOL HCL 50 MG PO TABS: ORAL_TABLET | ORAL | 0 refills | Status: DC

## 2017-10-17 NOTE — Telephone Encounter (Signed)
Will print and fax Rx for #90 pills to her pharmacy. I cannot prescribe more than that per month.   Smitty Cords, MD [place], PGY-3

## 2017-10-18 ENCOUNTER — Other Ambulatory Visit: Payer: Self-pay | Admitting: Family Medicine

## 2017-10-18 DIAGNOSIS — G4733 Obstructive sleep apnea (adult) (pediatric): Secondary | ICD-10-CM | POA: Diagnosis not present

## 2017-10-18 DIAGNOSIS — E1122 Type 2 diabetes mellitus with diabetic chronic kidney disease: Secondary | ICD-10-CM | POA: Diagnosis not present

## 2017-10-18 DIAGNOSIS — I25810 Atherosclerosis of coronary artery bypass graft(s) without angina pectoris: Secondary | ICD-10-CM | POA: Diagnosis not present

## 2017-10-18 DIAGNOSIS — N183 Chronic kidney disease, stage 3 (moderate): Secondary | ICD-10-CM | POA: Diagnosis not present

## 2017-10-18 DIAGNOSIS — E1140 Type 2 diabetes mellitus with diabetic neuropathy, unspecified: Secondary | ICD-10-CM | POA: Diagnosis not present

## 2017-10-18 DIAGNOSIS — L97211 Non-pressure chronic ulcer of right calf limited to breakdown of skin: Secondary | ICD-10-CM | POA: Diagnosis not present

## 2017-10-18 DIAGNOSIS — Z7901 Long term (current) use of anticoagulants: Secondary | ICD-10-CM | POA: Diagnosis not present

## 2017-10-18 DIAGNOSIS — Z8719 Personal history of other diseases of the digestive system: Secondary | ICD-10-CM | POA: Diagnosis not present

## 2017-10-18 DIAGNOSIS — I5022 Chronic systolic (congestive) heart failure: Secondary | ICD-10-CM | POA: Diagnosis not present

## 2017-10-18 DIAGNOSIS — Z794 Long term (current) use of insulin: Secondary | ICD-10-CM | POA: Diagnosis not present

## 2017-10-18 DIAGNOSIS — I255 Ischemic cardiomyopathy: Secondary | ICD-10-CM | POA: Diagnosis not present

## 2017-10-18 DIAGNOSIS — Z951 Presence of aortocoronary bypass graft: Secondary | ICD-10-CM | POA: Diagnosis not present

## 2017-10-18 DIAGNOSIS — I872 Venous insufficiency (chronic) (peripheral): Secondary | ICD-10-CM | POA: Diagnosis not present

## 2017-10-18 DIAGNOSIS — I481 Persistent atrial fibrillation: Secondary | ICD-10-CM | POA: Diagnosis not present

## 2017-10-18 DIAGNOSIS — I69354 Hemiplegia and hemiparesis following cerebral infarction affecting left non-dominant side: Secondary | ICD-10-CM | POA: Diagnosis not present

## 2017-10-18 DIAGNOSIS — I130 Hypertensive heart and chronic kidney disease with heart failure and stage 1 through stage 4 chronic kidney disease, or unspecified chronic kidney disease: Secondary | ICD-10-CM | POA: Diagnosis not present

## 2017-10-18 DIAGNOSIS — G894 Chronic pain syndrome: Secondary | ICD-10-CM | POA: Diagnosis not present

## 2017-10-21 ENCOUNTER — Ambulatory Visit: Payer: Medicare Other | Admitting: Neurology

## 2017-10-22 ENCOUNTER — Telehealth (HOSPITAL_COMMUNITY): Payer: Self-pay

## 2017-10-22 DIAGNOSIS — I255 Ischemic cardiomyopathy: Secondary | ICD-10-CM | POA: Diagnosis not present

## 2017-10-22 DIAGNOSIS — G894 Chronic pain syndrome: Secondary | ICD-10-CM | POA: Diagnosis not present

## 2017-10-22 DIAGNOSIS — I130 Hypertensive heart and chronic kidney disease with heart failure and stage 1 through stage 4 chronic kidney disease, or unspecified chronic kidney disease: Secondary | ICD-10-CM | POA: Diagnosis not present

## 2017-10-22 DIAGNOSIS — I5022 Chronic systolic (congestive) heart failure: Secondary | ICD-10-CM | POA: Diagnosis not present

## 2017-10-22 DIAGNOSIS — I69354 Hemiplegia and hemiparesis following cerebral infarction affecting left non-dominant side: Secondary | ICD-10-CM | POA: Diagnosis not present

## 2017-10-22 DIAGNOSIS — I25810 Atherosclerosis of coronary artery bypass graft(s) without angina pectoris: Secondary | ICD-10-CM | POA: Diagnosis not present

## 2017-10-22 DIAGNOSIS — Z7901 Long term (current) use of anticoagulants: Secondary | ICD-10-CM | POA: Diagnosis not present

## 2017-10-22 DIAGNOSIS — Z8719 Personal history of other diseases of the digestive system: Secondary | ICD-10-CM | POA: Diagnosis not present

## 2017-10-22 DIAGNOSIS — N183 Chronic kidney disease, stage 3 (moderate): Secondary | ICD-10-CM | POA: Diagnosis not present

## 2017-10-22 DIAGNOSIS — L97211 Non-pressure chronic ulcer of right calf limited to breakdown of skin: Secondary | ICD-10-CM | POA: Diagnosis not present

## 2017-10-22 DIAGNOSIS — I481 Persistent atrial fibrillation: Secondary | ICD-10-CM | POA: Diagnosis not present

## 2017-10-22 DIAGNOSIS — E1140 Type 2 diabetes mellitus with diabetic neuropathy, unspecified: Secondary | ICD-10-CM | POA: Diagnosis not present

## 2017-10-22 DIAGNOSIS — G4733 Obstructive sleep apnea (adult) (pediatric): Secondary | ICD-10-CM | POA: Diagnosis not present

## 2017-10-22 DIAGNOSIS — E1122 Type 2 diabetes mellitus with diabetic chronic kidney disease: Secondary | ICD-10-CM | POA: Diagnosis not present

## 2017-10-22 DIAGNOSIS — I872 Venous insufficiency (chronic) (peripheral): Secondary | ICD-10-CM | POA: Diagnosis not present

## 2017-10-22 DIAGNOSIS — Z794 Long term (current) use of insulin: Secondary | ICD-10-CM | POA: Diagnosis not present

## 2017-10-22 DIAGNOSIS — Z951 Presence of aortocoronary bypass graft: Secondary | ICD-10-CM | POA: Diagnosis not present

## 2017-10-22 NOTE — Telephone Encounter (Signed)
CHF Clinic appointment reminder call placed to patient for upcoming post-hospital follow up.  Does understand purpose of this appointment and where CHF Clinic is located? Yes  How is patient feeling? Well, no complaints  Does patient have all of their medications since their recent discharge? Yes  Patient also reminded to take all medications as prescribed on the day of his/her appointment and to bring all medications to this appointment.  Advised to call our office for tardiness or cancellations/rescheduling needs.  .Misty Rago [place]  

## 2017-10-23 ENCOUNTER — Encounter (HOSPITAL_COMMUNITY): Payer: Medicare Other

## 2017-10-23 ENCOUNTER — Other Ambulatory Visit: Payer: Self-pay | Admitting: Family Medicine

## 2017-10-23 ENCOUNTER — Other Ambulatory Visit: Payer: Self-pay | Admitting: *Deleted

## 2017-10-23 ENCOUNTER — Encounter (HOSPITAL_COMMUNITY): Payer: Self-pay

## 2017-10-23 ENCOUNTER — Ambulatory Visit (HOSPITAL_COMMUNITY)
Admission: RE | Admit: 2017-10-23 | Discharge: 2017-10-23 | Disposition: A | Discharge status: Home / Self Care | Payer: Medicare Other | Source: Ambulatory Visit | Attending: Internal Medicine | Admitting: Internal Medicine

## 2017-10-23 VITALS — BP 88/64 | HR 73 | Wt 285.2 lb

## 2017-10-23 DIAGNOSIS — G894 Chronic pain syndrome: Secondary | ICD-10-CM | POA: Diagnosis not present

## 2017-10-23 DIAGNOSIS — E1151 Type 2 diabetes mellitus with diabetic peripheral angiopathy without gangrene: Secondary | ICD-10-CM | POA: Diagnosis not present

## 2017-10-23 DIAGNOSIS — Z794 Long term (current) use of insulin: Secondary | ICD-10-CM | POA: Diagnosis not present

## 2017-10-23 DIAGNOSIS — N183 Chronic kidney disease, stage 3 unspecified: Secondary | ICD-10-CM

## 2017-10-23 DIAGNOSIS — I25810 Atherosclerosis of coronary artery bypass graft(s) without angina pectoris: Secondary | ICD-10-CM

## 2017-10-23 DIAGNOSIS — S81801A Unspecified open wound, right lower leg, initial encounter: Secondary | ICD-10-CM | POA: Diagnosis not present

## 2017-10-23 DIAGNOSIS — Z951 Presence of aortocoronary bypass graft: Secondary | ICD-10-CM | POA: Diagnosis not present

## 2017-10-23 DIAGNOSIS — Z7901 Long term (current) use of anticoagulants: Secondary | ICD-10-CM | POA: Diagnosis not present

## 2017-10-23 DIAGNOSIS — Z79899 Other long term (current) drug therapy: Secondary | ICD-10-CM | POA: Diagnosis not present

## 2017-10-23 DIAGNOSIS — I5022 Chronic systolic (congestive) heart failure: Secondary | ICD-10-CM | POA: Diagnosis not present

## 2017-10-23 DIAGNOSIS — Z8673 Personal history of transient ischemic attack (TIA), and cerebral infarction without residual deficits: Secondary | ICD-10-CM | POA: Insufficient documentation

## 2017-10-23 DIAGNOSIS — I878 Other specified disorders of veins: Secondary | ICD-10-CM

## 2017-10-23 DIAGNOSIS — E1122 Type 2 diabetes mellitus with diabetic chronic kidney disease: Secondary | ICD-10-CM | POA: Diagnosis not present

## 2017-10-23 DIAGNOSIS — E785 Hyperlipidemia, unspecified: Secondary | ICD-10-CM | POA: Diagnosis not present

## 2017-10-23 DIAGNOSIS — I2510 Atherosclerotic heart disease of native coronary artery without angina pectoris: Secondary | ICD-10-CM | POA: Insufficient documentation

## 2017-10-23 DIAGNOSIS — E1142 Type 2 diabetes mellitus with diabetic polyneuropathy: Secondary | ICD-10-CM

## 2017-10-23 DIAGNOSIS — X58XXXA Exposure to other specified factors, initial encounter: Secondary | ICD-10-CM | POA: Insufficient documentation

## 2017-10-23 DIAGNOSIS — G4730 Sleep apnea, unspecified: Secondary | ICD-10-CM | POA: Diagnosis not present

## 2017-10-23 DIAGNOSIS — I255 Ischemic cardiomyopathy: Secondary | ICD-10-CM | POA: Diagnosis not present

## 2017-10-23 DIAGNOSIS — I10 Essential (primary) hypertension: Secondary | ICD-10-CM | POA: Diagnosis not present

## 2017-10-23 DIAGNOSIS — I130 Hypertensive heart and chronic kidney disease with heart failure and stage 1 through stage 4 chronic kidney disease, or unspecified chronic kidney disease: Secondary | ICD-10-CM | POA: Diagnosis not present

## 2017-10-23 DIAGNOSIS — I4819 Other persistent atrial fibrillation: Secondary | ICD-10-CM

## 2017-10-23 DIAGNOSIS — I481 Persistent atrial fibrillation: Secondary | ICD-10-CM | POA: Insufficient documentation

## 2017-10-23 MED ORDER — TORSEMIDE 20 MG PO TABS: 60.0000 mg | ORAL_TABLET | Freq: Two times a day (BID) | ORAL | 0 refills | Status: DC

## 2017-10-23 MED ORDER — TORSEMIDE 20 MG PO TABS: 60.0000 mg | ORAL_TABLET | Freq: Two times a day (BID) | ORAL | 3 refills | Status: DC

## 2017-10-23 MED ORDER — ATORVASTATIN CALCIUM 40 MG PO TABS: 40.0000 mg | ORAL_TABLET | Freq: Every day | ORAL | 1 refills | Status: DC

## 2017-10-23 MED ORDER — VITAMIN B-12 1000 MCG PO TABS: 1000.0000 ug | ORAL_TABLET | Freq: Every day | ORAL | 0 refills | Status: DC

## 2017-10-23 MED ORDER — CARVEDILOL 6.25 MG PO TABS: 6.2500 mg | ORAL_TABLET | Freq: Two times a day (BID) | ORAL | 6 refills | Status: DC

## 2017-10-23 NOTE — Progress Notes (Signed)
Advanced Heart Failure Clinic Note   Referring Physician: Dr. Angelena Form Primary Care: Dr. Juanito Doom Primary Cardiologist: Dr. Angelena Form HF: New  HPI:  [name] is a 76 y.o. female [name] is a 76 y.o. female with history of CAD status post CABG 2009, ischemic cardiomyopathy with chronic systolic CHF, persistent atrial fibrillation with stroke in 03/2016 when INR was subtherapeutic, stage III CKD, morbid obesity, pulmonary hypertension, diabetes, GI bleed 03/2017, and severe sleep apnea.   Seen in [facility] clinics 10/30 and 11/13, both times with volume overload and marked peripheral edema with skin breakdown. Torsemide increased at each visit.   Pt presents today for regular follow up. At last visit metolazone added x 2 days and legs wrapped. Weight down 28 lbs. Weight from 312 to 284 at home. She denies lightheadedness or dizziness. Orthopnea has greatly improved. Continues to use CPAP nightly. Peripheral edema much improved with only occasional seepage of clear fluid from the wound on her RLE (previously a blister). [facility] providing care with unna boots.  Denies discharge, fever, chills, or foul odor. Watching salt and fluid intake.   Labs 10/14/17 K 3.9, Creatinine 1.65  Echo 09/30/2017 LVEF 35-40%, severe RV dilation, severe RAE, Severe TR, PA peak pressure.   Review of systems complete and found to be negative unless listed in HPI.    Past Medical History:  Diagnosis Date  . ACC/AHA stage C congestive heart failure due to ischemic cardiomyopathy ([place]) 01/31/2009   Qualifier: Diagnosis of  By: Olevia Perches, MD, Glenetta Hew   . ANXIETY 01/31/2009   Qualifier: Diagnosis of  By: Lovette Cliche, CNA, Christy    . Arthritis    "qwhere" (03/30/2016)  . Benign essential HTN   . CAD (coronary artery disease) 05/2008   a. s/p CABG in 2009.  Marland Kitchen Cerebrovascular accident (CVA) due to embolism of right middle cerebral artery ([place])   . Chronic anticoagulation   . Chronic kidney disease (CKD), stage III  (moderate) (HCC)   . Chronic lower back pain   . Chronic pain syndrome   . Chronic systolic CHF (congestive heart failure) ([place])   . DM type 2 with diabetic peripheral neuropathy ([place])   . Facial weakness, post-stroke   . Gastrointestinal hemorrhage 03/26/2017  . Hemiparesis ([place])   . History of CVA with residual deficit 03/26/2017  . Hyperlipidemia   . Hypertension   . Ischemic cardiomyopathy   . Longstanding persistent atrial fibrillation ([place])    a. Postoperative in 2009;  S/P transesophageal echocardiography-guided cardioversion, previously on Amiodarone and Coumadin. b. recurrent in April 2013 -> pt elected rate control strategy, initially Coumadin -> had stroke with subtherapeutic INR in 03/2016 and transitioned to Eliquis.  . Morbid obesity ([place]) 03/26/2017  . Persistent atrial fibrillation ([place])   . Thrombocytopenia ([place])     Current Outpatient Medications  Medication Sig Dispense Refill  . apixaban (ELIQUIS) 5 MG TABS tablet Take 1 tablet (5 mg total) by mouth 2 (two) times daily. 180 tablet 3  . atorvastatin (LIPITOR) 40 MG tablet Take 40 mg by mouth daily at 6 PM.    . carvedilol (COREG) 6.25 MG tablet Take 1 tablet (6.25 mg total) by mouth 2 (two) times daily with a meal. 60 tablet 6  . cholecalciferol (VITAMIN D) 1000 UNITS tablet Take 1,000 Units by mouth 2 (two) times daily.    Marland Kitchen docusate sodium (COLACE) 100 MG capsule Take 200 mg by mouth at bedtime.     . gabapentin (NEURONTIN) 400 MG capsule  Take 400 mg by mouth 2 (two) times daily.    . insulin glargine (LANTUS) 100 UNIT/ML injection Inject 23 Units into the skin at bedtime.    . metolazone (ZAROXOLYN) 2.5 MG tablet Take 1 tablet (2.5 mg total) by mouth daily as needed. Take extra 20 meq potassium tab. Must call CHF clinic before taking ([phone]) 5 tablet 0  . nitroGLYCERIN (NITROSTAT) 0.4 MG SL tablet Place 0.4 mg under the tongue every 5 (five) minutes as needed for chest pain (up to 3 doses).     . potassium  chloride SA (K-DUR,KLOR-CON) 20 MEQ tablet Take 20 mEq by mouth 2 (two) times daily.    Marland Kitchen rOPINIRole (REQUIP) 0.5 MG tablet TAKE 2 TABLETS BY MOUTH AT  BEDTIME 60 tablet 1  . torsemide (DEMADEX) 20 MG tablet Take 60 mg by mouth 2 (two) times daily.    . traMADol (ULTRAM) 50 MG tablet Take 2 tablets (100 mg) by mouth every morning and 1 tablet (50 mg) at night 90 tablet 0  . vitamin B-12 (CYANOCOBALAMIN) 1000 MCG tablet Take 1,000 mcg by mouth daily.    . potassium chloride SA (K-DUR,KLOR-CON) 20 MEQ tablet TAKE 1 TABLET BY MOUTH  DAILY (Patient not taking: Reported on 10/23/2017) 60 tablet 1   No current facility-administered medications for this encounter.    Allergies  Allergen Reactions  . Benazepril Other (See Comments)    Unknown reaction at age 32-65 - possibly dizziness  . Angiotensin Receptor Blockers Other (See Comments)    Hypotension reaction  . Fe-Succ-C-Thre-B12-Des Stomach Other (See Comments)    Unknown reaction  . Spironolactone Other (See Comments)    Dizziness   . Sulfamethoxazole-Trimethoprim Other (See Comments)    Unknown reaction   Social History   Socioeconomic History  . Marital status: Widowed    Spouse name: Not on file  . Number of children: Not on file  . Years of education: Not on file  . Highest education level: Not on file  Social Needs  . Financial resource strain: Not on file  . Food insecurity - worry: Not on file  . Food insecurity - inability: Not on file  . Transportation needs - medical: Not on file  . Transportation needs - non-medical: Not on file  Occupational History  . Not on file  Tobacco Use  . Smoking status: Never Smoker  . Smokeless tobacco: Never Used  Substance and Sexual Activity  . Alcohol use: No  . Drug use: No  . Sexual activity: Not Currently  Other Topics Concern  . Not on file  Social History Narrative   Widowed   Lives alone in [place]   2 children   Not routinely exercising   Family History  Problem  Relation Age of Onset  . Clotting disorder Mother        Cerebral hemorrhage  . Emphysema Father        COD  . Lung cancer Brother   . Heart disease Neg Hx    Vitals:   10/23/17 1012  BP: (!) 88/64  Pulse: 73  SpO2: 99%  Weight: 285 lb 3.2 oz (129.4 kg)   Wt Readings from Last 3 Encounters:  10/23/17 285 lb 3.2 oz (129.4 kg)  10/09/17 (!) 313 lb (142 kg)  09/24/17 (!) 314 lb (142.4 kg)    PHYSICAL EXAM: General: Elderly appearing. Ambulated into clinic with rollator. NAD.  HEENT: Normal Neck: Supple. JVP difficult due to body habitus. Carotids 2+ bilat; no bruits. No  thyromegaly or nodule noted. Cor: PMI nondisplaced. Irregularly irregular.  Lungs: Distant but clear.  Abdomen: Morbidly obese, soft, non-tender, non-distended, no HSM. No bruits or masses. +BS  Extremities: No cyanosis, clubbing, or rash. Trace edema above unna boots. BLE.  Neuro: Alert & orientedx3, cranial nerves grossly intact. moves all 4 extremities w/o difficulty. Affect pleasant   ASSESSMENT & PLAN:  1. Chronic systolic CHF due to ICM -> ? R>L CHF - Echo 09/30/2017 LVEF 35-40%, severe RV dilation, severe RAE, Severe TR, PA peak pressure.  - Volume status much improved. R>L CHF.  - Continue torsemide 60 mg BID for now. Recent BMET stable.  - Continue metolazone as needed. Weight down nearly 30 lbs with fluid restriction, leg wraps, and metolazone.  - Continue coreg 6.25 mg BID - Reinforced fluid restriction to < 2 L daily, sodium restriction to less than 2000 mg daily, and the importance of daily weights.    2. CAD sp CABG - No s/s of ischemia.      - Continue atorvastatin 40 mg daily.  - No ASA with stable CAD on Eliquis.   3. HTN - Low today, but stable at home in 100-110s. Will not increase meds with on-going diuresis. Denies lightheadedness and creatinine has been stable.   4. Persistent Afib - Rate controlled.  - Continue Eliquis. Denies bleeding.   5. CKD stage 3 - BMET last week  stable. Repeat next week via [place].   6. Morbid obesity - Stressed importance of weight loss, and portion control. No change.   7. Chronic venous stasis with RLE wound/Bullae - Much improved with unna boots and diuresis.  - Continue [facility]HC.   Doing much better. We will check back in 4-6 weeks and then can follow up with [place] prn.   Shirley Friar, PA-C 10/23/17   Greater than 50% of the 25 minute visit was spent in counseling/coordination of care regarding disease state education, salt/fluid restriction, sliding scale diuretics, and medication compliance.

## 2017-10-23 NOTE — Telephone Encounter (Signed)
Refilled

## 2017-10-23 NOTE — Telephone Encounter (Signed)
Received message on nurse line from [place] with [facility] [phone]) requesting refill on torsemide 20 mg tabs. Of note patient is at appt with cardiologist for CHF at present. Hubbard Hartshorn, RN, BSN

## 2017-10-23 NOTE — Patient Instructions (Signed)
Your physician recommends that you return for lab work in: 10 days with [facility]   If your weight becomes 280 or lower and/or dizzy, give [place] a call [phone] [phone]  Your physician recommends that you schedule a follow-up appointment in: 4-6 weeks with [place], PA

## 2017-10-24 ENCOUNTER — Telehealth: Payer: Self-pay | Admitting: *Deleted

## 2017-10-24 DIAGNOSIS — Z951 Presence of aortocoronary bypass graft: Secondary | ICD-10-CM | POA: Diagnosis not present

## 2017-10-24 DIAGNOSIS — I130 Hypertensive heart and chronic kidney disease with heart failure and stage 1 through stage 4 chronic kidney disease, or unspecified chronic kidney disease: Secondary | ICD-10-CM | POA: Diagnosis not present

## 2017-10-24 DIAGNOSIS — I5022 Chronic systolic (congestive) heart failure: Secondary | ICD-10-CM | POA: Diagnosis not present

## 2017-10-24 DIAGNOSIS — I872 Venous insufficiency (chronic) (peripheral): Secondary | ICD-10-CM | POA: Diagnosis not present

## 2017-10-24 DIAGNOSIS — Z8719 Personal history of other diseases of the digestive system: Secondary | ICD-10-CM | POA: Diagnosis not present

## 2017-10-24 DIAGNOSIS — G4733 Obstructive sleep apnea (adult) (pediatric): Secondary | ICD-10-CM | POA: Diagnosis not present

## 2017-10-24 DIAGNOSIS — L97211 Non-pressure chronic ulcer of right calf limited to breakdown of skin: Secondary | ICD-10-CM | POA: Diagnosis not present

## 2017-10-24 DIAGNOSIS — I69354 Hemiplegia and hemiparesis following cerebral infarction affecting left non-dominant side: Secondary | ICD-10-CM | POA: Diagnosis not present

## 2017-10-24 DIAGNOSIS — E1122 Type 2 diabetes mellitus with diabetic chronic kidney disease: Secondary | ICD-10-CM | POA: Diagnosis not present

## 2017-10-24 DIAGNOSIS — N183 Chronic kidney disease, stage 3 (moderate): Secondary | ICD-10-CM | POA: Diagnosis not present

## 2017-10-24 DIAGNOSIS — Z794 Long term (current) use of insulin: Secondary | ICD-10-CM | POA: Diagnosis not present

## 2017-10-24 DIAGNOSIS — E1140 Type 2 diabetes mellitus with diabetic neuropathy, unspecified: Secondary | ICD-10-CM | POA: Diagnosis not present

## 2017-10-24 DIAGNOSIS — I255 Ischemic cardiomyopathy: Secondary | ICD-10-CM | POA: Diagnosis not present

## 2017-10-24 DIAGNOSIS — I25810 Atherosclerosis of coronary artery bypass graft(s) without angina pectoris: Secondary | ICD-10-CM | POA: Diagnosis not present

## 2017-10-24 DIAGNOSIS — Z7901 Long term (current) use of anticoagulants: Secondary | ICD-10-CM | POA: Diagnosis not present

## 2017-10-24 DIAGNOSIS — I481 Persistent atrial fibrillation: Secondary | ICD-10-CM | POA: Diagnosis not present

## 2017-10-24 DIAGNOSIS — G894 Chronic pain syndrome: Secondary | ICD-10-CM | POA: Diagnosis not present

## 2017-10-24 NOTE — Telephone Encounter (Signed)
Received fax from [place] that patient was approved for assistance with ELIQUIS, dates 10/23/17-11/11/17. They will mail supply to patient.

## 2017-10-24 NOTE — Telephone Encounter (Signed)
[name] needs verbal orders to continue wound care to the right leg.  Wound has improved but not resolved.  [name] would like verbal orders to 1x per week for 4 weeks.  Will forward to MD. [place], [name], [place]

## 2017-10-25 NOTE — Telephone Encounter (Signed)
Verbal orders placed.  

## 2017-11-01 DIAGNOSIS — Z8719 Personal history of other diseases of the digestive system: Secondary | ICD-10-CM | POA: Diagnosis not present

## 2017-11-01 DIAGNOSIS — I872 Venous insufficiency (chronic) (peripheral): Secondary | ICD-10-CM | POA: Diagnosis not present

## 2017-11-01 DIAGNOSIS — Z794 Long term (current) use of insulin: Secondary | ICD-10-CM | POA: Diagnosis not present

## 2017-11-01 DIAGNOSIS — I69354 Hemiplegia and hemiparesis following cerebral infarction affecting left non-dominant side: Secondary | ICD-10-CM | POA: Diagnosis not present

## 2017-11-01 DIAGNOSIS — I25810 Atherosclerosis of coronary artery bypass graft(s) without angina pectoris: Secondary | ICD-10-CM | POA: Diagnosis not present

## 2017-11-01 DIAGNOSIS — L97211 Non-pressure chronic ulcer of right calf limited to breakdown of skin: Secondary | ICD-10-CM | POA: Diagnosis not present

## 2017-11-01 DIAGNOSIS — N183 Chronic kidney disease, stage 3 (moderate): Secondary | ICD-10-CM | POA: Diagnosis not present

## 2017-11-01 DIAGNOSIS — I130 Hypertensive heart and chronic kidney disease with heart failure and stage 1 through stage 4 chronic kidney disease, or unspecified chronic kidney disease: Secondary | ICD-10-CM | POA: Diagnosis not present

## 2017-11-01 DIAGNOSIS — Z951 Presence of aortocoronary bypass graft: Secondary | ICD-10-CM | POA: Diagnosis not present

## 2017-11-01 DIAGNOSIS — I255 Ischemic cardiomyopathy: Secondary | ICD-10-CM | POA: Diagnosis not present

## 2017-11-01 DIAGNOSIS — I481 Persistent atrial fibrillation: Secondary | ICD-10-CM | POA: Diagnosis not present

## 2017-11-01 DIAGNOSIS — G894 Chronic pain syndrome: Secondary | ICD-10-CM | POA: Diagnosis not present

## 2017-11-01 DIAGNOSIS — G4733 Obstructive sleep apnea (adult) (pediatric): Secondary | ICD-10-CM | POA: Diagnosis not present

## 2017-11-01 DIAGNOSIS — Z7901 Long term (current) use of anticoagulants: Secondary | ICD-10-CM | POA: Diagnosis not present

## 2017-11-01 DIAGNOSIS — E1140 Type 2 diabetes mellitus with diabetic neuropathy, unspecified: Secondary | ICD-10-CM | POA: Diagnosis not present

## 2017-11-01 DIAGNOSIS — I5022 Chronic systolic (congestive) heart failure: Secondary | ICD-10-CM | POA: Diagnosis not present

## 2017-11-01 DIAGNOSIS — E1122 Type 2 diabetes mellitus with diabetic chronic kidney disease: Secondary | ICD-10-CM | POA: Diagnosis not present

## 2017-11-03 DIAGNOSIS — G4733 Obstructive sleep apnea (adult) (pediatric): Secondary | ICD-10-CM | POA: Diagnosis not present

## 2017-11-06 ENCOUNTER — Telehealth: Payer: Self-pay | Admitting: Family Medicine

## 2017-11-06 ENCOUNTER — Telehealth (HOSPITAL_COMMUNITY): Payer: Self-pay | Admitting: *Deleted

## 2017-11-06 DIAGNOSIS — I69392 Facial weakness following cerebral infarction: Secondary | ICD-10-CM | POA: Insufficient documentation

## 2017-11-06 DIAGNOSIS — I69354 Hemiplegia and hemiparesis following cerebral infarction affecting left non-dominant side: Secondary | ICD-10-CM | POA: Diagnosis not present

## 2017-11-06 DIAGNOSIS — G4733 Obstructive sleep apnea (adult) (pediatric): Secondary | ICD-10-CM | POA: Diagnosis not present

## 2017-11-06 DIAGNOSIS — I5022 Chronic systolic (congestive) heart failure: Secondary | ICD-10-CM | POA: Diagnosis not present

## 2017-11-06 DIAGNOSIS — I25810 Atherosclerosis of coronary artery bypass graft(s) without angina pectoris: Secondary | ICD-10-CM | POA: Diagnosis not present

## 2017-11-06 DIAGNOSIS — I130 Hypertensive heart and chronic kidney disease with heart failure and stage 1 through stage 4 chronic kidney disease, or unspecified chronic kidney disease: Secondary | ICD-10-CM | POA: Diagnosis not present

## 2017-11-06 DIAGNOSIS — N183 Chronic kidney disease, stage 3 (moderate): Secondary | ICD-10-CM | POA: Diagnosis not present

## 2017-11-06 DIAGNOSIS — G894 Chronic pain syndrome: Secondary | ICD-10-CM | POA: Diagnosis not present

## 2017-11-06 DIAGNOSIS — Z8719 Personal history of other diseases of the digestive system: Secondary | ICD-10-CM | POA: Diagnosis not present

## 2017-11-06 DIAGNOSIS — I481 Persistent atrial fibrillation: Secondary | ICD-10-CM | POA: Diagnosis not present

## 2017-11-06 DIAGNOSIS — E1140 Type 2 diabetes mellitus with diabetic neuropathy, unspecified: Secondary | ICD-10-CM | POA: Diagnosis not present

## 2017-11-06 DIAGNOSIS — M1990 Unspecified osteoarthritis, unspecified site: Secondary | ICD-10-CM | POA: Insufficient documentation

## 2017-11-06 DIAGNOSIS — M545 Low back pain: Secondary | ICD-10-CM

## 2017-11-06 DIAGNOSIS — E1122 Type 2 diabetes mellitus with diabetic chronic kidney disease: Secondary | ICD-10-CM | POA: Diagnosis not present

## 2017-11-06 DIAGNOSIS — I255 Ischemic cardiomyopathy: Secondary | ICD-10-CM | POA: Diagnosis not present

## 2017-11-06 DIAGNOSIS — L97211 Non-pressure chronic ulcer of right calf limited to breakdown of skin: Secondary | ICD-10-CM | POA: Diagnosis not present

## 2017-11-06 DIAGNOSIS — I4811 Longstanding persistent atrial fibrillation: Secondary | ICD-10-CM | POA: Insufficient documentation

## 2017-11-06 DIAGNOSIS — Z7901 Long term (current) use of anticoagulants: Secondary | ICD-10-CM | POA: Diagnosis not present

## 2017-11-06 DIAGNOSIS — I10 Essential (primary) hypertension: Secondary | ICD-10-CM | POA: Insufficient documentation

## 2017-11-06 DIAGNOSIS — Z794 Long term (current) use of insulin: Secondary | ICD-10-CM | POA: Diagnosis not present

## 2017-11-06 DIAGNOSIS — I872 Venous insufficiency (chronic) (peripheral): Secondary | ICD-10-CM | POA: Diagnosis not present

## 2017-11-06 DIAGNOSIS — G8190 Hemiplegia, unspecified affecting unspecified side: Secondary | ICD-10-CM | POA: Insufficient documentation

## 2017-11-06 DIAGNOSIS — G8929 Other chronic pain: Secondary | ICD-10-CM | POA: Insufficient documentation

## 2017-11-06 DIAGNOSIS — Z951 Presence of aortocoronary bypass graft: Secondary | ICD-10-CM | POA: Diagnosis not present

## 2017-11-06 NOTE — Telephone Encounter (Signed)
I would watch for now. If weight still up tomorrow can take.     Legrand Como [address] [place], PA-C 11/06/2017 10:26 AM

## 2017-11-06 NOTE — Telephone Encounter (Signed)
[name] aware and agreeable.

## 2017-11-06 NOTE — Telephone Encounter (Signed)
[name] with [facility] called to report a 3lb weight gain in two days. Patients weight is now 289lbsfrom 286lbs. Pt is asymptomatic. Patient has PRN Metolazone but was instructed to contact our office before taking.   Message routed to [place] for advice.

## 2017-11-06 NOTE — Telephone Encounter (Signed)
Pt needs refill on Lantus sent to [facility]. Pt is completely out.

## 2017-11-07 ENCOUNTER — Telehealth: Payer: Self-pay | Admitting: *Deleted

## 2017-11-07 DIAGNOSIS — H353122 Nonexudative age-related macular degeneration, left eye, intermediate dry stage: Secondary | ICD-10-CM | POA: Diagnosis not present

## 2017-11-07 DIAGNOSIS — H353221 Exudative age-related macular degeneration, left eye, with active choroidal neovascularization: Secondary | ICD-10-CM | POA: Diagnosis not present

## 2017-11-07 DIAGNOSIS — H353112 Nonexudative age-related macular degeneration, right eye, intermediate dry stage: Secondary | ICD-10-CM | POA: Diagnosis not present

## 2017-11-07 DIAGNOSIS — H43822 Vitreomacular adhesion, left eye: Secondary | ICD-10-CM | POA: Diagnosis not present

## 2017-11-07 MED ORDER — INSULIN GLARGINE 100 UNIT/ML ~~LOC~~ SOLN: 23.0000 [IU] | Freq: Every day | SUBCUTANEOUS | 2 refills | Status: DC

## 2017-11-07 NOTE — Telephone Encounter (Signed)
refilled 

## 2017-11-07 NOTE — Addendum Note (Signed)
Addended by: Carlyle Dolly on: 11/07/2017 11:55 AM   Modules accepted: Orders

## 2017-11-07 NOTE — Telephone Encounter (Signed)
[name] from [place] called asking to chang lantus vial to lantus solostar pen which is what patient has been on. Ok given. Future Rx will need to be changed to solostar pens. Hubbard Hartshorn, RN, BSN

## 2017-11-07 NOTE — Telephone Encounter (Signed)
Completed renewal form for provider from [place] for patient assistance with [place]. Will place in Dr Alyse Low box for signature. Dr Angelena Form will not be back in office until 11/21/2017.

## 2017-11-14 DIAGNOSIS — I481 Persistent atrial fibrillation: Secondary | ICD-10-CM | POA: Diagnosis not present

## 2017-11-14 DIAGNOSIS — I255 Ischemic cardiomyopathy: Secondary | ICD-10-CM | POA: Diagnosis not present

## 2017-11-14 DIAGNOSIS — E1122 Type 2 diabetes mellitus with diabetic chronic kidney disease: Secondary | ICD-10-CM | POA: Diagnosis not present

## 2017-11-14 DIAGNOSIS — G4733 Obstructive sleep apnea (adult) (pediatric): Secondary | ICD-10-CM | POA: Diagnosis not present

## 2017-11-14 DIAGNOSIS — Z794 Long term (current) use of insulin: Secondary | ICD-10-CM | POA: Diagnosis not present

## 2017-11-14 DIAGNOSIS — I872 Venous insufficiency (chronic) (peripheral): Secondary | ICD-10-CM | POA: Diagnosis not present

## 2017-11-14 DIAGNOSIS — G894 Chronic pain syndrome: Secondary | ICD-10-CM | POA: Diagnosis not present

## 2017-11-14 DIAGNOSIS — Z7901 Long term (current) use of anticoagulants: Secondary | ICD-10-CM | POA: Diagnosis not present

## 2017-11-14 DIAGNOSIS — I130 Hypertensive heart and chronic kidney disease with heart failure and stage 1 through stage 4 chronic kidney disease, or unspecified chronic kidney disease: Secondary | ICD-10-CM | POA: Diagnosis not present

## 2017-11-14 DIAGNOSIS — E1140 Type 2 diabetes mellitus with diabetic neuropathy, unspecified: Secondary | ICD-10-CM | POA: Diagnosis not present

## 2017-11-14 DIAGNOSIS — Z951 Presence of aortocoronary bypass graft: Secondary | ICD-10-CM | POA: Diagnosis not present

## 2017-11-14 DIAGNOSIS — I69354 Hemiplegia and hemiparesis following cerebral infarction affecting left non-dominant side: Secondary | ICD-10-CM | POA: Diagnosis not present

## 2017-11-14 DIAGNOSIS — Z8719 Personal history of other diseases of the digestive system: Secondary | ICD-10-CM | POA: Diagnosis not present

## 2017-11-14 DIAGNOSIS — N183 Chronic kidney disease, stage 3 (moderate): Secondary | ICD-10-CM | POA: Diagnosis not present

## 2017-11-14 DIAGNOSIS — I25810 Atherosclerosis of coronary artery bypass graft(s) without angina pectoris: Secondary | ICD-10-CM | POA: Diagnosis not present

## 2017-11-14 DIAGNOSIS — L97211 Non-pressure chronic ulcer of right calf limited to breakdown of skin: Secondary | ICD-10-CM | POA: Diagnosis not present

## 2017-11-14 DIAGNOSIS — I5022 Chronic systolic (congestive) heart failure: Secondary | ICD-10-CM | POA: Diagnosis not present

## 2017-11-18 ENCOUNTER — Other Ambulatory Visit: Payer: Self-pay | Admitting: Family Medicine

## 2017-11-19 ENCOUNTER — Other Ambulatory Visit: Payer: Self-pay | Admitting: Family Medicine

## 2017-11-19 ENCOUNTER — Ambulatory Visit (HOSPITAL_COMMUNITY)
Admission: RE | Admit: 2017-11-19 | Discharge: 2017-11-19 | Disposition: A | Discharge status: Home / Self Care | Payer: Medicare Other | Source: Ambulatory Visit | Attending: Cardiology | Admitting: Cardiology

## 2017-11-19 VITALS — BP 122/66 | HR 70 | Wt 284.2 lb

## 2017-11-19 DIAGNOSIS — F419 Anxiety disorder, unspecified: Secondary | ICD-10-CM | POA: Insufficient documentation

## 2017-11-19 DIAGNOSIS — Z79899 Other long term (current) drug therapy: Secondary | ICD-10-CM | POA: Insufficient documentation

## 2017-11-19 DIAGNOSIS — E1122 Type 2 diabetes mellitus with diabetic chronic kidney disease: Secondary | ICD-10-CM | POA: Insufficient documentation

## 2017-11-19 DIAGNOSIS — I10 Essential (primary) hypertension: Secondary | ICD-10-CM

## 2017-11-19 DIAGNOSIS — I878 Other specified disorders of veins: Secondary | ICD-10-CM | POA: Insufficient documentation

## 2017-11-19 DIAGNOSIS — Z951 Presence of aortocoronary bypass graft: Secondary | ICD-10-CM | POA: Insufficient documentation

## 2017-11-19 DIAGNOSIS — Z794 Long term (current) use of insulin: Secondary | ICD-10-CM | POA: Insufficient documentation

## 2017-11-19 DIAGNOSIS — I130 Hypertensive heart and chronic kidney disease with heart failure and stage 1 through stage 4 chronic kidney disease, or unspecified chronic kidney disease: Secondary | ICD-10-CM | POA: Diagnosis not present

## 2017-11-19 DIAGNOSIS — I2510 Atherosclerotic heart disease of native coronary artery without angina pectoris: Secondary | ICD-10-CM | POA: Insufficient documentation

## 2017-11-19 DIAGNOSIS — E785 Hyperlipidemia, unspecified: Secondary | ICD-10-CM | POA: Insufficient documentation

## 2017-11-19 DIAGNOSIS — E1142 Type 2 diabetes mellitus with diabetic polyneuropathy: Secondary | ICD-10-CM | POA: Diagnosis not present

## 2017-11-19 DIAGNOSIS — Z9889 Other specified postprocedural states: Secondary | ICD-10-CM | POA: Diagnosis not present

## 2017-11-19 DIAGNOSIS — Z7901 Long term (current) use of anticoagulants: Secondary | ICD-10-CM | POA: Insufficient documentation

## 2017-11-19 DIAGNOSIS — S81801A Unspecified open wound, right lower leg, initial encounter: Secondary | ICD-10-CM | POA: Diagnosis not present

## 2017-11-19 DIAGNOSIS — Z882 Allergy status to sulfonamides status: Secondary | ICD-10-CM | POA: Insufficient documentation

## 2017-11-19 DIAGNOSIS — I272 Pulmonary hypertension, unspecified: Secondary | ICD-10-CM | POA: Insufficient documentation

## 2017-11-19 DIAGNOSIS — Z8673 Personal history of transient ischemic attack (TIA), and cerebral infarction without residual deficits: Secondary | ICD-10-CM | POA: Diagnosis not present

## 2017-11-19 DIAGNOSIS — M545 Low back pain: Secondary | ICD-10-CM | POA: Insufficient documentation

## 2017-11-19 DIAGNOSIS — I25810 Atherosclerosis of coronary artery bypass graft(s) without angina pectoris: Secondary | ICD-10-CM | POA: Diagnosis not present

## 2017-11-19 DIAGNOSIS — G4730 Sleep apnea, unspecified: Secondary | ICD-10-CM | POA: Insufficient documentation

## 2017-11-19 DIAGNOSIS — G894 Chronic pain syndrome: Secondary | ICD-10-CM | POA: Diagnosis not present

## 2017-11-19 DIAGNOSIS — I5022 Chronic systolic (congestive) heart failure: Secondary | ICD-10-CM | POA: Insufficient documentation

## 2017-11-19 DIAGNOSIS — I481 Persistent atrial fibrillation: Secondary | ICD-10-CM | POA: Diagnosis not present

## 2017-11-19 DIAGNOSIS — I4819 Other persistent atrial fibrillation: Secondary | ICD-10-CM

## 2017-11-19 DIAGNOSIS — X58XXXA Exposure to other specified factors, initial encounter: Secondary | ICD-10-CM | POA: Diagnosis not present

## 2017-11-19 DIAGNOSIS — I255 Ischemic cardiomyopathy: Secondary | ICD-10-CM | POA: Insufficient documentation

## 2017-11-19 DIAGNOSIS — N183 Chronic kidney disease, stage 3 unspecified: Secondary | ICD-10-CM

## 2017-11-19 LAB — BASIC METABOLIC PANEL
ANION GAP: 8 (ref 5–15)
BUN: 27 mg/dL — ABNORMAL HIGH (ref 6–20)
CO2: 30 mmol/L (ref 22–32)
Calcium: 9.1 mg/dL (ref 8.9–10.3)
Chloride: 99 mmol/L — ABNORMAL LOW (ref 101–111)
Creatinine, Ser: 1.66 mg/dL — ABNORMAL HIGH (ref 0.44–1.00)
GFR, EST AFRICAN AMERICAN: 33 mL/min — AB (ref >60)
GFR, EST NON AFRICAN AMERICAN: 28 mL/min — AB (ref >60)
Glucose, Bld: 117 mg/dL — ABNORMAL HIGH (ref 65–99)
POTASSIUM: 3.4 mmol/L — AB (ref 3.5–5.1)
Sodium: 137 mmol/L (ref 135–145)

## 2017-11-19 NOTE — Progress Notes (Signed)
Advanced Heart Failure Clinic Note   Referring Physician: Dr. Angelena Form Primary Care: Dr. Juanito Doom Primary Cardiologist: Dr. Angelena Form HF: New  HPI:  [name] is a 77 y.o. female [name] is a 77 y.o. female with history of CAD status post CABG 2009, ischemic cardiomyopathy with chronic systolic CHF, persistent atrial fibrillation with stroke in 03/2016 when INR was subtherapeutic, stage III CKD, morbid obesity, pulmonary hypertension, diabetes, GI bleed 03/2017, and severe sleep apnea.   Seen in [facility] clinics 10/30 and 11/13, both times with volume overload and marked peripheral edema with skin breakdown. Torsemide increased at each visit.   She presents today for regular follow up. Weight at home remains stable at ~ 282. She has not required any further metolazone.  [facility] have completed treatment of her RLE wound and it has completely healed. They recommended compression hose but pt has not been able to pick up yet. Denies SOB. Wearing CPAP nightly. No chest pain. Trace peripheral/lymph edema at baseline. Taking all medication as directed.  Pt son states she hasn't looked this good in a long time.   Labs 10/14/17 K 3.9, Creatinine 1.65  Echo 09/30/2017 LVEF 35-40%, severe RV dilation, severe RAE, Severe TR, PA peak pressure.   Review of systems complete and found to be negative unless listed in HPI.    Past Medical History:  Diagnosis Date  . ACC/AHA stage C congestive heart failure due to ischemic cardiomyopathy ([place]) 01/31/2009   Qualifier: Diagnosis of  By: Olevia Perches, MD, Glenetta Hew   . ANXIETY 01/31/2009   Qualifier: Diagnosis of  By: Lovette Cliche, CNA, Christy    . Arthritis    "qwhere" (03/30/2016)  . Benign essential HTN   . CAD (coronary artery disease) 05/2008   a. s/p CABG in 2009.  Marland Kitchen Cerebrovascular accident (CVA) due to embolism of right middle cerebral artery ([place])   . Chronic anticoagulation   . Chronic kidney disease (CKD), stage III (moderate) (HCC)   . Chronic  lower back pain   . Chronic pain syndrome   . Chronic systolic CHF (congestive heart failure) ([place])   . DM type 2 with diabetic peripheral neuropathy ([place])   . Facial weakness, post-stroke   . Gastrointestinal hemorrhage 03/26/2017  . Hemiparesis ([place])   . History of CVA with residual deficit 03/26/2017  . Hyperlipidemia   . Hypertension   . Ischemic cardiomyopathy   . Longstanding persistent atrial fibrillation ([place])    a. Postoperative in 2009;  S/P transesophageal echocardiography-guided cardioversion, previously on Amiodarone and Coumadin. b. recurrent in April 2013 -> pt elected rate control strategy, initially Coumadin -> had stroke with subtherapeutic INR in 03/2016 and transitioned to Eliquis.  . Morbid obesity ([place]) 03/26/2017  . Persistent atrial fibrillation ([place])   . Thrombocytopenia ([place])     Current Outpatient Medications  Medication Sig Dispense Refill  . atorvastatin (LIPITOR) 40 MG tablet Take 1 tablet (40 mg total) by mouth daily at 6 PM. 90 tablet 1  . carvedilol (COREG) 6.25 MG tablet Take 1 tablet (6.25 mg total) by mouth 2 (two) times daily with a meal. 60 tablet 6  . cholecalciferol (VITAMIN D) 1000 UNITS tablet Take 1,000 Units by mouth 2 (two) times daily.    Marland Kitchen docusate sodium (COLACE) 100 MG capsule Take 200 mg by mouth at bedtime.     Marland Kitchen ELIQUIS 5 MG TABS tablet TAKE 1 TABLET BY MOUTH TWICE DAILY 60 tablet 0  . gabapentin (NEURONTIN) 400 MG capsule Take 400 mg  by mouth 2 (two) times daily.    . insulin glargine (LANTUS) 100 UNIT/ML injection Inject 0.23 mLs (23 Units total) into the skin at bedtime. 10 mL 2  . metolazone (ZAROXOLYN) 2.5 MG tablet Take 1 tablet (2.5 mg total) by mouth daily as needed. Take extra 20 meq potassium tab. Must call CHF clinic before taking ([phone]) 5 tablet 0  . nitroGLYCERIN (NITROSTAT) 0.4 MG SL tablet Place 0.4 mg under the tongue every 5 (five) minutes as needed for chest pain (up to 3 doses).     . potassium chloride SA  (K-DUR,KLOR-CON) 20 MEQ tablet Take 20 mEq by mouth 2 (two) times daily.    Marland Kitchen rOPINIRole (REQUIP) 0.5 MG tablet TAKE 2 TABLETS BY MOUTH AT  BEDTIME 60 tablet 1  . torsemide (DEMADEX) 20 MG tablet Take 3 tablets (60 mg total) by mouth 2 (two) times daily. 180 tablet 0  . traMADol (ULTRAM) 50 MG tablet TAKE 2 TABLETS BY MOUTH EVERY MORNING AND TAKE 1 TABLET AT NIGHT 90 tablet 0  . vitamin B-12 (CYANOCOBALAMIN) 1000 MCG tablet Take 1 tablet (1,000 mcg total) by mouth daily. 90 tablet 0   No current facility-administered medications for this encounter.    Allergies  Allergen Reactions  . Benazepril Other (See Comments)    Unknown reaction at age 83-65 - possibly dizziness  . Angiotensin Receptor Blockers Other (See Comments)    Hypotension reaction  . Fe-Succ-C-Thre-B12-Des Stomach Other (See Comments)    Unknown reaction  . Spironolactone Other (See Comments)    Dizziness   . Sulfamethoxazole-Trimethoprim Other (See Comments)    Unknown reaction   Social History   Socioeconomic History  . Marital status: Widowed    Spouse name: Not on file  . Number of children: Not on file  . Years of education: Not on file  . Highest education level: Not on file  Social Needs  . Financial resource strain: Not on file  . Food insecurity - worry: Not on file  . Food insecurity - inability: Not on file  . Transportation needs - medical: Not on file  . Transportation needs - non-medical: Not on file  Occupational History  . Not on file  Tobacco Use  . Smoking status: Never Smoker  . Smokeless tobacco: Never Used  Substance and Sexual Activity  . Alcohol use: No  . Drug use: No  . Sexual activity: Not Currently  Other Topics Concern  . Not on file  Social History Narrative   Widowed   Lives alone in [place]   2 children   Not routinely exercising   Family History  Problem Relation Age of Onset  . Clotting disorder Mother        Cerebral hemorrhage  . Emphysema Father        COD    . Lung cancer Brother   . Heart disease Neg Hx    Vitals:   11/19/17 1449  BP: 122/66  Pulse: 70  SpO2: 100%  Weight: 284 lb 4 oz (128.9 kg)     Wt Readings from Last 3 Encounters:  11/19/17 284 lb 4 oz (128.9 kg)  10/23/17 285 lb 3.2 oz (129.4 kg)  10/09/17 (!) 313 lb (142 kg)    PHYSICAL EXAM: General: NAD. Ambulated into clinic with rollator.  HEENT: Normal Neck: Supple. JVP difficult but does not appear elevated. Carotids 2+ bilat; no bruits. No thyromegaly or nodule noted. Cor: PMI nondisplaced. Irregularly irregular.  Lungs: Distant, but clear.  Abdomen: Soft,  non-tender, non-distended, no HSM. No bruits or masses. +BS  Extremities: No cyanosis, clubbing, or rash. Trace BLE/lymph edema.  Neuro: Alert & orientedx3, cranial nerves grossly intact. moves all 4 extremities w/o difficulty. Affect pleasant   ASSESSMENT & PLAN:  1. Chronic systolic CHF due to ICM -> ? R>L CHF - Echo 09/30/2017 LVEF 35-40%, severe RV dilation, severe RAE, Severe TR, PA peak pressure.  - Volume status stable on exam with R>L CHF.  - Continue torsemide 60 mg BID. BMET today.  - Continue metolazone as needed. Weight down >30 lbs with fluid restriction, leg wraps, and metolazone prn.  - Continue coreg 6.25 mg BID - Reinforced fluid restriction to < 2 L daily, sodium restriction to less than 2000 mg daily, and the importance of daily weights.    2. CAD sp CABG - No s/s of ischemia.     - Continue atorvastatin 40 mg daily.  - No ASA with stable CAD on Eliquis.   3. HTN - Stable on current meds.   4. Persistent Afib - Rate controlled.  - Continue Eliquis. Denies bleeding.   5. CKD stage 3 - BMET today.   6. Morbid obesity - Stressed importance of weight loss, and portion control. No change.   7. Chronic venous stasis with RLE wound/[place] - Much improved with unna boots and diuresis.  - Continue AHC.   She has improved a great deal and remains stable. Continue current meds. Labs  today. RTC 3 months. Sooner with symptoms.    Shirley Friar, PA-C 11/19/17   Greater than 50% of the 30 minute visit was spent in counseling/coordination of care regarding disease state education, salt/fluid restriction, sliding scale diuretics, and medication compliance.

## 2017-11-19 NOTE — Patient Instructions (Signed)
Routine lab work today. Will notify you of abnormal results, otherwise no news is good news!  Follow up 3 months with Oda Kilts PA-C. We will call you closer to this time, or you may call our office to schedule 1 month before you are due to be seen. Take all medication as prescribed the day of your appointment. Bring all medications with you to your appointment.  Do the following things EVERYDAY: 1) Weigh yourself in the morning before breakfast. Write it down and keep it in a log. 2) Take your medicines as prescribed 3) Eat low salt foods-Limit salt (sodium) to 2000 mg per day.  4) Stay as active as you can everyday 5) Limit all fluids for the day to less than 2 liters

## 2017-11-20 ENCOUNTER — Encounter (HOSPITAL_COMMUNITY): Payer: Medicare Other

## 2017-11-20 DIAGNOSIS — I5022 Chronic systolic (congestive) heart failure: Secondary | ICD-10-CM | POA: Diagnosis not present

## 2017-11-20 DIAGNOSIS — N183 Chronic kidney disease, stage 3 (moderate): Secondary | ICD-10-CM | POA: Diagnosis not present

## 2017-11-20 DIAGNOSIS — E876 Hypokalemia: Secondary | ICD-10-CM | POA: Insufficient documentation

## 2017-11-20 DIAGNOSIS — E1122 Type 2 diabetes mellitus with diabetic chronic kidney disease: Secondary | ICD-10-CM | POA: Diagnosis not present

## 2017-11-20 HISTORY — DX: Hypokalemia: E87.6

## 2017-11-21 ENCOUNTER — Ambulatory Visit: Payer: Medicare Other | Admitting: Cardiovascular Disease

## 2017-11-21 ENCOUNTER — Encounter: Payer: Self-pay | Admitting: Cardiovascular Disease

## 2017-11-21 VITALS — BP 124/70 | HR 72 | Ht 69.0 in | Wt 290.2 lb

## 2017-11-21 DIAGNOSIS — I10 Essential (primary) hypertension: Secondary | ICD-10-CM | POA: Diagnosis not present

## 2017-11-21 DIAGNOSIS — I4819 Other persistent atrial fibrillation: Secondary | ICD-10-CM

## 2017-11-21 DIAGNOSIS — I5022 Chronic systolic (congestive) heart failure: Secondary | ICD-10-CM | POA: Diagnosis not present

## 2017-11-21 DIAGNOSIS — I25810 Atherosclerosis of coronary artery bypass graft(s) without angina pectoris: Secondary | ICD-10-CM

## 2017-11-21 DIAGNOSIS — I481 Persistent atrial fibrillation: Secondary | ICD-10-CM | POA: Diagnosis not present

## 2017-11-21 NOTE — Patient Instructions (Signed)

## 2017-11-21 NOTE — Progress Notes (Signed)
Chief Complaint  Patient presents with  . Follow-up    CAD     History of Present Illness: 77 yo female with history of CAD with CABG 2009, ischemic cardiomyopathy, persistent atrial fibrillation, chronic systolic CHF, HLD, DM, CKD here today for cardiac follow up. She has had persistent atrial fibrillation and was treated with coumadin but INR was difficult to control. She presented with a CVA may 2017. She was started on Eliquis in June 2017. Echo May 2017 with LVEF=35-40%, mild MR. She has been on a beta blocker but has not been on Ace-inh or ARB due to renal insufficiency and hypotension. She is also followed in [facility] clinic by Dr. Audie Clear. She has been off of ASA due to use of Eliquis.  She has struggled with volume overload over the past six months. Echo 09/30/17 with LVEF=35-40%, severe RV dilation, severe TR and elevated PA pressures. She is now followed in the CHF clinic. She is now on Torsemide and doing much better. Last visit in CHF clinic 11/19/17.   She is here today for follow up. The patient denies any chest pain, dyspnea, palpitations,  lower extremity edema, orthopnea, PND, dizziness, near syncope or syncope.   Primary Care Physician: Carlyle Dolly, MD  Past Medical History:  Diagnosis Date  . ACC/AHA stage C congestive heart failure due to ischemic cardiomyopathy ([place]) 01/31/2009   Qualifier: Diagnosis of  By: Olevia Perches, MD, Glenetta Hew   . ANXIETY 01/31/2009   Qualifier: Diagnosis of  By: Lovette Cliche, CNA, Christy    . Arthritis    "qwhere" (03/30/2016)  . Benign essential HTN   . CAD (coronary artery disease) 05/2008   a. s/p CABG in 2009.  Marland Kitchen Cerebrovascular accident (CVA) due to embolism of right middle cerebral artery ([place])   . Chronic anticoagulation   . Chronic kidney disease (CKD), stage III (moderate) (HCC)   . Chronic lower back pain   . Chronic pain syndrome   . Chronic systolic CHF (congestive heart failure) ([place])   . DM type 2 with  diabetic peripheral neuropathy ([place])   . Facial weakness, post-stroke   . Gastrointestinal hemorrhage 03/26/2017  . Hemiparesis ([place])   . History of CVA with residual deficit 03/26/2017  . Hyperlipidemia   . Hypertension   . Ischemic cardiomyopathy   . Longstanding persistent atrial fibrillation ([place])    a. Postoperative in 2009;  S/P transesophageal echocardiography-guided cardioversion, previously on Amiodarone and Coumadin. b. recurrent in April 2013 -> pt elected rate control strategy, initially Coumadin -> had stroke with subtherapeutic INR in 03/2016 and transitioned to Eliquis.  . Morbid obesity ([place]) 03/26/2017  . Persistent atrial fibrillation ([place])   . Thrombocytopenia ([place])     Past Surgical History:  Procedure Laterality Date  . APPENDECTOMY  1946  . CATARACT EXTRACTION Right 2012  . COLONOSCOPY WITH PROPOFOL Left 03/27/2017   Procedure: COLONOSCOPY WITH PROPOFOL;  Surgeon: Ronnette Juniper, MD;  Location: [facility];  Service: Gastroenterology;  Laterality: Left;  . CORONARY ANGIOPLASTY WITH STENT PLACEMENT  2009   "put 2 stents in"  . HERNIA REPAIR    . JOINT REPLACEMENT    . TEE WITH CARDIOVERSION     Postoperative in 2009;  S/P transesophageal echocardiography-guided cardioversion,  . TOTAL KNEE ARTHROPLASTY Right 1994  . VENTRAL HERNIA REPAIR  10/1999   With mesh    Current Outpatient Medications  Medication Sig Dispense Refill  . atorvastatin (LIPITOR) 40 MG tablet Take 1 tablet (40 mg  total) by mouth daily at 6 PM. 90 tablet 1  . carvedilol (COREG) 6.25 MG tablet Take 1 tablet (6.25 mg total) by mouth 2 (two) times daily with a meal. 60 tablet 6  . cholecalciferol (VITAMIN D) 1000 UNITS tablet Take 1,000 Units by mouth 2 (two) times daily.    Marland Kitchen docusate sodium (COLACE) 100 MG capsule Take 200 mg by mouth at bedtime.     Marland Kitchen ELIQUIS 5 MG TABS tablet TAKE 1 TABLET BY MOUTH TWICE DAILY 60 tablet 0  . gabapentin (NEURONTIN) 400 MG capsule Take 400 mg by mouth 2 (two) times  daily.    . insulin glargine (LANTUS) 100 UNIT/ML injection Inject 0.23 mLs (23 Units total) into the skin at bedtime. 10 mL 2  . metolazone (ZAROXOLYN) 2.5 MG tablet Take 1 tablet (2.5 mg total) by mouth daily as needed. Take extra 20 meq potassium tab. Must call CHF clinic before taking ([phone]) 5 tablet 0  . nitroGLYCERIN (NITROSTAT) 0.4 MG SL tablet Place 0.4 mg under the tongue every 5 (five) minutes as needed for chest pain (up to 3 doses).     . potassium chloride SA (K-DUR,KLOR-CON) 20 MEQ tablet Take 20 mEq by mouth 3 (three) times daily.     Marland Kitchen rOPINIRole (REQUIP) 0.5 MG tablet TAKE 2 TABLETS BY MOUTH AT  BEDTIME 60 tablet 1  . torsemide (DEMADEX) 20 MG tablet Take 3 tablets (60 mg total) by mouth 2 (two) times daily. 180 tablet 0  . traMADol (ULTRAM) 50 MG tablet TAKE 2 TABLETS BY MOUTH EVERY MORNING AND TAKE 1 TABLET AT NIGHT 90 tablet 0  . vitamin B-12 (CYANOCOBALAMIN) 1000 MCG tablet Take 1 tablet (1,000 mcg total) by mouth daily. 90 tablet 0   No current facility-administered medications for this visit.     Allergies  Allergen Reactions  . Benazepril Other (See Comments)    Unknown reaction at age 27-65 - possibly dizziness  . Angiotensin Receptor Blockers Other (See Comments)    Hypotension reaction  . Fe-Succ-C-Thre-B12-Des Stomach Other (See Comments)    Unknown reaction  . Spironolactone Other (See Comments)    Dizziness   . Sulfamethoxazole-Trimethoprim Other (See Comments)    Unknown reaction    Social History   Socioeconomic History  . Marital status: Widowed    Spouse name: Not on file  . Number of children: Not on file  . Years of education: Not on file  . Highest education level: Not on file  Social Needs  . Financial resource strain: Not on file  . Food insecurity - worry: Not on file  . Food insecurity - inability: Not on file  . Transportation needs - medical: Not on file  . Transportation needs - non-medical: Not on file  Occupational  History  . Not on file  Tobacco Use  . Smoking status: Never Smoker  . Smokeless tobacco: Never Used  Substance and Sexual Activity  . Alcohol use: No  . Drug use: No  . Sexual activity: Not Currently  Other Topics Concern  . Not on file  Social History Narrative   Widowed   Lives alone in [place]   2 children   Not routinely exercising    Family History  Problem Relation Age of Onset  . Clotting disorder Mother        Cerebral hemorrhage  . Emphysema Father        COD  . Lung cancer Brother   . Heart disease Neg Hx  Review of Systems:  As stated in the HPI and otherwise negative.   BP 124/70   Pulse 72   Ht 5\' 9"  (1.753 m)   Wt 290 lb 3.2 oz (131.6 kg)   LMP  (LMP Unknown)   SpO2 99%   BMI 42.86 kg/m   Physical Examination:  General: Well developed, well nourished, NAD  HEENT: OP clear, mucus membranes moist  SKIN: warm, dry. No rashes. Neuro: No focal deficits  Musculoskeletal: Muscle strength 5/5 all ext  Psychiatric: Mood and affect normal  Neck: No JVD, no carotid bruits, no thyromegaly, no lymphadenopathy.  Lungs:Clear bilaterally, no wheezes, rhonci, crackles Cardiovascular: Irreg irreg No murmurs, gallops or rubs. Abdomen:Soft. Bowel sounds present. Non-tender.  Extremities: Trace bilateral lower extremity edema. Pulses are 2 + in the bilateral DP/PT.  Echo 09/30/17: - Left ventricle: The cavity size was normal. Wall thickness was   normal. Systolic function was moderately reduced. The estimated   ejection fraction was in the range of 35% to 40%. Diffuse   hypokinesis. There was no evidence of elevated ventricular   filling pressure by Doppler parameters. - Ventricular septum: Septal motion showed abnormal function and   dyssynergy. - Mitral valve: Moderately calcified annulus. There was mild   regurgitation directed posteriorly. - Left atrium: The atrium was mildly dilated. Volume/bsa, ES,   (1-plane Simpson&'s, A2C): 40.3 ml/m^2. - Right  ventricle: The cavity size was severely dilated. Wall   thickness was normal. - Right atrium: The atrium was severely dilated. - Tricuspid valve: There was severe regurgitation. - Pulmonary arteries: Systolic pressure was mildly increased. PA   peak pressure: 42 mm Hg (S).  Impressions: - Compared to the prior study, there has been no significant   interval change  EKG:  EKG is not  ordered today. The ekg ordered today demonstrates   Recent Labs: 03/25/2017: ALT 14; B Natriuretic Peptide 149.9 07/19/2017: Hemoglobin 10.1; Platelets 113 11/19/2017: BUN 27; Creatinine, Ser 1.66; Potassium 3.4; Sodium 137     Wt Readings from Last 3 Encounters:  11/21/17 290 lb 3.2 oz (131.6 kg)  11/19/17 284 lb 4 oz (128.9 kg)  10/23/17 285 lb 3.2 oz (129.4 kg)     Other studies Reviewed: Additional studies/ records that were reviewed today include: . Review of the above records demonstrates:    Assessment and Plan:   1. Atrial fibrillation, persistent: rate controlled on beta blocker. Continue Eliquis.    2. CAD without angina: She is having no chest pain suggestive of angina. Will continue ASA, statin and beta blocker.   3. Ischemic cardiomyopathy/Chronic systolic CHF: LVEF 13-08% by most recent echo. She is only on a beta blocker. No ARB given hypotension and renal insufficiency. She is followed in the CHF clinic closely. Her volume status is near baseline today.  Continue torsemide 60 mg po BID with metolazone as needed for weight gain. Continue Coreg.    4. HTN: BP is well controlled. No changes.   Current medicines are reviewed at length with the patient today.  The patient does not have concerns regarding medicines.  The following changes have been made:  no change  Labs/ tests ordered today include:   No orders of the defined types were placed in this encounter.   Disposition:   FU with me in 12  months  Signed, Lauree Chandler, MD 11/21/2017 3:16 PM    [facility] [address] [place], [place], [place]  [address] Phone: [phone]; Fax: [phone]

## 2017-11-22 ENCOUNTER — Telehealth (HOSPITAL_COMMUNITY): Payer: Self-pay | Admitting: Cardiology

## 2017-11-22 MED ORDER — POTASSIUM CHLORIDE CRYS ER 20 MEQ PO TBCR: EXTENDED_RELEASE_TABLET | ORAL | 6 refills | Status: DC

## 2017-11-22 NOTE — Telephone Encounter (Signed)
-----   Message from Shirley Friar, PA-C sent at 11/20/2017  7:28 AM EST ----- Please increase K to 40 meq q am and 20 meq q pm.   Thanks!   Legrand Como [address] [place], PA-C 11/20/2017 7:28 AM

## 2017-11-22 NOTE — Telephone Encounter (Signed)
Patient aware.

## 2017-11-24 ENCOUNTER — Encounter: Payer: Self-pay | Admitting: Cardiology

## 2017-11-24 DIAGNOSIS — G4733 Obstructive sleep apnea (adult) (pediatric): Secondary | ICD-10-CM | POA: Insufficient documentation

## 2017-11-24 DIAGNOSIS — Z9989 Dependence on other enabling machines and devices: Principal | ICD-10-CM

## 2017-11-24 NOTE — Progress Notes (Signed)
Cardiology Office Note:    Date:  11/25/2017   ID:  [name], DOB 1941-07-17, MRN [id number]  PCP:  Carlyle Dolly, MD  Cardiologist:  No primary care provider on file.    Referring MD: Carlyle Dolly, MD   Chief Complaint  Patient presents with  . Sleep Apnea  . Hypertension    History of Present Illness:    [name] is a 77 y.o. female with a hx of CHF with ischemic DCM, ASCAD, HTN who underwent home sleep study 07/2017 showing severe OSA with an AHI of 34/hr.  This was ordered due to excessive daytime sleepiness and snoring.  She subsequently underwent CPAP titration and is now on CPAP.  She is doing well with her CPAP device and thinks that She has gotten used to it.  She tolerates the nasal pillow mask with no chin strap and feels the pressure is adequate.  She says that she does not think that the machine is working right because she says that it is only recording 10 minutes of usage nightly.  She has not used her device in 1 month because she does not think it is working.  When using the CPAP she feels rested in the am and has no significant daytime sleepiness.  She has significant mouth.dryness.  She does not think that he snores.     Past Medical History:  Diagnosis Date  . ACC/AHA stage C congestive heart failure due to ischemic cardiomyopathy ([place]) 01/31/2009   Qualifier: Diagnosis of  By: Olevia Perches, MD, Glenetta Hew   . ANXIETY 01/31/2009   Qualifier: Diagnosis of  By: Lovette Cliche, CNA, Christy    . Arthritis    "qwhere" (03/30/2016)  . Benign essential HTN   . CAD (coronary artery disease) 05/2008   a. s/p CABG in 2009.  Marland Kitchen Cerebrovascular accident (CVA) due to embolism of right middle cerebral artery ([place])   . Chronic anticoagulation   . Chronic kidney disease (CKD), stage III (moderate) (HCC)   . Chronic lower back pain   . Chronic pain syndrome   . Chronic systolic CHF (congestive heart failure) ([place])   . DM type 2 with diabetic peripheral  neuropathy ([place])   . Facial weakness, post-stroke   . Gastrointestinal hemorrhage 03/26/2017  . Hemiparesis ([place])   . History of CVA with residual deficit 03/26/2017  . Hyperlipidemia   . Hypertension   . Ischemic cardiomyopathy   . Longstanding persistent atrial fibrillation ([place])    a. Postoperative in 2009;  S/P transesophageal echocardiography-guided cardioversion, previously on Amiodarone and Coumadin. b. recurrent in April 2013 -> pt elected rate control strategy, initially Coumadin -> had stroke with subtherapeutic INR in 03/2016 and transitioned to Eliquis.  . Morbid obesity ([place]) 03/26/2017  . OSA on CPAP    severe OSA with AHI 34/hr  . Persistent atrial fibrillation ([place])   . Thrombocytopenia ([place])     Past Surgical History:  Procedure Laterality Date  . APPENDECTOMY  1946  . CATARACT EXTRACTION Right 2012  . COLONOSCOPY WITH PROPOFOL Left 03/27/2017   Procedure: COLONOSCOPY WITH PROPOFOL;  Surgeon: Ronnette Juniper, MD;  Location: [facility];  Service: Gastroenterology;  Laterality: Left;  . CORONARY ANGIOPLASTY WITH STENT PLACEMENT  2009   "put 2 stents in"  . HERNIA REPAIR    . JOINT REPLACEMENT    . TEE WITH CARDIOVERSION     Postoperative in 2009;  S/P transesophageal echocardiography-guided cardioversion,  . TOTAL KNEE ARTHROPLASTY Right 1994  .  VENTRAL HERNIA REPAIR  10/1999   With mesh    Current Medications: Current Meds  Medication Sig  . atorvastatin (LIPITOR) 40 MG tablet Take 1 tablet (40 mg total) by mouth daily at 6 PM.  . carvedilol (COREG) 6.25 MG tablet Take 1 tablet (6.25 mg total) by mouth 2 (two) times daily with a meal.  . cholecalciferol (VITAMIN D) 1000 UNITS tablet Take 1,000 Units by mouth 2 (two) times daily.  Marland Kitchen docusate sodium (COLACE) 100 MG capsule Take 200 mg by mouth at bedtime.   Marland Kitchen ELIQUIS 5 MG TABS tablet TAKE 1 TABLET BY MOUTH TWICE DAILY  . gabapentin (NEURONTIN) 400 MG capsule Take 400 mg by mouth 2 (two) times daily.  . insulin  glargine (LANTUS) 100 UNIT/ML injection Inject 0.23 mLs (23 Units total) into the skin at bedtime.  . metolazone (ZAROXOLYN) 2.5 MG tablet Take 1 tablet (2.5 mg total) by mouth daily as needed. Take extra 20 meq potassium tab. Must call CHF clinic before taking ([phone])  . nitroGLYCERIN (NITROSTAT) 0.4 MG SL tablet Place 0.4 mg under the tongue every 5 (five) minutes as needed for chest pain (up to 3 doses).   . potassium chloride SA (K-DUR,KLOR-CON) 20 MEQ tablet Take 2 tablets (40 mEq total) by mouth every morning AND 1 tablet (20 mEq total) every evening.  Marland Kitchen rOPINIRole (REQUIP) 0.5 MG tablet TAKE 2 TABLETS BY MOUTH AT  BEDTIME  . torsemide (DEMADEX) 20 MG tablet Take 3 tablets (60 mg total) by mouth 2 (two) times daily.  . traMADol (ULTRAM) 50 MG tablet TAKE 2 TABLETS BY MOUTH EVERY MORNING AND TAKE 1 TABLET AT NIGHT  . vitamin B-12 (CYANOCOBALAMIN) 1000 MCG tablet Take 1 tablet (1,000 mcg total) by mouth daily.     Allergies:   Benazepril; Angiotensin receptor blockers; Fe-succ-c-thre-b12-des stomach; Spironolactone; and Sulfamethoxazole-trimethoprim   Social History   Socioeconomic History  . Marital status: Widowed    Spouse name: None  . Number of children: None  . Years of education: None  . Highest education level: None  Social Needs  . Financial resource strain: None  . Food insecurity - worry: None  . Food insecurity - inability: None  . Transportation needs - medical: None  . Transportation needs - non-medical: None  Occupational History  . None  Tobacco Use  . Smoking status: Never Smoker  . Smokeless tobacco: Never Used  Substance and Sexual Activity  . Alcohol use: No  . Drug use: No  . Sexual activity: Not Currently  Other Topics Concern  . None  Social History Narrative   Widowed   Lives alone in [place]   2 children   Not routinely exercising     Family History: The patient's family history includes Clotting disorder in her mother; Emphysema in  her father; Lung cancer in her brother. There is no history of Heart disease.  ROS:   Please see the history of present illness.    ROS  All other systems reviewed and negative.   EKGs/Labs/Other Studies Reviewed:    The following studies were reviewed today: CPAP download  EKG:  EKG is not ordered today.    Recent Labs: 03/25/2017: ALT 14; B Natriuretic Peptide 149.9 07/19/2017: Hemoglobin 10.1; Platelets 113 11/19/2017: BUN 27; Creatinine, Ser 1.66; Potassium 3.4; Sodium 137   Recent Lipid Panel    Component Value Date/Time   CHOL 102 03/30/2016 0740   TRIG 50 03/30/2016 0740   HDL 39 (L) 03/30/2016 0740   CHOLHDL  2.6 03/30/2016 0740   VLDL 10 03/30/2016 0740   LDLCALC 53 03/30/2016 0740    Physical Exam:    VS:  BP 114/60   Pulse 70   Ht 5\' 9"  (1.753 m)   Wt 283 lb 6.4 oz (128.5 kg)   LMP  (LMP Unknown)   SpO2 99%   BMI 41.85 kg/m     Wt Readings from Last 3 Encounters:  11/25/17 283 lb 6.4 oz (128.5 kg)  11/21/17 290 lb 3.2 oz (131.6 kg)  11/19/17 284 lb 4 oz (128.9 kg)     GEN:  Well nourished, well developed in no acute distress HEENT: Normal NECK: No JVD; No carotid bruits LYMPHATICS: No lymphadenopathy CARDIAC: RRR, no murmurs, rubs, gallops RESPIRATORY:  Clear to auscultation without rales, wheezing or rhonchi  ABDOMEN: Soft, non-tender, non-distended MUSCULOSKELETAL:  No edema; No deformity  SKIN: Warm and dry NEUROLOGIC:  Alert and oriented x 3 PSYCHIATRIC:  Normal affect   ASSESSMENT:    1. Benign essential HTN   2. OSA on CPAP   3. Morbid obesity ([place])    PLAN:    In order of problems listed above:  1.  OSA - the patient is tolerating PAP therapy but has not been compliant in the past month due to her machine not working correctly.  She says that she has left multiple messages with the DME and has not received a call back. The PAP download was reviewed today and showed an AHI of 24.1/hr on autoCPAP with 23% compliance in using more than 4  hours nightly.  The patient has been using and benefiting from PAP when she uses it but unfortunately her AHI is too high and I suspect she is leaking air out through her mouth with mouth breathing.  At this time I have recommended that she get an in lab titration with nasal pillow mask and chin strap as she is claustrophobic and does not think she will be able to use a full face mask.   2.  HTN - BP is well controlled on exam today.  She will continue on Carvedilol 6.25mg  BID.    3.  Morbid obesity - I have encouraged her to get into a routine exercise program and cut back on carbs and portions.   Medication Adjustments/Labs and Tests Ordered: Current medicines are reviewed at length with the patient today.  Concerns regarding medicines are outlined above.  No orders of the defined types were placed in this encounter.  No orders of the defined types were placed in this encounter.   Signed, Fransico Him, MD  11/25/2017 7:52 AM    [facility]

## 2017-11-25 ENCOUNTER — Ambulatory Visit: Payer: Medicare Other | Admitting: Cardiology

## 2017-11-25 ENCOUNTER — Encounter (INDEPENDENT_AMBULATORY_CARE_PROVIDER_SITE_OTHER): Payer: Self-pay

## 2017-11-25 ENCOUNTER — Other Ambulatory Visit: Payer: Self-pay | Admitting: Family Medicine

## 2017-11-25 ENCOUNTER — Encounter: Payer: Self-pay | Admitting: Cardiology

## 2017-11-25 ENCOUNTER — Telehealth: Payer: Self-pay | Admitting: *Deleted

## 2017-11-25 VITALS — BP 114/60 | HR 70 | Ht 69.0 in | Wt 283.4 lb

## 2017-11-25 DIAGNOSIS — I10 Essential (primary) hypertension: Secondary | ICD-10-CM | POA: Diagnosis not present

## 2017-11-25 DIAGNOSIS — G4733 Obstructive sleep apnea (adult) (pediatric): Secondary | ICD-10-CM

## 2017-11-25 DIAGNOSIS — Z9989 Dependence on other enabling machines and devices: Principal | ICD-10-CM

## 2017-11-25 NOTE — Telephone Encounter (Signed)
Sent to sleep pool for CPAP titration

## 2017-11-25 NOTE — Telephone Encounter (Signed)
-----   Message from [place], [place] sent at 11/25/2017  8:05 AM EST ----- Regarding: cpap titration  Per Dr. Radford Pax patient needs in lab CPAP titration ASAP. Please confirm if patient's DME cpap service is with [facility]. Patient has not used cpap in a month.   Thanks  [employer]

## 2017-11-25 NOTE — Patient Instructions (Signed)
Medication Instructions:  Your physician recommends that you continue on your current medications as directed. Please refer to the Current Medication list given to you today.  If you need a refill on your cardiac medications, please contact your pharmacy first.  Labwork: None ordered   Testing/Procedures: None ordered   Follow-Up: Your physician recommends that you schedule a follow-up appointment in: 3 months with Dr. Radford Pax.   Any Other Special Instructions Will Be Listed Below (If Applicable).     If you need a refill on your cardiac medications before your next appointment, please call your pharmacy.

## 2017-11-26 DIAGNOSIS — R300 Dysuria: Secondary | ICD-10-CM | POA: Insufficient documentation

## 2017-11-26 HISTORY — DX: Dysuria: R30.0

## 2017-11-26 NOTE — Telephone Encounter (Signed)
Error

## 2017-11-27 DIAGNOSIS — R300 Dysuria: Secondary | ICD-10-CM | POA: Diagnosis not present

## 2017-11-28 ENCOUNTER — Encounter: Payer: Self-pay | Admitting: Cardiology

## 2017-11-29 NOTE — Addendum Note (Signed)
Addended by: Freada Bergeron on: 11/29/2017 02:22 PM   Modules accepted: Orders

## 2017-11-29 NOTE — Telephone Encounter (Signed)
[name], CMA        Patient can be scheduled. Please let me know and I will enter precert info.  Thanks,  Amy

## 2017-12-04 DIAGNOSIS — G4733 Obstructive sleep apnea (adult) (pediatric): Secondary | ICD-10-CM | POA: Diagnosis not present

## 2017-12-06 ENCOUNTER — Other Ambulatory Visit (HOSPITAL_COMMUNITY): Payer: Self-pay | Admitting: Internal Medicine

## 2017-12-16 ENCOUNTER — Ambulatory Visit (HOSPITAL_BASED_OUTPATIENT_CLINIC_OR_DEPARTMENT_OTHER): Payer: Medicare Other | Attending: Cardiology | Admitting: Cardiology

## 2017-12-16 VITALS — Ht 69.0 in | Wt 267.0 lb

## 2017-12-16 DIAGNOSIS — I4891 Unspecified atrial fibrillation: Secondary | ICD-10-CM | POA: Insufficient documentation

## 2017-12-16 DIAGNOSIS — Z9989 Dependence on other enabling machines and devices: Secondary | ICD-10-CM

## 2017-12-16 DIAGNOSIS — Z79899 Other long term (current) drug therapy: Secondary | ICD-10-CM | POA: Insufficient documentation

## 2017-12-16 DIAGNOSIS — G4733 Obstructive sleep apnea (adult) (pediatric): Secondary | ICD-10-CM | POA: Diagnosis not present

## 2017-12-18 NOTE — Procedures (Signed)
NAME: [name] DATE OF BIRTH:  11-01-1941 MEDICAL RECORD NUMBER [id number]  LOCATION: [facility]  PHYSICIAN: Skylyn Slezak  DATE OF STUDY: 12/16/2017  SLEEP STUDY TYPE: Positive Airway Pressure Titration               REFERRING PHYSICIAN: Quintella Reichert, MD    Gender: Female D.O.B: 1941-11-03 Age (years): 48 Referring Provider: Armanda Magic MD, ABSM Height (inches): 69 Interpreting Physician: Armanda Magic MD, ABSM Weight (lbs): 267 RPSGT: Lowry Ram BMI: 39 MRN: [id number] Neck Size: 14.75  CLINICAL INFORMATION The patient is referred for a CPAP titration to treat sleep apnea.  SLEEP STUDY TECHNIQUE As per the AASM Manual for the Scoring of Sleep and Associated Events v2.3 (April 2016) with a hypopnea requiring 4% desaturations.  The channels recorded and monitored were frontal, central and occipital EEG, electrooculogram (EOG), submentalis EMG (chin), nasal and oral airflow, thoracic and abdominal wall motion, anterior tibialis EMG, snore microphone, electrocardiogram, and pulse oximetry. Continuous positive airway pressure (CPAP) was initiated at the beginning of the study and titrated to treat sleep-disordered breathing.  MEDICATIONS Medications self-administered by patient taken the night of the study : ATORVASTATIN, CARVEDILOL, ELIQUIS, GABAPENTIN, ROPINIROLE, TRAMADOL  TECHNICIAN COMMENTS Comments added by technician: Patient was restless all through the night. Comments added by scorer: N/A  RESPIRATORY PARAMETERS Optimal PAP Pressure (cm): N/A AHI at Optimal Pressure (/hr):N/A Overall Minimal O2 (%):88.00  Supine % at Optimal Pressure (%):N/A Minimal O2 at Optimal Pressure (%): 88.00   SLEEP ARCHITECTURE The study was initiated at 10:17:40 PM and ended at 4:35:11 AM.  Sleep onset time was 19.4 minutes and the sleep efficiency was 25.8%. The total sleep time was 97.5 minutes.  The patient spent 14.36% of the night in stage  N1 sleep, 85.64% in stage N2 sleep, 0.00% in stage N3 and 0.00% in REM.Stage REM latency was N/A minutes  Wake after sleep onset was 260.7. Alpha intrusion was absent. Supine sleep was 100.00%.  CARDIAC DATA The 2 lead EKG demonstrated atrial fibrillation. The mean heart rate was 73.79 beats per minute. Other EKG findings include: PVCs.  LEG MOVEMENT DATA The total Periodic Limb Movements of Sleep (PLMS) were 1. The PLMS index was 0.62. A PLMS index of <15 is considered normal in adults.  IMPRESSIONS - An optimal PAP pressure could not be selected for this patient based on the available study data. - Central sleep apnea was not noted during this titration (CAI = 3.1/h). - Mild oxygen desaturations were observed during this titration (min O2 = 88.00%). - No snoring was audible during this study. - 2-lead EKG demonstrated: PVCs and atrial fibrillatio - Clinically significant periodic limb movements were not noted during this study. Arousals associated with PLMs were rare.  DIAGNOSIS - Obstructive Sleep Apnea (327.23 [G47.33 ICD-10]) - Atrial Fibrillation  RECOMMENDATIONS - Recommend a trial of Auto-BiPAP 6-18 cm H2O. Patient did not tolerate CPAP. - Avoid alcohol, sedatives and other CNS depressants that may worsen sleep apnea and disrupt normal sleep architecture. - Sleep hygiene should be reviewed to assess factors that may improve sleep quality. - Weight management and regular exercise should be initiated or continued. - Return to Sleep Center for re-evaluation after 10 weeks of therapy  Armanda Magic Diplomate, American Board of Sleep Medicine  ELECTRONICALLY SIGNED ON:  12/18/2017, 8:03 PM [facility] PH: [phone]   FX: [phone] ACCREDITED BY THE AMERICAN ACADEMY OF SLEEP MEDICINE

## 2017-12-23 ENCOUNTER — Other Ambulatory Visit: Payer: Self-pay | Admitting: Family Medicine

## 2017-12-27 ENCOUNTER — Other Ambulatory Visit (HOSPITAL_COMMUNITY): Payer: Self-pay

## 2017-12-27 MED ORDER — TORSEMIDE 20 MG PO TABS: 60.0000 mg | ORAL_TABLET | Freq: Two times a day (BID) | ORAL | 3 refills | Status: DC

## 2017-12-30 ENCOUNTER — Telehealth: Payer: Self-pay | Admitting: *Deleted

## 2017-12-30 NOTE — Telephone Encounter (Signed)
Informed patient of titration results and verbalized understanding was indicated. Patient understands she was not able to be adequately titrated on CPAP due to ongoing respiratory events and lack of sleep. Patient understands she will be set up on auto CPAP from 6 to 18cm H2O. She understands she will receive a call from [facility] after she has received her new settings to schedule 10 week follow up appointment.  [facility] notified of new cpap order  Please add to airview She was grateful for the call and thanked me

## 2017-12-30 NOTE — Telephone Encounter (Signed)
-----   Message from Sueanne Margarita, MD sent at 12/18/2017  8:07 PM EST ----- Please let patient know that she was not able to be adequately titrated on CPAP due to ongoing respiratory events and lack of sleep.  Patient will be set up on auto CPAP from 6 to 18cm H2O.  Please set up OV with me in 10 weeks

## 2017-12-31 ENCOUNTER — Encounter: Payer: Self-pay | Admitting: Neurology

## 2017-12-31 ENCOUNTER — Ambulatory Visit: Payer: Medicare Other | Admitting: Neurology

## 2017-12-31 VITALS — BP 118/72 | HR 68 | Wt 273.2 lb

## 2017-12-31 DIAGNOSIS — I481 Persistent atrial fibrillation: Secondary | ICD-10-CM

## 2017-12-31 DIAGNOSIS — R600 Localized edema: Secondary | ICD-10-CM

## 2017-12-31 DIAGNOSIS — I4819 Other persistent atrial fibrillation: Secondary | ICD-10-CM

## 2017-12-31 DIAGNOSIS — I63411 Cerebral infarction due to embolism of right middle cerebral artery: Secondary | ICD-10-CM | POA: Diagnosis not present

## 2017-12-31 DIAGNOSIS — G4733 Obstructive sleep apnea (adult) (pediatric): Secondary | ICD-10-CM | POA: Diagnosis not present

## 2017-12-31 DIAGNOSIS — Z9989 Dependence on other enabling machines and devices: Secondary | ICD-10-CM | POA: Diagnosis not present

## 2017-12-31 DIAGNOSIS — E1142 Type 2 diabetes mellitus with diabetic polyneuropathy: Secondary | ICD-10-CM | POA: Diagnosis not present

## 2017-12-31 DIAGNOSIS — E785 Hyperlipidemia, unspecified: Secondary | ICD-10-CM

## 2017-12-31 DIAGNOSIS — I10 Essential (primary) hypertension: Secondary | ICD-10-CM

## 2017-12-31 NOTE — Progress Notes (Signed)
STROKE NEUROLOGY FOLLOW UP NOTE  NAME: [name] DOB: Jun 10, 1941  REASON FOR VISIT: stroke follow up HISTORY FROM: pt and chart  Today we had the pleasure of seeing [name] in follow-up at our [facility]rology [facility]. Pt was accompanied by son.   History Summary [name] is a 77 y.o. female with history of CAD/MI s/p CABG, ischemic cardiomyopathy, CHF, atrial fibrillation on coumadin therapy, HLD, obesity, HTN, DM, previous TIA, and CKD admitted on 04/01/16 for left facial weakness, slurred speech in the setting of subtherapeutic INR. MRI showed Acute/subacute infarcts involve the right caudate head, lentiform nucleus, insular cortex, and portions of the right temporal lobe. Hemorrhagic conversion is noted within the right basal ganglia infarcts. CTA head and neck showed right proximal M2 occlusion with distal reconstitution. And CT repeat showed stable hematoma. TTE EF 35-40% down from previous 40-45%. A1C 8.1 and LDL 53. Her ASA and coumadin were on hold due to hemorrhagic transformation. She was discharged to CIR with zocor and plan to start eliquis in 7 days with repeat CT.   06/21/16 follow up - the patient has been doing well. Stayed in CIR for 2 weeks and repeat CT head stable evolving hematoma at right BG and caudate head. eliquis 5mg  bid started in CIR but not ASA. She followed with cardiology later and kept on eliquis but did not mention ASA. Currently, pt left sided weakness resolved, walk with walker. BP 102/61 and glucose at home 110-120 and recent A1C 6.6.   10/22/17 follow up - she has been doing well. No recurrent stroke like symptoms. Followed with cardiology and added ASA 81mg  in addition to eliquis for cardiac prevention. BP today 138/75 and she said her BP at home stable about the same. Her glucose at home ranging from 100-120. She still has bilateral hand tingling from DM neuropathy and on neurontin 400mg  Qhs. She is going to follow up with her PCP.   05/07/17  follow up - During the interval time, pt has been doing well from stroke standpoint. No stroke like symptoms. However, she had rectal bleeding in 03/2017 and was admitted for blood transfusion. Her ASA and eliquis were stopped and further work up showed abdominal mass s/p needle biopsy turned out to be benign smooth muscle cyst. Her eliquis was resumed but not ASA. She is about to see her cardiologist to discuss about ASA. Most recent Hb 10.1. BP today 107/59 and as per pt her most recent A1C was 6.2. She is walking with walker at home.   Interval History  Patient is being seen today for six-month follow-up and is accompanied by her son.  Patient states that she continues to have balance issues but does currently use a rolling walker to help with this.  Patient denies falls.  Patient states she continues to take Eliquis for her atrial fibrillation and she is tolerating this well without side effects of increase in bleeding or bruising.  Patient also states that she continues to take her Lipitor for cholesterol control which she denies myalgias.  Patient states her last A1c was 6.6.  Patient does have history of OSA and is compliant with CPAP machine nightly.  Patient also states that she has currently lost 53 pounds.  Patient currently lives with son.  Patient denies new or worsening TIA/stroke symptoms.   REVIEW OF SYSTEMS: Full 14 system review of systems performed and notable only for those listed below and in HPI above, all others are negative: Ringing  in ears, restless leg, apnea, back pain, muscle cramps   The following represents the patient's updated allergies and side effects list: Allergies  Allergen Reactions  . Benazepril Other (See Comments)    Unknown reaction at age 48-65 - possibly dizziness  . Angiotensin Receptor Blockers Other (See Comments)    Hypotension reaction  . Fe-Succ-C-Thre-B12-Des Stomach Other (See Comments)    Unknown reaction  . Spironolactone Other (See Comments)     Dizziness   . Sulfamethoxazole-Trimethoprim Other (See Comments)    Unknown reaction    The neurologically relevant items on the patient's problem list were reviewed on today's visit.  Neurologic Examination  A problem focused neurological exam (12 or more points of the single system neurologic examination, vital signs counts as 1 point, cranial nerves count for 8 points) was performed.  Blood pressure 118/72, pulse 68, weight 273 lb 3.2 oz (123.9 kg).  General - obese, well developed, elderly Caucasian female, in no apparent distress.  Ophthalmologic - Fundoscopic exam reveals sharp disc margins. Pupils equal, briskly reactive to light. Extraocular movements full without nystagmus. Visual fields full to confrontation.   Cardiovascular - irregularly irregular heart rate and rhythm, +3 pitting edema BLE  Mental Status -  Level of arousal and orientation to time, place, and person were intact. Language including expression, naming, repetition, comprehension was assessed and found intact. Fund of Knowledge was assessed and was intact.  Cranial Nerves II - XII - II - Visual field intact OU. III, IV, VI - Extraocular movements intact. V - Facial sensation intact bilaterally. VII - Facial movement intact bilaterally. VIII - Hearing & vestibular intact bilaterally. X - Palate elevates symmetrically. XI - Chin turning & shoulder shrug intact bilaterally. XII - Tongue protrusion intact.  Motor Strength - The patient's strength was 5/5 BUEs and 4/5 BLEs and pronator drift was absent.  Bulk was normal and fasciculations were absent.   Motor Tone - Muscle tone was assessed at the neck and appendages and was normal.  Reflexes - The patient's reflexes were 1+ in all extremities and she had no pathological reflexes.  Sensory - Light touch, temperature/pinprick were assessed and were normal.    Coordination - The patient had normal movements in the hands with no ataxia or dysmetria.  Tremor  was absent.  Gait and Station - walk with walker, slow but steady.   Data reviewed: I personally reviewed the images and agree with the radiology interpretations.  Transthoracic echo 09/30/2017 EF 35-40% Compared to prior study, there has been no significant interval change.  Ct Angio Head and neck W/cm &/or Wo Cm 03/30/2016   1. Right proximal M2 occlusion with distal reconstitution. There is completed infarct in the right caudate, putamen, internal capsule, and posterior insula.  2. No flow limiting stenosis or embolic source identified in the neck.   Ct Head Wo Contrast 03/30/2016   1. Focal hypodensity within the right basal ganglia, highly suspicious for evolving ischemia. Possible extension into the right subinsular region and insular cortex may be present as well. No associated hemorrhage.  2. Asymmetric hyperdensity within the distal right M1 segment, which may reflect intraluminal thrombus versus atheromatous plaque.  3. Remote pontine infarct.  4. Generalized cerebral atrophy with chronic microvascular ischemic disease and intracranial atherosclerosis.   Mr Brain Wo Contrast 03/30/2016   Hemorrhagic conversion is noted within the right basal ganglia infarcts since the earlier CT scan.   Edema and mass effect is noted with the petechial hemorrhage and infarct.  There is partial effacement of the right lateral ventricle. T2 changes are associated with the areas of acute infarct. Impression 1. Acute/subacute infarcts involve the right caudate head, lentiform nucleus, insular cortex, and portions of the right temporal lobe.  2. Moderate generalized atrophy and diffuse white matter disease likely reflects the sequela of chronic microvascular ischemia in addition to the acute infarcts.   Transthoracic echocardiogram 03/30/2016 Study Conclusions - Left ventricle: The cavity size was normal. Wall thickness was  normal. Systolic function was moderately reduced. The estimated   ejection fraction was in the range of 35% to 40%. Diffuse  hypokinesis. Doppler parameters are consistent with high  ventricular filling pressure. - Aortic valve: There was trivial regurgitation. - Mitral valve: Calcified annulus. There was mild regurgitation. - Left atrium: The atrium was mildly dilated. - Right atrium: The atrium was mildly dilated. - Tricuspid valve: There was moderate regurgitation. - Pulmonary arteries: Systolic pressure was moderately increased.  PA peak pressure: 49 mm Hg (S). Impressions: - Technically difficult; definity used; moderate global LV  dysfunction (EF 49); trace AI; mild MR; mild biatrial  enlargement; moderate TR; moderately elevated pulmonary pressure.  Ct Head Wo Contrast  03/31/2016  IMPRESSION: 1. Stable appearance of hemorrhagic infarcts involving the right lentiform nucleus and caudate head without expansion. 2. Expected evolution of nonhemorrhagic infarcts involving the posterior right insular cortex and temporal lobe. 3. Stable white matter disease without other acute infarct.   Component     Latest Ref Rng & Units 03/30/2016  Cholesterol     0 - 200 mg/dL 102  Triglycerides     <150 mg/dL 50  HDL Cholesterol     >40 mg/dL 39 (L)  Total CHOL/HDL Ratio     RATIO 2.6  VLDL     0 - 40 mg/dL 10  LDL (calc)     0 - 99 mg/dL 53  Hemoglobin A1C     4.8 - 5.6 % 8.1 (H)  Mean Plasma Glucose     mg/dL 186    Assessment: As you may recall, she is a 77 y.o. Caucasian female with PMH of CAD/MI s/p CABG, ischemic cardiomyopathy, CHF, atrial fibrillation previously on coumadin therapy, HLD, obesity, HTN, DM, previous TIA, and CKD admitted on 04/01/16 for acute/subacute infarcts involving the right caudate head, lentiform nucleus, insular cortex, and portions of the right temporal lobe in the setting of subtherapeutic INR. Hemorrhagic conversion is noted within the right basal ganglia infarcts. CTA head and neck showed right proximal M2 occlusion  with distal reconstitution. And CT repeat showed stable hematoma. TTE EF 35-40% down from previous 40-45%. A1C 8.1 and LDL 53. Her ASA and coumadin were on hold due to hemorrhagic transformation. She was discharged to CIR with zocor and started eliquis in CIR with stable repeat CT. patient did have repeat TTE on 09/30/2017 which was unchanged from previous study.  At today's follow-up appointment, patient denies new concerns.  Patient is accompanied by her son.  Plan:  I had a long d/w patient about his recent stroke, risk for recurrent stroke/TIAs, personally independently reviewed imaging studies and stroke evaluation results and answered questions.Continue Eliquis (apixaban) daily  for secondary stroke prevention and maintain strict control of hypertension with blood pressure goal below 130/90, diabetes with hemoglobin A1c goal below 6.5% and lipids with LDL cholesterol goal below 70 mg/dL. I also advised the patient to eat a healthy diet with plenty of whole grains, cereals, fruits and vegetables, exercise regularly and maintain ideal body weight  Followup in the future with me in as needed  -Continue to take Eliquis for atrial fibrillation  -Continue to take Lipitor for secondary stroke prevention  -continue to follow with cardiology regarding heart failure and other cardiac issues  -Recommend patient continue to use walker to prevent falls due to increased bleeding risk  -follow up with [place] only as needed  I spent more than 25 minutes of face to face time with the patient. Greater than 50% of time was spent in counseling and coordination of care. We discussed cardiology follow up, repeat TTE, bleeding precautions and BP / glucose monitoring  No orders of the defined types were placed in this encounter.   No orders of the defined types were placed in this encounter.   Patient Instructions  I had a long d/w patient about his recent stroke, risk for recurrent stroke/TIAs, personally  independently reviewed imaging studies and stroke evaluation results and answered questions.Continue Eliquis (apixaban) daily  for secondary stroke prevention and maintain strict control of hypertension with blood pressure goal below 130/90, diabetes with hemoglobin A1c goal below 6.5% and lipids with LDL cholesterol goal below 70 mg/dL. I also advised the patient to eat a healthy diet with plenty of whole grains, cereals, fruits and vegetables, exercise regularly and maintain ideal body weight Followup in the future with me in as needed  -continue to follow with cardiology  -continue with weight loss and healthy diet  -continue to use walker to prevent falls  -follow up with [place] only as needed    Rosalin Hawking, MD PhD [facility] [address] [place], [place] [address] [phone]

## 2017-12-31 NOTE — Patient Instructions (Signed)
I had a long d/w patient about his recent stroke, risk for recurrent stroke/TIAs, personally independently reviewed imaging studies and stroke evaluation results and answered questions.Continue Eliquis (apixaban) daily  for secondary stroke prevention and maintain strict control of hypertension with blood pressure goal below 130/90, diabetes with hemoglobin A1c goal below 6.5% and lipids with LDL cholesterol goal below 70 mg/dL. I also advised the patient to eat a healthy diet with plenty of whole grains, cereals, fruits and vegetables, exercise regularly and maintain ideal body weight Followup in the future with me in as needed  -continue to follow with cardiology  -continue with weight loss and healthy diet  -continue to use walker to prevent falls  -follow up with [place] only as needed

## 2018-01-02 ENCOUNTER — Telehealth (HOSPITAL_COMMUNITY): Payer: Self-pay | Admitting: *Deleted

## 2018-01-02 NOTE — Telephone Encounter (Signed)
Should take torsemide 80 mg BID x 2 days, with one metolazone tomorrow. With extra 40 meq of K.      Thanks!   Legrand Como [address] [place], PA-C 01/02/2018 1:38 PM

## 2018-01-02 NOTE — Telephone Encounter (Signed)
Advanced Heart Failure Triage Encounter  Patient Name: [name]  Date of Call: 01/02/18  Problem: wt gain  [facility] called to report patient has had a 12 lb wt gain in 4 days, 5.4 lb in 2 days.  I called and spoke with patient directly and she stated her Granddaughter had moved in with her and she has been cooking food that is high in sodium.  I reinforced dietary restriction and she understands and will discuss it with her granddaughter as well. She isn't experiencing any shortness of breath.  Plan:  Will forward to Barrington Ellison, PA to see if he wants her to take a metolazone for weight gain.   Darron Doom, RN

## 2018-01-02 NOTE — Telephone Encounter (Signed)
Called patient and she is agreeable with plan.  She will call us back if her weight doesn't come down.  No further questions.

## 2018-01-04 DIAGNOSIS — G4733 Obstructive sleep apnea (adult) (pediatric): Secondary | ICD-10-CM | POA: Diagnosis not present

## 2018-01-06 DIAGNOSIS — H353122 Nonexudative age-related macular degeneration, left eye, intermediate dry stage: Secondary | ICD-10-CM | POA: Diagnosis not present

## 2018-01-06 DIAGNOSIS — H353112 Nonexudative age-related macular degeneration, right eye, intermediate dry stage: Secondary | ICD-10-CM | POA: Diagnosis not present

## 2018-01-06 DIAGNOSIS — H353221 Exudative age-related macular degeneration, left eye, with active choroidal neovascularization: Secondary | ICD-10-CM | POA: Diagnosis not present

## 2018-01-06 DIAGNOSIS — H43822 Vitreomacular adhesion, left eye: Secondary | ICD-10-CM | POA: Diagnosis not present

## 2018-01-07 NOTE — Telephone Encounter (Signed)
Patient called back today stating that she is still gaining weight.  Her weight did come down after increasing her meds last week but she had another 3 lb wt gain overnight.  Her is weight is now 274 lbs. Doesn't complain of any shortness of breath or chest pain. Does complain of some leg swelling.    I spoke with Darrick Grinder NP and patient weight is actually down 10 lbs since we last saw her in the clinic.  No medications for now just have patient continue her current medications, keep legs elevated, and watch her sodium intake.  Patient is agreeable with plan and if she starts to feel short of breath or chest pain she will call back.

## 2018-01-13 ENCOUNTER — Telehealth (HOSPITAL_COMMUNITY): Payer: Self-pay | Admitting: *Deleted

## 2018-01-13 NOTE — Telephone Encounter (Signed)
Patient called to report she has had a 6.78 lb weight gain in 3 lbs.  Her weight is up to 280 lbs.  She has continued to gradually gain weight, 19 lbs in 15 days.  She is very compliant with her medications.  I spoke with Barrington Ellison, PA and patient just needs to come in and be seen in clinic tomorrow.  Today she may increase her torsemide to 80 mg BID.  Patient is agreeable with plan and appointment scheduled.

## 2018-01-14 ENCOUNTER — Telehealth: Payer: Self-pay | Admitting: Cardiology

## 2018-01-14 ENCOUNTER — Encounter (HOSPITAL_COMMUNITY): Payer: Self-pay

## 2018-01-14 ENCOUNTER — Ambulatory Visit (HOSPITAL_COMMUNITY)
Admission: RE | Admit: 2018-01-14 | Discharge: 2018-01-14 | Disposition: A | Discharge status: Home / Self Care | Payer: Medicare Other | Source: Ambulatory Visit | Attending: Cardiology | Admitting: Cardiology

## 2018-01-14 VITALS — BP 118/68 | HR 84 | Wt 284.0 lb

## 2018-01-14 DIAGNOSIS — I4819 Other persistent atrial fibrillation: Secondary | ICD-10-CM

## 2018-01-14 DIAGNOSIS — Z6841 Body Mass Index (BMI) 40.0 and over, adult: Secondary | ICD-10-CM | POA: Insufficient documentation

## 2018-01-14 DIAGNOSIS — E1142 Type 2 diabetes mellitus with diabetic polyneuropathy: Secondary | ICD-10-CM | POA: Diagnosis not present

## 2018-01-14 DIAGNOSIS — Z882 Allergy status to sulfonamides status: Secondary | ICD-10-CM | POA: Diagnosis not present

## 2018-01-14 DIAGNOSIS — S81801A Unspecified open wound, right lower leg, initial encounter: Secondary | ICD-10-CM | POA: Diagnosis not present

## 2018-01-14 DIAGNOSIS — N183 Chronic kidney disease, stage 3 unspecified: Secondary | ICD-10-CM

## 2018-01-14 DIAGNOSIS — I25810 Atherosclerosis of coronary artery bypass graft(s) without angina pectoris: Secondary | ICD-10-CM | POA: Diagnosis not present

## 2018-01-14 DIAGNOSIS — E1122 Type 2 diabetes mellitus with diabetic chronic kidney disease: Secondary | ICD-10-CM | POA: Diagnosis not present

## 2018-01-14 DIAGNOSIS — Z951 Presence of aortocoronary bypass graft: Secondary | ICD-10-CM | POA: Diagnosis not present

## 2018-01-14 DIAGNOSIS — I10 Essential (primary) hypertension: Secondary | ICD-10-CM | POA: Diagnosis not present

## 2018-01-14 DIAGNOSIS — I255 Ischemic cardiomyopathy: Secondary | ICD-10-CM | POA: Diagnosis not present

## 2018-01-14 DIAGNOSIS — I2510 Atherosclerotic heart disease of native coronary artery without angina pectoris: Secondary | ICD-10-CM | POA: Diagnosis not present

## 2018-01-14 DIAGNOSIS — R238 Other skin changes: Secondary | ICD-10-CM | POA: Diagnosis not present

## 2018-01-14 DIAGNOSIS — Z8249 Family history of ischemic heart disease and other diseases of the circulatory system: Secondary | ICD-10-CM | POA: Insufficient documentation

## 2018-01-14 DIAGNOSIS — Z801 Family history of malignant neoplasm of trachea, bronchus and lung: Secondary | ICD-10-CM | POA: Diagnosis not present

## 2018-01-14 DIAGNOSIS — X58XXXA Exposure to other specified factors, initial encounter: Secondary | ICD-10-CM | POA: Diagnosis not present

## 2018-01-14 DIAGNOSIS — Z794 Long term (current) use of insulin: Secondary | ICD-10-CM | POA: Insufficient documentation

## 2018-01-14 DIAGNOSIS — G4733 Obstructive sleep apnea (adult) (pediatric): Secondary | ICD-10-CM | POA: Diagnosis not present

## 2018-01-14 DIAGNOSIS — I1310 Hypertensive heart and chronic kidney disease without heart failure, with stage 1 through stage 4 chronic kidney disease, or unspecified chronic kidney disease: Secondary | ICD-10-CM | POA: Insufficient documentation

## 2018-01-14 DIAGNOSIS — I272 Pulmonary hypertension, unspecified: Secondary | ICD-10-CM | POA: Insufficient documentation

## 2018-01-14 DIAGNOSIS — I481 Persistent atrial fibrillation: Secondary | ICD-10-CM | POA: Insufficient documentation

## 2018-01-14 DIAGNOSIS — Z7902 Long term (current) use of antithrombotics/antiplatelets: Secondary | ICD-10-CM | POA: Insufficient documentation

## 2018-01-14 DIAGNOSIS — E785 Hyperlipidemia, unspecified: Secondary | ICD-10-CM | POA: Insufficient documentation

## 2018-01-14 DIAGNOSIS — I878 Other specified disorders of veins: Secondary | ICD-10-CM | POA: Diagnosis not present

## 2018-01-14 DIAGNOSIS — Z79899 Other long term (current) drug therapy: Secondary | ICD-10-CM | POA: Diagnosis not present

## 2018-01-14 DIAGNOSIS — I5022 Chronic systolic (congestive) heart failure: Secondary | ICD-10-CM | POA: Insufficient documentation

## 2018-01-14 DIAGNOSIS — Z836 Family history of other diseases of the respiratory system: Secondary | ICD-10-CM | POA: Insufficient documentation

## 2018-01-14 DIAGNOSIS — Z888 Allergy status to other drugs, medicaments and biological substances status: Secondary | ICD-10-CM | POA: Diagnosis not present

## 2018-01-14 DIAGNOSIS — Z8673 Personal history of transient ischemic attack (TIA), and cerebral infarction without residual deficits: Secondary | ICD-10-CM | POA: Insufficient documentation

## 2018-01-14 LAB — BASIC METABOLIC PANEL
ANION GAP: 9 (ref 5–15)
BUN: 32 mg/dL — ABNORMAL HIGH (ref 6–20)
CALCIUM: 9.5 mg/dL (ref 8.9–10.3)
CO2: 28 mmol/L (ref 22–32)
Chloride: 102 mmol/L (ref 101–111)
Creatinine, Ser: 1.57 mg/dL — ABNORMAL HIGH (ref 0.44–1.00)
GFR, EST AFRICAN AMERICAN: 35 mL/min — AB (ref >60)
GFR, EST NON AFRICAN AMERICAN: 30 mL/min — AB (ref >60)
Glucose, Bld: 105 mg/dL — ABNORMAL HIGH (ref 65–99)
Potassium: 4.1 mmol/L (ref 3.5–5.1)
Sodium: 139 mmol/L (ref 135–145)

## 2018-01-14 MED ORDER — METOLAZONE 2.5 MG PO TABS: 2.5000 mg | ORAL_TABLET | Freq: Every day | ORAL | 3 refills | Status: DC | PRN

## 2018-01-14 NOTE — Telephone Encounter (Signed)
PAP assistant reached out to the patient to follow up on her call and the patient states she is having a dry mouth//nose and bloody nose in the mornings.Please advise.

## 2018-01-14 NOTE — Telephone Encounter (Signed)
What problem are you experiencing? Patient states that pressure was increased in the machine and now her nose is dry and she would like to know if pressure could be decreased? 1) Who is your medical equipment company?   Please route to the sleep study assistant.

## 2018-01-14 NOTE — Patient Instructions (Signed)
TAKE Metoalzone 2.5 mg ,one tab daily for the next 2 days, then resume to only taking as needed for increased weight (3lbs overnight or 5 lbs in a week). BE SURE TO NOT TAKE MORE THAN ONCE A WEEK  With every dose of Metoalzone, please take a EXTRA 40 MEQ of Potassium  INCREASE Torsemide to 80 mg, twice a day for the next 2 days, then resume normal dose of 60 mg twice a day  Labs today We will only contact you if something comes back abnormal or we need to make some changes. Otherwise no news is good news!  Your physician recommends that you schedule a follow-up appointment in: 2 weeks with Rebecca Eaton   Do the following things EVERYDAY: 1) Weigh yourself in the morning before breakfast. Write it down and keep it in a log. 2) Take your medicines as prescribed 3) Eat low salt foods-Limit salt (sodium) to 2000 mg per day.  4) Stay as active as you can everyday 5) Limit all fluids for the day to less than 2 liters

## 2018-01-14 NOTE — Progress Notes (Signed)
Advanced Heart Failure Clinic Note   Referring Physician: Dr. Angelena Form Primary Care: Dr. Juanito Doom Primary Cardiologist: Dr. Angelena Form HF: New  HPI:  [name] is a 77 y.o. female [name] is a 77 y.o. female with history of CAD status post CABG 2009, ischemic cardiomyopathy with chronic systolic CHF, persistent atrial fibrillation with stroke in 03/2016 when INR was subtherapeutic, stage III CKD, morbid obesity, pulmonary hypertension, diabetes, GI bleed 03/2017, and severe sleep apnea.   Seen in [facility] clinics 10/30 and 11/13, both times with volume overload and marked peripheral edema with skin breakdown. Torsemide increased at each visit.   She presents today for add on due to weight gain. Patient reports nearly 20 lb weight gain in 2 weeks. She is the same weight as her visit in this clinic 11/19/17, and remains down 30 lbs from when she was initially seen.   She states her granddaughter was staying with her and she made [place] "most days".  She has otherwise been watching her fluid intake.  Denies SOB. She sleeps sitting in a recliner with her CPAP on. Wears every night. She denies CP, palpitations, lightheadedness or dizziness. Taking all medications as ordered, she is actually out of metolazone.   Labs 10/14/17 K 3.9, Creatinine 1.65  Echo 09/30/2017 LVEF 35-40%, severe RV dilation, severe RAE, Severe TR, PA peak pressure.   Review of systems complete and found to be negative unless listed in HPI.    Past Medical History:  Diagnosis Date  . ACC/AHA stage C congestive heart failure due to ischemic cardiomyopathy ([place]) 01/31/2009   Qualifier: Diagnosis of  By: Olevia Perches, MD, Glenetta Hew   . ANXIETY 01/31/2009   Qualifier: Diagnosis of  By: Lovette Cliche, CNA, Christy    . Arthritis    "qwhere" (03/30/2016)  . Benign essential HTN   . CAD (coronary artery disease) 05/2008   a. s/p CABG in 2009.  Marland Kitchen Cerebrovascular accident (CVA) due to embolism of right middle cerebral artery ([place])     . Chronic anticoagulation   . Chronic kidney disease (CKD), stage III (moderate) (HCC)   . Chronic lower back pain   . Chronic pain syndrome   . Chronic systolic CHF (congestive heart failure) ([place])   . DM type 2 with diabetic peripheral neuropathy ([place])   . Facial weakness, post-stroke   . Gastrointestinal hemorrhage 03/26/2017  . Hemiparesis ([place])   . History of CVA with residual deficit 03/26/2017  . Hyperlipidemia   . Hypertension   . Ischemic cardiomyopathy   . Longstanding persistent atrial fibrillation ([place])    a. Postoperative in 2009;  S/P transesophageal echocardiography-guided cardioversion, previously on Amiodarone and Coumadin. b. recurrent in April 2013 -> pt elected rate control strategy, initially Coumadin -> had stroke with subtherapeutic INR in 03/2016 and transitioned to Eliquis.  . Morbid obesity ([place]) 03/26/2017  . OSA on CPAP    severe OSA with AHI 34/hr  . Persistent atrial fibrillation ([place])   . Thrombocytopenia ([place])     Current Outpatient Medications  Medication Sig Dispense Refill  . atorvastatin (LIPITOR) 40 MG tablet Take 1 tablet (40 mg total) by mouth daily at 6 PM. 90 tablet 1  . carvedilol (COREG) 6.25 MG tablet Take 1 tablet (6.25 mg total) by mouth 2 (two) times daily with a meal. 60 tablet 6  . cholecalciferol (VITAMIN D) 1000 UNITS tablet Take 1,000 Units by mouth 2 (two) times daily.    Marland Kitchen docusate sodium (COLACE) 100 MG capsule Take 200  mg by mouth at bedtime.     Marland Kitchen ELIQUIS 5 MG TABS tablet TAKE 1 TABLET BY MOUTH TWICE DAILY 60 tablet 0  . gabapentin (NEURONTIN) 400 MG capsule Take 400 mg by mouth 2 (two) times daily.    . insulin glargine (LANTUS) 100 UNIT/ML injection Inject 0.23 mLs (23 Units total) into the skin at bedtime. 10 mL 2  . metolazone (ZAROXOLYN) 2.5 MG tablet Take 1 tablet (2.5 mg total) by mouth daily as needed. Take extra 20 meq potassium tab. Must call CHF clinic before taking ([phone]) 5 tablet 0  . nitroGLYCERIN (NITROSTAT)  0.4 MG SL tablet Place 0.4 mg under the tongue every 5 (five) minutes as needed for chest pain (up to 3 doses).     . potassium chloride SA (K-DUR,KLOR-CON) 20 MEQ tablet Take 2 tablets (40 mEq total) by mouth every morning AND 1 tablet (20 mEq total) every evening. 90 tablet 6  . rOPINIRole (REQUIP) 0.5 MG tablet TAKE 2 TABLETS BY MOUTH AT  BEDTIME 120 tablet 1  . torsemide (DEMADEX) 20 MG tablet Take 3 tablets (60 mg total) by mouth 2 (two) times daily. 180 tablet 3  . traMADol (ULTRAM) 50 MG tablet TAKE TWO TABLETS BY MOUTH EVERY MORNING AND ONE TABLET AT NIGHT 90 tablet 0  . vitamin B-12 (CYANOCOBALAMIN) 1000 MCG tablet Take 1 tablet (1,000 mcg total) by mouth daily. 90 tablet 0   No current facility-administered medications for this encounter.    Allergies  Allergen Reactions  . Benazepril Other (See Comments)    Unknown reaction at age 45-65 - possibly dizziness  . Angiotensin Receptor Blockers Other (See Comments)    Hypotension reaction  . Fe-Succ-C-Thre-B12-Des Stomach Other (See Comments)    Unknown reaction  . Spironolactone Other (See Comments)    Dizziness   . Sulfamethoxazole-Trimethoprim Other (See Comments)    Unknown reaction   Social History   Socioeconomic History  . Marital status: Widowed    Spouse name: Not on file  . Number of children: Not on file  . Years of education: Not on file  . Highest education level: Not on file  Social Needs  . Financial resource strain: Not on file  . Food insecurity - worry: Not on file  . Food insecurity - inability: Not on file  . Transportation needs - medical: Not on file  . Transportation needs - non-medical: Not on file  Occupational History  . Not on file  Tobacco Use  . Smoking status: Never Smoker  . Smokeless tobacco: Never Used  Substance and Sexual Activity  . Alcohol use: No  . Drug use: No  . Sexual activity: Not Currently  Other Topics Concern  . Not on file  Social History Narrative   Widowed    Lives alone in [place]   2 children   Not routinely exercising   Family History  Problem Relation Age of Onset  . Clotting disorder Mother        Cerebral hemorrhage  . Emphysema Father        COD  . Lung cancer Brother   . Heart disease Neg Hx    Vitals:   01/14/18 0835  BP: 118/68  Pulse: 84  SpO2: 95%  Weight: 284 lb (128.8 kg)     Wt Readings from Last 3 Encounters:  01/14/18 284 lb (128.8 kg)  12/31/17 273 lb 3.2 oz (123.9 kg)  12/16/17 267 lb (121.1 kg)    PHYSICAL EXAM: General: Elderly appearing. Ambulated  into clinic with rollator. Slowly, but without SOB.  HEENT: Normal Neck: Supple. JVP 10-11 cm at least, Carotids 2+ bilat; no bruits. No thyromegaly or nodule noted. Cor: PMI nondisplaced. Irregularly irregular, No M/G/R noted Lungs: CTAB, normal effort. Abdomen: Soft, non-tender, non-distended, no HSM. No bruits or masses. +BS  Extremities: No cyanosis, clubbing, or rash. R and LLE no edema.  Neuro: Alert & orientedx3, cranial nerves grossly intact. moves all 4 extremities w/o difficulty. Affect pleasant   ASSESSMENT & PLAN:  1. Chronic systolic CHF due to ICM ->  R>L CHF - Echo 09/30/2017 LVEF 35-40%, severe RV dilation, severe RAE, Severe TR, PA peak pressure.  - NYHA II - Volume status elevated on exam.   - Take torsemide 80 mg BID + 2.5 mg metoalzone + 40 meq of extra K x 2 days, then go back to torsemide 60 mg BID. BMET today.  - Continue coreg 6.25 mg BID - Reinforced fluid restriction to < 2 L daily, sodium restriction to less than 2000 mg daily, and the importance of daily weights.    2. CAD sp CABG - No s/s of ischemia.    - Continue atorvastatin 40 mg daily.  - No ASA with stable CAD on Eliquis.   3. HTN - Stable on current meds. Will not adjust meds with diuresis.    4. Persistent Afib - Rate controlled.  - Continue Eliquis. Denies bleeding.   5. CKD stage 3 - BMET today.   6. Morbid obesity - Body mass index is 41.94 kg/m.  -  Stressed importance of weight loss and portion control, as well as sodium restriction.   7. Chronic venous stasis with RLE wound/Bullae - Improved with unna boots and diuresis.  No further wounds.   Volume overloaded but without SOB, mostly limited by her peripheral edema.  Meds and labs as above. RTC 2 weeks for close follow up.   Shirley Friar, PA-C 01/14/18   Greater than 50% of the 25 minute visit was spent in counseling/coordination of care regarding disease state education, salt/fluid restriction, sliding scale diuretics, and medication compliance.

## 2018-01-15 NOTE — Telephone Encounter (Signed)
The pressure was set on auto and should not have been increased - please make sure it is on auto and that she is using humidty

## 2018-01-17 ENCOUNTER — Other Ambulatory Visit: Payer: Self-pay | Admitting: Family Medicine

## 2018-01-17 NOTE — Telephone Encounter (Signed)
Reached out to the patient to follow up with her and she is using her humidity and things are better now.

## 2018-01-21 ENCOUNTER — Telehealth: Payer: Self-pay

## 2018-01-21 NOTE — Telephone Encounter (Signed)
[name]- calling from Dr. Mack Guise office ([facility]). Pt is needing 3-4 extractions, and they are needing something faxed over in writing about what to do regarding patients eliquis. Can be faxed to [phone] Call back number [phone] Wallace Cullens, RN

## 2018-01-23 NOTE — Telephone Encounter (Signed)
Caryl Pina from Dr. Huston Foley office calling again requesting written documentation be sent regarding patients eliquis. Wallace Cullens, RN

## 2018-01-25 ENCOUNTER — Other Ambulatory Visit: Payer: Self-pay | Admitting: Family Medicine

## 2018-01-27 ENCOUNTER — Encounter: Payer: Self-pay | Admitting: Family Medicine

## 2018-01-27 NOTE — Telephone Encounter (Signed)
Caryl Pina called again about the documentation requested.  They cannot schedule extractions for the pt until they receive the documentation

## 2018-01-27 NOTE — Telephone Encounter (Signed)
They can stop Eliquis 48 hours prior to the surgery and resume 24 hours after. Will send over fax later today.   Smitty Cords, MD [place], PGY-3

## 2018-01-29 ENCOUNTER — Encounter (HOSPITAL_COMMUNITY): Payer: Self-pay

## 2018-01-29 ENCOUNTER — Ambulatory Visit (HOSPITAL_COMMUNITY)
Admission: RE | Admit: 2018-01-29 | Discharge: 2018-01-29 | Disposition: A | Discharge status: Home / Self Care | Payer: Medicare Other | Source: Ambulatory Visit | Attending: Cardiology | Admitting: Cardiology

## 2018-01-29 ENCOUNTER — Other Ambulatory Visit: Payer: Self-pay | Admitting: Family Medicine

## 2018-01-29 VITALS — BP 142/70 | HR 70 | Wt 271.0 lb

## 2018-01-29 DIAGNOSIS — Z882 Allergy status to sulfonamides status: Secondary | ICD-10-CM | POA: Insufficient documentation

## 2018-01-29 DIAGNOSIS — N183 Chronic kidney disease, stage 3 unspecified: Secondary | ICD-10-CM

## 2018-01-29 DIAGNOSIS — S81801A Unspecified open wound, right lower leg, initial encounter: Secondary | ICD-10-CM | POA: Insufficient documentation

## 2018-01-29 DIAGNOSIS — I5022 Chronic systolic (congestive) heart failure: Secondary | ICD-10-CM | POA: Diagnosis not present

## 2018-01-29 DIAGNOSIS — I25810 Atherosclerosis of coronary artery bypass graft(s) without angina pectoris: Secondary | ICD-10-CM

## 2018-01-29 DIAGNOSIS — X58XXXA Exposure to other specified factors, initial encounter: Secondary | ICD-10-CM | POA: Insufficient documentation

## 2018-01-29 DIAGNOSIS — Z951 Presence of aortocoronary bypass graft: Secondary | ICD-10-CM | POA: Insufficient documentation

## 2018-01-29 DIAGNOSIS — I2510 Atherosclerotic heart disease of native coronary artery without angina pectoris: Secondary | ICD-10-CM | POA: Diagnosis not present

## 2018-01-29 DIAGNOSIS — Z6841 Body Mass Index (BMI) 40.0 and over, adult: Secondary | ICD-10-CM | POA: Diagnosis not present

## 2018-01-29 DIAGNOSIS — I130 Hypertensive heart and chronic kidney disease with heart failure and stage 1 through stage 4 chronic kidney disease, or unspecified chronic kidney disease: Secondary | ICD-10-CM | POA: Diagnosis not present

## 2018-01-29 DIAGNOSIS — Z79899 Other long term (current) drug therapy: Secondary | ICD-10-CM | POA: Diagnosis not present

## 2018-01-29 DIAGNOSIS — G4733 Obstructive sleep apnea (adult) (pediatric): Secondary | ICD-10-CM | POA: Diagnosis not present

## 2018-01-29 DIAGNOSIS — F419 Anxiety disorder, unspecified: Secondary | ICD-10-CM | POA: Diagnosis not present

## 2018-01-29 DIAGNOSIS — E1122 Type 2 diabetes mellitus with diabetic chronic kidney disease: Secondary | ICD-10-CM | POA: Diagnosis not present

## 2018-01-29 DIAGNOSIS — I4819 Other persistent atrial fibrillation: Secondary | ICD-10-CM

## 2018-01-29 DIAGNOSIS — G894 Chronic pain syndrome: Secondary | ICD-10-CM | POA: Insufficient documentation

## 2018-01-29 DIAGNOSIS — E1142 Type 2 diabetes mellitus with diabetic polyneuropathy: Secondary | ICD-10-CM | POA: Diagnosis not present

## 2018-01-29 DIAGNOSIS — I878 Other specified disorders of veins: Secondary | ICD-10-CM

## 2018-01-29 DIAGNOSIS — I10 Essential (primary) hypertension: Secondary | ICD-10-CM | POA: Diagnosis not present

## 2018-01-29 DIAGNOSIS — Z79891 Long term (current) use of opiate analgesic: Secondary | ICD-10-CM | POA: Insufficient documentation

## 2018-01-29 DIAGNOSIS — I481 Persistent atrial fibrillation: Secondary | ICD-10-CM | POA: Diagnosis not present

## 2018-01-29 DIAGNOSIS — Z794 Long term (current) use of insulin: Secondary | ICD-10-CM | POA: Diagnosis not present

## 2018-01-29 DIAGNOSIS — Z888 Allergy status to other drugs, medicaments and biological substances status: Secondary | ICD-10-CM | POA: Insufficient documentation

## 2018-01-29 LAB — BASIC METABOLIC PANEL
ANION GAP: 7 (ref 5–15)
BUN: 37 mg/dL — ABNORMAL HIGH (ref 6–20)
CO2: 29 mmol/L (ref 22–32)
Calcium: 9.7 mg/dL (ref 8.9–10.3)
Chloride: 103 mmol/L (ref 101–111)
Creatinine, Ser: 1.35 mg/dL — ABNORMAL HIGH (ref 0.44–1.00)
GFR calc non Af Amer: 37 mL/min — ABNORMAL LOW (ref >60)
GFR, EST AFRICAN AMERICAN: 42 mL/min — AB (ref >60)
Glucose, Bld: 132 mg/dL — ABNORMAL HIGH (ref 65–99)
POTASSIUM: 4.2 mmol/L (ref 3.5–5.1)
SODIUM: 139 mmol/L (ref 135–145)

## 2018-01-29 MED ORDER — SPIRONOLACTONE 25 MG PO TABS: 12.5000 mg | ORAL_TABLET | Freq: Every day | ORAL | 3 refills | Status: DC

## 2018-01-29 MED ORDER — POTASSIUM CHLORIDE CRYS ER 20 MEQ PO TBCR: 40.0000 meq | EXTENDED_RELEASE_TABLET | Freq: Every day | ORAL | 11 refills | Status: DC

## 2018-01-29 NOTE — Progress Notes (Signed)
Advanced Heart Failure Clinic Note   Referring Physician: Dr. Angelena Form Primary Care: Dr. Juanito Doom Primary Cardiologist: Dr. Angelena Form HF: New  HPI:  [name] is a 77 y.o. female [name] is a 77 y.o. female with history of CAD status post CABG 2009, ischemic cardiomyopathy with chronic systolic CHF, persistent atrial fibrillation with stroke in 03/2016 when INR was subtherapeutic, stage III CKD, morbid obesity, pulmonary hypertension, diabetes, GI bleed 03/2017, and severe sleep apnea.   Se[facility] in [facility] 10/30 and 11/13, both times with volume overload and marked peripheral edema with skin breakdown. Torsemide increased at each visit.   She presents today for 2 week follow up. At last visit given extra torsemide and metolazone. She is down 13 lbs. She feels much better. Has been watching her salt and fluid. Has upcoming tooth extraction and asking about holding eliquis.  She denies CP, palpitations, lightheadedness or dizziness. Taking all medications as ordered. Wearing CPAP nightly. She has not needed any more metolazone.   Labs 10/14/17 K 3.9, Creatinine 1.65  Echo 09/30/2017 LVEF 35-40%, severe RV dilation, severe RAE, Severe TR, PA peak pressure.   Review of systems complete and found to be negative unless listed in HPI.    Past Medical History:  Diagnosis Date  . ACC/AHA stage C congestive heart failure due to ischemic cardiomyopathy ([place]) 01/31/2009   Qualifier: Diagnosis of  By: Olevia Perches, MD, Glenetta Hew   . ANXIETY 01/31/2009   Qualifier: Diagnosis of  By: Lovette Cliche, CNA, Christy    . Arthritis    "qwhere" (03/30/2016)  . Benign essential HTN   . CAD (coronary artery disease) 05/2008   a. s/p CABG in 2009.  Marland Kitchen Cerebrovascular accident (CVA) due to embolism of right middle cerebral artery ([place])   . Chronic anticoagulation   . Chronic kidney disease (CKD), stage III (moderate) (HCC)   . Chronic lower back pain   . Chronic pain syndrome   . Chronic systolic CHF  (congestive heart failure) ([place])   . DM type 2 with diabetic peripheral neuropathy ([place])   . Facial weakness, post-stroke   . Gastrointestinal hemorrhage 03/26/2017  . Hemiparesis ([place])   . History of CVA with residual deficit 03/26/2017  . Hyperlipidemia   . Hypertension   . Ischemic cardiomyopathy   . Longstanding persistent atrial fibrillation ([place])    a. Postoperative in 2009;  S/P transesophageal echocardiography-guided cardioversion, previously on Amiodarone and Coumadin. b. recurrent in April 2013 -> pt elected rate control strategy, initially Coumadin -> had stroke with subtherapeutic INR in 03/2016 and transitioned to Eliquis.  . Morbid obesity ([place]) 03/26/2017  . OSA on CPAP    severe OSA with AHI 34/hr  . Persistent atrial fibrillation ([place])   . Thrombocytopenia ([place])     Current Outpatient Medications  Medication Sig Dispense Refill  . atorvastatin (LIPITOR) 40 MG tablet Take 1 tablet (40 mg total) by mouth daily at 6 PM. 90 tablet 1  . carvedilol (COREG) 6.25 MG tablet Take 1 tablet (6.25 mg total) by mouth 2 (two) times daily with a meal. 60 tablet 6  . cholecalciferol (VITAMIN D) 1000 UNITS tablet Take 1,000 Units by mouth 2 (two) times daily.    Marland Kitchen docusate sodium (COLACE) 100 MG capsule Take 200 mg by mouth at bedtime.     Marland Kitchen ELIQUIS 5 MG TABS tablet TAKE ONE (1) TABLET BY MOUTH TWO (2) TIMES DAILY 60 tablet 0  . gabapentin (NEURONTIN) 400 MG capsule Take 400 mg by  mouth 2 (two) times daily.    . insulin glargine (LANTUS) 100 UNIT/ML injection Inject 0.23 mLs (23 Units total) into the skin at bedtime. 10 mL 2  . metolazone (ZAROXOLYN) 2.5 MG tablet Take 1 tablet (2.5 mg total) by mouth daily as needed. Take extra 20 meq potassium tab. Must call CHF clinic before taking ([phone]) 30 tablet 3  . nitroGLYCERIN (NITROSTAT) 0.4 MG SL tablet Place 0.4 mg under the tongue every 5 (five) minutes as needed for chest pain (up to 3 doses).     . potassium chloride SA  (K-DUR,KLOR-CON) 20 MEQ tablet Take 2 tablets (40 mEq total) by mouth every morning AND 1 tablet (20 mEq total) every evening. 90 tablet 6  . rOPINIRole (REQUIP) 0.5 MG tablet TAKE 2 TABLETS BY MOUTH AT  BEDTIME 180 tablet 0  . torsemide (DEMADEX) 20 MG tablet Take 3 tablets (60 mg total) by mouth 2 (two) times daily. 180 tablet 3  . traMADol (ULTRAM) 50 MG tablet TAKE TWO TABLETS BY MOUTH EVERY MORNING AND ONE TABLET AT NIGHT 90 tablet 0  . vitamin B-12 (CYANOCOBALAMIN) 1000 MCG tablet Take 1 tablet (1,000 mcg total) by mouth daily. 90 tablet 0   No current facility-administered medications for this encounter.    Allergies  Allergen Reactions  . Benazepril Other (See Comments)    Unknown reaction at age 56-65 - possibly dizziness  . Angiotensin Receptor Blockers Other (See Comments)    Hypotension reaction  . Fe-Succ-C-Thre-B12-Des Stomach Other (See Comments)    Unknown reaction  . Spironolactone Other (See Comments)    Dizziness   . Sulfamethoxazole-Trimethoprim Other (See Comments)    Unknown reaction   Social History   Socioeconomic History  . Marital status: Widowed    Spouse name: Not on file  . Number of children: Not on file  . Years of education: Not on file  . Highest education level: Not on file  Social Needs  . Financial resource strain: Not on file  . Food insecurity - worry: Not on file  . Food insecurity - inability: Not on file  . Transportation needs - medical: Not on file  . Transportation needs - non-medical: Not on file  Occupational History  . Not on file  Tobacco Use  . Smoking status: Never Smoker  . Smokeless tobacco: Never Used  Substance and Sexual Activity  . Alcohol use: No  . Drug use: No  . Sexual activity: Not Currently  Other Topics Concern  . Not on file  Social History Narrative   Widowed   Lives alone in [place]   2 children   Not routinely exercising   Family History  Problem Relation Age of Onset  . Clotting disorder  Mother        Cerebral hemorrhage  . Emphysema Father        COD  . Lung cancer Brother   . Heart disease Neg Hx    Vitals:   01/29/18 0817  BP: (!) 142/70  Pulse: 70  SpO2: 100%  Weight: 271 lb (122.9 kg)     Wt Readings from Last 3 Encounters:  01/29/18 271 lb (122.9 kg)  01/14/18 284 lb (128.8 kg)  12/31/17 273 lb 3.2 oz (123.9 kg)    PHYSICAL EXAM: General: Well appearing. No resp difficulty. HEENT: Normal Neck: Supple. JVP 7-8 cm. Carotids 2+ bilat; no bruits. No thyromegaly or nodule noted. Cor: PMI nondisplaced. Irregularly irregularly, No M/G/R noted Lungs: CTAB, normal effort. Abdomen: Soft, non-tender, non-distended,  no HSM. No bruits or masses. +BS  Extremities: No cyanosis, clubbing, or rash. 1+ edema.  Neuro: Alert & orientedx3, cranial nerves grossly intact. moves all 4 extremities w/o difficulty. Affect pleasant   ASSESSMENT & PLAN:  1. Chronic systolic CHF due to ICM ->  R>L CHF - Echo 09/30/2017 LVEF 35-40%, severe RV dilation, severe RAE, Severe TR, PA peak pressure.  - NYHA II - Volume status stable on exam.  - Continue torsemide 60 mg BID. BMET today.  - Continue coreg 6.25 mg BID - Add spiro 12.5 mg daily. Repeat BMET 10 days.  - Reinforced fluid restriction to < 2 L daily, sodium restriction to less than 2000 mg daily, and the importance of daily weights.    2. CAD sp CABG - No s/s of ischemia.    - Continue atorvastatin 40 mg daily.  - No ASA with stable CAD on Eliquis.   3. HTN - Meds as above.   4. Persistent Afib - Rate controlled.  - Denies bleeding on Eliquis.   5. CKD stage 3 - BMET today.   6. Morbid obesity - Body mass index is 40.02 kg/m.  - Stressed importance of weight loss.   7. Chronic venous stasis with RLE wound/Bullae - Much improved with diuresis. No further wounds.   8. Tooth Extraction - Typically, holding Eliquis is not required for tooth extraction. If recommended by surgeon, would hold a maximum of 24  hours prior and resume as soon as possible after procedure.    Doing much better with diuresis. Meds as above. 4 week follow up. Labs today and 10 days.   Shirley Friar, PA-C 01/29/18   Greater than 50% of the 30 minute visit was spent in counseling/coordination of care regarding disease state education, salt/fluid restriction, sliding scale diuretics, and medication compliance.

## 2018-01-29 NOTE — Patient Instructions (Addendum)
START Spironolactone 12.5 mg (1/2 tablet) once daily.  DECREASE Potassium to 40 meq (2 tabs) once daily.  Routine lab work today. Will notify you of abnormal results, otherwise no news is good news!  Follow up 4 weeks with Oda Kilts PA-C.  Take all medication as prescribed the day of your appointment. Bring all medications with you to your appointment.  Do the following things EVERYDAY: 1) Weigh yourself in the morning before breakfast. Write it down and keep it in a log. 2) Take your medicines as prescribed 3) Eat low salt foods-Limit salt (sodium) to 2000 mg per day.  4) Stay as active as you can everyday 5) Limit all fluids for the day to less than 2 liters

## 2018-02-01 DIAGNOSIS — G4733 Obstructive sleep apnea (adult) (pediatric): Secondary | ICD-10-CM | POA: Diagnosis not present

## 2018-02-12 DIAGNOSIS — N183 Chronic kidney disease, stage 3 (moderate): Secondary | ICD-10-CM | POA: Diagnosis not present

## 2018-02-17 ENCOUNTER — Encounter (HOSPITAL_COMMUNITY): Payer: Medicare Other

## 2018-02-19 DIAGNOSIS — E1122 Type 2 diabetes mellitus with diabetic chronic kidney disease: Secondary | ICD-10-CM | POA: Diagnosis not present

## 2018-02-19 DIAGNOSIS — N183 Chronic kidney disease, stage 3 (moderate): Secondary | ICD-10-CM | POA: Diagnosis not present

## 2018-02-19 DIAGNOSIS — I5022 Chronic systolic (congestive) heart failure: Secondary | ICD-10-CM | POA: Diagnosis not present

## 2018-02-19 DIAGNOSIS — E876 Hypokalemia: Secondary | ICD-10-CM | POA: Diagnosis not present

## 2018-02-19 DIAGNOSIS — I129 Hypertensive chronic kidney disease with stage 1 through stage 4 chronic kidney disease, or unspecified chronic kidney disease: Secondary | ICD-10-CM | POA: Diagnosis not present

## 2018-02-24 ENCOUNTER — Telehealth: Payer: Self-pay | Admitting: *Deleted

## 2018-02-24 ENCOUNTER — Ambulatory Visit: Payer: Medicare Other | Admitting: Cardiology

## 2018-02-24 VITALS — BP 124/68 | HR 72 | Ht 69.5 in | Wt 273.0 lb

## 2018-02-24 DIAGNOSIS — Z9989 Dependence on other enabling machines and devices: Secondary | ICD-10-CM

## 2018-02-24 DIAGNOSIS — G4733 Obstructive sleep apnea (adult) (pediatric): Secondary | ICD-10-CM

## 2018-02-24 DIAGNOSIS — I10 Essential (primary) hypertension: Secondary | ICD-10-CM

## 2018-02-24 NOTE — Patient Instructions (Signed)
Medication Instructions:  Your physician recommends that you continue on your current medications as directed. Please refer to the Current Medication list given to you today.  If you need a refill on your cardiac medications, please contact your pharmacy first.  Labwork: None ordered   Testing/Procedures: None ordered   Follow-Up: Your physician recommends that you schedule a follow-up appointment in: 3 months with Dr. Radford Pax   Any Other Special Instructions Will Be Listed Below (If Applicable).   Thank you for choosing [facility], RN  [phone]  If you need a refill on your cardiac medications before your next appointment, please call your pharmacy.

## 2018-02-24 NOTE — Progress Notes (Addendum)
Cardiology Office Note:    Date:  02/24/2018   ID:  [name], DOB Jan 26, 1941, MRN [id number]  PCP:  Carlyle Dolly, MD  Cardiologist:  Fransico Him, MD    Referring MD: Carlyle Dolly, MD   No chief complaint on file.   History of Present Illness:    [name] is a 77 y.o. female with a hx of CHF with ischemic DCM, ASCAD, HTN who underwent home sleep study 07/2017 showing severe OSA with an AHI of 34/hr.  This was ordered due to excessive daytime sleepiness and snoring.  She subsequently underwent CPAP titration and is now on CPAP.  She had been on CPAP but at last office visit had a high AHI of 24.1/h on auto CPAP and was sent to the sleep lab for re-titration.  Unfortunately she could not be adequately titrated in the lab and was started on BiPAP on auto.  Unfortunately she was placed back on auto CPAP and not auto BiPAP.   Past Medical History:  Diagnosis Date  . ACC/AHA stage C congestive heart failure due to ischemic cardiomyopathy ([name]) 01/31/2009   Qualifier: Diagnosis of  By: [name] Perches, MD, [name]   . ANXIETY 01/31/2009   Qualifier: Diagnosis of  By: [name] Cliche, CNA, Christy    . Arthritis    "qwhere" (03/30/2016)  . Benign essential HTN   . CAD (coronary artery disease) 05/2008   a. s/p CABG in 2009.  Marland Kitchen Cerebrovascular accident (CVA) due to embolism of right middle cerebral artery ([place])   . Chronic anticoagulation   . Chronic kidney disease (CKD), stage III (moderate) (HCC)   . Chronic lower back pain   . Chronic pain syndrome   . Chronic systolic CHF (congestive heart failure) ([place])   . DM type 2 with diabetic peripheral neuropathy ([place])   . Facial weakness, post-stroke   . Gastrointestinal hemorrhage 03/26/2017  . Hemiparesis ([name])   . History of CVA with residual deficit 03/26/2017  . Hyperlipidemia   . Hypertension   . Ischemic cardiomyopathy   . Longstanding persistent atrial fibrillation ([place])    a. Postoperative in 2009;  S/P  transesophageal echocardiography-guided cardioversion, previously on Amiodarone and Coumadin. b. recurrent in April 2013 -> pt elected rate control strategy, initially Coumadin -> had stroke with subtherapeutic INR in 03/2016 and transitioned to Eliquis.  . Morbid obesity ([place]) 03/26/2017  . OSA on CPAP    severe OSA with AHI 34/hr  . Persistent atrial fibrillation ([name])   . Thrombocytopenia ([place])     Past Surgical History:  Procedure Laterality Date  . APPENDECTOMY  1946  . CATARACT EXTRACTION Right 2012  . COLONOSCOPY WITH PROPOFOL Left 03/27/2017   Procedure: COLONOSCOPY WITH PROPOFOL;  Surgeon: Ronnette Juniper, MD;  Location: [facility];  Service: Gastroenterology;  Laterality: Left;  . CORONARY ANGIOPLASTY WITH STENT PLACEMENT  2009   "put 2 stents in"  . HERNIA REPAIR    . JOINT REPLACEMENT    . TEE WITH CARDIOVERSION     Postoperative in 2009;  S/P transesophageal echocardiography-guided cardioversion,  . TOTAL KNEE ARTHROPLASTY Right 1994  . VENTRAL HERNIA REPAIR  10/1999   With mesh    Current Medications: Current Meds  Medication Sig  . atorvastatin (LIPITOR) 40 MG tablet Take 1 tablet (40 mg total) by mouth daily at 6 PM.  . carvedilol (COREG) 6.25 MG tablet Take 1 tablet (6.25 mg total) by mouth 2 (two) times daily with a meal.  . cholecalciferol (  VITAMIN D) 1000 UNITS tablet Take 1,000 Units by mouth 2 (two) times daily.  Marland Kitchen docusate sodium (COLACE) 100 MG capsule Take 200 mg by mouth at bedtime.   Marland Kitchen ELIQUIS 5 MG TABS tablet TAKE ONE (1) TABLET BY MOUTH TWO (2) TIMES DAILY  . gabapentin (NEURONTIN) 400 MG capsule Take 400 mg by mouth 2 (two) times daily.  . insulin glargine (LANTUS) 100 UNIT/ML injection Inject 0.23 mLs (23 Units total) into the skin at bedtime.  . metolazone (ZAROXOLYN) 2.5 MG tablet Take 1 tablet (2.5 mg total) by mouth daily as needed. Take extra 20 meq potassium tab. Must call CHF clinic before taking ([phone])  . nitroGLYCERIN (NITROSTAT) 0.4 MG  SL tablet Place 0.4 mg under the tongue every 5 (five) minutes as needed for chest pain (up to 3 doses).   . potassium chloride SA (K-DUR,KLOR-CON) 20 MEQ tablet Take 2 tablets (40 mEq total) by mouth daily.  Marland Kitchen rOPINIRole (REQUIP) 0.5 MG tablet TAKE 2 TABLETS BY MOUTH AT  BEDTIME  . spironolactone (ALDACTONE) 25 MG tablet Take 0.5 tablets (12.5 mg total) by mouth daily.  Marland Kitchen torsemide (DEMADEX) 20 MG tablet Take 3 tablets (60 mg total) by mouth 2 (two) times daily.  . traMADol (ULTRAM) 50 MG tablet TAKE TWO (2) TABLETS BY MOUTH EVERY MORNING AND TAKE ONE TABLET AT NIGHT  . vitamin B-12 (CYANOCOBALAMIN) 1000 MCG tablet Take 1 tablet (1,000 mcg total) by mouth daily.     Allergies:   Benazepril; Angiotensin receptor blockers; Fe-succ-c-thre-b12-des stomach; Spironolactone; and Sulfamethoxazole-trimethoprim   Social History   Socioeconomic History  . Marital status: Widowed    Spouse name: Not on file  . Number of children: Not on file  . Years of education: Not on file  . Highest education level: Not on file  Occupational History  . Not on file  Social Needs  . Financial resource strain: Not on file  . Food insecurity:    Worry: Not on file    Inability: Not on file  . Transportation needs:    Medical: Not on file    Non-medical: Not on file  Tobacco Use  . Smoking status: Never Smoker  . Smokeless tobacco: Never Used  Substance and Sexual Activity  . Alcohol use: No  . Drug use: No  . Sexual activity: Not Currently  Lifestyle  . Physical activity:    Days per week: Not on file    Minutes per session: Not on file  . Stress: Not on file  Relationships  . Social connections:    Talks on phone: Not on file    Gets together: Not on file    Attends religious service: Not on file    Active member of club or organization: Not on file    Attends meetings of clubs or organizations: Not on file    Relationship status: Not on file  Other Topics Concern  . Not on file  Social  History Narrative   Widowed   Lives alone in [place]   2 children   Not routinely exercising     Family History: The patient's family history includes Clotting disorder in her mother; Emphysema in her father; Lung cancer in her brother. There is no history of Heart disease.  ROS:   Please see the history of present illness.    ROS  All other systems reviewed and negative.   EKGs/Labs/Other Studies Reviewed:    The following studies were reviewed today: none  EKG:  EKG is  not ordered today.   Recent Labs: 03/25/2017: ALT 14; B Natriuretic Peptide 149.9 07/19/2017: Hemoglobin 10.1; Platelets 113 01/29/2018: BUN 37; Creatinine, Ser 1.35; Potassium 4.2; Sodium 139   Recent Lipid Panel    Component Value Date/Time   CHOL 102 03/30/2016 0740   TRIG 50 03/30/2016 0740   HDL 39 (L) 03/30/2016 0740   CHOLHDL 2.6 03/30/2016 0740   VLDL 10 03/30/2016 0740   LDLCALC 53 03/30/2016 0740    Physical Exam:    VS:  BP 124/68   Pulse 72   Ht 5' 9.5" (1.765 m)   Wt 273 lb (123.8 kg)   LMP  (LMP Unknown)   BMI 39.74 kg/m      Wt Readings from Last 3 Encounters:  02/24/18 273 lb (123.8 kg)  01/29/18 271 lb (122.9 kg)  01/14/18 284 lb (128.8 kg)     GEN:  Well nourished, well developed in no acute distress HEENT: Normal NECK: No JVD; No carotid bruits LYMPHATICS: No lymphadenopathy CARDIAC: RRR, no murmurs, rubs, gallops RESPIRATORY:  Clear to auscultation without rales, wheezing or rhonchi  ABDOMEN: Soft, non-tender, non-distended MUSCULOSKELETAL:  No edema; No deformity  SKIN: Warm and dry NEUROLOGIC:  Alert and oriented x 3 PSYCHIATRIC:  Normal affect   ASSESSMENT:    1. OSA on CPAP   2. Benign essential HTN   3. Morbid obesity ([place])    PLAN:    In order of problems listed above:  1.  OSA -she went back to the lab for a repeat PAP titration but unfortunately not be adequately titrated and was supposed to have been placed on BiPAP auto but unfortunately her  medical [place] did not do this.  I will place her on auto BiPAP and see if we can get her adequately titrated.  Her AHI today is 27.6/hr on auto CPAP from 6-18 cm of water pressure that she is compliant at 70% usage more than 4 hours nightly  2.  HTN -blood pressures well controlled on exam today.  3.  Obesity - her exercise is limited due to inability to walk without a walker  Medication Adjustments/Labs and Tests Ordered: Current medicines are reviewed at length with the patient today.  Concerns regarding medicines are outlined above.  No orders of the defined types were placed in this encounter.  No orders of the defined types were placed in this encounter.   Signed, Fransico Him, MD  02/24/2018 8:05 AM    [place]

## 2018-02-24 NOTE — Telephone Encounter (Signed)
  Teressa Senter, RN  Freada Bergeron, CMA        dme order placed   Thanks    Auto BIPAP IPAP 18 cm water pressure EPAP 6 cm water pressure PS 5 cm water pressure   And chin strap  Order placed today to [facility]

## 2018-02-25 NOTE — Telephone Encounter (Addendum)
Hello Dr Radford Pax, Per [facility]) patient needs a cpap Titration then  BiPAP  Titration because she is out of her 90 day compliance already.

## 2018-02-25 NOTE — Progress Notes (Signed)
Advanced Heart Failure Clinic Note   Referring Physician: Dr. Angelena Form Primary Care: Dr. Juanito Doom Primary Cardiologist: Dr. Angelena Form HF: New  HPI:  [name] is a 77 y.o. female [name] is a 77 y.o. female with history of CAD status post CABG 2009, ischemic cardiomyopathy with chronic systolic CHF, persistent atrial fibrillation with stroke in 03/2016 when INR was subtherapeutic, stage III CKD, morbid obesity, pulmonary hypertension, diabetes, GI bleed 03/2017, and severe sleep apnea.   Seen in [facility] clinics 10/30 and 11/13, both times with volume overload and marked peripheral edema with skin breakdown. Torsemide increased at each visit.   She presents today for regular follow up. Last visit spiro added. F/u labs stable. She is feeling great overall. Last week had 5 lb weight gain that resolved with metolazone x 1. Otherwise she has not needed extra. She had tooth extraction x 3 last week so was taking in more fluid than normal, but is on the mend.  She denies SOB, lightheadedness or dizziness. No CP or orthopnea. Wearing CPAP nightly.  She confirms that on losartan in the past it was stopped after she had her stroke, and she doesn't remember any specific problems with it.   Labs 10/14/17 K 3.9, Creatinine 1.65  Echo 09/30/2017 LVEF 35-40%, severe RV dilation, severe RAE, Severe TR, PA peak pressure.   Review of systems complete and found to be negative unless listed in HPI.    Past Medical History 1. Chronic systolic CHF due to ICM ->  R>L CHF - Echo 09/30/2017 LVEF 35-40%, severe RV dilation, severe RAE, Severe TR, PA peak pressure.  2. CAD sp CABG 2009 - No chest pain. No angiography since with lack of symptoms.  3. HTN 4. Persistent Afib - She has been rate controlled since 2013. Unlikely to cardiovert, and symptoms stable.  5. CKD stage 3 6. Morbid obesity 7. OSA on CPAP 8. Chronic venous stasis with RLE wound/Bullae 9. RCA CVA 2018 - No major deficits. On coumadin  long-term for Afib.    Past Medical History:  Diagnosis Date  . ACC/AHA stage C congestive heart failure due to ischemic cardiomyopathy ([place]) 01/31/2009   Qualifier: Diagnosis of  By: Olevia Perches, MD, Glenetta Hew   . ANXIETY 01/31/2009   Qualifier: Diagnosis of  By: Lovette Cliche, CNA, Christy    . Arthritis    "qwhere" (03/30/2016)  . Benign essential HTN   . CAD (coronary artery disease) 05/2008   a. s/p CABG in 2009.  Marland Kitchen Cerebrovascular accident (CVA) due to embolism of right middle cerebral artery ([place])   . Chronic anticoagulation   . Chronic kidney disease (CKD), stage III (moderate) (HCC)   . Chronic lower back pain   . Chronic pain syndrome   . Chronic systolic CHF (congestive heart failure) ([place])   . DM type 2 with diabetic peripheral neuropathy ([place])   . Facial weakness, post-stroke   . Gastrointestinal hemorrhage 03/26/2017  . Hemiparesis ([place])   . History of CVA with residual deficit 03/26/2017  . Hyperlipidemia   . Hypertension   . Ischemic cardiomyopathy   . Longstanding persistent atrial fibrillation ([place])    a. Postoperative in 2009;  S/P transesophageal echocardiography-guided cardioversion, previously on Amiodarone and Coumadin. b. recurrent in April 2013 -> pt elected rate control strategy, initially Coumadin -> had stroke with subtherapeutic INR in 03/2016 and transitioned to Eliquis.  . Morbid obesity ([place]) 03/26/2017  . OSA on CPAP    severe OSA with AHI 34/hr  .  Persistent atrial fibrillation ([place])   . Thrombocytopenia ([place])     Current Outpatient Medications  Medication Sig Dispense Refill  . atorvastatin (LIPITOR) 40 MG tablet Take 1 tablet (40 mg total) by mouth daily at 6 PM. 90 tablet 1  . carvedilol (COREG) 6.25 MG tablet Take 1 tablet (6.25 mg total) by mouth 2 (two) times daily with a meal. 60 tablet 6  . cholecalciferol (VITAMIN D) 1000 UNITS tablet Take 1,000 Units by mouth 2 (two) times daily.    Marland Kitchen docusate sodium (COLACE) 100 MG capsule Take 200 mg by  mouth at bedtime.     Marland Kitchen ELIQUIS 5 MG TABS tablet TAKE ONE (1) TABLET BY MOUTH TWO (2) TIMES DAILY 60 tablet 0  . gabapentin (NEURONTIN) 400 MG capsule Take 400 mg by mouth 2 (two) times daily.    . insulin glargine (LANTUS) 100 UNIT/ML injection Inject 0.23 mLs (23 Units total) into the skin at bedtime. 10 mL 2  . metolazone (ZAROXOLYN) 2.5 MG tablet Take 1 tablet (2.5 mg total) by mouth daily as needed. Take extra 20 meq potassium tab. Must call CHF clinic before taking ([phone]) 30 tablet 3  . nitroGLYCERIN (NITROSTAT) 0.4 MG SL tablet Place 0.4 mg under the tongue every 5 (five) minutes as needed for chest pain (up to 3 doses).     . potassium chloride SA (K-DUR,KLOR-CON) 20 MEQ tablet Take 2 tablets (40 mEq total) by mouth daily. 60 tablet 11  . rOPINIRole (REQUIP) 0.5 MG tablet TAKE 2 TABLETS BY MOUTH AT  BEDTIME 180 tablet 0  . spironolactone (ALDACTONE) 25 MG tablet Take 0.5 tablets (12.5 mg total) by mouth daily. 45 tablet 3  . torsemide (DEMADEX) 20 MG tablet Take 3 tablets (60 mg total) by mouth 2 (two) times daily. 180 tablet 3  . traMADol (ULTRAM) 50 MG tablet TAKE TWO (2) TABLETS BY MOUTH EVERY MORNING AND TAKE ONE TABLET AT NIGHT 90 tablet 0  . vitamin B-12 (CYANOCOBALAMIN) 1000 MCG tablet Take 1 tablet (1,000 mcg total) by mouth daily. 90 tablet 0   No current facility-administered medications for this encounter.    Allergies  Allergen Reactions  . Benazepril Other (See Comments)    Unknown reaction at age 65-65 - possibly dizziness  . Angiotensin Receptor Blockers Other (See Comments)    Hypotension reaction  . Fe-Succ-C-Thre-B12-Des Stomach Other (See Comments)    Unknown reaction  . Spironolactone Other (See Comments)    Dizziness   . Sulfamethoxazole-Trimethoprim Other (See Comments)    Unknown reaction   Social History   Socioeconomic History  . Marital status: Widowed    Spouse name: Not on file  . Number of children: Not on file  . Years of education: Not  on file  . Highest education level: Not on file  Occupational History  . Not on file  Social Needs  . Financial resource strain: Not on file  . Food insecurity:    Worry: Not on file    Inability: Not on file  . Transportation needs:    Medical: Not on file    Non-medical: Not on file  Tobacco Use  . Smoking status: Never Smoker  . Smokeless tobacco: Never Used  Substance and Sexual Activity  . Alcohol use: No  . Drug use: No  . Sexual activity: Not Currently  Lifestyle  . Physical activity:    Days per week: Not on file    Minutes per session: Not on file  . Stress: Not on  file  Relationships  . Social connections:    Talks on phone: Not on file    Gets together: Not on file    Attends religious service: Not on file    Active member of club or organization: Not on file    Attends meetings of clubs or organizations: Not on file    Relationship status: Not on file  . Intimate partner violence:    Fear of current or ex partner: Not on file    Emotionally abused: Not on file    Physically abused: Not on file    Forced sexual activity: Not on file  Other Topics Concern  . Not on file  Social History Narrative   Widowed   Lives alone in [place]   2 children   Not routinely exercising   Family History  Problem Relation Age of Onset  . Clotting disorder Mother        Cerebral hemorrhage  . Emphysema Father        COD  . Lung cancer Brother   . Heart disease Neg Hx    Vitals:   02/26/18 0834  BP: 128/64  Pulse: 74  SpO2: 98%  Weight: 270 lb (122.5 kg)     Wt Readings from Last 3 Encounters:  02/26/18 270 lb (122.5 kg)  02/24/18 273 lb (123.8 kg)  01/29/18 271 lb (122.9 kg)    PHYSICAL EXAM: General: Well appearing. No resp difficulty. HEENT: Normal Neck: Supple. JVP 6-7 cm. Carotids 2+ bilat; no bruits. No thyromegaly or nodule noted. Cor: PMI nondisplaced. RRR, No M/G/R noted Lungs: CTAB, normal effort. Abdomen: Soft, non-tender, non-distended, no  HSM. No bruits or masses. +BS  Extremities: No cyanosis, clubbing, or rash. Trace ankle edema.  Neuro: Alert & orientedx3, cranial nerves grossly intact. moves all 4 extremities w/o difficulty. Affect pleasant   ASSESSMENT & PLAN:  1. Chronic systolic CHF due to ICM ->  R>L CHF - Echo 09/30/2017 LVEF 35-40%, severe RV dilation, severe RAE, Severe TR, PA peak pressure.  - NYHA II - early III. - Volume status stable on exam.  - Continue torsemide 60 mg BID. - Continue coreg 6.25 mg BID - Continue spiro 12.5 mg daily. BMET today.  - Will try on losartan 12.5 qhs. Pt knows to call with any lightheadedness  - Reinforced fluid restriction to < 2 L daily, sodium restriction to less than 2000 mg daily, and the importance of daily weights.    2. CAD sp CABG - No s/s of ischemia.    - Continue atorvastatin 40 mg daily.  - No ASA with stable CAD on Eliquis.   3. HTN - Meds as above.   4. Persistent Afib - Rate controlled.  - Denies bleeding on Eliquis.   5. CKD stage 3 - BMET today.   6. Morbid obesity - Body mass index is 39.3 kg/m.  - Encouraged importance of weight loss.   7. Chronic venous stasis with RLE wound/Bullae - Stable after diuresis.   8. RCA CVA 2018 - No major deficits. On coumadin long-term for Afib.   9. OSA on CPAP - Encouraged nightly use.   Doing much better with diuresis. Meds as above. 4 week follow up. Labs today and 10 days. Plan MD visit with Echo in July.   Shirley Friar, PA-C 02/26/18   Advanced Heart Failure Team Pager [phone] (M-F; 7a - 4p)  Please contact [facility] for night-coverage after hours (4p -7a ) and weekends on [email or web address]

## 2018-02-26 ENCOUNTER — Ambulatory Visit (HOSPITAL_COMMUNITY)
Admission: RE | Admit: 2018-02-26 | Discharge: 2018-02-26 | Disposition: A | Discharge status: Home / Self Care | Payer: Medicare Other | Source: Ambulatory Visit | Attending: Internal Medicine | Admitting: Internal Medicine

## 2018-02-26 ENCOUNTER — Encounter (HOSPITAL_COMMUNITY): Payer: Self-pay

## 2018-02-26 VITALS — BP 128/64 | HR 74 | Wt 270.0 lb

## 2018-02-26 DIAGNOSIS — I25810 Atherosclerosis of coronary artery bypass graft(s) without angina pectoris: Secondary | ICD-10-CM

## 2018-02-26 DIAGNOSIS — Z951 Presence of aortocoronary bypass graft: Secondary | ICD-10-CM | POA: Insufficient documentation

## 2018-02-26 DIAGNOSIS — I878 Other specified disorders of veins: Secondary | ICD-10-CM | POA: Diagnosis not present

## 2018-02-26 DIAGNOSIS — I4819 Other persistent atrial fibrillation: Secondary | ICD-10-CM

## 2018-02-26 DIAGNOSIS — I255 Ischemic cardiomyopathy: Secondary | ICD-10-CM | POA: Diagnosis not present

## 2018-02-26 DIAGNOSIS — Z79899 Other long term (current) drug therapy: Secondary | ICD-10-CM | POA: Insufficient documentation

## 2018-02-26 DIAGNOSIS — Z7901 Long term (current) use of anticoagulants: Secondary | ICD-10-CM | POA: Diagnosis not present

## 2018-02-26 DIAGNOSIS — N183 Chronic kidney disease, stage 3 unspecified: Secondary | ICD-10-CM

## 2018-02-26 DIAGNOSIS — E1122 Type 2 diabetes mellitus with diabetic chronic kidney disease: Secondary | ICD-10-CM | POA: Insufficient documentation

## 2018-02-26 DIAGNOSIS — E785 Hyperlipidemia, unspecified: Secondary | ICD-10-CM | POA: Insufficient documentation

## 2018-02-26 DIAGNOSIS — G894 Chronic pain syndrome: Secondary | ICD-10-CM | POA: Diagnosis not present

## 2018-02-26 DIAGNOSIS — I130 Hypertensive heart and chronic kidney disease with heart failure and stage 1 through stage 4 chronic kidney disease, or unspecified chronic kidney disease: Secondary | ICD-10-CM | POA: Diagnosis not present

## 2018-02-26 DIAGNOSIS — Z9989 Dependence on other enabling machines and devices: Secondary | ICD-10-CM

## 2018-02-26 DIAGNOSIS — Z882 Allergy status to sulfonamides status: Secondary | ICD-10-CM | POA: Insufficient documentation

## 2018-02-26 DIAGNOSIS — I272 Pulmonary hypertension, unspecified: Secondary | ICD-10-CM | POA: Insufficient documentation

## 2018-02-26 DIAGNOSIS — I481 Persistent atrial fibrillation: Secondary | ICD-10-CM | POA: Diagnosis not present

## 2018-02-26 DIAGNOSIS — Z8673 Personal history of transient ischemic attack (TIA), and cerebral infarction without residual deficits: Secondary | ICD-10-CM | POA: Insufficient documentation

## 2018-02-26 DIAGNOSIS — I10 Essential (primary) hypertension: Secondary | ICD-10-CM

## 2018-02-26 DIAGNOSIS — Z794 Long term (current) use of insulin: Secondary | ICD-10-CM | POA: Diagnosis not present

## 2018-02-26 DIAGNOSIS — Z6839 Body mass index (BMI) 39.0-39.9, adult: Secondary | ICD-10-CM | POA: Diagnosis not present

## 2018-02-26 DIAGNOSIS — F419 Anxiety disorder, unspecified: Secondary | ICD-10-CM | POA: Diagnosis not present

## 2018-02-26 DIAGNOSIS — G4733 Obstructive sleep apnea (adult) (pediatric): Secondary | ICD-10-CM

## 2018-02-26 DIAGNOSIS — I2510 Atherosclerotic heart disease of native coronary artery without angina pectoris: Secondary | ICD-10-CM | POA: Diagnosis not present

## 2018-02-26 DIAGNOSIS — I5022 Chronic systolic (congestive) heart failure: Secondary | ICD-10-CM | POA: Diagnosis not present

## 2018-02-26 DIAGNOSIS — E1142 Type 2 diabetes mellitus with diabetic polyneuropathy: Secondary | ICD-10-CM

## 2018-02-26 MED ORDER — LOSARTAN POTASSIUM 25 MG PO TABS: 12.5000 mg | ORAL_TABLET | Freq: Every day | ORAL | 11 refills | Status: DC

## 2018-02-26 MED ORDER — APIXABAN 5 MG PO TABS: ORAL_TABLET | ORAL | 11 refills | Status: DC

## 2018-02-26 NOTE — Telephone Encounter (Signed)
Will insurance pay for inlab CPAP titration - if so please set up

## 2018-02-26 NOTE — Patient Instructions (Signed)
START Losartan 12.5 mg (1/2 tablet) once daily at bedtime.  Return in 1-2 weeks for labs.  Follow up 4 weeks with Oda Kilts PA-C.  Follow up 3 months with Dr. Aundra Dubin and echocardiogram.  Take all medication as prescribed the day of your appointment. Bring all medications with you to your appointment.  Do the following things EVERYDAY: 1) Weigh yourself in the morning before breakfast. Write it down and keep it in a log. 2) Take your medicines as prescribed 3) Eat low salt foods-Limit salt (sodium) to 2000 mg per day.  4) Stay as active as you can everyday 5) Limit all fluids for the day to less than 2 liters

## 2018-02-27 ENCOUNTER — Other Ambulatory Visit: Payer: Self-pay | Admitting: Family Medicine

## 2018-02-27 NOTE — Telephone Encounter (Signed)
Sent to precert 

## 2018-02-27 NOTE — Addendum Note (Signed)
Addended by: Freada Bergeron on: 02/27/2018 05:31 PM   Modules accepted: Orders

## 2018-02-27 NOTE — Telephone Encounter (Signed)
  [name] Margarita, MD  Freada Bergeron, CMA        Please set up CPAP titration in lab   [place]

## 2018-02-28 ENCOUNTER — Telehealth: Payer: Self-pay | Admitting: *Deleted

## 2018-02-28 NOTE — Telephone Encounter (Signed)
Message sent to Gae Bon ok to schedule in lab CPAP titration study. No PA required per [employer]. Decision ID# [id number].

## 2018-02-28 NOTE — Telephone Encounter (Signed)
-----   Message from Freada Bergeron, Silv[place] sent at 02/27/2018  5:30 PM EDT ----- Regarding: pre cert Please set up CPAP titration in lab

## 2018-03-03 ENCOUNTER — Telehealth: Payer: Self-pay | Admitting: Cardiology

## 2018-03-03 ENCOUNTER — Encounter: Payer: Self-pay | Admitting: *Deleted

## 2018-03-03 NOTE — Telephone Encounter (Signed)
Patient is scheduled for Titration on 03/25/18 .Patient understands her sleep study will be done at [facility] sleep lab. Patient understands she will receive a sleep packet in a week or so. Patient understands to call if she does not receive the sleep packet in a timely manner. Patient agrees with treatment and thanked me for call.

## 2018-03-03 NOTE — Telephone Encounter (Signed)
Patient is scheduled for Titration on 03/25/18 .Patient understands her sleep study will be done at [facility] sleep lab. Patient understands she will receive a sleep packet in a week or so. Patient understands to call if she does not receive the sleep packet in a timely manner. Patient agrees with treatment and thanked me for call.

## 2018-03-03 NOTE — Telephone Encounter (Signed)
New Message:     Pt states is calling to inquire on her Cpap machine. Pt states she was told by dr if she hadn't heard anything within a week of her appt to call and see what is going on

## 2018-03-04 ENCOUNTER — Telehealth (HOSPITAL_COMMUNITY): Payer: Self-pay | Admitting: *Deleted

## 2018-03-04 DIAGNOSIS — G4733 Obstructive sleep apnea (adult) (pediatric): Secondary | ICD-10-CM | POA: Diagnosis not present

## 2018-03-04 NOTE — Telephone Encounter (Signed)
Patient called c/o 5lb weight gain overnight a total of 8lbs in two days. She has swelling in both legs she said they are "swollen like a balloon". Patient asked if she could take her prn metolazone. Per Jonni Sanger she can take her metolazone pt has a lab appt in the AM.

## 2018-03-05 ENCOUNTER — Ambulatory Visit (HOSPITAL_COMMUNITY)
Admission: RE | Admit: 2018-03-05 | Discharge: 2018-03-05 | Disposition: A | Discharge status: Home / Self Care | Payer: Medicare Other | Source: Ambulatory Visit | Attending: Internal Medicine | Admitting: Internal Medicine

## 2018-03-05 DIAGNOSIS — I5022 Chronic systolic (congestive) heart failure: Secondary | ICD-10-CM

## 2018-03-05 LAB — BASIC METABOLIC PANEL
ANION GAP: 9 (ref 5–15)
BUN: 62 mg/dL — ABNORMAL HIGH (ref 6–20)
CO2: 28 mmol/L (ref 22–32)
Calcium: 9.9 mg/dL (ref 8.9–10.3)
Chloride: 101 mmol/L (ref 101–111)
Creatinine, Ser: 1.7 mg/dL — ABNORMAL HIGH (ref 0.44–1.00)
GFR calc Af Amer: 32 mL/min — ABNORMAL LOW (ref >60)
GFR, EST NON AFRICAN AMERICAN: 28 mL/min — AB (ref >60)
Glucose, Bld: 117 mg/dL — ABNORMAL HIGH (ref 65–99)
POTASSIUM: 4.1 mmol/L (ref 3.5–5.1)
SODIUM: 138 mmol/L (ref 135–145)

## 2018-03-10 DIAGNOSIS — H3562 Retinal hemorrhage, left eye: Secondary | ICD-10-CM | POA: Diagnosis not present

## 2018-03-10 DIAGNOSIS — E113391 Type 2 diabetes mellitus with moderate nonproliferative diabetic retinopathy without macular edema, right eye: Secondary | ICD-10-CM | POA: Diagnosis not present

## 2018-03-10 DIAGNOSIS — H353221 Exudative age-related macular degeneration, left eye, with active choroidal neovascularization: Secondary | ICD-10-CM | POA: Diagnosis not present

## 2018-03-10 DIAGNOSIS — H43822 Vitreomacular adhesion, left eye: Secondary | ICD-10-CM | POA: Diagnosis not present

## 2018-03-18 ENCOUNTER — Other Ambulatory Visit: Payer: Self-pay | Admitting: Family Medicine

## 2018-03-19 NOTE — Telephone Encounter (Signed)
1 refill provided. [facility] team please have patient schedule an appointment so we can discuss diabetes. Thank you.

## 2018-03-25 ENCOUNTER — Ambulatory Visit (HOSPITAL_BASED_OUTPATIENT_CLINIC_OR_DEPARTMENT_OTHER): Payer: Medicare Other | Attending: Cardiology | Admitting: Cardiology

## 2018-03-25 VITALS — Ht 69.0 in | Wt 264.0 lb

## 2018-03-25 DIAGNOSIS — I4891 Unspecified atrial fibrillation: Secondary | ICD-10-CM | POA: Diagnosis not present

## 2018-03-25 DIAGNOSIS — G4733 Obstructive sleep apnea (adult) (pediatric): Secondary | ICD-10-CM | POA: Insufficient documentation

## 2018-03-26 ENCOUNTER — Ambulatory Visit (HOSPITAL_COMMUNITY)
Admission: RE | Admit: 2018-03-26 | Discharge: 2018-03-26 | Disposition: A | Discharge status: Home / Self Care | Payer: Medicare Other | Source: Ambulatory Visit | Attending: Cardiology | Admitting: Cardiology

## 2018-03-26 ENCOUNTER — Encounter (HOSPITAL_COMMUNITY): Payer: Self-pay

## 2018-03-26 VITALS — BP 114/60 | HR 73 | Wt 267.4 lb

## 2018-03-26 DIAGNOSIS — N183 Chronic kidney disease, stage 3 unspecified: Secondary | ICD-10-CM

## 2018-03-26 DIAGNOSIS — I272 Pulmonary hypertension, unspecified: Secondary | ICD-10-CM | POA: Diagnosis not present

## 2018-03-26 DIAGNOSIS — I4819 Other persistent atrial fibrillation: Secondary | ICD-10-CM

## 2018-03-26 DIAGNOSIS — Z794 Long term (current) use of insulin: Secondary | ICD-10-CM | POA: Diagnosis not present

## 2018-03-26 DIAGNOSIS — I130 Hypertensive heart and chronic kidney disease with heart failure and stage 1 through stage 4 chronic kidney disease, or unspecified chronic kidney disease: Secondary | ICD-10-CM | POA: Diagnosis not present

## 2018-03-26 DIAGNOSIS — E1142 Type 2 diabetes mellitus with diabetic polyneuropathy: Secondary | ICD-10-CM | POA: Diagnosis not present

## 2018-03-26 DIAGNOSIS — Z79899 Other long term (current) drug therapy: Secondary | ICD-10-CM | POA: Insufficient documentation

## 2018-03-26 DIAGNOSIS — I2510 Atherosclerotic heart disease of native coronary artery without angina pectoris: Secondary | ICD-10-CM | POA: Insufficient documentation

## 2018-03-26 DIAGNOSIS — G894 Chronic pain syndrome: Secondary | ICD-10-CM | POA: Diagnosis not present

## 2018-03-26 DIAGNOSIS — Z7901 Long term (current) use of anticoagulants: Secondary | ICD-10-CM | POA: Diagnosis not present

## 2018-03-26 DIAGNOSIS — Z951 Presence of aortocoronary bypass graft: Secondary | ICD-10-CM | POA: Insufficient documentation

## 2018-03-26 DIAGNOSIS — I10 Essential (primary) hypertension: Secondary | ICD-10-CM

## 2018-03-26 DIAGNOSIS — I69359 Hemiplegia and hemiparesis following cerebral infarction affecting unspecified side: Secondary | ICD-10-CM | POA: Insufficient documentation

## 2018-03-26 DIAGNOSIS — I5022 Chronic systolic (congestive) heart failure: Secondary | ICD-10-CM | POA: Diagnosis not present

## 2018-03-26 DIAGNOSIS — Z6839 Body mass index (BMI) 39.0-39.9, adult: Secondary | ICD-10-CM | POA: Insufficient documentation

## 2018-03-26 DIAGNOSIS — Z9989 Dependence on other enabling machines and devices: Secondary | ICD-10-CM | POA: Diagnosis not present

## 2018-03-26 DIAGNOSIS — G4733 Obstructive sleep apnea (adult) (pediatric): Secondary | ICD-10-CM | POA: Diagnosis not present

## 2018-03-26 DIAGNOSIS — I25810 Atherosclerosis of coronary artery bypass graft(s) without angina pectoris: Secondary | ICD-10-CM | POA: Diagnosis not present

## 2018-03-26 DIAGNOSIS — F419 Anxiety disorder, unspecified: Secondary | ICD-10-CM | POA: Insufficient documentation

## 2018-03-26 DIAGNOSIS — I255 Ischemic cardiomyopathy: Secondary | ICD-10-CM | POA: Diagnosis not present

## 2018-03-26 DIAGNOSIS — I878 Other specified disorders of veins: Secondary | ICD-10-CM | POA: Diagnosis not present

## 2018-03-26 DIAGNOSIS — I481 Persistent atrial fibrillation: Secondary | ICD-10-CM

## 2018-03-26 DIAGNOSIS — E785 Hyperlipidemia, unspecified: Secondary | ICD-10-CM | POA: Diagnosis not present

## 2018-03-26 LAB — BASIC METABOLIC PANEL
Anion gap: 12 (ref 5–15)
BUN: 63 mg/dL — AB (ref 6–20)
CALCIUM: 9.8 mg/dL (ref 8.9–10.3)
CHLORIDE: 99 mmol/L — AB (ref 101–111)
CO2: 28 mmol/L (ref 22–32)
CREATININE: 1.71 mg/dL — AB (ref 0.44–1.00)
GFR calc Af Amer: 32 mL/min — ABNORMAL LOW (ref >60)
GFR calc non Af Amer: 27 mL/min — ABNORMAL LOW (ref >60)
GLUCOSE: 61 mg/dL — AB (ref 65–99)
Potassium: 4.1 mmol/L (ref 3.5–5.1)
Sodium: 139 mmol/L (ref 135–145)

## 2018-03-26 NOTE — Procedures (Signed)
   Patient Name: [name], [name] Date: 03/25/2018 Gender: Female D.O.B: [birth year] Age (years): 37 Referring Provider: Fransico Him MD, ABSM Height (inches): 69 Interpreting Physician: Fransico Him MD, ABSM Weight (lbs): 264 RPSGT: Carolin Coy BMI: 59 MRN: [id number] Neck Size: 15.00  CLINICAL INFORMATION The patient is referred for a CPAP titration to treat sleep apnea.  SLEEP STUDY TECHNIQUE As per the AASM Manual for the Scoring of Sleep and Associated Events v2.3 (April 2016) with a hypopnea requiring 4% desaturations.  The channels recorded and monitored were frontal, central and occipital EEG, electrooculogram (EOG), submentalis EMG (chin), nasal and oral airflow, thoracic and abdominal wall motion, anterior tibialis EMG, snore microphone, electrocardiogram, and pulse oximetry. Continuous positive airway pressure (CPAP) was initiated at the beginning of the study and titrated to treat sleep-disordered breathing.  MEDICATIONS Medications self-administered by patient taken the night of the study : ATORVASTATIN, CARVEDILOL, ELIQUIS, GABAPENTIN, ROPINIROLE, TRAMADOL  TECHNICIAN COMMENTS Comments added by technician: PATIENT WAS ORDERED AS A CPAP TITRATION. Patient sleep in a recliner chair. Comments added by scorer: N/A  RESPIRATORY PARAMETERS Optimal PAP Pressure (cm): 14 AHI at Optimal Pressure (/hr): 0.0 Overall Minimal O2 (%): 94.0 Supine % at Optimal Pressure (%): 0 Minimal O2 at Optimal Pressure (%): 96.0   SLEEP ARCHITECTURE The study was initiated at 10:21:05 PM and ended at 4:36:02 AM.  Sleep onset time was 7.5 minutes and the sleep efficiency was 87.2%%. The total sleep time was 327 minutes.  The patient spent 8.7%% of the night in stage N1 sleep, 75.5%% in stage N2 sleep, 0.0%% in stage N3 and 15.75% in REM.Stage REM latency was 115.0 minutes  Wake after sleep onset was 40.5. Alpha intrusion was absent. Supine sleep was 0.00%.  CARDIAC DATA The 2 lead  EKG demonstrated atrial fibrillation. The mean heart rate was 54.3 beats per minute. Other EKG findings include: PVCs. LEG MOVEMENT DATA The total Periodic Limb Movements of Sleep (PLMS) were 0. The PLMS index was 0.0. A PLMS index of <15 is considered normal in adults.  IMPRESSIONS - The optimal PAP pressure was 14 cm of water. - Central sleep apnea was not noted during this titration (CAI = 0.2/h). - Significant oxygen desaturations were not observed during this titration (min O2 = 94.0%). - The patient snored with soft snoring volume during this titration study. - 2-lead EKG demonstrated: PVCs and atrial fibrillation - Clinically significant periodic limb movements were not noted during this study. Arousals associated with PLMs were rare.  DIAGNOSIS - Obstructive Sleep Apnea (327.23 [G47.33 ICD-10]) - Atrial Fibrillation  RECOMMENDATIONS - Trial of CPAP therapy on 14 cm H2O with a Large size Philips Respironics Full Face Mask Dreamwear mask and heated humidification. - Avoid alcohol, sedatives and other CNS depressants that may worsen sleep apnea and disrupt normal sleep architecture. - Sleep hygiene should be reviewed to assess factors that may improve sleep quality. - Weight management and regular exercise should be initiated or continued. - Return to Sleep Center for re-evaluation after 10 weeks of therapy  [Electronically signed] 03/26/2018 08:42 PM  Fransico Him MD, ABSM Diplomate, American Board of Sleep Medicine

## 2018-03-26 NOTE — Progress Notes (Signed)
Advanced Heart Failure Clinic Note   Referring Physician: Dr. Angelena Form Primary Care: Dr. Juanito Doom Primary Cardiologist: Dr. Angelena Form HF: New  HPI:  [name] is a 77 y.o. female [name] is a 77 y.o. female with history of CAD status post CABG 2009, ischemic cardiomyopathy with chronic systolic CHF, persistent atrial fibrillation with stroke in 03/2016 when INR was subtherapeutic, stage III CKD, morbid obesity, pulmonary hypertension, diabetes, GI bleed 03/2017, and severe sleep apnea.   Seen in [facility] clinics 10/30 and 11/13, both times with volume overload and marked peripheral edema with skin breakdown. Torsemide increased at each visit.   She presents today for regular follow up. Last visit losartan added. Overall feeling great. Had CPAP titration last night. Weight trending downward at home. Trying to stay active. Denies SOB, lightheadedness, or dizziness. No CP or orthopnea. Wearing CPAP nightly No problems since starting losartan. Taking all medications as directed. Watching salt and fluid intake.   Labs 10/14/17 K 3.9, Creatinine 1.65  Echo 09/30/2017 LVEF 35-40%, severe RV dilation, severe RAE, Severe TR, PA peak pressure.   Review of systems complete and found to be negative unless listed in HPI.    Past Medical History 1. Chronic systolic CHF due to ICM ->  R>L CHF - Echo 09/30/2017 LVEF 35-40%, severe RV dilation, severe RAE, Severe TR, PA peak pressure.  2. CAD sp CABG 2009 - No chest pain. No angiography since with lack of symptoms.  3. HTN 4. Persistent Afib - She has been rate controlled since 2013. Unlikely to cardiovert, and symptoms stable.  5. CKD stage 3 6. Morbid obesity 7. OSA on CPAP 8. Chronic venous stasis with RLE wound/Bullae 9. RCA CVA 2018 - No major deficits. On coumadin long-term for Afib.   Past Medical History:  Diagnosis Date  . ACC/AHA stage C congestive heart failure due to ischemic cardiomyopathy ([place]) 01/31/2009   Qualifier:  Diagnosis of  By: Olevia Perches, MD, Glenetta Hew   . ANXIETY 01/31/2009   Qualifier: Diagnosis of  By: Lovette Cliche, CNA, Christy    . Arthritis    "qwhere" (03/30/2016)  . Benign essential HTN   . CAD (coronary artery disease) 05/2008   a. s/p CABG in 2009.  Marland Kitchen Cerebrovascular accident (CVA) due to embolism of right middle cerebral artery ([place])   . Chronic anticoagulation   . Chronic kidney disease (CKD), stage III (moderate) (HCC)   . Chronic lower back pain   . Chronic pain syndrome   . Chronic systolic CHF (congestive heart failure) ([place])   . DM type 2 with diabetic peripheral neuropathy ([place])   . Facial weakness, post-stroke   . Gastrointestinal hemorrhage 03/26/2017  . Hemiparesis ([place])   . History of CVA with residual deficit 03/26/2017  . Hyperlipidemia   . Hypertension   . Ischemic cardiomyopathy   . Longstanding persistent atrial fibrillation ([place])    a. Postoperative in 2009;  S/P transesophageal echocardiography-guided cardioversion, previously on Amiodarone and Coumadin. b. recurrent in April 2013 -> pt elected rate control strategy, initially Coumadin -> had stroke with subtherapeutic INR in 03/2016 and transitioned to Eliquis.  . Morbid obesity ([place]) 03/26/2017  . OSA on CPAP    severe OSA with AHI 34/hr  . Persistent atrial fibrillation ([place])   . Thrombocytopenia ([place])     Current Outpatient Medications  Medication Sig Dispense Refill  . apixaban (ELIQUIS) 5 MG TABS tablet TAKE ONE (1) TABLET BY MOUTH TWO (2) TIMES DAILY 60 tablet 11  . atorvastatin (  LIPITOR) 40 MG tablet Take 1 tablet (40 mg total) by mouth daily at 6 PM. 90 tablet 1  . carvedilol (COREG) 6.25 MG tablet Take 1 tablet (6.25 mg total) by mouth 2 (two) times daily with a meal. 60 tablet 6  . cholecalciferol (VITAMIN D) 1000 UNITS tablet Take 1,000 Units by mouth 2 (two) times daily.    Marland Kitchen docusate sodium (COLACE) 100 MG capsule Take 200 mg by mouth at bedtime.     . gabapentin (NEURONTIN) 400 MG capsule Take 400  mg by mouth 2 (two) times daily.    . insulin glargine (LANTUS) 100 UNIT/ML injection Inject 0.23 mLs (23 Units total) into the skin at bedtime. 10 mL 2  . LANTUS SOLOSTAR 100 UNIT/ML Solostar Pen INJECT 23 UNITS INTO SKIN AT BEDTIME 15 mL 0  . losartan (COZAAR) 25 MG tablet Take 0.5 tablets (12.5 mg total) by mouth daily. 15 tablet 11  . metolazone (ZAROXOLYN) 2.5 MG tablet Take 1 tablet (2.5 mg total) by mouth daily as needed. Take extra 20 meq potassium tab. Must call CHF clinic before taking ([phone]) 30 tablet 3  . potassium chloride SA (K-DUR,KLOR-CON) 20 MEQ tablet Take 2 tablets (40 mEq total) by mouth daily. 60 tablet 11  . rOPINIRole (REQUIP) 0.5 MG tablet TAKE 2 TABLETS BY MOUTH AT  BEDTIME 180 tablet 0  . spironolactone (ALDACTONE) 25 MG tablet Take 0.5 tablets (12.5 mg total) by mouth daily. 45 tablet 3  . torsemide (DEMADEX) 20 MG tablet Take 3 tablets (60 mg total) by mouth 2 (two) times daily. 180 tablet 3  . traMADol (ULTRAM) 50 MG tablet TAKE 2 TABLETS BY MOUTH EVERY MORNING AND TAKE 1 TABLET AT NIGHT 90 tablet 0  . vitamin B-12 (CYANOCOBALAMIN) 1000 MCG tablet Take 1 tablet (1,000 mcg total) by mouth daily. 90 tablet 0  . nitroGLYCERIN (NITROSTAT) 0.4 MG SL tablet Place 0.4 mg under the tongue every 5 (five) minutes as needed for chest pain (up to 3 doses).      No current facility-administered medications for this encounter.    Allergies  Allergen Reactions  . Benazepril Other (See Comments)    Unknown reaction at age 49-65 - possibly dizziness  . Angiotensin Receptor Blockers Other (See Comments)    Hypotension reaction  . Fe-Succ-C-Thre-B12-Des Stomach Other (See Comments)    Unknown reaction  . Sulfamethoxazole-Trimethoprim Other (See Comments)    Unknown reaction   Social History   Socioeconomic History  . Marital status: Widowed    Spouse name: Not on file  . Number of children: Not on file  . Years of education: Not on file  . Highest education level:  Not on file  Occupational History  . Not on file  Social Needs  . Financial resource strain: Not on file  . Food insecurity:    Worry: Not on file    Inability: Not on file  . Transportation needs:    Medical: Not on file    Non-medical: Not on file  Tobacco Use  . Smoking status: Never Smoker  . Smokeless tobacco: Never Used  Substance and Sexual Activity  . Alcohol use: No  . Drug use: No  . Sexual activity: Not Currently  Lifestyle  . Physical activity:    Days per week: Not on file    Minutes per session: Not on file  . Stress: Not on file  Relationships  . Social connections:    Talks on phone: Not on file  Gets together: Not on file    Attends religious service: Not on file    Active member of club or organization: Not on file    Attends meetings of clubs or organizations: Not on file    Relationship status: Not on file  . Intimate partner violence:    Fear of current or ex partner: Not on file    Emotionally abused: Not on file    Physically abused: Not on file    Forced sexual activity: Not on file  Other Topics Concern  . Not on file  Social History Narrative   Widowed   Lives alone in [place]   2 children   Not routinely exercising   Family History  Problem Relation Age of Onset  . Clotting disorder Mother        Cerebral hemorrhage  . Emphysema Father        COD  . Lung cancer Brother   . Heart disease Neg Hx    Vitals:   03/26/18 0829  BP: 114/60  Pulse: 73  SpO2: 100%  Weight: 267 lb 6.4 oz (121.3 kg)     Wt Readings from Last 3 Encounters:  03/26/18 267 lb 6.4 oz (121.3 kg)  03/25/18 264 lb (119.7 kg)  02/26/18 270 lb (122.5 kg)    PHYSICAL EXAM: General: Well appearing. No resp difficulty. HEENT: Normal Neck: Supple. JVP 5-6. Carotids 2+ bilat; no bruits. No thyromegaly or nodule noted. Cor: PMI nondisplaced. RRR, No M/G/R noted Lungs: CTAB, normal effort. Abdomen: Soft, non-tender, non-distended, no HSM. No bruits or masses.  +BS  Extremities: No cyanosis, clubbing, or rash. R and LLE no edema.  Neuro: Alert & orientedx3, cranial nerves grossly intact. moves all 4 extremities w/o difficulty. Affect pleasant   ASSESSMENT & PLAN:  1. Chronic systolic CHF due to ICM ->  R>L CHF - Echo 09/30/2017 LVEF 35-40%, severe RV dilation, severe RAE, Severe TR, PA peak pressure.  - NYHA III symptoms chronically - Volume status stable on exam.   - Continue torsemide 60 mg BID. BMET today.  - Continue coreg 6.25 mg BID - Continue spiro 12.5 mg daily.  - Continue losartan 12.5 qhs.  - Reinforced fluid restriction to < 2 L daily, sodium restriction to less than 2000 mg daily, and the importance of daily weights.    2. CAD sp CABG - No s/s of ischemia.    - Continue atorvastatin 40 mg daily.  - No ASA with stable CAD on Eliquis.   3. HTN - Meds as above.   4. Persistent Afib - Rate controlled.  - No bleeding on Eliquis.   5. CKD stage 3 - BMET today.   6. Morbid obesity - Body mass index is 39.49 kg/m.  - Encouraged importance of weight loss.   7. Chronic venous stasis with RLE wound/Bullae - Stable with euvolemia.   8. RCA CVA 2018 - No major deficits. On coumadin long-term for Afib.  - No change to current plan.    9. OSA on CPAP - Encouraged nightly use.   Labs today. RTC 2 month with Echo.   Shirley Friar, PA-C 03/26/18   Greater than 50% of the 25 minute visit was spent in counseling/coordination of care regarding disease state education, salt/fluid restriction, sliding scale diuretics, and medication compliance.

## 2018-03-26 NOTE — Patient Instructions (Signed)
Routine lab work today. Will notify you of abnormal results, otherwise no news is good news!  Follow up as scheduled.  HAPPY BIRTHDAY!!  Take all medication as prescribed the day of your appointment. Bring all medications with you to your appointment.  Do the following things EVERYDAY: 1) Weigh yourself in the morning before breakfast. Write it down and keep it in a log. 2) Take your medicines as prescribed 3) Eat low salt foods-Limit salt (sodium) to 2000 mg per day.  4) Stay as active as you can everyday 5) Limit all fluids for the day to less than 2 liters

## 2018-03-28 ENCOUNTER — Telehealth (HOSPITAL_COMMUNITY): Payer: Self-pay

## 2018-03-28 ENCOUNTER — Telehealth: Payer: Self-pay | Admitting: *Deleted

## 2018-03-28 DIAGNOSIS — G4733 Obstructive sleep apnea (adult) (pediatric): Secondary | ICD-10-CM

## 2018-03-28 DIAGNOSIS — Z9989 Dependence on other enabling machines and devices: Principal | ICD-10-CM

## 2018-03-28 NOTE — Telephone Encounter (Signed)
Advanced Heart Failure Triage Encounter  Patient Name: [name]  Date of Call: 03/28/18  Problem:  Pt called b/c she gained 7 lbs. Okay to have metolazone per Oda Kilts, PA    Plan:  Pt aware and agreeable   Shirley Muscat, RN

## 2018-03-28 NOTE — Telephone Encounter (Signed)
-----   Message from Sueanne Margarita, MD sent at 03/26/2018  8:46 PM EDT ----- Please let patient know that they had a successful PAP titration and let DME know that orders are in EPIC.  Please set up 10 week OV with me.

## 2018-03-28 NOTE — Telephone Encounter (Signed)
Upon patient request DME selection is [facility]. Patient understands she will be contacted by [facility] to set up her cpap. Patient understands to call if [facility] does not contact her with new setup in a timely manner. Patient understands they will be called once confirmation has been received from [facility] that they have received their new machine to schedule 10 week follow up appointment.  [facility] notified of new cpap order  Please add to airview Patient was grateful for the call and thanked me. 

## 2018-03-29 ENCOUNTER — Telehealth: Payer: Self-pay | Admitting: Physician Assistant

## 2018-03-29 NOTE — Telephone Encounter (Signed)
Paged by answering service.  Patient has gained 7 pounds in past 2 days yesterday.  She took metolazone 2.5 mg around noon yesterday.  She went to bathroom alot however still gained 2 more pounds.  She already took her morning dose of torsemide.  I have advised to take 2.5 mg  of metolazone 30 minutes prior to her afternoon torsemide dose. Call us tomorrow if still elevated weight. Stable sob without orthopnea or pnd. Has Le edema.

## 2018-03-31 ENCOUNTER — Other Ambulatory Visit: Payer: Self-pay | Admitting: Family Medicine

## 2018-04-01 ENCOUNTER — Telehealth (HOSPITAL_COMMUNITY): Payer: Self-pay | Admitting: *Deleted

## 2018-04-01 NOTE — Telephone Encounter (Signed)
Advanced Heart Failure Triage Encounter  Patient Name: [name]  Date of Call: 04/01/18  Problem:  Received report from [facility] stating patient had a 9 lbs wt loss after taking metolazone over the weekend.  I called patient back directly and she stated she was 270 lbs on Sunday and Monday she was 261 lbs.  She feels good and no complaints  Plan:  After speaking with Glennie Hawk no further changes at this time.  If she continues to lose weight or starts having symptoms she can call us back.  No further questions.   Darron Doom, RN

## 2018-04-03 DIAGNOSIS — R8290 Unspecified abnormal findings in urine: Secondary | ICD-10-CM

## 2018-04-03 DIAGNOSIS — G4733 Obstructive sleep apnea (adult) (pediatric): Secondary | ICD-10-CM | POA: Diagnosis not present

## 2018-04-03 HISTORY — DX: Unspecified abnormal findings in urine: R82.90

## 2018-04-29 ENCOUNTER — Other Ambulatory Visit: Payer: Self-pay

## 2018-04-29 MED ORDER — GABAPENTIN 400 MG PO CAPS: 400.0000 mg | ORAL_CAPSULE | Freq: Two times a day (BID) | ORAL | 1 refills | Status: DC

## 2018-04-29 NOTE — Telephone Encounter (Signed)
Patient needs Gabapentin refill. [facility] has told her it was refused but I see no record of it. Has appt 05/05/18.  Danley Danker, RN [facility] RN)

## 2018-05-04 DIAGNOSIS — G4733 Obstructive sleep apnea (adult) (pediatric): Secondary | ICD-10-CM | POA: Diagnosis not present

## 2018-05-05 ENCOUNTER — Other Ambulatory Visit: Payer: Self-pay

## 2018-05-05 ENCOUNTER — Encounter: Payer: Self-pay | Admitting: Family Medicine

## 2018-05-05 ENCOUNTER — Ambulatory Visit (INDEPENDENT_AMBULATORY_CARE_PROVIDER_SITE_OTHER): Payer: Medicare Other | Admitting: Family Medicine

## 2018-05-05 ENCOUNTER — Telehealth (HOSPITAL_COMMUNITY): Payer: Self-pay | Admitting: *Deleted

## 2018-05-05 VITALS — BP 98/56 | HR 66 | Temp 97.9°F | Ht 69.0 in | Wt 273.6 lb

## 2018-05-05 DIAGNOSIS — R200 Anesthesia of skin: Secondary | ICD-10-CM | POA: Diagnosis not present

## 2018-05-05 DIAGNOSIS — D519 Vitamin B12 deficiency anemia, unspecified: Secondary | ICD-10-CM

## 2018-05-05 DIAGNOSIS — Z7901 Long term (current) use of anticoagulants: Secondary | ICD-10-CM

## 2018-05-05 DIAGNOSIS — E1142 Type 2 diabetes mellitus with diabetic polyneuropathy: Secondary | ICD-10-CM

## 2018-05-05 DIAGNOSIS — M79642 Pain in left hand: Secondary | ICD-10-CM | POA: Diagnosis not present

## 2018-05-05 DIAGNOSIS — M79641 Pain in right hand: Secondary | ICD-10-CM

## 2018-05-05 LAB — POCT GLYCOSYLATED HEMOGLOBIN (HGB A1C): HbA1c, POC (controlled diabetic range): 6.3 % (ref 0.0–7.0)

## 2018-05-05 MED ORDER — TRAMADOL HCL 50 MG PO TABS: ORAL_TABLET | ORAL | 0 refills | Status: DC

## 2018-05-05 NOTE — Telephone Encounter (Signed)
Advanced Heart Failure Triage Encounter  Patient Name: [name]  Date of Call: 05/05/18  Problem:  RN with [facility] called to report that patient had a slow gradual weight gain of 7 lbs over the past 20 days.  Today she complains of swelling in her abdomin.  She is asking if patient can take her PRN metolazone.   Plan:  Per patient's MAR patient may take metolazone 2.5 mg today and an extra 20 mEq of potassium.  RN aware and will instruct patient. No further questions.   Darron Doom, RN

## 2018-05-05 NOTE — Patient Instructions (Signed)
Thank you for coming in today, it was so nice to see you! Today we talked about:    Hand pain/numbness: This is likely from arthritis of your hands.  We are going to check an anemia panel and a blood count today because sometimes this can cause numbness you are anemic.  I have sent a refill of her tramadol to her pharmacy  Decrease your Lantus to 20 units every day, check your sugar every morning before breakfast.  If your sugars are above 200 or less than 60 please call us  Please follow up in 2-3 months for diabetes. You can schedule this appointment at the front desk before you leave or call the clinic.  Bring in all your medications or supplements to each appointment for review.   If we ordered any tests today, you will be notified via telephone of any abnormalities. If everything is normal you will get a letter in the mail.   If you have any questions or concerns, please do not hesitate to call the office at [phone]. You can also message me directly via MyChart.   Sincerely,  Smitty Cords, MD

## 2018-05-05 NOTE — Progress Notes (Signed)
Subjective:    Patient ID: [name] , female   DOB: 03/15/1941 , 77 y.o..   MRN: [id number]  HPI  [name] is a 77 yo F with PMH of CAD with CABG 2009, ischemic cardiomyopathy, persistent atrial fibrillation on eliquis, HFrEF 35%-40%, HLD, DM, CKD stage 3 here for   1. Chronic Diabetes  Disease Monitoring  Blood Sugar Ranges: Does not regularly check her glucose  Polyuria: no   Visual problems: no   Last hemoglobin A1C:  Lab Results  Component Value Date   HGBA1C 6.3 05/05/2018   Medication Compliance: yes, Lantus 23 units every night Medication Side Effects  Hypoglycemia: no    2.  Bilateral hand pain/numbness/aching: Patient notes that for the last month her hands have been getting numb when she is typing on the computer a lot or crocheting.  She notes that she is also had some pain associated with this.  She also endorses trouble opening jars.  She notes that when she is holding the newspaper up she can tell that her hands are little bit shaky as well.  She denies any significant weakness.  Review of Systems: Per HPI.   Past Medical History: Patient Active Problem List   Diagnosis Date Noted  . OSA on CPAP   . Longstanding persistent atrial fibrillation ([place])   . Ischemic cardiomyopathy   . Hypertension   . Hemiparesis ([place])   . Facial weakness, post-stroke   . Chronic systolic CHF (congestive heart failure) ([place])   . Chronic lower back pain   . Chronic kidney disease (CKD), stage III (moderate) (HCC)   . Arthritis   . Venous stasis 09/13/2017  . Open wound of right knee, leg, and ankle with complication 71/04/2693  . Bilateral leg edema 07/18/2017  . Anemia 05/07/2017  . Abdominal mass, RLQ (right lower quadrant)   . History of CVA with residual deficit 03/26/2017  . Pulmonary hypertension ([place]) 03/26/2017  . Morbid obesity ([place]) 03/26/2017  . Gastrointestinal hemorrhage 03/26/2017  . Rectal bleeding   . Persistent atrial fibrillation ([place])   .  Chronic pain syndrome   . Coronary artery disease involving coronary bypass graft of native heart without angina pectoris   . Benign essential HTN   . DM type 2 with diabetic peripheral neuropathy ([place])   . Thrombocytopenia ([place])   . Cerebrovascular accident (CVA) due to embolism of right middle cerebral artery ([place])   . CKD (chronic kidney disease) stage 3, GFR 30-59 ml/min (HCC) 03/30/2016  . Chronic anticoagulation 03/30/2016  . Hyperlipidemia 01/31/2009  . ACC/AHA stage C congestive heart failure due to ischemic cardiomyopathy ([place]) 01/31/2009  . Chronic systolic heart failure ([place]) 01/31/2009  . CAD (coronary artery disease) 05/12/2008    Medications: reviewed and updated  Social Hx:  reports that she has never smoked. She has never used smokeless tobacco.   Objective:   BP (!) 98/56   Pulse 66   Temp 97.9 F (36.6 C) (Oral)   Ht 5\' 9"  (1.753 m)   Wt 273 lb 9.6 oz (124.1 kg)   LMP  (LMP Unknown)   SpO2 92%   BMI 40.40 kg/m  Physical Exam  Gen: NAD, alert, cooperative with exam, well-appearing, increased BMI, appears pale Cardiac: Irregular rhythm, normal rate, faint heart sounds, systolic murmur present, 2+ pitting edema from knees down capillary refill brisk  Respiratory: Clear to auscultation bilaterally, no wheezes, non-labored breathing  Left hand exam: No swelling, tenderness, instability. Ligaments intact, FROM all  joints.  Neurovascularly intact Right hand exam: There is swelling and mild tenderness at the PIP joint of the middle finger, otherwise no joint swelling, tenderness, instability. Ligaments intact, FROM all joints.  Neurovascularly intact   Assessment & Plan:  DM type 2 with diabetic peripheral neuropathy ([place]) Diabetes under extremely good control, hemoglobin A1c 6.3.  Discussed with patient that for her age we can be more liberal with her A1 according to the [place], her goal A1c will be between 7-8.  Also unclear if she is having  any hypoglycemic spells as she has not been checking her glucose -Decrease Lantus to 20 units every night -Check fasting glucose every morning -Follow-up in 2 to 3 months -Earlier return precautions discussed  Hand pain and numbness: Endorsing intermittent pain/numbness of hands.  Likely secondary to osteoarthritis, no signs of carpal tunnel. Numbness could be manifestation of long-standing diabetes or B12 deficiency - CBC - Anemia panel  Anemia due to vitamin B12 deficiency, unspecified B12 deficiency type: History of B12 deficiency.  She is on B12 supplementation.  She does appear pale on exam today - CBC - Anemia panel   Smitty Cords, MD [place], PGY-3

## 2018-05-05 NOTE — Assessment & Plan Note (Signed)
Diabetes under extremely good control, hemoglobin A1c 6.3.  Discussed with patient that for her age we can be more liberal with her A1 according to the [place], her goal A1c will be between 7-8.  Also unclear if she is having any hypoglycemic spells as she has not been checking her glucose -Decrease Lantus to 20 units every night -Check fasting glucose every morning -Follow-up in 2 to 3 months -Earlier return precautions discussed

## 2018-05-06 ENCOUNTER — Other Ambulatory Visit: Payer: Self-pay | Admitting: Family Medicine

## 2018-05-06 ENCOUNTER — Other Ambulatory Visit (HOSPITAL_COMMUNITY): Payer: Self-pay

## 2018-05-06 ENCOUNTER — Telehealth (HOSPITAL_COMMUNITY): Payer: Self-pay

## 2018-05-06 ENCOUNTER — Other Ambulatory Visit: Payer: Self-pay | Admitting: *Deleted

## 2018-05-06 ENCOUNTER — Telehealth: Payer: Self-pay | Admitting: Family Medicine

## 2018-05-06 LAB — ANEMIA PANEL
FOLATE, HEMOLYSATE: 535.4 ng/mL
Ferritin: 33 ng/mL (ref 15–150)
Folate, RBC: 2020 ng/mL (ref >498)
Hematocrit: 26.5 % — ABNORMAL LOW (ref 34.0–46.6)
IRON: 29 ug/dL (ref 27–139)
Iron Saturation: 7 % — CL (ref 15–55)
Retic Ct Pct: 2 % (ref 0.6–2.6)
Total Iron Binding Capacity: 401 ug/dL (ref 250–450)
UIBC: 372 ug/dL — ABNORMAL HIGH (ref 118–369)
Vitamin B-12: 1946 pg/mL — ABNORMAL HIGH (ref 232–1245)

## 2018-05-06 LAB — CBC
Hemoglobin: 8.2 g/dL — ABNORMAL LOW (ref 11.1–15.9)
MCH: 25.8 pg — AB (ref 26.6–33.0)
MCHC: 30.9 g/dL — ABNORMAL LOW (ref 31.5–35.7)
MCV: 83 fL (ref 79–97)
PLATELETS: 113 10*3/uL — AB (ref 150–450)
RBC: 3.18 x10E6/uL — ABNORMAL LOW (ref 3.77–5.28)
RDW: 16.6 % — AB (ref 12.3–15.4)
WBC: 4.8 10*3/uL (ref 3.4–10.8)

## 2018-05-06 MED ORDER — TORSEMIDE 20 MG PO TABS: 60.0000 mg | ORAL_TABLET | Freq: Two times a day (BID) | ORAL | 3 refills | Status: DC

## 2018-05-06 MED ORDER — GLUCOSE BLOOD VI STRP: ORAL_STRIP | 3 refills | Status: DC

## 2018-05-06 MED ORDER — FERROUS SULFATE 325 (65 FE) MG PO TABS: 325.0000 mg | ORAL_TABLET | Freq: Two times a day (BID) | ORAL | 3 refills | Status: DC

## 2018-05-06 NOTE — Telephone Encounter (Addendum)
Hemoglobin low at 8.2 with low iron saturation. Called patient to discuss this. Prescription sent for ferrous sulfate 325 mg BID. Since she has hx of CHF and CAD, her transfusion threshold would be 8. She will need close follow up to ensure she does not reach this. Scheduled an appointment for her 7/3 with Dr. Lindell Noe. She was appreciative of the call.   Smitty Cords, MD [place], PGY-3

## 2018-05-06 NOTE — Progress Notes (Signed)
Opened in error. Fleeger, Jessica Dawn, CMA  

## 2018-05-06 NOTE — Telephone Encounter (Signed)
Called patient back. Discussed that I have never heard of a pharmacy being out of Torsemide. In any case she called her cardiology office and they sent a prescription to [facility].   Also called patient to discuss her low hemoglobin to 8.2 and low iron saturation. She denies any bleeding. Iron supplementation to pharmacy; ferrous sulfate 325 mg BID. Discussed that this medication can constipate her so she will need to continue taking Miralax.   She will need close follow up. On the phone she endorsed that she has been feeling more weak as well. Discussed she would need to be seen in the clinic so we could address that further. Scheduled appt for her on 7/3 with Dr. Lindell Noe. She was appreciative of the call.   Smitty Cords, MD [place], PGY-3

## 2018-05-06 NOTE — Telephone Encounter (Signed)
Patient calling CHF clinic triage line to report drug store unable to supply torsemide. Rx sent to another pharmacy.  Advised patient to call drug store in 1 hr to follow up (phone number provided to patient) and to call us back with any further issues or needs.  Renee Pain, RN

## 2018-05-06 NOTE — Telephone Encounter (Signed)
Pt called and said she spoke with her pharmacy about her Torsemide refill. They told her their store no longer had the medication. She only has 17 pills left and is worried about having it filled in time. The pharmacy said if she spoke with her doctor they may suggest a different medication or find a pharmacy that has it available. Pt would like Dr Juanito Doom to give her a call so she knows what would be her best option.

## 2018-05-08 ENCOUNTER — Telehealth: Payer: Self-pay | Admitting: Family Medicine

## 2018-05-08 NOTE — Telephone Encounter (Signed)
[name] was told to call Dr Juanito Doom if her blood sugar was over 200. This morning it was 309 before taking her medication. She has not taken the medicine yet. She would like Dr Juanito Doom to give her a call.

## 2018-05-08 NOTE — Telephone Encounter (Signed)
Returned patient's call. Patient notes that she had a glucose of 300 this morning and that made her worried because she didn't eat anything carbohydrate rich the night before (she had mostly vegetables). I believe her glucose went up because we had decreased her Lantus. Asked patient to go up to 21 units of Lantus tonight. If her glucose is >200 in the morning she will let us know. She was appreciative of the call  Smitty Cords, MD [place], PGY-3

## 2018-05-12 DIAGNOSIS — H43822 Vitreomacular adhesion, left eye: Secondary | ICD-10-CM | POA: Diagnosis not present

## 2018-05-12 DIAGNOSIS — H353221 Exudative age-related macular degeneration, left eye, with active choroidal neovascularization: Secondary | ICD-10-CM | POA: Diagnosis not present

## 2018-05-12 DIAGNOSIS — H43811 Vitreous degeneration, right eye: Secondary | ICD-10-CM | POA: Diagnosis not present

## 2018-05-12 DIAGNOSIS — H353112 Nonexudative age-related macular degeneration, right eye, intermediate dry stage: Secondary | ICD-10-CM | POA: Diagnosis not present

## 2018-05-12 DIAGNOSIS — H353122 Nonexudative age-related macular degeneration, left eye, intermediate dry stage: Secondary | ICD-10-CM | POA: Diagnosis not present

## 2018-05-12 LAB — HM DIABETES EYE EXAM

## 2018-05-14 ENCOUNTER — Telehealth: Payer: Self-pay | Admitting: Family Medicine

## 2018-05-14 ENCOUNTER — Ambulatory Visit (INDEPENDENT_AMBULATORY_CARE_PROVIDER_SITE_OTHER): Payer: Medicare Other | Admitting: Family Medicine

## 2018-05-14 ENCOUNTER — Other Ambulatory Visit: Payer: Self-pay

## 2018-05-14 ENCOUNTER — Encounter: Payer: Self-pay | Admitting: Family Medicine

## 2018-05-14 VITALS — BP 96/60 | HR 70 | Temp 98.2°F | Wt 267.0 lb

## 2018-05-14 DIAGNOSIS — D649 Anemia, unspecified: Secondary | ICD-10-CM

## 2018-05-14 DIAGNOSIS — I5022 Chronic systolic (congestive) heart failure: Secondary | ICD-10-CM

## 2018-05-14 DIAGNOSIS — D509 Iron deficiency anemia, unspecified: Secondary | ICD-10-CM | POA: Diagnosis not present

## 2018-05-14 DIAGNOSIS — N183 Chronic kidney disease, stage 3 (moderate): Secondary | ICD-10-CM | POA: Diagnosis not present

## 2018-05-14 LAB — POCT HEMOGLOBIN: HEMOGLOBIN: 8.5 g/dL — AB (ref 12.2–16.2)

## 2018-05-14 NOTE — Progress Notes (Signed)
   CC: anemia  HPI  Anemia - taking iron BID, no constipation. Has not had IV iron before. Sees nephrology next week. No hematemesis or melena. No new SOB or weakness since last visit.   CHF - feels ok, denies lightheaded or weakness. BP is high 90s at other visits too. No increasing SOB with walking. No rapid weight gain, sees Dr. Marigene Ehlers later this month.   ROS: Denies CP, SOB, abdominal pain, dysuria, changes in BMs.   CC, SH/smoking status, and VS noted  Objective: BP 96/60   Pulse 70   Temp 98.2 F (36.8 C) (Oral)   Wt 267 lb (121.1 kg)   LMP  (LMP Unknown)   SpO2 98%   BMI 39.43 kg/m  Gen: NAD, alert, cooperative, and pleasant. HEENT: NCAT, EOMI, PERRL, no JVD CV: regular rate, no murmur Resp: CTAB, no wheezes, non-labored Abd: SNTND, BS present, no guarding or organomegaly Ext: compression stockings in place, 1+ edema to mid shin Neuro: Alert and oriented, Speech clear, No gross deficits  Assessment and plan:  Iron deficiency anemia New finding, unclear etiology. Hgb stable today. Encouraged follow up with nephrology to consider IV iron. Transfusion threshold is 8 given CHF. Patient needs repeat colonoscopy, discussed this with her via phone after visit. Prep with colonoscopy while hospitalized last year with GI bleed was suboptimal, and report states blood visualized throughout the colon.  This makes it possible that a polyp may have gone unrecognized and could be causing bleeding leading to iron deficiency anemia.  Additional possible causes include slowly bleeding diverticulosis in the setting of anticoagulation.  Patient will call [facility] to make a follow-up appointment with them to discuss possible colonoscopy.  Chronic systolic CHF (congestive heart failure) (HCC) Relative hypotension today, however patient ambulates with a walker without any signs of syncope or loss of consciousness.  She states that she does not feel lightheaded.  Continue current medication  regimen.  Has follow-up with heart failure next month.   Orders Placed This Encounter  Procedures  . Hemoglobin    No orders of the defined types were placed in this encounter.    Ralene Ok, MD, PGY3 05/19/2018 1:46 PM

## 2018-05-14 NOTE — Patient Instructions (Signed)
It was a pleasure to see you today! Thank you for choosing [facility] for your primary care. [name] was seen for blood count.   Our plans for today were:  You do not need a transfusion today. If at any point, you become more short of breath or feel your heart beating faster than normal, you need to call us or be seen because you blood count could have dropped.  Ask your kidney doctor if they think you may need IV iron or other medicines to help your blood count increase.   You should return to our clinic to see Dr. Shan Levans in 3 months for diabetes.   Best,  Dr. Lindell Noe

## 2018-05-14 NOTE — Progress Notes (Signed)
[place]

## 2018-05-14 NOTE — Telephone Encounter (Signed)
Called patient to clarify visit today for anemia - it looks like she was due for repeat colonoscopy 6 months after her episode of GI bleeding that required admission on 03/2017. I'm not sure from Dr. Thad Ranger note last week if they discussed this. Since her hemoglobin is stable, it can be done outpatient, but she likely needs a repeat colonoscopy if she didn't get one since last May. If she calls back, she can call her GI office to schedule this (ask her or her son to tell them it is not routine since she had a hx of GI bleeding) or we can help her with this is needed.

## 2018-05-19 DIAGNOSIS — D509 Iron deficiency anemia, unspecified: Secondary | ICD-10-CM | POA: Insufficient documentation

## 2018-05-19 NOTE — Assessment & Plan Note (Signed)
>>  ASSESSMENT AND PLAN FOR CHRONIC SYSTOLIC CHF (CONGESTIVE HEART FAILURE) (HCC) WRITTEN ON 05/19/2018  1:46 PM BY Garth Bigness, MD  Relative hypotension today, however patient ambulates with a walker without any signs of syncope or loss of consciousness.  She states that she does not feel lightheaded.  Continue current medication regimen.  Has follow-up with heart failure next month.

## 2018-05-19 NOTE — Telephone Encounter (Signed)
Patient returned call. Left number of [phone].  Danley Danker, RN [facility] RN)

## 2018-05-19 NOTE — Telephone Encounter (Signed)
Called patient back. We discussed that given new finding of iron deficiency anemia despite recent colonoscopy (mostly blood visualized with previous) she may need additional GI workup. She will call [facility].   She also wonders why she is on gabapentin, explained that she is on this for peripheral neuropathy. Thinks this might be making her sleepy and a little mentally foggy. She wants to try stopping the gabapentin to see if this changes her sleepiness, I let her know that this is safe and she can also come in and discuss this with Dr. Shan Levans.

## 2018-05-19 NOTE — Assessment & Plan Note (Addendum)
New finding, unclear etiology. Hgb stable today. Encouraged follow up with nephrology to consider IV iron. Transfusion threshold is 8 given CHF. Patient needs repeat colonoscopy, discussed this with her via phone after visit. Prep with colonoscopy while hospitalized last year with GI bleed was suboptimal, and report states blood visualized throughout the colon.  This makes it possible that a polyp may have gone unrecognized and could be causing bleeding leading to iron deficiency anemia.  Additional possible causes include slowly bleeding diverticulosis in the setting of anticoagulation.  Patient will call [facility] to make a follow-up appointment with them to discuss possible colonoscopy.

## 2018-05-19 NOTE — Telephone Encounter (Signed)
Called again, left VM again for son to call back to discuss colonoscopy.

## 2018-05-19 NOTE — Assessment & Plan Note (Signed)
Relative hypotension today, however patient ambulates with a walker without any signs of syncope or loss of consciousness.  She states that she does not feel lightheaded.  Continue current medication regimen.  Has follow-up with heart failure next month.

## 2018-05-21 DIAGNOSIS — E1122 Type 2 diabetes mellitus with diabetic chronic kidney disease: Secondary | ICD-10-CM | POA: Diagnosis not present

## 2018-05-21 DIAGNOSIS — N179 Acute kidney failure, unspecified: Secondary | ICD-10-CM | POA: Diagnosis not present

## 2018-05-21 DIAGNOSIS — I5022 Chronic systolic (congestive) heart failure: Secondary | ICD-10-CM | POA: Diagnosis not present

## 2018-05-21 DIAGNOSIS — N183 Chronic kidney disease, stage 3 (moderate): Secondary | ICD-10-CM | POA: Diagnosis not present

## 2018-05-22 ENCOUNTER — Telehealth (HOSPITAL_COMMUNITY): Payer: Self-pay | Admitting: *Deleted

## 2018-05-22 NOTE — Telephone Encounter (Addendum)
Pt returns call to let Dr.s Winfrey and Timberalke know the following after her visit with the nephrologist yesterday:  1. She will set her up with iron infusions, she is shooting for this week or next  2.  She has cut back on her gabapentin by 1 pill and has not noticed a difference.  Nephrologist suggested she try to d/c and see if that makes a difference.  She wanted to check with Dr. Lindell Noe first.  3. She has left a message for the heart failure clinic but they have not called her back yet.   Maryellen Dowdle, [name], CMA

## 2018-05-22 NOTE — Telephone Encounter (Signed)
Patient called and stated she had a visit with her kidney doctor yesterday and her weight is up to 270 lbs.  They suggested she call our office and see if she should take metolazone.   I called patient back and her weight is elevated and she has swelling in her legs/feet.  No sob or chest pain.  I advised her to go ahead and take metolazone once today only and call us back if her weight/swelling doesn't start to come down.  She already has follow up scheduled on 7/23.  No further questions.

## 2018-05-24 NOTE — Telephone Encounter (Signed)
It's ok to d/c gabapentin, although her neuropathy pain may increase. Please help her get an appt with Dr. Shan Levans coming up so that they can get all of her medications up to date and discuss her coordination of care.

## 2018-05-26 ENCOUNTER — Telehealth (HOSPITAL_COMMUNITY): Payer: Self-pay

## 2018-05-26 NOTE — Telephone Encounter (Signed)
Thank you :)

## 2018-05-26 NOTE — Telephone Encounter (Signed)
Contacted pt and gave her the below information and scheduled her an appointment with PCP on 07/01/18 @ 8:30am to update medication and coordinated care. Katharina Caper, Minoru Chap D, [place]

## 2018-05-26 NOTE — Telephone Encounter (Signed)
To you 

## 2018-05-26 NOTE — Telephone Encounter (Signed)
Vickie from [facility] called and stated that pt has lost 12.6 lbs in 4 days and pt has no symptoms. Vickie wanted you aware and she states she will fax over the documentation.

## 2018-05-27 ENCOUNTER — Encounter: Payer: Self-pay | Admitting: Cardiology

## 2018-05-27 ENCOUNTER — Encounter: Payer: Medicare Other | Admitting: Cardiology

## 2018-05-27 VITALS — BP 112/60 | HR 76 | Ht 69.0 in | Wt 257.8 lb

## 2018-05-27 NOTE — Addendum Note (Signed)
Addended by: Freada Bergeron on: 05/27/2018 01:52 PM   Modules accepted: Orders

## 2018-05-27 NOTE — Progress Notes (Deleted)
Cardiology Office Note:    Date:  05/27/2018   ID:  ENEDELIA [name], DOB 19-Feb-1941, MRN [id number]  PCP:  Kathrene Alu, MD  Cardiologist:  Fransico Him, MD    Referring MD: Carlyle Dolly, MD   No chief complaint on file.   History of Present Illness:    [name] is a 77 y.o. female with a hx of severe OSA with an AHI of 34/hr.This was ordered due to excessive daytime sleepiness and snoring. She subsequently underwent CPAP titration and is now on CPAP.  She had been on CPAP but at last office visit had a high AHI of 24.1/h on auto CPAP and was sent to the sleep lab for re-titration.  Unfortunately she could not be adequately titrated in the lab and was started on BiPAP on auto.  Unfortunately she was placed back on auto CPAP and not auto BiPAP.  She could not be adequately titrated on home auto BiPAP and went back to the sleep lab for repeat titration and acutally was titrated to 14cm H2O.      She is doing well with her CPAP device and thinks that she has gotten used to it.  She tolerates the mask and feels the pressure is adequate.  Since going on CPAP she feels rested in the am and has no significant daytime sleepiness.  She denies any significant mouth or nasal dryness or nasal congestion.  She does not think that he snores.     Past Medical History:  Diagnosis Date  . ACC/AHA stage C congestive heart failure due to ischemic cardiomyopathy ([place]) 01/31/2009   Qualifier: Diagnosis of  By: Olevia Perches, MD, Glenetta Hew   . ANXIETY 01/31/2009   Qualifier: Diagnosis of  By: Lovette Cliche, CNA, Christy    . Arthritis    "qwhere" (03/30/2016)  . Benign essential HTN   . CAD (coronary artery disease) 05/2008   a. s/p CABG in 2009.  Marland Kitchen Cerebrovascular accident (CVA) due to embolism of right middle cerebral artery ([place])   . Chronic anticoagulation   . Chronic kidney disease (CKD), stage III (moderate) (HCC)   . Chronic lower back pain   . Chronic pain syndrome   . Chronic  systolic CHF (congestive heart failure) ([place])   . DM type 2 with diabetic peripheral neuropathy ([place])   . Facial weakness, post-stroke   . Gastrointestinal hemorrhage 03/26/2017  . Hemiparesis ([place])   . History of CVA with residual deficit 03/26/2017  . Hyperlipidemia   . Hypertension   . Ischemic cardiomyopathy   . Longstanding persistent atrial fibrillation ([place])    a. Postoperative in 2009;  S/P transesophageal echocardiography-guided cardioversion, previously on Amiodarone and Coumadin. b. recurrent in April 2013 -> pt elected rate control strategy, initially Coumadin -> had stroke with subtherapeutic INR in 03/2016 and transitioned to Eliquis.  . Morbid obesity ([place]) 03/26/2017  . OSA on CPAP    severe OSA with AHI 34/hr  . Persistent atrial fibrillation ([place])   . Thrombocytopenia ([place])     Past Surgical History:  Procedure Laterality Date  . APPENDECTOMY  1946  . CATARACT EXTRACTION Right 2012  . COLONOSCOPY WITH PROPOFOL Left 03/27/2017   Procedure: COLONOSCOPY WITH PROPOFOL;  Surgeon: Ronnette Juniper, MD;  Location: [place];  Service: Gastroenterology;  Laterality: Left;  . CORONARY ANGIOPLASTY WITH STENT PLACEMENT  2009   "put 2 stents in"  . HERNIA REPAIR    . JOINT REPLACEMENT    . TEE WITH CARDIOVERSION  Postoperative in 2009;  S/P transesophageal echocardiography-guided cardioversion,  . TOTAL KNEE ARTHROPLASTY Right 1994  . VENTRAL HERNIA REPAIR  10/1999   With mesh    Current Medications: Current Meds  Medication Sig  . apixaban (ELIQUIS) 5 MG TABS tablet TAKE ONE (1) TABLET BY MOUTH TWO (2) TIMES DAILY  . atorvastatin (LIPITOR) 40 MG tablet Take 1 tablet (40 mg total) by mouth daily at 6 PM.  . carvedilol (COREG) 6.25 MG tablet Take 1 tablet (6.25 mg total) by mouth 2 (two) times daily with a meal.  . cholecalciferol (VITAMIN D) 1000 UNITS tablet Take 1,000 Units by mouth 2 (two) times daily.  Marland Kitchen docusate sodium (COLACE) 100 MG capsule Take 200 mg by mouth at  bedtime.   . ferrous sulfate 325 (65 FE) MG tablet Take 1 tablet (325 mg total) by mouth 2 (two) times daily with a meal.  . glucose blood (ONE TOUCH ULTRA TEST) test strip Use three times a day  . insulin glargine (LANTUS) 100 UNIT/ML injection Inject 0.23 mLs (23 Units total) into the skin at bedtime.  Marland Kitchen losartan (COZAAR) 25 MG tablet Take 0.5 tablets (12.5 mg total) by mouth daily.  . metolazone (ZAROXOLYN) 2.5 MG tablet Take 1 tablet (2.5 mg total) by mouth daily as needed. Take extra 20 meq potassium tab. Must call CHF clinic before taking ([phone])  . nitroGLYCERIN (NITROSTAT) 0.4 MG SL tablet Place 0.4 mg under the tongue every 5 (five) minutes as needed for chest pain (up to 3 doses).   . potassium chloride SA (K-DUR,KLOR-CON) 20 MEQ tablet Take 2 tablets (40 mEq total) by mouth daily.  Marland Kitchen rOPINIRole (REQUIP) 0.5 MG tablet TAKE 2 TABLETS BY MOUTH AT  BEDTIME  . spironolactone (ALDACTONE) 25 MG tablet Take 0.5 tablets (12.5 mg total) by mouth daily.  Marland Kitchen torsemide (DEMADEX) 20 MG tablet Take 3 tablets (60 mg total) by mouth 2 (two) times daily.  . traMADol (ULTRAM) 50 MG tablet TAKE 2 TABLETS BY MOUTH EVERY MORNING AND TAKE 1 TABLET BY MOUTH AT NIGHT  . vitamin B-12 (CYANOCOBALAMIN) 1000 MCG tablet Take 1 tablet (1,000 mcg total) by mouth daily.     Allergies:   Benazepril; Angiotensin receptor blockers; Fe-succ-c-thre-b12-des stomach; and Sulfamethoxazole-trimethoprim   Social History   Socioeconomic History  . Marital status: Widowed    Spouse name: Not on file  . Number of children: Not on file  . Years of education: Not on file  . Highest education level: Not on file  Occupational History  . Not on file  Social Needs  . Financial resource strain: Not on file  . Food insecurity:    Worry: Not on file    Inability: Not on file  . Transportation needs:    Medical: Not on file    Non-medical: Not on file  Tobacco Use  . Smoking status: Never Smoker  . Smokeless tobacco:  Never Used  Substance and Sexual Activity  . Alcohol use: No  . Drug use: No  . Sexual activity: Not Currently  Lifestyle  . Physical activity:    Days per week: Not on file    Minutes per session: Not on file  . Stress: Not on file  Relationships  . Social connections:    Talks on phone: Not on file    Gets together: Not on file    Attends religious service: Not on file    Active member of club or organization: Not on file    Attends meetings  of clubs or organizations: Not on file    Relationship status: Not on file  Other Topics Concern  . Not on file  Social History Narrative   Widowed   Lives alone in [place]   2 children   Not routinely exercising     Family History: The patient's family history includes Clotting disorder in her mother; Emphysema in her father; Lung cancer in her brother. There is no history of Heart disease.  ROS:   Please see the history of present illness.    ROS  All other systems reviewed and negative.   EKGs/Labs/Other Studies Reviewed:    The following studies were reviewed today: PAP download  EKG:  EKG is not ordered today.   Recent Labs: 03/26/2018: BUN 63; Creatinine, Ser 1.71; Potassium 4.1; Sodium 139 05/05/2018: Platelets 113 05/14/2018: Hemoglobin 8.5   Recent Lipid Panel    Component Value Date/Time   CHOL 102 03/30/2016 0740   TRIG 50 03/30/2016 0740   HDL 39 (L) 03/30/2016 0740   CHOLHDL 2.6 03/30/2016 0740   VLDL 10 03/30/2016 0740   LDLCALC 53 03/30/2016 0740    Physical Exam:    VS:  BP 112/60 (BP Location: Left Arm, Patient Position: Sitting, Cuff Size: Large)   Pulse 76   Ht 5\' 9"  (1.753 m)   Wt 257 lb 12.8 oz (116.9 kg)   LMP  (LMP Unknown)   SpO2 98%   BMI 38.07 kg/m     Wt Readings from Last 3 Encounters:  05/27/18 257 lb 12.8 oz (116.9 kg)  05/14/18 267 lb (121.1 kg)  05/05/18 273 lb 9.6 oz (124.1 kg)     GEN:  Well nourished, well developed in no acute distress HEENT: Normal NECK: No JVD; No  carotid bruits LYMPHATICS: No lymphadenopathy CARDIAC: RRR, no murmurs, rubs, gallops RESPIRATORY:  Clear to auscultation without rales, wheezing or rhonchi  ABDOMEN: Soft, non-tender, non-distended MUSCULOSKELETAL:  No edema; No deformity  SKIN: Warm and dry NEUROLOGIC:  Alert and oriented x 3 PSYCHIATRIC:  Normal affect   ASSESSMENT:    1. OSA on CPAP   2. Benign essential HTN   3. Morbid obesity ([place])    PLAN:    In order of problems listed above:  1.  OSA - the patient is tolerating PAP therapy well without any problems. The PAP download was reviewed today and showed an AHI of ***/hr on *** cm H2O with ***% compliance in using more than 4 hours nightly.  The patient has been using and benefiting from PAP use and will continue to benefit from therapy.   2.  HTN - BP is well controlled on exam today.  She will continue on carvedilol 6.25mg  BID, Losartan 12.5mg  daily and spironolactone 12.5mg  daily.    3.  Morbid obesity - I have encouraged her to get into a routine exercise program and cut back on carbs and portions.     Medication Adjustments/Labs and Tests Ordered: Current medicines are reviewed at length with the patient today.  Concerns regarding medicines are outlined above.  No orders of the defined types were placed in this encounter.  No orders of the defined types were placed in this encounter.   Signed, Fransico Him, MD  05/27/2018 8:18 AM    [place]

## 2018-05-28 NOTE — Progress Notes (Signed)
This encounter was created in error - please disregard.

## 2018-05-29 ENCOUNTER — Telehealth: Payer: Self-pay | Admitting: Cardiology

## 2018-05-29 NOTE — Telephone Encounter (Signed)
New message  CHF clinic called on behalf of the patient. Patient requesting call   1) What problem are you experiencing? Requesting assistance getting supplies   2) Who is your medical equipment company?   Please route to the sleep study assistant.

## 2018-05-29 NOTE — Telephone Encounter (Signed)
Called patient to assure her that her chin strap was ordered on 05/27/18 at her office visit. I will call Ivin Booty at [facility] to confirm she received the fax. Pt is aware and agreeable to treatment.

## 2018-06-02 ENCOUNTER — Telehealth: Payer: Self-pay | Admitting: *Deleted

## 2018-06-02 ENCOUNTER — Other Ambulatory Visit: Payer: Self-pay | Admitting: Family Medicine

## 2018-06-02 DIAGNOSIS — Z9989 Dependence on other enabling machines and devices: Principal | ICD-10-CM

## 2018-06-02 DIAGNOSIS — G4733 Obstructive sleep apnea (adult) (pediatric): Secondary | ICD-10-CM

## 2018-06-02 NOTE — Telephone Encounter (Signed)
-----   Message from Freada Bergeron, [place] sent at 06/02/2018  2:31 PM EDT ----- Regarding: Pre cert Bipap titration

## 2018-06-02 NOTE — Telephone Encounter (Signed)
  [name], CMA  Sueanne Margarita, MD        Hello Dr Kathrin Ruddy states the patient DID NOT FAIL CPAP therefore she cannot be switched to Bipap unless she has a Bipap titration because she is out of her compliance period.  Please advise.

## 2018-06-02 NOTE — Telephone Encounter (Signed)
-----   Message from Sueanne Margarita, MD sent at 06/02/2018 12:09 PM EDT ----- Patient has failed CPAP as she continues to have significant apneas despite CPAP autotitration.  Please set up for in lab BiPAP titration

## 2018-06-02 NOTE — Telephone Encounter (Signed)
Bipap titration ordered and sent to sleep pool.

## 2018-06-02 NOTE — Telephone Encounter (Signed)
Staff message sent to Gae Bon per [facility] web portal no PA is required. Ok to schedule BIPAP titration. Decision [id number].

## 2018-06-03 ENCOUNTER — Other Ambulatory Visit: Payer: Self-pay | Admitting: Family Medicine

## 2018-06-03 ENCOUNTER — Ambulatory Visit (HOSPITAL_BASED_OUTPATIENT_CLINIC_OR_DEPARTMENT_OTHER)
Admission: RE | Admit: 2018-06-03 | Discharge: 2018-06-03 | Disposition: A | Discharge status: Home / Self Care | Payer: Medicare Other | Source: Ambulatory Visit | Attending: Cardiology | Admitting: Cardiology

## 2018-06-03 ENCOUNTER — Ambulatory Visit (HOSPITAL_COMMUNITY)
Admission: RE | Admit: 2018-06-03 | Discharge: 2018-06-03 | Disposition: A | Discharge status: Home / Self Care | Payer: Medicare Other | Source: Ambulatory Visit | Attending: Cardiology | Admitting: Cardiology

## 2018-06-03 VITALS — BP 127/69 | HR 77 | Wt 248.2 lb

## 2018-06-03 DIAGNOSIS — Z801 Family history of malignant neoplasm of trachea, bronchus and lung: Secondary | ICD-10-CM | POA: Diagnosis not present

## 2018-06-03 DIAGNOSIS — Z888 Allergy status to other drugs, medicaments and biological substances status: Secondary | ICD-10-CM | POA: Insufficient documentation

## 2018-06-03 DIAGNOSIS — I5022 Chronic systolic (congestive) heart failure: Secondary | ICD-10-CM

## 2018-06-03 DIAGNOSIS — Z794 Long term (current) use of insulin: Secondary | ICD-10-CM | POA: Diagnosis not present

## 2018-06-03 DIAGNOSIS — I2510 Atherosclerotic heart disease of native coronary artery without angina pectoris: Secondary | ICD-10-CM | POA: Diagnosis not present

## 2018-06-03 DIAGNOSIS — I878 Other specified disorders of veins: Secondary | ICD-10-CM | POA: Diagnosis not present

## 2018-06-03 DIAGNOSIS — Z7901 Long term (current) use of anticoagulants: Secondary | ICD-10-CM | POA: Insufficient documentation

## 2018-06-03 DIAGNOSIS — Z832 Family history of diseases of the blood and blood-forming organs and certain disorders involving the immune mechanism: Secondary | ICD-10-CM | POA: Diagnosis not present

## 2018-06-03 DIAGNOSIS — I255 Ischemic cardiomyopathy: Secondary | ICD-10-CM | POA: Diagnosis not present

## 2018-06-03 DIAGNOSIS — Z951 Presence of aortocoronary bypass graft: Secondary | ICD-10-CM | POA: Insufficient documentation

## 2018-06-03 DIAGNOSIS — Z825 Family history of asthma and other chronic lower respiratory diseases: Secondary | ICD-10-CM | POA: Diagnosis not present

## 2018-06-03 DIAGNOSIS — Z79899 Other long term (current) drug therapy: Secondary | ICD-10-CM | POA: Insufficient documentation

## 2018-06-03 DIAGNOSIS — N183 Chronic kidney disease, stage 3 unspecified: Secondary | ICD-10-CM

## 2018-06-03 DIAGNOSIS — I482 Chronic atrial fibrillation: Secondary | ICD-10-CM | POA: Diagnosis not present

## 2018-06-03 DIAGNOSIS — Z882 Allergy status to sulfonamides status: Secondary | ICD-10-CM | POA: Insufficient documentation

## 2018-06-03 DIAGNOSIS — E1122 Type 2 diabetes mellitus with diabetic chronic kidney disease: Secondary | ICD-10-CM | POA: Diagnosis not present

## 2018-06-03 DIAGNOSIS — Z8673 Personal history of transient ischemic attack (TIA), and cerebral infarction without residual deficits: Secondary | ICD-10-CM | POA: Diagnosis not present

## 2018-06-03 DIAGNOSIS — I130 Hypertensive heart and chronic kidney disease with heart failure and stage 1 through stage 4 chronic kidney disease, or unspecified chronic kidney disease: Secondary | ICD-10-CM | POA: Diagnosis not present

## 2018-06-03 DIAGNOSIS — I272 Pulmonary hypertension, unspecified: Secondary | ICD-10-CM | POA: Diagnosis not present

## 2018-06-03 DIAGNOSIS — I25810 Atherosclerosis of coronary artery bypass graft(s) without angina pectoris: Secondary | ICD-10-CM

## 2018-06-03 DIAGNOSIS — E785 Hyperlipidemia, unspecified: Secondary | ICD-10-CM | POA: Diagnosis not present

## 2018-06-03 DIAGNOSIS — Z6836 Body mass index (BMI) 36.0-36.9, adult: Secondary | ICD-10-CM | POA: Insufficient documentation

## 2018-06-03 DIAGNOSIS — G4733 Obstructive sleep apnea (adult) (pediatric): Secondary | ICD-10-CM | POA: Diagnosis not present

## 2018-06-03 LAB — CBC
HEMATOCRIT: 33.4 % — AB (ref 36.0–46.0)
Hemoglobin: 10.3 g/dL — ABNORMAL LOW (ref 12.0–15.0)
MCH: 27.6 pg (ref 26.0–34.0)
MCHC: 30.8 g/dL (ref 30.0–36.0)
MCV: 89.5 fL (ref 78.0–100.0)
PLATELETS: 125 10*3/uL — AB (ref 150–400)
RBC: 3.73 MIL/uL — ABNORMAL LOW (ref 3.87–5.11)
RDW: 21.1 % — AB (ref 11.5–15.5)
WBC: 5 10*3/uL (ref 4.0–10.5)

## 2018-06-03 LAB — BASIC METABOLIC PANEL
Anion gap: 9 (ref 5–15)
BUN: 51 mg/dL — AB (ref 8–23)
CALCIUM: 10.6 mg/dL — AB (ref 8.9–10.3)
CO2: 29 mmol/L (ref 22–32)
Chloride: 101 mmol/L (ref 98–111)
Creatinine, Ser: 1.65 mg/dL — ABNORMAL HIGH (ref 0.44–1.00)
GFR calc Af Amer: 33 mL/min — ABNORMAL LOW (ref >60)
GFR calc non Af Amer: 28 mL/min — ABNORMAL LOW (ref >60)
GLUCOSE: 99 mg/dL (ref 70–99)
Potassium: 3.8 mmol/L (ref 3.5–5.1)
Sodium: 139 mmol/L (ref 135–145)

## 2018-06-03 MED ORDER — TORSEMIDE 20 MG PO TABS: ORAL_TABLET | ORAL | 3 refills | Status: DC

## 2018-06-03 NOTE — Patient Instructions (Addendum)
Labs today (will call for abnormal results, otherwise no news is good news)  INCREASE Torsemide to 80 mg (4 Tablets) Twice Daily for 2 days ONLY.  Then take 80 mg (4 Tabs) in the AM and 60 mg (3 Tabs) in the PM.   Labs in 10 days (bmet)  Follow up with Dr. Aundra Dubin in 6 weeks.

## 2018-06-03 NOTE — Progress Notes (Signed)
  Echocardiogram 2D Echocardiogram has been performed.  [name] 06/03/2018, 9:50 AM

## 2018-06-03 NOTE — Progress Notes (Signed)
Advanced Heart Failure Clinic Note   Primary Care: Dr. Shan Levans Primary Cardiologist: Dr. Angelena Form HF Cardiology: Dr. Aundra Dubin  HPI:  [name] is a 77 y.o. female [name] is a 77 y.o. female with history of CAD status post CABG 2009, ischemic cardiomyopathy with chronic systolic CHF, persistent atrial fibrillation with stroke in 03/2016 when INR was subtherapeutic, stage III CKD, morbid obesity, pulmonary hypertension, diabetes, GI bleed 03/2017, and severe sleep apnea.   Seen in [facility] clinics 09/10/18 and 09/24/18, both times with volume overload and marked peripheral edema with skin breakdown. Torsemide increased at each visit.   She presents today for followup of CHF. Weight is down considerably.  Echo was done today and reviewed, EF 45-50% with mild RV dilation/mild RV systolic dysfunction and D-shaped interventricular septum.  She is still waiting for CPAP machine.  She is wearing compression stockings.  No chest pain.  Walks with walker, no dyspnea on flat ground.  She is not very active.  No lightheadedness or falls.  She has chronic orthopnea and has been sleeping in a walker for a long time.   Labs (12/18): K 3.9, Creatinine 1.65 Labs (5/19): K 4.1, creatinine 1.7 Labs (6/19): hgb 8.2  Review of systems complete and found to be negative unless listed in HPI.    Past Medical History 1. Chronic systolic CHF: Ischemic cardiomyopathy with prominent RV failure.   - Echo (11/18): LVEF 35-40%, severe RV dilation, severe RAE, severe TR.  - Echo (7/19): EF 45-50%, diffuse hypokinesis, mild RV dilation with mildly decreased RV systolic function, D-shaped interventricular septum suggestive of RV pressure/volume overload, mild MR, moderate TR, PASP 49 mmhg.  2. CAD: CABG 2009. No angiography since that time.   3. HTN 4. Atrial fibrillation: Chronic since 2013.  5. CKD stage 3 6. Morbid obesity 7. OSA 8. Chronic venous stasis with RLE wound/[place] 9. CVA 5/18.   Current  Outpatient Medications  Medication Sig Dispense Refill  . apixaban (ELIQUIS) 5 MG TABS tablet TAKE ONE (1) TABLET BY MOUTH TWO (2) TIMES DAILY 60 tablet 11  . atorvastatin (LIPITOR) 40 MG tablet TAKE 1 TABLET BY MOUTH  DAILY AT 6 PM. 90 tablet 1  . carvedilol (COREG) 6.25 MG tablet Take 1 tablet (6.25 mg total) by mouth 2 (two) times daily with a meal. 60 tablet 6  . cholecalciferol (VITAMIN D) 1000 UNITS tablet Take 1,000 Units by mouth 2 (two) times daily.    Marland Kitchen docusate sodium (COLACE) 100 MG capsule Take 200 mg by mouth at bedtime.     . ferrous sulfate 325 (65 FE) MG tablet Take 1 tablet (325 mg total) by mouth 2 (two) times daily with a meal. 60 tablet 3  . glucose blood (ONE TOUCH ULTRA TEST) test strip Use three times a day 100 each 3  . insulin glargine (LANTUS) 100 UNIT/ML injection Inject 0.23 mLs (23 Units total) into the skin at bedtime. 10 mL 2  . LANTUS SOLOSTAR 100 UNIT/ML Solostar Pen INJECT 23 UNITS INTO SKIN AT BEDTIME 15 mL 3  . losartan (COZAAR) 25 MG tablet Take 0.5 tablets (12.5 mg total) by mouth daily. 15 tablet 11  . metolazone (ZAROXOLYN) 2.5 MG tablet Take 1 tablet (2.5 mg total) by mouth daily as needed. Take extra 20 meq potassium tab. Must call CHF clinic before taking ([phone]) 30 tablet 3  . nitroGLYCERIN (NITROSTAT) 0.4 MG SL tablet Place 0.4 mg under the tongue every 5 (five) minutes as needed for chest pain (  up to 3 doses).     . potassium chloride SA (K-DUR,KLOR-CON) 20 MEQ tablet Take 2 tablets (40 mEq total) by mouth daily. 60 tablet 11  . rOPINIRole (REQUIP) 0.5 MG tablet TAKE 2 TABLETS BY MOUTH AT  BEDTIME 180 tablet 0  . spironolactone (ALDACTONE) 25 MG tablet Take 0.5 tablets (12.5 mg total) by mouth daily. 45 tablet 3  . torsemide (DEMADEX) 20 MG tablet Take 80 mg (4 Tablets) in the AM and 60 mg (3 Tablets) in the PM 215 tablet 3  . vitamin B-12 (CYANOCOBALAMIN) 1000 MCG tablet Take 1 tablet (1,000 mcg total) by mouth daily. 90 tablet 0  . traMADol  (ULTRAM) 50 MG tablet TAKE 2 TABLETS BY MOUTH EVERY MORNING AND TAKE 1 TABLET AT NIGHT 90 tablet 0   No current facility-administered medications for this encounter.    Allergies  Allergen Reactions  . Benazepril Other (See Comments)    Unknown reaction at age 34-65 - possibly dizziness  . Angiotensin Receptor Blockers Other (See Comments)    Hypotension reaction  . Fe-Succ-C-Thre-B12-Des Stomach Other (See Comments)    Unknown reaction  . Sulfamethoxazole-Trimethoprim Other (See Comments)    Unknown reaction   Social History   Socioeconomic History  . Marital status: Widowed    Spouse name: Not on file  . Number of children: Not on file  . Years of education: Not on file  . Highest education level: Not on file  Occupational History  . Not on file  Social Needs  . Financial resource strain: Not on file  . Food insecurity:    Worry: Not on file    Inability: Not on file  . Transportation needs:    Medical: Not on file    Non-medical: Not on file  Tobacco Use  . Smoking status: Never Smoker  . Smokeless tobacco: Never Used  Substance and Sexual Activity  . Alcohol use: No  . Drug use: No  . Sexual activity: Not Currently  Lifestyle  . Physical activity:    Days per week: Not on file    Minutes per session: Not on file  . Stress: Not on file  Relationships  . Social connections:    Talks on phone: Not on file    Gets together: Not on file    Attends religious service: Not on file    Active member of club or organization: Not on file    Attends meetings of clubs or organizations: Not on file    Relationship status: Not on file  . Intimate partner violence:    Fear of current or ex partner: Not on file    Emotionally abused: Not on file    Physically abused: Not on file    Forced sexual activity: Not on file  Other Topics Concern  . Not on file  Social History Narrative   Widowed   Lives alone in [place]   2 children   Not routinely exercising   Family  History  Problem Relation Age of Onset  . Clotting disorder Mother        Cerebral hemorrhage  . Emphysema Father        COD  . Lung cancer Brother   . Heart disease Neg Hx    Vitals:   06/03/18 0957  BP: 127/69  Pulse: 77  SpO2: 99%  Weight: 248 lb 3.2 oz (112.6 kg)     Wt Readings from Last 3 Encounters:  06/03/18 248 lb 3.2 oz (112.6 kg)  05/27/18 257 lb 12.8 oz (116.9 kg)  05/14/18 267 lb (121.1 kg)    PHYSICAL EXAM: General: NAD Neck: JVP 8-9 cm, no thyromegaly or thyroid nodule.  Lungs: Clear to auscultation bilaterally with normal respiratory effort. CV: Nondisplaced PMI.  Heart regular S1/S2, no S3/S4, 2/6 HSM LLSB.  1+ ankle edema bilaterally.  No carotid bruit.  Normal pedal pulses.  Abdomen: Soft, nontender, no hepatosplenomegaly, no distention.  Skin: Intact without lesions or rashes.  Neurologic: Alert and oriented x 3.  Psych: Normal affect. Extremities: No clubbing or cyanosis.  HEENT: Normal.   ASSESSMENT & PLAN:  1. Chronic systolic CHF with prominent RV failure: Ischemic cardiomyopathy.  Echo done today and reviewed, LV EF 45-50% (improved) with mildly dilated/mildly dysfunction RV.  D-shaped septum suggests RV pressure/volume overload.  Though she has lost weight, she is still mildly volume overloaded on exam.  NYHA class III symptoms.  - Increase torsemide to 80 mg bid x 2 days, then 80 qam/60 qpm.  BMET today and in 10 days.   - Continue coreg 6.25 mg BID - Continue spiro 12.5 mg daily.  - Continue losartan 12.5 qhs.  2. CAD sp CABG: No chest pain.  - Continue atorvastatin 40 mg daily.  - No ASA with stable CAD on Eliquis.  3. Atrial fibrillation: Chronic. Given long-term atrial fibrillation, she is unlikely to successfully cardiovert.  - Continue Eliquis. CBC today.  4. CKD stage 3 - BMET today.  5. H/o CVA: Continue Eliquis.   6. OSA: Still needs CPAP.  Will contact Dr. Radford Pax.  Followup in 6 wks.   Loralie Champagne 06/03/2018

## 2018-06-04 ENCOUNTER — Telehealth: Payer: Self-pay | Admitting: *Deleted

## 2018-06-04 DIAGNOSIS — I5042 Chronic combined systolic (congestive) and diastolic (congestive) heart failure: Secondary | ICD-10-CM | POA: Diagnosis not present

## 2018-06-04 DIAGNOSIS — I482 Chronic atrial fibrillation: Secondary | ICD-10-CM | POA: Diagnosis not present

## 2018-06-04 DIAGNOSIS — I25810 Atherosclerosis of coronary artery bypass graft(s) without angina pectoris: Secondary | ICD-10-CM | POA: Diagnosis not present

## 2018-06-04 NOTE — Telephone Encounter (Signed)
-----   Message from Darron Doom, RN sent at 06/03/2018 10:25 AM EDT ----- Regarding: CPAP Dr. Aundra Dubin saw patient in clinic today and they expressed frustration regarding their CPAP.  I think the wrong one was delivered to patient but she still has not been setup with the correct CPAP.  Dr. Aundra Dubin is asking for you to contact patient to get cpap setup.    Thank you,  Georgeanna Lea, RN.

## 2018-06-04 NOTE — Telephone Encounter (Signed)
Called patient to explain to her again that she is not eligible for a new machine until 5 more years in 2023. The machine she has was issued in 2018. Patient states her machine does not work and needs a chin strap. I reminded the patient that I ordered her a chin strap at her last office visit and [facility] has changed her machines pressure for patient comfort and they will contact her once her chin strap arrives at their office.Pt is aware and agreeable to treatment.

## 2018-06-05 ENCOUNTER — Other Ambulatory Visit (HOSPITAL_COMMUNITY): Payer: Self-pay | Admitting: *Deleted

## 2018-06-05 MED ORDER — TORSEMIDE 20 MG PO TABS: ORAL_TABLET | ORAL | 3 refills | Status: DC

## 2018-06-12 DIAGNOSIS — N179 Acute kidney failure, unspecified: Secondary | ICD-10-CM | POA: Diagnosis not present

## 2018-06-12 DIAGNOSIS — N183 Chronic kidney disease, stage 3 (moderate): Secondary | ICD-10-CM | POA: Diagnosis not present

## 2018-06-12 DIAGNOSIS — D509 Iron deficiency anemia, unspecified: Secondary | ICD-10-CM | POA: Diagnosis not present

## 2018-06-13 ENCOUNTER — Ambulatory Visit (HOSPITAL_COMMUNITY)
Admission: RE | Admit: 2018-06-13 | Discharge: 2018-06-13 | Disposition: A | Discharge status: Home / Self Care | Payer: Medicare Other | Source: Ambulatory Visit | Attending: Cardiology | Admitting: Cardiology

## 2018-06-13 DIAGNOSIS — I5022 Chronic systolic (congestive) heart failure: Secondary | ICD-10-CM | POA: Diagnosis not present

## 2018-06-13 LAB — BASIC METABOLIC PANEL
Anion gap: 9 (ref 5–15)
BUN: 52 mg/dL — ABNORMAL HIGH (ref 8–23)
CALCIUM: 10.3 mg/dL (ref 8.9–10.3)
CHLORIDE: 101 mmol/L (ref 98–111)
CO2: 29 mmol/L (ref 22–32)
CREATININE: 1.73 mg/dL — AB (ref 0.44–1.00)
GFR calc non Af Amer: 27 mL/min — ABNORMAL LOW (ref >60)
GFR, EST AFRICAN AMERICAN: 31 mL/min — AB (ref >60)
Glucose, Bld: 138 mg/dL — ABNORMAL HIGH (ref 70–99)
Potassium: 4.3 mmol/L (ref 3.5–5.1)
Sodium: 139 mmol/L (ref 135–145)

## 2018-06-16 ENCOUNTER — Other Ambulatory Visit: Payer: Self-pay | Admitting: Family Medicine

## 2018-06-16 ENCOUNTER — Telehealth: Payer: Self-pay | Admitting: *Deleted

## 2018-06-16 MED ORDER — INSULIN GLARGINE 100 UNIT/ML SOLOSTAR PEN: PEN_INJECTOR | SUBCUTANEOUS | 3 refills | Status: DC

## 2018-06-16 NOTE — Telephone Encounter (Signed)
Lantus solostar pens reordered.

## 2018-06-16 NOTE — Telephone Encounter (Signed)
Pt is requesting a refill on pen needles for her solostar.   She only has 4 left.  Ranyah Groeneveld, [name], CMA

## 2018-06-17 ENCOUNTER — Other Ambulatory Visit: Payer: Self-pay

## 2018-06-17 ENCOUNTER — Other Ambulatory Visit: Payer: Self-pay | Admitting: Family Medicine

## 2018-06-17 MED ORDER — PEN NEEDLES 32G X 4 MM MISC: 1.0000 | Freq: Every day | 1 refills | Status: DC

## 2018-06-18 DIAGNOSIS — D509 Iron deficiency anemia, unspecified: Secondary | ICD-10-CM | POA: Diagnosis not present

## 2018-06-19 DIAGNOSIS — G4733 Obstructive sleep apnea (adult) (pediatric): Secondary | ICD-10-CM | POA: Diagnosis not present

## 2018-06-19 NOTE — Telephone Encounter (Signed)
Patient did not need the Bipap titration only a new chin strap that has been ordered and sent to the patient per [facility].Marland Kitchen Pt is aware and agreeable to treatment.

## 2018-06-19 NOTE — Telephone Encounter (Signed)
  Lauralee Evener, CMA  Freada Bergeron, CMA        Per [facility] web portal no PA is required. Ok to schedule BIPAP titration.  Decision ID: [id number]     ----- Message -----  From: Freada Bergeron, CMA  Sent: 06/02/2018  2:31 PM  To: [facility]  Subject: Pre cert                     Bipap titration

## 2018-06-25 DIAGNOSIS — D509 Iron deficiency anemia, unspecified: Secondary | ICD-10-CM | POA: Diagnosis not present

## 2018-06-25 DIAGNOSIS — N183 Chronic kidney disease, stage 3 (moderate): Secondary | ICD-10-CM | POA: Diagnosis not present

## 2018-06-25 DIAGNOSIS — N179 Acute kidney failure, unspecified: Secondary | ICD-10-CM | POA: Diagnosis not present

## 2018-06-25 DIAGNOSIS — E1122 Type 2 diabetes mellitus with diabetic chronic kidney disease: Secondary | ICD-10-CM | POA: Diagnosis not present

## 2018-06-25 DIAGNOSIS — I5022 Chronic systolic (congestive) heart failure: Secondary | ICD-10-CM | POA: Diagnosis not present

## 2018-06-26 ENCOUNTER — Other Ambulatory Visit: Payer: Self-pay | Admitting: Gastroenterology

## 2018-06-26 DIAGNOSIS — D509 Iron deficiency anemia, unspecified: Secondary | ICD-10-CM | POA: Diagnosis not present

## 2018-06-26 DIAGNOSIS — D508 Other iron deficiency anemias: Secondary | ICD-10-CM

## 2018-07-01 ENCOUNTER — Ambulatory Visit (INDEPENDENT_AMBULATORY_CARE_PROVIDER_SITE_OTHER): Payer: Medicare Other | Admitting: Family Medicine

## 2018-07-01 ENCOUNTER — Other Ambulatory Visit: Payer: Self-pay

## 2018-07-01 VITALS — BP 118/72 | HR 69 | Temp 97.7°F | Wt 237.6 lb

## 2018-07-01 DIAGNOSIS — E1142 Type 2 diabetes mellitus with diabetic polyneuropathy: Secondary | ICD-10-CM

## 2018-07-01 DIAGNOSIS — G8929 Other chronic pain: Secondary | ICD-10-CM | POA: Diagnosis not present

## 2018-07-01 DIAGNOSIS — R600 Localized edema: Secondary | ICD-10-CM | POA: Diagnosis not present

## 2018-07-01 DIAGNOSIS — D509 Iron deficiency anemia, unspecified: Secondary | ICD-10-CM

## 2018-07-01 DIAGNOSIS — M545 Low back pain: Secondary | ICD-10-CM | POA: Diagnosis not present

## 2018-07-01 MED ORDER — TORSEMIDE 20 MG PO TABS: ORAL_TABLET | ORAL | 3 refills | Status: DC

## 2018-07-01 MED ORDER — TRAMADOL HCL 50 MG PO TABS: ORAL_TABLET | ORAL | 0 refills | Status: DC

## 2018-07-01 MED ORDER — FERROUS SULFATE 325 (65 FE) MG PO TABS: 325.0000 mg | ORAL_TABLET | Freq: Two times a day (BID) | ORAL | 3 refills | Status: DC

## 2018-07-01 NOTE — Assessment & Plan Note (Addendum)
Since patient's A1c is 6.3, she may benefit from transitioning off of Lantus and taking a GLP-1 inhibitor such as Victoza.  However, patient is currently in the donut hole, so we will need to see if this medication would be affordable for her.  Lantus is also expensive, but we will likely not switch to Victoza if Victoza cost more than Lantus.  Cardioprotective and weight loss benefits of Victoza were detailed with patient.  Patient is amenable to this change to see if affordable.  #2 obtain urine microalbumin today, but patient cannot urinate.  After the visit was complete, patient asked if I could prescribe her diabetic shoes.  Will work on this and contact patient when prescription is ready.

## 2018-07-01 NOTE — Assessment & Plan Note (Signed)
Likely in combination with anemia of chronic disease.  Will refill patient's ferrous sulfate.

## 2018-07-01 NOTE — Assessment & Plan Note (Signed)
Most closely with heart failure clinic.  Will let them manage her medications regarding her heart failure and CAD.  Will refill torsemide to reflect new increase in dose.

## 2018-07-01 NOTE — Assessment & Plan Note (Signed)
>>  ASSESSMENT AND PLAN FOR BILATERAL LEG EDEMA WRITTEN ON 07/01/2018  9:15 AM BY WINFREY, AMANDA C, MD  Most closely with heart failure clinic.  Will let them manage her medications regarding her heart failure and CAD.  Will refill torsemide to reflect new increase in dose.

## 2018-07-01 NOTE — Assessment & Plan Note (Signed)
Refill tramadol.  Reviewed avoidance of NSAIDs due to CKD pain.  Patient does not take NSAIDs and understands there side effects.

## 2018-07-01 NOTE — Patient Instructions (Addendum)
It was nice meeting you today Ms. Ferg!  Today, we will measure the protein in your urine and schedule a DEXA scan (bone density scan).  We will also research the pricing of Victoza versus Lantus for your diabetes, and I will call you if the pricing will enable [place] to change your prescription.  I refilled your Tramadol and Ferrous sulfate (sent to [facility]) and Torsemide (sent to [facility]).  Please return in about 3 months for a follow up visit.  If you have any questions or concerns, please feel free to call the clinic.   Be well,  Dr. Shan Levans

## 2018-07-01 NOTE — Progress Notes (Signed)
Subjective:    [name] - 77 y.o. female MRN [id number]  Date of birth: August 13, 1941  HPI  [name] is here for diabetes follow up.  She would also like to discuss her iron deficiency anemia, her swelling, and medication adjustments.  Type 2 diabetes Fasting blood sugars around 120 each morning Patient continues to take Lantus 23 units daily No hypoglycemic episodes recently  Has some tingling in her bilateral feet, but no numbness  Anemia Anemia panel recently done, which shows borderline iron deficiency anemia Hemoglobin was as low as 8.5, so she started Feraheme infusions, and her IMA globin has improved to 10.9 Is due for a CT scan on Friday to determine if she has any internal bleeding Reports no bloody stools or melena She also feels like she has more energy She continues to take ferrous sulfate and would like a refill on this  Pedal edema Follows closely with the heart failure clinic Weighs herself daily and adjust diuretic dosing as needed Takes torsemide 80 mg in the morning and 60 mg at night Takes metolazone as needed with the permission of her heart failure clinic  Arthritis Takes tramadol and Tylenol for this, which is helpful for her No exacerbations Needs refill of tramadol     Health Maintenance:  -  Health Maintenance Due  Topic Date Due  . OPHTHALMOLOGY EXAM  03/26/1949  . URINE MICROALBUMIN  03/26/1949  . TETANUS/TDAP  03/26/1958  . DEXA SCAN  03/26/2004  . INFLUENZA VACCINE  06/12/2018    -  reports that she has never smoked. She has never used smokeless tobacco. - Review of Systems: Per HPI. - Past Medical History: Patient Active Problem List   Diagnosis Date Noted  . Iron deficiency anemia 05/19/2018  . OSA on CPAP   . Longstanding persistent atrial fibrillation ([place])   . Ischemic cardiomyopathy   . Hypertension   . Hemiparesis ([place])   . Facial weakness, post-stroke   . Chronic systolic CHF (congestive heart failure) ([place])     . Chronic lower back pain   . Chronic kidney disease (CKD), stage III (moderate) (HCC)   . Arthritis   . Venous stasis 09/13/2017  . Open wound of right knee, leg, and ankle with complication 29/52/8413  . Bilateral leg edema 07/18/2017  . Anemia 05/07/2017  . Abdominal mass, RLQ (right lower quadrant)   . History of CVA with residual deficit 03/26/2017  . Pulmonary hypertension ([place]) 03/26/2017  . Morbid obesity ([place]) 03/26/2017  . Gastrointestinal hemorrhage 03/26/2017  . Rectal bleeding   . Persistent atrial fibrillation ([place])   . Chronic pain syndrome   . Coronary artery disease involving coronary bypass graft of native heart without angina pectoris   . Benign essential HTN   . DM type 2 with diabetic peripheral neuropathy ([place])   . Thrombocytopenia ([place])   . Cerebrovascular accident (CVA) due to embolism of right middle cerebral artery ([place])   . CKD (chronic kidney disease) stage 3, GFR 30-59 ml/min (HCC) 03/30/2016  . Chronic anticoagulation 03/30/2016  . Hyperlipidemia 01/31/2009  . ACC/AHA stage C congestive heart failure due to ischemic cardiomyopathy ([place]) 01/31/2009  . Chronic systolic heart failure ([place]) 01/31/2009  . CAD (coronary artery disease) 05/12/2008   - Medications: reviewed and updated   Objective:   Physical Exam BP 118/72 (BP Location: Left Arm, Patient Position: Sitting, Cuff Size: Large)   Pulse 69   Temp 97.7 F (36.5 C) (Oral)   Wt 237 lb  9.6 oz (107.8 kg)   LMP  (LMP Unknown)   SpO2 99%   BMI 35.09 kg/m  Gen: NAD, alert, cooperative with exam, pleasant, appears somewhat pale CV: RRR, good S1/S2, no murmur, 1+ pitting pedal edema bilaterally Resp: CTABL, no wheezes, non-labored Abd: SNTND, no guarding or organomegaly Psych: good insight, alert and oriented     Assessment & Plan:   DM type 2 with diabetic peripheral neuropathy ([place]) Since patient's A1c is 6.3, she may benefit from transitioning off of Lantus and taking a GLP-1 inhibitor  such as Victoza.  However, patient is currently in the donut hole, so we will need to see if this medication would be affordable for her.  Lantus is also expensive, but we will likely not switch to Victoza if Victoza cost more than Lantus.  Cardioprotective and weight loss benefits of Victoza were detailed with patient.  Patient is amenable to this change to see if affordable.  #2 obtain urine microalbumin today, but patient cannot urinate.  After the visit was complete, patient asked if I could prescribe her diabetic shoes.  Will work on this and contact patient when prescription is ready.  Iron deficiency anemia Likely in combination with anemia of chronic disease.  Will refill patient's ferrous sulfate.  Bilateral leg edema Most closely with heart failure clinic.  Will let them manage her medications regarding her heart failure and CAD.  Will refill torsemide to reflect new increase in dose.  Chronic lower back pain Refill tramadol.  Reviewed avoidance of NSAIDs due to CKD pain.  Patient does not take NSAIDs and understands there side effects.    Maia Breslow, M.D. 07/01/2018, 12:22 PM PGY-2, [facility]

## 2018-07-04 ENCOUNTER — Ambulatory Visit
Admission: RE | Admit: 2018-07-04 | Discharge: 2018-07-04 | Disposition: A | Discharge status: Home / Self Care | Payer: Medicare Other | Source: Ambulatory Visit | Attending: Gastroenterology | Admitting: Gastroenterology

## 2018-07-04 DIAGNOSIS — K8020 Calculus of gallbladder without cholecystitis without obstruction: Secondary | ICD-10-CM | POA: Diagnosis not present

## 2018-07-04 DIAGNOSIS — D508 Other iron deficiency anemias: Secondary | ICD-10-CM

## 2018-07-04 DIAGNOSIS — G4733 Obstructive sleep apnea (adult) (pediatric): Secondary | ICD-10-CM | POA: Diagnosis not present

## 2018-07-10 ENCOUNTER — Encounter: Payer: Self-pay | Admitting: *Deleted

## 2018-07-10 NOTE — Progress Notes (Signed)
07/03/18 - ROI for health maintenance records faxed to Dr. Deloria Lair for retinal/eye exam [phone]).  ROI placed in scan box to be scanned into patient's EMR.   Burna Forts, BSN, RN-BC

## 2018-07-11 ENCOUNTER — Encounter: Payer: Self-pay | Admitting: Family Medicine

## 2018-07-15 ENCOUNTER — Other Ambulatory Visit: Payer: Self-pay | Admitting: Family Medicine

## 2018-07-15 DIAGNOSIS — H43822 Vitreomacular adhesion, left eye: Secondary | ICD-10-CM | POA: Diagnosis not present

## 2018-07-15 DIAGNOSIS — H353221 Exudative age-related macular degeneration, left eye, with active choroidal neovascularization: Secondary | ICD-10-CM | POA: Diagnosis not present

## 2018-07-15 DIAGNOSIS — H353112 Nonexudative age-related macular degeneration, right eye, intermediate dry stage: Secondary | ICD-10-CM | POA: Diagnosis not present

## 2018-07-15 DIAGNOSIS — E113391 Type 2 diabetes mellitus with moderate nonproliferative diabetic retinopathy without macular edema, right eye: Secondary | ICD-10-CM | POA: Diagnosis not present

## 2018-07-15 DIAGNOSIS — H353122 Nonexudative age-related macular degeneration, left eye, intermediate dry stage: Secondary | ICD-10-CM | POA: Diagnosis not present

## 2018-07-23 ENCOUNTER — Other Ambulatory Visit: Payer: Self-pay

## 2018-07-23 ENCOUNTER — Ambulatory Visit (HOSPITAL_COMMUNITY)
Admission: RE | Admit: 2018-07-23 | Discharge: 2018-07-23 | Disposition: A | Discharge status: Home / Self Care | Payer: Medicare Other | Source: Ambulatory Visit | Attending: Cardiology | Admitting: Cardiology

## 2018-07-23 ENCOUNTER — Encounter (HOSPITAL_COMMUNITY): Payer: Self-pay | Admitting: Cardiology

## 2018-07-23 VITALS — BP 107/60 | HR 79 | Wt 236.2 lb

## 2018-07-23 DIAGNOSIS — Z79899 Other long term (current) drug therapy: Secondary | ICD-10-CM | POA: Insufficient documentation

## 2018-07-23 DIAGNOSIS — I255 Ischemic cardiomyopathy: Secondary | ICD-10-CM | POA: Insufficient documentation

## 2018-07-23 DIAGNOSIS — I25810 Atherosclerosis of coronary artery bypass graft(s) without angina pectoris: Secondary | ICD-10-CM

## 2018-07-23 DIAGNOSIS — E1122 Type 2 diabetes mellitus with diabetic chronic kidney disease: Secondary | ICD-10-CM | POA: Diagnosis not present

## 2018-07-23 DIAGNOSIS — Z882 Allergy status to sulfonamides status: Secondary | ICD-10-CM | POA: Insufficient documentation

## 2018-07-23 DIAGNOSIS — Z951 Presence of aortocoronary bypass graft: Secondary | ICD-10-CM | POA: Insufficient documentation

## 2018-07-23 DIAGNOSIS — I482 Chronic atrial fibrillation: Secondary | ICD-10-CM | POA: Diagnosis not present

## 2018-07-23 DIAGNOSIS — I2510 Atherosclerotic heart disease of native coronary artery without angina pectoris: Secondary | ICD-10-CM | POA: Insufficient documentation

## 2018-07-23 DIAGNOSIS — Z8673 Personal history of transient ischemic attack (TIA), and cerebral infarction without residual deficits: Secondary | ICD-10-CM | POA: Insufficient documentation

## 2018-07-23 DIAGNOSIS — I5022 Chronic systolic (congestive) heart failure: Secondary | ICD-10-CM | POA: Insufficient documentation

## 2018-07-23 DIAGNOSIS — Z09 Encounter for follow-up examination after completed treatment for conditions other than malignant neoplasm: Secondary | ICD-10-CM | POA: Insufficient documentation

## 2018-07-23 DIAGNOSIS — G4733 Obstructive sleep apnea (adult) (pediatric): Secondary | ICD-10-CM | POA: Insufficient documentation

## 2018-07-23 DIAGNOSIS — Z836 Family history of other diseases of the respiratory system: Secondary | ICD-10-CM | POA: Diagnosis not present

## 2018-07-23 DIAGNOSIS — Z8249 Family history of ischemic heart disease and other diseases of the circulatory system: Secondary | ICD-10-CM | POA: Insufficient documentation

## 2018-07-23 DIAGNOSIS — Z794 Long term (current) use of insulin: Secondary | ICD-10-CM | POA: Diagnosis not present

## 2018-07-23 DIAGNOSIS — N183 Chronic kidney disease, stage 3 (moderate): Secondary | ICD-10-CM | POA: Insufficient documentation

## 2018-07-23 DIAGNOSIS — Z7901 Long term (current) use of anticoagulants: Secondary | ICD-10-CM | POA: Diagnosis not present

## 2018-07-23 DIAGNOSIS — Z888 Allergy status to other drugs, medicaments and biological substances status: Secondary | ICD-10-CM | POA: Insufficient documentation

## 2018-07-23 DIAGNOSIS — Z885 Allergy status to narcotic agent status: Secondary | ICD-10-CM | POA: Diagnosis not present

## 2018-07-23 DIAGNOSIS — I272 Pulmonary hypertension, unspecified: Secondary | ICD-10-CM | POA: Diagnosis not present

## 2018-07-23 DIAGNOSIS — I130 Hypertensive heart and chronic kidney disease with heart failure and stage 1 through stage 4 chronic kidney disease, or unspecified chronic kidney disease: Secondary | ICD-10-CM | POA: Diagnosis not present

## 2018-07-23 LAB — BASIC METABOLIC PANEL
Anion gap: 10 (ref 5–15)
BUN: 52 mg/dL — AB (ref 8–23)
CALCIUM: 10.7 mg/dL — AB (ref 8.9–10.3)
CO2: 27 mmol/L (ref 22–32)
CREATININE: 1.87 mg/dL — AB (ref 0.44–1.00)
Chloride: 99 mmol/L (ref 98–111)
GFR calc Af Amer: 28 mL/min — ABNORMAL LOW (ref >60)
GFR, EST NON AFRICAN AMERICAN: 24 mL/min — AB (ref >60)
GLUCOSE: 96 mg/dL (ref 70–99)
POTASSIUM: 5 mmol/L (ref 3.5–5.1)
SODIUM: 136 mmol/L (ref 135–145)

## 2018-07-23 LAB — CBC
HCT: 41.8 % (ref 36.0–46.0)
Hemoglobin: 12.9 g/dL (ref 12.0–15.0)
MCH: 29.7 pg (ref 26.0–34.0)
MCHC: 30.9 g/dL (ref 30.0–36.0)
MCV: 96.1 fL (ref 78.0–100.0)
Platelets: 112 10*3/uL — ABNORMAL LOW (ref 150–400)
RBC: 4.35 MIL/uL (ref 3.87–5.11)
RDW: 17.5 % — ABNORMAL HIGH (ref 11.5–15.5)
WBC: 5.8 10*3/uL (ref 4.0–10.5)

## 2018-07-23 MED ORDER — SPIRONOLACTONE 25 MG PO TABS: 25.0000 mg | ORAL_TABLET | Freq: Every day | ORAL | 3 refills | Status: DC

## 2018-07-23 NOTE — Patient Instructions (Signed)
Increase Spironolactone to 25 mg (1 tab) daily  Labs today  Labs in 10 days  Your physician recommends that you schedule a follow-up appointment in: 2 months

## 2018-07-24 DIAGNOSIS — D509 Iron deficiency anemia, unspecified: Secondary | ICD-10-CM | POA: Diagnosis not present

## 2018-07-24 NOTE — Progress Notes (Signed)
Advanced Heart Failure Clinic Note   Primary Care: Dr. Shan Levans Primary Cardiologist: Dr. Angelena Form HF Cardiology: Dr. Aundra Dubin  HPI:  [name] is a 77 y.o. female with history of CAD status post CABG 2009, ischemic cardiomyopathy with chronic systolic CHF, persistent atrial fibrillation with stroke in 03/2016 when INR was subtherapeutic, stage III CKD, morbid obesity, pulmonary hypertension, diabetes, GI bleed 03/2017, and severe sleep apnea.   Seen in [facility] clinics 09/10/18 and 09/24/18, both times with volume overload and marked peripheral edema with skin breakdown. Torsemide increased at each visit. Echo in 7/19 showed EF 45-50% with mild RV dilation/mild RV systolic dysfunction and D-shaped interventricular septum.    She returned today for followup of CHF.  She is still waiting for CPAP machine.  Overall doing better.  Weight is down 12 lbs.  No significant dyspnea with walker (needs walker for balance).  Has been sleeping in a chair for a long time. No PND.  No chest pain.  No lightheadedness/palpitations.    Labs (12/18): K 3.9, Creatinine 1.65 Labs (5/19): K 4.1, creatinine 1.7 Labs (6/19): hgb 8.2 Labs (8/19): K 4.3, creatinine 1.73  Review of systems complete and found to be negative unless listed in HPI.    Past Medical History 1. Chronic systolic CHF: Ischemic cardiomyopathy with prominent RV failure.   - Echo (11/18): LVEF 35-40%, severe RV dilation, severe RAE, severe TR.  - Echo (7/19): EF 45-50%, diffuse hypokinesis, mild RV dilation with mildly decreased RV systolic function, D-shaped interventricular septum suggestive of RV pressure/volume overload, mild MR, moderate TR, PASP 49 mmhg.  2. CAD: CABG 2009. No angiography since that time.   3. HTN 4. Atrial fibrillation: Chronic since 2013.  5. CKD stage 3 6. Morbid obesity 7. OSA 8. Chronic venous stasis with RLE wound/Bullae 9. CVA 5/18.   Current Outpatient Medications  Medication Sig Dispense Refill  .  apixaban (ELIQUIS) 5 MG TABS tablet TAKE ONE (1) TABLET BY MOUTH TWO (2) TIMES DAILY 60 tablet 11  . atorvastatin (LIPITOR) 40 MG tablet TAKE 1 TABLET BY MOUTH  DAILY AT 6 PM. 90 tablet 1  . carvedilol (COREG) 6.25 MG tablet Take 1 tablet (6.25 mg total) by mouth 2 (two) times daily with a meal. 60 tablet 6  . cholecalciferol (VITAMIN D) 1000 UNITS tablet Take 1,000 Units by mouth 2 (two) times daily.    Marland Kitchen docusate sodium (COLACE) 100 MG capsule Take 200 mg by mouth at bedtime.     . ferrous sulfate 325 (65 FE) MG tablet Take 1 tablet (325 mg total) by mouth 2 (two) times daily with a meal. 60 tablet 3  . glucose blood (ONE TOUCH ULTRA TEST) test strip Use three times a day 100 each 3  . Insulin Glargine (LANTUS SOLOSTAR) 100 UNIT/ML Solostar Pen INJECT 23 UNITS INTO SKIN AT BEDTIME 15 mL 3  . Insulin Pen Needle (PEN NEEDLES) 32G X 4 MM MISC 1 each by Does not apply route at bedtime. Use to inject Lantus each night. 90 each 1  . losartan (COZAAR) 25 MG tablet Take 0.5 tablets (12.5 mg total) by mouth daily. 15 tablet 11  . nitroGLYCERIN (NITROSTAT) 0.4 MG SL tablet Place 0.4 mg under the tongue every 5 (five) minutes as needed for chest pain (up to 3 doses).     . potassium chloride SA (K-DUR,KLOR-CON) 20 MEQ tablet Take 2 tablets (40 mEq total) by mouth daily. 60 tablet 11  . rOPINIRole (REQUIP) 0.5 MG tablet TAKE 2  TABLETS BY MOUTH AT  BEDTIME 180 tablet 0  . spironolactone (ALDACTONE) 25 MG tablet Take 1 tablet (25 mg total) by mouth daily. 90 tablet 3  . torsemide (DEMADEX) 20 MG tablet Take 80 mg (4 Tablets) in the AM and 60 mg (3 Tablets) in the PM 215 tablet 3  . traMADol (ULTRAM) 50 MG tablet TAKE 2 TABLETS BY MOUTH EVERY MORNING AND TAKE 1 TABLET AT NIGHT 90 tablet 0  . vitamin B-12 (CYANOCOBALAMIN) 1000 MCG tablet Take 1 tablet (1,000 mcg total) by mouth daily. 90 tablet 0   No current facility-administered medications for this encounter.    Allergies  Allergen Reactions  . Benazepril  Other (See Comments)    Unknown reaction at age 68-65 - possibly dizziness  . Angiotensin Receptor Blockers Other (See Comments)    Hypotension reaction  . Fe-Succ-C-Thre-B12-Des Stomach Other (See Comments)    Unknown reaction  . Sulfamethoxazole-Trimethoprim Other (See Comments)    Unknown reaction   Social History   Socioeconomic History  . Marital status: Widowed    Spouse name: Not on file  . Number of children: Not on file  . Years of education: Not on file  . Highest education level: Not on file  Occupational History  . Not on file  Social Needs  . Financial resource strain: Not on file  . Food insecurity:    Worry: Not on file    Inability: Not on file  . Transportation needs:    Medical: Not on file    Non-medical: Not on file  Tobacco Use  . Smoking status: Never Smoker  . Smokeless tobacco: Never Used  Substance and Sexual Activity  . Alcohol use: No  . Drug use: No  . Sexual activity: Not Currently  Lifestyle  . Physical activity:    Days per week: Not on file    Minutes per session: Not on file  . Stress: Not on file  Relationships  . Social connections:    Talks on phone: Not on file    Gets together: Not on file    Attends religious service: Not on file    Active member of club or organization: Not on file    Attends meetings of clubs or organizations: Not on file    Relationship status: Not on file  . Intimate partner violence:    Fear of current or ex partner: Not on file    Emotionally abused: Not on file    Physically abused: Not on file    Forced sexual activity: Not on file  Other Topics Concern  . Not on file  Social History Narrative   Widowed   Lives alone in [place]   2 children   Not routinely exercising   Family History  Problem Relation Age of Onset  . Clotting disorder Mother        Cerebral hemorrhage  . Emphysema Father        COD  . Lung cancer Brother   . Heart disease Neg Hx    Vitals:   07/23/18 0840  BP:  107/60  Pulse: 79  SpO2: 100%  Weight: 107.2 kg (236 lb 4 oz)     Wt Readings from Last 3 Encounters:  07/23/18 107.2 kg (236 lb 4 oz)  07/01/18 107.8 kg (237 lb 9.6 oz)  06/03/18 112.6 kg (248 lb 3.2 oz)    PHYSICAL EXAM: General: NAD Neck: No JVD, no thyromegaly or thyroid nodule.  Lungs: Clear to auscultation bilaterally with  normal respiratory effort. CV: Nondisplaced PMI.  Heart irregular S1/S2, no S3/S4, no murmur.  Trace ankle edema.  No carotid bruit.  Normal pedal pulses.  Abdomen: Soft, nontender, no hepatosplenomegaly, no distention.  Skin: Intact without lesions or rashes.  Neurologic: Alert and oriented x 3.  Psych: Normal affect. Extremities: No clubbing or cyanosis.  HEENT: Normal.   ASSESSMENT & PLAN:  1. Chronic systolic CHF with prominent RV failure: Ischemic cardiomyopathy.  Echo in 7/19 showed LV EF 45-50% (improved) with mildly dilated/mildly dysfunction RV.  D-shaped septum suggested RV pressure/volume overload.  Weight is down another 12 lbs.  On exam today, she does not looks significant volume overloaded.  - Continue torsemide 80 qam/60 qpm.   - Continue coreg 6.25 mg BID - Increase spironolactone to 25 mg daily.  BMET today and in 10 days.   - Continue losartan 12.5 qhs.  2. CAD sp CABG: No chest pain.  - Continue atorvastatin 40 mg daily.  - No ASA with stable CAD on Eliquis.  3. Atrial fibrillation: Chronic. Given long-term atrial fibrillation, she is unlikely to successfully cardiovert.  - Continue Eliquis. CBC today.  4. CKD stage 3 - BMET today.  5. H/o CVA: Continue Eliquis.   6. OSA: Still needs CPAP.  Will contact Dr. Radford Pax.  Followup in 6 wks.   Loralie Champagne 07/24/2018

## 2018-07-25 ENCOUNTER — Telehealth (HOSPITAL_COMMUNITY): Payer: Self-pay | Admitting: *Deleted

## 2018-07-25 NOTE — Telephone Encounter (Signed)
Result Notes for Basic metabolic panel   Notes recorded by Darron Doom, RN on 07/25/2018 at 10:43 AM EDT Spoke with patient and she will stop KCL. Already has lab appt scheduled for 10 day bmet. MAR updated. ------  Notes recorded by Larey Dresser, MD on 07/24/2018 at 9:39 PM EDT Stop KCl, repeat BMET 1 week.

## 2018-07-28 ENCOUNTER — Telehealth: Payer: Self-pay | Admitting: Family Medicine

## 2018-07-28 NOTE — Telephone Encounter (Signed)
Pt would like for Dr. Shan Levans to call her. The best call back number is [phone].

## 2018-07-29 ENCOUNTER — Other Ambulatory Visit: Payer: Self-pay | Admitting: Family Medicine

## 2018-07-29 ENCOUNTER — Telehealth: Payer: Self-pay | Admitting: Family Medicine

## 2018-07-29 DIAGNOSIS — S31000A Unspecified open wound of lower back and pelvis without penetration into retroperitoneum, initial encounter: Secondary | ICD-10-CM

## 2018-07-29 MED ORDER — FERROUS SULFATE 325 (65 FE) MG PO TABS: 325.0000 mg | ORAL_TABLET | Freq: Two times a day (BID) | ORAL | 3 refills | Status: DC

## 2018-07-29 MED ORDER — NYSTATIN 100000 UNIT/GM EX POWD: Freq: Four times a day (QID) | CUTANEOUS | 0 refills | Status: DC

## 2018-07-29 NOTE — Telephone Encounter (Signed)
Attempted to reach patient and left VM stating that I would call again later today.  I also said that it may be helpful for her to leave a message with the front office staff that is a specific question or request so that I can work on it before calling her back.

## 2018-07-29 NOTE — Telephone Encounter (Signed)
Called [name] back to say that I have placed a home health RN order for her wound to be assessed.  Since it may take some time for this to be arranged, we agreed to make an appointment for her to be seen by Dr. Erin Hearing on 9/24.  I also called in a nystatin powder and encouraged her to use a barrier cream as needed, since this may be a combination of irritation and fungal infection since it is in a moist area.  She was happy with this plan.

## 2018-07-29 NOTE — Telephone Encounter (Signed)
Called Ms. Shuman back, and she reports that she has an area that is burning and is an open wound on her gluteal cleft, which has been going on for the last three weeks.  She said that Dr. Juanito Doom once had a wound care team treat her at home for a leg wound, and she is wondering if I can do the same for this issue.  I told her that she would benefit from being seen in our clinic first so that we can decide what the nature of her wound is before placing a wound care consult, but after looking at the appointment schedule, there is nothing available for over a week.  Therefore, I will see what I can do about an at home wound care consult and plan to see her at her previously scheduled appointment in October.

## 2018-08-04 DIAGNOSIS — G4733 Obstructive sleep apnea (adult) (pediatric): Secondary | ICD-10-CM | POA: Diagnosis not present

## 2018-08-05 ENCOUNTER — Encounter: Payer: Self-pay | Admitting: Family Medicine

## 2018-08-05 ENCOUNTER — Ambulatory Visit (INDEPENDENT_AMBULATORY_CARE_PROVIDER_SITE_OTHER): Payer: Medicare Other | Admitting: Family Medicine

## 2018-08-05 ENCOUNTER — Other Ambulatory Visit: Payer: Self-pay

## 2018-08-05 VITALS — BP 122/78 | HR 75 | Temp 97.9°F | Ht 69.0 in | Wt 232.6 lb

## 2018-08-05 DIAGNOSIS — E1142 Type 2 diabetes mellitus with diabetic polyneuropathy: Secondary | ICD-10-CM | POA: Diagnosis not present

## 2018-08-05 DIAGNOSIS — Z23 Encounter for immunization: Secondary | ICD-10-CM | POA: Diagnosis not present

## 2018-08-05 DIAGNOSIS — B372 Candidiasis of skin and nail: Secondary | ICD-10-CM | POA: Insufficient documentation

## 2018-08-05 DIAGNOSIS — L304 Erythema intertrigo: Secondary | ICD-10-CM

## 2018-08-05 DIAGNOSIS — M21969 Unspecified acquired deformity of unspecified lower leg: Secondary | ICD-10-CM | POA: Diagnosis not present

## 2018-08-05 HISTORY — DX: Erythema intertrigo: L30.4

## 2018-08-05 LAB — POCT GLYCOSYLATED HEMOGLOBIN (HGB A1C): HBA1C, POC (CONTROLLED DIABETIC RANGE): 6.1 % (ref 0.0–7.0)

## 2018-08-05 NOTE — Assessment & Plan Note (Addendum)
Patient complaining of skin irritation characterized as pruritic and burning at gluteal crease for 2.5 weeks now. Objectively, patient is afebrile with skin findings most consistent with intertriginous candidiasis. No symptoms or concerns for secondary bacterial infection. Patient is obese, sedentary, and has history of intertriginous irritation below the breasts, pannus and inguinal folds. Patient was prescribed Nystatin powder about one week ago, by her PCP, Dr. Shan Levans, and reports improvement with use. Plan to replace Nystatin powder with over the counter terbinafine or clotrimazole cream applied twice a day. Aeration of the affected area with no undergarments, loose fitting clothing, and reducing moisture by drying area with hair dryer/fan on cool setting. Expectations of improvement in about 2-3 weeks. Patient given return precautions.

## 2018-08-05 NOTE — Assessment & Plan Note (Signed)
Patient  is requesting referral to [facility] in [place] to be fitted for diabetic shoes. Patient endorses long standing peripheral neuropathy, but does not have any ulcers or other wounds. Patient advised to treat the bony prominences of her right foot at her midfoot and second toe with Vaseline to prevent friction in her footwear.

## 2018-08-05 NOTE — Patient Instructions (Addendum)
It was great meeting you today. [name]!  1 For your skin irritation, it is best to treat at 2 times a day with Terbinafine (Lamisil) or clotrimazole (Lotrimin) over the counter. Keep the areas dry by airing out! Try using a blow dryer on cool setting or fan especially after showers to prevent moisture. Corn starch and other powders may increase irritation at your skin folds/other sites of irritation.   2 Dr. Erin Hearing has sent your your referral to [facility]. For the bony prominences on the top of your right foot and your second toe, use Vaseline to prevent rubbing/friction in your shoe.   [name], Medical Student   Skin Yeast Infection Skin yeast infection is a condition in which there is an overgrowth of yeast (candida) that normally lives on the skin. This condition usually occurs in areas of the skin that are constantly warm and moist, such as the armpits or the groin. What are the causes? This condition is caused by a change in the normal balance of the yeast and bacteria that live on the skin. What increases the risk? This condition is more likely to develop in:  People who are obese.  Pregnant women.  Women who take birth control pills.  People who have diabetes.  People who take antibiotic medicines.  People who take steroid medicines.  People who are malnourished.  People who have a weak defense (immune) system.  People who are 27 years of age or older.  What are the signs or symptoms? Symptoms of this condition include:  A red, swollen area of the skin.  Bumps on the skin.  Itchiness.  How is this diagnosed? This condition is diagnosed with a medical history and physical exam. Your health care provider may check for yeast by taking light scrapings of the skin to be viewed under a microscope. How is this treated? This condition is treated with medicine. Medicines may be prescribed or be available over-the-counter. The medicines may  be:  Taken by mouth (orally).  Applied as a cream.  Follow these instructions at home:  Take or apply over-the-counter and prescription medicines only as told by your health care provider.  Eat more yogurt. This may help to keep your yeast infection from returning.  Maintain a healthy weight. If you need help losing weight, talk with your health care provider.  Keep your skin clean and dry.  If you have diabetes, keep your blood sugar under control. Contact a health care provider if:  Your symptoms go away and then return.  Your symptoms do not get better with treatment.  Your symptoms get worse.  Your rash spreads.  You have a fever or chills.  You have new symptoms.  You have new warmth or redness of your skin. This information is not intended to replace advice given to you by your health care provider. Make sure you discuss any questions you have with your health care provider. Document Released: 07/17/2011 Document Revised: 06/24/2016 Document Reviewed: 05/02/2015 Elsevier Interactive Patient Education  [employer].

## 2018-08-05 NOTE — Progress Notes (Signed)
Subjective:   Patient ID: [name]    DOB: September 24, 1941, 77 y.o. female   MRN: [id number]  Chief Complaint: skin irritation  History of Present Illness: [name] is a 77 y.o. female with extensive past medical history including obesity, diabetes, hypertension, CAD, CHF, and hyperlipidemia who presents to clinic today for,   Skin irration to her "crack" for about 2.5 weeks now. Patient reports burning sensation at the site and some intermittent bleeding since onset. Patient denies aggravating or alleviating factors. Patient's in home CNA has treated with over the counter [place] with no relief.  Patient called her PCP Dr. Shan Levans about the irritation, 07/29/2018, about one week ago, and was prescribed Nystatin powder. Patient reports improvement of her burning pain with use. Patient reports history of irration to other skin folds such as under her breast, her stomach/pannus and inguinal folds for which she uses corn starch. Patient is obese and after a stroke in 2018 has lived a sedentary lifestyles; she is able to make it to the bathroom on her own.   Bony prominence on the top of her right foot. Patient states that her in home CNA who trims her toenails noticed it about 3 months ago and suggested that the patient have a doctor look at it. Patient denies pain at the site or interference with her walking. Patient has diabetes and requests a referral to [facility] in [place] for diabetic shoes.    Review of Systems  Constitutional: Negative for chills and fever.  Gastrointestinal: Negative for nausea and vomiting.  Skin: Positive for color change, rash and wound.    Current Outpatient Medications  Medication Sig Dispense Refill  . apixaban (ELIQUIS) 5 MG TABS tablet TAKE ONE (1) TABLET BY MOUTH TWO (2) TIMES DAILY 60 tablet 11  . atorvastatin (LIPITOR) 40 MG tablet TAKE 1 TABLET BY MOUTH  DAILY AT 6 PM. 90 tablet 1  . carvedilol (COREG) 6.25 MG tablet Take 1 tablet (6.25 mg  total) by mouth 2 (two) times daily with a meal. 60 tablet 6  . cholecalciferol (VITAMIN D) 1000 UNITS tablet Take 1,000 Units by mouth 2 (two) times daily.    Marland Kitchen docusate sodium (COLACE) 100 MG capsule Take 200 mg by mouth at bedtime.     . ferrous sulfate 325 (65 FE) MG tablet Take 1 tablet (325 mg total) by mouth 2 (two) times daily with a meal. 60 tablet 3  . glucose blood (ONE TOUCH ULTRA TEST) test strip Use three times a day 100 each 3  . Insulin Glargine (LANTUS SOLOSTAR) 100 UNIT/ML Solostar Pen INJECT 23 UNITS INTO SKIN AT BEDTIME 15 mL 3  . Insulin Pen Needle (PEN NEEDLES) 32G X 4 MM MISC 1 each by Does not apply route at bedtime. Use to inject Lantus each night. 90 each 1  . losartan (COZAAR) 25 MG tablet Take 0.5 tablets (12.5 mg total) by mouth daily. 15 tablet 11  . nitroGLYCERIN (NITROSTAT) 0.4 MG SL tablet Place 0.4 mg under the tongue every 5 (five) minutes as needed for chest pain (up to 3 doses).     . nystatin (MYCOSTATIN/NYSTOP) powder Apply topically 4 (four) times daily. 45 g 0  . rOPINIRole (REQUIP) 0.5 MG tablet TAKE 2 TABLETS BY MOUTH AT  BEDTIME 180 tablet 0  . spironolactone (ALDACTONE) 25 MG tablet Take 1 tablet (25 mg total) by mouth daily. 90 tablet 3  . torsemide (DEMADEX) 20 MG tablet Take 80 mg (4 Tablets)  in the AM and 60 mg (3 Tablets) in the PM 215 tablet 3  . traMADol (ULTRAM) 50 MG tablet TAKE 2 TABLETS BY MOUTH EVERY MORNING AND TAKE 1 TABLET AT NIGHT 90 tablet 0  . vitamin B-12 (CYANOCOBALAMIN) 1000 MCG tablet Take 1 tablet (1,000 mcg total) by mouth daily. 90 tablet 0   No current facility-administered medications for this visit.     Objective:  BP 122/78   Pulse 75   Temp 97.9 F (36.6 C) (Oral)   Ht 5\' 9"  (1.753 m)   Wt 232 lb 9.6 oz (105.5 kg)   LMP  (LMP Unknown)   SpO2 98%   BMI 34.35 kg/m   Physical Exam  Constitutional: No distress.  Cardiovascular: Normal rate and normal heart sounds.  Pulmonary/Chest: Effort normal and breath sounds  normal.  Musculoskeletal:  Lower extremity edema bilaterally  Left foot: midfoot bony prominence at the dorsum, no tenderness to palpation. Contracted second toe   Skin:  odorous. 1 cm fissure at the gluteal apex. Pruritic skin irritation at the gluteal crease with lichenification. No surrounding erythema. no purluent drainage   Nursing note and vitals reviewed.   Assessment & Plan:  [name] is a 77 y.o. woman with extensive medical history who presents to clinic today complaining of intertrigo.  Intertrigo Patient complaining of skin irritation characterized as pruritic and burning at gluteal crease for 2.5 weeks now. Objectively, patient is afebrile with skin findings most consistent with intertriginous candidiasis. No symptoms or concerns for secondary bacterial infection. Patient is obese, sedentary, and has history of intertriginous irritation below the breasts, pannus and inguinal folds. Patient was prescribed Nystatin powder about one week ago, by her PCP, Dr. Shan Levans, and reports improvement with use. Plan to replace Nystatin powder with over the counter terbinafine or clotrimazole cream applied twice a day. Aeration of the affected area with no undergarments, loose fitting clothing, and reducing moisture by drying area with hair dryer/fan on cool setting. Expectations of improvement in about 2-3 weeks. Patient given return precautions.   Foot deformity Patient  is requesting referral to [facility] in [place] to be fitted for diabetic shoes. Patient endorses long standing peripheral neuropathy, but does not have any ulcers or other wounds. Patient advised to treat the bony prominences of her right foot at her midfoot and second toe with Vaseline to prevent friction in her footwear.   Orders Placed This Encounter  Procedures  . HgB A1c   No orders of the defined types were placed in this encounter.   [name], [facility] 08/05/2018 8:31 AM   I was present during the history and physical and agree with above  Lind Covert

## 2018-08-06 ENCOUNTER — Ambulatory Visit (HOSPITAL_COMMUNITY)
Admission: RE | Admit: 2018-08-06 | Discharge: 2018-08-06 | Disposition: A | Discharge status: Home / Self Care | Payer: Medicare Other | Source: Ambulatory Visit | Attending: Internal Medicine | Admitting: Internal Medicine

## 2018-08-06 ENCOUNTER — Encounter: Payer: Self-pay | Admitting: Family Medicine

## 2018-08-06 DIAGNOSIS — I5022 Chronic systolic (congestive) heart failure: Secondary | ICD-10-CM

## 2018-08-06 LAB — BASIC METABOLIC PANEL
Anion gap: 11 (ref 5–15)
BUN: 50 mg/dL — ABNORMAL HIGH (ref 8–23)
CO2: 31 mmol/L (ref 22–32)
Calcium: 10.6 mg/dL — ABNORMAL HIGH (ref 8.9–10.3)
Chloride: 96 mmol/L — ABNORMAL LOW (ref 98–111)
Creatinine, Ser: 1.56 mg/dL — ABNORMAL HIGH (ref 0.44–1.00)
GFR calc Af Amer: 35 mL/min — ABNORMAL LOW (ref >60)
GFR, EST NON AFRICAN AMERICAN: 30 mL/min — AB (ref >60)
GLUCOSE: 90 mg/dL (ref 70–99)
Potassium: 4 mmol/L (ref 3.5–5.1)
Sodium: 138 mmol/L (ref 135–145)

## 2018-08-07 ENCOUNTER — Telehealth (HOSPITAL_COMMUNITY): Payer: Self-pay

## 2018-08-07 NOTE — Telephone Encounter (Signed)
Pt called and informed that her creatinine was better and no changes are being made at this time. Pt verbalized she understood, no questions at this time

## 2018-08-14 ENCOUNTER — Other Ambulatory Visit: Payer: Self-pay | Admitting: Pharmacist

## 2018-08-14 NOTE — Patient Outreach (Signed)
[facility]) Care Management  08/14/2018   KARRISSA PARCHMENT 06/21/1941 [id number]  Subjective:   77 year old female referred to [place] for medication review due to High Risk review of [employer] beneficiaries. PMHx includes, but not limited to, T2DM, CAD s/p CABG in 2009, HFrEF, Afib w/ hx CVA d/t subtherapeutic INR, CKD, and hx GI bleed.   Patient was contacted today to review medications due to multiple Cross codes (DM, CHF, HTN) and elevated RAF score. HIPAA verifiers identified.   She states that she is in the Medicare Coverage Gap. Patient notes that she has some paperwork for patient assistance from [employer]. She is unsure which prescriber gave this to her. She states that she makes too much money to qualify for Eliquis patient assistance per the Heart Failure clinic.  Diabetes: - She notes that her Lantus has been further reduced to 21 units daily. She reports fasting SMBG results in 100-120s; lowest being 94 in the past few weeks. She denies episodes or s/sx of hypoglycemia.   CHF/HTN: - Patient notes that her fluid status has been well controlled recently. She is not checking BP at home.   Objective:   Current Medications:  Current Outpatient Medications  Medication Sig Dispense Refill  . apixaban (ELIQUIS) 5 MG TABS tablet TAKE ONE (1) TABLET BY MOUTH TWO (2) TIMES DAILY 60 tablet 11  . atorvastatin (LIPITOR) 40 MG tablet TAKE 1 TABLET BY MOUTH  DAILY AT 6 PM. 90 tablet 1  . carvedilol (COREG) 6.25 MG tablet Take 1 tablet (6.25 mg total) by mouth 2 (two) times daily with a meal. 60 tablet 6  . cholecalciferol (VITAMIN D) 1000 UNITS tablet Take 1,000 Units by mouth 2 (two) times daily.    Marland Kitchen docusate sodium (COLACE) 100 MG capsule Take 200 mg by mouth at bedtime.     . ferrous sulfate 325 (65 FE) MG tablet Take 1 tablet (325 mg total) by mouth 2 (two) times daily with a meal. 60 tablet 3  . glucose blood (ONE TOUCH ULTRA TEST) test strip  Use three times a day 100 each 3  . Insulin Glargine (LANTUS SOLOSTAR) 100 UNIT/ML Solostar Pen INJECT 23 UNITS INTO SKIN AT BEDTIME 15 mL 3  . Insulin Pen Needle (PEN NEEDLES) 32G X 4 MM MISC 1 each by Does not apply route at bedtime. Use to inject Lantus each night. 90 each 1  . losartan (COZAAR) 25 MG tablet Take 0.5 tablets (12.5 mg total) by mouth daily. 15 tablet 11  . nitroGLYCERIN (NITROSTAT) 0.4 MG SL tablet Place 0.4 mg under the tongue every 5 (five) minutes as needed for chest pain (up to 3 doses).     . nystatin (MYCOSTATIN/NYSTOP) powder Apply topically 4 (four) times daily. 45 g 0  . rOPINIRole (REQUIP) 0.5 MG tablet TAKE 2 TABLETS BY MOUTH AT  BEDTIME 180 tablet 0  . spironolactone (ALDACTONE) 25 MG tablet Take 1 tablet (25 mg total) by mouth daily. 90 tablet 3  . torsemide (DEMADEX) 20 MG tablet Take 80 mg (4 Tablets) in the AM and 60 mg (3 Tablets) in the PM 215 tablet 3  . traMADol (ULTRAM) 50 MG tablet TAKE 2 TABLETS BY MOUTH EVERY MORNING AND TAKE 1 TABLET AT NIGHT 90 tablet 0  . vitamin B-12 (CYANOCOBALAMIN) 1000 MCG tablet Take 1 tablet (1,000 mcg total) by mouth daily. 90 tablet 0   No current facility-administered medications for this visit.    BMP Latest Ref  Rng & Units 08/06/2018 07/23/2018 06/13/2018  Glucose 70 - 99 mg/dL 90 96 138(H)  BUN 8 - 23 mg/dL 50(H) 52(H) 52(H)  Creatinine 0.44 - 1.00 mg/dL 1.56(H) 1.87(H) 1.73(H)  BUN/Creat Ratio 12 - 28 - - -  Sodium 135 - 145 mmol/L 138 136 139  Potassium 3.5 - 5.1 mmol/L 4.0 5.0 4.3  Chloride 98 - 111 mmol/L 96(L) 99 101  CO2 22 - 32 mmol/L '31 27 29  '$ Calcium 8.9 - 10.3 mg/dL 10.6(H) 10.7(H) 10.3   CrCl ~ 38 mL/min using adjusted body weight; eGFR 30 mL/min/1.1m   Ref. Range 08/05/2018 08:22  HbA1c, POC (controlled diabetic range) Latest Ref Range: 0.0 - 7.0 % 6.1   Lipid Panel     Component Value Date/Time   CHOL 102 03/30/2016 0740   TRIG 50 03/30/2016 0740   HDL 39 (L) 03/30/2016 0740   CHOLHDL 2.6  03/30/2016 0740   VLDL 10 03/30/2016 0740   LDLCALC 53 03/30/2016 0740    Functional Status:  No flowsheet data found.  Fall/Depression Screening: Fall Risk  08/05/2018 07/01/2018 05/14/2018  Falls in the past year? No - No  Risk for fall due to : - Impaired balance/gait;Other (Comment) -  Risk for fall due to: Comment - walks with rolling walker -   PHQ 2/9 Scores 08/05/2018 07/01/2018 05/14/2018 05/05/2018 09/23/2017 09/13/2017 09/05/2017  PHQ - 2 Score 0 0 0 0 0 0 0    Assessment:    Drugs sorted by system:  Neurologic/Psychologic: ropinirole   Cardiovascular: Eliquis, atorvastatin, carvedilol, losartan, spironolactone, torsemide  Endocrine: Lantus  Topical: nystatin  Pain: tramadol  Infectious Diseases:  Vitamins/Minerals/Supplements: cholecalciferol, ferrous sulfate, vitamin B12  Diabetes - Most recent A1c 6.1% well controlled; patient denies s/sx hypoglycemia. As per PCP Dr. WMaudie Flakeslast note, there was a consideration for replacing Lantus with GLP1 therapy for cardioprotective and weight loss benefits. Trulicity and Victoza are Tier 3 preferred on the patient's insurance plan and would be the same copay as Lantus.  - Patient not eligible for initiating SGLT2 therapy d/t renal dysfunction - Discussed financial requirements for Lantus ([employer], 5% of yearly household income OOP) vs Trulicity ([place], $$9030 and Victoza ([employer], $1000). Patient [facility] and notify me when she has a total  Medication Review - Patient states that she doesn't feel like tramadol is adequately controlling her pain. Per chart review, does not look like she has tried topical therapies. Lidocaine 5% patches are Tier 4 on her insurance; diclofenac gel is Tier 3. Could consider trialing OTC lidocaine 4% patches or other OTC topical analgesics  Plan:  - Recommended patient f/u with PCP at appointment next week regarding pain management - Will reach  out to PCP Dr. WShan Levansto discuss if she wants to pursue patient assistance for GLP1 therapy, pending patient's current out of pocket spend.   Catie TDarnelle Maffucci PharmD PGY2 Ambulatory Care Pharmacy Resident, [facility] Phone: [phone]

## 2018-08-15 DIAGNOSIS — R1903 Right lower quadrant abdominal swelling, mass and lump: Secondary | ICD-10-CM | POA: Diagnosis not present

## 2018-08-18 ENCOUNTER — Other Ambulatory Visit: Payer: Self-pay | Admitting: Family Medicine

## 2018-08-18 ENCOUNTER — Encounter: Payer: Self-pay | Admitting: Podiatry

## 2018-08-18 ENCOUNTER — Ambulatory Visit (INDEPENDENT_AMBULATORY_CARE_PROVIDER_SITE_OTHER): Payer: Medicare Other

## 2018-08-18 ENCOUNTER — Ambulatory Visit: Payer: Medicare Other | Admitting: Podiatry

## 2018-08-18 VITALS — BP 130/85 | HR 80 | Resp 16 | Ht 69.0 in | Wt 228.0 lb

## 2018-08-18 DIAGNOSIS — B351 Tinea unguium: Secondary | ICD-10-CM | POA: Diagnosis not present

## 2018-08-18 DIAGNOSIS — M2041 Other hammer toe(s) (acquired), right foot: Secondary | ICD-10-CM

## 2018-08-18 DIAGNOSIS — E1151 Type 2 diabetes mellitus with diabetic peripheral angiopathy without gangrene: Secondary | ICD-10-CM | POA: Diagnosis not present

## 2018-08-18 DIAGNOSIS — M2042 Other hammer toe(s) (acquired), left foot: Secondary | ICD-10-CM

## 2018-08-18 DIAGNOSIS — I872 Venous insufficiency (chronic) (peripheral): Secondary | ICD-10-CM

## 2018-08-18 DIAGNOSIS — M19079 Primary osteoarthritis, unspecified ankle and foot: Secondary | ICD-10-CM

## 2018-08-18 DIAGNOSIS — E119 Type 2 diabetes mellitus without complications: Secondary | ICD-10-CM | POA: Diagnosis not present

## 2018-08-18 DIAGNOSIS — I8390 Asymptomatic varicose veins of unspecified lower extremity: Secondary | ICD-10-CM

## 2018-08-18 DIAGNOSIS — M2011 Hallux valgus (acquired), right foot: Secondary | ICD-10-CM

## 2018-08-18 DIAGNOSIS — M2012 Hallux valgus (acquired), left foot: Secondary | ICD-10-CM

## 2018-08-18 DIAGNOSIS — Z0189 Encounter for other specified special examinations: Secondary | ICD-10-CM

## 2018-08-18 NOTE — Progress Notes (Signed)
Subjective:  Patient ID: [name], female    DOB: 01/19/1941,  MRN: [id number]  Chief Complaint  Patient presents with  . Cyst    L dosal foot x 6-8 mo; no pain -no injury Tx: compression sleeve, and vaseline  . debride    BL nail trimming -fBS: 113 A1C: 6.1 PCP: Wind[place] x 1 wk    77 y.o. female presents  for diabetic foot care. Last AMBS was 113. Denies numbness and tingling in their feet. Denies cramping in legs and thighs.  Review of Systems: Negative except as noted in the HPI. Denies N/V/F/Ch.  Past Medical History:  Diagnosis Date  . ACC/AHA stage C congestive heart failure due to ischemic cardiomyopathy ([place]) 01/31/2009   Qualifier: Diagnosis of  By: Olevia Perches, MD, Glenetta Hew   . ANXIETY 01/31/2009   Qualifier: Diagnosis of  By: Lovette Cliche, CNA, Christy    . Arthritis    "qwhere" (03/30/2016)  . Benign essential HTN   . CAD (coronary artery disease) 05/2008   a. s/p CABG in 2009.  Marland Kitchen Cerebrovascular accident (CVA) due to embolism of right middle cerebral artery ([place])   . Chronic anticoagulation   . Chronic kidney disease (CKD), stage III (moderate) (HCC)   . Chronic lower back pain   . Chronic pain syndrome   . Chronic systolic CHF (congestive heart failure) ([place])   . DM type 2 with diabetic peripheral neuropathy ([place])   . Facial weakness, post-stroke   . Gastrointestinal hemorrhage 03/26/2017  . Hemiparesis ([place])   . History of CVA with residual deficit 03/26/2017  . Hyperlipidemia   . Hypertension   . Ischemic cardiomyopathy   . Longstanding persistent atrial fibrillation    a. Postoperative in 2009;  S/P transesophageal echocardiography-guided cardioversion, previously on Amiodarone and Coumadin. b. recurrent in April 2013 -> pt elected rate control strategy, initially Coumadin -> had stroke with subtherapeutic INR in 03/2016 and transitioned to Eliquis.  . Morbid obesity ([place]) 03/26/2017  . OSA on CPAP    severe OSA with AHI 34/hr  . Persistent atrial  fibrillation   . Thrombocytopenia (HCC)     Current Outpatient Medications:  .  apixaban (ELIQUIS) 5 MG TABS tablet, TAKE ONE (1) TABLET BY MOUTH TWO (2) TIMES DAILY, Disp: 60 tablet, Rfl: 11 .  atorvastatin (LIPITOR) 40 MG tablet, TAKE 1 TABLET BY MOUTH  DAILY AT 6 PM., Disp: 90 tablet, Rfl: 1 .  carvedilol (COREG) 6.25 MG tablet, Take 1 tablet (6.25 mg total) by mouth 2 (two) times daily with a meal., Disp: 60 tablet, Rfl: 6 .  cholecalciferol (VITAMIN D) 1000 UNITS tablet, Take 1,000 Units by mouth 2 (two) times daily., Disp: , Rfl:  .  docusate sodium (COLACE) 100 MG capsule, Take 200 mg by mouth at bedtime. , Disp: , Rfl:  .  ferrous sulfate 325 (65 FE) MG tablet, Take 1 tablet (325 mg total) by mouth 2 (two) times daily with a meal., Disp: 60 tablet, Rfl: 3 .  glucose blood (ONE TOUCH ULTRA TEST) test strip, Use three times a day, Disp: 100 each, Rfl: 3 .  Insulin Glargine (LANTUS SOLOSTAR) 100 UNIT/ML Solostar Pen, INJECT 23 UNITS INTO SKIN AT BEDTIME, Disp: 15 mL, Rfl: 3 .  Insulin Pen Needle (PEN NEEDLES) 32G X 4 MM MISC, 1 each by Does not apply route at bedtime. Use to inject Lantus each night., Disp: 90 each, Rfl: 1 .  losartan (COZAAR) 25 MG tablet, Take 0.5 tablets (12.5 mg total)  by mouth daily., Disp: 15 tablet, Rfl: 11 .  nitroGLYCERIN (NITROSTAT) 0.4 MG SL tablet, Place 0.4 mg under the tongue every 5 (five) minutes as needed for chest pain (up to 3 doses). , Disp: , Rfl:  .  nystatin (MYCOSTATIN/NYSTOP) powder, Apply topically 4 (four) times daily., Disp: 45 g, Rfl: 0 .  rOPINIRole (REQUIP) 0.5 MG tablet, TAKE 2 TABLETS BY MOUTH AT  BEDTIME, Disp: 180 tablet, Rfl: 0 .  spironolactone (ALDACTONE) 25 MG tablet, Take 1 tablet (25 mg total) by mouth daily., Disp: 90 tablet, Rfl: 3 .  torsemide (DEMADEX) 20 MG tablet, Take 80 mg (4 Tablets) in the AM and 60 mg (3 Tablets) in the PM, Disp: 215 tablet, Rfl: 3 .  traMADol (ULTRAM) 50 MG tablet, TAKE 2 TABLETS BY MOUTH EVERY MORNING AND  TAKE 1 TABLET AT NIGHT, Disp: 90 tablet, Rfl: 0 .  vitamin B-12 (CYANOCOBALAMIN) 1000 MCG tablet, Take 1 tablet (1,000 mcg total) by mouth daily., Disp: 90 tablet, Rfl: 0  Social History   Tobacco Use  Smoking Status Never Smoker  Smokeless Tobacco Never Used    Allergies  Allergen Reactions  . Benazepril Other (See Comments)    Unknown reaction at age 62-65 - possibly dizziness  . Angiotensin Receptor Blockers Other (See Comments)    Hypotension reaction  . Fe-Succ-C-Thre-B12-Des Stomach Other (See Comments)    Unknown reaction  . Sulfamethoxazole-Trimethoprim Other (See Comments)    Unknown reaction   Objective:   Vitals:   08/18/18 1459  BP: 130/85  Pulse: 80  Resp: 16   Body mass index is 33.67 kg/m. Constitutional Well developed. Well nourished.  Vascular Dorsalis pedis pulses present 1+ bilaterally  Posterior tibial pulses absent bilaterally  Pedal hair growth diminished. Capillary refill normal to all digits.  No cyanosis or clubbing noted. Varicosities present bilateral  Neurologic Normal speech. Oriented to person, place, and time. Epicritic sensation to light touch grossly present bilaterally. Protective sensation with 5.07 monofilament  present bilaterally. Vibratory sensation present bilaterally.  Dermatologic Nails elongated, thickened, dystrophic. No open wounds. No skin lesions.  Orthopedic: Normal joint ROM without pain or crepitus bilaterally. Hallux abductovalgus deformity bilaterally, lesser digital contractures bilat Dorsal midfoot osteophytes without pain to palpation   Assessment:   1. Arthritis, midfoot   2. Diabetes mellitus type 2 with peripheral artery disease ([place])   3. Onychomycosis   4. Encounter for diabetic foot exam ([name])   5. Varicose veins without complication   6. Venous (peripheral) insufficiency   7. Acquired hallux valgus of both feet   8. Hammer toes of both feet    Plan:  Patient was evaluated and treated and all  questions answered.  Diabetes with PAD, Onychomycosis; HAV deformity with hammertoe formation -Educated on diabetic footcare. Diabetic risk level 1 -Nails x10 debrided sharply and manually with large nail nipper and rotary burr.  -Would benefit from diabetic shoes.  Will make appointment for fabrication.  Procedure: Nail Debridement Rationale: Patient meets criteria for routine foot care due to PAD Type of Debridement: manual, sharp debridement. Instrumentation: Nail nipper, rotary burr. Number of Nails: 10   Midfoot arthritis left -X-rays reviewed consistent with above.  No acute fractures dislocations -Hold off injection therapy at this time as there is no pain to palpation  Venous insufficiency, varicose veins -Would benefit from compression socks.  Return in about 3 months (around 11/18/2018) for Diabetic Foot Care.

## 2018-08-19 ENCOUNTER — Other Ambulatory Visit: Payer: Self-pay | Admitting: Podiatry

## 2018-08-19 DIAGNOSIS — M19079 Primary osteoarthritis, unspecified ankle and foot: Secondary | ICD-10-CM

## 2018-08-19 DIAGNOSIS — M2011 Hallux valgus (acquired), right foot: Secondary | ICD-10-CM

## 2018-08-19 DIAGNOSIS — I872 Venous insufficiency (chronic) (peripheral): Secondary | ICD-10-CM

## 2018-08-19 DIAGNOSIS — M2042 Other hammer toe(s) (acquired), left foot: Secondary | ICD-10-CM

## 2018-08-19 DIAGNOSIS — E119 Type 2 diabetes mellitus without complications: Secondary | ICD-10-CM

## 2018-08-19 DIAGNOSIS — E1151 Type 2 diabetes mellitus with diabetic peripheral angiopathy without gangrene: Secondary | ICD-10-CM

## 2018-08-19 DIAGNOSIS — M2041 Other hammer toe(s) (acquired), right foot: Secondary | ICD-10-CM

## 2018-08-19 DIAGNOSIS — M2012 Hallux valgus (acquired), left foot: Secondary | ICD-10-CM

## 2018-08-19 DIAGNOSIS — I8390 Asymptomatic varicose veins of unspecified lower extremity: Secondary | ICD-10-CM

## 2018-08-20 ENCOUNTER — Other Ambulatory Visit: Payer: Self-pay

## 2018-08-20 ENCOUNTER — Ambulatory Visit (INDEPENDENT_AMBULATORY_CARE_PROVIDER_SITE_OTHER): Payer: Medicare Other | Admitting: Family Medicine

## 2018-08-20 ENCOUNTER — Encounter: Payer: Self-pay | Admitting: Family Medicine

## 2018-08-20 VITALS — BP 112/68 | HR 65 | Temp 98.1°F | Ht 69.0 in | Wt 231.2 lb

## 2018-08-20 DIAGNOSIS — L304 Erythema intertrigo: Secondary | ICD-10-CM | POA: Diagnosis not present

## 2018-08-20 DIAGNOSIS — E1142 Type 2 diabetes mellitus with diabetic polyneuropathy: Secondary | ICD-10-CM | POA: Diagnosis not present

## 2018-08-20 MED ORDER — CLOTRIMAZOLE 1 % EX CREA: 1.0000 "application " | TOPICAL_CREAM | Freq: Two times a day (BID) | CUTANEOUS | 1 refills | Status: DC

## 2018-08-20 MED ORDER — FERROUS SULFATE 325 (65 FE) MG PO TABS: 325.0000 mg | ORAL_TABLET | Freq: Every day | ORAL | 3 refills | Status: DC

## 2018-08-20 MED ORDER — INSULIN GLARGINE 100 UNIT/ML SOLOSTAR PEN: PEN_INJECTOR | SUBCUTANEOUS | 3 refills | Status: DC

## 2018-08-20 NOTE — Patient Instructions (Signed)
It was nice seeing you today [name]!  I'm glad you're doing so well.  Since your diabetes is under such good control, we are decreasing your daily Lantus dose to 17 units.    I sent in a prescription for Clotrimazole cream to your pharmacy, but you can buy it over the counter if it's cheaper that way.  I will fill out your medication assistance application and fax it.  I would like to see you back in about three months to monitor your diabetes.  If you have any questions or concerns, please feel free to call the clinic.   Be well,  Dr. Shan Levans

## 2018-08-20 NOTE — Progress Notes (Signed)
Subjective:    [name] - 77 y.o. female MRN [id number]  Date of birth: 1941-06-12  HPI  [name] is here for follow up of her diabetes and skin inflammation.  She says that she has been doing well overall, and has been cleared from her GI doctor after the CT scan did not show any signs of GI bleeding.  She has also been reduced to 1 ferrous sulfate pill per day, and no longer needs to be Feraheme injections since her last CBC was improved.  She saw the podiatrist, who trimmed her toenails and did an x-ray to look at her foot, which did not need intervention.  She is currently being fitted for diabetic shoes.  Intertrigo - much improved after clotrimazole cream, which was more helpful than the nystatin powder.  Does not burn or irritate her like it did before - Did not need terbinafine cream  Diabetes mellitus - Fasting sugars have been 100-120s -No signs of hypoglycemia -Has been taking 21 units of Lantus each night -Last A1c about 2 weeks ago was 6.1 -Lantus is working well for her, but it is expensive -Would like a patient assistance form filled out and faxed to her insurance    Health Maintenance:  -Has a DEXA scan scheduled for next Friday Health Maintenance Due  Topic Date Due  . TETANUS/TDAP  03/26/1958  . DEXA SCAN  03/26/2004    -  reports that she has never smoked. She has never used smokeless tobacco. - Review of Systems: Per HPI. - Past Medical History: Patient Active Problem List   Diagnosis Date Noted  . Foot deformity 08/05/2018  . Intertrigo 08/05/2018  . Iron deficiency anemia 05/19/2018  . Cloudy urine 04/03/2018  . Dysuria 11/26/2017  . OSA on CPAP   . Hypokalemia 11/20/2017  . Longstanding persistent atrial fibrillation   . Ischemic cardiomyopathy   . Hypertension   . Hemiparesis ([place])   . Facial weakness, post-stroke   . Chronic systolic CHF (congestive heart failure) ([place])   . Chronic lower back pain   . Chronic kidney disease  (CKD), stage III (moderate) (HCC)   . Arthritis   . Venous stasis 09/13/2017  . Open wound of right knee, leg, and ankle with complication 78/67/6720  . Morbid obesity with BMI of 40.0-44.9, adult ([place]) 08/14/2017  . Bilateral leg edema 07/18/2017  . Blood loss anemia 04/09/2017  . Abdominal mass, RLQ (right lower quadrant)   . AKI (acute kidney injury) ([place]) 03/26/2017  . History of CVA with residual deficit 03/26/2017  . Pulmonary hypertension ([place]) 03/26/2017  . Morbid obesity ([place]) 03/26/2017  . Gastrointestinal hemorrhage 03/26/2017  . Rectal bleeding   . Chronic pain syndrome   . Coronary artery disease involving coronary bypass graft of native heart without angina pectoris   . Benign essential HTN   . DM type 2 with diabetic peripheral neuropathy ([place])   . Thrombocytopenia ([place])   . Cerebrovascular accident (CVA) due to embolism of right middle cerebral artery ([place])   . CKD (chronic kidney disease) stage 3, GFR 30-59 ml/min (HCC) 03/30/2016  . Chronic anticoagulation 03/30/2016  . Hyperlipidemia 01/31/2009  . ACC/AHA stage C congestive heart failure due to ischemic cardiomyopathy ([place]) 01/31/2009  . Chronic systolic heart failure ([place]) 01/31/2009  . Atrial fibrillation ([place]) 01/31/2009  . CAD (coronary artery disease) 05/12/2008   - Medications: reviewed and updated   Objective:   Physical Exam BP 112/68   Pulse 65  Temp 98.1 F (36.7 C) (Oral)   Ht 5\' 9"  (1.753 m)   Wt 231 lb 3.2 oz (104.9 kg)   LMP  (LMP Unknown)   SpO2 99%   BMI 34.14 kg/m  Gen: NAD, alert, cooperative with exam, well-appearing, in good spirits HEENT: NCAT, PERRL, clear conjunctiva, oropharynx clear, supple neck CV: RRR, good S1/S2, no murmur, no edema, compression stockings in place Resp: CTABL, no wheezes, non-labored Abd: SNTND, BS present, no guarding or organomegaly Psych: good insight, alert and oriented        Assessment & Plan:   DM type 2 with diabetic peripheral neuropathy  ([place]) Since patient has well-controlled diabetes and Lantus is expensive for her, we will reduce her Lantus dose to 17 units nightly.  This would also decrease the likelihood of hypoglycemia.  We will see her back in 3 months for A1c follow-up.  I will fill out patient's medication assistance form and fax it as soon as possible.  Intertrigo Much improved after clotrimazole cream.  I will send more in so that patient can have this on hand.  Told him they can also get this over-the-counter if that is cheaper.   Maia Breslow, M.D. 08/20/2018, 9:06 AM PGY-2, [place]

## 2018-08-20 NOTE — Assessment & Plan Note (Signed)
Much improved after clotrimazole cream.  I will send more in so that patient can have this on hand.  Told him they can also get this over-the-counter if that is cheaper.

## 2018-08-20 NOTE — Assessment & Plan Note (Addendum)
Since patient has well-controlled diabetes and Lantus is expensive for her, we will reduce her Lantus dose to 17 units nightly.  This would also decrease the likelihood of hypoglycemia.  We will see her back in 3 months for A1c follow-up.  I will fill out patient's medication assistance form and fax it as soon as possible.

## 2018-08-21 ENCOUNTER — Telehealth: Payer: Self-pay | Admitting: *Deleted

## 2018-08-21 NOTE — Telephone Encounter (Signed)
I faxed the forms yesterday (10/9), so they should be in the faxed pile from that day.  Thanks!

## 2018-08-21 NOTE — Telephone Encounter (Signed)
Pt calls, there is some more info that she needs to add to the application.  She would like to pick up the completed paperwork and she will take care of sending it.   I looked in the to be faxed pile and the providers box, but cant find the form.  Will forward message to MD.  \  Pt would like a call when she can pick up the forms. [name], [name], CMA

## 2018-08-22 ENCOUNTER — Ambulatory Visit
Admission: RE | Admit: 2018-08-22 | Discharge: 2018-08-22 | Disposition: A | Discharge status: Home / Self Care | Payer: Medicare Other | Source: Ambulatory Visit | Attending: Family Medicine | Admitting: Family Medicine

## 2018-08-22 DIAGNOSIS — E2839 Other primary ovarian failure: Secondary | ICD-10-CM

## 2018-08-22 DIAGNOSIS — Z780 Asymptomatic menopausal state: Secondary | ICD-10-CM | POA: Diagnosis not present

## 2018-08-22 DIAGNOSIS — Z13820 Encounter for screening for osteoporosis: Secondary | ICD-10-CM | POA: Diagnosis not present

## 2018-08-22 NOTE — Telephone Encounter (Signed)
LMOVM informing pt that the form is at the front desk ready to be picked up.  Copy placed in batch scanning. Triston Skare, [name], CMA

## 2018-08-25 NOTE — Telephone Encounter (Signed)
Patient states that she is unable to come pick up her forms.  Her neighbor Caren Griffins will be coming by to pick them up today.  Jazmin Hartsell,CMA

## 2018-08-26 ENCOUNTER — Other Ambulatory Visit: Payer: Self-pay | Admitting: Pharmacist

## 2018-08-26 NOTE — Patient Outreach (Signed)
[facility]) Care Management  08/26/2018  [name] 09-13-1941 [id number]  Per chart review, it appears patient is working on Patient Assistance applications for Lantus herself, and forms have been completed by the PCP's office.   Will close patient case. Please feel free to contact me if further medication management/medication assistance needs arise in the future.   Catie Darnelle Maffucci, PharmD PGY2 Ambulatory Care Pharmacy Resident, [facility] Phone: [phone]

## 2018-09-01 ENCOUNTER — Other Ambulatory Visit: Payer: Self-pay | Admitting: Pharmacist

## 2018-09-01 NOTE — Patient Outreach (Signed)
[facility]) Care Management  09/01/2018  Johnesha E Martello 1941-05-06 [id number]   Outreach call to [name] regarding her request for follow up from the [facility] Medication Adherence Campaign.  Ms. Biggers reports that she takes her atorvastatin 40 mg once each evening as directed. Denies any missed doses or barriers to adherence. Counsel on the importance of medication adherence. Patient reports that she uses a weekly pillbox to organize her medications.   Note that patient last had her atorvastatin refilled on 07/15/18 for a 90 day supply. Note that patient also spoke with [facility] Resident Catie Darnelle Maffucci on 08/14/18 for medication review.  Patient denies any medication questions/concerns at this time. Will close pharmacy episode.  Harlow Asa, PharmD, [place] Management [phone]

## 2018-09-02 ENCOUNTER — Ambulatory Visit: Payer: Medicare Other

## 2018-09-03 ENCOUNTER — Telehealth: Payer: Self-pay | Admitting: Family Medicine

## 2018-09-03 NOTE — Telephone Encounter (Signed)
LVM to call office back to inform pt of below. [name], [name], CMA          [employer] form for [name]  Received: Today  Message Contents  [name], [name] Drought, MD  [place] application is missing her signature and date on the second page of the application (which is the fourth page of the packet). Please let her know that I will leave it up front for the patient, and she can sign and return it to the front desk, after which I will fax it back to the company. Thanks!

## 2018-09-04 ENCOUNTER — Telehealth: Payer: Self-pay | Admitting: *Deleted

## 2018-09-04 ENCOUNTER — Encounter: Payer: Self-pay | Admitting: Cardiology

## 2018-09-04 ENCOUNTER — Ambulatory Visit (INDEPENDENT_AMBULATORY_CARE_PROVIDER_SITE_OTHER): Payer: Medicare Other | Admitting: Cardiology

## 2018-09-04 VITALS — BP 108/54 | HR 80 | Ht 69.0 in | Wt 227.6 lb

## 2018-09-04 DIAGNOSIS — Z9989 Dependence on other enabling machines and devices: Secondary | ICD-10-CM

## 2018-09-04 DIAGNOSIS — I10 Essential (primary) hypertension: Secondary | ICD-10-CM | POA: Diagnosis not present

## 2018-09-04 DIAGNOSIS — G4733 Obstructive sleep apnea (adult) (pediatric): Secondary | ICD-10-CM | POA: Diagnosis not present

## 2018-09-04 NOTE — Telephone Encounter (Signed)
-----   Message from Sarina Ill, RN sent at 09/04/2018  8:37 AM EDT ----- Hello, Dr. Radford Pax wanted a 3 week download. Thanks, [employer]

## 2018-09-04 NOTE — Progress Notes (Signed)
Cardiology Office Note:    Date:  09/04/2018   ID:  [name], DOB 04/03/1941, MRN [id number]  PCP:  Kathrene Alu, MD  Cardiologist:  Fransico Him, MD    Referring MD: Kathrene Alu, MD   Chief Complaint  Patient presents with  . Sleep Apnea    History of Present Illness:    [name] is a 77 y.o. female with a hx of severe OSA with an AHI of 34/hr.This was ordered due to excessive daytime sleepiness and snoring. She subsequently underwent CPAP titration and is now on CPAP.  She is here today for followup and is doing well.  She has used her CPAP on occasion but has been having problems with her mask.  She uses a nasal pillow mask and is recently been having problems with nosebleeds.  She says that she has another mask which is a full facemask that she got during her sleep study which she is tried a couple times and works much better.  Currently she has not been using her device because of the nosebleeds with a nasal pillow mask.  She says when she did use the CPAP though she definitely could tell a difference in how she felt and she felt much more rested in the morning and was sleeping better at night.  She tolerates the pressure okay.  Past Medical History:  Diagnosis Date  . ACC/AHA stage C congestive heart failure due to ischemic cardiomyopathy ([place]) 01/31/2009   Qualifier: Diagnosis of  By: Olevia Perches, MD, Glenetta Hew   . ANXIETY 01/31/2009   Qualifier: Diagnosis of  By: Lovette Cliche, CNA, Christy    . Arthritis    "qwhere" (03/30/2016)  . Benign essential HTN   . CAD (coronary artery disease) 05/2008   a. s/p CABG in 2009.  Marland Kitchen Cerebrovascular accident (CVA) due to embolism of right middle cerebral artery ([place])   . Chronic anticoagulation   . Chronic kidney disease (CKD), stage III (moderate) (HCC)   . Chronic lower back pain   . Chronic pain syndrome   . Chronic systolic CHF (congestive heart failure) ([place])   . DM type 2 with diabetic peripheral neuropathy  ([place])   . Facial weakness, post-stroke   . Gastrointestinal hemorrhage 03/26/2017  . Hemiparesis ([place])   . History of CVA with residual deficit 03/26/2017  . Hyperlipidemia   . Hypertension   . Ischemic cardiomyopathy   . Longstanding persistent atrial fibrillation    a. Postoperative in 2009;  S/P transesophageal echocardiography-guided cardioversion, previously on Amiodarone and Coumadin. b. recurrent in April 2013 -> pt elected rate control strategy, initially Coumadin -> had stroke with subtherapeutic INR in 03/2016 and transitioned to Eliquis.  . Morbid obesity ([place]) 03/26/2017  . OSA on CPAP    severe OSA with AHI 34/hr  . Persistent atrial fibrillation   . Thrombocytopenia ([place])     Past Surgical History:  Procedure Laterality Date  . APPENDECTOMY  1946  . CATARACT EXTRACTION Right 2012  . COLONOSCOPY WITH PROPOFOL Left 03/27/2017   Procedure: COLONOSCOPY WITH PROPOFOL;  Surgeon: Ronnette Juniper, MD;  Location: [facility];  Service: Gastroenterology;  Laterality: Left;  . CORONARY ANGIOPLASTY WITH STENT PLACEMENT  2009   "put 2 stents in"  . HERNIA REPAIR    . JOINT REPLACEMENT    . TEE WITH CARDIOVERSION     Postoperative in 2009;  S/P transesophageal echocardiography-guided cardioversion,  . TOTAL KNEE ARTHROPLASTY Right 1994  . VENTRAL HERNIA REPAIR  10/1999   With mesh    Current Medications: Current Meds  Medication Sig  . apixaban (ELIQUIS) 5 MG TABS tablet TAKE ONE (1) TABLET BY MOUTH TWO (2) TIMES DAILY  . atorvastatin (LIPITOR) 40 MG tablet TAKE 1 TABLET BY MOUTH  DAILY AT 6 PM.  . carvedilol (COREG) 6.25 MG tablet Take 1 tablet (6.25 mg total) by mouth 2 (two) times daily with a meal.  . cholecalciferol (VITAMIN D) 1000 UNITS tablet Take 1,000 Units by mouth 2 (two) times daily.  . clotrimazole (CLOTRIMAZOLE GRX) 1 % cream Apply 1 application topically 2 (two) times daily.  Marland Kitchen docusate sodium (COLACE) 100 MG capsule Take 200 mg by mouth at bedtime.   . ferrous  sulfate 325 (65 FE) MG tablet Take 1 tablet (325 mg total) by mouth daily with breakfast.  . glucose blood (ONE TOUCH ULTRA TEST) test strip Use three times a day  . Insulin Glargine (LANTUS SOLOSTAR) 100 UNIT/ML Solostar Pen INJECT 17 UNITS INTO SKIN AT BEDTIME  . Insulin Pen Needle (PEN NEEDLES) 32G X 4 MM MISC 1 each by Does not apply route at bedtime. Use to inject Lantus each night.  . losartan (COZAAR) 25 MG tablet Take 0.5 tablets (12.5 mg total) by mouth daily.  . nitroGLYCERIN (NITROSTAT) 0.4 MG SL tablet Place 0.4 mg under the tongue every 5 (five) minutes as needed for chest pain (up to 3 doses).   Marland Kitchen rOPINIRole (REQUIP) 0.5 MG tablet TAKE 2 TABLETS BY MOUTH AT  BEDTIME  . spironolactone (ALDACTONE) 25 MG tablet Take 1 tablet (25 mg total) by mouth daily.  Marland Kitchen torsemide (DEMADEX) 20 MG tablet Take 80 mg (4 Tablets) in the AM and 60 mg (3 Tablets) in the PM  . traMADol (ULTRAM) 50 MG tablet TAKE 2 TABLETS BY MOUTH EVERY MORNING AND TAKE 1 TABLET AT NIGHT  . vitamin B-12 (CYANOCOBALAMIN) 1000 MCG tablet Take 1 tablet (1,000 mcg total) by mouth daily.     Allergies:   Benazepril; Angiotensin receptor blockers; Fe-succ-c-thre-b12-des stomach; and Sulfamethoxazole-trimethoprim   Social History   Socioeconomic History  . Marital status: Widowed    Spouse name: Not on file  . Number of children: Not on file  . Years of education: Not on file  . Highest education level: Not on file  Occupational History  . Not on file  Social Needs  . Financial resource strain: Not on file  . Food insecurity:    Worry: Not on file    Inability: Not on file  . Transportation needs:    Medical: Not on file    Non-medical: Not on file  Tobacco Use  . Smoking status: Never Smoker  . Smokeless tobacco: Never Used  Substance and Sexual Activity  . Alcohol use: No  . Drug use: No  . Sexual activity: Not Currently  Lifestyle  . Physical activity:    Days per week: Not on file    Minutes per  session: Not on file  . Stress: Not on file  Relationships  . Social connections:    Talks on phone: Not on file    Gets together: Not on file    Attends religious service: Not on file    Active member of club or organization: Not on file    Attends meetings of clubs or organizations: Not on file    Relationship status: Not on file  Other Topics Concern  . Not on file  Social History Narrative   Widowed  Lives alone in [place]   2 children   Not routinely exercising     Family History: The patient's family history includes Clotting disorder in her mother; Emphysema in her father; Lung cancer in her brother. There is no history of Heart disease.  ROS:   Please see the history of present illness.    ROS  All other systems reviewed and negative.   EKGs/Labs/Other Studies Reviewed:    The following studies were reviewed today: none  EKG:  EKG is not ordered today.    Recent Labs: 07/23/2018: Hemoglobin 12.9; Platelets 112 08/06/2018: BUN 50; Creatinine, Ser 1.56; Potassium 4.0; Sodium 138   Recent Lipid Panel    Component Value Date/Time   CHOL 102 03/30/2016 0740   TRIG 50 03/30/2016 0740   HDL 39 (L) 03/30/2016 0740   CHOLHDL 2.6 03/30/2016 0740   VLDL 10 03/30/2016 0740   LDLCALC 53 03/30/2016 0740    Physical Exam:    VS:  BP (!) 108/54   Pulse 80   Ht 5\' 9"  (1.753 m)   Wt 227 lb 9.6 oz (103.2 kg)   LMP  (LMP Unknown)   SpO2 95%   BMI 33.61 kg/m     Wt Readings from Last 3 Encounters:  09/04/18 227 lb 9.6 oz (103.2 kg)  08/20/18 231 lb 3.2 oz (104.9 kg)  08/18/18 228 lb (103.4 kg)     GEN:  Well nourished, well developed in no acute distress HEENT: Normal NECK: No JVD; No carotid bruits LYMPHATICS: No lymphadenopathy CARDIAC: RRR, no murmurs, rubs, gallops RESPIRATORY:  Clear to auscultation without rales, wheezing or rhonchi  ABDOMEN: Soft, non-tender, non-distended MUSCULOSKELETAL:  No edema; No deformity  SKIN: Warm and dry NEUROLOGIC:   Alert and oriented x 3 PSYCHIATRIC:  Normal affect   ASSESSMENT:    1. OSA on CPAP   2. Benign essential HTN   3. Morbid obesity ([place])    PLAN:    In order of problems listed above:  1.  OSA -she is currently not using her CPAP device because her nasal pillow mask has been causing her to have nosebleeds.  She does have another mask at home which is a full face which she has tried in the past and said did work quite well for her.  I have recommended that she try using that mask instead.  Since she is tolerated the pressure I would not make any changes there.  I am going get to get a download in 3 weeks to make sure that her apneas are well controlled and she is compliant.  I did review with her at length the need for her to be compliant with her device and using more than 4 hours a night at least 70% of the time so the insurance will continue to pay for her device.  She will let me know if she has problems with the full facemask.  2.  HTN - BP is controlled on exam today.  She will continue on Losartan 25mg  daily and carvedilol 6.25mg  BID.    3.  Morbid obesity - I have encouraged her to get into a routine exercise program and cut back on carbs and portions.    Medication Adjustments/Labs and Tests Ordered: Current medicines are reviewed at length with the patient today.  Concerns regarding medicines are outlined above.  No orders of the defined types were placed in this encounter.  No orders of the defined types were placed in this encounter.  Signed, Fransico Him, MD  09/04/2018 9:14 AM    [place]

## 2018-09-04 NOTE — Patient Instructions (Signed)
Medication Instructions:  Your physician recommends that you continue on your current medications as directed. Please refer to the Current Medication list given to you today.  If you need a refill on your cardiac medications before your next appointment, please call your pharmacy.   Lab work:  If you have labs (blood work) drawn today and your tests are completely normal, you will receive your results only by: Marland Kitchen MyChart Message (if you have MyChart) OR . A paper copy in the mail If you have any lab test that is abnormal or we need to change your treatment, we will call you to review the results.  Follow-Up: Your physician recommends that you schedule a follow-up appointment in: 3 months with Dr. Radford Pax

## 2018-09-15 DIAGNOSIS — N3090 Cystitis, unspecified without hematuria: Secondary | ICD-10-CM | POA: Diagnosis not present

## 2018-09-15 DIAGNOSIS — N3001 Acute cystitis with hematuria: Secondary | ICD-10-CM | POA: Diagnosis not present

## 2018-09-18 ENCOUNTER — Ambulatory Visit: Payer: Medicare Other

## 2018-09-18 ENCOUNTER — Ambulatory Visit
Admission: RE | Admit: 2018-09-18 | Discharge: 2018-09-18 | Disposition: A | Discharge status: Home / Self Care | Payer: Medicare Other | Source: Ambulatory Visit | Attending: Family Medicine | Admitting: Family Medicine

## 2018-09-18 ENCOUNTER — Ambulatory Visit (INDEPENDENT_AMBULATORY_CARE_PROVIDER_SITE_OTHER): Payer: Medicare Other | Admitting: Family Medicine

## 2018-09-18 VITALS — BP 125/65 | HR 64 | Temp 98.0°F | Wt 225.4 lb

## 2018-09-18 DIAGNOSIS — M545 Low back pain, unspecified: Secondary | ICD-10-CM

## 2018-09-18 DIAGNOSIS — M47815 Spondylosis without myelopathy or radiculopathy, thoracolumbar region: Secondary | ICD-10-CM | POA: Diagnosis not present

## 2018-09-18 NOTE — Progress Notes (Signed)
Patient Name: [name] Date of Birth: 1941/08/03 Date of Visit: 09/18/18 PCP: Kathrene Alu, MD  Chief Complaint: persistent urinary symptoms   Subjective: [name] is a pleasant 77 y.o. year old with history significant for atrial fibrillation on Eliquis therapy, heart failure with reduced ejection fraction now at 56 to 50%, ischemic cardiomyopathy, chronic kidney disease, type 2 diabetes and obesity presenting today for follow-up after an urgent care visit.  The patient reports she was recently seen at an urgent care, [facility].  She presented with left-sided low back pain.  At the time she was told she had a very bad urinary tract infection and prescribed ciprofloxacin 500 mg twice daily.  She is been taking this medication.  She has had no improvement in her symptoms.  She reports ongoing low back pain.  This started as a sharp pain along her coccyx.  The pain is now located along her left sacroiliac and lower lumbar spine.  She denies burning with urination, hematuria, dysuria, urinary frequency, nausea or vomiting. Her appetite and fluid intake has been normal and the same as usual.  She is urinating a normal amount.  She does take a diuretic each day.  She has no new rashes.  She denies falls or trauma to the area.  She is tried Tylenol once a day and 1-2 tramadol for the pain.  She is worried the infection is getting worse.    The patient reports a nearly 100 pound intentional weight loss over the last year.  She has eliminated fried foods, sugar sweetened beverages and excessive intake.  Denies fevers, night sweats, unintentional weight loss.  The patient reports her diuretic dose is unchanged.  She continues to have a good amount of urine output each time she takes this.  She denies weight gain, change in her lower extremity edema, chest pain or dyspnea on exertion.  ROS:  ROS Negative as above I have reviewed the patient's medical, surgical, family, and social history  as appropriate.   Vitals:   09/18/18 1014  BP: 125/65  Pulse: 64  Temp: 98 F (36.7 C)  SpO2: 100%   Filed Weights   09/18/18 1014  Weight: 225 lb 6.4 oz (102.2 kg)  HEENT: Sclera anicteric. Dentition is poor . Appears well hydrated. Neck: Supple Asymmetric clavicles JVP is 5 cm Cardiac: Warm and well-perfused Lungs: Clear bilaterally to ascultation.  Abdomen: Normoactive bowel sounds. No tenderness to deep or light palpation. No rebound or guarding.  Extremities: Warm, well perfused without edema.  Tenderness to palpation along left sacroiliac joint.  There is no rash no vesicles no ulcerations along back or anterior abdomen.  She has no anterior abdominal or suprapubic tenderness. Psych: Pleasant and appropriate   I personally called the [facility] urgent care.  The report of the urine culture is mixed urogenital flora.  There is no pathogenic bacteria present in the urine culture that was obtained.  Saman was seen today for uti symptoms.  Diagnoses and all orders for this visit:  Acute left-sided low back pain without sciatica pain this is  most consistent with musculoskeletal etiology.  Differential includes sacroiliac joint dysfunction, coccydynia, less likely compression fracture.  She has no signs or symptoms of a true urinary tract infection.  Her pain is not in the costovertebral angle. There is no rash suggestive of shingles.  As her urine culture prior to antibiotic therapy was negative for any pathogenic bacteria, no treatment is indicated. I have  recommended she discontinue ciprofloxacin.  This medication is adverse side effects and was not renally dosed.  The patient has an upcoming nephrology appointment.  She will go obtain her labs from her nephrologist later today. -     DG Lumbar Spine Complete; Future, given her age, recommended imaging, at risk for compression fracture   Dorris Singh, MD  [facility] Medicine Teaching Service

## 2018-09-18 NOTE — Patient Instructions (Addendum)
It was wonderful to see you today.  Thank you for choosing [facility].   Please call [phone] with any questions about today's appointment.  Please be sure to schedule follow up at the front  desk before you leave today.   Dorris Singh, MD  Family Medicine   Stop your antibiotic

## 2018-09-19 ENCOUNTER — Telehealth: Payer: Self-pay

## 2018-09-19 NOTE — Telephone Encounter (Signed)
Patient left message to speak to Dr Owens Shark to discuss possible pain medication for severe back pain.  Call back is [phone]  Danley Danker, RN [facility])

## 2018-09-19 NOTE — Telephone Encounter (Signed)
Called patient.  Patient reports pain is improving with Tylenol.  Discussed that I would not be prescribing other narcotic medications in addition to tramadol.  Patient's main concern is constipation.  She has tried 5 prunes this morning not yet had a bowel movement.  I recommended using MiraLAX 1 capful twice daily in 8 ounces of water.  Reviewed reasons to call and return to care

## 2018-09-22 ENCOUNTER — Telehealth: Payer: Self-pay

## 2018-09-22 NOTE — Telephone Encounter (Signed)
Called patient regarding questions. She primarily wishes to know the generic name of Miralax. BM relieved pain over weekend. Pain continues to improve, ambulating well. She was visited by a home health nurse who told her she may have a kidney stone. Pain is constant, reproducible over SI joint, non-colicky in nature. No hematuria. This is not consistent with nephrolithiasis. Recommended follow up with PCP.   Front desk- please schedule patient with PCP with next available.

## 2018-09-22 NOTE — Telephone Encounter (Signed)
Patient called in still reporting very bad back pain. Taking Tylenol and Tramadol. Took Miralax Friday, had 2 BM. None since. Had not taken more Miralax until about 1230 this am with no results yet.  Was unable to afford liquid medication advised.  Would like to know what to do.  Call back is [phone]  Danley Danker, RN [facility])

## 2018-09-23 ENCOUNTER — Other Ambulatory Visit: Payer: Self-pay

## 2018-09-23 MED ORDER — TRAMADOL HCL 50 MG PO TABS: ORAL_TABLET | ORAL | 0 refills | Status: DC

## 2018-09-24 ENCOUNTER — Other Ambulatory Visit: Payer: Self-pay

## 2018-09-24 DIAGNOSIS — N183 Chronic kidney disease, stage 3 (moderate): Secondary | ICD-10-CM | POA: Diagnosis not present

## 2018-09-24 DIAGNOSIS — D509 Iron deficiency anemia, unspecified: Secondary | ICD-10-CM | POA: Diagnosis not present

## 2018-09-24 NOTE — Patient Outreach (Signed)
[facility]) Care Management  09/24/2018  [name] 1941-08-19 [id number]   Medication Adherence call to [name] left a message for patient to call back patient is due on Losartan 25 mg under [place].   [facility] Management Direct Dial [phone]  Fax [phone] [name]

## 2018-09-25 DIAGNOSIS — H353221 Exudative age-related macular degeneration, left eye, with active choroidal neovascularization: Secondary | ICD-10-CM | POA: Diagnosis not present

## 2018-09-25 DIAGNOSIS — H3562 Retinal hemorrhage, left eye: Secondary | ICD-10-CM | POA: Diagnosis not present

## 2018-09-26 DIAGNOSIS — N183 Chronic kidney disease, stage 3 (moderate): Secondary | ICD-10-CM | POA: Diagnosis not present

## 2018-09-26 DIAGNOSIS — I5022 Chronic systolic (congestive) heart failure: Secondary | ICD-10-CM | POA: Diagnosis not present

## 2018-09-26 DIAGNOSIS — E1122 Type 2 diabetes mellitus with diabetic chronic kidney disease: Secondary | ICD-10-CM | POA: Diagnosis not present

## 2018-09-26 DIAGNOSIS — N179 Acute kidney failure, unspecified: Secondary | ICD-10-CM | POA: Diagnosis not present

## 2018-09-27 ENCOUNTER — Other Ambulatory Visit: Payer: Self-pay | Admitting: Family Medicine

## 2018-09-29 ENCOUNTER — Telehealth: Payer: Self-pay | Admitting: *Deleted

## 2018-09-29 ENCOUNTER — Ambulatory Visit (HOSPITAL_COMMUNITY)
Admission: RE | Admit: 2018-09-29 | Discharge: 2018-09-29 | Disposition: A | Discharge status: Home / Self Care | Payer: Medicare Other | Source: Ambulatory Visit | Attending: Cardiology | Admitting: Cardiology

## 2018-09-29 ENCOUNTER — Encounter (HOSPITAL_COMMUNITY): Payer: Self-pay | Admitting: Cardiology

## 2018-09-29 VITALS — BP 106/68 | HR 78 | Wt 225.6 lb

## 2018-09-29 DIAGNOSIS — Z79899 Other long term (current) drug therapy: Secondary | ICD-10-CM | POA: Diagnosis not present

## 2018-09-29 DIAGNOSIS — Z6833 Body mass index (BMI) 33.0-33.9, adult: Secondary | ICD-10-CM | POA: Diagnosis not present

## 2018-09-29 DIAGNOSIS — Z8673 Personal history of transient ischemic attack (TIA), and cerebral infarction without residual deficits: Secondary | ICD-10-CM | POA: Insufficient documentation

## 2018-09-29 DIAGNOSIS — I272 Pulmonary hypertension, unspecified: Secondary | ICD-10-CM | POA: Diagnosis not present

## 2018-09-29 DIAGNOSIS — N183 Chronic kidney disease, stage 3 unspecified: Secondary | ICD-10-CM

## 2018-09-29 DIAGNOSIS — Z801 Family history of malignant neoplasm of trachea, bronchus and lung: Secondary | ICD-10-CM | POA: Insufficient documentation

## 2018-09-29 DIAGNOSIS — Z951 Presence of aortocoronary bypass graft: Secondary | ICD-10-CM | POA: Insufficient documentation

## 2018-09-29 DIAGNOSIS — I130 Hypertensive heart and chronic kidney disease with heart failure and stage 1 through stage 4 chronic kidney disease, or unspecified chronic kidney disease: Secondary | ICD-10-CM | POA: Diagnosis not present

## 2018-09-29 DIAGNOSIS — I878 Other specified disorders of veins: Secondary | ICD-10-CM | POA: Insufficient documentation

## 2018-09-29 DIAGNOSIS — I255 Ischemic cardiomyopathy: Secondary | ICD-10-CM | POA: Insufficient documentation

## 2018-09-29 DIAGNOSIS — I482 Chronic atrial fibrillation, unspecified: Secondary | ICD-10-CM | POA: Insufficient documentation

## 2018-09-29 DIAGNOSIS — G4733 Obstructive sleep apnea (adult) (pediatric): Secondary | ICD-10-CM | POA: Diagnosis not present

## 2018-09-29 DIAGNOSIS — I2510 Atherosclerotic heart disease of native coronary artery without angina pectoris: Secondary | ICD-10-CM | POA: Insufficient documentation

## 2018-09-29 DIAGNOSIS — Z882 Allergy status to sulfonamides status: Secondary | ICD-10-CM | POA: Insufficient documentation

## 2018-09-29 DIAGNOSIS — Z825 Family history of asthma and other chronic lower respiratory diseases: Secondary | ICD-10-CM | POA: Diagnosis not present

## 2018-09-29 DIAGNOSIS — Z7901 Long term (current) use of anticoagulants: Secondary | ICD-10-CM | POA: Diagnosis not present

## 2018-09-29 DIAGNOSIS — Z832 Family history of diseases of the blood and blood-forming organs and certain disorders involving the immune mechanism: Secondary | ICD-10-CM | POA: Diagnosis not present

## 2018-09-29 DIAGNOSIS — E1122 Type 2 diabetes mellitus with diabetic chronic kidney disease: Secondary | ICD-10-CM | POA: Insufficient documentation

## 2018-09-29 DIAGNOSIS — Z794 Long term (current) use of insulin: Secondary | ICD-10-CM | POA: Diagnosis not present

## 2018-09-29 DIAGNOSIS — I4819 Other persistent atrial fibrillation: Secondary | ICD-10-CM

## 2018-09-29 DIAGNOSIS — Z888 Allergy status to other drugs, medicaments and biological substances status: Secondary | ICD-10-CM | POA: Diagnosis not present

## 2018-09-29 DIAGNOSIS — I25810 Atherosclerosis of coronary artery bypass graft(s) without angina pectoris: Secondary | ICD-10-CM | POA: Diagnosis not present

## 2018-09-29 DIAGNOSIS — I5022 Chronic systolic (congestive) heart failure: Secondary | ICD-10-CM | POA: Diagnosis not present

## 2018-09-29 LAB — CBC
HCT: 36.4 % (ref 36.0–46.0)
Hemoglobin: 11.4 g/dL — ABNORMAL LOW (ref 12.0–15.0)
MCH: 30.7 pg (ref 26.0–34.0)
MCHC: 31.3 g/dL (ref 30.0–36.0)
MCV: 98.1 fL (ref 80.0–100.0)
NRBC: 0 % (ref 0.0–0.2)
PLATELETS: 217 10*3/uL (ref 150–400)
RBC: 3.71 MIL/uL — ABNORMAL LOW (ref 3.87–5.11)
RDW: 13.5 % (ref 11.5–15.5)
WBC: 8 10*3/uL (ref 4.0–10.5)

## 2018-09-29 LAB — LIPID PANEL
CHOL/HDL RATIO: 2.7 ratio
Cholesterol: 94 mg/dL (ref 0–200)
HDL: 35 mg/dL — ABNORMAL LOW (ref >40)
LDL Cholesterol: 49 mg/dL (ref 0–99)
Triglycerides: 52 mg/dL (ref <150)
VLDL: 10 mg/dL (ref 0–40)

## 2018-09-29 MED ORDER — TORSEMIDE 20 MG PO TABS: ORAL_TABLET | ORAL | 3 refills | Status: DC

## 2018-09-29 NOTE — Telephone Encounter (Signed)
Pt called because she is still having pain in her back.   She wants to know if she can be seen by a back specialist. , [name], [facility]

## 2018-09-29 NOTE — Progress Notes (Signed)
Advanced Heart Failure Clinic Note   Primary Care: Dr. Shan Levans Primary Cardiologist: Dr. Angelena Form HF Cardiology: Dr. Aundra Dubin  HPI:  [name] is a 77 y.o. female with history of CAD status post CABG 2009, ischemic cardiomyopathy with chronic systolic CHF, persistent atrial fibrillation with stroke in 03/2016 when INR was subtherapeutic, stage III CKD, morbid obesity, pulmonary hypertension, diabetes, GI bleed 03/2017, and severe sleep apnea.   Seen in [facility] clinics 09/10/18 and 09/24/18, both times with volume overload and marked peripheral edema with skin breakdown. Torsemide increased at each visit. Echo in 7/19 showed EF 45-50% with mild RV dilation/mild RV systolic dysfunction and D-shaped interventricular septum.    She returned today for followup of CHF.  Now using CPAP.  Weight is down another 11 lbs.  Main complaint now is low back pain, making it hard to work. Tylenol does not help.  No significant exertional dyspnea but not very active.  She has to sit up when sleeping because of back pain.  No chest pain.   Labs (12/18): K 3.9, Creatinine 1.65 Labs (5/19): K 4.1, creatinine 1.7 Labs (6/19): hgb 8.2 Labs (8/19): K 4.3, creatinine 1.73 Labs (9/19): K 4, creatinine 1.56 Labs (11/19): K 4.7, creatinine 2.04  Review of systems complete and found to be negative unless listed in HPI.    Past Medical History 1. Chronic systolic CHF: Ischemic cardiomyopathy with prominent RV failure.   - Echo (11/18): LVEF 35-40%, severe RV dilation, severe RAE, severe TR.  - Echo (7/19): EF 45-50%, diffuse hypokinesis, mild RV dilation with mildly decreased RV systolic function, D-shaped interventricular septum suggestive of RV pressure/volume overload, mild MR, moderate TR, PASP 49 mmhg.  2. CAD: CABG 2009. No angiography since that time.   3. HTN 4. Atrial fibrillation: Chronic since 2013.  5. CKD stage 3 6. Morbid obesity 7. OSA 8. Chronic venous stasis with RLE wound/Bullae 9. CVA 5/18.     Current Outpatient Medications  Medication Sig Dispense Refill  . apixaban (ELIQUIS) 5 MG TABS tablet TAKE ONE (1) TABLET BY MOUTH TWO (2) TIMES DAILY 60 tablet 11  . atorvastatin (LIPITOR) 40 MG tablet TAKE 1 TABLET BY MOUTH  DAILY AT 6 PM. 90 tablet 1  . carvedilol (COREG) 6.25 MG tablet Take 1 tablet (6.25 mg total) by mouth 2 (two) times daily with a meal. 60 tablet 6  . cholecalciferol (VITAMIN D) 1000 UNITS tablet Take 1,000 Units by mouth 2 (two) times daily.    . clotrimazole (CLOTRIMAZOLE GRX) 1 % cream Apply 1 application topically 2 (two) times daily. 45 g 1  . docusate sodium (COLACE) 100 MG capsule Take 200 mg by mouth at bedtime.     . ferrous sulfate 325 (65 FE) MG tablet Take 1 tablet (325 mg total) by mouth daily with breakfast. 60 tablet 3  . glucose blood (ONE TOUCH ULTRA TEST) test strip Use three times a day 100 each 3  . Insulin Glargine (LANTUS SOLOSTAR) 100 UNIT/ML Solostar Pen INJECT 17 UNITS INTO SKIN AT BEDTIME 15 mL 3  . Insulin Pen Needle (PEN NEEDLES) 32G X 4 MM MISC 1 each by Does not apply route at bedtime. Use to inject Lantus each night. 90 each 1  . losartan (COZAAR) 25 MG tablet Take 0.5 tablets (12.5 mg total) by mouth daily. 15 tablet 11  . nitroGLYCERIN (NITROSTAT) 0.4 MG SL tablet Place 0.4 mg under the tongue every 5 (five) minutes as needed for chest pain (up to 3 doses).     Marland Kitchen  rOPINIRole (REQUIP) 0.5 MG tablet TAKE 2 TABLETS BY MOUTH AT  BEDTIME 180 tablet 0  . spironolactone (ALDACTONE) 25 MG tablet Take 1 tablet (25 mg total) by mouth daily. 90 tablet 3  . traMADol (ULTRAM) 50 MG tablet Take 2 tablets PO every morning and take 1 tablet PO at night 90 tablet 0  . vitamin B-12 (CYANOCOBALAMIN) 1000 MCG tablet Take 1 tablet (1,000 mcg total) by mouth daily. 90 tablet 0  . torsemide (DEMADEX) 20 MG tablet TAKE 80 MG (4 TABLETS) IN THE AM AND 60 MG (3 TABLETS) IN THE PM 630 tablet 1  . torsemide (DEMADEX) 20 MG tablet Take 3 tablets (60 mg total) by  mouth every morning AND 2 tablets (40 mg total) every evening. Please cancel all previous orders for current medication. Change in dosage or pill size.. 150 tablet 3   No current facility-administered medications for this encounter.    Allergies  Allergen Reactions  . Benazepril Other (See Comments)    Unknown reaction at age 16-65 - possibly dizziness  . Angiotensin Receptor Blockers Other (See Comments)    Hypotension reaction  . Fe-Succ-C-Thre-B12-Des Stomach Other (See Comments)    Unknown reaction  . Sulfamethoxazole-Trimethoprim Other (See Comments)    Unknown reaction   Social History   Socioeconomic History  . Marital status: Widowed    Spouse name: Not on file  . Number of children: Not on file  . Years of education: Not on file  . Highest education level: Not on file  Occupational History  . Not on file  Social Needs  . Financial resource strain: Not on file  . Food insecurity:    Worry: Not on file    Inability: Not on file  . Transportation needs:    Medical: Not on file    Non-medical: Not on file  Tobacco Use  . Smoking status: Never Smoker  . Smokeless tobacco: Never Used  Substance and Sexual Activity  . Alcohol use: No  . Drug use: No  . Sexual activity: Not Currently  Lifestyle  . Physical activity:    Days per week: Not on file    Minutes per session: Not on file  . Stress: Not on file  Relationships  . Social connections:    Talks on phone: Not on file    Gets together: Not on file    Attends religious service: Not on file    Active member of club or organization: Not on file    Attends meetings of clubs or organizations: Not on file    Relationship status: Not on file  . Intimate partner violence:    Fear of current or ex partner: Not on file    Emotionally abused: Not on file    Physically abused: Not on file    Forced sexual activity: Not on file  Other Topics Concern  . Not on file  Social History Narrative   Widowed   Lives alone  in [place]   2 children   Not routinely exercising   Family History  Problem Relation Age of Onset  . Clotting disorder Mother        Cerebral hemorrhage  . Emphysema Father        COD  . Lung cancer Brother   . Heart disease Neg Hx    Vitals:   09/29/18 0835  BP: 106/68  Pulse: 78  SpO2: 100%  Weight: 102.3 kg (225 lb 9.6 oz)     Wt Readings from  Last 3 Encounters:  09/29/18 102.3 kg (225 lb 9.6 oz)  09/18/18 102.2 kg (225 lb 6.4 oz)  09/04/18 103.2 kg (227 lb 9.6 oz)    PHYSICAL EXAM: General: NAD Neck: No JVD, no thyromegaly or thyroid nodule.  Lungs: Clear to auscultation bilaterally with normal respiratory effort. CV: Nondisplaced PMI.  Heart irregular S1/S2, no S3/S4, no murmur.  No peripheral edema.  No carotid bruit.  Normal pedal pulses.  Abdomen: Soft, nontender, no hepatosplenomegaly, no distention.  Skin: Intact without lesions or rashes.  Neurologic: Alert and oriented x 3.  Psych: Normal affect. Extremities: No clubbing or cyanosis.  HEENT: Normal.   ASSESSMENT & PLAN:  1. Chronic systolic CHF with prominent RV failure: Ischemic cardiomyopathy.  Echo in 7/19 showed LV EF 45-50% (improved) with mildly dilated/mildly dysfunction RV.  D-shaped septum suggested RV pressure/volume overload.  Weight is down another 11 lbs.  On exam today, she does not looks significant volume overloaded.  Creatinine has gone up to 2.   - With rise in creatinine, decrease torsemide to 60 qam/40 qpm with BMET 10 days.  Will need to follow closely on lower dose of torsemide.    - Continue coreg 6.25 mg BID, spironolactone 25 daily, and losartan 12.5 daily. May need to stop losartan if creatinine stays up.  2. CAD sp CABG: No chest pain.  - Continue atorvastatin 40 mg daily.  Check lipids today.  - No ASA with stable CAD on Eliquis.  3. Atrial fibrillation: Chronic. Given long-term atrial fibrillation, she is unlikely to successfully cardiovert.  - Continue Eliquis. CBC today.   4. CKD stage 3: Creatinine up to 2 recently.  - Decreasing torsemide as above.  - BMET today.  5. H/o CVA: Continue Eliquis.   6. OSA: Continue CPAP.   Followup with APP in 3 wks.   Loralie Champagne 09/29/2018

## 2018-09-29 NOTE — Patient Instructions (Addendum)
DECREASE Torsemide 60mg  (3 tabs) in the Morning and 40mg  (2 tabs) in the evening.  Labs today We will only contact you if something comes back abnormal or we need to make some changes. Otherwise no news is good news!  Labs in 10 days.  Your physician recommends that you schedule a follow-up appointment in: 3 weeks with NP/PA

## 2018-09-30 ENCOUNTER — Ambulatory Visit: Payer: Medicare Other | Admitting: Orthotics

## 2018-09-30 ENCOUNTER — Other Ambulatory Visit: Payer: Self-pay | Admitting: Family Medicine

## 2018-09-30 DIAGNOSIS — M545 Low back pain, unspecified: Secondary | ICD-10-CM

## 2018-09-30 DIAGNOSIS — E1151 Type 2 diabetes mellitus with diabetic peripheral angiopathy without gangrene: Secondary | ICD-10-CM

## 2018-09-30 DIAGNOSIS — M19079 Primary osteoarthritis, unspecified ankle and foot: Secondary | ICD-10-CM

## 2018-09-30 DIAGNOSIS — M2011 Hallux valgus (acquired), right foot: Secondary | ICD-10-CM

## 2018-09-30 DIAGNOSIS — M2012 Hallux valgus (acquired), left foot: Secondary | ICD-10-CM

## 2018-09-30 DIAGNOSIS — G8929 Other chronic pain: Secondary | ICD-10-CM

## 2018-09-30 NOTE — Telephone Encounter (Signed)
Pt informed of below. Zimmerman Rumple, April D, CMA  

## 2018-09-30 NOTE — Telephone Encounter (Signed)
I will refer her to sports medicine for further evaluation of her back pain.  According to Dr. Saul Fordyce last note and her recent x-rays, it appears to be musculoskeletal in origin, so she would likely benefit from physical therapy.  I am reticent to refer her to neurosurgery or orthopedics, since back surgery would likely be a bad option for her.

## 2018-10-01 NOTE — Progress Notes (Signed)

## 2018-10-02 ENCOUNTER — Telehealth: Payer: Self-pay

## 2018-10-02 NOTE — Telephone Encounter (Signed)
Dawn, Nurse [facility], called nurse line stating she reached out to patient this morning and pt reported increased pain. Per Arrie Aran, it was hard for patient to talk she was in so much pain. Dawn reported patient is taking Tramadol 100mg  in the morning and 50mg  in the evenings, with tylenol in between, with no relief.   I checked into sports medicine referral- looks like waiting for them to schedule. I called patient to inform of this and she would like something stronger for pain in the meantime.

## 2018-10-03 ENCOUNTER — Other Ambulatory Visit: Payer: Self-pay | Admitting: Family Medicine

## 2018-10-03 MED ORDER — OXYCODONE HCL 5 MG PO TABS: 5.0000 mg | ORAL_TABLET | Freq: Three times a day (TID) | ORAL | 0 refills | Status: DC | PRN

## 2018-10-03 NOTE — Telephone Encounter (Signed)
I am sending in a short course of oxycodone 5 mg (15 tablets every 8 hours as needed to last her 5 days).  Please tell her that she should only take this for breakthrough pain.  She should make an appointment to be assessed for this pain within the week.  At that time, the doctor who assesses her can decide if more pain medication is needed or other options should be pursued.

## 2018-10-03 NOTE — Telephone Encounter (Signed)
Pt informed and appointment scheduled for 10/08/18 on ATC. Katharina Caper,  D, [place]

## 2018-10-06 ENCOUNTER — Inpatient Hospital Stay (HOSPITAL_COMMUNITY)
Admission: EM | Admit: 2018-10-06 | Discharge: 2018-10-14 | DRG: 638 | Disposition: A | Discharge status: Skilled Nursing Facility | Payer: Medicare Other | Attending: Family Medicine | Admitting: Family Medicine

## 2018-10-06 ENCOUNTER — Encounter (HOSPITAL_COMMUNITY): Payer: Self-pay | Admitting: Emergency Medicine

## 2018-10-06 ENCOUNTER — Emergency Department (HOSPITAL_COMMUNITY): Payer: Medicare Other

## 2018-10-06 ENCOUNTER — Inpatient Hospital Stay (HOSPITAL_COMMUNITY): Payer: Medicare Other

## 2018-10-06 DIAGNOSIS — G0491 Myelitis, unspecified: Secondary | ICD-10-CM | POA: Diagnosis not present

## 2018-10-06 DIAGNOSIS — I110 Hypertensive heart disease with heart failure: Secondary | ICD-10-CM | POA: Diagnosis not present

## 2018-10-06 DIAGNOSIS — M4620 Osteomyelitis of vertebra, site unspecified: Secondary | ICD-10-CM | POA: Diagnosis present

## 2018-10-06 DIAGNOSIS — I639 Cerebral infarction, unspecified: Secondary | ICD-10-CM | POA: Diagnosis not present

## 2018-10-06 DIAGNOSIS — Z9989 Dependence on other enabling machines and devices: Secondary | ICD-10-CM | POA: Diagnosis not present

## 2018-10-06 DIAGNOSIS — Z7901 Long term (current) use of anticoagulants: Secondary | ICD-10-CM | POA: Diagnosis not present

## 2018-10-06 DIAGNOSIS — I5032 Chronic diastolic (congestive) heart failure: Secondary | ICD-10-CM | POA: Diagnosis not present

## 2018-10-06 DIAGNOSIS — Z955 Presence of coronary angioplasty implant and graft: Secondary | ICD-10-CM

## 2018-10-06 DIAGNOSIS — M2550 Pain in unspecified joint: Secondary | ICD-10-CM | POA: Diagnosis not present

## 2018-10-06 DIAGNOSIS — D631 Anemia in chronic kidney disease: Secondary | ICD-10-CM | POA: Diagnosis not present

## 2018-10-06 DIAGNOSIS — I2510 Atherosclerotic heart disease of native coronary artery without angina pectoris: Secondary | ICD-10-CM | POA: Diagnosis present

## 2018-10-06 DIAGNOSIS — I878 Other specified disorders of veins: Secondary | ICD-10-CM | POA: Diagnosis not present

## 2018-10-06 DIAGNOSIS — I362 Nonrheumatic tricuspid (valve) stenosis with insufficiency: Secondary | ICD-10-CM | POA: Diagnosis not present

## 2018-10-06 DIAGNOSIS — M4646 Discitis, unspecified, lumbar region: Secondary | ICD-10-CM

## 2018-10-06 DIAGNOSIS — G8929 Other chronic pain: Secondary | ICD-10-CM | POA: Diagnosis not present

## 2018-10-06 DIAGNOSIS — Z8673 Personal history of transient ischemic attack (TIA), and cerebral infarction without residual deficits: Secondary | ICD-10-CM

## 2018-10-06 DIAGNOSIS — N183 Chronic kidney disease, stage 3 (moderate): Secondary | ICD-10-CM | POA: Diagnosis not present

## 2018-10-06 DIAGNOSIS — Z6834 Body mass index (BMI) 34.0-34.9, adult: Secondary | ICD-10-CM

## 2018-10-06 DIAGNOSIS — Z801 Family history of malignant neoplasm of trachea, bronchus and lung: Secondary | ICD-10-CM

## 2018-10-06 DIAGNOSIS — I959 Hypotension, unspecified: Secondary | ICD-10-CM | POA: Diagnosis not present

## 2018-10-06 DIAGNOSIS — N179 Acute kidney failure, unspecified: Secondary | ICD-10-CM

## 2018-10-06 DIAGNOSIS — I255 Ischemic cardiomyopathy: Secondary | ICD-10-CM | POA: Diagnosis present

## 2018-10-06 DIAGNOSIS — I5022 Chronic systolic (congestive) heart failure: Secondary | ICD-10-CM | POA: Diagnosis not present

## 2018-10-06 DIAGNOSIS — I4819 Other persistent atrial fibrillation: Secondary | ICD-10-CM | POA: Diagnosis not present

## 2018-10-06 DIAGNOSIS — R509 Fever, unspecified: Secondary | ICD-10-CM | POA: Diagnosis not present

## 2018-10-06 DIAGNOSIS — Z951 Presence of aortocoronary bypass graft: Secondary | ICD-10-CM

## 2018-10-06 DIAGNOSIS — Z96651 Presence of right artificial knee joint: Secondary | ICD-10-CM | POA: Diagnosis not present

## 2018-10-06 DIAGNOSIS — E1122 Type 2 diabetes mellitus with diabetic chronic kidney disease: Secondary | ICD-10-CM | POA: Diagnosis present

## 2018-10-06 DIAGNOSIS — E876 Hypokalemia: Secondary | ICD-10-CM | POA: Diagnosis not present

## 2018-10-06 DIAGNOSIS — E119 Type 2 diabetes mellitus without complications: Secondary | ICD-10-CM | POA: Diagnosis not present

## 2018-10-06 DIAGNOSIS — G2581 Restless legs syndrome: Secondary | ICD-10-CM | POA: Diagnosis not present

## 2018-10-06 DIAGNOSIS — I509 Heart failure, unspecified: Secondary | ICD-10-CM | POA: Diagnosis not present

## 2018-10-06 DIAGNOSIS — Z7401 Bed confinement status: Secondary | ICD-10-CM | POA: Diagnosis not present

## 2018-10-06 DIAGNOSIS — A429 Actinomycosis, unspecified: Secondary | ICD-10-CM

## 2018-10-06 DIAGNOSIS — M545 Low back pain: Secondary | ICD-10-CM | POA: Diagnosis not present

## 2018-10-06 DIAGNOSIS — L89152 Pressure ulcer of sacral region, stage 2: Secondary | ICD-10-CM

## 2018-10-06 DIAGNOSIS — R079 Chest pain, unspecified: Secondary | ICD-10-CM | POA: Diagnosis not present

## 2018-10-06 DIAGNOSIS — E1142 Type 2 diabetes mellitus with diabetic polyneuropathy: Secondary | ICD-10-CM

## 2018-10-06 DIAGNOSIS — A4289 Other forms of actinomycosis: Secondary | ICD-10-CM | POA: Diagnosis not present

## 2018-10-06 DIAGNOSIS — I4891 Unspecified atrial fibrillation: Secondary | ICD-10-CM | POA: Diagnosis not present

## 2018-10-06 DIAGNOSIS — K6289 Other specified diseases of anus and rectum: Secondary | ICD-10-CM | POA: Diagnosis not present

## 2018-10-06 DIAGNOSIS — E1169 Type 2 diabetes mellitus with other specified complication: Secondary | ICD-10-CM | POA: Diagnosis not present

## 2018-10-06 DIAGNOSIS — I25810 Atherosclerosis of coronary artery bypass graft(s) without angina pectoris: Secondary | ICD-10-CM | POA: Diagnosis not present

## 2018-10-06 DIAGNOSIS — E785 Hyperlipidemia, unspecified: Secondary | ICD-10-CM | POA: Diagnosis present

## 2018-10-06 DIAGNOSIS — M549 Dorsalgia, unspecified: Secondary | ICD-10-CM | POA: Diagnosis not present

## 2018-10-06 DIAGNOSIS — Z888 Allergy status to other drugs, medicaments and biological substances status: Secondary | ICD-10-CM | POA: Diagnosis not present

## 2018-10-06 DIAGNOSIS — I130 Hypertensive heart and chronic kidney disease with heart failure and stage 1 through stage 4 chronic kidney disease, or unspecified chronic kidney disease: Secondary | ICD-10-CM | POA: Diagnosis present

## 2018-10-06 DIAGNOSIS — Z825 Family history of asthma and other chronic lower respiratory diseases: Secondary | ICD-10-CM

## 2018-10-06 DIAGNOSIS — I50814 Right heart failure due to left heart failure: Secondary | ICD-10-CM | POA: Diagnosis not present

## 2018-10-06 DIAGNOSIS — M4640 Discitis, unspecified, site unspecified: Secondary | ICD-10-CM

## 2018-10-06 DIAGNOSIS — M869 Osteomyelitis, unspecified: Secondary | ICD-10-CM | POA: Diagnosis not present

## 2018-10-06 DIAGNOSIS — E118 Type 2 diabetes mellitus with unspecified complications: Secondary | ICD-10-CM | POA: Diagnosis not present

## 2018-10-06 DIAGNOSIS — I10 Essential (primary) hypertension: Secondary | ICD-10-CM | POA: Diagnosis not present

## 2018-10-06 DIAGNOSIS — Z743 Need for continuous supervision: Secondary | ICD-10-CM | POA: Diagnosis not present

## 2018-10-06 DIAGNOSIS — M5489 Other dorsalgia: Secondary | ICD-10-CM | POA: Diagnosis not present

## 2018-10-06 DIAGNOSIS — L8990 Pressure ulcer of unspecified site, unspecified stage: Secondary | ICD-10-CM | POA: Diagnosis not present

## 2018-10-06 DIAGNOSIS — R197 Diarrhea, unspecified: Secondary | ICD-10-CM | POA: Diagnosis not present

## 2018-10-06 DIAGNOSIS — Z881 Allergy status to other antibiotic agents status: Secondary | ICD-10-CM | POA: Diagnosis not present

## 2018-10-06 DIAGNOSIS — Z832 Family history of diseases of the blood and blood-forming organs and certain disorders involving the immune mechanism: Secondary | ICD-10-CM

## 2018-10-06 DIAGNOSIS — Z794 Long term (current) use of insulin: Secondary | ICD-10-CM

## 2018-10-06 DIAGNOSIS — G4733 Obstructive sleep apnea (adult) (pediatric): Secondary | ICD-10-CM | POA: Diagnosis not present

## 2018-10-06 DIAGNOSIS — Z95828 Presence of other vascular implants and grafts: Secondary | ICD-10-CM | POA: Diagnosis not present

## 2018-10-06 DIAGNOSIS — M4626 Osteomyelitis of vertebra, lumbar region: Secondary | ICD-10-CM | POA: Diagnosis not present

## 2018-10-06 DIAGNOSIS — I4811 Longstanding persistent atrial fibrillation: Secondary | ICD-10-CM | POA: Diagnosis present

## 2018-10-06 DIAGNOSIS — R609 Edema, unspecified: Secondary | ICD-10-CM | POA: Diagnosis not present

## 2018-10-06 DIAGNOSIS — R531 Weakness: Secondary | ICD-10-CM | POA: Diagnosis not present

## 2018-10-06 DIAGNOSIS — R5381 Other malaise: Secondary | ICD-10-CM | POA: Diagnosis not present

## 2018-10-06 LAB — CBC WITH DIFFERENTIAL/PLATELET
Abs Immature Granulocytes: 0.03 10*3/uL (ref 0.00–0.07)
Basophils Absolute: 0 10*3/uL (ref 0.0–0.1)
Basophils Relative: 0 %
Eosinophils Absolute: 0.1 10*3/uL (ref 0.0–0.5)
Eosinophils Relative: 1 %
HEMATOCRIT: 33 % — AB (ref 36.0–46.0)
HEMOGLOBIN: 10.3 g/dL — AB (ref 12.0–15.0)
Immature Granulocytes: 0 %
LYMPHS ABS: 1 10*3/uL (ref 0.7–4.0)
LYMPHS PCT: 13 %
MCH: 30.2 pg (ref 26.0–34.0)
MCHC: 31.2 g/dL (ref 30.0–36.0)
MCV: 96.8 fL (ref 80.0–100.0)
MONO ABS: 0.9 10*3/uL (ref 0.1–1.0)
Monocytes Relative: 13 %
Neutro Abs: 5.5 10*3/uL (ref 1.7–7.7)
Neutrophils Relative %: 73 %
Platelets: 169 10*3/uL (ref 150–400)
RBC: 3.41 MIL/uL — ABNORMAL LOW (ref 3.87–5.11)
RDW: 13.2 % (ref 11.5–15.5)
WBC: 7.5 10*3/uL (ref 4.0–10.5)
nRBC: 0 % (ref 0.0–0.2)

## 2018-10-06 LAB — BASIC METABOLIC PANEL
Anion gap: 7 (ref 5–15)
BUN: 103 mg/dL — AB (ref 8–23)
CHLORIDE: 99 mmol/L (ref 98–111)
CO2: 29 mmol/L (ref 22–32)
CREATININE: 2.41 mg/dL — AB (ref 0.44–1.00)
Calcium: 10 mg/dL (ref 8.9–10.3)
GFR calc Af Amer: 21 mL/min — ABNORMAL LOW (ref >60)
GFR calc non Af Amer: 18 mL/min — ABNORMAL LOW (ref >60)
Glucose, Bld: 151 mg/dL — ABNORMAL HIGH (ref 70–99)
Potassium: 4.7 mmol/L (ref 3.5–5.1)
Sodium: 135 mmol/L (ref 135–145)

## 2018-10-06 LAB — GLUCOSE, CAPILLARY
Glucose-Capillary: 114 mg/dL — ABNORMAL HIGH (ref 70–99)
Glucose-Capillary: 171 mg/dL — ABNORMAL HIGH (ref 70–99)
Glucose-Capillary: 215 mg/dL — ABNORMAL HIGH (ref 70–99)

## 2018-10-06 LAB — SEDIMENTATION RATE: Sed Rate: 80 mm/hr — ABNORMAL HIGH (ref 0–22)

## 2018-10-06 MED ORDER — OXYCODONE HCL 5 MG PO TABS: 5.0000 mg | ORAL_TABLET | ORAL | Status: DC | PRN

## 2018-10-06 MED ORDER — TORSEMIDE 20 MG PO TABS: 40.0000 mg | ORAL_TABLET | Freq: Every evening | ORAL | Status: DC

## 2018-10-06 MED ORDER — VITAMIN B-12 1000 MCG PO TABS
1000.0000 ug | ORAL_TABLET | Freq: Every day | ORAL | Status: DC
  Administered 2018-10-06 – 2018-10-14 (×9): 1000 ug via ORAL
  Filled 2018-10-06 (×9): qty 1

## 2018-10-06 MED ORDER — FERROUS SULFATE 325 (65 FE) MG PO TABS
325.0000 mg | ORAL_TABLET | Freq: Every day | ORAL | Status: DC
  Administered 2018-10-06 – 2018-10-08 (×2): 325 mg via ORAL
  Filled 2018-10-06 (×2): qty 1

## 2018-10-06 MED ORDER — MORPHINE SULFATE (PF) 2 MG/ML IV SOLN: 1.0000 mg | INTRAVENOUS | Status: DC | PRN

## 2018-10-06 MED ORDER — INSULIN GLARGINE 100 UNIT/ML ~~LOC~~ SOLN
8.0000 [IU] | Freq: Every day | SUBCUTANEOUS | Status: DC
  Administered 2018-10-06 – 2018-10-13 (×8): 8 [IU] via SUBCUTANEOUS
  Filled 2018-10-06 (×9): qty 0.08

## 2018-10-06 MED ORDER — VANCOMYCIN HCL IN DEXTROSE 1-5 GM/200ML-% IV SOLN
1000.0000 mg | Freq: Once | INTRAVENOUS | Status: AC
  Administered 2018-10-06: 1000 mg via INTRAVENOUS
  Filled 2018-10-06: qty 200

## 2018-10-06 MED ORDER — ROPINIROLE HCL 1 MG PO TABS
1.0000 mg | ORAL_TABLET | Freq: Every day | ORAL | Status: DC
  Administered 2018-10-06 – 2018-10-13 (×8): 1 mg via ORAL
  Filled 2018-10-06 (×8): qty 1

## 2018-10-06 MED ORDER — POLYETHYLENE GLYCOL 3350 17 G PO PACK
17.0000 g | PACK | Freq: Every day | ORAL | Status: DC
  Administered 2018-10-06 – 2018-10-10 (×2): 17 g via ORAL
  Filled 2018-10-06 (×5): qty 1

## 2018-10-06 MED ORDER — LORAZEPAM 2 MG/ML IJ SOLN
0.5000 mg | Freq: Once | INTRAMUSCULAR | Status: AC
  Administered 2018-10-06: 0.5 mg via INTRAVENOUS
  Filled 2018-10-06: qty 1

## 2018-10-06 MED ORDER — CARVEDILOL 6.25 MG PO TABS
6.2500 mg | ORAL_TABLET | Freq: Two times a day (BID) | ORAL | Status: DC
  Administered 2018-10-06 – 2018-10-08 (×4): 6.25 mg via ORAL
  Filled 2018-10-06 (×5): qty 1

## 2018-10-06 MED ORDER — METHOCARBAMOL 1000 MG/10ML IJ SOLN
1000.0000 mg | Freq: Once | INTRAVENOUS | Status: AC
  Administered 2018-10-06: 1000 mg via INTRAVENOUS
  Filled 2018-10-06: qty 10

## 2018-10-06 MED ORDER — ATORVASTATIN CALCIUM 40 MG PO TABS
40.0000 mg | ORAL_TABLET | Freq: Every day | ORAL | Status: DC
  Administered 2018-10-06 – 2018-10-13 (×8): 40 mg via ORAL
  Filled 2018-10-06 (×8): qty 1

## 2018-10-06 MED ORDER — ACETAMINOPHEN 650 MG RE SUPP: 650.0000 mg | Freq: Four times a day (QID) | RECTAL | Status: DC | PRN

## 2018-10-06 MED ORDER — INSULIN ASPART 100 UNIT/ML ~~LOC~~ SOLN
0.0000 [IU] | Freq: Three times a day (TID) | SUBCUTANEOUS | Status: DC
  Administered 2018-10-06: 2 [IU] via SUBCUTANEOUS
  Administered 2018-10-07: 1 [IU] via SUBCUTANEOUS
  Administered 2018-10-07: 3 [IU] via SUBCUTANEOUS
  Administered 2018-10-07: 1 [IU] via SUBCUTANEOUS
  Administered 2018-10-08 (×2): 2 [IU] via SUBCUTANEOUS
  Administered 2018-10-08: 1 [IU] via SUBCUTANEOUS
  Administered 2018-10-09: 3 [IU] via SUBCUTANEOUS
  Administered 2018-10-09: 2 [IU] via SUBCUTANEOUS
  Administered 2018-10-09: 5 [IU] via SUBCUTANEOUS
  Administered 2018-10-10 (×3): 2 [IU] via SUBCUTANEOUS
  Administered 2018-10-11: 3 [IU] via SUBCUTANEOUS
  Administered 2018-10-11: 2 [IU] via SUBCUTANEOUS
  Administered 2018-10-11: 1 [IU] via SUBCUTANEOUS
  Administered 2018-10-12: 2 [IU] via SUBCUTANEOUS
  Administered 2018-10-12: 1 [IU] via SUBCUTANEOUS
  Administered 2018-10-12: 2 [IU] via SUBCUTANEOUS
  Administered 2018-10-13: 1 [IU] via SUBCUTANEOUS
  Administered 2018-10-13 (×2): 2 [IU] via SUBCUTANEOUS
  Administered 2018-10-14: 3 [IU] via SUBCUTANEOUS
  Administered 2018-10-14: 1 [IU] via SUBCUTANEOUS

## 2018-10-06 MED ORDER — HYDROMORPHONE HCL 1 MG/ML IJ SOLN
0.5000 mg | Freq: Once | INTRAMUSCULAR | Status: AC
  Administered 2018-10-06: 0.5 mg via INTRAVENOUS
  Filled 2018-10-06: qty 1

## 2018-10-06 MED ORDER — LOSARTAN POTASSIUM 25 MG PO TABS: 12.5000 mg | ORAL_TABLET | Freq: Every day | ORAL | Status: DC

## 2018-10-06 MED ORDER — TORSEMIDE 20 MG PO TABS: 60.0000 mg | ORAL_TABLET | ORAL | Status: DC

## 2018-10-06 MED ORDER — PROMETHAZINE HCL 25 MG PO TABS: 12.5000 mg | ORAL_TABLET | Freq: Four times a day (QID) | ORAL | Status: DC | PRN

## 2018-10-06 MED ORDER — ACETAMINOPHEN 325 MG PO TABS
650.0000 mg | ORAL_TABLET | Freq: Four times a day (QID) | ORAL | Status: DC | PRN
  Administered 2018-10-08 – 2018-10-14 (×7): 650 mg via ORAL
  Filled 2018-10-06 (×7): qty 2

## 2018-10-06 MED ORDER — ONDANSETRON HCL 4 MG/2ML IJ SOLN
4.0000 mg | Freq: Once | INTRAMUSCULAR | Status: AC
  Administered 2018-10-06: 4 mg via INTRAVENOUS
  Filled 2018-10-06: qty 2

## 2018-10-06 MED ORDER — VITAMIN D 25 MCG (1000 UNIT) PO TABS
1000.0000 [IU] | ORAL_TABLET | Freq: Two times a day (BID) | ORAL | Status: DC
  Administered 2018-10-06 – 2018-10-14 (×16): 1000 [IU] via ORAL
  Filled 2018-10-06 (×16): qty 1

## 2018-10-06 MED ORDER — SPIRONOLACTONE 25 MG PO TABS: 25.0000 mg | ORAL_TABLET | Freq: Every day | ORAL | Status: DC

## 2018-10-06 MED ORDER — DOCUSATE SODIUM 100 MG PO CAPS
200.0000 mg | ORAL_CAPSULE | Freq: Every day | ORAL | Status: DC
  Administered 2018-10-06 – 2018-10-10 (×4): 200 mg via ORAL
  Filled 2018-10-06 (×6): qty 2

## 2018-10-06 NOTE — ED Triage Notes (Signed)
Pt here from home with c/o back pain times 4 weeks , pt states that she got up this morning and could not "get around" pt has appointment to see Ortho this WED

## 2018-10-06 NOTE — ED Notes (Signed)
Pt moved to another room- purwick hooked up. Brooke given report

## 2018-10-06 NOTE — Consult Note (Signed)
[facility] for Infectious Disease    Date of Admission:  10/06/2018   Total days of antibiotics 0        1 dose Vancomycin                Reason for Consult: Discitis     Referring Provider: Owens Shark  Primary Care Provider: Kathrene Alu, MD   Assessment: 77 y.o. diabetic female with 4week history of worsening and now severe back pain. MRI findings c/w discitis and paravertebral inflammation. Blood cultures have been drawn prior to any antibiotics and pending. No hx intravenous injections, back injections, soft tissue wounds or illnesses that would explain hematogenous seeding of Lspine.  Plan will be that since she is stable to continue to hold antibiotics until we can arrange IR aspirate of space. Would also consider neurosurgery evaluation to ensure no need for surgery - without neurologic compromise or abscess likely she will improve/requrie prolonged antibiotics alone. Discussed this with her briefly today. We welcomed and answered all questions. Her R TKR looks benign.   Plan: 1. Hold all antibiotics  2. Consider neurosurgery eval  3. IR evaluation for lumbar aspirate to ID pathogen  4. CRP/ESR if not done for tx baseline.  5. Check Hep C for lifetime recommended maintenance  6. Case management consult for home health/abx needs 7. Hold off on PICC until blood cultures have time to mature  Active Problems:   Osteomyelitis ([place])   . atorvastatin  40 mg Oral q1800  . carvedilol  6.25 mg Oral BID WC  . cholecalciferol  1,000 Units Oral BID  . docusate sodium  200 mg Oral QHS  . [START ON 10/07/2018] ferrous sulfate  325 mg Oral Q breakfast  . insulin aspart  0-9 Units Subcutaneous TID WC  . insulin glargine  8 Units Subcutaneous QHS  . polyethylene glycol  17 g Oral Daily  . rOPINIRole  1 mg Oral QHS  . vitamin B-12  1,000 mcg Oral Daily    HPI: [name] is a 77 y.o. female admitted 10/06/2018 with PMHx is significant for T2DM, HFrEF (35-40%), CKD3,  HTN, HLD, OSA on CPAP, afib on Eliquis, h/o CVA 2017, CAD s/p CABG 2009, morbid obesity.    She presented with worsening back pain over the last 3 weeks. Went to [facility] earlier this month for UTI (despite urinary symptoms and positive culture). She was told to stop cipro at that time and further back x-rays were assessed >> arthritis changes. Reports pain started at her tailbone then moved to R lower back, then L lower back, and is now all across her lower back. Pain is sharp, stabbing and comes and goes with movement. Pain does not radiate down her legs. No bowel incontinence. Denies rashes, fevers, trauma, recent falls. She received a dose of vancomycin at 0800 this AM after blood cultures were drawn. She was admitted 11/24 for MRI findings of lumbar discitis/osteomyelitis L2-3 space with right paraspinal soft tissue inflammation amongst chronic ankylosis and foraminal stenosis findings. She denies any soft tissue wounds, cuts or abrasions she recalls prior to this pain. She has a R TKR done in 1994 that has not given her any trouble. No previous illnesses or hospitalizations prior to onset. She has not been febrile.   Review of Systems  Constitutional: Negative for chills and fever.  HENT: Negative for tinnitus.   Eyes: Negative for blurred vision and photophobia.  Respiratory: Negative  for cough and sputum production.   Cardiovascular: Negative for chest pain.  Gastrointestinal: Negative for diarrhea, nausea and vomiting.  Genitourinary: Negative for dysuria.  Musculoskeletal: Positive for back pain.  Skin: Negative for rash.  Neurological: Negative for headaches.    Past Medical History:  Diagnosis Date  . ACC/AHA stage C congestive heart failure due to ischemic cardiomyopathy ([place]) 01/31/2009   Qualifier: Diagnosis of  By: Olevia Perches, MD, Glenetta Hew   . ANXIETY 01/31/2009   Qualifier: Diagnosis of  By: Lovette Cliche, CNA, Christy    . Arthritis    "qwhere" (03/30/2016)  . Benign  essential HTN   . CAD (coronary artery disease) 05/2008   a. s/p CABG in 2009.  Marland Kitchen Cerebrovascular accident (CVA) due to embolism of right middle cerebral artery ([place])   . Chronic anticoagulation   . Chronic kidney disease (CKD), stage III (moderate) ([facility])   . Chronic lower back pain   . Chronic pain syndrome   . Chronic systolic CHF (congestive heart failure) ([place])   . DM type 2 with diabetic peripheral neuropathy ([place])   . Facial weakness, post-stroke   . Gastrointestinal hemorrhage 03/26/2017  . Hemiparesis ([place])   . History of CVA with residual deficit 03/26/2017  . Hyperlipidemia   . Hypertension   . Ischemic cardiomyopathy   . Longstanding persistent atrial fibrillation    a. Postoperative in 2009;  S/P transesophageal echocardiography-guided cardioversion, previously on Amiodarone and Coumadin. b. recurrent in April 2013 -> pt elected rate control strategy, initially Coumadin -> had stroke with subtherapeutic INR in 03/2016 and transitioned to Eliquis.  . Morbid obesity ([place]) 03/26/2017  . OSA on CPAP    severe OSA with AHI 34/hr  . Persistent atrial fibrillation   . Thrombocytopenia ([facility])     Social History   Tobacco Use  . Smoking status: Never Smoker  . Smokeless tobacco: Never Used  Substance Use Topics  . Alcohol use: No  . Drug use: No    Family History  Problem Relation Age of Onset  . Clotting disorder Mother        Cerebral hemorrhage  . Emphysema Father        COD  . Lung cancer Brother   . Heart disease Neg Hx    Allergies  Allergen Reactions  . Benazepril Other (See Comments)    Unknown reaction at age 2-65 - possibly dizziness  . Angiotensin Receptor Blockers Other (See Comments)    Hypotension reaction  . Fe-Succ-C-Thre-B12-Des Stomach Other (See Comments)    Unknown reaction  . Sulfamethoxazole-Trimethoprim Other (See Comments)    Unknown reaction    OBJECTIVE: Blood pressure (!) 97/43, pulse (!) 56, temperature 99.4 F (37.4 C),  temperature source Oral, resp. rate 16, SpO2 98 %.  Physical Exam  Constitutional: She is oriented to person, place, and time. She appears well-developed and well-nourished.  Seated comfortably in chair.   HENT:  Mouth/Throat: Mucous membranes are normal. No oral lesions. Normal dentition. No dental abscesses. No oropharyngeal exudate.  Cardiovascular: Normal rate and normal heart sounds. An irregularly irregular rhythm present.  Pulmonary/Chest: Effort normal and breath sounds normal.  Abdominal: Soft. She exhibits no distension. There is no tenderness.  Last [facility] Wednesday. No distension or tenderness.   Musculoskeletal: She exhibits no edema.  R TKR incision well healed. No Pain, erythema warmth or tenderness.  Lower back pain midline and bilateral. Tenderness improved.   Neurological: She is alert and oriented to person, place, and time.  Skin: Skin  is warm and dry. No rash noted.  Psychiatric: She has a normal mood and affect. Judgment normal.  In good spirits today and engaged in care discussion  Vitals reviewed.   Lab Results Lab Results  Component Value Date   WBC 7.5 10/06/2018   HGB 10.3 (L) 10/06/2018   HCT 33.0 (L) 10/06/2018   MCV 96.8 10/06/2018   PLT 169 10/06/2018    Lab Results  Component Value Date   CREATININE 2.41 (H) 10/06/2018   BUN 103 (H) 10/06/2018   NA 135 10/06/2018   K 4.7 10/06/2018   CL 99 10/06/2018   CO2 29 10/06/2018    Lab Results  Component Value Date   ALT 14 03/25/2017   AST 27 03/25/2017   ALKPHOS 99 03/25/2017   BILITOT 0.8 03/25/2017    Sed Rate (mm/hr)  Date Value  10/06/2018 80 (H)    Microbiology: No results found for this or any previous visit (from the past 240 hour(s)).  Janene Madeira, MSN, NP-C [facility] for Infectious [place] [address] Cell: [phone] Pager: [phone]  10/06/2018 2:42 PM

## 2018-10-06 NOTE — H&P (Addendum)
[facility] Admission History and Physical Service Pager: [phone]  Patient name: [name] Medical record number: [id number] Date of birth: 1940-11-15 Age: 77 y.o. Gender: female  Primary Care Provider: Kathrene Alu, MD Consultants: ID, IR Code Status: Limited (DNI, confirmed on admission)  Chief Complaint: back pain  Assessment and Plan: [name] is a 77 y.o. female presenting with back pain x3 weeks. PMH is significant for T2DM, HFrEF (35-40%), CKD3, HTN, HLD, OSA on CPAP, afib on Eliquis, h/o CVA 2017, CAD s/p CABG 2009, morbid obesity.  Back Pain 2/2 L2-L3 Discitis and possible osteomyelitis MRI with edema surrounding discs, vertebrae, and soft tissues surrounding L2-L3 without drainable abscess. Presented stable with hypotension however remained alert and oriented without fever or leukocytosis. ESR elevated. Blood cultures drawn prior to initiation of IV Vanc x1. Unclear etiology as denies any trauma, recent falls, evidence of skin breakdown/infection on exam. Diabetes also well controlled. Has had about 100lb intentional weight loss over the past year, but denies night sweats, fevers. Also no prior h/o cancer. Given patient stability, will consult IR for biopsy prior to reinitiation of antibiotics and Eliquis.  - admit to med-surg, attending Dr. Owens Shark - s/p Vanc x1 (11/25) - IR consulted for biopsy, appreciate recs - ID consulted, appreciate recs - holding Eliquis and abx until after biopsy - oxycodone '5mg'$  q4h PRN for moderate pain - morphine '1mg'$  q4h PRN for severe pain - phenergan 12.'5mg'$  q6h PRN nausea - am CBC, BMP - U/A, urine cx - f/u blood cultures - vitals per unit routine - plan to make NPO prior to biopsy (likely 48hrs per  IR)  Type 2 Diabetes Last A1c 6.1 08/05/18. Lantus 17u injects in lower abdomen without missed doses or signs of infection/irritation. - Lantus 9u - sSSI - CBG AC, qhs   Afib On Apixaban, last dose  yesterday. Follows with [facility]. In afib on admission. - Holding Eliquis until after biopsy  HTN Low on admission, 94/72 after received dilaudid and robaxin, prior to this was low teens. On Coreg, losartan, spironolactone, torsemide at home. - continue coreg, spironolactone, torsemide - hold losartan given AKI.  AKI on CKDIII Cr. 2.41, baseline ~1.5-1.8. Followed by Dr. Audie Clear with [facility]. On torsemide with recent dose decrease by Nephro 11/18. Fluid overloaded on exam, 3+ pitting edema to knees. Given overall vital sign stability and fluid overload, will continue to diuresis without addition of IVF.  - continue torsemide given LE edema - hold losartan - am BMP  Anemia of CKD Hb 10.3 (baseline ~10-11). S/p Feraheme infusions in August. Continues on PO iron.  - continue home PO iron.  HFrEF, CAD s/p CABG, stable Ischemic cardiomyopathy with prominent RV failure. On torsemide, spironolactone, coreg. Follows with [facility], Dr. Angelena Form. ECHO 11/18 with LVEF 35-40%.  - continue torsemide given fluid overload - continue spironolactone, coreg.  HLD On lipitor at home - continue home med.  H/o CVA 2017 No new neurologic deficits.  OSA on CPAP Hasn't used CPAP recently due to it "not working right." Had sleep study done recently. - CPAP qhs  FEN/GI: Heart healthy/Carb modified Prophylaxis: SCDs  Disposition: admit to med-surg, attending Dr. Owens Shark  History of Present Illness:  [name] is a 77 y.o. female presenting with progressively worsening back pain x3 weeks. States she was seen at [facility] at the beginning of the month for back pain and was told she had a UTI. She was started on Cipro  and told to go to PCP if no better in 2 days. She then went to her PCP who explained she did not have a UTI given negative urine culture and no urinary symptoms (dysuria, increased frequency or urgency). She was instructed to stop Cipro and obtained lumbar xray and was  told she had arthritis, of which she has a long-standing history. She saw Cardiology and Nephrology with blood work without abnormalities apart from worsened CKDIII. Denies any other antibiotic use other than Cipro x4 days. Reports pain started at tailbone then moved to R lower back, then L lower back, and is now all across her lower back. Pain is sharp, stabbing and comes and goes with movement. Pain does not radiate down her legs. No bowel incontinence. Denies rashes, fevers, trauma, recent falls. Injects her lantus into lower abdomen, has not noticed any difficulties with this. Lives at home with her son. Uses walker at baseline. Has CNA to help with dressing, bathing, cleaning. Son reports he feels she is overall weaker in the past few weeks, moreso going from sitting to standing.  In ED, given diluadid and roboxan for pain. She was started on IV Vanc x1 after drawing blood cultures.  Review Of Systems: Per HPI with the following additions:   Review of Systems  Constitutional: Positive for malaise/fatigue. Negative for fever.  Gastrointestinal: Positive for constipation. Negative for abdominal pain.  Genitourinary: Negative for dysuria and frequency.  Musculoskeletal: Positive for back pain. Negative for falls.  Skin: Negative for rash.  Neurological: Positive for tingling and weakness. Negative for dizziness.   Patient Active Problem List   Diagnosis Date Noted  . Foot deformity 08/05/2018  . Intertrigo 08/05/2018  . Iron deficiency anemia 05/19/2018  . Cloudy urine 04/03/2018  . Dysuria 11/26/2017  . OSA on CPAP   . Hypokalemia 11/20/2017  . Longstanding persistent atrial fibrillation   . Ischemic cardiomyopathy   . Hypertension   . Hemiparesis ([place])   . Facial weakness, post-stroke   . Chronic systolic CHF (congestive heart failure) ([place])   . Chronic lower back pain   . Chronic kidney disease (CKD), stage III (moderate) (HCC)   . Arthritis   . Venous stasis 09/13/2017  . Open  wound of right knee, leg, and ankle with complication 11/57/2620  . Morbid obesity with BMI of 40.0-44.9, adult ([place]) 08/14/2017  . Bilateral leg edema 07/18/2017  . Blood loss anemia 04/09/2017  . Abdominal mass, RLQ (right lower quadrant)   . AKI (acute kidney injury) ([place]) 03/26/2017  . History of CVA with residual deficit 03/26/2017  . Pulmonary hypertension ([place]) 03/26/2017  . Morbid obesity ([place]) 03/26/2017  . Gastrointestinal hemorrhage 03/26/2017  . Rectal bleeding   . Chronic pain syndrome   . Coronary artery disease involving coronary bypass graft of native heart without angina pectoris   . Benign essential HTN   . DM type 2 with diabetic peripheral neuropathy ([place])   . Thrombocytopenia ([place])   . Cerebrovascular accident (CVA) due to embolism of right middle cerebral artery ([place])   . CKD (chronic kidney disease) stage 3, GFR 30-59 ml/min (HCC) 03/30/2016  . Chronic anticoagulation 03/30/2016  . Hyperlipidemia 01/31/2009  . ACC/AHA stage C congestive heart failure due to ischemic cardiomyopathy ([place]) 01/31/2009  . Chronic systolic heart failure ([place]) 01/31/2009  . Atrial fibrillation ([place]) 01/31/2009  . CAD (coronary artery disease) 05/12/2008    Past Medical History: Past Medical History:  Diagnosis Date  . ACC/AHA stage C congestive heart failure due  to ischemic cardiomyopathy ([place]) 01/31/2009   Qualifier: Diagnosis of  By: Olevia Perches, MD, Glenetta Hew   . ANXIETY 01/31/2009   Qualifier: Diagnosis of  By: Lovette Cliche, CNA, Christy    . Arthritis    "qwhere" (03/30/2016)  . Benign essential HTN   . CAD (coronary artery disease) 05/2008   a. s/p CABG in 2009.  Marland Kitchen Cerebrovascular accident (CVA) due to embolism of right middle cerebral artery ([place])   . Chronic anticoagulation   . Chronic kidney disease (CKD), stage III (moderate) (HCC)   . Chronic lower back pain   . Chronic pain syndrome   . Chronic systolic CHF (congestive heart failure) ([place])   . DM type 2 with diabetic  peripheral neuropathy ([place])   . Facial weakness, post-stroke   . Gastrointestinal hemorrhage 03/26/2017  . Hemiparesis ([place])   . History of CVA with residual deficit 03/26/2017  . Hyperlipidemia   . Hypertension   . Ischemic cardiomyopathy   . Longstanding persistent atrial fibrillation    a. Postoperative in 2009;  S/P transesophageal echocardiography-guided cardioversion, previously on Amiodarone and Coumadin. b. recurrent in April 2013 -> pt elected rate control strategy, initially Coumadin -> had stroke with subtherapeutic INR in 03/2016 and transitioned to Eliquis.  . Morbid obesity ([place]) 03/26/2017  . OSA on CPAP    severe OSA with AHI 34/hr  . Persistent atrial fibrillation   . Thrombocytopenia ([place])     Past Surgical History: Past Surgical History:  Procedure Laterality Date  . APPENDECTOMY  1946  . CATARACT EXTRACTION Right 2012  . COLONOSCOPY WITH PROPOFOL Left 03/27/2017   Procedure: COLONOSCOPY WITH PROPOFOL;  Surgeon: Ronnette Juniper, MD;  Location: [facility];  Service: Gastroenterology;  Laterality: Left;  . CORONARY ANGIOPLASTY WITH STENT PLACEMENT  2009   "put 2 stents in"  . HERNIA REPAIR    . JOINT REPLACEMENT    . TEE WITH CARDIOVERSION     Postoperative in 2009;  S/P transesophageal echocardiography-guided cardioversion,  . TOTAL KNEE ARTHROPLASTY Right 1994  . VENTRAL HERNIA REPAIR  10/1999   With mesh    Social History: Social History   Tobacco Use  . Smoking status: Never Smoker  . Smokeless tobacco: Never Used  Substance Use Topics  . Alcohol use: No  . Drug use: No   Additional social history:   Please also refer to relevant sections of EMR.  Family History: Family History  Problem Relation Age of Onset  . Clotting disorder Mother        Cerebral hemorrhage  . Emphysema Father        COD  . Lung cancer Brother   . Heart disease Neg Hx    Allergies and Medications: Allergies  Allergen Reactions  . Benazepril Other (See Comments)     Unknown reaction at age 60-65 - possibly dizziness  . Angiotensin Receptor Blockers Other (See Comments)    Hypotension reaction  . Fe-Succ-C-Thre-B12-Des Stomach Other (See Comments)    Unknown reaction  . Sulfamethoxazole-Trimethoprim Other (See Comments)    Unknown reaction   No current facility-administered medications on file prior to encounter.    Current Outpatient Medications on File Prior to Encounter  Medication Sig Dispense Refill  . apixaban (ELIQUIS) 5 MG TABS tablet TAKE ONE (1) TABLET BY MOUTH TWO (2) TIMES DAILY 60 tablet 11  . atorvastatin (LIPITOR) 40 MG tablet TAKE 1 TABLET BY MOUTH  DAILY AT 6 PM. 90 tablet 1  . carvedilol (COREG) 6.25 MG tablet Take 1  tablet (6.25 mg total) by mouth 2 (two) times daily with a meal. 60 tablet 6  . cholecalciferol (VITAMIN D) 1000 UNITS tablet Take 1,000 Units by mouth 2 (two) times daily.    . clotrimazole (CLOTRIMAZOLE GRX) 1 % cream Apply 1 application topically 2 (two) times daily. 45 g 1  . docusate sodium (COLACE) 100 MG capsule Take 200 mg by mouth at bedtime.     . ferrous sulfate 325 (65 FE) MG tablet Take 1 tablet (325 mg total) by mouth daily with breakfast. 60 tablet 3  . glucose blood (ONE TOUCH ULTRA TEST) test strip Use three times a day 100 each 3  . Insulin Glargine (LANTUS SOLOSTAR) 100 UNIT/ML Solostar Pen INJECT 17 UNITS INTO SKIN AT BEDTIME 15 mL 3  . Insulin Pen Needle (PEN NEEDLES) 32G X 4 MM MISC 1 each by Does not apply route at bedtime. Use to inject Lantus each night. 90 each 1  . losartan (COZAAR) 25 MG tablet Take 0.5 tablets (12.5 mg total) by mouth daily. 15 tablet 11  . nitroGLYCERIN (NITROSTAT) 0.4 MG SL tablet Place 0.4 mg under the tongue every 5 (five) minutes as needed for chest pain (up to 3 doses).     Marland Kitchen oxyCODONE (OXY IR/ROXICODONE) 5 MG immediate release tablet Take 1 tablet (5 mg total) by mouth every 8 (eight) hours as needed for up to 5 days for severe pain. 15 tablet 0  . rOPINIRole (REQUIP) 0.5  MG tablet TAKE 2 TABLETS BY MOUTH AT  BEDTIME 180 tablet 0  . spironolactone (ALDACTONE) 25 MG tablet Take 1 tablet (25 mg total) by mouth daily. 90 tablet 3  . torsemide (DEMADEX) 20 MG tablet TAKE 80 MG (4 TABLETS) IN THE AM AND 60 MG (3 TABLETS) IN THE PM 630 tablet 1  . torsemide (DEMADEX) 20 MG tablet Take 3 tablets (60 mg total) by mouth every morning AND 2 tablets (40 mg total) every evening. Please cancel all previous orders for current medication. Change in dosage or pill size.. 150 tablet 3  . traMADol (ULTRAM) 50 MG tablet Take 2 tablets PO every morning and take 1 tablet PO at night 90 tablet 0  . vitamin B-12 (CYANOCOBALAMIN) 1000 MCG tablet Take 1 tablet (1,000 mcg total) by mouth daily. 90 tablet 0    Objective: BP (!) 91/43 (BP Location: Right Arm)   Pulse 67   Temp 98.4 F (36.9 C) (Oral)   Resp 20   LMP  (LMP Unknown)   SpO2 100%  Exam: General: pleasant elderly, chronically ill appearing female lying in bed, in NAD Eyes: PERRL ENTM: MMM, poor dentition Neck: no cervical adenopathy Cardiovascular: irregular rhythm, regular rate. 3+ pitting edema to shins Respiratory: CTAB, no wheezes/rales, normal WOB on RA, appropriately saturated Gastrointestinal: soft, TTP in suprapubic area (states she has to urinate), non distended, hypoactive BS MSK: 3/5 R UE strength, 4/5 L UE, 4/5 to LE bilaterally (at baseline).  Derm: chronic venous stasis changes noted to LE bilaterally, no sacral/back wounds. noted, no rashes Neuro: alert and oriented x3, PERRL, sensation intact and symmetrical to U/LE bilaterally. Psych: mood and affect appropriate  Labs and Imaging: CBC BMET  Recent Labs  Lab 09/29/18 0952  WBC 8.0  HGB 11.4*  HCT 36.4  PLT 217   No results for input(s): NA, K, CL, CO2, BUN, CREATININE, GLUCOSE, CALCIUM in the last 168 hours.   Mr Lumbar Spine Wo Contrast  Result Date: 10/06/2018 CLINICAL DATA:  77 year old female  with back pain x4 weeks, severe this morning  with bilateral leg pain. Diabetes, chronic kidney disease. EXAM: MRI LUMBAR SPINE WITHOUT CONTRAST TECHNIQUE: Multiplanar, multisequence MR imaging of the lumbar spine was performed. No intravenous contrast was administered. COMPARISON:  Lumbar radiographs 09/18/2018. CT Abdomen and Pelvis 07/04/2018, lumbar MRI 04/02/2006. FINDINGS: Segmentation:  Normal. Alignment: Chronic mild dextroconvex lumbar scoliosis with straightening of lordosis. Stable alignment since August. Vertebrae: Confluent marrow edema in both the L2 and L3 vertebral bodies associated with abnormal signal throughout the intervening disc space and prevertebral/left greater than right paraspinal soft tissue edema and inflammation. No. Similar marrow edema elsewhere background bone marrow signal is within normal limits. Intact visible sacrum and SI joints. Conus medullaris and cauda equina: Conus extends to the L1 level. No lower spinal cord or conus signal abnormality. Paraspinal and other soft tissues: Negative visible abdominal viscera. Pelvic viscera remarkable for mildly distended urinary bladder. Left greater than right paraspinal soft tissue inflammation centered at the L2-L3 level (series 10, image 13) including edema in the psoas muscles and prevertebral soft tissue stranding (series 7, image 7). Possible developing small intramuscular fluid collection along the anterior left psoas at that level (series 8, image 11), 10-12 millimeters. This does not appear drainable. Left foraminal inflammation. Mild bilateral inflammation about the posterior elements. Mild heterogeneity in the bilateral lumbar erector spinae muscles otherwise. Disc levels: Flowing osteophytes resulting in ankylosis throughout the lower thoracic spine to the L1-L2 level demonstrated on the August CT. L2-L3: Loss of anterior vacuum disc at this level since August. Increased T2 and STIR signal within the disc space suspicious for fluid with bulky left eccentric circumferential  disc osteophyte complex, bulky far lateral involvement. Moderate to severe facet and ligament flavum hypertrophy. Moderate spinal stenosis with up to severe left lateral recess (left L3 nerve level) and left L2 neural foraminal stenosis. L3-L4: Disc desiccation with bulky left eccentric circumferential disc osteophyte complex, severe facet and ligament flavum hypertrophy. Moderate to severe spinal stenosis with moderate left and mild right L3 foraminal stenosis. L4-L5: Bulky right eccentric circumferential disc osteophyte complex with moderate facet and ligament flavum hypertrophy. Moderate to severe right lateral recess stenosis (right L5 nerve level) and right L4 foraminal stenosis. Borderline to mild spinal and left foraminal stenosis. L5-S1: Chronic disc space loss and circumferential disc osteophyte complex. Vacuum disc is also no longer evident here. Moderate facet hypertrophy. No spinal stenosis. Mild to moderate bilateral lateral recess and neural foraminal stenosis (bilateral L5 and S1 nerve levels). IMPRESSION: 1. Abnormal L2-L3 disc, vertebrae, and surrounding soft tissues most compatible with Acute Discitis Osteomyelitis. Confluent edema in both vertebrae and the surrounding paraspinal soft tissues greater on the left. No drainable fluid collection or abscess at this time, although attention directed to the left paraspinal phlegmon on follow-up imaging. Underlying multifactorial moderate to severe spinal, left lateral recess, and left foraminal stenosis. 2. Chronic DISH related ankylosis from the lower thoracic spine to the L2 level. L3 through S1 spinal degeneration with moderate to severe spinal stenosis at L3-L4, moderate to severe right side lateral recess and foraminal stenosis at L4-L5. Electronically Signed   By: Genevie Ann M.D.   On: 10/06/2018 07:06   Rory Percy, DO 10/06/2018, 8:22 AM PGY-2, [place] [facility] pager: [phone], text pages welcome

## 2018-10-06 NOTE — H&P (Signed)
Chief Complaint: Back pain  Referring Physician(s): Rory Percy  Supervising Physician: Arne Cleveland  Patient Status: [facility] - In-pt  History of Present Illness: [name] is a 77 y.o. female diabetic female with a 4 week history of worsening back pain for about 3 weeks.   MRI findings c/w discitis and paravertebral inflammation.   She denies any history of intravenous injections, back injections or previous spine surgery.  Other medical history includes hypertension,  hyperlipidemia, atrial fibrillation anticoagulated on Eliquis, coronary artery disease, systolic heart failure, stroke, and chronic kidney disease.  She confirms she has been off Eliquis x 24 hours.  MRI showed abnormal L2-L3 disc, vertebrae, and surrounding soft tissues most compatible with Acute Discitis Osteomyelitis.  We are asked to perform a disc aspiration   Past Medical History:  Diagnosis Date  . ACC/AHA stage C congestive heart failure due to ischemic cardiomyopathy ([place]) 01/31/2009   Qualifier: Diagnosis of  By: Olevia Perches, MD, Glenetta Hew   . ANXIETY 01/31/2009   Qualifier: Diagnosis of  By: Lovette Cliche, CNA, Christy    . Arthritis    "qwhere" (03/30/2016)  . Benign essential HTN   . CAD (coronary artery disease) 05/2008   a. s/p CABG in 2009.  Marland Kitchen Cerebrovascular accident (CVA) due to embolism of right middle cerebral artery ([place])   . Chronic anticoagulation   . Chronic kidney disease (CKD), stage III (moderate) (HCC)   . Chronic lower back pain   . Chronic pain syndrome   . Chronic systolic CHF (congestive heart failure) ([place])   . DM type 2 with diabetic peripheral neuropathy ([place])   . Facial weakness, post-stroke   . Gastrointestinal hemorrhage 03/26/2017  . Hemiparesis ([place])   . History of CVA with residual deficit 03/26/2017  . Hyperlipidemia   . Hypertension   . Ischemic cardiomyopathy   . Longstanding persistent atrial fibrillation    a. Postoperative in 2009;  S/P  transesophageal echocardiography-guided cardioversion, previously on Amiodarone and Coumadin. b. recurrent in April 2013 -> pt elected rate control strategy, initially Coumadin -> had stroke with subtherapeutic INR in 03/2016 and transitioned to Eliquis.  . Morbid obesity ([place]) 03/26/2017  . OSA on CPAP    severe OSA with AHI 34/hr  . Persistent atrial fibrillation   . Thrombocytopenia ([place])     Past Surgical History:  Procedure Laterality Date  . APPENDECTOMY  1946  . CATARACT EXTRACTION Right 2012  . COLONOSCOPY WITH PROPOFOL Left 03/27/2017   Procedure: COLONOSCOPY WITH PROPOFOL;  Surgeon: Ronnette Juniper, MD;  Location: [place];  Service: Gastroenterology;  Laterality: Left;  . CORONARY ANGIOPLASTY WITH STENT PLACEMENT  2009   "put 2 stents in"  . HERNIA REPAIR    . JOINT REPLACEMENT    . TEE WITH CARDIOVERSION     Postoperative in 2009;  S/P transesophageal echocardiography-guided cardioversion,  . TOTAL KNEE ARTHROPLASTY Right 1994  . VENTRAL HERNIA REPAIR  10/1999   With mesh    Allergies: Benazepril; Angiotensin receptor blockers; Fe-succ-c-thre-b12-des stomach; and Sulfamethoxazole-trimethoprim  Medications: Prior to Admission medications   Medication Sig Start Date End Date Taking? Authorizing Provider  acetaminophen (TYLENOL) 650 MG CR tablet Take 650 mg by mouth every 8 (eight) hours as needed for pain.   Yes [provider]  apixaban (ELIQUIS) 5 MG TABS tablet TAKE ONE (1) TABLET BY MOUTH TWO (2) TIMES DAILY 02/26/18  Yes Shirley Friar, PA-C  atorvastatin (LIPITOR) 40 MG tablet TAKE 1 TABLET BY MOUTH  DAILY  AT 6 PM. Patient taking differently: Take 40 mg by mouth daily at 6 PM.  06/02/18  Yes Winfrey, Alcario Drought, MD  carvedilol (COREG) 6.25 MG tablet Take 1 tablet (6.25 mg total) by mouth 2 (two) times daily with a meal. 10/23/17  Yes Carlyle Dolly, MD  cholecalciferol (VITAMIN D) 1000 UNITS tablet Take 1,000 Units by mouth 2 (two) times daily.    Yes [provider]  docusate sodium (COLACE) 100 MG capsule Take 200 mg by mouth at bedtime.    Yes [provider]  ferrous sulfate 325 (65 FE) MG tablet Take 1 tablet (325 mg total) by mouth daily with breakfast. 08/20/18  Yes Winfrey, Alcario Drought, MD  Insulin Glargine (LANTUS SOLOSTAR) 100 UNIT/ML Solostar Pen INJECT 17 UNITS INTO SKIN AT BEDTIME 08/20/18  Yes Winfrey, Alcario Drought, MD  losartan (COZAAR) 25 MG tablet Take 0.5 tablets (12.5 mg total) by mouth daily. 02/26/18 07/24/19 Yes Tillery, Satira Mccallum, PA-C  nitroGLYCERIN (NITROSTAT) 0.4 MG SL tablet Place 0.4 mg under the tongue every 5 (five) minutes as needed for chest pain (up to 3 doses).    Yes [provider]  oxyCODONE (OXY IR/ROXICODONE) 5 MG immediate release tablet Take 1 tablet (5 mg total) by mouth every 8 (eight) hours as needed for up to 5 days for severe pain. 10/03/18 10/08/18 Yes Winfrey, Alcario Drought, MD  rOPINIRole (REQUIP) 0.5 MG tablet TAKE 2 TABLETS BY MOUTH AT  BEDTIME Patient taking differently: Take 1 mg by mouth at bedtime.  07/15/18  Yes Winfrey, Alcario Drought, MD  spironolactone (ALDACTONE) 25 MG tablet Take 1 tablet (25 mg total) by mouth daily. 07/23/18 10/21/18 Yes Larey Dresser, MD  torsemide (DEMADEX) 20 MG tablet Take 3 tablets (60 mg total) by mouth every morning AND 2 tablets (40 mg total) every evening. Please cancel all previous orders for current medication. Change in dosage or pill size.. 09/29/18 12/28/18 Yes Larey Dresser, MD  traMADol Veatrice Bourbon) 50 MG tablet Take 2 tablets PO every morning and take 1 tablet PO at night 09/23/18  Yes Winfrey, Alcario Drought, MD  vitamin B-12 (CYANOCOBALAMIN) 1000 MCG tablet Take 1 tablet (1,000 mcg total) by mouth daily. 10/23/17  Yes Carlyle Dolly, MD  clotrimazole (CLOTRIMAZOLE GRX) 1 % cream Apply 1 application topically 2 (two) times daily. Patient not taking: Reported on 10/06/2018 08/20/18   Kathrene Alu, MD  glucose blood (ONE TOUCH ULTRA TEST)  test strip Use three times a day 05/06/18   Carlyle Dolly, MD  Insulin Pen Needle (PEN NEEDLES) 32G X 4 MM MISC 1 each by Does not apply route at bedtime. Use to inject Lantus each night. 06/17/18   McDiarmid, Blane Ohara, MD     Family History  Problem Relation Age of Onset  . Clotting disorder Mother        Cerebral hemorrhage  . Emphysema Father        COD  . Lung cancer Brother   . Heart disease Neg Hx     Social History   Socioeconomic History  . Marital status: Widowed    Spouse name: Not on file  . Number of children: Not on file  . Years of education: Not on file  . Highest education level: Not on file  Occupational History  . Not on file  Social Needs  . Financial resource strain: Not on file  . Food insecurity:    Worry: Not on file    Inability: Not  on file  . Transportation needs:    Medical: Not on file    Non-medical: Not on file  Tobacco Use  . Smoking status: Never Smoker  . Smokeless tobacco: Never Used  Substance and Sexual Activity  . Alcohol use: No  . Drug use: No  . Sexual activity: Not Currently  Lifestyle  . Physical activity:    Days per week: Not on file    Minutes per session: Not on file  . Stress: Not on file  Relationships  . Social connections:    Talks on phone: Not on file    Gets together: Not on file    Attends religious service: Not on file    Active member of club or organization: Not on file    Attends meetings of clubs or organizations: Not on file    Relationship status: Not on file  Other Topics Concern  . Not on file  Social History Narrative   Widowed   Lives alone in [place]   2 children   Not routinely exercising     Review of Systems: A 12 point ROS discussed and pertinent positives are indicated in the HPI above.  All other systems are negative.  Review of Systems  Vital Signs: BP (!) 97/43 (BP Location: Left Arm)   Pulse (!) 56   Temp 99.4 F (37.4 C) (Oral)   Resp 16   LMP  (LMP Unknown)   SpO2  98%   Physical Exam  Constitutional: She is oriented to person, place, and time. She appears well-developed.  HENT:  Head: Normocephalic and atraumatic.  Eyes: EOM are normal.  Neck: Normal range of motion.  Cardiovascular: Normal rate, regular rhythm and normal heart sounds.  Pulmonary/Chest: Effort normal and breath sounds normal.  Abdominal: Soft.  Musculoskeletal: Normal range of motion.       Lumbar back: She exhibits pain.       Back:  Neurological: She is alert and oriented to person, place, and time.  Skin: Skin is warm and dry.  Psychiatric: She has a normal mood and affect. Her behavior is normal. Judgment and thought content normal.  Vitals reviewed.   Imaging: Dg Lumbar Spine Complete  Result Date: 09/18/2018 CLINICAL DATA:  Pt c/o left side low back pain x 6 days. No hx of fall. Pt states she was diagnosed with UTI on Monday by an Urgent Care. Hx of CAD, CKD, CHF, diabetes, HTN. *Tech to call report* EXAM: LUMBAR SPINE - COMPLETE 4+ VIEW COMPARISON:  CT of the abdomen and pelvis on 07/04/2018 FINDINGS: There are significant degenerative changes throughout the LOWER thoracic and lumbar spine. Significant facet hypertrophy, disc height loss and uncovertebral spurring at multiple levels. No spondylolisthesis. No evidence for acute fracture. Visualized bowel gas pattern is nonobstructive. There is moderate stool burden. Atherosclerotic calcification of the abdominal aorta. IMPRESSION: Thoraco lumbar spondylosis. No evidence for acute abnormality. Aortic atherosclerosis.  (ICD10-I70.0) Electronically Signed   By: Nolon Nations M.D.   On: 09/18/2018 11:21   Mr Lumbar Spine Wo Contrast  Result Date: 10/06/2018 CLINICAL DATA:  77 year old female with back pain x4 weeks, severe this morning with bilateral leg pain. Diabetes, chronic kidney disease. EXAM: MRI LUMBAR SPINE WITHOUT CONTRAST TECHNIQUE: Multiplanar, multisequence MR imaging of the lumbar spine was performed. No  intravenous contrast was administered. COMPARISON:  Lumbar radiographs 09/18/2018. CT Abdomen and Pelvis 07/04/2018, lumbar MRI 04/02/2006. FINDINGS: Segmentation:  Normal. Alignment: Chronic mild dextroconvex lumbar scoliosis with straightening of lordosis. Stable alignment since  August. Vertebrae: Confluent marrow edema in both the L2 and L3 vertebral bodies associated with abnormal signal throughout the intervening disc space and prevertebral/left greater than right paraspinal soft tissue edema and inflammation. No. Similar marrow edema elsewhere background bone marrow signal is within normal limits. Intact visible sacrum and SI joints. Conus medullaris and cauda equina: Conus extends to the L1 level. No lower spinal cord or conus signal abnormality. Paraspinal and other soft tissues: Negative visible abdominal viscera. Pelvic viscera remarkable for mildly distended urinary bladder. Left greater than right paraspinal soft tissue inflammation centered at the L2-L3 level (series 10, image 13) including edema in the psoas muscles and prevertebral soft tissue stranding (series 7, image 7). Possible developing small intramuscular fluid collection along the anterior left psoas at that level (series 8, image 11), 10-12 millimeters. This does not appear drainable. Left foraminal inflammation. Mild bilateral inflammation about the posterior elements. Mild heterogeneity in the bilateral lumbar erector spinae muscles otherwise. Disc levels: Flowing osteophytes resulting in ankylosis throughout the lower thoracic spine to the L1-L2 level demonstrated on the August CT. L2-L3: Loss of anterior vacuum disc at this level since August. Increased T2 and STIR signal within the disc space suspicious for fluid with bulky left eccentric circumferential disc osteophyte complex, bulky far lateral involvement. Moderate to severe facet and ligament flavum hypertrophy. Moderate spinal stenosis with up to severe left lateral recess (left L3  nerve level) and left L2 neural foraminal stenosis. L3-L4: Disc desiccation with bulky left eccentric circumferential disc osteophyte complex, severe facet and ligament flavum hypertrophy. Moderate to severe spinal stenosis with moderate left and mild right L3 foraminal stenosis. L4-L5: Bulky right eccentric circumferential disc osteophyte complex with moderate facet and ligament flavum hypertrophy. Moderate to severe right lateral recess stenosis (right L5 nerve level) and right L4 foraminal stenosis. Borderline to mild spinal and left foraminal stenosis. L5-S1: Chronic disc space loss and circumferential disc osteophyte complex. Vacuum disc is also no longer evident here. Moderate facet hypertrophy. No spinal stenosis. Mild to moderate bilateral lateral recess and neural foraminal stenosis (bilateral L5 and S1 nerve levels). IMPRESSION: 1. Abnormal L2-L3 disc, vertebrae, and surrounding soft tissues most compatible with Acute Discitis Osteomyelitis. Confluent edema in both vertebrae and the surrounding paraspinal soft tissues greater on the left. No drainable fluid collection or abscess at this time, although attention directed to the left paraspinal phlegmon on follow-up imaging. Underlying multifactorial moderate to severe spinal, left lateral recess, and left foraminal stenosis. 2. Chronic DISH related ankylosis from the lower thoracic spine to the L2 level. L3 through S1 spinal degeneration with moderate to severe spinal stenosis at L3-L4, moderate to severe right side lateral recess and foraminal stenosis at L4-L5. Electronically Signed   By: Genevie Ann M.D.   On: 10/06/2018 07:06   Dg Chest Port 1 View  Result Date: 10/06/2018 CLINICAL DATA:  Spine infection EXAM: PORTABLE CHEST 1 VIEW COMPARISON:  06/24/2008 FINDINGS: Cardiac shadow is at the upper limits of normal in size. Postsurgical changes are noted. The lungs are well aerated bilaterally without focal infiltrate or sizable effusion. Mild  degenerative changes of thoracic spine are noted. IMPRESSION: No acute abnormality seen. Electronically Signed   By: Inez Catalina M.D.   On: 10/06/2018 12:35    Labs:  CBC: Recent Labs    06/03/18 1100 07/23/18 0922 09/29/18 0952 10/06/18 0758  WBC 5.0 5.8 8.0 7.5  HGB 10.3* 12.9 11.4* 10.3*  HCT 33.4* 41.8 36.4 33.0*  PLT 125* 112* 217 169  COAGS: No results for input(s): INR, APTT in the last 8760 hours.  BMP: Recent Labs    06/13/18 1017 07/23/18 0922 08/06/18 0825 10/06/18 0758  NA 139 136 138 135  K 4.3 5.0 4.0 4.7  CL 101 99 96* 99  CO2 29 27 31 29   GLUCOSE 138* 96 90 151*  BUN 52* 52* 50* 103*  CALCIUM 10.3 10.7* 10.6* 10.0  CREATININE 1.73* 1.87* 1.56* 2.41*  GFRNONAA 27* 24* 30* 18*  GFRAA 31* 28* 35* 21*    LIVER FUNCTION TESTS: No results for input(s): BILITOT, AST, ALT, ALKPHOS, PROT, ALBUMIN in the last 8760 hours.  TUMOR MARKERS: No results for input(s): AFPTM, CEA, CA199, CHROMGRNA in the last 8760 hours.  Assessment and Plan:  Abnormal L2-L3 disc, vertebrae, and surrounding soft tissues most compatible with Acute Discitis Osteomyelitis.  Will proceed with image guided disc aspiration tomorrow.  Risks and benefits of disc aspiration were discussed with the patient including, but not limited to bleeding, infection, damage to adjacent structures or low yield requiring additional tests.  All of the patient's questions were answered, patient is agreeable to proceed. Consent signed and in chart.  Thank you for this interesting consult.  I greatly enjoyed meeting [name] and look forward to participating in their care.  A copy of this report was sent to the requesting provider on this date.  Electronically Signed: Murrell Redden, PA-C   10/06/2018, 3:23 PM      I spent a total of 40 Minutes in face to face in clinical consultation, greater than 50% of which was counseling/coordinating care for disc aspiration.

## 2018-10-06 NOTE — ED Provider Notes (Signed)
[facility] [facility] EMERGENCY DEPARTMENT Provider Note   CSN: [id number] Arrival date & time: 10/06/18  0431     History   Chief Complaint Chief Complaint  Patient presents with  . Back Pain    HPI [name] is a 77 y.o. female.  The history is provided by the patient.  Back Pain    She has a history of hypertension, diabetes, hyperlipidemia, atrial fibrillation anticoagulated on apixaban, coronary artery disease, systolic heart failure, stroke, chronic kidney disease, and comes in with low back pain for the last 3-1/2 weeks.  Pain is across her lower lumbar area and is worse with certain movements, not improved by anything.  She has been taking oxycodone and acetaminophen without relief.  She has had constipation secondary to narcotic use but no incontinence.  She denies any urinary difficulty.  She denies weakness, numbness, tingling.  However, tonight, she was unable to walk back to her bed from the bathroom because of pain.  Past Medical History:  Diagnosis Date  . ACC/AHA stage C congestive heart failure due to ischemic cardiomyopathy ([place]) 01/31/2009   Qualifier: Diagnosis of  By: Olevia Perches, MD, Glenetta Hew   . ANXIETY 01/31/2009   Qualifier: Diagnosis of  By: Lovette Cliche, CNA, Christy    . Arthritis    "qwhere" (03/30/2016)  . Benign essential HTN   . CAD (coronary artery disease) 05/2008   a. s/p CABG in 2009.  Marland Kitchen Cerebrovascular accident (CVA) due to embolism of right middle cerebral artery ([place])   . Chronic anticoagulation   . Chronic kidney disease (CKD), stage III (moderate) (HCC)   . Chronic lower back pain   . Chronic pain syndrome   . Chronic systolic CHF (congestive heart failure) ([place])   . DM type 2 with diabetic peripheral neuropathy ([place])   . Facial weakness, post-stroke   . Gastrointestinal hemorrhage 03/26/2017  . Hemiparesis ([place])   . History of CVA with residual deficit 03/26/2017  . Hyperlipidemia   . Hypertension   . Ischemic  cardiomyopathy   . Longstanding persistent atrial fibrillation    a. Postoperative in 2009;  S/P transesophageal echocardiography-guided cardioversion, previously on Amiodarone and Coumadin. b. recurrent in April 2013 -> pt elected rate control strategy, initially Coumadin -> had stroke with subtherapeutic INR in 03/2016 and transitioned to Eliquis.  . Morbid obesity ([place]) 03/26/2017  . OSA on CPAP    severe OSA with AHI 34/hr  . Persistent atrial fibrillation   . Thrombocytopenia [facility])     Patient Active Problem List   Diagnosis Date Noted  . Foot deformity 08/05/2018  . Intertrigo 08/05/2018  . Iron deficiency anemia 05/19/2018  . Cloudy urine 04/03/2018  . Dysuria 11/26/2017  . OSA on CPAP   . Hypokalemia 11/20/2017  . Longstanding persistent atrial fibrillation   . Ischemic cardiomyopathy   . Hypertension   . Hemiparesis ([place])   . Facial weakness, post-stroke   . Chronic systolic CHF (congestive heart failure) ([place])   . Chronic lower back pain   . Chronic kidney disease (CKD), stage III (moderate) (HCC)   . Arthritis   . Venous stasis 09/13/2017  . Open wound of right knee, leg, and ankle with complication 76/54/6503  . Morbid obesity with BMI of 40.0-44.9, adult ([place]) 08/14/2017  . Bilateral leg edema 07/18/2017  . Blood loss anemia 04/09/2017  . Abdominal mass, RLQ (right lower quadrant)   . AKI (acute kidney injury) ([place]) 03/26/2017  . History of CVA with residual deficit  03/26/2017  . Pulmonary hypertension ([place]) 03/26/2017  . Morbid obesity ([place]) 03/26/2017  . Gastrointestinal hemorrhage 03/26/2017  . Rectal bleeding   . Chronic pain syndrome   . Coronary artery disease involving coronary bypass graft of native heart without angina pectoris   . Benign essential HTN   . DM type 2 with diabetic peripheral neuropathy ([place])   . Thrombocytopenia ([place])   . Cerebrovascular accident (CVA) due to embolism of right middle cerebral artery ([place])   . CKD (chronic kidney  disease) stage 3, GFR 30-59 ml/min (HCC) 03/30/2016  . Chronic anticoagulation 03/30/2016  . Hyperlipidemia 01/31/2009  . ACC/AHA stage C congestive heart failure due to ischemic cardiomyopathy ([place]) 01/31/2009  . Chronic systolic heart failure ([place]) 01/31/2009  . Atrial fibrillation ([place]) 01/31/2009  . CAD (coronary artery disease) 05/12/2008    Past Surgical History:  Procedure Laterality Date  . APPENDECTOMY  1946  . CATARACT EXTRACTION Right 2012  . COLONOSCOPY WITH PROPOFOL Left 03/27/2017   Procedure: COLONOSCOPY WITH PROPOFOL;  Surgeon: Ronnette Juniper, MD;  Location: [place];  Service: Gastroenterology;  Laterality: Left;  . CORONARY ANGIOPLASTY WITH STENT PLACEMENT  2009   "put 2 stents in"  . HERNIA REPAIR    . JOINT REPLACEMENT    . TEE WITH CARDIOVERSION     Postoperative in 2009;  S/P transesophageal echocardiography-guided cardioversion,  . TOTAL KNEE ARTHROPLASTY Right 1994  . VENTRAL HERNIA REPAIR  10/1999   With mesh     OB History   None      Home Medications    Prior to Admission medications   Medication Sig Start Date End Date Taking? Authorizing Provider  apixaban (ELIQUIS) 5 MG TABS tablet TAKE ONE (1) TABLET BY MOUTH TWO (2) TIMES DAILY 02/26/18   Shirley Friar, PA-C  atorvastatin (LIPITOR) 40 MG tablet TAKE 1 TABLET BY MOUTH  DAILY AT 6 PM. 06/02/18   Winfrey, Alcario Drought, MD  carvedilol (COREG) 6.25 MG tablet Take 1 tablet (6.25 mg total) by mouth 2 (two) times daily with a meal. 10/23/17   Carlyle Dolly, MD  cholecalciferol (VITAMIN D) 1000 UNITS tablet Take 1,000 Units by mouth 2 (two) times daily.    [provider]  clotrimazole (CLOTRIMAZOLE GRX) 1 % cream Apply 1 application topically 2 (two) times daily. 08/20/18   Kathrene Alu, MD  docusate sodium (COLACE) 100 MG capsule Take 200 mg by mouth at bedtime.     [provider]  ferrous sulfate 325 (65 FE) MG tablet Take 1 tablet (325 mg total) by mouth daily with  breakfast. 08/20/18   Kathrene Alu, MD  glucose blood (ONE TOUCH ULTRA TEST) test strip Use three times a day 05/06/18   Carlyle Dolly, MD  Insulin Glargine (LANTUS SOLOSTAR) 100 UNIT/ML Solostar Pen INJECT 17 UNITS INTO SKIN AT BEDTIME 08/20/18   Winfrey, Alcario Drought, MD  Insulin Pen Needle (PEN NEEDLES) 32G X 4 MM MISC 1 each by Does not apply route at bedtime. Use to inject Lantus each night. 06/17/18   McDiarmid, Blane Ohara, MD  losartan (COZAAR) 25 MG tablet Take 0.5 tablets (12.5 mg total) by mouth daily. 02/26/18 07/24/19  Shirley Friar, PA-C  nitroGLYCERIN (NITROSTAT) 0.4 MG SL tablet Place 0.4 mg under the tongue every 5 (five) minutes as needed for chest pain (up to 3 doses).     [provider]  oxyCODONE (OXY IR/ROXICODONE) 5 MG immediate release tablet Take 1 tablet (5 mg total) by  mouth every 8 (eight) hours as needed for up to 5 days for severe pain. 10/03/18 10/08/18  Kathrene Alu, MD  rOPINIRole (REQUIP) 0.5 MG tablet TAKE 2 TABLETS BY MOUTH AT  BEDTIME 07/15/18   Kathrene Alu, MD  spironolactone (ALDACTONE) 25 MG tablet Take 1 tablet (25 mg total) by mouth daily. 07/23/18 10/21/18  Larey Dresser, MD  torsemide (DEMADEX) 20 MG tablet TAKE 80 MG (4 TABLETS) IN THE AM AND 60 MG (3 TABLETS) IN THE PM 09/29/18   Winfrey, Alcario Drought, MD  torsemide (DEMADEX) 20 MG tablet Take 3 tablets (60 mg total) by mouth every morning AND 2 tablets (40 mg total) every evening. Please cancel all previous orders for current medication. Change in dosage or pill size.. 09/29/18 12/28/18  Larey Dresser, MD  traMADol Veatrice Bourbon) 50 MG tablet Take 2 tablets PO every morning and take 1 tablet PO at night 09/23/18   Kathrene Alu, MD  vitamin B-12 (CYANOCOBALAMIN) 1000 MCG tablet Take 1 tablet (1,000 mcg total) by mouth daily. 10/23/17   Carlyle Dolly, MD    Family History Family History  Problem Relation Age of Onset  . Clotting disorder Mother        Cerebral hemorrhage    . Emphysema Father        COD  . Lung cancer Brother   . Heart disease Neg Hx     Social History Social History   Tobacco Use  . Smoking status: Never Smoker  . Smokeless tobacco: Never Used  Substance Use Topics  . Alcohol use: No  . Drug use: No     Allergies   Benazepril; Angiotensin receptor blockers; Fe-succ-c-thre-b12-des stomach; and Sulfamethoxazole-trimethoprim   Review of Systems Review of Systems  Musculoskeletal: Positive for back pain.  All other systems reviewed and are negative.    Physical Exam Updated Vital Signs BP (!) 115/57   Pulse 84   Temp (!) 97.4 F (36.3 C) (Oral)   Resp 18   LMP  (LMP Unknown)   SpO2 100%   Physical Exam  Nursing note and vitals reviewed.  77 year old female, resting comfortably and in no acute distress. Vital signs are normal. Oxygen saturation is 100%, which is normal. Head is normocephalic and atraumatic. PERRLA, EOMI. Oropharynx is clear. Neck is nontender and supple without adenopathy or JVD. Back is tender across the lower lumbar area with moderate bilateral paralumbar spasm.  Straight leg raise is positive bilaterally at 15 degrees.  There is no CVA tenderness. Lungs are clear without rales, wheezes, or rhonchi. Chest is nontender. Heart has regular rate and rhythm without murmur. Abdomen is soft, flat, nontender without masses or hepatosplenomegaly and peristalsis is normoactive. Extremities have 2+ edema, full range of motion is present. Skin is warm and dry without rash. Neurologic: Mental status is normal, cranial nerves are intact, there are no motor or sensory deficits.  ED Treatments / Results  Labs (all labs ordered are listed, but only abnormal results are displayed) Labs Reviewed - No data to display  Radiology Mr Lumbar Spine Wo Contrast  Result Date: 10/06/2018 CLINICAL DATA:  77 year old female with back pain x4 weeks, severe this morning with bilateral leg pain. Diabetes, chronic kidney  disease. EXAM: MRI LUMBAR SPINE WITHOUT CONTRAST TECHNIQUE: Multiplanar, multisequence MR imaging of the lumbar spine was performed. No intravenous contrast was administered. COMPARISON:  Lumbar radiographs 09/18/2018. CT Abdomen and Pelvis 07/04/2018, lumbar MRI 04/02/2006. FINDINGS: Segmentation:  Normal. Alignment: Chronic mild  dextroconvex lumbar scoliosis with straightening of lordosis. Stable alignment since August. Vertebrae: Confluent marrow edema in both the L2 and L3 vertebral bodies associated with abnormal signal throughout the intervening disc space and prevertebral/left greater than right paraspinal soft tissue edema and inflammation. No. Similar marrow edema elsewhere background bone marrow signal is within normal limits. Intact visible sacrum and SI joints. Conus medullaris and cauda equina: Conus extends to the L1 level. No lower spinal cord or conus signal abnormality. Paraspinal and other soft tissues: Negative visible abdominal viscera. Pelvic viscera remarkable for mildly distended urinary bladder. Left greater than right paraspinal soft tissue inflammation centered at the L2-L3 level (series 10, image 13) including edema in the psoas muscles and prevertebral soft tissue stranding (series 7, image 7). Possible developing small intramuscular fluid collection along the anterior left psoas at that level (series 8, image 11), 10-12 millimeters. This does not appear drainable. Left foraminal inflammation. Mild bilateral inflammation about the posterior elements. Mild heterogeneity in the bilateral lumbar erector spinae muscles otherwise. Disc levels: Flowing osteophytes resulting in ankylosis throughout the lower thoracic spine to the L1-L2 level demonstrated on the August CT. L2-L3: Loss of anterior vacuum disc at this level since August. Increased T2 and STIR signal within the disc space suspicious for fluid with bulky left eccentric circumferential disc osteophyte complex, bulky far lateral  involvement. Moderate to severe facet and ligament flavum hypertrophy. Moderate spinal stenosis with up to severe left lateral recess (left L3 nerve level) and left L2 neural foraminal stenosis. L3-L4: Disc desiccation with bulky left eccentric circumferential disc osteophyte complex, severe facet and ligament flavum hypertrophy. Moderate to severe spinal stenosis with moderate left and mild right L3 foraminal stenosis. L4-L5: Bulky right eccentric circumferential disc osteophyte complex with moderate facet and ligament flavum hypertrophy. Moderate to severe right lateral recess stenosis (right L5 nerve level) and right L4 foraminal stenosis. Borderline to mild spinal and left foraminal stenosis. L5-S1: Chronic disc space loss and circumferential disc osteophyte complex. Vacuum disc is also no longer evident here. Moderate facet hypertrophy. No spinal stenosis. Mild to moderate bilateral lateral recess and neural foraminal stenosis (bilateral L5 and S1 nerve levels). IMPRESSION: 1. Abnormal L2-L3 disc, vertebrae, and surrounding soft tissues most compatible with Acute Discitis Osteomyelitis. Confluent edema in both vertebrae and the surrounding paraspinal soft tissues greater on the left. No drainable fluid collection or abscess at this time, although attention directed to the left paraspinal phlegmon on follow-up imaging. Underlying multifactorial moderate to severe spinal, left lateral recess, and left foraminal stenosis. 2. Chronic DISH related ankylosis from the lower thoracic spine to the L2 level. L3 through S1 spinal degeneration with moderate to severe spinal stenosis at L3-L4, moderate to severe right side lateral recess and foraminal stenosis at L4-L5. Electronically Signed   By: Genevie Ann M.D.   On: 10/06/2018 07:06    Procedures Procedures   Medications Ordered in ED Medications  vancomycin (VANCOCIN) IVPB 1000 mg/200 mL premix (1,000 mg Intravenous New Bag/Given 10/06/18 4193)  HYDROmorphone  (DILAUDID) injection 0.5 mg (0.5 mg Intravenous Given 10/06/18 0518)  ondansetron (ZOFRAN) injection 4 mg (4 mg Intravenous Given 10/06/18 0518)  methocarbamol (ROBAXIN) 1,000 mg in dextrose 5 % 50 mL IVPB (0 mg Intravenous Stopped 10/06/18 0802)  LORazepam (ATIVAN) injection 0.5 mg (0.5 mg Intravenous Given 10/06/18 0552)     Initial Impression / Assessment and Plan / ED Course  I have reviewed the triage vital signs and the nursing notes.  Pertinent labs & imaging results  that were available during my care of the patient were reviewed by me and considered in my medical decision making (see chart for details).  Low back pain which has failed to respond to conservative measures thus far.  She cannot take NSAIDs because of anticoagulant use, and also history of chronic kidney disease.  No red flags to suspect serious pathology, no neurologic injury.  Old records are reviewed, and she had a CT of abdomen and pelvis in August which showed degenerative changes in the spine but no other bony lesions.  Lumbar spine x-rays on November 7 also showed degenerative changes.  However, since patient has not responded to outpatient measures at this point, will send for MRI scan.  In the meantime, we will start her on a muscle relaxer and she is given a dose of intravenous methocarbamol.  She feels significantly better after above-noted treatment.  However, MRI shows evidence of discitis and possible osteomyelitis.  Blood cultures were obtained and she is started on vancomycin.  Case is discussed with Dr. Lindell Noe of family practice service who agrees to admit the patient.  Final Clinical Impressions(s) / ED Diagnoses   Final diagnoses:  Discitis of lumbar region    ED Discharge Orders    None       Delora Fuel, MD 47/09/29 [phone]

## 2018-10-06 NOTE — Plan of Care (Signed)
  Problem: Pain Managment: Goal: General experience of comfort will improve Outcome: Progressing   Problem: Safety: Goal: Ability to remain free from injury will improve Outcome: Progressing   

## 2018-10-06 NOTE — ED Notes (Signed)
Robaxin stopped while patient is getting MRI.  Belongings and family member moved to progressive bed 18.  Transport taking to MRI at this time.

## 2018-10-06 NOTE — Progress Notes (Signed)
Pt declined use of CPAP tonight. CPAP at bedside for patient use if she changes her mind- will cont to offer.  Pt has not been wearing CPAP at home due to nose bleeds as per patient.

## 2018-10-07 ENCOUNTER — Inpatient Hospital Stay (HOSPITAL_COMMUNITY): Payer: Medicare Other

## 2018-10-07 ENCOUNTER — Other Ambulatory Visit: Payer: Self-pay

## 2018-10-07 DIAGNOSIS — E118 Type 2 diabetes mellitus with unspecified complications: Secondary | ICD-10-CM

## 2018-10-07 DIAGNOSIS — N183 Chronic kidney disease, stage 3 (moderate): Secondary | ICD-10-CM

## 2018-10-07 DIAGNOSIS — I50814 Right heart failure due to left heart failure: Secondary | ICD-10-CM

## 2018-10-07 LAB — BASIC METABOLIC PANEL
Anion gap: 8 (ref 5–15)
BUN: 96 mg/dL — AB (ref 8–23)
CHLORIDE: 102 mmol/L (ref 98–111)
CO2: 24 mmol/L (ref 22–32)
CREATININE: 2.27 mg/dL — AB (ref 0.44–1.00)
Calcium: 9.8 mg/dL (ref 8.9–10.3)
GFR calc non Af Amer: 19 mL/min — ABNORMAL LOW (ref >60)
GFR, EST AFRICAN AMERICAN: 22 mL/min — AB (ref >60)
Glucose, Bld: 175 mg/dL — ABNORMAL HIGH (ref 70–99)
Potassium: 4.5 mmol/L (ref 3.5–5.1)
Sodium: 134 mmol/L — ABNORMAL LOW (ref 135–145)

## 2018-10-07 LAB — CBC
HEMATOCRIT: 29.9 % — AB (ref 36.0–46.0)
Hemoglobin: 9.7 g/dL — ABNORMAL LOW (ref 12.0–15.0)
MCH: 30.9 pg (ref 26.0–34.0)
MCHC: 32.4 g/dL (ref 30.0–36.0)
MCV: 95.2 fL (ref 80.0–100.0)
NRBC: 0 % (ref 0.0–0.2)
Platelets: 141 10*3/uL — ABNORMAL LOW (ref 150–400)
RBC: 3.14 MIL/uL — ABNORMAL LOW (ref 3.87–5.11)
RDW: 13.2 % (ref 11.5–15.5)
WBC: 7 10*3/uL (ref 4.0–10.5)

## 2018-10-07 LAB — BLOOD CULTURE ID PANEL (REFLEXED)
ACINETOBACTER BAUMANNII: NOT DETECTED
CANDIDA ALBICANS: NOT DETECTED
CANDIDA GLABRATA: NOT DETECTED
CANDIDA KRUSEI: NOT DETECTED
CANDIDA TROPICALIS: NOT DETECTED
Candida parapsilosis: NOT DETECTED
ENTEROBACTER CLOACAE COMPLEX: NOT DETECTED
ESCHERICHIA COLI: NOT DETECTED
Enterobacteriaceae species: NOT DETECTED
Enterococcus species: NOT DETECTED
Haemophilus influenzae: NOT DETECTED
Klebsiella oxytoca: NOT DETECTED
Klebsiella pneumoniae: NOT DETECTED
Listeria monocytogenes: NOT DETECTED
Neisseria meningitidis: NOT DETECTED
PROTEUS SPECIES: NOT DETECTED
PSEUDOMONAS AERUGINOSA: NOT DETECTED
STREPTOCOCCUS SPECIES: DETECTED — AB
Serratia marcescens: NOT DETECTED
Staphylococcus aureus (BCID): NOT DETECTED
Staphylococcus species: NOT DETECTED
Streptococcus agalactiae: NOT DETECTED
Streptococcus pneumoniae: NOT DETECTED
Streptococcus pyogenes: NOT DETECTED

## 2018-10-07 LAB — PROTIME-INR
INR: 1.54
Prothrombin Time: 18.3 seconds — ABNORMAL HIGH (ref 11.4–15.2)

## 2018-10-07 LAB — GLUCOSE, CAPILLARY
GLUCOSE-CAPILLARY: 187 mg/dL — AB (ref 70–99)
Glucose-Capillary: 144 mg/dL — ABNORMAL HIGH (ref 70–99)
Glucose-Capillary: 123 mg/dL — ABNORMAL HIGH (ref 70–99)
Glucose-Capillary: 240 mg/dL — ABNORMAL HIGH (ref 70–99)

## 2018-10-07 LAB — C-REACTIVE PROTEIN: CRP: 10.2 mg/dL — ABNORMAL HIGH (ref <1.0)

## 2018-10-07 MED ORDER — TORSEMIDE 20 MG PO TABS: 60.0000 mg | ORAL_TABLET | Freq: Every day | ORAL | Status: DC

## 2018-10-07 MED ORDER — OXYCODONE HCL 5 MG PO TABS
5.0000 mg | ORAL_TABLET | Freq: Four times a day (QID) | ORAL | Status: DC | PRN
  Administered 2018-10-07 – 2018-10-11 (×2): 5 mg via ORAL
  Filled 2018-10-07 (×2): qty 1

## 2018-10-07 MED ORDER — OXYCODONE HCL 5 MG PO TABS: 2.5000 mg | ORAL_TABLET | Freq: Four times a day (QID) | ORAL | Status: DC | PRN

## 2018-10-07 MED ORDER — TORSEMIDE 20 MG PO TABS
60.0000 mg | ORAL_TABLET | Freq: Once | ORAL | Status: AC
  Administered 2018-10-07: 60 mg via ORAL
  Filled 2018-10-07: qty 3

## 2018-10-07 NOTE — Progress Notes (Signed)
PHARMACY - PHYSICIAN COMMUNICATION CRITICAL VALUE ALERT - BLOOD CULTURE IDENTIFICATION (BCID)  [name] is an 77 y.o. female who presented to [facility] on 10/06/2018 with a chief complaint of back pain, osteomyelitis/discitis.  Antibiotics on hold for now, with plans for disc aspiration in IR 11/26    Assessment:  1/2 Blood cultures growing Strep species.   Strep species can be a contaminant.     Name of physician (or Provider) Contacted: Dr. Maudie Mercury  Current antibiotics: None   Changes to prescribed antibiotics recommended:   In case Strep is a contaminant, would hold antibiotics until after disc aspiration, then start Rocephin 2 g IV q24h until results of aspiration known.    Results for orders placed or performed during the hospital encounter of 10/06/18  Blood Culture ID Panel (Reflexed) (Collected: 10/06/2018  7:59 AM)  Result Value Ref Range   Enterococcus species NOT DETECTED NOT DETECTED   Listeria monocytogenes NOT DETECTED NOT DETECTED   Staphylococcus species NOT DETECTED NOT DETECTED   Staphylococcus aureus (BCID) NOT DETECTED NOT DETECTED   Streptococcus species DETECTED (A) NOT DETECTED   Streptococcus agalactiae NOT DETECTED NOT DETECTED   Streptococcus pneumoniae NOT DETECTED NOT DETECTED   Streptococcus pyogenes NOT DETECTED NOT DETECTED   Acinetobacter baumannii NOT DETECTED NOT DETECTED   Enterobacteriaceae species NOT DETECTED NOT DETECTED   Enterobacter cloacae complex NOT DETECTED NOT DETECTED   Escherichia coli NOT DETECTED NOT DETECTED   Klebsiella oxytoca NOT DETECTED NOT DETECTED   Klebsiella pneumoniae NOT DETECTED NOT DETECTED   Proteus species NOT DETECTED NOT DETECTED   Serratia marcescens NOT DETECTED NOT DETECTED   Haemophilus influenzae NOT DETECTED NOT DETECTED   Neisseria meningitidis NOT DETECTED NOT DETECTED   Pseudomonas aeruginosa NOT DETECTED NOT DETECTED   Candida albicans NOT DETECTED NOT DETECTED   Candida glabrata NOT DETECTED NOT  DETECTED   Candida krusei NOT DETECTED NOT DETECTED   Candida parapsilosis NOT DETECTED NOT DETECTED   Candida tropicalis NOT DETECTED NOT DETECTED    Caryl Pina 10/07/2018  2:15 AM

## 2018-10-07 NOTE — Progress Notes (Signed)
FPTS Interim Progress Note  Went to see my patient [name] this morning during her hospitalization for L2-L3 discitis and possible osteomyelitis.  She says that her pain is well controlled when she is still but she will have shooting pains down her back if she moves.  She is also not sure if she is getting any procedure done today since she was told that she would need to wait 48 hours after Eliquis for her procedure.  I told her that since she was n.p.o. at midnight, she may get the area aspirated today.  I also reminded her of her PRN pain medications and encouraged her to take them if her pain was not manageable.  We discussed that we would also make sure an order for compression stockings was placed since patient is experiencing significant swelling in her lower extremities.  Adanely Reynoso C. Shan Levans, MD PGY-2, [facility] 10/07/2018 8:33 AM

## 2018-10-07 NOTE — Progress Notes (Signed)
Pt declined CPAP- machine at bedside for pt use if she changes her mind

## 2018-10-07 NOTE — Progress Notes (Signed)
  Unable to proceed with disc aspiration today.  Dr. Estanislado Pandy would like 2 full days off Eliquis and INR is 1.54 and PTT is 18.  Will make NPO after MN and check INR again in am and plan for disc aspiration tomorrow.  Julie-Ann Vanmaanen S Marriah Sanderlin PA-C 10/07/2018 10:45 AM

## 2018-10-07 NOTE — Plan of Care (Signed)
  Problem: Safety: Goal: Ability to remain free from injury will improve Outcome: Progressing   

## 2018-10-07 NOTE — Progress Notes (Signed)
Patient was transported to IR  

## 2018-10-07 NOTE — Progress Notes (Addendum)
Family Medicine Teaching Service Daily Progress Note Intern Pager: [phone]  Patient name: [name] Medical record number: [id number] Date of birth: [birth year] Age: 77 y.o. Gender: female  Primary Care Provider: Kathrene Alu, MD Consultants: ID, IR Code Status: Limited (DNI, confirmed on admission)  Chief Complaint: back pain  Assessment and Plan: [name] is a 77 y.o. female presenting with back pain x3 weeks. PMH is significant for T2DM, HFrEF (35-40%), CKD3, HTN, HLD, OSA on CPAP, afib on Eliquis, h/o CVA 2017, CAD s/p CABG 2009, morbid obesity.  Back Pain 2/2 L2-L3 Discitis and possible osteomyelitis MRI showing edema of surrounding discs, vertebrae, and soft tissues surrounding L2-L3 without drainable abscess. No fevers, WBC count. ESR elevated. CRP 10.2. INR 1.54. - s/p Vanc x1 (11/25), plan to restart Vanc and CTX -Neurosurgery consult regarding need for intervention - appreciate recs; no intervention needed - IR consulted for biopsy - Biopsy 11/26  - ID consulted, appreciate recs - patient without systemic symptoms, hold Rocephin (and Eliquis) until after biopsy  - oxycodone 2.5 vs '5mg'$  q6h PRN pain - phenergan 12.'5mg'$  q6h PRN nausea - am CBC, BMP - Blood cultures showing Strep - repeat 11/27 - Vitals per unit routine - Plan to make NPO prior to biopsy (likely 48hrs per  IR)  Type 2 Diabetes, well controlled: Last A1c 6.1 08/05/18. Lantus 17u injects in lower abdomen without missed doses or signs of infection/irritation. - Lantus 9u - sSSI - CBG AC, qhs   Afib On Apixaban, last dose yesterday. Follows with [facility]. In afib on admission. - Holding Eliquis until after biopsy and consult with NeuroSurg  HTN: Low on admission, 110/58. On Coreg, losartan, spironolactone, torsemide at home. - continue coreg, torsemide - hold losartan and spiro given AKI.  AKI on CKDIII Cr. 2.41 > 2.27 11/26, baseline ~1.5-1.8. Followed by Dr. Audie Clear with [facility].  On torsemide with recent dose decrease by Nephro 11/18. Fluid overloaded on exam, 3+ pitting edema to knees. Given overall vital sign stability and fluid overload, will continue to diuresis without addition of IVF.  - continue torsemide given LE edema - hold losartan - am BMP - Compression stockings  Anemia of CKD Hb 10.3 > 9.7 (baseline ~10-11). S/p Feraheme infusions in August. Continues on PO iron.  - continue home PO iron.  HFrEF, CAD s/p CABG, stable Ischemic cardiomyopathy with prominent RV failure. On torsemide, spironolactone, coreg. [facility]s with [facility], Dr. Angelena Form. ECHO 11/18 with LVEF 35-40%.  - continue torsemide given fluid overload - continue spironolactone, coreg.  HLD On lipitor at home - continue home med.  Restless leg syndrome: patient takes Ropinirole -Continue.  H/o CVA 2017 No new neurologic deficits.  OSA on CPAP Hasn't used CPAP recently due to it "not working right." Had sleep study done recently. - CPAP qhs  FEN/GI: Heart healthy/Carb modified Prophylaxis: SCDs  Disposition: Home pending medical clearance  Subjective:  Patient seen this morning lying in bed, states she feels well and only has mild back pain when she moves.  She denies shortness of breath, chest pain, nausea/vomiting, and new aches or pains.  She states she is not wearing her compression stockings this morning because the pain was too bad to put them on before she came to the hospital and her legs are swollen.  Objective: Temp:  [98.7 F (37.1 C)-99.8 F (37.7 C)] 98.8 F (37.1 C) (11/26 0348) Pulse Rate:  [56-81] 66 (11/26 0348) Resp:  [16-18] 16 (11/26 0348) BP: (87-115)/(38-59) 110/58 (  11/26 0348) SpO2:  [96 %-100 %] 98 % (11/26 0348) Weight:  [96.8 kg] 96.8 kg (11/26 0500) Physical Exam: General: Lying in bed, in no acute distress Cardiovascular: Irregularly irregular rhythm, rate WNL, S1-S2 present, no murmurs, rubs, gallops Respiratory: CTA B Abdomen: Bowel  sounds auscultated, soft, nontender to palpation Extremities: Tender, warm, bilateral lower extremity 2+ pitting edema with erythema L>R 2/2 venous stasis  Laboratory: Recent Labs  Lab 10/06/18 0758 10/07/18 0443  WBC 7.5 7.0  HGB 10.3* 9.7*  HCT 33.0* 29.9*  PLT 169 141*   Recent Labs  Lab 10/06/18 0758 10/07/18 0443  NA 135 134*  K 4.7 4.5  CL 99 102  CO2 29 24  BUN 103* 96*  CREATININE 2.41* 2.27*  CALCIUM 10.0 9.8  GLUCOSE 151* 175*   Imaging/Diagnostic Tests: Dg Lumbar Spine Complete  Result Date: 09/18/2018 CLINICAL DATA:  Pt c/o left side low back pain x 6 days. No hx of fall. Pt states she was diagnosed with UTI on Monday by an Urgent Care. Hx of CAD, CKD, CHF, diabetes, HTN. *Tech to call report* EXAM: LUMBAR SPINE - COMPLETE 4+ VIEW COMPARISON:  CT of the abdomen and pelvis on 07/04/2018 FINDINGS: There are significant degenerative changes throughout the LOWER thoracic and lumbar spine. Significant facet hypertrophy, disc height loss and uncovertebral spurring at multiple levels. No spondylolisthesis. No evidence for acute fracture. Visualized bowel gas pattern is nonobstructive. There is moderate stool burden. Atherosclerotic calcification of the abdominal aorta. IMPRESSION: Thoraco lumbar spondylosis. No evidence for acute abnormality. Aortic atherosclerosis.  (ICD10-I70.0) Electronically Signed   By: Nolon Nations M.D.   On: 09/18/2018 11:21   Mr Lumbar Spine Wo Contrast  Result Date: 10/06/2018 CLINICAL DATA:  77 year old female with back pain x4 weeks, severe this morning with bilateral leg pain. Diabetes, chronic kidney disease. EXAM: MRI LUMBAR SPINE WITHOUT CONTRAST TECHNIQUE: Multiplanar, multisequence MR imaging of the lumbar spine was performed. No intravenous contrast was administered. COMPARISON:  Lumbar radiographs 09/18/2018. CT Abdomen and Pelvis 07/04/2018, lumbar MRI 04/02/2006. FINDINGS: Segmentation:  Normal. Alignment: Chronic mild dextroconvex  lumbar scoliosis with straightening of lordosis. Stable alignment since August. Vertebrae: Confluent marrow edema in both the L2 and L3 vertebral bodies associated with abnormal signal throughout the intervening disc space and prevertebral/left greater than right paraspinal soft tissue edema and inflammation. No. Similar marrow edema elsewhere background bone marrow signal is within normal limits. Intact visible sacrum and SI joints. Conus medullaris and cauda equina: Conus extends to the L1 level. No lower spinal cord or conus signal abnormality. Paraspinal and other soft tissues: Negative visible abdominal viscera. Pelvic viscera remarkable for mildly distended urinary bladder. Left greater than right paraspinal soft tissue inflammation centered at the L2-L3 level (series 10, image 13) including edema in the psoas muscles and prevertebral soft tissue stranding (series 7, image 7). Possible developing small intramuscular fluid collection along the anterior left psoas at that level (series 8, image 11), 10-12 millimeters. This does not appear drainable. Left foraminal inflammation. Mild bilateral inflammation about the posterior elements. Mild heterogeneity in the bilateral lumbar erector spinae muscles otherwise. Disc levels: Flowing osteophytes resulting in ankylosis throughout the lower thoracic spine to the L1-L2 level demonstrated on the August CT. L2-L3: Loss of anterior vacuum disc at this level since August. Increased T2 and STIR signal within the disc space suspicious for fluid with bulky left eccentric circumferential disc osteophyte complex, bulky far lateral involvement. Moderate to severe facet and ligament flavum hypertrophy. Moderate spinal stenosis with  up to severe left lateral recess (left L3 nerve level) and left L2 neural foraminal stenosis. L3-L4: Disc desiccation with bulky left eccentric circumferential disc osteophyte complex, severe facet and ligament flavum hypertrophy. Moderate to severe  spinal stenosis with moderate left and mild right L3 foraminal stenosis. L4-L5: Bulky right eccentric circumferential disc osteophyte complex with moderate facet and ligament flavum hypertrophy. Moderate to severe right lateral recess stenosis (right L5 nerve level) and right L4 foraminal stenosis. Borderline to mild spinal and left foraminal stenosis. L5-S1: Chronic disc space loss and circumferential disc osteophyte complex. Vacuum disc is also no longer evident here. Moderate facet hypertrophy. No spinal stenosis. Mild to moderate bilateral lateral recess and neural foraminal stenosis (bilateral L5 and S1 nerve levels). IMPRESSION: 1. Abnormal L2-L3 disc, vertebrae, and surrounding soft tissues most compatible with Acute Discitis Osteomyelitis. Confluent edema in both vertebrae and the surrounding paraspinal soft tissues greater on the left. No drainable fluid collection or abscess at this time, although attention directed to the left paraspinal phlegmon on follow-up imaging. Underlying multifactorial moderate to severe spinal, left lateral recess, and left foraminal stenosis. 2. Chronic DISH related ankylosis from the lower thoracic spine to the L2 level. L3 through S1 spinal degeneration with moderate to severe spinal stenosis at L3-L4, moderate to severe right side lateral recess and foraminal stenosis at L4-L5. Electronically Signed   By: Genevie Ann M.D.   On: 10/06/2018 07:06   Dg Chest Port 1 View  Result Date: 10/06/2018 CLINICAL DATA:  Spine infection EXAM: PORTABLE CHEST 1 VIEW COMPARISON:  06/24/2008 FINDINGS: Cardiac shadow is at the upper limits of normal in size. Postsurgical changes are noted. The lungs are well aerated bilaterally without focal infiltrate or sizable effusion. Mild degenerative changes of thoracic spine are noted. IMPRESSION: No acute abnormality seen. Electronically Signed   By: Inez Catalina M.D.   On: 10/06/2018 12:35    Daisy Floro, DO 10/07/2018, 9:47 AM PGY-1,  [place] Intern pager: [phone], text pages welcome

## 2018-10-08 ENCOUNTER — Ambulatory Visit: Payer: Medicare Other

## 2018-10-08 ENCOUNTER — Inpatient Hospital Stay (HOSPITAL_COMMUNITY): Payer: Medicare Other

## 2018-10-08 ENCOUNTER — Ambulatory Visit: Payer: Medicare Other | Admitting: Family Medicine

## 2018-10-08 DIAGNOSIS — Z7901 Long term (current) use of anticoagulants: Secondary | ICD-10-CM

## 2018-10-08 DIAGNOSIS — I4819 Other persistent atrial fibrillation: Secondary | ICD-10-CM

## 2018-10-08 HISTORY — PX: IR LUMBAR DISC ASPIRATION W/IMG GUIDE: IMG5306

## 2018-10-08 LAB — GLUCOSE, CAPILLARY
GLUCOSE-CAPILLARY: 130 mg/dL — AB (ref 70–99)
GLUCOSE-CAPILLARY: 219 mg/dL — AB (ref 70–99)
Glucose-Capillary: 152 mg/dL — ABNORMAL HIGH (ref 70–99)
Glucose-Capillary: 166 mg/dL — ABNORMAL HIGH (ref 70–99)

## 2018-10-08 LAB — CBC
HEMATOCRIT: 32.2 % — AB (ref 36.0–46.0)
HEMOGLOBIN: 10.2 g/dL — AB (ref 12.0–15.0)
MCH: 30.3 pg (ref 26.0–34.0)
MCHC: 31.7 g/dL (ref 30.0–36.0)
MCV: 95.5 fL (ref 80.0–100.0)
Platelets: 178 10*3/uL (ref 150–400)
RBC: 3.37 MIL/uL — AB (ref 3.87–5.11)
RDW: 13.2 % (ref 11.5–15.5)
WBC: 7.7 10*3/uL (ref 4.0–10.5)
nRBC: 0 % (ref 0.0–0.2)

## 2018-10-08 LAB — BASIC METABOLIC PANEL
Anion gap: 7 (ref 5–15)
BUN: 84 mg/dL — AB (ref 8–23)
CHLORIDE: 103 mmol/L (ref 98–111)
CO2: 26 mmol/L (ref 22–32)
CREATININE: 1.86 mg/dL — AB (ref 0.44–1.00)
Calcium: 9.8 mg/dL (ref 8.9–10.3)
GFR calc non Af Amer: 25 mL/min — ABNORMAL LOW (ref >60)
GFR, EST AFRICAN AMERICAN: 29 mL/min — AB (ref >60)
Glucose, Bld: 157 mg/dL — ABNORMAL HIGH (ref 70–99)
Potassium: 3.8 mmol/L (ref 3.5–5.1)
Sodium: 136 mmol/L (ref 135–145)

## 2018-10-08 LAB — PROTIME-INR
INR: 1.41
Prothrombin Time: 17.1 seconds — ABNORMAL HIGH (ref 11.4–15.2)

## 2018-10-08 LAB — HEPATITIS C ANTIBODY: HCV Ab: <0.1 s/co ratio (ref 0.0–0.9)

## 2018-10-08 MED ORDER — BUPIVACAINE HCL (PF) 0.5 % IJ SOLN
INTRAMUSCULAR | Status: AC
  Filled 2018-10-08: qty 30

## 2018-10-08 MED ORDER — FENTANYL CITRATE (PF) 100 MCG/2ML IJ SOLN
INTRAMUSCULAR | Status: AC
  Filled 2018-10-08: qty 2

## 2018-10-08 MED ORDER — TORSEMIDE 20 MG PO TABS
60.0000 mg | ORAL_TABLET | Freq: Once | ORAL | Status: AC
  Administered 2018-10-08: 60 mg via ORAL
  Filled 2018-10-08: qty 3

## 2018-10-08 MED ORDER — MIDAZOLAM HCL 2 MG/2ML IJ SOLN
INTRAMUSCULAR | Status: AC | PRN
  Administered 2018-10-08: 1 mg via INTRAVENOUS

## 2018-10-08 MED ORDER — FENTANYL CITRATE (PF) 100 MCG/2ML IJ SOLN
INTRAMUSCULAR | Status: AC | PRN
  Administered 2018-10-08: 25 ug via INTRAVENOUS

## 2018-10-08 MED ORDER — MIDAZOLAM HCL 2 MG/2ML IJ SOLN
INTRAMUSCULAR | Status: AC
  Filled 2018-10-08: qty 2

## 2018-10-08 MED ORDER — SODIUM CHLORIDE 0.9 % IV SOLN
640.0000 mg | Freq: Every day | INTRAVENOUS | Status: DC
  Administered 2018-10-08 – 2018-10-12 (×5): 640 mg via INTRAVENOUS
  Filled 2018-10-08 (×6): qty 12.8

## 2018-10-08 MED ORDER — WHITE PETROLATUM EX OINT
TOPICAL_OINTMENT | CUTANEOUS | Status: AC
  Administered 2018-10-08: 23:00:00
  Filled 2018-10-08: qty 28.35

## 2018-10-08 MED ORDER — SODIUM CHLORIDE 0.9 % IV SOLN
2.0000 g | INTRAVENOUS | Status: DC
  Administered 2018-10-08 – 2018-10-12 (×5): 2 g via INTRAVENOUS
  Filled 2018-10-08 (×6): qty 20

## 2018-10-08 MED ORDER — CARVEDILOL 6.25 MG PO TABS
6.2500 mg | ORAL_TABLET | Freq: Two times a day (BID) | ORAL | Status: DC
  Administered 2018-10-09 – 2018-10-14 (×11): 6.25 mg via ORAL
  Filled 2018-10-08 (×11): qty 1

## 2018-10-08 NOTE — Sedation Documentation (Signed)
Patient is resting comfortably. 

## 2018-10-08 NOTE — Procedures (Signed)
S/P  L2/L3 disc aspiration with fluoro guidence

## 2018-10-08 NOTE — Progress Notes (Signed)
Family Medicine Teaching Service Daily Progress Note Intern Pager: [phone]  Patient name: [name] Medical record number: [id number] Date of birth: Apr 11, 1941 Age: 77 y.o. Gender: female  Primary Care Provider: Kathrene Alu, MD Consultants:ID, IR, Neurosurgery curbsided Code Status:Limited (DNI, confirmed on admission)  Chief Complaint:Back pain  Assessment and Plan: [name] a 77 y.o.femalepresenting with back pain x3 weeks. PMH is significant forT2DM, HFrEF (35-40%), CKD3, HTN, HLD, OSA on CPAP, afib on Eliquis, h/o CVA 2017, CAD s/p CABG 2009, morbid obesity.  BackPain 2/2 L2-L3 Discitis and possible osteomyelitis MRI showing edema of surrounding discs, vertebrae, and soft tissues surrounding L2-L3 without drainable abscess. No fevers, WBC count.ESR elevated.CRP 10.2. INR 1.54. Afebrile ON. Received one Oxycodone 36m at 2142. - s/p Vanc x1 (11/25), restart Vanc and CTX s/p procedure -Neurosurgery - no intervention needed - IR consulted for biopsy - Biopsy 11/27  - ID consulted, appreciate recs - patient without systemic symptoms, hold Rocephin (and Eliquis) until after biopsy  - Oxycodone 2.5 vs [id number] PRN pain - Phenergan [id number] PRN nausea - patient not taking - am CBC, BMP - Blood cultures showing Strep - repeat 11/27 - Vitals per unit routine - NPO prior to biopsy - TTE to evaluate valves for infection 11/27  Type 2 Diabetes, well controlled: Last A1c 6.1 08/05/18. Takes Lantus 17u at home. CBG in 150s. - Lantus 8u qHS - sSSI - CBG AC, qhs  Afib: On Apixaban, last dose 11/25.Follows with [facility]. In afib on admission. - Holding Eliquis until after biopsy  HTN: Low on admission, 98/40. On Coreg, losartan, spironolactone, torsemide at home. - continue coreg (hold PM on 11/27), torsemide - hold losartan and spiro given AKI  AKI onCKDIII, improved: Cr. 2.41 > 2.27>1.86 11/27, baseline ~1.5-1.8.[facility]On torsemide with recent dose decrease by Nephro 11/18. Fluid overloaded on exam, 3+ pitting edema to knees. Given overall vital sign stability and fluid overload, will continue to diuresis without addition of IVF.  - continue torsemide given LE edema - hold losartan - am BMP - Compression stockings  Anemia of CKD Hb 10.3 > 9.7 > 10.2 (baseline ~10-11). S/pFeraheme infusions in August. Continues on PO iron.  - continue home PO iron.  HFrEF, CAD s/p CABG, stable Ischemic cardiomyopathy with prominent RV failure.On torsemide, spironolactone, coreg. [facility]s [facility], Dr. McAAngelena FormCHO 11/18 with LVEF 35-40%. Has gained 2kg today. - continue torsemide given fluid overload - continue spironolactone, coreg. - Daily weights - Re-evaluate Is&Os - Torsemide 60 this PM  HLD On lipitor at home - continue home med.  Restless leg syndrome: patient takes Ropinirole -Continue.  H/o CVA 2017 No new neurologic deficits.  OSA on CPAP Received CPAP last night and didn't want it.  - CPAP qHS recommended  FEN/GI:Heart healthy/Carb modified Prophylaxis:SCDs  Disposition: Home pending medical clearance  Subjective:  Patient resting in bed this morning on her bedpan.  States she has increased back pain right now due to being moved.  Other than feeling hungry, she has no new concerns.  Objective: Temp:  [98.1 F (36.7 C)-99 F (37.2 C)] 98.1 F (36.7 C) (11/27 0620) Pulse Rate:  [60-122] 60 (11/27 0620) Resp:  [14-20] 14 (11/27 0359) BP: (90-146)/(35-127) 98/40 (11/27 0620) SpO2:  [98 %] 98 % (11/27 0359) Weight:  [98.5 kg] 98.5 kg (11/27 0500)  Physical Exam  Constitutional: She is oriented to person, place, and time. She appears well-developed and well-nourished.  Cardiovascular: Normal  rate, regular rhythm and normal heart sounds.  Pulmonary/Chest: Effort normal and breath sounds normal.  Abdominal: Soft. Bowel sounds are normal.  Musculoskeletal: She  exhibits edema (2+ pitting to bilateral lower extremities).  Neurological: She is alert and oriented to person, place, and time.  Skin: Skin is warm. Capillary refill takes less than 2 seconds.  venous stasis to bilateral lower extremities   Laboratory: Recent Labs  Lab 10/06/18 0758 10/07/18 0443  WBC 7.5 7.0  HGB 10.3* 9.7*  HCT 33.0* 29.9*  PLT 169 141*   Recent Labs  Lab 10/06/18 0758 10/07/18 0443  NA 135 134*  K 4.7 4.5  CL 99 102  CO2 29 24  BUN 103* 96*  CREATININE 2.41* 2.27*  CALCIUM 10.0 9.8  GLUCOSE 151* 175*   Imaging/Diagnostic Tests: Dg Lumbar Spine Complete  Result Date: 09/18/2018 CLINICAL DATA:  Pt c/o left side low back pain x 6 days. No hx of fall. Pt states she was diagnosed with UTI on Monday by an Urgent Care. Hx of CAD, CKD, CHF, diabetes, HTN. *Tech to call report* EXAM: LUMBAR SPINE - COMPLETE 4+ VIEW COMPARISON:  CT of the abdomen and pelvis on 07/04/2018 FINDINGS: There are significant degenerative changes throughout the LOWER thoracic and lumbar spine. Significant facet hypertrophy, disc height loss and uncovertebral spurring at multiple levels. No spondylolisthesis. No evidence for acute fracture. Visualized bowel gas pattern is nonobstructive. There is moderate stool burden. Atherosclerotic calcification of the abdominal aorta. IMPRESSION: Thoraco lumbar spondylosis. No evidence for acute abnormality. Aortic atherosclerosis.  (ICD10-I70.0) Electronically Signed   By: Nolon Nations M.D.   On: 09/18/2018 11:21   Mr Lumbar Spine Wo Contrast  Result Date: 10/06/2018 CLINICAL DATA:  77 year old female with back pain x4 weeks, severe this morning with bilateral leg pain. Diabetes, chronic kidney disease. EXAM: MRI LUMBAR SPINE WITHOUT CONTRAST TECHNIQUE: Multiplanar, multisequence MR imaging of the lumbar spine was performed. No intravenous contrast was administered. COMPARISON:  Lumbar radiographs 09/18/2018. CT Abdomen and Pelvis 07/04/2018, lumbar  MRI 04/02/2006. FINDINGS: Segmentation:  Normal. Alignment: Chronic mild dextroconvex lumbar scoliosis with straightening of lordosis. Stable alignment since August. Vertebrae: Confluent marrow edema in both the L2 and L3 vertebral bodies associated with abnormal signal throughout the intervening disc space and prevertebral/left greater than right paraspinal soft tissue edema and inflammation. No. Similar marrow edema elsewhere background bone marrow signal is within normal limits. Intact visible sacrum and SI joints. Conus medullaris and cauda equina: Conus extends to the L1 level. No lower spinal cord or conus signal abnormality. Paraspinal and other soft tissues: Negative visible abdominal viscera. Pelvic viscera remarkable for mildly distended urinary bladder. Left greater than right paraspinal soft tissue inflammation centered at the L2-L3 level (series 10, image 13) including edema in the psoas muscles and prevertebral soft tissue stranding (series 7, image 7). Possible developing small intramuscular fluid collection along the anterior left psoas at that level (series 8, image 11), 10-12 millimeters. This does not appear drainable. Left foraminal inflammation. Mild bilateral inflammation about the posterior elements. Mild heterogeneity in the bilateral lumbar erector spinae muscles otherwise. Disc levels: Flowing osteophytes resulting in ankylosis throughout the lower thoracic spine to the L1-L2 level demonstrated on the August CT. L2-L3: Loss of anterior vacuum disc at this level since August. Increased T2 and STIR signal within the disc space suspicious for fluid with bulky left eccentric circumferential disc osteophyte complex, bulky far lateral involvement. Moderate to severe facet and ligament flavum hypertrophy. Moderate spinal stenosis with up to  severe left lateral recess (left L3 nerve level) and left L2 neural foraminal stenosis. L3-L4: Disc desiccation with bulky left eccentric circumferential disc  osteophyte complex, severe facet and ligament flavum hypertrophy. Moderate to severe spinal stenosis with moderate left and mild right L3 foraminal stenosis. L4-L5: Bulky right eccentric circumferential disc osteophyte complex with moderate facet and ligament flavum hypertrophy. Moderate to severe right lateral recess stenosis (right L5 nerve level) and right L4 foraminal stenosis. Borderline to mild spinal and left foraminal stenosis. L5-S1: Chronic disc space loss and circumferential disc osteophyte complex. Vacuum disc is also no longer evident here. Moderate facet hypertrophy. No spinal stenosis. Mild to moderate bilateral lateral recess and neural foraminal stenosis (bilateral L5 and S1 nerve levels). IMPRESSION: 1. Abnormal L2-L3 disc, vertebrae, and surrounding soft tissues most compatible with Acute Discitis Osteomyelitis. Confluent edema in both vertebrae and the surrounding paraspinal soft tissues greater on the left. No drainable fluid collection or abscess at this time, although attention directed to the left paraspinal phlegmon on follow-up imaging. Underlying multifactorial moderate to severe spinal, left lateral recess, and left foraminal stenosis. 2. Chronic DISH related ankylosis from the lower thoracic spine to the L2 level. L3 through S1 spinal degeneration with moderate to severe spinal stenosis at L3-L4, moderate to severe right side lateral recess and foraminal stenosis at L4-L5. Electronically Signed   By: Genevie Ann M.D.   On: 10/06/2018 07:06   Dg Chest Port 1 View  Result Date: 10/06/2018 CLINICAL DATA:  Spine infection EXAM: PORTABLE CHEST 1 VIEW COMPARISON:  06/24/2008 FINDINGS: Cardiac shadow is at the upper limits of normal in size. Postsurgical changes are noted. The lungs are well aerated bilaterally without focal infiltrate or sizable effusion. Mild degenerative changes of thoracic spine are noted. IMPRESSION: No acute abnormality seen. Electronically Signed   By: Inez Catalina M.D.    On: 10/06/2018 12:35     Daisy Floro, DO 10/08/2018, 8:06 AM PGY-1, [place] Intern pager: [phone], text pages welcome

## 2018-10-08 NOTE — Progress Notes (Signed)
Pharmacy Antibiotic Note  [name] is a 77 y.o. female admitted on 10/06/2018 with possible lumbar discitis.  Pharmacy has been consulted for daptomycin dosing. Patient is now s/p disc aspiration so will start antibiotics this evening. Patient received Vancomycin 1 gm in the ED on 11/25 but has otherwise been off antibiotics while awaiting aspiration. WBC WNL and patient afebrile. SCr down to 1.86 (Baseline: 1.5-1.6).   Plan: Daptomycin 640 mg ~ 8 mg/kg AdjBW Weekly CK on Thursdays Monitor aspiration cultures   Weight: 217 lb 2.5 oz (98.5 kg)  Temp (24hrs), Avg:98.6 F (37 C), Min:98.1 F (36.7 C), Max:99 F (37.2 C)  Recent Labs  Lab 10/06/18 0758 10/07/18 0443 10/08/18 0843  WBC 7.5 7.0 7.7  CREATININE 2.41* 2.27* 1.86*    Estimated Creatinine Clearance: 30.6 mL/min (A) (by C-G formula based on SCr of 1.86 mg/dL (H)).    Allergies  Allergen Reactions  . Benazepril Other (See Comments)    Unknown reaction at age 33-65 - possibly dizziness  . Angiotensin Receptor Blockers Other (See Comments)    Hypotension reaction  . Fe-Succ-C-Thre-B12-Des Stomach Other (See Comments)    Unknown reaction  . Sulfamethoxazole-Trimethoprim Other (See Comments)    Unknown reaction    Antimicrobials this admission: 11/27 Ceftriaxone>> 11/27 Dapto >>   Microbiology results: 11/27 disc aspiration:   Thank you for allowing pharmacy to be a part of this patient's care.  Susa Raring, PharmD, [facility] Clinical Pharmacist Phone: [phone] 10/08/2018 1:47 PM

## 2018-10-08 NOTE — Progress Notes (Signed)
ID PROGRESS NOTE  Ms Swint underwent IR guided disc aspiration. We will plan to start on ceftriaxone and vancomycin for presumed culture negative discitis. We will follow culture results over the next 48hrs to determine what her final antibiotic regimen will be.  Dr Lucianne Lei dam to see over the next 2 days for final recs.  Elzie Rings [facility] [phone]

## 2018-10-09 ENCOUNTER — Inpatient Hospital Stay (HOSPITAL_COMMUNITY): Payer: Medicare Other

## 2018-10-09 DIAGNOSIS — M4640 Discitis, unspecified, site unspecified: Secondary | ICD-10-CM

## 2018-10-09 DIAGNOSIS — I362 Nonrheumatic tricuspid (valve) stenosis with insufficiency: Secondary | ICD-10-CM

## 2018-10-09 LAB — BASIC METABOLIC PANEL
Anion gap: 9 (ref 5–15)
BUN: 71 mg/dL — ABNORMAL HIGH (ref 8–23)
CALCIUM: 9.7 mg/dL (ref 8.9–10.3)
CO2: 27 mmol/L (ref 22–32)
CREATININE: 1.61 mg/dL — AB (ref 0.44–1.00)
Chloride: 101 mmol/L (ref 98–111)
GFR calc non Af Amer: 30 mL/min — ABNORMAL LOW (ref >60)
GFR, EST AFRICAN AMERICAN: 35 mL/min — AB (ref >60)
Glucose, Bld: 177 mg/dL — ABNORMAL HIGH (ref 70–99)
Potassium: 3.4 mmol/L — ABNORMAL LOW (ref 3.5–5.1)
Sodium: 137 mmol/L (ref 135–145)

## 2018-10-09 LAB — GLUCOSE, CAPILLARY
GLUCOSE-CAPILLARY: 267 mg/dL — AB (ref 70–99)
Glucose-Capillary: 159 mg/dL — ABNORMAL HIGH (ref 70–99)
Glucose-Capillary: 236 mg/dL — ABNORMAL HIGH (ref 70–99)
Glucose-Capillary: 234 mg/dL — ABNORMAL HIGH (ref 70–99)

## 2018-10-09 LAB — CK: Total CK: 31 U/L — ABNORMAL LOW (ref 38–234)

## 2018-10-09 LAB — CBC
HCT: 32.2 % — ABNORMAL LOW (ref 36.0–46.0)
Hemoglobin: 10.5 g/dL — ABNORMAL LOW (ref 12.0–15.0)
MCH: 30.9 pg (ref 26.0–34.0)
MCHC: 32.6 g/dL (ref 30.0–36.0)
MCV: 94.7 fL (ref 80.0–100.0)
NRBC: 0 % (ref 0.0–0.2)
PLATELETS: 176 10*3/uL (ref 150–400)
RBC: 3.4 MIL/uL — ABNORMAL LOW (ref 3.87–5.11)
RDW: 13.1 % (ref 11.5–15.5)
WBC: 7.9 10*3/uL (ref 4.0–10.5)

## 2018-10-09 LAB — ECHOCARDIOGRAM COMPLETE: Weight: 3386.27 oz

## 2018-10-09 LAB — CULTURE, BLOOD (ROUTINE X 2): Special Requests: ADEQUATE

## 2018-10-09 MED ORDER — HYDROCORTISONE ACE-PRAMOXINE 1-1 % RE CREA
TOPICAL_CREAM | Freq: Three times a day (TID) | RECTAL | Status: DC
  Administered 2018-10-09: 17:00:00 via RECTAL
  Administered 2018-10-09: 1 via RECTAL
  Filled 2018-10-09 (×2): qty 30

## 2018-10-09 MED ORDER — APIXABAN 5 MG PO TABS
5.0000 mg | ORAL_TABLET | Freq: Two times a day (BID) | ORAL | Status: DC
  Administered 2018-10-10 – 2018-10-14 (×9): 5 mg via ORAL
  Filled 2018-10-09 (×9): qty 1

## 2018-10-09 MED ORDER — TORSEMIDE 20 MG PO TABS
60.0000 mg | ORAL_TABLET | Freq: Every day | ORAL | Status: DC
  Administered 2018-10-09 – 2018-10-14 (×6): 60 mg via ORAL
  Filled 2018-10-09 (×6): qty 3

## 2018-10-09 NOTE — Plan of Care (Signed)
  Problem: Activity: Goal: Risk for activity intolerance will decrease Outcome: Progressing   Problem: Coping: Goal: Level of anxiety will decrease Outcome: Progressing   Problem: Elimination: Goal: Will not experience complications related to bowel motility Outcome: Progressing   Problem: Pain Managment: Goal: General experience of comfort will improve Outcome: Progressing   Problem: Safety: Goal: Ability to remain free from injury will improve Outcome: Progressing   

## 2018-10-09 NOTE — Plan of Care (Signed)
  Problem: Pain Managment: Goal: General experience of comfort will improve Outcome: Progressing   Problem: Safety: Goal: Ability to remain free from injury will improve Outcome: Progressing   Problem: Elimination: Goal: Will not experience complications related to bowel motility Outcome: Progressing   Problem: Coping: Goal: Level of anxiety will decrease Outcome: Progressing   Problem: Activity: Goal: Risk for activity intolerance will decrease Outcome: Progressing

## 2018-10-09 NOTE — Evaluation (Signed)
Physical Therapy Evaluation Patient Details Name: [name] MRN: [id number] DOB: Dec 27, 1940 Today's Date: 10/09/2018   History of Present Illness  Pt is a 77 y/o female admitted secondary to increased back pain. Found to have L2-3 discitis with possible osteomyelitis. Pt is s/p IR disc aspiration. PMH includes CHF, a fib, HTN, R TKA, CKD, OSA on CPAP, and CVA.   Clinical Impression  Pt admitted secondary to problem above with deficits below. Mobility limited to bed mobility for cleanup following small BM as pt with increased pain. Required mod to max A to roll from side to side. Reviewed back precautions with pt. Feel pt will require increased assist at d/c and is at increased risk for falls. Will continue to follow acutely to maximize functional mobility independence and safety.     Follow Up Recommendations SNF;Supervision/Assistance - 24 hour    Equipment Recommendations  None recommended by PT    Recommendations for Other Services       Precautions / Restrictions Precautions Precautions: Back Precaution Booklet Issued: No Precaution Comments: Reviewed back precautions with pt.  Restrictions Weight Bearing Restrictions: No      Mobility  Bed Mobility Overal bed mobility: Needs Assistance Bed Mobility: Rolling Rolling: Mod assist;Max assist         General bed mobility comments: Mod A to roll to the L and max A to roll to the R. Rolled from side to side, multiple times for clean up and linen change. Pt with increased pain, so requesting to defer further mobility.   Transfers                    Ambulation/Gait                Stairs            Wheelchair Mobility    Modified Rankin (Stroke Patients Only)       Balance                                             Pertinent Vitals/Pain Pain Assessment: 0-10 Pain Score: 8  Pain Location: back Pain Descriptors / Indicators: Aching;Grimacing;Guarding Pain  Intervention(s): Monitored during session;Limited activity within patient's tolerance;Repositioned    Home Living Family/patient expects to be discharged to:: Private residence Living Arrangements: Children(son) Available Help at Discharge: Family;Available 24 hours/day Type of Home: House Home Access: Ramped entrance     Home Layout: One level Home Equipment: Shower seat;Cane - single point;Walker - 2 wheels      Prior Function Level of Independence: Needs assistance   Gait / Transfers Assistance Needed: Uses RW for mobility  ADL's / Homemaking Assistance Needed: Aide comes 2 days a week to help with bathing tasks and dressing tasks.         Hand Dominance        Extremity/Trunk Assessment   Upper Extremity Assessment Upper Extremity Assessment: Defer to OT evaluation    Lower Extremity Assessment Lower Extremity Assessment: Generalized weakness;RLE deficits/detail RLE Deficits / Details: Limited knee ROM secondary to knee pain     Cervical / Trunk Assessment Cervical / Trunk Assessment: Other exceptions Cervical / Trunk Exceptions: lumbar discitis  Communication   Communication: No difficulties  Cognition Arousal/Alertness: Awake/alert Behavior During Therapy: WFL for tasks assessed/performed Overall Cognitive Status: Within Functional Limits for tasks assessed  General Comments General comments (skin integrity, edema, etc.): Pt's son present at beginning of session.     Exercises     Assessment/Plan    PT Assessment Patient needs continued PT services  PT Problem List Decreased strength;Decreased balance;Decreased activity tolerance;Decreased mobility;Decreased knowledge of use of DME;Decreased knowledge of precautions;Pain       PT Treatment Interventions DME instruction;Gait training;Functional mobility training;Therapeutic activities;Therapeutic exercise;Balance training;Patient/family education     PT Goals (Current goals can be found in the Care Plan section)  Acute Rehab PT Goals Patient Stated Goal: to get stronger before going home  PT Goal Formulation: With patient Time For Goal Achievement: 10/23/18 Potential to Achieve Goals: Fair    Frequency Min 2X/week   Barriers to discharge        Co-evaluation               AM-PAC PT "6 Clicks" Mobility  Outcome Measure Help needed turning from your back to your side while in a flat bed without using bedrails?: A Lot Help needed moving from lying on your back to sitting on the side of a flat bed without using bedrails?: A Lot Help needed moving to and from a bed to a chair (including a wheelchair)?: A Lot Help needed standing up from a chair using your arms (e.g., wheelchair or bedside chair)?: A Lot Help needed to walk in hospital room?: Total Help needed climbing 3-5 steps with a railing? : Total 6 Click Score: 10    End of Session   Activity Tolerance: Patient limited by pain Patient left: in bed;with call bell/phone within reach;with bed alarm set Nurse Communication: Mobility status PT Visit Diagnosis: Other abnormalities of gait and mobility (R26.89);Difficulty in walking, not elsewhere classified (R26.2);Muscle weakness (generalized) (M62.81);Pain Pain - part of body: (back)    Time: 6754-4920 PT Time Calculation (min) (ACUTE ONLY): 23 min   Charges:   PT Evaluation $PT Eval Moderate Complexity: 1 Mod PT Treatments $Therapeutic Activity: 8-22 mins        Leighton Ruff, PT, DPT  Acute Rehabilitation Services  Pager: [phone] Office: [phone]   Rudean Hitt 10/09/2018, 11:38 AM

## 2018-10-09 NOTE — Progress Notes (Signed)
[facility]  Met with Pt and son to discuss possible home IV ABX.  Pt and son state pt is planned to DC to a SNF, [facility] in [place] is preferred. Will follow until DC to ensure of plan.  If patient discharges after hours, please call [phone].   Larry Sierras 10/09/2018, 9:46 AM

## 2018-10-09 NOTE — Plan of Care (Signed)

## 2018-10-09 NOTE — Progress Notes (Signed)
  Echocardiogram 2D Echocardiogram has been performed.  Jannett Celestine 10/09/2018, 2:59 PM

## 2018-10-09 NOTE — Progress Notes (Addendum)
Family Medicine Teaching Service Daily Progress Note Intern Pager: [phone]  Patient name: [name] Medical record number: [id number] Date of birth: 11/18/1940 Age: 77 y.o. Gender: female  Primary Care Provider: Kathrene Alu, MD Consultants:ID, IR, Neurosurgery curbsided Code Status:Limited (DNI, confirmed on admission)  Chief Complaint:Back pain  Assessment and Plan: [name] a 77 y.o.femalepresenting with back pain x3 weeks. PMH is significant forT2DM, HFrEF (35-40%), CKD3, HTN, HLD, OSA on CPAP, afib on Eliquis, h/o CVA 2017, CAD s/p CABG 2009, morbid obesity.  BackPain 2/2 L2-L3 Discitis and possible osteomyelitis, day 1 s/p biopsy: MRIshowingedemaofsurrounding discs, vertebrae, and soft tissues surrounding L2-L3 without drainable abscess.No fevers, WBC count.ESR elevated.CRP 10.2. INR 1.54. CK 31. - s/p Vanc x1 (11/25), restarted CTX and Daptomycin s/p procedure - ID consulted, appreciate recs- CTX and Dapto - Oxycodone2.5 vs25m q6h PRN pain - Phenergan 12.564mq6h PRN nausea - patient not taking - am CBC, BMP -Blood culturesshowing Strep viridans; repeat 11/27 pending - TTE to evaluate valves for infection - ordered  Type 2 Diabetes, well controlled:Last A1c 6.1 08/05/18. Takes Lantus 17u at home. CBG in 150s. - Lantus 8u qHS - sSSI - CBG AC, qhs  Afib: On Apixaban, last dose 11/25.[facility] with [facility]. In afib on admission. - Holding Eliquis - restart 11/29  HTN:Low on admission,120/55 11/28.On Coreg, losartan, spironolactone, torsemide at home. - continue coreg (held PM on 11/27), torsemide  - hold losartanand spirogiven AKI  AKI onCKDIII, improved: Cr. 2.41>2.27>1.86>1.61 11/28, baseline ~1.5-1.8.[facility].On torsemide with recent dose decrease by Nephro 11/18. Fluid overloaded on exam, 3+ pitting edema to knees. Given overall vital sign stability and fluid overload, will continue to  diuresis without addition of IVF.  - continue torsemide given LE edema - hold losartan - am BMP - Compression stockings  Anemia of CKD, stable: Hb 10.3>9.7 > 10.2>10.5(baseline ~10-11). S/pFeraheme infusions in August. Continues on PO iron.  - hold iron in setting of acute infection  HFrEF, CAD s/p CABG, stable: Ischemic cardiomyopathy w/ prominent RV failure.On torsemide, spironolactone, coreg. [facility]s [facility], Dr. McAngelena FormECHO 11/18 with LVEF 35-40%. - continue torsemide, coreg - hold losartan given AKI - Repeat TTE 11/28 - Daily weights - Re-evaluate Is&Os (out 150070m1/27) - Torsemide 60 this PM  HLD: On lipitor at home - continue home med.  Restless leg syndrome: patient takes Ropinirole -Continue.  H/o CVA 2017: No new neurologic deficits.  OSA on CPAP: Received CPAP inpatient and didn't want it.  - CPAP qHS recommended  FEN/GI:Heart healthy/Carb modified Prophylaxis:SCDs  Disposition:Home pending medical clearance  Subjective:  Patient seen this morning resting in bed.  She states her pain is well controlled as long as she is not moved too much.  She denies chest pain, shortness of breath, nausea, vomiting, abdominal pain, and constipation.  She reports her lower extremity swelling is much better with the compression stockings in place.  Objective: Temp:  [98.2 F (36.8 C)-99.5 F (37.5 C)] 99.5 F (37.5 C) (11/28 0629) Pulse Rate:  [64-87] 66 (11/28 0629) Resp:  [14-23] 15 (11/28 0629) BP: (103-130)/(48-55) 120/55 (11/28 0629) SpO2:  [98 %-100 %] 99 % (11/28 0629) Weight:  [96 kg] 96 kg (11/28 0500)  Physical Exam  Constitutional: She is oriented to person, place, and time. She appears well-developed and well-nourished.  Cardiovascular: Normal rate, regular rhythm and normal heart sounds.  Pulmonary/Chest: Effort normal and breath sounds normal.  Abdominal: Soft. Bowel sounds are normal.  Musculoskeletal: She  exhibits edema (trace  to bilateral lower extremities, compression stockings in place).  Neurological: She is alert and oriented to person, place, and time.  Skin: Skin is warm. Capillary refill takes less than 2 seconds.  venous stasis to bilateral lower extremities   Laboratory: Recent Labs  Lab 10/07/18 0443 10/08/18 0843 10/09/18 0426  WBC 7.0 7.7 7.9  HGB 9.7* 10.2* 10.5*  HCT 29.9* 32.2* 32.2*  PLT 141* 178 176   Recent Labs  Lab 10/07/18 0443 10/08/18 0843 10/09/18 0426  NA 134* 136 137  K 4.5 3.8 3.4*  CL 102 103 101  CO2 _0 BUN 96* 84* 71*  CREATININE 2.27* 1.86* 1.61*  CALCIUM 9.8 9.8 9.7  GLUCOSE 175* 157* 177*   Surgical pathology 11/27: pending   Imaging/Diagnostic Tests: No new imaging  Daisy Floro, DO 10/09/2018, 8:01 AM PGY-1, [place] Intern pager: [phone], text pages welcome

## 2018-10-10 ENCOUNTER — Encounter (HOSPITAL_COMMUNITY): Payer: Self-pay | Admitting: Interventional Radiology

## 2018-10-10 LAB — GLUCOSE, CAPILLARY
GLUCOSE-CAPILLARY: 171 mg/dL — AB (ref 70–99)
GLUCOSE-CAPILLARY: 167 mg/dL — AB (ref 70–99)
Glucose-Capillary: 165 mg/dL — ABNORMAL HIGH (ref 70–99)
Glucose-Capillary: 179 mg/dL — ABNORMAL HIGH (ref 70–99)

## 2018-10-10 LAB — BASIC METABOLIC PANEL
ANION GAP: 11 (ref 5–15)
BUN: 58 mg/dL — ABNORMAL HIGH (ref 8–23)
CHLORIDE: 101 mmol/L (ref 98–111)
CO2: 25 mmol/L (ref 22–32)
CREATININE: 1.46 mg/dL — AB (ref 0.44–1.00)
Calcium: 9.5 mg/dL (ref 8.9–10.3)
GFR calc Af Amer: 39 mL/min — ABNORMAL LOW (ref >60)
GFR calc non Af Amer: 34 mL/min — ABNORMAL LOW (ref >60)
Glucose, Bld: 206 mg/dL — ABNORMAL HIGH (ref 70–99)
POTASSIUM: 3 mmol/L — AB (ref 3.5–5.1)
Sodium: 137 mmol/L (ref 135–145)

## 2018-10-10 LAB — CBC
HEMATOCRIT: 34.1 % — AB (ref 36.0–46.0)
HEMOGLOBIN: 11.1 g/dL — AB (ref 12.0–15.0)
MCH: 31 pg (ref 26.0–34.0)
MCHC: 32.6 g/dL (ref 30.0–36.0)
MCV: 95.3 fL (ref 80.0–100.0)
NRBC: 0 % (ref 0.0–0.2)
Platelets: 184 10*3/uL (ref 150–400)
RBC: 3.58 MIL/uL — AB (ref 3.87–5.11)
RDW: 12.7 % (ref 11.5–15.5)
WBC: 8 10*3/uL (ref 4.0–10.5)

## 2018-10-10 MED ORDER — HYDROCORTISONE 2.5 % RE CREA
TOPICAL_CREAM | Freq: Three times a day (TID) | RECTAL | Status: DC
  Administered 2018-10-10 – 2018-10-14 (×12): via RECTAL
  Filled 2018-10-10: qty 28.35

## 2018-10-10 MED ORDER — POTASSIUM CHLORIDE 20 MEQ PO PACK
40.0000 meq | PACK | Freq: Two times a day (BID) | ORAL | Status: AC
  Administered 2018-10-10: 40 meq via ORAL
  Filled 2018-10-10: qty 2

## 2018-10-10 NOTE — Evaluation (Signed)
Occupational Therapy Evaluation Patient Details Name: [name] MRN: [id number] DOB: 08-05-1941 Today's Date: 10/10/2018    History of Present Illness Pt is a 77 y/o female admitted secondary to increased back pain. Found to have L2-3 discitis with possible osteomyelitis. Pt is s/p IR disc aspiration. PMH includes CHF, a fib, HTN, R TKA, CKD, OSA on CPAP, and CVA.    Clinical Impression   Pt is currently mod A +2 for transfers from elevated surfaces. Pt incontinent of bladder and bowel this session requiring rolling for clean up - Pt was able to assist with front peri care. Pt able to recall 3/3 back precautions. Pt will require SNF level care post-acute to maximize safety and independence in ADL and functional transfers. Next session to focus on transfers, and AE for LB ADL.    Follow Up Recommendations  SNF;Supervision/Assistance - 24 hour    Equipment Recommendations  Other (comment)(defer to next venue)    Recommendations for Other Services       Precautions / Restrictions Precautions Precautions: Back Precaution Booklet Issued: No Precaution Comments: Reviewed back precautions with pt.  Restrictions Weight Bearing Restrictions: No      Mobility Bed Mobility Overal bed mobility: Needs Assistance Bed Mobility: Rolling;Sidelying to Sit Rolling: Min assist Sidelying to sit: Mod assist       General bed mobility comments: vc for sequencing and heavy use of bed rails, mod A for trunk elevation  Transfers Overall transfer level: Needs assistance Equipment used: Rolling walker (2 wheeled) Transfers: Sit to/from [facility] Sit to Stand: Mod assist;+2 physical assistance;+2 safety/equipment;From elevated surface Stand pivot transfers: Mod assist;+2 physical assistance;+2 safety/equipment       General transfer comment: assist for boost and balance    Balance Overall balance assessment: Needs assistance Sitting-balance support: Bilateral upper  extremity supported;Feet supported Sitting balance-Leahy Scale: Fair Sitting balance - Comments: able to sit min guard EOB   Standing balance support: Bilateral upper extremity supported Standing balance-Leahy Scale: Poor Standing balance comment: dependent on BUE and external support                           ADL either performed or assessed with clinical judgement   ADL Overall ADL's : Needs assistance/impaired Eating/Feeding: Set up;Sitting;Bed level   Grooming: Set up;Sitting;Brushing hair   Upper Body Bathing: Moderate assistance;Sitting   Lower Body Bathing: Maximal assistance;+2 for physical assistance;+2 for safety/equipment   Upper Body Dressing : Moderate assistance   Lower Body Dressing: Total assistance;Bed level   Toilet Transfer: Moderate assistance;+2 for physical assistance;+2 for safety/equipment;RW Toilet Transfer Details (indicate cue type and reason): from elevated bed, simulated through recliner transfer Holcomb and Hygiene: Maximal assistance;+2 for physical assistance;+2 for safety/equipment;Sit to/from stand Toileting - Clothing Manipulation Details (indicate cue type and reason): Pt able to perform front peri care at bed level, also performed rolling for rear peri care, and rear peri care in standing     Functional mobility during ADLs: Moderate assistance;+2 for physical assistance;Rolling walker General ADL Comments: educated on "BLT - back" precautions and compensatory strategies for ADL     Vision Baseline Vision/History: Wears glasses Patient Visual Report: No change from baseline       Perception     Praxis      Pertinent Vitals/Pain Pain Assessment: 0-10 Pain Score: 8  Pain Location: back Pain Descriptors / Indicators: Aching;Grimacing;Guarding Pain Intervention(s): Monitored during session;Repositioned     Hand Dominance  Right   Extremity/Trunk Assessment Upper Extremity Assessment Upper  Extremity Assessment: Generalized weakness   Lower Extremity Assessment RLE Deficits / Details: Limited knee ROM secondary to knee pain    Cervical / Trunk Assessment Cervical / Trunk Assessment: Other exceptions Cervical / Trunk Exceptions: lumbar discitis   Communication Communication Communication: No difficulties   Cognition Arousal/Alertness: Awake/alert Behavior During Therapy: WFL for tasks assessed/performed Overall Cognitive Status: Within Functional Limits for tasks assessed                                     General Comments       Exercises     Shoulder Instructions      Home Living Family/patient expects to be discharged to:: Private residence Living Arrangements: Children(son) Available Help at Discharge: Family;Available 24 hours/day Type of Home: House Home Access: Ramped entrance     Home Layout: One level     Bathroom Shower/Tub: Occupational psychologist: Handicapped height     Home Equipment: Shower seat;Cane - single point;Walker - 2 wheels          Prior Functioning/Environment Level of Independence: Needs assistance  Gait / Transfers Assistance Needed: Uses RW for mobility ADL's / Homemaking Assistance Needed: Aide comes 2 days a week to help with bathing tasks and dressing tasks.             OT Problem List: Decreased activity tolerance;Impaired balance (sitting and/or standing);Decreased knowledge of use of DME or AE;Decreased knowledge of precautions;Pain      OT Treatment/Interventions: Self-care/ADL training;DME and/or AE instruction;Therapeutic activities;Patient/family education;Balance training    OT Goals(Current goals can be found in the care plan section) Acute Rehab OT Goals Patient Stated Goal: to get stronger before going home  OT Goal Formulation: With patient Time For Goal Achievement: 10/24/18 Potential to Achieve Goals: Good ADL Goals Pt Will Perform Grooming: with modified  independence;sitting Pt Will Perform Lower Body Bathing: with mod assist;with adaptive equipment Pt Will Perform Lower Body Dressing: with adaptive equipment;sit to/from stand;with max assist Pt Will Transfer to Toilet: with mod assist;stand pivot transfer  OT Frequency: Min 2X/week   Barriers to D/C:            Co-evaluation PT/OT/SLP Co-Evaluation/Treatment: Yes Reason for Co-Treatment: To address functional/ADL transfers;For patient/therapist safety PT goals addressed during session: Mobility/safety with mobility;Balance;Proper use of DME OT goals addressed during session: ADL's and self-care;Proper use of Adaptive equipment and DME      AM-PAC OT "6 Clicks" Daily Activity     Outcome Measure Help from another person eating meals?: A Little Help from another person taking care of personal grooming?: A Little Help from another person toileting, which includes using toliet, bedpan, or urinal?: A Lot Help from another person bathing (including washing, rinsing, drying)?: A Lot Help from another person to put on and taking off regular upper body clothing?: A Lot Help from another person to put on and taking off regular lower body clothing?: Total 6 Click Score: 13   End of Session Equipment Utilized During Treatment: Gait belt;Rolling walker Nurse Communication: Mobility status;Precautions  Activity Tolerance: Patient tolerated treatment well Patient left: in chair;with call bell/phone within reach;with chair alarm set  OT Visit Diagnosis: Unsteadiness on feet (R26.81);Other abnormalities of gait and mobility (R26.89);Muscle weakness (generalized) (M62.81);Pain Pain - part of body: (back)  Time: 0160-1093 OT Time Calculation (min): 30 min Charges:  OT General Charges $OT Visit: 1 Visit OT Evaluation $OT Eval Moderate Complexity: [place] OTR/L Acute Rehabilitation Services Pager: [phone] Office: [place] 10/10/2018, 5:02 PM

## 2018-10-10 NOTE — Progress Notes (Signed)
Family Medicine Teaching Service Daily Progress Note Intern Pager: [phone]  Patient name: [name] Medical record number: [id number] Date of birth: 03/16/1941 Age: 77 y.o. Gender: female  Primary Care Provider: Kathrene Alu, MD Consultants:ID, IR, Neurosurgery curbsided Code Status:Limited (DNI, confirmed on admission)  Chief Complaint:Back pain  Assessment and Plan: [name] a 77 y.o.femalepresenting with back pain x3 weeks. PMH is significant forT2DM, HFrEF (35-40%), CKD3, HTN, HLD, OSA on CPAP, afib on Eliquis, h/o CVA 2017, CAD s/p CABG 2009, morbid obesity.  BackPain 2/2 L2-L3 Discitis and possible osteomyelitis, day 1 s/p biopsy: MRIshowingedemaofsurrounding discs, vertebrae, and soft tissues surrounding L2-L3 without drainable abscess.No fevers, WBC count.ESR elevated.CRP 10.2. INR 1.54. CK 31. - s/p Vanc x1 (11/25), restarted CTX and Daptomycins/p procedure - ID consulted, appreciate recs- CTX and Dapto -Oxycodone2.5 vs'5mg'$  q6h PRN pain -Phenergan 12.'5mg'$  q6h PRN nausea- patient not taking - am CBC, BMP -Blood culturesshowing Strep viridans - TTE 11/28 without evidence of vegetation  Type 2 Diabetes, well controlled:Last A1c 6.1 08/05/18.TakesLantus 17uat home.CBG in 150s. - Lantus8uqHS - sSSI - CBG AC, qhs  Afib:On Apixaban, last dose11/25.[facility] with [facility]. In afib on admission. - Eliquis   Hypokalemia: K 3.0 11/29. -Replete with K-packets 39mq BID -Recheck  HTN:Low on admission,115/55 11/29.On Coreg, losartan, spironolactone, torsemide at home. - continue coreg(held PM on 11/27), torsemide  - hold losartanand spirogiven AKI  AKI onCKDIII, improved:Cr. 2.41>>1.4611/29, baseline ~1.5-1.8.[facility]On torsemide with recent dose decrease by Nephro 11/18. Fluid overloaded on exam, 3+ pitting edema to knees. Given overall vital sign stability and fluid overload, will  continue to diuresis without addition of IVF.  - continue torsemide given LE edema - hold losartan - am BMP - Compression stockings  Anemia of CKD, stable: Hb 10.3>>10.5>11.1 11/29(baseline ~10-11). S/pFeraheme infusions in August. Continues on PO iron.  - hold iron in setting of acute infection  HFrEF, CAD s/p CABG, stable: Ischemic cardiomyopathy w/ prominent RV failure.On torsemide, spironolactone, coreg. [facility]s [facility], Dr. MAngelena Form ECHO 11/18 with LVEF 35-40%. - continue torsemide, coreg - hold losartan given AKI - Repeat TTE 11/28 showing EF 45-50% - Daily weights - Re-evaluate Is&Os - Torsemide 60 daily; additional 40 mg at night with output less than 1 L.  HLD: On lipitor at home - continue home med.  Restless leg syndrome: patient takes Ropinirole -Continue.  H/o CVA 2017: No new neurologic deficits.  OSA on CPAP: Received CPAP inpatient and didn't want it. - CPAP qHSrecommended  Pain with stooling: Patient has had a couple days of pain with stooling, most likely due to external hemorrhoids.  Otherwise, bowel movements within normal limits. -Anusol TID PRN Rectal pain  Lift Chair/Upright walker at home: The patient reports having a lift chair at home with a broken motor.  She states it is very difficult to get into and out of the chair because the motor is no longer working.  The patient also has a traditional walker that causes her to bend forward while using it.  Per PT/OT the patient is not allowed to bend forward as she heals.  She is requesting an upright walker. -Follow-up with DME on discharge for new motor/chair as well as upright walker.  FEN/GI:Heart healthy/Carb modified Prophylaxis:SCDs  Disposition:Home pending medical clearance  Subjective:  Patient seen this morning resting in bed.   Objective: Temp:  [98.8 F (37.1 C)-99.2 F (37.3 C)] 99.2 F (37.3 C) (11/29 0700) Pulse Rate:  [63-69] 63 (11/29 1030) Resp:  [  17] 17  (11/28 2113) BP: (110-117)/(52-56) 115/55 (11/29 1030) SpO2:  [98 %] 98 % (11/29 0700)  Physical Exam  Constitutional: She is oriented to person, place, and time. She appears well-developed and well-nourished.  Cardiovascular: Normal rate, regular rhythm and normal heart sounds.  Pulmonary/Chest: Effort normal and breath sounds normal.  Abdominal: Soft. Bowel sounds are normal.  Musculoskeletal: She exhibits edema (trace to bilateral lower extremities, compression stockings in place).  Neurological: She is alert and oriented to person, place, and time.  Skin: Skin is warm. Capillary refill takes less than 2 seconds.  venous stasis to bilateral lower extremities   Laboratory: Recent Labs  Lab 10/08/18 0843 10/09/18 0426 10/10/18 0148  WBC 7.7 7.9 8.0  HGB 10.2* 10.5* 11.1*  HCT 32.2* 32.2* 34.1*  PLT 178 176 184   Recent Labs  Lab 10/08/18 0843 10/09/18 0426 10/10/18 0148  NA 136 137 137  K 3.8 3.4* 3.0*  CL 103 101 101  CO2 '26 27 25  '$ BUN 84* 71* 58*  CREATININE 1.86* 1.61* 1.46*  CALCIUM 9.8 9.7 9.5  GLUCOSE 157* 177* 206*    Imaging/Diagnostic Tests: No new imaging  Daisy Floro, DO 10/10/2018, 12:30 PM PGY-1, [place] Intern pager: [phone], text pages welcome

## 2018-10-10 NOTE — Clinical Social Work Note (Signed)
Clinical Social Work Assessment  Patient Details  Name: [name] MRN: [id number] Date of Birth: 1941/10/11  Date of referral:  10/10/18               Reason for consult:  Discharge Planning                Permission sought to share information with:  Case Manager, Facility Sport and exercise psychologist, Family Supports Permission granted to share information::  Yes, Verbal Permission Granted  Name::     [name]::  SNFs  Relationship::  son  Contact Information:  [phone]  Housing/Transportation Living arrangements for the past 2 months:  [place] of Information:  Patient Patient Interpreter Needed:  None Criminal Activity/Legal Involvement Pertinent to Current Situation/Hospitalization:  No - Comment as needed Significant Relationships:  Adult Children Lives with:  Self Do you feel safe going back to the place where you live?  No Need for family participation in patient care:  Yes (Comment)  Care giving concerns:  CSW received referral for possible SNF placement at time of discharge. Spoke with patient regarding possibility of SNF placement . Patient's son [name]   is currently unable to care for her at their home given patient's current needs and fall risk.  Patient and  Son expressed understanding of PT recommendation and are agreeable to SNF placement at time of discharge. CSW to continue to follow and assist with discharge planning needs.     Social Worker assessment / plan:  Spoke with patient's Son [name] concerning possibility of rehab at [facility] before returning home.    Employment status:  Retired Nurse, adult PT Recommendations:  [place] / Referral to community resources:  [place]  Patient/Family's Response to care:  Patient and son [name] recognize need for rehab before returning home and are agreeable to a SNF in [employer]. They report preference for [facility]  .  CSW explained insurance authorization process. Patient's family reported that they want patient to get stronger to be able to come back home.    Patient/Family's Understanding of and Emotional Response to Diagnosis, Current Treatment, and Prognosis:  Patient/family is realistic regarding therapy needs and expressed being hopeful for SNF  placement. Patient expressed understanding of CSW role and discharge process as well as medical condition. No questions/concerns about plan or treatment.    Emotional Assessment Appearance:  Appears stated age Attitude/Demeanor/Rapport:  Unable to Assess Affect (typically observed):  Unable to Assess Orientation:  Oriented to Self, Oriented to  Time, Oriented to Place, Oriented to Situation Alcohol / Substance use:    Psych involvement (Current and /or in the community):  No (Comment)  Discharge Needs  Concerns to be addressed:  Discharge Planning Concerns Readmission within the last 30 days:  No Current discharge risk:  Dependent with Mobility Barriers to Discharge:  Continued Medical Work up   [employer], LCSW 10/10/2018, 12:12 PM

## 2018-10-10 NOTE — Discharge Instructions (Signed)

## 2018-10-10 NOTE — Progress Notes (Signed)
Pharmacy Antibiotic Note  [name] is a 77 y.o. female admitted on 10/06/2018 with possible lumbar discitis, now s/p aspiration.  Pharmacy has been consulted for daptomycin dosing.   Renal function is returning to baseline.  Afebrile, WBC WNL and CK is WNL.    Plan: Cubicin 640mg  IV Q24H (~8 mg/kg AdjBW) CTX 2gm IV Q24H per MD CK qThurs   Weight: 211 lb 10.3 oz (96 kg)  Temp (24hrs), Avg:99 F (37.2 C), Min:98.8 F (37.1 C), Max:99.2 F (37.3 C)  Recent Labs  Lab 10/06/18 0758 10/07/18 0443 10/08/18 0843 10/09/18 0426 10/10/18 0148  WBC 7.5 7.0 7.7 7.9 8.0  CREATININE 2.41* 2.27* 1.86* 1.61* 1.46*    Estimated Creatinine Clearance: 38.5 mL/min (A) (by C-G formula based on SCr of 1.46 mg/dL (H)).    Allergies  Allergen Reactions  . Benazepril Other (See Comments)    Unknown reaction at age 21-65 - possibly dizziness  . Angiotensin Receptor Blockers Other (See Comments)    Hypotension reaction  . Fe-Succ-C-Thre-B12-Des Stomach Other (See Comments)    Unknown reaction  . Sulfamethoxazole-Trimethoprim Other (See Comments)    Unknown reaction    Vanc 11/25 x1 Cubicin 11/27 >> CTX 11/27 >>  11/28 CK = 31  11/25 BCx - 1/2 viridans group strep (BCID Strep spp, ?contaminant) 11/27 BCx -  11/27 vertebra -   [name] D. Mina Marble, PharmD, BCPS, [place] 10/10/2018, 10:05 AM

## 2018-10-10 NOTE — Consult Note (Signed)
   [facility] CM Inpatient Consult   10/10/2018  [name] 08-05-1941 [id number]  Patient was assessed for [place] [facility] for community services. Patient was previously active with [place] [facility].  Patient's chart review reveals patient is for SNF placement for short term rehab and for possible IV antibiotics   Of note, [facility] Care Management services does not replace or interfere with any services that are arranged by inpatient case management or social work. Will follow for [facility] Care Management needs. Will follow for disposition and needs.  Patient has [employer].  Patient's primary care provider is [place]. For additional questions or referrals please contact:   Natividad Brood, RN BSN [facility] Liaison  [phone] business mobile phone Toll free office [phone]

## 2018-10-10 NOTE — Progress Notes (Signed)
10/10/18 1704  PT Visit Information  Last PT Received On 10/10/18  Assistance Needed +2  PT/OT/SLP Co-Evaluation/Treatment Yes  Reason for Co-Treatment To address functional/ADL transfers;For patient/therapist safety  PT goals addressed during session Mobility/safety with mobility;Balance;Proper use of DME  History of Present Illness Pt is a 77 y/o female admitted secondary to increased back pain. Found to have L2-3 discitis with possible osteomyelitis. Pt is s/p IR disc aspiration. PMH includes CHF, a fib, HTN, R TKA, CKD, OSA on CPAP, and CVA.   Subjective Data  Patient Stated Goal to get stronger before going home   Precautions  Precautions Back  Precaution Booklet Issued No  Precaution Comments Reviewed back precautions with pt.   Restrictions  Weight Bearing Restrictions No  Pain Assessment  Pain Assessment 0-10  Pain Score 8  Pain Location back  Pain Descriptors / Indicators Aching;Grimacing;Guarding  Pain Intervention(s) Limited activity within patient's tolerance;Monitored during session;Repositioned  Cognition  Arousal/Alertness Awake/alert  Behavior During Therapy WFL for tasks assessed/performed  Overall Cognitive Status Within Functional Limits for tasks assessed  Bed Mobility  Overal bed mobility Needs Assistance  Bed Mobility Rolling;Sidelying to Sit  Rolling Min assist  Sidelying to sit Mod assist  General bed mobility comments vc for sequencing and heavy use of bed rails, mod A for trunk elevation  Transfers  Overall transfer level Needs assistance  Equipment used Rolling walker (2 wheeled)  Transfers Sit to/from [employer] Transfers  Sit to Stand Mod assist;+2 physical assistance;+2 safety/equipment;From elevated surface  Stand pivot transfers Mod assist;+2 physical assistance;+2 safety/equipment  General transfer comment assist for boost and balance to transfer to chair using RW. Cues for safe hand placement.   Balance  Overall balance assessment  Needs assistance  Sitting-balance support Bilateral upper extremity supported;Feet supported  Sitting balance-Leahy Scale Fair  Sitting balance - Comments able to sit min guard EOB  Standing balance support Bilateral upper extremity supported  Standing balance-Leahy Scale Poor  Standing balance comment dependent on BUE and external support  PT - End of Session  Equipment Utilized During Treatment Gait belt  Activity Tolerance Patient tolerated treatment well  Patient left in chair;with call bell/phone within reach;with chair alarm set  Nurse Communication Mobility status   PT - Assessment/Plan  PT Plan Current plan remains appropriate  PT Visit Diagnosis Other abnormalities of gait and mobility (R26.89);Difficulty in walking, not elsewhere classified (R26.2);Muscle weakness (generalized) (M62.81);Pain  Pain - part of body  (back )  PT Frequency (ACUTE ONLY) Min 2X/week  Follow Up Recommendations SNF;Supervision/Assistance - 24 hour  PT equipment None recommended by PT  AM-PAC PT "6 Clicks" Mobility Outcome Measure (Version 2)  Help needed turning from your back to your side while in a flat bed without using bedrails? 2  Help needed moving from lying on your back to sitting on the side of a flat bed without using bedrails? 2  Help needed moving to and from a bed to a chair (including a wheelchair)? 2  Help needed standing up from a chair using your arms (e.g., wheelchair or bedside chair)? 2  Help needed to walk in hospital room? 2  Help needed climbing 3-5 steps with a railing?  1  6 Click Score 11  Consider Recommendation of Discharge To: CIR/SNF/LTACH  PT Goal Progression  Progress towards PT goals Progressing toward goals  Acute Rehab PT Goals  PT Goal Formulation With patient  Time For Goal Achievement 10/23/18  Potential to Achieve Goals Fair  PT Time  Calculation  PT Start Time (ACUTE ONLY) 1535  PT Stop Time (ACUTE ONLY) 1605  PT Time Calculation (min) (ACUTE ONLY) 30 min   PT General Charges  $$ ACUTE PT VISIT 1 Visit  PT Treatments  $Therapeutic Activity 8-22 mins   Pt progressing towards goals and able to transfer to chair this session using RW and mod A +2. Reviewed back precautions with pt. Current recommendations appropriate. Will continue to follow acutely to maximize functional mobility independence and safety.  Leighton Ruff, PT, DPT  Acute Rehabilitation Services  Pager: [phone] Office: [phone]

## 2018-10-10 NOTE — Discharge Summary (Addendum)
[facility] Discharge Summary  Patient name: ETNA FORQUER Medical record number: [id number] Date of birth: 1941-10-21 Age: 77 y.o. Gender: female Date of Admission: 10/06/2018  Date of Discharge: 12/3 Admitting Physician: Martyn Malay, MD  Primary Care Provider: Kathrene Alu, MD Consultants: ID, IR, neurosurgery curbside  Indication for Hospitalization: Back pain  Discharge Diagnoses/Problem List:  L2/L3 Discitis/osteomyelitis  Type 2 diabetes, well-controlled HFrEF CKD 3 Hypertension Hyperlipidemia Obstructive sleep apnea on CPAP A. fib on Eliquis History of CVA 2017 CAD S/P CABG 2009 Morbid obesity  Disposition: Discharge to [facility]  Discharge Condition: Stable  Discharge Exam:  General: Alert, NAD HEENT: NCAT, MMM Cardiac: RRR no m/g/r Lungs: Clear bilaterally, no increased WOB  Abdomen: soft, non-tender, non-distended, normoactive BS Msk: Moves all extremities spontaneously, no tenderness palpation of the back insertion site, no surrounding erythema or swelling in the area. Ext: Warm, dry, 2+ distal pulses, +1 pitting edema with chronic venous stasis changes  Brief Hospital Course:  Saylee Sherrill is a 77 year old female that presented for back pain and was ultimately found to have a L2/3 discitis with osteomyelitis via MRI, with an unclear etiology as to development. Neurosurgery consulted on admission, who stated no surgical intervention was required. IR performed a lumbar aspiration biopsy on 11/27 that showed a fibrinoneutrophilic exudate. Blood cultures initially grew 1/2 bottles with Strep viridans, however repeat blood cultures have been NGTD. Echo 11/28 without vegetations. She received several antibiotics, but ultimately started IV Daptomycin and Ceftriaxone on 11/27, but was transitioned to IV penicillin per ID on 12/2 when her wound cultures returned with actinomyces naeslundii.  She will continue this therapy for a total of 6  weeks. PICC lined was placed on 12/2 to continue IV treatment at SNF. At discharge, she was hemodynamically stable, afebrile, and with minimal pain in her back.  Her remaining chronic conditions were well managed during her stay with home medications.   Issues for Follow Up:   1. Will continue IV penicillin until November 18, 2018 per ID recommendation.  She is to have a CBC with differential and BMP weekly during therapy. CRP and ESR to be obtained every 2 weeks during therapy. 2. OSA: Uses CPAP at home, however stated incorrect settings. Please follow up getting appropriate settings, as patient is not using currently due to this.  3. Ensure patient received an upright walker to use during acute recovery period per PT recommendations. Ordered was placed during hospitalization.   4. Please monitor blood pressure and restart her spironolactone as appropriate. During her stay, her spironolactone and losartan were held due to AKI (resolved) and normotensive. We restarted her losartan at d/c, but have continued to hold spironolactone to slowly monitor if her pressures can tolerate this.    Significant Procedures:  10/08/2018 - Aspiration Biopsy  Significant Labs and Imaging:  Recent Labs  Lab 10/12/18 0721 10/13/18 0314 10/14/18 0314  WBC 9.1 7.7 7.7  HGB 10.6* 10.3* 10.2*  HCT 32.8* 32.3* 31.4*  PLT 186 183 181   Recent Labs  Lab 10/10/18 0148 10/11/18 0527 10/12/18 0721 10/13/18 0314 10/14/18 0314  NA 137 139 137 136 136  K 3.0* 3.7 3.2* 3.4* 3.7  CL 101 98 98 98 98  CO2 '25 29 27 27 28  '$ GLUCOSE 206* 153* 135* 149* 169*  BUN 58* 48* 45* 44* 36*  CREATININE 1.46* 1.51* 1.44* 1.42* 1.44*  CALCIUM 9.5 9.7 9.5 9.4 9.3     Results/Tests Pending at Time of  Discharge: None  Discharge Medications:  Allergies as of 10/14/2018      Reactions   Benazepril Other (See Comments)   Unknown reaction at age 52-65 - possibly dizziness   Angiotensin Receptor Blockers Other (See Comments)    Hypotension reaction   Fe-succ-c-thre-b12-des Stomach Other (See Comments)   Unknown reaction   Sulfamethoxazole-trimethoprim Other (See Comments)   Unknown reaction      Medication List    STOP taking these medications   clotrimazole 1 % cream Commonly known as:  LOTRIMIN   docusate sodium 100 MG capsule Commonly known as:  COLACE   oxyCODONE 5 MG immediate release tablet Commonly known as:  Oxy IR/ROXICODONE   spironolactone 25 MG tablet Commonly known as:  ALDACTONE   traMADol 50 MG tablet Commonly known as:  ULTRAM     TAKE these medications   acetaminophen 650 MG CR tablet Commonly known as:  TYLENOL Take 650 mg by mouth every 8 (eight) hours as needed for pain.   apixaban 5 MG Tabs tablet Commonly known as:  ELIQUIS TAKE ONE (1) TABLET BY MOUTH TWO (2) TIMES DAILY   atorvastatin 40 MG tablet Commonly known as:  LIPITOR TAKE 1 TABLET BY MOUTH  DAILY AT 6 PM. What changed:  See the new instructions.   carvedilol 6.25 MG tablet Commonly known as:  COREG Take 1 tablet (6.25 mg total) by mouth 2 (two) times daily with a meal.   cholecalciferol 1000 units tablet Commonly known as:  VITAMIN D Take 1,000 Units by mouth 2 (two) times daily.   ferrous sulfate 325 (65 FE) MG tablet Take 1 tablet (325 mg total) by mouth daily with breakfast.   glucose blood test strip Use three times a day   Insulin Glargine 100 UNIT/ML Solostar Pen Commonly known as:  LANTUS INJECT 17 UNITS INTO SKIN AT BEDTIME   losartan 25 MG tablet Commonly known as:  COZAAR Take 0.5 tablets (12.5 mg total) by mouth daily.   nitroGLYCERIN 0.4 MG SL tablet Commonly known as:  NITROSTAT Place 0.4 mg under the tongue every 5 (five) minutes as needed for chest pain (up to 3 doses).   Pen Needles 32G X 4 MM Misc 1 each by Does not apply route at bedtime. Use to inject Lantus each night.   penicillin G  IVPB Inject 4 Million Units into the vein every 6 (six) hours. Indication:  L-spine  discitis with actinomyces Last Day of Therapy:  11/18/18 Labs - Once weekly:  CBC/D and BMP, Labs - Every other week:  ESR and CRP   rOPINIRole 0.5 MG tablet Commonly known as:  REQUIP TAKE 2 TABLETS BY MOUTH AT  BEDTIME   torsemide 20 MG tablet Commonly known as:  DEMADEX Take 3 tablets (60 mg total) by mouth every morning AND 2 tablets (40 mg total) every evening. Please cancel all previous orders for current medication. Change in dosage or pill size..   vitamin B-12 1000 MCG tablet Commonly known as:  CYANOCOBALAMIN Take 1 tablet (1,000 mcg total) by mouth daily.            Home Infusion Instuctions  (From admission, onward)         Start     Ordered   10/14/18 0000  Home infusion instructions [facility] May follow [place] Dosing Protocol; May administer Cathflo as needed to maintain patency of vascular access device.; Flushing of vascular access device: per [facility] Protocol: 0.9% NaCl pre/post medica.Marland KitchenMarland Kitchen  Question Answer Comment  Instructions May follow [place] Dosing Protocol   Instructions May administer Cathflo as needed to maintain patency of vascular access device.   Instructions Flushing of vascular access device: per [facility] Protocol: 0.9% NaCl pre/post medication administration and prn patency; Heparin 100 u/ml, 58m for implanted ports and Heparin 10u/ml, 570mfor all other central venous catheters.   Instructions May follow [facility] Anaphylaxis Protocol for First Dose Administration in the home: 0.9% NaCl at 25-50 ml/hr to maintain IV access for protocol meds. Epinephrine 0.3 ml IV/IM PRN and Benadryl 25-50 IV/IM PRN s/s of anaphylaxis.   Instructions [facility] Infusion Coordinator (RN) to assist per patient IV care needs in the home PRN.      10/14/18 [place](From admission, onward)         Start     Ordered   10/14/18 1235  For home use only DME Walker tall  (Walkers)  Once    Question:  Patient needs a  walker to treat with the following condition  Answer:  [facility] low [facility]  10/14/18 1235          Discharge Instructions: Please refer to Patient Instructions section of EMR for full details.  Patient was counseled important signs and symptoms that should prompt return to medical care, changes in medications, dietary instructions, activity restrictions, and follow up appointments.   Follow-Up Appointments:  Contact information for follow-up providers    [facility].   Specialty:  Infectious Diseases Why:  Appointment @ 10:00 am with StJanene MadeiraNP. Please arrive 15 minutes prior to your appointment if possible. Kindly call to reschedule if you are unable to make this appointment.  Contact information: [id number] [phone]         Contact information for after-discharge care    Destination    [facility] Preferred SNF .   Service:  Skilled Nursing Contact information: [id number]ers[place] 10/14/2018, 12:45 PM PGY-1, [place]

## 2018-10-10 NOTE — Progress Notes (Deleted)
Physical Therapy Treatment Patient Details Name: [name] MRN: [id number] DOB: 03/03/1941 Today's Date: 10/10/2018    History of Present Illness Pt is a 77 y/o female admitted secondary to increased back pain. Found to have L2-3 discitis with possible osteomyelitis. Pt is s/p IR disc aspiration. PMH includes CHF, a fib, HTN, R TKA, CKD, OSA on CPAP, and CVA.     PT Comments    Pt progressing towards goals and able to transfer to chair this session using RW and mod A +2. Reviewed back precautions with pt. Current recommendations appropriate. Will continue to follow acutely to maximize functional mobility independence and safety.    Follow Up Recommendations  SNF;Supervision/Assistance - 24 hour     Equipment Recommendations  None recommended by PT    Recommendations for Other Services       Precautions / Restrictions Precautions Precautions: Back Precaution Booklet Issued: No Precaution Comments: Reviewed back precautions with pt.  Restrictions Weight Bearing Restrictions: No    Mobility  Bed Mobility Overal bed mobility: Needs Assistance Bed Mobility: Rolling;Sidelying to Sit Rolling: Min assist Sidelying to sit: Mod assist       General bed mobility comments: vc for sequencing and heavy use of bed rails, mod A for trunk elevation  Transfers Overall transfer level: Needs assistance Equipment used: Rolling walker (2 wheeled) Transfers: Sit to/from [employer] Sit to Stand: Mod assist;+2 physical assistance;+2 safety/equipment;From elevated surface Stand pivot transfers: Mod assist;+2 physical assistance;+2 safety/equipment       General transfer comment: assist for boost and balance to transfer to chair using RW. Cues for safe hand placement.   Ambulation/Gait                 Stairs             Wheelchair Mobility    Modified Rankin (Stroke Patients Only)       Balance Overall balance assessment: Needs  assistance Sitting-balance support: Bilateral upper extremity supported;Feet supported Sitting balance-Leahy Scale: Fair Sitting balance - Comments: able to sit min guard EOB   Standing balance support: Bilateral upper extremity supported Standing balance-Leahy Scale: Poor Standing balance comment: dependent on BUE and external support                            Cognition Arousal/Alertness: Awake/alert Behavior During Therapy: WFL for tasks assessed/performed Overall Cognitive Status: Within Functional Limits for tasks assessed                                        Exercises      General Comments        Pertinent Vitals/Pain Pain Assessment: 0-10 Pain Score: 8  Pain Location: back Pain Descriptors / Indicators: Aching;Grimacing;Guarding Pain Intervention(s): Limited activity within patient's tolerance;Monitored during session;Repositioned    Home Living Family/patient expects to be discharged to:: Private residence Living Arrangements: Children(son) Available Help at Discharge: Family;Available 24 hours/day Type of Home: House Home Access: Ramped entrance   Home Layout: One level Home Equipment: Shower seat;Cane - single point;Walker - 2 wheels      Prior Function Level of Independence: Needs assistance  Gait / Transfers Assistance Needed: Uses RW for mobility ADL's / Homemaking Assistance Needed: Aide comes 2 days a week to help with bathing tasks and dressing tasks.      PT Goals (current goals  can now be found in the care plan section) Acute Rehab PT Goals Patient Stated Goal: to get stronger before going home  PT Goal Formulation: With patient Time For Goal Achievement: 10/23/18 Potential to Achieve Goals: Fair Progress towards PT goals: Progressing toward goals    Frequency    Min 2X/week      PT Plan Current plan remains appropriate    Co-evaluation PT/OT/SLP Co-Evaluation/Treatment: Yes Reason for Co-Treatment: To  address functional/ADL transfers;For patient/therapist safety PT goals addressed during session: Mobility/safety with mobility;Balance;Proper use of DME OT goals addressed during session: ADL's and self-care;Proper use of Adaptive equipment and DME      AM-PAC PT "6 Clicks" Mobility   Outcome Measure  Help needed turning from your back to your side while in a flat bed without using bedrails?: A Lot Help needed moving from lying on your back to sitting on the side of a flat bed without using bedrails?: A Lot Help needed moving to and from a bed to a chair (including a wheelchair)?: A Lot Help needed standing up from a chair using your arms (e.g., wheelchair or bedside chair)?: A Lot Help needed to walk in hospital room?: A Lot Help needed climbing 3-5 steps with a railing? : Total 6 Click Score: 11    End of Session Equipment Utilized During Treatment: Gait belt Activity Tolerance: Patient tolerated treatment well Patient left: in chair;with call bell/phone within reach;with chair alarm set Nurse Communication: Mobility status PT Visit Diagnosis: Other abnormalities of gait and mobility (R26.89);Difficulty in walking, not elsewhere classified (R26.2);Muscle weakness (generalized) (M62.81);Pain Pain - part of body: (back )     Time: 6004-5997 PT Time Calculation (min) (ACUTE ONLY): 30 min  Charges:  $Therapeutic Activity: 8-22 mins                     Leighton Ruff, PT, DPT  Acute Rehabilitation Services  Pager: [phone] Office: [phone]    Rudean Hitt 10/10/2018, 5:07 PM

## 2018-10-10 NOTE — Care Management Important Message (Signed)
Important Message  Patient Details  Name: [name] MRN: [id number] Date of Birth: 1940/11/16   Medicare Important Message Given:  Yes    Meredyth Hornung 10/10/2018, 11:40 AM

## 2018-10-10 NOTE — NC FL2 (Signed)
[place] LEVEL OF CARE SCREENING TOOL     IDENTIFICATION  Patient Name: [name] Birthdate: April 15, 1941 Sex: female Admission Date (Current Location): 10/06/2018  [facility] and [place] Number:  [employer] and Address:  [facility], [place] [address] [place], [place] [address]      Provider Number: [id number]  Attending Physician Name and Address:  Martyn Malay, MD  Relative Name and Phone Number:  [name] [phone]    Current Level of Care: Hospital Recommended Level of Care: [place] Prior Approval Number:    Date Approved/Denied:   PASRR Number: [id number]  Discharge Plan: SNF    Current Diagnoses: Patient Active Problem List   Diagnosis Date Noted  . Discitis   . Vertebral osteomyelitis ([place]) 10/06/2018  . Foot deformity 08/05/2018  . Intertrigo 08/05/2018  . Iron deficiency anemia 05/19/2018  . Cloudy urine 04/03/2018  . Dysuria 11/26/2017  . OSA on CPAP   . Hypokalemia 11/20/2017  . Longstanding persistent atrial fibrillation   . Ischemic cardiomyopathy   . Hypertension   . Hemiparesis ([place])   . Facial weakness, post-stroke   . Chronic systolic CHF (congestive heart failure) ([place])   . Chronic lower back pain   . Chronic kidney disease (CKD), stage III (moderate) (HCC)   . Arthritis   . Venous stasis 09/13/2017  . Open wound of right knee, leg, and ankle with complication 97/12/6376  . Morbid obesity with BMI of 40.0-44.9, adult ([place]) 08/14/2017  . Bilateral leg edema 07/18/2017  . Blood loss anemia 04/09/2017  . Abdominal mass, RLQ (right lower quadrant)   . AKI (acute kidney injury) ([place]) 03/26/2017  . History of CVA with residual deficit 03/26/2017  . Pulmonary hypertension ([place]) 03/26/2017  . Morbid obesity ([place]) 03/26/2017  . Gastrointestinal hemorrhage 03/26/2017  . Rectal bleeding   . Chronic pain syndrome   . Coronary artery disease involving coronary bypass graft of  native heart without angina pectoris   . Benign essential HTN   . DM type 2 with diabetic peripheral neuropathy ([place])   . Thrombocytopenia ([place])   . Cerebrovascular accident (CVA) due to embolism of right middle cerebral artery ([place])   . CKD (chronic kidney disease) stage 3, GFR 30-59 ml/min (HCC) 03/30/2016  . Chronic anticoagulation 03/30/2016  . Hyperlipidemia 01/31/2009  . ACC/AHA stage C congestive heart failure due to ischemic cardiomyopathy ([place]) 01/31/2009  . Chronic systolic heart failure ([place]) 01/31/2009  . Atrial fibrillation ([place]) 01/31/2009  . CAD (coronary artery disease) 05/12/2008    Orientation RESPIRATION BLADDER Height & Weight     Self, Time, Situation, Place  Normal Continent, External catheter Weight: 211 lb 10.3 oz (96 kg) Height:     BEHAVIORAL SYMPTOMS/MOOD NEUROLOGICAL BOWEL NUTRITION STATUS      Continent Diet(see discharge summary)  AMBULATORY STATUS COMMUNICATION OF NEEDS Skin   Extensive Assist Verbally Surgical wounds(MASD buttocks, right lower back closed surgical incision)                       Personal Care Assistance Level of Assistance  Bathing, Feeding, Dressing, Total care Bathing Assistance: Maximum assistance Feeding assistance: Independent Dressing Assistance: Maximum assistance Total Care Assistance: Maximum assistance   Functional Limitations Info  Sight, Hearing, Speech Sight Info: Adequate Hearing Info: Adequate Speech Info: Adequate    SPECIAL CARE FACTORS FREQUENCY  PT (By licensed PT), OT (By licensed OT)     PT Frequency: min 5x  weekly OT Frequency: min 3x weekly            Contractures Contractures Info: Not present    Additional Factors Info  Code Status, Allergies Code Status Info: DNR Allergies Info: Allergies:  Benazepril, Angiotensin Receptor Blockers, Fe-succ-c-thre-b12-des Stomach, Sulfamethoxazole-trimethoprim           Current Medications (10/10/2018):  This is the current hospital active  medication list Current Facility-Administered Medications  Medication Dose Route Frequency Provider Last Rate Last Dose  . acetaminophen (TYLENOL) tablet 650 mg  650 mg Oral Q6H PRN Rory Percy, DO   650 mg at 10/08/18 2212   Or  . acetaminophen (TYLENOL) suppository 650 mg  650 mg Rectal Q6H PRN Rory Percy, DO      . apixaban (ELIQUIS) tablet 5 mg  5 mg Oral BID Sela Hilding, MD      . atorvastatin (LIPITOR) tablet 40 mg  40 mg Oral q1800 Rory Percy, DO   40 mg at 10/09/18 1630  . carvedilol (COREG) tablet 6.25 mg  6.25 mg Oral BID WC Kathrene Alu, MD   6.25 mg at 10/09/18 1630  . cefTRIAXone (ROCEPHIN) 2 g in sodium chloride 0.9 % 100 mL IVPB  2 g Intravenous Q24H Susa Raring, RPH 200 mL/hr at 10/09/18 1738 2 g at 10/09/18 1738  . cholecalciferol (VITAMIN D3) tablet 1,000 Units  1,000 Units Oral BID Rory Percy, DO   1,000 Units at 10/09/18 2101  . DAPTOmycin (CUBICIN) 640 mg in sodium chloride 0.9 % IVPB  640 mg Intravenous Q2000 Susa Raring, RPH 225.6 mL/hr at 10/09/18 2101 640 mg at 10/09/18 2101  . docusate sodium (COLACE) capsule 200 mg  200 mg Oral QHS Rory Percy, DO   200 mg at 10/08/18 2213  . hydrocortisone (ANUSOL-HC) 2.5 % rectal cream   Rectal TID Daisy Floro, DO      . insulin aspart (novoLOG) injection 0-9 Units  0-9 Units Subcutaneous TID WC Rory Percy, DO   3 Units at 10/09/18 1632  . insulin glargine (LANTUS) injection 8 Units  8 Units Subcutaneous QHS Rory Percy, DO   8 Units at 10/10/18 0046  . oxyCODONE (Oxy IR/ROXICODONE) immediate release tablet 2.5 mg  2.5 mg Oral Q6H PRN Sela Hilding, MD       Or  . oxyCODONE (Oxy IR/ROXICODONE) immediate release tablet 5 mg  5 mg Oral Q6H PRN Sela Hilding, MD   5 mg at 10/07/18 2142  . polyethylene glycol (MIRALAX / GLYCOLAX) packet 17 g  17 g Oral Daily Rory Percy, DO   17 g at 10/06/18 1322  . potassium chloride (KLOR-CON) packet 40 mEq  40 mEq Oral BID  Milus Banister C, DO      . promethazine (PHENERGAN) tablet 12.5 mg  12.5 mg Oral Q6H PRN Rory Percy, DO      . rOPINIRole (REQUIP) tablet 1 mg  1 mg Oral QHS Rory Percy, DO   1 mg at 10/09/18 2102  . torsemide (DEMADEX) tablet 60 mg  60 mg Oral Daily Sela Hilding, MD   60 mg at 10/09/18 1227  . vitamin B-12 (CYANOCOBALAMIN) tablet 1,000 mcg  1,000 mcg Oral Daily Rory Percy, DO   1,000 mcg at 10/09/18 0940     Discharge Medications: Please see discharge summary for a list of discharge medications.  Relevant Imaging Results:  Relevant Lab Results:   Additional Information SSN: [id number]  Alberteen Sam, LCSW

## 2018-10-11 DIAGNOSIS — M4620 Osteomyelitis of vertebra, site unspecified: Secondary | ICD-10-CM

## 2018-10-11 LAB — BASIC METABOLIC PANEL
Anion gap: 12 (ref 5–15)
BUN: 48 mg/dL — ABNORMAL HIGH (ref 8–23)
CO2: 29 mmol/L (ref 22–32)
CREATININE: 1.51 mg/dL — AB (ref 0.44–1.00)
Calcium: 9.7 mg/dL (ref 8.9–10.3)
Chloride: 98 mmol/L (ref 98–111)
GFR calc Af Amer: 38 mL/min — ABNORMAL LOW (ref >60)
GFR, EST NON AFRICAN AMERICAN: 33 mL/min — AB (ref >60)
GLUCOSE: 153 mg/dL — AB (ref 70–99)
Potassium: 3.7 mmol/L (ref 3.5–5.1)
SODIUM: 139 mmol/L (ref 135–145)

## 2018-10-11 LAB — CBC
HCT: 34.2 % — ABNORMAL LOW (ref 36.0–46.0)
Hemoglobin: 10.8 g/dL — ABNORMAL LOW (ref 12.0–15.0)
MCH: 30.2 pg (ref 26.0–34.0)
MCHC: 31.6 g/dL (ref 30.0–36.0)
MCV: 95.5 fL (ref 80.0–100.0)
Platelets: 211 10*3/uL (ref 150–400)
RBC: 3.58 MIL/uL — ABNORMAL LOW (ref 3.87–5.11)
RDW: 12.9 % (ref 11.5–15.5)
WBC: 9.3 10*3/uL (ref 4.0–10.5)
nRBC: 0 % (ref 0.0–0.2)

## 2018-10-11 LAB — CULTURE, BLOOD (ROUTINE X 2): CULTURE: NO GROWTH

## 2018-10-11 LAB — GLUCOSE, CAPILLARY
Glucose-Capillary: 155 mg/dL — ABNORMAL HIGH (ref 70–99)
Glucose-Capillary: 207 mg/dL — ABNORMAL HIGH (ref 70–99)
Glucose-Capillary: 150 mg/dL — ABNORMAL HIGH (ref 70–99)
Glucose-Capillary: 155 mg/dL — ABNORMAL HIGH (ref 70–99)

## 2018-10-11 NOTE — Progress Notes (Signed)
Diagnosis: Discitis Culture Result: none   Allergies  Allergen Reactions  . Benazepril Other (See Comments)    Unknown reaction at age 79-65 - possibly dizziness  . Angiotensin Receptor Blockers Other (See Comments)    Hypotension reaction  . Fe-Succ-C-Thre-B12-Des Stomach Other (See Comments)    Unknown reaction  . Sulfamethoxazole-Trimethoprim Other (See Comments)    Unknown reaction    OPAT Orders Discharge antibiotics: [place] 2 g daily and daptomycin Per pharmacy protocol  Duration: 6 weeks End Date: January 13, 20 20   [facility] Care Per Protocol:  Labs weekly while on IV antibiotics: _x_ CBC with differential _x_ BMP  x__ CRP _x_ ESR  _x_ CK  x__ Please pull PIC at completion of IV antibiotics __ Please leave PIC in place until doctor has seen patient or been notified  Fax weekly labs to [phone]  Clinic Follow Up Appt:  Early January

## 2018-10-11 NOTE — Progress Notes (Signed)
PHARMACY CONSULT NOTE FOR:  OUTPATIENT  PARENTERAL ANTIBIOTIC THERAPY (OPAT)  Indication: Osteomyelitis Regimen: Daptomycin 640mg  IV q24 and ceftriaxone 2g IV q24 End date: 11/24/18  IV antibiotic discharge orders are pended. To discharging provider:  please sign these orders via discharge navigator,  Select New Orders & click on the button choice - Manage This Unsigned Work.    Onnie Boer, PharmD, BCIDP, AAHIVP, CPP Infectious Disease Pharmacist 10/11/2018 4:36 PM

## 2018-10-11 NOTE — Progress Notes (Signed)
Family Medicine Teaching Service Daily Progress Note Intern Pager: [phone]  Patient name: RONALD VINSANT Medical record number: [id number] Date of birth: 10-Jan-1941 Age: 77 y.o. Gender: female  Primary Care Provider: Kathrene Alu, MD Consultants: ID, IR, Neurosurg curbsided  Code Status: Limited (DNI, confirmed on admission)  Assessment and Plan: KARLEY PHO a 77 y.o.femalepresenting with back pain x3 weeks. PMH is significant forT2DM, HFrEF (35-40%), CKD3, HTN, HLD, OSA on CPAP, afib on Eliquis, h/o CVA 2017, CAD s/p CABG 2009, morbid obesity.  BackPain 2/2 L2-L3 Discitis Osteomyelitis: Improving.  POD2 biopsy. Afebrile, no leukocytosis. Wound culture no anaerobes seen x2 days but culture re-incubated due to small pinpoint colony of unknown type growing (discussed with Micro team), BC negative x3 days.  -ID on board; currently recommending continuing CTX and daptomycin IV for 6 weeks and following cultures --Follow up with need for PICC vs SNF available to administer IV abx.   -Oxycodone 2.5 versus 5 mg every 6 as needed back pain -Follow CBC, BMP, wound and blood cultures -f/u DME on discharge for new motor/chair as well as upright walker during acute recovery period   Type 2 Diabetes, well controlled:Stable.  A1c 6.1. CBG in 150-170s. - Lantus8uqHS - sSSI - CBG AC, qhs  Afib:Chronic, stable.  Rate controlled.   - Cont home Eliquis and coreg   VPX:TGGYIRS, stable.  SBP 105-115.  - Continue home coreg, torsemide  - hold home losartanand spiro, reconsider re-starting on d/c given normotensive   AKI onCKDIII:Resolved.  Cr. 1.51 today, from 2.41 on admit. Baseline 1.5-1.8.  - Continue home torsemide - hold losartan, although AKI resolved, patient is normotensive  - BMP - Compression stockings  Normocytic anemia: Chronic, stable.  Hgb 10.8 this am, baseline 10-11. Likely anemia on chronic diease, CKD.  -Hold home iron in setting of acute  infection -CBC  HFrEF, CAD s/p CABG: Chronic, stable.  Ischemic cardiomyopathy w/prominent RV failure. TEE 11/28 EF 45-50%.  - continue home torsemide, coreg -hold losartan given normotensive (previously for AKI)  - Daily weights - Re-evaluate Is&Os- Torsemide 60 daily; additional 40 mg at night with output less than 1 L.  WNI:OEVOJJ.  - continue home med.  Restless leg syndrome: Stable.  -Continue ropinirole   H/o CVA 2017:Stable.  -cont statin as above   OSA on CPAP:Stable.  Intermittently using her CPAP at night.  - CPAP qHSrecommended  Pain with stooling: Improving.  Likely via pressure near back pain site vs hemorrhoids.  -Anusol TID PRN Rectal pain  Hypokalemia: Resolved K 3.7.  -BMP   FEN/GI:Heart healthy/Carb modified Prophylaxis:SCDs  Disposition: appreciate ID input, potentially d/c in the next few days   Subjective:  Doing well this morning, still has back pain.  Is really hoping to get an upright walker to help during her healing process.  Denies any nausea, vomiting, abdominal pain.  Objective: Temp:  [99 F (37.2 C)-99.9 F (37.7 C)] 99 F (37.2 C) (11/30 0359) Pulse Rate:  [63-81] 75 (11/30 0735) Resp:  [16-22] 18 (11/30 0359) BP: (104-115)/(53-72) 112/64 (11/30 0735) SpO2:  [98 %-100 %] 100 % (11/30 0359) Physical Exam: General: Alert, NAD, older female sitting up comfortably  HEENT: NCAT, MMM  Cardiac: appear to be sinus rhythm this am, 2/6 systolic murmur  Lungs: Clear bilaterally, no increased WOB  Abdomen: soft, non-tender, non-distended, normoactive BS Msk: Moves all extremities spontaneously  Ext: Warm, dry, 2+ distal pulses Derm: back insertion site with bandage covering, no surrounding erythema or pain around site  with palpation.    Laboratory: Recent Labs  Lab 10/09/18 0426 10/10/18 0148 10/11/18 0527  WBC 7.9 8.0 9.3  HGB 10.5* 11.1* 10.8*  HCT 32.2* 34.1* 34.2*  PLT 176 184 211   Recent Labs  Lab  10/09/18 0426 10/10/18 0148 10/11/18 0527  NA 137 137 139  K 3.4* 3.0* 3.7  CL 101 101 98  CO2 27 25 29   BUN 71* 58* 48*  CREATININE 1.61* 1.46* 1.51*  CALCIUM 9.7 9.5 9.7  GLUCOSE 177* 206* 153*     Imaging/Diagnostic Tests: No results found.  Patriciaann Clan, DO 10/11/2018, 7:36 AM PGY-1, [place] Intern pager: [phone], text pages welcome

## 2018-10-11 NOTE — Progress Notes (Signed)
Patient refused CPAP HS tonight. Patient states she's in too much pain for to wear the Cpap tonight.

## 2018-10-11 NOTE — Progress Notes (Signed)
Subjective: No new complaints   Antibiotics:  Anti-infectives (From admission, onward)   Start     Dose/Rate Route Frequency Ordered Stop   10/08/18 2000  DAPTOmycin (CUBICIN) 640 mg in sodium chloride 0.9 % IVPB     640 mg 225.6 mL/hr over 30 Minutes Intravenous Daily 10/08/18 1353     10/08/18 1800  cefTRIAXone (ROCEPHIN) 2 g in sodium chloride 0.9 % 100 mL IVPB     2 g 200 mL/hr over 30 Minutes Intravenous Every 24 hours 10/08/18 1353     10/06/18 0730  vancomycin (VANCOCIN) IVPB 1000 mg/200 mL premix     1,000 mg 200 mL/hr over 60 Minutes Intravenous  Once 10/06/18 0723 10/06/18 0941      Medications: Scheduled Meds: . apixaban  5 mg Oral BID  . atorvastatin  40 mg Oral q1800  . carvedilol  6.25 mg Oral BID WC  . cholecalciferol  1,000 Units Oral BID  . docusate sodium  200 mg Oral QHS  . hydrocortisone   Rectal TID  . insulin aspart  0-9 Units Subcutaneous TID WC  . insulin glargine  8 Units Subcutaneous QHS  . polyethylene glycol  17 g Oral Daily  . rOPINIRole  1 mg Oral QHS  . torsemide  60 mg Oral Daily  . vitamin B-12  1,000 mcg Oral Daily   Continuous Infusions: . cefTRIAXone (ROCEPHIN)  IV 2 g (10/10/18 1638)  . DAPTOmycin (CUBICIN)  IV 640 mg (10/10/18 2309)   PRN Meds:.acetaminophen **OR** acetaminophen, oxyCODONE **OR** oxyCODONE, promethazine    Objective: Weight change:   Intake/Output Summary (Last 24 hours) at 10/11/2018 1616 Last data filed at 10/11/2018 1426 Gross per 24 hour  Intake 700 ml  Output 1800 ml  Net -1100 ml   Blood pressure (!) 110/43, pulse 68, temperature 99.2 F (37.3 C), temperature source Oral, resp. rate 16, weight 96 kg, SpO2 99 %. Temp:  [99 F (37.2 C)-99.9 F (37.7 C)] 99.2 F (37.3 C) (11/30 1508) Pulse Rate:  [66-81] 68 (11/30 1508) Resp:  [16-18] 16 (11/30 1508) BP: (104-115)/(43-66) 110/43 (11/30 1508) SpO2:  [99 %-100 %] 99 % (11/30 1508)  Physical Exam: General: Alert and awake, oriented x3,  not in any acute distress. HEENT: anicteric sclera, EOMI CVS regular rate, normal  Chest: , no wheezing, no respiratory distress Abdomen: soft non-distended,  Extremities: no edema or deformity noted bilaterally Skin: no rashes Neuro: nonfocal  CBC:    BMET Recent Labs    10/10/18 0148 10/11/18 0527  NA 137 139  K 3.0* 3.7  CL 101 98  CO2 25 29  GLUCOSE 206* 153*  BUN 58* 48*  CREATININE 1.46* 1.51*  CALCIUM 9.5 9.7     Liver Panel  No results for input(s): PROT, ALBUMIN, AST, ALT, ALKPHOS, BILITOT, BILIDIR, IBILI in the last 72 hours.     Sedimentation Rate No results for input(s): ESRSEDRATE in the last 72 hours. C-Reactive Protein No results for input(s): CRP in the last 72 hours.  Micro Results: Recent Results (from the past 720 hour(s))  Culture, blood (routine x 2)     Status: Abnormal   Collection Time: 10/06/18  7:59 AM  Result Value Ref Range Status   Specimen Description BLOOD RIGHT ARM  Final   Special Requests   Final    BOTTLES DRAWN AEROBIC AND ANAEROBIC Blood Culture adequate volume   Culture  Setup Time   Final    GRAM POSITIVE COCCI  ANAEROBIC BOTTLE ONLY Organism ID to follow CRITICAL RESULT CALLED TO, READ BACK BY AND VERIFIED WITH: G ABBOTT PHARMD 0144 10/07/18 A BROWNING    Culture (A)  Final    VIRIDANS STREPTOCOCCUS THE SIGNIFICANCE OF ISOLATING THIS ORGANISM FROM A SINGLE SET OF BLOOD CULTURES WHEN MULTIPLE SETS ARE DRAWN IS UNCERTAIN. PLEASE NOTIFY THE MICROBIOLOGY DEPARTMENT WITHIN ONE WEEK IF SPECIATION AND SENSITIVITIES ARE REQUIRED. Performed at [facility], [place] [address] [place], [place] [address]    Report Status 10/09/2018 FINAL  Final  Culture, blood (routine x 2)     Status: None   Collection Time: 10/06/18  7:59 AM  Result Value Ref Range Status   Specimen Description BLOOD RIGHT HAND  Final   Special Requests   Final    BOTTLES DRAWN AEROBIC AND ANAEROBIC Blood Culture results may not be optimal due to an  inadequate volume of blood received in culture bottles   Culture   Final    NO GROWTH 5 DAYS Performed at [facility], [place] [address], [place], [place] [address]    Report Status 10/11/2018 FINAL  Final  Blood Culture ID Panel (Reflexed)     Status: Abnormal   Collection Time: 10/06/18  7:59 AM  Result Value Ref Range Status   Enterococcus species NOT DETECTED NOT DETECTED Final   Listeria monocytogenes NOT DETECTED NOT DETECTED Final   Staphylococcus species NOT DETECTED NOT DETECTED Final   Staphylococcus aureus (BCID) NOT DETECTED NOT DETECTED Final   Streptococcus species DETECTED (A) NOT DETECTED Final    Comment: Not Enterococcus species, Streptococcus agalactiae, Streptococcus pyogenes, or Streptococcus pneumoniae. CRITICAL RESULT CALLED TO, READ BACK BY AND VERIFIED WITH: G ABBOTT PHARMD 0144 10/07/18 A BROWNING    Streptococcus agalactiae NOT DETECTED NOT DETECTED Final   Streptococcus pneumoniae NOT DETECTED NOT DETECTED Final   Streptococcus pyogenes NOT DETECTED NOT DETECTED Final   Acinetobacter baumannii NOT DETECTED NOT DETECTED Final   Enterobacteriaceae species NOT DETECTED NOT DETECTED Final   Enterobacter cloacae complex NOT DETECTED NOT DETECTED Final   Escherichia coli NOT DETECTED NOT DETECTED Final   Klebsiella oxytoca NOT DETECTED NOT DETECTED Final   Klebsiella pneumoniae NOT DETECTED NOT DETECTED Final   Proteus species NOT DETECTED NOT DETECTED Final   Serratia marcescens NOT DETECTED NOT DETECTED Final   Haemophilus influenzae NOT DETECTED NOT DETECTED Final   Neisseria meningitidis NOT DETECTED NOT DETECTED Final   Pseudomonas aeruginosa NOT DETECTED NOT DETECTED Final   Candida albicans NOT DETECTED NOT DETECTED Final   Candida glabrata NOT DETECTED NOT DETECTED Final   Candida krusei NOT DETECTED NOT DETECTED Final   Candida parapsilosis NOT DETECTED NOT DETECTED Final   Candida tropicalis NOT DETECTED NOT DETECTED Final    Comment: Performed  at [facility], [place]. [address], [place], [place] [address]  Culture, blood (Routine X 2) w Reflex to ID Panel     Status: None (Preliminary result)   Collection Time: 10/08/18  8:43 AM  Result Value Ref Range Status   Specimen Description BLOOD BLOOD LEFT FOREARM  Final   Special Requests   Final    BOTTLES DRAWN AEROBIC ONLY Blood Culture adequate volume   Culture   Final    NO GROWTH 3 DAYS Performed at [facility], [place] [address], [place],  [address]    Report Status PENDING  Incomplete  Culture, blood (Routine X 2) w Reflex to ID Panel  Status: None (Preliminary result)   Collection Time: 10/08/18  8:43 AM  Result Value Ref Range Status   Specimen Description BLOOD BLOOD RIGHT FOREARM  Final   Special Requests   Final    BOTTLES DRAWN AEROBIC AND ANAEROBIC Blood Culture adequate volume   Culture   Final    NO GROWTH 3 DAYS Performed at [facility], [address], [place], [place] [address]    Report Status PENDING  Incomplete  Aerobic/Anaerobic Culture (surgical/deep wound)     Status: None (Preliminary result)   Collection Time: 10/08/18 11:22 AM  Result Value Ref Range Status   Specimen Description VERTEBRA  Final   Special Requests Normal  Final   Gram Stain   Final    FEW WBC PRESENT,BOTH PMN AND MONONUCLEAR NO ORGANISMS SEEN Performed at [facility], [address], [place], [place] [address]    Culture   Final    CULTURE REINCUBATED FOR BETTER GROWTH NO ANAEROBES ISOLATED; CULTURE IN PROGRESS FOR 5 DAYS    Report Status PENDING  Incomplete    Studies/Results: No results found.    Assessment/Plan:  INTERVAL HISTORY: Cultures  remained without growth.   Principal Problem:   Vertebral osteomyelitis ([place]) Active Problems:   Discitis    [name] is a 77 y.o. female with discitis status post IR guided aspirate with cultures that are unrevealing so far.  And lack of growth in 3 days we are unlikely to be seeing  an aerobic organism.  I am skeptical we will isolate an anaerobic organism either.  I will follow-up the cultures but the plan if the cultures remain negative will be to give her 6 weeks of IV daptomycin and ceftriaxone.     LOS: 5 days   Alcide Evener 10/11/2018, 4:16 PM

## 2018-10-11 NOTE — Plan of Care (Signed)
  Problem: Education: Goal: Knowledge of General Education information will improve Description: Including pain rating scale, medication(s)/side effects and non-pharmacologic comfort measures Outcome: Progressing   Problem: Clinical Measurements: Goal: Ability to maintain clinical measurements within normal limits will improve Outcome: Progressing   Problem: Activity: Goal: Risk for activity intolerance will decrease Outcome: Progressing   Problem: Safety: Goal: Ability to remain free from injury will improve Outcome: Progressing   Problem: Skin Integrity: Goal: Risk for impaired skin integrity will decrease Outcome: Progressing   

## 2018-10-11 NOTE — Progress Notes (Signed)
Patient stated that when the CPAP is placed on she has trouble breathing. (Nose plugging up) Stated that she wears one at home, but this does not happen at home. Called Respiratory and spoke with Linton Rump, and he stated that if it is giving her trouble, she is able to take it off. Patient is currently up and breathing fine. Will continue to monitor for any troubles with breathing.

## 2018-10-12 ENCOUNTER — Inpatient Hospital Stay: Payer: Self-pay

## 2018-10-12 DIAGNOSIS — I509 Heart failure, unspecified: Secondary | ICD-10-CM

## 2018-10-12 DIAGNOSIS — M4646 Discitis, unspecified, lumbar region: Secondary | ICD-10-CM

## 2018-10-12 HISTORY — DX: Discitis, unspecified, lumbar region: M46.46

## 2018-10-12 LAB — BASIC METABOLIC PANEL
Anion gap: 12 (ref 5–15)
BUN: 45 mg/dL — ABNORMAL HIGH (ref 8–23)
CO2: 27 mmol/L (ref 22–32)
Calcium: 9.5 mg/dL (ref 8.9–10.3)
Chloride: 98 mmol/L (ref 98–111)
Creatinine, Ser: 1.44 mg/dL — ABNORMAL HIGH (ref 0.44–1.00)
GFR calc Af Amer: 40 mL/min — ABNORMAL LOW (ref >60)
GFR calc non Af Amer: 34 mL/min — ABNORMAL LOW (ref >60)
Glucose, Bld: 135 mg/dL — ABNORMAL HIGH (ref 70–99)
Potassium: 3.2 mmol/L — ABNORMAL LOW (ref 3.5–5.1)
Sodium: 137 mmol/L (ref 135–145)

## 2018-10-12 LAB — URINALYSIS, ROUTINE W REFLEX MICROSCOPIC
Bilirubin Urine: NEGATIVE
Glucose, UA: NEGATIVE mg/dL
Hgb urine dipstick: NEGATIVE
Ketones, ur: NEGATIVE mg/dL
Leukocytes, UA: NEGATIVE
Nitrite: NEGATIVE
Protein, ur: NEGATIVE mg/dL
Specific Gravity, Urine: 1.01 (ref 1.005–1.030)
pH: 5 (ref 5.0–8.0)

## 2018-10-12 LAB — CBC
HCT: 32.8 % — ABNORMAL LOW (ref 36.0–46.0)
Hemoglobin: 10.6 g/dL — ABNORMAL LOW (ref 12.0–15.0)
MCH: 30.7 pg (ref 26.0–34.0)
MCHC: 32.3 g/dL (ref 30.0–36.0)
MCV: 95.1 fL (ref 80.0–100.0)
NRBC: 0 % (ref 0.0–0.2)
Platelets: 186 10*3/uL (ref 150–400)
RBC: 3.45 MIL/uL — ABNORMAL LOW (ref 3.87–5.11)
RDW: 12.8 % (ref 11.5–15.5)
WBC: 9.1 10*3/uL (ref 4.0–10.5)

## 2018-10-12 LAB — GLUCOSE, CAPILLARY
Glucose-Capillary: 121 mg/dL — ABNORMAL HIGH (ref 70–99)
Glucose-Capillary: 162 mg/dL — ABNORMAL HIGH (ref 70–99)
Glucose-Capillary: 158 mg/dL — ABNORMAL HIGH (ref 70–99)

## 2018-10-12 MED ORDER — POTASSIUM CHLORIDE 20 MEQ PO PACK
20.0000 meq | PACK | Freq: Two times a day (BID) | ORAL | Status: AC
  Administered 2018-10-12 (×2): 20 meq via ORAL
  Filled 2018-10-12 (×2): qty 1

## 2018-10-12 MED ORDER — GERHARDT'S BUTT CREAM
TOPICAL_CREAM | Freq: Four times a day (QID) | CUTANEOUS | Status: DC
  Administered 2018-10-12 – 2018-10-14 (×7): via TOPICAL
  Filled 2018-10-12: qty 1

## 2018-10-12 MED ORDER — POTASSIUM CHLORIDE CRYS ER 20 MEQ PO TBCR
40.0000 meq | EXTENDED_RELEASE_TABLET | Freq: Once | ORAL | Status: DC
  Filled 2018-10-12: qty 2

## 2018-10-12 NOTE — Care Management (Signed)
Discussed DC plan with patient.  Patient is hoping to go to [facility] from hospital.  PICC is ordered in anticipation of 6 weeks of IV antibiotics.  Anticipate PICC placement tomorrow and D/C when SNF available.

## 2018-10-12 NOTE — Plan of Care (Signed)
  Problem: Education: °Goal: Knowledge of General Education information will improve °Description: Including pain rating scale, medication(s)/side effects and non-pharmacologic comfort measures °Outcome: Progressing °  °Problem: Elimination: °Goal: Will not experience complications related to bowel motility °Outcome: Progressing °  °Problem: Safety: °Goal: Ability to remain free from injury will improve °Outcome: Progressing °  °Problem: Skin Integrity: °Goal: Risk for impaired skin integrity will decrease °Outcome: Progressing °  °

## 2018-10-12 NOTE — Progress Notes (Signed)
Pt has refused cpap at this time and states she will not wear again while here. RT removed device and DC'd order. RT will monitor.

## 2018-10-12 NOTE — Consult Note (Signed)
[place] Nurse wound consult note Reason for Consult: Stage 3 pressure injury to coccyx area Wound type:Pressure plus shear, moisture (incontinence (dual) Pressure Injury POA: Yes Measurement: 7cm x 11cm x 0.1cm Wound bed:30% yellow slough, 70% red, moist Drainage (amount, consistency, odor) scant serous Periwound: macerated and soiled from UI and FI Dressing procedure/placement/frequency: I will implement a POC to include a topical preparation: Gerhart's Butt Cream (hydriocortisone/zinc oxide/antifungal) and provide Nursing with guidance for turning and repositioning and for degree of elevation for bed to reduce shear. Patient has a pressure redistribution chair pad for OOB use.  I have provided pressure redistribution heel boots for use downstream. Recommend RN consult for caloric need increase due to pressure injury. If you agree, please order. [facility] nursing team will not follow, but will remain available to this patient, the nursing and medical teams.  Please re-consult if needed. Thanks, Maudie Flakes, MSN, RN, [place], Arther Abbott  Pager# [phone]

## 2018-10-12 NOTE — Plan of Care (Signed)
  Problem: Education: Goal: Knowledge of General Education information will improve Description Including pain rating scale, medication(s)/side effects and non-pharmacologic comfort measures Outcome: Progressing   Problem: Clinical Measurements: Goal: Will remain free from infection Outcome: Progressing   Problem: Nutrition: Goal: Adequate nutrition will be maintained Outcome: Progressing   Problem: Elimination: Goal: Will not experience complications related to bowel motility Outcome: Progressing Goal: Will not experience complications related to urinary retention Outcome: Progressing   Problem: Safety: Goal: Ability to remain free from injury will improve Outcome: Progressing   Problem: Skin Integrity: Goal: Risk for impaired skin integrity will decrease Outcome: Progressing

## 2018-10-12 NOTE — Plan of Care (Signed)
  Problem: Activity: Goal: Risk for activity intolerance will decrease Outcome: Progressing   Problem: Nutrition: Goal: Adequate nutrition will be maintained Outcome: Progressing   Problem: Coping: Goal: Level of anxiety will decrease Outcome: Progressing   Problem: Safety: Goal: Ability to remain free from injury will improve Outcome: Progressing   Problem: Skin Integrity: Goal: Risk for impaired skin integrity will decrease Outcome: Progressing   

## 2018-10-12 NOTE — Progress Notes (Signed)
Family Medicine Teaching Service Daily Progress Note Intern Pager: [phone]  Patient name: [name] Medical record number: [id number] Date of birth: 12-18-1940 Age: 77 y.o. Gender: female  Primary Care Provider: Kathrene Alu, MD Consultants: ID, IR, Neurosurg curbsided  Code Status: Limited (DNI, confirmed on admission)  Assessment and Plan: [name] a 77 y.o.femalepresenting with back pain x3 weeks. PMH is significant forT2DM, HFrEF (35-40%), CKD3, HTN, HLD, OSA on CPAP, afib on Eliquis, h/o CVA 2017, CAD s/p CABG 2009, morbid obesity.  BackPain 2/2 L2-L3 Discitis Osteomyelitis: Improving.  POD #3 biopsy.  Remains afebrile without leukocytosis.  Wound culture continues to reintubate for better growth, blood cultures no growth at 3 days. -ID recommending continued CTX and daptomycin x 6 weeks, unless change in cultures -Will place order for PICC line to be placed -Tylenol and Oxycodone 2.5 versus 5 mg every 6 as needed for back pain, however only used once recently - Follow CBC, BMP, wound and blood cultures - F/U on SNF placement and new motor chair/upright walker  Hypokalemia: Intermittent.  K 3.2 this am, likely secondary to diarrhea as below.  -BMP  -replete with 40 meq   Frequent bowel movements: acute Nursing reports several "mushy" bowel movements of yesterday and today. Denies hematochezia or melena. Likely secondary to antibiotic regimen vs softening on stool following bowel regimen (constipated prior to). Will monitor fever curve, WBC, and development of diffuse watery BM's more consistent with C. Diff.  -Monitor bowel movements  -d/c'd colace, will continue to keep miralax on board to avoid hard stools given back injury   Type 2 Diabetes, well controlled:Stable.  A1c 6.1. CBG average 150-160s. On average receiving 5-6 units SSI daily.  - Lantus8uqHS - sSSI - CBG AC, qhs  Afib:Chronic, stable.  Rate controlled.   - Cont home Eliquis and  coreg   VEH:MCNOBSJ, stable.  SBP 105-120.  - Continue home coreg, torsemide  - hold home losartanand spiro, reconsider re-starting on d/c given normotensive   CKDIII:Stable. At baseline around 1.5-1.8.  - Continue home torsemide - hold losartan, although AKI resolved, patient is normotensive  - BMP - Compression stockings  Normocytic anemia: Chronic, stable.  At baseline of 10-11. Likely anemia on chronic diease, CKD.  -Hold home iron in setting of acute infection -CBC  HFrEF, CAD s/p CABG: Chronic, stable.  Ischemic cardiomyopathy w/prominent RV failure. TEE 11/28 EF 45-50%.  - continue home torsemide, coreg -hold losartan given normotensive (previously for AKI)  - Daily weights - Re-evaluate Is&Os- Torsemide 60 daily; additional 40 mg at night with output < 1 L. - Compression stockings  GGE:ZMOQHU.  - continue home med.  Restless leg syndrome: Stable.  -Continue ropinirole   H/o CVA 2017:Stable.  -cont statin as above   OSA on CPAP:Stable.  Intermittently using her CPAP at night.  - CPAP qHSrecommended  FEN/GI:Heart healthy/Carb modified Prophylaxis: Eliquis   Disposition: Plan to place PICC, then SNF placement   Subjective:  Doing well this morning, frustrated with her continued soft bowel movements over the course of yesterday and today.  States she also has some suprapubic pressure, however has been having no problems with urination.  Denies any dysuria, blood in urine, hematochezia or melena.  Objective: Temp:  [98 F (36.7 C)-99.2 F (37.3 C)] 98 F (36.7 C) (12/01 0525) Pulse Rate:  [67-68] 68 (12/01 0525) Resp:  [16] 16 (12/01 0525) BP: (100-122)/(43-55) 122/55 (12/01 0525) SpO2:  [98 %-100 %] 100 % (12/01 0525) Weight:  [96.8  kg] 96.8 kg (12/01 0655) Physical Exam: General: Alert, NAD HEENT: NCAT, MMM, oropharynx nonerythematous  Cardiac: RRR no m/g/r, appears in sinus  Lungs: Clear bilaterally, no increased WOB  Abdomen:  soft, slightly tender in suprapubic region, non-distended, normoactive BS Msk: Moves all extremities spontaneously  Ext: Warm, dry, 2+ distal pulses, 1+ pitting edema  Derm: Back insertion site with no surrounding erythema or pain around site with palpation   Laboratory: Recent Labs  Lab 10/09/18 0426 10/10/18 0148 10/11/18 0527  WBC 7.9 8.0 9.3  HGB 10.5* 11.1* 10.8*  HCT 32.2* 34.1* 34.2*  PLT 176 184 211   Recent Labs  Lab 10/09/18 0426 10/10/18 0148 10/11/18 0527  NA 137 137 139  K 3.4* 3.0* 3.7  CL 101 101 98  CO2 27 25 29   BUN 71* 58* 48*  CREATININE 1.61* 1.46* 1.51*  CALCIUM 9.7 9.5 9.7  GLUCOSE 177* 206* 153*     Imaging/Diagnostic Tests: No results found.  Patriciaann Clan, DO 10/12/2018, 7:44 AM PGY-1, [facility] Intern pager: [phone], text pages welcome

## 2018-10-12 NOTE — Progress Notes (Signed)
Paged and spoke with MD on call about patients consistent stools & the need for a stool sample. Was informed that a sample does not need to be taken right now, but if the stools become watery, action should be taken. Patients electrolyte labs are within limits and patient is staying well hydrated. Will continue to monitor for any changes.

## 2018-10-13 ENCOUNTER — Other Ambulatory Visit (HOSPITAL_COMMUNITY): Payer: Medicare Other

## 2018-10-13 DIAGNOSIS — Z95828 Presence of other vascular implants and grafts: Secondary | ICD-10-CM

## 2018-10-13 DIAGNOSIS — L89152 Pressure ulcer of sacral region, stage 2: Secondary | ICD-10-CM

## 2018-10-13 DIAGNOSIS — L8990 Pressure ulcer of unspecified site, unspecified stage: Secondary | ICD-10-CM

## 2018-10-13 DIAGNOSIS — Z888 Allergy status to other drugs, medicaments and biological substances status: Secondary | ICD-10-CM

## 2018-10-13 DIAGNOSIS — M4626 Osteomyelitis of vertebra, lumbar region: Secondary | ICD-10-CM

## 2018-10-13 DIAGNOSIS — Z881 Allergy status to other antibiotic agents status: Secondary | ICD-10-CM

## 2018-10-13 DIAGNOSIS — A429 Actinomycosis, unspecified: Secondary | ICD-10-CM

## 2018-10-13 LAB — CBC
HCT: 32.3 % — ABNORMAL LOW (ref 36.0–46.0)
HEMOGLOBIN: 10.3 g/dL — AB (ref 12.0–15.0)
MCH: 30.5 pg (ref 26.0–34.0)
MCHC: 31.9 g/dL (ref 30.0–36.0)
MCV: 95.6 fL (ref 80.0–100.0)
Platelets: 183 10*3/uL (ref 150–400)
RBC: 3.38 MIL/uL — ABNORMAL LOW (ref 3.87–5.11)
RDW: 12.8 % (ref 11.5–15.5)
WBC: 7.7 10*3/uL (ref 4.0–10.5)
nRBC: 0 % (ref 0.0–0.2)

## 2018-10-13 LAB — GLUCOSE, CAPILLARY
GLUCOSE-CAPILLARY: 148 mg/dL — AB (ref 70–99)
Glucose-Capillary: 173 mg/dL — ABNORMAL HIGH (ref 70–99)
Glucose-Capillary: 177 mg/dL — ABNORMAL HIGH (ref 70–99)
Glucose-Capillary: 273 mg/dL — ABNORMAL HIGH (ref 70–99)

## 2018-10-13 LAB — CULTURE, BLOOD (ROUTINE X 2)
CULTURE: NO GROWTH
Culture: NO GROWTH
Special Requests: ADEQUATE
Special Requests: ADEQUATE

## 2018-10-13 LAB — AEROBIC/ANAEROBIC CULTURE (SURGICAL/DEEP WOUND): SPECIAL REQUESTS: NORMAL

## 2018-10-13 LAB — BASIC METABOLIC PANEL
Anion gap: 11 (ref 5–15)
BUN: 44 mg/dL — ABNORMAL HIGH (ref 8–23)
CO2: 27 mmol/L (ref 22–32)
Calcium: 9.4 mg/dL (ref 8.9–10.3)
Chloride: 98 mmol/L (ref 98–111)
Creatinine, Ser: 1.42 mg/dL — ABNORMAL HIGH (ref 0.44–1.00)
GFR calc Af Amer: 41 mL/min — ABNORMAL LOW (ref >60)
GFR, EST NON AFRICAN AMERICAN: 35 mL/min — AB (ref >60)
Glucose, Bld: 149 mg/dL — ABNORMAL HIGH (ref 70–99)
POTASSIUM: 3.4 mmol/L — AB (ref 3.5–5.1)
Sodium: 136 mmol/L (ref 135–145)

## 2018-10-13 LAB — AEROBIC/ANAEROBIC CULTURE W GRAM STAIN (SURGICAL/DEEP WOUND)

## 2018-10-13 LAB — URINE CULTURE: Culture: NO GROWTH

## 2018-10-13 MED ORDER — PENICILLIN G POTASSIUM 20000000 UNITS IJ SOLR
4.0000 10*6.[IU] | Freq: Four times a day (QID) | INTRAVENOUS | Status: DC
  Administered 2018-10-13 – 2018-10-14 (×4): 4 10*6.[IU] via INTRAVENOUS
  Filled 2018-10-13 (×6): qty 4

## 2018-10-13 MED ORDER — POTASSIUM CHLORIDE 20 MEQ PO PACK
40.0000 meq | PACK | Freq: Once | ORAL | Status: AC
  Administered 2018-10-13: 40 meq via ORAL
  Filled 2018-10-13 (×2): qty 2

## 2018-10-13 MED ORDER — SODIUM CHLORIDE 0.9% FLUSH: 10.0000 mL | INTRAVENOUS | Status: DC | PRN

## 2018-10-13 MED ORDER — POTASSIUM CHLORIDE CRYS ER 20 MEQ PO TBCR
40.0000 meq | EXTENDED_RELEASE_TABLET | Freq: Once | ORAL | Status: DC
  Filled 2018-10-13: qty 2

## 2018-10-13 NOTE — Progress Notes (Signed)
Peripherally Inserted Central Catheter/Midline Placement  The IV Nurse has discussed with the patient and/or persons authorized to consent for the patient, the purpose of this procedure and the potential benefits and risks involved with this procedure.  The benefits include less needle sticks, lab draws from the catheter, and the patient may be discharged home with the catheter. Risks include, but not limited to, infection, bleeding, blood clot (thrombus formation), and puncture of an artery; nerve damage and irregular heartbeat and possibility to perform a PICC exchange if needed/ordered by physician.  Alternatives to this procedure were also discussed.  Bard Power PICC patient education guide, fact sheet on infection prevention and patient information card has been provided to patient /or left at bedside.    PICC/Midline Placement Documentation  PICC Single Lumen 59/93/57 PICC Right Basilic 41 cm 0 cm (Active)  Indication for Insertion or Continuance of Line Home intravenous therapies (PICC only) 10/13/2018 11:15 AM  Exposed Catheter (cm) 0 cm 10/13/2018 11:15 AM  Site Assessment Clean;Dry;Intact 10/13/2018 11:15 AM  Line Status Flushed;Saline locked;Blood return noted 10/13/2018 11:15 AM  Dressing Type Transparent;Securing device 10/13/2018 11:15 AM  Dressing Status Clean;Dry;Intact;Antimicrobial disc in place 10/13/2018 11:15 AM  Dressing Change Due 10/20/18 10/13/2018 11:15 AM       Frances Maywood 10/13/2018, 11:42 AM

## 2018-10-13 NOTE — Plan of Care (Signed)
  Problem: Education: Goal: Knowledge of General Education information will improve Description Including pain rating scale, medication(s)/side effects and non-pharmacologic comfort measures Outcome: Progressing   

## 2018-10-13 NOTE — Progress Notes (Signed)
[facility] for Infectious Disease  Date of Admission:  10/06/2018   Total days of antibiotics 5        Day 5 ceftriaxone and daptomycin           Patient ID: [name] is a 77 y.o. 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 apixaban  5 mg Oral BID  . atorvastatin  40 mg Oral q1800  . carvedilol  6.25 mg Oral BID WC  . cholecalciferol  1,000 Units Oral BID  . Gerhardt's butt cream   Topical QID  . hydrocortisone   Rectal TID  . insulin aspart  0-9 Units Subcutaneous TID WC  . insulin glargine  8 Units Subcutaneous QHS  . polyethylene glycol  17 g Oral Daily  . potassium chloride  40 mEq Oral Once  . rOPINIRole  1 mg Oral QHS  . torsemide  60 mg Oral Daily  . vitamin B-12  1,000 mcg Oral Daily    SUBJECTIVE: Getting PICC line now. Back pain is getting better but has not been up much.   Allergies  Allergen Reactions  . Benazepril Other (See Comments)    Unknown reaction at age 61-65 - possibly dizziness  . Angiotensin Receptor Blockers Other (See Comments)    Hypotension reaction  . Fe-Succ-C-Thre-B12-Des Stomach Other (See Comments)    Unknown reaction  . Sulfamethoxazole-Trimethoprim Other (See Comments)    Unknown reaction    OBJECTIVE: Vitals:   10/12/18 0808 10/12/18 1450 10/12/18 2203 10/13/18 0631  BP: (!) 114/53 (!) 119/55 (!) 105/50 125/69  Pulse: 83 64 63 67  Resp:  '16 14 15  '$ Temp: 98.4 F (36.9 C) 98.9 F (37.2 C) 98.5 F (36.9 C) 97.9 F (36.6 C)  TempSrc: Oral Oral Oral Oral  SpO2: 98% 100% 98% 99%  Weight:       Body mass index is 31.53 kg/m.  Physical Exam  Constitutional: She is oriented to person, place, and time. She appears well-developed and well-nourished.  HENT:  Mouth/Throat: Oropharynx is clear and moist.  Eyes: Pupils are equal, round, and reactive  to light. No scleral icterus.  Cardiovascular: Normal rate and regular rhythm.  Pulmonary/Chest: Effort normal.  Abdominal: She exhibits no distension.  Lymphadenopathy:    She has no cervical adenopathy.  Neurological: She is alert and oriented to person, place, and time.  RUE PICC: clean, dry and intact. Just inserted.   Lab Results Lab Results  Component Value Date   WBC 7.7 10/13/2018   HGB 10.3 (L) 10/13/2018   HCT 32.3 (L) 10/13/2018   MCV 95.6 10/13/2018   PLT 183 10/13/2018    Lab Results  Component Value Date   CREATININE 1.42 (H) 10/13/2018   BUN 44 (H) 10/13/2018   NA 136 10/13/2018   K 3.4 (L) 10/13/2018   CL 98 10/13/2018   CO2 27 10/13/2018    Lab Results  Component Value Date   ALT 14 03/25/2017   AST 27 03/25/2017   ALKPHOS 99 03/25/2017   BILITOT 0.8 03/25/2017    Sed Rate (mm/hr)  Date Value  10/06/2018 80 (H)   CRP (mg/dL)  Date Value  10/07/2018 10.2 (H)    Microbiology: 11/25 BCx >> no growth  11/27 vertebral aspirate >> few actinomyces naeslundii  ASSESSMENT: 77 y.o. F with vertebral discitis due to actinomyces naeslundii. Will start penicillin 4 million units q8h IV and stop daptomycin/ceftriaxone. She will need long course (6-12 months) of oral antibiotics following IV treatment over the next 4-6 weeks.    PLAN: 1. OPAT orders amended >> see below.   OPAT ORDERS:  Diagnosis: L-Spine Discitis   Culture Result: Actinomyces naeslundii  Allergies  Allergen Reactions  . Benazepril Other (See Comments)    Unknown reaction at age 23-65 - possibly dizziness  . Angiotensin Receptor Blockers Other (See Comments)    Hypotension reaction  . Fe-Succ-C-Thre-B12-Des Stomach Other (See Comments)    Unknown reaction  . Sulfamethoxazole-Trimethoprim Other (See Comments)    Unknown reaction    Discharge antibiotics: Penicillin 4 million units every 8 hours IV   Duration: 6 weeks   End Date: January 7th 2020  [facility] Care and  Maintenance Per Protocol _x_ Please pull PIC at completion of IV antibiotics __ Please leave PIC in place until doctor has seen patient or been notified  Labs weekly while on IV antibiotics: _x_ CBC with differential _x_ BMP _x_ CRP & ESR every 2 weeks    Fax weekly labs to [phone]  Clinic Follow Up Appt: With Colletta [place], NP January 7th 2020 @ 10:00 am @ [place], MSN, NP-C [facility] for Infectious [place] Pager: [phone]  10/13/2018  10:49 AM

## 2018-10-13 NOTE — Progress Notes (Signed)
PHARMACY CONSULT NOTE FOR:  OUTPATIENT  PARENTERAL ANTIBIOTIC THERAPY (OPAT)  Indication: L-spine discitis w/ actinomyces naselundii Regimen: Penicillin IV 4 million units Q 6 hours End date: 11/18/18  IV antibiotic discharge orders are pended. To discharging provider:  please sign these orders via discharge navigator,  Select New Orders & click on the button choice - Manage This Unsigned Work.     Thank you for allowing pharmacy to be a part of this patient's care.  Jimmy Footman, PharmD, BCPS, [facility] Infectious Diseases Clinical Pharmacist Phone: [phone] 10/13/2018, 11:25 AM

## 2018-10-13 NOTE — Progress Notes (Signed)
Family Medicine Teaching Service Daily Progress Note Intern Pager: [phone]  Patient name: [name] Medical record number: [id number] Date of birth: 08-30-1941 Age: 77 y.o. Gender: female  Primary Care Provider: Kathrene Alu, MD Consultants: ID, IR, Neurosurg curbsided  Code Status: Limited (DNI, confirmed on admission)  Assessment and Plan: [name] a 77 y.o.femalepresenting with back pain x3 weeks. PMH is significant forT2DM, HFrEF (35-40%), CKD3, HTN, HLD, OSA on CPAP, afib on Eliquis, h/o CVA 2017, CAD s/p CABG 2009, morbid obesity.  BackPain 2/2 L2-L3 Discitis Osteomyelitis: Improving.  POD #4 biopsy. Remains afebrile without leukocytosis. Wound culture growing actinomyces naesludii, blood cultures no growth at 4 days. -ID on board, switched CTX + daptomycin to IV penicillin for the remaining duration of 6 weeks (-11/18/2017) -Order placed for PICC line to be placed, hopefully today -Tylenol PRN for pain -F/u CBC, BMP, and cultures  -F/u SW for SNF placement and walkers   Hypokalemia: Intermittent.  K 3.4 this am. Likely 2/2 to recent frequent BMs as below.  -BMP  -replete with 40 meq   Frequent bowel movements: Resolved.  No further bowel movements since yesterday, likely increase was from excessive bowel regimen and antibiotics. Will continue to monitor.  -Monitor bowel movements  -Cont miralax   Type 2 Diabetes, well controlled:Stable.  A1c 6.1. CBG average 150. On average receiving 5-6 units SSI daily.  - Lantus8uqHS - sSSI - CBG AC, qhs  Afib:Chronic, stable.  Rate controlled.   - Cont home Eliquis and coreg   OXB:DZHGDJM, stable.  SBP 105-120.  - Continue home coreg, torsemide  - hold home losartanand spiro, reconsider re-starting on d/c given normotensive   CKDIII:Stable. At baseline around 1.5-1.8.  - Continue home torsemide - hold losartan, although AKI resolved, patient is normotensive  - BMP - Compression  stockings  Normocytic anemia: Chronic, stable.  At baseline of 10-11. Likely anemia on chronic diease, CKD.  -Hold home iron in setting of acute infection -CBC  HFrEF, CAD s/p CABG: Chronic, stable.  Ischemic cardiomyopathy w/prominent RV failure. TEE 11/28 EF 45-50%.  - continue home torsemide, coreg -hold losartan given normotensive (previously for AKI)  - Daily weights - Re-evaluate Is&Os- Torsemide 60 daily; additional 40 mg at night with output < 1 L. - Compression stockings  EQA:STMHDQ.  - continue home med.  Restless leg syndrome: Stable.  -Continue ropinirole   H/o CVA 2017:Stable.  -cont statin as above   OSA on CPAP:Stable.  Intermittently using her CPAP at night.  - CPAP qHSrecommended  FEN/GI:Heart healthy/Carb modified Prophylaxis: Eliquis   Disposition: Plan to place PICC, then SNF placement   Subjective:  Doing well this morning, no complaints. Back pain is well managed with tylenol. No further excessive BM's. Eating well.   Objective: Temp:  [97.9 F (36.6 C)-98.9 F (37.2 C)] 97.9 F (36.6 C) (12/02 0631) Pulse Rate:  [63-83] 67 (12/02 0631) Resp:  [14-16] 15 (12/02 0631) BP: (105-125)/(50-69) 125/69 (12/02 0631) SpO2:  [98 %-100 %] 99 % (12/02 0631) Weight:  [96.8 kg] 96.8 kg (12/01 0655) Physical Exam: General: Alert, NAD HEENT: NCAT, MMM Cardiac: RRR no m/g/r, appears in sinus  Lungs: Clear bilaterally, no increased WOB  Abdomen: soft, non-tender, non-distended, normoactive BS Msk: Moves all extremities spontaneously  Ext: Warm, dry, 2+ distal pulses, 1+ pitting edema  Derm: back insertion site without erythema or pain around site with palpation   Laboratory: Recent Labs  Lab 10/11/18 0527 10/12/18 0721 10/13/18 0314  WBC 9.3 9.1  7.7  HGB 10.8* 10.6* 10.3*  HCT 34.2* 32.8* 32.3*  PLT 211 186 183   Recent Labs  Lab 10/11/18 0527 10/12/18 0721 10/13/18 0314  NA 139 137 136  K 3.7 3.2* 3.4*  CL 98 98 98  CO2  29 27 27   BUN 48* 45* 44*  CREATININE 1.51* 1.44* 1.42*  CALCIUM 9.7 9.5 9.4  GLUCOSE 153* 135* 149*     Imaging/Diagnostic Tests: [place] Ekg Site Rite  Result Date: 10/12/2018 If Site Rite image not attached, placement could not be confirmed due to current cardiac rhythm.   Patriciaann Clan, DO 10/13/2018, 6:53 AM PGY-1, [place] Intern pager: [phone], text pages welcome

## 2018-10-13 NOTE — Progress Notes (Signed)
Pharmacy Antibiotic Note  [name] is a 77 y.o. female admitted on 10/06/2018 with discitis.  Pharmacy has been consulted for Daptomycin dosing.  ID: L2-L3 discitis and possible osteo - afebrile, WBC WNL  Vanc 11/25 x1 Cubicin 11/27 >> CTX 11/27 >>  11/28 CK = 31  11/25 BCx - 1/2 viridans group strep (BCID Strep sp, ?contaminant) 11/27 BCx - NGTD 11/27 vertebra -FEW ACTINOMYCES NAESLUNDII  Plan: Cubicin 640mg  IV Q24H (~8 mg/kg AdjBW) CTX 2gm IV Q24H per MD CK qThursdays  F/u for any change in abx based on culture     Weight: 213 lb 8 oz (96.8 kg)  Temp (24hrs), Avg:98.4 F (36.9 C), Min:97.9 F (36.6 C), Max:98.9 F (37.2 C)  Recent Labs  Lab 10/09/18 0426 10/10/18 0148 10/11/18 0527 10/12/18 0721 10/13/18 0314  WBC 7.9 8.0 9.3 9.1 7.7  CREATININE 1.61* 1.46* 1.51* 1.44* 1.42*    Estimated Creatinine Clearance: 39.8 mL/min (A) (by C-G formula based on SCr of 1.42 mg/dL (H)).    Allergies  Allergen Reactions  . Benazepril Other (See Comments)    Unknown reaction at age 1-65 - possibly dizziness  . Angiotensin Receptor Blockers Other (See Comments)    Hypotension reaction  . Fe-Succ-C-Thre-B12-Des Stomach Other (See Comments)    Unknown reaction  . Sulfamethoxazole-Trimethoprim Other (See Comments)    Unknown reaction    [name] [name], PharmD, BCPS Clinical Staff Pharmacist [name] 10/13/2018 7:22 AM

## 2018-10-13 NOTE — Progress Notes (Signed)
CSW spoke with [facility] who had started insurance auth last week in preparation to accept patient, as they believed patient would be receiving oral antibiotics at discharge.   Patient will be receiving IV antibiotics at dc, SNF reported they were unable to take patient due to expensive IV antibiotics. CSW noted in chart that patient will be receiving penicillin IV antibiotics, and made a call back to [facility] to see if this  IV antibiotic would be at a lower cost to the SNF and change their mind regarding accepting patient.    CSW awaiting response.   [place], [place]

## 2018-10-14 DIAGNOSIS — I5022 Chronic systolic (congestive) heart failure: Secondary | ICD-10-CM | POA: Diagnosis not present

## 2018-10-14 DIAGNOSIS — I509 Heart failure, unspecified: Secondary | ICD-10-CM | POA: Diagnosis not present

## 2018-10-14 DIAGNOSIS — R509 Fever, unspecified: Secondary | ICD-10-CM | POA: Diagnosis not present

## 2018-10-14 DIAGNOSIS — R609 Edema, unspecified: Secondary | ICD-10-CM | POA: Diagnosis not present

## 2018-10-14 DIAGNOSIS — M869 Osteomyelitis, unspecified: Secondary | ICD-10-CM | POA: Diagnosis not present

## 2018-10-14 DIAGNOSIS — N183 Chronic kidney disease, stage 3 (moderate): Secondary | ICD-10-CM | POA: Diagnosis not present

## 2018-10-14 DIAGNOSIS — M2550 Pain in unspecified joint: Secondary | ICD-10-CM | POA: Diagnosis not present

## 2018-10-14 DIAGNOSIS — M545 Low back pain: Secondary | ICD-10-CM | POA: Diagnosis not present

## 2018-10-14 DIAGNOSIS — M4626 Osteomyelitis of vertebra, lumbar region: Secondary | ICD-10-CM | POA: Diagnosis not present

## 2018-10-14 DIAGNOSIS — R079 Chest pain, unspecified: Secondary | ICD-10-CM | POA: Diagnosis not present

## 2018-10-14 DIAGNOSIS — G8929 Other chronic pain: Secondary | ICD-10-CM | POA: Diagnosis not present

## 2018-10-14 DIAGNOSIS — Z9989 Dependence on other enabling machines and devices: Secondary | ICD-10-CM | POA: Diagnosis not present

## 2018-10-14 DIAGNOSIS — I25810 Atherosclerosis of coronary artery bypass graft(s) without angina pectoris: Secondary | ICD-10-CM | POA: Diagnosis not present

## 2018-10-14 DIAGNOSIS — I10 Essential (primary) hypertension: Secondary | ICD-10-CM | POA: Diagnosis not present

## 2018-10-14 DIAGNOSIS — E785 Hyperlipidemia, unspecified: Secondary | ICD-10-CM | POA: Diagnosis not present

## 2018-10-14 DIAGNOSIS — M4646 Discitis, unspecified, lumbar region: Secondary | ICD-10-CM | POA: Diagnosis not present

## 2018-10-14 DIAGNOSIS — L988 Other specified disorders of the skin and subcutaneous tissue: Secondary | ICD-10-CM | POA: Diagnosis not present

## 2018-10-14 DIAGNOSIS — I959 Hypotension, unspecified: Secondary | ICD-10-CM | POA: Diagnosis not present

## 2018-10-14 DIAGNOSIS — L89152 Pressure ulcer of sacral region, stage 2: Secondary | ICD-10-CM | POA: Diagnosis not present

## 2018-10-14 DIAGNOSIS — Z7401 Bed confinement status: Secondary | ICD-10-CM | POA: Diagnosis not present

## 2018-10-14 DIAGNOSIS — A429 Actinomycosis, unspecified: Secondary | ICD-10-CM | POA: Diagnosis not present

## 2018-10-14 DIAGNOSIS — I639 Cerebral infarction, unspecified: Secondary | ICD-10-CM | POA: Diagnosis not present

## 2018-10-14 DIAGNOSIS — E119 Type 2 diabetes mellitus without complications: Secondary | ICD-10-CM | POA: Diagnosis not present

## 2018-10-14 DIAGNOSIS — M4640 Discitis, unspecified, site unspecified: Secondary | ICD-10-CM | POA: Diagnosis not present

## 2018-10-14 DIAGNOSIS — M4620 Osteomyelitis of vertebra, site unspecified: Secondary | ICD-10-CM | POA: Diagnosis not present

## 2018-10-14 DIAGNOSIS — I4891 Unspecified atrial fibrillation: Secondary | ICD-10-CM | POA: Diagnosis not present

## 2018-10-14 DIAGNOSIS — L89312 Pressure ulcer of right buttock, stage 2: Secondary | ICD-10-CM | POA: Diagnosis not present

## 2018-10-14 DIAGNOSIS — G4733 Obstructive sleep apnea (adult) (pediatric): Secondary | ICD-10-CM | POA: Diagnosis not present

## 2018-10-14 DIAGNOSIS — R531 Weakness: Secondary | ICD-10-CM | POA: Diagnosis not present

## 2018-10-14 DIAGNOSIS — I5020 Unspecified systolic (congestive) heart failure: Secondary | ICD-10-CM | POA: Diagnosis not present

## 2018-10-14 DIAGNOSIS — Z7901 Long term (current) use of anticoagulants: Secondary | ICD-10-CM | POA: Diagnosis not present

## 2018-10-14 LAB — CBC WITH DIFFERENTIAL/PLATELET
Abs Immature Granulocytes: 0.05 10*3/uL (ref 0.00–0.07)
BASOS ABS: 0 10*3/uL (ref 0.0–0.1)
Basophils Relative: 1 %
EOS PCT: 5 %
Eosinophils Absolute: 0.4 10*3/uL (ref 0.0–0.5)
HCT: 31.4 % — ABNORMAL LOW (ref 36.0–46.0)
Hemoglobin: 10.2 g/dL — ABNORMAL LOW (ref 12.0–15.0)
Immature Granulocytes: 1 %
Lymphocytes Relative: 23 %
Lymphs Abs: 1.8 10*3/uL (ref 0.7–4.0)
MCH: 31.1 pg (ref 26.0–34.0)
MCHC: 32.5 g/dL (ref 30.0–36.0)
MCV: 95.7 fL (ref 80.0–100.0)
Monocytes Absolute: 1 10*3/uL (ref 0.1–1.0)
Monocytes Relative: 13 %
Neutro Abs: 4.5 10*3/uL (ref 1.7–7.7)
Neutrophils Relative %: 57 %
Platelets: 181 10*3/uL (ref 150–400)
RBC: 3.28 MIL/uL — AB (ref 3.87–5.11)
RDW: 12.7 % (ref 11.5–15.5)
WBC: 7.7 10*3/uL (ref 4.0–10.5)
nRBC: 0 % (ref 0.0–0.2)

## 2018-10-14 LAB — BASIC METABOLIC PANEL
Anion gap: 10 (ref 5–15)
BUN: 36 mg/dL — ABNORMAL HIGH (ref 8–23)
CO2: 28 mmol/L (ref 22–32)
CREATININE: 1.44 mg/dL — AB (ref 0.44–1.00)
Calcium: 9.3 mg/dL (ref 8.9–10.3)
Chloride: 98 mmol/L (ref 98–111)
GFR calc non Af Amer: 34 mL/min — ABNORMAL LOW (ref >60)
GFR, EST AFRICAN AMERICAN: 40 mL/min — AB (ref >60)
Glucose, Bld: 169 mg/dL — ABNORMAL HIGH (ref 70–99)
Potassium: 3.7 mmol/L (ref 3.5–5.1)
Sodium: 136 mmol/L (ref 135–145)

## 2018-10-14 LAB — GLUCOSE, CAPILLARY
Glucose-Capillary: 138 mg/dL — ABNORMAL HIGH (ref 70–99)
Glucose-Capillary: 211 mg/dL — ABNORMAL HIGH (ref 70–99)

## 2018-10-14 MED ORDER — PENICILLIN G POTASSIUM IV (FOR PTA / DISCHARGE USE ONLY): 4.0000 10*6.[IU] | Freq: Four times a day (QID) | INTRAVENOUS | 0 refills | Status: AC

## 2018-10-14 NOTE — Progress Notes (Signed)
Occupational Therapy Treatment Patient Details Name: [name] MRN: [id number] DOB: Dec 24, 1940 Today's Date: 10/14/2018    History of present illness Pt is a 77 y/o female admitted secondary to increased back pain. Found to have L2-3 discitis with possible osteomyelitis. Pt is s/p IR disc aspiration. PMH includes CHF, a fib, HTN, R TKA, CKD, OSA on CPAP, and CVA.    OT comments  Pt progressing towards OT goals, presents supine in bed pleasant and willing to participate in therapy session. Pt with improvements in functional mobility, requiring minA+2 for short distance mobility in room this session using RW. Pt completing seated grooming ADLs with setup assist. Encouraged OOB to recliner daily with staff assist and pt in agreement. Feel POC remains appropriate at this time. Will continue to follow acutely to progress pt towards established OT goals.   Follow Up Recommendations  SNF;Supervision/Assistance - 24 hour    Equipment Recommendations  Other (comment)(defer to next venue )          Precautions / Restrictions Precautions Precautions: Back Precaution Booklet Issued: No Precaution Comments: Reviewed back precautions with pt. Pt able to verbalize 3/3 precautions Restrictions Weight Bearing Restrictions: No       Mobility Bed Mobility Overal bed mobility: Needs Assistance Bed Mobility: Rolling;Sidelying to Sit Rolling: Min assist Sidelying to sit: Min assist       General bed mobility comments: vc for sequencing use of bed rails, assist for trunk support/elevation. Cues to scoot hips towards EOB with pt able to perform given increased time  Transfers Overall transfer level: Needs assistance Equipment used: Rolling walker (2 wheeled) Transfers: Sit to/from Stand Sit to Stand: +2 physical assistance;+2 safety/equipment;From elevated surface;Min assist         General transfer comment: assist to rise and steady from slightly elevated EOB; VCs hand placement     Balance Overall balance assessment: Needs assistance Sitting-balance support: Bilateral upper extremity supported;Feet supported Sitting balance-Leahy Scale: Fair Sitting balance - Comments: able to sit min guard EOB   Standing balance support: Bilateral upper extremity supported Standing balance-Leahy Scale: Poor Standing balance comment: use of UE support/external support                           ADL either performed or assessed with clinical judgement   ADL Overall ADL's : Needs assistance/impaired     Grooming: Wash/dry face;Set up;Sitting                               Functional mobility during ADLs: Minimal assistance;+2 for physical assistance;+2 for safety/equipment;Rolling walker General ADL Comments: pt with improvements in mobility and activity tolerance this session. Able to perform functional mobility in room from EOB to window using RW with two person assist and chair follow.      Vision       Perception     Praxis      Cognition Arousal/Alertness: Awake/alert Behavior During Therapy: WFL for tasks assessed/performed Overall Cognitive Status: Within Functional Limits for tasks assessed                                          Exercises     Shoulder Instructions       General Comments      Pertinent Vitals/ Pain  Pain Assessment: Faces Faces Pain Scale: Hurts little more Pain Location: back, sacral region Pain Descriptors / Indicators: Aching;Guarding;Sore Pain Intervention(s): Monitored during session;Repositioned  Home Living                                          Prior Functioning/Environment              Frequency  Min 2X/week        Progress Toward Goals  OT Goals(current goals can now be found in the care plan section)  Progress towards OT goals: Progressing toward goals  Acute Rehab OT Goals Patient Stated Goal: hopeful to d/c to rehab today  OT Goal  Formulation: With patient Time For Goal Achievement: 10/24/18 Potential to Achieve Goals: Good ADL Goals Pt Will Perform Grooming: with modified independence;sitting Pt Will Perform Lower Body Bathing: with mod assist;with adaptive equipment Pt Will Perform Lower Body Dressing: with adaptive equipment;sit to/from stand;with max assist Pt Will Transfer to Toilet: with mod assist;stand pivot transfer  Plan Discharge plan remains appropriate    Co-evaluation    PT/OT/SLP Co-Evaluation/Treatment: Yes Reason for Co-Treatment: For patient/therapist safety;To address functional/ADL transfers   OT goals addressed during session: ADL's and self-care;Proper use of Adaptive equipment and DME      AM-PAC OT "6 Clicks" Daily Activity     Outcome Measure   Help from another person eating meals?: A Little Help from another person taking care of personal grooming?: A Little Help from another person toileting, which includes using toliet, bedpan, or urinal?: A Lot Help from another person bathing (including washing, rinsing, drying)?: A Lot Help from another person to put on and taking off regular upper body clothing?: A Little Help from another person to put on and taking off regular lower body clothing?: A Lot 6 Click Score: 15    End of Session Equipment Utilized During Treatment: Gait belt;Rolling walker  OT Visit Diagnosis: Unsteadiness on feet (R26.81);Other abnormalities of gait and mobility (R26.89);Muscle weakness (generalized) (M62.81);Pain Pain - part of body: (back)   Activity Tolerance Patient tolerated treatment well   Patient Left in chair;with call bell/phone within reach;with chair alarm set   Nurse Communication Mobility status        Time: 5681-2751 OT Time Calculation (min): 29 min  Charges: OT General Charges $OT Visit: 1 Visit OT Treatments $Self Care/Home Management : 8-22 mins  Lou Cal, [place] Pager  [phone] Office [phone]    Raymondo Band 10/14/2018, 11:34 AM

## 2018-10-14 NOTE — Plan of Care (Signed)
  Problem: Education: Goal: Knowledge of General Education information will improve Description Including pain rating scale, medication(s)/side effects and non-pharmacologic comfort measures Outcome: Progressing   Problem: Clinical Measurements: Goal: Ability to maintain clinical measurements within normal limits will improve Outcome: Progressing   Problem: Activity: Goal: Risk for activity intolerance will decrease Outcome: Progressing   Problem: Skin Integrity: Goal: Risk for impaired skin integrity will decrease Outcome: Progressing

## 2018-10-14 NOTE — Progress Notes (Signed)
Patient discharging to [facility]. Report called in to [facility] L[facility]. Awaiting transportation.

## 2018-10-14 NOTE — Plan of Care (Signed)

## 2018-10-14 NOTE — Progress Notes (Addendum)
Patient will DC to: [facility] Anticipated DC date: 10/14/18 Family notified: lvm with [facility] [place] by: Corey Harold  Per MD patient ready for DC to [facility]. RN, patient, patient's family, and facility notified of DC. Discharge Summary sent to facility. RN given number for report [phone] Room 204. DC packet on chart. Ambulance transport requested for patient.  CSW signing off.  [place], [place]

## 2018-10-14 NOTE — Progress Notes (Signed)
Physical Therapy Treatment Patient Details Name: [name] MRN: [id number] DOB: 1941-09-05 Today's Date: 10/14/2018    History of Present Illness Pt is a 77 y/o female admitted secondary to increased back pain. Found to have L2-3 discitis with possible osteomyelitis. Pt is s/p IR disc aspiration. PMH includes CHF, a fib, HTN, R TKA, CKD, OSA on CPAP, and CVA.     PT Comments    Pt able to ambulate a short distance in the room today with MIN A of 2 and recliner follow. Con't to recommend SNF.   Follow Up Recommendations  SNF;Supervision/Assistance - 24 hour     Equipment Recommendations  None recommended by PT    Recommendations for Other Services       Precautions / Restrictions Precautions Precautions: Back Precaution Booklet Issued: No Precaution Comments: Reviewed back precautions with pt. Pt able to verbalize 3/3 precautions Restrictions Weight Bearing Restrictions: No    Mobility  Bed Mobility Overal bed mobility: Needs Assistance Bed Mobility: Rolling;Sidelying to Sit Rolling: Min assist Sidelying to sit: Min assist       General bed mobility comments: vc for sequencing use of bed rails, assist for trunk support/elevation. Cues to scoot hips towards EOB with pt able to perform given increased time  Transfers Overall transfer level: Needs assistance Equipment used: Rolling walker (2 wheeled) Transfers: Sit to/from Stand Sit to Stand: Min assist;+2 physical assistance;+2 safety/equipment         General transfer comment: cues for proper hand placement and safe technique for standing and sitting  Ambulation/Gait Ambulation/Gait assistance: Min assist;+2 physical assistance;+2 safety/equipment Gait Distance (Feet): 10 Feet Assistive device: Rolling walker (2 wheeled) Gait Pattern/deviations: Decreased step length - right;Decreased step length - left Gait velocity: decreased   General Gait Details: decreased step length, but able to keep head up.  recliner follow.   Stairs             Wheelchair Mobility    Modified Rankin (Stroke Patients Only)       Balance Overall balance assessment: Needs assistance Sitting-balance support: Bilateral upper extremity supported;Feet supported Sitting balance-Leahy Scale: Fair Sitting balance - Comments: able to sit min guard EOB   Standing balance support: Bilateral upper extremity supported Standing balance-Leahy Scale: Poor Standing balance comment: use of UE support/external support                            Cognition Arousal/Alertness: Awake/alert Behavior During Therapy: WFL for tasks assessed/performed Overall Cognitive Status: Within Functional Limits for tasks assessed                                        Exercises      General Comments        Pertinent Vitals/Pain Pain Assessment: Faces Faces Pain Scale: Hurts little more Pain Location: back, sacral region Pain Descriptors / Indicators: Aching;Guarding;Sore Pain Intervention(s): Monitored during session;Limited activity within patient's tolerance;Repositioned    Home Living                      Prior Function            PT Goals (current goals can now be found in the care plan section) Acute Rehab PT Goals Patient Stated Goal: hopeful to d/c to rehab today  PT Goal Formulation: With patient Time For Goal Achievement:  10/23/18 Potential to Achieve Goals: Fair Progress towards PT goals: Progressing toward goals    Frequency    Min 2X/week      PT Plan Current plan remains appropriate    Co-evaluation PT/OT/SLP Co-Evaluation/Treatment: Yes Reason for Co-Treatment: For patient/therapist safety PT goals addressed during session: Mobility/safety with mobility;Proper use of DME;Balance OT goals addressed during session: ADL's and self-care;Proper use of Adaptive equipment and DME      AM-PAC PT "6 Clicks" Mobility   Outcome Measure  Help needed  turning from your back to your side while in a flat bed without using bedrails?: A Little Help needed moving from lying on your back to sitting on the side of a flat bed without using bedrails?: A Little Help needed moving to and from a bed to a chair (including a wheelchair)?: A Little Help needed standing up from a chair using your arms (e.g., wheelchair or bedside chair)?: A Little Help needed to walk in hospital room?: A Lot Help needed climbing 3-5 steps with a railing? : A Lot 6 Click Score: 16    End of Session Equipment Utilized During Treatment: Gait belt Activity Tolerance: Patient tolerated treatment well Patient left: in chair;with call bell/phone within reach;with chair alarm set   PT Visit Diagnosis: Other abnormalities of gait and mobility (R26.89);Difficulty in walking, not elsewhere classified (R26.2);Muscle weakness (generalized) (M62.81);Pain     Time: 8295-6213 PT Time Calculation (min) (ACUTE ONLY): 29 min  Charges:  $Gait Training: 8-22 mins                     [name] Finerty L. Katrinka Blazing, [place] Pager [phone] 10/14/2018    [name] 10/14/2018, 12:53 PM

## 2018-10-14 NOTE — Clinical Social Work Placement (Signed)
   CLINICAL SOCIAL WORK PLACEMENT  NOTE  Date:  10/14/2018  Patient Details  Name: [name] MRN: [id number] Date of Birth: 1940/12/16  Clinical Social Work is seeking post-discharge placement for this patient at the [place] level of care (*CSW will initial, date and re-position this form in  chart as items are completed):      Patient/family provided with [place] Work Department's list of facilities offering this level of care within the geographic area requested by the patient (or if unable, by the patient's family).  Yes   Patient/family informed of their freedom to choose among providers that offer the needed level of care, that participate in Medicare, Medicaid or managed care program needed by the patient, have an available bed and are willing to accept the patient.      Patient/family informed of [facility]'s ownership interest in [facility] and [facility], as well as of the fact that they are under no obligation to receive care at these facilities.  PASRR submitted to EDS on       PASRR number received on 10/10/18     Existing PASRR number confirmed on       FL2 transmitted to all facilities in geographic area requested by pt/family on 10/10/18     FL2 transmitted to all facilities within larger geographic area on       Patient informed that his/her managed care company has contracts with or will negotiate with certain facilities, including the following:        Yes   Patient/family informed of bed offers received.  Patient chooses bed at [place], [place]     Physician recommends and patient chooses bed at      Patient to be transferred to [facility], [place] on 10/14/18.  Patient to be transferred to facility by [facility]     Patient family notified on 10/14/18 of transfer.  Name of family member notified:  Almyra Free (daughter)      PHYSICIAN       Additional Comment:     _______________________________________________ Alberteen Sam, LCSW 10/14/2018, 1:06 PM

## 2018-10-17 ENCOUNTER — Other Ambulatory Visit: Payer: Self-pay

## 2018-10-17 NOTE — Patient Outreach (Signed)
[facility]) Care Management  10/17/2018  [name] [birth year] [phone]   Medication Adherence call to Mrs. [name] left a message for patient to call back patient is showing past due on Losartan 25 mg. [name] is showing past due under [place].   [place] Management Direct Dial [phone]  Fax [phone] [email or web address]

## 2018-10-19 DIAGNOSIS — I5020 Unspecified systolic (congestive) heart failure: Secondary | ICD-10-CM | POA: Diagnosis not present

## 2018-10-19 DIAGNOSIS — E119 Type 2 diabetes mellitus without complications: Secondary | ICD-10-CM | POA: Diagnosis not present

## 2018-10-19 DIAGNOSIS — M869 Osteomyelitis, unspecified: Secondary | ICD-10-CM | POA: Diagnosis not present

## 2018-10-19 DIAGNOSIS — M4640 Discitis, unspecified, site unspecified: Secondary | ICD-10-CM | POA: Diagnosis not present

## 2018-10-19 DIAGNOSIS — G4733 Obstructive sleep apnea (adult) (pediatric): Secondary | ICD-10-CM | POA: Diagnosis not present

## 2018-10-20 ENCOUNTER — Encounter (HOSPITAL_COMMUNITY): Payer: Medicare Other

## 2018-10-21 DIAGNOSIS — L89312 Pressure ulcer of right buttock, stage 2: Secondary | ICD-10-CM | POA: Diagnosis not present

## 2018-10-21 DIAGNOSIS — L988 Other specified disorders of the skin and subcutaneous tissue: Secondary | ICD-10-CM | POA: Diagnosis not present

## 2018-10-21 DIAGNOSIS — L89152 Pressure ulcer of sacral region, stage 2: Secondary | ICD-10-CM | POA: Diagnosis not present

## 2018-10-24 ENCOUNTER — Ambulatory Visit: Payer: Medicare Other | Admitting: Family Medicine

## 2018-10-28 DIAGNOSIS — L89152 Pressure ulcer of sacral region, stage 2: Secondary | ICD-10-CM | POA: Diagnosis not present

## 2018-10-28 DIAGNOSIS — L89312 Pressure ulcer of right buttock, stage 2: Secondary | ICD-10-CM | POA: Diagnosis not present

## 2018-10-28 DIAGNOSIS — L988 Other specified disorders of the skin and subcutaneous tissue: Secondary | ICD-10-CM | POA: Diagnosis not present

## 2018-11-04 DIAGNOSIS — L89312 Pressure ulcer of right buttock, stage 2: Secondary | ICD-10-CM | POA: Diagnosis not present

## 2018-11-04 DIAGNOSIS — L89152 Pressure ulcer of sacral region, stage 2: Secondary | ICD-10-CM | POA: Diagnosis not present

## 2018-11-12 DIAGNOSIS — E119 Type 2 diabetes mellitus without complications: Secondary | ICD-10-CM | POA: Diagnosis not present

## 2018-11-12 DIAGNOSIS — I4891 Unspecified atrial fibrillation: Secondary | ICD-10-CM | POA: Diagnosis not present

## 2018-11-12 DIAGNOSIS — M4640 Discitis, unspecified, site unspecified: Secondary | ICD-10-CM | POA: Diagnosis not present

## 2018-11-12 DIAGNOSIS — I509 Heart failure, unspecified: Secondary | ICD-10-CM | POA: Diagnosis not present

## 2018-11-12 DIAGNOSIS — M869 Osteomyelitis, unspecified: Secondary | ICD-10-CM | POA: Diagnosis not present

## 2018-11-18 ENCOUNTER — Ambulatory Visit: Payer: Medicare Other | Admitting: Podiatry

## 2018-11-18 ENCOUNTER — Inpatient Hospital Stay: Payer: Medicare Other | Admitting: Infectious Diseases

## 2018-11-19 ENCOUNTER — Encounter: Payer: Self-pay | Admitting: Infectious Diseases

## 2018-11-19 ENCOUNTER — Ambulatory Visit: Payer: Medicare Other | Admitting: Infectious Diseases

## 2018-11-19 VITALS — BP 142/79 | HR 69 | Temp 97.7°F | Wt 232.0 lb

## 2018-11-19 DIAGNOSIS — M4620 Osteomyelitis of vertebra, site unspecified: Secondary | ICD-10-CM

## 2018-11-19 DIAGNOSIS — A429 Actinomycosis, unspecified: Secondary | ICD-10-CM

## 2018-11-19 MED ORDER — AMOXICILLIN 500 MG PO CAPS: 500.0000 mg | ORAL_CAPSULE | Freq: Four times a day (QID) | ORAL | 1 refills | Status: DC

## 2018-11-19 NOTE — Assessment & Plan Note (Signed)
She has completed 6 weeks therapy with IV PCN - transition to oral therapy to continue treatment today. Check CRP, ESR, CBC today. ESR trended down on therapy 80 >> 50 (12/04) unfortunately no other labs since then for monitoring purposes. She has back pain still with chronic changes seen on MRI but amenable to Tylenol and not like what brought her into the hospital. Explained that she may have some increased soreness with resuming PT at home. Will see her back in 2 months or sooner if labs/condition indicate earlier follow up.  

## 2018-11-19 NOTE — Progress Notes (Signed)
Patient: [name]  DOB: 31-Dec-1940 MRN: [id number] PCP: Kathrene Alu, MD    Patient Active Problem List   Diagnosis Date Noted  . 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 Vertebral osteomyelitis ([place]) 10/06/2018    Priority: High  . Pressure injury of skin 10/13/2018  . CHF (congestive heart failure) ([place])   . Discitis   . Foot deformity 08/05/2018  . Intertrigo 08/05/2018  . Iron deficiency anemia 05/19/2018  . Cloudy urine 04/03/2018  . Dysuria 11/26/2017  . OSA on CPAP   . Hypokalemia 11/20/2017  . Longstanding persistent atrial fibrillation   . Ischemic cardiomyopathy   . Hypertension   . Hemiparesis ([place])   . Facial weakness, post-stroke   . Chronic systolic CHF (congestive heart failure) ([place])   . Chronic lower back pain   . Chronic kidney disease (CKD), stage III (moderate) (HCC)   . Arthritis   . Venous stasis 09/13/2017  . Open wound of right knee, leg, and ankle with complication 38/45/3646  . Morbid obesity with BMI of 40.0-44.9, adult ([place]) 08/14/2017  . Bilateral leg edema 07/18/2017  . Blood loss anemia 04/09/2017  . Abdominal mass, RLQ (right lower quadrant)   . AKI (acute kidney injury) ([place]) 03/26/2017  . History of CVA with residual deficit 03/26/2017  . Pulmonary hypertension ([place]) 03/26/2017  . Morbid obesity ([place]) 03/26/2017  . Gastrointestinal hemorrhage 03/26/2017  . Rectal bleeding   . Chronic pain syndrome   . Coronary artery disease involving coronary bypass graft of native heart without angina pectoris   . Benign essential HTN   . DM type 2 with diabetic peripheral neuropathy ([place])   . Thrombocytopenia ([place])   . Cerebrovascular accident (CVA) due to embolism of right middle cerebral artery ([place])   . CKD (chronic kidney disease) stage 3, GFR 30-59 ml/min (HCC) 03/30/2016  . Chronic anticoagulation 03/30/2016  . Hyperlipidemia 01/31/2009  . ACC/AHA stage C congestive heart failure due to  ischemic cardiomyopathy ([place]) 01/31/2009  . Chronic systolic heart failure ([place]) 01/31/2009  . Atrial fibrillation ([place]) 01/31/2009  . CAD (coronary artery disease) 05/12/2008     Subjective:  CC:  Hospital follow up actinomyces discitis.   Brief ID Hx:  SIOMARA BURKEL is a 78 y.o. female with  PMHxT2DM, HFrEF (35-40%), CKD3, HTN, HLD, OSA on CPAP, afib on Eliquis, h/o CVA 2017, CAD s/p CABG 2009, morbid obesity.    She was admitted to [facility] on 10[facility]06/2018 with worsening back pain x 4 weeks. MRI findings at the time indicated L2-3 discitis/osteomyelitis with right paraspinal soft tissue inflammation amongst chronic ankylosis and foraminal stenosis. Aspiration of lumbar space revealed actinomyces species. She had 12-18 months before had 4 infected teeth pulled. She completed 6 weeks of IV penicillin through January 7th and PICC line was just removed this morning and discharged back home from SNF.   HPI:  She is with her son today and reports that she is happy to be home from rehab. She is having 7/10 back pain presently but just took tylenol which helps her significantly. She did notice some generalized itching w/o rash during treatment with IV PCN. This has since resolved. Was given medication for this "starting with an R" that helped. No fevers, chills, rashes or diarrhea noted on therapy. Had no trouble with PICC line site when it was in place and since removal has had no bleeding. She uses a walker to walk with and  is getting ready to start physical therapy at home tomorrow.   Review of Systems  Constitutional: Negative for chills, fever, malaise/fatigue and weight loss.  HENT: Negative for sore throat.        No dental problems  Respiratory: Negative for cough and sputum production.   Cardiovascular: Negative for chest pain and leg swelling.  Gastrointestinal: Negative for abdominal pain, diarrhea and vomiting.  Genitourinary: Negative for dysuria and flank pain.  Musculoskeletal:  Positive for back pain. Negative for joint pain, myalgias and neck pain.  Skin: Negative for rash.  Neurological: Negative for dizziness, tingling and headaches.  Psychiatric/Behavioral: Negative for depression and substance abuse. The patient is not nervous/anxious and does not have insomnia.     Past Medical History:  Diagnosis Date  . ACC/AHA stage C congestive heart failure due to ischemic cardiomyopathy ([place]) 01/31/2009   Qualifier: Diagnosis of  By: Olevia Perches, MD, Glenetta Hew   . ANXIETY 01/31/2009   Qualifier: Diagnosis of  By: Lovette Cliche, CNA, Christy    . Arthritis    "qwhere" (03/30/2016)  . Benign essential HTN   . CAD (coronary artery disease) 05/2008   a. s/p CABG in 2009.  Marland Kitchen Cerebrovascular accident (CVA) due to embolism of right middle cerebral artery ([place])   . Chronic anticoagulation   . Chronic kidney disease (CKD), stage III (moderate) (HCC)   . Chronic lower back pain   . Chronic pain syndrome   . Chronic systolic CHF (congestive heart failure) ([place])   . DM type 2 with diabetic peripheral neuropathy ([place])   . Facial weakness, post-stroke   . Gastrointestinal hemorrhage 03/26/2017  . Hemiparesis ([place])   . History of CVA with residual deficit 03/26/2017  . Hyperlipidemia   . Hypertension   . Ischemic cardiomyopathy   . Longstanding persistent atrial fibrillation    a. Postoperative in 2009;  S/P transesophageal echocardiography-guided cardioversion, previously on Amiodarone and Coumadin. b. recurrent in April 2013 -> pt elected rate control strategy, initially Coumadin -> had stroke with subtherapeutic INR in 03/2016 and transitioned to Eliquis.  . Morbid obesity ([place]) 03/26/2017  . OSA on CPAP    severe OSA with AHI 34/hr  . Persistent atrial fibrillation   . Thrombocytopenia ([place])     Outpatient Medications Prior to Visit  Medication Sig Dispense Refill  . acetaminophen (TYLENOL) 650 MG CR tablet Take 650 mg by mouth every 8 (eight) hours as needed for pain.    Marland Kitchen  apixaban (ELIQUIS) 5 MG TABS tablet TAKE ONE (1) TABLET BY MOUTH TWO (2) TIMES DAILY 60 tablet 11  . atorvastatin (LIPITOR) 40 MG tablet TAKE 1 TABLET BY MOUTH  DAILY AT 6 PM. (Patient taking differently: Take 40 mg by mouth daily at 6 PM. ) 90 tablet 1  . carvedilol (COREG) 6.25 MG tablet Take 1 tablet (6.25 mg total) by mouth 2 (two) times daily with a meal. 60 tablet 6  . cholecalciferol (VITAMIN D) 1000 UNITS tablet Take 1,000 Units by mouth 2 (two) times daily.    . ferrous sulfate 325 (65 FE) MG tablet Take 1 tablet (325 mg total) by mouth daily with breakfast. 60 tablet 3  . glucose blood (ONE TOUCH ULTRA TEST) test strip Use three times a day 100 each 3  . Insulin Glargine (LANTUS SOLOSTAR) 100 UNIT/ML Solostar Pen INJECT 17 UNITS INTO SKIN AT BEDTIME 15 mL 3  . Insulin Pen Needle (PEN NEEDLES) 32G X 4 MM MISC 1 each by Does not apply route at  bedtime. Use to inject Lantus each night. 90 each 1  . losartan (COZAAR) 25 MG tablet Take 0.5 tablets (12.5 mg total) by mouth daily. 15 tablet 11  . penicillin G IVPB Inject 4 Million Units into the vein every 6 (six) hours. Indication:  L-spine discitis with actinomyces Last Day of Therapy:  11/18/18 Labs - Once weekly:  CBC/D and BMP, Labs - Every other week:  ESR and CRP 144 Units 0  . rOPINIRole (REQUIP) 0.5 MG tablet TAKE 2 TABLETS BY MOUTH AT  BEDTIME (Patient taking differently: Take 1 mg by mouth at bedtime. ) 180 tablet 0  . torsemide (DEMADEX) 20 MG tablet Take 3 tablets (60 mg total) by mouth every morning AND 2 tablets (40 mg total) every evening. Please cancel all previous orders for current medication. Change in dosage or pill size.. 150 tablet 3  . vitamin B-12 (CYANOCOBALAMIN) 1000 MCG tablet Take 1 tablet (1,000 mcg total) by mouth daily. 90 tablet 0  . nitroGLYCERIN (NITROSTAT) 0.4 MG SL tablet Place 0.4 mg under the tongue every 5 (five) minutes as needed for chest pain (up to 3 doses).      No facility-administered medications  prior to visit.      Allergies  Allergen Reactions  . Benazepril Other (See Comments)    Unknown reaction at age 40-65 - possibly dizziness  . Angiotensin Receptor Blockers Other (See Comments)    Hypotension reaction  . Fe-Succ-C-Thre-B12-Des Stomach Other (See Comments)    Unknown reaction  . Sulfamethoxazole-Trimethoprim Other (See Comments)    Unknown reaction    Social History   Tobacco Use  . Smoking status: Never Smoker  . Smokeless tobacco: Never Used  Substance Use Topics  . Alcohol use: No  . Drug use: No    Objective:   Vitals:   11/19/18 1549  BP: (!) 142/79  Pulse: 69  Temp: 97.7 F (36.5 C)  TempSrc: Oral  Weight: 232 lb (105.2 kg)   Body mass index is 37.45 kg/m.  Physical Exam Vitals signs reviewed.  Constitutional:      Appearance: She is well-developed.     Comments: Seated comfortably in chair. Appears well today.   HENT:     Mouth/Throat:     Mouth: No oral lesions.     Dentition: Normal dentition. No dental abscesses.     Pharynx: No oropharyngeal exudate.  Cardiovascular:     Rate and Rhythm: Normal rate and regular rhythm.     Heart sounds: Normal heart sounds.  Pulmonary:     Effort: Pulmonary effort is normal.     Breath sounds: Normal breath sounds.  Abdominal:     General: There is no distension.     Palpations: Abdomen is soft.     Tenderness: There is no abdominal tenderness.  Musculoskeletal:        General: Tenderness (midline lower back. Non severe. Does not guard. ) present.  Lymphadenopathy:     Cervical: No cervical adenopathy.  Skin:    General: Skin is warm and dry.     Findings: No rash.     Comments: PICC Line site clean and dry with bandage in place from removal.   Neurological:     Mental Status: She is alert and oriented to person, place, and time.  Psychiatric:        Judgment: Judgment normal.     Comments: In good spirits today and engaged in care discussion     Lab Results: Lab Results  Component Value Date   WBC 7.7 10/14/2018   HGB 10.2 (L) 10/14/2018   HCT 31.4 (L) 10/14/2018   MCV 95.7 10/14/2018   PLT 181 10/14/2018    Lab Results  Component Value Date   CREATININE 1.44 (H) 10/14/2018   BUN 36 (H) 10/14/2018   NA 136 10/14/2018   K 3.7 10/14/2018   CL 98 10/14/2018   CO2 28 10/14/2018    Lab Results  Component Value Date   ALT 14 03/25/2017   AST 27 03/25/2017   ALKPHOS 99 03/25/2017   BILITOT 0.8 03/25/2017     Assessment & Plan:   Problem List Items Addressed This Visit      High   Vertebral osteomyelitis ([place]) - Primary    She has completed 6 weeks therapy with IV PCN - transition to oral therapy to continue treatment today. Check CRP, ESR, CBC today. ESR trended down on therapy 80 >> 50 (12/04) unfortunately no other labs since then for monitoring purposes. She has back pain still with chronic changes seen on MRI but amenable to Tylenol and not like what brought her into the hospital. Explained that she may have some increased soreness with resuming PT at home. Will see her back in 2 months or sooner if labs/condition indicate earlier follow up.       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 If intolerant with itching will switch her to doxycycline BID.         I spent greater than 25 minutes with the patient including greater than 50% of the time in face to face counsel of the above.   Janene Madeira, MSN, NP-C [facility] [place] Pager: [phone] Office: [phone]  11/19/18  4:52 PM

## 2018-11-19 NOTE — Patient Instructions (Addendum)
Will continue your treatment with amoxicillin capsules four times a day. Planning 6 months of treatment before we can consider stopping therapy for you but may need treatment for up to a year.   Your lab work today will be helpful to determine adequate treatment of your infection.   If you experience itching with this medication that is unbearable please call to let us know and we can switch you to an alternative.   Please return in 2 months to check in again.

## 2018-11-19 NOTE — Assessment & Plan Note (Signed)
Will need 6-12 months of ongoing therapy with amoxicillin 500 mg QID. If intolerant with itching will switch her to doxycycline BID.

## 2018-11-20 LAB — CBC
HCT: 32.1 % — ABNORMAL LOW (ref 35.0–45.0)
Hemoglobin: 10.5 g/dL — ABNORMAL LOW (ref 11.7–15.5)
MCH: 31.3 pg (ref 27.0–33.0)
MCHC: 32.7 g/dL (ref 32.0–36.0)
MCV: 95.5 fL (ref 80.0–100.0)
MPV: 10 fL (ref 7.5–12.5)
Platelets: 155 10*3/uL (ref 140–400)
RBC: 3.36 10*6/uL — AB (ref 3.80–5.10)
RDW: 12.2 % (ref 11.0–15.0)
WBC: 5.1 10*3/uL (ref 3.8–10.8)

## 2018-11-20 LAB — C-REACTIVE PROTEIN: CRP: 11.1 mg/L — ABNORMAL HIGH (ref <8.0)

## 2018-11-20 LAB — SEDIMENTATION RATE: Sed Rate: 50 mm/h — ABNORMAL HIGH (ref 0–30)

## 2018-11-22 DIAGNOSIS — I25810 Atherosclerosis of coronary artery bypass graft(s) without angina pectoris: Secondary | ICD-10-CM | POA: Diagnosis not present

## 2018-11-22 DIAGNOSIS — I5022 Chronic systolic (congestive) heart failure: Secondary | ICD-10-CM | POA: Diagnosis not present

## 2018-11-22 DIAGNOSIS — G4733 Obstructive sleep apnea (adult) (pediatric): Secondary | ICD-10-CM | POA: Diagnosis not present

## 2018-11-22 DIAGNOSIS — Z794 Long term (current) use of insulin: Secondary | ICD-10-CM | POA: Diagnosis not present

## 2018-11-22 DIAGNOSIS — I4891 Unspecified atrial fibrillation: Secondary | ICD-10-CM | POA: Diagnosis not present

## 2018-11-22 DIAGNOSIS — E1151 Type 2 diabetes mellitus with diabetic peripheral angiopathy without gangrene: Secondary | ICD-10-CM | POA: Diagnosis not present

## 2018-11-22 DIAGNOSIS — M4626 Osteomyelitis of vertebra, lumbar region: Secondary | ICD-10-CM | POA: Diagnosis not present

## 2018-11-22 DIAGNOSIS — L89312 Pressure ulcer of right buttock, stage 2: Secondary | ICD-10-CM | POA: Diagnosis not present

## 2018-11-22 DIAGNOSIS — I130 Hypertensive heart and chronic kidney disease with heart failure and stage 1 through stage 4 chronic kidney disease, or unspecified chronic kidney disease: Secondary | ICD-10-CM | POA: Diagnosis not present

## 2018-11-22 DIAGNOSIS — E1122 Type 2 diabetes mellitus with diabetic chronic kidney disease: Secondary | ICD-10-CM | POA: Diagnosis not present

## 2018-11-22 DIAGNOSIS — M4646 Discitis, unspecified, lumbar region: Secondary | ICD-10-CM | POA: Diagnosis not present

## 2018-11-22 DIAGNOSIS — I872 Venous insufficiency (chronic) (peripheral): Secondary | ICD-10-CM | POA: Diagnosis not present

## 2018-11-22 DIAGNOSIS — E785 Hyperlipidemia, unspecified: Secondary | ICD-10-CM | POA: Diagnosis not present

## 2018-11-22 DIAGNOSIS — Z8673 Personal history of transient ischemic attack (TIA), and cerebral infarction without residual deficits: Secondary | ICD-10-CM | POA: Diagnosis not present

## 2018-11-22 DIAGNOSIS — N183 Chronic kidney disease, stage 3 (moderate): Secondary | ICD-10-CM | POA: Diagnosis not present

## 2018-11-22 DIAGNOSIS — A4289 Other forms of actinomycosis: Secondary | ICD-10-CM | POA: Diagnosis not present

## 2018-11-22 DIAGNOSIS — M25552 Pain in left hip: Secondary | ICD-10-CM | POA: Diagnosis not present

## 2018-11-22 DIAGNOSIS — Z7901 Long term (current) use of anticoagulants: Secondary | ICD-10-CM | POA: Diagnosis not present

## 2018-11-22 DIAGNOSIS — G8929 Other chronic pain: Secondary | ICD-10-CM | POA: Diagnosis not present

## 2018-11-23 DIAGNOSIS — L89312 Pressure ulcer of right buttock, stage 2: Secondary | ICD-10-CM | POA: Diagnosis not present

## 2018-11-23 DIAGNOSIS — M25552 Pain in left hip: Secondary | ICD-10-CM | POA: Diagnosis not present

## 2018-11-23 DIAGNOSIS — M4626 Osteomyelitis of vertebra, lumbar region: Secondary | ICD-10-CM | POA: Diagnosis not present

## 2018-11-23 DIAGNOSIS — I872 Venous insufficiency (chronic) (peripheral): Secondary | ICD-10-CM | POA: Diagnosis not present

## 2018-11-23 DIAGNOSIS — Z794 Long term (current) use of insulin: Secondary | ICD-10-CM | POA: Diagnosis not present

## 2018-11-23 DIAGNOSIS — I25810 Atherosclerosis of coronary artery bypass graft(s) without angina pectoris: Secondary | ICD-10-CM | POA: Diagnosis not present

## 2018-11-23 DIAGNOSIS — E1122 Type 2 diabetes mellitus with diabetic chronic kidney disease: Secondary | ICD-10-CM | POA: Diagnosis not present

## 2018-11-23 DIAGNOSIS — Z8673 Personal history of transient ischemic attack (TIA), and cerebral infarction without residual deficits: Secondary | ICD-10-CM | POA: Diagnosis not present

## 2018-11-23 DIAGNOSIS — Z7901 Long term (current) use of anticoagulants: Secondary | ICD-10-CM | POA: Diagnosis not present

## 2018-11-23 DIAGNOSIS — E1151 Type 2 diabetes mellitus with diabetic peripheral angiopathy without gangrene: Secondary | ICD-10-CM | POA: Diagnosis not present

## 2018-11-23 DIAGNOSIS — M4646 Discitis, unspecified, lumbar region: Secondary | ICD-10-CM | POA: Diagnosis not present

## 2018-11-23 DIAGNOSIS — E785 Hyperlipidemia, unspecified: Secondary | ICD-10-CM | POA: Diagnosis not present

## 2018-11-23 DIAGNOSIS — G4733 Obstructive sleep apnea (adult) (pediatric): Secondary | ICD-10-CM | POA: Diagnosis not present

## 2018-11-23 DIAGNOSIS — I5022 Chronic systolic (congestive) heart failure: Secondary | ICD-10-CM | POA: Diagnosis not present

## 2018-11-23 DIAGNOSIS — I4891 Unspecified atrial fibrillation: Secondary | ICD-10-CM | POA: Diagnosis not present

## 2018-11-23 DIAGNOSIS — G8929 Other chronic pain: Secondary | ICD-10-CM | POA: Diagnosis not present

## 2018-11-23 DIAGNOSIS — N183 Chronic kidney disease, stage 3 (moderate): Secondary | ICD-10-CM | POA: Diagnosis not present

## 2018-11-23 DIAGNOSIS — I130 Hypertensive heart and chronic kidney disease with heart failure and stage 1 through stage 4 chronic kidney disease, or unspecified chronic kidney disease: Secondary | ICD-10-CM | POA: Diagnosis not present

## 2018-11-23 DIAGNOSIS — A4289 Other forms of actinomycosis: Secondary | ICD-10-CM | POA: Diagnosis not present

## 2018-11-24 ENCOUNTER — Telehealth: Payer: Self-pay | Admitting: *Deleted

## 2018-11-24 ENCOUNTER — Telehealth: Payer: Self-pay

## 2018-11-24 ENCOUNTER — Ambulatory Visit (INDEPENDENT_AMBULATORY_CARE_PROVIDER_SITE_OTHER): Payer: Medicare Other | Admitting: Podiatry

## 2018-11-24 DIAGNOSIS — M2042 Other hammer toe(s) (acquired), left foot: Secondary | ICD-10-CM

## 2018-11-24 DIAGNOSIS — I4891 Unspecified atrial fibrillation: Secondary | ICD-10-CM | POA: Diagnosis not present

## 2018-11-24 DIAGNOSIS — M4626 Osteomyelitis of vertebra, lumbar region: Secondary | ICD-10-CM | POA: Diagnosis not present

## 2018-11-24 DIAGNOSIS — B351 Tinea unguium: Secondary | ICD-10-CM | POA: Diagnosis not present

## 2018-11-24 DIAGNOSIS — G8929 Other chronic pain: Secondary | ICD-10-CM | POA: Diagnosis not present

## 2018-11-24 DIAGNOSIS — M2041 Other hammer toe(s) (acquired), right foot: Secondary | ICD-10-CM | POA: Diagnosis not present

## 2018-11-24 DIAGNOSIS — G4733 Obstructive sleep apnea (adult) (pediatric): Secondary | ICD-10-CM | POA: Diagnosis not present

## 2018-11-24 DIAGNOSIS — M2012 Hallux valgus (acquired), left foot: Secondary | ICD-10-CM | POA: Diagnosis not present

## 2018-11-24 DIAGNOSIS — N179 Acute kidney failure, unspecified: Secondary | ICD-10-CM | POA: Diagnosis not present

## 2018-11-24 DIAGNOSIS — Z794 Long term (current) use of insulin: Secondary | ICD-10-CM | POA: Diagnosis not present

## 2018-11-24 DIAGNOSIS — M4646 Discitis, unspecified, lumbar region: Secondary | ICD-10-CM | POA: Diagnosis not present

## 2018-11-24 DIAGNOSIS — I872 Venous insufficiency (chronic) (peripheral): Secondary | ICD-10-CM | POA: Diagnosis not present

## 2018-11-24 DIAGNOSIS — E1151 Type 2 diabetes mellitus with diabetic peripheral angiopathy without gangrene: Secondary | ICD-10-CM

## 2018-11-24 DIAGNOSIS — M2011 Hallux valgus (acquired), right foot: Secondary | ICD-10-CM | POA: Diagnosis not present

## 2018-11-24 DIAGNOSIS — A4289 Other forms of actinomycosis: Secondary | ICD-10-CM | POA: Diagnosis not present

## 2018-11-24 DIAGNOSIS — I25810 Atherosclerosis of coronary artery bypass graft(s) without angina pectoris: Secondary | ICD-10-CM | POA: Diagnosis not present

## 2018-11-24 DIAGNOSIS — Z7901 Long term (current) use of anticoagulants: Secondary | ICD-10-CM | POA: Diagnosis not present

## 2018-11-24 DIAGNOSIS — E1122 Type 2 diabetes mellitus with diabetic chronic kidney disease: Secondary | ICD-10-CM | POA: Diagnosis not present

## 2018-11-24 DIAGNOSIS — E785 Hyperlipidemia, unspecified: Secondary | ICD-10-CM | POA: Diagnosis not present

## 2018-11-24 DIAGNOSIS — Z8673 Personal history of transient ischemic attack (TIA), and cerebral infarction without residual deficits: Secondary | ICD-10-CM | POA: Diagnosis not present

## 2018-11-24 DIAGNOSIS — L89312 Pressure ulcer of right buttock, stage 2: Secondary | ICD-10-CM | POA: Diagnosis not present

## 2018-11-24 DIAGNOSIS — I5022 Chronic systolic (congestive) heart failure: Secondary | ICD-10-CM | POA: Diagnosis not present

## 2018-11-24 DIAGNOSIS — I130 Hypertensive heart and chronic kidney disease with heart failure and stage 1 through stage 4 chronic kidney disease, or unspecified chronic kidney disease: Secondary | ICD-10-CM | POA: Diagnosis not present

## 2018-11-24 DIAGNOSIS — N183 Chronic kidney disease, stage 3 (moderate): Secondary | ICD-10-CM | POA: Diagnosis not present

## 2018-11-24 DIAGNOSIS — M25552 Pain in left hip: Secondary | ICD-10-CM | POA: Diagnosis not present

## 2018-11-24 NOTE — Telephone Encounter (Signed)
Ryan, PT with [facility] called for verbal orders:  PT 2x/week x 3 weeks  Call back [phone] (this is confidential VM and ok to leave message, please leave name and title if message left.)  Danley Danker, RN [facility] RN)

## 2018-11-24 NOTE — Telephone Encounter (Signed)
Is she doing any new physical therapy at all?  Fevers? And did she continue her antibiotics by mouth as we discussed?  He inflammatory markers look improved and I think we are doing a good job treating her infection. I would rest for the next 2 days and see how her pain continues. If she continues with pain I would have her come in so I can see her and consider repeating some lab work for her.   Thank you!

## 2018-11-24 NOTE — Telephone Encounter (Signed)
Morey Hummingbird, RN with [facility] called for verbal orders for nursing:  1x/week x 1, 2x/week x 2, 1x/week x 2, 2 PRN visits.  Also, PT visits, 2x/week x 3  Call back [phone], ok to leave message.  Danley Danker, RN [facility] RN)

## 2018-11-24 NOTE — Telephone Encounter (Signed)
Called patient to get more information and she reports no fevers, no falls and she has not started physical therapy yet. She also reports that she has started her oral antibiotics but she is having increased pain. Per Colletta Maryland note advised her to rest over the next couple of days to see how her pain responds and if she still has pain to call back to be seen. I will let Colletta Maryland know and we will give her a call back if she needs to know something different.

## 2018-11-24 NOTE — Telephone Encounter (Signed)
Patient nurse called to report that she has increasing Left Hip pain that goes to her back as well. Is has increased over the past 48 hours and she is just concerned because the last time it was so bad. Advised will let the provider know and give her a call back once she responds.

## 2018-11-24 NOTE — Telephone Encounter (Signed)
That would be lovely if we can call her to check in to see how she is doing. Thank you.

## 2018-11-24 NOTE — Progress Notes (Signed)
Subjective:  Patient ID: [name], female    DOB: [birth year],  MRN: [id number]  No chief complaint on file.  78 y.o. female presents  for diabetic foot care. Last AMBS was 116. Denies numbness and tingling in their feet. Denies cramping in legs and thighs.  Review of Systems: Negative except as noted in the HPI. Denies N/V/F/Ch.  Past Medical History:  Diagnosis Date  . ACC/AHA stage C congestive heart failure due to ischemic cardiomyopathy ([place]) 01/31/2009   Qualifier: Diagnosis of  By: Olevia Perches, MD, Glenetta Hew   . ANXIETY 01/31/2009   Qualifier: Diagnosis of  By: Lovette Cliche, CNA, Christy    . Arthritis    "qwhere" (03/30/2016)  . Benign essential HTN   . CAD (coronary artery disease) 05/2008   a. s/p CABG in 2009.  Marland Kitchen Cerebrovascular accident (CVA) due to embolism of right middle cerebral artery ([place])   . Chronic anticoagulation   . Chronic kidney disease (CKD), stage III (moderate) (HCC)   . Chronic lower back pain   . Chronic pain syndrome   . Chronic systolic CHF (congestive heart failure) ([place])   . DM type 2 with diabetic peripheral neuropathy ([place])   . Facial weakness, post-stroke   . Gastrointestinal hemorrhage 03/26/2017  . Hemiparesis ([place])   . History of CVA with residual deficit 03/26/2017  . Hyperlipidemia   . Hypertension   . Ischemic cardiomyopathy   . Longstanding persistent atrial fibrillation    a. Postoperative in 2009;  S/P transesophageal echocardiography-guided cardioversion, previously on Amiodarone and Coumadin. b. recurrent in April 2013 -> pt elected rate control strategy, initially Coumadin -> had stroke with subtherapeutic INR in 03/2016 and transitioned to Eliquis.  . Morbid obesity ([place]) 03/26/2017  . OSA on CPAP    severe OSA with AHI 34/hr  . Persistent atrial fibrillation   . Thrombocytopenia (HCC)     Current Outpatient Medications:  .  acetaminophen (TYLENOL) 650 MG CR tablet, Take 650 mg by mouth every 8 (eight) hours as needed for  pain., Disp: , Rfl:  .  amoxicillin (AMOXIL) 500 MG capsule, Take 1 capsule (500 mg total) by mouth 4 (four) times daily., Disp: 120 capsule, Rfl: 1 .  apixaban (ELIQUIS) 5 MG TABS tablet, TAKE ONE (1) TABLET BY MOUTH TWO (2) TIMES DAILY, Disp: 60 tablet, Rfl: 11 .  atorvastatin (LIPITOR) 40 MG tablet, TAKE 1 TABLET BY MOUTH  DAILY AT 6 PM. (Patient taking differently: Take 40 mg by mouth daily at 6 PM. ), Disp: 90 tablet, Rfl: 1 .  carvedilol (COREG) 6.25 MG tablet, Take 1 tablet (6.25 mg total) by mouth 2 (two) times daily with a meal., Disp: 60 tablet, Rfl: 6 .  cholecalciferol (VITAMIN D) 1000 UNITS tablet, Take 1,000 Units by mouth 2 (two) times daily., Disp: , Rfl:  .  ferrous sulfate 325 (65 FE) MG tablet, Take 1 tablet (325 mg total) by mouth daily with breakfast., Disp: 60 tablet, Rfl: 3 .  glucose blood (ONE TOUCH ULTRA TEST) test strip, Use three times a day, Disp: 100 each, Rfl: 3 .  Insulin Glargine (LANTUS SOLOSTAR) 100 UNIT/ML Solostar Pen, INJECT 17 UNITS INTO SKIN AT BEDTIME, Disp: 15 mL, Rfl: 3 .  Insulin Pen Needle (PEN NEEDLES) 32G X 4 MM MISC, 1 each by Does not apply route at bedtime. Use to inject Lantus each night., Disp: 90 each, Rfl: 1 .  losartan (COZAAR) 25 MG tablet, Take 0.5 tablets (12.5 mg total) by mouth daily., Disp:  15 tablet, Rfl: 11 .  nitroGLYCERIN (NITROSTAT) 0.4 MG SL tablet, Place 0.4 mg under the tongue every 5 (five) minutes as needed for chest pain (up to 3 doses). , Disp: , Rfl:  .  rOPINIRole (REQUIP) 0.5 MG tablet, TAKE 2 TABLETS BY MOUTH AT  BEDTIME (Patient taking differently: Take 1 mg by mouth at bedtime. ), Disp: 180 tablet, Rfl: 0 .  torsemide (DEMADEX) 20 MG tablet, Take 3 tablets (60 mg total) by mouth every morning AND 2 tablets (40 mg total) every evening. Please cancel all previous orders for current medication. Change in dosage or pill size.., Disp: 150 tablet, Rfl: 3 .  vitamin B-12 (CYANOCOBALAMIN) 1000 MCG tablet, Take 1 tablet (1,000 mcg  total) by mouth daily., Disp: 90 tablet, Rfl: 0  Social History   Tobacco Use  Smoking Status Never Smoker  Smokeless Tobacco Never Used    Allergies  Allergen Reactions  . Benazepril Other (See Comments)    Unknown reaction at age 61-65 - possibly dizziness  . Angiotensin Receptor Blockers Other (See Comments)    Hypotension reaction  . Fe-Succ-C-Thre-B12-Des Stomach Other (See Comments)    Unknown reaction  . Sulfamethoxazole-Trimethoprim Other (See Comments)    Unknown reaction   Objective:   There were no vitals filed for this visit. There is no height or weight on file to calculate BMI. Constitutional Well developed. Well nourished.  Vascular Dorsalis pedis pulses present 1+ bilaterally  Posterior tibial pulses absent bilaterally  Pedal hair growth diminished. Capillary refill normal to all digits.  No cyanosis or clubbing noted. Varicosities present bilateral  Neurologic Normal speech. Oriented to person, place, and time. Epicritic sensation to light touch grossly present bilaterally. Protective sensation with 5.07 monofilament  present bilaterally. Vibratory sensation present bilaterally.  Dermatologic Nails elongated, thickened, dystrophic. No open wounds. No skin lesions.  Orthopedic: Normal joint ROM without pain or crepitus bilaterally. Hallux abductovalgus deformity bilaterally, lesser digital contractures bilat Dorsal midfoot osteophytes without pain to palpation   Assessment:   1. Diabetes mellitus type 2 with peripheral artery disease ([place])   2. Onychomycosis   3. Acquired hallux valgus of both feet   4. Hammer toes of both feet    Plan:  Patient was evaluated and treated and all questions answered.  Diabetes with PAD, Onychomycosis; HAV deformity with hammertoe formation -Educated on diabetic footcare. Diabetic risk level 1 -Nails x10 debrided sharply and manually with large nail nipper and rotary burr.  -DM shoes dispensed today. With 3 pairs  custom molded inserts   Procedure: Nail Debridement Rationale: Patient meets criteria for routine foot care due to PAD Type of Debridement: manual, sharp debridement. Instrumentation: Nail nipper, rotary burr. Number of Nails: 10   Return in about 3 months (around 02/23/2019) for Diabetic Foot Care.

## 2018-11-24 NOTE — Patient Instructions (Signed)

## 2018-11-25 NOTE — Telephone Encounter (Signed)
That is strange she does not recall conversation at all. I don't recall her having any listed or known cognitive problems... I believe she has a son that came with her if we can reach out to him to come with her and check in on her. But I believe she is living with him.  Thank you for arranging.

## 2018-11-25 NOTE — Telephone Encounter (Signed)
Patient called about her back pain. She does not remember our conversation from yesterday, any of it. She reports that the pain is back and she is really having a hard time. She does not know what to do and she wants to be seen. Gave her an appointment for Thursday 11/27/18 to see [name]. She could not come tomorrow because she has another appointment and has company coming in the afternoon so Thursday is the only day she can come. Advised will let [name] know and we will see her then.

## 2018-11-26 DIAGNOSIS — I129 Hypertensive chronic kidney disease with stage 1 through stage 4 chronic kidney disease, or unspecified chronic kidney disease: Secondary | ICD-10-CM | POA: Diagnosis not present

## 2018-11-26 DIAGNOSIS — M4646 Discitis, unspecified, lumbar region: Secondary | ICD-10-CM | POA: Diagnosis not present

## 2018-11-26 DIAGNOSIS — I5022 Chronic systolic (congestive) heart failure: Secondary | ICD-10-CM | POA: Diagnosis not present

## 2018-11-26 DIAGNOSIS — E1122 Type 2 diabetes mellitus with diabetic chronic kidney disease: Secondary | ICD-10-CM | POA: Diagnosis not present

## 2018-11-26 DIAGNOSIS — N183 Chronic kidney disease, stage 3 (moderate): Secondary | ICD-10-CM | POA: Diagnosis not present

## 2018-11-26 NOTE — Telephone Encounter (Signed)
Verbal orders done for RN and PT today.  Thanks!

## 2018-11-27 ENCOUNTER — Ambulatory Visit: Payer: Medicare Other | Admitting: Infectious Diseases

## 2018-11-27 ENCOUNTER — Telehealth: Payer: Self-pay | Admitting: *Deleted

## 2018-11-27 DIAGNOSIS — I25810 Atherosclerosis of coronary artery bypass graft(s) without angina pectoris: Secondary | ICD-10-CM | POA: Diagnosis not present

## 2018-11-27 DIAGNOSIS — M4646 Discitis, unspecified, lumbar region: Secondary | ICD-10-CM | POA: Diagnosis not present

## 2018-11-27 DIAGNOSIS — Z794 Long term (current) use of insulin: Secondary | ICD-10-CM | POA: Diagnosis not present

## 2018-11-27 DIAGNOSIS — M25552 Pain in left hip: Secondary | ICD-10-CM | POA: Diagnosis not present

## 2018-11-27 DIAGNOSIS — N183 Chronic kidney disease, stage 3 (moderate): Secondary | ICD-10-CM | POA: Diagnosis not present

## 2018-11-27 DIAGNOSIS — I5022 Chronic systolic (congestive) heart failure: Secondary | ICD-10-CM | POA: Diagnosis not present

## 2018-11-27 DIAGNOSIS — Z7901 Long term (current) use of anticoagulants: Secondary | ICD-10-CM | POA: Diagnosis not present

## 2018-11-27 DIAGNOSIS — I872 Venous insufficiency (chronic) (peripheral): Secondary | ICD-10-CM | POA: Diagnosis not present

## 2018-11-27 DIAGNOSIS — Z8673 Personal history of transient ischemic attack (TIA), and cerebral infarction without residual deficits: Secondary | ICD-10-CM | POA: Diagnosis not present

## 2018-11-27 DIAGNOSIS — G8929 Other chronic pain: Secondary | ICD-10-CM | POA: Diagnosis not present

## 2018-11-27 DIAGNOSIS — G4733 Obstructive sleep apnea (adult) (pediatric): Secondary | ICD-10-CM | POA: Diagnosis not present

## 2018-11-27 DIAGNOSIS — I4891 Unspecified atrial fibrillation: Secondary | ICD-10-CM | POA: Diagnosis not present

## 2018-11-27 DIAGNOSIS — I130 Hypertensive heart and chronic kidney disease with heart failure and stage 1 through stage 4 chronic kidney disease, or unspecified chronic kidney disease: Secondary | ICD-10-CM | POA: Diagnosis not present

## 2018-11-27 DIAGNOSIS — A4289 Other forms of actinomycosis: Secondary | ICD-10-CM | POA: Diagnosis not present

## 2018-11-27 DIAGNOSIS — L89312 Pressure ulcer of right buttock, stage 2: Secondary | ICD-10-CM | POA: Diagnosis not present

## 2018-11-27 DIAGNOSIS — E1122 Type 2 diabetes mellitus with diabetic chronic kidney disease: Secondary | ICD-10-CM | POA: Diagnosis not present

## 2018-11-27 DIAGNOSIS — M4626 Osteomyelitis of vertebra, lumbar region: Secondary | ICD-10-CM | POA: Diagnosis not present

## 2018-11-27 DIAGNOSIS — E1151 Type 2 diabetes mellitus with diabetic peripheral angiopathy without gangrene: Secondary | ICD-10-CM | POA: Diagnosis not present

## 2018-11-27 DIAGNOSIS — E785 Hyperlipidemia, unspecified: Secondary | ICD-10-CM | POA: Diagnosis not present

## 2018-11-27 NOTE — Telephone Encounter (Signed)
I agree with ER evaluation. If she has had that much pain and now weakness she needs to be re-scanned to evaluate if there is any worsening of disc disease or new abscess formation. I don't think she should wait for outpatient scan considering her high risk for falls.  Thank you

## 2018-11-27 NOTE — Telephone Encounter (Signed)
She alternatively could have some pyelo/UTI-nonetheless several things to look for in the ER. I am hopeful that if things worsen they will take her. She can also always call EMS is she cannot wait. Sounds as if offering am appointment with another provider here tomorrow is not going to be something she will accept either.

## 2018-11-27 NOTE — Telephone Encounter (Signed)
[name] the PT from [facility] called to report that the patient had an episode last night where the pain in her hip was so bad and her leg went numb that her family had to call EMS for assistance and she is still in pain and will not make her appointment today. He also wants someone to call the patient and let her know what she should do. Advised him [name] is out of the office and if she is in that much pain she should call her PCP or be taken to the ED. Advised will send a message to provider and someone will call and check on patient soon.

## 2018-11-27 NOTE — Telephone Encounter (Signed)
Called the patient to check on her pain and advise to follow up at ED and she advised she does not have a ride. She advised her son is working, her daughter is there but has an appointment soon and she has an office visit in the morning at 930 am with her PCP. She advised she thinks she can make it until then. She reports having trouble making it to the restroom. Advised her if she starts to have pain she can not handle, shortness of breath or she falls she should call 911 and be taken to the hospital. She advised she understands.

## 2018-11-28 ENCOUNTER — Other Ambulatory Visit: Payer: Self-pay

## 2018-11-28 ENCOUNTER — Ambulatory Visit (INDEPENDENT_AMBULATORY_CARE_PROVIDER_SITE_OTHER): Payer: Medicare Other | Admitting: Infectious Diseases

## 2018-11-28 ENCOUNTER — Ambulatory Visit (INDEPENDENT_AMBULATORY_CARE_PROVIDER_SITE_OTHER): Payer: Medicare Other | Admitting: Family Medicine

## 2018-11-28 ENCOUNTER — Encounter: Payer: Self-pay | Admitting: Family Medicine

## 2018-11-28 VITALS — BP 128/56 | HR 66 | Temp 98.0°F | Wt 230.0 lb

## 2018-11-28 VITALS — BP 150/79 | HR 69 | Temp 97.5°F | Wt 230.8 lb

## 2018-11-28 DIAGNOSIS — E1151 Type 2 diabetes mellitus with diabetic peripheral angiopathy without gangrene: Secondary | ICD-10-CM | POA: Diagnosis not present

## 2018-11-28 DIAGNOSIS — Z794 Long term (current) use of insulin: Secondary | ICD-10-CM | POA: Diagnosis not present

## 2018-11-28 DIAGNOSIS — Z7901 Long term (current) use of anticoagulants: Secondary | ICD-10-CM | POA: Diagnosis not present

## 2018-11-28 DIAGNOSIS — M25552 Pain in left hip: Secondary | ICD-10-CM | POA: Diagnosis not present

## 2018-11-28 DIAGNOSIS — M4620 Osteomyelitis of vertebra, site unspecified: Secondary | ICD-10-CM

## 2018-11-28 DIAGNOSIS — M4647 Discitis, unspecified, lumbosacral region: Secondary | ICD-10-CM | POA: Diagnosis not present

## 2018-11-28 DIAGNOSIS — M4626 Osteomyelitis of vertebra, lumbar region: Secondary | ICD-10-CM | POA: Diagnosis not present

## 2018-11-28 DIAGNOSIS — A429 Actinomycosis, unspecified: Secondary | ICD-10-CM | POA: Diagnosis not present

## 2018-11-28 DIAGNOSIS — G4733 Obstructive sleep apnea (adult) (pediatric): Secondary | ICD-10-CM | POA: Diagnosis not present

## 2018-11-28 DIAGNOSIS — G8929 Other chronic pain: Secondary | ICD-10-CM | POA: Diagnosis not present

## 2018-11-28 DIAGNOSIS — L89312 Pressure ulcer of right buttock, stage 2: Secondary | ICD-10-CM | POA: Diagnosis not present

## 2018-11-28 DIAGNOSIS — E1142 Type 2 diabetes mellitus with diabetic polyneuropathy: Secondary | ICD-10-CM

## 2018-11-28 DIAGNOSIS — A4289 Other forms of actinomycosis: Secondary | ICD-10-CM | POA: Diagnosis not present

## 2018-11-28 DIAGNOSIS — I4891 Unspecified atrial fibrillation: Secondary | ICD-10-CM | POA: Diagnosis not present

## 2018-11-28 DIAGNOSIS — L89152 Pressure ulcer of sacral region, stage 2: Secondary | ICD-10-CM | POA: Diagnosis not present

## 2018-11-28 DIAGNOSIS — Z8673 Personal history of transient ischemic attack (TIA), and cerebral infarction without residual deficits: Secondary | ICD-10-CM | POA: Diagnosis not present

## 2018-11-28 DIAGNOSIS — M4646 Discitis, unspecified, lumbar region: Secondary | ICD-10-CM | POA: Diagnosis not present

## 2018-11-28 DIAGNOSIS — I5022 Chronic systolic (congestive) heart failure: Secondary | ICD-10-CM | POA: Diagnosis not present

## 2018-11-28 DIAGNOSIS — I25810 Atherosclerosis of coronary artery bypass graft(s) without angina pectoris: Secondary | ICD-10-CM | POA: Diagnosis not present

## 2018-11-28 DIAGNOSIS — I130 Hypertensive heart and chronic kidney disease with heart failure and stage 1 through stage 4 chronic kidney disease, or unspecified chronic kidney disease: Secondary | ICD-10-CM | POA: Diagnosis not present

## 2018-11-28 DIAGNOSIS — I872 Venous insufficiency (chronic) (peripheral): Secondary | ICD-10-CM | POA: Diagnosis not present

## 2018-11-28 DIAGNOSIS — E1122 Type 2 diabetes mellitus with diabetic chronic kidney disease: Secondary | ICD-10-CM | POA: Diagnosis not present

## 2018-11-28 DIAGNOSIS — N183 Chronic kidney disease, stage 3 (moderate): Secondary | ICD-10-CM | POA: Diagnosis not present

## 2018-11-28 DIAGNOSIS — E785 Hyperlipidemia, unspecified: Secondary | ICD-10-CM | POA: Diagnosis not present

## 2018-11-28 LAB — POCT GLYCOSYLATED HEMOGLOBIN (HGB A1C): HbA1c, POC (prediabetic range): 5.9 % (ref 5.7–6.4)

## 2018-11-28 MED ORDER — DOXYCYCLINE HYCLATE 100 MG PO TABS: 100.0000 mg | ORAL_TABLET | Freq: Two times a day (BID) | ORAL | 5 refills | Status: DC

## 2018-11-28 NOTE — Patient Instructions (Signed)
It was nice seeing you today [name]!  You are doing a great job with your blood pressure, diabetes, and cholesterol control.  We may scale back your insulin a little bit after you get through the antibiotic course for your back.  Please follow-up with infectious disease since they may want to make changes to your antibiotics given your worsening back pain.  Good luck with therapy, I hope that it helps you have better range of motion.  I like to see you back in about 3 months to check on how you are doing.  If you have any questions or concerns, please feel free to call the clinic.   Be well,  Dr. Shan Levans

## 2018-11-28 NOTE — Telephone Encounter (Signed)
Verbal orders are done, thanks!

## 2018-11-28 NOTE — Assessment & Plan Note (Addendum)
I am concerned that her current antibiotic regimen may not be sufficient in treating her discitis.  Advised patient to call her infectious disease clinic to get their opinion on this and see if she needs to switch to another antibiotic.  They plan to call them after our appointment.  We will also allow infectious disease to order inflammatory markers, since I do not want to disrupt their schedule in trending these.

## 2018-11-28 NOTE — Assessment & Plan Note (Signed)
Discussed measures to off-load. I gave her some alevyn foam dressings today and asked her to look into ordering them for home use to help treat this as well.

## 2018-11-28 NOTE — Patient Instructions (Addendum)
Will repeat some blood work for you today to see if this helps me understand your pain with relation to over-exertion of muscles or if this is truly worsening of disease.   I am reassured that you don't have any pain with pushing over the bones on your back that there is no worsened inflammation   For the pain -   Increase your tylenol to TWO tablets twice a day.   You can take this 3 times a day if needed.   If you continue to have numbness in your leg please call to let me know.   I would like to see you back in the bed for sleep to help with reducing some pressure on your spine if you can - but at least reclining back and elevating legs in chair will be helpful. I am worried however that this may make your pressure sore worse. Please [place] a pillow and the foam dressings we gave you to protect this from happening.   We are going to try to switch your antibiotic to DOXYCYCLINE (stop your amoxicillin)   Please take twice a day with food  Please take your iron supplement at lunch between doses (Doxycycline in AM with breakfast and in PM with dinner)   Main side effect is nausea  Can also make you very sensitive to the sun   Please come back in 4 weeks.

## 2018-11-28 NOTE — Assessment & Plan Note (Signed)
As outlined above - switch to doxycycline for easier dosing. Needs 6-12 months

## 2018-11-28 NOTE — Progress Notes (Signed)
Patient: [name]  DOB: [birth year] MRN: [id number] PCP: [name] Alu, MD    Patient Active Problem List   Diagnosis Date Noted  . 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 Vertebral osteomyelitis ([place]) 10/06/2018    Priority: High  . Decubitus ulcer of coccyx, stage 2 ([place]) 10/13/2018  . CHF (congestive heart failure) ([place])   . Discitis   . Foot deformity 08/05/2018  . Intertrigo 08/05/2018  . Iron deficiency anemia 05/19/2018  . Cloudy urine 04/03/2018  . Dysuria 11/26/2017  . OSA on CPAP   . Hypokalemia 11/20/2017  . Longstanding persistent atrial fibrillation   . Ischemic cardiomyopathy   . Hypertension   . Hemiparesis ([place])   . Facial weakness, post-stroke   . Chronic systolic CHF (congestive heart failure) ([place])   . Chronic lower back pain   . Arthritis   . Venous stasis 09/13/2017  . Morbid obesity with BMI of 40.0-44.9, adult ([place]) 08/14/2017  . Bilateral leg edema 07/18/2017  . Blood loss anemia 04/09/2017  . History of CVA with residual deficit 03/26/2017  . Pulmonary hypertension ([place]) 03/26/2017  . Morbid obesity ([place]) 03/26/2017  . Gastrointestinal hemorrhage 03/26/2017  . Rectal bleeding   . Chronic pain syndrome   . Coronary artery disease involving coronary bypass graft of native heart without angina pectoris   . Benign essential HTN   . DM type 2 with diabetic peripheral neuropathy ([place])   . Thrombocytopenia ([place])   . Cerebrovascular accident (CVA) due to embolism of right middle cerebral artery ([place])   . CKD (chronic kidney disease) stage 3, GFR 30-59 ml/min (HCC) 03/30/2016  . Chronic anticoagulation 03/30/2016  . Hyperlipidemia 01/31/2009  . ACC/AHA stage C congestive heart failure due to ischemic cardiomyopathy ([place]) 01/31/2009  . Chronic systolic heart failure ([place]) 01/31/2009  . Atrial fibrillation ([place]) 01/31/2009  . CAD (coronary artery disease) 05/12/2008     Subjective:  CC:  Walk in  visit for acutely worsening pain following stopping IV antibiotics and switching to PO.   Brief ID Hx:  [name] is a 78 y.o. female with  PMHxT2DM, HFrEF (35-40%), CKD3, HTN, HLD, OSA on CPAP, afib on Eliquis, h/o CVA 2017, CAD s/p CABG 2009, morbid obesity.    Admitted to [facility] on 10/06/2018 with worsening back pain x 4 weeks. MRI findings at the time indicated L2-3 discitis/osteomyelitis with right paraspinal soft tissue inflammation amongst chronic ankylosis and foraminal stenosis. Aspiration of lumbar space revealed actinomyces species. She had 12-18 months before had 4 infected teeth pulled. She completed 6 weeks of IV penicillin through January 7th and PICC line was just removed this morning and discharged back home from SNF.    HPI:  Back pain has been over the last 4 or 5 days been progressively escalating. She has since been discharged home from her rehab facility and has been walking more than she had at the SNF. Went to a funeral this past weekend with her walker. She has not started physical therapy in the home yet. She has had no fevers or chills. She has not been able to tolerate lying flat in her bed because she cannot roll from side to side - this has caused her to sleep in a recliner now for 3 nights now. At 2:00 am yesterday she was unable to get out of bed or move due to LLE numbness from knee to ankle (leg was caught in recliner) -  had to call EMS to get her out of chair. Currently rates pain as a 6/10, intermittent sharp pain, worsened with activity. She takes one 650 mg tylenol twice a day which previously did work to control it. She has been taking her amoxicillin as prescribed 1 capsule QID. No nausea/vomiting or other reason she has not been absorbing this.   Review of Systems  Constitutional: Negative for chills, fever, malaise/fatigue and weight loss.  HENT: Negative for sore throat.   Respiratory: Negative for cough and sputum production.   Cardiovascular:  Negative for chest pain and leg swelling.  Gastrointestinal: Negative for abdominal pain, diarrhea and vomiting.  Genitourinary: Negative for dysuria and flank pain.  Musculoskeletal: Positive for back pain. Negative for joint pain, myalgias and neck pain.  Skin: Negative for rash.  Neurological: Positive for weakness (LLE numbness one day ). Negative for dizziness, tingling and headaches.    Past Medical History:  Diagnosis Date  . ACC/AHA stage C congestive heart failure due to ischemic cardiomyopathy ([place]) 01/31/2009   Qualifier: Diagnosis of  By: Olevia Perches, MD, Glenetta Hew   . ANXIETY 01/31/2009   Qualifier: Diagnosis of  By: Lovette Cliche, CNA, Christy    . Arthritis    "qwhere" (03/30/2016)  . Benign essential HTN   . CAD (coronary artery disease) 05/2008   a. s/p CABG in 2009.  Marland Kitchen Cerebrovascular accident (CVA) due to embolism of right middle cerebral artery ([place])   . Chronic anticoagulation   . Chronic kidney disease (CKD), stage III (moderate) (HCC)   . Chronic lower back pain   . Chronic pain syndrome   . Chronic systolic CHF (congestive heart failure) ([place])   . DM type 2 with diabetic peripheral neuropathy ([place])   . Facial weakness, post-stroke   . Gastrointestinal hemorrhage 03/26/2017  . Hemiparesis ([place])   . History of CVA with residual deficit 03/26/2017  . Hyperlipidemia   . Hypertension   . Ischemic cardiomyopathy   . Longstanding persistent atrial fibrillation    a. Postoperative in 2009;  S/P transesophageal echocardiography-guided cardioversion, previously on Amiodarone and Coumadin. b. recurrent in April 2013 -> pt elected rate control strategy, initially Coumadin -> had stroke with subtherapeutic INR in 03/2016 and transitioned to Eliquis.  . Morbid obesity ([place]) 03/26/2017  . OSA on CPAP    severe OSA with AHI 34/hr  . Persistent atrial fibrillation   . Thrombocytopenia ([place])     Outpatient Medications Prior to Visit  Medication Sig Dispense Refill  .  acetaminophen (TYLENOL) 650 MG CR tablet Take 650 mg by mouth every 8 (eight) hours as needed for pain.    Marland Kitchen apixaban (ELIQUIS) 5 MG TABS tablet TAKE ONE (1) TABLET BY MOUTH TWO (2) TIMES DAILY 60 tablet 11  . atorvastatin (LIPITOR) 40 MG tablet TAKE 1 TABLET BY MOUTH  DAILY AT 6 PM. (Patient taking differently: Take 40 mg by mouth daily at 6 PM. ) 90 tablet 1  . carvedilol (COREG) 6.25 MG tablet Take 1 tablet (6.25 mg total) by mouth 2 (two) times daily with a meal. 60 tablet 6  . cholecalciferol (VITAMIN D) 1000 UNITS tablet Take 1,000 Units by mouth 2 (two) times daily.    . ferrous sulfate 325 (65 FE) MG tablet Take 1 tablet (325 mg total) by mouth daily with breakfast. 60 tablet 3  . glucose blood (ONE TOUCH ULTRA TEST) test strip Use three times a day 100 each 3  . Insulin Glargine (LANTUS SOLOSTAR) 100 UNIT/ML Solostar Pen  INJECT 17 UNITS INTO SKIN AT BEDTIME 15 mL 3  . Insulin Pen Needle (PEN NEEDLES) 32G X 4 MM MISC 1 each by Does not apply route at bedtime. Use to inject Lantus each night. 90 each 1  . losartan (COZAAR) 25 MG tablet Take 0.5 tablets (12.5 mg total) by mouth daily. 15 tablet 11  . nitroGLYCERIN (NITROSTAT) 0.4 MG SL tablet Place 0.4 mg under the tongue every 5 (five) minutes as needed for chest pain (up to 3 doses).     Marland Kitchen rOPINIRole (REQUIP) 0.5 MG tablet TAKE 2 TABLETS BY MOUTH AT  BEDTIME (Patient taking differently: Take 1 mg by mouth at bedtime. ) 180 tablet 0  . torsemide (DEMADEX) 20 MG tablet Take 3 tablets (60 mg total) by mouth every morning AND 2 tablets (40 mg total) every evening. Please cancel all previous orders for current medication. Change in dosage or pill size.. 150 tablet 3  . vitamin B-12 (CYANOCOBALAMIN) 1000 MCG tablet Take 1 tablet (1,000 mcg total) by mouth daily. 90 tablet 0  . amoxicillin (AMOXIL) 500 MG capsule Take 1 capsule (500 mg total) by mouth 4 (four) times daily. 120 capsule 1   No facility-administered medications prior to visit.       Allergies  Allergen Reactions  . Benazepril Other (See Comments)    Unknown reaction at age 19-65 - possibly dizziness  . Angiotensin Receptor Blockers Other (See Comments)    Hypotension reaction  . Fe-Succ-C-Thre-B12-Des Stomach Other (See Comments)    Unknown reaction  . Sulfamethoxazole-Trimethoprim Other (See Comments)    Unknown reaction    Social History   Tobacco Use  . Smoking status: Never Smoker  . Smokeless tobacco: Never Used  Substance Use Topics  . Alcohol use: No  . Drug use: No    Objective:   Vitals:   11/28/18 0944  BP: (!) 150/79  Pulse: 69  Temp: (!) 97.5 F (36.4 C)  TempSrc: Oral  Weight: 230 lb 12.8 oz (104.7 kg)   Body mass index is 37.25 kg/m.  Physical Exam Vitals signs reviewed.  Constitutional:      General: She is not in acute distress.    Appearance: She is well-developed.     Comments: Seated comfortably in chair. Appears well today. Son accompanies her.   HENT:     Mouth/Throat:     Mouth: Mucous membranes are moist. No oral lesions.     Dentition: Normal dentition. No dental abscesses.     Pharynx: No oropharyngeal exudate.  Cardiovascular:     Rate and Rhythm: Normal rate and regular rhythm.  Pulmonary:     Effort: Pulmonary effort is normal.     Breath sounds: Normal breath sounds.  Abdominal:     General: There is no distension.     Palpations: Abdomen is soft.     Tenderness: There is no abdominal tenderness.  Musculoskeletal:        General: Tenderness: No tenderness over thoracolumbar spine down to sacrum with moderate pressure. Twinge of pain with compression over L SI joint.     Comments: Can raise bilateral hips to gravity, difficult against resistance. Very slow moving from sitting to standing. Pain with attempt to hyperextend spine but fearful d/t balance.   Lymphadenopathy:     Cervical: No cervical adenopathy.  Skin:    General: Skin is warm and dry.     Findings: Wound (Stage 2 pressure injury with  linear full thickness abrasion occupying gluteal cleft. no dranage. )  present.  Neurological:     Mental Status: She is alert and oriented to person, place, and time.     Lab Results: Lab Results  Component Value Date   WBC 6.0 11/28/2018   HGB 10.1 (L) 11/28/2018   HCT 30.8 (L) 11/28/2018   MCV 96.0 11/28/2018   PLT 145 11/28/2018    Lab Results  Component Value Date   CREATININE 1.44 (H) 10/14/2018   BUN 36 (H) 10/14/2018   NA 136 10/14/2018   K 3.7 10/14/2018   CL 98 10/14/2018   CO2 28 10/14/2018    Lab Results  Component Value Date   ALT 14 03/25/2017   AST 27 03/25/2017   ALKPHOS 99 03/25/2017   BILITOT 0.8 03/25/2017    Sed Rate  Date Value  11/19/2018 50 mm/h (H)  10/06/2018 80 mm/hr (H)   CRP  Date Value  11/19/2018 11.1 mg/L (H)  10/07/2018 10.2 mg/dL (H)    IMAGING: 10/06/18 IMPRESSION: 1. Abnormal L2-L3 disc, vertebrae, and surrounding soft tissues most compatible with Acute Discitis Osteomyelitis. Confluent edema in both vertebrae and the surrounding paraspinal soft tissues greater on the left. No drainable fluid collection or abscess at this time, although attention directed to the left paraspinal phlegmon on follow-up imaging. Underlying multifactorial moderate to severe spinal, left lateral recess, and left foraminal stenosis.  2. Chronic DISH related ankylosis from the lower thoracic spine to the L2 level. L3 through S1 spinal degeneration with moderate to severe spinal stenosis at L3-L4, moderate to severe right side lateral recess and foraminal stenosis at L4-L5.   Assessment & Plan:   Problem List Items Addressed This Visit      High   Vertebral osteomyelitis ([place]) - Primary    L2-3 discitis/osteomyelitis due to actinomyces species. She was improving on IV therapy and now with worsened pain after transitioning to oral therapy and returning home from SNF. Exam unremarkable and no tenderness over spine. No neurologic deficits on exam  concerning for spinal abscess. I suspect that she is having more pain due to the fact that she is moving around more and trying to return to normal function. Will change her to doxycycline for easier dosing and better bone penetration to see if this helps. Will also check inflammatory markers to help with determination if we need to consider repeated imaging.   For her pain plan will see if she can double current Tylenol dose for better effect. She is very high fall risk and I worry about anything stronger for her. I also am concerned the recliner is hurting her more than helping her.   She is asking for a different walker - will reach out to her PCP for some help as I am not certain what particular piece of equipment she is referencing. It may be that she can have PT make formal recommendation at her home assessment next week. Her walker is a bit low for her which may be part of the problem.   Will have her return in 4 weeks.       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 Needs 6-12 months        Unprioritized   Decubitus ulcer of coccyx, stage 2 ([place])    Discussed measures to off-load. I gave her some alevyn foam dressings today and asked her to look  into ordering them for home use to help treat this as well.         Janene Madeira, MSN, NP-C [facility] [place] Pager: [phone] Office: [phone]  11/28/18  4:35 PM

## 2018-11-28 NOTE — Assessment & Plan Note (Addendum)
L2-3 discitis/osteomyelitis due to actinomyces species. She was improving on IV therapy and now with worsened pain after transitioning to oral therapy and returning home from SNF. Exam unremarkable and no tenderness over spine. No neurologic deficits on exam concerning for spinal abscess. I suspect that she is having more pain due to the fact that she is moving around more and trying to return to normal function. Will change her to doxycycline for easier dosing and better bone penetration to see if this helps. Will also check inflammatory markers to help with determination if we need to consider repeated imaging.   For her pain plan will see if she can double current Tylenol dose for better effect. She is very high fall risk and I worry about anything stronger for her. I also am concerned the recliner is hurting her more than helping her.   She is asking for a different walker - will reach out to her PCP for some help as I am not certain what particular piece of equipment she is referencing. It may be that she can have PT make formal recommendation at her home assessment next week. Her walker is a bit low for her which may be part of the problem.   Will have her return in 4 weeks.

## 2018-11-28 NOTE — Assessment & Plan Note (Signed)
A1c is well controlled at 5.9 today.  Discussed with patient that we may be able to decrease her Lantus and allow her A1c to be a little higher due to her age, but we will wait until she is done with her treatment for discitis before making any changes, since blood sugar control is likely helpful in the acute setting of her discitis.  Patient is agreeable to this plan.

## 2018-11-28 NOTE — Progress Notes (Signed)
Subjective:    [name] - 78 y.o. female MRN [id number]  Date of birth: 12-21-1940  CC:  [name] is here for follow up of vertebral discitis, CKD, and diabetes.  HPI: Vertebral discitis - back pain has worsened since she has been transitioned from IV penicillin to amoxicillin - is not bending, lifting, or twisting, but if she attempts to do any of these movements, her pain will increase in severity - is taking Tylenol for pain, but doesn't want to take any oxycodone because she is concerned  - is taking prunes and a stool softener, which is working - had an episode of numbness below her left knee  CKD -Recently visited her nephrologist and kidney disease is stable - torsemide dose was decreased by her nephrologist, although she doesn't take it sometimes because she can't get to the bathroom fast enough - takes torsemide 40 mg BID  T2DM -Takes Lantus 17 units nightly - no hypoglycemic symptoms - No big changes in diet or exercise, although she is eating a little less and been less active due to her recent hospital stay  Health Maintenance:  Health Maintenance Due  Topic Date Due  . TETANUS/TDAP  03/26/1958    -  reports that she has never smoked. She has never used smokeless tobacco. - Review of Systems: Per HPI. - Past Medical History: Patient Active Problem List   Diagnosis Date Noted  . 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 CHF (congestive heart failure) ([place])   . Discitis   . Vertebral osteomyelitis ([place]) 10/06/2018  . Foot deformity 08/05/2018  . Intertrigo 08/05/2018  . Iron deficiency anemia 05/19/2018  . Cloudy urine 04/03/2018  . Dysuria 11/26/2017  . OSA on CPAP   . Hypokalemia 11/20/2017  . Longstanding persistent atrial fibrillation   . Ischemic cardiomyopathy   . Hypertension   . Hemiparesis ([place])   . Facial weakness, post-stroke   . Chronic systolic CHF (congestive heart failure) ([place])   . Chronic lower back pain     . Arthritis   . Venous stasis 09/13/2017  . Morbid obesity with BMI of 40.0-44.9, adult ([place]) 08/14/2017  . Bilateral leg edema 07/18/2017  . Blood loss anemia 04/09/2017  . History of CVA with residual deficit 03/26/2017  . Pulmonary hypertension ([place]) 03/26/2017  . Morbid obesity ([place]) 03/26/2017  . Gastrointestinal hemorrhage 03/26/2017  . Rectal bleeding   . Chronic pain syndrome   . Coronary artery disease involving coronary bypass graft of native heart without angina pectoris   . Benign essential HTN   . DM type 2 with diabetic peripheral neuropathy ([place])   . Thrombocytopenia ([place])   . Cerebrovascular accident (CVA) due to embolism of right middle cerebral artery ([place])   . CKD (chronic kidney disease) stage 3, GFR 30-59 ml/min (HCC) 03/30/2016  . Chronic anticoagulation 03/30/2016  . Hyperlipidemia 01/31/2009  . ACC/AHA stage C congestive heart failure due to ischemic cardiomyopathy ([place]) 01/31/2009  . Chronic systolic heart failure ([place]) 01/31/2009  . Atrial fibrillation ([place]) 01/31/2009  . CAD (coronary artery disease) 05/12/2008   - Medications: reviewed and updated   Objective:   Physical Exam BP (!) 128/56   Pulse 66   Temp 98 F (36.7 C) (Oral)   Wt 230 lb (104.3 kg)   LMP  (LMP Unknown)   SpO2 99%   BMI 37.12 kg/m  Gen: NAD, alert, cooperative with exam, appears tired, pleasant CV: RRR, good S1/S2, no murmur, stable 2+ pedal  edema  Resp: CTABL, no wheezes, non-labored Musculoskeletal: Significantly tender to palpation along spine, especially on the lower back Neuro: no gross deficits.  Psych: good insight, alert and oriented        Assessment & Plan:   Discitis I am concerned that her current antibiotic regimen may not be sufficient in treating her discitis.  Advised patient to call her infectious disease clinic to get their opinion on this and see if she needs to switch to another antibiotic.  They plan to call them after our appointment.  We will  also allow infectious disease to order inflammatory markers, since I do not want to disrupt their schedule in trending these.  DM type 2 with diabetic peripheral neuropathy (HCC) A1c is well controlled at 5.9 today.  Discussed with patient that we may be able to decrease her Lantus and allow her A1c to be a little higher due to her age, but we will wait until she is done with her treatment for discitis before making any changes, since blood sugar control is likely helpful in the acute setting of her discitis.  Patient is agreeable to this plan.    Maia Breslow, M.D. 11/28/2018, 11:17 AM PGY-2, [place]

## 2018-11-29 LAB — CBC
HCT: 30.8 % — ABNORMAL LOW (ref 35.0–45.0)
Hemoglobin: 10.1 g/dL — ABNORMAL LOW (ref 11.7–15.5)
MCH: 31.5 pg (ref 27.0–33.0)
MCHC: 32.8 g/dL (ref 32.0–36.0)
MCV: 96 fL (ref 80.0–100.0)
MPV: 9.8 fL (ref 7.5–12.5)
Platelets: 145 10*3/uL (ref 140–400)
RBC: 3.21 10*6/uL — ABNORMAL LOW (ref 3.80–5.10)
RDW: 12.1 % (ref 11.0–15.0)
WBC: 6 10*3/uL (ref 3.8–10.8)

## 2018-11-29 LAB — SEDIMENTATION RATE: Sed Rate: 38 mm/h — ABNORMAL HIGH (ref 0–30)

## 2018-11-29 LAB — C-REACTIVE PROTEIN: CRP: 8.3 mg/L — ABNORMAL HIGH (ref <8.0)

## 2018-12-01 ENCOUNTER — Telehealth: Payer: Self-pay | Admitting: Infectious Diseases

## 2018-12-01 ENCOUNTER — Telehealth: Payer: Self-pay | Admitting: Family Medicine

## 2018-12-01 DIAGNOSIS — Z8673 Personal history of transient ischemic attack (TIA), and cerebral infarction without residual deficits: Secondary | ICD-10-CM | POA: Diagnosis not present

## 2018-12-01 DIAGNOSIS — N183 Chronic kidney disease, stage 3 (moderate): Secondary | ICD-10-CM | POA: Diagnosis not present

## 2018-12-01 DIAGNOSIS — G4733 Obstructive sleep apnea (adult) (pediatric): Secondary | ICD-10-CM | POA: Diagnosis not present

## 2018-12-01 DIAGNOSIS — I4891 Unspecified atrial fibrillation: Secondary | ICD-10-CM | POA: Diagnosis not present

## 2018-12-01 DIAGNOSIS — M4626 Osteomyelitis of vertebra, lumbar region: Secondary | ICD-10-CM | POA: Diagnosis not present

## 2018-12-01 DIAGNOSIS — I872 Venous insufficiency (chronic) (peripheral): Secondary | ICD-10-CM | POA: Diagnosis not present

## 2018-12-01 DIAGNOSIS — I25810 Atherosclerosis of coronary artery bypass graft(s) without angina pectoris: Secondary | ICD-10-CM | POA: Diagnosis not present

## 2018-12-01 DIAGNOSIS — Z7901 Long term (current) use of anticoagulants: Secondary | ICD-10-CM | POA: Diagnosis not present

## 2018-12-01 DIAGNOSIS — L89312 Pressure ulcer of right buttock, stage 2: Secondary | ICD-10-CM | POA: Diagnosis not present

## 2018-12-01 DIAGNOSIS — G8929 Other chronic pain: Secondary | ICD-10-CM | POA: Diagnosis not present

## 2018-12-01 DIAGNOSIS — I130 Hypertensive heart and chronic kidney disease with heart failure and stage 1 through stage 4 chronic kidney disease, or unspecified chronic kidney disease: Secondary | ICD-10-CM | POA: Diagnosis not present

## 2018-12-01 DIAGNOSIS — E785 Hyperlipidemia, unspecified: Secondary | ICD-10-CM | POA: Diagnosis not present

## 2018-12-01 DIAGNOSIS — Z794 Long term (current) use of insulin: Secondary | ICD-10-CM | POA: Diagnosis not present

## 2018-12-01 DIAGNOSIS — M25552 Pain in left hip: Secondary | ICD-10-CM | POA: Diagnosis not present

## 2018-12-01 DIAGNOSIS — I5022 Chronic systolic (congestive) heart failure: Secondary | ICD-10-CM | POA: Diagnosis not present

## 2018-12-01 DIAGNOSIS — A4289 Other forms of actinomycosis: Secondary | ICD-10-CM | POA: Diagnosis not present

## 2018-12-01 DIAGNOSIS — E1122 Type 2 diabetes mellitus with diabetic chronic kidney disease: Secondary | ICD-10-CM | POA: Diagnosis not present

## 2018-12-01 DIAGNOSIS — E1151 Type 2 diabetes mellitus with diabetic peripheral angiopathy without gangrene: Secondary | ICD-10-CM | POA: Diagnosis not present

## 2018-12-01 DIAGNOSIS — M4646 Discitis, unspecified, lumbar region: Secondary | ICD-10-CM | POA: Diagnosis not present

## 2018-12-01 NOTE — Telephone Encounter (Signed)
I called to check in on [name] - she had a rough weekend but is starting to feel better today. She tolerated physical therapy and was able to do all her exercises standing up. Increasing her tylenol has helped. She has tolerated her new antibiotic well w/o nausea.   I reassured her that her antibiotics seem to be working well to treat her infection based on her recent lab work. She was pleased to hear this. Will continue her doxycycline and reassess in 1 month with repeat labs and exam.    Sed Rate  Date Value  11/28/2018 38 mm/h (H)  11/19/2018 50 mm/h (H)  10/06/2018 80 mm/hr (H)   CRP  Date Value  11/28/2018 8.3 mg/L (H)  11/19/2018 11.1 mg/L (H)  10/07/2018 10.2 mg/dL (H)

## 2018-12-01 NOTE — Telephone Encounter (Signed)
Pt's nurse called and she is requesting for the Pt's wound care order for her behind. Please call her at [phone]. ad

## 2018-12-01 NOTE — Progress Notes (Signed)
Markers improved. Called to d/w patient - see phone note.

## 2018-12-02 DIAGNOSIS — M4646 Discitis, unspecified, lumbar region: Secondary | ICD-10-CM | POA: Diagnosis not present

## 2018-12-03 DIAGNOSIS — I5022 Chronic systolic (congestive) heart failure: Secondary | ICD-10-CM | POA: Diagnosis not present

## 2018-12-03 DIAGNOSIS — Z794 Long term (current) use of insulin: Secondary | ICD-10-CM | POA: Diagnosis not present

## 2018-12-03 DIAGNOSIS — G4733 Obstructive sleep apnea (adult) (pediatric): Secondary | ICD-10-CM | POA: Diagnosis not present

## 2018-12-03 DIAGNOSIS — A4289 Other forms of actinomycosis: Secondary | ICD-10-CM | POA: Diagnosis not present

## 2018-12-03 DIAGNOSIS — M4646 Discitis, unspecified, lumbar region: Secondary | ICD-10-CM | POA: Diagnosis not present

## 2018-12-03 DIAGNOSIS — I130 Hypertensive heart and chronic kidney disease with heart failure and stage 1 through stage 4 chronic kidney disease, or unspecified chronic kidney disease: Secondary | ICD-10-CM | POA: Diagnosis not present

## 2018-12-03 DIAGNOSIS — M25552 Pain in left hip: Secondary | ICD-10-CM | POA: Diagnosis not present

## 2018-12-03 DIAGNOSIS — E785 Hyperlipidemia, unspecified: Secondary | ICD-10-CM | POA: Diagnosis not present

## 2018-12-03 DIAGNOSIS — G8929 Other chronic pain: Secondary | ICD-10-CM | POA: Diagnosis not present

## 2018-12-03 DIAGNOSIS — M4626 Osteomyelitis of vertebra, lumbar region: Secondary | ICD-10-CM | POA: Diagnosis not present

## 2018-12-03 DIAGNOSIS — I872 Venous insufficiency (chronic) (peripheral): Secondary | ICD-10-CM | POA: Diagnosis not present

## 2018-12-03 DIAGNOSIS — I4891 Unspecified atrial fibrillation: Secondary | ICD-10-CM | POA: Diagnosis not present

## 2018-12-03 DIAGNOSIS — L89312 Pressure ulcer of right buttock, stage 2: Secondary | ICD-10-CM | POA: Diagnosis not present

## 2018-12-03 DIAGNOSIS — N183 Chronic kidney disease, stage 3 (moderate): Secondary | ICD-10-CM | POA: Diagnosis not present

## 2018-12-03 DIAGNOSIS — I25810 Atherosclerosis of coronary artery bypass graft(s) without angina pectoris: Secondary | ICD-10-CM | POA: Diagnosis not present

## 2018-12-03 DIAGNOSIS — N340 Urethral abscess: Secondary | ICD-10-CM | POA: Diagnosis not present

## 2018-12-03 DIAGNOSIS — E1122 Type 2 diabetes mellitus with diabetic chronic kidney disease: Secondary | ICD-10-CM | POA: Diagnosis not present

## 2018-12-03 DIAGNOSIS — Z7901 Long term (current) use of anticoagulants: Secondary | ICD-10-CM | POA: Diagnosis not present

## 2018-12-03 DIAGNOSIS — Z8673 Personal history of transient ischemic attack (TIA), and cerebral infarction without residual deficits: Secondary | ICD-10-CM | POA: Diagnosis not present

## 2018-12-03 DIAGNOSIS — E1151 Type 2 diabetes mellitus with diabetic peripheral angiopathy without gangrene: Secondary | ICD-10-CM | POA: Diagnosis not present

## 2018-12-03 NOTE — Telephone Encounter (Signed)
Tempted to call patient's nurse to give her orders about wound care, but got voicemail.  Was unsure if I could leave a message since I did not know if it was a confidential voicemail.  I will try again tomorrow.

## 2018-12-04 NOTE — Telephone Encounter (Signed)
Spoke with Santiago Glad, patient's nurse, and gave verbal orders for wound care.  Thanks!

## 2018-12-05 DIAGNOSIS — L89312 Pressure ulcer of right buttock, stage 2: Secondary | ICD-10-CM | POA: Diagnosis not present

## 2018-12-05 DIAGNOSIS — E1122 Type 2 diabetes mellitus with diabetic chronic kidney disease: Secondary | ICD-10-CM | POA: Diagnosis not present

## 2018-12-05 DIAGNOSIS — M4646 Discitis, unspecified, lumbar region: Secondary | ICD-10-CM | POA: Diagnosis not present

## 2018-12-05 DIAGNOSIS — I130 Hypertensive heart and chronic kidney disease with heart failure and stage 1 through stage 4 chronic kidney disease, or unspecified chronic kidney disease: Secondary | ICD-10-CM | POA: Diagnosis not present

## 2018-12-05 DIAGNOSIS — A4289 Other forms of actinomycosis: Secondary | ICD-10-CM | POA: Diagnosis not present

## 2018-12-05 DIAGNOSIS — G8929 Other chronic pain: Secondary | ICD-10-CM | POA: Diagnosis not present

## 2018-12-05 DIAGNOSIS — M4626 Osteomyelitis of vertebra, lumbar region: Secondary | ICD-10-CM | POA: Diagnosis not present

## 2018-12-05 DIAGNOSIS — I4891 Unspecified atrial fibrillation: Secondary | ICD-10-CM | POA: Diagnosis not present

## 2018-12-05 DIAGNOSIS — Z8673 Personal history of transient ischemic attack (TIA), and cerebral infarction without residual deficits: Secondary | ICD-10-CM | POA: Diagnosis not present

## 2018-12-05 DIAGNOSIS — G4733 Obstructive sleep apnea (adult) (pediatric): Secondary | ICD-10-CM | POA: Diagnosis not present

## 2018-12-05 DIAGNOSIS — I872 Venous insufficiency (chronic) (peripheral): Secondary | ICD-10-CM | POA: Diagnosis not present

## 2018-12-05 DIAGNOSIS — I25810 Atherosclerosis of coronary artery bypass graft(s) without angina pectoris: Secondary | ICD-10-CM | POA: Diagnosis not present

## 2018-12-05 DIAGNOSIS — M25552 Pain in left hip: Secondary | ICD-10-CM | POA: Diagnosis not present

## 2018-12-05 DIAGNOSIS — Z7901 Long term (current) use of anticoagulants: Secondary | ICD-10-CM | POA: Diagnosis not present

## 2018-12-05 DIAGNOSIS — Z794 Long term (current) use of insulin: Secondary | ICD-10-CM | POA: Diagnosis not present

## 2018-12-05 DIAGNOSIS — N183 Chronic kidney disease, stage 3 (moderate): Secondary | ICD-10-CM | POA: Diagnosis not present

## 2018-12-05 DIAGNOSIS — E785 Hyperlipidemia, unspecified: Secondary | ICD-10-CM | POA: Diagnosis not present

## 2018-12-05 DIAGNOSIS — I5022 Chronic systolic (congestive) heart failure: Secondary | ICD-10-CM | POA: Diagnosis not present

## 2018-12-05 DIAGNOSIS — E1151 Type 2 diabetes mellitus with diabetic peripheral angiopathy without gangrene: Secondary | ICD-10-CM | POA: Diagnosis not present

## 2018-12-09 ENCOUNTER — Telehealth: Payer: Self-pay

## 2018-12-09 DIAGNOSIS — Z8673 Personal history of transient ischemic attack (TIA), and cerebral infarction without residual deficits: Secondary | ICD-10-CM | POA: Diagnosis not present

## 2018-12-09 DIAGNOSIS — Z794 Long term (current) use of insulin: Secondary | ICD-10-CM | POA: Diagnosis not present

## 2018-12-09 DIAGNOSIS — M4646 Discitis, unspecified, lumbar region: Secondary | ICD-10-CM | POA: Diagnosis not present

## 2018-12-09 DIAGNOSIS — M25552 Pain in left hip: Secondary | ICD-10-CM | POA: Diagnosis not present

## 2018-12-09 DIAGNOSIS — I872 Venous insufficiency (chronic) (peripheral): Secondary | ICD-10-CM | POA: Diagnosis not present

## 2018-12-09 DIAGNOSIS — E785 Hyperlipidemia, unspecified: Secondary | ICD-10-CM | POA: Diagnosis not present

## 2018-12-09 DIAGNOSIS — M4626 Osteomyelitis of vertebra, lumbar region: Secondary | ICD-10-CM | POA: Diagnosis not present

## 2018-12-09 DIAGNOSIS — I4891 Unspecified atrial fibrillation: Secondary | ICD-10-CM | POA: Diagnosis not present

## 2018-12-09 DIAGNOSIS — E1122 Type 2 diabetes mellitus with diabetic chronic kidney disease: Secondary | ICD-10-CM | POA: Diagnosis not present

## 2018-12-09 DIAGNOSIS — E1151 Type 2 diabetes mellitus with diabetic peripheral angiopathy without gangrene: Secondary | ICD-10-CM | POA: Diagnosis not present

## 2018-12-09 DIAGNOSIS — G4733 Obstructive sleep apnea (adult) (pediatric): Secondary | ICD-10-CM | POA: Diagnosis not present

## 2018-12-09 DIAGNOSIS — I25810 Atherosclerosis of coronary artery bypass graft(s) without angina pectoris: Secondary | ICD-10-CM | POA: Diagnosis not present

## 2018-12-09 DIAGNOSIS — Z7901 Long term (current) use of anticoagulants: Secondary | ICD-10-CM | POA: Diagnosis not present

## 2018-12-09 DIAGNOSIS — A4289 Other forms of actinomycosis: Secondary | ICD-10-CM | POA: Diagnosis not present

## 2018-12-09 DIAGNOSIS — G8929 Other chronic pain: Secondary | ICD-10-CM | POA: Diagnosis not present

## 2018-12-09 DIAGNOSIS — N183 Chronic kidney disease, stage 3 (moderate): Secondary | ICD-10-CM | POA: Diagnosis not present

## 2018-12-09 DIAGNOSIS — I130 Hypertensive heart and chronic kidney disease with heart failure and stage 1 through stage 4 chronic kidney disease, or unspecified chronic kidney disease: Secondary | ICD-10-CM | POA: Diagnosis not present

## 2018-12-09 DIAGNOSIS — I5022 Chronic systolic (congestive) heart failure: Secondary | ICD-10-CM | POA: Diagnosis not present

## 2018-12-09 DIAGNOSIS — L89312 Pressure ulcer of right buttock, stage 2: Secondary | ICD-10-CM | POA: Diagnosis not present

## 2018-12-09 NOTE — Telephone Encounter (Signed)
Santiago Glad from [facility] calling for verbal orders for a [facility] aide for patient.  Call back is [phone]. Ok to leave message.  Danley Danker, RN [facility] RN)

## 2018-12-10 DIAGNOSIS — M25552 Pain in left hip: Secondary | ICD-10-CM | POA: Diagnosis not present

## 2018-12-10 DIAGNOSIS — I872 Venous insufficiency (chronic) (peripheral): Secondary | ICD-10-CM | POA: Diagnosis not present

## 2018-12-10 DIAGNOSIS — I25810 Atherosclerosis of coronary artery bypass graft(s) without angina pectoris: Secondary | ICD-10-CM | POA: Diagnosis not present

## 2018-12-10 DIAGNOSIS — Z794 Long term (current) use of insulin: Secondary | ICD-10-CM | POA: Diagnosis not present

## 2018-12-10 DIAGNOSIS — L89312 Pressure ulcer of right buttock, stage 2: Secondary | ICD-10-CM | POA: Diagnosis not present

## 2018-12-10 DIAGNOSIS — G8929 Other chronic pain: Secondary | ICD-10-CM | POA: Diagnosis not present

## 2018-12-10 DIAGNOSIS — M4646 Discitis, unspecified, lumbar region: Secondary | ICD-10-CM | POA: Diagnosis not present

## 2018-12-10 DIAGNOSIS — E1151 Type 2 diabetes mellitus with diabetic peripheral angiopathy without gangrene: Secondary | ICD-10-CM | POA: Diagnosis not present

## 2018-12-10 DIAGNOSIS — G4733 Obstructive sleep apnea (adult) (pediatric): Secondary | ICD-10-CM | POA: Diagnosis not present

## 2018-12-10 DIAGNOSIS — I4891 Unspecified atrial fibrillation: Secondary | ICD-10-CM | POA: Diagnosis not present

## 2018-12-10 DIAGNOSIS — I130 Hypertensive heart and chronic kidney disease with heart failure and stage 1 through stage 4 chronic kidney disease, or unspecified chronic kidney disease: Secondary | ICD-10-CM | POA: Diagnosis not present

## 2018-12-10 DIAGNOSIS — Z7901 Long term (current) use of anticoagulants: Secondary | ICD-10-CM | POA: Diagnosis not present

## 2018-12-10 DIAGNOSIS — N183 Chronic kidney disease, stage 3 (moderate): Secondary | ICD-10-CM | POA: Diagnosis not present

## 2018-12-10 DIAGNOSIS — E785 Hyperlipidemia, unspecified: Secondary | ICD-10-CM | POA: Diagnosis not present

## 2018-12-10 DIAGNOSIS — M4626 Osteomyelitis of vertebra, lumbar region: Secondary | ICD-10-CM | POA: Diagnosis not present

## 2018-12-10 DIAGNOSIS — E1122 Type 2 diabetes mellitus with diabetic chronic kidney disease: Secondary | ICD-10-CM | POA: Diagnosis not present

## 2018-12-10 DIAGNOSIS — Z8673 Personal history of transient ischemic attack (TIA), and cerebral infarction without residual deficits: Secondary | ICD-10-CM | POA: Diagnosis not present

## 2018-12-10 DIAGNOSIS — A4289 Other forms of actinomycosis: Secondary | ICD-10-CM | POA: Diagnosis not present

## 2018-12-10 DIAGNOSIS — I5022 Chronic systolic (congestive) heart failure: Secondary | ICD-10-CM | POA: Diagnosis not present

## 2018-12-10 NOTE — Telephone Encounter (Signed)
Verbal orders have been placed.  Thanks!

## 2018-12-11 ENCOUNTER — Telehealth: Payer: Self-pay

## 2018-12-11 DIAGNOSIS — N183 Chronic kidney disease, stage 3 (moderate): Secondary | ICD-10-CM | POA: Diagnosis not present

## 2018-12-11 DIAGNOSIS — Z7901 Long term (current) use of anticoagulants: Secondary | ICD-10-CM | POA: Diagnosis not present

## 2018-12-11 DIAGNOSIS — G8929 Other chronic pain: Secondary | ICD-10-CM | POA: Diagnosis not present

## 2018-12-11 DIAGNOSIS — A4289 Other forms of actinomycosis: Secondary | ICD-10-CM | POA: Diagnosis not present

## 2018-12-11 DIAGNOSIS — Z8673 Personal history of transient ischemic attack (TIA), and cerebral infarction without residual deficits: Secondary | ICD-10-CM | POA: Diagnosis not present

## 2018-12-11 DIAGNOSIS — I25810 Atherosclerosis of coronary artery bypass graft(s) without angina pectoris: Secondary | ICD-10-CM | POA: Diagnosis not present

## 2018-12-11 DIAGNOSIS — L89312 Pressure ulcer of right buttock, stage 2: Secondary | ICD-10-CM | POA: Diagnosis not present

## 2018-12-11 DIAGNOSIS — G4733 Obstructive sleep apnea (adult) (pediatric): Secondary | ICD-10-CM | POA: Diagnosis not present

## 2018-12-11 DIAGNOSIS — M25552 Pain in left hip: Secondary | ICD-10-CM | POA: Diagnosis not present

## 2018-12-11 DIAGNOSIS — E1122 Type 2 diabetes mellitus with diabetic chronic kidney disease: Secondary | ICD-10-CM | POA: Diagnosis not present

## 2018-12-11 DIAGNOSIS — M4626 Osteomyelitis of vertebra, lumbar region: Secondary | ICD-10-CM | POA: Diagnosis not present

## 2018-12-11 DIAGNOSIS — I4891 Unspecified atrial fibrillation: Secondary | ICD-10-CM | POA: Diagnosis not present

## 2018-12-11 DIAGNOSIS — M4646 Discitis, unspecified, lumbar region: Secondary | ICD-10-CM | POA: Diagnosis not present

## 2018-12-11 DIAGNOSIS — E785 Hyperlipidemia, unspecified: Secondary | ICD-10-CM | POA: Diagnosis not present

## 2018-12-11 DIAGNOSIS — I872 Venous insufficiency (chronic) (peripheral): Secondary | ICD-10-CM | POA: Diagnosis not present

## 2018-12-11 DIAGNOSIS — I130 Hypertensive heart and chronic kidney disease with heart failure and stage 1 through stage 4 chronic kidney disease, or unspecified chronic kidney disease: Secondary | ICD-10-CM | POA: Diagnosis not present

## 2018-12-11 DIAGNOSIS — L89152 Pressure ulcer of sacral region, stage 2: Secondary | ICD-10-CM

## 2018-12-11 DIAGNOSIS — I5022 Chronic systolic (congestive) heart failure: Secondary | ICD-10-CM | POA: Diagnosis not present

## 2018-12-11 DIAGNOSIS — E1151 Type 2 diabetes mellitus with diabetic peripheral angiopathy without gangrene: Secondary | ICD-10-CM | POA: Diagnosis not present

## 2018-12-11 DIAGNOSIS — Z794 Long term (current) use of insulin: Secondary | ICD-10-CM | POA: Diagnosis not present

## 2018-12-11 NOTE — Telephone Encounter (Signed)
Patient is having pain on her bottom with  burning  And some bleeding present.  She states the pain has worsened since discharge and is using a foam pad.  Her nursing services will be ending soon with [facility].   Patient states she will be out of ointment soon and requested a refill.  I did not see an ointment listed on her medication list and she was not able to give me the name.

## 2018-12-11 NOTE — Telephone Encounter (Signed)
I am not certain what ointment she is referring to. I suspect she was given some barrier cream from either the hospital or SNF at discharge. She gets a little mixed up as to who is treating what.  She had a stage 2 present that was pretty long extending through gluteal cleft that [place] was treating with Allevyn foam dressings; however consider her poor mobility I am concerned her wound has worsened and it sounds like it has considering new bleeding to the site. I will enter a referral to wound care clinic to have them see her. I will route to her PCP also to see if she has any suggestions about topical creams that may help. I could not find where I can order Restore Barrier cream.   Thank you, Tammy.

## 2018-12-12 ENCOUNTER — Other Ambulatory Visit: Payer: Self-pay | Admitting: Family Medicine

## 2018-12-12 DIAGNOSIS — M4646 Discitis, unspecified, lumbar region: Secondary | ICD-10-CM | POA: Diagnosis not present

## 2018-12-12 MED ORDER — ZINC OXIDE 40 % EX PSTE: 1.0000 "application " | PASTE | Freq: Three times a day (TID) | CUTANEOUS | 2 refills | Status: DC

## 2018-12-12 NOTE — Progress Notes (Signed)
Ordering more barrier cream for [name]

## 2018-12-12 NOTE — Telephone Encounter (Signed)
Thank you for taking care of [name] and letting me know about this issue.  I just spoke with [name], and she says that her mobility is improving and she is trying to keep as much pressure off of her sacral area as possible.  She also needed more of the zinc oxide ointment called in to her pharmacy, which was done.  I am hopeful that with increased mobility, her ulcer will slowly improve, but I encouraged her to call our clinic and make an appointment if she gets worried about her ulcer worsening.  She also has a new home health nurse coming to see her later this afternoon, and I alerted that nurse to let us know if she has any concerns when she sees her.

## 2018-12-12 NOTE — Telephone Encounter (Signed)
How wonderful, thank you Dr. Shan Levans. We so appreciate your care. I am glad to hear that her mobility is continuing to improve - I worry about her at home.   Very glad to hear she has a new home health nurse.

## 2018-12-16 ENCOUNTER — Ambulatory Visit (HOSPITAL_COMMUNITY)
Admission: RE | Admit: 2018-12-16 | Discharge: 2018-12-16 | Disposition: A | Discharge status: Home / Self Care | Payer: Medicare Other | Source: Ambulatory Visit | Attending: Cardiology | Admitting: Cardiology

## 2018-12-16 ENCOUNTER — Encounter (HOSPITAL_COMMUNITY): Payer: Self-pay

## 2018-12-16 ENCOUNTER — Ambulatory Visit: Payer: Medicare Other | Admitting: Cardiology

## 2018-12-16 VITALS — BP 144/72 | HR 66 | Wt 217.4 lb

## 2018-12-16 DIAGNOSIS — N183 Chronic kidney disease, stage 3 unspecified: Secondary | ICD-10-CM

## 2018-12-16 DIAGNOSIS — I2510 Atherosclerotic heart disease of native coronary artery without angina pectoris: Secondary | ICD-10-CM | POA: Insufficient documentation

## 2018-12-16 DIAGNOSIS — Z951 Presence of aortocoronary bypass graft: Secondary | ICD-10-CM | POA: Insufficient documentation

## 2018-12-16 DIAGNOSIS — I255 Ischemic cardiomyopathy: Secondary | ICD-10-CM | POA: Diagnosis not present

## 2018-12-16 DIAGNOSIS — I272 Pulmonary hypertension, unspecified: Secondary | ICD-10-CM | POA: Insufficient documentation

## 2018-12-16 DIAGNOSIS — Z8673 Personal history of transient ischemic attack (TIA), and cerebral infarction without residual deficits: Secondary | ICD-10-CM | POA: Diagnosis not present

## 2018-12-16 DIAGNOSIS — Z794 Long term (current) use of insulin: Secondary | ICD-10-CM | POA: Diagnosis not present

## 2018-12-16 DIAGNOSIS — Z7901 Long term (current) use of anticoagulants: Secondary | ICD-10-CM | POA: Diagnosis not present

## 2018-12-16 DIAGNOSIS — I130 Hypertensive heart and chronic kidney disease with heart failure and stage 1 through stage 4 chronic kidney disease, or unspecified chronic kidney disease: Secondary | ICD-10-CM | POA: Insufficient documentation

## 2018-12-16 DIAGNOSIS — G4733 Obstructive sleep apnea (adult) (pediatric): Secondary | ICD-10-CM | POA: Diagnosis not present

## 2018-12-16 DIAGNOSIS — I482 Chronic atrial fibrillation, unspecified: Secondary | ICD-10-CM | POA: Diagnosis not present

## 2018-12-16 DIAGNOSIS — Z79899 Other long term (current) drug therapy: Secondary | ICD-10-CM | POA: Diagnosis not present

## 2018-12-16 DIAGNOSIS — E1122 Type 2 diabetes mellitus with diabetic chronic kidney disease: Secondary | ICD-10-CM | POA: Insufficient documentation

## 2018-12-16 DIAGNOSIS — I4819 Other persistent atrial fibrillation: Secondary | ICD-10-CM

## 2018-12-16 DIAGNOSIS — Z881 Allergy status to other antibiotic agents status: Secondary | ICD-10-CM | POA: Insufficient documentation

## 2018-12-16 DIAGNOSIS — I5022 Chronic systolic (congestive) heart failure: Secondary | ICD-10-CM | POA: Diagnosis not present

## 2018-12-16 NOTE — Patient Instructions (Signed)
Follow up with Dr.McLean in 3 months.  

## 2018-12-16 NOTE — Progress Notes (Signed)
Advanced Heart Failure Clinic Note   Primary Care: Dr. Shan Levans Primary Cardiologist: Dr. Angelena Form HF Cardiology: Dr. Aundra Dubin Nephrology: Dr Dr Cathe Mons  HPI: [name] is a 78 y.o. female with history of CAD status post CABG 2009, ischemic cardiomyopathy with chronic systolic CHF, persistent atrial fibrillation with stroke in 03/2016 when INR was subtherapeutic, stage III CKD, morbid obesity, pulmonary hypertension, diabetes, GI bleed 03/2017, and severe sleep apnea.   Seen in [facility] clinics 09/10/18 and 09/24/18, both times with volume overload and marked peripheral edema with skin breakdown. Torsemide increased at each visit. Echo in 7/19 showed EF 45-50% with mild RV dilation/mild RV systolic dysfunction and D-shaped interventricular septum.    Admitted 11/25 through 10/14/18. Treated for L2/L3 discitis/osteomyelitis. Discharged to [facility] home and later discharged to home on 11/19/18. She has cut back torsemide to 20 mg daily.   Today she returns for HF follow up. Overall feeling fine. Mild dyspnea with exertion. Denies PND/Orthopnea. Ambulates with a walker. Appetite ok. No fever or chills. Weight at home has been 212-214 pounds. Taking all medications. Has HH once a week. Lives alone but has a lot of support provided by her son.   Labs (12/18): K 3.9, Creatinine 1.65 Labs (5/19): K 4.1, creatinine 1.7 Labs (6/19): hgb 8.2 Labs (8/19): K 4.3, creatinine 1.73 Labs (9/19): K 4, creatinine 1.56 Labs (11/19): K 4.7, creatinine 2.04  Review of systems complete and found to be negative unless listed in HPI.    Past Medical History 1. Chronic systolic CHF: Ischemic cardiomyopathy with prominent RV failure.   - Echo (11/18): LVEF 35-40%, severe RV dilation, severe RAE, severe TR.  - Echo (7/19): EF 45-50%, diffuse hypokinesis, mild RV dilation with mildly decreased RV systolic function, D-shaped interventricular septum suggestive of RV pressure/volume overload, mild MR, moderate TR,  PASP 49 mmhg.  2. CAD: CABG 2009. No angiography since that time.   3. HTN 4. Atrial fibrillation: Chronic since 2013.  5. CKD stage 3 6. Morbid obesity 7. OSA 8. Chronic venous stasis with RLE wound/[place] 9. CVA 5/18.   Current Outpatient Medications  Medication Sig Dispense Refill  . acetaminophen (TYLENOL) 650 MG CR tablet Take 650 mg by mouth every 8 (eight) hours as needed for pain.    Marland Kitchen apixaban (ELIQUIS) 5 MG TABS tablet TAKE ONE (1) TABLET BY MOUTH TWO (2) TIMES DAILY 60 tablet 11  . atorvastatin (LIPITOR) 40 MG tablet TAKE 1 TABLET BY MOUTH  DAILY AT 6 PM. (Patient taking differently: Take 40 mg by mouth daily at 6 PM. ) 90 tablet 1  . carvedilol (COREG) 6.25 MG tablet Take 1 tablet (6.25 mg total) by mouth 2 (two) times daily with a meal. 60 tablet 6  . cholecalciferol (VITAMIN D) 1000 UNITS tablet Take 1,000 Units by mouth 2 (two) times daily.    Marland Kitchen doxycycline (VIBRA-TABS) 100 MG tablet Take 1 tablet (100 mg total) by mouth 2 (two) times daily. 60 tablet 5  . ferrous sulfate 325 (65 FE) MG tablet Take 1 tablet (325 mg total) by mouth daily with breakfast. 60 tablet 3  . glucose blood (ONE TOUCH ULTRA TEST) test strip Use three times a day 100 each 3  . Insulin Glargine (LANTUS SOLOSTAR) 100 UNIT/ML Solostar Pen INJECT 17 UNITS INTO SKIN AT BEDTIME 15 mL 3  . Insulin Pen Needle (PEN NEEDLES) 32G X 4 MM MISC 1 each by Does not apply route at bedtime. Use to inject Lantus each night. 90 each 1  .  losartan (COZAAR) 25 MG tablet Take 0.5 tablets (12.5 mg total) by mouth daily. 15 tablet 11  . rOPINIRole (REQUIP) 0.5 MG tablet TAKE 2 TABLETS BY MOUTH AT  BEDTIME (Patient taking differently: Take 1 mg by mouth at bedtime. ) 180 tablet 0  . torsemide (DEMADEX) 20 MG tablet Take 20 mg by mouth daily. One or two daily    . vitamin B-12 (CYANOCOBALAMIN) 1000 MCG tablet Take 1 tablet (1,000 mcg total) by mouth daily. 90 tablet 0  . Zinc Oxide 40 % PSTE Apply 1 application topically 3  (three) times daily. 113 g 2  . nitroGLYCERIN (NITROSTAT) 0.4 MG SL tablet Place 0.4 mg under the tongue every 5 (five) minutes as needed for chest pain (up to 3 doses).      No current facility-administered medications for this encounter.    Allergies  Allergen Reactions  . Benazepril Other (See Comments)    Unknown reaction at age 1-65 - possibly dizziness  . Angiotensin Receptor Blockers Other (See Comments)    Hypotension reaction  . Fe-Succ-C-Thre-B12-Des Stomach Other (See Comments)    Unknown reaction  . Sulfamethoxazole-Trimethoprim Other (See Comments)    Unknown reaction   Social History   Socioeconomic History  . Marital status: Widowed    Spouse name: Not on file  . Number of children: Not on file  . Years of education: Not on file  . Highest education level: Not on file  Occupational History  . Not on file  Social Needs  . Financial resource strain: Not on file  . Food insecurity:    Worry: Not on file    Inability: Not on file  . Transportation needs:    Medical: Not on file    Non-medical: Not on file  Tobacco Use  . Smoking status: Never Smoker  . Smokeless tobacco: Never Used  Substance and Sexual Activity  . Alcohol use: No  . Drug use: No  . Sexual activity: Not Currently  Lifestyle  . Physical activity:    Days per week: Not on file    Minutes per session: Not on file  . Stress: Not on file  Relationships  . Social connections:    Talks on phone: Not on file    Gets together: Not on file    Attends religious service: Not on file    Active member of club or organization: Not on file    Attends meetings of clubs or organizations: Not on file    Relationship status: Not on file  . Intimate partner violence:    Fear of current or ex partner: Not on file    Emotionally abused: Not on file    Physically abused: Not on file    Forced sexual activity: Not on file  Other Topics Concern  . Not on file  Social History Narrative   Widowed    Lives alone in [place]   2 children   Not routinely exercising   Family History  Problem Relation Age of Onset  . Clotting disorder Mother        Cerebral hemorrhage  . Emphysema Father        COD  . Lung cancer Brother   . Heart disease Neg Hx    Vitals:   12/16/18 0859  BP: (!) 144/72  Pulse: 66  SpO2: 100%  Weight: 98.6 kg (217 lb 6.4 oz)     Wt Readings from Last 3 Encounters:  12/16/18 98.6 kg (217 lb 6.4 oz)  11/28/18 104.7 kg (230 lb 12.8 oz)  11/28/18 104.3 kg (230 lb)    PHYSICAL EXAM: General: Appears chronically ill. Ambulated in the clinic with a walker. No resp difficulty HEENT: normal Neck: supple. no JVD. Carotids 2+ bilat; no bruits. No lymphadenopathy or thryomegaly appreciated. Cor: PMI nondisplaced. Irregular rate & rhythm. No rubs, gallops or murmurs. Lungs: clear Abdomen: soft, nontender, nondistended. No hepatosplenomegaly. No bruits or masses. Good bowel sounds. Extremities: no cyanosis, clubbing, rash, edema Neuro: alert & orientedx3, cranial nerves grossly intact. moves all 4 extremities w/o difficulty. Affect pleasant   ASSESSMENT & PLAN: I reviewed d/c summary from December 2019.  1. Chronic systolic CHF with prominent RV failure: Ischemic cardiomyopathy.  Echo in 7/19 showed LV EF 45-50% (improved) with mildly dilated/mildly dysfunction RV.  D-shaped septum suggested RV pressure/volume overload.   NYHA II-III Volume status stable. Continue 20 mg daily with extra 20 mg torsemide as needed. .  -- Continue coreg 6.25 mg BID, spironolactone 25 daily, and losartan 12.5 daily.  -- We will ask nephrology for copy of BMET  2. CAD sp CABG: No chest pain.  No S/S ischemia  - Continue atorvastatin 40 mg daily.   - No ASA with stable CAD on Eliquis.  3. Atrial fibrillation: Chronic. Given long-term atrial fibrillation, she is unlikely to successfully cardiovert.  - Continue Eliquis. 4. CKD stage 3:  Followed by nephrology with [facility].  5. H/o  CVA: Continue Eliquis.   6. OSA: Not using CPAP. She would like to be referred to Pulmonary at her next visit.   Follow up 3-4 months with Dr Aundra Dubin.  Lugene Beougher 12/16/2018

## 2018-12-23 DIAGNOSIS — I130 Hypertensive heart and chronic kidney disease with heart failure and stage 1 through stage 4 chronic kidney disease, or unspecified chronic kidney disease: Secondary | ICD-10-CM | POA: Diagnosis not present

## 2018-12-23 DIAGNOSIS — E1122 Type 2 diabetes mellitus with diabetic chronic kidney disease: Secondary | ICD-10-CM | POA: Diagnosis not present

## 2018-12-23 DIAGNOSIS — I5022 Chronic systolic (congestive) heart failure: Secondary | ICD-10-CM | POA: Diagnosis not present

## 2018-12-23 DIAGNOSIS — E785 Hyperlipidemia, unspecified: Secondary | ICD-10-CM | POA: Diagnosis not present

## 2018-12-23 DIAGNOSIS — I872 Venous insufficiency (chronic) (peripheral): Secondary | ICD-10-CM | POA: Diagnosis not present

## 2018-12-23 DIAGNOSIS — L89312 Pressure ulcer of right buttock, stage 2: Secondary | ICD-10-CM | POA: Diagnosis not present

## 2018-12-23 DIAGNOSIS — E1151 Type 2 diabetes mellitus with diabetic peripheral angiopathy without gangrene: Secondary | ICD-10-CM | POA: Diagnosis not present

## 2018-12-23 DIAGNOSIS — A4289 Other forms of actinomycosis: Secondary | ICD-10-CM | POA: Diagnosis not present

## 2018-12-23 DIAGNOSIS — G8929 Other chronic pain: Secondary | ICD-10-CM | POA: Diagnosis not present

## 2018-12-23 DIAGNOSIS — N183 Chronic kidney disease, stage 3 (moderate): Secondary | ICD-10-CM | POA: Diagnosis not present

## 2018-12-23 DIAGNOSIS — Z794 Long term (current) use of insulin: Secondary | ICD-10-CM | POA: Diagnosis not present

## 2018-12-23 DIAGNOSIS — M25552 Pain in left hip: Secondary | ICD-10-CM | POA: Diagnosis not present

## 2018-12-23 DIAGNOSIS — M4626 Osteomyelitis of vertebra, lumbar region: Secondary | ICD-10-CM | POA: Diagnosis not present

## 2018-12-23 DIAGNOSIS — Z7901 Long term (current) use of anticoagulants: Secondary | ICD-10-CM | POA: Diagnosis not present

## 2018-12-23 DIAGNOSIS — I4891 Unspecified atrial fibrillation: Secondary | ICD-10-CM | POA: Diagnosis not present

## 2018-12-23 DIAGNOSIS — Z8673 Personal history of transient ischemic attack (TIA), and cerebral infarction without residual deficits: Secondary | ICD-10-CM | POA: Diagnosis not present

## 2018-12-23 DIAGNOSIS — G4733 Obstructive sleep apnea (adult) (pediatric): Secondary | ICD-10-CM | POA: Diagnosis not present

## 2018-12-23 DIAGNOSIS — M4646 Discitis, unspecified, lumbar region: Secondary | ICD-10-CM | POA: Diagnosis not present

## 2018-12-23 DIAGNOSIS — I25810 Atherosclerosis of coronary artery bypass graft(s) without angina pectoris: Secondary | ICD-10-CM | POA: Diagnosis not present

## 2018-12-24 ENCOUNTER — Encounter (HOSPITAL_BASED_OUTPATIENT_CLINIC_OR_DEPARTMENT_OTHER): Payer: Medicare Other | Attending: Internal Medicine

## 2018-12-24 DIAGNOSIS — J449 Chronic obstructive pulmonary disease, unspecified: Secondary | ICD-10-CM | POA: Diagnosis not present

## 2018-12-24 DIAGNOSIS — M4626 Osteomyelitis of vertebra, lumbar region: Secondary | ICD-10-CM | POA: Insufficient documentation

## 2018-12-24 DIAGNOSIS — G4730 Sleep apnea, unspecified: Secondary | ICD-10-CM | POA: Insufficient documentation

## 2018-12-24 DIAGNOSIS — Z794 Long term (current) use of insulin: Secondary | ICD-10-CM | POA: Diagnosis not present

## 2018-12-24 DIAGNOSIS — E119 Type 2 diabetes mellitus without complications: Secondary | ICD-10-CM | POA: Diagnosis not present

## 2018-12-24 DIAGNOSIS — Z0389 Encounter for observation for other suspected diseases and conditions ruled out: Secondary | ICD-10-CM | POA: Diagnosis not present

## 2018-12-24 DIAGNOSIS — L89319 Pressure ulcer of right buttock, unspecified stage: Secondary | ICD-10-CM | POA: Diagnosis not present

## 2018-12-24 DIAGNOSIS — I5042 Chronic combined systolic (congestive) and diastolic (congestive) heart failure: Secondary | ICD-10-CM | POA: Diagnosis not present

## 2018-12-24 DIAGNOSIS — I4891 Unspecified atrial fibrillation: Secondary | ICD-10-CM | POA: Insufficient documentation

## 2018-12-24 DIAGNOSIS — I2510 Atherosclerotic heart disease of native coronary artery without angina pectoris: Secondary | ICD-10-CM | POA: Insufficient documentation

## 2018-12-24 DIAGNOSIS — I110 Hypertensive heart disease with heart failure: Secondary | ICD-10-CM | POA: Diagnosis not present

## 2018-12-24 DIAGNOSIS — L89329 Pressure ulcer of left buttock, unspecified stage: Secondary | ICD-10-CM | POA: Diagnosis not present

## 2018-12-24 DIAGNOSIS — L89309 Pressure ulcer of unspecified buttock, unspecified stage: Secondary | ICD-10-CM | POA: Diagnosis present

## 2018-12-25 ENCOUNTER — Other Ambulatory Visit: Payer: Self-pay

## 2018-12-25 DIAGNOSIS — H43822 Vitreomacular adhesion, left eye: Secondary | ICD-10-CM | POA: Diagnosis not present

## 2018-12-25 DIAGNOSIS — H353221 Exudative age-related macular degeneration, left eye, with active choroidal neovascularization: Secondary | ICD-10-CM | POA: Diagnosis not present

## 2018-12-25 DIAGNOSIS — H353112 Nonexudative age-related macular degeneration, right eye, intermediate dry stage: Secondary | ICD-10-CM | POA: Diagnosis not present

## 2018-12-25 DIAGNOSIS — H353222 Exudative age-related macular degeneration, left eye, with inactive choroidal neovascularization: Secondary | ICD-10-CM | POA: Diagnosis not present

## 2018-12-25 MED ORDER — LOSARTAN POTASSIUM 25 MG PO TABS: 12.5000 mg | ORAL_TABLET | Freq: Every day | ORAL | 3 refills | Status: DC

## 2018-12-26 ENCOUNTER — Encounter: Payer: Self-pay | Admitting: Infectious Diseases

## 2018-12-26 ENCOUNTER — Ambulatory Visit: Payer: Medicare Other | Admitting: Infectious Diseases

## 2018-12-26 VITALS — BP 126/73 | HR 60 | Temp 97.6°F | Ht 68.5 in | Wt 212.5 lb

## 2018-12-26 DIAGNOSIS — A429 Actinomycosis, unspecified: Secondary | ICD-10-CM | POA: Diagnosis not present

## 2018-12-26 DIAGNOSIS — M4620 Osteomyelitis of vertebra, site unspecified: Secondary | ICD-10-CM | POA: Diagnosis not present

## 2018-12-26 NOTE — Progress Notes (Signed)
Patient: [name]  DOB: 06/02/1941 MRN: [id number] PCP: Kathrene Alu, MD    Patient Active Problem List   Diagnosis Date Noted  . 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 Vertebral osteomyelitis ([place]) 10/06/2018    Priority: High  . Decubitus ulcer of coccyx, stage 2 ([place]) 10/13/2018  . CHF (congestive heart failure) ([place])   . Discitis   . Foot deformity 08/05/2018  . Intertrigo 08/05/2018  . Iron deficiency anemia 05/19/2018  . Cloudy urine 04/03/2018  . Dysuria 11/26/2017  . OSA on CPAP   . Hypokalemia 11/20/2017  . Longstanding persistent atrial fibrillation   . Ischemic cardiomyopathy   . Hypertension   . Hemiparesis ([place])   . Facial weakness, post-stroke   . Chronic systolic CHF (congestive heart failure) ([place])   . Chronic lower back pain   . Arthritis   . Venous stasis 09/13/2017  . Morbid obesity with BMI of 40.0-44.9, adult ([place]) 08/14/2017  . Bilateral leg edema 07/18/2017  . Blood loss anemia 04/09/2017  . History of CVA with residual deficit 03/26/2017  . Pulmonary hypertension ([place]) 03/26/2017  . Morbid obesity ([place]) 03/26/2017  . Gastrointestinal hemorrhage 03/26/2017  . Rectal bleeding   . Chronic pain syndrome   . Coronary artery disease involving coronary bypass graft of native heart without angina pectoris   . Benign essential HTN   . DM type 2 with diabetic peripheral neuropathy ([place])   . Thrombocytopenia ([place])   . Cerebrovascular accident (CVA) due to embolism of right middle cerebral artery ([place])   . CKD (chronic kidney disease) stage 3, GFR 30-59 ml/min (HCC) 03/30/2016  . Chronic anticoagulation 03/30/2016  . Hyperlipidemia 01/31/2009  . ACC/AHA stage C congestive heart failure due to ischemic cardiomyopathy ([place]) 01/31/2009  . Chronic systolic heart failure ([place]) 01/31/2009  . Atrial fibrillation ([place]) 01/31/2009  . CAD (coronary artery disease) 05/12/2008     Subjective:  CC:  Walk in  visit for acutely worsening pain following stopping IV antibiotics and switching to PO.   Brief ID Hx:  [name] is a 78 y.o. female with  PMHxT2DM, HFrEF (35-40%), CKD3, HTN, HLD, OSA on CPAP, afib on Eliquis, h/o CVA 2017, CAD s/p CABG 2009, morbid obesity.    Admitted to [facility] on 10/06/2018 with worsening back pain x 4 weeks. MRI findings at the time indicated L2-3 discitis/osteomyelitis with right paraspinal soft tissue inflammation amongst chronic ankylosis and foraminal stenosis. Aspiration of lumbar space revealed actinomyces species. She had 12-18 months before had 4 infected teeth pulled. Completed 6 weeks of IV penicillin - January 7th and transitioned to 500 mg amox QID. Was seen shortly after discharge from SNF with worsened back pain - inflammatory markers nearly normalized but switched to doxycycline for simpler regimen.   HPI:  Since our last visit she has steadily continued to improve. She rates her back pain a 5 on the bad days but she no longer is needing to sleep in recliner and her tylenol is helpful for her. She has completed physical therapy and feels stronger; now does laps at home inside. She went to see wound care center and recommended Destin for stage 2 pressure ulcer which is still present but feels better. Uses a gel cushion now when she sits in the chair. She has no fevers/chills. She is tolerating her doxycycline very well without nausea.   Review of Systems  Constitutional: Negative for chills, diaphoresis and fever.  Respiratory: Negative for cough.   Cardiovascular: Negative for chest pain and leg swelling.  Musculoskeletal: Positive for back pain.  Skin: Negative for rash.  Neurological: Positive for weakness (generalized). Negative for tingling and headaches.    Past Medical History:  Diagnosis Date  . ACC/AHA stage C congestive heart failure due to ischemic cardiomyopathy ([place]) 01/31/2009   Qualifier: Diagnosis of  By: Olevia Perches, MD, Glenetta Hew   . ANXIETY 01/31/2009   Qualifier: Diagnosis of  By: Lovette Cliche, CNA, Christy    . Arthritis    "qwhere" (03/30/2016)  . Benign essential HTN   . CAD (coronary artery disease) 05/2008   a. s/p CABG in 2009.  Marland Kitchen Cerebrovascular accident (CVA) due to embolism of right middle cerebral artery ([place])   . Chronic anticoagulation   . Chronic kidney disease (CKD), stage III (moderate) (HCC)   . Chronic lower back pain   . Chronic pain syndrome   . Chronic systolic CHF (congestive heart failure) ([place])   . DM type 2 with diabetic peripheral neuropathy ([place])   . Facial weakness, post-stroke   . Gastrointestinal hemorrhage 03/26/2017  . Hemiparesis ([place])   . History of CVA with residual deficit 03/26/2017  . Hyperlipidemia   . Hypertension   . Ischemic cardiomyopathy   . Longstanding persistent atrial fibrillation    a. Postoperative in 2009;  S/P transesophageal echocardiography-guided cardioversion, previously on Amiodarone and Coumadin. b. recurrent in April 2013 -> pt elected rate control strategy, initially Coumadin -> had stroke with subtherapeutic INR in 03/2016 and transitioned to Eliquis.  . Morbid obesity ([place]) 03/26/2017  . OSA on CPAP    severe OSA with AHI 34/hr  . Persistent atrial fibrillation   . Thrombocytopenia ([place])     Outpatient Medications Prior to Visit  Medication Sig Dispense Refill  . acetaminophen (TYLENOL) 650 MG CR tablet Take 650 mg by mouth every 8 (eight) hours as needed for pain.    Marland Kitchen apixaban (ELIQUIS) 5 MG TABS tablet TAKE ONE (1) TABLET BY MOUTH TWO (2) TIMES DAILY 60 tablet 11  . atorvastatin (LIPITOR) 40 MG tablet TAKE 1 TABLET BY MOUTH  DAILY AT 6 PM. (Patient taking differently: Take 40 mg by mouth daily at 6 PM. ) 90 tablet 1  . carvedilol (COREG) 6.25 MG tablet Take 1 tablet (6.25 mg total) by mouth 2 (two) times daily with a meal. 60 tablet 6  . cholecalciferol (VITAMIN D) 1000 UNITS tablet Take 1,000 Units by mouth 2 (two) times daily.    Marland Kitchen doxycycline  (VIBRA-TABS) 100 MG tablet Take 1 tablet (100 mg total) by mouth 2 (two) times daily. 60 tablet 5  . ferrous sulfate 325 (65 FE) MG tablet Take 1 tablet (325 mg total) by mouth daily with breakfast. 60 tablet 3  . glucose blood (ONE TOUCH ULTRA TEST) test strip Use three times a day 100 each 3  . Insulin Glargine (LANTUS SOLOSTAR) 100 UNIT/ML Solostar Pen INJECT 17 UNITS INTO SKIN AT BEDTIME 15 mL 3  . Insulin Pen Needle (PEN NEEDLES) 32G X 4 MM MISC 1 each by Does not apply route at bedtime. Use to inject Lantus each night. 90 each 1  . losartan (COZAAR) 25 MG tablet Take 0.5 tablets (12.5 mg total) by mouth daily. 45 tablet 3  . nitroGLYCERIN (NITROSTAT) 0.4 MG SL tablet Place 0.4 mg under the tongue every 5 (five) minutes as needed for chest pain (up to 3 doses).     Marland Kitchen rOPINIRole (REQUIP) 0.5  MG tablet TAKE 2 TABLETS BY MOUTH AT  BEDTIME (Patient taking differently: Take 1 mg by mouth at bedtime. ) 180 tablet 0  . vitamin B-12 (CYANOCOBALAMIN) 1000 MCG tablet Take 1 tablet (1,000 mcg total) by mouth daily. 90 tablet 0  . Zinc Oxide 40 % PSTE Apply 1 application topically 3 (three) times daily. 113 g 2  . torsemide (DEMADEX) 20 MG tablet Take 20 mg by mouth daily. One or two daily     No facility-administered medications prior to visit.      Allergies  Allergen Reactions  . Benazepril Other (See Comments)    Unknown reaction at age 69-65 - possibly dizziness  . Angiotensin Receptor Blockers Other (See Comments)    Hypotension reaction  . Fe-Succ-C-Thre-B12-Des Stomach Other (See Comments)    Unknown reaction  . Sulfamethoxazole-Trimethoprim Other (See Comments)    Unknown reaction    Social History   Tobacco Use  . Smoking status: Never Smoker  . Smokeless tobacco: Never Used  Substance Use Topics  . Alcohol use: No  . Drug use: No    Objective:   Vitals:   12/26/18 0839  BP: 126/73  Pulse: 60  Temp: 97.6 F (36.4 C)  TempSrc: Oral  Weight: 212 lb 8 oz (96.4 kg)    Height: 5' 8.5" (1.74 m)   Body mass index is 31.84 kg/m.  Physical Exam Vitals signs reviewed.  Constitutional:      General: She is not in acute distress.    Appearance: She is well-developed.     Comments: Seated comfortably in chair. Appears well today. Son accompanies her.   HENT:     Mouth/Throat:     Mouth: Mucous membranes are moist. No oral lesions.     Dentition: Normal dentition. No dental abscesses.     Pharynx: No oropharyngeal exudate.  Cardiovascular:     Rate and Rhythm: Normal rate and regular rhythm.  Pulmonary:     Effort: Pulmonary effort is normal.     Breath sounds: Normal breath sounds.  Abdominal:     General: There is no distension.     Palpations: Abdomen is soft.     Tenderness: There is no abdominal tenderness.  Musculoskeletal:        General: Tenderness: No tenderness over thoracolumbar spine down to sacrum with moderate pressure.      Comments: Can raise bilateral hips to gravity with improved strength. Balance improved. Uses walker.   Lymphadenopathy:     Cervical: No cervical adenopathy.  Skin:    General: Skin is warm and dry.  Neurological:     Mental Status: She is alert and oriented to person, place, and time.     Lab Results: Lab Results  Component Value Date   WBC 6.0 11/28/2018   HGB 10.1 (L) 11/28/2018   HCT 30.8 (L) 11/28/2018   MCV 96.0 11/28/2018   PLT 145 11/28/2018    Lab Results  Component Value Date   CREATININE 1.44 (H) 10/14/2018   BUN 36 (H) 10/14/2018   NA 136 10/14/2018   K 3.7 10/14/2018   CL 98 10/14/2018   CO2 28 10/14/2018    Lab Results  Component Value Date   ALT 14 03/25/2017   AST 27 03/25/2017   ALKPHOS 99 03/25/2017   BILITOT 0.8 03/25/2017    Sed Rate  Date Value  11/28/2018 38 mm/h (H)  11/19/2018 50 mm/h (H)  10/06/2018 80 mm/hr (H)   CRP  Date Value  11/28/2018  8.3 mg/L (H)  11/19/2018 11.1 mg/L (H)  10/07/2018 10.2 mg/dL (H)    IMAGING: 10/06/18 IMPRESSION: 1. Abnormal  L2-L3 disc, vertebrae, and surrounding soft tissues most compatible with Acute Discitis Osteomyelitis. Confluent edema in both vertebrae and the surrounding paraspinal soft tissues greater on the left. No drainable fluid collection or abscess at this time, although attention directed to the left paraspinal phlegmon on follow-up imaging. Underlying multifactorial moderate to severe spinal, left lateral recess, and left foraminal stenosis.  2. Chronic DISH related ankylosis from the lower thoracic spine to the L2 level. L3 through S1 spinal degeneration with moderate to severe spinal stenosis at L3-L4, moderate to severe right side lateral recess and foraminal stenosis at L4-L5.   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       Vertebral osteomyelitis ([place]) - Primary    Has done well over the last month since last office visit. No signs of ongoing infection or worsening process. No concern for abscess formation.  Continue with PT/recovery and pain control as discussed today with tylenol OTC.  Will have her return in 3 months and repeat blood work at that point. Minimum 6 months recommended treatment. Likely considering bone involvement will extend out full 12 months.         Janene Madeira, MSN, NP-C [facility] [place] Pager: [phone] Office: [phone]  12/26/18  10:27 AM

## 2018-12-26 NOTE — Assessment & Plan Note (Signed)
Has done well over the last month since last office visit. No signs of ongoing infection or worsening process. No concern for abscess formation.  Continue with PT/recovery and pain control as discussed today with tylenol OTC.  Will have her return in 3 months and repeat blood work at that point. Minimum 6 months recommended treatment. Likely considering bone involvement will extend out full 12 months.

## 2018-12-26 NOTE — Patient Instructions (Signed)
I am very encouraged to see your back is improving.  Please continue to work on your physical recovery to help strengthen the muscles around your back.   Continue your Destin cream to your bottom wound.   I would like for you to continue your Doxycycline twice a day with food for another 3 months. If you need [place] to see you sooner please call to let us know. We will plan to check lab work at this visit.

## 2018-12-26 NOTE — Assessment & Plan Note (Signed)
Continue doxycycline as above.

## 2018-12-29 ENCOUNTER — Ambulatory Visit (INDEPENDENT_AMBULATORY_CARE_PROVIDER_SITE_OTHER): Payer: Medicare Other | Admitting: Family Medicine

## 2018-12-29 ENCOUNTER — Encounter: Payer: Self-pay | Admitting: Family Medicine

## 2018-12-29 ENCOUNTER — Other Ambulatory Visit: Payer: Self-pay

## 2018-12-29 VITALS — BP 124/64 | HR 84 | Temp 98.2°F

## 2018-12-29 DIAGNOSIS — M25552 Pain in left hip: Secondary | ICD-10-CM | POA: Diagnosis not present

## 2018-12-29 DIAGNOSIS — R6889 Other general symptoms and signs: Secondary | ICD-10-CM | POA: Diagnosis not present

## 2018-12-29 DIAGNOSIS — Z8673 Personal history of transient ischemic attack (TIA), and cerebral infarction without residual deficits: Secondary | ICD-10-CM | POA: Diagnosis not present

## 2018-12-29 DIAGNOSIS — Z794 Long term (current) use of insulin: Secondary | ICD-10-CM | POA: Diagnosis not present

## 2018-12-29 DIAGNOSIS — M4646 Discitis, unspecified, lumbar region: Secondary | ICD-10-CM | POA: Diagnosis not present

## 2018-12-29 DIAGNOSIS — I872 Venous insufficiency (chronic) (peripheral): Secondary | ICD-10-CM | POA: Diagnosis not present

## 2018-12-29 DIAGNOSIS — E785 Hyperlipidemia, unspecified: Secondary | ICD-10-CM | POA: Diagnosis not present

## 2018-12-29 DIAGNOSIS — I5022 Chronic systolic (congestive) heart failure: Secondary | ICD-10-CM | POA: Diagnosis not present

## 2018-12-29 DIAGNOSIS — I130 Hypertensive heart and chronic kidney disease with heart failure and stage 1 through stage 4 chronic kidney disease, or unspecified chronic kidney disease: Secondary | ICD-10-CM | POA: Diagnosis not present

## 2018-12-29 DIAGNOSIS — E1122 Type 2 diabetes mellitus with diabetic chronic kidney disease: Secondary | ICD-10-CM | POA: Diagnosis not present

## 2018-12-29 DIAGNOSIS — I4891 Unspecified atrial fibrillation: Secondary | ICD-10-CM | POA: Diagnosis not present

## 2018-12-29 DIAGNOSIS — N183 Chronic kidney disease, stage 3 (moderate): Secondary | ICD-10-CM | POA: Diagnosis not present

## 2018-12-29 DIAGNOSIS — A4289 Other forms of actinomycosis: Secondary | ICD-10-CM | POA: Diagnosis not present

## 2018-12-29 DIAGNOSIS — E1151 Type 2 diabetes mellitus with diabetic peripheral angiopathy without gangrene: Secondary | ICD-10-CM | POA: Diagnosis not present

## 2018-12-29 DIAGNOSIS — Z7901 Long term (current) use of anticoagulants: Secondary | ICD-10-CM | POA: Diagnosis not present

## 2018-12-29 DIAGNOSIS — G8929 Other chronic pain: Secondary | ICD-10-CM | POA: Diagnosis not present

## 2018-12-29 DIAGNOSIS — I25810 Atherosclerosis of coronary artery bypass graft(s) without angina pectoris: Secondary | ICD-10-CM | POA: Diagnosis not present

## 2018-12-29 DIAGNOSIS — M4626 Osteomyelitis of vertebra, lumbar region: Secondary | ICD-10-CM | POA: Diagnosis not present

## 2018-12-29 DIAGNOSIS — L89312 Pressure ulcer of right buttock, stage 2: Secondary | ICD-10-CM | POA: Diagnosis not present

## 2018-12-29 DIAGNOSIS — G4733 Obstructive sleep apnea (adult) (pediatric): Secondary | ICD-10-CM | POA: Diagnosis not present

## 2018-12-29 LAB — POCT INFLUENZA A/B
Influenza A, POC: NEGATIVE
Influenza B, POC: NEGATIVE

## 2018-12-29 NOTE — Progress Notes (Signed)
inf

## 2018-12-29 NOTE — Patient Instructions (Signed)
We will let you know about the flu swab. If positive can start tamiflu   Otherwise stay well hydrated, plenty of rest. The cough medicine you have is perfect.

## 2018-12-29 NOTE — Progress Notes (Signed)
    Subjective:  [name] is a 78 y.o. female who presents to the [facility] today with a chief complaint of flu like symptoms.   HPI:  Sick since Saturday morning. Has had a cough productive of yellow sputum, nasal congestion, rhinorrhea, chills, body aches. No fever, SOB, n/v/d. Is on doxycyline from ID for spinal infection. Is using OTC using vicks rub and a cough syrup with dextromethorphan and guaifenesin.  ROS: Per HPI   Objective:  Physical Exam: BP 124/64   Pulse 84   Temp 98.2 F (36.8 C) (Oral)   LMP  (LMP Unknown)   SpO2 99%   Gen: NAD, resting comfortably HEENT: [place], AT. Conjunctiva normal. TMs pearly bilaterally.  Mucosa boggy bilaterally with profuse clear rhinorrhea.  Oropharynx nonerythematous, no exudates, no edema. CV: RRR with no murmurs appreciated Pulm: NWOB, CTAB with no crackles, wheezes, or rhonchi GI: Normal bowel sounds present. Soft, Nontender, Nondistended. MSK: no edema, cyanosis, or clubbing noted Skin: warm, dry  Results for orders placed or performed in visit on 12/29/18 (from the past 72 hour(s))  POCT Influenza A/B     Status: None   Collection Time: 12/29/18  4:00 PM  Result Value Ref Range   Influenza A, POC Negative Negative   Influenza B, POC Negative Negative     Assessment/Plan:  1. Flu-like symptoms Patient has had 2 days of flulike symptoms without fever.  She appears well-hydrated on exam is afebrile here today.  Her flu swab was negative.  Recommended supportive care at home with good p.o. hydration, rest, and continued over-the-counter cough syrup as needed. - POCT Influenza A/B   Bufford Lope, DO PGY-3, [facility] 12/29/2018 4:04 PM

## 2019-01-05 DIAGNOSIS — M4626 Osteomyelitis of vertebra, lumbar region: Secondary | ICD-10-CM | POA: Diagnosis not present

## 2019-01-05 DIAGNOSIS — E1151 Type 2 diabetes mellitus with diabetic peripheral angiopathy without gangrene: Secondary | ICD-10-CM | POA: Diagnosis not present

## 2019-01-05 DIAGNOSIS — I5022 Chronic systolic (congestive) heart failure: Secondary | ICD-10-CM | POA: Diagnosis not present

## 2019-01-05 DIAGNOSIS — A4289 Other forms of actinomycosis: Secondary | ICD-10-CM | POA: Diagnosis not present

## 2019-01-05 DIAGNOSIS — M4646 Discitis, unspecified, lumbar region: Secondary | ICD-10-CM | POA: Diagnosis not present

## 2019-01-05 DIAGNOSIS — G4733 Obstructive sleep apnea (adult) (pediatric): Secondary | ICD-10-CM | POA: Diagnosis not present

## 2019-01-05 DIAGNOSIS — E785 Hyperlipidemia, unspecified: Secondary | ICD-10-CM | POA: Diagnosis not present

## 2019-01-05 DIAGNOSIS — E1122 Type 2 diabetes mellitus with diabetic chronic kidney disease: Secondary | ICD-10-CM | POA: Diagnosis not present

## 2019-01-05 DIAGNOSIS — Z8673 Personal history of transient ischemic attack (TIA), and cerebral infarction without residual deficits: Secondary | ICD-10-CM | POA: Diagnosis not present

## 2019-01-05 DIAGNOSIS — I4891 Unspecified atrial fibrillation: Secondary | ICD-10-CM | POA: Diagnosis not present

## 2019-01-05 DIAGNOSIS — I130 Hypertensive heart and chronic kidney disease with heart failure and stage 1 through stage 4 chronic kidney disease, or unspecified chronic kidney disease: Secondary | ICD-10-CM | POA: Diagnosis not present

## 2019-01-05 DIAGNOSIS — G8929 Other chronic pain: Secondary | ICD-10-CM | POA: Diagnosis not present

## 2019-01-05 DIAGNOSIS — L89312 Pressure ulcer of right buttock, stage 2: Secondary | ICD-10-CM | POA: Diagnosis not present

## 2019-01-05 DIAGNOSIS — I25810 Atherosclerosis of coronary artery bypass graft(s) without angina pectoris: Secondary | ICD-10-CM | POA: Diagnosis not present

## 2019-01-05 DIAGNOSIS — Z7901 Long term (current) use of anticoagulants: Secondary | ICD-10-CM | POA: Diagnosis not present

## 2019-01-05 DIAGNOSIS — Z794 Long term (current) use of insulin: Secondary | ICD-10-CM | POA: Diagnosis not present

## 2019-01-05 DIAGNOSIS — I872 Venous insufficiency (chronic) (peripheral): Secondary | ICD-10-CM | POA: Diagnosis not present

## 2019-01-05 DIAGNOSIS — M25552 Pain in left hip: Secondary | ICD-10-CM | POA: Diagnosis not present

## 2019-01-05 DIAGNOSIS — N183 Chronic kidney disease, stage 3 (moderate): Secondary | ICD-10-CM | POA: Diagnosis not present

## 2019-01-12 ENCOUNTER — Other Ambulatory Visit: Payer: Self-pay

## 2019-01-12 DIAGNOSIS — N183 Chronic kidney disease, stage 3 (moderate): Secondary | ICD-10-CM | POA: Diagnosis not present

## 2019-01-12 DIAGNOSIS — E1122 Type 2 diabetes mellitus with diabetic chronic kidney disease: Secondary | ICD-10-CM | POA: Diagnosis not present

## 2019-01-12 DIAGNOSIS — E785 Hyperlipidemia, unspecified: Secondary | ICD-10-CM | POA: Diagnosis not present

## 2019-01-12 DIAGNOSIS — L89312 Pressure ulcer of right buttock, stage 2: Secondary | ICD-10-CM | POA: Diagnosis not present

## 2019-01-12 DIAGNOSIS — M25552 Pain in left hip: Secondary | ICD-10-CM | POA: Diagnosis not present

## 2019-01-12 DIAGNOSIS — I25810 Atherosclerosis of coronary artery bypass graft(s) without angina pectoris: Secondary | ICD-10-CM | POA: Diagnosis not present

## 2019-01-12 DIAGNOSIS — I5022 Chronic systolic (congestive) heart failure: Secondary | ICD-10-CM

## 2019-01-12 DIAGNOSIS — E1151 Type 2 diabetes mellitus with diabetic peripheral angiopathy without gangrene: Secondary | ICD-10-CM | POA: Diagnosis not present

## 2019-01-12 DIAGNOSIS — M4626 Osteomyelitis of vertebra, lumbar region: Secondary | ICD-10-CM | POA: Diagnosis not present

## 2019-01-12 DIAGNOSIS — G8929 Other chronic pain: Secondary | ICD-10-CM | POA: Diagnosis not present

## 2019-01-12 DIAGNOSIS — I872 Venous insufficiency (chronic) (peripheral): Secondary | ICD-10-CM | POA: Diagnosis not present

## 2019-01-12 DIAGNOSIS — Z794 Long term (current) use of insulin: Secondary | ICD-10-CM | POA: Diagnosis not present

## 2019-01-12 DIAGNOSIS — A4289 Other forms of actinomycosis: Secondary | ICD-10-CM | POA: Diagnosis not present

## 2019-01-12 DIAGNOSIS — Z7901 Long term (current) use of anticoagulants: Secondary | ICD-10-CM | POA: Diagnosis not present

## 2019-01-12 DIAGNOSIS — M4646 Discitis, unspecified, lumbar region: Secondary | ICD-10-CM | POA: Diagnosis not present

## 2019-01-12 DIAGNOSIS — I4891 Unspecified atrial fibrillation: Secondary | ICD-10-CM | POA: Diagnosis not present

## 2019-01-12 DIAGNOSIS — G4733 Obstructive sleep apnea (adult) (pediatric): Secondary | ICD-10-CM | POA: Diagnosis not present

## 2019-01-12 DIAGNOSIS — Z8673 Personal history of transient ischemic attack (TIA), and cerebral infarction without residual deficits: Secondary | ICD-10-CM | POA: Diagnosis not present

## 2019-01-12 DIAGNOSIS — I130 Hypertensive heart and chronic kidney disease with heart failure and stage 1 through stage 4 chronic kidney disease, or unspecified chronic kidney disease: Secondary | ICD-10-CM | POA: Diagnosis not present

## 2019-01-14 MED ORDER — CARVEDILOL 6.25 MG PO TABS: 6.2500 mg | ORAL_TABLET | Freq: Two times a day (BID) | ORAL | 6 refills | Status: DC

## 2019-01-14 MED ORDER — ROPINIROLE HCL 0.5 MG PO TABS: 1.0000 mg | ORAL_TABLET | Freq: Every day | ORAL | 0 refills | Status: DC

## 2019-01-19 ENCOUNTER — Ambulatory Visit: Payer: Medicare Other | Admitting: Infectious Diseases

## 2019-02-12 ENCOUNTER — Ambulatory Visit (HOSPITAL_COMMUNITY)
Admission: RE | Admit: 2019-02-12 | Discharge: 2019-02-12 | Disposition: A | Discharge status: Home / Self Care | Payer: Medicare Other | Source: Ambulatory Visit | Attending: Cardiology | Admitting: Cardiology

## 2019-02-12 ENCOUNTER — Other Ambulatory Visit: Payer: Self-pay

## 2019-02-12 DIAGNOSIS — I5022 Chronic systolic (congestive) heart failure: Secondary | ICD-10-CM | POA: Diagnosis not present

## 2019-02-12 DIAGNOSIS — N183 Chronic kidney disease, stage 3 unspecified: Secondary | ICD-10-CM

## 2019-02-12 DIAGNOSIS — I25810 Atherosclerosis of coronary artery bypass graft(s) without angina pectoris: Secondary | ICD-10-CM

## 2019-02-12 DIAGNOSIS — I4819 Other persistent atrial fibrillation: Secondary | ICD-10-CM

## 2019-02-12 DIAGNOSIS — I509 Heart failure, unspecified: Secondary | ICD-10-CM

## 2019-02-12 MED ORDER — TORSEMIDE 20 MG PO TABS: 20.0000 mg | ORAL_TABLET | Freq: Every day | ORAL | 3 refills | Status: DC

## 2019-02-12 MED ORDER — NITROGLYCERIN 0.4 MG SL SUBL: 0.4000 mg | SUBLINGUAL_TABLET | SUBLINGUAL | 5 refills | Status: DC | PRN

## 2019-02-12 NOTE — Progress Notes (Signed)
Spoke with the pt. Pt preferred to continue with her current home health. Advised that if she isnt able to continue with her current cna (issues) to give [place] a call to start advanced. Reviewed medication changes and will put pt on recall for July appt.

## 2019-02-12 NOTE — Patient Instructions (Addendum)
You have been referred to [facility] for physical therapy and nursing. After our conversation you preferred to continue with your current home health. Please let us know if you change your mind.  REFILLED Nitroglycerin to [place] for ONE DAY ONLY. THEN RESTART Torsemide 20mg  (1 tab) daily.  Please follow up with Dr. Aundra Dubin in 3 months

## 2019-02-12 NOTE — Addendum Note (Signed)
Encounter addended by: Marlise Eves, RN on: 02/12/2019 12:05 PM  Actions taken: Pharmacy for encounter modified, Visit diagnoses modified, Order list changed, Diagnosis association updated, Clinical Note Signed

## 2019-02-12 NOTE — Progress Notes (Addendum)
Heart Failure TeleHealth Note  Due to national recommendations of social distancing due to [place] 19, telehealth visit is felt to be most appropriate for this patient at this time.  I discussed the limitations, risks, security and privacy concerns of performing an evaluation and management service by telephone and the availability of in person appointments. I also discussed with the patient that there may be a patient responsible charge related to this service. The patient expressed understanding and agreed to proceed.   ID:  [name], DOB 13-Sep-1941, MRN [id number]  Location: Home  Provider location: [address] [place] Type of Visit: Add on, Established patient  PCP:  Kathrene Alu, MD  Cardiologist:  Fransico Him, MD Primary HF: Dr Aundra Dubin  Chief Complaint: Low BP   History of Present Illness: [name] is a 78 y.o. femalewith history of CAD status post CABG 2009, ischemic cardiomyopathy with chronic systolic CHF, persistent atrial fibrillation with stroke in 03/2016 when INR was subtherapeutic, stage III CKD, morbid obesity, pulmonary hypertension, diabetes, GI bleed 03/2017, and severe sleep apnea.   She presents via Psychiatric nurse for a telehealth visit today. She is doing okay. She has been taking torsemide 40 mg daily (instead of 20 mg daily) for the last week due to weight gain. Weight is 208 lbs on her home scale, which is still down from 217 on our last weight in clinic. SOB is at baseline. Very little edema. No orthopnea or PND. No dizziness or CP. No bleeding on Eliquis. No cough, fever, or chills. No sick contacts. Taking all medications. Someone cleaned her fridge out and threw out her nitro. Requesting a refill. Only drinks 32 oz daily. Limits salt intake. Son does cooking and grocery shopping for her. Her [place] aide is pregnant so is no longer able to come help her with bathing.   Weights 204 to 208 lb (down from 217 lbs in clinic).  BP: 102/48  yesterday  Pt denies symptoms of cough, fevers, chills, or new SOB worrisome for COVID 19.   Past Medical History:  Diagnosis Date  . ACC/AHA stage C congestive heart failure due to ischemic cardiomyopathy ([place]) 01/31/2009   Qualifier: Diagnosis of  By: Olevia Perches, MD, Glenetta Hew   . ANXIETY 01/31/2009   Qualifier: Diagnosis of  By: Lovette Cliche, CNA, Christy    . Arthritis    "qwhere" (03/30/2016)  . Benign essential HTN   . CAD (coronary artery disease) 05/2008   a. s/p CABG in 2009.  Marland Kitchen Cerebrovascular accident (CVA) due to embolism of right middle cerebral artery ([place])   . Chronic anticoagulation   . Chronic kidney disease (CKD), stage III (moderate) (HCC)   . Chronic lower back pain   . Chronic pain syndrome   . Chronic systolic CHF (congestive heart failure) ([place])   . DM type 2 with diabetic peripheral neuropathy ([place])   . Facial weakness, post-stroke   . Gastrointestinal hemorrhage 03/26/2017  . Hemiparesis ([place])   . History of CVA with residual deficit 03/26/2017  . Hyperlipidemia   . Hypertension   . Ischemic cardiomyopathy   . Longstanding persistent atrial fibrillation    a. Postoperative in 2009;  S/P transesophageal echocardiography-guided cardioversion, previously on Amiodarone and Coumadin. b. recurrent in April 2013 -> pt elected rate control strategy, initially Coumadin -> had stroke with subtherapeutic INR in 03/2016 and transitioned to Eliquis.  . Morbid obesity ([place]) 03/26/2017  . OSA on CPAP    severe  OSA with AHI 34/hr  . Persistent atrial fibrillation   . Thrombocytopenia ([place])    Past Surgical History:  Procedure Laterality Date  . APPENDECTOMY  1946  . CATARACT EXTRACTION Right 2012  . COLONOSCOPY WITH PROPOFOL Left 03/27/2017   Procedure: COLONOSCOPY WITH PROPOFOL;  Surgeon: Ronnette Juniper, MD;  Location: [facility];  Service: Gastroenterology;  Laterality: Left;  . CORONARY ANGIOPLASTY WITH STENT PLACEMENT  2009   "put 2 stents in"  . HERNIA REPAIR    . IR  LUMBAR [place] W/IMG GUIDE  10/08/2018  . JOINT REPLACEMENT    . TEE WITH CARDIOVERSION     Postoperative in 2009;  S/P transesophageal echocardiography-guided cardioversion,  . TOTAL KNEE ARTHROPLASTY Right 1994  . VENTRAL HERNIA REPAIR  10/1999   With mesh     Current Outpatient Medications  Medication Sig Dispense Refill  . acetaminophen (TYLENOL) 650 MG CR tablet Take 650 mg by mouth every 8 (eight) hours as needed for pain.    Marland Kitchen apixaban (ELIQUIS) 5 MG TABS tablet TAKE ONE (1) TABLET BY MOUTH TWO (2) TIMES DAILY 60 tablet 11  . atorvastatin (LIPITOR) 40 MG tablet TAKE 1 TABLET BY MOUTH  DAILY AT 6 PM. (Patient taking differently: Take 40 mg by mouth daily at 6 PM. ) 90 tablet 1  . carvedilol (COREG) 6.25 MG tablet Take 1 tablet (6.25 mg total) by mouth 2 (two) times daily with a meal. 60 tablet 6  . cholecalciferol (VITAMIN D) 1000 UNITS tablet Take 1,000 Units by mouth daily.     Marland Kitchen losartan (COZAAR) 25 MG tablet Take 0.5 tablets (12.5 mg total) by mouth daily. 45 tablet 3  . torsemide (DEMADEX) 20 MG tablet Take 20 mg by mouth daily. One or two daily    . doxycycline (VIBRA-TABS) 100 MG tablet Take 1 tablet (100 mg total) by mouth 2 (two) times daily. 60 tablet 5  . ferrous sulfate 325 (65 FE) MG tablet Take 1 tablet (325 mg total) by mouth daily with breakfast. 60 tablet 3  . glucose blood (ONE TOUCH ULTRA TEST) test strip Use three times a day 100 each 3  . Insulin Glargine (LANTUS SOLOSTAR) 100 UNIT/ML Solostar Pen INJECT 17 UNITS INTO SKIN AT BEDTIME 15 mL 3  . Insulin Pen Needle (PEN NEEDLES) 32G X 4 MM MISC 1 each by Does not apply route at bedtime. Use to inject Lantus each night. 90 each 1  . nitroGLYCERIN (NITROSTAT) 0.4 MG SL tablet Place 0.4 mg under the tongue every 5 (five) minutes as needed for chest pain (up to 3 doses).     Marland Kitchen rOPINIRole (REQUIP) 0.5 MG tablet Take 2 tablets (1 mg total) by mouth at bedtime for 30 days. 60 tablet 0  . vitamin B-12  (CYANOCOBALAMIN) 1000 MCG tablet Take 1 tablet (1,000 mcg total) by mouth daily. 90 tablet 0  . Zinc Oxide 40 % PSTE Apply 1 application topically 3 (three) times daily. 113 g 2   No current facility-administered medications for this encounter.     Allergies:   Benazepril; Angiotensin receptor blockers; Fe-succ-c-thre-b12-des stomach; and Sulfamethoxazole-trimethoprim   Social History:  The patient  reports that she has never smoked. She has never used smokeless tobacco. She reports that she does not drink alcohol or use drugs.   Family History:  The patient's family history includes Clotting disorder in her mother; Emphysema in her father; Lung cancer in her brother.   ROS:  Please see the history of present illness.  All other systems are personally reviewed and negative.   Exam:  [facility] Health Call) Lungs: Normal respiratory effort with conversation.  Neuro: Alert & oriented x 3.   Recent Labs: 10/14/2018: BUN 36; Creatinine, Ser 1.44; Potassium 3.7; Sodium 136 11/28/2018: Hemoglobin 10.1; Platelets 145  Personally reviewed   Wt Readings from Last 3 Encounters:  12/26/18 96.4 kg (212 lb 8 oz)  12/16/18 98.6 kg (217 lb 6.4 oz)  11/28/18 104.7 kg (230 lb 12.8 oz)      ASSESSMENT AND PLAN:  1. Chronic systolic CHF with prominent RV failure: Ischemic cardiomyopathy.   - Echo 09/2018: EF 45-50%, RV mod reduced - Volume sounds dry. Weight is down - Hold torsemide x 1 day - Cut torsemide back to 20 mg daily with extra 20 mg torsemide as needed. - Continue coreg 6.25 mg BID, spironolactone 25 daily, and losartan 12.5 daily.  - Discussed limiting fluid and salt intake. Discussed importance of daily weights.   2. CAD sp CABG: - No CP - Continue atorvastatin 40 mg daily.   - No ASA with stable CAD on Eliquis.   3. Atrial fibrillation: Chronic. Given long-term atrial fibrillation, she is unlikely to successfully cardiovert.  - Continue Eliquis. Denies bleeding.   4. CKD stage 3:   - Followed by nephrology with [facility].  - Creatinine 1.44 on 10/2018.   5. H/o CVA: Continue Eliquis.    6. OSA: Not using CPAP. Declines pulmonary consult today.  - Needs to follow up with Dr Radford Pax  7. Goals of Care - Has spoken to her son about her wishes. They have filled out paperwork and returned to PCP.   COVID screen The patient does not have any symptoms that suggest any further testing/ screening at this time.  Social distancing reinforced today.   Food Insecurity Son is picking up groceries for her currently.   Patient Risk: After full review of this patients clinical status, I feel that they are at moderate risk for cardiac decompensation at this time.  Can push back follow up with Dr Aundra Dubin to 3 months from now. Will re-refer to [facility] for [facility] RN.   Today, I have spent 22 minutes with the patient with telehealth technology discussing fluid restriction, daily weights, COVID, and heart failure.    Signed, Georgiana Shore, NP  02/12/2019 11:02 AM  [facility] [address] [place] [address] [phone] (office) [phone] (fax)

## 2019-02-14 ENCOUNTER — Other Ambulatory Visit: Payer: Self-pay | Admitting: Family Medicine

## 2019-02-23 ENCOUNTER — Ambulatory Visit: Payer: Medicare Other | Admitting: Family Medicine

## 2019-02-23 ENCOUNTER — Other Ambulatory Visit (HOSPITAL_COMMUNITY): Payer: Self-pay | Admitting: Student

## 2019-02-23 ENCOUNTER — Ambulatory Visit: Payer: Medicare Other | Admitting: Podiatry

## 2019-02-24 ENCOUNTER — Other Ambulatory Visit: Payer: Self-pay | Admitting: Family Medicine

## 2019-02-25 ENCOUNTER — Telehealth: Payer: Self-pay | Admitting: *Deleted

## 2019-02-25 NOTE — Telephone Encounter (Signed)
Reached out to the patient to ask about getting a download and patient states she no longer wears her cpap because it made sores in her nose and caused nose bleeds, she was advised by a doctor in the hospital to stop wearing it. She stopped wearing it as of 10/06/2018. Patient received her new cpap on 09/03/2017 and she wants a new one but she is ineligible for 3 years when her unit is 80 years old. Patient was annoyed and hung up.

## 2019-03-04 ENCOUNTER — Other Ambulatory Visit (HOSPITAL_COMMUNITY): Payer: Self-pay | Admitting: Student

## 2019-03-10 ENCOUNTER — Other Ambulatory Visit (HOSPITAL_COMMUNITY): Payer: Self-pay | Admitting: Student

## 2019-03-16 ENCOUNTER — Encounter (HOSPITAL_COMMUNITY): Payer: Medicare Other | Admitting: Cardiology

## 2019-03-18 ENCOUNTER — Other Ambulatory Visit: Payer: Self-pay | Admitting: Family Medicine

## 2019-03-30 ENCOUNTER — Other Ambulatory Visit: Payer: Self-pay

## 2019-03-30 DIAGNOSIS — I5022 Chronic systolic (congestive) heart failure: Secondary | ICD-10-CM

## 2019-03-30 MED ORDER — CARVEDILOL 6.25 MG PO TABS: 6.2500 mg | ORAL_TABLET | Freq: Two times a day (BID) | ORAL | 6 refills | Status: DC

## 2019-03-31 ENCOUNTER — Telehealth: Payer: Self-pay

## 2019-03-31 NOTE — Telephone Encounter (Signed)
We can do an AV visit next week if she prefer so we can check in but keep her at home.   I am OK with her changing her appointment to June however if she continues to prefer that option. Thank you

## 2019-03-31 NOTE — Telephone Encounter (Signed)
Received message in triage for return call to reschedule upcoming appointment. LPN called patient and rescheduled appointment to 05/06/19 with S. Doren Custard, NP. Patient stated she just refilled her Doxycycline and has one additional refill left. Routing to NP to make aware.

## 2019-04-07 ENCOUNTER — Ambulatory Visit: Payer: Medicare Other | Admitting: Infectious Diseases

## 2019-04-07 ENCOUNTER — Other Ambulatory Visit: Payer: Self-pay | Admitting: Family Medicine

## 2019-04-07 ENCOUNTER — Other Ambulatory Visit: Payer: Self-pay | Admitting: *Deleted

## 2019-04-07 MED ORDER — POTASSIUM CHLORIDE CRYS ER 20 MEQ PO TBCR: 40.0000 meq | EXTENDED_RELEASE_TABLET | Freq: Every day | ORAL | 11 refills | Status: DC

## 2019-04-07 MED ORDER — GLUCOSE BLOOD VI STRP: ORAL_STRIP | 3 refills | Status: DC

## 2019-04-07 MED ORDER — ONETOUCH DELICA PLUS LANCET33G MISC: 1.0000 "application " | Freq: Every day | 6 refills | Status: DC

## 2019-04-07 NOTE — Progress Notes (Signed)
Lancets ordered.

## 2019-04-07 NOTE — Telephone Encounter (Signed)
Pt also needs lancet one touch del plus. Deseree Kennon Holter, CMA

## 2019-04-15 ENCOUNTER — Other Ambulatory Visit (HOSPITAL_COMMUNITY): Payer: Self-pay

## 2019-05-06 ENCOUNTER — Ambulatory Visit: Payer: Medicare Other | Admitting: Infectious Diseases

## 2019-05-18 ENCOUNTER — Other Ambulatory Visit: Payer: Self-pay | Admitting: *Deleted

## 2019-05-18 MED ORDER — DOXYCYCLINE HYCLATE 100 MG PO TABS: 100.0000 mg | ORAL_TABLET | Freq: Two times a day (BID) | ORAL | 0 refills | Status: DC

## 2019-05-19 ENCOUNTER — Other Ambulatory Visit: Payer: Self-pay

## 2019-05-19 NOTE — Patient Outreach (Signed)
[facility]) Care Management  05/19/2019  [name] [birth year] [id number]    Telephone Screen Referral Date :05/07/19 Referral Source: [facility] High Risk Referral Reason: Screening for possible needs Insurance:[employer]  Outreach attempt # 1 to patient. Patient was able to complete the screening.    Social:  The patient lives in the home alone.  She states that her son comes to the home to help her almost daily.  Her son takes her to her appointments. The patient has an aid that come to the home two days a week. She is independent /assist with her care. Equipment in the home included, walker, scale, CBG  Meter, pulse ox, and blood pressure cuff.  Conditions: the patient states that she has Diabetes, Heart failure, HTN and CKD stage III. She states that she is on the [facility] program and they monitor her blood sugars, CHF, Blood pressure and check her pulse ox.  She states they provide her with education for her conditions.    Medications: The patient states that she is on 11 medications.  She is able to manage her medications.  She is having trouble paying for her eliquis.  She states that her PCP office is filling out paper work for medications assistance for her..    Appointments: The patient states that she has an appointment with Infectious diease on July 23 rd.     Consent/ Services:HIPAA Verified by the patient. Explained to the patient health coach role and [facility] services.   Plan: RN Health Coach will close the case at this time.no further interventions needed.  RN Health Coach will send the patient a letter with a pamphlet for future reference.  Lazaro Arms RN, BSN, [place] Direct Dial:  [phone] Fax: [phone]

## 2019-05-22 ENCOUNTER — Telehealth (HOSPITAL_COMMUNITY): Payer: Self-pay

## 2019-05-22 DIAGNOSIS — I5022 Chronic systolic (congestive) heart failure: Secondary | ICD-10-CM

## 2019-05-22 NOTE — Telephone Encounter (Signed)
Received call from [employer] stating that pt gain 5lbs over night. Called pt. Pt denied shortness of breath and swelling. Pt had been taking 60mg  of torsemide qam and 40mg  qpm instead of the prescribed dose of 20mg  daily. She had also been taking 25mg  of spironolactone daily and recently run out. She called the pharmacy and they told her she could not get any refills on this medication as it had been d/ed. Brought to Amy NP-C's attention all of the above. Per Amy NP-C pt to continue regime as prescribed, no spiro and torsemide 20 daily. Also wanted labs next week. Orders placed for bmet and f/u w [facility].

## 2019-05-25 ENCOUNTER — Telehealth (HOSPITAL_COMMUNITY): Payer: Self-pay | Admitting: Pharmacy Technician

## 2019-05-25 ENCOUNTER — Other Ambulatory Visit: Payer: Self-pay

## 2019-05-25 ENCOUNTER — Other Ambulatory Visit (HOSPITAL_COMMUNITY): Payer: Self-pay

## 2019-05-25 ENCOUNTER — Ambulatory Visit (HOSPITAL_COMMUNITY)
Admission: RE | Admit: 2019-05-25 | Discharge: 2019-05-25 | Disposition: A | Discharge status: Home / Self Care | Payer: Medicare Other | Source: Ambulatory Visit | Attending: Internal Medicine | Admitting: Internal Medicine

## 2019-05-25 DIAGNOSIS — I5022 Chronic systolic (congestive) heart failure: Secondary | ICD-10-CM | POA: Diagnosis not present

## 2019-05-25 LAB — BASIC METABOLIC PANEL
Anion gap: 11 (ref 5–15)
BUN: 90 mg/dL — ABNORMAL HIGH (ref 8–23)
CO2: 25 mmol/L (ref 22–32)
Calcium: 10.2 mg/dL (ref 8.9–10.3)
Chloride: 105 mmol/L (ref 98–111)
Creatinine, Ser: 2.49 mg/dL — ABNORMAL HIGH (ref 0.44–1.00)
GFR calc Af Amer: 20 mL/min — ABNORMAL LOW (ref >60)
GFR calc non Af Amer: 18 mL/min — ABNORMAL LOW (ref >60)
Glucose, Bld: 90 mg/dL (ref 70–99)
Potassium: 4.3 mmol/L (ref 3.5–5.1)
Sodium: 141 mmol/L (ref 135–145)

## 2019-05-25 MED ORDER — APIXABAN 5 MG PO TABS: 5.0000 mg | ORAL_TABLET | Freq: Two times a day (BID) | ORAL | 11 refills | Status: DC

## 2019-05-25 NOTE — Telephone Encounter (Signed)
Received completed [employer] application from patient for Eliquis assistance.  Faxed application today.  Charlann Boxer, CPhT

## 2019-05-26 ENCOUNTER — Telehealth (HOSPITAL_COMMUNITY): Payer: Self-pay

## 2019-05-26 NOTE — Telephone Encounter (Signed)
Spoke with patient, pt states she already stopped Spironolactone since conversation with [place]. Pt verbalized other instructions to stop losartan and hold torsemide for 3 days THEN resume 20mg  daily.  Pt repeated instructions clearly. Lab appt made for 7/22.  Pt has office f/u 7/28 with MD.

## 2019-05-26 NOTE — Telephone Encounter (Signed)
-----   Message from Larey Dresser, MD sent at 05/26/2019 12:07 AM EDT ----- Patient apparently had been taking considerably more torsemide than we thought she was (60 qam/40 qpm rather than 20 daily).  She needs to stop losartan and spironolactone.  She needs to hold torsemide for 3 days then decrease to 20 mg daily.  BMET in 1 week. She needs office followup soon.

## 2019-05-27 ENCOUNTER — Telehealth (HOSPITAL_COMMUNITY): Payer: Self-pay | Admitting: Pharmacy Technician

## 2019-05-27 NOTE — Telephone Encounter (Signed)
Patient called into office this morning regarding the status of the application for BMS that she dropped off. Called BMS and they stated that the application would be processed today. Approval or denial will be sent to the office via fax and will also be mailed to the patient.  Called and spoke with patient and made her aware. Will call her with results as soon as they are received.   Charlann Boxer, CPhT

## 2019-05-29 ENCOUNTER — Telehealth (HOSPITAL_COMMUNITY): Payer: Self-pay | Admitting: Pharmacy Technician

## 2019-05-29 NOTE — Telephone Encounter (Signed)
Called to check the status of Patient Assistance application, it has yet to be processed. Agent said that it should definitely be done by Monday. Called to confirm patient has enough medication, had to leave her a callback message. If she does not, I will call the [facility] and can request an urgent review of the application.  Charlann Boxer, CPhT

## 2019-05-29 NOTE — Telephone Encounter (Signed)
Patient called back and stated that she has at least a week of Eliquis. Will follow up with BMS on Monday.  Charlann Boxer, CPhT

## 2019-06-02 ENCOUNTER — Telehealth (HOSPITAL_COMMUNITY): Payer: Self-pay

## 2019-06-02 NOTE — Telephone Encounter (Signed)
Please set up f/u for me tomorrow.   thks Amy Clegg NP-C

## 2019-06-02 NOTE — Telephone Encounter (Signed)
Returned phone call regarding increased weight gain, suggested to change lab appt to APP visit, pt refused stating she had another appt to get to tomorrow morning. Will keep lab appt for BMET. Informed pt no med changes will be made until lab resulted. Has f/u with DM next Tuesday. Amy aware

## 2019-06-02 NOTE — Telephone Encounter (Signed)
Pt called stating increased weight gain due to meds being decreased. Has lab appt tomorrow but would like to know if she can increase meds again. Do you want me to turn lab visit into APP visit? Please advise.

## 2019-06-03 ENCOUNTER — Ambulatory Visit (HOSPITAL_COMMUNITY)
Admission: RE | Admit: 2019-06-03 | Discharge: 2019-06-03 | Disposition: A | Discharge status: Home / Self Care | Payer: Medicare Other | Source: Ambulatory Visit | Attending: Internal Medicine | Admitting: Internal Medicine

## 2019-06-03 ENCOUNTER — Other Ambulatory Visit: Payer: Self-pay

## 2019-06-03 DIAGNOSIS — I5022 Chronic systolic (congestive) heart failure: Secondary | ICD-10-CM | POA: Insufficient documentation

## 2019-06-03 LAB — BASIC METABOLIC PANEL
Anion gap: 7 (ref 5–15)
BUN: 60 mg/dL — ABNORMAL HIGH (ref 8–23)
CO2: 24 mmol/L (ref 22–32)
Calcium: 9.9 mg/dL (ref 8.9–10.3)
Chloride: 110 mmol/L (ref 98–111)
Creatinine, Ser: 1.86 mg/dL — ABNORMAL HIGH (ref 0.44–1.00)
GFR calc Af Amer: 29 mL/min — ABNORMAL LOW (ref >60)
GFR calc non Af Amer: 25 mL/min — ABNORMAL LOW (ref >60)
Glucose, Bld: 57 mg/dL — ABNORMAL LOW (ref 70–99)
Potassium: 5 mmol/L (ref 3.5–5.1)
Sodium: 141 mmol/L (ref 135–145)

## 2019-06-04 ENCOUNTER — Encounter: Payer: Self-pay | Admitting: Infectious Diseases

## 2019-06-04 ENCOUNTER — Ambulatory Visit (INDEPENDENT_AMBULATORY_CARE_PROVIDER_SITE_OTHER): Payer: Medicare Other | Admitting: Infectious Diseases

## 2019-06-04 ENCOUNTER — Ambulatory Visit: Payer: Medicare Other | Admitting: Infectious Diseases

## 2019-06-04 DIAGNOSIS — M4620 Osteomyelitis of vertebra, site unspecified: Secondary | ICD-10-CM

## 2019-06-04 DIAGNOSIS — A429 Actinomycosis, unspecified: Secondary | ICD-10-CM

## 2019-06-04 DIAGNOSIS — I5022 Chronic systolic (congestive) heart failure: Secondary | ICD-10-CM

## 2019-06-04 NOTE — Assessment & Plan Note (Signed)
Continue doxycycline for now until lab work results next week.  Clinically it appears she is doing well.

## 2019-06-04 NOTE — Assessment & Plan Note (Signed)
Good response with treating her infection both clinically and through lab monitoring.  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  We will have her repeat her blood work next week to see how she has done on 6 months of oral therapy.  If everything looks good we will consider stopping her infection and monitor off antibiotics.  I told her that she will never have no pain and my hope is to reduce it back to very comparably what it was prior to her infection.  She understands and will wait my for my call next week.

## 2019-06-04 NOTE — Progress Notes (Signed)
Patient: [name]  DOB: 12/09/1940 MRN: [id number] PCP: Lennox Solders, MD   Virtual Visit via Telephone Note  I connected with [name] on 06/04/19 at  3:30 PM EDT by telephone and verified that I am speaking with the correct person using two identifiers.   I discussed the limitations, risks, security and privacy concerns of performing an evaluation and management service by telephone and the availability of in person appointments. I also discussed with the patient that there may be a patient responsible charge related to this service. The patient expressed understanding and agreed to proceed.   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 10/13/2018    Priority: High   Vertebral osteomyelitis (HCC) 10/06/2018    Priority: High   Decubitus ulcer of coccyx, stage 2 (HCC) 10/13/2018   CHF (congestive heart failure) (HCC)    Discitis    Foot deformity 08/05/2018   Intertrigo 08/05/2018   Iron deficiency anemia 05/19/2018   Cloudy urine 04/03/2018   Dysuria 11/26/2017   OSA on CPAP    Hypokalemia 11/20/2017   Longstanding persistent atrial fibrillation    Ischemic cardiomyopathy    Hypertension    Hemiparesis (HCC)    Facial weakness, post-stroke    Chronic systolic CHF (congestive heart failure) (HCC)    Chronic lower back pain    Arthritis    Venous stasis 09/13/2017   Morbid obesity with BMI of 40.0-44.9, adult (HCC) 08/14/2017   Bilateral leg edema 07/18/2017   Blood loss anemia 04/09/2017   History of CVA with residual deficit 03/26/2017   Pulmonary hypertension (HCC) 03/26/2017   Morbid obesity (HCC) 03/26/2017   Gastrointestinal hemorrhage 03/26/2017   Rectal bleeding    Chronic pain syndrome    Coronary artery disease involving coronary bypass graft of native heart without angina pectoris    Benign essential HTN    DM type 2 with diabetic peripheral neuropathy  (HCC)    Thrombocytopenia (HCC)    Cerebrovascular accident (CVA) due to embolism of right middle cerebral artery (HCC)    CKD (chronic kidney disease) stage 3, GFR 30-59 ml/min (HCC) 03/30/2016   Chronic anticoagulation 03/30/2016   Hyperlipidemia 01/31/2009   ACC/AHA stage C congestive heart failure due to ischemic cardiomyopathy (HCC) 01/31/2009   Chronic systolic heart failure (HCC) 01/31/2009   Atrial fibrillation (HCC) 01/31/2009   CAD (coronary artery disease) 05/12/2008     Subjective:  CC:  Walk in visit for acutely worsening pain following stopping IV antibiotics and switching to PO.   Brief ID Hx:  [name] is a 78 y.o. female with  PMHxT2DM, HFrEF (35-40%), CKD3, HTN, HLD, OSA on CPAP, afib on Eliquis, h/o CVA 2017, CAD s/p CABG 2009, morbid obesity.    Admitted to [facility] on 10/06/2018 with worsening back pain x 4 weeks. MRI findings at the time indicated L2-3 discitis/osteomyelitis with right paraspinal soft tissue inflammation amongst chronic ankylosis and foraminal stenosis. Aspiration of lumbar space revealed actinomyces species. She had 12-18 months before had 4 infected teeth pulled. Completed 6 weeks of IV penicillin - January 7th and transitioned to 500 mg amox QID. Was seen shortly after discharge from SNF in February 2020 with worsened back pain - inflammatory markers nearly normalized but switched to doxycycline for simpler regimen.    HPI:  [name] says "she has had better days."  She tells me she has been struggling with her kidney  function and there is been a lot of changes to her diuretics.  She stopped her Spironolactone alone, torsemide and losartan.  She had repeat blood work yesterday with Dr. Shirlee Latch but is uncertain about the results.  She is worried because she has had significant weight gain 1 to 2 pounds every day for the last few days now.  She tells me she is not having any problem trouble breathing or cough or palpitations  She has  been taking her doxycycline twice daily since her last office visit together.  She has completed nearly 7 months of treatment for actinomyces vertebral osteomyelitis.  She says her back pain and tolerance for standing on her feet long-term is back to baseline.  She is hopeful that her infection has been adequately treated.  Denies any side effects to the doxycycline and has no fevers chills or night sweats.   Review of Systems  Constitutional: Negative for chills, diaphoresis and fever.       Weight gain   Respiratory: Negative for cough.   Cardiovascular: Positive for leg swelling. Negative for chest pain.  Musculoskeletal: Positive for back pain.  Skin: Negative for rash.  Neurological: Negative for tingling, weakness and headaches.    Past Medical History:  Diagnosis Date   ACC/AHA stage C congestive heart failure due to ischemic cardiomyopathy (HCC) 01/31/2009   Qualifier: Diagnosis of  By: Juanda Chance, MD, Johny Chess    ANXIETY 01/31/2009   Qualifier: Diagnosis of  By: Cristela Felt, CNA, Christy     Arthritis    "qwhere" (03/30/2016)   Benign essential HTN    CAD (coronary artery disease) 05/2008   a. s/p CABG in 2009.   Cerebrovascular accident (CVA) due to embolism of right middle cerebral artery (HCC)    Chronic anticoagulation    Chronic kidney disease (CKD), stage III (moderate) (HCC)    Chronic lower back pain    Chronic pain syndrome    Chronic systolic CHF (congestive heart failure) (HCC)    DM type 2 with diabetic peripheral neuropathy (HCC)    Facial weakness, post-stroke    Gastrointestinal hemorrhage 03/26/2017   Hemiparesis (HCC)    History of CVA with residual deficit 03/26/2017   Hyperlipidemia    Hypertension    Ischemic cardiomyopathy    Longstanding persistent atrial fibrillation    a. Postoperative in 2009;  S/P transesophageal echocardiography-guided cardioversion, previously on Amiodarone and Coumadin. b. recurrent in April 2013 -> pt elected  rate control strategy, initially Coumadin -> had stroke with subtherapeutic INR in 03/2016 and transitioned to Eliquis.   Morbid obesity (HCC) 03/26/2017   OSA on CPAP    severe OSA with AHI 34/hr   Persistent atrial fibrillation    Thrombocytopenia (HCC)     Outpatient Medications Prior to Visit  Medication Sig Dispense Refill   acetaminophen (TYLENOL) 650 MG CR tablet Take 650 mg by mouth every 8 (eight) hours as needed for pain.     apixaban (ELIQUIS) 5 MG TABS tablet Take 1 tablet (5 mg total) by mouth 2 (two) times daily. 60 tablet 11   atorvastatin (LIPITOR) 40 MG tablet TAKE 1 TABLET BY MOUTH  DAILY AT 6 PM. 90 tablet 1   carvedilol (COREG) 6.25 MG tablet Take 1 tablet (6.25 mg total) by mouth 2 (two) times daily with a meal. 60 tablet 6   cholecalciferol (VITAMIN D) 1000 UNITS tablet Take 1,000 Units by mouth daily.      doxycycline (VIBRA-TABS) 100 MG tablet Take 1 tablet (  100 mg total) by mouth 2 (two) times daily. 60 tablet 0   ferrous sulfate 325 (65 FE) MG tablet Take 1 tablet (325 mg total) by mouth daily with breakfast. 60 tablet 3   furosemide (LASIX) 20 MG tablet Take 20 mg by mouth daily.     glucose blood (ONE TOUCH ULTRA TEST) test strip Use three times a day 100 each 3   Insulin Glargine (LANTUS SOLOSTAR) 100 UNIT/ML Solostar Pen INJECT 17 UNITS INTO SKIN AT BEDTIME 15 mL 3   Insulin Pen Needle (PEN NEEDLES) 32G X 4 MM MISC 1 each by Does not apply route at bedtime. Use to inject Lantus each night. 90 each 1   Lancets (ONETOUCH DELICA PLUS LANCET33G) MISC 1 application by Does not apply route daily. 100 each 6   nitroGLYCERIN (NITROSTAT) 0.4 MG SL tablet Place 1 tablet (0.4 mg total) under the tongue every 5 (five) minutes as needed for chest pain (up to 3 doses). 15 tablet 5   potassium chloride SA (K-DUR) 20 MEQ tablet Take 2 tablets (40 mEq total) by mouth daily. 60 tablet 11   rOPINIRole (REQUIP) 0.5 MG tablet TAKE 2 TABLETS BY MOUTH ONCE DAILY AT  BEDTIME 60 tablet 0   torsemide (DEMADEX) 20 MG tablet Take 1 tablet (20 mg total) by mouth daily. May take additional 1 tab as needed for swelling/ weight gain 45 tablet 3   vitamin B-12 (CYANOCOBALAMIN) 1000 MCG tablet Take 1 tablet (1,000 mcg total) by mouth daily. 90 tablet 0   Zinc Oxide 40 % PSTE Apply 1 application topically 3 (three) times daily. 113 g 2   No facility-administered medications prior to visit.      Allergies  Allergen Reactions   Benazepril Other (See Comments)    Unknown reaction at age 44-65 - possibly dizziness   Angiotensin Receptor Blockers Other (See Comments)    Hypotension reaction   Fe-Succ-C-Thre-B12-Des Stomach Other (See Comments)    Unknown reaction   Sulfamethoxazole-Trimethoprim Other (See Comments)    Unknown reaction    Social History   Tobacco Use   Smoking status: Never Smoker   Smokeless tobacco: Never Used  Substance Use Topics   Alcohol use: No   Drug use: No    Objective:   There were no vitals filed for this visit. There is no height or weight on file to calculate BMI.  Physical Exam Pulmonary:     Comments: I do not detect any difficulty breathing or cough. Neurological:     Mental Status: She is alert.  Psychiatric:        Mood and Affect: Mood normal.        Thought Content: Thought content normal.        Judgment: Judgment normal.     Lab Results: Lab Results  Component Value Date   WBC 6.0 11/28/2018   HGB 10.1 (L) 11/28/2018   HCT 30.8 (L) 11/28/2018   MCV 96.0 11/28/2018   PLT 145 11/28/2018    Lab Results  Component Value Date   CREATININE 1.86 (H) 06/03/2019   BUN 60 (H) 06/03/2019   NA 141 06/03/2019   K 5.0 06/03/2019   CL 110 06/03/2019   CO2 24 06/03/2019    Lab Results  Component Value Date   ALT 14 03/25/2017   AST 27 03/25/2017   ALKPHOS 99 03/25/2017   BILITOT 0.8 03/25/2017    Sed Rate  Date Value  11/28/2018 38 mm/h (H)  11/19/2018 50 mm/h (H)  10/06/2018 80 mm/hr  (H)   CRP  Date Value  11/28/2018 8.3 mg/L (H)  11/19/2018 11.1 mg/L (H)  10/07/2018 10.2 mg/dL (H)    IMAGING: 41/66/06 IMPRESSION: 1. Abnormal L2-L3 disc, vertebrae, and surrounding soft tissues most compatible with Acute Discitis Osteomyelitis. Confluent edema in both vertebrae and the surrounding paraspinal soft tissues greater on the left. No drainable fluid collection or abscess at this time, although attention directed to the left paraspinal phlegmon on follow-up imaging. Underlying multifactorial moderate to severe spinal, left lateral recess, and left foraminal stenosis.  2. Chronic DISH related ankylosis from the lower thoracic spine to the L2 level. L3 through S1 spinal degeneration with moderate to severe spinal stenosis at L3-L4, moderate to severe right side lateral recess and foraminal stenosis at L4-L5.   Assessment & Plan:   Problem List Items Addressed This Visit      High   Vertebral osteomyelitis (HCC)    Good response with treating her infection both clinically and through lab monitoring.  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  We will have her repeat her blood work next week to see how she has done on 6 months of oral therapy.  If everything looks good we will consider stopping her infection and monitor off antibiotics.  I told her that she will never have no pain and my hope is to reduce it back to very comparably what it was prior to her infection.  She understands and will wait my for my call next week.      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  Clinically it appears she is doing well.      Relevant Orders   C-reactive protein   Sedimentation rate   CBC     Unprioritized   Chronic systolic heart failure (HCC) (Chronic)     She has had weight gain of 1 to 2 pounds per night over the last 2 nights.  I advised her that this is too much and she needs to call the heart failure nurse triage team to discuss her next steps.  Recently had her diuretics held for AKI.      Relevant Medications   furosemide (LASIX) 20 MG tablet     Follow Up Instructions: Return to clinic next week for lab work prior to stopping antibiotics.  I asked her to call the heart failure clinic after she gets off the phone with me to report there is her triage nurse for findings of significant weight gain and swelling.  I discussed the assessment and treatment plan with the patient. The patient was provided an opportunity to ask questions and all were answered. The patient agreed with the plan and demonstrated an understanding of the instructions.   The patient was advised to call back or seek an in-person evaluation if the symptoms worsen or if the condition fails to improve as anticipated.  I provided 15 minutes of non-face-to-face time during this encounter.   Rexene Alberts, MSN, NP-C [facility]  [email or web address] Pager: [phone] Office: [phone] [facility] Main Line: [phone]    06/04/19  4:23 PM

## 2019-06-04 NOTE — Assessment & Plan Note (Signed)
She has had weight gain of 1 to 2 pounds per night over the last 2 nights.  I advised her that this is too much and she needs to call the heart failure nurse triage team to discuss her next steps.  Recently had her diuretics held for AKI.

## 2019-06-05 ENCOUNTER — Telehealth (HOSPITAL_COMMUNITY): Payer: Self-pay

## 2019-06-05 MED ORDER — TORSEMIDE 20 MG PO TABS: 40.0000 mg | ORAL_TABLET | Freq: Every day | ORAL | 3 refills | Status: DC

## 2019-06-05 NOTE — Telephone Encounter (Signed)
Pt called to report continued leg swelling and weight gain, Amy, NP reviewed BMET and advised to increase torsemide to 40 mg daily until f/u with DM next week. Pt verbalized understanding.

## 2019-06-09 ENCOUNTER — Ambulatory Visit (HOSPITAL_COMMUNITY)
Admission: RE | Admit: 2019-06-09 | Discharge: 2019-06-09 | Disposition: A | Discharge status: Home / Self Care | Payer: Medicare Other | Source: Ambulatory Visit | Attending: Cardiology | Admitting: Cardiology

## 2019-06-09 ENCOUNTER — Encounter (HOSPITAL_COMMUNITY): Payer: Self-pay | Admitting: Cardiology

## 2019-06-09 ENCOUNTER — Other Ambulatory Visit: Payer: Self-pay

## 2019-06-09 VITALS — BP 154/92 | HR 79 | Wt 217.6 lb

## 2019-06-09 DIAGNOSIS — Z7901 Long term (current) use of anticoagulants: Secondary | ICD-10-CM | POA: Diagnosis not present

## 2019-06-09 DIAGNOSIS — I130 Hypertensive heart and chronic kidney disease with heart failure and stage 1 through stage 4 chronic kidney disease, or unspecified chronic kidney disease: Secondary | ICD-10-CM | POA: Diagnosis not present

## 2019-06-09 DIAGNOSIS — I255 Ischemic cardiomyopathy: Secondary | ICD-10-CM | POA: Diagnosis not present

## 2019-06-09 DIAGNOSIS — I5022 Chronic systolic (congestive) heart failure: Secondary | ICD-10-CM

## 2019-06-09 DIAGNOSIS — Z801 Family history of malignant neoplasm of trachea, bronchus and lung: Secondary | ICD-10-CM | POA: Diagnosis not present

## 2019-06-09 DIAGNOSIS — E1122 Type 2 diabetes mellitus with diabetic chronic kidney disease: Secondary | ICD-10-CM | POA: Insufficient documentation

## 2019-06-09 DIAGNOSIS — I2510 Atherosclerotic heart disease of native coronary artery without angina pectoris: Secondary | ICD-10-CM | POA: Insufficient documentation

## 2019-06-09 DIAGNOSIS — Z951 Presence of aortocoronary bypass graft: Secondary | ICD-10-CM | POA: Insufficient documentation

## 2019-06-09 DIAGNOSIS — I272 Pulmonary hypertension, unspecified: Secondary | ICD-10-CM | POA: Insufficient documentation

## 2019-06-09 DIAGNOSIS — Z882 Allergy status to sulfonamides status: Secondary | ICD-10-CM | POA: Diagnosis not present

## 2019-06-09 DIAGNOSIS — Z79899 Other long term (current) drug therapy: Secondary | ICD-10-CM | POA: Insufficient documentation

## 2019-06-09 DIAGNOSIS — Z794 Long term (current) use of insulin: Secondary | ICD-10-CM | POA: Insufficient documentation

## 2019-06-09 DIAGNOSIS — N183 Chronic kidney disease, stage 3 (moderate): Secondary | ICD-10-CM | POA: Insufficient documentation

## 2019-06-09 DIAGNOSIS — I4819 Other persistent atrial fibrillation: Secondary | ICD-10-CM

## 2019-06-09 DIAGNOSIS — I482 Chronic atrial fibrillation, unspecified: Secondary | ICD-10-CM | POA: Insufficient documentation

## 2019-06-09 DIAGNOSIS — Z888 Allergy status to other drugs, medicaments and biological substances status: Secondary | ICD-10-CM | POA: Diagnosis not present

## 2019-06-09 DIAGNOSIS — Z825 Family history of asthma and other chronic lower respiratory diseases: Secondary | ICD-10-CM | POA: Insufficient documentation

## 2019-06-09 DIAGNOSIS — Z8673 Personal history of transient ischemic attack (TIA), and cerebral infarction without residual deficits: Secondary | ICD-10-CM | POA: Insufficient documentation

## 2019-06-09 DIAGNOSIS — G4733 Obstructive sleep apnea (adult) (pediatric): Secondary | ICD-10-CM | POA: Insufficient documentation

## 2019-06-09 LAB — CBC
HCT: 35.7 % — ABNORMAL LOW (ref 36.0–46.0)
Hemoglobin: 11.1 g/dL — ABNORMAL LOW (ref 12.0–15.0)
MCH: 32.6 pg (ref 26.0–34.0)
MCHC: 31.1 g/dL (ref 30.0–36.0)
MCV: 105 fL — ABNORMAL HIGH (ref 80.0–100.0)
Platelets: UNDETERMINED 10*3/uL (ref 150–400)
RBC: 3.4 MIL/uL — ABNORMAL LOW (ref 3.87–5.11)
RDW: 13.6 % (ref 11.5–15.5)
WBC: 5.4 10*3/uL (ref 4.0–10.5)
nRBC: 0 % (ref 0.0–0.2)

## 2019-06-09 LAB — BASIC METABOLIC PANEL
Anion gap: 12 (ref 5–15)
BUN: 65 mg/dL — ABNORMAL HIGH (ref 8–23)
CO2: 23 mmol/L (ref 22–32)
Calcium: 9.6 mg/dL (ref 8.9–10.3)
Chloride: 106 mmol/L (ref 98–111)
Creatinine, Ser: 1.91 mg/dL — ABNORMAL HIGH (ref 0.44–1.00)
GFR calc Af Amer: 28 mL/min — ABNORMAL LOW (ref >60)
GFR calc non Af Amer: 24 mL/min — ABNORMAL LOW (ref >60)
Glucose, Bld: 156 mg/dL — ABNORMAL HIGH (ref 70–99)
Potassium: 3.9 mmol/L (ref 3.5–5.1)
Sodium: 141 mmol/L (ref 135–145)

## 2019-06-09 LAB — SEDIMENTATION RATE: Sed Rate: 13 mm/hr (ref 0–22)

## 2019-06-09 LAB — C-REACTIVE PROTEIN: CRP: <0.8 mg/dL (ref <1.0)

## 2019-06-09 MED ORDER — TORSEMIDE 20 MG PO TABS: ORAL_TABLET | ORAL | 6 refills | Status: DC

## 2019-06-09 NOTE — Patient Instructions (Addendum)
INCREASE Torsemide to 40mg  (2 tabs) in the morning AND 20mg  (1 tab) in the evening.   Labs today and repeat in 10 days We will only contact you if something comes back abnormal or we need to make some changes. Otherwise no news is good news!   Your physician has requested that you have an echocardiogram. Echocardiography is a painless test that uses sound waves to create images of your heart. It provides your doctor with information about the size and shape of your heart and how well your heart's chambers and valves are working. This procedure takes approximately one hour. There are no restrictions for this procedure.  Your physician recommends that you schedule a follow-up appointment in: 6 weeks with Dr Aundra Dubin and an ECHO. You will be called to schedule this appointment.   At the [facility], you and your health needs are our priority. As part of our continuing mission to provide you with exceptional heart care, we have created designated Provider Care Teams. These Care Teams include your primary Cardiologist (physician) and Advanced Practice Providers (APPs- Physician Assistants and Nurse Practitioners) who all work together to provide you with the care you need, when you need it.   You may see any of the following providers on your designated Care Team at your next follow up: Marland Kitchen Dr Glori Bickers . Dr Loralie Champagne . Darrick Grinder, NP

## 2019-06-10 ENCOUNTER — Telehealth (HOSPITAL_COMMUNITY): Payer: Self-pay | Admitting: Vascular Surgery

## 2019-06-10 ENCOUNTER — Telehealth: Payer: Self-pay | Admitting: *Deleted

## 2019-06-10 ENCOUNTER — Other Ambulatory Visit: Payer: Medicare Other

## 2019-06-10 NOTE — Telephone Encounter (Signed)
Left pt message giving 6 week f/u w/ echo 9/11 @ 2 and 3 pm, asked pt to call back confirm

## 2019-06-10 NOTE — Telephone Encounter (Signed)
Totally fine - I don't mind her wanting to consolidate her lab sticks.   Will be on the look out for results

## 2019-06-10 NOTE — Progress Notes (Signed)
Advanced Heart Failure Clinic Note   Primary Care: Dr. Shan Levans Primary Cardiologist: Dr. Angelena Form HF Cardiology: Dr. Aundra Dubin  HPI:  [name] is a 78 y.o. female with history of CAD status post CABG 2009, ischemic cardiomyopathy with chronic systolic CHF, persistent atrial fibrillation with stroke in 03/2016 when INR was subtherapeutic, stage III CKD, morbid obesity, pulmonary hypertension, diabetes, GI bleed 03/2017, and severe sleep apnea.   Seen in [facility] clinics 09/10/18 and 09/24/18, both times with volume overload and marked peripheral edema with skin breakdown. Torsemide increased at each visit. Echo in 7/19 showed EF 45-50% with mild RV dilation/mild RV systolic dysfunction and D-shaped interventricular septum.    AKI earlier in 7/20 in setting of excessive torsemide, she had increased dose to 60 qam/40 qpm on her own.  This was cut back to 40 mg daily and losartan and spironolactone were stopped.   She returns today for followup of CHF.  Weight is stable.  Since cutting back torsemide, she feels like her peripheral edema has worsened.  No dyspnea walking on flat ground.  She walks with a walker.  She sleeps in a hospital bed, head of bed has been raised for years.  No chest pain.  No BRBPR/melena. BP is high in the office today but SBP generally in 110s when she checks daily at home.    Labs (12/18): K 3.9, Creatinine 1.65 Labs (5/19): K 4.1, creatinine 1.7 Labs (6/19): hgb 8.2 Labs (8/19): K 4.3, creatinine 1.73 Labs (9/19): K 4, creatinine 1.56 Labs (11/19): K 4.7, creatinine 2.04 Labs (12/19): creatinine 1.44 Labs (7/20): K 5, creatinine 2.49 => 1.86  Review of systems complete and found to be negative unless listed in HPI.    Past Medical History 1. Chronic systolic CHF: Ischemic cardiomyopathy with prominent RV failure.   - Echo (11/18): LVEF 35-40%, severe RV dilation, severe RAE, severe TR.  - Echo (7/19): EF 45-50%, diffuse hypokinesis, mild RV dilation with mildly  decreased RV systolic function, D-shaped interventricular septum suggestive of RV pressure/volume overload, mild MR, moderate TR, PASP 49 mmhg.  2. CAD: CABG 2009. No angiography since that time.   3. HTN 4. Atrial fibrillation: Chronic since 2013.  5. CKD stage 3 6. Morbid obesity 7. OSA 8. Chronic venous stasis with RLE wound/Bullae 9. CVA 5/18.   Current Outpatient Medications  Medication Sig Dispense Refill  . acetaminophen (TYLENOL) 650 MG CR tablet Take 650 mg by mouth every 8 (eight) hours as needed for pain.    Marland Kitchen apixaban (ELIQUIS) 5 MG TABS tablet Take 1 tablet (5 mg total) by mouth 2 (two) times daily. 60 tablet 11  . atorvastatin (LIPITOR) 40 MG tablet TAKE 1 TABLET BY MOUTH  DAILY AT 6 PM. 90 tablet 1  . carvedilol (COREG) 6.25 MG tablet Take 1 tablet (6.25 mg total) by mouth 2 (two) times daily with a meal. 60 tablet 6  . cholecalciferol (VITAMIN D) 1000 UNITS tablet Take 1,000 Units by mouth daily.     Marland Kitchen doxycycline (VIBRA-TABS) 100 MG tablet Take 1 tablet (100 mg total) by mouth 2 (two) times daily. 60 tablet 0  . ferrous sulfate 325 (65 FE) MG tablet Take 1 tablet (325 mg total) by mouth daily with breakfast. 60 tablet 3  . glucose blood (ONE TOUCH ULTRA TEST) test strip Use three times a day 100 each 3  . Insulin Glargine (LANTUS SOLOSTAR) 100 UNIT/ML Solostar Pen INJECT 17 UNITS INTO SKIN AT BEDTIME 15 mL 3  . Insulin  Pen Needle (PEN NEEDLES) 32G X 4 MM MISC 1 each by Does not apply route at bedtime. Use to inject Lantus each night. 90 each 1  . Lancets (ONETOUCH DELICA PLUS TMLYYT03T) MISC 1 application by Does not apply route daily. 100 each 6  . nitroGLYCERIN (NITROSTAT) 0.4 MG SL tablet Place 1 tablet (0.4 mg total) under the tongue every 5 (five) minutes as needed for chest pain (up to 3 doses). 15 tablet 5  . potassium chloride SA (K-DUR) 20 MEQ tablet Take 2 tablets (40 mEq total) by mouth daily. 60 tablet 11  . rOPINIRole (REQUIP) 0.5 MG tablet TAKE 2 TABLETS BY  MOUTH ONCE DAILY AT BEDTIME 60 tablet 0  . torsemide (DEMADEX) 20 MG tablet Take 2 tablets (40 mg total) by mouth every morning AND 1 tablet (20 mg total) every evening. 90 tablet 6  . vitamin B-12 (CYANOCOBALAMIN) 1000 MCG tablet Take 1 tablet (1,000 mcg total) by mouth daily. 90 tablet 0  . Zinc Oxide 40 % PSTE Apply 1 application topically 3 (three) times daily. 113 g 2   No current facility-administered medications for this encounter.    Allergies  Allergen Reactions  . Benazepril Other (See Comments)    Unknown reaction at age 50-65 - possibly dizziness  . Angiotensin Receptor Blockers Other (See Comments)    Hypotension reaction  . Fe-Succ-C-Thre-B12-Des Stomach Other (See Comments)    Unknown reaction  . Sulfamethoxazole-Trimethoprim Other (See Comments)    Unknown reaction   Social History   Socioeconomic History  . Marital status: Widowed    Spouse name: Not on file  . Number of children: Not on file  . Years of education: Not on file  . Highest education level: Not on file  Occupational History  . Not on file  Social Needs  . Financial resource strain: Not on file  . Food insecurity    Worry: Not on file    Inability: Not on file  . Transportation needs    Medical: Not on file    Non-medical: Not on file  Tobacco Use  . Smoking status: Never Smoker  . Smokeless tobacco: Never Used  Substance and Sexual Activity  . Alcohol use: No  . Drug use: No  . Sexual activity: Not Currently  Lifestyle  . Physical activity    Days per week: Not on file    Minutes per session: Not on file  . Stress: Not on file  Relationships  . Social Herbalist on phone: Not on file    Gets together: Not on file    Attends religious service: Not on file    Active member of club or organization: Not on file    Attends meetings of clubs or organizations: Not on file    Relationship status: Not on file  . Intimate partner violence    Fear of current or ex partner: Not on  file    Emotionally abused: Not on file    Physically abused: Not on file    Forced sexual activity: Not on file  Other Topics Concern  . Not on file  Social History Narrative   Widowed   Lives alone in [place]   2 children   Not routinely exercising   Family History  Problem Relation Age of Onset  . Clotting disorder Mother        Cerebral hemorrhage  . Emphysema Father        COD  . Lung cancer Brother   .  Heart disease Neg Hx    Vitals:   06/09/19 1454  BP: (!) 154/92  Pulse: 79  SpO2: 99%  Weight: 98.7 kg (217 lb 9.6 oz)     Wt Readings from Last 3 Encounters:  06/09/19 98.7 kg (217 lb 9.6 oz)  12/26/18 96.4 kg (212 lb 8 oz)  12/16/18 98.6 kg (217 lb 6.4 oz)    PHYSICAL EXAM: General: NAD Neck: JVP 8-9 cm, no thyromegaly or thyroid nodule.  Lungs: Clear to auscultation bilaterally with normal respiratory effort. CV: Nondisplaced PMI.  Heart irregular S1/S2, no S3/S4, no murmur. 1+ ankle edema.  No carotid bruit.  Normal pedal pulses.  Abdomen: Soft, nontender, no hepatosplenomegaly, no distention.  Skin: Intact without lesions or rashes.  Neurologic: Alert and oriented x 3.  Psych: Normal affect. Extremities: No clubbing or cyanosis.  HEENT: Normal.   ASSESSMENT & PLAN:  1. Chronic systolic CHF with prominent RV failure: Ischemic cardiomyopathy.  Echo in 7/19 showed LV EF 45-50% (improved) with mildly dilated/mildly dysfunction RV.  D-shaped septum suggested RV pressure/volume overload.  Weight is stable.  Losartan and spironolactone recently stopped and torsemide decreased with overdiuresis/AKI.  On exam today, she is mildly volume overloaded.  Creatinine had dropped back to 1.86 when last checked.    - Increase torsemide to 40 qam/20 qpm.  BMET today and again in 10 days.  - Continue Coreg 6.25 mg bid for now but will hold off on restarting losartan or spironolactone with creatinine still elevated.   - I will arrange for repeat echo at her followup visit.   2. CAD sp CABG: No chest pain.  - Continue atorvastatin 40 mg daily.  - No ASA with stable CAD on Eliquis.  3. Atrial fibrillation: Chronic. Given long-term atrial fibrillation, she is unlikely to successfully cardiovert.  - Continue Eliquis.   4. CKD stage 3: BMET today.   5. H/o CVA: Continue Eliquis.   6. OSA: Continue CPAP.  7. HTN: BP high in office today but she brings home numbers and SBP generally in 110s.  No changes.   Loralie Champagne 06/10/2019

## 2019-06-10 NOTE — Telephone Encounter (Signed)
Patient called to cancel her lab appointment for today as she had her labs drawn at Dr Oleh Genin office on yesterday. Advised will let her provider know and someone will call her once they have been resulted and reviewed.

## 2019-06-15 ENCOUNTER — Telehealth: Payer: Self-pay | Admitting: *Deleted

## 2019-06-15 NOTE — Telephone Encounter (Signed)
Patient is calling to see if [name] has received lab results and made a decision about her medication yet? Landis Gandy, RN

## 2019-06-15 NOTE — Telephone Encounter (Signed)
I can see them in the system.  Her labs look excellent regarding her infection. No signs of ongoing inflammation. She can stop her doxycycline and follow up with me in 6 weeks if you could help arrange that please.

## 2019-06-18 ENCOUNTER — Ambulatory Visit (HOSPITAL_COMMUNITY)
Admission: RE | Admit: 2019-06-18 | Discharge: 2019-06-18 | Disposition: A | Discharge status: Home / Self Care | Payer: Medicare Other | Source: Ambulatory Visit | Attending: Internal Medicine | Admitting: Internal Medicine

## 2019-06-18 ENCOUNTER — Other Ambulatory Visit: Payer: Self-pay

## 2019-06-18 ENCOUNTER — Other Ambulatory Visit (HOSPITAL_COMMUNITY): Payer: Medicare Other

## 2019-06-18 DIAGNOSIS — I5022 Chronic systolic (congestive) heart failure: Secondary | ICD-10-CM | POA: Diagnosis not present

## 2019-06-18 LAB — BASIC METABOLIC PANEL
Anion gap: 10 (ref 5–15)
BUN: 76 mg/dL — ABNORMAL HIGH (ref 8–23)
CO2: 25 mmol/L (ref 22–32)
Calcium: 9.9 mg/dL (ref 8.9–10.3)
Chloride: 103 mmol/L (ref 98–111)
Creatinine, Ser: 1.99 mg/dL — ABNORMAL HIGH (ref 0.44–1.00)
GFR calc Af Amer: 27 mL/min — ABNORMAL LOW (ref >60)
GFR calc non Af Amer: 23 mL/min — ABNORMAL LOW (ref >60)
Glucose, Bld: 70 mg/dL (ref 70–99)
Potassium: 3.9 mmol/L (ref 3.5–5.1)
Sodium: 138 mmol/L (ref 135–145)

## 2019-06-18 NOTE — Telephone Encounter (Signed)
Relayed message to patient, scheduled follow up for 9/21 at 8:45.  Patient will ask her son to drive her to this appointment. Landis Gandy, RN

## 2019-06-19 NOTE — Telephone Encounter (Signed)
Patient was approved to receive Eliquis through [facility].  Effective Dates: 06/10/2019-11/12/2019. Patient received first shipment, 06/15/2019.  ID: [place], CPhT

## 2019-06-23 ENCOUNTER — Other Ambulatory Visit: Payer: Self-pay | Admitting: *Deleted

## 2019-06-23 MED ORDER — PEN NEEDLES 32G X 4 MM MISC: 1.0000 | Freq: Every day | 12 refills | Status: DC

## 2019-06-24 ENCOUNTER — Encounter: Payer: Self-pay | Admitting: Family Medicine

## 2019-06-24 ENCOUNTER — Telehealth (INDEPENDENT_AMBULATORY_CARE_PROVIDER_SITE_OTHER): Payer: Medicare Other | Admitting: Family Medicine

## 2019-06-24 ENCOUNTER — Other Ambulatory Visit: Payer: Self-pay

## 2019-06-24 DIAGNOSIS — E1142 Type 2 diabetes mellitus with diabetic polyneuropathy: Secondary | ICD-10-CM

## 2019-06-24 MED ORDER — ROPINIROLE HCL 0.5 MG PO TABS: ORAL_TABLET | ORAL | 11 refills | Status: DC

## 2019-06-24 MED ORDER — LANTUS SOLOSTAR 100 UNIT/ML ~~LOC~~ SOPN: 7.0000 [IU] | PEN_INJECTOR | Freq: Every day | SUBCUTANEOUS | 0 refills | Status: DC

## 2019-06-24 NOTE — Assessment & Plan Note (Signed)
Discussed with patient that since her last A1c in January 2020 was 5.9 and she is 78 years old, she would benefit from reducing her Lantus.  Patient has been thinking about this idea and is in agreement with this plan.  We will reduce her Lantus from 17 units nightly to 7 units nightly.  She will take her fasting blood sugars each morning and call me on Monday, August 17 with the results so that we can titrate her Lantus either up or down.  We agreed that it would be nice to be able to stop Lantus altogether, but we will just decrease this for now.

## 2019-06-24 NOTE — Progress Notes (Signed)
[place] Telemedicine Visit  Patient consented to have virtual visit. Method of visit: Telephone  Encounter participants: Patient: [name] - located at home Provider: Kathrene Alu - located at [facility] Others (if applicable): none  Chief Complaint: hypoglycemia  HPI:  Has had two episodes in the morning of CBGs of 58 and 78 Had diaphoresis and nausea when her CBG was 58 but did not have symptoms at 78 Continues to take Lantus 17 units nightly Would like refills ropinirole and Lantus Per Dr. Aundra Dubin, now takes two torsemide tablets in the morning and one in the pm and has stopped spironolactone and losartan, which is working well for her  ROS: per HPI  Pertinent PMHx: Type 2 diabetes, CHF, hypertension, discitis, hyperlipidemia  Exam:  Respiratory: Speaks in full sentences without shortness of breath.  No cough  Assessment/Plan:  DM type 2 with diabetic peripheral neuropathy [facility]) Discussed with patient that since her last A1c in January 2020 was 5.9 and she is 78 years old, she would benefit from reducing her Lantus.  Patient has been thinking about this idea and is in agreement with this plan.  We will reduce her Lantus from 17 units nightly to 7 units nightly.  She will take her fasting blood sugars each morning and call me on Monday, August 17 with the results so that we can titrate her Lantus either up or down.  We agreed that it would be nice to be able to stop Lantus altogether, but we will just decrease this for now.    Time spent during visit with patient: 12 minutes

## 2019-06-29 ENCOUNTER — Other Ambulatory Visit: Payer: Self-pay | Admitting: Family Medicine

## 2019-06-29 ENCOUNTER — Telehealth: Payer: Self-pay

## 2019-06-29 MED ORDER — GLUCOSE BLOOD VI STRP: ORAL_STRIP | 3 refills | Status: DC

## 2019-06-29 NOTE — Telephone Encounter (Signed)
Discussed with patient that I am happy with those blood sugars as long as she feels comfortable with them.  She says that she was comfortable continuing her current dose of Lantus since she only had 2 values greater than 170.  We agreed that if she has the majority of her blood sugars ranging from 1 20-1 70, we will continue the dose as it is, and she will contact me if most of them are greater than 170.  I have also called and her glucose meter test strips at her request.

## 2019-06-29 NOTE — Telephone Encounter (Signed)
Patient calls nurse line stating she was told to report her sugars over the last few days.  129, 140, 178, 170, 143

## 2019-07-03 NOTE — Telephone Encounter (Signed)
Returned Ms Langston's call. We decided to increase her Lantus dose to 12 units daily. She will update me if her sugars are consistently greater than 150 fasting.

## 2019-07-03 NOTE — Telephone Encounter (Signed)
Pt calls to report her sugars.  She feels like maybe her meds need to be increased.  Will forward to PCP  Tuesday - 170 Wednesday - 146 Thursday - 182 Friday - 187  Janett Billow Fleeger, CMA

## 2019-07-04 ENCOUNTER — Other Ambulatory Visit (HOSPITAL_COMMUNITY): Payer: Self-pay | Admitting: Cardiology

## 2019-07-24 ENCOUNTER — Ambulatory Visit (HOSPITAL_BASED_OUTPATIENT_CLINIC_OR_DEPARTMENT_OTHER)
Admission: RE | Admit: 2019-07-24 | Discharge: 2019-07-24 | Disposition: A | Discharge status: Home / Self Care | Payer: Medicare Other | Source: Ambulatory Visit | Attending: Cardiology | Admitting: Cardiology

## 2019-07-24 ENCOUNTER — Other Ambulatory Visit: Payer: Self-pay

## 2019-07-24 ENCOUNTER — Encounter (HOSPITAL_COMMUNITY): Payer: Self-pay | Admitting: Cardiology

## 2019-07-24 ENCOUNTER — Ambulatory Visit (HOSPITAL_COMMUNITY)
Admission: RE | Admit: 2019-07-24 | Discharge: 2019-07-24 | Disposition: A | Discharge status: Home / Self Care | Payer: Medicare Other | Source: Ambulatory Visit | Attending: Cardiology | Admitting: Cardiology

## 2019-07-24 VITALS — BP 148/78 | HR 60 | Wt 233.8 lb

## 2019-07-24 DIAGNOSIS — N183 Chronic kidney disease, stage 3 unspecified: Secondary | ICD-10-CM

## 2019-07-24 DIAGNOSIS — I2510 Atherosclerotic heart disease of native coronary artery without angina pectoris: Secondary | ICD-10-CM | POA: Insufficient documentation

## 2019-07-24 DIAGNOSIS — I5022 Chronic systolic (congestive) heart failure: Secondary | ICD-10-CM

## 2019-07-24 DIAGNOSIS — I272 Pulmonary hypertension, unspecified: Secondary | ICD-10-CM | POA: Diagnosis not present

## 2019-07-24 DIAGNOSIS — Z794 Long term (current) use of insulin: Secondary | ICD-10-CM | POA: Insufficient documentation

## 2019-07-24 DIAGNOSIS — I482 Chronic atrial fibrillation, unspecified: Secondary | ICD-10-CM | POA: Diagnosis not present

## 2019-07-24 DIAGNOSIS — Z8673 Personal history of transient ischemic attack (TIA), and cerebral infarction without residual deficits: Secondary | ICD-10-CM | POA: Diagnosis not present

## 2019-07-24 DIAGNOSIS — Z7901 Long term (current) use of anticoagulants: Secondary | ICD-10-CM | POA: Insufficient documentation

## 2019-07-24 DIAGNOSIS — G4733 Obstructive sleep apnea (adult) (pediatric): Secondary | ICD-10-CM | POA: Insufficient documentation

## 2019-07-24 DIAGNOSIS — E785 Hyperlipidemia, unspecified: Secondary | ICD-10-CM | POA: Diagnosis not present

## 2019-07-24 DIAGNOSIS — I25810 Atherosclerosis of coronary artery bypass graft(s) without angina pectoris: Secondary | ICD-10-CM

## 2019-07-24 DIAGNOSIS — Z951 Presence of aortocoronary bypass graft: Secondary | ICD-10-CM | POA: Diagnosis not present

## 2019-07-24 DIAGNOSIS — I255 Ischemic cardiomyopathy: Secondary | ICD-10-CM | POA: Insufficient documentation

## 2019-07-24 DIAGNOSIS — I080 Rheumatic disorders of both mitral and aortic valves: Secondary | ICD-10-CM | POA: Insufficient documentation

## 2019-07-24 DIAGNOSIS — Z9989 Dependence on other enabling machines and devices: Secondary | ICD-10-CM | POA: Diagnosis not present

## 2019-07-24 DIAGNOSIS — Z79899 Other long term (current) drug therapy: Secondary | ICD-10-CM | POA: Insufficient documentation

## 2019-07-24 DIAGNOSIS — R0602 Shortness of breath: Secondary | ICD-10-CM | POA: Diagnosis not present

## 2019-07-24 DIAGNOSIS — I130 Hypertensive heart and chronic kidney disease with heart failure and stage 1 through stage 4 chronic kidney disease, or unspecified chronic kidney disease: Secondary | ICD-10-CM | POA: Diagnosis not present

## 2019-07-24 DIAGNOSIS — E1122 Type 2 diabetes mellitus with diabetic chronic kidney disease: Secondary | ICD-10-CM | POA: Insufficient documentation

## 2019-07-24 LAB — LIPID PANEL
Cholesterol: 111 mg/dL (ref 0–200)
HDL: 51 mg/dL (ref >40)
LDL Cholesterol: 50 mg/dL (ref 0–99)
Total CHOL/HDL Ratio: 2.2 RATIO
Triglycerides: 49 mg/dL (ref <150)
VLDL: 10 mg/dL (ref 0–40)

## 2019-07-24 LAB — BASIC METABOLIC PANEL
Anion gap: 10 (ref 5–15)
BUN: 53 mg/dL — ABNORMAL HIGH (ref 8–23)
CO2: 27 mmol/L (ref 22–32)
Calcium: 10 mg/dL (ref 8.9–10.3)
Chloride: 104 mmol/L (ref 98–111)
Creatinine, Ser: 1.77 mg/dL — ABNORMAL HIGH (ref 0.44–1.00)
GFR calc Af Amer: 31 mL/min — ABNORMAL LOW (ref >60)
GFR calc non Af Amer: 27 mL/min — ABNORMAL LOW (ref >60)
Glucose, Bld: 108 mg/dL — ABNORMAL HIGH (ref 70–99)
Potassium: 4.1 mmol/L (ref 3.5–5.1)
Sodium: 141 mmol/L (ref 135–145)

## 2019-07-24 MED ORDER — TORSEMIDE 20 MG PO TABS: ORAL_TABLET | ORAL | 5 refills | Status: DC

## 2019-07-24 MED ORDER — POTASSIUM CHLORIDE CRYS ER 20 MEQ PO TBCR: EXTENDED_RELEASE_TABLET | ORAL | 11 refills | Status: DC

## 2019-07-24 NOTE — Progress Notes (Signed)
  Echocardiogram 2D Echocardiogram has been performed.  [name] 07/24/2019, 2:47 PM

## 2019-07-24 NOTE — Patient Instructions (Addendum)
INCREASE Torsemide to 60mg  (3 tabs) in the morning and Torsemide 40mg  (2 tabs) in the evening  INCREASE Potassium to 40 meq (2 tab) in the morning and 20 meq (1 tab) in the evening  You have been referred for CardioMems.  You have been provided a folder with information.  You will get a call to further manage this process.  Labs today and repeat in 1 week. We will only contact you if something comes back abnormal or we need to make some changes. Otherwise no news is good news!  Your physician recommends that you schedule a follow-up appointment in: 3 weeks with Nurse Practitioner  At the [facility], you and your health needs are our priority. As part of our continuing mission to provide you with exceptional heart care, we have created designated Provider Care Teams. These Care Teams include your primary Cardiologist (physician) and Advanced Practice Providers (APPs- Physician Assistants and Nurse Practitioners) who all work together to provide you with the care you need, when you need it.   You may see any of the following providers on your designated Care Team at your next follow up: Marland Kitchen Dr Glori Bickers . Dr Loralie Champagne . Darrick Grinder, NP   Please be sure to bring in all your medications bottles to every appointment.

## 2019-07-26 NOTE — Progress Notes (Signed)
Advanced Heart Failure Clinic Note   Primary Care: Dr. Shan Levans Primary Cardiologist: Dr. Angelena Form HF Cardiology: Dr. Aundra Dubin  HPI:  [name] is a 78 y.o. female with history of CAD status post CABG 2009, ischemic cardiomyopathy with chronic systolic CHF, persistent atrial fibrillation with stroke in 03/2016 when INR was subtherapeutic, stage III CKD, morbid obesity, pulmonary hypertension, diabetes, GI bleed 03/2017, and severe sleep apnea.   Seen in [facility] clinics 09/10/18 and 09/24/18, both times with volume overload and marked peripheral edema with skin breakdown. Torsemide increased at each visit. Echo in 7/19 showed EF 45-50% with mild RV dilation/mild RV systolic dysfunction and D-shaped interventricular septum.    AKI earlier in 7/20 in setting of excessive torsemide, she had increased dose to 60 qam/40 qpm on her own.  This was cut back to 40 mg daily and losartan and spironolactone were stopped.   Echo was done today and reviewed. EF 45% with mildly decreased RV systolic function, PASP 46 mmHg with dilated IVC.   She returns today for followup of CHF.  Weight is up 16 lbs.  She is currently taking torsemide 40 qam/20 qpm.  She uses a walker and is short of breath walking 50-75 yards.  Increased edema recently.  No chest pain.  No lightheadedness.  No orthopnea/PND.      Labs (12/18): K 3.9, Creatinine 1.65 Labs (5/19): K 4.1, creatinine 1.7 Labs (6/19): hgb 8.2 Labs (8/19): K 4.3, creatinine 1.73 Labs (9/19): K 4, creatinine 1.56 Labs (11/19): K 4.7, creatinine 2.04 Labs (12/19): creatinine 1.44 Labs (7/20): K 5, creatinine 2.49 => 1.86, Hgb 11.1 Labs (8/20): K 3.9, creatinine 1.79  Review of systems complete and found to be negative unless listed in HPI.    Past Medical History 1. Chronic systolic CHF: Ischemic cardiomyopathy with prominent RV failure.   - Echo (11/18): LVEF 35-40%, severe RV dilation, severe RAE, severe TR.  - Echo (7/19): EF 45-50%, diffuse  hypokinesis, mild RV dilation with mildly decreased RV systolic function, D-shaped interventricular septum suggestive of RV pressure/volume overload, mild MR, moderate TR, PASP 49 mmhg.  - Echo (9/20): EF 45%, mild LV dilation, mild RV dilation with mildly decreased systolic function, PASP 46 mmHg, IVC dilated.  2. CAD: CABG 2009. No angiography since that time.   3. HTN 4. Atrial fibrillation: Chronic since 2013.  5. CKD stage 3 6. Morbid obesity 7. OSA 8. Chronic venous stasis with RLE wound/Bullae 9. CVA 5/18.   Current Outpatient Medications  Medication Sig Dispense Refill  . acetaminophen (TYLENOL) 650 MG CR tablet Take 650 mg by mouth every 8 (eight) hours as needed for pain.    Marland Kitchen apixaban (ELIQUIS) 5 MG TABS tablet Take 1 tablet (5 mg total) by mouth 2 (two) times daily. 60 tablet 11  . atorvastatin (LIPITOR) 40 MG tablet TAKE 1 TABLET BY MOUTH  DAILY AT 6 PM. 90 tablet 1  . carvedilol (COREG) 6.25 MG tablet Take 1 tablet (6.25 mg total) by mouth 2 (two) times daily with a meal. 60 tablet 6  . cholecalciferol (VITAMIN D) 1000 UNITS tablet Take 1,000 Units by mouth daily.     Marland Kitchen doxycycline (VIBRA-TABS) 100 MG tablet Take 1 tablet (100 mg total) by mouth 2 (two) times daily. 60 tablet 0  . ferrous sulfate 325 (65 FE) MG tablet Take 1 tablet (325 mg total) by mouth daily with breakfast. 60 tablet 3  . glucose blood (ONE TOUCH ULTRA TEST) test strip Use three times a day  100 each 3  . Insulin Glargine (LANTUS SOLOSTAR) 100 UNIT/ML Solostar Pen Inject 7 Units into the skin at bedtime. INJECT 7 UNITS INTO SKIN AT BEDTIME 15 mL 0  . Insulin Pen Needle (PEN NEEDLES) 32G X 4 MM MISC 1 each by Does not apply route at bedtime. Use to inject Lantus each night. 90 each 12  . Lancets (ONETOUCH DELICA PLUS [id number]) MISC 1 application by Does not apply route daily. 100 each 6  . nitroGLYCERIN (NITROSTAT) 0.4 MG SL tablet Place 1 tablet (0.4 mg total) under the tongue every 5 (five) minutes as needed  for chest pain (up to 3 doses). 15 tablet 5  . potassium chloride SA (K-DUR) 20 MEQ tablet Take 2 tablets (40 mEq total) by mouth every morning AND 1 tablet (20 mEq total) every evening. 60 tablet 11  . rOPINIRole (REQUIP) 0.5 MG tablet TAKE 2 TABLETS BY MOUTH ONCE DAILY AT BEDTIME 60 tablet 11  . torsemide (DEMADEX) 20 MG tablet Take 3 tablets (60 mg total) by mouth every morning AND 2 tablets (40 mg total) every evening. 450 tablet 5  . vitamin B-12 (CYANOCOBALAMIN) 1000 MCG tablet Take 1 tablet (1,000 mcg total) by mouth daily. 90 tablet 0   No current facility-administered medications for this encounter.    Allergies  Allergen Reactions  . Benazepril Other (See Comments)    Unknown reaction at age 70-65 - possibly dizziness  . Angiotensin Receptor Blockers Other (See Comments)    Hypotension reaction  . Fe-Succ-C-Thre-B12-Des Stomach Other (See Comments)    Unknown reaction  . Sulfamethoxazole-Trimethoprim Other (See Comments)    Unknown reaction   Social History   Socioeconomic History  . Marital status: Widowed    Spouse name: Not on file  . Number of children: Not on file  . Years of education: Not on file  . Highest education level: Not on file  Occupational History  . Not on file  Social Needs  . Financial resource strain: Not on file  . Food insecurity    Worry: Not on file    Inability: Not on file  . Transportation needs    Medical: Not on file    Non-medical: Not on file  Tobacco Use  . Smoking status: Never Smoker  . Smokeless tobacco: Never Used  Substance and Sexual Activity  . Alcohol use: No  . Drug use: No  . Sexual activity: Not Currently  Lifestyle  . Physical activity    Days per week: Not on file    Minutes per session: Not on file  . Stress: Not on file  Relationships  . Social Herbalist on phone: Not on file    Gets together: Not on file    Attends religious service: Not on file    Active member of club or organization: Not on  file    Attends meetings of clubs or organizations: Not on file    Relationship status: Not on file  . Intimate partner violence    Fear of current or ex partner: Not on file    Emotionally abused: Not on file    Physically abused: Not on file    Forced sexual activity: Not on file  Other Topics Concern  . Not on file  Social History Narrative   Widowed   Lives alone in [place]   2 children   Not routinely exercising   Family History  Problem Relation Age of Onset  . Clotting disorder Mother  Cerebral hemorrhage  . Emphysema Father        COD  . Lung cancer Brother   . Heart disease Neg Hx    Vitals:   07/24/19 1440  BP: (!) 148/78  Pulse: 60  SpO2: 99%  Weight: 106.1 kg (233 lb 12.8 oz)     Wt Readings from Last 3 Encounters:  07/24/19 106.1 kg (233 lb 12.8 oz)  06/24/19 99.3 kg (219 lb)  06/09/19 98.7 kg (217 lb 9.6 oz)    PHYSICAL EXAM: General: NAD Neck: JVP 10-11, no thyromegaly or thyroid nodule.  Lungs: Clear to auscultation bilaterally with normal respiratory effort. CV: Nondisplaced PMI.  Heart irregular S1/S2, no S3/S4, no murmur. 1+ ankle edema.  No carotid bruit.  Normal pedal pulses.  Abdomen: Soft, nontender, no hepatosplenomegaly, no distention.  Skin: Intact without lesions or rashes.  Neurologic: Alert and oriented x 3.  Psych: Normal affect. Extremities: No clubbing or cyanosis.  HEENT: Normal.   ASSESSMENT & PLAN:  1. Chronic systolic CHF with prominent RV failure: Ischemic cardiomyopathy.  Echo in 7/19 showed LV EF 45-50% (improved) with mildly dilated/mildly dysfunction RV.  D-shaped septum suggested RV pressure/volume overload.  Echo was done today and showed EF 45%, mildly dysfunctional RV, PASP 46 mmHg with dilated IVC.  Weight is up considerably, she is volume overloaded on exam. NYHA class III symptoms.    - Increase torsemide to 60 qam/40 qpm.  Increase KCl to 40 qam/20 qpm.  BMET today and again in 1 week. - Continue Coreg 6.25  mg bid.   - She is not on ACEI/ARB/spironolactone with elevated creatinine.  - She would likely benefit from Cardiomems, will see if we can set her up for this.  2. CAD s/p CABG: No chest pain.  - Continue atorvastatin 40 mg daily. Check lipids today.   - No ASA with stable CAD on Eliquis.  3. Atrial fibrillation: Chronic. Given long-term atrial fibrillation, she is unlikely to successfully cardiovert.  - Continue Eliquis.   4. CKD stage 3: BMET today.   5. H/o CVA: Continue Eliquis.   6. OSA: Continue CPAP.  7. HTN: BP generally controlled at home.   Followup 3 wks with NP to reassess volume.   Loralie Champagne 07/26/2019

## 2019-07-27 ENCOUNTER — Ambulatory Visit (INDEPENDENT_AMBULATORY_CARE_PROVIDER_SITE_OTHER): Payer: Medicare Other | Admitting: *Deleted

## 2019-07-27 ENCOUNTER — Other Ambulatory Visit: Payer: Self-pay

## 2019-07-27 DIAGNOSIS — Z23 Encounter for immunization: Secondary | ICD-10-CM | POA: Diagnosis not present

## 2019-07-31 ENCOUNTER — Ambulatory Visit (HOSPITAL_COMMUNITY)
Admission: RE | Admit: 2019-07-31 | Discharge: 2019-07-31 | Disposition: A | Discharge status: Home / Self Care | Payer: Medicare Other | Source: Ambulatory Visit | Attending: Internal Medicine | Admitting: Internal Medicine

## 2019-07-31 ENCOUNTER — Other Ambulatory Visit: Payer: Self-pay

## 2019-07-31 DIAGNOSIS — I5022 Chronic systolic (congestive) heart failure: Secondary | ICD-10-CM | POA: Diagnosis not present

## 2019-07-31 LAB — BASIC METABOLIC PANEL
Anion gap: 9 (ref 5–15)
BUN: 57 mg/dL — ABNORMAL HIGH (ref 8–23)
CO2: 27 mmol/L (ref 22–32)
Calcium: 10.3 mg/dL (ref 8.9–10.3)
Chloride: 103 mmol/L (ref 98–111)
Creatinine, Ser: 1.84 mg/dL — ABNORMAL HIGH (ref 0.44–1.00)
GFR calc Af Amer: 29 mL/min — ABNORMAL LOW (ref >60)
GFR calc non Af Amer: 25 mL/min — ABNORMAL LOW (ref >60)
Glucose, Bld: 119 mg/dL — ABNORMAL HIGH (ref 70–99)
Potassium: 4.2 mmol/L (ref 3.5–5.1)
Sodium: 139 mmol/L (ref 135–145)

## 2019-08-03 ENCOUNTER — Other Ambulatory Visit: Payer: Self-pay

## 2019-08-03 ENCOUNTER — Encounter: Payer: Self-pay | Admitting: Infectious Diseases

## 2019-08-03 ENCOUNTER — Ambulatory Visit (INDEPENDENT_AMBULATORY_CARE_PROVIDER_SITE_OTHER): Payer: Medicare Other | Admitting: Infectious Diseases

## 2019-08-03 ENCOUNTER — Telehealth (HOSPITAL_COMMUNITY): Payer: Self-pay | Admitting: Cardiology

## 2019-08-03 VITALS — BP 134/71 | HR 76

## 2019-08-03 DIAGNOSIS — A429 Actinomycosis, unspecified: Secondary | ICD-10-CM | POA: Diagnosis not present

## 2019-08-03 DIAGNOSIS — M4620 Osteomyelitis of vertebra, site unspecified: Secondary | ICD-10-CM

## 2019-08-03 NOTE — Patient Instructions (Signed)
I am encouraged that you have been completely cured of your infection.   I will ask Dr. Claris Gladden team to please check some blood work for me the next time he needs you back.   I would like to schedule a telephone visit again in 3 months to check in one more time to ensure you are still doing well off antibiotics and ensure you are cured.   Please call the clinic if you experience worsening back pain or leg weakness to be seen sooner.

## 2019-08-03 NOTE — Assessment & Plan Note (Signed)
She has done well preliminarily since stopping her doxycycline 6 weeks ago. She has no vertebral tenderness on exam and no increased pain complaints/leg weakness.   We will arrange a repeat CRP/ESR sometime prior to her next OV with me if Dr. Claris Gladden visit can facilitate since she gets lab sticks there often to monitor her diuretic therapy.   Telephone visit in 3 months to check in again one more time to ensure no worsening findings.

## 2019-08-03 NOTE — Telephone Encounter (Signed)
Patient referred for cardioMems implant work up Unable to proceed patient has not had a hospitalization within the past 12 months

## 2019-08-03 NOTE — Progress Notes (Signed)
Patient: [name]  DOB: 05/14/1941 MRN: [id number] PCP: Kathrene Alu, MD    Patient Active Problem List   Diagnosis Date Noted  . 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 Vertebral osteomyelitis ([place]) 10/06/2018    Priority: High  . Decubitus ulcer of coccyx, stage 2 ([place]) 10/13/2018  . CHF (congestive heart failure) ([place])   . Discitis   . Foot deformity 08/05/2018  . Intertrigo 08/05/2018  . Iron deficiency anemia 05/19/2018  . Cloudy urine 04/03/2018  . Dysuria 11/26/2017  . OSA on CPAP   . Hypokalemia 11/20/2017  . Longstanding persistent atrial fibrillation   . Ischemic cardiomyopathy   . Hypertension   . Hemiparesis ([place])   . Facial weakness, post-stroke   . Chronic systolic CHF (congestive heart failure) ([place])   . Chronic lower back pain   . Arthritis   . Venous stasis 09/13/2017  . Morbid obesity with BMI of 40.0-44.9, adult ([place]) 08/14/2017  . Bilateral leg edema 07/18/2017  . Blood loss anemia 04/09/2017  . History of CVA with residual deficit 03/26/2017  . Pulmonary hypertension ([place]) 03/26/2017  . Morbid obesity ([place]) 03/26/2017  . Gastrointestinal hemorrhage 03/26/2017  . Rectal bleeding   . Chronic pain syndrome   . Coronary artery disease involving coronary bypass graft of native heart without angina pectoris   . Benign essential HTN   . DM type 2 with diabetic peripheral neuropathy ([place])   . Thrombocytopenia ([place])   . Cerebrovascular accident (CVA) due to embolism of right middle cerebral artery ([place])   . CKD (chronic kidney disease) stage 3, GFR 30-59 ml/min (HCC) 03/30/2016  . Chronic anticoagulation 03/30/2016  . Hyperlipidemia 01/31/2009  . ACC/AHA stage C congestive heart failure due to ischemic cardiomyopathy ([place]) 01/31/2009  . Chronic systolic heart failure ([place]) 01/31/2009  . Atrial fibrillation ([place]) 01/31/2009  . CAD (coronary artery disease) 05/12/2008     Subjective:  CC:  Discitis  follow up. Off antibiotics x 6 weeks.   Brief ID Hx:  [name] is a 78 y.o. female with  PMHxT2DM, HFrEF (35-40%), CKD3, HTN, HLD, OSA on CPAP, afib on Eliquis, h/o CVA 2017, CAD s/p CABG 2009, morbid obesity.    Ad[facility] to [facility] on 10/[facility]2018 with worsening back pain x 4 weeks. MRI findings at the time indicated L2-3 discitis/osteomyelitis with right paraspinal soft tissue inflammation amongst chronic ankylosis and foraminal stenosis. Aspiration of lumbar space revealed actinomyces species. She had 12-18 months before had 4 infected teeth pulled. Completed 6 weeks of IV penicillin - January 7th and transitioned to 500 mg amox QID. Was seen shortly after discharge from SNF in February 2020 with worsened back pain - inflammatory markers nearly normalized but switched to doxycycline for simpler regimen.    HPI:  Since stopping antibiotics she has done well. No worsening back pain or leg weakness. She continues to walk with a Rolator and reports her pain is back to preinfection baseline. Her son is with her today and agrees that she has improved significantly and pleased with result. She has been working with Dr. Claris Gladden team recently about adjusting her fluid pills and kidney levels.    Review of Systems  Constitutional: Negative for chills, fever, malaise/fatigue and weight loss.  HENT: Negative for sore throat.        No dental problems  Respiratory: Negative for cough and sputum production.   Cardiovascular: Negative for chest pain and leg swelling (  improved).  Gastrointestinal: Negative for abdominal pain, diarrhea and vomiting.  Genitourinary: Negative for dysuria and flank pain.  Musculoskeletal: Positive for back pain (baseline). Negative for joint pain, myalgias and neck pain.  Skin: Negative for rash.  Neurological: Negative for dizziness (occasionally ), tingling and headaches.  Psychiatric/Behavioral: Negative for depression and substance abuse. The patient is not  nervous/anxious and does not have insomnia.     Past Medical History:  Diagnosis Date  . ACC/AHA stage C congestive heart failure due to ischemic cardiomyopathy ([place]) 01/31/2009   Qualifier: Diagnosis of  By: Olevia Perches, MD, Glenetta Hew   . ANXIETY 01/31/2009   Qualifier: Diagnosis of  By: Lovette Cliche, CNA, Christy    . Arthritis    "qwhere" (03/30/2016)  . Benign essential HTN   . CAD (coronary artery disease) 05/2008   a. s/p CABG in 2009.  Marland Kitchen Cerebrovascular accident (CVA) due to embolism of right middle cerebral artery ([place])   . Chronic anticoagulation   . Chronic kidney disease (CKD), stage III (moderate) (HCC)   . Chronic lower back pain   . Chronic pain syndrome   . Chronic systolic CHF (congestive heart failure) ([place])   . DM type 2 with diabetic peripheral neuropathy ([place])   . Facial weakness, post-stroke   . Gastrointestinal hemorrhage 03/26/2017  . Hemiparesis ([place])   . History of CVA with residual deficit 03/26/2017  . Hyperlipidemia   . Hypertension   . Ischemic cardiomyopathy   . Longstanding persistent atrial fibrillation    a. Postoperative in 2009;  S/P transesophageal echocardiography-guided cardioversion, previously on Amiodarone and Coumadin. b. recurrent in April 2013 -> pt elected rate control strategy, initially Coumadin -> had stroke with subtherapeutic INR in 03/2016 and transitioned to Eliquis.  . Morbid obesity ([place]) 03/26/2017  . OSA on CPAP    severe OSA with AHI 34/hr  . Persistent atrial fibrillation   . Thrombocytopenia ([place])     Outpatient Medications Prior to Visit  Medication Sig Dispense Refill  . acetaminophen (TYLENOL) 650 MG CR tablet Take 650 mg by mouth every 8 (eight) hours as needed for pain.    Marland Kitchen apixaban (ELIQUIS) 5 MG TABS tablet Take 1 tablet (5 mg total) by mouth 2 (two) times daily. 60 tablet 11  . atorvastatin (LIPITOR) 40 MG tablet TAKE 1 TABLET BY MOUTH  DAILY AT 6 PM. 90 tablet 1  . carvedilol (COREG) 6.25 MG tablet Take 1 tablet (6.25  mg total) by mouth 2 (two) times daily with a meal. 60 tablet 6  . cholecalciferol (VITAMIN D) 1000 UNITS tablet Take 1,000 Units by mouth daily.     . ferrous sulfate 325 (65 FE) MG tablet Take 1 tablet (325 mg total) by mouth daily with breakfast. 60 tablet 3  . glucose blood (ONE TOUCH ULTRA TEST) test strip Use three times a day 100 each 3  . Insulin Glargine (LANTUS SOLOSTAR) 100 UNIT/ML Solostar Pen Inject 7 Units into the skin at bedtime. INJECT 7 UNITS INTO SKIN AT BEDTIME 15 mL 0  . Insulin Pen Needle (PEN NEEDLES) 32G X 4 MM MISC 1 each by Does not apply route at bedtime. Use to inject Lantus each night. 90 each 12  . Lancets (ONETOUCH DELICA PLUS [id number]) MISC 1 application by Does not apply route daily. 100 each 6  . nitroGLYCERIN (NITROSTAT) 0.4 MG SL tablet Place 1 tablet (0.4 mg total) under the tongue every 5 (five) minutes as needed for chest pain (up to 3 doses). 15  tablet 5  . potassium chloride SA (K-DUR) 20 MEQ tablet Take 2 tablets (40 mEq total) by mouth every morning AND 1 tablet (20 mEq total) every evening. 60 tablet 11  . rOPINIRole (REQUIP) 0.5 MG tablet TAKE 2 TABLETS BY MOUTH ONCE DAILY AT BEDTIME 60 tablet 11  . torsemide (DEMADEX) 20 MG tablet Take 3 tablets (60 mg total) by mouth every morning AND 2 tablets (40 mg total) every evening. 450 tablet 5  . vitamin B-12 (CYANOCOBALAMIN) 1000 MCG tablet Take 1 tablet (1,000 mcg total) by mouth daily. 90 tablet 0  . doxycycline (VIBRA-TABS) 100 MG tablet Take 1 tablet (100 mg total) by mouth 2 (two) times daily. (Patient not taking: Reported on 08/03/2019) 60 tablet 0   No facility-administered medications prior to visit.      Allergies  Allergen Reactions  . Benazepril Other (See Comments)    Unknown reaction at age 53-65 - possibly dizziness  . Angiotensin Receptor Blockers Other (See Comments)    Hypotension reaction  . Fe-Succ-C-Thre-B12-Des Stomach Other (See Comments)    Unknown reaction  .  Sulfamethoxazole-Trimethoprim Other (See Comments)    Unknown reaction    Social History   Tobacco Use  . Smoking status: Never Smoker  . Smokeless tobacco: Never Used  Substance Use Topics  . Alcohol use: No  . Drug use: No    Objective:   Vitals:   08/03/19 0851  BP: 134/71  Pulse: 76   There is no height or weight on file to calculate BMI.  Physical Exam Constitutional:      Appearance: Normal appearance.     Comments: Resting comfortably in chair with Rolator walker. Son present.   Cardiovascular:     Rate and Rhythm: Normal rate.     Heart sounds: No murmur.  Pulmonary:     Effort: Pulmonary effort is normal.     Breath sounds: Normal breath sounds.  Musculoskeletal: Normal range of motion.     Comments: No vertebral tenderness.   Skin:    General: Skin is warm and dry.     Capillary Refill: Capillary refill takes less than 2 seconds.  Neurological:     Mental Status: She is alert and oriented to person, place, and time.     Lab Results: Lab Results  Component Value Date   WBC 5.4 06/09/2019   HGB 11.1 (L) 06/09/2019   HCT 35.7 (L) 06/09/2019   MCV 105.0 (H) 06/09/2019   PLT PLATELET CLUMPS NOTED ON SMEAR, UNABLE TO ESTIMATE 06/09/2019    Lab Results  Component Value Date   CREATININE 1.84 (H) 07/31/2019   BUN 57 (H) 07/31/2019   NA 139 07/31/2019   K 4.2 07/31/2019   CL 103 07/31/2019   CO2 27 07/31/2019    Lab Results  Component Value Date   ALT 14 03/25/2017   AST 27 03/25/2017   ALKPHOS 99 03/25/2017   BILITOT 0.8 03/25/2017    Sed Rate  Date Value  06/09/2019 13 mm/hr  11/28/2018 38 mm/h (H)  11/19/2018 50 mm/h (H)   CRP  Date Value  06/09/2019 <0.8 mg/dL  11/28/2018 8.3 mg/L (H)  11/19/2018 11.1 mg/L (H)    IMAGING: 10/06/18 IMPRESSION: 1. Abnormal L2-L3 disc, vertebrae, and surrounding soft tissues most compatible with Acute Discitis Osteomyelitis. Confluent edema in both vertebrae and the surrounding paraspinal soft  tissues greater on the left. No drainable fluid collection or abscess at this time, although attention directed to the left paraspinal phlegmon on  follow-up imaging. Underlying multifactorial moderate to severe spinal, left lateral recess, and left foraminal stenosis.  2. Chronic DISH related ankylosis from the lower thoracic spine to the L2 level. L3 through S1 spinal degeneration with moderate to severe spinal stenosis at L3-L4, moderate to severe right side lateral recess and foraminal stenosis at L4-L5.   Assessment & Plan:   Problem List Items Addressed This Visit      High   Vertebral osteomyelitis ([place])    She has done well preliminarily since stopping her doxycycline 6 weeks ago. She has no vertebral tenderness on exam and no increased pain complaints/leg weakness.   We will arrange a repeat CRP/ESR sometime prior to her next OV with me if Dr. Claris Gladden visit can facilitate since she gets lab sticks there often to monitor her diuretic therapy.   Telephone visit in 3 months to check in again one more time to ensure no worsening findings.       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

## 2019-08-10 ENCOUNTER — Other Ambulatory Visit: Payer: Self-pay | Admitting: Family Medicine

## 2019-08-10 DIAGNOSIS — H353221 Exudative age-related macular degeneration, left eye, with active choroidal neovascularization: Secondary | ICD-10-CM | POA: Diagnosis not present

## 2019-08-10 DIAGNOSIS — H43822 Vitreomacular adhesion, left eye: Secondary | ICD-10-CM | POA: Diagnosis not present

## 2019-08-10 DIAGNOSIS — H33102 Unspecified retinoschisis, left eye: Secondary | ICD-10-CM | POA: Diagnosis not present

## 2019-08-10 DIAGNOSIS — H353222 Exudative age-related macular degeneration, left eye, with inactive choroidal neovascularization: Secondary | ICD-10-CM | POA: Diagnosis not present

## 2019-08-10 LAB — HM DIABETES EYE EXAM

## 2019-08-11 ENCOUNTER — Other Ambulatory Visit: Payer: Self-pay

## 2019-08-11 NOTE — Patient Outreach (Signed)
[facility]) Care Management  08/11/2019  [name] October 31, 1941 [id number]   Medication Adherence call to [name] [place] Identifiers Verify spoke with patient she is past due on Lisinopril 25 mg patient explain she is no longer taken this medication she was taken off by the doctor. [name] is showing past due under [place].   Welcome Management Direct Dial [phone]  Fax [phone] [email or web address]

## 2019-08-13 NOTE — Progress Notes (Signed)
Advanced Heart Failure Clinic Note   Primary Care: Dr. Shan Levans Primary Cardiologist: Dr. Angelena Form HF Cardiology: Dr. Aundra Dubin  HPI: [name] is a 78 y.o. female with history of CAD status post CABG 2009, ischemic cardiomyopathy with chronic systolic CHF, persistent atrial fibrillation with stroke in 03/2016 when INR was subtherapeutic, stage III CKD, morbid obesity, pulmonary hypertension, diabetes, GI bleed 03/2017, and severe sleep apnea.   Seen in [facility] clinics 09/10/18 and 09/24/18, both times with volume overload and marked peripheral edema with skin breakdown. Torsemide increased at each visit. Echo in 7/19 showed EF 45-50% with mild RV dilation/mild RV systolic dysfunction and D-shaped interventricular septum.    AKI earlier in 7/20 in setting of excessive torsemide, she had increased dose to 60 qam/40 qpm on her own.  This was cut back to 40 mg daily and losartan and spironolactone were stopped.   Echo was done today and reviewed. EF 45% with mildly decreased RV systolic function, PASP 46 mmHg with dilated IVC.   Today she returns for HF follow up. Last visit torsemide was increased to 60 mg/40 mg. Weight had gone down from 228--->218 but has been trending back up to 226 pounds. Overall feeling fine. Drinking lots of fluids and eating ice. Mild dyspnea with exertion.  Denies PND/Orthopnea. Walking some in the house with a rolling walker. Appetite ok. No fever or chills.  Taking all medications. Lives with her son.   Labs (12/18): K 3.9, Creatinine 1.65 Labs (5/19): K 4.1, creatinine 1.7 Labs (6/19): hgb 8.2 Labs (8/19): K 4.3, creatinine 1.73 Labs (9/19): K 4, creatinine 1.56 Labs (11/19): K 4.7, creatinine 2.04 Labs (12/19): creatinine 1.44 Labs (7/20): K 5, creatinine 2.49 => 1.86, Hgb 11.1 Labs (8/20): K 3.9, creatinine 1.79  Review of systems complete and found to be negative unless listed in HPI.    Past Medical History 1. Chronic systolic CHF: Ischemic cardiomyopathy  with prominent RV failure.   - Echo (11/18): LVEF 35-40%, severe RV dilation, severe RAE, severe TR.  - Echo (7/19): EF 45-50%, diffuse hypokinesis, mild RV dilation with mildly decreased RV systolic function, D-shaped interventricular septum suggestive of RV pressure/volume overload, mild MR, moderate TR, PASP 49 mmhg.  - Echo (9/20): EF 45%, mild LV dilation, mild RV dilation with mildly decreased systolic function, PASP 46 mmHg, IVC dilated.  2. CAD: CABG 2009. No angiography since that time.   3. HTN 4. Atrial fibrillation: Chronic since 2013.  5. CKD stage 3 6. Morbid obesity 7. OSA 8. Chronic venous stasis with RLE wound/[place] 9. CVA 5/18.   Current Outpatient Medications  Medication Sig Dispense Refill  . acetaminophen (TYLENOL) 650 MG CR tablet Take 650 mg by mouth every 8 (eight) hours as needed for pain.    Marland Kitchen apixaban (ELIQUIS) 5 MG TABS tablet Take 1 tablet (5 mg total) by mouth 2 (two) times daily. 60 tablet 11  . atorvastatin (LIPITOR) 40 MG tablet TAKE 1 TABLET BY MOUTH  DAILY AT 6 PM. 90 tablet 3  . carvedilol (COREG) 6.25 MG tablet Take 1 tablet (6.25 mg total) by mouth 2 (two) times daily with a meal. 60 tablet 6  . cholecalciferol (VITAMIN D) 1000 UNITS tablet Take 1,000 Units by mouth daily.     . ferrous sulfate 325 (65 FE) MG tablet Take 1 tablet (325 mg total) by mouth daily with breakfast. 60 tablet 3  . glucose blood (ONE TOUCH ULTRA TEST) test strip Use three times a day 100 each 3  .  insulin glargine (LANTUS) 100 UNIT/ML injection Inject 12 Units into the skin at bedtime.    . Insulin Pen Needle (PEN NEEDLES) 32G X 4 MM MISC 1 each by Does not apply route at bedtime. Use to inject Lantus each night. 90 each 12  . Lancets (ONETOUCH DELICA PLUS [id number]) MISC 1 application by Does not apply route daily. 100 each 6  . nitroGLYCERIN (NITROSTAT) 0.4 MG SL tablet Place 1 tablet (0.4 mg total) under the tongue every 5 (five) minutes as needed for chest pain (up to 3  doses). 15 tablet 5  . potassium chloride SA (K-DUR) 20 MEQ tablet Take 2 tablets (40 mEq total) by mouth every morning AND 1 tablet (20 mEq total) every evening. 60 tablet 11  . rOPINIRole (REQUIP) 0.5 MG tablet TAKE 2 TABLETS BY MOUTH ONCE DAILY AT BEDTIME 60 tablet 11  . torsemide (DEMADEX) 20 MG tablet Take 3 tabs in AM and 2 tabs in PM    . vitamin B-12 (CYANOCOBALAMIN) 1000 MCG tablet Take 1 tablet (1,000 mcg total) by mouth daily. 90 tablet 0   No current facility-administered medications for this encounter.    Allergies  Allergen Reactions  . Benazepril Other (See Comments)    Unknown reaction at age 47-65 - possibly dizziness  . Angiotensin Receptor Blockers Other (See Comments)    Hypotension reaction  . Fe-Succ-C-Thre-B12-Des Stomach Other (See Comments)    Unknown reaction  . Sulfamethoxazole-Trimethoprim Other (See Comments)    Unknown reaction   Social History   Socioeconomic History  . Marital status: Widowed    Spouse name: Not on file  . Number of children: Not on file  . Years of education: Not on file  . Highest education level: Not on file  Occupational History  . Not on file  Social Needs  . Financial resource strain: Not on file  . Food insecurity    Worry: Not on file    Inability: Not on file  . Transportation needs    Medical: Not on file    Non-medical: Not on file  Tobacco Use  . Smoking status: Never Smoker  . Smokeless tobacco: Never Used  Substance and Sexual Activity  . Alcohol use: No  . Drug use: No  . Sexual activity: Not Currently  Lifestyle  . Physical activity    Days per week: Not on file    Minutes per session: Not on file  . Stress: Not on file  Relationships  . Social Herbalist on phone: Not on file    Gets together: Not on file    Attends religious service: Not on file    Active member of club or organization: Not on file    Attends meetings of clubs or organizations: Not on file    Relationship status: Not  on file  . Intimate partner violence    Fear of current or ex partner: Not on file    Emotionally abused: Not on file    Physically abused: Not on file    Forced sexual activity: Not on file  Other Topics Concern  . Not on file  Social History Narrative   Widowed   Lives alone in [place]   2 children   Not routinely exercising   Family History  Problem Relation Age of Onset  . Clotting disorder Mother        Cerebral hemorrhage  . Emphysema Father        COD  . Lung cancer  Brother   . Heart disease Neg Hx    Vitals:   08/14/19 1101  BP: 122/70  Pulse: 65  SpO2: 100%  Weight: 103.6 kg (228 lb 8 oz)     Wt Readings from Last 3 Encounters:  08/14/19 103.6 kg (228 lb 8 oz)  07/24/19 106.1 kg (233 lb 12.8 oz)  06/24/19 99.3 kg (219 lb)    PHYSICAL EXAM: General:  No resp difficulty. Walked in the clinic with a walker. HEENT: normal Neck: supple. JVP 10-11 . Carotids 2+ bilat; no bruits. No lymphadenopathy or thryomegaly appreciated. Cor: PMI nondisplaced. Regular rate & rhythm. No rubs, gallops or murmurs. Lungs: clear Abdomen: soft, nontender, nondistended. No hepatosplenomegaly. No bruits or masses. Good bowel sounds. Extremities: no cyanosis, clubbing, rash, R and LLE trace-1+edema Neuro: alert & orientedx3, cranial nerves grossly intact. moves all 4 extremities w/o difficulty. Affect pleasant  ASSESSMENT & PLAN:  1. Chronic systolic CHF with prominent RV failure: Ischemic cardiomyopathy.  Echo in 7/19 showed LV EF 45-50% (improved) with mildly dilated/mildly dysfunction RV.  D-shaped septum suggested RV pressure/volume overload.  Echo was done today and showed EF 45%, mildly dysfunctional RV, PASP 46 mmHg with dilated IVC.   NYHA III. Volume status trending back up. Suspect this is due to excessive fluid intake. Today I discussed limiting fluid intake to < 2 liters per day. - Increase morning torsemide to 80 mg and continue 40 mg in the evening.  - Continue Coreg  6.25 mg bid.   - She is not on ACEI/ARB/spironolactone with elevated creatinine.  - She would likely benefit from Cardiomems but she has not a hospitalization in the last year.   2. CAD s/p CABG: No chest pain.  - Continue atorvastatin 40 mg daily. - No ASA with stable CAD on Eliquis.  3. Atrial fibrillation: Chronic. Given long-term atrial fibrillation, she is unlikely to successfully cardiovert.  -Rate controlled.  - Continue Eliquis.   4. CKD stage 3: I reviewed recent BMET   5. H/o CVA: Continue Eliquis.   6. OSA: Continue CPAP.  7. HTN: Stable.  8. Obesity Body mass index is 34.24 kg/m.  Discussed portion control.   Follow up in 8 weeks.    Fannie Gathright NP-C  08/14/2019

## 2019-08-14 ENCOUNTER — Other Ambulatory Visit: Payer: Self-pay

## 2019-08-14 ENCOUNTER — Ambulatory Visit (HOSPITAL_COMMUNITY)
Admission: RE | Admit: 2019-08-14 | Discharge: 2019-08-14 | Disposition: A | Discharge status: Home / Self Care | Payer: Medicare Other | Source: Ambulatory Visit | Attending: Cardiology | Admitting: Cardiology

## 2019-08-14 VITALS — BP 122/70 | HR 65 | Wt 228.5 lb

## 2019-08-14 DIAGNOSIS — I5022 Chronic systolic (congestive) heart failure: Secondary | ICD-10-CM | POA: Diagnosis not present

## 2019-08-14 DIAGNOSIS — Z79899 Other long term (current) drug therapy: Secondary | ICD-10-CM | POA: Insufficient documentation

## 2019-08-14 DIAGNOSIS — E1122 Type 2 diabetes mellitus with diabetic chronic kidney disease: Secondary | ICD-10-CM | POA: Insufficient documentation

## 2019-08-14 DIAGNOSIS — I272 Pulmonary hypertension, unspecified: Secondary | ICD-10-CM | POA: Insufficient documentation

## 2019-08-14 DIAGNOSIS — Z951 Presence of aortocoronary bypass graft: Secondary | ICD-10-CM | POA: Insufficient documentation

## 2019-08-14 DIAGNOSIS — Z6834 Body mass index (BMI) 34.0-34.9, adult: Secondary | ICD-10-CM | POA: Diagnosis not present

## 2019-08-14 DIAGNOSIS — G4733 Obstructive sleep apnea (adult) (pediatric): Secondary | ICD-10-CM | POA: Diagnosis not present

## 2019-08-14 DIAGNOSIS — Z7901 Long term (current) use of anticoagulants: Secondary | ICD-10-CM | POA: Diagnosis not present

## 2019-08-14 DIAGNOSIS — Z8673 Personal history of transient ischemic attack (TIA), and cerebral infarction without residual deficits: Secondary | ICD-10-CM | POA: Diagnosis not present

## 2019-08-14 DIAGNOSIS — Z8249 Family history of ischemic heart disease and other diseases of the circulatory system: Secondary | ICD-10-CM | POA: Insufficient documentation

## 2019-08-14 DIAGNOSIS — R0609 Other forms of dyspnea: Secondary | ICD-10-CM | POA: Diagnosis not present

## 2019-08-14 DIAGNOSIS — I255 Ischemic cardiomyopathy: Secondary | ICD-10-CM | POA: Diagnosis not present

## 2019-08-14 DIAGNOSIS — Z794 Long term (current) use of insulin: Secondary | ICD-10-CM | POA: Insufficient documentation

## 2019-08-14 DIAGNOSIS — I4819 Other persistent atrial fibrillation: Secondary | ICD-10-CM | POA: Diagnosis not present

## 2019-08-14 DIAGNOSIS — N183 Chronic kidney disease, stage 3 unspecified: Secondary | ICD-10-CM

## 2019-08-14 DIAGNOSIS — I2510 Atherosclerotic heart disease of native coronary artery without angina pectoris: Secondary | ICD-10-CM | POA: Insufficient documentation

## 2019-08-14 DIAGNOSIS — I482 Chronic atrial fibrillation, unspecified: Secondary | ICD-10-CM | POA: Diagnosis not present

## 2019-08-14 DIAGNOSIS — I130 Hypertensive heart and chronic kidney disease with heart failure and stage 1 through stage 4 chronic kidney disease, or unspecified chronic kidney disease: Secondary | ICD-10-CM | POA: Diagnosis not present

## 2019-08-14 MED ORDER — TORSEMIDE 20 MG PO TABS: ORAL_TABLET | ORAL | 6 refills | Status: DC

## 2019-08-14 NOTE — Patient Instructions (Signed)
INCREASE Torsemide to 80mg  (4 tabs) every morning and 40mg  (2 tabs) every evening.  Please follow up with the [facility] in 4-6 weeks and again in 3 months.  At the [facility], you and your health needs are our priority. As part of our continuing mission to provide you with exceptional heart care, we have created designated Provider Care Teams. These Care Teams include your primary Cardiologist (physician) and Advanced Practice Providers (APPs- Physician Assistants and Nurse Practitioners) who all work together to provide you with the care you need, when you need it.   You may see any of the following providers on your designated Care Team at your next follow up: Marland Kitchen Dr Glori Bickers . Dr Loralie Champagne . Darrick Grinder, NP   Please be sure to bring in all your medications bottles to every appointment.

## 2019-08-24 ENCOUNTER — Other Ambulatory Visit: Payer: Self-pay

## 2019-08-24 ENCOUNTER — Ambulatory Visit (INDEPENDENT_AMBULATORY_CARE_PROVIDER_SITE_OTHER): Payer: Medicare Other | Admitting: Podiatry

## 2019-08-24 DIAGNOSIS — E1151 Type 2 diabetes mellitus with diabetic peripheral angiopathy without gangrene: Secondary | ICD-10-CM | POA: Diagnosis not present

## 2019-08-24 DIAGNOSIS — E1169 Type 2 diabetes mellitus with other specified complication: Secondary | ICD-10-CM | POA: Diagnosis not present

## 2019-08-24 DIAGNOSIS — B351 Tinea unguium: Secondary | ICD-10-CM | POA: Diagnosis not present

## 2019-08-25 ENCOUNTER — Other Ambulatory Visit (HOSPITAL_COMMUNITY): Payer: Self-pay | Admitting: *Deleted

## 2019-08-25 MED ORDER — TORSEMIDE 20 MG PO TABS: ORAL_TABLET | ORAL | 3 refills | Status: DC

## 2019-09-09 ENCOUNTER — Encounter: Payer: Self-pay | Admitting: Family Medicine

## 2019-09-14 ENCOUNTER — Other Ambulatory Visit: Payer: Self-pay

## 2019-09-14 ENCOUNTER — Encounter (HOSPITAL_COMMUNITY): Payer: Self-pay

## 2019-09-14 ENCOUNTER — Ambulatory Visit (HOSPITAL_COMMUNITY)
Admission: RE | Admit: 2019-09-14 | Discharge: 2019-09-14 | Disposition: A | Discharge status: Home / Self Care | Payer: Medicare Other | Source: Ambulatory Visit | Attending: Internal Medicine | Admitting: Internal Medicine

## 2019-09-14 VITALS — BP 120/76 | HR 70 | Wt 222.4 lb

## 2019-09-14 DIAGNOSIS — I2510 Atherosclerotic heart disease of native coronary artery without angina pectoris: Secondary | ICD-10-CM | POA: Insufficient documentation

## 2019-09-14 DIAGNOSIS — G4733 Obstructive sleep apnea (adult) (pediatric): Secondary | ICD-10-CM | POA: Diagnosis not present

## 2019-09-14 DIAGNOSIS — Z951 Presence of aortocoronary bypass graft: Secondary | ICD-10-CM | POA: Insufficient documentation

## 2019-09-14 DIAGNOSIS — I130 Hypertensive heart and chronic kidney disease with heart failure and stage 1 through stage 4 chronic kidney disease, or unspecified chronic kidney disease: Secondary | ICD-10-CM | POA: Insufficient documentation

## 2019-09-14 DIAGNOSIS — Z7901 Long term (current) use of anticoagulants: Secondary | ICD-10-CM | POA: Diagnosis not present

## 2019-09-14 DIAGNOSIS — E1122 Type 2 diabetes mellitus with diabetic chronic kidney disease: Secondary | ICD-10-CM | POA: Diagnosis not present

## 2019-09-14 DIAGNOSIS — Z8673 Personal history of transient ischemic attack (TIA), and cerebral infarction without residual deficits: Secondary | ICD-10-CM | POA: Insufficient documentation

## 2019-09-14 DIAGNOSIS — I255 Ischemic cardiomyopathy: Secondary | ICD-10-CM | POA: Diagnosis not present

## 2019-09-14 DIAGNOSIS — Z79899 Other long term (current) drug therapy: Secondary | ICD-10-CM | POA: Diagnosis not present

## 2019-09-14 DIAGNOSIS — I4819 Other persistent atrial fibrillation: Secondary | ICD-10-CM

## 2019-09-14 DIAGNOSIS — N183 Chronic kidney disease, stage 3 unspecified: Secondary | ICD-10-CM

## 2019-09-14 DIAGNOSIS — Z794 Long term (current) use of insulin: Secondary | ICD-10-CM | POA: Insufficient documentation

## 2019-09-14 DIAGNOSIS — I272 Pulmonary hypertension, unspecified: Secondary | ICD-10-CM | POA: Insufficient documentation

## 2019-09-14 DIAGNOSIS — Z6833 Body mass index (BMI) 33.0-33.9, adult: Secondary | ICD-10-CM | POA: Insufficient documentation

## 2019-09-14 DIAGNOSIS — I5022 Chronic systolic (congestive) heart failure: Secondary | ICD-10-CM

## 2019-09-14 DIAGNOSIS — I482 Chronic atrial fibrillation, unspecified: Secondary | ICD-10-CM | POA: Insufficient documentation

## 2019-09-14 LAB — BASIC METABOLIC PANEL
Anion gap: 11 (ref 5–15)
BUN: 73 mg/dL — ABNORMAL HIGH (ref 8–23)
CO2: 26 mmol/L (ref 22–32)
Calcium: 9.9 mg/dL (ref 8.9–10.3)
Chloride: 102 mmol/L (ref 98–111)
Creatinine, Ser: 2.01 mg/dL — ABNORMAL HIGH (ref 0.44–1.00)
GFR calc Af Amer: 26 mL/min — ABNORMAL LOW (ref >60)
GFR calc non Af Amer: 23 mL/min — ABNORMAL LOW (ref >60)
Glucose, Bld: 107 mg/dL — ABNORMAL HIGH (ref 70–99)
Potassium: 3.8 mmol/L (ref 3.5–5.1)
Sodium: 139 mmol/L (ref 135–145)

## 2019-09-14 NOTE — Patient Instructions (Signed)
Labs today We will only contact you if something comes back abnormal or we need to make some changes. Otherwise no news is good news!  Your physician recommends that you schedule a follow-up appointment in: 3 months with Dr Aundra Dubin.  At the [facility], you and your health needs are our priority. As part of our continuing mission to provide you with exceptional heart care, we have created designated Provider Care Teams. These Care Teams include your primary Cardiologist (physician) and Advanced Practice Providers (APPs- Physician Assistants and Nurse Practitioners) who all work together to provide you with the care you need, when you need it.   You may see any of the following providers on your designated Care Team at your next follow up: Marland Kitchen Dr Glori Bickers . Dr Loralie Champagne . Darrick Grinder, NP . Lyda Jester, PA   Please be sure to bring in all your medications bottles to every appointment.

## 2019-09-14 NOTE — Progress Notes (Signed)
Advanced Heart Failure Clinic Note   Primary Care: Dr. Shan Levans Primary Cardiologist: Dr. Angelena Form HF Cardiology: Dr. Aundra Dubin  HPI: [name] is a 78 y.o. female with history of CAD status post CABG 2009, ischemic cardiomyopathy with chronic systolic CHF, persistent atrial fibrillation with stroke in 03/2016 when INR was subtherapeutic, stage III CKD, morbid obesity, pulmonary hypertension, diabetes, GI bleed 03/2017, and severe sleep apnea.   Seen in [facility] clinics 09/10/18 and 09/24/18, both times with volume overload and marked peripheral edema with skin breakdown. Torsemide increased at each visit. Echo in 7/19 showed EF 45-50% with mild RV dilation/mild RV systolic dysfunction and D-shaped interventricular septum.    AKI earlier in 7/20 in setting of excessive torsemide, she had increased dose to 60 qam/40 qpm on her own.  This was cut back to 40 mg daily and losartan and spironolactone were stopped.   Echo was done today and reviewed. EF 45% with mildly decreased RV systolic function, PASP 46 mmHg with dilated IVC.   Today she returns for HF follow up. Last visit torsemide was increased to 80 mg in the am and continued 40 mg daily. Overall feeling much better. Denies SOB/PND/Orthopnea. Ambulating with a walker. Walking in the house without difficulty. Using CPAP every night. Appetite ok. No fever or chills. Weight at home trending from 228-->222  pounds. Taking all medications.  Labs (12/18): K 3.9, Creatinine 1.65 Labs (5/19): K 4.1, creatinine 1.7 Labs (6/19): hgb 8.2 Labs (8/19): K 4.3, creatinine 1.73 Labs (9/19): K 4, creatinine 1.56 Labs (11/19): K 4.7, creatinine 2.04 Labs (12/19): creatinine 1.44 Labs (7/20): K 5, creatinine 2.49 => 1.86, Hgb 11.1 Labs (8/20): K 3.9, creatinine 1.79 Labs (07/31/19): K 4.2 Creatinine 1.8  Review of systems complete and found to be negative unless listed in HPI.    Past Medical History 1. Chronic systolic CHF: Ischemic cardiomyopathy with  prominent RV failure.   - Echo (11/18): LVEF 35-40%, severe RV dilation, severe RAE, severe TR.  - Echo (7/19): EF 45-50%, diffuse hypokinesis, mild RV dilation with mildly decreased RV systolic function, D-shaped interventricular septum suggestive of RV pressure/volume overload, mild MR, moderate TR, PASP 49 mmhg.  - Echo (9/20): EF 45%, mild LV dilation, mild RV dilation with mildly decreased systolic function, PASP 46 mmHg, IVC dilated.  2. CAD: CABG 2009. No angiography since that time.   3. HTN 4. Atrial fibrillation: Chronic since 2013.  5. CKD stage 3 6. Morbid obesity 7. OSA 8. Chronic venous stasis with RLE wound/Bullae 9. CVA 5/18.   Current Outpatient Medications  Medication Sig Dispense Refill  . acetaminophen (TYLENOL) 650 MG CR tablet Take 650 mg by mouth every 8 (eight) hours as needed for pain.    Marland Kitchen apixaban (ELIQUIS) 5 MG TABS tablet Take 1 tablet (5 mg total) by mouth 2 (two) times daily. 60 tablet 11  . atorvastatin (LIPITOR) 40 MG tablet TAKE 1 TABLET BY MOUTH  DAILY AT 6 PM. 90 tablet 3  . carvedilol (COREG) 6.25 MG tablet Take 1 tablet (6.25 mg total) by mouth 2 (two) times daily with a meal. 60 tablet 6  . cholecalciferol (VITAMIN D) 1000 UNITS tablet Take 1,000 Units by mouth daily.     . ferrous sulfate 325 (65 FE) MG tablet Take 1 tablet (325 mg total) by mouth daily with breakfast. 60 tablet 3  . glucose blood (ONE TOUCH ULTRA TEST) test strip Use three times a day 100 each 3  . insulin glargine (LANTUS) 100 UNIT/ML  injection Inject 12 Units into the skin at bedtime.    . Insulin Pen Needle (PEN NEEDLES) 32G X 4 MM MISC 1 each by Does not apply route at bedtime. Use to inject Lantus each night. 90 each 12  . Lancets (ONETOUCH DELICA PLUS OZDGUY40H) MISC 1 application by Does not apply route daily. 100 each 6  . nitroGLYCERIN (NITROSTAT) 0.4 MG SL tablet Place 1 tablet (0.4 mg total) under the tongue every 5 (five) minutes as needed for chest pain (up to 3 doses).  15 tablet 5  . potassium chloride SA (K-DUR) 20 MEQ tablet Take 2 tablets (40 mEq total) by mouth every morning AND 1 tablet (20 mEq total) every evening. 60 tablet 11  . rOPINIRole (REQUIP) 0.5 MG tablet TAKE 2 TABLETS BY MOUTH ONCE DAILY AT BEDTIME 60 tablet 11  . torsemide (DEMADEX) 20 MG tablet Take 80mg  in the AM and 40mg  in the PM 180 tablet 3  . vitamin B-12 (CYANOCOBALAMIN) 1000 MCG tablet Take 1 tablet (1,000 mcg total) by mouth daily. 90 tablet 0   No current facility-administered medications for this encounter.    Allergies  Allergen Reactions  . Benazepril Other (See Comments)    Unknown reaction at age 73-65 - possibly dizziness  . Angiotensin Receptor Blockers Other (See Comments)    Hypotension reaction  . Fe-Succ-C-Thre-B12-Des Stomach Other (See Comments)    Unknown reaction  . Sulfamethoxazole-Trimethoprim Other (See Comments)    Unknown reaction   Social History   Socioeconomic History  . Marital status: Widowed    Spouse name: Not on file  . Number of children: Not on file  . Years of education: Not on file  . Highest education level: Not on file  Occupational History  . Not on file  Social Needs  . Financial resource strain: Not on file  . Food insecurity    Worry: Not on file    Inability: Not on file  . Transportation needs    Medical: Not on file    Non-medical: Not on file  Tobacco Use  . Smoking status: Never Smoker  . Smokeless tobacco: Never Used  Substance and Sexual Activity  . Alcohol use: No  . Drug use: No  . Sexual activity: Not Currently  Lifestyle  . Physical activity    Days per week: Not on file    Minutes per session: Not on file  . Stress: Not on file  Relationships  . Social Herbalist on phone: Not on file    Gets together: Not on file    Attends religious service: Not on file    Active member of club or organization: Not on file    Attends meetings of clubs or organizations: Not on file    Relationship  status: Not on file  . Intimate partner violence    Fear of current or ex partner: Not on file    Emotionally abused: Not on file    Physically abused: Not on file    Forced sexual activity: Not on file  Other Topics Concern  . Not on file  Social History Narrative   Widowed   Lives alone in [place]   2 children   Not routinely exercising   Family History  Problem Relation Age of Onset  . Clotting disorder Mother        Cerebral hemorrhage  . Emphysema Father        COD  . Lung cancer Brother   .  Heart disease Neg Hx    Vitals:   09/14/19 1025  BP: 120/76  Pulse: 70  SpO2: 99%  Weight: 100.9 kg (222 lb 6.4 oz)     Wt Readings from Last 3 Encounters:  09/14/19 100.9 kg (222 lb 6.4 oz)  08/14/19 103.6 kg (228 lb 8 oz)  07/24/19 106.1 kg (233 lb 12.8 oz)    PHYSICAL EXAM: General:  Well appearing. No resp difficulty HEENT: normal Neck: supple. no JVD. Carotids 2+ bilat; no bruits. No lymphadenopathy or thryomegaly appreciated. Cor: PMI nondisplaced. Irregular rate & rhythm. No rubs, gallops or murmurs. Lungs: clear Abdomen: soft, nontender, nondistended. No hepatosplenomegaly. No bruits or masses. Good bowel sounds. Extremities: no cyanosis, clubbing, rash, edema Neuro: alert & orientedx3, cranial nerves grossly intact. moves all 4 extremities w/o difficulty. Affect pleasant  ASSESSMENT & PLAN: 1. Chronic systolic CHF with prominent RV failure: Ischemic cardiomyopathy.  Echo in 7/19 showed LV EF 45-50% (improved) with mildly dilated/mildly dysfunction RV.  D-shaped septum suggested RV pressure/volume overload.  Echo 07/24/19 showed EF 45%, mildly dysfunctional RV, PASP 46 mmHg with dilated IVC.   -NYHA II. Volume status stable. Continue torsemide to 80 mg/40 mg.  - Continue Coreg 6.25 mg bid.   - She is not on ACEI/ARB/spironolactone with elevated creatinine.  - She would likely benefit from Cardiomems but she has not a hospitalization in the last year.   2. CAD s/p  CABG: No chest pain.  - Continue atorvastatin 40 mg daily. - No ASA with stable CAD on Eliquis.  3. Atrial fibrillation: Chronic. Given long-term atrial fibrillation, she is unlikely to successfully cardiovert.  -Rate controlled.  - Continue Eliquis.   4. CKD stage 3: Check BMET today.  5. H/o CVA: Continue Eliquis.   6. OSA: continue CPAP.  7. HTN: Stable.  8. Obesity Body mass index is 33.32 kg/m.  Discussed portion control.    Follow up in 3 months. Check BMET today.   Anyelina Claycomb NP-C  09/14/2019

## 2019-09-28 DIAGNOSIS — H353132 Nonexudative age-related macular degeneration, bilateral, intermediate dry stage: Secondary | ICD-10-CM | POA: Diagnosis not present

## 2019-09-28 DIAGNOSIS — H26493 Other secondary cataract, bilateral: Secondary | ICD-10-CM | POA: Diagnosis not present

## 2019-09-28 DIAGNOSIS — H17821 Peripheral opacity of cornea, right eye: Secondary | ICD-10-CM | POA: Diagnosis not present

## 2019-09-28 DIAGNOSIS — Z961 Presence of intraocular lens: Secondary | ICD-10-CM | POA: Diagnosis not present

## 2019-09-28 DIAGNOSIS — H43811 Vitreous degeneration, right eye: Secondary | ICD-10-CM | POA: Diagnosis not present

## 2019-09-28 LAB — HM DIABETES EYE EXAM

## 2019-10-20 ENCOUNTER — Other Ambulatory Visit: Payer: Self-pay

## 2019-10-20 ENCOUNTER — Ambulatory Visit (INDEPENDENT_AMBULATORY_CARE_PROVIDER_SITE_OTHER): Payer: Medicare Other | Admitting: Infectious Diseases

## 2019-10-20 ENCOUNTER — Other Ambulatory Visit: Payer: Self-pay | Admitting: Family Medicine

## 2019-10-20 ENCOUNTER — Ambulatory Visit: Payer: Medicare Other | Admitting: Infectious Diseases

## 2019-10-20 ENCOUNTER — Telehealth: Payer: Self-pay | Admitting: *Deleted

## 2019-10-20 ENCOUNTER — Encounter: Payer: Self-pay | Admitting: Infectious Diseases

## 2019-10-20 DIAGNOSIS — M4620 Osteomyelitis of vertebra, site unspecified: Secondary | ICD-10-CM

## 2019-10-20 DIAGNOSIS — I63411 Cerebral infarction due to embolism of right middle cerebral artery: Secondary | ICD-10-CM

## 2019-10-20 NOTE — Assessment & Plan Note (Signed)
3 months following cessation of all antibiotics and she is doing very well after a prolonged treatment for actinomyces vertebral infection. Her inflammatory markers normalized on therapy which is all very encouraging signs we have achieved a cure of her infection.  No further follow up or antibiotics warranted at this time.   I advised her to reach out to her PCP to help with the specific walker she would need.

## 2019-10-20 NOTE — Progress Notes (Signed)
Patient: [name]  DOB: [birth year] MRN: [id number] PCP: Lennox Solders, MD      Name: [name] [id number]  DOB: 1941-04-29  PCP: Lennox Solders, MD   Virtual Visit via Telephone Note  I connected with [name] on 10/20/19 at 10:00 AM EST by telephone and verified that I am speaking with the correct person using two identifiers.   I discussed the limitations, risks, security and privacy concerns of performing an evaluation and management service by telephone and the availability of in person appointments. I also discussed with the patient that there may be a patient responsible charge related to this service. The patient expressed understanding and agreed to proceed.   Patient Active Problem List   Diagnosis Date Noted   Vertebral osteomyelitis (HCC) 10/06/2018    Priority: High   Decubitus ulcer of coccyx, stage 2 (HCC) 10/13/2018   CHF (congestive heart failure) (HCC)    Foot deformity 08/05/2018   Intertrigo 08/05/2018   Iron deficiency anemia 05/19/2018   Cloudy urine 04/03/2018   Dysuria 11/26/2017   OSA on CPAP    Hypokalemia 11/20/2017   Longstanding persistent atrial fibrillation (HCC)    Ischemic cardiomyopathy    Hypertension    Hemiparesis (HCC)    Facial weakness, post-stroke    Chronic systolic CHF (congestive heart failure) (HCC)    Chronic lower back pain    Arthritis    Venous stasis 09/13/2017   Morbid obesity with BMI of 40.0-44.9, adult (HCC) 08/14/2017   Bilateral leg edema 07/18/2017   Blood loss anemia 04/09/2017   History of CVA with residual deficit 03/26/2017   Pulmonary hypertension (HCC) 03/26/2017   Morbid obesity (HCC) 03/26/2017   Gastrointestinal hemorrhage 03/26/2017   Rectal bleeding    Chronic pain syndrome    Coronary artery disease involving coronary bypass graft of native heart without angina pectoris    Benign essential HTN    DM type 2 with diabetic peripheral  neuropathy (HCC)    Thrombocytopenia (HCC)    Cerebrovascular accident (CVA) due to embolism of right middle cerebral artery (HCC)    CKD (chronic kidney disease) stage 3, GFR 30-59 ml/min (HCC) 03/30/2016   Chronic anticoagulation 03/30/2016   Hyperlipidemia 01/31/2009   ACC/AHA stage C congestive heart failure due to ischemic cardiomyopathy (HCC) 01/31/2009   Chronic systolic heart failure (HCC) 01/31/2009   Atrial fibrillation (HCC) 01/31/2009   CAD (coronary artery disease) 05/12/2008     Subjective:  CC:  Discitis follow up. Off antibiotics x 6 weeks.   Brief ID Hx:  [name] is a 78 y.o. female with  PMHxT2DM, HFrEF (35-40%), CKD3, HTN, HLD, OSA on CPAP, afib on Eliquis, h/o CVA 2017, CAD s/p CABG 2009, morbid obesity.    Admitted to [facility] on 10/06/2018 with worsening back pain x 4 weeks. MRI findings at the time indicated L2-3 discitis/osteomyelitis with right paraspinal soft tissue inflammation amongst chronic ankylosis and foraminal stenosis. Aspiration of lumbar space revealed actinomyces species. She had 12-18 months before had 4 infected teeth pulled. Completed 6 weeks of IV penicillin - January 7th and transitioned to 500 mg amox QID. Was seen shortly after discharge from SNF in February 2020 with worsened back pain - inflammatory markers nearly normalized but switched to doxycycline for simpler regimen.    HPI:  She had a great Thanksgiving and cooked for her family. She was able to help a good bit with preparing the food and entertaining  guests. Her back feels fine and without pain until she has to stand up; she is not able to tolerate being on her feet very long at a time. She would like an upright walker and asking how to get one.   She has had no increased or worsening quality of her back pain and describes it to be just as it was prior to her infection. She has no fevers/chills to speak of.   She continues to follow up with Dr. Shirlee Latch and the HF team  to help with fluid management. Her weights have been stable and her swelling has improved in lower extremities which makes it much easier to walk.    Review of Systems  Constitutional: Negative for chills, fever, malaise/fatigue and weight loss.  HENT: Negative for sore throat.        No dental problems  Respiratory: Negative for cough and sputum production.   Cardiovascular: Negative for chest pain and leg swelling (improved).  Gastrointestinal: Negative for abdominal pain, diarrhea and vomiting.  Genitourinary: Negative for dysuria and flank pain.  Musculoskeletal: Positive for back pain (baseline). Negative for joint pain, myalgias and neck pain.  Skin: Negative for rash.  Neurological: Negative for dizziness, tingling and headaches.  Psychiatric/Behavioral: Negative for depression and substance abuse. The patient is not nervous/anxious and does not have insomnia.     Past Medical History:  Diagnosis Date   ACC/AHA stage C congestive heart failure due to ischemic cardiomyopathy (HCC) 01/31/2009   Qualifier: Diagnosis of  By: Juanda Chance, MD, Johny Chess    ANXIETY 01/31/2009   Qualifier: Diagnosis of  By: Cristela Felt, CNA, Christy     Arthritis    "qwhere" (03/30/2016)   Benign essential HTN    CAD (coronary artery disease) 05/2008   a. s/p CABG in 2009.   Cerebrovascular accident (CVA) due to embolism of right middle cerebral artery (HCC)    Chronic anticoagulation    Chronic kidney disease (CKD), stage III (moderate)    Chronic lower back pain    Chronic pain syndrome    Chronic systolic CHF (congestive heart failure) (HCC)    DM type 2 with diabetic peripheral neuropathy (HCC)    Facial weakness, post-stroke    Gastrointestinal hemorrhage 03/26/2017   Hemiparesis (HCC)    History of CVA with residual deficit 03/26/2017   Hyperlipidemia    Hypertension    Ischemic cardiomyopathy    Longstanding persistent atrial fibrillation (HCC)    a. Postoperative in 2009;   S/P transesophageal echocardiography-guided cardioversion, previously on Amiodarone and Coumadin. b. recurrent in April 2013 -> pt elected rate control strategy, initially Coumadin -> had stroke with subtherapeutic INR in 03/2016 and transitioned to Eliquis.   Morbid obesity (HCC) 03/26/2017   OSA on CPAP    severe OSA with AHI 34/hr   Persistent atrial fibrillation (HCC)    Thrombocytopenia (HCC)     Outpatient Medications Prior to Visit  Medication Sig Dispense Refill   acetaminophen (TYLENOL) 650 MG CR tablet Take 650 mg by mouth every 8 (eight) hours as needed for pain.     apixaban (ELIQUIS) 5 MG TABS tablet Take 1 tablet (5 mg total) by mouth 2 (two) times daily. 60 tablet 11   atorvastatin (LIPITOR) 40 MG tablet TAKE 1 TABLET BY MOUTH  DAILY AT 6 PM. 90 tablet 3   carvedilol (COREG) 6.25 MG tablet Take 1 tablet (6.25 mg total) by mouth 2 (two) times daily with a meal. 60 tablet 6   cholecalciferol (  VITAMIN D) 1000 UNITS tablet Take 1,000 Units by mouth daily.      ferrous sulfate 325 (65 FE) MG tablet Take 1 tablet (325 mg total) by mouth daily with breakfast. 60 tablet 3   glucose blood (ONE TOUCH ULTRA TEST) test strip Use three times a day 100 each 3   insulin glargine (LANTUS) 100 UNIT/ML injection Inject 12 Units into the skin at bedtime.     Insulin Pen Needle (PEN NEEDLES) 32G X 4 MM MISC 1 each by Does not apply route at bedtime. Use to inject Lantus each night. 90 each 12   Lancets (ONETOUCH DELICA PLUS LANCET33G) MISC 1 application by Does not apply route daily. 100 each 6   nitroGLYCERIN (NITROSTAT) 0.4 MG SL tablet Place 1 tablet (0.4 mg total) under the tongue every 5 (five) minutes as needed for chest pain (up to 3 doses). 15 tablet 5   potassium chloride SA (K-DUR) 20 MEQ tablet Take 2 tablets (40 mEq total) by mouth every morning AND 1 tablet (20 mEq total) every evening. 60 tablet 11   rOPINIRole (REQUIP) 0.5 MG tablet TAKE 2 TABLETS BY MOUTH ONCE DAILY  AT BEDTIME 60 tablet 11   torsemide (DEMADEX) 20 MG tablet Take 80mg  in the AM and 40mg  in the PM 180 tablet 3   vitamin B-12 (CYANOCOBALAMIN) 1000 MCG tablet Take 1 tablet (1,000 mcg total) by mouth daily. 90 tablet 0   No facility-administered medications prior to visit.      Allergies  Allergen Reactions   Benazepril Other (See Comments)    Unknown reaction at age 61-65 - possibly dizziness   Angiotensin Receptor Blockers Other (See Comments)    Hypotension reaction   Fe-Succ-C-Thre-B12-Des Stomach Other (See Comments)    Unknown reaction   Sulfamethoxazole-Trimethoprim Other (See Comments)    Unknown reaction    Social History   Tobacco Use   Smoking status: Never Smoker   Smokeless tobacco: Never Used  Substance Use Topics   Alcohol use: No   Drug use: No    Objective:   Ms. Brennon sounds to be in good spirits over the phone today. I do not detect any shortness of breath.   Sed Rate  Date Value  06/09/2019 13 mm/hr  11/28/2018 38 mm/h (H)  11/19/2018 50 mm/h (H)   CRP  Date Value  06/09/2019 <0.8 mg/dL  21/30/8657 8.3 mg/L (H)  11/19/2018 11.1 mg/L (H)     Assessment & Plan:   Problem List Items Addressed This Visit      High   Vertebral osteomyelitis (HCC)    3 months following cessation of all antibiotics and she is doing very well after a prolonged treatment for actinomyces vertebral infection. Her inflammatory markers normalized on therapy which is all very encouraging signs we have achieved a cure of her infection.  No further follow up or antibiotics warranted at this time.   I advised her to reach out to her PCP to help with the specific walker she would need.          Rexene Alberts, MSN, NP-C [facility] for Infectious Disease [facility]  [email or web address] Pager: [phone] Office: [phone] [facility] Main Line: [phone]    10/20/19  10:04 AM

## 2019-10-20 NOTE — Telephone Encounter (Signed)
Pt would like a stand up lite rollator.  If PCP is willing to placed order I can send message to [facility].

## 2019-10-20 NOTE — Telephone Encounter (Signed)
I have placed the order if you will message [facility].  Thank you!

## 2019-10-21 NOTE — Telephone Encounter (Signed)
Community message sent to [employer] and Darlina Guys @ [facility] to process DME order for Stand up lite rollator.   Christen Bame, CMA

## 2019-10-23 NOTE — Telephone Encounter (Signed)
Heard back from [facility], since this is a special walker it will cost $360 as it is not covered by insurance.  Pt informed and will given it some thought. Christen Bame, CMA

## 2019-10-27 ENCOUNTER — Other Ambulatory Visit: Payer: Self-pay | Admitting: *Deleted

## 2019-10-27 MED ORDER — FERROUS SULFATE 325 (65 FE) MG PO TABS: 325.0000 mg | ORAL_TABLET | Freq: Every day | ORAL | 3 refills | Status: DC

## 2019-11-03 ENCOUNTER — Telehealth: Payer: Self-pay | Admitting: *Deleted

## 2019-11-03 DIAGNOSIS — E1142 Type 2 diabetes mellitus with diabetic polyneuropathy: Secondary | ICD-10-CM

## 2019-11-03 MED ORDER — DICLOFENAC SODIUM 1 % EX GEL: 2.0000 g | Freq: Four times a day (QID) | CUTANEOUS | 0 refills | Status: DC

## 2019-11-03 MED ORDER — ACETAMINOPHEN ER 650 MG PO TBCR: 650.0000 mg | EXTENDED_RELEASE_TABLET | Freq: Three times a day (TID) | ORAL | 0 refills | Status: DC | PRN

## 2019-11-03 NOTE — Telephone Encounter (Signed)
Pt wants to know if she can use neuriterx (an OTC cream) for her neuropathy.   To PCP. Christen Bame, CMA

## 2019-11-03 NOTE — Telephone Encounter (Signed)
Patient to try OTC diclofenac gel and tylenol. Patient to schedule an appointment if no improvement.

## 2019-11-03 NOTE — Telephone Encounter (Signed)
Spoke with patient and states that she has been calling several pharmacies looking for this cream and hasn't been able to find it.  She would like to know if she can get something called for her neuropathy in her hands.  She said that she had a hard night yesterday with the burning and tingling.  Jerae Izard,CMA

## 2019-11-03 NOTE — Telephone Encounter (Signed)
Patient informed of OTC voltaren and will try this.  Kambria Grima,CMA

## 2019-11-12 ENCOUNTER — Other Ambulatory Visit: Payer: Self-pay | Admitting: Family Medicine

## 2019-11-12 DIAGNOSIS — I5022 Chronic systolic (congestive) heart failure: Secondary | ICD-10-CM

## 2019-11-25 ENCOUNTER — Other Ambulatory Visit: Payer: Self-pay

## 2019-11-25 MED ORDER — INSULIN GLARGINE 100 UNIT/ML ~~LOC~~ SOLN: 12.0000 [IU] | Freq: Every day | SUBCUTANEOUS | 0 refills | Status: DC

## 2019-11-25 NOTE — Telephone Encounter (Signed)
Patient calls nurse line requesting refill on Lantus. Patient states she is now taking 12 unites, instead of 7.

## 2019-11-28 ENCOUNTER — Other Ambulatory Visit: Payer: Self-pay | Admitting: Family Medicine

## 2019-11-30 ENCOUNTER — Ambulatory Visit (INDEPENDENT_AMBULATORY_CARE_PROVIDER_SITE_OTHER): Payer: Medicare Other | Admitting: Podiatry

## 2019-11-30 ENCOUNTER — Other Ambulatory Visit: Payer: Self-pay

## 2019-11-30 ENCOUNTER — Telehealth: Payer: Self-pay

## 2019-11-30 DIAGNOSIS — E1151 Type 2 diabetes mellitus with diabetic peripheral angiopathy without gangrene: Secondary | ICD-10-CM | POA: Diagnosis not present

## 2019-11-30 DIAGNOSIS — M2041 Other hammer toe(s) (acquired), right foot: Secondary | ICD-10-CM

## 2019-11-30 DIAGNOSIS — B351 Tinea unguium: Secondary | ICD-10-CM | POA: Diagnosis not present

## 2019-11-30 DIAGNOSIS — M2011 Hallux valgus (acquired), right foot: Secondary | ICD-10-CM | POA: Diagnosis not present

## 2019-11-30 DIAGNOSIS — M2042 Other hammer toe(s) (acquired), left foot: Secondary | ICD-10-CM | POA: Diagnosis not present

## 2019-11-30 DIAGNOSIS — M2012 Hallux valgus (acquired), left foot: Secondary | ICD-10-CM

## 2019-11-30 NOTE — Progress Notes (Signed)
  Subjective:  Patient ID: [name], female    DOB: 1941-05-12,  MRN: [id number]  No chief complaint on file.   79 y.o. female presents with the above complaint. History confirmed with patient.   Objective:  Physical Exam: warm, good capillary refill, nail exam onychomycosis of the toenails, no trophic changes or ulcerative lesions, normal DP and non-palp PT pulses, and normal sensory exam. Left Foot: bunion deformity noted and hammertoes  Right Foot: bunion deformity noted and hammertoes   No images are attached to the encounter.  Assessment:   1. Diabetes mellitus type 2 with peripheral artery disease ([place])   2. Onychomycosis   3. Acquired hallux valgus of both feet   4. Hammer toes of both feet      Plan:  Patient was evaluated and treated and all questions answered.  Onychomycosis, Diabetes and PAD -Patient is diabetic with a qualifying condition for at risk foot care. -Would benefit from DM shoes. Will make appt for fabrication  Procedure: Nail Debridement Rationale: Patient meets criteria for routine foot care due to PAD Type of Debridement: manual, sharp debridement. Instrumentation: Nail nipper, rotary burr. Number of Nails: 10  Return in about 3 months (around 02/28/2020) for Diabetic Foot Care.

## 2019-11-30 NOTE — Telephone Encounter (Signed)
Pharmacy calls nurse line stating unfortunately 12 units of insulin only comes in a 90 day supply, therefore costing the patient a 105 dollars. Per pharmacist, if you write 12 units "per sliding scale," they will be able to dispense as a 30 day supply, costing the patient right around 30 dollars. Please advise.

## 2019-12-01 ENCOUNTER — Telehealth (HOSPITAL_COMMUNITY): Payer: Self-pay | Admitting: Pharmacy Technician

## 2019-12-01 ENCOUNTER — Other Ambulatory Visit: Payer: Self-pay | Admitting: Family Medicine

## 2019-12-01 ENCOUNTER — Telehealth (HOSPITAL_COMMUNITY): Payer: Self-pay | Admitting: Pharmacist

## 2019-12-01 MED ORDER — APIXABAN 5 MG PO TABS: 5.0000 mg | ORAL_TABLET | Freq: Two times a day (BID) | ORAL | 11 refills | Status: DC

## 2019-12-01 MED ORDER — LANTUS SOLOSTAR 100 UNIT/ML ~~LOC~~ SOPN: 12.0000 [IU] | PEN_INJECTOR | Freq: Every day | SUBCUTANEOUS | 0 refills | Status: DC

## 2019-12-01 NOTE — Telephone Encounter (Signed)
Received a message that patient needed to re-enroll in patient assistance for Eliquis. Called patient and left a message for her to return my call so we could get started on an application with [employer] for her.  Charlann Boxer, CPhT

## 2019-12-01 NOTE — Telephone Encounter (Signed)
Spoke with patient regarding re-enrollment in [employer] patient assistance program. Will fill out patient and provider portion of application and leave patient section up at front desk for patient signature. Instructed patient that we will need income documentation (social security statement) and pharmacy out of pocket expense report.

## 2019-12-01 NOTE — Telephone Encounter (Signed)
Patient spoke with [name] and we are going to put her application at the front desk for her to sign when she comes in for her next appointment.  Charlann Boxer, CPhT

## 2019-12-01 NOTE — Telephone Encounter (Signed)
I made that change on her prescription.  Thanks for your help.

## 2019-12-17 ENCOUNTER — Telehealth (HOSPITAL_COMMUNITY): Payer: Self-pay

## 2019-12-17 NOTE — Telephone Encounter (Signed)

## 2019-12-18 ENCOUNTER — Telehealth (HOSPITAL_COMMUNITY): Payer: Self-pay | Admitting: Pharmacist

## 2019-12-18 ENCOUNTER — Other Ambulatory Visit: Payer: Self-pay

## 2019-12-18 ENCOUNTER — Ambulatory Visit (HOSPITAL_COMMUNITY): Payer: Self-pay | Admitting: Pharmacist

## 2019-12-18 ENCOUNTER — Ambulatory Visit (HOSPITAL_COMMUNITY)
Admission: RE | Admit: 2019-12-18 | Discharge: 2019-12-18 | Disposition: A | Discharge status: Home / Self Care | Payer: Medicare Other | Source: Ambulatory Visit | Attending: Cardiology | Admitting: Cardiology

## 2019-12-18 ENCOUNTER — Encounter (HOSPITAL_COMMUNITY): Payer: Self-pay | Admitting: Cardiology

## 2019-12-18 VITALS — BP 142/78 | HR 61 | Wt 219.6 lb

## 2019-12-18 DIAGNOSIS — Z8673 Personal history of transient ischemic attack (TIA), and cerebral infarction without residual deficits: Secondary | ICD-10-CM | POA: Insufficient documentation

## 2019-12-18 DIAGNOSIS — Z794 Long term (current) use of insulin: Secondary | ICD-10-CM | POA: Diagnosis not present

## 2019-12-18 DIAGNOSIS — I130 Hypertensive heart and chronic kidney disease with heart failure and stage 1 through stage 4 chronic kidney disease, or unspecified chronic kidney disease: Secondary | ICD-10-CM | POA: Insufficient documentation

## 2019-12-18 DIAGNOSIS — I4819 Other persistent atrial fibrillation: Secondary | ICD-10-CM

## 2019-12-18 DIAGNOSIS — Z7901 Long term (current) use of anticoagulants: Secondary | ICD-10-CM | POA: Insufficient documentation

## 2019-12-18 DIAGNOSIS — G4733 Obstructive sleep apnea (adult) (pediatric): Secondary | ICD-10-CM | POA: Diagnosis not present

## 2019-12-18 DIAGNOSIS — N183 Chronic kidney disease, stage 3 unspecified: Secondary | ICD-10-CM | POA: Insufficient documentation

## 2019-12-18 DIAGNOSIS — I272 Pulmonary hypertension, unspecified: Secondary | ICD-10-CM | POA: Insufficient documentation

## 2019-12-18 DIAGNOSIS — I2510 Atherosclerotic heart disease of native coronary artery without angina pectoris: Secondary | ICD-10-CM | POA: Insufficient documentation

## 2019-12-18 DIAGNOSIS — Z951 Presence of aortocoronary bypass graft: Secondary | ICD-10-CM | POA: Insufficient documentation

## 2019-12-18 DIAGNOSIS — I482 Chronic atrial fibrillation, unspecified: Secondary | ICD-10-CM | POA: Insufficient documentation

## 2019-12-18 DIAGNOSIS — E1122 Type 2 diabetes mellitus with diabetic chronic kidney disease: Secondary | ICD-10-CM | POA: Diagnosis not present

## 2019-12-18 DIAGNOSIS — Z79899 Other long term (current) drug therapy: Secondary | ICD-10-CM | POA: Diagnosis not present

## 2019-12-18 DIAGNOSIS — I5022 Chronic systolic (congestive) heart failure: Secondary | ICD-10-CM

## 2019-12-18 DIAGNOSIS — I255 Ischemic cardiomyopathy: Secondary | ICD-10-CM | POA: Diagnosis not present

## 2019-12-18 LAB — BASIC METABOLIC PANEL
Anion gap: 18 — ABNORMAL HIGH (ref 5–15)
BUN: 60 mg/dL — ABNORMAL HIGH (ref 8–23)
CO2: 20 mmol/L — ABNORMAL LOW (ref 22–32)
Calcium: 9.8 mg/dL (ref 8.9–10.3)
Chloride: 104 mmol/L (ref 98–111)
Creatinine, Ser: 1.91 mg/dL — ABNORMAL HIGH (ref 0.44–1.00)
GFR calc Af Amer: 28 mL/min — ABNORMAL LOW (ref >60)
GFR calc non Af Amer: 24 mL/min — ABNORMAL LOW (ref >60)
Glucose, Bld: 170 mg/dL — ABNORMAL HIGH (ref 70–99)
Potassium: 4 mmol/L (ref 3.5–5.1)
Sodium: 142 mmol/L (ref 135–145)

## 2019-12-18 NOTE — Patient Instructions (Addendum)
No medication changes today  Labs today We will only contact you if something comes back abnormal or we need to make some changes. Otherwise no news is good news!   Your physician recommends that you schedule a follow-up appointment in: 3 months with Dr Aundra Dubin on May 5th, 2021 at 10:00AM. Garage Code 5008   Please call office at [phone] option 2 if you have any questions or concerns.   At the [facility], you and your health needs are our priority. As part of our continuing mission to provide you with exceptional heart care, we have created designated Provider Care Teams. These Care Teams include your primary Cardiologist (physician) and Advanced Practice Providers (APPs- Physician Assistants and Nurse Practitioners) who all work together to provide you with the care you need, when you need it.   You may see any of the following providers on your designated Care Team at your next follow up: Marland Kitchen Dr Glori Bickers . Dr Loralie Champagne . Darrick Grinder, NP . Lyda Jester, PA . Audry Riles, PharmD   Please be sure to bring in all your medications bottles to every appointment.

## 2019-12-18 NOTE — Telephone Encounter (Signed)
Sent in Manufacturer's Assistance application to [place] for Eliquis re-enrollment.   Fax: [phone]  Application pending, will continue to follow.  Audry Riles, PharmD, BCPS, BCCP, CPP Heart Failure Clinic Pharmacist [phone]

## 2019-12-18 NOTE — Progress Notes (Signed)
Entered in error

## 2019-12-20 NOTE — Progress Notes (Signed)
Advanced Heart Failure Clinic Note   Primary Care: Dr. Shan Levans Primary Cardiologist: Dr. Angelena Form HF Cardiology: Dr. Aundra Dubin  HPI:  [name] is a 79 y.o. female with history of CAD status post CABG 2009, ischemic cardiomyopathy with chronic systolic CHF, persistent atrial fibrillation with stroke in 03/2016 when INR was subtherapeutic, stage III CKD, morbid obesity, pulmonary hypertension, diabetes, GI bleed 03/2017, and severe sleep apnea.   Seen in [facility] 09/10/18 and 09/24/18, both times with volume overload and marked peripheral edema with skin breakdown. Torsemide increased at each visit. Echo in 7/19 showed EF 45-50% with mild RV dilation/mild RV systolic dysfunction and D-shaped interventricular septum.    AKI earlier in 7/20 in setting of excessive torsemide, she had increased dose to 60 qam/40 qpm on her own.  This was cut back to 40 mg daily and losartan and spironolactone were stopped.   Echo in 9/20 showed EF 45% with mildly decreased RV systolic function, PASP 46 mmHg with dilated IVC.   She returns today for followup of CHF.  Weight down 3 lbs. Overall doing better.  No significant exertional dyspnea, walks laps around house.  Uses walker.  No problems walking into the office.  No chest pain.  No lightheadedness.  BP high today but runs 110s-120s at home.   REDS clip 30%     Labs (12/18): K 3.9, Creatinine 1.65 Labs (5/19): K 4.1, creatinine 1.7 Labs (6/19): hgb 8.2 Labs (8/19): K 4.3, creatinine 1.73 Labs (9/19): K 4, creatinine 1.56 Labs (11/19): K 4.7, creatinine 2.04 Labs (12/19): creatinine 1.44 Labs (7/20): K 5, creatinine 2.49 => 1.86, Hgb 11.1 Labs (8/20): K 3.9, creatinine 1.79 Labs (9/20): LDL 50 Labs (11/20): K 3.8, creatinine 2.01  ECG (personally reviewed): atrial fibrillation, poor RWP  Review of systems complete and found to be negative unless listed in HPI.    Past Medical History 1. Chronic systolic CHF: Ischemic cardiomyopathy with  prominent RV failure.   - Echo (11/18): LVEF 35-40%, severe RV dilation, severe RAE, severe TR.  - Echo (7/19): EF 45-50%, diffuse hypokinesis, mild RV dilation with mildly decreased RV systolic function, D-shaped interventricular septum suggestive of RV pressure/volume overload, mild MR, moderate TR, PASP 49 mmhg.  - Echo (9/20): EF 45%, mild LV dilation, mild RV dilation with mildly decreased systolic function, PASP 46 mmHg, IVC dilated.  2. CAD: CABG 2009. No angiography since that time.   3. HTN 4. Atrial fibrillation: Chronic since 2013.  5. CKD stage 3 6. Morbid obesity 7. OSA 8. Chronic venous stasis with RLE wound/Bullae 9. CVA 5/18.   Current Outpatient Medications  Medication Sig Dispense Refill  . acetaminophen (TYLENOL 8 HOUR) 650 MG CR tablet Take 1 tablet (650 mg total) by mouth every 8 (eight) hours as needed for pain. 30 tablet 0  . apixaban (ELIQUIS) 5 MG TABS tablet Take 1 tablet (5 mg total) by mouth 2 (two) times daily. 60 tablet 11  . atorvastatin (LIPITOR) 40 MG tablet TAKE 1 TABLET BY MOUTH  DAILY AT 6 PM. 90 tablet 3  . carvedilol (COREG) 6.25 MG tablet TAKE 1 TABLET BY MOUTH  TWICE DAILY WITH A MEAL 60 tablet 11  . cholecalciferol (VITAMIN D) 1000 UNITS tablet Take 1,000 Units by mouth daily.     . diclofenac Sodium (VOLTAREN) 1 % GEL Apply 2 g topically 4 (four) times daily. 50 g 0  . ferrous sulfate 325 (65 FE) MG tablet Take 1 tablet (325 mg total) by mouth daily  with breakfast. 60 tablet 3  . glucose blood (ONE TOUCH ULTRA TEST) test strip Use three times a day 100 each 3  . Insulin Glargine (LANTUS SOLOSTAR) 100 UNIT/ML Solostar Pen Inject 12 Units into the skin daily. Per sliding scale. 15 mL 0  . Insulin Pen Needle (PEN NEEDLES) 32G X 4 MM MISC 1 each by Does not apply route at bedtime. Use to inject Lantus each night. 90 each 12  . Lancets (ONETOUCH DELICA PLUS LNLGXQ11H) MISC 1 application by Does not apply route daily. 100 each 6  . potassium chloride SA  (K-DUR) 20 MEQ tablet Take 2 tablets (40 mEq total) by mouth every morning AND 1 tablet (20 mEq total) every evening. 60 tablet 11  . rOPINIRole (REQUIP) 0.5 MG tablet TAKE 2 TABLETS BY MOUTH AT  BEDTIME 180 tablet 3  . torsemide (DEMADEX) 20 MG tablet Take 80mg  in the AM and 40mg  in the PM 180 tablet 3  . vitamin B-12 (CYANOCOBALAMIN) 1000 MCG tablet Take 1 tablet (1,000 mcg total) by mouth daily. 90 tablet 0  . nitroGLYCERIN (NITROSTAT) 0.4 MG SL tablet Place 1 tablet (0.4 mg total) under the tongue every 5 (five) minutes as needed for chest pain (up to 3 doses). (Patient not taking: Reported on 12/18/2019) 15 tablet 5   No current facility-administered medications for this encounter.   Allergies  Allergen Reactions  . Benazepril Other (See Comments)    Unknown reaction at age 54-65 - possibly dizziness  . Angiotensin Receptor Blockers Other (See Comments)    Hypotension reaction  . Fe-Succ-C-Thre-B12-Des Stomach Other (See Comments)    Unknown reaction  . Sulfamethoxazole-Trimethoprim Other (See Comments)    Unknown reaction   Social History   Socioeconomic History  . Marital status: Widowed    Spouse name: Not on file  . Number of children: Not on file  . Years of education: Not on file  . Highest education level: Not on file  Occupational History  . Not on file  Tobacco Use  . Smoking status: Never Smoker  . Smokeless tobacco: Never Used  Substance and Sexual Activity  . Alcohol use: No  . Drug use: No  . Sexual activity: Not Currently  Other Topics Concern  . Not on file  Social History Narrative   Widowed   Lives alone in [place]   2 children   Not routinely exercising   Social Determinants of Health   Financial Resource Strain:   . Difficulty of Paying Living Expenses: Not on file  Food Insecurity:   . Worried About Charity fundraiser in the Last Year: Not on file  . Ran Out of Food in the Last Year: Not on file  Transportation Needs:   . Lack of  Transportation (Medical): Not on file  . Lack of Transportation (Non-Medical): Not on file  Physical Activity:   . Days of Exercise per Week: Not on file  . Minutes of Exercise per Session: Not on file  Stress:   . Feeling of Stress : Not on file  Social Connections:   . Frequency of Communication with Friends and Family: Not on file  . Frequency of Social Gatherings with Friends and Family: Not on file  . Attends Religious Services: Not on file  . Active Member of Clubs or Organizations: Not on file  . Attends Archivist Meetings: Not on file  . Marital Status: Not on file  Intimate Partner Violence:   . Fear of Current or  Ex-Partner: Not on file  . Emotionally Abused: Not on file  . Physically Abused: Not on file  . Sexually Abused: Not on file   Family History  Problem Relation Age of Onset  . Clotting disorder Mother        Cerebral hemorrhage  . Emphysema Father        COD  . Lung cancer Brother   . Heart disease Neg Hx    Vitals:   12/18/19 0948  BP: (!) 142/78  Pulse: 61  SpO2: 98%  Weight: 99.6 kg (219 lb 9.6 oz)     Wt Readings from Last 3 Encounters:  12/18/19 99.6 kg (219 lb 9.6 oz)  09/14/19 100.9 kg (222 lb 6.4 oz)  08/14/19 103.6 kg (228 lb 8 oz)    PHYSICAL EXAM: General: NAD Neck: No JVD, no thyromegaly or thyroid nodule.  Lungs: Clear to auscultation bilaterally with normal respiratory effort. CV: Nondisplaced PMI.  Heart irregular S1/S2, no S3/S4, no murmur.  No peripheral edema.  No carotid bruit.  Normal pedal pulses.  Abdomen: Soft, nontender, no hepatosplenomegaly, no distention.  Skin: Intact without lesions or rashes.  Neurologic: Alert and oriented x 3.  Psych: Normal affect. Extremities: No clubbing or cyanosis.  HEENT: Normal.   ASSESSMENT & PLAN:  1. Chronic systolic CHF with prominent RV failure: Ischemic cardiomyopathy.  Echo in 7/19 showed LV EF 45-50% (improved) with mildly dilated/mildly dysfunction RV.  D-shaped  septum suggested RV pressure/volume overload.  Echo in 9/20 showed EF 45%, mildly dysfunctional RV, PASP 46 mmHg with dilated IVC.  Weight is down.  She does not look volume overloaded by exam or REDS clip. NYHA class II.     - Continue torsemide 80 qam/40 qpm.  BMET today.  - Continue Coreg 6.25 mg bid.   - She is not on ACEI/ARB/spironolactone with elevated creatinine.  - We have discussed Cardiomems but she has not been admitted in the last year so cannot get.  2. CAD s/p CABG: No chest pain.  - Continue atorvastatin 40 mg daily. Good lipids in 9/20.    - No ASA with stable CAD on Eliquis.  3. Atrial fibrillation: Chronic. Given long-term atrial fibrillation, she is unlikely to successfully cardiovert.  - Continue Eliquis.   4. CKD stage 3: BMET today.   5. H/o CVA: Continue Eliquis.   6. OSA: Continue CPAP.  7. HTN: BP generally controlled at home.   Followup 3 mos  Loralie Champagne 12/20/2019

## 2019-12-21 ENCOUNTER — Telehealth: Payer: Self-pay | Admitting: Family Medicine

## 2019-12-21 ENCOUNTER — Other Ambulatory Visit: Payer: Self-pay | Admitting: Family Medicine

## 2019-12-21 MED ORDER — LANTUS SOLOSTAR 100 UNIT/ML ~~LOC~~ SOPN: 8.0000 [IU] | PEN_INJECTOR | Freq: Every day | SUBCUTANEOUS | 0 refills | Status: DC

## 2019-12-21 MED ORDER — GABAPENTIN 300 MG PO CAPS: 300.0000 mg | ORAL_CAPSULE | Freq: Two times a day (BID) | ORAL | 3 refills | Status: DC | PRN

## 2019-12-21 NOTE — Telephone Encounter (Signed)
[name] reports that her hemoglobin A1c has decreased from 5.9 to 5.2 at her last check a few days ago.  She is currently taking Lantus 12 units daily.  She has not had any low blood sugars on this regimen.  We decided to decrease her Lantus from 12 units to 8 units daily.  She also mentions that her hands have began to tingle and burn and feel numb frequently.  She says that she previously was on gabapentin and tolerated it well.  We will restart gabapentin 300 mg twice daily as needed, which is renally dosed for her GFR.  Patient's Lantus regimen was updated in her chart and gabapentin was sent to her pharmacy.

## 2019-12-21 NOTE — Telephone Encounter (Signed)
Pt called to let Dr. Shan Levans know that her nurse came and her A1C was 5.9, the last one was 5.2. Should the pt keep taking her insulin as she has been or should she change how much she is taking. Please advise. [phone]. Pt will do a virtual visit if one is needed. Ottis Stain, CMA

## 2019-12-23 ENCOUNTER — Other Ambulatory Visit: Payer: Self-pay

## 2019-12-23 ENCOUNTER — Ambulatory Visit: Payer: Medicare Other | Admitting: Orthotics

## 2019-12-23 DIAGNOSIS — M2012 Hallux valgus (acquired), left foot: Secondary | ICD-10-CM

## 2019-12-23 DIAGNOSIS — M2042 Other hammer toe(s) (acquired), left foot: Secondary | ICD-10-CM

## 2019-12-23 DIAGNOSIS — E1151 Type 2 diabetes mellitus with diabetic peripheral angiopathy without gangrene: Secondary | ICD-10-CM

## 2019-12-23 DIAGNOSIS — M2041 Other hammer toe(s) (acquired), right foot: Secondary | ICD-10-CM

## 2019-12-23 DIAGNOSIS — M19079 Primary osteoarthritis, unspecified ankle and foot: Secondary | ICD-10-CM

## 2019-12-23 DIAGNOSIS — M2011 Hallux valgus (acquired), right foot: Secondary | ICD-10-CM

## 2019-12-23 NOTE — Progress Notes (Signed)

## 2019-12-25 ENCOUNTER — Telehealth: Payer: Self-pay

## 2019-12-25 NOTE — Telephone Encounter (Signed)
Spoke with Dr. Shan Levans regarding patient's symptoms. Per provider, patient is to decrease frequency to once per day and can take medication at night before bed.   Called patient and informed. Advised patient to call the office back next week if symptoms still persist.   To PCP  Talbot Grumbling, RN

## 2019-12-25 NOTE — Telephone Encounter (Signed)
Pt calls nurse line regarding side effects of gabapentin. Patient reports dizziness after starting the medication. Educated on fall safety.   Text paged Dr. Shan Levans to advise if patient should continue taking medication at current dosage  To PCP  Talbot Grumbling, RN

## 2019-12-28 ENCOUNTER — Other Ambulatory Visit: Payer: Self-pay | Admitting: Family Medicine

## 2019-12-28 MED ORDER — GABAPENTIN 100 MG PO CAPS: 100.0000 mg | ORAL_CAPSULE | Freq: Every day | ORAL | 2 refills | Status: DC

## 2019-12-28 NOTE — Telephone Encounter (Signed)
Let's decrease the dose of gabapentin to 100 mg QHS.  I have sent the new prescription to her pharmacy.

## 2019-12-28 NOTE — Telephone Encounter (Signed)
Pt calls nurse line and reports that she is still having issues with dizziness while taking the gabapentin.   Please advise  Talbot Grumbling, RN

## 2019-12-28 NOTE — Telephone Encounter (Signed)
Pt informed of below.   Heidie Krall C Fulton Merry, RN  

## 2019-12-31 NOTE — Telephone Encounter (Signed)
Received message from [employer] stating that patient needs to spend $306.36 out of pocket on prescription drugs for 2021 before they will process her Eliquis re-enrollment application. Informed patient. She will provide [place] with out of pocket expense report from her pharmacy once she spends that amount. Will then fax report to [employer] so application will be reprocessed.   Audry Riles, PharmD, BCPS, BCCP, CPP Heart Failure Clinic Pharmacist [phone]

## 2020-01-20 ENCOUNTER — Telehealth: Payer: Self-pay | Admitting: *Deleted

## 2020-01-20 NOTE — Telephone Encounter (Signed)
Pt states that her podiatrist received a message from our office that she needed an appt before we would approve shoes.  Virtual appt made for Monday PM.  Will forward to PCP to see if that is ok, or if in person is needed. Christen Bame, CMA

## 2020-01-21 NOTE — Telephone Encounter (Signed)
Virtual visit is okay since I know Ms. Heide well.  Thank you!

## 2020-01-25 ENCOUNTER — Other Ambulatory Visit: Payer: Self-pay

## 2020-01-25 ENCOUNTER — Telehealth (INDEPENDENT_AMBULATORY_CARE_PROVIDER_SITE_OTHER): Payer: Medicare Other | Admitting: Family Medicine

## 2020-01-25 ENCOUNTER — Encounter: Payer: Self-pay | Admitting: Family Medicine

## 2020-01-25 DIAGNOSIS — E1142 Type 2 diabetes mellitus with diabetic polyneuropathy: Secondary | ICD-10-CM

## 2020-01-25 MED ORDER — DULOXETINE HCL 20 MG PO CPEP: 20.0000 mg | ORAL_CAPSULE | Freq: Every day | ORAL | 3 refills | Status: DC

## 2020-01-25 NOTE — Progress Notes (Signed)
[facility] Telemedicine Visit  Patient consented to have virtual visit. Method of visit: Telephone  Encounter participants: Patient: [name] - located at home Provider: Kathrene Alu - located at [facility] Others (if applicable): none  Chief Complaint: T2DM and diabetic neuropathy follow up  HPI: Neuropathy Continues to have tingling in her hands and feels like her balance is off.  Takes gabapentin before bed and gets up several times at night to urinate.  She is worried that gabapentin increases her sense of imbalance when she gets up at night.  Type 2 diabetes mellitus CBGs: 110-125 fasting, sometimes a little higher such as 140 when she eats starch for dinner Medication: Lantus 8 units once daily Hypo/hyperglycemia symptoms: None Diet and exercise: Tries to avoid starch and walk as much as she can tolerate. Complications: Has bunion deformities of bilateral feet with hammertoes.  Diabetic neuropathy as described above Last hemoglobin A1c measured by home health care in February 2021 was 5.2.    ROS: per HPI  Pertinent PMHx: Type 2 diabetes, congestive heart failure, diabetic neuropathy  Exam:  Respiratory: No cough or shortness of breath during our conversation  Assessment/Plan:  DM type 2 with diabetic peripheral neuropathy ([place]) Since patient's hemoglobin A1c is 5.2, she is 79 years old, and her blood sugars continue to be well controlled, we will stop her Lantus.  She will call me if her fasting blood sugars are consistently greater than 180.  We will also stop her gabapentin and start Cymbalta 20 mg nightly to see if this will improve her neuropathy without contributing to her sense of imbalance.    Time spent during visit with patient: 8 minutes

## 2020-01-25 NOTE — Assessment & Plan Note (Signed)
Since patient's hemoglobin A1c is 5.2, she is 79 years old, and her blood sugars continue to be well controlled, we will stop her Lantus.  She will call me if her fasting blood sugars are consistently greater than 180.  We will also stop her gabapentin and start Cymbalta 20 mg nightly to see if this will improve her neuropathy without contributing to her sense of imbalance.

## 2020-02-08 DIAGNOSIS — H353221 Exudative age-related macular degeneration, left eye, with active choroidal neovascularization: Secondary | ICD-10-CM | POA: Diagnosis not present

## 2020-02-08 DIAGNOSIS — H33102 Unspecified retinoschisis, left eye: Secondary | ICD-10-CM | POA: Diagnosis not present

## 2020-02-08 DIAGNOSIS — H353222 Exudative age-related macular degeneration, left eye, with inactive choroidal neovascularization: Secondary | ICD-10-CM | POA: Diagnosis not present

## 2020-02-08 DIAGNOSIS — H43822 Vitreomacular adhesion, left eye: Secondary | ICD-10-CM | POA: Diagnosis not present

## 2020-02-15 ENCOUNTER — Telehealth: Payer: Self-pay

## 2020-02-15 NOTE — Telephone Encounter (Signed)
Patient calls nurse line regarding blood sugar medication management. Patient reports that she has stopped taking Lantus, per provider instruction. However, patient says that her blood sugars have been slowly increasing over the last few weeks. Patient reports that her blood sugars have been ranging 160-170. Patient would like to know if she needs to restart Lantus and how much she should restart at if needed.   Patient also reports that she is still having issues with receiving her shoes from the [facility] specialist. Per patient, the office needs provider signature. Office is re-faxing over the required documentation.   To PCP  Please advise  Talbot Grumbling, RN

## 2020-02-19 NOTE — Telephone Encounter (Signed)
I spoke with [name] and told her that we will continue off of Lantus for now since her A1c is so well controlled.  We can always add something back on if her A1c increases significantly at the next check.  I have also sent in the new paperwork for her diabetic shoes as requested.

## 2020-02-20 ENCOUNTER — Other Ambulatory Visit: Payer: Self-pay | Admitting: Family Medicine

## 2020-02-29 ENCOUNTER — Other Ambulatory Visit: Payer: Self-pay

## 2020-02-29 ENCOUNTER — Ambulatory Visit (INDEPENDENT_AMBULATORY_CARE_PROVIDER_SITE_OTHER): Payer: Medicare Other | Admitting: Podiatry

## 2020-02-29 DIAGNOSIS — E1169 Type 2 diabetes mellitus with other specified complication: Secondary | ICD-10-CM | POA: Diagnosis not present

## 2020-02-29 DIAGNOSIS — B351 Tinea unguium: Secondary | ICD-10-CM

## 2020-02-29 DIAGNOSIS — E1151 Type 2 diabetes mellitus with diabetic peripheral angiopathy without gangrene: Secondary | ICD-10-CM

## 2020-02-29 NOTE — Progress Notes (Signed)
  Subjective:  Patient ID: [name], female    DOB: 1941/06/18,  MRN: [id number]  Chief Complaint  Patient presents with  . debride    diabetic nail trimming    79 y.o. female presents with the above complaint. History confirmed with patient.   Objective:  Physical Exam: warm, good capillary refill, nail exam onychomycosis of the toenails, no trophic changes or ulcerative lesions, normal DP and non-palp PT pulses, and normal sensory exam. Left Foot: bunion deformity noted and hammertoes  Right Foot: bunion deformity noted and hammertoes   No images are attached to the encounter.  Assessment:   1. Onychomycosis of multiple toenails with type 2 diabetes mellitus and peripheral angiopathy ([place])      Plan:  Patient was evaluated and treated and all questions answered.  Onychomycosis, Diabetes and PAD -Patient is diabetic with a qualifying condition for at risk foot care. -Pending DM Shoes   Procedure: Nail Debridement Rationale: Patient meets criteria for routine foot care due to PAD Type of Debridement: manual, sharp debridement. Instrumentation: Nail nipper, rotary burr. Number of Nails: 10    Return in about 3 months (around 05/30/2020) for Diabetic Foot Care.

## 2020-03-03 ENCOUNTER — Telehealth: Payer: Self-pay | Admitting: Podiatry

## 2020-03-03 NOTE — Telephone Encounter (Signed)
Pt left message stating she seen Dr March Rummage and he asked her how she was liking her diabetic shoes and she told him she had not gotten them yet.   I returned call and we are waiting on her pcp to sign the paperwork with the updated visit from march. I faxed it with a note on 4.5 and I have refaxed again today.

## 2020-03-16 ENCOUNTER — Ambulatory Visit (HOSPITAL_COMMUNITY)
Admission: RE | Admit: 2020-03-16 | Discharge: 2020-03-16 | Disposition: A | Discharge status: Home / Self Care | Payer: Medicare Other | Source: Ambulatory Visit | Attending: Cardiology | Admitting: Cardiology

## 2020-03-16 ENCOUNTER — Other Ambulatory Visit: Payer: Self-pay

## 2020-03-16 ENCOUNTER — Encounter (HOSPITAL_COMMUNITY): Payer: Self-pay | Admitting: Cardiology

## 2020-03-16 VITALS — BP 135/51 | HR 67 | Wt 212.0 lb

## 2020-03-16 DIAGNOSIS — Z7901 Long term (current) use of anticoagulants: Secondary | ICD-10-CM | POA: Diagnosis not present

## 2020-03-16 DIAGNOSIS — I272 Pulmonary hypertension, unspecified: Secondary | ICD-10-CM | POA: Insufficient documentation

## 2020-03-16 DIAGNOSIS — I2510 Atherosclerotic heart disease of native coronary artery without angina pectoris: Secondary | ICD-10-CM | POA: Insufficient documentation

## 2020-03-16 DIAGNOSIS — Z79899 Other long term (current) drug therapy: Secondary | ICD-10-CM | POA: Insufficient documentation

## 2020-03-16 DIAGNOSIS — N183 Chronic kidney disease, stage 3 unspecified: Secondary | ICD-10-CM | POA: Diagnosis not present

## 2020-03-16 DIAGNOSIS — I482 Chronic atrial fibrillation, unspecified: Secondary | ICD-10-CM | POA: Diagnosis not present

## 2020-03-16 DIAGNOSIS — I4819 Other persistent atrial fibrillation: Secondary | ICD-10-CM

## 2020-03-16 DIAGNOSIS — E1122 Type 2 diabetes mellitus with diabetic chronic kidney disease: Secondary | ICD-10-CM | POA: Insufficient documentation

## 2020-03-16 DIAGNOSIS — I5022 Chronic systolic (congestive) heart failure: Secondary | ICD-10-CM | POA: Insufficient documentation

## 2020-03-16 DIAGNOSIS — Z8673 Personal history of transient ischemic attack (TIA), and cerebral infarction without residual deficits: Secondary | ICD-10-CM | POA: Insufficient documentation

## 2020-03-16 DIAGNOSIS — G4733 Obstructive sleep apnea (adult) (pediatric): Secondary | ICD-10-CM | POA: Insufficient documentation

## 2020-03-16 DIAGNOSIS — Z951 Presence of aortocoronary bypass graft: Secondary | ICD-10-CM | POA: Insufficient documentation

## 2020-03-16 DIAGNOSIS — I255 Ischemic cardiomyopathy: Secondary | ICD-10-CM | POA: Insufficient documentation

## 2020-03-16 DIAGNOSIS — Z794 Long term (current) use of insulin: Secondary | ICD-10-CM | POA: Diagnosis not present

## 2020-03-16 DIAGNOSIS — I130 Hypertensive heart and chronic kidney disease with heart failure and stage 1 through stage 4 chronic kidney disease, or unspecified chronic kidney disease: Secondary | ICD-10-CM | POA: Insufficient documentation

## 2020-03-16 LAB — CBC
HCT: 39.6 % (ref 36.0–46.0)
Hemoglobin: 11.8 g/dL — ABNORMAL LOW (ref 12.0–15.0)
MCH: 30.3 pg (ref 26.0–34.0)
MCHC: 29.8 g/dL — ABNORMAL LOW (ref 30.0–36.0)
MCV: 101.8 fL — ABNORMAL HIGH (ref 80.0–100.0)
Platelets: 139 10*3/uL — ABNORMAL LOW (ref 150–400)
RBC: 3.89 MIL/uL (ref 3.87–5.11)
RDW: 14.3 % (ref 11.5–15.5)
WBC: 5.3 10*3/uL (ref 4.0–10.5)
nRBC: 0 % (ref 0.0–0.2)

## 2020-03-16 LAB — BASIC METABOLIC PANEL
Anion gap: 9 (ref 5–15)
BUN: 63 mg/dL — ABNORMAL HIGH (ref 8–23)
CO2: 24 mmol/L (ref 22–32)
Calcium: 9.7 mg/dL (ref 8.9–10.3)
Chloride: 105 mmol/L (ref 98–111)
Creatinine, Ser: 1.62 mg/dL — ABNORMAL HIGH (ref 0.44–1.00)
GFR calc Af Amer: 34 mL/min — ABNORMAL LOW (ref >60)
GFR calc non Af Amer: 30 mL/min — ABNORMAL LOW (ref >60)
Glucose, Bld: 158 mg/dL — ABNORMAL HIGH (ref 70–99)
Potassium: 4.5 mmol/L (ref 3.5–5.1)
Sodium: 138 mmol/L (ref 135–145)

## 2020-03-16 NOTE — Patient Instructions (Addendum)
No medication changes!  Labs today We will only contact you if something comes back abnormal or we need to make some changes. Otherwise no news is good news!  Your physician recommends that you schedule a follow-up appointment in: 4 months with Dr Aundra Dubin  Please call office at [phone] option 2 if you have any questions or concerns.   At the [facility], you and your health needs are our priority. As part of our continuing mission to provide you with exceptional heart care, we have created designated Provider Care Teams. These Care Teams include your primary Cardiologist (physician) and Advanced Practice Providers (APPs- Physician Assistants and Nurse Practitioners) who all work together to provide you with the care you need, when you need it.   You may see any of the following providers on your designated Care Team at your next follow up: Marland Kitchen Dr Glori Bickers . Dr Loralie Champagne . Darrick Grinder, NP . Lyda Jester, PA . Audry Riles, PharmD   Please be sure to bring in all your medications bottles to every appointment.

## 2020-03-16 NOTE — Progress Notes (Signed)
Advanced Heart Failure Clinic Note   Primary Care: Dr. Shan Levans Primary Cardiologist: Dr. Angelena Form HF Cardiology: Dr. Aundra Dubin  HPI:  [name] is a 79 y.o. female with history of CAD status post CABG 2009, ischemic cardiomyopathy with chronic systolic CHF, persistent atrial fibrillation with stroke in 03/2016 when INR was subtherapeutic, stage III CKD, morbid obesity, pulmonary hypertension, diabetes, GI bleed 03/2017, and severe sleep apnea.   Seen in [facility] clinics 09/10/18 and 09/24/18, both times with volume overload and marked peripheral edema with skin breakdown. Torsemide increased at each visit. Echo in 7/19 showed EF 45-50% with mild RV dilation/mild RV systolic dysfunction and D-shaped interventricular septum.    AKI earlier in 7/20 in setting of excessive torsemide, she had increased dose to 60 qam/40 qpm on her own.  This was cut back to 40 mg daily and losartan and spironolactone were stopped.   Echo in 9/20 showed EF 45% with mildly decreased RV systolic function, PASP 46 mmHg with dilated IVC.   She returns today for followup of CHF.  Weight is down 7 lbs. She is using a rolling walker.  She walks 50-80 laps/day around her house.  She is doing better symptomatically.  No dyspnea walking on flat ground as long as she has her walker.  She is using CPAP.  She has been elevating the head of her bed chronically.  No chest pain.  No lightheadedness.  SBP running 110s-120s at home.   Labs (12/18): K 3.9, Creatinine 1.65 Labs (5/19): K 4.1, creatinine 1.7 Labs (6/19): hgb 8.2 Labs (8/19): K 4.3, creatinine 1.73 Labs (9/19): K 4, creatinine 1.56 Labs (11/19): K 4.7, creatinine 2.04 Labs (12/19): creatinine 1.44 Labs (7/20): K 5, creatinine 2.49 => 1.86, Hgb 11.1 Labs (8/20): K 3.9, creatinine 1.79 Labs (9/20): LDL 50 Labs (11/20): K 3.8, creatinine 2.01 Labs (2/21): K 4, creatinine 1.9  Review of systems complete and found to be negative unless listed in HPI.    Past Medical  History 1. Chronic systolic CHF: Ischemic cardiomyopathy with prominent RV failure.   - Echo (11/18): LVEF 35-40%, severe RV dilation, severe RAE, severe TR.  - Echo (7/19): EF 45-50%, diffuse hypokinesis, mild RV dilation with mildly decreased RV systolic function, D-shaped interventricular septum suggestive of RV pressure/volume overload, mild MR, moderate TR, PASP 49 mmhg.  - Echo (9/20): EF 45%, mild LV dilation, mild RV dilation with mildly decreased systolic function, PASP 46 mmHg, IVC dilated.  2. CAD: CABG 2009. No angiography since that time.   3. HTN 4. Atrial fibrillation: Chronic since 2013.  5. CKD stage 3 6. Morbid obesity 7. OSA 8. Chronic venous stasis with RLE wound/[place] 9. CVA 5/18.   Current Outpatient Medications  Medication Sig Dispense Refill  . acetaminophen (TYLENOL 8 HOUR) 650 MG CR tablet Take 1 tablet (650 mg total) by mouth every 8 (eight) hours as needed for pain. 30 tablet 0  . apixaban (ELIQUIS) 5 MG TABS tablet Take 1 tablet (5 mg total) by mouth 2 (two) times daily. 60 tablet 11  . atorvastatin (LIPITOR) 40 MG tablet TAKE 1 TABLET BY MOUTH  DAILY AT 6 PM. 90 tablet 3  . carvedilol (COREG) 6.25 MG tablet TAKE 1 TABLET BY MOUTH  TWICE DAILY WITH A MEAL 60 tablet 11  . cholecalciferol (VITAMIN D) 1000 UNITS tablet Take 1,000 Units by mouth daily.     . diclofenac Sodium (VOLTAREN) 1 % GEL Apply 2 g topically 4 (four) times daily. 50 g 0  .  DULoxetine (CYMBALTA) 20 MG capsule Take 1 capsule (20 mg total) by mouth daily. 30 capsule 3  . ferrous sulfate 325 (65 FE) MG tablet Take 1 tablet (325 mg total) by mouth daily with breakfast. 60 tablet 3  . glucose blood (ONE TOUCH ULTRA TEST) test strip Use three times a day 100 each 3  . Insulin Glargine (LANTUS SOLOSTAR) 100 UNIT/ML Solostar Pen Inject 8 Units into the skin daily. Per sliding scale. 15 mL 0  . Insulin Pen Needle (PEN NEEDLES) 32G X 4 MM MISC 1 each by Does not apply route at bedtime. Use to inject  Lantus each night. 90 each 12  . Lancets (ONETOUCH DELICA PLUS [id number]) MISC 1 application by Does not apply route daily. 100 each 6  . nitroGLYCERIN (NITROSTAT) 0.4 MG SL tablet Place 1 tablet (0.4 mg total) under the tongue every 5 (five) minutes as needed for chest pain (up to 3 doses). 15 tablet 5  . potassium chloride SA (KLOR-CON) 20 MEQ tablet TAKE 2 TABLETS BY MOUTH  DAILY 180 tablet 3  . rOPINIRole (REQUIP) 0.5 MG tablet TAKE 2 TABLETS BY MOUTH AT  BEDTIME 180 tablet 3  . torsemide (DEMADEX) 20 MG tablet Take 80mg  in the AM and 40mg  in the PM 180 tablet 3  . vitamin B-12 (CYANOCOBALAMIN) 1000 MCG tablet Take 1 tablet (1,000 mcg total) by mouth daily. 90 tablet 0   No current facility-administered medications for this encounter.   Allergies  Allergen Reactions  . Benazepril Other (See Comments)    Unknown reaction at age 69-65 - possibly dizziness  . Angiotensin Receptor Blockers Other (See Comments)    Hypotension reaction  . Fe-Succ-C-Thre-B12-Des Stomach Other (See Comments)    Unknown reaction  . Sulfamethoxazole-Trimethoprim Other (See Comments)    Unknown reaction   Social History   Socioeconomic History  . Marital status: Widowed    Spouse name: Not on file  . Number of children: Not on file  . Years of education: Not on file  . Highest education level: Not on file  Occupational History  . Not on file  Tobacco Use  . Smoking status: Never Smoker  . Smokeless tobacco: Never Used  Substance and Sexual Activity  . Alcohol use: No  . Drug use: No  . Sexual activity: Not Currently  Other Topics Concern  . Not on file  Social History Narrative   Widowed   Lives alone in [place]   2 children   Not routinely exercising   Social Determinants of Health   Financial Resource Strain:   . Difficulty of Paying Living Expenses:   Food Insecurity:   . Worried About Charity fundraiser in the Last Year:   . Arboriculturist in the Last Year:   Transportation  Needs:   . Film/video editor (Medical):   Marland Kitchen Lack of Transportation (Non-Medical):   Physical Activity:   . Days of Exercise per Week:   . Minutes of Exercise per Session:   Stress:   . Feeling of Stress :   Social Connections:   . Frequency of Communication with Friends and Family:   . Frequency of Social Gatherings with Friends and Family:   . Attends Religious Services:   . Active Member of Clubs or Organizations:   . Attends Archivist Meetings:   Marland Kitchen Marital Status:   Intimate Partner Violence:   . Fear of Current or Ex-Partner:   . Emotionally Abused:   Marland Kitchen Physically  Abused:   . Sexually Abused:    Family History  Problem Relation Age of Onset  . Clotting disorder Mother        Cerebral hemorrhage  . Emphysema Father        COD  . Lung cancer Brother   . Heart disease Neg Hx    Vitals:   03/16/20 1003  BP: (!) 135/51  Pulse: 67  SpO2: 100%  Weight: 96.2 kg (212 lb)     Wt Readings from Last 3 Encounters:  03/16/20 96.2 kg (212 lb)  01/25/20 97.5 kg (215 lb)  12/18/19 99.6 kg (219 lb 9.6 oz)    PHYSICAL EXAM: General: NAD Neck: No JVD, no thyromegaly or thyroid nodule.  Lungs: Clear to auscultation bilaterally with normal respiratory effort. CV: Nondisplaced PMI.  Heart irregular S1/S2, no S3/S4, no murmur.  No peripheral edema.  No carotid bruit.  Normal pedal pulses.  Abdomen: Soft, nontender, no hepatosplenomegaly, no distention.  Skin: Intact without lesions or rashes.  Neurologic: Alert and oriented x 3.  Psych: Normal affect. Extremities: No clubbing or cyanosis.  HEENT: Normal.   ASSESSMENT & PLAN:  1. Chronic systolic CHF with prominent RV failure: Ischemic cardiomyopathy.  Echo in 7/19 showed LV EF 45-50% (improved) with mildly dilated/mildly dysfunction RV.  D-shaped septum suggested RV pressure/volume overload.  Echo in 9/20 showed EF 45%, mildly dysfunctional RV, PASP 46 mmHg with dilated IVC.  Weight is down.  She does not look  volume overloaded by exam. NYHA class II.   She is walking more and exercise tolerance is improving.  - Continue torsemide 80 qam/40 qpm.  BMET today.  - Continue Coreg 6.25 mg bid.   - She is not on ACEI/ARB/spironolactone with elevated creatinine.  - We have discussed Cardiomems but she has not been admitted in the last year so cannot get.  2. CAD s/p CABG: No chest pain.  - Continue atorvastatin 40 mg daily. Good lipids in 9/20.    - No ASA with stable CAD on Eliquis.  3. Atrial fibrillation: Chronic. Given long-term atrial fibrillation, she is unlikely to successfully cardiovert.  - Continue Eliquis.   4. CKD stage 3: BMET today.   5. H/o CVA: Continue Eliquis.   6. OSA: Continue CPAP.  7. HTN: BP controlled.    Followup 4 mos  Loralie Champagne 03/16/2020

## 2020-03-23 ENCOUNTER — Other Ambulatory Visit: Payer: Self-pay

## 2020-03-23 ENCOUNTER — Ambulatory Visit (INDEPENDENT_AMBULATORY_CARE_PROVIDER_SITE_OTHER): Payer: Medicare Other | Admitting: Family Medicine

## 2020-03-23 DIAGNOSIS — E1142 Type 2 diabetes mellitus with diabetic polyneuropathy: Secondary | ICD-10-CM

## 2020-03-23 NOTE — Progress Notes (Signed)
    SUBJECTIVE:   CHIEF COMPLAINT / HPI:   Type 2 Diabetes Mellitus: CBGs: 160s-170s today Medications: none, stopped insulin due to A1c of 5.2 in February and age Hypo/hyperglycemic symptoms: none Diet and exercise: walks about 80 or 90 laps around her house per day   PERTINENT  PMH / PSH: CHF, T2DM, CKD  OBJECTIVE:   BP 127/70   Pulse 74   Ht 5\' 8"  (1.727 m)   Wt 208 lb 6.4 oz (94.5 kg)   LMP  (LMP Unknown)   SpO2 98%   BMI 31.69 kg/m   General: well appearing, appears stated age Cardiac: irregularly irregular rhythm, no MRG Respiratory: CTAB, no rhonchi, rales, or wheezing, normal work of breathing Skin: no rashes or other lesions, warm and well perfused Psych: appropriate mood and affect   ASSESSMENT/PLAN:   DM type 2 with diabetic peripheral neuropathy [facility]) Although Ms. Widger's sugars are elevated, her age and low hemoglobin A1c of 5.2 in February (not in our records since taken by her home health agency) indicate that she can continue to be off of insulin.  It is possible that her low hemoglobin A1c's could be due in part to her CKD and the lower rate of excretion of insulin when she was taking it a few months ago.  After discussion with the patient, she elected to check her A1c 6 months after it was previously checked in February.  If it is above 8 at that time, she may benefit from the addition of an GLP-1 agonist.  She would be agreeable to that addition if it is needed.     Kathrene Alu, MD [place]

## 2020-03-23 NOTE — Assessment & Plan Note (Addendum)
Although [name]'s sugars are elevated, her age and low hemoglobin A1c of 5.2 in February (not in our records since taken by her home health agency) indicate that she can continue to be off of insulin.  It is possible that her low hemoglobin A1c's could be due in part to her CKD and the lower rate of excretion of insulin when she was taking it a few months ago.  After discussion with the patient, she elected to check her A1c 6 months after it was previously checked in February.  If it is above 8 at that time, she may benefit from the addition of an GLP-1 agonist.  She would be agreeable to that addition if it is needed.

## 2020-03-29 ENCOUNTER — Encounter (INDEPENDENT_AMBULATORY_CARE_PROVIDER_SITE_OTHER): Payer: Self-pay | Admitting: Ophthalmology

## 2020-03-29 ENCOUNTER — Ambulatory Visit (INDEPENDENT_AMBULATORY_CARE_PROVIDER_SITE_OTHER): Payer: Medicare Other | Admitting: Ophthalmology

## 2020-03-29 ENCOUNTER — Other Ambulatory Visit: Payer: Self-pay

## 2020-03-29 DIAGNOSIS — H353221 Exudative age-related macular degeneration, left eye, with active choroidal neovascularization: Secondary | ICD-10-CM | POA: Insufficient documentation

## 2020-03-29 DIAGNOSIS — H353222 Exudative age-related macular degeneration, left eye, with inactive choroidal neovascularization: Secondary | ICD-10-CM | POA: Diagnosis not present

## 2020-03-29 DIAGNOSIS — H43822 Vitreomacular adhesion, left eye: Secondary | ICD-10-CM

## 2020-03-29 DIAGNOSIS — H353211 Exudative age-related macular degeneration, right eye, with active choroidal neovascularization: Secondary | ICD-10-CM | POA: Diagnosis not present

## 2020-03-29 DIAGNOSIS — H33102 Unspecified retinoschisis, left eye: Secondary | ICD-10-CM | POA: Diagnosis not present

## 2020-03-29 MED ORDER — BEVACIZUMAB CHEMO INJECTION 1.25MG/0.05ML SYRINGE FOR KALEIDOSCOPE
1.2500 mg | INTRAVITREAL | Status: AC | PRN
  Administered 2020-03-29: 1.25 mg via INTRAVITREAL

## 2020-03-29 NOTE — Progress Notes (Signed)
03/29/2020     CHIEF COMPLAINT Patient presents for Retina Follow Up   HISTORY OF PRESENT ILLNESS: [name] is a 79 y.o. female who presents to the clinic today for:   HPI    Retina Follow Up    Patient presents with  Wet AMD.  In both eyes.  This started 2 months ago.  Severity is moderate.  Duration of 2 months.  Since onset it is gradually worsening.          Comments    WIP Decreased Near [facility] OU  Pt c/o decreased near [place] gradually over time. Pt sts she can't read the newspaper OU. Pt c/o "gritty" sensation off and on OU. No other new symptoms reported OU. Pt c/o intermittent diplopia OU. LBS: 159 this AM A1c: 5.2, 01/2020       Last edited by [name], [place] on 03/29/2020  8:32 AM. (History)      Referring physician: Kathrene Alu, MD [address],  [place] [id number]  HISTORICAL INFORMATION:   Selected notes from the MEDICAL RECORD NUMBER    Lab Results  Component Value Date   HGBA1C 5.9 11/28/2018     CURRENT MEDICATIONS: No current outpatient medications on file. (Ophthalmic Drugs)   No current facility-administered medications for this visit. (Ophthalmic Drugs)   Current Outpatient Medications (Other)  Medication Sig  . acetaminophen (TYLENOL 8 HOUR) 650 MG CR tablet Take 1 tablet (650 mg total) by mouth every 8 (eight) hours as needed for pain.  Marland Kitchen apixaban (ELIQUIS) 5 MG TABS tablet Take 1 tablet (5 mg total) by mouth 2 (two) times daily.  Marland Kitchen atorvastatin (LIPITOR) 40 MG tablet TAKE 1 TABLET BY MOUTH  DAILY AT 6 PM.  . carvedilol (COREG) 6.25 MG tablet TAKE 1 TABLET BY MOUTH  TWICE DAILY WITH A MEAL  . cholecalciferol (VITAMIN D) 1000 UNITS tablet Take 1,000 Units by mouth daily.   . diclofenac Sodium (VOLTAREN) 1 % GEL Apply 2 g topically 4 (four) times daily.  . DULoxetine (CYMBALTA) 20 MG capsule Take 1 capsule (20 mg total) by mouth daily.  . ferrous sulfate 325 (65 FE) MG tablet Take 1 tablet (325 mg total) by mouth  daily with breakfast.  . glucose blood (ONE TOUCH ULTRA TEST) test strip Use three times a day  . Insulin Pen Needle (PEN NEEDLES) 32G X 4 MM MISC 1 each by Does not apply route at bedtime. Use to inject Lantus each night.  . Lancets (ONETOUCH DELICA PLUS [id number]) MISC 1 application by Does not apply route daily.  . nitroGLYCERIN (NITROSTAT) 0.4 MG SL tablet Place 1 tablet (0.4 mg total) under the tongue every 5 (five) minutes as needed for chest pain (up to 3 doses).  . potassium chloride SA (KLOR-CON) 20 MEQ tablet TAKE 2 TABLETS BY MOUTH  DAILY  . rOPINIRole (REQUIP) 0.5 MG tablet TAKE 2 TABLETS BY MOUTH AT  BEDTIME  . torsemide (DEMADEX) 20 MG tablet Take 80mg  in the AM and 40mg  in the PM  . vitamin B-12 (CYANOCOBALAMIN) 1000 MCG tablet Take 1 tablet (1,000 mcg total) by mouth daily.   No current facility-administered medications for this visit. (Other)      REVIEW OF SYSTEMS:    ALLERGIES Allergies  Allergen Reactions  . Benazepril Other (See Comments)    Unknown reaction at age 68-65 - possibly dizziness  . Angiotensin Receptor Blockers Other (See Comments)    Hypotension reaction  . Fe-Succ-C-Thre-B12-Des  Stomach Other (See Comments)    Unknown reaction  . Sulfamethoxazole-Trimethoprim Other (See Comments)    Unknown reaction    PAST MEDICAL HISTORY Past Medical History:  Diagnosis Date  . ACC/AHA stage C congestive heart failure due to ischemic cardiomyopathy ([place]) 01/31/2009   Qualifier: Diagnosis of  By: Olevia Perches, MD, Glenetta Hew   . ANXIETY 01/31/2009   Qualifier: Diagnosis of  By: Lovette Cliche, CNA, Christy    . Arthritis    "qwhere" (03/30/2016)  . Benign essential HTN   . CAD (coronary artery disease) 05/2008   a. s/p CABG in 2009.  Marland Kitchen Cerebrovascular accident (CVA) due to embolism of right middle cerebral artery ([place])   . Chronic anticoagulation   . Chronic kidney disease (CKD), stage III (moderate)   . Chronic lower back pain   . Chronic pain syndrome   .  Chronic systolic CHF (congestive heart failure) ([place])   . DM type 2 with diabetic peripheral neuropathy ([place])   . Facial weakness, post-stroke   . Gastrointestinal hemorrhage 03/26/2017  . Hemiparesis ([place])   . History of CVA with residual deficit 03/26/2017  . Hyperlipidemia   . Hypertension   . Ischemic cardiomyopathy   . Longstanding persistent atrial fibrillation ([place])    a. Postoperative in 2009;  S/P transesophageal echocardiography-guided cardioversion, previously on Amiodarone and Coumadin. b. recurrent in April 2013 -> pt elected rate control strategy, initially Coumadin -> had stroke with subtherapeutic INR in 03/2016 and transitioned to Eliquis.  . Morbid obesity ([place]) 03/26/2017  . OSA on CPAP    severe OSA with AHI 34/hr  . Persistent atrial fibrillation ([place])   . Thrombocytopenia ([place])    Past Surgical History:  Procedure Laterality Date  . APPENDECTOMY  1946  . CATARACT EXTRACTION Right 2012  . COLONOSCOPY WITH PROPOFOL Left 03/27/2017   Procedure: COLONOSCOPY WITH PROPOFOL;  Surgeon: Ronnette Juniper, MD;  Location: [facility];  Service: Gastroenterology;  Laterality: Left;  . CORONARY ANGIOPLASTY WITH STENT PLACEMENT  2009   "put 2 stents in"  . HERNIA REPAIR    . IR LUMBAR [place] W/IMG GUIDE  10/08/2018  . JOINT REPLACEMENT    . TEE WITH CARDIOVERSION     Postoperative in 2009;  S/P transesophageal echocardiography-guided cardioversion,  . TOTAL KNEE ARTHROPLASTY Right 1994  . VENTRAL HERNIA REPAIR  10/1999   With mesh    FAMILY HISTORY Family History  Problem Relation Age of Onset  . Clotting disorder Mother        Cerebral hemorrhage  . Emphysema Father        COD  . Lung cancer Brother   . Heart disease Neg Hx     SOCIAL HISTORY Social History   Tobacco Use  . Smoking status: Never Smoker  . Smokeless tobacco: Never Used  Substance Use Topics  . Alcohol use: No  . Drug use: No         OPHTHALMIC EXAM:  Base Eye Exam    Visual Acuity  (ETDRS)      Right Left   Dist cc 20/30 -2 20/25 +2   Dist ph cc NI    Correction: Glasses       Tonometry (Tonopen, 8:36 AM)      Right Left   Pressure 12 10       Pupils      Pupils Dark Light Shape React APD   Right PERRL 3 2 Round Slow None   Left PERRL 3 2 Round Slow None  Visual Fields (Counting fingers)      Left Right    Full Full       Extraocular Movement      Right Left    Full Full       Neuro/Psych    Oriented x3: Yes   Mood/Affect: Normal       Dilation    Both eyes: 1.0% Mydriacyl, 2.5% Phenylephrine @ 8:36 AM        Slit Lamp and Fundus Exam    External Exam      Right Left   External Normal Normal       Slit Lamp Exam      Right Left   Lids/Lashes Normal Normal   Conjunctiva/Sclera White and quiet White and quiet   Cornea Clear Clear   Anterior Chamber Deep and quiet Deep and quiet   Iris Round and reactive Round and reactive   Lens Posterior chamber intraocular lens Posterior chamber intraocular lens   Anterior Vitreous Normal Normal       Fundus Exam      Right Left   Posterior Vitreous Posterior vitreous detachment Posterior vitreous detachment   Disc Normal Normal   C/D Ratio 0.5 0.5   Macula Soft drusen, Retinal pigment epithelial mottling, no macular thickening Soft drusen, no macular thickening,, no macular hole, negative [place]   Vessels Normal Normal   Periphery Normal Normal          IMAGING AND PROCEDURES  Imaging and Procedures for 03/29/20  OCT, Retina - OU - Both Eyes       Right Eye Quality was good. Scan locations included subfoveal. Central Foveal Thickness: 226. Progression has worsened. Findings include abnormal foveal contour, retinal drusen , cystoid macular edema.   Left Eye Quality was good. Scan locations included subfoveal. Central Foveal Thickness: 307. Progression has been stable. Findings include abnormal foveal contour, retinal drusen .   Notes OD, while the fovea is clear of disease,  there is new onset outer retinal CME in a diffuse pattern inferior portion of the macula.  This is not present in OCT findings from 1 year ago but began to become present in February 2021 and now much worse.  This is wet ARMD and shall be treated OD new onset inferior to the foveal region       Intravitreal Injection, Pharmacologic Agent - OD - Right Eye       Time Out 03/29/2020. 9:48 AM. Confirmed correct patient, procedure, site, and patient consented.   Anesthesia Topical anesthesia was used.   Procedure Preparation included Tobramycin 0.3%, 10% betadine to eyelids. A 30 gauge needle was used.   Injection:  1.25 mg Bevacizumab (AVASTIN) SOLN   NDC: [address]-9, Lot: [id number]   Route: Intravitreal, Site: Right Eye, Waste: 0 mg  Post-op Post injection exam found visual acuity of at least counting fingers. The patient tolerated the procedure well. There were no complications. The patient received written and verbal post procedure care education. Post injection medications were not given.                 ASSESSMENT/PLAN:  No problem-specific Assessment & Plan notes found for this encounter.      ICD-10-CM   1. Exudative age-related macular degeneration of right eye with active choroidal neovascularization (HCC)  H35.3211 Intravitreal Injection, Pharmacologic Agent - OD - Right Eye    Bevacizumab (AVASTIN) SOLN 1.25 mg  2. Exudative age-related macular degeneration of left eye with inactive choroidal neovascularization ([place])  H35.3222 OCT, Retina - OU - Both Eyes  3. Exudative age-related macular degeneration of left eye with active choroidal neovascularization ([place])  H35.3221   4. Vitreomacular adhesion of left eye  H43.822   5. Left retinoschisis  H33.102     1.This is wet ARMD and shall be treated OD new onset inferior to the foveal region  2.  3.  Ophthalmic Meds Ordered this visit:  Meds ordered this encounter  Medications  . Bevacizumab (AVASTIN) SOLN 1.25  mg       Return in about 5 weeks (around 05/03/2020) for dilate, OD, AVASTIN OCT.  There are no Patient Instructions on file for this visit.   Explained the diagnoses, plan, and follow up with the patient and they expressed understanding.  Patient expressed understanding of the importance of proper follow up care.   Clent Demark Alura Olveda M.D. Diseases & Surgery of the Retina and Vitreous [facility]ina [facility] Diabetic [place] 03/29/20     Abbreviations: M myopia (nearsighted); A astigmatism; H hyperopia (farsighted); P presbyopia; Mrx spectacle prescription;  CTL contact lenses; OD right eye; OS left eye; OU both eyes  XT exotropia; ET esotropia; PEK punctate epithelial keratitis; PEE punctate epithelial erosions; DES dry eye syndrome; MGD meibomian gland dysfunction; ATs artificial tears; PFAT's preservative free artificial tears; [place] nuclear sclerotic cataract; PSC posterior subcapsular cataract; ERM epi-retinal membrane; PVD posterior vitreous detachment; RD retinal detachment; DM diabetes mellitus; DR diabetic retinopathy; NPDR non-proliferative diabetic retinopathy; PDR proliferative diabetic retinopathy; CSME clinically significant macular edema; DME diabetic macular edema; dbh dot blot hemorrhages; CWS cotton wool spot; POAG primary open angle glaucoma; C/D cup-to-disc ratio; HVF humphrey visual field; GVF goldmann visual field; OCT optical coherence tomography; IOP intraocular pressure; BRVO Branch retinal vein occlusion; CRVO central retinal vein occlusion; CRAO central retinal artery occlusion; BRAO branch retinal artery occlusion; RT retinal tear; SB scleral buckle; PPV pars plana vitrectomy; VH Vitreous hemorrhage; PRP panretinal laser photocoagulation; IVK intravitreal kenalog; VMT vitreomacular traction; MH Macular hole;  NVD neovascularization of the disc; NVE neovascularization elsewhere; AREDS age related eye disease study; ARMD age related macular degeneration; POAG primary open angle glaucoma;  EBMD epithelial/anterior basement membrane dystrophy; ACIOL anterior chamber intraocular lens; IOL intraocular lens; PCIOL posterior chamber intraocular lens; Phaco/IOL phacoemulsification with intraocular lens placement; [place] photorefractive keratectomy; LASIK laser assisted in situ keratomileusis; HTN hypertension; DM diabetes mellitus; COPD chronic obstructive pulmonary disease

## 2020-04-07 ENCOUNTER — Other Ambulatory Visit: Payer: Self-pay | Admitting: Family Medicine

## 2020-04-25 ENCOUNTER — Telehealth: Payer: Self-pay

## 2020-04-25 ENCOUNTER — Other Ambulatory Visit: Payer: Self-pay | Admitting: Family Medicine

## 2020-04-25 MED ORDER — DULOXETINE HCL 20 MG PO CPEP: 20.0000 mg | ORAL_CAPSULE | Freq: Every day | ORAL | 3 refills | Status: DC

## 2020-04-25 MED ORDER — POTASSIUM CHLORIDE CRYS ER 20 MEQ PO TBCR: 20.0000 meq | EXTENDED_RELEASE_TABLET | Freq: Three times a day (TID) | ORAL | 3 refills | Status: DC

## 2020-04-25 NOTE — Telephone Encounter (Signed)
Patient calls nurse line reporting elevated blood glucose levels. Patient states AM blood sugar reading was at 200. Patient currently asymptomatic. Patient would like return call from Dr. Shan Levans regarding potentially restarting Lantus 8 units. Offered to schedule appointment with another provider. Patient would like to wait for further instruction from Dr. Shan Levans.    ED precautions given  To PCP  Talbot Grumbling, RN

## 2020-04-25 NOTE — Telephone Encounter (Signed)
Return [name]'s call regarding her blood sugar.  We discussed that elevated blood sugar is most detrimental when it is elevated chronically, and at her age, we can be more relaxed about blood glucose measurements.  She denies having polydipsia, polyuria, or polyphagia.  However, because these numbers worried her and she was tolerating Lantus 8 units well in the past, restarting Lantus is another viable option.  She would like to restart Lantus 8 units daily at this time, and I am agreeable with this plan.  I encouraged her to call if she has any other questions or concerns.

## 2020-04-27 ENCOUNTER — Other Ambulatory Visit: Payer: Self-pay

## 2020-04-27 MED ORDER — POTASSIUM CHLORIDE CRYS ER 20 MEQ PO TBCR: 20.0000 meq | EXTENDED_RELEASE_TABLET | Freq: Three times a day (TID) | ORAL | 3 refills | Status: DC

## 2020-05-02 ENCOUNTER — Ambulatory Visit (INDEPENDENT_AMBULATORY_CARE_PROVIDER_SITE_OTHER): Payer: Medicare Other | Admitting: Ophthalmology

## 2020-05-02 ENCOUNTER — Other Ambulatory Visit: Payer: Self-pay

## 2020-05-02 ENCOUNTER — Encounter (INDEPENDENT_AMBULATORY_CARE_PROVIDER_SITE_OTHER): Payer: Self-pay | Admitting: Ophthalmology

## 2020-05-02 DIAGNOSIS — H353212 Exudative age-related macular degeneration, right eye, with inactive choroidal neovascularization: Secondary | ICD-10-CM | POA: Insufficient documentation

## 2020-05-02 DIAGNOSIS — H33102 Unspecified retinoschisis, left eye: Secondary | ICD-10-CM

## 2020-05-02 DIAGNOSIS — H353211 Exudative age-related macular degeneration, right eye, with active choroidal neovascularization: Secondary | ICD-10-CM | POA: Diagnosis not present

## 2020-05-02 DIAGNOSIS — H43822 Vitreomacular adhesion, left eye: Secondary | ICD-10-CM

## 2020-05-02 DIAGNOSIS — H353221 Exudative age-related macular degeneration, left eye, with active choroidal neovascularization: Secondary | ICD-10-CM | POA: Diagnosis not present

## 2020-05-02 MED ORDER — BEVACIZUMAB CHEMO INJECTION 1.25MG/0.05ML SYRINGE FOR KALEIDOSCOPE
1.2500 mg | INTRAVITREAL | Status: AC | PRN
  Administered 2020-05-02: 1.25 mg via INTRAVITREAL

## 2020-05-02 NOTE — Assessment & Plan Note (Signed)

## 2020-05-02 NOTE — Assessment & Plan Note (Signed)
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  This has remained stable for years.

## 2020-05-02 NOTE — Progress Notes (Signed)
05/02/2020     CHIEF COMPLAINT Patient presents for Retina Follow Up   HISTORY OF PRESENT ILLNESS: [name] is a 79 y.o. female who presents to the clinic today for:   HPI    Retina Follow Up    Patient presents with  Wet AMD.  In right eye.  This started 5 weeks ago.  Severity is mild.  Duration of 5 weeks.  Since onset it is stable.          Comments    5 Week AMD F/U OD, poss Avastin OD  Pt denies noticeable changes to [place] OU since last visit. Pt denies ocular pain, flashes of light, or floaters OU.         Last edited by [name], [place] on 05/02/2020  8:44 AM. (History)      Referring physician: Kathrene Alu, MD [address],  [place] [address]  HISTORICAL INFORMATION:   Selected notes from the MEDICAL RECORD NUMBER    Lab Results  Component Value Date   HGBA1C 5.9 11/28/2018     CURRENT MEDICATIONS: No current outpatient medications on file. (Ophthalmic Drugs)   No current facility-administered medications for this visit. (Ophthalmic Drugs)   Current Outpatient Medications (Other)  Medication Sig  . acetaminophen (TYLENOL 8 HOUR) 650 MG CR tablet Take 1 tablet (650 mg total) by mouth every 8 (eight) hours as needed for pain.  Marland Kitchen apixaban (ELIQUIS) 5 MG TABS tablet Take 1 tablet (5 mg total) by mouth 2 (two) times daily.  Marland Kitchen atorvastatin (LIPITOR) 40 MG tablet TAKE 1 TABLET BY MOUTH  DAILY AT 6 PM.  . carvedilol (COREG) 6.25 MG tablet TAKE 1 TABLET BY MOUTH  TWICE DAILY WITH A MEAL  . cholecalciferol (VITAMIN D) 1000 UNITS tablet Take 1,000 Units by mouth daily.   . diclofenac Sodium (VOLTAREN) 1 % GEL Apply 2 g topically 4 (four) times daily.  . DULoxetine (CYMBALTA) 20 MG capsule Take 1 capsule (20 mg total) by mouth daily.  . ferrous sulfate 325 (65 FE) MG tablet TAKE 1 TABLET BY MOUTH EVERY DAY WITH BREAKFAST  . glucose blood (ONE TOUCH ULTRA TEST) test strip Use three times a day  . insulin glargine (LANTUS) 100 UNIT/ML  Solostar Pen Inject 8 Units into the skin every morning.  . Insulin Pen Needle (PEN NEEDLES) 32G X 4 MM MISC 1 each by Does not apply route at bedtime. Use to inject Lantus each night.  . Lancets (ONETOUCH DELICA PLUS EQASTM19Q) MISC 1 application by Does not apply route daily.  . nitroGLYCERIN (NITROSTAT) 0.4 MG SL tablet Place 1 tablet (0.4 mg total) under the tongue every 5 (five) minutes as needed for chest pain (up to 3 doses).  . potassium chloride SA (KLOR-CON) 20 MEQ tablet Take 1 tablet (20 mEq total) by mouth 3 (three) times daily.  Marland Kitchen rOPINIRole (REQUIP) 0.5 MG tablet TAKE 2 TABLETS BY MOUTH AT  BEDTIME  . torsemide (DEMADEX) 20 MG tablet Take 80mg  in the AM and 40mg  in the PM  . vitamin B-12 (CYANOCOBALAMIN) 1000 MCG tablet Take 1 tablet (1,000 mcg total) by mouth daily.   No current facility-administered medications for this visit. (Other)      REVIEW OF SYSTEMS:    ALLERGIES Allergies  Allergen Reactions  . Benazepril Other (See Comments)    Unknown reaction at age 14-65 - possibly dizziness  . Angiotensin Receptor Blockers Other (See Comments)    Hypotension reaction  .  Fe-Succ-C-Thre-B12-Des Stomach Other (See Comments)    Unknown reaction  . Sulfamethoxazole-Trimethoprim Other (See Comments)    Unknown reaction    PAST MEDICAL HISTORY Past Medical History:  Diagnosis Date  . ACC/AHA stage C congestive heart failure due to ischemic cardiomyopathy ([place]) 01/31/2009   Qualifier: Diagnosis of  By: Olevia Perches, MD, Glenetta Hew   . ANXIETY 01/31/2009   Qualifier: Diagnosis of  By: Lovette Cliche, CNA, Christy    . Arthritis    "qwhere" (03/30/2016)  . Benign essential HTN   . CAD (coronary artery disease) 05/2008   a. s/p CABG in 2009.  Marland Kitchen Cerebrovascular accident (CVA) due to embolism of right middle cerebral artery ([place])   . Chronic anticoagulation   . Chronic kidney disease (CKD), stage III (moderate)   . Chronic lower back pain   . Chronic pain syndrome   . Chronic  systolic CHF (congestive heart failure) ([place])   . DM type 2 with diabetic peripheral neuropathy ([place])   . Facial weakness, post-stroke   . Gastrointestinal hemorrhage 03/26/2017  . Hemiparesis ([place])   . History of CVA with residual deficit 03/26/2017  . Hyperlipidemia   . Hypertension   . Ischemic cardiomyopathy   . Longstanding persistent atrial fibrillation ([place])    a. Postoperative in 2009;  S/P transesophageal echocardiography-guided cardioversion, previously on Amiodarone and Coumadin. b. recurrent in April 2013 -> pt elected rate control strategy, initially Coumadin -> had stroke with subtherapeutic INR in 03/2016 and transitioned to Eliquis.  . Morbid obesity ([place]) 03/26/2017  . OSA on CPAP    severe OSA with AHI 34/hr  . Persistent atrial fibrillation ([place])   . Thrombocytopenia ([place])    Past Surgical History:  Procedure Laterality Date  . APPENDECTOMY  1946  . CATARACT EXTRACTION Right 2012  . COLONOSCOPY WITH PROPOFOL Left 03/27/2017   Procedure: COLONOSCOPY WITH PROPOFOL;  Surgeon: Ronnette Juniper, MD;  Location: [facility];  Service: Gastroenterology;  Laterality: Left;  . CORONARY ANGIOPLASTY WITH STENT PLACEMENT  2009   "put 2 stents in"  . HERNIA REPAIR    . IR LUMBAR [place] W/IMG GUIDE  10/08/2018  . JOINT REPLACEMENT    . TEE WITH CARDIOVERSION     Postoperative in 2009;  S/P transesophageal echocardiography-guided cardioversion,  . TOTAL KNEE ARTHROPLASTY Right 1994  . VENTRAL HERNIA REPAIR  10/1999   With mesh    FAMILY HISTORY Family History  Problem Relation Age of Onset  . Clotting disorder Mother        Cerebral hemorrhage  . Emphysema Father        COD  . Lung cancer Brother   . Heart disease Neg Hx     SOCIAL HISTORY Social History   Tobacco Use  . Smoking status: Never Smoker  . Smokeless tobacco: Never Used  Vaping Use  . Vaping Use: Never used  Substance Use Topics  . Alcohol use: No  . Drug use: No         OPHTHALMIC  EXAM:  Base Eye Exam    Visual Acuity (ETDRS)      Right Left   Dist cc 20/50ecc 20/25   Dist ph cc NI    Correction: Glasses       Tonometry (Tonopen, 8:49 AM)      Right Left   Pressure 13 10       Pupils      Pupils Dark Light Shape React APD   Right PERRL 3 2 Round Slow None  Left PERRL 3 2 Round Slow None       Visual Fields (Counting fingers)      Left Right    Full Full       Extraocular Movement      Right Left    Full Full       Neuro/Psych    Oriented x3: Yes   Mood/Affect: Normal       Dilation    Right eye: 1.0% Mydriacyl, 2.5% Phenylephrine @ 8:49 AM        Slit Lamp and Fundus Exam    External Exam      Right Left   External Normal Normal       Slit Lamp Exam      Right Left   Lids/Lashes Normal Normal   Conjunctiva/Sclera White and quiet White and quiet   Cornea Clear Clear   Anterior Chamber Deep and quiet Deep and quiet   Iris Round and reactive Round and reactive   Lens Posterior chamber intraocular lens Posterior chamber intraocular lens   Anterior Vitreous Normal Normal       Fundus Exam      Right Left   Posterior Vitreous Posterior vitreous detachment    Disc Normal    C/D Ratio 0.5    Macula Soft drusen, Retinal pigment epithelial mottling, no macular thickening    Vessels Normal    Periphery Normal           IMAGING AND PROCEDURES  Imaging and Procedures for 05/02/20  OCT, Retina - OU - Both Eyes       Right Eye Quality was good. Scan locations included subfoveal. Central Foveal Thickness: 228. Progression has improved. Findings include intraretinal hyper-reflective material.   Left Eye Quality was good. Scan locations included subfoveal. Central Foveal Thickness: 303. Progression has been stable. Findings include abnormal foveal contour, vitreomacular adhesion .   Notes OD, with cystoid macular edema and retinal thickening inferior to the fovea.  Overall slightly improved on intravitreal Avastin at 5-week  interval.  We will repeat examination in 6 weeks.       Intravitreal Injection, Pharmacologic Agent - OD - Right Eye       Time Out 05/02/2020. 9:30 AM. Confirmed correct patient, procedure, site, and patient consented.   Anesthesia Topical anesthesia was used. Anesthetic medications included Akten 3.5%.   Procedure Preparation included Ofloxacin , 10% betadine to eyelids. A 30 gauge needle was used.   Injection:  1.25 mg Bevacizumab (AVASTIN) SOLN   NDC: [address]-5, Lot: [id number]   Route: Intravitreal, Site: Right Eye, Waste: 0 mg  Post-op Post injection exam found visual acuity of at least counting fingers. The patient tolerated the procedure well. There were no complications. The patient received written and verbal post procedure care education. Post injection medications were not given.                 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  This has remained stable for years.  Exudative age-related macular degeneration of right eye with active choroidal neovascularization (HCC) The nature of wet macular degeneration was discussed with the patient.  Forms of therapy reviewed include the use of Anti-VEGF medications injected painlessly into the eye, as well as other possible treatment modalities, including thermal laser therapy. Fellow eye involvement and risks were discussed with the patient. Upon the finding of wet  age related macular degeneration, treatment will be offered. The treatment regimen is on a treat as needed basis with the intent to treat if necessary and extend interval of exams when possible. On average 1 out of 6 patients do not need lifetime therapy. However, the risk of recurrent disease is high for a lifetime.  Initially monthly, then periodic, examinations and evaluations will determine whether the next  treatment is required on the day of the examination.      ICD-10-CM   1. Exudative age-related macular degeneration of right eye with active choroidal neovascularization (HCC)  H35.3211 OCT, Retina - OU - Both Eyes    Intravitreal Injection, Pharmacologic Agent - OD - Right Eye    Bevacizumab (AVASTIN) SOLN 1.25 mg  2. Exudative age-related macular degeneration of left eye with active choroidal neovascularization ([place])  H35.3221   3. Vitreomacular adhesion of left eye  H43.822   4. Left retinoschisis  H33.102     1.  2.  3.  Ophthalmic Meds Ordered this visit:  Meds ordered this encounter  Medications  . Bevacizumab (AVASTIN) SOLN 1.25 mg       Return in about 6 weeks (around 06/13/2020) for dilate, OD, AVASTIN OCT.  There are no Patient Instructions on file for this visit.   Explained the diagnoses, plan, and follow up with the patient and they expressed understanding.  Patient expressed understanding of the importance of proper follow up care.   Clent Demark Ashur Glatfelter M.D. Diseases & Surgery of the Retina and Vitreous Retina [facility] Diabetic [place] 05/02/20     Abbreviations: M myopia (nearsighted); A astigmatism; H hyperopia (farsighted); P presbyopia; Mrx spectacle prescription;  CTL contact lenses; OD right eye; OS left eye; OU both eyes  XT exotropia; ET esotropia; PEK punctate epithelial keratitis; PEE punctate epithelial erosions; DES dry eye syndrome; MGD meibomian gland dysfunction; ATs artificial tears; PFAT's preservative free artificial tears; [place] nuclear sclerotic cataract; PSC posterior subcapsular cataract; ERM epi-retinal membrane; PVD posterior vitreous detachment; RD retinal detachment; DM diabetes mellitus; DR diabetic retinopathy; NPDR non-proliferative diabetic retinopathy; PDR proliferative diabetic retinopathy; CSME clinically significant macular edema; DME diabetic macular edema; dbh dot blot hemorrhages; CWS cotton wool spot; POAG primary open angle glaucoma;  C/D cup-to-disc ratio; HVF humphrey visual field; GVF goldmann visual field; OCT optical coherence tomography; IOP intraocular pressure; BRVO Branch retinal vein occlusion; CRVO central retinal vein occlusion; CRAO central retinal artery occlusion; BRAO branch retinal artery occlusion; RT retinal tear; SB scleral buckle; PPV pars plana vitrectomy; VH Vitreous hemorrhage; PRP panretinal laser photocoagulation; IVK intravitreal kenalog; VMT vitreomacular traction; MH Macular hole;  NVD neovascularization of the disc; NVE neovascularization elsewhere; AREDS age related eye disease study; ARMD age related macular degeneration; POAG primary open angle glaucoma; EBMD epithelial/anterior basement membrane dystrophy; ACIOL anterior chamber intraocular lens; IOL intraocular lens; PCIOL posterior chamber intraocular lens; Phaco/IOL phacoemulsification with intraocular lens placement; [place] photorefractive keratectomy; LASIK laser assisted in situ keratomileusis; HTN hypertension; DM diabetes mellitus; COPD chronic obstructive pulmonary disease

## 2020-05-09 ENCOUNTER — Encounter (INDEPENDENT_AMBULATORY_CARE_PROVIDER_SITE_OTHER): Payer: Medicare Other | Admitting: Ophthalmology

## 2020-05-17 ENCOUNTER — Other Ambulatory Visit: Payer: Self-pay

## 2020-05-17 ENCOUNTER — Other Ambulatory Visit: Payer: Medicare Other | Admitting: Orthotics

## 2020-05-17 DIAGNOSIS — M2012 Hallux valgus (acquired), left foot: Secondary | ICD-10-CM | POA: Diagnosis not present

## 2020-05-17 DIAGNOSIS — M2041 Other hammer toe(s) (acquired), right foot: Secondary | ICD-10-CM | POA: Diagnosis not present

## 2020-05-17 DIAGNOSIS — M2011 Hallux valgus (acquired), right foot: Secondary | ICD-10-CM | POA: Diagnosis not present

## 2020-05-17 DIAGNOSIS — E1159 Type 2 diabetes mellitus with other circulatory complications: Secondary | ICD-10-CM | POA: Diagnosis not present

## 2020-05-17 DIAGNOSIS — M2042 Other hammer toe(s) (acquired), left foot: Secondary | ICD-10-CM | POA: Diagnosis not present

## 2020-05-18 ENCOUNTER — Telehealth: Payer: Self-pay | Admitting: Sports Medicine

## 2020-05-18 NOTE — Telephone Encounter (Signed)
Pt called and stated the shoes she picked up yesterday she wore them 1 hr yesterday and 2 hrs today and they are not working for her. She said they are too high up on the front of her ankle. She is scheduled to see Dr March Rummage on 7.19 and I put the note in there to pick out a different shoe but if I can get her in with Liliane Channel or Benjie Karvonen I would call her to schedule an appt.

## 2020-05-22 NOTE — Progress Notes (Signed)
Subjective:  Patient ID: [name], female    DOB: [birth year],  MRN: [id number]  Chief Complaint  Patient presents with  . debride    DIabetic foot caer  . Diabetes    FBS: 165 A1C: 5.9 pcp: [facility] x 1 mo   79 y.o. female presents  for diabetic foot care. Last AMBS was 165 Denies numbness and tingling in their feet. Denies cramping in legs and thighs.  Review of Systems: Negative except as noted in the HPI. Denies N/V/F/Ch.  Past Medical History:  Diagnosis Date  . ACC/AHA stage C congestive heart failure due to ischemic cardiomyopathy ([place]) 01/31/2009   Qualifier: Diagnosis of  By: Olevia Perches, MD, Glenetta Hew   . ANXIETY 01/31/2009   Qualifier: Diagnosis of  By: Lovette Cliche, CNA, Christy    . Arthritis    "qwhere" (03/30/2016)  . Benign essential HTN   . CAD (coronary artery disease) 05/2008   a. s/p CABG in 2009.  Marland Kitchen Cerebrovascular accident (CVA) due to embolism of right middle cerebral artery ([place])   . Chronic anticoagulation   . Chronic kidney disease (CKD), stage III (moderate)   . Chronic lower back pain   . Chronic pain syndrome   . Chronic systolic CHF (congestive heart failure) ([place])   . DM type 2 with diabetic peripheral neuropathy ([place])   . Facial weakness, post-stroke   . Gastrointestinal hemorrhage 03/26/2017  . Hemiparesis ([place])   . History of CVA with residual deficit 03/26/2017  . Hyperlipidemia   . Hypertension   . Ischemic cardiomyopathy   . Longstanding persistent atrial fibrillation ([place])    a. Postoperative in 2009;  S/P transesophageal echocardiography-guided cardioversion, previously on Amiodarone and Coumadin. b. recurrent in April 2013 -> pt elected rate control strategy, initially Coumadin -> had stroke with subtherapeutic INR in 03/2016 and transitioned to Eliquis.  . Morbid obesity ([place]) 03/26/2017  . OSA on CPAP    severe OSA with AHI 34/hr  . Persistent atrial fibrillation ([place])   . Thrombocytopenia (HCC)     Current Outpatient Medications:    .  acetaminophen (TYLENOL 8 HOUR) 650 MG CR tablet, Take 1 tablet (650 mg total) by mouth every 8 (eight) hours as needed for pain., Disp: 30 tablet, Rfl: 0 .  apixaban (ELIQUIS) 5 MG TABS tablet, Take 1 tablet (5 mg total) by mouth 2 (two) times daily., Disp: 60 tablet, Rfl: 11 .  atorvastatin (LIPITOR) 40 MG tablet, TAKE 1 TABLET BY MOUTH  DAILY AT 6 PM., Disp: 90 tablet, Rfl: 3 .  carvedilol (COREG) 6.25 MG tablet, TAKE 1 TABLET BY MOUTH  TWICE DAILY WITH A MEAL, Disp: 60 tablet, Rfl: 11 .  cholecalciferol (VITAMIN D) 1000 UNITS tablet, Take 1,000 Units by mouth daily. , Disp: , Rfl:  .  diclofenac Sodium (VOLTAREN) 1 % GEL, Apply 2 g topically 4 (four) times daily., Disp: 50 g, Rfl: 0 .  DULoxetine (CYMBALTA) 20 MG capsule, Take 1 capsule (20 mg total) by mouth daily., Disp: 90 capsule, Rfl: 3 .  ferrous sulfate 325 (65 FE) MG tablet, TAKE 1 TABLET BY MOUTH EVERY DAY WITH BREAKFAST, Disp: 90 tablet, Rfl: 2 .  glucose blood (ONE TOUCH ULTRA TEST) test strip, Use three times a day, Disp: 100 each, Rfl: 3 .  insulin glargine (LANTUS) 100 UNIT/ML Solostar Pen, Inject 8 Units into the skin every morning., Disp: 3 mL, Rfl: 11 .  Insulin Pen Needle (PEN NEEDLES) 32G X 4 MM MISC, 1 each by Does  not apply route at bedtime. Use to inject Lantus each night., Disp: 90 each, Rfl: 12 .  Lancets (ONETOUCH DELICA PLUS [id number]) MISC, 1 application by Does not apply route daily., Disp: 100 each, Rfl: 6 .  nitroGLYCERIN (NITROSTAT) 0.4 MG SL tablet, Place 1 tablet (0.4 mg total) under the tongue every 5 (five) minutes as needed for chest pain (up to 3 doses)., Disp: 15 tablet, Rfl: 5 .  potassium chloride SA (KLOR-CON) 20 MEQ tablet, Take 1 tablet (20 mEq total) by mouth 3 (three) times daily., Disp: 270 tablet, Rfl: 3 .  rOPINIRole (REQUIP) 0.5 MG tablet, TAKE 2 TABLETS BY MOUTH AT  BEDTIME, Disp: 180 tablet, Rfl: 3 .  torsemide (DEMADEX) 20 MG tablet, Take 80mg  in the AM and 40mg  in the PM, Disp: 180 tablet,  Rfl: 3 .  vitamin B-12 (CYANOCOBALAMIN) 1000 MCG tablet, Take 1 tablet (1,000 mcg total) by mouth daily., Disp: 90 tablet, Rfl: 0  Social History   Tobacco Use  Smoking Status Never Smoker  Smokeless Tobacco Never Used    Allergies  Allergen Reactions  . Benazepril Other (See Comments)    Unknown reaction at age 23-65 - possibly dizziness  . Angiotensin Receptor Blockers Other (See Comments)    Hypotension reaction  . Fe-Succ-C-Thre-B12-Des Stomach Other (See Comments)    Unknown reaction  . Sulfamethoxazole-Trimethoprim Other (See Comments)    Unknown reaction   Objective:   There were no vitals filed for this visit. There is no height or weight on file to calculate BMI. Constitutional Well developed. Well nourished.  Vascular Dorsalis pedis pulses present 1+ bilaterally  Posterior tibial pulses absent bilaterally  Pedal hair growth diminished. Capillary refill normal to all digits.  No cyanosis or clubbing noted. Varicosities present bilateral  Neurologic Normal speech. Oriented to person, place, and time. Epicritic sensation to light touch grossly present bilaterally. Protective sensation with 5.07 monofilament  present bilaterally. Vibratory sensation present bilaterally.  Dermatologic Nails elongated, thickened, dystrophic. No open wounds. No skin lesions.  Orthopedic: Normal joint ROM without pain or crepitus bilaterally. Hallux abductovalgus deformity bilaterally, lesser digital contractures bilat Dorsal midfoot osteophytes without pain to palpation   Assessment:   1. Onychomycosis of multiple toenails with type 2 diabetes mellitus and peripheral angiopathy ([place])    Plan:  Patient was evaluated and treated and all questions answered.  Diabetes with PAD, Onychomycosis; HAV deformity with hammertoe formation -Educated on diabetic footcare. Diabetic risk level 1 -Nails x10 debrided sharply and manually with large nail nipper and rotary burr.    Procedure:  Nail Debridement Rationale: Patient meets criteria for routine foot care due to PAD Type of Debridement: manual, sharp debridement. Instrumentation: Nail nipper, rotary burr. Number of Nails: 10     No follow-ups on file.

## 2020-05-30 ENCOUNTER — Other Ambulatory Visit: Payer: Self-pay

## 2020-05-30 ENCOUNTER — Ambulatory Visit (INDEPENDENT_AMBULATORY_CARE_PROVIDER_SITE_OTHER): Payer: Medicare Other | Admitting: Podiatry

## 2020-05-30 DIAGNOSIS — E1169 Type 2 diabetes mellitus with other specified complication: Secondary | ICD-10-CM

## 2020-05-30 DIAGNOSIS — E1151 Type 2 diabetes mellitus with diabetic peripheral angiopathy without gangrene: Secondary | ICD-10-CM

## 2020-05-30 DIAGNOSIS — B351 Tinea unguium: Secondary | ICD-10-CM

## 2020-05-30 NOTE — Progress Notes (Signed)
  Subjective:  Patient ID: [name], female    DOB: [birth year],  MRN: [id number]  Chief Complaint  Patient presents with  . debride    Diabetic nail tirmming  . Diabetes    FBS: 1396 A1c; 5.2 PCP: [facility]  -pt brougth back diabetic shoes -per pt shoes come up too high and they are hurting her    79 y.o. female presents with the above complaint. History confirmed with patient.   Objective:  Physical Exam: warm, good capillary refill, nail exam onychomycosis of the toenails, no trophic changes or ulcerative lesions, normal DP and non-palp PT pulses, and normal sensory exam. Left Foot: bunion deformity noted and hammertoes  Right Foot: bunion deformity noted and hammertoes   No images are attached to the encounter.  Assessment:   1. Onychomycosis of multiple toenails with type 2 diabetes mellitus and peripheral angiopathy ([place])    Plan:  Patient was evaluated and treated and all questions answered.  Onychomycosis, Diabetes and PAD -Patient is diabetic with a qualifying condition for at risk foot care. -Advised to bring shoes to appt with orthotist for possible exchange.   No follow-ups on file.

## 2020-06-02 ENCOUNTER — Other Ambulatory Visit: Payer: Self-pay

## 2020-06-02 ENCOUNTER — Ambulatory Visit (INDEPENDENT_AMBULATORY_CARE_PROVIDER_SITE_OTHER): Payer: Medicare Other | Admitting: Family Medicine

## 2020-06-02 ENCOUNTER — Encounter: Payer: Self-pay | Admitting: Family Medicine

## 2020-06-02 VITALS — BP 128/64 | HR 64 | Ht 68.0 in | Wt 216.4 lb

## 2020-06-02 DIAGNOSIS — E1142 Type 2 diabetes mellitus with diabetic polyneuropathy: Secondary | ICD-10-CM | POA: Diagnosis not present

## 2020-06-02 DIAGNOSIS — R202 Paresthesia of skin: Secondary | ICD-10-CM | POA: Diagnosis not present

## 2020-06-02 HISTORY — DX: Paresthesia of skin: R20.2

## 2020-06-02 LAB — POCT GLYCOSYLATED HEMOGLOBIN (HGB A1C): HbA1c, POC (controlled diabetic range): 6.5 % (ref 0.0–7.0)

## 2020-06-02 MED ORDER — OZEMPIC (0.25 OR 0.5 MG/DOSE) 2 MG/1.5ML ~~LOC~~ SOPN: 0.2500 mg | PEN_INJECTOR | SUBCUTANEOUS | 1 refills | Status: DC

## 2020-06-02 NOTE — Progress Notes (Signed)
    SUBJECTIVE:   CHIEF COMPLAINT / HPI:   Hand Tingling Patient reports a burning sensation in bilateral hands, left worse than right. States this began several months ago (~6 months ago), but has been worse in the last month. The burning occurs in her entire hand, mostly felt on the dorsal surface, and does not radiate up her arms. It occurs predominately at night time and there is associated itching. Running cold water over her hands provides some relief. She has tried Gabapentin in the past but discontinued it b/c she felt it made her unsteady. She has been on Cymbalta 20mg  daily for the past several months with no improvement. Tylenol provides minimal relief. Denies numbness, tingling or burning in her feet or elsewhere in the body. No weakness in her upper extremities or skin changes noted.   Type 2 Diabetes Patient had previously been on 8u Lantus daily, which was discontinued in February 2021 due to A1c of 5.2. However, patient restarted her Lantus 1 month ago (June 2021) because she felt her sugars were elevated. She has been tolerating it well and denies hypoglycemic symptoms or episodes. CBGs: 110-150 Current medications: Lantus 8u Hypo/hyperglycemic sx: None Foot exam UTD (saw podiatry 05/30/20), eye exam UTD (saw ophtho 05/02/20)   PERTINENT  PMH / PSH: HTN, T2DM, CKD stage 3, a-fib, CVA, CAD, systolic HF (most recent EF 05/2019 45%)  OBJECTIVE:   BP 128/64   Pulse 64   Ht 5\' 8"  (1.727 m)   Wt 216 lb 6.4 oz (98.2 kg)   LMP  (LMP Unknown)   SpO2 98%   BMI 32.90 kg/m   General: alert, well-appearing, NAD Cardiac: irregularly irregular rhythm, normal S1/S2, no murmurs, rubs or gallops, trace pedal edema Lungs: Normal work of breathing, breath sounds equal bilaterally, lungs CTA without wheezes or rales Extremities: bilateral hands appear arthritic, nontender 2-3cm mucoid cyst on palmar aspect of left 1st digit MCP joint, no lesions or other skin changes, 5/5 strength in  bilateral upper extremities including grip strength, gross sensation intact Skin: scattered ecchymoses on bilateral forearms secondary to Eliquis use   ASSESSMENT/PLAN:   DM type 2 Very well-controlled. A1C 6.5% today.  UTD on foot and eye exams.  Given patient's age and very good glycemic control, discussed the risks/benefits of insulin use. I recommended she stop Lantus and initiate therapy with GLP-1 agonist instead, particularly given her CKD. Patient is agreeable with this plan. Plan: -Stop Lantus -Begin Ozempic 0.25mg  weekly (with plan to titrate up in 4 weeks). Sample given today. -Checking CBC/BMP today   Hand tingling Burning sensation & pruritus of bilateral hands, worsening over past month with no significant findings on physical exam.  Differential diagnosis includes diabetic peripheral neuropathy, other unspecified neurodermatitis, xerosis, scabies, pruritus secondary to CKD, medication reaction, B12 deficiency.  Do not suspect diabetic neuropathy at this point, as patient has excellent glycemic control, no symptoms in her feet, and sensation is intact. No evidence on exam of scabies or xerosis. Less likely CKD as pruritus is not generalized. Do not suspect limb ischemia or any other acute, severe etiology. Plan: -Recommended vaseline use nightly, as well as hydrocortisone 2.5% as needed -Continue Cymbalta 20mg  -Checking B12/Folate/MMA today -Checking CBC/BMP today -Can consider another trial of Gabapentin or other medication adjustment in the future if symptoms persist/worsen    Case discussed with Dr. Owens Shark.  Alcus Dad, MD [place]

## 2020-06-02 NOTE — Assessment & Plan Note (Addendum)
Burning sensation & pruritus of bilateral hands, worsening over past month with no significant findings on physical exam.  Differential diagnosis inclues diabetic peripheral neuropathy, other unspecified neurodermatitis, xerosis, scabies, pruritus secondary to CKD, medication reaction, B12 deficiency.  Do not suspect diabetic neuropathy at this point, as patient has excellent glycemic control, no symptoms in her feet, and sensation is intact. No evidence on exam of scabies or xerosis. Less likely CKD as pruritus is not generalized. Do not suspect limb ischemia or any other acute, severe etiology. Plan: -Recommended vaseline use nightly, as well as hydrocortisone 2.5% as needed -Continue Cymbalta 20mg  -Checking B12/Folate/MMA today -Checking CBC/BMP today -Can consider another trial of Gabapentin or other medication adjustment in the future if symptoms persist/worsen

## 2020-06-02 NOTE — Assessment & Plan Note (Addendum)
Very well-controlled. A1C 6.5% today.  UTD on foot and eye exams.  Given patient's age and very good glycemic control, discussed the risks/benefits of insulin use. I recommended she stop Lantus and initiate therapy with GLP-1 agonist instead, particularly given her CKD. Patient is agreeable with this plan. Plan: -Stop Lantus -Begin Ozempic 0.25mg  weekly (with plan to titrate up in 4 weeks) -Checking CBC/BMP today

## 2020-06-02 NOTE — Patient Instructions (Addendum)
It was great to meet you!  Our plans for today:  1. Start taking Ozempic 0.25mg  once per week.  As we discussed, you can start this on Saturday. STOP taking your Insulin the day you start the Ozempic. Some patients experience nausea and vomiting with this medication. If you are unable to tolerate it, please call.  2. Coat your hands in vaseline in the evening before bedtime to help with itching/burning. You may also apply a small amount of over the counter Hydrocortisone 2.5% cream.   We are checking some labs today, we will call you with any abnormal results.  Take care and seek immediate care sooner if you develop any concerns.   Dr. [facility]

## 2020-06-03 ENCOUNTER — Other Ambulatory Visit: Payer: Medicare Other

## 2020-06-03 DIAGNOSIS — R202 Paresthesia of skin: Secondary | ICD-10-CM | POA: Diagnosis not present

## 2020-06-03 DIAGNOSIS — E1142 Type 2 diabetes mellitus with diabetic polyneuropathy: Secondary | ICD-10-CM | POA: Diagnosis not present

## 2020-06-05 LAB — CBC
Hematocrit: 35.1 % (ref 34.0–46.6)
Hemoglobin: 11.2 g/dL (ref 11.1–15.9)
MCH: 30.6 pg (ref 26.6–33.0)
MCHC: 31.9 g/dL (ref 31.5–35.7)
MCV: 96 fL (ref 79–97)
Platelets: 127 10*3/uL — ABNORMAL LOW (ref 150–450)
RBC: 3.66 x10E6/uL — ABNORMAL LOW (ref 3.77–5.28)
RDW: 12.1 % (ref 11.7–15.4)
WBC: 4.7 10*3/uL (ref 3.4–10.8)

## 2020-06-05 LAB — BASIC METABOLIC PANEL
BUN/Creatinine Ratio: 30 — ABNORMAL HIGH (ref 12–28)
BUN: 56 mg/dL — ABNORMAL HIGH (ref 8–27)
CO2: 28 mmol/L (ref 20–29)
Calcium: 10.2 mg/dL (ref 8.7–10.3)
Chloride: 98 mmol/L (ref 96–106)
Creatinine, Ser: 1.84 mg/dL — ABNORMAL HIGH (ref 0.57–1.00)
GFR calc Af Amer: 29 mL/min/{1.73_m2} — ABNORMAL LOW (ref >59)
GFR calc non Af Amer: 25 mL/min/{1.73_m2} — ABNORMAL LOW (ref >59)
Glucose: 127 mg/dL — ABNORMAL HIGH (ref 65–99)
Potassium: 4.2 mmol/L (ref 3.5–5.2)
Sodium: 139 mmol/L (ref 134–144)

## 2020-06-05 LAB — B12 AND FOLATE PANEL
Folate: >20 ng/mL (ref >3.0)
Vitamin B-12: >2000 pg/mL — ABNORMAL HIGH (ref 232–1245)

## 2020-06-05 LAB — METHYLMALONIC ACID, SERUM: Methylmalonic Acid: 331 nmol/L (ref 0–378)

## 2020-06-08 ENCOUNTER — Other Ambulatory Visit (HOSPITAL_COMMUNITY): Payer: Self-pay | Admitting: *Deleted

## 2020-06-08 ENCOUNTER — Telehealth: Payer: Self-pay

## 2020-06-08 MED ORDER — TORSEMIDE 20 MG PO TABS: ORAL_TABLET | ORAL | 3 refills | Status: DC

## 2020-06-08 NOTE — Telephone Encounter (Signed)
Patient calls nurse line complaining of undesired side effects from [place]. Patient reports she was given a sample at last office visit and did first weekly injection on 7/24. Patient reports abdominal pain, increased thirst, and changes in vision. Patient states, "this is from the medicine, I have been fine and nothing has changed except adding that." Patient reports her sugars are "fine." Patient advised to not take this Saturdays injection until she hears from [place]. Will forward to PCP for next steps.

## 2020-06-10 NOTE — Telephone Encounter (Signed)
Patient calls nurse checking status of Ozempic injection. Patient is due for next injection tomorrow, however with the undesired side effects she does not want to take, unless instructed by a provider. Patient reports her symptoms have not changed or gotten better. Patient still reports increased thirst, and abdominal pain with diarrhea. Patient reports her home cbgs have been ranging from 130-157 this week. Please advise on continuing or stopping Ozempic. I attempted to schedule her an apt to discuss, however there were a lot of scheduling conflicts as she does not drive and relies on her son.

## 2020-06-10 NOTE — Telephone Encounter (Signed)
Precepted with Ardelia Mems who suggested to stop Ozempic. Patient scheduled for 8/5.

## 2020-06-13 ENCOUNTER — Encounter (INDEPENDENT_AMBULATORY_CARE_PROVIDER_SITE_OTHER): Payer: Medicare Other | Admitting: Ophthalmology

## 2020-06-14 ENCOUNTER — Ambulatory Visit (INDEPENDENT_AMBULATORY_CARE_PROVIDER_SITE_OTHER): Payer: Medicare Other | Admitting: Ophthalmology

## 2020-06-14 ENCOUNTER — Other Ambulatory Visit: Payer: Self-pay

## 2020-06-14 ENCOUNTER — Encounter (INDEPENDENT_AMBULATORY_CARE_PROVIDER_SITE_OTHER): Payer: Self-pay | Admitting: Ophthalmology

## 2020-06-14 DIAGNOSIS — E113491 Type 2 diabetes mellitus with severe nonproliferative diabetic retinopathy without macular edema, right eye: Secondary | ICD-10-CM

## 2020-06-14 DIAGNOSIS — H353211 Exudative age-related macular degeneration, right eye, with active choroidal neovascularization: Secondary | ICD-10-CM | POA: Diagnosis not present

## 2020-06-14 DIAGNOSIS — H43822 Vitreomacular adhesion, left eye: Secondary | ICD-10-CM

## 2020-06-14 DIAGNOSIS — E113493 Type 2 diabetes mellitus with severe nonproliferative diabetic retinopathy without macular edema, bilateral: Secondary | ICD-10-CM | POA: Insufficient documentation

## 2020-06-14 MED ORDER — BEVACIZUMAB CHEMO INJECTION 1.25MG/0.05ML SYRINGE FOR KALEIDOSCOPE
1.2500 mg | INTRAVITREAL | Status: AC | PRN
  Administered 2020-06-14: 1.25 mg via INTRAVITREAL

## 2020-06-14 NOTE — Assessment & Plan Note (Signed)
Elevation inner foveal region with foveal macular schisis, no progression and with good acuity

## 2020-06-14 NOTE — Progress Notes (Signed)
06/14/2020     CHIEF COMPLAINT Patient presents for Retina Follow Up   HISTORY OF PRESENT ILLNESS: [name] is a 79 y.o. female who presents to the clinic today for:   HPI    Retina Follow Up    Patient presents with  Wet AMD.  In right eye.  Severity is moderate.  Duration of 6 weeks.  Since onset it is stable.  I, the attending physician,  performed the HPI with the patient and updated documentation appropriately.          Comments    6 Week AMD f\u OD. Possible Avastin OD. OCT  Pt has noticed a decrease in OD vision.  BGL: 138       Last edited by [name] on 06/14/2020  8:27 AM. (History)      Referring physician: Alcus Dad, MD [place],  [place] [address]  HISTORICAL INFORMATION:   Selected notes from the MEDICAL RECORD NUMBER    Lab Results  Component Value Date   HGBA1C 6.5 06/02/2020     CURRENT MEDICATIONS: No current outpatient medications on file. (Ophthalmic Drugs)   No current facility-administered medications for this visit. (Ophthalmic Drugs)   Current Outpatient Medications (Other)  Medication Sig  . acetaminophen (TYLENOL 8 HOUR) 650 MG CR tablet Take 1 tablet (650 mg total) by mouth every 8 (eight) hours as needed for pain.  Marland Kitchen apixaban (ELIQUIS) 5 MG TABS tablet Take 1 tablet (5 mg total) by mouth 2 (two) times daily.  Marland Kitchen atorvastatin (LIPITOR) 40 MG tablet TAKE 1 TABLET BY MOUTH  DAILY AT 6 PM.  . carvedilol (COREG) 6.25 MG tablet TAKE 1 TABLET BY MOUTH  TWICE DAILY WITH A MEAL  . cholecalciferol (VITAMIN D) 1000 UNITS tablet Take 1,000 Units by mouth daily.   . diclofenac Sodium (VOLTAREN) 1 % GEL Apply 2 g topically 4 (four) times daily. (Patient not taking: Reported on 06/02/2020)  . DULoxetine (CYMBALTA) 20 MG capsule Take 1 capsule (20 mg total) by mouth daily.  . ferrous sulfate 325 (65 FE) MG tablet TAKE 1 TABLET BY MOUTH EVERY DAY WITH BREAKFAST  . glucose blood (ONE TOUCH ULTRA TEST) test strip Use three  times a day  . insulin glargine (LANTUS) 100 UNIT/ML Solostar Pen Inject 8 Units into the skin every morning. (Patient not taking: Reported on 06/14/2020)  . Insulin Pen Needle (PEN NEEDLES) 32G X 4 MM MISC 1 each by Does not apply route at bedtime. Use to inject Lantus each night.  . Lancets (ONETOUCH DELICA PLUS [id number]) MISC 1 application by Does not apply route daily.  . nitroGLYCERIN (NITROSTAT) 0.4 MG SL tablet Place 1 tablet (0.4 mg total) under the tongue every 5 (five) minutes as needed for chest pain (up to 3 doses).  . potassium chloride SA (KLOR-CON) 20 MEQ tablet Take 1 tablet (20 mEq total) by mouth 3 (three) times daily.  Marland Kitchen rOPINIRole (REQUIP) 0.5 MG tablet TAKE 2 TABLETS BY MOUTH AT  BEDTIME  . Semaglutide,0.25 or 0.5MG /DOS, (OZEMPIC, 0.25 OR 0.5 MG/DOSE,) 2 MG/1.5ML SOPN Inject 0.1875 mLs (0.25 mg total) into the skin once a week.  . torsemide (DEMADEX) 20 MG tablet Take 80mg  in the AM and 40mg  in the PM  . vitamin B-12 (CYANOCOBALAMIN) 1000 MCG tablet Take 1 tablet (1,000 mcg total) by mouth daily.   No current facility-administered medications for this visit. (Other)      REVIEW OF SYSTEMS: ROS  Positive for: Endocrine   Last edited by [name] on 06/14/2020  8:27 AM. (History)       ALLERGIES Allergies  Allergen Reactions  . Benazepril Other (See Comments)    Unknown reaction at age 56-65 - possibly dizziness  . Angiotensin Receptor Blockers Other (See Comments)    Hypotension reaction  . Fe-Succ-C-Thre-B12-Des Stomach Other (See Comments)    Unknown reaction  . Sulfamethoxazole-Trimethoprim Other (See Comments)    Unknown reaction    PAST MEDICAL HISTORY Past Medical History:  Diagnosis Date  . ACC/AHA stage C congestive heart failure due to ischemic cardiomyopathy ([place]) 01/31/2009   Qualifier: Diagnosis of  By: Olevia Perches, MD, Glenetta Hew   . ANXIETY 01/31/2009   Qualifier: Diagnosis of  By: Lovette Cliche, CNA, Christy    . Arthritis    "qwhere"  (03/30/2016)  . Benign essential HTN   . CAD (coronary artery disease) 05/2008   a. s/p CABG in 2009.  Marland Kitchen Cerebrovascular accident (CVA) due to embolism of right middle cerebral artery ([place])   . Chronic anticoagulation   . Chronic kidney disease (CKD), stage III (moderate)   . Chronic lower back pain   . Chronic pain syndrome   . Chronic systolic CHF (congestive heart failure) ([place])   . DM type 2 with diabetic peripheral neuropathy ([place])   . Facial weakness, post-stroke   . Gastrointestinal hemorrhage 03/26/2017  . Hemiparesis ([place])   . History of CVA with residual deficit 03/26/2017  . Hyperlipidemia   . Hypertension   . Ischemic cardiomyopathy   . Longstanding persistent atrial fibrillation ([place])    a. Postoperative in 2009;  S/P transesophageal echocardiography-guided cardioversion, previously on Amiodarone and Coumadin. b. recurrent in April 2013 -> pt elected rate control strategy, initially Coumadin -> had stroke with subtherapeutic INR in 03/2016 and transitioned to Eliquis.  . Morbid obesity ([place]) 03/26/2017  . OSA on CPAP    severe OSA with AHI 34/hr  . Persistent atrial fibrillation ([place])   . Thrombocytopenia ([place])    Past Surgical History:  Procedure Laterality Date  . APPENDECTOMY  1946  . CATARACT EXTRACTION Right 2012  . COLONOSCOPY WITH PROPOFOL Left 03/27/2017   Procedure: COLONOSCOPY WITH PROPOFOL;  Surgeon: Ronnette Juniper, MD;  Location: [facility];  Service: Gastroenterology;  Laterality: Left;  . CORONARY ANGIOPLASTY WITH STENT PLACEMENT  2009   "put 2 stents in"  . HERNIA REPAIR    . IR LUMBAR [place] W/IMG GUIDE  10/08/2018  . JOINT REPLACEMENT    . TEE WITH CARDIOVERSION     Postoperative in 2009;  S/P transesophageal echocardiography-guided cardioversion,  . TOTAL KNEE ARTHROPLASTY Right 1994  . VENTRAL HERNIA REPAIR  10/1999   With mesh    FAMILY HISTORY Family History  Problem Relation Age of Onset  . Clotting disorder Mother        Cerebral  hemorrhage  . Emphysema Father        COD  . Lung cancer Brother   . Heart disease Neg Hx     SOCIAL HISTORY Social History   Tobacco Use  . Smoking status: Never Smoker  . Smokeless tobacco: Never Used  Vaping Use  . Vaping Use: Never used  Substance Use Topics  . Alcohol use: No  . Drug use: No         OPHTHALMIC EXAM:  Base Eye Exam    Visual Acuity (Snellen - Linear)      Right Left   Dist cc 20/40  20/25   Dist ph cc NI    Correction: Glasses       Tonometry (Tonopen, 8:34 AM)      Right Left   Pressure 12 12       Pupils      Pupils Dark Light Shape React APD   Right PERRL 3 2 Round Slow None   Left PERRL 3 2 Round Slow None       Visual Fields (Counting fingers)      Left Right    Full Full       Neuro/Psych    Oriented x3: Yes   Mood/Affect: Normal       Dilation    Right eye: 1.0% Mydriacyl, 2.5% Phenylephrine @ 8:34 AM        Slit Lamp and Fundus Exam    External Exam      Right Left   External Normal Normal       Slit Lamp Exam      Right Left   Lids/Lashes Normal Normal   Conjunctiva/Sclera White and quiet White and quiet   Cornea Clear Clear   Anterior Chamber Deep and quiet Deep and quiet   Iris Round and reactive Round and reactive   Lens Posterior chamber intraocular lens Posterior chamber intraocular lens   Anterior Vitreous Normal Normal       Fundus Exam      Right Left   Posterior Vitreous Posterior vitreous detachment    Disc Normal    C/D Ratio 0.5    Macula Soft drusen, Retinal pigment epithelial mottling, no macular thickening    Vessels NPDR-Severe    Periphery Normal           IMAGING AND PROCEDURES  Imaging and Procedures for 06/14/20  OCT, Retina - OU - Both Eyes       Right Eye Quality was good. Scan locations included subfoveal. Central Foveal Thickness: 229. Progression has improved. Findings include abnormal foveal contour, no SRF, retinal drusen , intraretinal fluid.   Left Eye Quality  was good. Scan locations included subfoveal. Central Foveal Thickness: 300. Progression has been stable. Findings include vitreous traction, abnormal foveal contour, retinal drusen , no SRF.   Notes OD, improved CNVM and retinal thickening inferior to the fovea, currently at 6-week interval exam.  We will repeat injection Avastin today and examination in 6 weeks OU       Intravitreal Injection, Pharmacologic Agent - OD - Right Eye       Time Out 06/14/2020. 9:22 AM. Confirmed correct patient, procedure, site, and patient consented.   Anesthesia Topical anesthesia was used. Anesthetic medications included [place] 3.5%.   Procedure Preparation included Ofloxacin , 10% betadine to eyelids, 5% betadine to ocular surface. A 30 gauge needle was used.   Injection:  1.25 mg Bevacizumab (AVASTIN) SOLN   NDC: [address]-0, Lot: [id number]   Route: Intravitreal, Site: Right Eye, Waste: 0 mg  Post-op Post injection exam found visual acuity of at least counting fingers. The patient tolerated the procedure well. There were no complications. The patient received written and verbal post procedure care education. Post injection medications were not given.                 ASSESSMENT/PLAN:  Vitreomacular adhesion of left eye Elevation inner foveal region with foveal macular schisis, no progression and with good acuity      ICD-10-CM   1. Exudative age-related macular degeneration of right eye with active choroidal neovascularization ([place])  H35.3211  OCT, Retina - OU - Both Eyes    Intravitreal Injection, Pharmacologic Agent - OD - Right Eye    Bevacizumab (AVASTIN) SOLN 1.25 mg  2. Severe nonproliferative diabetic retinopathy of right eye without macular edema associated with type 2 diabetes mellitus ([place])  E11.3491   3. Vitreomacular adhesion of left eye  H43.822     1.  2.  3.  Ophthalmic Meds Ordered this visit:  Meds ordered this encounter  Medications  . Bevacizumab (AVASTIN) SOLN  1.25 mg       Return in about 5 weeks (around 07/19/2020) for DILATE OU, AVASTIN OCT, OD.  There are no Patient Instructions on file for this visit.   Explained the diagnoses, plan, and follow up with the patient and they expressed understanding.  Patient expressed understanding of the importance of proper follow up care.   Clent Demark Reynold Mantell M.D. Diseases & Surgery of the Retina and Vitreous [facility]ina [facility] Diabetic [place] 06/14/20     Abbreviations: M myopia (nearsighted); A astigmatism; H hyperopia (farsighted); P presbyopia; Mrx spectacle prescription;  CTL contact lenses; OD right eye; OS left eye; OU both eyes  XT exotropia; ET esotropia; PEK punctate epithelial keratitis; PEE punctate epithelial erosions; DES dry eye syndrome; MGD meibomian gland dysfunction; ATs artificial tears; PFAT's preservative free artificial tears; [place] nuclear sclerotic cataract; PSC posterior subcapsular cataract; ERM epi-retinal membrane; PVD posterior vitreous detachment; RD retinal detachment; DM diabetes mellitus; DR diabetic retinopathy; NPDR non-proliferative diabetic retinopathy; PDR proliferative diabetic retinopathy; CSME clinically significant macular edema; DME diabetic macular edema; dbh dot blot hemorrhages; CWS cotton wool spot; POAG primary open angle glaucoma; C/D cup-to-disc ratio; HVF humphrey visual field; GVF goldmann visual field; OCT optical coherence tomography; IOP intraocular pressure; BRVO Branch retinal vein occlusion; CRVO central retinal vein occlusion; CRAO central retinal artery occlusion; BRAO branch retinal artery occlusion; RT retinal tear; SB scleral buckle; PPV pars plana vitrectomy; VH Vitreous hemorrhage; PRP panretinal laser photocoagulation; IVK intravitreal kenalog; VMT vitreomacular traction; MH Macular hole;  NVD neovascularization of the disc; NVE neovascularization elsewhere; AREDS age related eye disease study; ARMD age related macular degeneration; POAG primary open angle  glaucoma; EBMD epithelial/anterior basement membrane dystrophy; ACIOL anterior chamber intraocular lens; IOL intraocular lens; PCIOL posterior chamber intraocular lens; Phaco/IOL phacoemulsification with intraocular lens placement; [place] photorefractive keratectomy; LASIK laser assisted in situ keratomileusis; HTN hypertension; DM diabetes mellitus; COPD chronic obstructive pulmonary disease

## 2020-06-16 ENCOUNTER — Ambulatory Visit (INDEPENDENT_AMBULATORY_CARE_PROVIDER_SITE_OTHER): Payer: Medicare Other | Admitting: Student in an Organized Health Care Education/Training Program

## 2020-06-16 ENCOUNTER — Other Ambulatory Visit: Payer: Self-pay

## 2020-06-16 DIAGNOSIS — E1142 Type 2 diabetes mellitus with diabetic polyneuropathy: Secondary | ICD-10-CM | POA: Diagnosis not present

## 2020-06-16 NOTE — Progress Notes (Signed)
    SUBJECTIVE:   CHIEF COMPLAINT / HPI: ozempic side effects.  Discontinued insulin at last appointment- following blood sugars at home were: 150, 147, 157, 131, 144, 155.  Started ozempic on 31st as instructed at last appointment. following blood sugars were: 117, 150, 144, 138, 136. Since starting ozempic, has had diarrhea for a fwe days which resolved and is now constipated. She has sensation of upset stomach and nausea without vomiting and lightheadedness.  Chronic neuropathic pain. Had pain like burning in hands. Has gabapentin at home.   PERTINENT  PMH / PSH: well- controlled T2DM  OBJECTIVE:   BP 102/62   Pulse 70   Wt 218 lb 9.6 oz (99.2 kg)   LMP  (LMP Unknown)   SpO2 98%   BMI 33.24 kg/m   General: NAD, pleasant, able to participate in exam Extremities: no edema or cyanosis. WWP. Skin: warm and dry, no rashes noted Neuro: alert and oriented x4, no focal deficits Psych: Normal affect and mood  ASSESSMENT/PLAN:   DM type 2 with diabetic peripheral neuropathy (HCC) A1c well controlled and well beyond goal for age.  Agree with discontinuing insulin. OK to discontinue ozempic for side effects.  Likely ok without additional DM agent, but we discussed the alternate benefits of cardiac protection. Patient would like more time to think about this decision and has an appointment with PCP coming up. She will continue to monitor her BS until then and discuss choice with PCP. -neuropathy of feet. Has tried gabapentin in the past but patient has poor kidney function. Will trial 100mg  gabapentin QHS. Patient already has this prescription at home. Would recommend rechecking kidney function at next visit.     [place]

## 2020-06-16 NOTE — Patient Instructions (Signed)
It was a pleasure to see you today!  To summarize our discussion for this visit:  Your diabetes is well controlled which is why we stopped your insulin.  I am sorry to hear that Ozempic was giving you negative side effects but the effect should wear off in the next day or 2.  We discussed trying another medication in the same category to help treat not only your diabetes but also protect your heart.  It is understandable that you would want to think about this decision so I will let you follow-up with Dr. Rock Nephew in a couple of weeks.  For the burning in your hands, I think that it would be safe for you to take 100 mg of gabapentin at night to see if this can help your symptoms.  Some additional health maintenance measures we should update are: Health Maintenance Due  Topic Date Due  . URINE MICROALBUMIN  Never done  . INFLUENZA VACCINE  06/12/2020  .   Call the clinic at [phone] if your symptoms worsen or you have any concerns.   Thank you for allowing me to take part in your care,  Dr. Doristine Mango

## 2020-06-17 ENCOUNTER — Other Ambulatory Visit: Payer: Self-pay

## 2020-06-17 NOTE — Telephone Encounter (Signed)
Patient calls nurse line reporting she saw [facility] yesterday. Patient reports she was told to start taking Gabapentin 100mg , however she only has 300mg  at home. Please send in to [facility].

## 2020-06-18 MED ORDER — GABAPENTIN 100 MG PO CAPS
100.0000 mg | ORAL_CAPSULE | Freq: Every day | ORAL | 0 refills | Status: DC
Start: 2020-06-18 — End: 2020-07-04

## 2020-06-22 NOTE — Assessment & Plan Note (Signed)
A1c well controlled and well beyond goal for age.  Agree with discontinuing insulin. OK to discontinue ozempic for side effects.  Likely ok without additional DM agent, but we discussed the alternate benefits of cardiac protection. Patient would like more time to think about this decision and has an appointment with PCP coming up. She will continue to monitor her BS until then and discuss choice with PCP. -neuropathy of feet. Has tried gabapentin in the past but patient has poor kidney function. Will trial 100mg  gabapentin QHS. Patient already has this prescription at home. Would recommend rechecking kidney function at next visit.

## 2020-06-23 ENCOUNTER — Telehealth (HOSPITAL_COMMUNITY): Payer: Self-pay | Admitting: Pharmacy Technician

## 2020-06-23 ENCOUNTER — Telehealth (HOSPITAL_COMMUNITY): Payer: Self-pay

## 2020-06-23 NOTE — Telephone Encounter (Signed)
Ok perfect, she did state she did have something telling her how much she spent [employer]

## 2020-06-23 NOTE — Telephone Encounter (Signed)
I received a message regarding the patient's OOP expense report for [employer]. I called and spoke with the patient and reminded her that she needed to spend $306. She states that she has spent a little over $500.  She is going to bring in the OOP expense report that way we are able to get her approved for Eliquis assistance.  Charlann Boxer, CPhT

## 2020-06-23 NOTE — Telephone Encounter (Signed)
Patient called to see if she could possibly be placed on another medication besides the Eliquis or receive some type of patient assistance due to the medication being expensive and her being in the "donut Hole" with her insurance company. Please advise.

## 2020-06-23 NOTE — Telephone Encounter (Signed)
I submitted her BMS application earlier this year. She was told that she needed to spend $306.36 out of pocket on prescription drugs before she would be approved. Kathlee Nations, can you reach out to her and her her OOP expense report so we can send it in to BMS?

## 2020-06-24 ENCOUNTER — Telehealth (HOSPITAL_COMMUNITY): Payer: Self-pay | Admitting: Pharmacy Technician

## 2020-06-24 NOTE — Telephone Encounter (Signed)
Sent in 2021 OOP expense report to [employer].  Will follow up.

## 2020-06-28 ENCOUNTER — Other Ambulatory Visit: Payer: Medicare Other

## 2020-06-28 ENCOUNTER — Other Ambulatory Visit: Payer: Self-pay

## 2020-06-29 NOTE — Telephone Encounter (Signed)
[employer] sent [place] a letter stating there was missing information on the prescriber's application.  That was fixed and faxed in. Will follow up.

## 2020-07-04 ENCOUNTER — Encounter: Payer: Self-pay | Admitting: Family Medicine

## 2020-07-04 ENCOUNTER — Other Ambulatory Visit: Payer: Self-pay

## 2020-07-04 ENCOUNTER — Ambulatory Visit (INDEPENDENT_AMBULATORY_CARE_PROVIDER_SITE_OTHER): Payer: Medicare Other | Admitting: Family Medicine

## 2020-07-04 VITALS — BP 102/72 | HR 71 | Wt 225.6 lb

## 2020-07-04 DIAGNOSIS — E1140 Type 2 diabetes mellitus with diabetic neuropathy, unspecified: Secondary | ICD-10-CM

## 2020-07-04 DIAGNOSIS — E785 Hyperlipidemia, unspecified: Secondary | ICD-10-CM

## 2020-07-04 DIAGNOSIS — E1142 Type 2 diabetes mellitus with diabetic polyneuropathy: Secondary | ICD-10-CM

## 2020-07-04 MED ORDER — GABAPENTIN 100 MG PO CAPS: 100.0000 mg | ORAL_CAPSULE | Freq: Every day | ORAL | 3 refills | Status: DC

## 2020-07-04 NOTE — Progress Notes (Signed)
    SUBJECTIVE:   CHIEF COMPLAINT / HPI:   Diabetes Follow-Up -Discontinued Lantus on 06/02/20 due to A1C 6.5%  -Started Ozempic 0.25mg  weekly at that time with plan to titrate up to 0.5mg . However, patient discontinued Ozempic a few weeks later due to GI side effects (diarrhea, nausea). -Patient states her sugars have been well controlled in the 130s-140s mostly.   Peripheral Neuropathy Resolved after initiation of Gabapentin 100mg  at bedtime.    PERTINENT  PMH / PSH: well-controlled T2DM, HTN, CKD stage 3, a-fib, CVA, CAD, systolic HF (most recent EF 05/2019 45%)  OBJECTIVE:   BP 102/72   Pulse 71   Wt 225 lb 9.6 oz (102.3 kg)   LMP  (LMP Unknown)   SpO2 96%   BMI 34.30 kg/m   Gen: alert, well-appearing, NAD Cardiac: irregularly irregular rhythm, no m/r/g Lungs: Normal work of breathing, breath sounds equal bilaterally, lungs CTA without wheezes or rales Abd: obese, soft, nontender Ext: no peripheral edema, sensation intact   ASSESSMENT/PLAN:   DM type 2 with diabetic peripheral neuropathy (HCC) A1C well controlled at 6.5% last month.  Agree with discontinuation of Ozempic due to GI intolerance. I do not feel she needs DM medication at this time, so we will continue to monitor off meds. Will check urine microalbumin/creatinine today. Will continue Gabapentin 100mg  qhs for neuropathy. Refills provided today.     Alcus Dad, MD [place]

## 2020-07-04 NOTE — Patient Instructions (Addendum)
It was great to see you!  Our plans for today:  -Continue taking Gabapentin 100mg  at bedtime. I have sent refills to your pharmacy  We are checking a urine test today, I will call you if the results are abnormal.  Take care and seek immediate care sooner if you develop any concerns.   Dr. [facility]

## 2020-07-04 NOTE — Assessment & Plan Note (Addendum)
A1C well controlled at 6.5% last month.  Agree with discontinuation of Ozempic due to GI intolerance. I do not feel she needs DM medication at this time, so we will continue to monitor off meds. Will check urine microalbumin/creatinine today. Will continue Gabapentin 100mg  qhs for neuropathy. Refills provided today.

## 2020-07-05 LAB — MICROALBUMIN / CREATININE URINE RATIO
Creatinine, Urine: 25 mg/dL
Microalb/Creat Ratio: 168 mg/g creat — ABNORMAL HIGH (ref 0–29)
Microalbumin, Urine: 42.1 ug/mL

## 2020-07-11 ENCOUNTER — Telehealth: Payer: Self-pay | Admitting: Podiatry

## 2020-07-11 NOTE — Telephone Encounter (Signed)
Pt left message that she received a bill from [employer] for the shoes and she took them back..   I returned call and explained that the shoes were billed originally when she left with them the 1st time and that I understand that she has ordered a different pair and has an appt on 9.14.2021 to pick up the reordered pair. I explained that she will not be billed again for the shoes even though she is getting a different pair because we do not bill for each different pair they are all billed the same. I told pt I would get her put on a payment plan and that way she could pay when she gets the new shoes.

## 2020-07-13 NOTE — Telephone Encounter (Signed)
Called [employer] to check the status of the patient's application. The representative stated that they never received her OOP expense report. She gave me a web portal we can submit documentation on, [email or web address].info/. The support code is the company's fax number.  Will resend that in for the patient.

## 2020-07-13 NOTE — Telephone Encounter (Signed)
Spoke with patient, will have samples at the front desk for her to pick up while we wait on a BMS determination.  Will follow up.

## 2020-07-19 ENCOUNTER — Ambulatory Visit (INDEPENDENT_AMBULATORY_CARE_PROVIDER_SITE_OTHER): Payer: Medicare Other | Admitting: Ophthalmology

## 2020-07-19 ENCOUNTER — Other Ambulatory Visit: Payer: Self-pay

## 2020-07-19 ENCOUNTER — Encounter (INDEPENDENT_AMBULATORY_CARE_PROVIDER_SITE_OTHER): Payer: Self-pay | Admitting: Ophthalmology

## 2020-07-19 DIAGNOSIS — H43822 Vitreomacular adhesion, left eye: Secondary | ICD-10-CM

## 2020-07-19 DIAGNOSIS — H353211 Exudative age-related macular degeneration, right eye, with active choroidal neovascularization: Secondary | ICD-10-CM | POA: Diagnosis not present

## 2020-07-19 MED ORDER — BEVACIZUMAB CHEMO INJECTION 1.25MG/0.05ML SYRINGE FOR KALEIDOSCOPE
1.2500 mg | INTRAVITREAL | Status: AC | PRN
  Administered 2020-07-19: 1.25 mg via INTRAVITREAL

## 2020-07-19 NOTE — Assessment & Plan Note (Signed)
Vitreomacular traction may cause vision loss from anatomic distortion to the center of the vision, the macula.  If visual function is symptomatic or threatened, therapy may be needed.  Surgical intervention offers the highest chance of visual stability and improvement.  Distortion of the macula anatomy may cause splitting of the retinal layers, termed foveomacular retinoschisis, which can cause more permanent vision loss.  Epiretinal membranes may also be associated.  Macular hole may also develop if vitreomacular traction progresses. The minor form of this condition is Vitreomacular adhesion, which is a natural change in the aging process of the eye, which requires observation only.  OS, stable, will observe

## 2020-07-19 NOTE — Assessment & Plan Note (Signed)
OD, improved region inferior to the fovea with less intraretinal CME and thickening, currently at 6-week interval will repeat today and examination in 6 weeks

## 2020-07-19 NOTE — Patient Instructions (Signed)
She will report any new onset visual acuity declines, distortions or changes

## 2020-07-19 NOTE — Progress Notes (Signed)
07/19/2020     CHIEF COMPLAINT Patient presents for Retina Follow Up   HISTORY OF PRESENT ILLNESS: [name] is a 79 y.o. female who presents to the clinic today for:   HPI    Retina Follow Up    Patient presents with  Wet AMD.  In right eye.  This started 5 weeks ago.  Severity is mild.  Duration of 5 weeks.  Since onset it is stable.          Comments    5 Week AMD F/U OU, poss Avastin OD  Pt c/o needing to use magnifying glass more often to be able to read up close.       Last edited by [name], [place] on 07/19/2020  8:20 AM. (History)      Referring physician: Alcus Dad, MD [place],  [place] [address]  HISTORICAL INFORMATION:   Selected notes from the MEDICAL RECORD NUMBER    Lab Results  Component Value Date   HGBA1C 6.5 06/02/2020     CURRENT MEDICATIONS: No current outpatient medications on file. (Ophthalmic Drugs)   No current facility-administered medications for this visit. (Ophthalmic Drugs)   Current Outpatient Medications (Other)  Medication Sig  . acetaminophen (TYLENOL 8 HOUR) 650 MG CR tablet Take 1 tablet (650 mg total) by mouth every 8 (eight) hours as needed for pain.  Marland Kitchen apixaban (ELIQUIS) 5 MG TABS tablet Take 1 tablet (5 mg total) by mouth 2 (two) times daily.  Marland Kitchen atorvastatin (LIPITOR) 40 MG tablet TAKE 1 TABLET BY MOUTH  DAILY AT 6 PM.  . carvedilol (COREG) 6.25 MG tablet TAKE 1 TABLET BY MOUTH  TWICE DAILY WITH A MEAL  . cholecalciferol (VITAMIN D) 1000 UNITS tablet Take 1,000 Units by mouth daily.   . diclofenac Sodium (VOLTAREN) 1 % GEL Apply 2 g topically 4 (four) times daily. (Patient not taking: Reported on 06/02/2020)  . DULoxetine (CYMBALTA) 20 MG capsule Take 1 capsule (20 mg total) by mouth daily.  . ferrous sulfate 325 (65 FE) MG tablet TAKE 1 TABLET BY MOUTH EVERY DAY WITH BREAKFAST  . gabapentin (NEURONTIN) 100 MG capsule Take 1 capsule (100 mg total) by mouth at bedtime.  Marland Kitchen glucose blood (ONE TOUCH  ULTRA TEST) test strip Use three times a day  . Lancets (ONETOUCH DELICA PLUS [id number]) MISC 1 application by Does not apply route daily.  . nitroGLYCERIN (NITROSTAT) 0.4 MG SL tablet Place 1 tablet (0.4 mg total) under the tongue every 5 (five) minutes as needed for chest pain (up to 3 doses).  . potassium chloride SA (KLOR-CON) 20 MEQ tablet Take 1 tablet (20 mEq total) by mouth 3 (three) times daily.  Marland Kitchen rOPINIRole (REQUIP) 0.5 MG tablet TAKE 2 TABLETS BY MOUTH AT  BEDTIME  . torsemide (DEMADEX) 20 MG tablet Take 80mg  in the AM and 40mg  in the PM  . vitamin B-12 (CYANOCOBALAMIN) 1000 MCG tablet Take 1 tablet (1,000 mcg total) by mouth daily.   No current facility-administered medications for this visit. (Other)      REVIEW OF SYSTEMS:    ALLERGIES Allergies  Allergen Reactions  . Benazepril Other (See Comments)    Unknown reaction at age 57-65 - possibly dizziness  . Ozempic (0.25 Or 0.5 Mg-Dose) [Semaglutide(0.25 Or 0.5mg -Dos)] Diarrhea    Gi intollerance  . Angiotensin Receptor Blockers Other (See Comments)    Hypotension reaction  . Fe-Succ-C-Thre-B12-Des Stomach Other (See Comments)    Unknown reaction  .  Sulfamethoxazole-Trimethoprim Other (See Comments)    Unknown reaction    PAST MEDICAL HISTORY Past Medical History:  Diagnosis Date  . ACC/AHA stage C congestive heart failure due to ischemic cardiomyopathy ([place]) 01/31/2009   Qualifier: Diagnosis of  By: Olevia Perches, MD, Glenetta Hew   . ANXIETY 01/31/2009   Qualifier: Diagnosis of  By: Lovette Cliche, CNA, Christy    . Arthritis    "qwhere" (03/30/2016)  . Benign essential HTN   . CAD (coronary artery disease) 05/2008   a. s/p CABG in 2009.  Marland Kitchen Cerebrovascular accident (CVA) due to embolism of right middle cerebral artery ([place])   . Chronic anticoagulation   . Chronic kidney disease (CKD), stage III (moderate)   . Chronic lower back pain   . Chronic pain syndrome   . Chronic systolic CHF (congestive heart failure) ([place])     . DM type 2 with diabetic peripheral neuropathy ([place])   . Facial weakness, post-stroke   . Gastrointestinal hemorrhage 03/26/2017  . Hemiparesis ([place])   . History of CVA with residual deficit 03/26/2017  . Hyperlipidemia   . Hypertension   . Ischemic cardiomyopathy   . Longstanding persistent atrial fibrillation ([place])    a. Postoperative in 2009;  S/P transesophageal echocardiography-guided cardioversion, previously on Amiodarone and Coumadin. b. recurrent in April 2013 -> pt elected rate control strategy, initially Coumadin -> had stroke with subtherapeutic INR in 03/2016 and transitioned to Eliquis.  . Morbid obesity ([place]) 03/26/2017  . OSA on CPAP    severe OSA with AHI 34/hr  . Persistent atrial fibrillation ([place])   . Thrombocytopenia ([place])    Past Surgical History:  Procedure Laterality Date  . APPENDECTOMY  1946  . CATARACT EXTRACTION Right 2012  . COLONOSCOPY WITH PROPOFOL Left 03/27/2017   Procedure: COLONOSCOPY WITH PROPOFOL;  Surgeon: Ronnette Juniper, MD;  Location: [facility];  Service: Gastroenterology;  Laterality: Left;  . CORONARY ANGIOPLASTY WITH STENT PLACEMENT  2009   "put 2 stents in"  . HERNIA REPAIR    . IR LUMBAR Hunt W/IMG GUIDE  10/08/2018  . JOINT REPLACEMENT    . TEE WITH CARDIOVERSION     Postoperative in 2009;  S/P transesophageal echocardiography-guided cardioversion,  . TOTAL KNEE ARTHROPLASTY Right 1994  . VENTRAL HERNIA REPAIR  10/1999   With mesh    FAMILY HISTORY Family History  Problem Relation Age of Onset  . Clotting disorder Mother        Cerebral hemorrhage  . Emphysema Father        COD  . Lung cancer Brother   . Heart disease Neg Hx     SOCIAL HISTORY Social History   Tobacco Use  . Smoking status: Never Smoker  . Smokeless tobacco: Never Used  Vaping Use  . Vaping Use: Never used  Substance Use Topics  . Alcohol use: No  . Drug use: No         OPHTHALMIC EXAM:  Base Eye Exam    Visual Acuity (ETDRS)       Right Left   Dist cc 20/40 20/25 -2   Dist ph cc NI    Correction: Glasses       Tonometry (Tonopen, 8:21 AM)      Right Left   Pressure 11 10       Pupils      Pupils Dark Light Shape React APD   Right PERRL 3 2 Round Brisk None   Left PERRL 3 2 Round Brisk None  Visual Fields (Counting fingers)      Left Right    Full Full       Extraocular Movement      Right Left    Full Full       Neuro/Psych    Oriented x3: Yes   Mood/Affect: Normal       Dilation    Both eyes: 1.0% Mydriacyl, 2.5% Phenylephrine @ 8:24 AM        Slit Lamp and Fundus Exam    External Exam      Right Left   External Normal Normal       Slit Lamp Exam      Right Left   Lids/Lashes Normal Normal   Conjunctiva/Sclera White and quiet White and quiet   Cornea Clear Clear   Anterior Chamber Deep and quiet Deep and quiet   Iris Round and reactive Round and reactive   Lens Posterior chamber intraocular lens Posterior chamber intraocular lens   Anterior Vitreous Normal Normal       Fundus Exam      Right Left   Posterior Vitreous Posterior vitreous detachment    Disc Normal    C/D Ratio 0.5    Macula Soft drusen, Retinal pigment epithelial mottling, no macular thickening    Vessels NPDR-Severe    Periphery Normal           IMAGING AND PROCEDURES  Imaging and Procedures for 07/19/20  OCT, Retina - OU - Both Eyes       Right Eye Quality was good. Scan locations included subfoveal. Central Foveal Thickness: 224. Progression has improved. Findings include abnormal foveal contour, no SRF, retinal drusen , intraretinal fluid.   Left Eye Quality was good. Scan locations included subfoveal. Central Foveal Thickness: 271. Progression has been stable. Findings include vitreous traction, abnormal foveal contour, retinal drusen , no SRF.   Notes OD, improved CNVM and retinal thickening inferior to the fovea, currently at 6-week interval exam.  We will repeat injection Avastin today  and examination in 6 weeks OU, We will repeat injection OD today as region inferior to the fovea continues to improve       Intravitreal Injection, Pharmacologic Agent - OD - Right Eye       Time Out 07/19/2020. 8:57 AM. Confirmed correct patient, procedure, site, and patient consented.   Anesthesia Topical anesthesia was used. Anesthetic medications included Akten 3.5%.   Procedure Preparation included 10% betadine to eyelids, Ofloxacin . A 30 gauge needle was used.   Injection:  1.25 mg Bevacizumab (AVASTIN) SOLN   NDC: [address]-7   Route: Intravitreal, Site: Right Eye, Waste: 0 mg  Post-op Post injection exam found visual acuity of at least counting fingers. The patient tolerated the procedure well. There were no complications. The patient received written and verbal post procedure care education. Post injection medications were not given.                 ASSESSMENT/PLAN:  Exudative age-related macular degeneration of right eye with active choroidal neovascularization (HCC) OD, improved region inferior to the fovea with less intraretinal CME and thickening, currently at 6-week interval will repeat today and examination in 6 weeks  Vitreomacular adhesion of left eye Vitreomacular traction may cause vision loss from anatomic distortion to the center of the vision, the macula.  If visual function is symptomatic or threatened, therapy may be needed.  Surgical intervention offers the highest chance of visual stability and improvement.  Distortion of the macula anatomy  may cause splitting of the retinal layers, termed foveomacular retinoschisis, which can cause more permanent vision loss.  Epiretinal membranes may also be associated.  Macular hole may also develop if vitreomacular traction progresses. The minor form of this condition is Vitreomacular adhesion, which is a natural change in the aging process of the eye, which requires observation only.  OS, stable, will  observe      ICD-10-CM   1. Exudative age-related macular degeneration of right eye with active choroidal neovascularization (HCC)  H35.3211 OCT, Retina - OU - Both Eyes    Intravitreal Injection, Pharmacologic Agent - OD - Right Eye    Bevacizumab (AVASTIN) SOLN 1.25 mg  2. Vitreomacular adhesion of left eye  H43.822     1.OD, improved condition of wet ARMD, at 6-week interval, will repeat injection today and examination in 6 weeks again  2.  OS with minor vitreal foveal traction, inner retina schisis, no active impact on acuity  3.  Ophthalmic Meds Ordered this visit:  Meds ordered this encounter  Medications  . Bevacizumab (AVASTIN) SOLN 1.25 mg       Return in about 6 weeks (around 08/30/2020) for dilate, AVASTIN OCT, OD.  Patient Instructions  She will report any new onset visual acuity declines, distortions or changes    Explained the diagnoses, plan, and follow up with the patient and they expressed understanding.  Patient expressed understanding of the importance of proper follow up care.   Clent Demark Zyra Parrillo M.D. Diseases & Surgery of the Retina and Vitreous [facility] 07/19/20     Abbreviations: M myopia (nearsighted); A astigmatism; H hyperopia (farsighted); P presbyopia; Mrx spectacle prescription;  CTL contact lenses; OD right eye; OS left eye; OU both eyes  XT exotropia; ET esotropia; PEK punctate epithelial keratitis; PEE punctate epithelial erosions; DES dry eye syndrome; MGD meibomian gland dysfunction; ATs artificial tears; PFAT's preservative free artificial tears; [place] nuclear sclerotic cataract; PSC posterior subcapsular cataract; ERM epi-retinal membrane; PVD posterior vitreous detachment; RD retinal detachment; DM diabetes mellitus; DR diabetic retinopathy; NPDR non-proliferative diabetic retinopathy; PDR proliferative diabetic retinopathy; CSME clinically significant macular edema; DME diabetic macular edema; dbh dot blot hemorrhages; CWS  cotton wool spot; POAG primary open angle glaucoma; C/D cup-to-disc ratio; HVF humphrey visual field; GVF goldmann visual field; OCT optical coherence tomography; IOP intraocular pressure; BRVO Branch retinal vein occlusion; CRVO central retinal vein occlusion; CRAO central retinal artery occlusion; BRAO branch retinal artery occlusion; RT retinal tear; SB scleral buckle; PPV pars plana vitrectomy; VH Vitreous hemorrhage; PRP panretinal laser photocoagulation; IVK intravitreal kenalog; VMT vitreomacular traction; MH Macular hole;  NVD neovascularization of the disc; NVE neovascularization elsewhere; AREDS age related eye disease study; ARMD age related macular degeneration; POAG primary open angle glaucoma; EBMD epithelial/anterior basement membrane dystrophy; ACIOL anterior chamber intraocular lens; IOL intraocular lens; PCIOL posterior chamber intraocular lens; Phaco/IOL phacoemulsification with intraocular lens placement; Eugenio Saenz photorefractive keratectomy; LASIK laser assisted in situ keratomileusis; HTN hypertension; DM diabetes mellitus; COPD chronic obstructive pulmonary disease

## 2020-07-20 ENCOUNTER — Ambulatory Visit (HOSPITAL_COMMUNITY)
Admission: RE | Admit: 2020-07-20 | Discharge: 2020-07-20 | Disposition: A | Discharge status: Home / Self Care | Payer: Medicare Other | Source: Ambulatory Visit | Attending: Cardiology | Admitting: Cardiology

## 2020-07-20 VITALS — BP 130/85 | HR 60 | Wt 232.2 lb

## 2020-07-20 DIAGNOSIS — N183 Chronic kidney disease, stage 3 unspecified: Secondary | ICD-10-CM | POA: Diagnosis not present

## 2020-07-20 DIAGNOSIS — I255 Ischemic cardiomyopathy: Secondary | ICD-10-CM | POA: Insufficient documentation

## 2020-07-20 DIAGNOSIS — E1122 Type 2 diabetes mellitus with diabetic chronic kidney disease: Secondary | ICD-10-CM | POA: Insufficient documentation

## 2020-07-20 DIAGNOSIS — I130 Hypertensive heart and chronic kidney disease with heart failure and stage 1 through stage 4 chronic kidney disease, or unspecified chronic kidney disease: Secondary | ICD-10-CM | POA: Diagnosis not present

## 2020-07-20 DIAGNOSIS — Z79899 Other long term (current) drug therapy: Secondary | ICD-10-CM | POA: Insufficient documentation

## 2020-07-20 DIAGNOSIS — I5022 Chronic systolic (congestive) heart failure: Secondary | ICD-10-CM | POA: Diagnosis not present

## 2020-07-20 DIAGNOSIS — Z8673 Personal history of transient ischemic attack (TIA), and cerebral infarction without residual deficits: Secondary | ICD-10-CM | POA: Diagnosis not present

## 2020-07-20 DIAGNOSIS — G4733 Obstructive sleep apnea (adult) (pediatric): Secondary | ICD-10-CM | POA: Insufficient documentation

## 2020-07-20 DIAGNOSIS — E785 Hyperlipidemia, unspecified: Secondary | ICD-10-CM | POA: Diagnosis not present

## 2020-07-20 DIAGNOSIS — Z951 Presence of aortocoronary bypass graft: Secondary | ICD-10-CM | POA: Diagnosis not present

## 2020-07-20 DIAGNOSIS — Z7901 Long term (current) use of anticoagulants: Secondary | ICD-10-CM | POA: Diagnosis not present

## 2020-07-20 DIAGNOSIS — E1142 Type 2 diabetes mellitus with diabetic polyneuropathy: Secondary | ICD-10-CM | POA: Diagnosis not present

## 2020-07-20 DIAGNOSIS — I2510 Atherosclerotic heart disease of native coronary artery without angina pectoris: Secondary | ICD-10-CM | POA: Insufficient documentation

## 2020-07-20 DIAGNOSIS — I272 Pulmonary hypertension, unspecified: Secondary | ICD-10-CM | POA: Insufficient documentation

## 2020-07-20 DIAGNOSIS — I482 Chronic atrial fibrillation, unspecified: Secondary | ICD-10-CM | POA: Diagnosis not present

## 2020-07-20 LAB — BASIC METABOLIC PANEL
Anion gap: 11 (ref 5–15)
BUN: 66 mg/dL — ABNORMAL HIGH (ref 8–23)
CO2: 26 mmol/L (ref 22–32)
Calcium: 10 mg/dL (ref 8.9–10.3)
Chloride: 101 mmol/L (ref 98–111)
Creatinine, Ser: 1.76 mg/dL — ABNORMAL HIGH (ref 0.44–1.00)
GFR calc Af Amer: 31 mL/min — ABNORMAL LOW (ref >60)
GFR calc non Af Amer: 27 mL/min — ABNORMAL LOW (ref >60)
Glucose, Bld: 128 mg/dL — ABNORMAL HIGH (ref 70–99)
Potassium: 4.6 mmol/L (ref 3.5–5.1)
Sodium: 138 mmol/L (ref 135–145)

## 2020-07-20 LAB — LIPID PANEL
Cholesterol: 112 mg/dL (ref 0–200)
HDL: 59 mg/dL (ref >40)
LDL Cholesterol: 45 mg/dL (ref 0–99)
Total CHOL/HDL Ratio: 1.9 RATIO
Triglycerides: 41 mg/dL (ref <150)
VLDL: 8 mg/dL (ref 0–40)

## 2020-07-20 NOTE — Progress Notes (Signed)
ReDS Vest / Clip - 07/20/20 0900      ReDS Vest / Clip   Station Marker D    Ruler Value 36    ReDS Value Range Low volume    ReDS Actual Value 31    Anatomical Comments sitting

## 2020-07-20 NOTE — Progress Notes (Signed)
Advanced Heart Failure Clinic Note   Primary Care: Dr. Shan Levans Primary Cardiologist: Dr. Angelena Form HF Cardiology: Dr. Aundra Dubin  HPI:  [name] is a 79 y.o. female with history of CAD status post CABG 2009, ischemic cardiomyopathy with chronic systolic CHF, persistent atrial fibrillation with stroke in 03/2016 when INR was subtherapeutic, stage III CKD, morbid obesity, pulmonary hypertension, diabetes, GI bleed 03/2017, and severe sleep apnea.   Seen in [facility] clinics 09/10/18 and 09/24/18, both times with volume overload and marked peripheral edema with skin breakdown. Torsemide increased at each visit. Echo in 7/19 showed EF 45-50% with mild RV dilation/mild RV systolic dysfunction and D-shaped interventricular septum.    AKI earlier in 7/20 in setting of excessive torsemide, she had increased dose to 60 qam/40 qpm on her own.  This was cut back to 40 mg daily and losartan and spironolactone were stopped.   Echo in 9/20 showed EF 45% with mildly decreased RV systolic function, PASP 46 mmHg with dilated IVC.   She returns today for followup of CHF.  Weight has gone back up 20 lbs. She is not exercising as much and is eating poorly per her report. She is not walking as much because of right shoulder pain when she uses her walker.  She still tries to do 8 laps/day around her house. No dyspnea walking around her house, has to use walker for stability.  No falls.  No chest pain.  She reports tingling in her fingers bilaterally.  She does not have CPAP yet, has not heard from Dr. Theodosia Blender office.   REDS clip: 31%  Labs (12/18): K 3.9, Creatinine 1.65 Labs (5/19): K 4.1, creatinine 1.7 Labs (6/19): hgb 8.2 Labs (8/19): K 4.3, creatinine 1.73 Labs (9/19): K 4, creatinine 1.56 Labs (11/19): K 4.7, creatinine 2.04 Labs (12/19): creatinine 1.44 Labs (7/20): K 5, creatinine 2.49 => 1.86, Hgb 11.1 Labs (8/20): K 3.9, creatinine 1.79 Labs (9/20): LDL 50 Labs (11/20): K 3.8, creatinine 2.01 Labs  (2/21): K 4, creatinine 1.9 Labs (7/21): K 4.2, creatinine 1.84, hgb 11.2  Review of systems complete and found to be negative unless listed in HPI.    Past Medical History 1. Chronic systolic CHF: Ischemic cardiomyopathy with prominent RV failure.   - Echo (11/18): LVEF 35-40%, severe RV dilation, severe RAE, severe TR.  - Echo (7/19): EF 45-50%, diffuse hypokinesis, mild RV dilation with mildly decreased RV systolic function, D-shaped interventricular septum suggestive of RV pressure/volume overload, mild MR, moderate TR, PASP 49 mmhg.  - Echo (9/20): EF 45%, mild LV dilation, mild RV dilation with mildly decreased systolic function, PASP 46 mmHg, IVC dilated.  2. CAD: CABG 2009. No angiography since that time.   3. HTN 4. Atrial fibrillation: Chronic since 2013.  5. CKD stage 3 6. Morbid obesity 7. OSA 8. Chronic venous stasis with RLE wound/Bullae 9. CVA 5/18.   Current Outpatient Medications  Medication Sig Dispense Refill  . acetaminophen (TYLENOL 8 HOUR) 650 MG CR tablet Take 1 tablet (650 mg total) by mouth every 8 (eight) hours as needed for pain. 30 tablet 0  . apixaban (ELIQUIS) 5 MG TABS tablet Take 1 tablet (5 mg total) by mouth 2 (two) times daily. 60 tablet 11  . atorvastatin (LIPITOR) 40 MG tablet TAKE 1 TABLET BY MOUTH  DAILY AT 6 PM. 90 tablet 3  . carvedilol (COREG) 6.25 MG tablet TAKE 1 TABLET BY MOUTH  TWICE DAILY WITH A MEAL 60 tablet 11  . cholecalciferol (VITAMIN D)  1000 UNITS tablet Take 1,000 Units by mouth daily.     . DULoxetine (CYMBALTA) 20 MG capsule Take 1 capsule (20 mg total) by mouth daily. 90 capsule 3  . ferrous sulfate 325 (65 FE) MG tablet TAKE 1 TABLET BY MOUTH EVERY DAY WITH BREAKFAST 90 tablet 2  . gabapentin (NEURONTIN) 100 MG capsule Take 1 capsule (100 mg total) by mouth at bedtime. 60 capsule 3  . glucose blood (ONE TOUCH ULTRA TEST) test strip Use three times a day 100 each 3  . Lancets (ONETOUCH DELICA PLUS GURKYH06C) MISC 1 application by  Does not apply route daily. 100 each 6  . nitroGLYCERIN (NITROSTAT) 0.4 MG SL tablet Place 1 tablet (0.4 mg total) under the tongue every 5 (five) minutes as needed for chest pain (up to 3 doses). 15 tablet 5  . potassium chloride SA (KLOR-CON) 20 MEQ tablet Take 1 tablet (20 mEq total) by mouth 3 (three) times daily. 270 tablet 3  . rOPINIRole (REQUIP) 0.5 MG tablet TAKE 2 TABLETS BY MOUTH AT  BEDTIME 180 tablet 3  . torsemide (DEMADEX) 20 MG tablet Take 80mg  in the AM and 40mg  in the PM 540 tablet 3  . vitamin B-12 (CYANOCOBALAMIN) 1000 MCG tablet Take 1 tablet (1,000 mcg total) by mouth daily. 90 tablet 0   No current facility-administered medications for this encounter.   Allergies  Allergen Reactions  . Benazepril Other (See Comments)    Unknown reaction at age 30-65 - possibly dizziness  . Ozempic (0.25 Or 0.5 Mg-Dose) [Semaglutide(0.25 Or 0.5mg -Dos)] Diarrhea    Gi intollerance  . Angiotensin Receptor Blockers Other (See Comments)    Hypotension reaction  . Fe-Succ-C-Thre-B12-Des Stomach Other (See Comments)    Unknown reaction  . Sulfamethoxazole-Trimethoprim Other (See Comments)    Unknown reaction   Social History   Socioeconomic History  . Marital status: Widowed    Spouse name: Not on file  . Number of children: Not on file  . Years of education: Not on file  . Highest education level: Not on file  Occupational History  . Not on file  Tobacco Use  . Smoking status: Never Smoker  . Smokeless tobacco: Never Used  Vaping Use  . Vaping Use: Never used  Substance and Sexual Activity  . Alcohol use: No  . Drug use: No  . Sexual activity: Not Currently  Other Topics Concern  . Not on file  Social History Narrative   Widowed   Lives alone in [place]   2 children   Not routinely exercising   Social Determinants of Health   Financial Resource Strain:   . Difficulty of Paying Living Expenses: Not on file  Food Insecurity:   . Worried About Charity fundraiser  in the Last Year: Not on file  . Ran Out of Food in the Last Year: Not on file  Transportation Needs:   . Lack of Transportation (Medical): Not on file  . Lack of Transportation (Non-Medical): Not on file  Physical Activity:   . Days of Exercise per Week: Not on file  . Minutes of Exercise per Session: Not on file  Stress:   . Feeling of Stress : Not on file  Social Connections:   . Frequency of Communication with Friends and Family: Not on file  . Frequency of Social Gatherings with Friends and Family: Not on file  . Attends Religious Services: Not on file  . Active Member of Clubs or Organizations: Not on file  .  Attends Archivist Meetings: Not on file  . Marital Status: Not on file  Intimate Partner Violence:   . Fear of Current or Ex-Partner: Not on file  . Emotionally Abused: Not on file  . Physically Abused: Not on file  . Sexually Abused: Not on file   Family History  Problem Relation Age of Onset  . Clotting disorder Mother        Cerebral hemorrhage  . Emphysema Father        COD  . Lung cancer Brother   . Heart disease Neg Hx    Vitals:   07/20/20 0915  BP: 130/85  Pulse: 60  SpO2: 99%  Weight: 105.3 kg (232 lb 3.2 oz)     Wt Readings from Last 3 Encounters:  07/20/20 105.3 kg (232 lb 3.2 oz)  07/04/20 102.3 kg (225 lb 9.6 oz)  06/16/20 99.2 kg (218 lb 9.6 oz)    PHYSICAL EXAM: General: NAD Neck: No JVD, no thyromegaly or thyroid nodule.  Lungs: Clear to auscultation bilaterally with normal respiratory effort. CV: Nondisplaced PMI.  Heart irregular S1/S2, no S3/S4, no murmur.  No peripheral edema.  No carotid bruit.  Normal pedal pulses.  Abdomen: Soft, nontender, no hepatosplenomegaly, no distention.  Skin: Intact without lesions or rashes.  Neurologic: Alert and oriented x 3.  Psych: Normal affect. Extremities: No clubbing or cyanosis.  HEENT: Normal.   ASSESSMENT & PLAN:  1. Chronic systolic CHF with prominent RV failure: Ischemic  cardiomyopathy.  Echo in 7/19 showed LV EF 45-50% (improved) with mildly dilated/mildly dysfunction RV.  D-shaped septum suggested RV pressure/volume overload.  Echo in 9/20 showed EF 45%, mildly dysfunctional RV, PASP 46 mmHg with dilated IVC.  Weight is up, but this seems more caloric as she does not look volume overloaded by exam or REDS clip.  NYHA class II.     - Continue torsemide 80 qam/40 qpm.  BMET today.  - Continue Coreg 6.25 mg bid.   - She is not on ACEI/ARB/spironolactone with elevated creatinine.  - We have discussed Cardiomems but she has not been admitted in the last year so cannot get.  - With primarily diastolic CHF and evidence for peripheral neuropathy (bilateral hands), I would like to rule out cardiac amyloidosis => will arrange for PYP scan and check myeloma panel and urine immunofixation.  2. CAD s/p CABG: No chest pain.  - Continue atorvastatin 40 mg daily. Check lipids today.    - No ASA with stable CAD on Eliquis.  3. Atrial fibrillation: Chronic. Given long-term atrial fibrillation, she is unlikely to successfully cardiovert.  - Continue Eliquis.   4. CKD stage 3: BMET today.   5. H/o CVA: Continue Eliquis.   6. OSA: She still has not heard back about her CPAP.  Will contact Dr. Theodosia Blender office.  7. HTN: BP controlled.    Followup 4 mos  Loralie Champagne 07/20/2020

## 2020-07-20 NOTE — Patient Instructions (Signed)
Labs done today, we will call you for abnormal results  You have been ordered a PYP Scan.  This is done in the Radiology Department of [facility].  When you come for this test please plan to be there 2-3 hours.  Please call our office in January 2022 to schedule your follow up appointment  If you have any questions or concerns before your next appointment please send [place] a message through [place] or call our office at [phone].    TO LEAVE A MESSAGE FOR THE NURSE SELECT OPTION 2, PLEASE LEAVE A MESSAGE INCLUDING: . YOUR NAME . DATE OF BIRTH . CALL BACK NUMBER . REASON FOR CALL**this is important as we prioritize the call backs  [place] AS LONG AS YOU CALL BEFORE 4:00 PM  At the [facility], you and your health needs are our priority. As part of our continuing mission to provide you with exceptional heart care, we have created designated Provider Care Teams. These Care Teams include your primary Cardiologist (physician) and Advanced Practice Providers (APPs- Physician Assistants and Nurse Practitioners) who all work together to provide you with the care you need, when you need it.   You may see any of the following providers on your designated Care Team at your next follow up: Marland Kitchen Dr Glori Bickers . Dr Loralie Champagne . Darrick Grinder, NP . Lyda Jester, PA . Audry Riles, PharmD   Please be sure to bring in all your medications bottles to every appointment.

## 2020-07-21 ENCOUNTER — Telehealth: Payer: Self-pay | Admitting: *Deleted

## 2020-07-21 NOTE — Telephone Encounter (Signed)
Patient needed a chin strap for her machine and one was ordered. Patient said she could not tighten the chin strap enough to keep her mouth closed. Patient says the dme came out and showed her how to operate her machine but she could not remember how they did it. She called them to come back out but they were not able to go back out.a second time so she just quit using her unit and packed it up in the closet. I reached out to [facility] and was told the patient has not gotten supplies from them since 2019.

## 2020-07-21 NOTE — Telephone Encounter (Signed)
Patient has a cpap but not using it because she does not understand how. The dme went out initially but does not go out a second time. Patient was encouraged to take the unit to the dme.

## 2020-07-21 NOTE — Telephone Encounter (Signed)
-----   Message from Sueanne Margarita, MD sent at 07/20/2020  9:27 PM EDT ----- Gae Bon  Please call patient and DME to find out what is going on with Patient's CPAP.  The last time I saw her she was using PAP after changing out her mask.  She was supposed to have a download 3 weeks after I saw her and then I have not seen her since late 2019.  Not sure why she does not currently have a PAP device  Traci ----- Message ----- From: Larey Dresser, MD Sent: 07/20/2020   9:09 PM EDT To: Sueanne Margarita, MD  Traci, this patient has been waiting for CPAP.  Can your office expedite it? Think it has been a while. Thanks.

## 2020-07-22 ENCOUNTER — Telehealth (HOSPITAL_COMMUNITY): Payer: Self-pay

## 2020-07-22 LAB — MULTIPLE MYELOMA PANEL, SERUM
Albumin SerPl Elph-Mcnc: 3.5 g/dL (ref 2.9–4.4)
Albumin/Glob SerPl: 1.1 (ref 0.7–1.7)
Alpha 1: 0.3 g/dL (ref 0.0–0.4)
Alpha2 Glob SerPl Elph-Mcnc: 0.7 g/dL (ref 0.4–1.0)
B-Globulin SerPl Elph-Mcnc: 1 g/dL (ref 0.7–1.3)
Gamma Glob SerPl Elph-Mcnc: 1.2 g/dL (ref 0.4–1.8)
Globulin, Total: 3.2 g/dL (ref 2.2–3.9)
IgA: 505 mg/dL — ABNORMAL HIGH (ref 64–422)
IgG (Immunoglobin G), Serum: 1184 mg/dL (ref 586–1602)
IgM (Immunoglobulin M), Srm: 17 mg/dL — ABNORMAL LOW (ref 26–217)
Total Protein ELP: 6.7 g/dL (ref 6.0–8.5)

## 2020-07-22 LAB — IMMUNOFIXATION, URINE

## 2020-07-22 NOTE — Telephone Encounter (Signed)
-----   Message from Larey Dresser, MD sent at 07/22/2020  5:03 PM EDT ----- No monoclonal gammopathy

## 2020-07-22 NOTE — Telephone Encounter (Signed)
Malena Edman, RN  07/22/2020 5:10 PM EDT Back to Top    Patient was advised and verbalized understanding

## 2020-07-22 NOTE — Telephone Encounter (Signed)
Patient says she will start back on therapy on Monday 07/25/20 and I will set a 4 week appt. ===View-only below this line=== ----- Message ----- From: Sueanne Margarita, MD Sent: 07/22/2020  11:34 AM EDT To: Larey Dresser, MD, Freada Bergeron, CMA Subject: RE: PATIENT HAS A CPAP BUT NOT USING IT        Ok lets get her on PAP and then followup with me in 4 weeks  Traci ----- Message ----- From: Freada Bergeron, CMA Sent: 07/22/2020  10:59 AM EDT To: Sueanne Margarita, MD Subject: RE: PATIENT HAS A CPAP BUT NOT USING IT        Patient says Dr Aundra Dubin wants her back on her therapy so she said I guess I will go back on it. I told her she has to want to use her cpap herself. She complained of having to go to the bathroom several times a night.  Patient states she found the mask she used for her sleep study and she really liked that one. Per dme she will have to use the supplies she has to get compliant for 30 days before she can get any other supplies. Gae Bon ----- Message ----- From: Sueanne Margarita, MD Sent: 07/21/2020  10:07 PM EDT To: Larey Dresser, MD, Freada Bergeron, CMA Subject: RE: PATIENT HAS A CPAP BUT NOT USING IT        Is she willing to get back on CPAP therapy

## 2020-07-25 ENCOUNTER — Telehealth (HOSPITAL_COMMUNITY): Payer: Self-pay

## 2020-07-25 NOTE — Telephone Encounter (Signed)
Malena Edman, RN  07/25/2020 2:05 PM EDT Back to Top    Patient advised and verbalized understanding

## 2020-07-25 NOTE — Telephone Encounter (Signed)
-----   Message from Larey Dresser, MD sent at 07/24/2020 10:37 AM EDT ----- negative

## 2020-07-25 NOTE — Telephone Encounter (Signed)
[employer] received the OOP and have not reprocessed the information yet. I asked for this to be expedited considering it was sent in 13 days ago. The representative stated she would be able to process the application for me and to call back tomorrow for a determination.  Will follow up.

## 2020-07-26 ENCOUNTER — Other Ambulatory Visit: Payer: Self-pay

## 2020-07-26 ENCOUNTER — Telehealth: Payer: Self-pay | Admitting: Cardiology

## 2020-07-26 ENCOUNTER — Other Ambulatory Visit (INDEPENDENT_AMBULATORY_CARE_PROVIDER_SITE_OTHER): Payer: Medicare Other | Admitting: Orthotics

## 2020-07-26 NOTE — Telephone Encounter (Signed)
Patient would like speak to you about her sleep apnea. She stated she is returning your call from last week.

## 2020-07-26 NOTE — Telephone Encounter (Signed)
Return Call: Patient says she started wearing her cpap on Monday night 07/25/20 to get compliant for 30 days to start back receiving supplies from [facility].

## 2020-07-28 NOTE — Telephone Encounter (Signed)
Advanced Heart Failure Patient Advocate Encounter   Patient was approved to receive Eliquis from [employer].  Effective dates: 07/25/20 through 11/11/20  Called patient and her first package was delivered today.  Charlann Boxer, CPhT

## 2020-08-15 ENCOUNTER — Other Ambulatory Visit: Payer: Self-pay

## 2020-08-15 ENCOUNTER — Encounter (HOSPITAL_COMMUNITY)
Admission: RE | Admit: 2020-08-15 | Discharge: 2020-08-15 | Disposition: A | Discharge status: Home / Self Care | Payer: Medicare Other | Source: Ambulatory Visit | Attending: Cardiology | Admitting: Cardiology

## 2020-08-15 DIAGNOSIS — I509 Heart failure, unspecified: Secondary | ICD-10-CM | POA: Diagnosis not present

## 2020-08-15 DIAGNOSIS — I5022 Chronic systolic (congestive) heart failure: Secondary | ICD-10-CM | POA: Insufficient documentation

## 2020-08-15 MED ORDER — TECHNETIUM TC 99M PYROPHOSPHATE
21.9000 | Freq: Once | INTRAVENOUS | Status: AC | PRN
  Administered 2020-08-15: 21.9 via INTRAVENOUS
  Filled 2020-08-15: qty 22

## 2020-08-24 ENCOUNTER — Other Ambulatory Visit: Payer: Self-pay

## 2020-08-24 ENCOUNTER — Ambulatory Visit (INDEPENDENT_AMBULATORY_CARE_PROVIDER_SITE_OTHER): Payer: Medicare Other

## 2020-08-24 ENCOUNTER — Telehealth: Payer: Self-pay | Admitting: Cardiology

## 2020-08-24 DIAGNOSIS — Z23 Encounter for immunization: Secondary | ICD-10-CM | POA: Diagnosis not present

## 2020-08-24 NOTE — Telephone Encounter (Signed)
    Pt is calling, she wanted to speak with Gae Bon, she said her 30 days cpap was done yesterday and would like to speak with her.

## 2020-08-24 NOTE — Progress Notes (Signed)
Patient presents in nurse clinic for Flu Vaccine.   Vaccine administered LD without complication.   See admin for details.

## 2020-08-24 NOTE — Telephone Encounter (Signed)
Patient has used her cpap for 30 days but she does not think it is helping her. I have messaged dr Radford Pax and I have sent a recent download for advisement.

## 2020-08-29 ENCOUNTER — Other Ambulatory Visit: Payer: Self-pay

## 2020-08-29 ENCOUNTER — Ambulatory Visit (INDEPENDENT_AMBULATORY_CARE_PROVIDER_SITE_OTHER): Payer: Medicare Other | Admitting: Ophthalmology

## 2020-08-29 ENCOUNTER — Encounter (INDEPENDENT_AMBULATORY_CARE_PROVIDER_SITE_OTHER): Payer: Self-pay | Admitting: Ophthalmology

## 2020-08-29 DIAGNOSIS — H353211 Exudative age-related macular degeneration, right eye, with active choroidal neovascularization: Secondary | ICD-10-CM

## 2020-08-29 DIAGNOSIS — E113491 Type 2 diabetes mellitus with severe nonproliferative diabetic retinopathy without macular edema, right eye: Secondary | ICD-10-CM

## 2020-08-29 DIAGNOSIS — H353222 Exudative age-related macular degeneration, left eye, with inactive choroidal neovascularization: Secondary | ICD-10-CM

## 2020-08-29 DIAGNOSIS — H43822 Vitreomacular adhesion, left eye: Secondary | ICD-10-CM | POA: Diagnosis not present

## 2020-08-29 MED ORDER — BEVACIZUMAB CHEMO INJECTION 1.25MG/0.05ML SYRINGE FOR KALEIDOSCOPE
1.2500 mg | INTRAVITREAL | Status: AC | PRN
  Administered 2020-08-29: 1.25 mg via INTRAVITREAL

## 2020-08-29 NOTE — Assessment & Plan Note (Signed)
The nature of severe nonproliferative diabetic retinopathy discussed with the patient as well as the need for more frequent follow up and likely progression to proliferative disease in the near future. The options of continued observation versus panretinal photocoagulation at this time were reviewed as well as the risks, benefits, and alternatives. More recent option includes the use of ocular injectable medications to slow progression of retinal disease. Tight control of glucose, blood pressure, and serum lipid levels were recommended under the direction of general physician or endocrinologist, as well as avoidance of smoking and maintenance of normal body weight. The 2-year risk of progression to proliferative diabetic retinopathy is 60%.

## 2020-08-29 NOTE — Patient Instructions (Signed)
Patient instructed to contact the office promptly for new visual acuity decline or distortions from either eye

## 2020-08-29 NOTE — Progress Notes (Signed)
08/29/2020     CHIEF COMPLAINT Patient presents for Retina Follow Up   HISTORY OF PRESENT ILLNESS: [name] is a 79 y.o. female who presents to the clinic today for:   HPI    Retina Follow Up    Patient presents with  Wet AMD.  In right eye.  Severity is moderate.  Duration of 6 weeks.  Since onset it is stable.  I, the attending physician,  performed the HPI with the patient and updated documentation appropriately.          Comments    6 Week Wet AMD f\u OD. Possible Avastin OD. OCT  Pt states she has trouble reading small print on the TV. Pt states she needs a magnifying glass to read the newspaper.  BGL: 161       Last edited by [name] on 08/29/2020  8:42 AM. (History)      Referring physician: Alcus Dad, MD [place],  [place] [id number]  HISTORICAL INFORMATION:   Selected notes from the MEDICAL RECORD NUMBER    Lab Results  Component Value Date   HGBA1C 6.5 06/02/2020     CURRENT MEDICATIONS: No current outpatient medications on file. (Ophthalmic Drugs)   No current facility-administered medications for this visit. (Ophthalmic Drugs)   Current Outpatient Medications (Other)  Medication Sig  . acetaminophen (TYLENOL 8 HOUR) 650 MG CR tablet Take 1 tablet (650 mg total) by mouth every 8 (eight) hours as needed for pain.  Marland Kitchen apixaban (ELIQUIS) 5 MG TABS tablet Take 1 tablet (5 mg total) by mouth 2 (two) times daily.  Marland Kitchen atorvastatin (LIPITOR) 40 MG tablet TAKE 1 TABLET BY MOUTH  DAILY AT 6 PM.  . carvedilol (COREG) 6.25 MG tablet TAKE 1 TABLET BY MOUTH  TWICE DAILY WITH A MEAL  . cholecalciferol (VITAMIN D) 1000 UNITS tablet Take 1,000 Units by mouth daily.   . DULoxetine (CYMBALTA) 20 MG capsule Take 1 capsule (20 mg total) by mouth daily.  . ferrous sulfate 325 (65 FE) MG tablet TAKE 1 TABLET BY MOUTH EVERY DAY WITH BREAKFAST  . gabapentin (NEURONTIN) 100 MG capsule Take 1 capsule (100 mg total) by mouth at bedtime.  Marland Kitchen  glucose blood (ONE TOUCH ULTRA TEST) test strip Use three times a day  . Lancets (ONETOUCH DELICA PLUS [id number]) MISC 1 application by Does not apply route daily.  . nitroGLYCERIN (NITROSTAT) 0.4 MG SL tablet Place 1 tablet (0.4 mg total) under the tongue every 5 (five) minutes as needed for chest pain (up to 3 doses).  . potassium chloride SA (KLOR-CON) 20 MEQ tablet Take 1 tablet (20 mEq total) by mouth 3 (three) times daily.  Marland Kitchen rOPINIRole (REQUIP) 0.5 MG tablet TAKE 2 TABLETS BY MOUTH AT  BEDTIME  . torsemide (DEMADEX) 20 MG tablet Take 80mg  in the AM and 40mg  in the PM  . vitamin B-12 (CYANOCOBALAMIN) 1000 MCG tablet Take 1 tablet (1,000 mcg total) by mouth daily.   No current facility-administered medications for this visit. (Other)      REVIEW OF SYSTEMS:    ALLERGIES Allergies  Allergen Reactions  . Benazepril Other (See Comments)    Unknown reaction at age 55-65 - possibly dizziness  . Ozempic (0.25 Or 0.5 Mg-Dose) [Semaglutide(0.25 Or 0.5mg -Dos)] Diarrhea    Gi intollerance  . Angiotensin Receptor Blockers Other (See Comments)    Hypotension reaction  . Fe-Succ-C-Thre-B12-Des Stomach Other (See Comments)    Unknown reaction  . Sulfamethoxazole-Trimethoprim  Other (See Comments)    Unknown reaction    PAST MEDICAL HISTORY Past Medical History:  Diagnosis Date  . ACC/AHA stage C congestive heart failure due to ischemic cardiomyopathy ([place]) 01/31/2009   Qualifier: Diagnosis of  By: Olevia Perches, MD, Glenetta Hew   . ANXIETY 01/31/2009   Qualifier: Diagnosis of  By: Lovette Cliche, CNA, Christy    . Arthritis    "qwhere" (03/30/2016)  . Benign essential HTN   . CAD (coronary artery disease) 05/2008   a. s/p CABG in 2009.  Marland Kitchen Cerebrovascular accident (CVA) due to embolism of right middle cerebral artery ([place])   . Chronic anticoagulation   . Chronic kidney disease (CKD), stage III (moderate) (HCC)   . Chronic lower back pain   . Chronic pain syndrome   . Chronic systolic CHF  (congestive heart failure) ([place])   . DM type 2 with diabetic peripheral neuropathy ([place])   . Facial weakness, post-stroke   . Gastrointestinal hemorrhage 03/26/2017  . Hemiparesis ([place])   . History of CVA with residual deficit 03/26/2017  . Hyperlipidemia   . Hypertension   . Ischemic cardiomyopathy   . Longstanding persistent atrial fibrillation ([place])    a. Postoperative in 2009;  S/P transesophageal echocardiography-guided cardioversion, previously on Amiodarone and Coumadin. b. recurrent in April 2013 -> pt elected rate control strategy, initially Coumadin -> had stroke with subtherapeutic INR in 03/2016 and transitioned to Eliquis.  . Morbid obesity ([place]) 03/26/2017  . OSA on CPAP    severe OSA with AHI 34/hr  . Persistent atrial fibrillation ([place])   . Thrombocytopenia ([place])    Past Surgical History:  Procedure Laterality Date  . APPENDECTOMY  1946  . CATARACT EXTRACTION Right 2012  . COLONOSCOPY WITH PROPOFOL Left 03/27/2017   Procedure: COLONOSCOPY WITH PROPOFOL;  Surgeon: Ronnette Juniper, MD;  Location: [facility];  Service: Gastroenterology;  Laterality: Left;  . CORONARY ANGIOPLASTY WITH STENT PLACEMENT  2009   "put 2 stents in"  . HERNIA REPAIR    . IR LUMBAR [place] W/IMG GUIDE  10/08/2018  . JOINT REPLACEMENT    . TEE WITH CARDIOVERSION     Postoperative in 2009;  S/P transesophageal echocardiography-guided cardioversion,  . TOTAL KNEE ARTHROPLASTY Right 1994  . VENTRAL HERNIA REPAIR  10/1999   With mesh    FAMILY HISTORY Family History  Problem Relation Age of Onset  . Clotting disorder Mother        Cerebral hemorrhage  . Emphysema Father        COD  . Lung cancer Brother   . Heart disease Neg Hx     SOCIAL HISTORY Social History   Tobacco Use  . Smoking status: Never Smoker  . Smokeless tobacco: Never Used  Vaping Use  . Vaping Use: Never used  Substance Use Topics  . Alcohol use: No  . Drug use: No         OPHTHALMIC EXAM:  Base Eye  Exam    Visual Acuity (Snellen - Linear)      Right Left   Dist cc 20/40 -1 20/30 -1   Dist ph cc NI    Correction: Glasses       Tonometry (Tonopen, 8:46 AM)      Right Left   Pressure 15 12       Pupils      Pupils Dark Light Shape React APD   Right PERRL 3 2 Round Brisk None   Left PERRL 3 2 Round Brisk None  Visual Fields (Counting fingers)      Left Right    Full Full       Neuro/Psych    Oriented x3: Yes   Mood/Affect: Normal       Dilation    Right eye: 1.0% Mydriacyl, 2.5% Phenylephrine @ 8:46 AM        Slit Lamp and Fundus Exam    External Exam      Right Left   External Normal Normal       Slit Lamp Exam      Right Left   Lids/Lashes Normal Normal   Conjunctiva/Sclera White and quiet White and quiet   Cornea Clear Clear   Anterior Chamber Deep and quiet Deep and quiet   Iris Round and reactive Round and reactive   Lens Posterior chamber intraocular lens Posterior chamber intraocular lens   Anterior Vitreous Normal Normal       Fundus Exam      Right Left   Posterior Vitreous Posterior vitreous detachment    Disc Normal    C/D Ratio 0.5    Macula Soft drusen, Retinal pigment epithelial mottling, no macular thickening    Vessels NPDR-Severe    Periphery Normal           IMAGING AND PROCEDURES  Imaging and Procedures for 08/29/20  OCT, Retina - OU - Both Eyes       Right Eye Quality was good. Scan locations included subfoveal. Central Foveal Thickness: 235. Progression has improved. Findings include abnormal foveal contour, no SRF, retinal drusen , intraretinal fluid.   Left Eye Quality was good. Scan locations included subfoveal. Central Foveal Thickness: 304. Progression has been stable. Findings include vitreous traction, abnormal foveal contour, retinal drusen , no SRF, cystoid macular edema.   Notes OD, improved CNVM and retinal thickening inferior to the fovea, currently at 6-week interval exam.  We will repeat injection  Avastin today and examination in 6 weeks OU, We will repeat injection OD today as region inferior to the fovea continues to improve  OS, with inner retinal foveal distortion from vitreal foveal traction, minor CME, yet the outer retina is intact and thus visual acuity unaffected.  We will continue to observe       Intravitreal Injection, Pharmacologic Agent - OD - Right Eye       Time Out 08/29/2020. 9:27 AM. Confirmed correct patient, procedure, site, and patient consented.   Anesthesia Topical anesthesia was used. Anesthetic medications included [place] 3.5%.   Procedure Preparation included 10% betadine to eyelids, Ofloxacin . A 30 gauge needle was used.   Injection:  1.25 mg Bevacizumab (AVASTIN) SOLN   NDC: 70360-001-02, Lot: [id number]   Route: Intravitreal, Site: Right Eye, Waste: 0 mg  Post-op Post injection exam found visual acuity of at least counting fingers. The patient tolerated the procedure well. There were no complications. The patient received written and verbal post procedure care education. Post injection medications were not given.                 ASSESSMENT/PLAN:  Exudative age-related macular degeneration of left eye with inactive choroidal neovascularization (HCC) No changes by OCT evaluation  Exudative age-related macular degeneration of right eye with active choroidal neovascularization (HCC) OD much less active currently at 6-week follow-up interval.  Repeat injection OD today  Vitreomacular adhesion of left eye No outer retinal changes, will continue to monitor and dilate OU next  Severe nonproliferative diabetic retinopathy of right eye ([place]) The nature of severe  nonproliferative diabetic retinopathy discussed with the patient as well as the need for more frequent follow up and likely progression to proliferative disease in the near future. The options of continued observation versus panretinal photocoagulation at this time were reviewed as well  as the risks, benefits, and alternatives. More recent option includes the use of ocular injectable medications to slow progression of retinal disease. Tight control of glucose, blood pressure, and serum lipid levels were recommended under the direction of general physician or endocrinologist, as well as avoidance of smoking and maintenance of normal body weight. The 2-year risk of progression to proliferative diabetic retinopathy is 60%.      ICD-10-CM   1. Exudative age-related macular degeneration of right eye with active choroidal neovascularization (HCC)  H35.3211 OCT, Retina - OU - Both Eyes    Intravitreal Injection, Pharmacologic Agent - OD - Right Eye    Bevacizumab (AVASTIN) SOLN 1.25 mg  2. Exudative age-related macular degeneration of left eye with inactive choroidal neovascularization ([place])  H35.3222   3. Vitreomacular adhesion of left eye  H43.822   4. Severe nonproliferative diabetic retinopathy of right eye without macular edema associated with type 2 diabetes mellitus (HCC)  B15.1761 Intravitreal Injection, Pharmacologic Agent - OD - Right Eye    Bevacizumab (AVASTIN) SOLN 1.25 mg    1.  Visual acuity OD stabilized and CNVM stabilized on intravitreal Avastin currently 6-week interval.  2.  We will continue to monitor left eye for foveal changes from vitreal macular traction and adhesion.  3.  No progression of diabetic retinopathy OD.  Light OU next  Ophthalmic Meds Ordered this visit:  Meds ordered this encounter  Medications  . Bevacizumab (AVASTIN) SOLN 1.25 mg       Return in about 6 weeks (around 10/10/2020) for DILATE OU, AVASTIN OCT, OD.  Patient Instructions  Patient instructed to contact the office promptly for new visual acuity decline or distortions from either eye    Explained the diagnoses, plan, and follow up with the patient and they expressed understanding.  Patient expressed understanding of the importance of proper follow up care.   Clent Demark Betzabe Bevans  M.D. Diseases & Surgery of the Retina and Vitreous Retina & Diabetic [place] 08/29/20     Abbreviations: M myopia (nearsighted); A astigmatism; H hyperopia (farsighted); P presbyopia; Mrx spectacle prescription;  CTL contact lenses; OD right eye; OS left eye; OU both eyes  XT exotropia; ET esotropia; PEK punctate epithelial keratitis; PEE punctate epithelial erosions; DES dry eye syndrome; MGD meibomian gland dysfunction; ATs artificial tears; PFAT's preservative free artificial tears; [place] nuclear sclerotic cataract; PSC posterior subcapsular cataract; ERM epi-retinal membrane; PVD posterior vitreous detachment; RD retinal detachment; DM diabetes mellitus; DR diabetic retinopathy; NPDR non-proliferative diabetic retinopathy; PDR proliferative diabetic retinopathy; CSME clinically significant macular edema; DME diabetic macular edema; dbh dot blot hemorrhages; CWS cotton wool spot; POAG primary open angle glaucoma; C/D cup-to-disc ratio; HVF humphrey visual field; GVF goldmann visual field; OCT optical coherence tomography; IOP intraocular pressure; BRVO Branch retinal vein occlusion; CRVO central retinal vein occlusion; CRAO central retinal artery occlusion; BRAO branch retinal artery occlusion; RT retinal tear; SB scleral buckle; PPV pars plana vitrectomy; VH Vitreous hemorrhage; PRP panretinal laser photocoagulation; IVK intravitreal kenalog; VMT vitreomacular traction; MH Macular hole;  NVD neovascularization of the disc; NVE neovascularization elsewhere; AREDS age related eye disease study; ARMD age related macular degeneration; POAG primary open angle glaucoma; EBMD epithelial/anterior basement membrane dystrophy; ACIOL anterior chamber intraocular lens; IOL intraocular lens; PCIOL  posterior chamber intraocular lens; Phaco/IOL phacoemulsification with intraocular lens placement; [place] photorefractive keratectomy; LASIK laser assisted in situ keratomileusis; HTN hypertension; DM diabetes mellitus; COPD  chronic obstructive pulmonary disease

## 2020-08-29 NOTE — Assessment & Plan Note (Signed)
No outer retinal changes, will continue to monitor and dilate OU next

## 2020-08-29 NOTE — Assessment & Plan Note (Signed)
OD much less active currently at 6-week follow-up interval.  Repeat injection OD today

## 2020-08-29 NOTE — Assessment & Plan Note (Signed)
No changes by OCT evaluation

## 2020-08-30 ENCOUNTER — Other Ambulatory Visit: Payer: Self-pay | Admitting: *Deleted

## 2020-08-30 ENCOUNTER — Encounter (INDEPENDENT_AMBULATORY_CARE_PROVIDER_SITE_OTHER): Payer: Medicare Other | Admitting: Ophthalmology

## 2020-08-30 DIAGNOSIS — I5022 Chronic systolic (congestive) heart failure: Secondary | ICD-10-CM

## 2020-08-30 MED ORDER — CARVEDILOL 6.25 MG PO TABS: ORAL_TABLET | ORAL | 11 refills | Status: DC

## 2020-08-30 NOTE — Progress Notes (Signed)
Virtual Visit via Telephone Note   This visit type was conducted due to national recommendations for restrictions regarding the COVID-19 Pandemic (e.g. social distancing) in an effort to limit this patient's exposure and mitigate transmission in our community.  Due to her co-morbid illnesses, this patient is at least at moderate risk for complications without adequate follow up.  This format is felt to be most appropriate for this patient at this time.  The patient did not have access to video technology/had technical difficulties with video requiring transitioning to audio format only (telephone).  All issues noted in this document were discussed and addressed.  No physical exam could be performed with this format.  Please refer to the patient's chart for her  consent to telehealth for [facility]  Evaluation Performed:  Follow-up visit  This visit type was conducted due to national recommendations for restrictions regarding the COVID-19 Pandemic (e.g. social distancing).  This format is felt to be most appropriate for this patient at this time.  All issues noted in this document were discussed and addressed.  No physical exam was performed (except for noted visual exam findings with Video Visits).  Please refer to the patient's chart (MyChart message for video visits and phone note for telephone visits) for the patient's consent to telehealth for [facility].  Date:  08/31/2020   ID:  [name], [name] [birth year], MRN [id number]  Patient Location:  Home  Provider location:   [facility]  PCP:  Alcus Dad, MD  Cardiologist:  Fransico Him, MD  Electrophysiologist:  None   Chief Complaint:  OSA  History of Present Illness:    [name] is a 79 y.o. female who presents via audio/video conferencing for a telehealth visit today.    This is an 79yo female with a hx of severe OSA with an AHI of 34/hr.  This was ordered due to excessive daytime sleepiness and snoring. She  subsequently underwent CPAP titration and is now on CPAP. I last saw her in 2019 and at that time she was not using her CPAP due to issues with epistaxis.  She had tried a FFM but did not tolerate it.  She was fine with the pressure.  I encouraged her to try to use the FFM and repeat a download but she never followed through..    Since I saw her last she apparently stopped using her device and told Dr. Aundra Dubin she was waiting for a new PAP device but I had not ordered a new device.  According to the DME the patient did not understand how to use her device and the DME had gone out to go over it with her and she was encouraged to take her machine to the DME in early Sept.  Unfortunately, due to noncompliance she could not get new supplies and had to use the supplies she has for 30 days and be compliant prior to getting more supplies from them.   She told my sleep nurse that she would start back on PAP on 07/25/2020.  She called back stating that she did not think it was helping her.  She is now here for followup . She says that she is claustraphobic and has problems keeping the mask on.  She also has problems with dry mouth.  She talked to her DME and they told her that they do not do home calls.  She does not understand how to manage the buttons on the device to turn it on. She  has had a some problems with her mouth opening and was prescribed a chin strap but it does not help.  She uses a walker and does not get around much and cannot take the device by herself to the DME.  The patient does not have symptoms concerning for COVID-19 infection (fever, chills, cough, or new shortness of breath).    Prior CV studies:   The following studies were reviewed today:  PAP compliance download  Past Medical History:  Diagnosis Date  . ACC/AHA stage C congestive heart failure due to ischemic cardiomyopathy ([place]) 01/31/2009   Qualifier: Diagnosis of  By: Olevia Perches, MD, Glenetta Hew   . ANXIETY 01/31/2009    Qualifier: Diagnosis of  By: Lovette Cliche, CNA, Christy    . Arthritis    "qwhere" (03/30/2016)  . Benign essential HTN   . CAD (coronary artery disease) 05/2008   a. s/p CABG in 2009.  Marland Kitchen Cerebrovascular accident (CVA) due to embolism of right middle cerebral artery ([place])   . Chronic anticoagulation   . Chronic kidney disease (CKD), stage III (moderate) (HCC)   . Chronic lower back pain   . Chronic pain syndrome   . Chronic systolic CHF (congestive heart failure) ([place])   . DM type 2 with diabetic peripheral neuropathy ([place])   . Facial weakness, post-stroke   . Gastrointestinal hemorrhage 03/26/2017  . Hemiparesis ([place])   . History of CVA with residual deficit 03/26/2017  . Hyperlipidemia   . Hypertension   . Ischemic cardiomyopathy   . Longstanding persistent atrial fibrillation ([place])    a. Postoperative in 2009;  S/P transesophageal echocardiography-guided cardioversion, previously on Amiodarone and Coumadin. b. recurrent in April 2013 -> pt elected rate control strategy, initially Coumadin -> had stroke with subtherapeutic INR in 03/2016 and transitioned to Eliquis.  . Morbid obesity ([place]) 03/26/2017  . OSA on CPAP    severe OSA with AHI 34/hr  . Persistent atrial fibrillation ([place])   . Thrombocytopenia ([place])    Past Surgical History:  Procedure Laterality Date  . APPENDECTOMY  1946  . CATARACT EXTRACTION Right 2012  . COLONOSCOPY WITH PROPOFOL Left 03/27/2017   Procedure: COLONOSCOPY WITH PROPOFOL;  Surgeon: Ronnette Juniper, MD;  Location: [place];  Service: Gastroenterology;  Laterality: Left;  . CORONARY ANGIOPLASTY WITH STENT PLACEMENT  2009   "put 2 stents in"  . HERNIA REPAIR    . IR LUMBAR [place] W/IMG GUIDE  10/08/2018  . JOINT REPLACEMENT    . TEE WITH CARDIOVERSION     Postoperative in 2009;  S/P transesophageal echocardiography-guided cardioversion,  . TOTAL KNEE ARTHROPLASTY Right 1994  . VENTRAL HERNIA REPAIR  10/1999   With mesh     Current Meds  Medication  Sig  . acetaminophen (TYLENOL 8 HOUR) 650 MG CR tablet Take 1 tablet (650 mg total) by mouth every 8 (eight) hours as needed for pain.  Marland Kitchen apixaban (ELIQUIS) 5 MG TABS tablet Take 1 tablet (5 mg total) by mouth 2 (two) times daily.  Marland Kitchen atorvastatin (LIPITOR) 40 MG tablet TAKE 1 TABLET BY MOUTH  DAILY AT 6 PM.  . carvedilol (COREG) 6.25 MG tablet TAKE 1 TABLET BY MOUTH  TWICE DAILY WITH A MEAL  . cholecalciferol (VITAMIN D) 1000 UNITS tablet Take 1,000 Units by mouth daily.   . DULoxetine (CYMBALTA) 20 MG capsule Take 1 capsule (20 mg total) by mouth daily.  . ferrous sulfate 325 (65 FE) MG tablet TAKE 1 TABLET BY MOUTH EVERY DAY WITH BREAKFAST  .  gabapentin (NEURONTIN) 100 MG capsule Take 1 capsule (100 mg total) by mouth at bedtime.  Marland Kitchen glucose blood (ONE TOUCH ULTRA TEST) test strip Use three times a day  . Lancets (ONETOUCH DELICA PLUS [id number]) MISC 1 application by Does not apply route daily.  . nitroGLYCERIN (NITROSTAT) 0.4 MG SL tablet Place 1 tablet (0.4 mg total) under the tongue every 5 (five) minutes as needed for chest pain (up to 3 doses).  . potassium chloride SA (KLOR-CON) 20 MEQ tablet Take 1 tablet (20 mEq total) by mouth 3 (three) times daily.  Marland Kitchen rOPINIRole (REQUIP) 0.5 MG tablet TAKE 2 TABLETS BY MOUTH AT  BEDTIME  . torsemide (DEMADEX) 20 MG tablet Take 80mg  in the AM and 40mg  in the PM  . vitamin B-12 (CYANOCOBALAMIN) 1000 MCG tablet Take 1 tablet (1,000 mcg total) by mouth daily.     Allergies:   Benazepril, Ozempic (0.25 or 0.5 mg-dose) [semaglutide(0.25 or 0.5mg -dos)], Angiotensin receptor blockers, Fe-succ-c-thre-b12-des stomach, and Sulfamethoxazole-trimethoprim   Social History   Tobacco Use  . Smoking status: Never Smoker  . Smokeless tobacco: Never Used  Vaping Use  . Vaping Use: Never used  Substance Use Topics  . Alcohol use: No  . Drug use: No     Family Hx: The patient's family history includes Clotting disorder in her mother; Emphysema in her father;  Lung cancer in her brother. There is no history of Heart disease.  ROS:   Please see the history of present illness.     All other systems reviewed and are negative.   Labs/Other Tests and Data Reviewed:    Recent Labs: 06/03/2020: Hemoglobin 11.2; Platelets 127 07/20/2020: BUN 66; Creatinine, Ser 1.76; Potassium 4.6; Sodium 138   Recent Lipid Panel Lab Results  Component Value Date/Time   CHOL 112 07/20/2020 09:56 AM   TRIG 41 07/20/2020 09:56 AM   HDL 59 07/20/2020 09:56 AM   CHOLHDL 1.9 07/20/2020 09:56 AM   LDLCALC 45 07/20/2020 09:56 AM    Wt Readings from Last 3 Encounters:  08/31/20 217 lb (98.4 kg)  07/20/20 232 lb 3.2 oz (105.3 kg)  07/04/20 225 lb 9.6 oz (102.3 kg)     Objective:    Vital Signs:  BP (!) 129/54   Pulse 71   Ht 5\' 8"  (1.727 m)   Wt 217 lb (98.4 kg)   LMP  (LMP Unknown)   SpO2 98%   BMI 32.99 kg/m     ASSESSMENT & PLAN:    1.  OSA -  The patient is tolerating PAP therapy well without any problems. The PAP download was reviewed today and showed an AHI of 0.8/hr on 14 cm H2O with 73% compliance in using more than 4 hours nightly.  The patient has been using and benefiting from PAP use and will continue to benefit from therapy.  -I have explained how the PAP device works and reviewed the readings -she seems to be confused about the readings on the device and they worry her.  She says that she is concerned that numbers on the screen dont change from day to day -I will set up an appt with the patient's DME in person and asked the patient to have her son take her to the DME for a review of the device settings  2.  Morbid Obesity -I have encouraged her to get into a routine exercise program and cut back on carbs and portions.   COVID-19 Education: The signs and symptoms of COVID-19 were discussed  with the patient and how to seek care for testing (follow up with PCP or arrange E-visit).  The importance of social distancing was discussed  today.  Patient Risk:   After full review of this patient's clinical status, I feel that they are at least moderate risk at this time.  Time:   Today, I have spent 20 minutes on telemedicine discussing medical problems including OSA and obesity and reviewing patient's chart including PAP compliance download.  Medication Adjustments/Labs and Tests Ordered: Current medicines are reviewed at length with the patient today.  Concerns regarding medicines are outlined above.  Tests Ordered: No orders of the defined types were placed in this encounter.  Medication Changes: No orders of the defined types were placed in this encounter.   Disposition:  Follow up in 1 year(s)  Signed, Fransico Him, MD  08/31/2020 9:19 AM    [facility]

## 2020-08-31 ENCOUNTER — Other Ambulatory Visit: Payer: Self-pay

## 2020-08-31 ENCOUNTER — Telehealth (INDEPENDENT_AMBULATORY_CARE_PROVIDER_SITE_OTHER): Payer: Medicare Other | Admitting: Cardiology

## 2020-08-31 ENCOUNTER — Telehealth: Payer: Self-pay | Admitting: *Deleted

## 2020-08-31 ENCOUNTER — Encounter: Payer: Self-pay | Admitting: Cardiology

## 2020-08-31 VITALS — BP 129/54 | HR 71 | Ht 68.0 in | Wt 217.0 lb

## 2020-08-31 DIAGNOSIS — G4733 Obstructive sleep apnea (adult) (pediatric): Secondary | ICD-10-CM

## 2020-08-31 NOTE — Patient Instructions (Signed)
Medication Instructions:  Your physician recommends that you continue on your current medications as directed. Please refer to the Current Medication list given to you today.  *If you need a refill on your cardiac medications before your next appointment, please call your pharmacy*  Follow-Up: At [facility], you and your health needs are our priority.  As part of our continuing mission to provide you with exceptional heart care, we have created designated Provider Care Teams.  These Care Teams include your primary Cardiologist (physician) and Advanced Practice Providers (APPs -  Physician Assistants and Nurse Practitioners) who all work together to provide you with the care you need, when you need it.  We recommend signing up for the patient portal called "MyChart".  Sign up information is provided on this After Visit Summary.  MyChart is used to connect with patients for Virtual Visits (Telemedicine).  Patients are able to view lab/test results, encounter notes, upcoming appointments, etc.  Non-urgent messages can be sent to your provider as well.   To learn more about what you can do with MyChart, go to [email or web address].    Your next appointment:   1 year(s)  The format for your next appointment:   Virtual Visit   Provider:   Traci Turner, MD    

## 2020-08-31 NOTE — Telephone Encounter (Signed)
Antonieta Iba, RN  Freada Bergeron, CMA Per Dr. Radford Pax:  "please forward this to Gae Bon - she needs her son to take her to the DME to review her device settings and explain how the device works in person. Followup with me in 1 year"   I have put in recall for 1 year follow up.

## 2020-08-31 NOTE — Telephone Encounter (Signed)
-----   Message from Sueanne Margarita, MD sent at 08/27/2020  6:06 PM EDT ----- Regarding: RE: 4 wk downooad from 07/21/20 note Good compliance and AHI ----- Message ----- From: Freada Bergeron, CMA Sent: 08/24/2020   5:34 PM EDT To: Sueanne Margarita, MD Subject: 4 wk downooad from 07/21/20 note                 Patient had get compliant to get supplies.  [name], [name] 07/25/2020 - 08/23/2020 DOB: Aug 10, 1941 Age: 79 years [address] [place], [place] Phone: [phone] Email: [email or web address] Compliance Report Usage 07/25/2020 - 08/23/2020 Usage days 30/30 days (100%) >= 4 hours 27 days (90%) < 4 hours 3 days (10%) Usage hours 207 hours 44 minutes Average usage (total days) 6 hours 55 minutes Average usage (days used) 6 hours 55 minutes Median usage (days used) 7 hours 19 minutes Total used hours (value since last reset - 08/23/2020) 943 hours AirSense 10 AutoSet Serial number [id number] Mode CPAP Set pressure 14 cmH2O EPR Fulltime EPR level 3 Therapy Leaks - L/min Median: 3.1 95th percentile: 84.0 Maximum: 102.0 Events per hour AI: 1.2 HI: 0.3 AHI: 1.5 Apnea Index Central: 0.1 Obstructive: 0.0 Unknown: 1.0 RERA Index 0.1 Cheyne-Stokes respiration (average duration per night) 0 minutes (0%) Usage - hours Printed on 08/24/2020 - ResMed AirView version 4.28.0-5.0 Page 1 of 1

## 2020-08-31 NOTE — Telephone Encounter (Signed)
Patient is aware and agreeable to AHI being within range at 1.5. Patient is aware and agreeable to being in compliance with machine usage Patient is aware and agreeable to no change in current pressures. 

## 2020-08-31 NOTE — Telephone Encounter (Signed)
Gave patient recommendations made by Dr. Radford Pax and  understanding was verbalized.

## 2020-09-01 ENCOUNTER — Ambulatory Visit (INDEPENDENT_AMBULATORY_CARE_PROVIDER_SITE_OTHER): Payer: Medicare Other | Admitting: Family Medicine

## 2020-09-01 ENCOUNTER — Other Ambulatory Visit: Payer: Self-pay

## 2020-09-01 VITALS — BP 124/70 | HR 78 | Wt 224.0 lb

## 2020-09-01 DIAGNOSIS — T426X5A Adverse effect of other antiepileptic and sedative-hypnotic drugs, initial encounter: Secondary | ICD-10-CM | POA: Diagnosis not present

## 2020-09-01 DIAGNOSIS — R399 Unspecified symptoms and signs involving the genitourinary system: Secondary | ICD-10-CM | POA: Diagnosis not present

## 2020-09-01 DIAGNOSIS — N3001 Acute cystitis with hematuria: Secondary | ICD-10-CM | POA: Diagnosis not present

## 2020-09-01 HISTORY — DX: Acute cystitis with hematuria: N30.01

## 2020-09-01 HISTORY — DX: Adverse effect of other antiepileptic and sedative-hypnotic drugs, initial encounter: T42.6X5A

## 2020-09-01 LAB — POCT UA - MICROSCOPIC ONLY
RBC, urine, microscopic: >20
WBC, Ur, HPF, POC: >20

## 2020-09-01 LAB — POCT URINALYSIS DIP (MANUAL ENTRY)
Bilirubin, UA: NEGATIVE
Glucose, UA: NEGATIVE mg/dL
Ketones, POC UA: NEGATIVE mg/dL
Nitrite, UA: NEGATIVE
Protein Ur, POC: NEGATIVE mg/dL
Spec Grav, UA: 1.01 (ref 1.010–1.025)
Urobilinogen, UA: 0.2 E.U./dL
pH, UA: 6 (ref 5.0–8.0)

## 2020-09-01 MED ORDER — CEPHALEXIN 250 MG PO CAPS: 250.0000 mg | ORAL_CAPSULE | Freq: Four times a day (QID) | ORAL | 0 refills | Status: AC

## 2020-09-01 NOTE — Progress Notes (Signed)
    SUBJECTIVE:   CHIEF COMPLAINT / HPI:   Dysuria Feels warm and irritated when she urinates foe the last week. Medications tried: none Any antibiotics in the last 30 days: none More than 3 UTIs in the last 12 months: none STD exposure: none Possibly pregnant: n/a  Symptoms Urgency: no increase Frequency: none Blood in urine: none Pain in back: none Fever: now Vaginal discharge: none  Gabapentin When waking up at night to go to the bathroom, she has felt very unsteady since starting gabapentin She has not noticed any improvement in her symptoms of neuropathy She is concerned about falling, but has not fallen yet  PERTINENT  PMH / PSH: CKD, HFrEF, atrial fibrillation, CAD, T2DM, history of stroke  OBJECTIVE:   BP 124/70   Pulse 78   Wt 224 lb (101.6 kg)   LMP  (LMP Unknown)   SpO2 99%   BMI 34.06 kg/m    Physical Exam:  General: 79 y.o. female in NAD Cardio: Irregularly irregular Lungs: CTAB, no wheezing, no rhonchi, no crackles, no IWOB on RA Abdomen: Soft, non-tender to palpation, non-distended, no CVA tenderness bilaterally Skin: warm and dry Extremities: Ambulating with a rolling walker  Results for orders placed or performed in visit on 09/01/20 (from the past 24 hour(s))  POCT urinalysis dipstick     Status: Abnormal   Collection Time: 09/01/20  8:40 AM  Result Value Ref Range   Color, UA yellow yellow   Clarity, UA cloudy (A) clear   Glucose, UA negative negative mg/dL   Bilirubin, UA negative negative   Ketones, POC UA negative negative mg/dL   Spec Grav, UA 1.010 1.010 - 1.025   Blood, UA moderate (A) negative   pH, UA 6.0 5.0 - 8.0   Protein Ur, POC negative negative mg/dL   Urobilinogen, UA 0.2 0.2 or 1.0 E.U./dL   Nitrite, UA Negative Negative   Leukocytes, UA Large (3+) (A) Negative  POCT UA - Microscopic Only     Status: Abnormal   Collection Time: 09/01/20  8:40 AM  Result Value Ref Range   WBC, Ur, HPF, POC >20    RBC, urine,  microscopic >20    Bacteria, U Microscopic FEW    Epithelial cells, urine per micros 0-3      ASSESSMENT/PLAN:   Acute cystitis with hematuria UTI with large leukocytes and blood, given her symptoms we will go ahead and treat for UTI.  We will also order a follow-up urine culture.  Given that her GFR is less than 30, will renally dose her Keflex to 250 mg every 6 hours for 5 days.  She was given return precautions and advised to follow-up if she does not have any improvement in her symptoms, if she acutely worsens, develops fever, develop CVA tenderness.  Gabapentin adverse reaction Given patient's age and concern for instability when she is waking at night to urinate from diuretic use, as well as lack of improvement with low-dose gabapentin, would not recommend increasing gabapentin to increase her therapeutic response, will go ahead and discontinue.  Patient is in agreements with this and feels that she would rather feel stable that have improvement in her neuropathy.  We will send a message to PCP to ensure that she is aware of this.  Patient advised to follow-up with PCP as soon as she is able.     Cleophas Dunker, [place]

## 2020-09-01 NOTE — Assessment & Plan Note (Addendum)
UTI with large leukocytes and blood (she is on Eliquis), given her symptoms we will go ahead and treat for UTI.  We will also order a follow-up urine culture.  Given that her GFR is less than 30, will renally dose her Keflex to 250 mg every 6 hours for 5 days.  She was given return precautions and advised to follow-up if she does not have any improvement in her symptoms, if she acutely worsens, develops fever, develop CVA tenderness.

## 2020-09-01 NOTE — Patient Instructions (Signed)
Thank you for coming to see me today. It was a pleasure. Today we talked about:   You have a urinary tract infection.  We will treat this with Keflex, take this 4 times daily for the next 5 days.  If you do not have improvement in your symptoms, or if you develop fever or increased pain, please come back to see [place] right away.  We will send your urine for culture, if we need to change her antibiotic we will call you.  Stop taking gabapentin since it is making you feel unsteady at night.  I will send a message to your doctor to let them know that we will discontinue it.  Please follow-up with Dr. Rock Nephew as scheduled.  If you have any questions or concerns, please do not hesitate to call the office at [phone].  Best,   [place] Constable, DO

## 2020-09-01 NOTE — Assessment & Plan Note (Signed)
Given patient's age and concern for instability when she is waking at night to urinate from diuretic use, as well as lack of improvement with low-dose gabapentin, would not recommend increasing gabapentin to increase her therapeutic response, will go ahead and discontinue.  Patient is in agreements with this and feels that she would rather feel stable that have improvement in her neuropathy.  We will send a message to PCP to ensure that she is aware of this.  Patient advised to follow-up with PCP as soon as she is able.

## 2020-09-06 ENCOUNTER — Other Ambulatory Visit: Payer: Self-pay | Admitting: Family Medicine

## 2020-09-06 DIAGNOSIS — N3001 Acute cystitis with hematuria: Secondary | ICD-10-CM

## 2020-09-06 LAB — URINE CULTURE

## 2020-09-06 MED ORDER — AMOXICILLIN-POT CLAVULANATE 500-125 MG PO TABS: 1.0000 | ORAL_TABLET | Freq: Two times a day (BID) | ORAL | 0 refills | Status: AC

## 2020-09-07 DIAGNOSIS — G4733 Obstructive sleep apnea (adult) (pediatric): Secondary | ICD-10-CM | POA: Diagnosis not present

## 2020-09-08 ENCOUNTER — Ambulatory Visit: Payer: Medicare Other | Admitting: Podiatry

## 2020-09-12 ENCOUNTER — Encounter: Payer: Self-pay | Admitting: Podiatry

## 2020-09-12 ENCOUNTER — Other Ambulatory Visit: Payer: Self-pay

## 2020-09-12 ENCOUNTER — Ambulatory Visit: Payer: Medicare HMO | Admitting: Podiatry

## 2020-09-12 DIAGNOSIS — E1169 Type 2 diabetes mellitus with other specified complication: Secondary | ICD-10-CM

## 2020-09-12 DIAGNOSIS — E1151 Type 2 diabetes mellitus with diabetic peripheral angiopathy without gangrene: Secondary | ICD-10-CM

## 2020-09-12 DIAGNOSIS — B351 Tinea unguium: Secondary | ICD-10-CM | POA: Diagnosis not present

## 2020-09-12 NOTE — Progress Notes (Signed)
  Subjective:  Patient ID: [name], female    DOB: [birth year],  MRN: [id number]  Chief Complaint  Patient presents with  . Nail Problem    trim nails    79 y.o. female presents with the above complaint. History confirmed with patient.   Objective:  Physical Exam: warm, good capillary refill, nail exam onychomycosis of the toenails, no trophic changes or ulcerative lesions, normal DP and non-palp PT pulses, and normal sensory exam. Left Foot: bunion deformity noted and hammertoes  Right Foot: bunion deformity noted and hammertoes   No images are attached to the encounter.  Assessment:   1. Onychomycosis of multiple toenails with type 2 diabetes mellitus and peripheral angiopathy ([name])    Plan:  Patient was evaluated and treated and all questions answered.  Onychomycosis, Diabetes and PAD -Nails debrdied as below   Procedure: Nail Debridement Rationale: Patient meets criteria for routine foot care due to PAD Type of Debridement: manual, sharp debridement. Instrumentation: Nail nipper, rotary burr. Number of Nails: 10      No follow-ups on file.

## 2020-09-14 ENCOUNTER — Other Ambulatory Visit (HOSPITAL_COMMUNITY): Payer: Self-pay | Admitting: *Deleted

## 2020-09-14 ENCOUNTER — Telehealth (HOSPITAL_COMMUNITY): Payer: Self-pay | Admitting: Pharmacy Technician

## 2020-09-14 DIAGNOSIS — I5022 Chronic systolic (congestive) heart failure: Secondary | ICD-10-CM

## 2020-09-14 MED ORDER — CARVEDILOL 6.25 MG PO TABS: ORAL_TABLET | ORAL | 3 refills | Status: DC

## 2020-09-14 MED ORDER — APIXABAN 5 MG PO TABS: 5.0000 mg | ORAL_TABLET | Freq: Two times a day (BID) | ORAL | 3 refills | Status: DC

## 2020-09-14 MED ORDER — POTASSIUM CHLORIDE CRYS ER 20 MEQ PO TBCR: EXTENDED_RELEASE_TABLET | ORAL | 3 refills | Status: DC

## 2020-09-14 MED ORDER — TORSEMIDE 20 MG PO TABS
ORAL_TABLET | ORAL | 3 refills | Status: DC
Start: 2020-09-14 — End: 2020-09-29

## 2020-09-14 MED ORDER — ATORVASTATIN CALCIUM 40 MG PO TABS: ORAL_TABLET | ORAL | 3 refills | Status: DC

## 2020-09-14 NOTE — Telephone Encounter (Signed)
The patient has new insurance and was concerned about her medications co-pays. Spoke at length about the pricing of her medication. She participates in [employer]. Her generic 90 day co-pays should be $3.70 and brand should be $9.20. Everything I saw seemed to match up with this information as well.  Advised the patient to cal me with any issues.  Charlann Boxer, CPhT

## 2020-09-15 ENCOUNTER — Other Ambulatory Visit: Payer: Self-pay

## 2020-09-15 MED ORDER — ONETOUCH DELICA PLUS LANCET33G MISC
1.0000 | Freq: Every day | 6 refills | Status: DC
Start: 2020-09-15 — End: 2022-10-10

## 2020-09-15 MED ORDER — DULOXETINE HCL 20 MG PO CPEP: 20.0000 mg | ORAL_CAPSULE | Freq: Every day | ORAL | 3 refills | Status: DC

## 2020-09-15 MED ORDER — ROPINIROLE HCL 0.5 MG PO TABS: ORAL_TABLET | ORAL | 3 refills | Status: DC

## 2020-09-15 MED ORDER — ONETOUCH ULTRA BLUE VI STRP
ORAL_STRIP | 3 refills | Status: DC
Start: 2020-09-15 — End: 2024-06-17

## 2020-09-15 MED ORDER — ONETOUCH ULTRA 2 W/DEVICE KIT: PACK | 0 refills | Status: DC

## 2020-09-15 NOTE — Telephone Encounter (Signed)
Patient calls nurse line regarding change in [employer]. Patient reports that she is now using [facility] for prescription refills.   Patient is requesting refills on cymbalta, requip and new glucometer and supplies.   Called and spoke with representative at [facility]. Cancelled refills on requip and cymbalta.   Please send new prescriptions to [place], RN

## 2020-09-16 ENCOUNTER — Telehealth: Payer: Self-pay | Admitting: Family Medicine

## 2020-09-16 MED ORDER — VITAMIN D3 25 MCG (1000 UNIT) PO TABS: 1000.0000 [IU] | ORAL_TABLET | Freq: Every day | ORAL | 1 refills | Status: AC

## 2020-09-16 MED ORDER — VITAMIN B-12 1000 MCG PO TABS
1000.0000 ug | ORAL_TABLET | Freq: Every day | ORAL | 1 refills | Status: DC
Start: 2020-09-16 — End: 2024-05-20

## 2020-09-16 MED ORDER — FERROUS SULFATE 325 (65 FE) MG PO TABS
ORAL_TABLET | ORAL | 1 refills | Status: DC
Start: 2020-09-16 — End: 2024-05-25

## 2020-09-16 NOTE — Telephone Encounter (Signed)
Patient is calling and would like for Dr. Rock Nephew to call her. She said she has some personal things she would like to discuss with her. We tried to schedule her for an appointment but Dr. Rock Nephew is booked until December and patient said she would like to speak with her sooner.   The best call back is [phone].

## 2020-09-16 NOTE — Telephone Encounter (Signed)
Spoke with patient via telephone. She is requesting refills on her ferrous sulfate, Vit B12, and Vit D since she has new insurance and a new pharmacy. I have sent a 90 day supply to her new pharmacy.  Also scheduled patient for office visit on 10/14/2020 to discuss the ongoing tingling in her hands/feet.  Patient was very appreciative of the call.

## 2020-09-16 NOTE — Addendum Note (Signed)
Addended by: Alcus Dad on: 09/16/2020 06:13 PM   Modules accepted: Orders

## 2020-09-27 DIAGNOSIS — H353132 Nonexudative age-related macular degeneration, bilateral, intermediate dry stage: Secondary | ICD-10-CM | POA: Diagnosis not present

## 2020-09-27 DIAGNOSIS — H26493 Other secondary cataract, bilateral: Secondary | ICD-10-CM | POA: Diagnosis not present

## 2020-09-27 DIAGNOSIS — Z961 Presence of intraocular lens: Secondary | ICD-10-CM | POA: Diagnosis not present

## 2020-09-27 DIAGNOSIS — E113293 Type 2 diabetes mellitus with mild nonproliferative diabetic retinopathy without macular edema, bilateral: Secondary | ICD-10-CM | POA: Diagnosis not present

## 2020-09-27 DIAGNOSIS — H17821 Peripheral opacity of cornea, right eye: Secondary | ICD-10-CM | POA: Diagnosis not present

## 2020-09-27 DIAGNOSIS — H43811 Vitreous degeneration, right eye: Secondary | ICD-10-CM | POA: Diagnosis not present

## 2020-09-29 ENCOUNTER — Other Ambulatory Visit (HOSPITAL_COMMUNITY): Payer: Self-pay | Admitting: Cardiology

## 2020-10-03 ENCOUNTER — Telehealth: Payer: Self-pay

## 2020-10-03 NOTE — Telephone Encounter (Signed)
Patient calls nurse line regarding Duloxetine rx. Patient states that she received a three page letter in the mail regarding the risks of her taking this medication. Patients states that the letter states that she should not be taking this medication due to her CHF and diabetes. Patient is requesting virtual visit to discuss medication management in further detail.   Talbot Grumbling, RN

## 2020-10-10 ENCOUNTER — Ambulatory Visit (INDEPENDENT_AMBULATORY_CARE_PROVIDER_SITE_OTHER): Payer: Medicare HMO | Admitting: Ophthalmology

## 2020-10-10 ENCOUNTER — Other Ambulatory Visit: Payer: Self-pay

## 2020-10-10 ENCOUNTER — Encounter (INDEPENDENT_AMBULATORY_CARE_PROVIDER_SITE_OTHER): Payer: Self-pay | Admitting: Ophthalmology

## 2020-10-10 DIAGNOSIS — H353211 Exudative age-related macular degeneration, right eye, with active choroidal neovascularization: Secondary | ICD-10-CM | POA: Diagnosis not present

## 2020-10-10 DIAGNOSIS — H43822 Vitreomacular adhesion, left eye: Secondary | ICD-10-CM | POA: Diagnosis not present

## 2020-10-10 MED ORDER — BEVACIZUMAB CHEMO INJECTION 1.25MG/0.05ML SYRINGE FOR KALEIDOSCOPE
1.2500 mg | INTRAVITREAL | Status: AC | PRN
  Administered 2020-10-10: 1.25 mg via INTRAVITREAL

## 2020-10-10 NOTE — Progress Notes (Signed)
10/10/2020     CHIEF COMPLAINT Patient presents for Retina Follow Up   HISTORY OF PRESENT ILLNESS: [name] is a 79 y.o. female who presents to the clinic today for:   HPI    Retina Follow Up    Patient presents with  Wet AMD.  In right eye.  This started 6 weeks ago.  Duration of 6 weeks.  Since onset it is stable.          Comments    6 WK F/U OU, POSS AVASTIN OD   Pt reports stable vision, no new f/f, no pain or pressure.   Last A1C: 6.5 taken 07/2020   Last: 155 this AM        Last edited by Nichola Sizer D on 10/10/2020  8:40 AM. (History)      Referring physician: Alcus Dad, MD [place],  [place] [address]  HISTORICAL INFORMATION:   Selected notes from the MEDICAL RECORD NUMBER    Lab Results  Component Value Date   HGBA1C 6.5 06/02/2020     CURRENT MEDICATIONS: No current outpatient medications on file. (Ophthalmic Drugs)   No current facility-administered medications for this visit. (Ophthalmic Drugs)   Current Outpatient Medications (Other)  Medication Sig  . acetaminophen (TYLENOL 8 HOUR) 650 MG CR tablet Take 1 tablet (650 mg total) by mouth every 8 (eight) hours as needed for pain.  Marland Kitchen apixaban (ELIQUIS) 5 MG TABS tablet Take 1 tablet (5 mg total) by mouth 2 (two) times daily.  Marland Kitchen atorvastatin (LIPITOR) 40 MG tablet TAKE 1 TABLET BY MOUTH  DAILY AT 6 PM.  . Blood Glucose Monitoring Suppl (ONE TOUCH ULTRA 2) w/Device KIT Please use to check blood sugar up to three times daily.  . carvedilol (COREG) 6.25 MG tablet TAKE 1 TABLET BY MOUTH  TWICE DAILY WITH A MEAL  . cholecalciferol (VITAMIN D) 25 MCG (1000 UNIT) tablet Take 1 tablet (1,000 Units total) by mouth daily.  . DULoxetine (CYMBALTA) 20 MG capsule Take 1 capsule (20 mg total) by mouth daily.  . ferrous sulfate 325 (65 FE) MG tablet TAKE 1 TABLET BY MOUTH EVERY DAY WITH BREAKFAST  . glucose blood (ONE TOUCH ULTRA TEST) test strip Use three times a day  .  Lancets (ONETOUCH DELICA PLUS [id number]) MISC 1 application by Does not apply route daily. Please use to check blood sugar up to three times daily.  . nitroGLYCERIN (NITROSTAT) 0.4 MG SL tablet Place 1 tablet (0.4 mg total) under the tongue every 5 (five) minutes as needed for chest pain (up to 3 doses).  . potassium chloride SA (KLOR-CON) 20 MEQ tablet Take 3 tablets in the AM and 2 tablets in the PM  . rOPINIRole (REQUIP) 0.5 MG tablet TAKE 2 TABLETS BY MOUTH AT  BEDTIME  . torsemide (DEMADEX) 20 MG tablet TAKE 3 TABLETS BY MOUTH EVERY MORNING AND 2 TABLETS EVERY EVENING  . vitamin B-12 (CYANOCOBALAMIN) 1000 MCG tablet Take 1 tablet (1,000 mcg total) by mouth daily.   No current facility-administered medications for this visit. (Other)      REVIEW OF SYSTEMS:    ALLERGIES Allergies  Allergen Reactions  . Benazepril Other (See Comments)    Unknown reaction at age 92-65 - possibly dizziness  . Ozempic (0.25 Or 0.5 Mg-Dose) [Semaglutide(0.25 Or 0.5mg -Dos)] Diarrhea    Gi intollerance  . Angiotensin Receptor Blockers Other (See Comments)    Hypotension reaction  . Fe-Succ-C-Thre-B12-Des Stomach Other (See Comments)  Unknown reaction  . Sulfamethoxazole-Trimethoprim Other (See Comments)    Unknown reaction    PAST MEDICAL HISTORY Past Medical History:  Diagnosis Date  . ACC/AHA stage C congestive heart failure due to ischemic cardiomyopathy ([place]) 01/31/2009   Qualifier: Diagnosis of  By: Olevia Perches, MD, Glenetta Hew   . ANXIETY 01/31/2009   Qualifier: Diagnosis of  By: Lovette Cliche, CNA, Christy    . Arthritis    "qwhere" (03/30/2016)  . Benign essential HTN   . CAD (coronary artery disease) 05/2008   a. s/p CABG in 2009.  Marland Kitchen Cerebrovascular accident (CVA) due to embolism of right middle cerebral artery ([place])   . Chronic anticoagulation   . Chronic kidney disease (CKD), stage III (moderate) (HCC)   . Chronic lower back pain   . Chronic pain syndrome   . Chronic systolic CHF  (congestive heart failure) ([place])   . DM type 2 with diabetic peripheral neuropathy ([place])   . Facial weakness, post-stroke   . Gastrointestinal hemorrhage 03/26/2017  . Hemiparesis ([place])   . History of CVA with residual deficit 03/26/2017  . Hyperlipidemia   . Hypertension   . Ischemic cardiomyopathy   . Longstanding persistent atrial fibrillation ([place])    a. Postoperative in 2009;  S/P transesophageal echocardiography-guided cardioversion, previously on Amiodarone and Coumadin. b. recurrent in April 2013 -> pt elected rate control strategy, initially Coumadin -> had stroke with subtherapeutic INR in 03/2016 and transitioned to Eliquis.  . Morbid obesity ([place]) 03/26/2017  . OSA on CPAP    severe OSA with AHI 34/hr  . Persistent atrial fibrillation ([place])   . Thrombocytopenia ([place])    Past Surgical History:  Procedure Laterality Date  . APPENDECTOMY  1946  . CATARACT EXTRACTION Right 2012  . COLONOSCOPY WITH PROPOFOL Left 03/27/2017   Procedure: COLONOSCOPY WITH PROPOFOL;  Surgeon: Ronnette Juniper, MD;  Location: [facility];  Service: Gastroenterology;  Laterality: Left;  . CORONARY ANGIOPLASTY WITH STENT PLACEMENT  2009   "put 2 stents in"  . HERNIA REPAIR    . IR LUMBAR [place] W/IMG GUIDE  10/08/2018  . JOINT REPLACEMENT    . TEE WITH CARDIOVERSION     Postoperative in 2009;  S/P transesophageal echocardiography-guided cardioversion,  . TOTAL KNEE ARTHROPLASTY Right 1994  . VENTRAL HERNIA REPAIR  10/1999   With mesh    FAMILY HISTORY Family History  Problem Relation Age of Onset  . Clotting disorder Mother        Cerebral hemorrhage  . Emphysema Father        COD  . Lung cancer Brother   . Heart disease Neg Hx     SOCIAL HISTORY Social History   Tobacco Use  . Smoking status: Never Smoker  . Smokeless tobacco: Never Used  Vaping Use  . Vaping Use: Never used  Substance Use Topics  . Alcohol use: No  . Drug use: No         OPHTHALMIC EXAM:  Base Eye  Exam    Visual Acuity (ETDRS)      Right Left   Dist cc 20/40 20/25 -3   Dist ph cc 20/30 -3    Correction: Glasses       Tonometry    Tonopen, 8:44 AM       Pupils      Pupils Dark Light Shape React APD   Right PERRL 3 2 Round Brisk None   Left PERRL 3 2 Round Brisk None  Visual Fields (Counting fingers)      Left Right    Full Full       Extraocular Movement      Right Left    Full Full       Neuro/Psych    Oriented x3: Yes   Mood/Affect: Normal       Dilation    Both eyes: 1.0% Mydriacyl, 2.5% Phenylephrine @ 8:46 AM        Slit Lamp and Fundus Exam    External Exam      Right Left   External Normal Normal       Slit Lamp Exam      Right Left   Lids/Lashes Normal Normal   Conjunctiva/Sclera White and quiet White and quiet   Cornea Clear Clear   Anterior Chamber Deep and quiet Deep and quiet   Iris Round and reactive Round and reactive   Lens Posterior chamber intraocular lens Posterior chamber intraocular lens   Anterior Vitreous Normal Normal       Fundus Exam      Right Left   Periphery Normal           IMAGING AND PROCEDURES  Imaging and Procedures for 10/10/20  OCT, Retina - OU - Both Eyes       Right Eye Quality was good. Scan locations included subfoveal. Central Foveal Thickness: 229. Findings include abnormal foveal contour, retinal drusen .   Left Eye Quality was good. Scan locations included subfoveal. Central Foveal Thickness: 275. Progression has improved. Findings include vitreous traction, vitreomacular adhesion , retinal drusen , abnormal foveal contour, no IRF, no SRF.   Notes Region inferior to the fovea OD, much less intraretinal fluid, currently on 6-week follow-up interval of intravitreal Avastin for CNVM, will continue with therapy today  And again examination in 6 weeks to maintain excellent acuity  Rest with vitreal macular traction syndrome, continuing to slowly release with much less inner foveal  distortion today yet small area of traction remains       Intravitreal Injection, Pharmacologic Agent - OD - Right Eye       Time Out 10/10/2020. 9:36 AM. Confirmed correct patient, procedure, site, and patient consented.   Anesthesia Topical anesthesia was used. Anesthetic medications included [place] 3.5%.   Procedure Preparation included 10% betadine to eyelids, Ofloxacin . A 30 gauge needle was used.   Injection:  1.25 mg Bevacizumab (AVASTIN) SOLN   NDC: 70360-001-02   Route: Intravitreal, Site: Right Eye, Waste: 0 mg  Post-op Post injection exam found visual acuity of at least counting fingers. The patient tolerated the procedure well. There were no complications. The patient received written and verbal post procedure care education. Post injection medications were not given.                 ASSESSMENT/PLAN:  Vitreomacular adhesion of left eye Continuous improvement in the left eye continues with release of the vitreal macular traction.  Exudative age-related macular degeneration of right eye with active choroidal neovascularization (HCC) Pete injection intravitreal Avastin OD today at 6-week interval.      ICD-10-CM   1. Exudative age-related macular degeneration of right eye with active choroidal neovascularization (HCC)  H35.3211 OCT, Retina - OU - Both Eyes    Intravitreal Injection, Pharmacologic Agent - OD - Right Eye    Bevacizumab (AVASTIN) SOLN 1.25 mg  2. Vitreomacular adhesion of left eye  H43.822     1.  Much less active CNVM OD status post  intravitreal Avastin currently at 6-week interval.  We will repeat today and again evaluation in 6-week  2.  Incidental, vitreal macular traction adhesion of the foveal region has continued to slowly release and only minor intraretinal distortion, no visual acuity impact, will continue to observe  3.  Ophthalmic Meds Ordered this visit:  Meds ordered this encounter  Medications  . Bevacizumab (AVASTIN) SOLN  1.25 mg       Return in about 6 weeks (around 11/21/2020) for dilate, OD, AVASTIN OCT.  There are no Patient Instructions on file for this visit.   Explained the diagnoses, plan, and follow up with the patient and they expressed understanding.  Patient expressed understanding of the importance of proper follow up care.   Clent Demark Kaleiah Kutzer M.D. Diseases & Surgery of the Retina and Vitreous [facility] [place] 10/10/20     Abbreviations: M myopia (nearsighted); A astigmatism; H hyperopia (farsighted); P presbyopia; Mrx spectacle prescription;  CTL contact lenses; OD right eye; OS left eye; OU both eyes  XT exotropia; ET esotropia; PEK punctate epithelial keratitis; PEE punctate epithelial erosions; DES dry eye syndrome; MGD meibomian gland dysfunction; ATs artificial tears; PFAT's preservative free artificial tears; [place] nuclear sclerotic cataract; PSC posterior subcapsular cataract; ERM epi-retinal membrane; PVD posterior vitreous detachment; RD retinal detachment; DM diabetes mellitus; DR diabetic retinopathy; NPDR non-proliferative diabetic retinopathy; PDR proliferative diabetic retinopathy; CSME clinically significant macular edema; DME diabetic macular edema; dbh dot blot hemorrhages; CWS cotton wool spot; POAG primary open angle glaucoma; C/D cup-to-disc ratio; HVF humphrey visual field; GVF goldmann visual field; OCT optical coherence tomography; IOP intraocular pressure; BRVO Branch retinal vein occlusion; CRVO central retinal vein occlusion; CRAO central retinal artery occlusion; BRAO branch retinal artery occlusion; RT retinal tear; SB scleral buckle; PPV pars plana vitrectomy; VH Vitreous hemorrhage; PRP panretinal laser photocoagulation; IVK intravitreal kenalog; VMT vitreomacular traction; MH Macular hole;  NVD neovascularization of the disc; NVE neovascularization elsewhere; AREDS age related eye disease study; ARMD age related macular degeneration; POAG primary open angle  glaucoma; EBMD epithelial/anterior basement membrane dystrophy; ACIOL anterior chamber intraocular lens; IOL intraocular lens; PCIOL posterior chamber intraocular lens; Phaco/IOL phacoemulsification with intraocular lens placement; [place] photorefractive keratectomy; LASIK laser assisted in situ keratomileusis; HTN hypertension; DM diabetes mellitus; COPD chronic obstructive pulmonary disease

## 2020-10-10 NOTE — Assessment & Plan Note (Signed)
Continuous improvement in the left eye continues with release of the vitreal macular traction.

## 2020-10-10 NOTE — Telephone Encounter (Signed)
Patient has upcoming appointment with me on 12/3. We will discuss her Duloxetine at that time.

## 2020-10-10 NOTE — Assessment & Plan Note (Signed)
Pete injection intravitreal Avastin OD today at 6-week interval.

## 2020-10-13 NOTE — Progress Notes (Signed)
    SUBJECTIVE:   CHIEF COMPLAINT / HPI:   Medication Concerns Patient received a 3 page letter in the mail from her new pharmacy stating the risks of Duloxetine. She is concerned about this medication and whether it is safe for her to take.  Burning in Hands and Feet Reports burning in bilateral hand and feet. This has been ongoing for months to years. She takes Duloxetine 20mg  daily which she thinks helps slightly. Patient tried gabapentin in the past (twice) but discontinued due to feeling unsteady on her feet.    PERTINENT  PMH / PSH: Hypertension, CKD stage 3, HFrEF (45%), a-fib, CAD, T2DM, prior CVA  OBJECTIVE:   BP 120/78   Pulse 82   Wt 217 lb (98.4 kg)   LMP  (LMP Unknown)   SpO2 99%   BMI 32.99 kg/m   Gen: alert, NAD CV: irregularly irregular, normal S1/S2 without m/r/g Ext: gross sensation intact in bilateral hands and feet. No skin changes. Full ROM and 5/5 strength in b/l hands. Nontender 2-3cm mucinous cyst on palmar aspect of left 1st MCP   ASSESSMENT/PLAN:   DM type 2 with diabetic peripheral neuropathy (HCC) Well-controlled, at goal.  A1C 6.7% today. Patient not currently on any diabetes medications at home. Discontinued Lantus in July 2021 due to A1c of 6.5%. Trialed Ozempic at that time but did not tolerate due to GI effects. Today we discussed addition of SGLT2 inhibitor given patient's comorbidities (CHF, CKD). However, patient is sensitive to medications in general and already has polypharmacy. She would like to hold off which is very reasonable given her age and already good glycemic control.   For her peripheral neuropathy, patient declines trial of capsaicin cream. Plan to continue low dose Duloxetine.  Will recheck BMP at next visit   Alcus Dad, MD [place]

## 2020-10-14 ENCOUNTER — Ambulatory Visit (INDEPENDENT_AMBULATORY_CARE_PROVIDER_SITE_OTHER): Payer: Medicare HMO | Admitting: Family Medicine

## 2020-10-14 ENCOUNTER — Encounter: Payer: Self-pay | Admitting: Family Medicine

## 2020-10-14 ENCOUNTER — Other Ambulatory Visit: Payer: Self-pay

## 2020-10-14 VITALS — BP 120/78 | HR 82 | Wt 217.0 lb

## 2020-10-14 DIAGNOSIS — E1142 Type 2 diabetes mellitus with diabetic polyneuropathy: Secondary | ICD-10-CM

## 2020-10-14 LAB — POCT GLYCOSYLATED HEMOGLOBIN (HGB A1C): Hemoglobin A1C: 6.7 % — AB (ref 4.0–5.6)

## 2020-10-14 NOTE — Patient Instructions (Signed)
It was great to see you!  Our plans for today:  - Continue taking all your medications as prescribed. - The duloxetine is safe to continue. You are taking a very low dose.   Take care and seek immediate care sooner if you develop any concerns.   Dr. [facility]

## 2020-10-15 NOTE — Assessment & Plan Note (Addendum)
Well-controlled, at goal.  A1C 6.7% today. Patient not currently on any diabetes medications at home. Discontinued Lantus in July 2021 due to A1c of 6.5%. Trialed Ozempic at that time but did not tolerate due to GI effects. Today we discussed addition of SGLT2 inhibitor given patient's comorbidities (CHF, CKD). However, patient is sensitive to medications in general and already has polypharmacy. She would like to hold off which is very reasonable given her age and already good glycemic control.   For her peripheral neuropathy, patient declines trial of capsaicin cream. Plan to continue low dose Duloxetine.  Will recheck BMP at next visit

## 2020-10-18 DIAGNOSIS — E785 Hyperlipidemia, unspecified: Secondary | ICD-10-CM | POA: Diagnosis not present

## 2020-10-18 DIAGNOSIS — D649 Anemia, unspecified: Secondary | ICD-10-CM | POA: Diagnosis not present

## 2020-10-18 DIAGNOSIS — I2510 Atherosclerotic heart disease of native coronary artery without angina pectoris: Secondary | ICD-10-CM | POA: Diagnosis not present

## 2020-10-18 DIAGNOSIS — G2581 Restless legs syndrome: Secondary | ICD-10-CM | POA: Diagnosis not present

## 2020-10-18 DIAGNOSIS — I4891 Unspecified atrial fibrillation: Secondary | ICD-10-CM | POA: Diagnosis not present

## 2020-10-18 DIAGNOSIS — E669 Obesity, unspecified: Secondary | ICD-10-CM | POA: Diagnosis not present

## 2020-10-18 DIAGNOSIS — G8929 Other chronic pain: Secondary | ICD-10-CM | POA: Diagnosis not present

## 2020-10-18 DIAGNOSIS — E1142 Type 2 diabetes mellitus with diabetic polyneuropathy: Secondary | ICD-10-CM | POA: Diagnosis not present

## 2020-10-18 DIAGNOSIS — D6869 Other thrombophilia: Secondary | ICD-10-CM | POA: Diagnosis not present

## 2020-10-18 DIAGNOSIS — I951 Orthostatic hypotension: Secondary | ICD-10-CM | POA: Diagnosis not present

## 2020-11-21 ENCOUNTER — Other Ambulatory Visit: Payer: Self-pay

## 2020-11-21 ENCOUNTER — Ambulatory Visit (INDEPENDENT_AMBULATORY_CARE_PROVIDER_SITE_OTHER): Payer: Medicare HMO | Admitting: Ophthalmology

## 2020-11-21 ENCOUNTER — Encounter (INDEPENDENT_AMBULATORY_CARE_PROVIDER_SITE_OTHER): Payer: Self-pay | Admitting: Ophthalmology

## 2020-11-21 DIAGNOSIS — H43822 Vitreomacular adhesion, left eye: Secondary | ICD-10-CM

## 2020-11-21 DIAGNOSIS — E113491 Type 2 diabetes mellitus with severe nonproliferative diabetic retinopathy without macular edema, right eye: Secondary | ICD-10-CM | POA: Diagnosis not present

## 2020-11-21 DIAGNOSIS — H353211 Exudative age-related macular degeneration, right eye, with active choroidal neovascularization: Secondary | ICD-10-CM | POA: Diagnosis not present

## 2020-11-21 DIAGNOSIS — H33102 Unspecified retinoschisis, left eye: Secondary | ICD-10-CM | POA: Diagnosis not present

## 2020-11-21 MED ORDER — BEVACIZUMAB 2.5 MG/0.1ML IZ SOSY
2.5000 mg | PREFILLED_SYRINGE | INTRAVITREAL | Status: AC | PRN
  Administered 2020-11-21: 2.5 mg via INTRAVITREAL

## 2020-11-21 NOTE — Assessment & Plan Note (Signed)
Secondary to vitreomacular traction stable

## 2020-11-21 NOTE — Assessment & Plan Note (Signed)
The nature of severe nonproliferative diabetic retinopathy discussed with the patient as well as the need for more frequent follow up and likely progression to proliferative disease in the near future. The options of continued observation versus panretinal photocoagulation at this time were reviewed as well as the risks, benefits, and alternatives. More recent option includes the use of ocular injectable medications to slow progression of retinal disease. Tight control of glucose, blood pressure, and serum lipid levels were recommended under the direction of general physician or endocrinologist, as well as avoidance of smoking and maintenance of normal body weight. The 2-year risk of progression to proliferative diabetic retinopathy is 60%.

## 2020-11-21 NOTE — Progress Notes (Signed)
11/21/2020     CHIEF COMPLAINT Patient presents for Retina Follow Up (59 WK FU OU, POSS AVASTIN OD////Pt reports stable vision OU, no new F/F OU, no pain or pressure OU. ////Last A1C:  6.5 taken 09/2020//Last BS: 140 this AM )   HISTORY OF PRESENT ILLNESS: [name] is a 80 y.o. female who presents to the clinic today for:   HPI    Retina Follow Up    Patient presents with  Wet AMD.  In both eyes.  This started 6 weeks ago.  Duration of 6 weeks. Additional comments: 6 WK FU OU, POSS AVASTIN OD    Pt reports stable vision OU, no new F/F OU, no pain or pressure OU.     Last A1C:  6.5 taken 09/2020  Last BS: 140 this AM        Last edited by Nichola Sizer D on 11/21/2020  8:25 AM. (History)      Referring physician: Alcus Dad, [place],  [place] [id number]  HISTORICAL INFORMATION:   Selected notes from the MEDICAL RECORD NUMBER    Lab Results  Component Value Date   HGBA1C 6.7 (A) 10/14/2020     CURRENT MEDICATIONS: No current outpatient medications on file. (Ophthalmic Drugs)   No current facility-administered medications for this visit. (Ophthalmic Drugs)   Current Outpatient Medications (Other)  Medication Sig  . acetaminophen (TYLENOL 8 HOUR) 650 MG CR tablet Take 1 tablet (650 mg total) by mouth every 8 (eight) hours as needed for pain.  Marland Kitchen apixaban (ELIQUIS) 5 MG TABS tablet Take 1 tablet (5 mg total) by mouth 2 (two) times daily.  Marland Kitchen atorvastatin (LIPITOR) 40 MG tablet TAKE 1 TABLET BY MOUTH  DAILY AT 6 PM.  . Blood Glucose Monitoring Suppl (ONE TOUCH ULTRA 2) w/Device KIT Please use to check blood sugar up to three times daily.  . carvedilol (COREG) 6.25 MG tablet TAKE 1 TABLET BY MOUTH  TWICE DAILY WITH A MEAL  . cholecalciferol (VITAMIN D) 25 MCG (1000 UNIT) tablet Take 1 tablet (1,000 Units total) by mouth daily.  . DULoxetine (CYMBALTA) 20 MG capsule Take 1 capsule (20 mg total) by mouth daily.  . ferrous sulfate 325 (65 FE)  MG tablet TAKE 1 TABLET BY MOUTH EVERY DAY WITH BREAKFAST  . glucose blood (ONE TOUCH ULTRA TEST) test strip Use three times a day  . Lancets (ONETOUCH DELICA PLUS [id number]) MISC 1 application by Does not apply route daily. Please use to check blood sugar up to three times daily.  . nitroGLYCERIN (NITROSTAT) 0.4 MG SL tablet Place 1 tablet (0.4 mg total) under the tongue every 5 (five) minutes as needed for chest pain (up to 3 doses).  . potassium chloride SA (KLOR-CON) 20 MEQ tablet Take 3 tablets in the AM and 2 tablets in the PM  . rOPINIRole (REQUIP) 0.5 MG tablet TAKE 2 TABLETS BY MOUTH AT  BEDTIME  . torsemide (DEMADEX) 20 MG tablet TAKE 3 TABLETS BY MOUTH EVERY MORNING AND 2 TABLETS EVERY EVENING  . vitamin B-12 (CYANOCOBALAMIN) 1000 MCG tablet Take 1 tablet (1,000 mcg total) by mouth daily.   No current facility-administered medications for this visit. (Other)      REVIEW OF SYSTEMS:    ALLERGIES Allergies  Allergen Reactions  . Benazepril Other (See Comments)    Unknown reaction at age 78-65 - possibly dizziness  . Ozempic (0.25 Or 0.5 Mg-Dose) [Semaglutide(0.25 Or 0.5mg -Dos)] Diarrhea    Gi  intollerance  . Angiotensin Receptor Blockers Other (See Comments)    Hypotension reaction  . Fe-Succ-C-Thre-B12-Des Stomach Other (See Comments)    Unknown reaction  . Sulfamethoxazole-Trimethoprim Other (See Comments)    Unknown reaction    PAST MEDICAL HISTORY Past Medical History:  Diagnosis Date  . ACC/AHA stage C congestive heart failure due to ischemic cardiomyopathy ([place]) 01/31/2009   Qualifier: Diagnosis of  By: Olevia Perches, MD, Glenetta Hew   . ANXIETY 01/31/2009   Qualifier: Diagnosis of  By: Lovette Cliche, CNA, Christy    . Arthritis    "qwhere" (03/30/2016)  . Benign essential HTN   . CAD (coronary artery disease) 05/2008   a. s/p CABG in 2009.  Marland Kitchen Cerebrovascular accident (CVA) due to embolism of right middle cerebral artery ([place])   . Chronic anticoagulation   . Chronic  kidney disease (CKD), stage III (moderate) (HCC)   . Chronic lower back pain   . Chronic pain syndrome   . Chronic systolic CHF (congestive heart failure) ([place])   . DM type 2 with diabetic peripheral neuropathy ([place])   . Facial weakness, post-stroke   . Gastrointestinal hemorrhage 03/26/2017  . Hemiparesis ([place])   . History of CVA with residual deficit 03/26/2017  . Hyperlipidemia   . Hypertension   . Ischemic cardiomyopathy   . Longstanding persistent atrial fibrillation ([place])    a. Postoperative in 2009;  S/P transesophageal echocardiography-guided cardioversion, previously on Amiodarone and Coumadin. b. recurrent in April 2013 -> pt elected rate control strategy, initially Coumadin -> had stroke with subtherapeutic INR in 03/2016 and transitioned to Eliquis.  . Morbid obesity ([place]) 03/26/2017  . OSA on CPAP    severe OSA with AHI 34/hr  . Persistent atrial fibrillation ([place])   . Thrombocytopenia ([place])    Past Surgical History:  Procedure Laterality Date  . APPENDECTOMY  1946  . CATARACT EXTRACTION Right 2012  . COLONOSCOPY WITH PROPOFOL Left 03/27/2017   Procedure: COLONOSCOPY WITH PROPOFOL;  Surgeon: Ronnette Juniper, MD;  Location: [facility];  Service: Gastroenterology;  Laterality: Left;  . CORONARY ANGIOPLASTY WITH STENT PLACEMENT  2009   "put 2 stents in"  . HERNIA REPAIR    . IR LUMBAR [place] W/IMG GUIDE  10/08/2018  . JOINT REPLACEMENT    . TEE WITH CARDIOVERSION     Postoperative in 2009;  S/P transesophageal echocardiography-guided cardioversion,  . TOTAL KNEE ARTHROPLASTY Right 1994  . VENTRAL HERNIA REPAIR  10/1999   With mesh    FAMILY HISTORY Family History  Problem Relation Age of Onset  . Clotting disorder Mother        Cerebral hemorrhage  . Emphysema Father        COD  . Lung cancer Brother   . Heart disease Neg Hx     SOCIAL HISTORY Social History   Tobacco Use  . Smoking status: Never Smoker  . Smokeless tobacco: Never Used  Vaping Use   . Vaping Use: Never used  Substance Use Topics  . Alcohol use: No  . Drug use: No         OPHTHALMIC EXAM:  Base Eye Exam    Visual Acuity (ETDRS)      Right Left   Dist cc 20/40 +1 20/25 -2   Dist ph cc NI    Correction: Glasses       Tonometry (Tonopen, 8:34 AM)      Right Left   Pressure 13 15       Pupils  Dark Light Shape React APD   Right 3 2 Round Brisk None   Left 3 2 Round Brisk None       Visual Fields (Counting fingers)      Left Right    Full Full       Extraocular Movement      Right Left    Full Full       Neuro/Psych    Oriented x3: Yes   Mood/Affect: Normal       Dilation    Right eye: 1.0% Mydriacyl, 2.5% Phenylephrine @ 8:34 AM        Slit Lamp and Fundus Exam    External Exam      Right Left   External Normal Normal       Slit Lamp Exam      Right Left   Lids/Lashes Normal Normal   Conjunctiva/Sclera White and quiet White and quiet   Cornea Clear Clear   Anterior Chamber Deep and quiet Deep and quiet   Iris Round and reactive Round and reactive   Lens Posterior chamber intraocular lens Posterior chamber intraocular lens   Anterior Vitreous Normal Normal       Fundus Exam      Right Left   Posterior Vitreous Posterior vitreous detachment    Disc Normal    C/D Ratio 0.5    Macula Soft drusen, Retinal pigment epithelial mottling, no macular thickening    Vessels NPDR-Severe    Periphery Normal           IMAGING AND PROCEDURES  Imaging and Procedures for 11/21/20  OCT, Retina - OU - Both Eyes       Right Eye Quality was good. Scan locations included subfoveal. Central Foveal Thickness: 228. Progression has improved. Findings include abnormal foveal contour.   Left Eye Quality was good. Scan locations included subfoveal. Central Foveal Thickness: 286. Progression has been stable. Findings include abnormal foveal contour, vitreous traction, vitreomacular adhesion .   Notes OD with less subretinal fluid  inferior to the fovea from wet ARMD on OCT.  At 6-week interval.  We will repeat injection Avastin OD today  OS with persistent vitreal foveal traction of the inner retina With  elevation and secondary inner retinal foveomacular schisis yet with good acuity we will continue to monitor and observe       Intravitreal Injection, Pharmacologic Agent - OD - Right Eye       Time Out 11/21/2020. 9:17 AM. Confirmed correct patient, procedure, site, and patient consented.   Anesthesia Topical anesthesia was used. Anesthetic medications included Akten 3.5%.   Procedure Preparation included 10% betadine to eyelids, Ofloxacin . A 30 gauge needle was used.   Injection:  2.5 mg Bevacizumab (AVASTIN) 2.5mg /0.53mL SOSY   NDC: 24268-341-96, Lot: [id number]   Route: Intravitreal, Site: Right Eye  Post-op Post injection exam found visual acuity of at least counting fingers. The patient tolerated the procedure well. There were no complications. The patient received written and verbal post procedure care education. Post injection medications were not given.                 ASSESSMENT/PLAN:  Vitreomacular adhesion of left eye Stable OS overall and no visual acuity impact to date we will continue to monitor and observe  Severe nonproliferative diabetic retinopathy of right eye ([place]) The nature of severe nonproliferative diabetic retinopathy discussed with the patient as well as the need for more frequent follow up and likely progression to proliferative disease  in the near future. The options of continued observation versus panretinal photocoagulation at this time were reviewed as well as the risks, benefits, and alternatives. More recent option includes the use of ocular injectable medications to slow progression of retinal disease. Tight control of glucose, blood pressure, and serum lipid levels were recommended under the direction of general physician or endocrinologist, as well as avoidance of  smoking and maintenance of normal body weight. The 2-year risk of progression to proliferative diabetic retinopathy is 60%.  Exudative age-related macular degeneration of right eye with active choroidal neovascularization ([place]) Improved OD today at 6-week follow up.  And will Pete intravitreal Avastin therapy today as less CME secondary to CNVM inferiorly  Left retinoschisis Secondary to vitreomacular traction stable      ICD-10-CM   1. Exudative age-related macular degeneration of right eye with active choroidal neovascularization (HCC)  H35.3211 OCT, Retina - OU - Both Eyes    Intravitreal Injection, Pharmacologic Agent - OD - Right Eye    bevacizumab (AVASTIN) SOSY 2.5 mg  2. Vitreomacular adhesion of left eye  H43.822   3. Severe nonproliferative diabetic retinopathy of right eye without macular edema associated with type 2 diabetes mellitus ([place])  E11.3491   4. Left retinoschisis  H33.102     1.  2.  3.  Ophthalmic Meds Ordered this visit:  Meds ordered this encounter  Medications  . bevacizumab (AVASTIN) SOSY 2.5 mg       Return in about 6 weeks (around 01/02/2021) for DILATE OU, AVASTIN OCT, OD.  There are no Patient Instructions on file for this visit.   Explained the diagnoses, plan, and follow up with the patient and they expressed understanding.  Patient expressed understanding of the importance of proper follow up care.   Clent Demark Marcas Bowsher M.D. Diseases & Surgery of the Retina and Vitreous [facility] 11/21/20     Abbreviations: M myopia (nearsighted); A astigmatism; H hyperopia (farsighted); P presbyopia; Mrx spectacle prescription;  CTL contact lenses; OD right eye; OS left eye; OU both eyes  XT exotropia; ET esotropia; PEK punctate epithelial keratitis; PEE punctate epithelial erosions; DES dry eye syndrome; MGD meibomian gland dysfunction; ATs artificial tears; PFAT's preservative free artificial tears; [place] nuclear sclerotic cataract; PSC  posterior subcapsular cataract; ERM epi-retinal membrane; PVD posterior vitreous detachment; RD retinal detachment; DM diabetes mellitus; DR diabetic retinopathy; NPDR non-proliferative diabetic retinopathy; PDR proliferative diabetic retinopathy; CSME clinically significant macular edema; DME diabetic macular edema; dbh dot blot hemorrhages; CWS cotton wool spot; POAG primary open angle glaucoma; C/D cup-to-disc ratio; HVF humphrey visual field; GVF goldmann visual field; OCT optical coherence tomography; IOP intraocular pressure; BRVO Branch retinal vein occlusion; CRVO central retinal vein occlusion; CRAO central retinal artery occlusion; BRAO branch retinal artery occlusion; RT retinal tear; SB scleral buckle; PPV pars plana vitrectomy; VH Vitreous hemorrhage; PRP panretinal laser photocoagulation; IVK intravitreal kenalog; VMT vitreomacular traction; MH Macular hole;  NVD neovascularization of the disc; NVE neovascularization elsewhere; AREDS age related eye disease study; ARMD age related macular degeneration; POAG primary open angle glaucoma; EBMD epithelial/anterior basement membrane dystrophy; ACIOL anterior chamber intraocular lens; IOL intraocular lens; PCIOL posterior chamber intraocular lens; Phaco/IOL phacoemulsification with intraocular lens placement; [place] photorefractive keratectomy; LASIK laser assisted in situ keratomileusis; HTN hypertension; DM diabetes mellitus; COPD chronic obstructive pulmonary disease

## 2020-11-21 NOTE — Assessment & Plan Note (Signed)
Stable OS overall and no visual acuity impact to date we will continue to monitor and observe

## 2020-11-21 NOTE — Assessment & Plan Note (Addendum)
Improved OD today at 6-week follow up.  And will Pete intravitreal Avastin therapy today as less CME secondary to CNVM inferiorly

## 2020-11-25 ENCOUNTER — Telehealth: Payer: Self-pay

## 2020-11-25 ENCOUNTER — Ambulatory Visit (HOSPITAL_COMMUNITY)
Admission: RE | Admit: 2020-11-25 | Discharge: 2020-11-25 | Disposition: A | Discharge status: Home / Self Care | Payer: Medicare HMO | Source: Ambulatory Visit | Attending: Cardiology | Admitting: Cardiology

## 2020-11-25 ENCOUNTER — Telehealth (HOSPITAL_COMMUNITY): Payer: Self-pay | Admitting: *Deleted

## 2020-11-25 ENCOUNTER — Other Ambulatory Visit: Payer: Self-pay

## 2020-11-25 ENCOUNTER — Encounter (HOSPITAL_COMMUNITY): Payer: Self-pay | Admitting: Cardiology

## 2020-11-25 VITALS — BP 140/80 | HR 68 | Wt 220.0 lb

## 2020-11-25 DIAGNOSIS — Z7901 Long term (current) use of anticoagulants: Secondary | ICD-10-CM | POA: Insufficient documentation

## 2020-11-25 DIAGNOSIS — Z951 Presence of aortocoronary bypass graft: Secondary | ICD-10-CM | POA: Insufficient documentation

## 2020-11-25 DIAGNOSIS — I272 Pulmonary hypertension, unspecified: Secondary | ICD-10-CM | POA: Insufficient documentation

## 2020-11-25 DIAGNOSIS — I255 Ischemic cardiomyopathy: Secondary | ICD-10-CM | POA: Diagnosis not present

## 2020-11-25 DIAGNOSIS — Z8673 Personal history of transient ischemic attack (TIA), and cerebral infarction without residual deficits: Secondary | ICD-10-CM | POA: Diagnosis not present

## 2020-11-25 DIAGNOSIS — I130 Hypertensive heart and chronic kidney disease with heart failure and stage 1 through stage 4 chronic kidney disease, or unspecified chronic kidney disease: Secondary | ICD-10-CM | POA: Diagnosis not present

## 2020-11-25 DIAGNOSIS — Z825 Family history of asthma and other chronic lower respiratory diseases: Secondary | ICD-10-CM | POA: Diagnosis not present

## 2020-11-25 DIAGNOSIS — I4819 Other persistent atrial fibrillation: Secondary | ICD-10-CM

## 2020-11-25 DIAGNOSIS — N183 Chronic kidney disease, stage 3 unspecified: Secondary | ICD-10-CM | POA: Diagnosis not present

## 2020-11-25 DIAGNOSIS — Z79899 Other long term (current) drug therapy: Secondary | ICD-10-CM | POA: Insufficient documentation

## 2020-11-25 DIAGNOSIS — Z801 Family history of malignant neoplasm of trachea, bronchus and lung: Secondary | ICD-10-CM | POA: Insufficient documentation

## 2020-11-25 DIAGNOSIS — E1122 Type 2 diabetes mellitus with diabetic chronic kidney disease: Secondary | ICD-10-CM | POA: Insufficient documentation

## 2020-11-25 DIAGNOSIS — I482 Chronic atrial fibrillation, unspecified: Secondary | ICD-10-CM | POA: Insufficient documentation

## 2020-11-25 DIAGNOSIS — Z7984 Long term (current) use of oral hypoglycemic drugs: Secondary | ICD-10-CM | POA: Insufficient documentation

## 2020-11-25 DIAGNOSIS — G4733 Obstructive sleep apnea (adult) (pediatric): Secondary | ICD-10-CM | POA: Diagnosis not present

## 2020-11-25 DIAGNOSIS — N1831 Chronic kidney disease, stage 3a: Secondary | ICD-10-CM

## 2020-11-25 DIAGNOSIS — Z881 Allergy status to other antibiotic agents status: Secondary | ICD-10-CM | POA: Insufficient documentation

## 2020-11-25 DIAGNOSIS — Z832 Family history of diseases of the blood and blood-forming organs and certain disorders involving the immune mechanism: Secondary | ICD-10-CM | POA: Insufficient documentation

## 2020-11-25 DIAGNOSIS — I2510 Atherosclerotic heart disease of native coronary artery without angina pectoris: Secondary | ICD-10-CM | POA: Insufficient documentation

## 2020-11-25 DIAGNOSIS — I5022 Chronic systolic (congestive) heart failure: Secondary | ICD-10-CM | POA: Diagnosis not present

## 2020-11-25 DIAGNOSIS — E1140 Type 2 diabetes mellitus with diabetic neuropathy, unspecified: Secondary | ICD-10-CM | POA: Insufficient documentation

## 2020-11-25 LAB — CBC
HCT: 35.7 % — ABNORMAL LOW (ref 36.0–46.0)
Hemoglobin: 12 g/dL (ref 12.0–15.0)
MCH: 32.9 pg (ref 26.0–34.0)
MCHC: 33.6 g/dL (ref 30.0–36.0)
MCV: 97.8 fL (ref 80.0–100.0)
Platelets: 136 10*3/uL — ABNORMAL LOW (ref 150–400)
RBC: 3.65 MIL/uL — ABNORMAL LOW (ref 3.87–5.11)
RDW: 13.8 % (ref 11.5–15.5)
WBC: 4.9 10*3/uL (ref 4.0–10.5)
nRBC: 0 % (ref 0.0–0.2)

## 2020-11-25 LAB — BASIC METABOLIC PANEL
Anion gap: 9 (ref 5–15)
BUN: 58 mg/dL — ABNORMAL HIGH (ref 8–23)
CO2: 28 mmol/L (ref 22–32)
Calcium: 10.4 mg/dL — ABNORMAL HIGH (ref 8.9–10.3)
Chloride: 102 mmol/L (ref 98–111)
Creatinine, Ser: 1.78 mg/dL — ABNORMAL HIGH (ref 0.44–1.00)
GFR, Estimated: 28 mL/min — ABNORMAL LOW (ref >60)
Glucose, Bld: 127 mg/dL — ABNORMAL HIGH (ref 70–99)
Potassium: 5.3 mmol/L — ABNORMAL HIGH (ref 3.5–5.1)
Sodium: 139 mmol/L (ref 135–145)

## 2020-11-25 LAB — BRAIN NATRIURETIC PEPTIDE: B Natriuretic Peptide: 115.5 pg/mL — ABNORMAL HIGH (ref 0.0–100.0)

## 2020-11-25 MED ORDER — TORSEMIDE 20 MG PO TABS: ORAL_TABLET | ORAL | 3 refills | Status: DC

## 2020-11-25 MED ORDER — POTASSIUM CHLORIDE CRYS ER 20 MEQ PO TBCR: 60.0000 meq | EXTENDED_RELEASE_TABLET | Freq: Every day | ORAL | 3 refills | Status: DC

## 2020-11-25 MED ORDER — EMPAGLIFLOZIN 10 MG PO TABS: 10.0000 mg | ORAL_TABLET | Freq: Every day | ORAL | 0 refills | Status: AC

## 2020-11-25 MED ORDER — EMPAGLIFLOZIN 10 MG PO TABS: 10.0000 mg | ORAL_TABLET | Freq: Every day | ORAL | 3 refills | Status: DC

## 2020-11-25 NOTE — Progress Notes (Signed)
Patient is being seen in the [facility]. VS, EKG, and device interrogations performed in the clinic. Dr McLean is at home and seeing patients via telemedicine as he is in quarantine.   

## 2020-11-25 NOTE — Patient Instructions (Addendum)
Labs done today. We will contact you only if your labs are abnormal.  START Jardiance 10mg  (1 tablet) by mouth daily.  WHEN YOU START JARDIANCE DECREASE Torsemide to 60mg (3 tablets) every morning and 40mg (2 tablets) every evening.  No other medication changes were made. Please continue all current medications as prescribed.  Your physician recommends that you schedule a follow-up appointment in: 10 days for a lab only appointment and in 3 months for echo and appointment with Dr. Aundra Dubin  Your physician has requested that you have an echocardiogram. Echocardiography is a painless test that uses sound waves to create images of your heart. It provides your doctor with information about the size and shape of your heart and how well your heart's chambers and valves are working. This procedure takes approximately one hour. There are no restrictions for this procedure.   If you have any questions or concerns before your next appointment please send [place] a message through [place] or call our office at [phone].    TO LEAVE A MESSAGE FOR THE NURSE SELECT OPTION 2, PLEASE LEAVE A MESSAGE INCLUDING: . YOUR NAME . DATE OF BIRTH . CALL BACK NUMBER . REASON FOR CALL**this is important as we prioritize the call backs  YOU WILL RECEIVE A CALL BACK THE SAME DAY AS LONG AS YOU CALL BEFORE 4:00 PM   Do the following things EVERYDAY: 1) Weigh yourself in the morning before breakfast. Write it down and keep it in a log. 2) Take your medicines as prescribed 3) Eat low salt foods--Limit salt (sodium) to 2000 mg per day.  4) Stay as active as you can everyday 5) Limit all fluids for the day to less than 2 liters   At the [facility], you and your health needs are our priority. As part of our continuing mission to provide you with exceptional heart care, we have created designated Provider Care Teams. These Care Teams include your primary Cardiologist (physician) and Advanced Practice  Providers (APPs- Physician Assistants and Nurse Practitioners) who all work together to provide you with the care you need, when you need it.   You may see any of the following providers on your designated Care Team at your next follow up: Marland Kitchen Dr Glori Bickers . Dr Loralie Champagne . Darrick Grinder, NP . Lyda Jester, PA . Audry Riles, PharmD   Please be sure to bring in all your medications bottles to every appointment.

## 2020-11-25 NOTE — Telephone Encounter (Signed)
Patient calls nurse line to follow up with PCP regarding cardiology visit. Patient states that cardiologist is recommending that she receive a neurologist referral to evaluate the tingling and pain that she is experiencing in her hands and feet.   Cardiologist also states that patient cannot go off of Eliquis.   Patient is requesting returned phone call from PCP to discuss further.   Talbot Grumbling, RN

## 2020-11-25 NOTE — Telephone Encounter (Signed)
-----   Message from Larey Dresser, MD sent at 11/25/2020  1:08 PM EST ----- Decrease KCl to 60 daily rather than 60 qam/40 qpm.  BMET 1 week.

## 2020-11-25 NOTE — Telephone Encounter (Signed)
Pt aware, agreeable, and verbalized understanding, med list updated, pt sch for labs 1/25

## 2020-11-26 NOTE — Progress Notes (Signed)
Heart Failure TeleHealth Note  Due to national recommendations of social distancing due to [place] 19, Audio/video telehealth visit is felt to be most appropriate for this patient at this time.  See MyChart message from today for patient consent regarding telehealth for [facility].  Date:  11/26/2020   ID:  [name], DOB 09/01/1941, MRN [id number]  Location: Home  Provider location: [place] [facility] Failure Type of Visit: Established patient   PCP:  Alcus Dad, MD  Cardiologist:  Fransico Him, MD Primary HF: Dr. Aundra Dubin   History of Present Illness: [name] is a 80 y.o. female who presents via audio/video conferencing for a telehealth visit today.     she denies symptoms worrisome for COVID 19.   Patient has a history of CAD status post CABG 2009, ischemic cardiomyopathy with chronic systolic CHF, persistent atrial fibrillation with stroke in 03/2016 when INR was subtherapeutic, stage III CKD, morbid obesity, pulmonary hypertension, diabetes, GI bleed 03/2017, and severe sleep apnea.   Seen in [facility] clinics 09/10/18 and 09/24/18, both times with volume overload and marked peripheral edema with skin breakdown. Torsemide increased at each visit. Echo in 7/19 showed EF 45-50% with mild RV dilation/mild RV systolic dysfunction and D-shaped interventricular septum.    AKI earlier in 7/20 in setting of excessive torsemide, she had increased dose to 60 qam/40 qpm on her own.  This was cut back to 40 mg daily and losartan and spironolactone were stopped.   Echo in 9/20 showed EF 45% with mildly decreased RV systolic function, PASP 46 mmHg with dilated IVC.  PYP scan in 10/21 was negative.   She returns today for followup of CHF.  Weight is down 12 lbs.  Main complaint is neuropathy pain in her hands and feet.  She walks with her walker without significant dyspnea.  She is short of breath walking up hills and inclines.  No chest pain.  No lightheadedness.  No  palpitations.  She is not using CPAP, says that her machine was "recalled."    Labs (12/18): K 3.9, Creatinine 1.65 Labs (5/19): K 4.1, creatinine 1.7 Labs (6/19): hgb 8.2 Labs (8/19): K 4.3, creatinine 1.73 Labs (9/19): K 4, creatinine 1.56 Labs (11/19): K 4.7, creatinine 2.04 Labs (12/19): creatinine 1.44 Labs (7/20): K 5, creatinine 2.49 => 1.86, Hgb 11.1 Labs (8/20): K 3.9, creatinine 1.79 Labs (9/20): LDL 50 Labs (11/20): K 3.8, creatinine 2.01 Labs (2/21): K 4, creatinine 1.9 Labs (7/21): K 4.2, creatinine 1.84, hgb 11.2 Labs (9/21): myeloma panel negative, urine immunofixation negative, LDL 50, K 4.6, creatinine 1.76  Review of systems complete and found to be negative unless listed in HPI.    Past Medical History 1. Chronic systolic CHF: Ischemic cardiomyopathy with prominent RV failure.   - Echo (11/18): LVEF 35-40%, severe RV dilation, severe RAE, severe TR.  - Echo (7/19): EF 45-50%, diffuse hypokinesis, mild RV dilation with mildly decreased RV systolic function, D-shaped interventricular septum suggestive of RV pressure/volume overload, mild MR, moderate TR, PASP 49 mmhg.  - Echo (9/20): EF 45%, mild LV dilation, mild RV dilation with mildly decreased systolic function, PASP 46 mmHg, IVC dilated.  - PYP scan (10/21): Negative.  2. CAD: CABG 2009. No angiography since that time.   3. HTN 4. Atrial fibrillation: Chronic since 2013.  5. CKD stage 3 6. Morbid obesity 7. OSA 8. Chronic venous stasis with RLE wound/[place] 9. CVA 5/18.   Current Outpatient Medications  Medication Sig  Dispense Refill  . acetaminophen (TYLENOL 8 HOUR) 650 MG CR tablet Take 1 tablet (650 mg total) by mouth every 8 (eight) hours as needed for pain. 30 tablet 0  . apixaban (ELIQUIS) 5 MG TABS tablet Take 1 tablet (5 mg total) by mouth 2 (two) times daily. 180 tablet 3  . atorvastatin (LIPITOR) 40 MG tablet TAKE 1 TABLET BY MOUTH  DAILY AT 6 PM. 90 tablet 3  . Blood Glucose Monitoring Suppl  (ONE TOUCH ULTRA 2) w/Device KIT Please use to check blood sugar up to three times daily. 1 kit 0  . carvedilol (COREG) 6.25 MG tablet TAKE 1 TABLET BY MOUTH  TWICE DAILY WITH A MEAL 180 tablet 3  . cholecalciferol (VITAMIN D) 25 MCG (1000 UNIT) tablet Take 1 tablet (1,000 Units total) by mouth daily. 90 tablet 1  . DULoxetine (CYMBALTA) 20 MG capsule Take 1 capsule (20 mg total) by mouth daily. 90 capsule 3  . empagliflozin (JARDIANCE) 10 MG TABS tablet Take 1 tablet (10 mg total) by mouth daily before breakfast. 90 tablet 3  . empagliflozin (JARDIANCE) 10 MG TABS tablet Take 1 tablet (10 mg total) by mouth daily before breakfast. 30 tablet 0  . ferrous sulfate 325 (65 FE) MG tablet TAKE 1 TABLET BY MOUTH EVERY DAY WITH BREAKFAST 90 tablet 1  . glucose blood (ONE TOUCH ULTRA TEST) test strip Use three times a day 100 each 3  . Lancets (ONETOUCH DELICA PLUS [id number]) MISC 1 application by Does not apply route daily. Please use to check blood sugar up to three times daily. 100 each 6  . nitroGLYCERIN (NITROSTAT) 0.4 MG SL tablet Place 1 tablet (0.4 mg total) under the tongue every 5 (five) minutes as needed for chest pain (up to 3 doses). 15 tablet 5  . rOPINIRole (REQUIP) 0.5 MG tablet TAKE 2 TABLETS BY MOUTH AT  BEDTIME 180 tablet 3  . vitamin B-12 (CYANOCOBALAMIN) 1000 MCG tablet Take 1 tablet (1,000 mcg total) by mouth daily. 90 tablet 1  . potassium chloride SA (KLOR-CON) 20 MEQ tablet Take 3 tablets (60 mEq total) by mouth daily. 450 tablet 3  . torsemide (DEMADEX) 20 MG tablet Take 3 tablets (60 mg total) by mouth every morning AND 2 tablets (40 mg total) every evening. 450 tablet 3   No current facility-administered medications for this encounter.   Allergies  Allergen Reactions  . Benazepril Other (See Comments)    Unknown reaction at age 78-65 - possibly dizziness  . Ozempic (0.25 Or 0.5 Mg-Dose) [Semaglutide(0.25 Or 0.5mg -Dos)] Diarrhea    Gi intollerance  . Angiotensin Receptor  Blockers Other (See Comments)    Hypotension reaction  . Fe-Succ-C-Thre-B12-Des Stomach Other (See Comments)    Unknown reaction  . Sulfamethoxazole-Trimethoprim Other (See Comments)    Unknown reaction   Social History   Socioeconomic History  . Marital status: Widowed    Spouse name: Not on file  . Number of children: Not on file  . Years of education: Not on file  . Highest education level: Not on file  Occupational History  . Not on file  Tobacco Use  . Smoking status: Never Smoker  . Smokeless tobacco: Never Used  Vaping Use  . Vaping Use: Never used  Substance and Sexual Activity  . Alcohol use: No  . Drug use: No  . Sexual activity: Not Currently  Other Topics Concern  . Not on file  Social History Narrative   Widowed   Lives alone in  [place]   2 children   Not routinely exercising   Social Determinants of Health   Financial Resource Strain: Not on file  Food Insecurity: Not on file  Transportation Needs: Not on file  Physical Activity: Not on file  Stress: Not on file  Social Connections: Not on file  Intimate Partner Violence: Not on file   Family History  Problem Relation Age of Onset  . Clotting disorder Mother        Cerebral hemorrhage  . Emphysema Father        COD  . Lung cancer Brother   . Heart disease Neg Hx    Vitals:   11/25/20 0922  BP: 140/80  Pulse: 68  SpO2: 97%  Weight: 99.8 kg (220 lb)     Wt Readings from Last 3 Encounters:  11/25/20 99.8 kg (220 lb)  10/14/20 98.4 kg (217 lb)  09/01/20 101.6 kg (224 lb)    Exam:  (Video/Tele Health Call; Exam is subjective and or/visual.) General:  Speaks in full sentences. No resp difficulty. Neck: No JVD.  Lungs: Normal respiratory effort with conversation.  Abdomen: Non-distended per patient report Extremities: Pt denies edema. Neuro: Alert & oriented x 3.   ASSESSMENT & PLAN:  1. Chronic systolic CHF with prominent RV failure: Ischemic cardiomyopathy.  Echo in 7/19 showed LV  EF 45-50% (improved) with mildly dilated/mildly dysfunction RV.  D-shaped septum suggested RV pressure/volume overload.  Echo in 9/20 showed EF 45%, mildly dysfunctional RV, PASP 46 mmHg with dilated IVC.  PYP scan negative and myeloma workup negative, no evidence for cardiac amyloidosis.  Weight is down 12 lbs.  I do not think that she is volume overloaded. - I will arrange for echo at followup in 3 months.  - Add Jardiance 10 mg daily. BMET today and in 10 days.  - Decrease torsemide to 60 qam/40 qpm.   - Continue Coreg 6.25 mg bid.   - She is not on ACEI/ARB/spironolactone with elevated creatinine.  - We have discussed Cardiomems but she has not been admitted in the last year so cannot get.  2. CAD s/p CABG: No chest pain.  - Continue atorvastatin 40 mg daily. Good lipids in 9/21.     - No ASA with stable CAD on Eliquis.  3. Atrial fibrillation: Chronic. Given long-term atrial fibrillation, she is unlikely to successfully cardiovert.  - Continue Eliquis.   4. CKD stage 3: BMET today.   5. H/o CVA: Continue Eliquis.   6. OSA: Will see if Dr Theodosia Blender office can help with her CPAP.  7. HTN: BP mildly elevated, follow for now.   COVID screen The patient does not have any symptoms that suggest any further testing/ screening at this time.  Social distancing reinforced today.  Patient Risk: After full review of this patients clinical status, I feel that they are at moderate risk for cardiac decompensation at this time.  Relevant cardiac medications were reviewed at length with the patient today. The patient does not have concerns regarding their medications at this time.   Recommended follow-up:  3 months with echo  Today, I have spent 17 minutes with the patient with telehealth technology discussing the above issues .    Signed, Loralie Champagne, MD  11/26/2020  [facility] [address] [facility] and [place] [address] [phone]  (office) [phone] (fax)

## 2020-11-28 NOTE — Telephone Encounter (Signed)
Returned patient's call. She has an upcoming appointment with me on 12/22/20, at which time we will discuss neurology referral. I agree that patient should remain on Eliquis. Answered all patient's questions.

## 2020-11-29 ENCOUNTER — Telehealth (HOSPITAL_COMMUNITY): Payer: Self-pay | Admitting: *Deleted

## 2020-11-29 NOTE — Telephone Encounter (Signed)
Pt left VM stating she was supposed to have labs 10days after starting new medication but the medication is mail ordered and she wont get it until 1/22. Pt needs to reschedule lab appt. I called pt no answer/left vm to return call to schedule her lab appt until 1/31.

## 2020-12-01 ENCOUNTER — Telehealth: Payer: Self-pay | Admitting: *Deleted

## 2020-12-01 NOTE — Telephone Encounter (Signed)
Patient understands she does not have a recalled unit (Respironics) but she does have a Resmed unit. Patient understands she needs to be compliant on her unit. Patient explains she has not been using her machine because she was told her machine was on the recall list. Message sent to dr Aundra Dubin and dr Radford Pax.

## 2020-12-01 NOTE — Telephone Encounter (Signed)
-----   Message from Sueanne Margarita, MD sent at 11/27/2020 10:58 AM EST ----- She has a Respironics which were recalled due to issues with carcinogen in the device. Right now they are telling patients to continue using them until there are due for another device per the 5 year rule.  Gae Bon  Can you check on this with her DME and call her to tell her what the DME suggests that she due  Traci ----- Message ----- From: Larey Dresser, MD Sent: 11/26/2020  10:10 PM EST To: Sueanne Margarita, MD  Traci, could someone from your office check with Mrs Burkes about her CPAP machine? She says her current machine was "recalled."  Not sure what she means by that.

## 2020-12-06 ENCOUNTER — Other Ambulatory Visit (HOSPITAL_COMMUNITY): Payer: Medicare HMO

## 2020-12-12 ENCOUNTER — Other Ambulatory Visit: Payer: Self-pay

## 2020-12-12 ENCOUNTER — Telehealth (HOSPITAL_COMMUNITY): Payer: Self-pay

## 2020-12-12 ENCOUNTER — Ambulatory Visit (HOSPITAL_COMMUNITY)
Admission: RE | Admit: 2020-12-12 | Discharge: 2020-12-12 | Disposition: A | Discharge status: Home / Self Care | Payer: Medicare HMO | Source: Ambulatory Visit | Attending: Cardiology | Admitting: Cardiology

## 2020-12-12 DIAGNOSIS — I5022 Chronic systolic (congestive) heart failure: Secondary | ICD-10-CM | POA: Diagnosis not present

## 2020-12-12 LAB — BASIC METABOLIC PANEL
Anion gap: 10 (ref 5–15)
BUN: 68 mg/dL — ABNORMAL HIGH (ref 8–23)
CO2: 29 mmol/L (ref 22–32)
Calcium: 10.2 mg/dL (ref 8.9–10.3)
Chloride: 99 mmol/L (ref 98–111)
Creatinine, Ser: 2.07 mg/dL — ABNORMAL HIGH (ref 0.44–1.00)
GFR, Estimated: 24 mL/min — ABNORMAL LOW (ref >60)
Glucose, Bld: 136 mg/dL — ABNORMAL HIGH (ref 70–99)
Potassium: 4.9 mmol/L (ref 3.5–5.1)
Sodium: 138 mmol/L (ref 135–145)

## 2020-12-12 MED ORDER — TORSEMIDE 20 MG PO TABS: 40.0000 mg | ORAL_TABLET | Freq: Two times a day (BID) | ORAL | 3 refills | Status: DC

## 2020-12-12 NOTE — Telephone Encounter (Signed)
-----   Message from Larey Dresser, MD sent at 12/12/2020  9:48 AM EST ----- Decrease torsemide to 40 mg bid, BMET in 10 days.

## 2020-12-19 ENCOUNTER — Ambulatory Visit: Payer: Medicare HMO | Admitting: Podiatry

## 2020-12-22 ENCOUNTER — Encounter: Payer: Self-pay | Admitting: Family Medicine

## 2020-12-22 ENCOUNTER — Ambulatory Visit (INDEPENDENT_AMBULATORY_CARE_PROVIDER_SITE_OTHER): Payer: Medicare HMO | Admitting: Family Medicine

## 2020-12-22 ENCOUNTER — Other Ambulatory Visit: Payer: Self-pay

## 2020-12-22 VITALS — BP 99/80 | HR 69 | Ht 68.0 in | Wt 220.4 lb

## 2020-12-22 DIAGNOSIS — R202 Paresthesia of skin: Secondary | ICD-10-CM

## 2020-12-22 DIAGNOSIS — G629 Polyneuropathy, unspecified: Secondary | ICD-10-CM

## 2020-12-22 DIAGNOSIS — M792 Neuralgia and neuritis, unspecified: Secondary | ICD-10-CM | POA: Diagnosis not present

## 2020-12-22 NOTE — Progress Notes (Signed)
    SUBJECTIVE:   CHIEF COMPLAINT / HPI:   Persistent Bilateral Hand Pain Patient presents for persistent bilateral hand pain. This has been ongoing for ~1 year but has become worse over the last 1 month or so. It is a tingling/burning sensation throughout the palmar aspect of her hands. No particular aggravating or alleviating factors. She is currently on Duloxetine 20mg  which does not provide relief. She trialed gabapentin multiple times in the past but did not tolerate it (made her unsteady on her feet/dizzy). Declined trial of capsaicin cream at last visit. She is wondering if she needs a referral to neurology (her cardiologist also recommended this).   PERTINENT  PMH / PSH: CAD s/p CABG, HFmrEF, a-fib, prior CVA, CKD stage 3, T2DM  OBJECTIVE:   BP 99/80   Pulse 69   Ht 5\' 8"  (1.727 m)   Wt 220 lb 6 oz (100 kg)   LMP  (LMP Unknown)   SpO2 98%   BMI 33.51 kg/m   Gen: alert, elderly female, NAD CV: RRR, normal S1/S2 Resp: breathing comfortably on room air MSK: diffuse osteoarthritic changes of bilateral hands, 2cm nontender, flesh-colored, hard nodule with central telangiectasia on L thumb near MCP joint (c/w mucoid cyst) Neuro: sensation to light touch intact in bilateral hands (both palmar and dorsal aspects)  ASSESSMENT/PLAN:   Hand tingling Presumed to be diabetic neuropathy in the past. However, most recent A1c was 6.7 (very well controlled), it does not involve her feet, and has been very persistent despite medication trials. Therefore, nerve conduction studies warranted at this point to confirm the diagnosis. -Referral placed to neurology -Consider Lyrica at next visit pending results of neuro referral/nerve conduction studies  Case discussed with Dr. Santiago Bumpers, [place]

## 2020-12-22 NOTE — Patient Instructions (Addendum)
It was great to see you!  Our plans for today:  - I have referred you to neurology for your hand pain. They should call to schedule an appointment.  Take care and seek immediate care sooner if you develop any concerns.   Dr. [facility]

## 2020-12-24 NOTE — Assessment & Plan Note (Signed)
Presumed to be diabetic neuropathy in the past. However, most recent A1c was 6.7 (very well controlled), it does not involve her feet, and has been very persistent despite medication trials. Therefore, nerve conduction studies warranted at this point to confirm the diagnosis. -Referral placed to neurology -Consider Lyrica at next visit pending results of neuro referral/nerve conduction studies

## 2020-12-26 ENCOUNTER — Other Ambulatory Visit: Payer: Self-pay

## 2020-12-26 ENCOUNTER — Ambulatory Visit (HOSPITAL_COMMUNITY)
Admission: RE | Admit: 2020-12-26 | Discharge: 2020-12-26 | Disposition: A | Discharge status: Home / Self Care | Payer: Medicare HMO | Source: Ambulatory Visit | Attending: Orthopaedic Surgery | Admitting: Orthopaedic Surgery

## 2020-12-26 DIAGNOSIS — I5022 Chronic systolic (congestive) heart failure: Secondary | ICD-10-CM | POA: Insufficient documentation

## 2020-12-26 LAB — BASIC METABOLIC PANEL
Anion gap: 12 (ref 5–15)
BUN: 65 mg/dL — ABNORMAL HIGH (ref 8–23)
CO2: 28 mmol/L (ref 22–32)
Calcium: 10.1 mg/dL (ref 8.9–10.3)
Chloride: 99 mmol/L (ref 98–111)
Creatinine, Ser: 2.06 mg/dL — ABNORMAL HIGH (ref 0.44–1.00)
GFR, Estimated: 24 mL/min — ABNORMAL LOW (ref >60)
Glucose, Bld: 132 mg/dL — ABNORMAL HIGH (ref 70–99)
Potassium: 4.1 mmol/L (ref 3.5–5.1)
Sodium: 139 mmol/L (ref 135–145)

## 2020-12-29 ENCOUNTER — Other Ambulatory Visit: Payer: Self-pay

## 2020-12-29 ENCOUNTER — Ambulatory Visit: Payer: Medicare HMO | Admitting: Podiatry

## 2020-12-29 DIAGNOSIS — M2042 Other hammer toe(s) (acquired), left foot: Secondary | ICD-10-CM

## 2020-12-29 DIAGNOSIS — M2041 Other hammer toe(s) (acquired), right foot: Secondary | ICD-10-CM

## 2020-12-29 DIAGNOSIS — M2012 Hallux valgus (acquired), left foot: Secondary | ICD-10-CM

## 2020-12-29 DIAGNOSIS — B351 Tinea unguium: Secondary | ICD-10-CM | POA: Diagnosis not present

## 2020-12-29 DIAGNOSIS — E1151 Type 2 diabetes mellitus with diabetic peripheral angiopathy without gangrene: Secondary | ICD-10-CM

## 2020-12-29 DIAGNOSIS — E1169 Type 2 diabetes mellitus with other specified complication: Secondary | ICD-10-CM | POA: Diagnosis not present

## 2020-12-29 DIAGNOSIS — M2011 Hallux valgus (acquired), right foot: Secondary | ICD-10-CM

## 2021-01-02 ENCOUNTER — Ambulatory Visit (INDEPENDENT_AMBULATORY_CARE_PROVIDER_SITE_OTHER): Payer: Medicare HMO | Admitting: Ophthalmology

## 2021-01-02 ENCOUNTER — Other Ambulatory Visit: Payer: Self-pay

## 2021-01-02 ENCOUNTER — Encounter (INDEPENDENT_AMBULATORY_CARE_PROVIDER_SITE_OTHER): Payer: Self-pay | Admitting: Ophthalmology

## 2021-01-02 DIAGNOSIS — H353222 Exudative age-related macular degeneration, left eye, with inactive choroidal neovascularization: Secondary | ICD-10-CM | POA: Diagnosis not present

## 2021-01-02 DIAGNOSIS — H353211 Exudative age-related macular degeneration, right eye, with active choroidal neovascularization: Secondary | ICD-10-CM | POA: Diagnosis not present

## 2021-01-02 DIAGNOSIS — H43822 Vitreomacular adhesion, left eye: Secondary | ICD-10-CM | POA: Diagnosis not present

## 2021-01-02 DIAGNOSIS — E113492 Type 2 diabetes mellitus with severe nonproliferative diabetic retinopathy without macular edema, left eye: Secondary | ICD-10-CM | POA: Diagnosis not present

## 2021-01-02 MED ORDER — BEVACIZUMAB 2.5 MG/0.1ML IZ SOSY
2.5000 mg | PREFILLED_SYRINGE | INTRAVITREAL | Status: AC | PRN
  Administered 2021-01-02: 2.5 mg via INTRAVITREAL

## 2021-01-02 NOTE — Assessment & Plan Note (Signed)
The nature of severe nonproliferative diabetic retinopathy discussed with the patient as well as the need for more frequent follow up and likely progression to proliferative disease in the near future. The options of continued observation versus panretinal photocoagulation at this time were reviewed as well as the risks, benefits, and alternatives. More recent option includes the use of ocular injectable medications to slow progression of retinal disease. Tight control of glucose, blood pressure, and serum lipid levels were recommended under the direction of general physician or endocrinologist, as well as avoidance of smoking and maintenance of normal body weight. The 2-year risk of progression to proliferative diabetic retinopathy is 60%.

## 2021-01-02 NOTE — Assessment & Plan Note (Signed)
>>  ASSESSMENT AND PLAN FOR SEVERE NONPROLIFERATIVE DIABETIC RETINOPATHY OF LEFT EYE (HCC) WRITTEN ON 01/02/2021  9:47 AM BY Edmon Crape, MD  The nature of severe nonproliferative diabetic retinopathy discussed with the patient as well as the need for more frequent follow up and likely progression to proliferative disease in the near future. The options of continued observation versus panretinal photocoagulation at this time were reviewed as well as the risks, benefits, and alternatives. More recent option includes the use of ocular injectable medications to slow progression of retinal disease. Tight control of glucose, blood pressure, and serum lipid levels were recommended under the direction of general physician or endocrinologist, as well as avoidance of smoking and maintenance of normal body weight. The 2-year risk of progression to proliferative diabetic retinopathy is 60%.

## 2021-01-02 NOTE — Assessment & Plan Note (Signed)
No signs of CNVM OS

## 2021-01-02 NOTE — Assessment & Plan Note (Signed)
Inner foveal distortion and retinal schisis from vitreal foveal traction yet no impact on acuity and intact outer retina will observe

## 2021-01-02 NOTE — Progress Notes (Signed)
01/02/2021     CHIEF COMPLAINT Patient presents for Retina Follow Up (6 Week AMD F/U OU, poss Avastin OD//Pt denies noticeable changes to [place] OU since last visit. Pt denies ocular pain, flashes of light, or floaters OU. //LBS: 140 this AM//)   HISTORY OF PRESENT ILLNESS: [name] is a 80 y.o. female who presents to the clinic today for:   HPI    Retina Follow Up    Patient presents with  Wet AMD.  In right eye.  This started 6 weeks ago.  Severity is mild.  Duration of 6 weeks.  Since onset it is stable. Additional comments: 6 Week AMD F/U OU, poss Avastin OD  Pt denies noticeable changes to [place] OU since last visit. Pt denies ocular pain, flashes of light, or floaters OU.   LBS: 140 this AM         Last edited by [name], [place] on 01/02/2021  8:40 AM. (History)      Referring physician: Alcus Dad, [name] [place],  [place] [address]  HISTORICAL INFORMATION:   Selected notes from the MEDICAL RECORD NUMBER    Lab Results  Component Value Date   HGBA1C 6.7 (A) 10/14/2020     CURRENT MEDICATIONS: No current outpatient medications on file. (Ophthalmic Drugs)   No current facility-administered medications for this visit. (Ophthalmic Drugs)   Current Outpatient Medications (Other)  Medication Sig  . acetaminophen (TYLENOL 8 HOUR) 650 MG CR tablet Take 1 tablet (650 mg total) by mouth every 8 (eight) hours as needed for pain.  Marland Kitchen apixaban (ELIQUIS) 5 MG TABS tablet Take 1 tablet (5 mg total) by mouth 2 (two) times daily.  Marland Kitchen atorvastatin (LIPITOR) 40 MG tablet TAKE 1 TABLET BY MOUTH  DAILY AT 6 PM.  . Blood Glucose Monitoring Suppl (ONE TOUCH ULTRA 2) w/Device KIT Please use to check blood sugar up to three times daily.  . carvedilol (COREG) 6.25 MG tablet TAKE 1 TABLET BY MOUTH  TWICE DAILY WITH A MEAL  . cholecalciferol (VITAMIN D) 25 MCG (1000 UNIT) tablet Take 1 tablet (1,000 Units total) by mouth daily.  . DULoxetine (CYMBALTA) 20 MG capsule Take  1 capsule (20 mg total) by mouth daily.  . empagliflozin (JARDIANCE) 10 MG TABS tablet Take 1 tablet (10 mg total) by mouth daily before breakfast.  . ferrous sulfate 325 (65 FE) MG tablet TAKE 1 TABLET BY MOUTH EVERY DAY WITH BREAKFAST  . glucose blood (ONE TOUCH ULTRA TEST) test strip Use three times a day  . Lancets (ONETOUCH DELICA PLUS JJHERD40C) MISC 1 application by Does not apply route daily. Please use to check blood sugar up to three times daily.  . nitroGLYCERIN (NITROSTAT) 0.4 MG SL tablet Place 1 tablet (0.4 mg total) under the tongue every 5 (five) minutes as needed for chest pain (up to 3 doses).  . potassium chloride SA (KLOR-CON) 20 MEQ tablet Take 3 tablets (60 mEq total) by mouth daily.  Marland Kitchen rOPINIRole (REQUIP) 0.5 MG tablet TAKE 2 TABLETS BY MOUTH AT  BEDTIME  . torsemide (DEMADEX) 20 MG tablet Take 2 tablets (40 mg total) by mouth 2 (two) times daily.  . vitamin B-12 (CYANOCOBALAMIN) 1000 MCG tablet Take 1 tablet (1,000 mcg total) by mouth daily.   No current facility-administered medications for this visit. (Other)      REVIEW OF SYSTEMS:    ALLERGIES Allergies  Allergen Reactions  . Benazepril Other (See Comments)    Unknown  reaction at age 37-65 - possibly dizziness  . Ozempic (0.25 Or 0.5 Mg-Dose) [Semaglutide(0.25 Or 0.40m-Dos)] Diarrhea    Gi intollerance  . Angiotensin Receptor Blockers Other (See Comments)    Hypotension reaction  . Fe-Succ-C-Thre-B12-Des Stomach Other (See Comments)    Unknown reaction  . Sulfamethoxazole-Trimethoprim Other (See Comments)    Unknown reaction    PAST MEDICAL HISTORY Past Medical History:  Diagnosis Date  . ACC/AHA stage C congestive heart failure due to ischemic cardiomyopathy [place] 01/31[place]   Qualifier: Diagnosis of  By: [name] Perches [name], FGlenetta [name]  . ANXIETY 01/31/2009   Qualifier: Diagnosis of  By: [name] Cliche CNA, [name]    . Arthritis    "qwhere" (03/30/2016)  . Benign essential HTN   . CAD (coronary artery  disease) 05/2008   a. s/p CABG in 2009.  .Marland KitchenCerebrovascular accident (CVA) due to embolism of right middle cerebral artery ([name]   . Chronic anticoagulation   . Chronic kidney disease (CKD), stage III (moderate) (HCC)   . Chronic lower back pain   . Chronic pain syndrome   . Chronic systolic CHF (congestive heart failure) ([name]   . DM type 2 with diabetic peripheral neuropathy ([name]   . Facial weakness, post-stroke   . Gastrointestinal hemorrhage 03/26/2017  . Hemiparesis ([name]   . History of CVA with residual deficit 03/26/2017  . Hyperlipidemia   . Hypertension   . Ischemic cardiomyopathy   . Longstanding persistent atrial fibrillation ([name]    a. Postoperative in 2009;  S/P transesophageal echocardiography-guided cardioversion, previously on Amiodarone and Coumadin. b. recurrent in April 2013 -> pt elected rate control strategy, initially Coumadin -> had stroke with subtherapeutic INR in 03/2016 and transitioned to Eliquis.  . Morbid obesity ([name] 03/26/2017  . OSA on CPAP    severe OSA with AHI 34/hr  . Persistent atrial fibrillation ([name]   . Thrombocytopenia ([name]    Past Surgical History:  Procedure Laterality Date  . APPENDECTOMY  1946  . CATARACT EXTRACTION Right 2012  . COLONOSCOPY WITH PROPOFOL Left 03/27/2017   Procedure: COLONOSCOPY WITH PROPOFOL;  Surgeon: [name];  [facility]  Service: Gastroenterology;  Laterality: Left;  . CORONARY ANGIOPLASTY WITH STENT PLACEMENT  2009   "put 2 stents in"  . HERNIA REPAIR    . IR LUMBAR DRichmondW/IMG GUIDE  10/08/2018  . JOINT REPLACEMENT    . TEE WITH CARDIOVERSION     Postoperative in 2009;  S/P transesophageal echocardiography-guided cardioversion,  . TOTAL KNEE ARTHROPLASTY Right 1994  . VENTRAL HERNIA REPAIR  10/1999   With mesh    FAMILY HISTORY Family History  Problem Relation Age of Onset  . Clotting disorder Mother        Cerebral hemorrhage  . Emphysema Father        COD  . Lung  cancer Brother   . Heart disease Neg Hx     SOCIAL HISTORY Social History   Tobacco Use  . Smoking status: Never Smoker  . Smokeless tobacco: Never Used  Vaping Use  . Vaping Use: Never used  Substance Use Topics  . Alcohol use: No  . Drug use: No         OPHTHALMIC EXAM: Base Eye Exam    Visual Acuity (ETDRS)      Right Left   Dist cc 20/40 20/40 -2   Dist ph cc NI NI   Correction: Glasses       Tonometry (Tonopen, 8:40 AM)  Right Left   Pressure 12 08       Pupils      Pupils Dark Light Shape React APD   Right PERRL 3 2 Round Brisk None   Left PERRL 3 2 Round Brisk None       Visual Fields (Counting fingers)      Left Right    Full Full       Extraocular Movement      Right Left    Full Full       Neuro/Psych    Oriented x3: Yes   Mood/Affect: Normal       Dilation    Both eyes: 1.0% Mydriacyl, 2.5% Phenylephrine @ 8:43 AM        Slit Lamp and Fundus Exam    External Exam      Right Left   External Normal Normal       Slit Lamp Exam      Right Left   Lids/Lashes Normal Normal   Conjunctiva/Sclera White and quiet White and quiet   Cornea Clear Clear   Anterior Chamber Deep and quiet Deep and quiet   Iris Round and reactive Round and reactive   Lens Posterior chamber intraocular lens Posterior chamber intraocular lens   Anterior Vitreous Normal Normal       Fundus Exam      Right Left   Posterior Vitreous Posterior vitreous detachment Partial posterior vitreous detachment   Disc Normal Normal   C/D Ratio 0.7 0.7   Macula Soft drusen, Retinal pigment epithelial mottling, no macular thickening Intermediate age related macular degeneration, Hard drusen, Pigmented atrophy   Vessels NPDR-Severe NPDR-Severe   Periphery Normal Normal          IMAGING AND PROCEDURES  Imaging and Procedures for 01/02/21  OCT, Retina - OU - Both Eyes       Right Eye Quality was good. Scan locations included subfoveal. Central Foveal  Thickness: 231. Progression has improved. Findings include abnormal foveal contour.   Left Eye Quality was good. Scan locations included subfoveal. Central Foveal Thickness: 296. Progression has been stable. Findings include abnormal foveal contour, vitreous traction, vitreomacular adhesion .   Notes OD with less subretinal fluid inferior to the fovea from wet ARMD on OCT.  At 6-week interval.  We will repeat injection Avastin OD today  OS with persistent vitreal foveal traction of the inner retina With  elevation and secondary inner retinal foveomacular schisis yet with good acuity we will continue to monitor and observe       Intravitreal Injection, Pharmacologic Agent - OD - Right Eye       Time Out 01/02/2021. 9:47 AM. Confirmed correct patient, procedure, site, and patient consented.   Anesthesia Topical anesthesia was used. Anesthetic medications included [place] 3.5%.   Procedure Preparation included Ofloxacin , Tobramycin 0.3%, 10% betadine to eyelids, 5% betadine to ocular surface. A 30 gauge needle was used.   Injection:  2.5 mg Bevacizumab (AVASTIN) 2.4m/0.1mL SOSY   NDC:: 09604-540-98 Lot:: [id number]  Route: Intravitreal, Site: Right Eye  Post-op Post injection exam found visual acuity of at least counting fingers. The patient tolerated the procedure well. There were no complications. The patient received written and verbal post procedure care education. Post injection medications were not given.                 ASSESSMENT/PLAN:  Exudative age-related macular degeneration of left eye with inactive choroidal neovascularization (HCC) No signs of CNVM OS  Exudative age-related macular degeneration of right eye with active choroidal neovascularization (HCC) Improved macular anatomy and stable CNVM at 6-week interval post Avastin.  We will repeat injection today and extend interval examination to 7 weeks  Vitreomacular adhesion of left eye Inner foveal distortion  and retinal schisis from vitreal foveal traction yet no impact on acuity and intact outer retina will observe  Severe nonproliferative diabetic retinopathy of left eye (HCC) The nature of severe nonproliferative diabetic retinopathy discussed with the patient as well as the need for more frequent follow up and likely progression to proliferative disease in the near future. The options of continued observation versus panretinal photocoagulation at this time were reviewed as well as the risks, benefits, and alternatives. More recent option includes the use of ocular injectable medications to slow progression of retinal disease. Tight control of glucose, blood pressure, and serum lipid levels were recommended under the direction of general physician or endocrinologist, as well as avoidance of smoking and maintenance of normal body weight. The 2-year risk of progression to proliferative diabetic retinopathy is 60%.      ICD-10-CM   1. Exudative age-related macular degeneration of right eye with active choroidal neovascularization (HCC)  H35.3211 OCT, Retina - OU - Both Eyes    Intravitreal Injection, Pharmacologic Agent - OD - Right Eye    bevacizumab (AVASTIN) SOSY 2.5 mg  2. Exudative age-related macular degeneration of left eye with inactive choroidal neovascularization ([place])  H35.3222   3. Vitreomacular adhesion of left eye  H43.822   4. Severe nonproliferative diabetic retinopathy of left eye without macular edema associated with type 2 diabetes mellitus ([place])  X10.6269     1.  OD, improved anatomy and stable overall on intravitreal Avastin today 6-week follow-up.  We will repeat injection today and examination again in 7 weeks OD  2.  No intervention or therapy required for vitreomacular adhesion and traction syndrome left eye on the inner fovea.  Will observe  3.  Nonproliferative diabetic retinopathy OU stable  Ophthalmic Meds Ordered this visit:  Meds ordered this encounter  Medications   . bevacizumab (AVASTIN) SOSY 2.5 mg       Return in about 7 weeks (around 02/20/2021) for dilate, OD, AVASTIN OCT.  There are no Patient Instructions on file for this visit.   Explained the diagnoses, plan, and follow up with the patient and they expressed understanding.  Patient expressed understanding of the importance of proper follow up care.   Clent Demark  M.D. Diseases & Surgery of the Retina and Vitreous [facility]ina [facility] Diabetic [place] 01/02/21     Abbreviations: M myopia (nearsighted); A astigmatism; H hyperopia (farsighted); P presbyopia; Mrx spectacle prescription;  CTL contact lenses; OD right eye; OS left eye; OU both eyes  XT exotropia; ET esotropia; PEK punctate epithelial keratitis; PEE punctate epithelial erosions; DES dry eye syndrome; MGD meibomian gland dysfunction; ATs artificial tears; PFAT's preservative free artificial tears; [place] nuclear sclerotic cataract; PSC posterior subcapsular cataract; ERM epi-retinal membrane; PVD posterior vitreous detachment; RD retinal detachment; DM diabetes mellitus; DR diabetic retinopathy; NPDR non-proliferative diabetic retinopathy; PDR proliferative diabetic retinopathy; CSME clinically significant macular edema; DME diabetic macular edema; dbh dot blot hemorrhages; CWS cotton wool spot; POAG primary open angle glaucoma; C/D cup-to-disc ratio; HVF humphrey visual field; GVF goldmann visual field; OCT optical coherence tomography; IOP intraocular pressure; BRVO Branch retinal vein occlusion; CRVO central retinal vein occlusion; CRAO central retinal artery occlusion; BRAO branch retinal artery occlusion; RT retinal tear; SB scleral buckle;  PPV pars plana vitrectomy; VH Vitreous hemorrhage; PRP panretinal laser photocoagulation; IVK intravitreal kenalog; VMT vitreomacular traction; MH Macular hole;  NVD neovascularization of the disc; NVE neovascularization elsewhere; AREDS age related eye disease study; ARMD age related macular degeneration;  POAG primary open angle glaucoma; EBMD epithelial/anterior basement membrane dystrophy; ACIOL anterior chamber intraocular lens; IOL intraocular lens; PCIOL posterior chamber intraocular lens; Phaco/IOL phacoemulsification with intraocular lens placement; [place] photorefractive keratectomy; LASIK laser assisted in situ keratomileusis; HTN hypertension; DM diabetes mellitus; COPD chronic obstructive pulmonary disease

## 2021-01-02 NOTE — Assessment & Plan Note (Signed)
Improved macular anatomy and stable CNVM at 6-week interval post Avastin.  We will repeat injection today and extend interval examination to 7 weeks

## 2021-01-10 ENCOUNTER — Other Ambulatory Visit: Payer: Self-pay

## 2021-01-10 ENCOUNTER — Ambulatory Visit (INDEPENDENT_AMBULATORY_CARE_PROVIDER_SITE_OTHER): Payer: Medicare HMO | Admitting: Family Medicine

## 2021-01-10 VITALS — BP 120/58 | HR 105 | Ht 69.0 in | Wt 215.0 lb

## 2021-01-10 DIAGNOSIS — R197 Diarrhea, unspecified: Secondary | ICD-10-CM

## 2021-01-10 DIAGNOSIS — K921 Melena: Secondary | ICD-10-CM

## 2021-01-10 HISTORY — DX: Melena: K92.1

## 2021-01-10 HISTORY — DX: Diarrhea, unspecified: R19.7

## 2021-01-10 LAB — POCT HEMOGLOBIN: Hemoglobin: 10.7 g/dL — AB (ref 11–14.6)

## 2021-01-10 NOTE — Patient Instructions (Signed)
Thank you for coming to see me today. It was a pleasure. Today we talked about:   Stop taking your Eliquis for two days.  Your risk of bleed right not outweighs your risk of stroke.  After two days, restart your Eliquis.    I have placed a referral to Gastroenterology for a follow up since they wanted to do a colonoscopy previously, but I don't see where it was repeated..  If you do not hear from them in the next 2 weeks, please give [place] a call.  Start a fiber supplement like metamucil or benefiber.  Continue to stay hydrated.  Please follow-up with PCP in 1 week.  If you have any questions or concerns, please do not hesitate to call the office at [phone].  Best,   [place] Constable, DO

## 2021-01-10 NOTE — Assessment & Plan Note (Addendum)
Consider melena versus change in stool color from Pepto-Bismol.  She has a prior history of GI bleed with significant anemia in 2018.  At that point, had a colonoscopy and tagged red blood cell scan which did not show obvious signs of bleed, but suspected to be diverticular in nature.  She was supposed to have a follow-up colonoscopy in 6 months due to inadequate prep, but do not see where this has been performed.  Given this, we will go ahead and refer her back to GI to see if they would like to do any further imaging on her.  Given her history, even if FOBT was negative, would still recommend holding Eliquis for a few days.  Discussed this with preceptor, Dr. Andria Frames, who agrees that risk of bleed right now outweighs her risk of stroke.  We will hold her Eliquis for 2 days, then resume.  We will have her follow-up in 1 week with PCP.  PCP could consider decreasing her dose of Eliquis to 2.5 mg twice daily given her age, but will defer to PCP and cardiologist.  We will also have her stop Pepto-Bismol.  She was given return precautions for anemia including chest pain, shortness of breath, dizziness, weakness, increased in melena or bright red blood per rectum.  Also discussed return precautions for signs and symptoms of stroke.  She voiced understanding.  We will have her follow-up in 1 week.

## 2021-01-10 NOTE — Assessment & Plan Note (Addendum)
Now improving.  She seems well-hydrated on exam.  Her heart rate was appropriate on examination today.  She has otherwise been feeling well and continues to be drinking and eating well.  We will have her increase fiber with a fiber supplement.  She will follow-up in 1 week.  Rest of plan per above.

## 2021-01-10 NOTE — Progress Notes (Signed)
SUBJECTIVE:   CHIEF COMPLAINT / HPI:   Diarrhea Started 2/25 No pain with this For first two days, it was runny Today it seems to be firming up more No bright red blood Has looked black since it started, not just dark, but definitely looked black She is on Eliquis Has been taking pepto-bismol since it started as well  No nausea or vomiting Has been eating and drinking normally Doesn't have diarrhea very often, but does once in a while Overall getting better No fevers She does not feel weak, feels like her normal self She hasn't changed her diet at all Before 2/25, her last BM was 2 days before this Doesn't have constipation Hasn't taken a stool softener in 2-3 weeks and hasn't needed it She usually takes dulcolax to keep her from straining She doesn't take any fiber supplements Sometimes when she drinks things with artifical sugars, she has black stools as well No recent NSAIDs She is down 5 lbs from 2/10, but has been trying to lose  Had colonoscopy 03/2017 that showed bleeding, then had tagged RBC scan which didn't show obvious cause, thought to be diverticular in nature Was supposed to gave repeat colonoscopy in 6 months, but this was not performed  PERTINENT  PMH / PSH: Hx GI bleed, HFrEF, HTN, CAD, A fib on Eliquis, pulmonary HTN, CKD  OBJECTIVE:   BP (!) 120/58   Pulse (!) 105   Ht 5\' 9"  (1.753 m)   Wt 215 lb (97.5 kg)   LMP  (LMP Unknown)   SpO2 99%   BMI 31.75 kg/m    Physical Exam:  General: 80 y.o. female in NAD Cardio: RRR no m/r/g, HR <100 on exam Lungs: CTAB, no wheezing, no rhonchi, no crackles, no IWOB on RA Abdomen: Soft, non-tender to palpation, non-distended, positive bowel sounds Skin: warm and dry  Results for orders placed or performed in visit on 01/10/21 (from the past 24 hour(s))  Hemoglobin     Status: Abnormal   Collection Time: 01/10/21  2:04 PM  Result Value Ref Range   Hemoglobin 10.7 (A) 11 - 14.6 g/dL     ASSESSMENT/PLAN:    Melena Consider melena versus change in stool color from Pepto-Bismol.  She has a prior history of GI bleed with significant anemia in 2018.  At that point, had a colonoscopy and tagged red blood cell scan which did not show obvious signs of bleed, but suspected to be diverticular in nature.  She was supposed to have a follow-up colonoscopy in 6 months due to inadequate prep, but do not see where this has been performed.  Given this, we will go ahead and refer her back to GI to see if they would like to do any further imaging on her.  Given her history, even if FOBT was negative, would still recommend holding Eliquis for a few days.  Discussed this with preceptor, Dr. Andria Frames, who agrees that risk of bleed right now outweighs her risk of stroke.  We will hold her Eliquis for 2 days, then resume.  We will have her follow-up in 1 week with PCP.  PCP could consider decreasing her dose of Eliquis to 2.5 mg twice daily given her age, but will defer to PCP and cardiologist.  We will also have her stop Pepto-Bismol.  She was given return precautions for anemia including chest pain, shortness of breath, dizziness, weakness, increased in melena or bright red blood per rectum.  Also discussed return precautions for signs  and symptoms of stroke.  She voiced understanding.  We will have her follow-up in 1 week.  Diarrhea Now improving.  She seems well-hydrated on exam.  Her heart rate was appropriate on examination today.  She has otherwise been feeling well and continues to be drinking and eating well.  We will have her increase fiber with a fiber supplement.  She will follow-up in 1 week.  Rest of plan per above.     Cleophas Dunker, [place]

## 2021-01-11 ENCOUNTER — Encounter (INDEPENDENT_AMBULATORY_CARE_PROVIDER_SITE_OTHER): Payer: Self-pay

## 2021-01-12 ENCOUNTER — Other Ambulatory Visit: Payer: Self-pay

## 2021-01-12 ENCOUNTER — Telehealth: Payer: Self-pay

## 2021-01-12 ENCOUNTER — Ambulatory Visit (INDEPENDENT_AMBULATORY_CARE_PROVIDER_SITE_OTHER): Payer: Medicare HMO | Admitting: Family Medicine

## 2021-01-12 VITALS — BP 110/52 | HR 80 | Wt 215.8 lb

## 2021-01-12 DIAGNOSIS — K921 Melena: Secondary | ICD-10-CM

## 2021-01-12 LAB — POCT HEMOGLOBIN: Hemoglobin: 12.5 g/dL (ref 11–14.6)

## 2021-01-12 NOTE — Patient Instructions (Signed)
It was great seeing you today.  I am sorry you are having these issues with the diarrhea.  We checked a hemoglobin today and your blood levels were improved from the previous check.  I want you to try Imodium to help with the diarrhea and monitor for any signs of bleeding in your stomach like black tarry stools.  I also want you to continue to hold your Eliquis for another 2 days.  Regarding your torsemide I want you to hold your afternoon dose because you are losing so much liquid in your stools.  You can restart this medication tomorrow.  Please be sure to follow-up next week to ensure that this is all resolved.  If you have any of the symptoms we discussed regarding a stomach bleed or stroke please go to the emergency department immediately.  If you have any issues, questions, concerns please feel free to call the clinic.  I hope you have a wonderful afternoon!

## 2021-01-12 NOTE — Progress Notes (Signed)
    SUBJECTIVE:   CHIEF COMPLAINT / HPI:   Continued diarrhea Patient reports that since her previous visit she has discontinued taking the Pepto-Bismol and has not been taking anything to help with the diarrhea.  She has Imodium but was afraid it would also turn her stools dark so was not been taking it.  She reports that she started to have formed stools yesterday morning but then yesterday afternoon into last night profuse dark diarrhea returned.  Reports that it was not as black as the previous diarrhea and is watery.  Denies it was sticky.  Denies any red blood.  She reports she has been trying to drink as much fluid as she can to stay hydrated.  She has been holding her Eliquis for the last 2 days but has taken all of her other medications.  PERTINENT  PMH / PSH: [place] fibrillation, GI bleed  OBJECTIVE:   BP (!) 110/52   Pulse 80   Wt 215 lb 12.8 oz (97.9 kg)   LMP  (LMP Unknown)   SpO2 98%   BMI 31.87 kg/m   General: Tired appearing 80 year old female in no acute distress HEENT: Moist mucous membranes Cardiac: Rate controlled irregularly irregular rhythm, no murmurs appreciated Respiratory: Normal work of breathing, lungs clear to auscultation bilaterally Abdomen: Soft, nontender, positive bowel sounds Skin: Warm, dry MSK: Able to ambulate with the assistance of walker  ASSESSMENT/PLAN:   Melena Melena is still in the differential for patient's diarrhea although I feel less likely.  We checked a repeat POC hemoglobin which was improved to 12.5 from 10.7 at the previous check.  She has not yet heard from the gastroenterologist but hopes to hear from them soon regarding scheduling an appointment with them.  We discussed the options and given her risk for GI bleed and the continued diarrhea we recommend holding the Eliquis for an additional 2 days.  She also reports considerable fluid loss with the diarrhea so she is going to hold her torsemide this afternoon but restart tomorrow.   We recommend she discuss with her cardiologist Eliquis dosing when she restarts, possibly decreasing to 2.5 mg given her age.  I have recommended that she start Imodium and see if this helps the diarrhea resolved.  Discussed at length the symptoms of a GI bleed as well as a stroke.  She agrees to seek help at the emergency department if she notices any of these symptoms.  She returned on 3/8 for follow-up visit.     Gifford Shave, MD [place]

## 2021-01-12 NOTE — Assessment & Plan Note (Signed)
[name] is still in the differential for patient's diarrhea although I feel less likely.  We checked a repeat POC hemoglobin which was improved to 12.5 from 10.7 at the previous check.  She has not yet heard from the gastroenterologist but hopes to hear from them soon regarding scheduling an appointment with them.  We discussed the options and given her risk for GI bleed and the continued diarrhea we recommend holding the Eliquis for an additional 2 days.  She also reports considerable fluid loss with the diarrhea so she is going to hold her torsemide this afternoon but restart tomorrow.  We recommend she discuss with her cardiologist Eliquis dosing when she restarts, possibly decreasing to 2.5 mg given her age.  I have recommended that she start Imodium and see if this helps the diarrhea resolved.  Discussed at length the symptoms of a GI bleed as well as a stroke.  She agrees to seek help at the emergency department if she notices any of these symptoms.  She returned on 3/8 for follow-up visit.

## 2021-01-12 NOTE — Telephone Encounter (Signed)
Patient calls nurse line with concerns for continued diarrhea. Patient reports she was told at 3/1 visit to hold Eliquis for 2 days in hopes of improving. Patient states she feels yesterday was a little better, however last night around 10pm she started having diarrhea again and has continued into today. Patient is worried as she is beginning to feel fatigue and requests to be seen today. Patient scheduled for ATC this morning.

## 2021-01-16 NOTE — Progress Notes (Signed)
    SUBJECTIVE:   CHIEF COMPLAINT / HPI:   Melena/Diarrhea follow-up Patient was seen on 3/1 and 3/3 for concerns of melena and diarrhea. She held her Eliquis for 4 days per provider instructions, but has resumed it now. She was also referred to GI at that time for colonoscopy. States she has not yet heard from GI. Today, patient reports her diarrhea is improving significantly-- she notes 3 episodes yesterday and 1 today. She has been following the [employer] which is helpful. She reports her melena has resolved completely. States her stools are not dark anymore at all-- now just brown and watery. No fever, vomiting, abdominal pain, or other concerns.  Diabetes Follow-Up Patient has a hx of diabetes and takes Jardiance 10mg  daily. Reports excellent medication compliance. She denies symptoms of hyper/hypoglycemia.  She is UTD on diabetic foot and eye exams Has had BMP in last 1 year. A1c has been well controlled (6.5-6.7 for past 3 years)  PERTINENT  PMH / PSH: Hx of GI bleed (2018), CAD s/p CABG, HFmrEF, a-fib on Eliquis, prior CVA, CKD stage 3, HTN, T2DM  OBJECTIVE:   BP 98/68 Comment: auto machine  Pulse 75   Ht 5\' 9"  (1.753 m)   Wt 210 lb 4 oz (95.4 kg)   LMP  (LMP Unknown)   SpO2 99%   BMI 31.05 kg/m   Gen: alert, elderly female, NAD HEENT: moist mucous membranes Resp: normal WOB on room air Abd: soft, nontender  ASSESSMENT/PLAN:   DM type 2 with diabetic peripheral neuropathy (HCC) Very well-controlled, A1c remains at goal. POC A1c 6.6 today. -Continue Jardiance 10mg  daily -Recheck A1c in 62mo-51yr  Diarrhea Improving. Likely related to viral GI illness. Given patient's ongoing GI losses in combination with her age, CKD, and diuretic use, will check BMP today to monitor kidney function and electrolytes. May need to adjust diuretic dose accordingly. Will also check CBC to ensure Hgb is stable and there are no ongoing concerns for GI bleed. Patient instructed to call GI for  an appt if she has not heard from them in another week.    Alcus Dad, MD [place]

## 2021-01-16 NOTE — Progress Notes (Signed)
  Subjective:  Patient ID: [name], female    DOB: Aug 29, 1941,  MRN: [id number]  Chief Complaint  Patient presents with  . debridement    DFC -FBS: 140 A1c" 6.1 PCP: [facility] x 1 wk    80 y.o. female presents with the above complaint. History confirmed with patient.   Objective:  Physical Exam: warm, good capillary refill, nail exam onychomycosis of the toenails, no trophic changes or ulcerative lesions, normal DP and non-palp PT pulses, and normal sensory exam. Left Foot: bunion deformity noted and hammertoes  Right Foot: bunion deformity noted and hammertoes   No images are attached to the encounter.  Assessment:   1. Onychomycosis of multiple toenails with type 2 diabetes mellitus and peripheral angiopathy ([place])   2. Acquired hallux valgus of both feet   3. Hammer toes of both feet    Plan:  Patient was evaluated and treated and all questions answered.  Onychomycosis, Diabetes and PAD -At risk foot care provided as below -Would benefit from diabetic shoes   Procedure: Nail Debridement Type of Debridement: manual, sharp debridement. Instrumentation: Nail nipper, rotary burr. Number of Nails: 10        No follow-ups on file.

## 2021-01-17 ENCOUNTER — Encounter: Payer: Self-pay | Admitting: Family Medicine

## 2021-01-17 ENCOUNTER — Ambulatory Visit (INDEPENDENT_AMBULATORY_CARE_PROVIDER_SITE_OTHER): Payer: Medicare HMO | Admitting: Family Medicine

## 2021-01-17 ENCOUNTER — Other Ambulatory Visit: Payer: Self-pay

## 2021-01-17 VITALS — BP 98/68 | HR 75 | Ht 69.0 in | Wt 210.2 lb

## 2021-01-17 DIAGNOSIS — R197 Diarrhea, unspecified: Secondary | ICD-10-CM

## 2021-01-17 DIAGNOSIS — E1142 Type 2 diabetes mellitus with diabetic polyneuropathy: Secondary | ICD-10-CM

## 2021-01-17 DIAGNOSIS — K921 Melena: Secondary | ICD-10-CM

## 2021-01-17 LAB — POCT GLYCOSYLATED HEMOGLOBIN (HGB A1C): Hemoglobin A1C: 6.6 % — AB (ref 4.0–5.6)

## 2021-01-17 NOTE — Assessment & Plan Note (Signed)
Very well-controlled, A1c remains at goal. POC A1c 6.6 today. -Continue Jardiance 10mg  daily -Recheck A1c in 26mo-45yr

## 2021-01-17 NOTE — Assessment & Plan Note (Addendum)
Improving. Likely related to viral GI illness. Given patient's ongoing GI losses in combination with her age, CKD, and diuretic use, will check BMP today to monitor kidney function and electrolytes. May need to adjust diuretic dose accordingly. Will also check CBC to ensure Hgb is stable and there are no ongoing concerns for GI bleed. Patient instructed to call GI for an appt if she has not heard from them in another week.

## 2021-01-17 NOTE — Patient Instructions (Addendum)
It was great to see you!  Our plans for today:  -Continue the BRAT diet until your stool is no longer loose. Then you can start introducing low-fat, bland foods and gradually work your way back to your regular diet. Be sure to stay well-hydrated.  -If you haven't heard from the GI doctor by Tuesday the 15th, you can call them at [phone] and try to schedule an appointment  -When you see the cardiologist, ask about reducing your Eliquis dose to 2.5mg   -Your A1c was 6.6% today, which is excellent! We don't need to check it again for 6 months.  Take care and seek immediate care sooner if you develop any concerns.   Dr. [facility]

## 2021-01-18 ENCOUNTER — Ambulatory Visit (INDEPENDENT_AMBULATORY_CARE_PROVIDER_SITE_OTHER): Payer: Medicare HMO | Admitting: Podiatry

## 2021-01-18 DIAGNOSIS — M2012 Hallux valgus (acquired), left foot: Secondary | ICD-10-CM

## 2021-01-18 DIAGNOSIS — M2042 Other hammer toe(s) (acquired), left foot: Secondary | ICD-10-CM

## 2021-01-18 DIAGNOSIS — E1151 Type 2 diabetes mellitus with diabetic peripheral angiopathy without gangrene: Secondary | ICD-10-CM

## 2021-01-18 DIAGNOSIS — M2011 Hallux valgus (acquired), right foot: Secondary | ICD-10-CM

## 2021-01-18 DIAGNOSIS — M2041 Other hammer toe(s) (acquired), right foot: Secondary | ICD-10-CM

## 2021-01-18 LAB — BASIC METABOLIC PANEL
BUN/Creatinine Ratio: 33 — ABNORMAL HIGH (ref 12–28)
BUN: 62 mg/dL — ABNORMAL HIGH (ref 8–27)
CO2: 18 mmol/L — ABNORMAL LOW (ref 20–29)
Calcium: 10.2 mg/dL (ref 8.7–10.3)
Chloride: 99 mmol/L (ref 96–106)
Creatinine, Ser: 1.86 mg/dL — ABNORMAL HIGH (ref 0.57–1.00)
Glucose: 113 mg/dL — ABNORMAL HIGH (ref 65–99)
Potassium: 4.3 mmol/L (ref 3.5–5.2)
Sodium: 138 mmol/L (ref 134–144)
eGFR: 27 mL/min/{1.73_m2} — ABNORMAL LOW (ref >59)

## 2021-01-18 LAB — CBC
Hematocrit: 41.4 % (ref 34.0–46.6)
Hemoglobin: 13.4 g/dL (ref 11.1–15.9)
MCH: 31.3 pg (ref 26.6–33.0)
MCHC: 32.4 g/dL (ref 31.5–35.7)
MCV: 97 fL (ref 79–97)
Platelets: 200 10*3/uL (ref 150–450)
RBC: 4.28 x10E6/uL (ref 3.77–5.28)
RDW: 12.1 % (ref 11.7–15.4)
WBC: 5.1 10*3/uL (ref 3.4–10.8)

## 2021-01-18 NOTE — Progress Notes (Signed)
Patient presented for foam casting for 3 pair custom diabetic shoes inserts. Patient is measured with a [employer] device to be a size 11 wide.  Diabetic shoes are chosen from the [employer]. The shoes chosen are [id number]  The patient will be contacted when the shoes and inserts are ready to be picked up.

## 2021-01-23 ENCOUNTER — Telehealth: Payer: Self-pay

## 2021-01-23 NOTE — Telephone Encounter (Signed)
Returned patient's call. Informed her that it was ok to wait until 4/29 to see GI. She will contact our office if she has ongoing issues and needs to see a provider sooner.

## 2021-01-23 NOTE — Telephone Encounter (Signed)
Patient calls nurse line wanting to update PCP on colonoscopy. Patient reports she was referred to Dr. Therisa Doyne at [facility], however they can not get her in until 4/29. Patient would like to know if PCP is comfortable waiting that long or if she should be referred elsewhere. Please advise.

## 2021-01-26 NOTE — Progress Notes (Signed)
[facility]    Provider:  Dr Jaynee Eagles Requesting Provider: Leeanne Rio, MD Primary Care Provider:  Alcus Dad, MD  CC: Evaluate for EMG nerve conduction studies on the upper extremities.  HPI:  [name] is a 80 y.o. female here as requested by Leeanne Rio, MD for neuropathic hand pain. She has numbness in her hands. All her fingers are numb and tingly. Worse at night. She keeps dropping her needle. Both hands. Ongoing for 6 months. She is dropping things. Mainly at night and in bed. They burn. The hands are symmetrical. The feet are not affected. Worsening. Very painful. No neck pain and no radicular symptoms. She has pain in the arms, feels it is in the shoulder or rotater cuff, does not correlate with the hand symptoms. Carpal tunnel wrist splints help.   I reviewed notes from her primary care office, patient with persistent bilateral hand pain, ongoing for over a year, worse over the last month, tingling burning sensation throughout the palmar aspect of her hands, no particular aggravating relieving factors, she is currently on duloxetine 20 mg which does not provide relief, she tried gabapentin multiple times in the past but did not tolerate it made her unsteady her feeling dizzy, declined trial of capsaicin cream at last visit.  The etiology of the hand pain is presumed to be diabetic neuropathy in the past and it does involve her feet as well and has been very persistent despite medication trials.  Requesting nerve conduction studies.  Lyrica was considered pending results of this referral.  Review of Systems: Patient complains of symptoms per HPI as well as the following symptoms: hand pain, arthritis. Pertinent negatives and positives per HPI. All others negative.   Social History   Socioeconomic History  . Marital status: Widowed    Spouse name: Not on file  . Number of children: 2  . Years of education: 39  . Highest education level:  Not on file  Occupational History    Comment: retired  Tobacco Use  . Smoking status: Never Smoker  . Smokeless tobacco: Never Used  Vaping Use  . Vaping Use: Never used  Substance and Sexual Activity  . Alcohol use: No  . Drug use: Never  . Sexual activity: Not Currently  Other Topics Concern  . Not on file  Social History Narrative   Widowed   01/27/21 Lives with son,  in [place]   2 children   Not routinely exercising   Social Determinants of Health   Financial Resource Strain: Not on file  Food Insecurity: Not on file  Transportation Needs: Not on file  Physical Activity: Not on file  Stress: Not on file  Social Connections: Not on file  Intimate Partner Violence: Not on file    Family History  Problem Relation Age of Onset  . Clotting disorder Mother        Cerebral hemorrhage  . Other Mother        cerebral hemorrhage  . Emphysema Father        COD  . Lung cancer Brother   . Heart disease Neg Hx     Past Medical History:  Diagnosis Date  . ACC/AHA stage C congestive heart failure due to ischemic cardiomyopathy ([place]) 01/31/2009   Qualifier: Diagnosis of  By: Olevia Perches, MD, Glenetta Hew   . ANXIETY 01/31/2009   Qualifier: Diagnosis of  By: Lovette Cliche, CNA, Christy    . Arthritis    "qwhere" (03/30/2016)  .  Benign essential HTN   . CAD (coronary artery disease) 05/2008   a. s/p CABG in 2009.  Marland Kitchen Cerebrovascular accident (CVA) due to embolism of right middle cerebral artery ([place])   . Chronic anticoagulation   . Chronic kidney disease (CKD), stage III (moderate) (HCC)   . Chronic lower back pain   . Chronic pain syndrome   . Chronic systolic CHF (congestive heart failure) ([place])   . DM type 2 with diabetic peripheral neuropathy ([place])   . Facial weakness, post-stroke   . Gastrointestinal hemorrhage 03/26/2017  . Hemiparesis ([place])   . History of CVA with residual deficit 03/26/2017  . Hyperlipidemia   . Hypertension   . Ischemic cardiomyopathy   .  Longstanding persistent atrial fibrillation ([place])    a. Postoperative in 2009;  S/P transesophageal echocardiography-guided cardioversion, previously on Amiodarone and Coumadin. b. recurrent in April 2013 -> pt elected rate control strategy, initially Coumadin -> had stroke with subtherapeutic INR in 03/2016 and transitioned to Eliquis.  . Macular degeneration   . Morbid obesity ([place]) 03/26/2017  . OSA on CPAP    severe OSA with AHI 34/hr  . Persistent atrial fibrillation ([place])   . Thrombocytopenia [facility])     Patient Active Problem List   Diagnosis Date Noted  . Melena 01/10/2021  . Diarrhea 01/10/2021  . Severe nonproliferative diabetic retinopathy of left eye ([place]) 01/02/2021  . Acute cystitis with hematuria 09/01/2020  . Gabapentin adverse reaction 09/01/2020  . Severe nonproliferative diabetic retinopathy of right eye ([place]) 06/14/2020  . Hand tingling 06/02/2020  . Exudative age-related macular degeneration of right eye with active choroidal neovascularization ([place]) 05/02/2020  . Exudative age-related macular degeneration of left eye with inactive choroidal neovascularization ([place]) 03/29/2020  . Vitreomacular adhesion of left eye 03/29/2020  . Left retinoschisis 03/29/2020  . Decubitus ulcer of coccyx, stage 2 ([place]) 10/13/2018  . CHF (congestive heart failure) ([place])   . Vertebral osteomyelitis ([place]) 10/06/2018  . Foot deformity 08/05/2018  . Intertrigo 08/05/2018  . Iron deficiency anemia 05/19/2018  . Cloudy urine 04/03/2018  . Dysuria 11/26/2017  . OSA on CPAP   . Hypokalemia 11/20/2017  . Longstanding persistent atrial fibrillation ([place])   . Ischemic cardiomyopathy   . Hypertension   . Hemiparesis ([place])   . Facial weakness, post-stroke   . Chronic systolic CHF (congestive heart failure) ([place])   . Chronic lower back pain   . Arthritis   . Venous stasis 09/13/2017  . Bilateral leg edema 07/18/2017  . Blood loss anemia 04/09/2017  . History of CVA with residual deficit  03/26/2017  . Pulmonary hypertension ([place]) 03/26/2017  . Morbid obesity ([place]) 03/26/2017  . Gastrointestinal hemorrhage 03/26/2017  . Rectal bleeding   . Chronic pain syndrome   . Coronary artery disease involving coronary bypass graft of native heart without angina pectoris   . Benign essential HTN   . DM type 2 with diabetic peripheral neuropathy ([employer])   . Thrombocytopenia ([place])   . Cerebrovascular accident (CVA) due to embolism of right middle cerebral artery ([place])   . CKD (chronic kidney disease) stage 3, GFR 30-59 ml/min (HCC) 03/30/2016  . Chronic anticoagulation 03/30/2016  . Hyperlipidemia 01/31/2009  . ACC/AHA stage C congestive heart failure due to ischemic cardiomyopathy ([place]) 01/31/2009  . Chronic systolic heart failure ([place]) 01/31/2009  . Atrial fibrillation ([place]) 01/31/2009  . CAD (coronary artery disease) 05/12/2008    Past Surgical History:  Procedure Laterality Date  . APPENDECTOMY  1946  . CATARACT  EXTRACTION Right 2012  . COLONOSCOPY WITH PROPOFOL Left 03/27/2017   Procedure: COLONOSCOPY WITH PROPOFOL;  Surgeon: Ronnette Juniper, MD;  Location: [facility];  Service: Gastroenterology;  Laterality: Left;  . CORONARY ANGIOPLASTY WITH STENT PLACEMENT  2009   "put 2 stents in"  . HERNIA REPAIR    . IR LUMBAR [place] W/IMG GUIDE  10/08/2018  . JOINT REPLACEMENT    . TEE WITH CARDIOVERSION     Postoperative in 2009;  S/P transesophageal echocardiography-guided cardioversion,  . TOTAL KNEE ARTHROPLASTY Right 1994  . VENTRAL HERNIA REPAIR  10/1999   With mesh    Current Outpatient Medications  Medication Sig Dispense Refill  . acetaminophen (TYLENOL 8 HOUR) 650 MG CR tablet Take 1 tablet (650 mg total) by mouth every 8 (eight) hours as needed for pain. 30 tablet 0  . apixaban (ELIQUIS) 5 MG TABS tablet Take 1 tablet (5 mg total) by mouth 2 (two) times daily. 180 tablet 3  . atorvastatin (LIPITOR) 40 MG tablet TAKE 1 TABLET BY MOUTH  DAILY AT 6 PM. 90 tablet 3   . Blood Glucose Monitoring Suppl (ONE TOUCH ULTRA 2) w/Device KIT Please use to check blood sugar up to three times daily. 1 kit 0  . carvedilol (COREG) 6.25 MG tablet TAKE 1 TABLET BY MOUTH  TWICE DAILY WITH A MEAL 180 tablet 3  . cholecalciferol (VITAMIN D) 25 MCG (1000 UNIT) tablet Take 1 tablet (1,000 Units total) by mouth daily. 90 tablet 1  . empagliflozin (JARDIANCE) 10 MG TABS tablet Take 1 tablet (10 mg total) by mouth daily before breakfast. 90 tablet 3  . ferrous sulfate 325 (65 FE) MG tablet TAKE 1 TABLET BY MOUTH EVERY DAY WITH BREAKFAST 90 tablet 1  . glucose blood (ONE TOUCH ULTRA TEST) test strip Use three times a day 100 each 3  . Lancets (ONETOUCH DELICA PLUS AOZHYQ65H) MISC 1 application by Does not apply route daily. Please use to check blood sugar up to three times daily. 100 each 6  . nitroGLYCERIN (NITROSTAT) 0.4 MG SL tablet Place 1 tablet (0.4 mg total) under the tongue every 5 (five) minutes as needed for chest pain (up to 3 doses). 15 tablet 5  . potassium chloride SA (KLOR-CON) 20 MEQ tablet Take 3 tablets (60 mEq total) by mouth daily. 450 tablet 3  . torsemide (DEMADEX) 20 MG tablet Take 2 tablets (40 mg total) by mouth 2 (two) times daily. 360 tablet 3  . vitamin B-12 (CYANOCOBALAMIN) 1000 MCG tablet Take 1 tablet (1,000 mcg total) by mouth daily. 90 tablet 1  . rOPINIRole (REQUIP) 0.5 MG tablet TAKE 2 TABLETS BY MOUTH AT  BEDTIME (Patient not taking: Reported on 01/27/2021) 180 tablet 3   No current facility-administered medications for this visit.    Allergies as of 01/27/2021 - Review Complete 01/27/2021  Allergen Reaction Noted  . Benazepril Other (See Comments) 03/30/2016  . Ozempic (0.25 or 0.5 mg-dose) [semaglutide(0.25 or 0.5mg -dos)] Diarrhea 07/04/2020  . Angiotensin receptor blockers Other (See Comments) 03/26/2017  . Fe-succ-c-thre-b12-des stomach Other (See Comments) 05/04/2009  . Sulfamethoxazole-trimethoprim Other (See Comments) 05/04/2009     Vitals: BP (!) 103/57   Pulse 87   Ht 5\' 9"  (1.753 m)   Wt 209 lb (94.8 kg)   LMP  (LMP Unknown)   BMI 30.86 kg/m  Last Weight:  Wt Readings from Last 1 Encounters:  01/27/21 209 lb (94.8 kg)   Last Height:   Ht Readings from Last 1 Encounters:  01/27/21 '5\' 9"'$  (1.753 m)    Physical exam: Exam: Gen: NAD, conversant, well nourised, obese, well groomed                     CV: RRR, no MRG. No Carotid Bruits. No peripheral edema, warm, nontender Eyes: Conjunctivae clear without exudates or hemorrhage  Neuro: Detailed Neurologic Exam  Speech:    Speech is normal; fluent and spontaneous with normal comprehension.  Cognition:    The patient is oriented to person, place, and time;     recent and remote memory intact;     language fluent;     normal attention, concentration,     fund of knowledge Cranial Nerves:    The pupils are equal, round, and reactive to light.pupils too small to visualize fundi. Visual fields are full to threat. Extraocular movements are intact. Trigeminal sensation is intact and the muscles of mastication are normal. The face is symmetric. The palate elevates in the midline. Hearing intact. Voice is normal. Shoulder shrug is normal. The tongue has normal motion without fasciculations.   Coordination:    No dysmetria or ataxia noted   Gait: Uses a walker, slightly narrow based and cautiou  Motor Observation:    Distal atrophy in the hands, no involuntary movements noted. Tone:    Normal muscle tone.    Posture:    Posture is normal when sitting and standing, using walker    Strength:      Limited by pain with some prox weakness but no apparent focal motor deficit.     Sensation: intact to LT     Reflex Exam:  DTR's:    Deep tendon reflexes in the upper and lower extremities are symmetrical bilaterally.  Hypo lower extremities. Toes:    The toes are equiv bilaterally.   Clonus:    Clonus is absent.   +mcphalens maneuver and Tinels  sign at wrists  Assessment/Plan:  Lovely 80 year old with hand pain, likely CTS but will order emg/ncs. Discussed using wrist braces until then and limiting wrist flexion and extension.  Orders Placed This Encounter  Procedures  . NCV with EMG(electromyography)   No orders of the defined types were placed in this encounter.   Cc: Leeanne Rio, MD,  Alcus Dad, MD  Sarina Ill, MD  [facility] [address] [place], [place] [address]  Phone [phone] Fax [phone]

## 2021-01-27 ENCOUNTER — Encounter: Payer: Self-pay | Admitting: Neurology

## 2021-01-27 ENCOUNTER — Telehealth: Payer: Self-pay | Admitting: Neurology

## 2021-01-27 ENCOUNTER — Ambulatory Visit: Payer: Medicare HMO | Admitting: Neurology

## 2021-01-27 ENCOUNTER — Other Ambulatory Visit: Payer: Self-pay

## 2021-01-27 VITALS — BP 103/57 | HR 87 | Ht 69.0 in | Wt 209.0 lb

## 2021-01-27 DIAGNOSIS — M79641 Pain in right hand: Secondary | ICD-10-CM

## 2021-01-27 DIAGNOSIS — M79642 Pain in left hand: Secondary | ICD-10-CM

## 2021-01-27 NOTE — Patient Instructions (Signed)
Electromyoneurogram Electromyoneurogram is a test to check how well your muscles and nerves are working. This procedure includes the combined use of electromyogram (EMG) and nerve conduction study (NCS). EMG is used to look for muscular disorders. NCS, which is also called electroneurogram, measures how well your nerves are controlling your muscles. The procedures are usually done together to check if your muscles and nerves are healthy. If the results of the tests are abnormal, this may indicate disease or injury, such as a neuromuscular disease or peripheral nerve damage. Tell a health care provider about:  Any allergies you have.  All medicines you are taking, including vitamins, herbs, eye drops, creams, and over-the-counter medicines.  Any problems you or family members have had with anesthetic medicines.  Any blood disorders you have.  Any surgeries you have had.  Any medical conditions you have.  If you have a pacemaker.  Whether you are pregnant or may be pregnant. What are the risks? Generally, this is a safe procedure. However, problems may occur, including:  Infection where the electrodes were inserted.  Bleeding. What happens before the procedure? Medicines Ask your health care provider about:  Changing or stopping your regular medicines. This is especially important if you are taking diabetes medicines or blood thinners.  Taking medicines such as aspirin and ibuprofen. These medicines can thin your blood. Do not take these medicines unless your health care provider tells you to take them.  Taking over-the-counter medicines, vitamins, herbs, and supplements. General instructions  Your health care provider may ask you to avoid: ? Beverages that have caffeine, such as coffee and tea. ? Any products that contain nicotine or tobacco. These products include cigarettes, e-cigarettes, and chewing tobacco. If you need help quitting, ask your health care provider.  Do  not use lotions or creams on the same day that you will be having the procedure. What happens during the procedure? For EMG  Your health care provider will ask you to stay in a position so that he or she can access the muscle that will be studied. You may be standing, sitting, or lying down.  You may be given a medicine that numbs the area (local anesthetic).  A very thin needle that has an electrode will be inserted into your muscle.  Another small electrode will be placed on your skin near the muscle.  Your health care provider will ask you to continue to remain still.  The electrodes will send a signal that tells about the electrical activity of your muscles. You may see this on a monitor or hear it in the room.  After your muscles have been studied at rest, your health care provider will ask you to contract or flex your muscles. The electrodes will send a signal that tells about the electrical activity of your muscles.  Your health care provider will remove the electrodes and the electrode needles when the procedure is finished. The procedure may vary among health care providers and hospitals.   For NCS  An electrode that records your nerve activity (recording electrode) will be placed on your skin by the muscle that is being studied.  An electrode that is used as a reference (reference electrode) will be placed near the recording electrode.  A paste or gel will be applied to your skin between the recording electrode and the reference electrode.  Your nerve will be stimulated with a mild shock. Your health care provider will measure how much time it takes for your muscle to  react.  Your health care provider will remove the electrodes and the gel when the procedure is finished. The procedure may vary among health care providers and hospitals.   What happens after the procedure?  It is up to you to get the results of your procedure. Ask your health care provider, or the department  that is doing the procedure, when your results will be ready.  Your health care provider may: ? Give you medicines for any pain. ? Monitor the insertion sites to make sure that bleeding stops. Summary  Electromyoneurogram is a test to check how well your muscles and nerves are working.  If the results of the tests are abnormal, this may indicate disease or injury.  This is a safe procedure. However, problems may occur, such as bleeding and infection.  Your health care provider will do two tests to complete this procedure. One checks your muscles (EMG) and another checks your nerves (NCS).  It is up to you to get the results of your procedure. Ask your health care provider, or the department that is doing the procedure, when your results will be ready. This information is not intended to replace advice given to you by your health care provider. Make sure you discuss any questions you have with your health care provider. Document Revised: 07/15/2018 Document Reviewed: 06/27/2018 Elsevier Patient Education  2021 [place] Syndrome  Carpal tunnel syndrome is a condition that causes pain, weakness, and numbness in your hand and arm. Numbness is when you cannot feel an area in your body. The carpal tunnel is a narrow area that is on the palm side of your wrist. Repeated wrist motion or certain diseases may cause swelling in the tunnel. This swelling can pinch the main nerve in the wrist. This nerve is called the median nerve. What are the causes? This condition may be caused by:  Moving your hand and wrist over and over again while doing a task.  Injury to the wrist.  Arthritis.  A sac of fluid (cyst) or abnormal growth (tumor) in the carpal tunnel.  Fluid buildup during pregnancy.  Use of tools that vibrate. Sometimes the cause is not known. What increases the risk? The following factors may make you more likely to have this condition:  Having a job that makes  you do these things: ? Move your hand over and over again. ? Work with tools that vibrate, such as drills or sanders.  Being a woman.  Having diabetes, obesity, thyroid problems, or kidney failure. What are the signs or symptoms? Symptoms of this condition include:  A tingling feeling in your fingers.  Tingling or loss of feeling in your hand.  Pain in your entire arm. This pain may get worse when you bend your wrist and elbow for a long time.  Pain in your wrist that goes up your arm to your shoulder.  Pain that goes down into your palm or fingers.  Weakness in your hands. You may find it hard to grab and hold items. You may feel worse at night. How is this treated? This condition may be treated with:  Lifestyle changes. You will be asked to stop or change the activity that caused your problem.  Doing exercises and activities that make bones, muscles, and tendons stronger (physical therapy).  Learning how to use your hand again (occupational therapy).  Medicines for pain and swelling. You may have injections in your wrist.  A wrist splint or brace.  Surgery.  Follow these instructions at home: If you have a splint or brace:  Wear the splint or brace as told by your doctor. Take it off only as told by your doctor.  Loosen the splint if your fingers: ? Tingle. ? Become numb. ? Turn cold and blue.  Keep the splint or brace clean.  If the splint or brace is not waterproof: ? Do not let it get wet. ? Cover it with a watertight covering when you take a bath or a shower. Managing pain, stiffness, and swelling If told, put ice on the painful area:  If you have a removable splint or brace, remove it as told by your doctor.  Put ice in a plastic bag.  Place a towel between your skin and the bag.  Leave the ice on for 20 minutes, 2-3 times per day. Do not fall asleep with the cold pack on your skin.  Take off the ice if your skin turns bright red. This is very  important. If you cannot feel pain, heat, or cold, you have a greater risk of damage to the area. Move your fingers often to reduce stiffness and swelling.   General instructions  Take over-the-counter and prescription medicines only as told by your doctor.  Rest your wrist from any activity that may cause pain. If needed, talk with your boss at work about changes that can help your wrist heal.  Do exercises as told by your doctor, physical therapist, or occupational therapist.  Keep all follow-up visits. Contact a doctor if:  You have new symptoms.  Medicine does not help your pain.  Your symptoms get worse. Get help right away if:  You have very bad numbness or tingling in your wrist or hand. Summary  Carpal tunnel syndrome is a condition that causes pain in your hand and arm.  It is often caused by repeated wrist motions.  Lifestyle changes and medicines are used to treat this problem. Surgery may help in very bad cases.  Follow your doctor's instructions about wearing a splint, resting your wrist, keeping follow-up visits, and calling for help. This information is not intended to replace advice given to you by your health care provider. Make sure you discuss any questions you have with your health care provider. Document Revised: 03/10/2020 Document Reviewed: 03/10/2020 Elsevier Patient Education  [place].

## 2021-01-27 NOTE — Telephone Encounter (Signed)
FYI pt called back to inform Dr Jaynee Eagles she was able to find a splint for her hand, no call back needed.

## 2021-01-30 ENCOUNTER — Ambulatory Visit (INDEPENDENT_AMBULATORY_CARE_PROVIDER_SITE_OTHER): Payer: Medicare HMO | Admitting: Neurology

## 2021-01-30 ENCOUNTER — Telehealth: Payer: Self-pay | Admitting: *Deleted

## 2021-01-30 ENCOUNTER — Ambulatory Visit: Payer: Medicare HMO | Admitting: Neurology

## 2021-01-30 DIAGNOSIS — M79641 Pain in right hand: Secondary | ICD-10-CM

## 2021-01-30 DIAGNOSIS — G5603 Carpal tunnel syndrome, bilateral upper limbs: Secondary | ICD-10-CM

## 2021-01-30 DIAGNOSIS — M79642 Pain in left hand: Secondary | ICD-10-CM

## 2021-01-30 DIAGNOSIS — Z0289 Encounter for other administrative examinations: Secondary | ICD-10-CM

## 2021-01-30 NOTE — Progress Notes (Signed)
See procedure note.

## 2021-01-30 NOTE — Telephone Encounter (Signed)
Pt has called back and will be able to check in at 12:45 for the 1:15 appointment for the NCS then EMG, this is FYI no call back requested

## 2021-01-30 NOTE — Telephone Encounter (Signed)
Per Dr Jaynee Eagles, I called the pt and offered a sooner EMG/NCS for this afternoon 1:15 pm arrival 12:45 pm. Pt will check with her soon and call us back. If she is unable to come, we will keep the existing 3/31 appointment.

## 2021-01-30 NOTE — Progress Notes (Signed)
Full Name: [name] Gender: Female MRN #: [id number] Date of Birth: Sep 26, 1941    Visit Date: 01/30/2021 13:14 Age: 80 Years Examining Physician: Sarina Ill, MD  Requesting Provider: Leeanne Rio, MD Primary Care Provider:  Alcus Dad, MD    History: Hand Pain bilaterally in a median nerve distribution Summary: NCS performed on bilateral upper extremities. EMG performed on the left upper extremity. The right median APB motor nerve showed prolonged distal onset latency (7.5 ms, N<4.4) and reduced amplitude(1.8 mV, N>4) and decreased conduction velocity (36 m/s, N> 49). The left  median APB motor nerve showed prolonged distal onset latency (11.4 ms, N<4.4) and reduced amplitude(2.7 mV, N>4) and decreased conduction velocity (33 m/s, N> 49).  The right ulnar ADM motor nerve showed reduced amplitude (5.9 mV, N>6) and decreased conduction velocity (44 m/s,N>49).  The right median, left median, right ulnar and left ulnar orthodromic sensory nerve showed no response.  Bilateral radial sensory nerves were within normal limits.F Wave studies indicate that the right Ulnar F wave has delayed latency(36.34ms, N<3ms).The right right median/radial (dig I) comparison nerve showed abnormal peak latency difference (1.7 ms, N<0.5) with a relative median delay. The left right median/radial (dig I) comparison nerve showed abnormal peak latency difference (4.3 ms, N<0.5) with a relative median delay. All remaining nerves (as indicated in the following tables) were within normal limits.  The left opponens pollicis and first dorsal interossei muscles showed decreased insertional activity and diminished motor unit recruitment.All remaining muscles (as indicated in the following tables) were within normal limits.       Conclusion: 1. There is electrophysiologic evidence of bilateral moderately-severe  left > right Carpal Tunnel Syndrome.  2. There may also be concomitant mild ulnar neuropathy however  this does not appear clinically relevant to her current hand pain.   ------------------------------- Sarina Ill , M.D.  [facility] [address], [place], [place] [address] Tel: [phone] Fax: [phone]  Verbal informed consent was obtained from the patient, patient was informed of potential risk of procedure, including bruising, bleeding, hematoma formation, infection, muscle weakness, muscle pain, numbness, among others.        [place]    Nerve / Sites Muscle Latency Ref. Amplitude Ref. Rel Amp Segments Distance Velocity Ref. Area    ms ms mV mV %  cm m/s m/s mVms  R Median - APB     Wrist APB 7.5 ?4.4 1.8 ?4.0 100 Wrist - APB 7   7.3     Upper arm APB 13.5  1.0  57.8 Upper arm - Wrist 22 36 ?49 5.3  L Median - APB     Wrist APB 11.4 ?4.4 2.7 ?4.0 100 Wrist - APB 7   9.8     Upper arm APB 18.0  2.4  91.4 Upper arm - Wrist 22 33 ?49 10.6  R Ulnar - ADM     Wrist ADM 3.3 ?3.3 5.9 ?6.0 100 Wrist - ADM 7   21.0     B.Elbow ADM 7.6  6.0  101 B.Elbow - Wrist 19 44 ?49 21.2     A.Elbow ADM 9.4  4.3  72.8 A.Elbow - B.Elbow 8 44 ?49 17.5           SNC    Nerve / Sites Rec. Site Peak Lat Ref.  Amp Ref. Segments Distance Peak Diff Ref.    ms ms V V  cm ms ms  R Radial - Anatomical snuff  box (Forearm)     Forearm Wrist 2.5 ?2.9 22 ?15 Forearm - Wrist 10    L Radial - Anatomical snuff box (Forearm)     Forearm Wrist 2.7 ?2.9 34 ?15 Forearm - Wrist 10    R Median, Radial - Thumb comparison     Median Wrist Thumb 5.9  8  Median Wrist - Thumb 10       Radial Wrist Thumb 4.2  11  Radial Wrist - Thumb 10          Median Wrist - Radial Wrist  1.7 ?0.5  L Median, Radial - Thumb comparison     Median Wrist Thumb 6.7  9  Median Wrist - Thumb 10       Radial Wrist Thumb 2.3  15  Radial Wrist - Thumb 10          Median Wrist - Radial Wrist  4.3 ?0.5  R Median - Orthodromic (Dig II, Mid palm)     Dig II Wrist NR ?3.4 NR ?10 Dig II - Wrist 13    L Median -  Orthodromic (Dig II, Mid palm)     Dig II Wrist NR ?3.4 NR ?10 Dig II - Wrist 13    R Ulnar - Orthodromic, (Dig V, Mid palm)     Dig V Wrist NR ?3.1 NR ?5 Dig V - Wrist 11    L Ulnar - Orthodromic, (Dig V, Mid palm)     Dig V Wrist NR ?3.1 NR ?5 Dig V - Wrist 30                       F  Wave    Nerve F Lat Ref.   ms ms  R Ulnar - ADM 36.6 ?32.0       EMG Summary Table    Spontaneous MUAP Recruitment  Muscle IA Fib PSW Fasc Other Amp Dur. Poly Pattern  L. Deltoid Normal None None None _______ Normal Normal Normal Normal  L. Triceps brachii Normal None None None _______ Normal Normal Normal Normal  L. Pronator teres Normal None None None _______ Normal Normal Normal Normal  L. First dorsal interosseous decreased None None None _______ Normal Normal Normal reduced  L. Opponens pollicis decreased None None None _______ Normal Normal Normal reduced

## 2021-01-30 NOTE — Telephone Encounter (Signed)
Noted thanks °

## 2021-01-31 ENCOUNTER — Other Ambulatory Visit: Payer: Self-pay | Admitting: Family Medicine

## 2021-01-31 NOTE — Procedures (Signed)
Full Name: [name] Gender: Female MRN #: [id number] Date of Birth: 1941-01-09    Visit Date: 01/30/2021 13:14 Age: 80 Years Examining Physician: Sarina Ill, MD  Requesting Provider: Leeanne Rio, MD Primary Care Provider:  Alcus Dad, MD    History: Hand Pain bilaterally in a median nerve distribution Summary: NCS performed on bilateral upper extremities. EMG performed on the left upper extremity. The right median APB motor nerve showed prolonged distal onset latency (7.5 ms, N<4.4) and reduced amplitude(1.8 mV, N>4) and decreased conduction velocity (36 m/s, N> 49). The left  median APB motor nerve showed prolonged distal onset latency (11.4 ms, N<4.4) and reduced amplitude(2.7 mV, N>4) and decreased conduction velocity (33 m/s, N> 49).  The right ulnar ADM motor nerve showed reduced amplitude (5.9 mV, N>6) and decreased conduction velocity (44 m/s,N>49).  The right median, left median, right ulnar and left ulnar orthodromic sensory nerve showed no response.  Bilateral radial sensory nerves were within normal limits.F Wave studies indicate that the right Ulnar F wave has delayed latency(36.49ms, N<64ms).The right right median/radial (dig I) comparison nerve showed abnormal peak latency difference (1.7 ms, N<0.5) with a relative median delay. The left right median/radial (dig I) comparison nerve showed abnormal peak latency difference (4.3 ms, N<0.5) with a relative median delay. All remaining nerves (as indicated in the following tables) were within normal limits.  The left opponens pollicis and first dorsal interossei muscles showed decreased insertional activity and diminished motor unit recruitment.All remaining muscles (as indicated in the following tables) were within normal limits.       Conclusion: 1. There is electrophysiologic evidence of bilateral moderately-severe  left > right Carpal Tunnel Syndrome.  2. There may also be concomitant mild ulnar neuropathy however  this does not appear clinically relevant to her current hand pain.   ------------------------------- Sarina Ill , M.D.  [facility] [address] [place], [place] [address] Tel: [phone] Fax: [phone]  Verbal informed consent was obtained from the patient, patient was informed of potential risk of procedure, including bruising, bleeding, hematoma formation, infection, muscle weakness, muscle pain, numbness, among others.        [place]    Nerve / Sites Muscle Latency Ref. Amplitude Ref. Rel Amp Segments Distance Velocity Ref. Area    ms ms mV mV %  cm m/s m/s mVms  R Median - APB     Wrist APB 7.5 ?4.4 1.8 ?4.0 100 Wrist - APB 7   7.3     Upper arm APB 13.5  1.0  57.8 Upper arm - Wrist 22 36 ?49 5.3  L Median - APB     Wrist APB 11.4 ?4.4 2.7 ?4.0 100 Wrist - APB 7   9.8     Upper arm APB 18.0  2.4  91.4 Upper arm - Wrist 22 33 ?49 10.6  R Ulnar - ADM     Wrist ADM 3.3 ?3.3 5.9 ?6.0 100 Wrist - ADM 7   21.0     B.Elbow ADM 7.6  6.0  101 B.Elbow - Wrist 19 44 ?49 21.2     A.Elbow ADM 9.4  4.3  72.8 A.Elbow - B.Elbow 8 44 ?49 17.5           SNC    Nerve / Sites Rec. Site Peak Lat Ref.  Amp Ref. Segments Distance Peak Diff Ref.    ms ms V V  cm ms ms  R Radial - Anatomical snuff  box (Forearm)     Forearm Wrist 2.5 ?2.9 22 ?15 Forearm - Wrist 10    L Radial - Anatomical snuff box (Forearm)     Forearm Wrist 2.7 ?2.9 34 ?15 Forearm - Wrist 10    R Median, Radial - Thumb comparison     Median Wrist Thumb 5.9  8  Median Wrist - Thumb 10       Radial Wrist Thumb 4.2  11  Radial Wrist - Thumb 10          Median Wrist - Radial Wrist  1.7 ?0.5  L Median, Radial - Thumb comparison     Median Wrist Thumb 6.7  9  Median Wrist - Thumb 10       Radial Wrist Thumb 2.3  15  Radial Wrist - Thumb 10          Median Wrist - Radial Wrist  4.3 ?0.5  R Median - Orthodromic (Dig II, Mid palm)     Dig II Wrist NR ?3.4 NR ?10 Dig II - Wrist 13    L Median -  Orthodromic (Dig II, Mid palm)     Dig II Wrist NR ?3.4 NR ?10 Dig II - Wrist 13    R Ulnar - Orthodromic, (Dig V, Mid palm)     Dig V Wrist NR ?3.1 NR ?5 Dig V - Wrist 11    L Ulnar - Orthodromic, (Dig V, Mid palm)     Dig V Wrist NR ?3.1 NR ?5 Dig V - Wrist 23                       F  Wave    Nerve F Lat Ref.   ms ms  R Ulnar - ADM 36.6 ?32.0       EMG Summary Table    Spontaneous MUAP Recruitment  Muscle IA Fib PSW Fasc Other Amp Dur. Poly Pattern  L. Deltoid Normal None None None _______ Normal Normal Normal Normal  L. Triceps brachii Normal None None None _______ Normal Normal Normal Normal  L. Pronator teres Normal None None None _______ Normal Normal Normal Normal  L. First dorsal interosseous decreased None None None _______ Normal Normal Normal reduced  L. Opponens pollicis decreased None None None _______ Normal Normal Normal reduced

## 2021-02-03 DIAGNOSIS — E1122 Type 2 diabetes mellitus with diabetic chronic kidney disease: Secondary | ICD-10-CM | POA: Diagnosis not present

## 2021-02-03 DIAGNOSIS — G8220 Paraplegia, unspecified: Secondary | ICD-10-CM | POA: Diagnosis not present

## 2021-02-03 DIAGNOSIS — R69 Illness, unspecified: Secondary | ICD-10-CM | POA: Diagnosis not present

## 2021-02-03 DIAGNOSIS — I4891 Unspecified atrial fibrillation: Secondary | ICD-10-CM | POA: Diagnosis not present

## 2021-02-03 DIAGNOSIS — D6869 Other thrombophilia: Secondary | ICD-10-CM | POA: Diagnosis not present

## 2021-02-03 DIAGNOSIS — E261 Secondary hyperaldosteronism: Secondary | ICD-10-CM | POA: Diagnosis not present

## 2021-02-03 DIAGNOSIS — I130 Hypertensive heart and chronic kidney disease with heart failure and stage 1 through stage 4 chronic kidney disease, or unspecified chronic kidney disease: Secondary | ICD-10-CM | POA: Diagnosis not present

## 2021-02-03 DIAGNOSIS — E113299 Type 2 diabetes mellitus with mild nonproliferative diabetic retinopathy without macular edema, unspecified eye: Secondary | ICD-10-CM | POA: Diagnosis not present

## 2021-02-03 DIAGNOSIS — I509 Heart failure, unspecified: Secondary | ICD-10-CM | POA: Diagnosis not present

## 2021-02-03 DIAGNOSIS — I69365 Other paralytic syndrome following cerebral infarction, bilateral: Secondary | ICD-10-CM | POA: Diagnosis not present

## 2021-02-09 ENCOUNTER — Encounter: Payer: Medicare HMO | Admitting: Neurology

## 2021-02-13 DIAGNOSIS — G5603 Carpal tunnel syndrome, bilateral upper limbs: Secondary | ICD-10-CM | POA: Insufficient documentation

## 2021-02-13 DIAGNOSIS — M7989 Other specified soft tissue disorders: Secondary | ICD-10-CM | POA: Diagnosis not present

## 2021-02-13 HISTORY — DX: Carpal tunnel syndrome, bilateral upper limbs: G56.03

## 2021-02-17 ENCOUNTER — Other Ambulatory Visit: Payer: Self-pay | Admitting: Orthopedic Surgery

## 2021-02-20 ENCOUNTER — Other Ambulatory Visit: Payer: Self-pay

## 2021-02-20 ENCOUNTER — Encounter (INDEPENDENT_AMBULATORY_CARE_PROVIDER_SITE_OTHER): Payer: Self-pay | Admitting: Ophthalmology

## 2021-02-20 ENCOUNTER — Ambulatory Visit (INDEPENDENT_AMBULATORY_CARE_PROVIDER_SITE_OTHER): Payer: Medicare HMO | Admitting: Ophthalmology

## 2021-02-20 DIAGNOSIS — H353211 Exudative age-related macular degeneration, right eye, with active choroidal neovascularization: Secondary | ICD-10-CM | POA: Diagnosis not present

## 2021-02-20 DIAGNOSIS — E113491 Type 2 diabetes mellitus with severe nonproliferative diabetic retinopathy without macular edema, right eye: Secondary | ICD-10-CM | POA: Diagnosis not present

## 2021-02-20 MED ORDER — BEVACIZUMAB 2.5 MG/0.1ML IZ SOSY
2.5000 mg | PREFILLED_SYRINGE | INTRAVITREAL | Status: AC | PRN
  Administered 2021-02-20: 2.5 mg via INTRAVITREAL

## 2021-02-20 NOTE — Assessment & Plan Note (Signed)
Stabilized on intravitreal Avastin will need peripheral PRP inferiorly soon

## 2021-02-20 NOTE — Progress Notes (Signed)
02/20/2021     CHIEF COMPLAINT Patient presents for Retina Follow Up (7 week f/u possible Avastin OD//Pt states, " I am having more trouble seeing the tv. I have had some sickeness and about to have surgery.")   HISTORY OF PRESENT ILLNESS: [name] is a 80 y.o. female who presents to the clinic today for:   HPI    Retina Follow Up    Patient presents with  Wet AMD.  In right eye.  This started 7 weeks ago.  Duration of 7 weeks.  Since onset it is gradually worsening. Additional comments: 7 week f/u possible Avastin OD  Pt states, " I am having more trouble seeing the tv. I have had some sickeness and about to have surgery."       Last edited by [name], COA on 02/20/2021  9:33 AM. (History)      Referring physician: [name], [name],  [name] [id number]  HISTORICAL INFORMATION:   Selected notes from the MEDICAL RECORD NUMBER    Lab Results  Component Value Date   HGBA1C 6.6 (A) 01/17/2021     CURRENT MEDICATIONS: No current outpatient medications on file. (Ophthalmic Drugs)   No current facility-administered medications for this visit. (Ophthalmic Drugs)   Current Outpatient Medications (Other)  Medication Sig  . acetaminophen (TYLENOL 8 HOUR) 650 MG CR tablet Take 1 tablet (650 mg total) by mouth every 8 (eight) hours as needed for pain.  Marland Kitchen apixaban (ELIQUIS) 5 MG TABS tablet Take 1 tablet (5 mg total) by mouth 2 (two) times daily.  Marland Kitchen atorvastatin (LIPITOR) 40 MG tablet TAKE 1 TABLET BY MOUTH  DAILY AT 6 PM.  . Blood Glucose Monitoring Suppl (ONE TOUCH ULTRA 2) w/Device KIT USE TO CHECK BLOOD SUGAR UPTO 3 TIMES A DAY  . carvedilol (COREG) 6.25 MG tablet TAKE 1 TABLET BY MOUTH  TWICE DAILY WITH A MEAL  . cholecalciferol (VITAMIN D) 25 MCG (1000 UNIT) tablet Take 1 tablet (1,000 Units total) by mouth daily.  . empagliflozin (JARDIANCE) 10 MG TABS tablet Take 1 tablet (10 mg total) by mouth daily before breakfast.  . ferrous sulfate  325 (65 FE) MG tablet TAKE 1 TABLET BY MOUTH EVERY DAY WITH BREAKFAST  . glucose blood (ONE TOUCH ULTRA TEST) test strip Use three times a day  . Lancets (ONETOUCH DELICA PLUS [id number]) MISC 1 application by Does not apply route daily. Please use to check blood sugar up to three times daily.  . nitroGLYCERIN (NITROSTAT) 0.4 MG SL tablet Place 1 tablet (0.4 mg total) under the tongue every 5 (five) minutes as needed for chest pain (up to 3 doses).  . potassium chloride SA (KLOR-CON) 20 MEQ tablet Take 3 tablets (60 mEq total) by mouth daily.  Marland Kitchen rOPINIRole (REQUIP) 0.5 MG tablet TAKE 2 TABLETS BY MOUTH AT  BEDTIME (Patient not taking: Reported on 01/27/2021)  . torsemide (DEMADEX) 20 MG tablet Take 2 tablets (40 mg total) by mouth 2 (two) times daily.  . vitamin B-12 (CYANOCOBALAMIN) 1000 MCG tablet Take 1 tablet (1,000 mcg total) by mouth daily.   No current facility-administered medications for this visit. (Other)      REVIEW OF SYSTEMS:    ALLERGIES Allergies  Allergen Reactions  . Benazepril Other (See Comments)    Unknown reaction at age 27-65 - possibly dizziness  . Ozempic (0.25 Or 0.5 Mg-Dose) [Semaglutide(0.25 Or 0.70m-Dos)] Diarrhea    Gi intollerance  . Angiotensin Receptor  Blockers Other (See Comments)    Hypotension reaction  . Fe-Succ-C-Thre-B12-Des Stomach Other (See Comments)    Unknown reaction  . Sulfamethoxazole-Trimethoprim Other (See Comments)    Unknown reaction    PAST MEDICAL HISTORY Past Medical History:  Diagnosis Date  . ACC/AHA stage C congestive heart failure due to ischemic cardiomyopathy ([place]) 01/31/2009   Qualifier: Diagnosis of  By: Olevia Perches, MD, Glenetta Hew   . ANXIETY 01/31/2009   Qualifier: Diagnosis of  By: Lovette Cliche, CNA, Christy    . Arthritis    "qwhere" (03/30/2016)  . Benign essential HTN   . CAD (coronary artery disease) 05/2008   a. s/p CABG in 2009.  Marland Kitchen Cerebrovascular accident (CVA) due to embolism of right middle cerebral artery  ([place])   . Chronic anticoagulation   . Chronic kidney disease (CKD), stage III (moderate) (HCC)   . Chronic lower back pain   . Chronic pain syndrome   . Chronic systolic CHF (congestive heart failure) ([place])   . DM type 2 with diabetic peripheral neuropathy ([place])   . Facial weakness, post-stroke   . Gastrointestinal hemorrhage 03/26/2017  . Hemiparesis ([place])   . History of CVA with residual deficit 03/26/2017  . Hyperlipidemia   . Hypertension   . Ischemic cardiomyopathy   . Longstanding persistent atrial fibrillation ([place])    a. Postoperative in 2009;  S/P transesophageal echocardiography-guided cardioversion, previously on Amiodarone and Coumadin. b. recurrent in April 2013 -> pt elected rate control strategy, initially Coumadin -> had stroke with subtherapeutic INR in 03/2016 and transitioned to Eliquis.  . Macular degeneration   . Morbid obesity ([place]) 03/26/2017  . OSA on CPAP    severe OSA with AHI 34/hr  . Persistent atrial fibrillation ([place])   . Thrombocytopenia ([place])    Past Surgical History:  Procedure Laterality Date  . APPENDECTOMY  1946  . CATARACT EXTRACTION Right 2012  . COLONOSCOPY WITH PROPOFOL Left 03/27/2017   Procedure: COLONOSCOPY WITH PROPOFOL;  Surgeon: Ronnette Juniper, MD;  Location: [facility];  Service: Gastroenterology;  Laterality: Left;  . CORONARY ANGIOPLASTY WITH STENT PLACEMENT  2009   "put 2 stents in"  . HERNIA REPAIR    . IR LUMBAR [place] W/IMG GUIDE  10/08/2018  . JOINT REPLACEMENT    . TEE WITH CARDIOVERSION     Postoperative in 2009;  S/P transesophageal echocardiography-guided cardioversion,  . TOTAL KNEE ARTHROPLASTY Right 1994  . VENTRAL HERNIA REPAIR  10/1999   With mesh    FAMILY HISTORY Family History  Problem Relation Age of Onset  . Clotting disorder Mother        Cerebral hemorrhage  . Other Mother        cerebral hemorrhage  . Emphysema Father        COD  . Lung cancer Brother   . Heart disease Neg Hx     SOCIAL  HISTORY Social History   Tobacco Use  . Smoking status: Never Smoker  . Smokeless tobacco: Never Used  Vaping Use  . Vaping Use: Never used  Substance Use Topics  . Alcohol use: No  . Drug use: Never         OPHTHALMIC EXAM: Base Eye Exam    Visual Acuity (ETDRS)      Right Left   Dist cc 20/40 +2 20/30 +1   Dist ph cc NI NI   Correction: Glasses       Tonometry (Tonopen, 9:37 AM)      Right Left  Pressure 07 05       Pupils      Pupils Dark Light Shape React APD   Right PERRL 3 2 Round Brisk None   Left PERRL 3 2 Round Brisk None       Visual Fields (Counting fingers)      Left Right    Full Full       Extraocular Movement      Right Left    Full Full       Neuro/Psych    Oriented x3: Yes   Mood/Affect: Normal       Dilation    Right eye: 1.0% Mydriacyl, 2.5% Phenylephrine @ 9:37 AM        Slit Lamp and Fundus Exam    External Exam      Right Left   External Normal Normal       Slit Lamp Exam      Right Left   Lids/Lashes Normal Normal   Conjunctiva/Sclera White and quiet White and quiet   Cornea Clear Clear   Anterior Chamber Deep and quiet Deep and quiet   Iris Round and reactive Round and reactive   Lens Posterior chamber intraocular lens Posterior chamber intraocular lens   Anterior Vitreous Normal Normal       Fundus Exam      Right Left   Posterior Vitreous Posterior vitreous detachment Partial posterior vitreous detachment   Disc Normal Normal   C/D Ratio 0.7 0.7   Macula Soft drusen, Retinal pigment epithelial mottling, no macular thickening, geographic atrophy superior portion of the macula Intermediate age related macular degeneration, Hard drusen, Pigmented atrophy   Vessels NPDR-Severe, with arm inferiorly NPDR-Severe   Periphery Normal Normal          IMAGING AND PROCEDURES  Imaging and Procedures for 02/20/21  OCT, Retina - OU - Both Eyes       Right Eye Quality was good. Scan locations included subfoveal.  Central Foveal Thickness: 222. Progression has improved. Findings include abnormal foveal contour.   Left Eye Quality was good. Scan locations included subfoveal. Central Foveal Thickness: 296. Progression has been stable. Findings include abnormal foveal contour, vitreous traction, vitreomacular adhesion .   Notes OD with less subretinal fluid inferior to the fovea from wet ARMD on OCT.  At 7-week interval.  We will repeat injection Avastin OD today  OS with persistent vitreal foveal traction of the inner retina With  elevation and secondary inner retinal foveomacular schisis yet with good acuity we will continue to monitor and observe   OD also with known severe NPDR controlled on intravitreal Avastin, now 7-week interval       Intravitreal Injection, Pharmacologic Agent - OD - Right Eye       Time Out 02/20/2021. 10:21 AM. Confirmed correct patient, procedure, site, and patient consented.   Anesthesia Topical anesthesia was used. Anesthetic medications included [place]ten 3.5%.   Procedure Preparation included Ofloxacin , Tobramycin 0.3%, 10% betadine to eyelids, 5% betadine to ocular surface. A 30 gauge needle was used.   Injection:  2.5 mg Bevacizumab (AVASTIN) 2.41m/0.1mL SOSY   NDC:: 50932-671-24 Lot:: [id number]  Route: Intravitreal, Site: Right Eye  Post-op Post injection exam found visual acuity of at least counting fingers. The patient tolerated the procedure well. There were no complications. The patient received written and verbal post procedure care education. Post injection medications were not given.  ASSESSMENT/PLAN:  Exudative age-related macular degeneration of right eye with active choroidal neovascularization (HCC) Overall stable and improved wet AMD, at 7-week interval.  We will repeat today.  Concomitant benefit by progression of NPDR severe resting on current current therapy for wet AMD  Severe nonproliferative diabetic retinopathy of  right eye (HCC) Stabilized on intravitreal Avastin will need peripheral PRP inferiorly soon      ICD-10-CM   1. Exudative age-related macular degeneration of right eye with active choroidal neovascularization (HCC)  H35.3211 OCT, Retina - OU - Both Eyes    Intravitreal Injection, Pharmacologic Agent - OD - Right Eye    bevacizumab (AVASTIN) SOSY 2.5 mg  2. Severe nonproliferative diabetic retinopathy of right eye without macular edema associated with type 2 diabetes mellitus (HCC)  E36.6294 Intravitreal Injection, Pharmacologic Agent - OD - Right Eye    bevacizumab (AVASTIN) SOSY 2.5 mg    1.  Improved macular condition OD and stable acuity after successful treatment of wet ARMD.  We will repeat injection today to maintain and also to delay progression of severe NPDR  2.  Referral examination severe NPDR will need to peripheral PRP in the future to prevent progression and stabilize so as to minimize treatment burden  3.  Late OU next, consider PRP OD next  Ophthalmic Meds Ordered this visit:  Meds ordered this encounter  Medications  . bevacizumab (AVASTIN) SOSY 2.5 mg       Return in about 8 weeks (around 04/17/2021), or ,, And early a.m., for DILATE OU, OD, OCT, PRP.  There are no Patient Instructions on file for this visit.   Explained the diagnoses, plan, and follow up with the patient and they expressed understanding.  Patient expressed understanding of the importance of proper follow up care.   Clent Demark Jeraldean Wechter M.D. Diseases & Surgery of the Retina and Vitreous [facility]ina [facility] Diabetic [place] 02/20/21     Abbreviations: M myopia (nearsighted); A astigmatism; H hyperopia (farsighted); P presbyopia; Mrx spectacle prescription;  CTL contact lenses; OD right eye; OS left eye; OU both eyes  XT exotropia; ET esotropia; PEK punctate epithelial keratitis; PEE punctate epithelial erosions; DES dry eye syndrome; MGD meibomian gland dysfunction; ATs artificial tears; PFAT's preservative  free artificial tears; [place] nuclear sclerotic cataract; PSC posterior subcapsular cataract; ERM epi-retinal membrane; PVD posterior vitreous detachment; RD retinal detachment; DM diabetes mellitus; DR diabetic retinopathy; NPDR non-proliferative diabetic retinopathy; PDR proliferative diabetic retinopathy; CSME clinically significant macular edema; DME diabetic macular edema; dbh dot blot hemorrhages; CWS cotton wool spot; POAG primary open angle glaucoma; C/D cup-to-disc ratio; HVF humphrey visual field; GVF goldmann visual field; OCT optical coherence tomography; IOP intraocular pressure; BRVO Branch retinal vein occlusion; CRVO central retinal vein occlusion; CRAO central retinal artery occlusion; BRAO branch retinal artery occlusion; RT retinal tear; SB scleral buckle; PPV pars plana vitrectomy; VH Vitreous hemorrhage; PRP panretinal laser photocoagulation; IVK intravitreal kenalog; VMT vitreomacular traction; MH Macular hole;  NVD neovascularization of the disc; NVE neovascularization elsewhere; AREDS age related eye disease study; ARMD age related macular degeneration; POAG primary open angle glaucoma; EBMD epithelial/anterior basement membrane dystrophy; ACIOL anterior chamber intraocular lens; IOL intraocular lens; PCIOL posterior chamber intraocular lens; Phaco/IOL phacoemulsification with intraocular lens placement; [place] photorefractive keratectomy; LASIK laser assisted in situ keratomileusis; HTN hypertension; DM diabetes mellitus; COPD chronic obstructive pulmonary disease

## 2021-02-20 NOTE — Assessment & Plan Note (Signed)
Overall stable and improved wet AMD, at 7-week interval.  We will repeat today.  Concomitant benefit by progression of NPDR severe resting on current current therapy for wet AMD

## 2021-02-28 ENCOUNTER — Other Ambulatory Visit (HOSPITAL_COMMUNITY): Payer: Self-pay | Admitting: Cardiology

## 2021-02-28 ENCOUNTER — Other Ambulatory Visit: Payer: Self-pay

## 2021-02-28 ENCOUNTER — Ambulatory Visit (HOSPITAL_BASED_OUTPATIENT_CLINIC_OR_DEPARTMENT_OTHER)
Admission: RE | Admit: 2021-02-28 | Discharge: 2021-02-28 | Disposition: A | Discharge status: Home / Self Care | Payer: Medicare HMO | Source: Ambulatory Visit

## 2021-02-28 ENCOUNTER — Encounter (HOSPITAL_COMMUNITY): Payer: Self-pay | Admitting: Cardiology

## 2021-02-28 ENCOUNTER — Ambulatory Visit (HOSPITAL_COMMUNITY)
Admission: RE | Admit: 2021-02-28 | Discharge: 2021-02-28 | Disposition: A | Discharge status: Home / Self Care | Payer: Medicare HMO | Source: Ambulatory Visit | Attending: Cardiology | Admitting: Cardiology

## 2021-02-28 VITALS — BP 110/70 | HR 78 | Wt 215.0 lb

## 2021-02-28 DIAGNOSIS — E785 Hyperlipidemia, unspecified: Secondary | ICD-10-CM | POA: Diagnosis not present

## 2021-02-28 DIAGNOSIS — Z7901 Long term (current) use of anticoagulants: Secondary | ICD-10-CM | POA: Insufficient documentation

## 2021-02-28 DIAGNOSIS — I482 Chronic atrial fibrillation, unspecified: Secondary | ICD-10-CM | POA: Diagnosis not present

## 2021-02-28 DIAGNOSIS — E1122 Type 2 diabetes mellitus with diabetic chronic kidney disease: Secondary | ICD-10-CM | POA: Insufficient documentation

## 2021-02-28 DIAGNOSIS — G4733 Obstructive sleep apnea (adult) (pediatric): Secondary | ICD-10-CM | POA: Diagnosis not present

## 2021-02-28 DIAGNOSIS — Z881 Allergy status to other antibiotic agents status: Secondary | ICD-10-CM | POA: Insufficient documentation

## 2021-02-28 DIAGNOSIS — M545 Low back pain, unspecified: Secondary | ICD-10-CM | POA: Diagnosis not present

## 2021-02-28 DIAGNOSIS — I4819 Other persistent atrial fibrillation: Secondary | ICD-10-CM | POA: Diagnosis not present

## 2021-02-28 DIAGNOSIS — N183 Chronic kidney disease, stage 3 unspecified: Secondary | ICD-10-CM | POA: Insufficient documentation

## 2021-02-28 DIAGNOSIS — I5022 Chronic systolic (congestive) heart failure: Secondary | ICD-10-CM

## 2021-02-28 DIAGNOSIS — Z6831 Body mass index (BMI) 31.0-31.9, adult: Secondary | ICD-10-CM | POA: Insufficient documentation

## 2021-02-28 DIAGNOSIS — Z79899 Other long term (current) drug therapy: Secondary | ICD-10-CM | POA: Insufficient documentation

## 2021-02-28 DIAGNOSIS — Z7984 Long term (current) use of oral hypoglycemic drugs: Secondary | ICD-10-CM | POA: Insufficient documentation

## 2021-02-28 DIAGNOSIS — I50810 Right heart failure, unspecified: Secondary | ICD-10-CM | POA: Diagnosis not present

## 2021-02-28 DIAGNOSIS — I130 Hypertensive heart and chronic kidney disease with heart failure and stage 1 through stage 4 chronic kidney disease, or unspecified chronic kidney disease: Secondary | ICD-10-CM | POA: Diagnosis not present

## 2021-02-28 DIAGNOSIS — I2510 Atherosclerotic heart disease of native coronary artery without angina pectoris: Secondary | ICD-10-CM | POA: Diagnosis not present

## 2021-02-28 DIAGNOSIS — Z8673 Personal history of transient ischemic attack (TIA), and cerebral infarction without residual deficits: Secondary | ICD-10-CM | POA: Diagnosis not present

## 2021-02-28 DIAGNOSIS — I083 Combined rheumatic disorders of mitral, aortic and tricuspid valves: Secondary | ICD-10-CM | POA: Diagnosis not present

## 2021-02-28 DIAGNOSIS — I272 Pulmonary hypertension, unspecified: Secondary | ICD-10-CM | POA: Diagnosis not present

## 2021-02-28 DIAGNOSIS — Z951 Presence of aortocoronary bypass graft: Secondary | ICD-10-CM | POA: Diagnosis not present

## 2021-02-28 LAB — ECHOCARDIOGRAM COMPLETE: S' Lateral: 4.1 cm

## 2021-02-28 MED ORDER — NITROGLYCERIN 0.4 MG SL SUBL: 0.4000 mg | SUBLINGUAL_TABLET | SUBLINGUAL | 5 refills | Status: DC | PRN

## 2021-02-28 MED ORDER — TORSEMIDE 20 MG PO TABS: 60.0000 mg | ORAL_TABLET | Freq: Two times a day (BID) | ORAL | 0 refills | Status: DC

## 2021-02-28 MED ORDER — SPIRONOLACTONE 25 MG PO TABS: 12.5000 mg | ORAL_TABLET | Freq: Every day | ORAL | 0 refills | Status: DC

## 2021-02-28 MED ORDER — SPIRONOLACTONE 25 MG PO TABS: 12.5000 mg | ORAL_TABLET | Freq: Every day | ORAL | 3 refills | Status: DC

## 2021-02-28 MED ORDER — TORSEMIDE 20 MG PO TABS: 60.0000 mg | ORAL_TABLET | Freq: Two times a day (BID) | ORAL | 3 refills | Status: DC

## 2021-02-28 NOTE — Progress Notes (Signed)
  Echocardiogram 2D Echocardiogram has been performed.  [name] 02/28/2021, 9:53 AM

## 2021-02-28 NOTE — Patient Instructions (Addendum)
No Labs done today.  INCREASE Torsemide 60mg  (3 tablets) by mouth 2 times daily.  START Spironolactone 12.5mg  (1/2 tablet) by mouth daily.   Your refill of Nitroglycerin was sent into the pharmacy.  We gave you a paper prescription for your blood pressure cuff today in office.   No other medication changes were made. Please continue all current medications as prescribed.  Your physician recommends that you schedule a follow-up appointment in: 1 week for a lab only appointment and in 1 month for an appointment with our [facility] here in our office.    If you have any questions or concerns before your next appointment please send [place] a message through [place] or call our office at [phone].    TO LEAVE A MESSAGE FOR THE NURSE SELECT OPTION 2, PLEASE LEAVE A MESSAGE INCLUDING: . YOUR NAME . DATE OF BIRTH . CALL BACK NUMBER . REASON FOR CALL**this is important as we prioritize the call backs  YOU WILL RECEIVE A CALL BACK THE SAME DAY AS LONG AS YOU CALL BEFORE 4:00 PM   Do the following things EVERYDAY: 1) Weigh yourself in the morning before breakfast. Write it down and keep it in a log. 2) Take your medicines as prescribed 3) Eat low salt foods--Limit salt (sodium) to 2000 mg per day.  4) Stay as active as you can everyday 5) Limit all fluids for the day to less than 2 liters   At the [facility], you and your health needs are our priority. As part of our continuing mission to provide you with exceptional heart care, we have created designated Provider Care Teams. These Care Teams include your primary Cardiologist (physician) and Advanced Practice Providers (APPs- Physician Assistants and Nurse Practitioners) who all work together to provide you with the care you need, when you need it.   You may see any of the following providers on your designated Care Team at your next follow up: Marland Kitchen Dr Glori Bickers . Dr Loralie Champagne . Darrick Grinder, NP . Lyda Jester, PA . Audry Riles, PharmD   Please be sure to bring in all your medications bottles to every appointment.

## 2021-02-28 NOTE — Progress Notes (Signed)
ID:  [name], DOB Sep 03, 1941, MRN [id number]   Provider location: [facility] Type of Visit: Established patient   PCP:  Alcus Dad, MD  Cardiologist:  Fransico Him, MD Primary HF: Dr. Aundra Dubin   History of Present Illness: [name] is a 80 y.o. female who has a history of CAD status post CABG 2009, ischemic cardiomyopathy with chronic systolic CHF, persistent atrial fibrillation with stroke in 03/2016 when INR was subtherapeutic, stage III CKD, morbid obesity, pulmonary hypertension, diabetes, GI bleed 03/2017, and severe sleep apnea.   Seen in [facility] clinics 09/10/18 and 09/24/18, both times with volume overload and marked peripheral edema with skin breakdown. Torsemide increased at each visit. Echo in 7/19 showed EF 45-50% with mild RV dilation/mild RV systolic dysfunction and D-shaped interventricular septum.    AKI earlier in 7/20 in setting of excessive torsemide, she had increased dose to 60 qam/40 qpm on her own.  This was cut back to 40 mg daily and losartan and spironolactone were stopped.   Echo in 9/20 showed EF 45% with mildly decreased RV systolic function, PASP 46 mmHg with dilated IVC.  PYP scan in 10/21 was negative.   Echo was done today and reviewed, EF 45% with diffuse hypokinesis, mildly decreased RV systolic function, PASP 47, IVC dilated, moderate TR, mild MR.   She returns today for followup of CHF.  Weight is down 5 lbs.  She uses her walker for balance.  She is short of breath walking out to the car.  Does not walk much.  Limited by low back pain.  No chest pain.  Chronic orthopnea (raises head of bed). Using CPAP regularly.    Labs (12/18): K 3.9, Creatinine 1.65 Labs (5/19): K 4.1, creatinine 1.7 Labs (6/19): hgb 8.2 Labs (8/19): K 4.3, creatinine 1.73 Labs (9/19): K 4, creatinine 1.56 Labs (11/19): K 4.7, creatinine 2.04 Labs (12/19): creatinine 1.44 Labs (7/20): K 5, creatinine 2.49 => 1.86, Hgb 11.1 Labs (8/20):  K 3.9, creatinine 1.79 Labs (9/20): LDL 50 Labs (11/20): K 3.8, creatinine 2.01 Labs (2/21): K 4, creatinine 1.9 Labs (7/21): K 4.2, creatinine 1.84, hgb 11.2 Labs (9/21): myeloma panel negative, urine immunofixation negative, LDL 50, K 4.6, creatinine 1.76 Labs (3/22): hgb 13.4, K 4.3, creatinine 1.86  Review of systems complete and found to be negative unless listed in HPI.    Past Medical History 1. Chronic systolic CHF: Ischemic cardiomyopathy with prominent RV failure.   - Echo (11/18): LVEF 35-40%, severe RV dilation, severe RAE, severe TR.  - Echo (7/19): EF 45-50%, diffuse hypokinesis, mild RV dilation with mildly decreased RV systolic function, D-shaped interventricular septum suggestive of RV pressure/volume overload, mild MR, moderate TR, PASP 49 mmhg.  - Echo (9/20): EF 45%, mild LV dilation, mild RV dilation with mildly decreased systolic function, PASP 46 mmHg, IVC dilated.  - PYP scan (10/21): Negative.  - Echo (4/22): EF 45% with diffuse hypokinesis, mildly decreased RV systolic function, PASP 47, IVC dilated, moderate TR, mild MR. 2. CAD: CABG 2009. No angiography since that time.   3. HTN 4. Atrial fibrillation: Chronic since 2013.  5. CKD stage 3 6. Morbid obesity 7. OSA 8. Chronic venous stasis with RLE wound/Bullae 9. CVA 5/18.   Current Outpatient Medications  Medication Sig Dispense Refill  . acetaminophen (TYLENOL 8 HOUR) 650 MG CR tablet Take 1 tablet (650 mg total) by mouth every 8 (eight) hours as needed for pain. 30 tablet  0  . apixaban (ELIQUIS) 5 MG TABS tablet Take 1 tablet (5 mg total) by mouth 2 (two) times daily. 180 tablet 3  . atorvastatin (LIPITOR) 40 MG tablet TAKE 1 TABLET BY MOUTH  DAILY AT 6 PM. 90 tablet 3  . Blood Glucose Monitoring Suppl (ONE TOUCH ULTRA 2) w/Device KIT USE TO CHECK BLOOD SUGAR UPTO 3 TIMES A DAY 1 kit 0  . carvedilol (COREG) 6.25 MG tablet TAKE 1 TABLET BY MOUTH  TWICE DAILY WITH A MEAL 180 tablet 3  . cholecalciferol  (VITAMIN D) 25 MCG (1000 UNIT) tablet Take 1 tablet (1,000 Units total) by mouth daily. 90 tablet 1  . empagliflozin (JARDIANCE) 10 MG TABS tablet Take 1 tablet (10 mg total) by mouth daily before breakfast. 90 tablet 3  . ferrous sulfate 325 (65 FE) MG tablet TAKE 1 TABLET BY MOUTH EVERY DAY WITH BREAKFAST 90 tablet 1  . glucose blood (ONE TOUCH ULTRA TEST) test strip Use three times a day 100 each 3  . Lancets (ONETOUCH DELICA PLUS [id number]) MISC 1 application by Does not apply route daily. Please use to check blood sugar up to three times daily. 100 each 6  . potassium chloride SA (KLOR-CON) 20 MEQ tablet Take 3 tablets (60 mEq total) by mouth daily. 450 tablet 3  . rOPINIRole (REQUIP) 0.5 MG tablet Take 1 mg by mouth at bedtime.    Marland Kitchen spironolactone (ALDACTONE) 25 MG tablet Take 0.5 tablets (12.5 mg total) by mouth daily. 45 tablet 3  . spironolactone (ALDACTONE) 25 MG tablet Take 0.5 tablets (12.5 mg total) by mouth daily. 15 tablet 0  . torsemide (DEMADEX) 20 MG tablet Take 3 tablets (60 mg total) by mouth 2 (two) times daily. 180 tablet 0  . vitamin B-12 (CYANOCOBALAMIN) 1000 MCG tablet Take 1 tablet (1,000 mcg total) by mouth daily. 90 tablet 1  . nitroGLYCERIN (NITROSTAT) 0.4 MG SL tablet Place 1 tablet (0.4 mg total) under the tongue every 5 (five) minutes as needed for chest pain (up to 3 doses). 15 tablet 5  . torsemide (DEMADEX) 20 MG tablet Take 3 tablets (60 mg total) by mouth 2 (two) times daily. 545 tablet 3   No current facility-administered medications for this encounter.   Allergies  Allergen Reactions  . Benazepril Other (See Comments)    Unknown reaction at age 62-65 - possibly dizziness  . Ozempic (0.25 Or 0.5 Mg-Dose) [Semaglutide(0.25 Or 0.40m-Dos)] Diarrhea    Gi intollerance  . Angiotensin Receptor Blockers Other (See Comments)    Hypotension reaction  . Fe-Succ-C-Thre-B12-Des Stomach Other (See Comments)    Unknown reaction  . Sulfamethoxazole-Trimethoprim Other  (See Comments)    Unknown reaction   Social History   Socioeconomic History  . Marital status: Widowed    Spouse name: Not on file  . Number of children: 2  . Years of education: 155 . Highest education level: Not on file  Occupational History    Comment: retired  Tobacco Use  . Smoking status: Never Smoker  . Smokeless tobacco: Never Used  Vaping Use  . Vaping Use: Never used  Substance and Sexual Activity  . Alcohol use: No  . Drug use: Never  . Sexual activity: Not Currently  Other Topics Concern  . Not on file  Social History Narrative   Widowed   01/27/21 [place]  2 children   Not routinely exercising   Social Determinants of Health   Financial Resource Strain:  Not on file  Food Insecurity: Not on file  Transportation Needs: Not on file  Physical Activity: Not on file  Stress: Not on file  Social Connections: Not on file  Intimate Partner Violence: Not on file   Family History  Problem Relation Age of Onset  . Clotting disorder Mother        Cerebral hemorrhage  . Other Mother        cerebral hemorrhage  . Emphysema Father        COD  . Lung cancer Brother   . Heart disease Neg Hx    Vitals:   02/28/21 1007  BP: 110/70  Pulse: 78  SpO2: 100%  Weight: 97.5 kg (215 lb)     Wt Readings from Last 3 Encounters:  02/28/21 97.5 kg (215 lb)  01/27/21 94.8 kg (209 lb)  01/17/21 95.4 kg (210 lb 4 oz)    Exam:   General: NAD Neck: JVP 8-9 cm, no thyromegaly or thyroid nodule.  Lungs: Clear to auscultation bilaterally with normal respiratory effort. CV: Nondisplaced PMI.  Heart irregular S1/S2, no S3/S4, no murmur.  Trace ankle edema.  No carotid bruit.  Normal pedal pulses.  Abdomen: Soft, nontender, no hepatosplenomegaly, no distention.  Skin: Intact without lesions or rashes.  Neurologic: Alert and oriented x 3.  Psych: Normal affect. Extremities: No clubbing or cyanosis.  HEENT: Normal.    ASSESSMENT & PLAN:  1. Chronic  systolic CHF with prominent RV failure: Ischemic cardiomyopathy.  Echo in 7/19 showed LV EF 45-50% (improved) with mildly dilated/mildly dysfunction RV.  D-shaped septum suggested RV pressure/volume overload.  Echo in 9/20 showed EF 45%, mildly dysfunctional RV, PASP 46 mmHg with dilated IVC.  PYP scan negative and myeloma workup negative, no evidence for cardiac amyloidosis.  Echo today showed EF 45% with diffuse hypokinesis, mildly decreased RV systolic function, PASP 47, IVC dilated, moderate TR, mild MR.  Weight is down 5 lbs, but she looks volume overloaded by exam and IVC is dilated on today's echo.  NYHA class III symptoms.  - Continue Jardiance 10 mg daily.  - Increase torsemide to 60 mg bid.  BMET in 1 week.   - Continue Coreg 6.25 mg bid.   - Add spironolactone 12.5 mg daily.   - We have discussed Cardiomems but she has not been admitted in the last year so cannot get.  2. CAD s/p CABG: No chest pain.  - Continue atorvastatin 40 mg daily. Good lipids in 9/21.     - No ASA with stable CAD on Eliquis.  3. Atrial fibrillation: Chronic. Given long-term atrial fibrillation, she is unlikely to successfully cardiovert.  - Continue Eliquis.   4. CKD stage 3: BMET today.   5. H/o CVA: Continue Eliquis.   6. OSA: She is using CPAP.  7. HTN: BP controlled.    Followup APP 1 month.    Signed, Loralie Champagne, MD  02/28/2021  [place] [address] [facility] [place] [address] [phone] (office) [phone] (fax)

## 2021-03-07 ENCOUNTER — Other Ambulatory Visit: Payer: Self-pay

## 2021-03-07 ENCOUNTER — Ambulatory Visit (HOSPITAL_COMMUNITY)
Admission: RE | Admit: 2021-03-07 | Discharge: 2021-03-07 | Disposition: A | Discharge status: Home / Self Care | Payer: Medicare HMO | Source: Ambulatory Visit | Attending: Cardiology | Admitting: Cardiology

## 2021-03-07 DIAGNOSIS — I5022 Chronic systolic (congestive) heart failure: Secondary | ICD-10-CM | POA: Insufficient documentation

## 2021-03-07 LAB — BASIC METABOLIC PANEL
Anion gap: 11 (ref 5–15)
BUN: 64 mg/dL — ABNORMAL HIGH (ref 8–23)
CO2: 27 mmol/L (ref 22–32)
Calcium: 10.2 mg/dL (ref 8.9–10.3)
Chloride: 98 mmol/L (ref 98–111)
Creatinine, Ser: 1.74 mg/dL — ABNORMAL HIGH (ref 0.44–1.00)
GFR, Estimated: 29 mL/min — ABNORMAL LOW (ref >60)
Glucose, Bld: 175 mg/dL — ABNORMAL HIGH (ref 70–99)
Potassium: 4.2 mmol/L (ref 3.5–5.1)
Sodium: 136 mmol/L (ref 135–145)

## 2021-03-10 DIAGNOSIS — Z87898 Personal history of other specified conditions: Secondary | ICD-10-CM | POA: Diagnosis not present

## 2021-03-13 ENCOUNTER — Telehealth (HOSPITAL_COMMUNITY): Payer: Self-pay

## 2021-03-13 NOTE — Telephone Encounter (Signed)
Received a fax requesting medical clearance from [facility]. Clearance was successfully faxed to: [phone] ,which was the number provided.. Medical clearance form will be scanned into patients chart.

## 2021-03-22 ENCOUNTER — Ambulatory Visit: Payer: Medicare HMO

## 2021-03-23 NOTE — Pre-Procedure Instructions (Signed)
Surgical Instructions:    Your procedure is scheduled on Tuesday 03/28/21 (09:00 AM- 10:00 AM).  Report to [facility] Main Entrance "A" at 07:00 A.M., then check in with the Admitting office.  Call this number if you have any questions prior to, or have any problems the morning of surgery:  [phone]    Remember:  Do not eat after midnight the night before your surgery.  You may drink clear liquids until 06:00 AM the morning of your surgery.   Clear liquids allowed are: Water, Non-Citrus Juices (without pulp), Carbonated Beverages, Clear Tea, Black Coffee Only, and Gatorade.  Please complete your PRE-SURGERY 10 oz Water that was provided to you by 06:00 AM the morning of surgery.  Please, if able, drink it in one setting. DO NOT SIP.      Take these medicines the morning of surgery with A SIP OF WATER: carvedilol (COREG) DULoxetine (CYMBALTA)   IF NEEDED: acetaminophen (TYLENOL 8 HOUR)  nitroGLYCERIN (NITROSTAT)  >>Follow your surgeon's instructions concerning apixaban (ELIQUIS). << If no instructions were given by your surgeon then you will need to call the office to get those instructions.     As of today, STOP taking any Aspirin (unless otherwise instructed by your surgeon) Aleve, Naproxen, Ibuprofen, Motrin, Advil, Goody's, BC's, all herbal medications, fish oil, and all vitamins.            WHAT DO I DO ABOUT MY DIABETES MEDICATION?   Do not take empagliflozin (JARDIANCE) the day before or the morning of surgery.     HOW TO MANAGE YOUR DIABETES BEFORE AND AFTER SURGERY  Why is it important to control my blood sugar before and after surgery? . Improving blood sugar levels before and after surgery helps healing and can limit problems. . A way of improving blood sugar control is eating a healthy diet by: o  Eating less sugar and carbohydrates o  Increasing activity/exercise o  Talking with your doctor about reaching your blood sugar goals . High blood sugars  (greater than 180 mg/dL) can raise your risk of infections and slow your recovery, so you will need to focus on controlling your diabetes during the weeks before surgery. . Make sure that the doctor who takes care of your diabetes knows about your planned surgery including the date and location.  How do I manage my blood sugar before surgery? . Check your blood sugar at least 4 times a day, starting 2 days before surgery, to make sure that the level is not too high or low. . Check your blood sugar the morning of your surgery when you wake up and every 2 hours until you get to the Short Stay unit. o If your blood sugar is less than 70 mg/dL, you will need to treat for low blood sugar: - Do not take insulin. - Treat a low blood sugar (less than 70 mg/dL) with  cup of clear juice (cranberry or apple), 4 glucose tablets, OR glucose gel. - Recheck blood sugar in 15 minutes after treatment (to make sure it is greater than 70 mg/dL). If your blood sugar is not greater than 70 mg/dL on recheck, call [phone] for further instructions. . Report your blood sugar to the short stay nurse when you get to Short Stay.  . If you are admitted to the hospital after surgery: o Your blood sugar will be checked by the staff and you will probably be given insulin after surgery (instead of oral diabetes medicines) to make sure  you have good blood sugar levels. o The goal for blood sugar control after surgery is 80-180 mg/dL.     Special instructions:   - Preparing For Surgery  Before surgery, you can play an important role. Because skin is not sterile, your skin needs to be as free of germs as possible. You can reduce the number of germs on your skin by washing with CHG (chlorahexidine gluconate) Soap before surgery.  CHG is an antiseptic cleaner which kills germs and bonds with the skin to continue killing germs even after washing.    Oral Hygiene is also important to reduce your risk of infection.   Remember - BRUSH YOUR TEETH THE MORNING OF SURGERY WITH YOUR REGULAR TOOTHPASTE  Please do not use if you have an allergy to CHG or antibacterial soaps. If your skin becomes reddened/irritated stop using the CHG.  Do not shave (including legs and underarms) for at least 48 hours prior to first CHG shower. It is OK to shave your face.  Please follow these instructions carefully.   1. Shower the NIGHT BEFORE SURGERY and the MORNING OF SURGERY  2. If you chose to wash your hair, wash your hair first as usual with your normal shampoo.  3. After you shampoo, rinse your hair and body thoroughly to remove the shampoo.  4. Wash Face and genitals (private parts) with your normal soap.   5. Use CHG Soap as you would any other liquid soap. You can apply CHG directly to the skin and wash gently with a scrungie or a clean washcloth.   6. Apply the CHG Soap to your body ONLY FROM THE NECK DOWN.  Do not use on open wounds or open sores. Avoid contact with your eyes, ears, mouth and genitals (private parts). Wash Face and genitals (private parts)  with your normal soap.   7. Wash thoroughly, paying special attention to the area where your surgery will be performed.  8. Thoroughly rinse your body with warm water from the neck down.  9. DO NOT shower/wash with your normal soap after using and rinsing off the CHG Soap.  10. Pat yourself dry with a CLEAN TOWEL.  11. Wear CLEAN PAJAMAS to bed the night before surgery.  12. Place CLEAN SHEETS on your bed the night before your surgery.  13. DO NOT SLEEP WITH PETS.   Day of Surgery: SHOWER with CHG soap. Brush your teeth WITH YOUR REGULAR TOOTHPASTE. Wear Clean/Comfortable clothing the morning of surgery. Do not apply any deodorants/lotions.   Do not wear jewelry, make up, or nail polish. Do not shave 48 hours prior to surgery.    Do NOT Smoke (Tobacco/Vaping) or drink Alcohol 24 hours prior to your procedure. Do not bring valuables to the  hospital. [facility] is not responsible for any belongings or valuables.  If you use a CPAP at night, you may bring all equipment for your overnight stay.   Contacts, glasses, or dentures may not be worn into surgery, please bring cases for these belongings.   For patients admitted to the hospital, discharge time will be determined by your treatment team.   Patients discharged the day of surgery will not be allowed to drive home, and someone needs to stay with them for 24 hours.    Please read over the following fact sheets that you were given.

## 2021-03-24 ENCOUNTER — Encounter (HOSPITAL_COMMUNITY)
Admission: RE | Admit: 2021-03-24 | Discharge: 2021-03-24 | Disposition: A | Discharge status: Home / Self Care | Payer: Medicare HMO | Source: Ambulatory Visit | Attending: Orthopedic Surgery | Admitting: Orthopedic Surgery

## 2021-03-24 ENCOUNTER — Encounter (HOSPITAL_COMMUNITY): Payer: Self-pay

## 2021-03-24 ENCOUNTER — Other Ambulatory Visit: Payer: Self-pay

## 2021-03-24 DIAGNOSIS — Z7984 Long term (current) use of oral hypoglycemic drugs: Secondary | ICD-10-CM | POA: Insufficient documentation

## 2021-03-24 DIAGNOSIS — Z20822 Contact with and (suspected) exposure to covid-19: Secondary | ICD-10-CM | POA: Diagnosis not present

## 2021-03-24 DIAGNOSIS — Z79899 Other long term (current) drug therapy: Secondary | ICD-10-CM | POA: Diagnosis not present

## 2021-03-24 DIAGNOSIS — Z7901 Long term (current) use of anticoagulants: Secondary | ICD-10-CM | POA: Insufficient documentation

## 2021-03-24 DIAGNOSIS — I429 Cardiomyopathy, unspecified: Secondary | ICD-10-CM | POA: Diagnosis not present

## 2021-03-24 DIAGNOSIS — I4891 Unspecified atrial fibrillation: Secondary | ICD-10-CM | POA: Insufficient documentation

## 2021-03-24 DIAGNOSIS — I130 Hypertensive heart and chronic kidney disease with heart failure and stage 1 through stage 4 chronic kidney disease, or unspecified chronic kidney disease: Secondary | ICD-10-CM | POA: Diagnosis not present

## 2021-03-24 DIAGNOSIS — N183 Chronic kidney disease, stage 3 unspecified: Secondary | ICD-10-CM | POA: Diagnosis not present

## 2021-03-24 DIAGNOSIS — Z01818 Encounter for other preprocedural examination: Secondary | ICD-10-CM | POA: Insufficient documentation

## 2021-03-24 DIAGNOSIS — I5022 Chronic systolic (congestive) heart failure: Secondary | ICD-10-CM | POA: Diagnosis not present

## 2021-03-24 DIAGNOSIS — G4733 Obstructive sleep apnea (adult) (pediatric): Secondary | ICD-10-CM | POA: Insufficient documentation

## 2021-03-24 DIAGNOSIS — Z8673 Personal history of transient ischemic attack (TIA), and cerebral infarction without residual deficits: Secondary | ICD-10-CM | POA: Diagnosis not present

## 2021-03-24 DIAGNOSIS — G5602 Carpal tunnel syndrome, left upper limb: Secondary | ICD-10-CM | POA: Insufficient documentation

## 2021-03-24 DIAGNOSIS — E1122 Type 2 diabetes mellitus with diabetic chronic kidney disease: Secondary | ICD-10-CM | POA: Insufficient documentation

## 2021-03-24 HISTORY — DX: Cardiac arrhythmia, unspecified: I49.9

## 2021-03-24 HISTORY — DX: Gastro-esophageal reflux disease without esophagitis: K21.9

## 2021-03-24 LAB — BASIC METABOLIC PANEL
Anion gap: 12 (ref 5–15)
BUN: 67 mg/dL — ABNORMAL HIGH (ref 8–23)
CO2: 27 mmol/L (ref 22–32)
Calcium: 10 mg/dL (ref 8.9–10.3)
Chloride: 97 mmol/L — ABNORMAL LOW (ref 98–111)
Creatinine, Ser: 2.01 mg/dL — ABNORMAL HIGH (ref 0.44–1.00)
GFR, Estimated: 24 mL/min — ABNORMAL LOW (ref >60)
Glucose, Bld: 216 mg/dL — ABNORMAL HIGH (ref 70–99)
Potassium: 4.3 mmol/L (ref 3.5–5.1)
Sodium: 136 mmol/L (ref 135–145)

## 2021-03-24 LAB — CBC
HCT: 40.3 % (ref 36.0–46.0)
Hemoglobin: 12.8 g/dL (ref 12.0–15.0)
MCH: 31.5 pg (ref 26.0–34.0)
MCHC: 31.8 g/dL (ref 30.0–36.0)
MCV: 99.3 fL (ref 80.0–100.0)
Platelets: 167 10*3/uL (ref 150–400)
RBC: 4.06 MIL/uL (ref 3.87–5.11)
RDW: 12.9 % (ref 11.5–15.5)
WBC: 7.9 10*3/uL (ref 4.0–10.5)
nRBC: 0 % (ref 0.0–0.2)

## 2021-03-24 LAB — HEMOGLOBIN A1C
Hgb A1c MFr Bld: 7.3 % — ABNORMAL HIGH (ref 4.8–5.6)
Mean Plasma Glucose: 162.81 mg/dL

## 2021-03-24 NOTE — Progress Notes (Addendum)
PCP - Alcus Dad, MD Cardiologist - Marlyce Huge, MD  PPM/ICD - denies Device Orders - N/A Rep Notified - N/A  Chest x-ray - N/A EKG - 03/24/2021 Stress Test - denies ECHO - 02/28/2021 Cardiac Cath - 05/12/2008  Sleep Study - yes CPAP - patient is not using due to nose bleeding and breathing problems.  CBG today - 114 Fasting Blood Sugar - 130-140 Checks Blood Sugar once a day A1C checked on 03/24/2021  Blood Thinner Instructions: Per MD patient will stop Eliquis two days before surgery.  Aspirin Instructions: Patient was instructed: As of today, STOP taking any Aspirin (unless otherwise instructed by your surgeon) Aleve, Naproxen, Ibuprofen, Motrin, Advil, Goody's, BC's, all herbal medications, fish oil, and all vitamins  ERAS Protcol - yes PRE-SURGERY - water  COVID TEST- 03/24/2021   Anesthesia review: yes; cardiac hx; medical clearance  Patient denies shortness of breath, fever, cough and chest pain at PAT appointment   All instructions explained to the patient, with a verbal understanding of the material. Patient agrees to go over the instructions while at home for a better understanding. Patient also instructed to self quarantine after being tested for COVID-19. The opportunity to ask questions was provided.

## 2021-03-24 NOTE — Progress Notes (Signed)
Abnormal Creatinine 2.01 in PAT. The result was communicate to Dr. Fredna Dow (talked with Marcelina Morel at his office) and to [place], [place].

## 2021-03-25 LAB — SARS CORONAVIRUS 2 (TAT 6-24 HRS): SARS Coronavirus 2: NEGATIVE

## 2021-03-27 ENCOUNTER — Telehealth: Payer: Self-pay

## 2021-03-27 NOTE — Anesthesia Preprocedure Evaluation (Addendum)
Anesthesia Evaluation  Patient identified by MRN, date of birth, ID band Patient awake    Reviewed: Allergy & Precautions, NPO status , Patient's Chart, lab work & pertinent test results  Airway Mallampati: II   Neck ROM: Full    Dental  (+) Dental Advisory Given   Pulmonary sleep apnea ,    breath sounds clear to auscultation       Cardiovascular hypertension, Pt. on medications and Pt. on home beta blockers + CAD, + CABG and +CHF  + dysrhythmias  Rhythm:Regular Rate:Normal     Neuro/Psych  Neuromuscular disease CVA    GI/Hepatic Neg liver ROS, GERD  ,  Endo/Other  diabetes, Type 2  Renal/GU CRFRenal disease     Musculoskeletal  (+) Arthritis ,   Abdominal   Peds  Hematology negative hematology ROS (+)   Anesthesia Other Findings   Reproductive/Obstetrics                            Anesthesia Physical Anesthesia Plan  ASA: III  Anesthesia Plan: Bier Block and Bier Block-LIDOCAINE ONLY   Post-op Pain Management:    Induction:   PONV Risk Score and Plan: 2 and Propofol infusion, Ondansetron and Treatment may vary due to age or medical condition  Airway Management Planned: Natural Airway and Simple Face Mask  Additional Equipment:   Intra-op Plan:   Post-operative Plan:   Informed Consent: I have reviewed the patients History and Physical, chart, labs and discussed the procedure including the risks, benefits and alternatives for the proposed anesthesia with the patient or authorized representative who has indicated his/her understanding and acceptance.       Plan Discussed with: CRNA and Surgeon  Anesthesia Plan Comments: (PAT note written 03/27/2021 by Myra Gianotti, PA-C. )       Anesthesia Quick Evaluation

## 2021-03-27 NOTE — Progress Notes (Signed)
Anesthesia Chart Review:  Case: [id number] Date/Time: 03/28/21 0845   Procedures:      LEFT CARPAL TUNNEL RELEASE (Left ) - 60 MIN     EXCISION MASS LEFT THUMB (Left )   Anesthesia type: Regional   Pre-op diagnosis: LEFT CARPAL TUNNEL SYNDROME, MASS LEFT THUMB   Location: [place] [facility] OR ROOM 04 / [facility]   Surgeons: Daryll Brod, MD      DISCUSSION: Patient is an 80 year old female scheduled for the above procedure.  History includes never smoker, HTN, CAD (CABG x5 05/17/08: LIMA-LAD-DIAG, SVG-CX, SVG-PDA-PLA), ischemic cardiomyopathy, chronic systolic CHF ("with prominent RV failure"; "PYP scan negative and myeloma workup negative, no evidence for cardiac amyloidosis"), atrial fibrillation (chronic since 2013), CVA (03/2016), DM2, HLD, CKD (stage IIII), chronic pain syndrome, thrombocytopenia (appears mild, intermittent since 2017), GI bleed 03/2017, likely diverticular bleed in setting of Eliquis), GERD, OSA ("severe with AHI 34/hr"; reported current intolerance to CPAP due to nose bleeds).  Last visit with HF cardiologist Dr. Aundra Dubin 02/28/21. Weight down, but torsemide increased and spironolactone for volume overload. Next follow-up is scheduled for 04/05/21.He signed a note of cardiac clearance for procedure with instructions that patient may hold Eliquis for 2 days prior to surgery.   Creatinine 2.01, BUN 67, eGFR 24, previous Creatinine 12/12/20-03/07/21 1.74-2.07 with eGFR 24-29, so overall renal function appears stable.   Preoperative COVID-19 test - 03/24/2021.  Anesthesia team to evaluate on the day of surgery.   VS: BP (!) 114/49   Pulse 74   Temp 36.9 C (Oral)   Resp 18   Ht 5' 9" (1.753 m)   LMP  (LMP Unknown)   SpO2 100%   BMI 31.75 kg/m     PROVIDERS: Alcus Dad, MD is PCP  Fransico Him, MD is cardiologist (for OSA) Loralie Champagne, MD is HF cardiologist - Last nephrology visit seen 11/26/18 with Biagio Quint, MD ([facility]) for follow-up CKD stage III due to  DM, HTN, cardiorenal syndrome.   LABS: Preoperative labs noted. See DISCUSSION. (all labs ordered are listed, but only abnormal results are displayed)  Labs Reviewed  BASIC METABOLIC PANEL - Abnormal; Notable for the following components:      Result Value   Chloride 97 (*)    Glucose, Bld 216 (*)    BUN 67 (*)    Creatinine, Ser 2.01 (*)    GFR, Estimated 24 (*)    All other components within normal limits  HEMOGLOBIN A1C - Abnormal; Notable for the following components:   Hgb A1c MFr Bld 7.3 (*)    All other components within normal limits  SARS CORONAVIRUS 2 (TAT 6-24 HRS)  CBC    EKG: 03/24/21: Atrial fibrillation at 76 bpm Low voltage QRS Non-specific intra-ventricular conduction delay Nonspecific T wave abnormality Abnormal ECG No significant change was found Confirmed by Virl Axe [phone]) on 03/25/2021 1:01:10 PM   CV: Echo 02/28/21: IMPRESSIONS  1. Left ventricular ejection fraction, by estimation, is 45%. The left  ventricle has mildly decreased function. The left ventricle demonstrates  global hypokinesis. Left ventricular diastolic parameters are  indeterminate.  2. Right ventricular systolic function is mildly reduced. The right  ventricular size is normal. There is moderately elevated pulmonary artery  systolic pressure. The estimated right ventricular systolic pressure is  69.6 mmHg.  3. Left atrial size was moderately dilated.  4. Right atrial size was mildly dilated.  5. The mitral valve is normal in structure. Mild mitral valve  regurgitation. No  evidence of mitral stenosis.  6. Tricuspid valve regurgitation is moderate.  7. The aortic valve is tricuspid. Aortic valve regurgitation is mild.  Mild aortic valve sclerosis is present, with no evidence of aortic valve  stenosis.  8. The inferior vena cava is dilated in size with <50% respiratory  variability, suggesting right atrial pressure of 15 mmHg.  - (Comparison: 07/24/19: LVEF 45%,  diffuse hypokinesis, mild MR, mildly reduced RV systolic function, mild RVH, PA systolic pressure 46 mmHg)   NM Cardiac Amyloidosis Scan 08/15/20: IMPRESSION: - Visual and quantitative assessment (grade 1, H/CLL equal 0.8) are not suggestive of transthyretin amyloidosis. - Incidental tracer uptake within the thoracic spine is identified suggesting an underlying bone abnormality. This may reflect thoracic compression deformity, inflammation or infection or underlying bone lesion. As an initial workup recommend further evaluation with plain film radiographs of the thoracic spine. Alternatively, CT or MRI may be obtained for more definitive characterization.   Last cardiac cath was pre-CABG (severe 3V CAD, LVEF 20%, elevated pulmonary artery pressure 73/30 with a mean of 48 and elevated pulmonary wedge pressure of 32.). S/p CABG x5 05/17/08.   Past Medical History:  Diagnosis Date  . ACC/AHA stage C congestive heart failure due to ischemic cardiomyopathy ([place]) 01/31/2009   Qualifier: Diagnosis of  By: Olevia Perches, MD, Glenetta Hew   . ANXIETY 01/31/2009   Qualifier: Diagnosis of  By: Lovette Cliche, CNA, Christy    . Arthritis    "qwhere" (03/30/2016)  . Benign essential HTN   . CAD (coronary artery disease) 05/2008   a. s/p CABG in 2009.  Marland Kitchen Cerebrovascular accident (CVA) due to embolism of right middle cerebral artery ([place])   . Chronic anticoagulation   . Chronic kidney disease (CKD), stage III (moderate) (HCC)   . Chronic lower back pain   . Chronic pain syndrome   . Chronic systolic CHF (congestive heart failure) ([place])   . DM type 2 with diabetic peripheral neuropathy ([place])   . Dysrhythmia    A Fib  . Facial weakness, post-stroke   . Gastrointestinal hemorrhage 03/26/2017  . GERD (gastroesophageal reflux disease)   . Hemiparesis ([place])   . History of CVA with residual deficit 03/26/2017  . Hyperlipidemia   . Hypertension   . Ischemic cardiomyopathy   . Longstanding persistent atrial  fibrillation ([place])    a. Postoperative in 2009;  S/P transesophageal echocardiography-guided cardioversion, previously on Amiodarone and Coumadin. b. recurrent in April 2013 -> pt elected rate control strategy, initially Coumadin -> had stroke with subtherapeutic INR in 03/2016 and transitioned to Eliquis.  . Macular degeneration   . Morbid obesity ([place]) 03/26/2017  . OSA on CPAP    severe OSA with AHI 34/hr  . Persistent atrial fibrillation ([place])   . Thrombocytopenia ([place])     Past Surgical History:  Procedure Laterality Date  . APPENDECTOMY  1946  . BACK SURGERY    . CATARACT EXTRACTION Right 2012  . COLONOSCOPY WITH PROPOFOL Left 03/27/2017   Procedure: COLONOSCOPY WITH PROPOFOL;  Surgeon: Ronnette Juniper, MD;  Location: [facility];  Service: Gastroenterology;  Laterality: Left;  . CORONARY ANGIOPLASTY WITH STENT PLACEMENT  2009   "put 2 stents in"  . EYE SURGERY     bilateral cataract  . HERNIA REPAIR    . IR LUMBAR Hansen W/IMG GUIDE  10/08/2018  . JOINT REPLACEMENT    . TEE WITH CARDIOVERSION     Postoperative in 2009;  S/P transesophageal echocardiography-guided cardioversion,  . TOTAL KNEE  ARTHROPLASTY Right 1994  . VENTRAL HERNIA REPAIR  10/1999   With mesh    MEDICATIONS: . acetaminophen (TYLENOL 8 HOUR) 650 MG CR tablet  . apixaban (ELIQUIS) 5 MG TABS tablet  . atorvastatin (LIPITOR) 40 MG tablet  . Blood Glucose Monitoring Suppl (ONE TOUCH ULTRA 2) w/Device KIT  . carvedilol (COREG) 6.25 MG tablet  . cholecalciferol (VITAMIN D3) 25 MCG (1000 UNIT) tablet  . DULoxetine (CYMBALTA) 20 MG capsule  . empagliflozin (JARDIANCE) 10 MG TABS tablet  . ferrous sulfate 325 (65 FE) MG tablet  . glucose blood (ONE TOUCH ULTRA TEST) test strip  . Lancets (ONETOUCH DELICA PLUS [id number]) MISC  . nitroGLYCERIN (NITROSTAT) 0.4 MG SL tablet  . potassium chloride SA (KLOR-CON) 20 MEQ tablet  . rOPINIRole (REQUIP) 0.5 MG tablet  . spironolactone (ALDACTONE) 25 MG tablet  .  spironolactone (ALDACTONE) 25 MG tablet  . torsemide (DEMADEX) 20 MG tablet  . torsemide (DEMADEX) 20 MG tablet  . vitamin B-12 (CYANOCOBALAMIN) 1000 MCG tablet   No current facility-administered medications for this encounter.     Myra Gianotti, PA-C Surgical Short Stay/Anesthesiology [facility] Phone [phone] [facility] Phone [phone] 03/27/2021 10:34 AM

## 2021-03-27 NOTE — Telephone Encounter (Signed)
Patient LVM on nurse line wanting PCP to know she is going into surgery tomorrow. If you have any questions you can call her.

## 2021-03-28 ENCOUNTER — Encounter (HOSPITAL_COMMUNITY): Payer: Self-pay | Admitting: Orthopedic Surgery

## 2021-03-28 ENCOUNTER — Ambulatory Visit (HOSPITAL_COMMUNITY): Payer: Medicare HMO | Admitting: Anesthesiology

## 2021-03-28 ENCOUNTER — Ambulatory Visit (HOSPITAL_COMMUNITY): Payer: Medicare HMO | Admitting: Physician Assistant

## 2021-03-28 ENCOUNTER — Ambulatory Visit (HOSPITAL_COMMUNITY)
Admission: RE | Admit: 2021-03-28 | Discharge: 2021-03-28 | Disposition: A | Discharge status: Home / Self Care | Payer: Medicare HMO | Attending: Orthopedic Surgery | Admitting: Orthopedic Surgery

## 2021-03-28 ENCOUNTER — Encounter (HOSPITAL_COMMUNITY)
Admission: RE | Disposition: A | Discharge status: Home / Self Care | Payer: Self-pay | Source: Home / Self Care | Attending: Orthopedic Surgery

## 2021-03-28 DIAGNOSIS — Z888 Allergy status to other drugs, medicaments and biological substances status: Secondary | ICD-10-CM | POA: Insufficient documentation

## 2021-03-28 DIAGNOSIS — I5022 Chronic systolic (congestive) heart failure: Secondary | ICD-10-CM | POA: Diagnosis not present

## 2021-03-28 DIAGNOSIS — E1122 Type 2 diabetes mellitus with diabetic chronic kidney disease: Secondary | ICD-10-CM | POA: Diagnosis not present

## 2021-03-28 DIAGNOSIS — E1142 Type 2 diabetes mellitus with diabetic polyneuropathy: Secondary | ICD-10-CM | POA: Diagnosis not present

## 2021-03-28 DIAGNOSIS — I130 Hypertensive heart and chronic kidney disease with heart failure and stage 1 through stage 4 chronic kidney disease, or unspecified chronic kidney disease: Secondary | ICD-10-CM | POA: Diagnosis not present

## 2021-03-28 DIAGNOSIS — G5602 Carpal tunnel syndrome, left upper limb: Secondary | ICD-10-CM | POA: Diagnosis not present

## 2021-03-28 DIAGNOSIS — N183 Chronic kidney disease, stage 3 unspecified: Secondary | ICD-10-CM | POA: Insufficient documentation

## 2021-03-28 DIAGNOSIS — Z91048 Other nonmedicinal substance allergy status: Secondary | ICD-10-CM | POA: Diagnosis not present

## 2021-03-28 DIAGNOSIS — Z955 Presence of coronary angioplasty implant and graft: Secondary | ICD-10-CM | POA: Diagnosis not present

## 2021-03-28 DIAGNOSIS — Z96651 Presence of right artificial knee joint: Secondary | ICD-10-CM | POA: Insufficient documentation

## 2021-03-28 DIAGNOSIS — D3612 Benign neoplasm of peripheral nerves and autonomic nervous system, upper limb, including shoulder: Secondary | ICD-10-CM | POA: Diagnosis not present

## 2021-03-28 DIAGNOSIS — R2232 Localized swelling, mass and lump, left upper limb: Secondary | ICD-10-CM | POA: Diagnosis not present

## 2021-03-28 DIAGNOSIS — Z951 Presence of aortocoronary bypass graft: Secondary | ICD-10-CM | POA: Diagnosis not present

## 2021-03-28 DIAGNOSIS — Z7901 Long term (current) use of anticoagulants: Secondary | ICD-10-CM | POA: Diagnosis not present

## 2021-03-28 DIAGNOSIS — Z882 Allergy status to sulfonamides status: Secondary | ICD-10-CM | POA: Insufficient documentation

## 2021-03-28 HISTORY — PX: EXCISION METACARPAL MASS: SHX6372

## 2021-03-28 HISTORY — PX: CARPAL TUNNEL RELEASE: SHX101

## 2021-03-28 LAB — GLUCOSE, CAPILLARY
Glucose-Capillary: 169 mg/dL — ABNORMAL HIGH (ref 70–99)
Glucose-Capillary: 166 mg/dL — ABNORMAL HIGH (ref 70–99)

## 2021-03-28 SURGERY — CARPAL TUNNEL RELEASE
Anesthesia: Regional | Site: Hand | Laterality: Left

## 2021-03-28 MED ORDER — LIDOCAINE HCL (PF) 0.5 % IJ SOLN
INTRAMUSCULAR | Status: DC | PRN
  Administered 2021-03-28: 30 mL via INTRAVENOUS

## 2021-03-28 MED ORDER — ORAL CARE MOUTH RINSE: 15.0000 mL | Freq: Once | OROMUCOSAL | Status: AC

## 2021-03-28 MED ORDER — PROPOFOL 10 MG/ML IV BOLUS
INTRAVENOUS | Status: DC | PRN
  Administered 2021-03-28: 40 mg via INTRAVENOUS

## 2021-03-28 MED ORDER — FENTANYL CITRATE (PF) 100 MCG/2ML IJ SOLN: 25.0000 ug | INTRAMUSCULAR | Status: DC | PRN

## 2021-03-28 MED ORDER — PROPOFOL 10 MG/ML IV BOLUS
INTRAVENOUS | Status: AC
  Filled 2021-03-28: qty 20

## 2021-03-28 MED ORDER — LIDOCAINE 2% (20 MG/ML) 5 ML SYRINGE
INTRAMUSCULAR | Status: DC | PRN
  Administered 2021-03-28: 20 mg via INTRAVENOUS

## 2021-03-28 MED ORDER — FENTANYL CITRATE (PF) 100 MCG/2ML IJ SOLN
INTRAMUSCULAR | Status: AC
  Filled 2021-03-28: qty 2

## 2021-03-28 MED ORDER — BUPIVACAINE HCL (PF) 0.25 % IJ SOLN
INTRAMUSCULAR | Status: AC
  Filled 2021-03-28: qty 30

## 2021-03-28 MED ORDER — MIDAZOLAM HCL 2 MG/2ML IJ SOLN
INTRAMUSCULAR | Status: AC
  Filled 2021-03-28: qty 2

## 2021-03-28 MED ORDER — TRAMADOL HCL 50 MG PO TABS: 50.0000 mg | ORAL_TABLET | Freq: Four times a day (QID) | ORAL | 0 refills | Status: DC | PRN

## 2021-03-28 MED ORDER — 0.9 % SODIUM CHLORIDE (POUR BTL) OPTIME
TOPICAL | Status: DC | PRN
  Administered 2021-03-28: 1000 mL

## 2021-03-28 MED ORDER — CEFAZOLIN SODIUM-DEXTROSE 2-4 GM/100ML-% IV SOLN
2.0000 g | INTRAVENOUS | Status: AC
  Administered 2021-03-28: 2 g via INTRAVENOUS
  Filled 2021-03-28: qty 100

## 2021-03-28 MED ORDER — PROPOFOL 500 MG/50ML IV EMUL
INTRAVENOUS | Status: DC | PRN
  Administered 2021-03-28: 25 ug/kg/min via INTRAVENOUS

## 2021-03-28 MED ORDER — PHENYLEPHRINE HCL-NACL 10-0.9 MG/250ML-% IV SOLN
INTRAVENOUS | Status: AC
  Filled 2021-03-28: qty 250

## 2021-03-28 MED ORDER — CHLORHEXIDINE GLUCONATE 0.12 % MT SOLN
15.0000 mL | Freq: Once | OROMUCOSAL | Status: AC
  Administered 2021-03-28: 15 mL via OROMUCOSAL
  Filled 2021-03-28: qty 15

## 2021-03-28 MED ORDER — SODIUM CHLORIDE 0.9 % IV SOLN: INTRAVENOUS | Status: DC

## 2021-03-28 MED ORDER — FENTANYL CITRATE (PF) 250 MCG/5ML IJ SOLN
INTRAMUSCULAR | Status: AC
  Filled 2021-03-28: qty 5

## 2021-03-28 SURGICAL SUPPLY — 36 items
BNDG COHESIVE 3X5 TAN STRL LF (GAUZE/BANDAGES/DRESSINGS) ×3 IMPLANT
BNDG ESMARK 4X9 LF (GAUZE/BANDAGES/DRESSINGS) ×3 IMPLANT
BNDG GAUZE ELAST 4 BULKY (GAUZE/BANDAGES/DRESSINGS) ×3 IMPLANT
CHLORAPREP W/TINT 26 (MISCELLANEOUS) ×3 IMPLANT
CNTNR URN SCR LID CUP LEK RST (MISCELLANEOUS) ×2 IMPLANT
CONT SPEC 4OZ STRL OR WHT (MISCELLANEOUS) ×3
CORD BIPOLAR FORCEPS 12FT (ELECTRODE) ×3 IMPLANT
COVER SURGICAL LIGHT HANDLE (MISCELLANEOUS) ×3 IMPLANT
COVER WAND RF STERILE (DRAPES) ×3 IMPLANT
CUFF TOURN SGL QUICK 18X4 (TOURNIQUET CUFF) ×6 IMPLANT
CUFF TOURN SGL QUICK 24 (TOURNIQUET CUFF)
CUFF TRNQT CYL 24X4X16.5-23 (TOURNIQUET CUFF) IMPLANT
DECANTER SPIKE VIAL GLASS SM (MISCELLANEOUS) IMPLANT
DRSG PAD ABDOMINAL 8X10 ST (GAUZE/BANDAGES/DRESSINGS) ×3 IMPLANT
GAUZE SPONGE 4X4 12PLY STRL (GAUZE/BANDAGES/DRESSINGS) ×3 IMPLANT
GAUZE XEROFORM 1X8 LF (GAUZE/BANDAGES/DRESSINGS) ×3 IMPLANT
GLOVE SRG 8 PF TXTR STRL LF DI (GLOVE) ×2 IMPLANT
GLOVE SURG ORTHO 8.0 STRL STRW (GLOVE) ×3 IMPLANT
GLOVE SURG UNDER POLY LF SZ8 (GLOVE) ×3
GOWN STRL REUS W/ TWL LRG LVL3 (GOWN DISPOSABLE) ×2 IMPLANT
GOWN STRL REUS W/ TWL XL LVL3 (GOWN DISPOSABLE) ×2 IMPLANT
GOWN STRL REUS W/TWL LRG LVL3 (GOWN DISPOSABLE) ×3
GOWN STRL REUS W/TWL XL LVL3 (GOWN DISPOSABLE) ×3
KIT BASIN OR (CUSTOM PROCEDURE TRAY) ×3 IMPLANT
KIT TURNOVER KIT B (KITS) ×3 IMPLANT
NEEDLE HYPO 25GX1X1/2 BEV (NEEDLE) IMPLANT
NS IRRIG 1000ML POUR BTL (IV SOLUTION) ×3 IMPLANT
PACK ORTHO EXTREMITY (CUSTOM PROCEDURE TRAY) ×3 IMPLANT
PAD ARMBOARD 7.5X6 YLW CONV (MISCELLANEOUS) ×3 IMPLANT
PAD CAST 4YDX4 CTTN HI CHSV (CAST SUPPLIES) IMPLANT
PADDING CAST COTTON 4X4 STRL (CAST SUPPLIES)
SUT ETHILON 4 0 PS 2 18 (SUTURE) ×6 IMPLANT
SYR CONTROL 10ML LL (SYRINGE) IMPLANT
TOWEL GREEN STERILE (TOWEL DISPOSABLE) ×3 IMPLANT
TUBE CONNECTING 12X1/4 (SUCTIONS) ×3 IMPLANT
UNDERPAD 30X36 HEAVY ABSORB (UNDERPADS AND DIAPERS) ×3 IMPLANT

## 2021-03-28 NOTE — Anesthesia Procedure Notes (Signed)
Procedure Name: MAC Date/Time: 03/28/2021 8:55 AM Performed by: Michele Rockers, CRNA Pre-anesthesia Checklist: Patient identified, Emergency Drugs available, Suction available, Timeout performed and Patient being monitored Patient Re-evaluated:Patient Re-evaluated prior to induction Oxygen Delivery Method: Simple face mask

## 2021-03-28 NOTE — H&P (Signed)
[name] is an 80 y.o. female.   Chief Complaint:numbness left hand and mass thumb HPI: [name] is an 80 year old right-hand-dominant female complaining of bilateral numbness and tingling left worse than right. She is referred by Dr. Lavell Anchors for carpal tunnel syndrome. Left is worse than right. This been going on for approximately 5 months. She calls no history of injury to the hand or to the neck. She is waking 7 out of 7 nights. She has worn splints and taken Tylenol for she also has a mass on her left thumb at the metacarpal phalangeal joint. She has contracture of her right middle finger PIP joint which has been present for period of time. She states nothing makes the numbness better or worse it is all fingers involved. It is not constant at the present time. She has a burning pain with it. She has a history of diabetes arthritis no history of thyroid problems or gout. Family history is positive diabetes. She has had nerve conductions done revealing a motor delay 7.5 on the right and 11.4 on the left with no sensory response bilateral    Past Medical History:  Diagnosis Date  . ACC/AHA stage C congestive heart failure due to ischemic cardiomyopathy ([place]) 01/31/2009   Qualifier: Diagnosis of  By: Olevia Perches, MD, Glenetta Hew   . ANXIETY 01/31/2009   Qualifier: Diagnosis of  By: Lovette Cliche, CNA, Christy    . Arthritis    "qwhere" (03/30/2016)  . Benign essential HTN   . CAD (coronary artery disease) 05/2008   a. s/p CABG in 2009.  Marland Kitchen Cerebrovascular accident (CVA) due to embolism of right middle cerebral artery ([place])   . Chronic anticoagulation   . Chronic kidney disease (CKD), stage III (moderate) (HCC)   . Chronic lower back pain   . Chronic pain syndrome   . Chronic systolic CHF (congestive heart failure) ([place])   . DM type 2 with diabetic peripheral neuropathy ([place])   . Dysrhythmia    A Fib  . Facial weakness, post-stroke   . Gastrointestinal hemorrhage 03/26/2017  . GERD  (gastroesophageal reflux disease)   . Hemiparesis ([place])   . History of CVA with residual deficit 03/26/2017  . Hyperlipidemia   . Hypertension   . Ischemic cardiomyopathy   . Longstanding persistent atrial fibrillation ([place])    a. Postoperative in 2009;  S/P transesophageal echocardiography-guided cardioversion, previously on Amiodarone and Coumadin. b. recurrent in April 2013 -> pt elected rate control strategy, initially Coumadin -> had stroke with subtherapeutic INR in 03/2016 and transitioned to Eliquis.  . Macular degeneration   . Morbid obesity ([place]) 03/26/2017  . OSA on CPAP    severe OSA with AHI 34/hr  . Persistent atrial fibrillation ([place])   . Thrombocytopenia ([place])     Past Surgical History:  Procedure Laterality Date  . APPENDECTOMY  1946  . BACK SURGERY    . CATARACT EXTRACTION Right 2012  . COLONOSCOPY WITH PROPOFOL Left 03/27/2017   Procedure: COLONOSCOPY WITH PROPOFOL;  Surgeon: Ronnette Juniper, MD;  Location: [facility];  Service: Gastroenterology;  Laterality: Left;  . CORONARY ANGIOPLASTY WITH STENT PLACEMENT  2009   "put 2 stents in"  . EYE SURGERY     bilateral cataract  . HERNIA REPAIR    . IR LUMBAR Eunice W/IMG GUIDE  10/08/2018  . JOINT REPLACEMENT    . TEE WITH CARDIOVERSION     Postoperative in 2009;  S/P transesophageal echocardiography-guided cardioversion,  . TOTAL KNEE ARTHROPLASTY Right 1994  .  VENTRAL HERNIA REPAIR  10/1999   With mesh    Family History  Problem Relation Age of Onset  . Clotting disorder Mother        Cerebral hemorrhage  . Other Mother        cerebral hemorrhage  . Emphysema Father        COD  . Lung cancer Brother   . Heart disease Neg Hx    Social History:  reports that she has never smoked. She has never used smokeless tobacco. She reports that she does not drink alcohol and does not use drugs.  Allergies:  Allergies  Allergen Reactions  . Benazepril Other (See Comments)    Unknown reaction at age 56-65 -  possibly dizziness  . Ozempic (0.25 Or 0.5 Mg-Dose) [Semaglutide(0.25 Or 0.5mg -Dos)] Diarrhea    Gi intollerance  . Tape     TOLERATES PAPER TAPE ONLY  . Angiotensin Receptor Blockers Other (See Comments)    Hypotension reaction  . Fe-Succ-C-Thre-B12-Des Stomach Other (See Comments)    Unknown reaction  . Sulfamethoxazole-Trimethoprim Other (See Comments)    Unknown reaction    No medications prior to admission.    No results found for this or any previous visit (from the past 48 hour(s)).  No results found.   Pertinent items are noted in HPI.  There were no vitals taken for this visit.  General appearance: alert, cooperative and appears stated age Head: Normocephalic, without obvious abnormality Neck: no JVD Resp: clear to auscultation bilaterally Cardio: irregularly irregular rhythm GI: soft, non-tender; bowel sounds normal; no masses,  no organomegaly Extremities: numbness left hand and mass thumb Pulses: 2+ and symmetric Skin: Skin color, texture, turgor normal. No rashes or lesions Neurologic: Grossly normal Incision/Wound: na  Assessment/Plan Diagnosis bilateral carpal tunnel syndrome with mass left thumb metacarpal phalangeal joint.   Plan: She would like to have this surgically released. Prepare postoperative course are discussed along with risk and complications. She is aware there is no guarantee to the surgery the possibility of infection recurrence injury to arteries nerves tendons complete relief symptoms dystrophy. She would like to proceed to have a carpal tunnel release and the mass excised from her left thumb. This scheduled as an outpatient under regional anesthesia. She questions are [place] answered to her and her son satisfaction.     Daryll Brod 03/28/2021, 5:16 AM

## 2021-03-28 NOTE — Anesthesia Procedure Notes (Signed)
Anesthesia Regional Block: Bier block (IV Regional)   Pre-Anesthetic Checklist: ,, timeout performed, Correct Patient, Correct Site, Correct Laterality, Correct Procedure, Correct Position, site marked, Risks and benefits discussed,  Surgical consent,  Pre-op evaluation,  At surgeon's request and post-op pain management  Laterality: Left  Prep: chloraprep        Procedures:,,,,,, Esmarch exsanguination, single tourniquet utilized, #20gu IV placed  Narrative:  Start time: 03/28/2021 9:04 AM End time: 03/28/2021 9:07 AM Injection made incrementally with aspirations every 5 mL.  Performed by: Personally  Anesthesiologist: Suzette Battiest, MD

## 2021-03-28 NOTE — Brief Op Note (Signed)
03/28/2021  9:48 AM  PATIENT:  [name]  80 y.o. female  PRE-OPERATIVE DIAGNOSIS:  LEFT CARPAL TUNNEL SYNDROME, MASS LEFT THUMB  POST-OPERATIVE DIAGNOSIS:  LEFT CARPAL TUNNEL SYNDROME, MASS LEFT THUMB  PROCEDURE:  Procedure(s) with comments: LEFT CARPAL TUNNEL RELEASE (Left) - 60 MIN EXCISION MASS LEFT THUMB (Left)  SURGEON:  Surgeon(s) and Role:    * Daryll Brod, MD - Primary  PHYSICIAN ASSISTANT:   ASSISTANTS: none   ANESTHESIA:   regional and IV sedation  EBL:  5 mL   BLOOD ADMINISTERED:none  DRAINS: none   LOCAL MEDICATIONS USED:  NONE  SPECIMEN:  Excision  DISPOSITION OF SPECIMEN:  PATHOLOGY  COUNTS:  YES  TOURNIQUET:  * Missing tourniquet times found for documented tourniquets in log: [id number] * Total Tourniquet Time Documented: Forearm (Left) - -016010 minutes Total: Forearm (Left) - -932355 minutes   DICTATION: .Viviann Spare Dictation  PLAN OF CARE: Discharge to home after PACU  PATIENT DISPOSITION:  PACU - hemodynamically stable.

## 2021-03-28 NOTE — Transfer of Care (Signed)
Immediate Anesthesia Transfer of Care Note  Patient: [name]  Procedure(s) Performed: LEFT CARPAL TUNNEL RELEASE (Left Hand) EXCISION MASS LEFT THUMB (Left )  Patient Location: PACU  Anesthesia Type:MAC  Level of Consciousness: awake, alert  and patient cooperative  Airway & Oxygen Therapy: Patient Spontanous Breathing  Post-op Assessment: Report given to RN and Post -op Vital signs reviewed and stable  Post vital signs: Reviewed and stable  Last Vitals:  Vitals Value Taken Time  BP 111/43 03/28/21 0956  Temp    Pulse 56 03/28/21 0958  Resp 18 03/28/21 0958  SpO2 100 % 03/28/21 0958  Vitals shown include unvalidated device data.  Last Pain:  Vitals:   03/28/21 0712  TempSrc:   PainSc: 0-No pain      Patients Stated Pain Goal: 2 (40/76/80 8811)  Complications: No complications documented.

## 2021-03-28 NOTE — Discharge Instructions (Signed)

## 2021-03-28 NOTE — Op Note (Signed)
NAME: [name] MEDICAL RECORD NO: [id number] DATE OF BIRTH: 1941-01-11 FACILITY: [facility] LOCATION: [facility] OR PHYSICIAN: Wynonia Sours, MD   OPERATIVE REPORT   DATE OF PROCEDURE: 03/28/21    PREOPERATIVE DIAGNOSIS:   Carpal tunnel syndrome left hand with mass metacarpal phalangeal joint thumb   POSTOPERATIVE DIAGNOSIS:   Same   PROCEDURE:   Decompression median nerve left hand with excision mass left thumb   SURGEON: Daryll Brod, M.D.   ASSISTANT: none   ANESTHESIA:  Bier block with sedation   INTRAVENOUS FLUIDS:  Per anesthesia flow sheet.   ESTIMATED BLOOD LOSS:  Minimal.   COMPLICATIONS:  None.   SPECIMENS:   1.7 cm mass   TOURNIQUET TIME:   * Missing tourniquet times found for documented tourniquets in log: [id number] * Total Tourniquet Time Documented: Forearm (Left) - -992426 minutes Total: Forearm (Left) - -834196 minutes    DISPOSITION:  Stable to PACU.   INDICATIONS: Patient is an 79 year old female with a history of numbness and tingling carpal tunnel syndrome left hand with positive nerve conductions and a mass at the metacarpal phalangeal joint of her left thumb palmarly.  She is desirous having the mass removed along with decompression of the median nerve. 3.  Postoperative course been discussed along with risks and complications.  She is aware that there is no guarantee to the surgery the possibility of infection recurrence injury to arteries nerves tendons incomplete relief of symptoms and dystrophy.  In the preoperative area the patient seen the extremity marked by both patient and surgeon antibiotic given  OPERATIVE COURSE: Patient is brought to the operating room placed in a supine position with left arm free of a forearm IV regional anesthetic was carried out without difficulty under the direction the anesthesia department.  She was prepped with ChloraPrep.  3-minute dry time was allowed timeout taken to confirm patient procedure.  A longitudinal incision  was made left palm carried down through subcutaneous tissue.  Bleeders were electrocauterized with bipolar.  Palmar fascia was split.  Superficial palmar arch and flexor tendon to the ring little finger was identified.  Retractors were treatment placed retracting median nerve radially ulnar nerve ulnarly.  Flexor retinaculum was then released on its ulnar border.  A right angle and stool retractor placed between skin and forearm fascia and the fascia was released for approximately 2 and half to 3 cm proximal to the wrist crease under direct vision.  The canal was explored.  The motor branch entered into muscle distally.  Area compression to the nerve was apparent.  No further lesions were identified.  The wound was copiously irrigated with saline.  This was partially closed.  It was done with interrupted 4-0 nylon sutures packing the proximal aspect of all the mass was removed from her thumb.  It volar Alyse Low type incision was made over the mass carried down through subcutaneous tissue.  Neurovascular structures identified and protected a pearly white mass was then immediately encountered with blunt sharp dissection this was dissected free from the underlying tissue.  It was well encapsulated.  The specimen measured proximal 1.7 cm and sent to pathology the wound was copious irrigated with saline.  The skin was then closed with interrupted 4-0 nylon sutures.  The proximal aspect of the carpal tunnel release was then closed with interrupted 4-0 nylon sutures and a sterile compressive dressing with a special compression at the metacarpal phalangeal joint flap on her thumb was applied with the fingers free.  And  deflation of the tourniquet all fingers immediately pink.  She was taken to the recovery room for observation in satisfactory condition.  She will be discharged home to return to the hand center [facility] in 1 week on Tylenol and Ultram for pain.   Daryll Brod, MD Electronically signed, 03/28/21

## 2021-03-29 ENCOUNTER — Encounter (HOSPITAL_COMMUNITY): Payer: Self-pay | Admitting: Orthopedic Surgery

## 2021-03-29 NOTE — Anesthesia Postprocedure Evaluation (Signed)
Anesthesia Post Note  Patient: [name]  Procedure(s) Performed: LEFT CARPAL TUNNEL RELEASE (Left Hand) EXCISION MASS LEFT THUMB (Left )     Patient location during evaluation: PACU Anesthesia Type: Bier Block Level of consciousness: awake and alert Pain management: pain level controlled Vital Signs Assessment: post-procedure vital signs reviewed and stable Respiratory status: spontaneous breathing, nonlabored ventilation, respiratory function stable and patient connected to nasal cannula oxygen Cardiovascular status: stable and blood pressure returned to baseline Postop Assessment: no apparent nausea or vomiting Anesthetic complications: no   No complications documented.  Last Vitals:  Vitals:   03/28/21 1000 03/28/21 1010  BP:  (!) 109/47  Pulse: 65 60  Resp: 20 18  Temp:  (!) 36.4 C  SpO2: 100% 100%    Last Pain:  Vitals:   03/28/21 1010  TempSrc:   PainSc: 0-No pain                 Tiajuana Amass

## 2021-03-30 ENCOUNTER — Ambulatory Visit: Payer: Medicare HMO | Admitting: Podiatry

## 2021-03-30 LAB — SURGICAL PATHOLOGY

## 2021-04-05 ENCOUNTER — Ambulatory Visit (HOSPITAL_COMMUNITY)
Admission: RE | Admit: 2021-04-05 | Discharge: 2021-04-05 | Disposition: A | Discharge status: Home / Self Care | Payer: Medicare HMO | Source: Ambulatory Visit | Attending: Cardiology | Admitting: Cardiology

## 2021-04-05 ENCOUNTER — Encounter (HOSPITAL_COMMUNITY): Payer: Self-pay

## 2021-04-05 ENCOUNTER — Other Ambulatory Visit: Payer: Self-pay

## 2021-04-05 VITALS — BP 118/88 | HR 77 | Wt 203.6 lb

## 2021-04-05 DIAGNOSIS — G5603 Carpal tunnel syndrome, bilateral upper limbs: Secondary | ICD-10-CM | POA: Diagnosis not present

## 2021-04-05 DIAGNOSIS — Z7984 Long term (current) use of oral hypoglycemic drugs: Secondary | ICD-10-CM | POA: Insufficient documentation

## 2021-04-05 DIAGNOSIS — Z79899 Other long term (current) drug therapy: Secondary | ICD-10-CM | POA: Insufficient documentation

## 2021-04-05 DIAGNOSIS — Z951 Presence of aortocoronary bypass graft: Secondary | ICD-10-CM | POA: Insufficient documentation

## 2021-04-05 DIAGNOSIS — I2510 Atherosclerotic heart disease of native coronary artery without angina pectoris: Secondary | ICD-10-CM | POA: Insufficient documentation

## 2021-04-05 DIAGNOSIS — I482 Chronic atrial fibrillation, unspecified: Secondary | ICD-10-CM | POA: Insufficient documentation

## 2021-04-05 DIAGNOSIS — G4733 Obstructive sleep apnea (adult) (pediatric): Secondary | ICD-10-CM | POA: Insufficient documentation

## 2021-04-05 DIAGNOSIS — Z8673 Personal history of transient ischemic attack (TIA), and cerebral infarction without residual deficits: Secondary | ICD-10-CM | POA: Diagnosis not present

## 2021-04-05 DIAGNOSIS — Z881 Allergy status to other antibiotic agents status: Secondary | ICD-10-CM | POA: Diagnosis not present

## 2021-04-05 DIAGNOSIS — Z888 Allergy status to other drugs, medicaments and biological substances status: Secondary | ICD-10-CM | POA: Diagnosis not present

## 2021-04-05 DIAGNOSIS — I5022 Chronic systolic (congestive) heart failure: Secondary | ICD-10-CM | POA: Diagnosis not present

## 2021-04-05 DIAGNOSIS — E1122 Type 2 diabetes mellitus with diabetic chronic kidney disease: Secondary | ICD-10-CM | POA: Insufficient documentation

## 2021-04-05 DIAGNOSIS — I130 Hypertensive heart and chronic kidney disease with heart failure and stage 1 through stage 4 chronic kidney disease, or unspecified chronic kidney disease: Secondary | ICD-10-CM | POA: Diagnosis not present

## 2021-04-05 DIAGNOSIS — Z7901 Long term (current) use of anticoagulants: Secondary | ICD-10-CM | POA: Diagnosis not present

## 2021-04-05 DIAGNOSIS — N183 Chronic kidney disease, stage 3 unspecified: Secondary | ICD-10-CM | POA: Diagnosis not present

## 2021-04-05 DIAGNOSIS — I255 Ischemic cardiomyopathy: Secondary | ICD-10-CM | POA: Diagnosis not present

## 2021-04-05 LAB — BASIC METABOLIC PANEL
Anion gap: 8 (ref 5–15)
BUN: 53 mg/dL — ABNORMAL HIGH (ref 8–23)
CO2: 30 mmol/L (ref 22–32)
Calcium: 10.3 mg/dL (ref 8.9–10.3)
Chloride: 98 mmol/L (ref 98–111)
Creatinine, Ser: 1.98 mg/dL — ABNORMAL HIGH (ref 0.44–1.00)
GFR, Estimated: 25 mL/min — ABNORMAL LOW (ref >60)
Glucose, Bld: 150 mg/dL — ABNORMAL HIGH (ref 70–99)
Potassium: 4.5 mmol/L (ref 3.5–5.1)
Sodium: 136 mmol/L (ref 135–145)

## 2021-04-05 NOTE — Progress Notes (Signed)
ID:  AKEELAH SEPPALA, DOB 1941/09/10, MRN [id number]   Provider location: [place] [facility] Type of Visit: Established patient   PCP:  Alcus Dad, MD  Cardiologist:  Fransico Him, MD Primary HF: Dr. Aundra Dubin   History of Present Illness: [name] is a 80 y.o. female who has a history of CAD status post CABG 2009, ischemic cardiomyopathy with chronic systolic CHF, persistent atrial fibrillation with stroke in 03/2016 when INR was subtherapeutic, stage III CKD, morbid obesity, pulmonary hypertension, diabetes, GI bleed 03/2017, and severe sleep apnea.   Seen in [facility] clinics 09/10/18 and 09/24/18, both times with volume overload and marked peripheral edema with skin breakdown. Torsemide increased at each visit. Echo in 7/19 showed EF 45-50% with mild RV dilation/mild RV systolic dysfunction and D-shaped interventricular septum.    AKI earlier in 7/20 in setting of excessive torsemide, she had increased dose to 60 qam/40 qpm on her own.  This was cut back to 40 mg daily and losartan and spironolactone were stopped.   Echo in 9/20 showed EF 45% with mildly decreased RV systolic function, PASP 46 mmHg with dilated IVC.  PYP scan in 10/21 was negative.   Recent echo 4/22 showed EF 45% with diffuse hypokinesis, mildly decreased RV systolic function, PASP 47, IVC dilated, moderate TR, mild MR.   Had recent f/u w/ Dr. Aundra Dubin 4/19 and was fluid overloaded and SOB w/ NYHA Class III symptoms. Torsemide was increased to 60 mg bid and 12.5 mg of spironolactone was added.   She returns today for f/u. Here w/ son. Wt down 12 lb. BP stable. Feels better. Exercise tolerance improved, able to ambulate longer w/ less dyspnea. No LEE on exam, wearing compression stockings.   She also has bilateral carpal tunnel. She had recent left CTR on 5/19 and will have the rt done at a later today. She has been screened for amyloid. PYP Scan in 2021 was negative. Urine immunofixation showed no  monoclonal protein.  M protein not observed on Multiple myeloma panel.    Labs (12/18): K 3.9, Creatinine 1.65 Labs (5/19): K 4.1, creatinine 1.7 Labs (6/19): hgb 8.2 Labs (8/19): K 4.3, creatinine 1.73 Labs (9/19): K 4, creatinine 1.56 Labs (11/19): K 4.7, creatinine 2.04 Labs (12/19): creatinine 1.44 Labs (7/20): K 5, creatinine 2.49 => 1.86, Hgb 11.1 Labs (8/20): K 3.9, creatinine 1.79 Labs (9/20): LDL 50 Labs (11/20): K 3.8, creatinine 2.01 Labs (2/21): K 4, creatinine 1.9 Labs (7/21): K 4.2, creatinine 1.84, hgb 11.2 Labs (9/21): myeloma panel negative, urine immunofixation negative, LDL 50, K 4.6, creatinine 1.76 Labs (3/22): hgb 13.4, K 4.3, creatinine 1.86 Labs (4/22): Scr 2.01, K 4.3   Review of systems complete and found to be negative unless listed in HPI.    Past Medical History 1. Chronic systolic CHF: Ischemic cardiomyopathy with prominent RV failure.   - Echo (11/18): LVEF 35-40%, severe RV dilation, severe RAE, severe TR.  - Echo (7/19): EF 45-50%, diffuse hypokinesis, mild RV dilation with mildly decreased RV systolic function, D-shaped interventricular septum suggestive of RV pressure/volume overload, mild MR, moderate TR, PASP 49 mmhg.  - Echo (9/20): EF 45%, mild LV dilation, mild RV dilation with mildly decreased systolic function, PASP 46 mmHg, IVC dilated.  - PYP scan (10/21): Negative.  - Echo (4/22): EF 45% with diffuse hypokinesis, mildly decreased RV systolic function, PASP 47, IVC dilated, moderate TR, mild MR. 2. CAD: CABG 2009. No angiography since that time.  3. HTN 4. Atrial fibrillation: Chronic since 2013.  5. CKD stage 3 6. Morbid obesity 7. OSA 8. Chronic venous stasis with RLE wound/Bullae 9. CVA 5/18.   Current Outpatient Medications  Medication Sig Dispense Refill  . acetaminophen (TYLENOL 8 HOUR) 650 MG CR tablet Take 1 tablet (650 mg total) by mouth every 8 (eight) hours as needed for pain. 30 tablet 0  . apixaban (ELIQUIS) 5 MG TABS  tablet Take 1 tablet (5 mg total) by mouth 2 (two) times daily. 180 tablet 3  . atorvastatin (LIPITOR) 40 MG tablet TAKE 1 TABLET BY MOUTH  DAILY AT 6 PM. (Patient taking differently: Take 40 mg by mouth daily at 6 PM.) 90 tablet 3  . Blood Glucose Monitoring Suppl (ONE TOUCH ULTRA 2) w/Device KIT USE TO CHECK BLOOD SUGAR UPTO 3 TIMES A DAY 1 kit 0  . carvedilol (COREG) 6.25 MG tablet TAKE 1 TABLET BY MOUTH  TWICE DAILY WITH A MEAL (Patient taking differently: Take 6.25 mg by mouth 2 (two) times daily with a meal.) 180 tablet 3  . cholecalciferol (VITAMIN D3) 25 MCG (1000 UNIT) tablet Take 1,000 Units by mouth daily.    . DULoxetine (CYMBALTA) 20 MG capsule Take 20 mg by mouth daily.    . empagliflozin (JARDIANCE) 10 MG TABS tablet Take 1 tablet (10 mg total) by mouth daily before breakfast. 90 tablet 3  . ferrous sulfate 325 (65 FE) MG tablet TAKE 1 TABLET BY MOUTH EVERY DAY WITH BREAKFAST (Patient taking differently: Take 325 mg by mouth daily with breakfast.) 90 tablet 1  . glucose blood (ONE TOUCH ULTRA TEST) test strip Use three times a day 100 each 3  . Lancets (ONETOUCH DELICA PLUS [id number]) MISC 1 application by Does not apply route daily. Please use to check blood sugar up to three times daily. 100 each 6  . nitroGLYCERIN (NITROSTAT) 0.4 MG SL tablet PLACE 1 TABLET (0.4 MG TOTAL) UNDER THE TONGUE EVERY 5 (FIVE) MINUTES AS NEEDED FOR CHEST PAIN (UP TO 3 DOSES). 15 tablet 5  . potassium chloride SA (KLOR-CON) 20 MEQ tablet Take 3 tablets (60 mEq total) by mouth daily. 450 tablet 3  . rOPINIRole (REQUIP) 0.5 MG tablet Take 1 mg by mouth at bedtime.    Marland Kitchen spironolactone (ALDACTONE) 25 MG tablet Take 0.5 tablets (12.5 mg total) by mouth daily. 45 tablet 3  . torsemide (DEMADEX) 20 MG tablet Take 3 tablets (60 mg total) by mouth 2 (two) times daily. (Patient taking differently: Take 40-60 mg by mouth See admin instructions. Take 60 mg by mouth in the morning and 40 mg in the afternoon) 545 tablet 3   . traMADol (ULTRAM) 50 MG tablet Take 1 tablet (50 mg total) by mouth every 6 (six) hours as needed. 20 tablet 0  . vitamin B-12 (CYANOCOBALAMIN) 1000 MCG tablet Take 1 tablet (1,000 mcg total) by mouth daily. 90 tablet 1  . spironolactone (ALDACTONE) 25 MG tablet Take 0.5 tablets (12.5 mg total) by mouth daily. (Patient not taking: No sig reported) 15 tablet 0  . torsemide (DEMADEX) 20 MG tablet Take 3 tablets (60 mg total) by mouth 2 (two) times daily. (Patient not taking: No sig reported) 180 tablet 0   No current facility-administered medications for this encounter.   Allergies  Allergen Reactions  . Benazepril Other (See Comments)    Unknown reaction at age 73-65 - possibly dizziness  . Ozempic (0.25 Or 0.5 Mg-Dose) [Semaglutide(0.25 Or 0.5mg -Dos)] Diarrhea    Gi  intollerance  . Tape     TOLERATES PAPER TAPE ONLY- due to thin skin. NOT ALLERGY PER PT  . Angiotensin Receptor Blockers Other (See Comments)    Hypotension reaction  . Fe-Succ-C-Thre-B12-Des Stomach Other (See Comments)    Unknown reaction  . Sulfamethoxazole-Trimethoprim Other (See Comments)    Unknown reaction   Social History   Socioeconomic History  . Marital status: Widowed    Spouse name: Not on file  . Number of children: 2  . Years of education: 93  . Highest education level: Not on file  Occupational History    Comment: retired  Tobacco Use  . Smoking status: Never Smoker  . Smokeless tobacco: Never Used  Vaping Use  . Vaping Use: Never used  Substance and Sexual Activity  . Alcohol use: No  . Drug use: Never  . Sexual activity: Not Currently  Other Topics Concern  . Not on file  Social History Narrative   Widowed   01/27/21 Lives with son,  in [place]   2 children   Not routinely exercising   Social Determinants of Health   Financial Resource Strain: Not on file  Food Insecurity: Not on file  Transportation Needs: Not on file  Physical Activity: Not on file  Stress: Not on file   Social Connections: Not on file  Intimate Partner Violence: Not on file   Family History  Problem Relation Age of Onset  . Clotting disorder Mother        Cerebral hemorrhage  . Other Mother        cerebral hemorrhage  . Emphysema Father        COD  . Lung cancer Brother   . Heart disease Neg Hx    Vitals:   04/05/21 1504  BP: 118/88  Pulse: 77  SpO2: 99%  Weight: 92.4 kg     Wt Readings from Last 3 Encounters:  04/05/21 92.4 kg  03/28/21 92.5 kg  02/28/21 97.5 kg    PHYSICAL EXAM: General:  Well appearing, elderly female No respiratory difficulty HEENT: normal Neck: supple. no JVD. Carotids 2+ bilat; no bruits. No lymphadenopathy or thyromegaly appreciated. Cor: PMI nondisplaced. Irregularly irregular rhythm and rate. No rubs, gallops or murmurs. Lungs: clear Abdomen: soft, nontender, nondistended. No hepatosplenomegaly. No bruits or masses. Good bowel sounds. Extremities: no cyanosis, clubbing, rash, edema Neuro: alert & oriented x 3, cranial nerves grossly intact. moves all 4 extremities w/o difficulty. Affect pleasant.    ASSESSMENT & PLAN:  1. Chronic systolic CHF with prominent RV failure: Ischemic cardiomyopathy.  Echo in 7/19 showed LV EF 45-50% (improved) with mildly dilated/mildly dysfunction RV.  D-shaped septum suggested RV pressure/volume overload.  Echo in 9/20 showed EF 45%, mildly dysfunctional RV, PASP 46 mmHg with dilated IVC.  PYP scan negative and myeloma workup negative, no evidence for cardiac amyloidosis.  Echo 4/22 showed EF 45% with diffuse hypokinesis, mildly decreased RV systolic function, PASP 47, IVC dilated, moderate TR, mild MR. Diuretics increased last visit.  Volume and function status improved w/ diuretic adjustment. Wt down 12 lb, Now NYHA Class II.  - Continue Jardiance 10 mg daily.  - Continue torsemide 60 mg bid.   - Continue Coreg 6.25 mg bid.   - Continue spironolactone 12.5 mg daily.   - We have discussed Cardiomems but she has  not been admitted in the last year so cannot get.  - Check BMP today  2. CAD s/p CABG: Stable w/o CP  - Continue atorvastatin 40  mg daily. Good lipids in 9/21.     - No ASA with stable CAD on Eliquis.  3. Atrial fibrillation: Chronic. Given long-term atrial fibrillation, she is unlikely to successfully cardiovert.  - Continue Eliquis.  Denies abnormal bleeding  4. CKD stage 3:   - Check BMP today after recent diuretic increase  5. H/o CVA: Continue Eliquis.   6. OSA: reports compliance w/ CPAP.  7. HTN: BP controlled on current regimen  8. Bilateral Carpal Tunnel: s/p recent Lt CTR. Waiting surgery on the Rt. W/u for cardiac amyloid unremarkable     Followup w/ Dr. Aundra Dubin in 3 months   Signed, Lyda Jester, PA-C  04/05/2021  [address] [place] [address] [phone] (office) [phone] (fax)

## 2021-04-05 NOTE — Patient Instructions (Signed)
It was great to see you today! No medication changes are needed at this time.  Labs today We will only contact you if something comes back abnormal or we need to make some changes. Otherwise no news is good news!  Your physician recommends that you schedule a follow-up appointment in: 3-4 months with Dr Aundra Dubin  Do the following things EVERYDAY: 1) Weigh yourself in the morning before breakfast. Write it down and keep it in a log. 2) Take your medicines as prescribed 3) Eat low salt foods--Limit salt (sodium) to 2000 mg per day.  4) Stay as active as you can everyday 5) Limit all fluids for the day to less than 2 liters  At the [facility], you and your health needs are our priority. As part of our continuing mission to provide you with exceptional heart care, we have created designated Provider Care Teams. These Care Teams include your primary Cardiologist (physician) and Advanced Practice Providers (APPs- Physician Assistants and Nurse Practitioners) who all work together to provide you with the care you need, when you need it.   You may see any of the following providers on your designated Care Team at your next follow up: Marland Kitchen Dr Glori Bickers . Dr Loralie Champagne . Dr Vickki Muff . Darrick Grinder, NP . Lyda Jester, [place] . Audry Riles, PharmD   Please be sure to bring in all your medications bottles to every appointment.   If you have any questions or concerns before your next appointment please send [place] a message through [place] or call our office at [phone].    TO LEAVE A MESSAGE FOR THE NURSE SELECT OPTION 2, PLEASE LEAVE A MESSAGE INCLUDING: . YOUR NAME . DATE OF BIRTH . CALL BACK NUMBER . REASON FOR CALL**this is important as we prioritize the call backs  YOU WILL RECEIVE A CALL BACK THE SAME DAY AS LONG AS YOU CALL BEFORE 4:00 PM

## 2021-04-17 ENCOUNTER — Other Ambulatory Visit: Payer: Self-pay

## 2021-04-17 ENCOUNTER — Encounter (INDEPENDENT_AMBULATORY_CARE_PROVIDER_SITE_OTHER): Payer: Self-pay | Admitting: Ophthalmology

## 2021-04-17 ENCOUNTER — Ambulatory Visit (INDEPENDENT_AMBULATORY_CARE_PROVIDER_SITE_OTHER): Payer: Medicare HMO | Admitting: Ophthalmology

## 2021-04-17 DIAGNOSIS — E113591 Type 2 diabetes mellitus with proliferative diabetic retinopathy without macular edema, right eye: Secondary | ICD-10-CM | POA: Insufficient documentation

## 2021-04-17 DIAGNOSIS — E113491 Type 2 diabetes mellitus with severe nonproliferative diabetic retinopathy without macular edema, right eye: Secondary | ICD-10-CM

## 2021-04-17 DIAGNOSIS — H353211 Exudative age-related macular degeneration, right eye, with active choroidal neovascularization: Secondary | ICD-10-CM | POA: Diagnosis not present

## 2021-04-17 DIAGNOSIS — E113492 Type 2 diabetes mellitus with severe nonproliferative diabetic retinopathy without macular edema, left eye: Secondary | ICD-10-CM

## 2021-04-17 DIAGNOSIS — H4311 Vitreous hemorrhage, right eye: Secondary | ICD-10-CM

## 2021-04-17 HISTORY — DX: Type 2 diabetes mellitus with proliferative diabetic retinopathy without macular edema, right eye: E11.3591

## 2021-04-17 NOTE — Progress Notes (Signed)
04/17/2021     CHIEF COMPLAINT Patient presents for Retina Follow Up (8 week fu OU, OCT and PRP OD/Pt states, "My va has not changed much that I can tell but I am still having a lot of blurry va OD."/A1C: 6.6/LBS: 150/)   HISTORY OF PRESENT ILLNESS: [name] is a 80 y.o. female who presents to the clinic today for:   HPI    Retina Follow Up    Diagnosis: Wet AMD   Laterality: both eyes   Onset: 8 weeks ago   Severity: mild   Duration: 8 weeks   Course: stable   Comments: 8 week fu OU, OCT and PRP OD Pt states, "My va has not changed much that I can tell but I am still having a lot of blurry va OD." A1C: 6.6 LBS: 150           Comments    OD with severe NPDR, peripheral retinal nonperfusion been stabilized with Avastin in the recent past, due to treatment for exudative ARMD.  Will deliver anterior PRP today to prevent progression to PDR in the future       Last edited by Hurman Horn, MD on 04/17/2021  9:56 AM. (History)      Referring physician: Alcus Dad, MD [place],  [place] [address]  HISTORICAL INFORMATION:   Selected notes from the MEDICAL RECORD NUMBER    Lab Results  Component Value Date   HGBA1C 7.3 (H) 03/24/2021     CURRENT MEDICATIONS: No current outpatient medications on file. (Ophthalmic Drugs)   No current facility-administered medications for this visit. (Ophthalmic Drugs)   Current Outpatient Medications (Other)  Medication Sig  . acetaminophen (TYLENOL 8 HOUR) 650 MG CR tablet Take 1 tablet (650 mg total) by mouth every 8 (eight) hours as needed for pain.  Marland Kitchen apixaban (ELIQUIS) 5 MG TABS tablet Take 1 tablet (5 mg total) by mouth 2 (two) times daily.  Marland Kitchen atorvastatin (LIPITOR) 40 MG tablet TAKE 1 TABLET BY MOUTH  DAILY AT 6 PM. (Patient taking differently: Take 40 mg by mouth daily at 6 PM.)  . Blood Glucose Monitoring Suppl (ONE TOUCH ULTRA 2) w/Device KIT USE TO CHECK BLOOD SUGAR UPTO 3 TIMES A DAY  . carvedilol  (COREG) 6.25 MG tablet TAKE 1 TABLET BY MOUTH  TWICE DAILY WITH A MEAL (Patient taking differently: Take 6.25 mg by mouth 2 (two) times daily with a meal.)  . cholecalciferol (VITAMIN D3) 25 MCG (1000 UNIT) tablet Take 1,000 Units by mouth daily.  . DULoxetine (CYMBALTA) 20 MG capsule Take 20 mg by mouth daily.  . empagliflozin (JARDIANCE) 10 MG TABS tablet Take 1 tablet (10 mg total) by mouth daily before breakfast.  . ferrous sulfate 325 (65 FE) MG tablet TAKE 1 TABLET BY MOUTH EVERY DAY WITH BREAKFAST (Patient taking differently: Take 325 mg by mouth daily with breakfast.)  . glucose blood (ONE TOUCH ULTRA TEST) test strip Use three times a day  . Lancets (ONETOUCH DELICA PLUS [id number]) MISC 1 application by Does not apply route daily. Please use to check blood sugar up to three times daily.  . nitroGLYCERIN (NITROSTAT) 0.4 MG SL tablet PLACE 1 TABLET (0.4 MG TOTAL) UNDER THE TONGUE EVERY 5 (FIVE) MINUTES AS NEEDED FOR CHEST PAIN (UP TO 3 DOSES).  . potassium chloride SA (KLOR-CON) 20 MEQ tablet Take 3 tablets (60 mEq total) by mouth daily.  Marland Kitchen rOPINIRole (REQUIP) 0.5 MG tablet Take 1 mg  by mouth at bedtime.  Marland Kitchen spironolactone (ALDACTONE) 25 MG tablet Take 0.5 tablets (12.5 mg total) by mouth daily.  Marland Kitchen spironolactone (ALDACTONE) 25 MG tablet Take 0.5 tablets (12.5 mg total) by mouth daily. (Patient not taking: No sig reported)  . torsemide (DEMADEX) 20 MG tablet Take 3 tablets (60 mg total) by mouth 2 (two) times daily. (Patient taking differently: Take 40-60 mg by mouth See admin instructions. Take 60 mg by mouth in the morning and 40 mg in the afternoon)  . torsemide (DEMADEX) 20 MG tablet Take 3 tablets (60 mg total) by mouth 2 (two) times daily. (Patient not taking: No sig reported)  . traMADol (ULTRAM) 50 MG tablet Take 1 tablet (50 mg total) by mouth every 6 (six) hours as needed.  . vitamin B-12 (CYANOCOBALAMIN) 1000 MCG tablet Take 1 tablet (1,000 mcg total) by mouth daily.   No current  facility-administered medications for this visit. (Other)      REVIEW OF SYSTEMS:    ALLERGIES Allergies  Allergen Reactions  . Benazepril Other (See Comments)    Unknown reaction at age 59-65 - possibly dizziness  . Ozempic (0.25 Or 0.5 Mg-Dose) [Semaglutide(0.25 Or 0.24m-Dos)] Diarrhea    Gi intollerance  . Tape     TOLERATES PAPER TAPE ONLY- due to thin skin. NOT ALLERGY PER PT  . Angiotensin Receptor Blockers Other (See Comments)    Hypotension reaction  . Fe-Succ-C-Thre-B12-Des Stomach Other (See Comments)    Unknown reaction  . Sulfamethoxazole-Trimethoprim Other (See Comments)    Unknown reaction    PAST MEDICAL HISTORY Past Medical History:  Diagnosis Date  . ACC/AHA stage C congestive heart failure due to ischemic cardiomyopathy ([facility] 01/31/2009   Qualifier: Diagnosis of  By: BOlevia Perches MD, FGlenetta Hew  . ANXIETY 01/31/2009   Qualifier: Diagnosis of  By: TLovette Cliche CNA, Christy    . Arthritis    "qwhere" (03/30/2016)  . Benign essential HTN   . CAD (coronary artery disease) 05/2008   a. s/p CABG in 2009.  .Marland KitchenCerebrovascular accident (CVA) due to embolism of right middle cerebral artery ([facility] [place]   . Chronic anticoagulation   . Chronic kidney disease (CKD), stage III (moderate) (HCC)   . Chronic lower back pain   . Chronic pain syndrome   . Chronic systolic CHF (congestive heart failure) ([facility]   . DM type 2 with diabetic peripheral neuropathy ([facility]   . Dysrhythmia    A Fib  . Facial weakness, post-stroke   . Gastrointestinal hemorrhage 03/26/2017  . GERD (gastroesophageal reflux disease)   . Hemiparesis ([facility]   . History of CVA with residual deficit 03/26/2017  . Hyperlipidemia   . Hypertension   . Ischemic cardiomyopathy   . Longstanding persistent atrial fibrillation (HH[facility]    a. Postoperative in 2009;  S/P transesophageal echocardiography-guided cardioversion, previously on Amiodarone and Coumadin. b. recurrent in April 2013 -> pt elected rate control strategy,  initially Coumadin -> had stroke with subtherapeutic INR in 03/2016 and transitioned to Eliquis.  . Macular degeneration   . Morbid obesity ([facility] 03/26/2017  . OSA on CPAP    severe OSA with AHI 34/hr  . Persistent atrial fibrillation (H[place]   . Thrombocytopenia ([place]    Past Surgical History:  Procedure Laterality Date  . APPENDECTOMY  1946  . BACK SURGERY    . CARPAL TUNNEL RELEASE Left 03/28/2021   Procedure: LEFT CARPAL TUNNEL RELEASE;  Surgeon: KDaryll Brod MD;  [facility]  Service: Orthopedics;  Laterality: Left;  60 MIN  . CATARACT EXTRACTION Right 2012  . COLONOSCOPY WITH PROPOFOL Left 03/27/2017   Procedure: COLONOSCOPY WITH PROPOFOL;  Surgeon: Ronnette Juniper, MD;  Location: [facility];  Service: Gastroenterology;  Laterality: Left;  . CORONARY ANGIOPLASTY WITH STENT PLACEMENT  2009   "put 2 stents in"  . EXCISION METACARPAL MASS Left 03/28/2021   Procedure: EXCISION MASS LEFT THUMB;  Surgeon: Daryll Brod, MD;  Location: [facility];  Service: Orthopedics;  Laterality: Left;  . EYE SURGERY     bilateral cataract  . HERNIA REPAIR    . IR LUMBAR [place] W/IMG GUIDE  10/08/2018  . JOINT REPLACEMENT    . TEE WITH CARDIOVERSION     Postoperative in 2009;  S/P transesophageal echocardiography-guided cardioversion,  . TOTAL KNEE ARTHROPLASTY Right 1994  . VENTRAL HERNIA REPAIR  10/1999   With mesh    FAMILY HISTORY Family History  Problem Relation Age of Onset  . Clotting disorder Mother        Cerebral hemorrhage  . Other Mother        cerebral hemorrhage  . Emphysema Father        COD  . Lung cancer Brother   . Heart disease Neg Hx     SOCIAL HISTORY Social History   Tobacco Use  . Smoking status: Never Smoker  . Smokeless tobacco: Never Used  Vaping Use  . Vaping Use: Never used  Substance Use Topics  . Alcohol use: No  . Drug use: Never         OPHTHALMIC EXAM:  Base Eye Exam    Visual Acuity (ETDRS)      Right Left   Dist cc 20/80 20/30    Dist ph cc 20/70 -1 NI       Tonometry (Tonopen, 9:26 AM)      Right Left   Pressure 11 09       Pupils      Pupils Dark Light Shape React APD   Right PERRL 3 2 Round Brisk None   Left PERRL 3 2 Round Brisk None       Visual Fields (Counting fingers)      Left Right    Full Full       Extraocular Movement      Right Left    Full Full       Neuro/Psych    Oriented x3: Yes   Mood/Affect: Normal       Dilation    Both eyes: 1.0% Mydriacyl, 2.5% Phenylephrine @ 9:26 AM        Slit Lamp and Fundus Exam    External Exam      Right Left   External Normal Normal       Slit Lamp Exam      Right Left   Lids/Lashes Normal Normal   Conjunctiva/Sclera White and quiet White and quiet   Cornea Clear Clear   Anterior Chamber Deep and quiet Deep and quiet   Iris Round and reactive Round and reactive   Lens Posterior chamber intraocular lens Posterior chamber intraocular lens   Anterior Vitreous Normal Normal       Fundus Exam      Right Left   Posterior Vitreous Posterior vitreous detachment Partial posterior vitreous detachment   Disc Normal Normal   C/D Ratio 0.7 0.7   Macula Soft drusen, Retinal pigment epithelial mottling, no macular thickening, geographic atrophy superior portion of the macula Intermediate age related macular degeneration, Hard drusen, Pigmented  atrophy   Vessels NPDR-Severe, with arm inferiorly NPDR-Severe   Periphery Normal Normal          IMAGING AND PROCEDURES  Imaging and Procedures for 04/17/21  OCT, Retina - OU - Both Eyes       Right Eye Quality was good. Scan locations included subfoveal. Central Foveal Thickness: 222. Progression has improved. Findings include abnormal foveal contour.   Left Eye Quality was good. Scan locations included subfoveal. Central Foveal Thickness: 296. Progression has been stable. Findings include abnormal foveal contour, vitreous traction, vitreomacular adhesion .   Notes     OD also with known  severe NPDR controlled on intravitreal Avastin, now 7-week interval       Panretinal Photocoagulation - OD - Right Eye       Anesthesia Topical anesthesia was used. Anesthetic medications included Proparacaine 0.5%.   Laser Information The type of laser was diode. Color was yellow. The duration in seconds was 0.03. The spot size was 390 microns. Laser power was 320. Total spots was 724.   Post-op The patient tolerated the procedure well. There were no complications. The patient received written and verbal post procedure care education.   Notes PRP delivered superior and nasally.  Some difficulty with positioning because of the high Abrol.  With chin tucked down and slightly away from the forehead rest allow for access superior                ASSESSMENT/PLAN:  Severe nonproliferative diabetic retinopathy of left eye ([place]) The nature of severe nonproliferative diabetic retinopathy discussed with the patient as well as the need for more frequent follow up and likely progression to proliferative disease in the near future. The options of continued observation versus panretinal photocoagulation at this time were reviewed as well as the risks, benefits, and alternatives. More recent option includes the use of ocular injectable medications to slow progression of retinal disease. Tight control of glucose, blood pressure, and serum lipid levels were recommended under the direction of general physician or endocrinologist, as well as avoidance of smoking and maintenance of normal body weight. The 2-year risk of progression to proliferative diabetic retinopathy is 60%.  Severe peripheral retinal nonperfusion.  We will schedule PRP #1 OS in the near future  Vitreous hemorrhage of right eye (HCC) Vitreous hemorrhage OD makes this right eye with early proliferative diabetic retinopathy probably quiescent in the past as induced by Avastin for exudative ARMD.  Plan PRP OD today to prevent  progression of PDR serendipitously on time as early PDR now present in the right eye  Severe nonproliferative diabetic retinopathy of right eye ([place]) Early PDR found today with small vitreous hemorrhage at planned treatment for severe NPDR progression.  Will need completion of PRP in the near future  Proliferative diabetic retinopathy of right eye ([place]) PRP #1 OD today      ICD-10-CM   1. Severe nonproliferative diabetic retinopathy of right eye without macular edema associated with type 2 diabetes mellitus ([place])  Z61.0960 Panretinal Photocoagulation - OD - Right Eye  2. Exudative age-related macular degeneration of right eye with active choroidal neovascularization (HCC)  H35.3211 OCT, Retina - OU - Both Eyes  3. Severe nonproliferative diabetic retinopathy of left eye without macular edema associated with type 2 diabetes mellitus ([place])  A54.0981   4. Vitreous hemorrhage of right eye ([place])  H43.11   5. Proliferative diabetic retinopathy of right eye without macular edema associated with type 2 diabetes mellitus ([place])  X91.4782  1.  PRP OD #1 delivered today what was thought to be severe NPDR but at the time of delivery found have a small vitreous hemorrhage thus early PDR was found to be present today.  Will need completion of PRP in 2 weeks OD  2.  Dilate OD next, PRP temporal and inferiorly OD next  Attention to the high brow right eye which required some tipping of the forehead posteriorly from the machine so as to have access to the fundus via wide-field lens  3.  We will schedule PRP #1 OS in 4 to 5 weeks  Ophthalmic Meds Ordered this visit:  No orders of the defined types were placed in this encounter.      Return in about 2 weeks (around 05/01/2021) for dilate, OD, PRP,,, and in future dilate OS, PRP #1 , 4 to 6 weeks.  There are no Patient Instructions on file for this visit.   Explained the diagnoses, plan, and follow up with the patient and they expressed  understanding.  Patient expressed understanding of the importance of proper follow up care.   Clent Demark Uvaldo Rybacki M.D. Diseases & Surgery of the Retina and Vitreous Retina & Diabetic [place] 04/17/21     Abbreviations: M myopia (nearsighted); A astigmatism; H hyperopia (farsighted); P presbyopia; Mrx spectacle prescription;  CTL contact lenses; OD right eye; OS left eye; OU both eyes  XT exotropia; ET esotropia; PEK punctate epithelial keratitis; PEE punctate epithelial erosions; DES dry eye syndrome; MGD meibomian gland dysfunction; ATs artificial tears; PFAT's preservative free artificial tears; [place] nuclear sclerotic cataract; PSC posterior subcapsular cataract; ERM epi-retinal membrane; PVD posterior vitreous detachment; RD retinal detachment; DM diabetes mellitus; DR diabetic retinopathy; NPDR non-proliferative diabetic retinopathy; PDR proliferative diabetic retinopathy; CSME clinically significant macular edema; DME diabetic macular edema; dbh dot blot hemorrhages; CWS cotton wool spot; POAG primary open angle glaucoma; C/D cup-to-disc ratio; HVF humphrey visual field; GVF goldmann visual field; OCT optical coherence tomography; IOP intraocular pressure; BRVO Branch retinal vein occlusion; CRVO central retinal vein occlusion; CRAO central retinal artery occlusion; BRAO branch retinal artery occlusion; RT retinal tear; SB scleral buckle; PPV pars plana vitrectomy; VH Vitreous hemorrhage; PRP panretinal laser photocoagulation; IVK intravitreal kenalog; VMT vitreomacular traction; MH Macular hole;  NVD neovascularization of the disc; NVE neovascularization elsewhere; AREDS age related eye disease study; ARMD age related macular degeneration; POAG primary open angle glaucoma; EBMD epithelial/anterior basement membrane dystrophy; ACIOL anterior chamber intraocular lens; IOL intraocular lens; PCIOL posterior chamber intraocular lens; Phaco/IOL phacoemulsification with intraocular lens placement; [place]  photorefractive keratectomy; LASIK laser assisted in situ keratomileusis; HTN hypertension; DM diabetes mellitus; COPD chronic obstructive pulmonary disease

## 2021-04-17 NOTE — Assessment & Plan Note (Signed)
PRP #1 OD today

## 2021-04-17 NOTE — Assessment & Plan Note (Addendum)
>>  ASSESSMENT AND PLAN FOR SEVERE NONPROLIFERATIVE DIABETIC RETINOPATHY OF BOTH EYES (HCC) WRITTEN ON 04/17/2021 10:14 AM BY Fawn Kirk A, MD  Early PDR found today with small vitreous hemorrhage at planned treatment for severe NPDR progression.  Will need completion of PRP in the near future  >>ASSESSMENT AND PLAN FOR SEVERE NONPROLIFERATIVE DIABETIC RETINOPATHY OF LEFT EYE (HCC) WRITTEN ON 04/17/2021 10:15 AM BY Edmon Crape, MD  The nature of severe nonproliferative diabetic retinopathy discussed with the patient as well as the need for more frequent follow up and likely progression to proliferative disease in the near future. The options of continued observation versus panretinal photocoagulation at this time were reviewed as well as the risks, benefits, and alternatives. More recent option includes the use of ocular injectable medications to slow progression of retinal disease. Tight control of glucose, blood pressure, and serum lipid levels were recommended under the direction of general physician or endocrinologist, as well as avoidance of smoking and maintenance of normal body weight. The 2-year risk of progression to proliferative diabetic retinopathy is 60%.  Severe peripheral retinal nonperfusion.  We will schedule PRP #1 OS in the near future

## 2021-04-17 NOTE — Assessment & Plan Note (Addendum)
The nature of severe nonproliferative diabetic retinopathy discussed with the patient as well as the need for more frequent follow up and likely progression to proliferative disease in the near future. The options of continued observation versus panretinal photocoagulation at this time were reviewed as well as the risks, benefits, and alternatives. More recent option includes the use of ocular injectable medications to slow progression of retinal disease. Tight control of glucose, blood pressure, and serum lipid levels were recommended under the direction of general physician or endocrinologist, as well as avoidance of smoking and maintenance of normal body weight. The 2-year risk of progression to proliferative diabetic retinopathy is 60%.  Severe peripheral retinal nonperfusion.  We will schedule PRP #1 OS in the near future

## 2021-04-17 NOTE — Assessment & Plan Note (Signed)
Vitreous hemorrhage OD makes this right eye with early proliferative diabetic retinopathy probably quiescent in the past as induced by Avastin for exudative ARMD.  Plan PRP OD today to prevent progression of PDR serendipitously on time as early PDR now present in the right eye

## 2021-05-01 ENCOUNTER — Encounter (INDEPENDENT_AMBULATORY_CARE_PROVIDER_SITE_OTHER): Payer: Medicare HMO | Admitting: Ophthalmology

## 2021-05-08 ENCOUNTER — Encounter: Payer: Self-pay | Admitting: Podiatry

## 2021-05-08 ENCOUNTER — Ambulatory Visit: Payer: Medicare HMO | Admitting: Podiatry

## 2021-05-08 ENCOUNTER — Other Ambulatory Visit: Payer: Self-pay

## 2021-05-08 DIAGNOSIS — B351 Tinea unguium: Secondary | ICD-10-CM

## 2021-05-08 DIAGNOSIS — Q458 Other specified congenital malformations of digestive system: Secondary | ICD-10-CM

## 2021-05-08 DIAGNOSIS — R634 Abnormal weight loss: Secondary | ICD-10-CM | POA: Insufficient documentation

## 2021-05-08 DIAGNOSIS — E1169 Type 2 diabetes mellitus with other specified complication: Secondary | ICD-10-CM | POA: Diagnosis not present

## 2021-05-08 DIAGNOSIS — Z862 Personal history of diseases of the blood and blood-forming organs and certain disorders involving the immune mechanism: Secondary | ICD-10-CM | POA: Insufficient documentation

## 2021-05-08 DIAGNOSIS — Q438 Other specified congenital malformations of intestine: Secondary | ICD-10-CM

## 2021-05-08 DIAGNOSIS — M2012 Hallux valgus (acquired), left foot: Secondary | ICD-10-CM | POA: Diagnosis not present

## 2021-05-08 DIAGNOSIS — K5731 Diverticulosis of large intestine without perforation or abscess with bleeding: Secondary | ICD-10-CM | POA: Insufficient documentation

## 2021-05-08 DIAGNOSIS — M2041 Other hammer toe(s) (acquired), right foot: Secondary | ICD-10-CM | POA: Diagnosis not present

## 2021-05-08 DIAGNOSIS — E1151 Type 2 diabetes mellitus with diabetic peripheral angiopathy without gangrene: Secondary | ICD-10-CM | POA: Diagnosis not present

## 2021-05-08 DIAGNOSIS — M2011 Hallux valgus (acquired), right foot: Secondary | ICD-10-CM | POA: Diagnosis not present

## 2021-05-08 DIAGNOSIS — M2042 Other hammer toe(s) (acquired), left foot: Secondary | ICD-10-CM | POA: Diagnosis not present

## 2021-05-08 HISTORY — DX: Other specified congenital malformations of intestine: Q43.8

## 2021-05-08 HISTORY — DX: Other specified congenital malformations of digestive system: Q45.8

## 2021-05-08 NOTE — Progress Notes (Signed)
  Subjective:  Patient ID: [name], female    DOB: 1941-05-24,  MRN: [id number]  Chief Complaint  Patient presents with   Callouses    Trim some calluses   Nail Problem    Trim nails    80 y.o. female presents with the above complaint. History confirmed with patient.   Objective:  Physical Exam: warm, good capillary refill, nail exam onychomycosis of the toenails, no trophic changes or ulcerative lesions, normal DP and non-palp PT pulses, and normal sensory exam. Left Foot: bunion deformity noted and hammertoes   Right Foot: bunion deformity noted and hammertoes    No images are attached to the encounter.  Assessment:   1. Onychomycosis of multiple toenails with type 2 diabetes mellitus and peripheral angiopathy ([place])     Plan:  Patient was evaluated and treated and all questions answered.  Onychomycosis, Diabetes and PAD -DM shoes dispensed. -Nails debrided x10  Procedure: Nail Debridement Type of Debridement: manual, sharp debridement. Instrumentation: Nail nipper, rotary burr. Number of Nails: 10  Return in about 3 months (around 08/08/2021) for Diabetic Foot Care.

## 2021-05-16 ENCOUNTER — Encounter (INDEPENDENT_AMBULATORY_CARE_PROVIDER_SITE_OTHER): Payer: Self-pay | Admitting: Ophthalmology

## 2021-05-16 ENCOUNTER — Other Ambulatory Visit: Payer: Self-pay

## 2021-05-16 ENCOUNTER — Ambulatory Visit (INDEPENDENT_AMBULATORY_CARE_PROVIDER_SITE_OTHER): Payer: Medicare HMO | Admitting: Ophthalmology

## 2021-05-16 DIAGNOSIS — E113591 Type 2 diabetes mellitus with proliferative diabetic retinopathy without macular edema, right eye: Secondary | ICD-10-CM | POA: Diagnosis not present

## 2021-05-16 DIAGNOSIS — E113492 Type 2 diabetes mellitus with severe nonproliferative diabetic retinopathy without macular edema, left eye: Secondary | ICD-10-CM

## 2021-05-16 DIAGNOSIS — H4311 Vitreous hemorrhage, right eye: Secondary | ICD-10-CM | POA: Diagnosis not present

## 2021-05-16 DIAGNOSIS — E113491 Type 2 diabetes mellitus with severe nonproliferative diabetic retinopathy without macular edema, right eye: Secondary | ICD-10-CM

## 2021-05-16 NOTE — Assessment & Plan Note (Signed)
Small no change in size since PRP #1, will continue to monitor

## 2021-05-16 NOTE — Assessment & Plan Note (Signed)
>>  ASSESSMENT AND PLAN FOR SEVERE NONPROLIFERATIVE DIABETIC RETINOPATHY OF LEFT EYE (HCC) WRITTEN ON 05/16/2021  3:56 PM BY Fawn Kirk A, MD  High risk for progression of PDR will need PRP in the near future

## 2021-05-16 NOTE — Assessment & Plan Note (Signed)
High risk for progression of PDR will need PRP in the near future

## 2021-05-16 NOTE — Progress Notes (Signed)
05/16/2021     CHIEF COMPLAINT Patient presents for No chief complaint on file.   HISTORY OF PRESENT ILLNESS: [name] is a 80 y.o. female who presents to the clinic today for:     Referring physician: Alcus Dad, [place],  [place] [id number]  HISTORICAL INFORMATION:   Selected notes from the MEDICAL RECORD NUMBER    Lab Results  Component Value Date   HGBA1C 7.3 (H) 03/24/2021     CURRENT MEDICATIONS: Current Outpatient Medications (Ophthalmic Drugs)  Medication Sig   Neomycin-Bacitracin Zn-Polymyx [phone] OINT 1 application   No current facility-administered medications for this visit. (Ophthalmic Drugs)   Current Outpatient Medications (Other)  Medication Sig   acetaminophen (TYLENOL 8 HOUR) 650 MG CR tablet Take 1 tablet (650 mg total) by mouth every 8 (eight) hours as needed for pain.   apixaban (ELIQUIS) 5 MG TABS tablet Take 1 tablet (5 mg total) by mouth 2 (two) times daily.   atorvastatin (LIPITOR) 40 MG tablet TAKE 1 TABLET BY MOUTH  DAILY AT 6 PM. (Patient taking differently: Take 40 mg by mouth daily at 6 PM.)   Blood Glucose Monitoring Suppl (ONE TOUCH ULTRA 2) w/Device KIT USE TO CHECK BLOOD SUGAR UPTO 3 TIMES A DAY   carvedilol (COREG) 6.25 MG tablet TAKE 1 TABLET BY MOUTH  TWICE DAILY WITH A MEAL (Patient taking differently: Take 6.25 mg by mouth 2 (two) times daily with a meal.)   cholecalciferol (VITAMIN D3) 25 MCG (1000 UNIT) tablet Take 1,000 Units by mouth daily.   docusate sodium (COLACE) 100 MG capsule 1 capsule as needed   DULoxetine (CYMBALTA) 20 MG capsule Take 20 mg by mouth daily.   empagliflozin (JARDIANCE) 10 MG TABS tablet Take 1 tablet (10 mg total) by mouth daily before breakfast.   ferrous sulfate 325 (65 FE) MG tablet TAKE 1 TABLET BY MOUTH EVERY DAY WITH BREAKFAST (Patient taking differently: Take 325 mg by mouth daily with breakfast.)   gabapentin (NEURONTIN) 400 MG capsule TK 1 C PO BID   glucose blood  (ONE TOUCH ULTRA TEST) test strip Use three times a day   insulin glargine (LANTUS) 100 UNIT/ML injection See admin instructions.   Lancets (ONETOUCH DELICA PLUS [id number]) MISC 1 application by Does not apply route daily. Please use to check blood sugar up to three times daily.   losartan (COZAAR) 25 MG tablet 1/2 tablet   metolazone (ZAROXOLYN) 2.5 MG tablet 1 tablet   nitroGLYCERIN (NITROSTAT) 0.4 MG SL tablet PLACE 1 TABLET (0.4 MG TOTAL) UNDER THE TONGUE EVERY 5 (FIVE) MINUTES AS NEEDED FOR CHEST PAIN (UP TO 3 DOSES).   potassium chloride (KLOR-CON) 20 MEQ packet 2 tablets   potassium chloride SA (KLOR-CON) 20 MEQ tablet Take 3 tablets (60 mEq total) by mouth daily.   rOPINIRole (REQUIP) 0.5 MG tablet Take 1 mg by mouth at bedtime.   silver sulfADIAZINE (SILVADENE) 1 % cream 1 application to affected area   spironolactone (ALDACTONE) 25 MG tablet Take 0.5 tablets (12.5 mg total) by mouth daily.   spironolactone (ALDACTONE) 25 MG tablet Take 0.5 tablets (12.5 mg total) by mouth daily. (Patient not taking: No sig reported)   torsemide (DEMADEX) 20 MG tablet Take 3 tablets (60 mg total) by mouth 2 (two) times daily. (Patient taking differently: Take 40-60 mg by mouth See admin instructions. Take 60 mg by mouth in the morning and 40 mg in the afternoon)   torsemide (DEMADEX) 20 MG  tablet Take 3 tablets (60 mg total) by mouth 2 (two) times daily. (Patient not taking: No sig reported)   traMADol (ULTRAM) 50 MG tablet Take 1 tablet (50 mg total) by mouth every 6 (six) hours as needed.   vitamin B-12 (CYANOCOBALAMIN) 100 MCG tablet See admin instructions.   vitamin B-12 (CYANOCOBALAMIN) 1000 MCG tablet Take 1 tablet (1,000 mcg total) by mouth daily.   No current facility-administered medications for this visit. (Other)      REVIEW OF SYSTEMS:    ALLERGIES Allergies  Allergen Reactions   Benazepril Other (See Comments)    Unknown reaction at age 51-65 - possibly dizziness   Ozempic (0.25  Or 0.5 Mg-Dose) [Semaglutide(0.25 Or 0.19m-Dos)] Diarrhea    Gi intollerance   Tape     TOLERATES PAPER TAPE ONLY- due to thin skin. NOT ALLERGY PER PT   Angiotensin Receptor Blockers Other (See Comments)    Hypotension reaction   Fe-Succ-C-Thre-B12-Des Stomach Other (See Comments)    Unknown reaction   Sulfamethoxazole-Trimethoprim Other (See Comments)    Unknown reaction    PAST MEDICAL HISTORY Past Medical History:  Diagnosis Date   ACC/AHA stage C congestive heart failure due to ischemic cardiomyopathy ([facility] 01/31/2009   Qualifier: Diagnosis of  By: BOlevia Perches MD, FGlenetta Hew   ANXIETY 01/31/2009   Qualifier: Diagnosis of  By: TLovette Cliche CNA, Christy     Arthritis    "qwhere" (03/30/2016)   Benign essential HTN    CAD (coronary artery disease) 05/2008   a. s/p CABG in 2009.   Cerebrovascular accident (CVA) due to embolism of right middle cerebral artery (HCC)    Chronic anticoagulation    Chronic kidney disease (CKD), stage III (moderate) (HCC)    Chronic lower back pain    Chronic pain syndrome    Chronic systolic CHF (congestive heart failure) ([facility]    DM type 2 with diabetic peripheral neuropathy (HCC)    Dysrhythmia    A Fib   Facial weakness, post-stroke    Gastrointestinal hemorrhage 03/26/2017   GERD (gastroesophageal reflux disease)    Hemiparesis (HCC)    History of CVA with residual deficit 03/26/2017   Hyperlipidemia    Hypertension    Ischemic cardiomyopathy    Longstanding persistent atrial fibrillation ([facility]    a. Postoperative in 2009;  S/P transesophageal echocardiography-guided cardioversion, previously on Amiodarone and Coumadin. b. recurrent in April 2013 -> pt elected rate control strategy, initially Coumadin -> had stroke with subtherapeutic INR in 03/2016 and transitioned to Eliquis.   Macular degeneration    Morbid obesity ([facility] 03/26/2017   OSA on CPAP    severe OSA with AHI 34/hr   Persistent atrial fibrillation (HCC)    Thrombocytopenia ([facility]     Past Surgical History:  Procedure Laterality Date   APPENDECTOMY  1946   BACK SURGERY     CARPAL TUNNEL RELEASE Left 03/28/2021   Procedure: LEFT CARPAL TUNNEL RELEASE;  Surgeon: KDaryll Brod MD;  [facility]  Service: Orthopedics;  Laterality: Left;  60 MIN   CATARACT EXTRACTION Right 2012   COLONOSCOPY WITH PROPOFOL Left 03/27/2017   Procedure: COLONOSCOPY WITH PROPOFOL;  Surgeon: KRonnette Juniper MD;  [facility]  Service: Gastroenterology;  Laterality: Left;   CORONARY ANGIOPLASTY WITH STENT PLACEMENT  2009   "put 2 stents in"   EXCISION METACARPAL MASS Left 03/28/2021   Procedure: EXCISION MASS LEFT THUMB;  Surgeon: KDaryll Brod MD;  [facility] [facility]  Service: Orthopedics;  Laterality: Left;  EYE SURGERY     bilateral cataract   HERNIA REPAIR     IR LUMBAR DISC ASPIRATION W/IMG GUIDE  10/08/2018   JOINT REPLACEMENT     TEE WITH CARDIOVERSION     Postoperative in 2009;  S/P transesophageal echocardiography-guided cardioversion,   TOTAL KNEE ARTHROPLASTY Right 1994   VENTRAL HERNIA REPAIR  10/1999   With mesh    FAMILY HISTORY Family History  Problem Relation Age of Onset   Clotting disorder Mother        Cerebral hemorrhage   Other Mother        cerebral hemorrhage   Emphysema Father        COD   Lung cancer Brother    Heart disease Neg Hx     SOCIAL HISTORY Social History   Tobacco Use   Smoking status: Never   Smokeless tobacco: Never  Vaping Use   Vaping Use: Never used  Substance Use Topics   Alcohol use: No   Drug use: Never         OPHTHALMIC EXAM:  Base Eye Exam     Visual Acuity (ETDRS)       Right Left   Dist [place] 20/50 -1 20/30         Tonometry (Tonopen, 3:28 PM)       Right Left   Pressure 10 7         Pupils       Pupils React APD   Right PERRL Slow None   Left PERRL Slow None         Visual Fields       Left Right    Full Full         Neuro/Psych     Oriented x3: Yes   Mood/Affect: Normal            Slit Lamp and Fundus Exam     External Exam       Right Left   External Normal Normal         Slit Lamp Exam       Right Left   Lids/Lashes Normal Normal   Conjunctiva/Sclera White and quiet White and quiet   Cornea Clear Clear   Anterior Chamber Deep and quiet Deep and quiet   Iris Round and reactive Round and reactive   Lens Posterior chamber intraocular lens Posterior chamber intraocular lens   Anterior Vitreous Normal Normal         Fundus Exam       Right Left   Posterior Vitreous Posterior vitreous detachment small vitreous hemorrhage superiorly, Vitreous hemorrhage Partial posterior vitreous detachment   Disc Normal Normal   C/D Ratio 0.7 0.7   Macula Soft drusen, Retinal pigment epithelial mottling, no macular thickening, geographic atrophy superior portion of the macula Intermediate age related macular degeneration, Hard drusen, Pigmented atrophy   Vessels NPDR-Severe, with arm inferiorly NPDR-Severe   Periphery Normal Normal            IMAGING AND PROCEDURES  Imaging and Procedures for 05/16/21  Color Fundus Photography Optos - OU - Both Eyes       Right Eye Progression has been stable. Macula : microaneurysms.   Left Eye Progression has been stable. Macula : microaneurysms.   Notes Good PRP OD nasally clear media.  Areas of extensive retinal nonperfusion temporally and superiorly PRP OD today, for severe NPDR  OS with very severe NPDR clear media     Panretinal Photocoagulation - OD -  Right Eye       Time Out Confirmed correct patient, procedure, site, and patient consented.   Anesthesia Topical anesthesia was used. Anesthetic medications included Proparacaine 0.5%.   Laser Information The type of laser was diode. Color was yellow. The duration in seconds was 0.03. The spot size was 390 microns. Laser power was 520. Total spots was 710.   Post-op The patient tolerated the procedure well. There were no complications. The  patient received written and verbal post procedure care education.   Notes PRP delivered   Some difficulty with positioning because of the high  With chin tucked down and slightly away from the forehead rest allow for access superior             ASSESSMENT/PLAN:  Proliferative diabetic retinopathy of right eye (HCC) PRP #2 delivered today  Severe nonproliferative diabetic retinopathy of left eye (HCC) High risk for progression of PDR will need PRP in the near future  Vitreous hemorrhage of right eye ([place]) Small no change in size since PRP #1, will continue to monitor     ICD-10-CM   1. Proliferative diabetic retinopathy of right eye without macular edema associated with type 2 diabetes mellitus (HCC)  K53.9767 Color Fundus Photography Optos - OU - Both Eyes    Panretinal Photocoagulation - OD - Right Eye    2. Severe nonproliferative diabetic retinopathy of left eye without macular edema associated with type 2 diabetes mellitus ([place])  H41.9379     3. Severe nonproliferative diabetic retinopathy of right eye without macular edema associated with type 2 diabetes mellitus ([place])  E11.3491     4. Vitreous hemorrhage of right eye (HCC)  H43.11       1.  Laser PRP #2 completed today superotemporal and temporal for PDR with vitreous hemorrhage superiorly.  2.  Dilate OS next July 11 as scheduled for PRP for severe NPDR with extensive retinal nonperfusion  3.  Ophthalmic Meds Ordered this visit:  No orders of the defined types were placed in this encounter.      Return in about 3 weeks (around 06/06/2021) for dilate, OS, PRP.  See notes above for PRP #1 OS  There are no Patient Instructions on file for this visit.   Explained the diagnoses, plan, and follow up with the patient and they expressed understanding.  Patient expressed understanding of the importance of proper follow up care.   Clent Demark Abiha Lukehart M.D. Diseases & Surgery of the Retina and Vitreous Retina &  Diabetic [place] 05/16/21     Abbreviations: M myopia (nearsighted); A astigmatism; H hyperopia (farsighted); P presbyopia; Mrx spectacle prescription;  CTL contact lenses; OD right eye; OS left eye; OU both eyes  XT exotropia; ET esotropia; PEK punctate epithelial keratitis; PEE punctate epithelial erosions; DES dry eye syndrome; MGD meibomian gland dysfunction; ATs artificial tears; PFAT's preservative free artificial tears; [place] nuclear sclerotic cataract; PSC posterior subcapsular cataract; ERM epi-retinal membrane; PVD posterior vitreous detachment; RD retinal detachment; DM diabetes mellitus; DR diabetic retinopathy; NPDR non-proliferative diabetic retinopathy; PDR proliferative diabetic retinopathy; CSME clinically significant macular edema; DME diabetic macular edema; dbh dot blot hemorrhages; CWS cotton wool spot; POAG primary open angle glaucoma; C/D cup-to-disc ratio; HVF humphrey visual field; GVF goldmann visual field; OCT optical coherence tomography; IOP intraocular pressure; BRVO Branch retinal vein occlusion; CRVO central retinal vein occlusion; CRAO central retinal artery occlusion; BRAO branch retinal artery occlusion; RT retinal tear; SB scleral buckle; PPV pars plana vitrectomy; VH Vitreous hemorrhage; PRP  panretinal laser photocoagulation; IVK intravitreal kenalog; VMT vitreomacular traction; MH Macular hole;  NVD neovascularization of the disc; NVE neovascularization elsewhere; AREDS age related eye disease study; ARMD age related macular degeneration; POAG primary open angle glaucoma; EBMD epithelial/anterior basement membrane dystrophy; ACIOL anterior chamber intraocular lens; IOL intraocular lens; PCIOL posterior chamber intraocular lens; Phaco/IOL phacoemulsification with intraocular lens placement; [place] photorefractive keratectomy; LASIK laser assisted in situ keratomileusis; HTN hypertension; DM diabetes mellitus; COPD chronic obstructive pulmonary disease

## 2021-05-16 NOTE — Assessment & Plan Note (Signed)
PRP #2 delivered today

## 2021-05-22 ENCOUNTER — Encounter (INDEPENDENT_AMBULATORY_CARE_PROVIDER_SITE_OTHER): Payer: Self-pay | Admitting: Ophthalmology

## 2021-05-22 ENCOUNTER — Other Ambulatory Visit: Payer: Self-pay

## 2021-05-22 ENCOUNTER — Other Ambulatory Visit (HOSPITAL_COMMUNITY): Payer: Self-pay | Admitting: *Deleted

## 2021-05-22 ENCOUNTER — Encounter (INDEPENDENT_AMBULATORY_CARE_PROVIDER_SITE_OTHER): Payer: Medicare HMO | Admitting: Ophthalmology

## 2021-05-22 ENCOUNTER — Ambulatory Visit (INDEPENDENT_AMBULATORY_CARE_PROVIDER_SITE_OTHER): Payer: Medicare HMO | Admitting: Ophthalmology

## 2021-05-22 DIAGNOSIS — E113492 Type 2 diabetes mellitus with severe nonproliferative diabetic retinopathy without macular edema, left eye: Secondary | ICD-10-CM | POA: Diagnosis not present

## 2021-05-22 MED ORDER — METOLAZONE 2.5 MG PO TABS: ORAL_TABLET | ORAL | 0 refills | Status: DC

## 2021-05-22 NOTE — Assessment & Plan Note (Signed)
Severe NPDR, with peripheral nonperfusion PRP #1 today anterior retina in order to slow and prevent progression of PDR

## 2021-05-22 NOTE — Assessment & Plan Note (Signed)
>>  ASSESSMENT AND PLAN FOR SEVERE NONPROLIFERATIVE DIABETIC RETINOPATHY OF LEFT EYE (HCC) WRITTEN ON 05/22/2021  4:28 PM BY Fawn Kirk A, MD  Severe NPDR, with peripheral nonperfusion PRP #1 today anterior retina in order to slow and prevent progression of PDR

## 2021-05-22 NOTE — Progress Notes (Signed)
05/22/2021     CHIEF COMPLAINT Patient presents for Retina Follow Up (4-6 week PRP OS/Pt states VA OU stable since last visit. Pt denies FOL, floaters, or ocular pain OU. /LBS: 149//)   HISTORY OF PRESENT ILLNESS: [name] is a 80 y.o. female who presents to the clinic today for:   HPI     Retina Follow Up           Diagnosis: Diabetic Retinopathy   Laterality: left eye   Onset: 5 weeks ago   Severity: mild   Duration: 5 weeks   Course: stable   Comments: 4-6 week PRP OS Pt states VA OU stable since last visit. Pt denies FOL, floaters, or ocular pain OU.  LBS: 149         Last edited by Kendra Opitz, COA on 05/22/2021  3:42 PM.      Referring physician: Alcus Dad, [place],  [place] [id number]  HISTORICAL INFORMATION:   Selected notes from the MEDICAL RECORD NUMBER    Lab Results  Component Value Date   HGBA1C 7.3 (H) 03/24/2021     CURRENT MEDICATIONS: Current Outpatient Medications (Ophthalmic Drugs)  Medication Sig   Neomycin-Bacitracin Zn-Polymyx [id number] OINT 1 application   No current facility-administered medications for this visit. (Ophthalmic Drugs)   Current Outpatient Medications (Other)  Medication Sig   acetaminophen (TYLENOL 8 HOUR) 650 MG CR tablet Take 1 tablet (650 mg total) by mouth every 8 (eight) hours as needed for pain.   apixaban (ELIQUIS) 5 MG TABS tablet Take 1 tablet (5 mg total) by mouth 2 (two) times daily.   atorvastatin (LIPITOR) 40 MG tablet TAKE 1 TABLET BY MOUTH  DAILY AT 6 PM. (Patient taking differently: Take 40 mg by mouth daily at 6 PM.)   Blood Glucose Monitoring Suppl (ONE TOUCH ULTRA 2) w/Device KIT USE TO CHECK BLOOD SUGAR UPTO 3 TIMES A DAY   carvedilol (COREG) 6.25 MG tablet TAKE 1 TABLET BY MOUTH  TWICE DAILY WITH A MEAL (Patient taking differently: Take 6.25 mg by mouth 2 (two) times daily with a meal.)   cholecalciferol (VITAMIN D3) 25 MCG (1000 UNIT) tablet Take 1,000 Units by  mouth daily.   docusate sodium (COLACE) 100 MG capsule 1 capsule as needed   DULoxetine (CYMBALTA) 20 MG capsule Take 20 mg by mouth daily.   empagliflozin (JARDIANCE) 10 MG TABS tablet Take 1 tablet (10 mg total) by mouth daily before breakfast.   ferrous sulfate 325 (65 FE) MG tablet TAKE 1 TABLET BY MOUTH EVERY DAY WITH BREAKFAST (Patient taking differently: Take 325 mg by mouth daily with breakfast.)   gabapentin (NEURONTIN) 400 MG capsule TK 1 C PO BID   glucose blood (ONE TOUCH ULTRA TEST) test strip Use three times a day   insulin glargine (LANTUS) 100 UNIT/ML injection See admin instructions.   Lancets (ONETOUCH DELICA PLUS [id number]) MISC 1 application by Does not apply route daily. Please use to check blood sugar up to three times daily.   losartan (COZAAR) 25 MG tablet 1/2 tablet   metolazone (ZAROXOLYN) 2.5 MG tablet Take only as directed by CHF clinic   nitroGLYCERIN (NITROSTAT) 0.4 MG SL tablet PLACE 1 TABLET (0.4 MG TOTAL) UNDER THE TONGUE EVERY 5 (FIVE) MINUTES AS NEEDED FOR CHEST PAIN (UP TO 3 DOSES).   potassium chloride (KLOR-CON) 20 MEQ packet 2 tablets   potassium chloride SA (KLOR-CON) 20 MEQ tablet Take 3 tablets (60 mEq total)  by mouth daily.   rOPINIRole (REQUIP) 0.5 MG tablet Take 1 mg by mouth at bedtime.   silver sulfADIAZINE (SILVADENE) 1 % cream 1 application to affected area   spironolactone (ALDACTONE) 25 MG tablet Take 0.5 tablets (12.5 mg total) by mouth daily.   spironolactone (ALDACTONE) 25 MG tablet Take 0.5 tablets (12.5 mg total) by mouth daily. (Patient not taking: No sig reported)   torsemide (DEMADEX) 20 MG tablet Take 3 tablets (60 mg total) by mouth 2 (two) times daily. (Patient taking differently: Take 40-60 mg by mouth See admin instructions. Take 60 mg by mouth in the morning and 40 mg in the afternoon)   torsemide (DEMADEX) 20 MG tablet Take 3 tablets (60 mg total) by mouth 2 (two) times daily. (Patient not taking: No sig reported)   traMADol  (ULTRAM) 50 MG tablet Take 1 tablet (50 mg total) by mouth every 6 (six) hours as needed.   vitamin B-12 (CYANOCOBALAMIN) 100 MCG tablet See admin instructions.   vitamin B-12 (CYANOCOBALAMIN) 1000 MCG tablet Take 1 tablet (1,000 mcg total) by mouth daily.   No current facility-administered medications for this visit. (Other)      REVIEW OF SYSTEMS:    ALLERGIES Allergies  Allergen Reactions   Benazepril Other (See Comments)    Unknown reaction at age 84-65 - possibly dizziness   Ozempic (0.25 Or 0.5 Mg-Dose) [Semaglutide(0.25 Or 0.37m-Dos)] Diarrhea    Gi intollerance   Tape     TOLERATES PAPER TAPE ONLY- due to thin skin. NOT ALLERGY PER PT   Angiotensin Receptor Blockers Other (See Comments)    Hypotension reaction   Fe-Succ-C-Thre-B12-Des Stomach Other (See Comments)    Unknown reaction   Sulfamethoxazole-Trimethoprim Other (See Comments)    Unknown reaction    PAST MEDICAL HISTORY Past Medical History:  Diagnosis Date   ACC/AHA stage C congestive heart failure due to ischemic cardiomyopathy ([facility] 01/31/2009   Qualifier: Diagnosis of  By: BOlevia Perches MD, FGlenetta Hew   ANXIETY 01/31/2009   Qualifier: Diagnosis of  By: TLovette Cliche CNA, Christy     Arthritis    "qwhere" (03/30/2016)   Benign essential HTN    CAD (coronary artery disease) 05/2008   a. s/p CABG in 2009.   Cerebrovascular accident (CVA) due to embolism of right middle cerebral artery (HCC)    Chronic anticoagulation    Chronic kidney disease (CKD), stage III (moderate) (HCC)    Chronic lower back pain    Chronic pain syndrome    Chronic systolic CHF (congestive heart failure) ([facility]    DM type 2 with diabetic peripheral neuropathy (HCC)    Dysrhythmia    A Fib   Facial weakness, post-stroke    Gastrointestinal hemorrhage 03/26/2017   GERD (gastroesophageal reflux disease)    Hemiparesis (HCC)    History of CVA with residual deficit 03/26/2017   Hyperlipidemia    Hypertension    Ischemic cardiomyopathy     Longstanding persistent atrial fibrillation ([facility]    a. Postoperative in 2009;  S/P transesophageal echocardiography-guided cardioversion, previously on Amiodarone and Coumadin. b. recurrent in April 2013 -> pt elected rate control strategy, initially Coumadin -> had stroke with subtherapeutic INR in 03/2016 and transitioned to Eliquis.   Macular degeneration    Morbid obesity ([facility] 03/26/2017   OSA on CPAP    severe OSA with AHI 34/hr   Persistent atrial fibrillation (HCC)    Thrombocytopenia (HCC)    Past Surgical History:  Procedure Laterality Date   APPENDECTOMY  [place] RELEASE Left 03/28/2021   Procedure: LEFT CARPAL TUNNEL RELEASE;  Surgeon: Daryll Brod, MD;  Location: [facility];  Service: Orthopedics;  Laterality: Left;  60 MIN   CATARACT EXTRACTION Right 2012   COLONOSCOPY WITH PROPOFOL Left 03/27/2017   Procedure: COLONOSCOPY WITH PROPOFOL;  Surgeon: Ronnette Juniper, MD;  Location: [facility];  Service: Gastroenterology;  Laterality: Left;   CORONARY ANGIOPLASTY WITH STENT PLACEMENT  2009   "put 2 stents in"   EXCISION METACARPAL MASS Left 03/28/2021   Procedure: EXCISION MASS LEFT THUMB;  Surgeon: Daryll Brod, MD;  Location: [facility];  Service: Orthopedics;  Laterality: Left;   EYE SURGERY     bilateral cataract   HERNIA REPAIR     IR LUMBAR DISC ASPIRATION W/IMG GUIDE  10/08/2018   JOINT REPLACEMENT     TEE WITH CARDIOVERSION     Postoperative in 2009;  S/P transesophageal echocardiography-guided cardioversion,   TOTAL KNEE ARTHROPLASTY Right 1994   VENTRAL HERNIA REPAIR  10/1999   With mesh    FAMILY HISTORY Family History  Problem Relation Age of Onset   Clotting disorder Mother        Cerebral hemorrhage   Other Mother        cerebral hemorrhage   Emphysema Father        COD   Lung cancer Brother    Heart disease Neg Hx     SOCIAL HISTORY Social History   Tobacco Use   Smoking status: Never   Smokeless tobacco: Never  Vaping Use    Vaping Use: Never used  Substance Use Topics   Alcohol use: No   Drug use: Never         OPHTHALMIC EXAM:  Base Eye Exam     Visual Acuity (ETDRS)       Right Left   Dist [place] 20/60 -1 20/50 -2   Dist ph [place] NI 20/40    Correction: Glasses         Tonometry (Tonopen, 3:45 PM)       Right Left   Pressure 12 13         Pupils       Pupils Dark Light Shape React APD   Right PERRL 3 2 Round Slow None   Left PERRL 3 2 Round Slow None         Visual Fields (Counting fingers)       Left Right    Full Full         Extraocular Movement       Right Left    Full Full         Neuro/Psych     Oriented x3: Yes   Mood/Affect: Normal         Dilation     Left eye: 1.0% Mydriacyl, 2.5% Phenylephrine @ 3:45 PM           Slit Lamp and Fundus Exam     External Exam       Right Left   External Normal Normal         Slit Lamp Exam       Right Left   Lids/Lashes Normal Normal   Conjunctiva/Sclera White and quiet White and quiet   Cornea Clear Clear   Anterior Chamber Deep and quiet Deep and quiet   Iris Round and reactive Round and reactive   Lens Posterior chamber intraocular lens Posterior chamber intraocular lens  Anterior Vitreous Normal Normal         Fundus Exam       Right Left   Posterior Vitreous Posterior vitreous detachment small vitreous hemorrhage superiorly, Vitreous hemorrhage Partial posterior vitreous detachment   Disc Normal Normal   C/D Ratio 0.7 0.7   Macula Soft drusen, Retinal pigment epithelial mottling, no macular thickening, geographic atrophy superior portion of the macula Intermediate age related macular degeneration, Hard drusen, Pigmented atrophy   Vessels NPDR-Severe, with arm inferiorly NPDR-Severe   Periphery Normal Normal            IMAGING AND PROCEDURES  Imaging and Procedures for 05/22/21  Panretinal Photocoagulation - OS - Left Eye       Time Out Confirmed correct patient,  procedure, site, and patient consented.   Anesthesia Topical anesthesia was used. Anesthetic medications included Proparacaine 0.5%.   Laser Information The type of laser was diode. Color was yellow. The duration in seconds was 0.03. The spot size was 390 microns. Laser power was 230. Total spots was 592.   Post-op The patient tolerated the procedure well. There were no complications. The patient received written and verbal post procedure care education.   Notes Superior anterior retina treated             ASSESSMENT/PLAN:  Severe nonproliferative diabetic retinopathy of left eye (HCC) Severe NPDR, with peripheral nonperfusion PRP #1 today anterior retina in order to slow and prevent progression of PDR      ICD-10-CM   1. Severe nonproliferative diabetic retinopathy of left eye without macular edema associated with type 2 diabetes mellitus (HCC)  X32.4401 Panretinal Photocoagulation - OS - Left Eye      1.  2.  3.  Ophthalmic Meds Ordered this visit:  No orders of the defined types were placed in this encounter.      Return in about 3 weeks (around 06/12/2021) for dilate, OS, PRP.  There are no Patient Instructions on file for this visit.   Explained the diagnoses, plan, and follow up with the patient and they expressed understanding.  Patient expressed understanding of the importance of proper follow up care.   Clent Demark Nazareth Norenberg M.D. Diseases & Surgery of the Retina and Vitreous Retina & Diabetic [place] 05/22/21     Abbreviations: M myopia (nearsighted); A astigmatism; H hyperopia (farsighted); P presbyopia; Mrx spectacle prescription;  CTL contact lenses; OD right eye; OS left eye; OU both eyes  XT exotropia; ET esotropia; PEK punctate epithelial keratitis; PEE punctate epithelial erosions; DES dry eye syndrome; MGD meibomian gland dysfunction; ATs artificial tears; PFAT's preservative free artificial tears; [place] nuclear sclerotic cataract; PSC posterior  subcapsular cataract; ERM epi-retinal membrane; PVD posterior vitreous detachment; RD retinal detachment; DM diabetes mellitus; DR diabetic retinopathy; NPDR non-proliferative diabetic retinopathy; PDR proliferative diabetic retinopathy; CSME clinically significant macular edema; DME diabetic macular edema; dbh dot blot hemorrhages; CWS cotton wool spot; POAG primary open angle glaucoma; C/D cup-to-disc ratio; HVF humphrey visual field; GVF goldmann visual field; OCT optical coherence tomography; IOP intraocular pressure; BRVO Branch retinal vein occlusion; CRVO central retinal vein occlusion; CRAO central retinal artery occlusion; BRAO branch retinal artery occlusion; RT retinal tear; SB scleral buckle; PPV pars plana vitrectomy; VH Vitreous hemorrhage; PRP panretinal laser photocoagulation; IVK intravitreal kenalog; VMT vitreomacular traction; MH Macular hole;  NVD neovascularization of the disc; NVE neovascularization elsewhere; AREDS age related eye disease study; ARMD age related macular degeneration; POAG primary open angle glaucoma; EBMD epithelial/anterior basement membrane dystrophy; ACIOL  anterior chamber intraocular lens; IOL intraocular lens; PCIOL posterior chamber intraocular lens; Phaco/IOL phacoemulsification with intraocular lens placement; [place] photorefractive keratectomy; LASIK laser assisted in situ keratomileusis; HTN hypertension; DM diabetes mellitus; COPD chronic obstructive pulmonary disease

## 2021-06-06 ENCOUNTER — Encounter (INDEPENDENT_AMBULATORY_CARE_PROVIDER_SITE_OTHER): Payer: Medicare HMO | Admitting: Ophthalmology

## 2021-06-14 ENCOUNTER — Encounter (INDEPENDENT_AMBULATORY_CARE_PROVIDER_SITE_OTHER): Payer: Medicare HMO | Admitting: Ophthalmology

## 2021-06-15 ENCOUNTER — Other Ambulatory Visit: Payer: Self-pay

## 2021-06-15 ENCOUNTER — Ambulatory Visit (INDEPENDENT_AMBULATORY_CARE_PROVIDER_SITE_OTHER): Payer: Medicare HMO | Admitting: Ophthalmology

## 2021-06-15 ENCOUNTER — Encounter (INDEPENDENT_AMBULATORY_CARE_PROVIDER_SITE_OTHER): Payer: Self-pay | Admitting: Ophthalmology

## 2021-06-15 DIAGNOSIS — E113492 Type 2 diabetes mellitus with severe nonproliferative diabetic retinopathy without macular edema, left eye: Secondary | ICD-10-CM

## 2021-06-15 NOTE — Assessment & Plan Note (Signed)
We will commence with PRP #1 anterior peripheral retina so as to prevent and delay progression to PDR S patient's ambulatory status is over time to

## 2021-06-15 NOTE — Progress Notes (Signed)
06/15/2021     CHIEF COMPLAINT Patient presents for Retina Follow Up (3 week PRP OS/Pt states VA OU stable since last visit. Pt denies FOL, floaters, or ocular pain OU. /LBS: 156 this morning/A1C: 7.3, 03/24/21//)   HISTORY OF PRESENT ILLNESS: [name] is a 80 y.o. female who presents to the clinic today for:   HPI     Retina Follow Up           Diagnosis: Diabetic Retinopathy   Laterality: left eye   Onset: 3 weeks ago   Severity: mild   Duration: 3 weeks   Course: stable   Comments: 3 week PRP OS Pt states VA OU stable since last visit. Pt denies FOL, floaters, or ocular pain OU.  LBS: 156 this morning A1C: 7.3, 03/24/21         Last edited by Nelva Nay, COA on 06/15/2021  3:54 PM.      Referring physician: Maury Dus, MD [address] [place],  [place] [address]  HISTORICAL INFORMATION:   Selected notes from the MEDICAL RECORD NUMBER    Lab Results  Component Value Date   HGBA1C 7.3 (H) 03/24/2021     CURRENT MEDICATIONS: Current Outpatient Medications (Ophthalmic Drugs)  Medication Sig   Neomycin-Bacitracin Zn-Polymyx 3.5-400-10000 OINT 1 application   No current facility-administered medications for this visit. (Ophthalmic Drugs)   Current Outpatient Medications (Other)  Medication Sig   acetaminophen (TYLENOL 8 HOUR) 650 MG CR tablet Take 1 tablet (650 mg total) by mouth every 8 (eight) hours as needed for pain.   apixaban (ELIQUIS) 5 MG TABS tablet Take 1 tablet (5 mg total) by mouth 2 (two) times daily.   atorvastatin (LIPITOR) 40 MG tablet TAKE 1 TABLET BY MOUTH  DAILY AT 6 PM. (Patient taking differently: Take 40 mg by mouth daily at 6 PM.)   Blood Glucose Monitoring Suppl (ONE TOUCH ULTRA 2) w/Device KIT USE TO CHECK BLOOD SUGAR UPTO 3 TIMES A DAY   carvedilol (COREG) 6.25 MG tablet TAKE 1 TABLET BY MOUTH  TWICE DAILY WITH A MEAL (Patient taking differently: Take 6.25 mg by mouth 2 (two) times daily with a meal.)   cholecalciferol  (VITAMIN D3) 25 MCG (1000 UNIT) tablet Take 1,000 Units by mouth daily.   docusate sodium (COLACE) 100 MG capsule 1 capsule as needed   DULoxetine (CYMBALTA) 20 MG capsule Take 20 mg by mouth daily.   empagliflozin (JARDIANCE) 10 MG TABS tablet Take 1 tablet (10 mg total) by mouth daily before breakfast.   ferrous sulfate 325 (65 FE) MG tablet TAKE 1 TABLET BY MOUTH EVERY DAY WITH BREAKFAST (Patient taking differently: Take 325 mg by mouth daily with breakfast.)   gabapentin (NEURONTIN) 400 MG capsule TK 1 C PO BID   glucose blood (ONE TOUCH ULTRA TEST) test strip Use three times a day   insulin glargine (LANTUS) 100 UNIT/ML injection See admin instructions.   Lancets (ONETOUCH DELICA PLUS LANCET33G) MISC 1 application by Does not apply route daily. Please use to check blood sugar up to three times daily.   losartan (COZAAR) 25 MG tablet 1/2 tablet   metolazone (ZAROXOLYN) 2.5 MG tablet Take only as directed by CHF clinic   nitroGLYCERIN (NITROSTAT) 0.4 MG SL tablet PLACE 1 TABLET (0.4 MG TOTAL) UNDER THE TONGUE EVERY 5 (FIVE) MINUTES AS NEEDED FOR CHEST PAIN (UP TO 3 DOSES).   potassium chloride (KLOR-CON) 20 MEQ packet 2 tablets   potassium chloride SA (KLOR-CON)  20 MEQ tablet Take 3 tablets (60 mEq total) by mouth daily.   rOPINIRole (REQUIP) 0.5 MG tablet Take 1 mg by mouth at bedtime.   silver sulfADIAZINE (SILVADENE) 1 % cream 1 application to affected area   spironolactone (ALDACTONE) 25 MG tablet Take 0.5 tablets (12.5 mg total) by mouth daily.   spironolactone (ALDACTONE) 25 MG tablet Take 0.5 tablets (12.5 mg total) by mouth daily. (Patient not taking: No sig reported)   torsemide (DEMADEX) 20 MG tablet Take 3 tablets (60 mg total) by mouth 2 (two) times daily. (Patient taking differently: Take 40-60 mg by mouth See admin instructions. Take 60 mg by mouth in the morning and 40 mg in the afternoon)   torsemide (DEMADEX) 20 MG tablet Take 3 tablets (60 mg total) by mouth 2 (two) times  daily. (Patient not taking: No sig reported)   traMADol (ULTRAM) 50 MG tablet Take 1 tablet (50 mg total) by mouth every 6 (six) hours as needed.   vitamin B-12 (CYANOCOBALAMIN) 100 MCG tablet See admin instructions.   vitamin B-12 (CYANOCOBALAMIN) 1000 MCG tablet Take 1 tablet (1,000 mcg total) by mouth daily.   No current facility-administered medications for this visit. (Other)      REVIEW OF SYSTEMS:    ALLERGIES Allergies  Allergen Reactions   Benazepril Other (See Comments)    Unknown reaction at age 10-65 - possibly dizziness   Ozempic (0.25 Or 0.5 Mg-Dose) [Semaglutide(0.25 Or 0.5mg -Dos)] Diarrhea    Gi intollerance   Tape     TOLERATES PAPER TAPE ONLY- due to thin skin. NOT ALLERGY PER PT   Angiotensin Receptor Blockers Other (See Comments)    Hypotension reaction   Fe-Succ-C-Thre-B12-Des Stomach Other (See Comments)    Unknown reaction   Sulfamethoxazole-Trimethoprim Other (See Comments)    Unknown reaction    PAST MEDICAL HISTORY Past Medical History:  Diagnosis Date   ACC/AHA stage C congestive heart failure due to ischemic cardiomyopathy ([place]) 01/31/2009   Qualifier: Diagnosis of  By: Olevia Perches, MD, Glenetta Hew    ANXIETY 01/31/2009   Qualifier: Diagnosis of  By: Lovette Cliche, CNA, Christy     Arthritis    "qwhere" (03/30/2016)   Benign essential HTN    CAD (coronary artery disease) 05/2008   a. s/p CABG in 2009.   Cerebrovascular accident (CVA) due to embolism of right middle cerebral artery (HCC)    Chronic anticoagulation    Chronic kidney disease (CKD), stage III (moderate) (HCC)    Chronic lower back pain    Chronic pain syndrome    Chronic systolic CHF (congestive heart failure) ([place])    DM type 2 with diabetic peripheral neuropathy (HCC)    Dysrhythmia    A Fib   Facial weakness, post-stroke    Gastrointestinal hemorrhage 03/26/2017   GERD (gastroesophageal reflux disease)    Hemiparesis (HCC)    History of CVA with residual deficit 03/26/2017    Hyperlipidemia    Hypertension    Ischemic cardiomyopathy    Longstanding persistent atrial fibrillation ([place])    a. Postoperative in 2009;  S/P transesophageal echocardiography-guided cardioversion, previously on Amiodarone and Coumadin. b. recurrent in April 2013 -> pt elected rate control strategy, initially Coumadin -> had stroke with subtherapeutic INR in 03/2016 and transitioned to Eliquis.   Macular degeneration    Morbid obesity ([facility]) 03/26/2017   OSA on CPAP    severe OSA with AHI 34/hr   Persistent atrial fibrillation (HCC)    Thrombocytopenia (HCC)    Past  Surgical History:  Procedure Laterality Date   APPENDECTOMY  1946   BACK SURGERY     CARPAL TUNNEL RELEASE Left 03/28/2021   Procedure: LEFT CARPAL TUNNEL RELEASE;  Surgeon: Daryll Brod, MD;  Location: [facility];  Service: Orthopedics;  Laterality: Left;  60 MIN   CATARACT EXTRACTION Right 2012   COLONOSCOPY WITH PROPOFOL Left 03/27/2017   Procedure: COLONOSCOPY WITH PROPOFOL;  Surgeon: Ronnette Juniper, MD;  Location: [facility];  Service: Gastroenterology;  Laterality: Left;   CORONARY ANGIOPLASTY WITH STENT PLACEMENT  2009   "put 2 stents in"   EXCISION METACARPAL MASS Left 03/28/2021   Procedure: EXCISION MASS LEFT THUMB;  Surgeon: Daryll Brod, MD;  Location: [facility];  Service: Orthopedics;  Laterality: Left;   EYE SURGERY     bilateral cataract   HERNIA REPAIR     IR LUMBAR DISC ASPIRATION W/IMG GUIDE  10/08/2018   JOINT REPLACEMENT     TEE WITH CARDIOVERSION     Postoperative in 2009;  S/P transesophageal echocardiography-guided cardioversion,   TOTAL KNEE ARTHROPLASTY Right 1994   VENTRAL HERNIA REPAIR  10/1999   With mesh    FAMILY HISTORY Family History  Problem Relation Age of Onset   Clotting disorder Mother        Cerebral hemorrhage   Other Mother        cerebral hemorrhage   Emphysema Father        COD   Lung cancer Brother    Heart disease Neg Hx     SOCIAL HISTORY Social History   Tobacco Use    Smoking status: Never   Smokeless tobacco: Never  Vaping Use   Vaping Use: Never used  Substance Use Topics   Alcohol use: No   Drug use: Never         OPHTHALMIC EXAM:  Base Eye Exam     Visual Acuity (ETDRS)       Right Left   Dist Orin 20/50 -2 20/30 -2   Dist ph  NI          Tonometry (Tonopen, 3:59 PM)       Right Left   Pressure 15 14         Pupils       Pupils Dark Light Shape React APD   Right PERRL 3 2 Round Slow None   Left PERRL 3 2 Round Slow None         Visual Fields (Counting fingers)       Left Right    Full Full         Extraocular Movement       Right Left    Full Full         Neuro/Psych     Oriented x3: Yes   Mood/Affect: Normal         Dilation     Left eye: 1.0% Mydriacyl, 2.5% Phenylephrine @ 3:59 PM           Slit Lamp and Fundus Exam     External Exam       Right Left   External Normal Normal         Slit Lamp Exam       Right Left   Lids/Lashes Normal Normal   Conjunctiva/Sclera White and quiet White and quiet   Cornea Clear Clear   Anterior Chamber Deep and quiet Deep and quiet   Iris Round and reactive Round and reactive   Lens Posterior chamber intraocular lens  Posterior chamber intraocular lens   Anterior Vitreous Normal Normal         Fundus Exam       Right Left   Posterior Vitreous Posterior vitreous detachment small vitreous hemorrhage superiorly, Vitreous hemorrhage Partial posterior vitreous detachment   Disc Normal Normal   C/D Ratio 0.7 0.7   Macula Soft drusen, Retinal pigment epithelial mottling, no macular thickening, geographic atrophy superior portion of the macula Intermediate age related macular degeneration, Hard drusen, Pigmented atrophy   Vessels NPDR-Severe, with arm inferiorly NPDR-Severe   Periphery Normal Normal            IMAGING AND PROCEDURES  Imaging and Procedures for 06/15/21  Panretinal Photocoagulation - OS - Left Eye       Time  Out Confirmed correct patient, procedure, site, and patient consented.   Anesthesia Topical anesthesia was used. Anesthetic medications included Proparacaine 0.5%.   Laser Information The type of laser was diode. Color was yellow. The duration in seconds was 0.03. The spot size was 390 microns. Laser power was 280. Total spots was 387.   Post-op The patient tolerated the procedure well. There were no complications. The patient received written and verbal post procedure care education.   Notes Patient has some difficulty with positioning after laser NAVILAS  PRP temporal to the macula as well as inferiorly, peripheral retina             ASSESSMENT/PLAN:  Severe nonproliferative diabetic retinopathy of left eye ([place]) We will commence with PRP #1 anterior peripheral retina so as to prevent and delay progression to PDR S patient's ambulatory status is over time to     ICD-10-CM   1. Severe nonproliferative diabetic retinopathy of left eye without macular edema associated with type 2 diabetes mellitus (HCC)  T73.2202 Panretinal Photocoagulation - OS - Left Eye      1.  OS, PRP number to be delivered today.  Some difficulty with patient's ability to position after laser, nonetheless adequate laser delivered inferior retinal as well as temporal to the macula.  No complications.  2.  3.  Ophthalmic Meds Ordered this visit:  No orders of the defined types were placed in this encounter.      No follow-ups on file.  There are no Patient Instructions on file for this visit.   Explained the diagnoses, plan, and follow up with the patient and they expressed understanding.  Patient expressed understanding of the importance of proper follow up care.   Clent Demark Meshilem Machuca M.D. Diseases & Surgery of the Retina and Vitreous Retina & Diabetic [place] 06/15/21     Abbreviations: M myopia (nearsighted); A astigmatism; H hyperopia (farsighted); P presbyopia; Mrx spectacle  prescription;  CTL contact lenses; OD right eye; OS left eye; OU both eyes  XT exotropia; ET esotropia; PEK punctate epithelial keratitis; PEE punctate epithelial erosions; DES dry eye syndrome; MGD meibomian gland dysfunction; ATs artificial tears; PFAT's preservative free artificial tears; [place] nuclear sclerotic cataract; PSC posterior subcapsular cataract; ERM epi-retinal membrane; PVD posterior vitreous detachment; RD retinal detachment; DM diabetes mellitus; DR diabetic retinopathy; NPDR non-proliferative diabetic retinopathy; PDR proliferative diabetic retinopathy; CSME clinically significant macular edema; DME diabetic macular edema; dbh dot blot hemorrhages; CWS cotton wool spot; POAG primary open angle glaucoma; C/D cup-to-disc ratio; HVF humphrey visual field; GVF goldmann visual field; OCT optical coherence tomography; IOP intraocular pressure; BRVO Branch retinal vein occlusion; CRVO central retinal vein occlusion; CRAO central retinal artery occlusion; BRAO branch retinal artery occlusion; RT  retinal tear; SB scleral buckle; PPV pars plana vitrectomy; VH Vitreous hemorrhage; PRP panretinal laser photocoagulation; IVK intravitreal kenalog; VMT vitreomacular traction; MH Macular hole;  NVD neovascularization of the disc; NVE neovascularization elsewhere; AREDS age related eye disease study; ARMD age related macular degeneration; POAG primary open angle glaucoma; EBMD epithelial/anterior basement membrane dystrophy; ACIOL anterior chamber intraocular lens; IOL intraocular lens; PCIOL posterior chamber intraocular lens; Phaco/IOL phacoemulsification with intraocular lens placement; PRK photorefractive keratectomy; LASIK laser assisted in situ keratomileusis; HTN hypertension; DM diabetes mellitus; COPD chronic obstructive pulmonary disease  

## 2021-06-15 NOTE — Assessment & Plan Note (Signed)
>>  ASSESSMENT AND PLAN FOR SEVERE NONPROLIFERATIVE DIABETIC RETINOPATHY OF LEFT EYE (HCC) WRITTEN ON 06/15/2021  4:52 PM BY Fawn Kirk A, MD  We will commence with PRP #1 anterior peripheral retina so as to prevent and delay progression to PDR S patient's ambulatory status is over time to

## 2021-06-22 ENCOUNTER — Other Ambulatory Visit: Payer: Self-pay

## 2021-06-22 ENCOUNTER — Ambulatory Visit (INDEPENDENT_AMBULATORY_CARE_PROVIDER_SITE_OTHER): Payer: Medicare HMO | Admitting: Family Medicine

## 2021-06-22 VITALS — HR 70 | Ht 69.0 in | Wt 207.1 lb

## 2021-06-22 DIAGNOSIS — N3000 Acute cystitis without hematuria: Secondary | ICD-10-CM

## 2021-06-22 DIAGNOSIS — R350 Frequency of micturition: Secondary | ICD-10-CM

## 2021-06-22 LAB — POCT UA - MICROSCOPIC ONLY: WBC, Ur, HPF, POC: >20 (ref 0–5)

## 2021-06-22 LAB — POCT URINALYSIS DIP (MANUAL ENTRY)
Bilirubin, UA: NEGATIVE
Glucose, UA: 500 mg/dL — AB
Ketones, POC UA: NEGATIVE mg/dL
Nitrite, UA: NEGATIVE
Protein Ur, POC: NEGATIVE mg/dL
Spec Grav, UA: 1.015 (ref 1.010–1.025)
Urobilinogen, UA: 0.2 E.U./dL
pH, UA: 7 (ref 5.0–8.0)

## 2021-06-22 MED ORDER — CEPHALEXIN 500 MG PO CAPS: 500.0000 mg | ORAL_CAPSULE | Freq: Four times a day (QID) | ORAL | 0 refills | Status: AC

## 2021-06-22 NOTE — Patient Instructions (Signed)
It was great to meet you today, [name]!  Here's what we talked about:  You came in with symptoms of a urinary tract infection. Your urinalysis showed you have a UTI. We have sent in an antibiotic, Keflex, to treat your UTI. Please let us know if your symptoms do not improve after finishing the entire antibiotic course.  We have scheduled you an appointment for an update on your diabetes, and it is listed below.  Take care and seek immediate care sooner if you develop any concerns.  [name], [place]

## 2021-06-22 NOTE — Progress Notes (Addendum)
    SUBJECTIVE:   CHIEF COMPLAINT / HPI:   [name] (MRN: [id number]) is an 80 y.o. female with a history of UTI, CKD, and T2DM who presents with urinary pressure and frequency.  Urinary pressure and frequency Patient states her symptoms began one week ago. She states she feels pressure and has been urinating more than usual. She takes a fluid pill and typically urinates every 2 hours, but over the last week, she has urinated every 45 minutes. She also endorses concurrent redness and irritation of her external genitalia, and she has been using Desitin with relief. She has not had any suprapubic pain or dysuria. She denies any sexual activity. She has had a UTI in the past with these same symptoms, and she feels this is another UTI.   PERTINENT  PMH / PSH: History of E. Coli UTI, most recently in Dec 2021 that was treated with Keflex.  OBJECTIVE:   Vitals:   06/22/21 1419  Pulse: 70  SpO2: 99%    PHYSICAL EXAM  GEN: Well-appearing, pleasant, in NAD HEAD: NCAT CVS: RRR, normal S1/S2, no murmurs, rubs, gallops RESP: Breathing comfortably on RA, no retractions, wheezes, rhonchi, or crackles ABD: Soft, non-tender, no organomegaly or masses GU: Deferred due to patient preference SKIN: Solar purpura present along dorsal forearms bilaterally, no other obvious lesions MSK: Mobilizes with walker, ROM grossly normal  ASSESSMENT/PLAN:   UTI Patient with urinary pressure and frequency x1 week. She also endorsed erythema and irritation of the external genitalia but declined GU exam, stating she has this symptom with each UTI. UA showed moderate leukocytes, 500 glucose, trace RBCs and >20 WBCs concerning for UTI.  -Keflex 500 mg BID for 5 days (Rx initially sent for QID, but after review of patient's kidney function, it was changed to BID. Both pharmacy and patient were notified) -Counseled on return precautions if symptoms do not improve after course of antibiotics  Mikal Plane, [place]  I was personally present and re-performed the exam and medical decision making and verified the service and findings are accurately documented in the student's note.  Alcus Dad, MD 06/24/2021 10:46 AM

## 2021-06-26 ENCOUNTER — Other Ambulatory Visit: Payer: Self-pay | Admitting: Orthopedic Surgery

## 2021-06-26 DIAGNOSIS — G5603 Carpal tunnel syndrome, bilateral upper limbs: Secondary | ICD-10-CM | POA: Diagnosis not present

## 2021-07-04 ENCOUNTER — Telehealth (HOSPITAL_COMMUNITY): Payer: Self-pay | Admitting: *Deleted

## 2021-07-04 NOTE — Telephone Encounter (Signed)
Received surg clearance request from [facility]. Pt needs clearance for R carpal tunnel release with IV reginal forearm block, and ok to hold Eliquis  Per Dr Aundra Dubin: "pt is cleared for surgery, can hold Eliquis 2 days prior, restart after"  Note faxed back to them at [phone] atten: Hassan Rowan

## 2021-07-06 ENCOUNTER — Encounter (HOSPITAL_COMMUNITY): Payer: Medicare HMO | Admitting: Cardiology

## 2021-07-14 ENCOUNTER — Ambulatory Visit: Payer: Medicare HMO | Admitting: Family Medicine

## 2021-07-14 ENCOUNTER — Other Ambulatory Visit: Payer: Self-pay

## 2021-07-14 ENCOUNTER — Encounter: Payer: Self-pay | Admitting: Family Medicine

## 2021-07-14 ENCOUNTER — Ambulatory Visit (INDEPENDENT_AMBULATORY_CARE_PROVIDER_SITE_OTHER): Payer: Medicare HMO | Admitting: Family Medicine

## 2021-07-14 VITALS — BP 130/65 | HR 71 | Wt 198.2 lb

## 2021-07-14 DIAGNOSIS — E1142 Type 2 diabetes mellitus with diabetic polyneuropathy: Secondary | ICD-10-CM | POA: Diagnosis not present

## 2021-07-14 DIAGNOSIS — Z0000 Encounter for general adult medical examination without abnormal findings: Secondary | ICD-10-CM

## 2021-07-14 LAB — POCT GLYCOSYLATED HEMOGLOBIN (HGB A1C): HbA1c, POC (controlled diabetic range): 7.7 % — AB (ref 0.0–7.0)

## 2021-07-14 MED ORDER — ZOSTER VAC RECOMB ADJUVANTED 50 MCG/0.5ML IM SUSR: 0.5000 mL | Freq: Once | INTRAMUSCULAR | 0 refills | Status: AC

## 2021-07-14 NOTE — Progress Notes (Signed)
    SUBJECTIVE:   CHIEF COMPLAINT / HPI:   Type 2 Diabetes Current meds: Jardiance 10mg  daily Checks fasting sugars daily- typically between 150-160 Excellent dietary compliance- breakfast is grits, oatmeal or cereal. Lunch is a salad. Meals on Wheels for dinner (one meat, two veggies, fruit cup). Diet soda 1 per day. No dessert or sweets, no sweetened beverages UTD on eye exam Needs foot exam   PERTINENT  PMH / PSH: CKD Stage 4, HFrEF, CAD, HTN  OBJECTIVE:   BP 130/65   Pulse 71   Wt 198 lb 3.2 oz (89.9 kg)   LMP  (LMP Unknown)   SpO2 99%   BMI 29.27 kg/m   General: NAD, pleasant, able to participate in exam Respiratory: No respiratory distress Skin: warm and dry, no rashes noted Psych: Normal affect and mood Neuro: grossly intact Diabetic Foot Exam - Simple   Simple Foot Form Diabetic Foot exam was performed with the following findings: Yes 07/14/2021  3:59 PM  Visual Inspection No deformities, no ulcerations, no other skin breakdown bilaterally: Yes Sensation Testing Pulse Check Comments Sensation intact to light touch bilaterally. Decreased sensation to monofilament testing in 1-2 locations bilaterally      ASSESSMENT/PLAN:   DM type 2 with diabetic peripheral neuropathy (HCC) A1c 7.7% today which is at goal. Given her age and comorbidities, her A1c goal is <8.0%. Foot exam largely unremarkable today- has known neuropathy and slightly decreased sensation to monofilament testing. She is UTD on eye exam and has had BMP within the last 6 months.  Current meds include Jardiance 10mg  daily, which is prescribed by her cardiologist for HFrEF. She has upcoming appt with Dr. Aundra Dubin- will defer to him for ongoing management of the Jardiance, but would caution as her GFR has been persistently <30.   Health Maintenance Given printed Rx for shingrix to receive at her pharmacy as she has never had shingles vaccine.  Alcus Dad, MD [place]

## 2021-07-14 NOTE — Patient Instructions (Addendum)
It was great to see you!  -Your A1c was 7.7% today, which means your diabetes is fairly well controlled. Your goal A1c is less than 8.0% -We will not make any medication changes today -I'm glad you're seeing your cardiologist soon. I imagine they may check your kidney function, so I will be on the lookout for these results. -I have printed a prescription for your shingles vaccine. You can have this done at any pharmacy. Many people experience fatigue, body aches, and low grade fever for 24-48 hours after this vaccine, but you can take Tylenol as needed.  Take care and seek immediate care sooner if you develop any concerns.  Dr. [facility]

## 2021-07-15 NOTE — Assessment & Plan Note (Addendum)
A1c 7.7% today which is at goal. Given her age and comorbidities, her A1c goal is <8.0%. Foot exam largely unremarkable today- has known neuropathy and slightly decreased sensation to monofilament testing. She is UTD on eye exam and has had BMP within the last 6 months.  Current meds include Jardiance 10mg  daily, which is prescribed by her cardiologist for HFrEF. She has upcoming appt with Dr. Aundra Dubin- will defer to him for ongoing management of the Jardiance, but would caution as her GFR has been persistently <30.

## 2021-07-19 ENCOUNTER — Other Ambulatory Visit: Payer: Self-pay

## 2021-07-19 ENCOUNTER — Encounter (HOSPITAL_COMMUNITY): Payer: Self-pay | Admitting: Cardiology

## 2021-07-19 ENCOUNTER — Ambulatory Visit (HOSPITAL_COMMUNITY)
Admission: RE | Admit: 2021-07-19 | Discharge: 2021-07-19 | Disposition: A | Discharge status: Home / Self Care | Payer: Medicare HMO | Source: Ambulatory Visit | Attending: Cardiology | Admitting: Cardiology

## 2021-07-19 VITALS — BP 100/60 | HR 72 | Wt 198.4 lb

## 2021-07-19 DIAGNOSIS — N183 Chronic kidney disease, stage 3 unspecified: Secondary | ICD-10-CM | POA: Diagnosis not present

## 2021-07-19 DIAGNOSIS — G4733 Obstructive sleep apnea (adult) (pediatric): Secondary | ICD-10-CM | POA: Insufficient documentation

## 2021-07-19 DIAGNOSIS — E1122 Type 2 diabetes mellitus with diabetic chronic kidney disease: Secondary | ICD-10-CM | POA: Insufficient documentation

## 2021-07-19 DIAGNOSIS — M25511 Pain in right shoulder: Secondary | ICD-10-CM | POA: Diagnosis not present

## 2021-07-19 DIAGNOSIS — Z951 Presence of aortocoronary bypass graft: Secondary | ICD-10-CM | POA: Diagnosis not present

## 2021-07-19 DIAGNOSIS — R0602 Shortness of breath: Secondary | ICD-10-CM | POA: Insufficient documentation

## 2021-07-19 DIAGNOSIS — Z8249 Family history of ischemic heart disease and other diseases of the circulatory system: Secondary | ICD-10-CM | POA: Insufficient documentation

## 2021-07-19 DIAGNOSIS — I482 Chronic atrial fibrillation, unspecified: Secondary | ICD-10-CM | POA: Diagnosis not present

## 2021-07-19 DIAGNOSIS — Z888 Allergy status to other drugs, medicaments and biological substances status: Secondary | ICD-10-CM | POA: Insufficient documentation

## 2021-07-19 DIAGNOSIS — Z79899 Other long term (current) drug therapy: Secondary | ICD-10-CM | POA: Insufficient documentation

## 2021-07-19 DIAGNOSIS — I130 Hypertensive heart and chronic kidney disease with heart failure and stage 1 through stage 4 chronic kidney disease, or unspecified chronic kidney disease: Secondary | ICD-10-CM | POA: Insufficient documentation

## 2021-07-19 DIAGNOSIS — I5022 Chronic systolic (congestive) heart failure: Secondary | ICD-10-CM | POA: Insufficient documentation

## 2021-07-19 DIAGNOSIS — I255 Ischemic cardiomyopathy: Secondary | ICD-10-CM | POA: Diagnosis not present

## 2021-07-19 DIAGNOSIS — Z7901 Long term (current) use of anticoagulants: Secondary | ICD-10-CM | POA: Insufficient documentation

## 2021-07-19 DIAGNOSIS — Z881 Allergy status to other antibiotic agents status: Secondary | ICD-10-CM | POA: Insufficient documentation

## 2021-07-19 DIAGNOSIS — Z7984 Long term (current) use of oral hypoglycemic drugs: Secondary | ICD-10-CM | POA: Insufficient documentation

## 2021-07-19 DIAGNOSIS — Z8673 Personal history of transient ischemic attack (TIA), and cerebral infarction without residual deficits: Secondary | ICD-10-CM | POA: Diagnosis not present

## 2021-07-19 LAB — BASIC METABOLIC PANEL
Anion gap: 9 (ref 5–15)
BUN: 67 mg/dL — ABNORMAL HIGH (ref 8–23)
CO2: 29 mmol/L (ref 22–32)
Calcium: 10.4 mg/dL — ABNORMAL HIGH (ref 8.9–10.3)
Chloride: 98 mmol/L (ref 98–111)
Creatinine, Ser: 1.82 mg/dL — ABNORMAL HIGH (ref 0.44–1.00)
GFR, Estimated: 27 mL/min — ABNORMAL LOW (ref >60)
Glucose, Bld: 236 mg/dL — ABNORMAL HIGH (ref 70–99)
Potassium: 4.4 mmol/L (ref 3.5–5.1)
Sodium: 136 mmol/L (ref 135–145)

## 2021-07-19 LAB — CBC
HCT: 40.8 % (ref 36.0–46.0)
Hemoglobin: 12.8 g/dL (ref 12.0–15.0)
MCH: 30.5 pg (ref 26.0–34.0)
MCHC: 31.4 g/dL (ref 30.0–36.0)
MCV: 97.1 fL (ref 80.0–100.0)
Platelets: 161 10*3/uL (ref 150–400)
RBC: 4.2 MIL/uL (ref 3.87–5.11)
RDW: 13.8 % (ref 11.5–15.5)
WBC: 5.7 10*3/uL (ref 4.0–10.5)
nRBC: 0 % (ref 0.0–0.2)

## 2021-07-19 LAB — BRAIN NATRIURETIC PEPTIDE: B Natriuretic Peptide: 53 pg/mL (ref 0.0–100.0)

## 2021-07-19 NOTE — Patient Instructions (Signed)
Labs done today, we will call you for abnormal results  Your physician recommends that you schedule a follow-up appointment in: 3 months  If you have any questions or concerns before your next appointment please send [place] a message through [place] or call our office at [phone].    TO LEAVE A MESSAGE FOR THE NURSE SELECT OPTION 2, PLEASE LEAVE A MESSAGE INCLUDING: YOUR NAME DATE OF BIRTH CALL BACK NUMBER REASON FOR CALL**this is important as we prioritize the call backs  YOU WILL RECEIVE A CALL BACK THE SAME DAY AS LONG AS YOU CALL BEFORE 4:00 PM  At the [facility], you and your health needs are our priority. As part of our continuing mission to provide you with exceptional heart care, we have created designated Provider Care Teams. These Care Teams include your primary Cardiologist (physician) and Advanced Practice Providers (APPs- Physician Assistants and Nurse Practitioners) who all work together to provide you with the care you need, when you need it.   You may see any of the following providers on your designated Care Team at your next follow up: Dr Glori Bickers Dr Loralie Champagne Dr Patrice Paradise, NP Lyda Jester, [place] Ginnie Smart Audry Riles, PharmD   Please be sure to bring in all your medications bottles to every appointment.

## 2021-07-20 NOTE — Progress Notes (Signed)
ID:  [name], DOB 1940/12/16, MRN [id number]   Provider location: [facility] Type of Visit: Established patient   PCP:  Alcus Dad, MD  Cardiologist:  Fransico Him, MD Primary HF: Dr. Aundra Dubin   History of Present Illness: [name] is a 80 y.o. female who has a history of CAD status post CABG 2009, ischemic cardiomyopathy with chronic systolic CHF, persistent atrial fibrillation with stroke in 03/2016 when INR was subtherapeutic, stage III CKD, morbid obesity, pulmonary hypertension, diabetes, GI bleed 03/2017, and severe sleep apnea.    Seen in [facility] clinics 09/10/18 and 09/24/18, both times with volume overload and marked peripheral edema with skin breakdown. Torsemide increased at each visit. Echo in 7/19 showed EF 45-50% with mild RV dilation/mild RV systolic dysfunction and D-shaped interventricular septum.    AKI earlier in 7/20 in setting of excessive torsemide, she had increased dose to 60 qam/40 qpm on her own.  This was cut back to 40 mg daily and losartan and spironolactone were stopped.   Echo in 9/20 showed EF 45% with mildly decreased RV systolic function, PASP 46 mmHg with dilated IVC.  PYP scan in 10/21 was negative.   Echo in 4/22 showed EF 45% with diffuse hypokinesis, mildly decreased RV systolic function, PASP 47, IVC dilated, moderate TR, mild MR.   She returns today for followup of CHF.  Weight is down 5 lbs.  Main complaint is right shoulder pain, thinks it is her rotator cuff.   She is short of breath after walking about 100 yards with her walker.  No chest pain.  No orthopnea/PND.  Feels like she is doing better in general.    ECG (personally reviewed): Atrial fibrillation rate 60s.   Labs (12/18): K 3.9, Creatinine 1.65 Labs (5/19): K 4.1, creatinine 1.7 Labs (6/19): hgb 8.2 Labs (8/19): K 4.3, creatinine 1.73 Labs (9/19): K 4, creatinine 1.56 Labs (11/19): K 4.7, creatinine 2.04 Labs (12/19): creatinine 1.44 Labs  (7/20): K 5, creatinine 2.49 => 1.86, Hgb 11.1 Labs (8/20): K 3.9, creatinine 1.79 Labs (9/20): LDL 50 Labs (11/20): K 3.8, creatinine 2.01 Labs (2/21): K 4, creatinine 1.9 Labs (7/21): K 4.2, creatinine 1.84, hgb 11.2 Labs (9/21): myeloma panel negative, urine immunofixation negative, LDL 50, K 4.6, creatinine 1.76 Labs (3/22): hgb 13.4, K 4.3, creatinine 1.86 Labs (5/22): K 4.5, creatinine 1.98  Review of systems complete and found to be negative unless listed in HPI.    Past Medical History 1. Chronic systolic CHF: Ischemic cardiomyopathy with prominent RV failure.   - Echo (11/18): LVEF 35-40%, severe RV dilation, severe RAE, severe TR.  - Echo (7/19): EF 45-50%, diffuse hypokinesis, mild RV dilation with mildly decreased RV systolic function, D-shaped interventricular septum suggestive of RV pressure/volume overload, mild MR, moderate TR, PASP 49 mmhg.  - Echo (9/20): EF 45%, mild LV dilation, mild RV dilation with mildly decreased systolic function, PASP 46 mmHg, IVC dilated.  - PYP scan (10/21): Negative.  - Echo (4/22): EF 45% with diffuse hypokinesis, mildly decreased RV systolic function, PASP 47, IVC dilated, moderate TR, mild MR. 2. CAD: CABG 2009. No angiography since that time.   3. HTN 4. Atrial fibrillation: Chronic since 2013.  5. CKD stage 3 6. Morbid obesity 7. OSA 8. Chronic venous stasis with RLE wound/[place] 9. CVA 5/18.   Current Outpatient Medications  Medication Sig Dispense Refill   acetaminophen (TYLENOL 8 HOUR) 650 MG CR tablet Take 1 tablet (  650 mg total) by mouth every 8 (eight) hours as needed for pain. 30 tablet 0   apixaban (ELIQUIS) 5 MG TABS tablet Take 1 tablet (5 mg total) by mouth 2 (two) times daily. 180 tablet 3   atorvastatin (LIPITOR) 40 MG tablet Take 40 mg by mouth daily.     Blood Glucose Monitoring Suppl (ONE TOUCH ULTRA 2) w/Device KIT USE TO CHECK BLOOD SUGAR UPTO 3 TIMES A DAY 1 kit 0   carvedilol (COREG) 6.25 MG tablet Take 6.25 mg  by mouth 2 (two) times daily with a meal.     cholecalciferol (VITAMIN D3) 25 MCG (1000 UNIT) tablet Take 1,000 Units by mouth daily.     docusate sodium (COLACE) 100 MG capsule 100 mg.     DULoxetine (CYMBALTA) 20 MG capsule Take 20 mg by mouth daily.     empagliflozin (JARDIANCE) 10 MG TABS tablet Take 1 tablet (10 mg total) by mouth daily before breakfast. 90 tablet 3   ferrous sulfate 325 (65 FE) MG tablet TAKE 1 TABLET BY MOUTH EVERY DAY WITH BREAKFAST 90 tablet 1   glucose blood (ONE TOUCH ULTRA TEST) test strip Use three times a day 100 each 3   Lancets (ONETOUCH DELICA PLUS [id number]) MISC 1 application by Does not apply route daily. Please use to check blood sugar up to three times daily. 100 each 6   metolazone (ZAROXOLYN) 2.5 MG tablet Take only as directed by CHF clinic 10 tablet 0   nitroGLYCERIN (NITROSTAT) 0.4 MG SL tablet PLACE 1 TABLET (0.4 MG TOTAL) UNDER THE TONGUE EVERY 5 (FIVE) MINUTES AS NEEDED FOR CHEST PAIN (UP TO 3 DOSES). 15 tablet 5   potassium chloride SA (KLOR-CON) 20 MEQ tablet Take 3 tablets (60 mEq total) by mouth daily. 450 tablet 3   rOPINIRole (REQUIP) 0.5 MG tablet Take 1 mg by mouth at bedtime.     spironolactone (ALDACTONE) 25 MG tablet Take 0.5 tablets (12.5 mg total) by mouth daily. 15 tablet 0   torsemide (DEMADEX) 20 MG tablet Take 3 tablets (60 mg total) by mouth 2 (two) times daily. 180 tablet 0   vitamin B-12 (CYANOCOBALAMIN) 1000 MCG tablet Take 1 tablet (1,000 mcg total) by mouth daily. 90 tablet 1   No current facility-administered medications for this encounter.   Allergies  Allergen Reactions   Benazepril Other (See Comments)    Unknown reaction at age 59-65 - possibly dizziness   Ozempic (0.25 Or 0.5 Mg-Dose) [Semaglutide(0.25 Or 0.5mg -Dos)] Diarrhea    Gi intollerance   Tape     TOLERATES PAPER TAPE ONLY- due to thin skin. NOT ALLERGY PER PT   Angiotensin Receptor Blockers Other (See Comments)    Hypotension reaction    Fe-Succ-C-Thre-B12-Des Stomach Other (See Comments)    Unknown reaction   Sulfamethoxazole-Trimethoprim Other (See Comments)    Unknown reaction   Social History   Socioeconomic History   Marital status: Widowed    Spouse name: Not on file   Number of children: 2   Years of education: 90   Highest education level: Not on file  Occupational History    Comment: retired  Tobacco Use   Smoking status: Never   Smokeless tobacco: Never  Vaping Use   Vaping Use: Never used  Substance and Sexual Activity   Alcohol use: No   Drug use: Never   Sexual activity: Not Currently  Other Topics Concern   Not on file  Social History Narrative   Widowed   01/27/21  Lives with son,  in [place]   2 children   Not routinely exercising   Social Determinants of Health   Financial Resource Strain: Not on file  Food Insecurity: Not on file  Transportation Needs: Not on file  Physical Activity: Not on file  Stress: Not on file  Social Connections: Not on file  Intimate Partner Violence: Not on file   Family History  Problem Relation Age of Onset   Clotting disorder Mother        Cerebral hemorrhage   Other Mother        cerebral hemorrhage   Emphysema Father        COD   Lung cancer Brother    Heart disease Neg Hx    Vitals:   07/19/21 1510  BP: 100/60  Pulse: 72  SpO2: 100%  Weight: 90 kg (198 lb 6.4 oz)     Wt Readings from Last 3 Encounters:  07/19/21 90 kg (198 lb 6.4 oz)  07/14/21 89.9 kg (198 lb 3.2 oz)  06/22/21 94 kg (207 lb 2 oz)    Exam:   General: NAD Neck: JVP 7-8 cm, no thyromegaly or thyroid nodule.  Lungs: Clear to auscultation bilaterally with normal respiratory effort. CV: Nondisplaced PMI.  Heart irregular S1/S2, no S3/S4, no murmur.  1+ ankle edema.  No carotid bruit.  Normal pedal pulses.  Abdomen: Soft, nontender, no hepatosplenomegaly, no distention.  Skin: Intact without lesions or rashes.  Neurologic: Alert and oriented x 3.  Psych: Normal  affect. Extremities: No clubbing or cyanosis.  HEENT: Normal.   ASSESSMENT & PLAN:  1. Chronic systolic CHF with prominent RV failure: Ischemic cardiomyopathy.  Echo in 7/19 showed LV EF 45-50% (improved) with mildly dilated/mildly dysfunction RV.  D-shaped septum suggested RV pressure/volume overload.  Echo in 9/20 showed EF 45%, mildly dysfunctional RV, PASP 46 mmHg with dilated IVC.  PYP scan negative and myeloma workup negative, no evidence for cardiac amyloidosis.  Echo today showed EF 45% with diffuse hypokinesis, mildly decreased RV systolic function, PASP 47, IVC dilated, moderate TR, mild MR.  Weight is down 5 lbs, she does not look volume overloaded on exam today.  NYHA class II.  Overall seems to be doing better.  - Continue Jardiance 10 mg daily.  - Continue torsemide 60 mg bid.  BMET/BNP today.   - Continue Coreg 6.25 mg bid.   - Continue spironolactone 12.5 mg daily.   - We have discussed Cardiomems but since she has been doing quite well recently, will hold off.  If she decompensates in the future, this would be an option.  2. CAD s/p CABG: No chest pain.  - Continue atorvastatin 40 mg daily. Good lipids in 9/21.     - No ASA with stable CAD on Eliquis.  3. Atrial fibrillation: Chronic. Given long-term atrial fibrillation, she is unlikely to successfully cardiovert.  - Continue Eliquis.  Check CBC.  4. CKD stage 3: BMET today.   5. H/o CVA: Continue Eliquis.   6. OSA: She is using CPAP.  7. HTN: BP controlled.    Followup APP 3 months.    Signed, Loralie Champagne, MD  07/20/2021  [facility] [address] [facility] [place] [address] [phone] (office) [phone] (fax)

## 2021-08-03 ENCOUNTER — Telehealth: Payer: Self-pay

## 2021-08-03 NOTE — Telephone Encounter (Signed)
Patient calls nurse line requesting refill of medication to go over sacral area for skin breakdown. Patient states that they were using zinc over area during last hospitalization.    Please advise.   Talbot Grumbling, RN

## 2021-08-08 MED ORDER — ILEX SKIN PROTECTANT 58.3 % EX PSTE: PASTE | Freq: Three times a day (TID) | CUTANEOUS | 0 refills | Status: DC | PRN

## 2021-08-08 NOTE — Telephone Encounter (Signed)
Rx sent for zinc-petrolatum barrier cream. Patient reports her home health nurse looked at the area and there is no skin breakdown. Advised her to schedule office appointment if worsening or no improvement with the zinc cream.

## 2021-08-10 ENCOUNTER — Ambulatory Visit: Payer: Medicare HMO | Admitting: Podiatry

## 2021-08-14 ENCOUNTER — Other Ambulatory Visit: Payer: Self-pay

## 2021-08-14 ENCOUNTER — Ambulatory Visit: Payer: Medicare HMO | Admitting: Podiatry

## 2021-08-14 DIAGNOSIS — E1151 Type 2 diabetes mellitus with diabetic peripheral angiopathy without gangrene: Secondary | ICD-10-CM

## 2021-08-14 DIAGNOSIS — B351 Tinea unguium: Secondary | ICD-10-CM

## 2021-08-14 DIAGNOSIS — E1169 Type 2 diabetes mellitus with other specified complication: Secondary | ICD-10-CM | POA: Diagnosis not present

## 2021-08-14 DIAGNOSIS — R051 Acute cough: Secondary | ICD-10-CM | POA: Diagnosis not present

## 2021-08-14 DIAGNOSIS — Z20828 Contact with and (suspected) exposure to other viral communicable diseases: Secondary | ICD-10-CM | POA: Diagnosis not present

## 2021-08-15 ENCOUNTER — Telehealth: Payer: Self-pay

## 2021-08-15 ENCOUNTER — Telehealth (HOSPITAL_COMMUNITY): Payer: Self-pay | Admitting: *Deleted

## 2021-08-15 NOTE — Telephone Encounter (Signed)
Should be ok to take, will check with Lauren.

## 2021-08-15 NOTE — Telephone Encounter (Signed)
Patient calls nurse line reporting positive covid test at urgent care yesterday in [place]. Patient reports symptoms started on Saturday with cough and congestion. Patient denies fevers, body aches or SOB. Patient reports she was prescribed Molnupiravir by UC, however she would like PCP opinion on starting. Please advise.

## 2021-08-15 NOTE — Telephone Encounter (Signed)
Pt left vm stating she tested positive for covid and was prescribed Molnupiravir. Pt was told to check with Dr.McLean if it was ok to take with eliquis.   Routed to [place] for advice

## 2021-08-16 NOTE — Telephone Encounter (Signed)
Lauren addressed ok for pt to take. Pt aware.

## 2021-08-17 NOTE — Telephone Encounter (Signed)
Returned patient's call. She reports her symptoms are very mild. She has slight productive cough but that's it. No shortness of breath, fever, or other complaints. Fortunately she is fully vaccinated and boosted.  She has already started taking the Molnupiravir at the recommendation of Urgent Care and her cardiologist. I agree this is the appropriate medication for her.  Assisted patient in scheduling an office appointment on 10/21 with Dr. Jeani Hawking to evaluate her sacral area. She reports some redness and pain and no significant improvement with Zinc cream.

## 2021-08-21 NOTE — Progress Notes (Signed)
  Subjective:  Patient ID: [name], female    DOB: 1941-04-10,  MRN: [id number]  Chief Complaint  Patient presents with   debride    DFC -FBS: 160 a!C: na PCP: Dr. Rock Nephew x 1 wk    80 y.o. female presents with the above complaint. History confirmed with patient.   Objective:  Physical Exam: warm, good capillary refill, nail exam onychomycosis of the toenails, no trophic changes or ulcerative lesions, normal DP and non-palp PT pulses, and normal sensory exam. Left Foot: bunion deformity noted and hammertoes   Right Foot: bunion deformity noted and hammertoes    No images are attached to the encounter.  Assessment:   1. Onychomycosis of multiple toenails with type 2 diabetes mellitus and peripheral angiopathy ([place])      Plan:  Patient was evaluated and treated and all questions answered.  Onychomycosis, Diabetes and PAD -Nails debrided x10, meets criteria for at risk foot care   Procedure: Nail Debridement Type of Debridement: manual, sharp debridement. Instrumentation: Nail nipper, rotary burr. Number of Nails: 10    No follow-ups on file.

## 2021-08-24 ENCOUNTER — Other Ambulatory Visit (HOSPITAL_COMMUNITY): Payer: Self-pay | Admitting: Cardiology

## 2021-08-24 MED ORDER — SPIRONOLACTONE 25 MG PO TABS: 12.5000 mg | ORAL_TABLET | Freq: Every day | ORAL | 2 refills | Status: DC

## 2021-08-24 MED ORDER — ATORVASTATIN CALCIUM 40 MG PO TABS: 40.0000 mg | ORAL_TABLET | Freq: Every day | ORAL | 2 refills | Status: DC

## 2021-08-24 MED ORDER — EMPAGLIFLOZIN 10 MG PO TABS: 10.0000 mg | ORAL_TABLET | Freq: Every day | ORAL | 2 refills | Status: DC

## 2021-08-28 ENCOUNTER — Other Ambulatory Visit: Payer: Self-pay | Admitting: Family Medicine

## 2021-08-28 ENCOUNTER — Other Ambulatory Visit (HOSPITAL_COMMUNITY): Payer: Self-pay | Admitting: Cardiology

## 2021-08-31 NOTE — Progress Notes (Signed)
SUBJECTIVE:   CHIEF COMPLAINT / HPI:   Sacral wound check - history of decubitus ulcer of coccyx, stage 2, noted 10/13/2018 - no photos in chart - reports 4-5 weeks history of burning hot poking pain near tailbone, just above gluteal cleft - has not been bed bound or otherwise immobile - has been putting zinc oxide and corn starch on it without much relief - wonders if she is developing a wound - is also on Jardiance for T2DM, wonders if she is getting an infection  Dysuria - reports a couple weeks of suprapubic pressure, discomfort, and urinary frequency - wonders if she has a urinary tract infection - worried about jardiance making urinary tract infection, making the above sacral wound worse - last A1c early September was 7.7%  COVID booster, flu vaccine - patient requests both at this visit  PERTINENT  PMH / PSH:  Past Medical History:  Diagnosis Date   ACC/AHA stage C congestive heart failure due to ischemic cardiomyopathy ([place]) 01/31/2009   Qualifier: Diagnosis of  By: Olevia Perches, MD, Glenetta Hew    Acute cystitis with hematuria 09/01/2020   ANXIETY 01/31/2009   Qualifier: Diagnosis of  By: Lovette Cliche, CNA, Christy     Arthritis    "qwhere" (03/30/2016)   Benign essential HTN    Bilateral carpal tunnel syndrome 02/13/2021   CAD (coronary artery disease) 05/2008   a. s/p CABG in 2009.   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 Postoperative in 2009;  S/P transesophageal echocardiography-guided cardioversion, previously on Amiodarone and Coumadin. b. recurrent in April 2013 -> pt elected rate control strategy, initially Coumadin -> had stroke with subtherapeutic INR in 03/2016 and transitioned to Eliquis.   Macular degeneration    Melena 01/10/2021   Morbid obesity ([place]) 03/26/2017   Morbid obesity ([place]) 03/26/2017   OSA on CPAP    severe OSA with AHI 34/hr   Persistent atrial fibrillation (HCC)    Proliferative diabetic retinopathy of right eye ([place]) 04/17/2021   Found today at delivery of PRP for what was thought to be severe NPDR OD   Rectal bleeding    Thrombocytopenia (HCC)      OBJECTIVE:   LMP  (LMP Unknown)   PHQ-9:  Depression screen [facility] 2/9 09/01/2021 07/14/2021 06/22/2021  Decreased Interest 0 0 0  Down, Depressed, Hopeless 0 0 0  PHQ - 2 Score 0 0 0  Altered sleeping 0 0 0  Tired, decreased energy 0 0 0  Change in appetite 0 0 0  Feeling bad or failure about yourself  0 0 0  Trouble concentrating 0 0 0  Moving slowly or fidgety/restless 0 0 0  Suicidal thoughts 0 0 0  PHQ-9 Score 0 0 0  Difficult doing work/chores - - -  Some recent data might be hidden     GAD-7: No flowsheet data found.   Physical Exam General: Awake, alert, oriented, no acute distress Respiratory: Unlabored respirations, speaking in full sentences, no respiratory distress Extremities: Moving all extremities spontaneously Skin: thickened concentric area of hypertrophy apparent near tip of coccyx without surrounding erythema, induration, or fluctuance, no rashes or other lesions  visible Neuro: Cranial nerves II through X grossly intact Psych: Normal insight and judgement     ASSESSMENT/PLAN:   Decubitus ulcer of coccyx, stage 2 (HCC) History of decubitus ulcer of coccyx, stage 2, noted 10/13/2018. Patient now reports 4-5 week history of burning pain near tailbone. Appears to be directly under scar tissue from previous decubitus ulcer. Zinc oxide and corn starch at home without relief. Does not appear to be infected at this time. Will send lidocaine gel for pain relief and order to her pre-existing home health agency to add padding bandages to her current regimen. Follow up one month.   Urinary frequency Patient concerned for UTI on Jardiance given sacral sore as discussed above. Also reports suprapubic pressure and discomfort with urination. UA with micro inconclusive but did show pyruria. Will send urine for culture before prescribing antibiotics. Discussed with patient.   Health care maintenance Administered COVID and flu vaccines today. Tolerated well.      Ezequiel Essex, MD [place]

## 2021-09-01 ENCOUNTER — Other Ambulatory Visit: Payer: Self-pay

## 2021-09-01 ENCOUNTER — Ambulatory Visit (INDEPENDENT_AMBULATORY_CARE_PROVIDER_SITE_OTHER): Payer: Medicare HMO

## 2021-09-01 ENCOUNTER — Ambulatory Visit (INDEPENDENT_AMBULATORY_CARE_PROVIDER_SITE_OTHER): Payer: Medicare HMO | Admitting: Family Medicine

## 2021-09-01 ENCOUNTER — Encounter: Payer: Self-pay | Admitting: Family Medicine

## 2021-09-01 DIAGNOSIS — L89156 Pressure-induced deep tissue damage of sacral region: Secondary | ICD-10-CM

## 2021-09-01 DIAGNOSIS — Z0000 Encounter for general adult medical examination without abnormal findings: Secondary | ICD-10-CM

## 2021-09-01 DIAGNOSIS — R8281 Pyuria: Secondary | ICD-10-CM | POA: Diagnosis not present

## 2021-09-01 DIAGNOSIS — R350 Frequency of micturition: Secondary | ICD-10-CM | POA: Insufficient documentation

## 2021-09-01 DIAGNOSIS — L89152 Pressure ulcer of sacral region, stage 2: Secondary | ICD-10-CM | POA: Diagnosis not present

## 2021-09-01 DIAGNOSIS — Z23 Encounter for immunization: Secondary | ICD-10-CM | POA: Diagnosis not present

## 2021-09-01 DIAGNOSIS — R300 Dysuria: Secondary | ICD-10-CM | POA: Diagnosis not present

## 2021-09-01 LAB — POCT URINALYSIS DIP (MANUAL ENTRY)
Bilirubin, UA: NEGATIVE
Glucose, UA: 250 mg/dL — AB
Ketones, POC UA: NEGATIVE mg/dL
Nitrite, UA: NEGATIVE
Protein Ur, POC: NEGATIVE mg/dL
Spec Grav, UA: 1.01 (ref 1.010–1.025)
Urobilinogen, UA: 0.2 E.U./dL
pH, UA: 6 (ref 5.0–8.0)

## 2021-09-01 LAB — POCT UA - MICROSCOPIC ONLY

## 2021-09-01 MED ORDER — LIDOCAINE 5 % EX OINT: 1.0000 "application " | TOPICAL_OINTMENT | CUTANEOUS | 0 refills | Status: DC | PRN

## 2021-09-01 NOTE — Assessment & Plan Note (Signed)
Patient concerned for UTI on Jardiance given sacral sore as discussed above. Also reports suprapubic pressure and discomfort with urination. UA with micro inconclusive but did show pyruria. Will send urine for culture before prescribing antibiotics. Discussed with patient.

## 2021-09-01 NOTE — Patient Instructions (Signed)
It was wonderful to meet you today. Thank you for allowing me to be a part of your care. Below is a short summary of what we discussed at your visit today:  Bottom sore - Use the lidocaine gel for topical pain relief - Make sure to apply the padding bandage to the area - I will send an order to your home health nurse to help with this wound care - Come back in one month for a recheck  Urinary frequency, pressure, discomfort - Today your urine sample came back concerning for possible infection - I will send off your urine for culture to see if any bacteria grow - If bacteria grow, I will send you a medicine to treat it  Vaccines Today you received the annual flu vaccine and Covid booster. You may experience some residual soreness at the injection site.  Gentle stretches and regular use of that arm will help speed up your recovery.  As the vaccines are giving your immune system a "practice run" against specific infections, you may feel a little under the weather for the next several days.  We recommend rest as needed and hydrating.    Please bring all of your medications to every appointment!  If you have any questions or concerns, please do not hesitate to contact us via phone or MyChart message.   Ezequiel Essex, MD

## 2021-09-01 NOTE — Assessment & Plan Note (Signed)
Administered COVID and flu vaccines today. Tolerated well.

## 2021-09-01 NOTE — Assessment & Plan Note (Addendum)
History of decubitus ulcer of coccyx, stage 2, noted 10/13/2018. Patient now reports 4-5 week history of burning pain near tailbone. Appears to be directly under scar tissue from previous decubitus ulcer. Zinc oxide and corn starch at home without relief. Does not appear to be infected at this time. Will send lidocaine gel for pain relief and order to her pre-existing home health agency to add padding bandages to her current regimen. Follow up one month.

## 2021-09-04 LAB — URINE CULTURE

## 2021-09-06 ENCOUNTER — Telehealth: Payer: Self-pay | Admitting: Family Medicine

## 2021-09-06 DIAGNOSIS — N3000 Acute cystitis without hematuria: Secondary | ICD-10-CM

## 2021-09-06 MED ORDER — NITROFURANTOIN MONOHYD MACRO 100 MG PO CAPS
100.0000 mg | ORAL_CAPSULE | Freq: Two times a day (BID) | ORAL | 0 refills | Status: AC
Start: 2021-09-06 — End: 2021-09-11

## 2021-09-06 NOTE — Progress Notes (Signed)
Surgical Instructions    Your procedure is scheduled on 09/15/21.  Report to [facility] Main Entrance "A" at 11:00 A.M., then check in with the Admitting office.  Call this number if you have problems the morning of surgery:  [phone]   If you have any questions prior to your surgery date call [phone]: Open Monday-Friday 8am-4pm    Remember:  Do not eat after midnight the night before your surgery  You may drink clear liquids until 10:00am the morning of your surgery.   Clear liquids allowed are: Water, Non-Citrus Juices (without pulp), Carbonated Beverages, Clear Tea, Black Coffee ONLY (NO MILK, CREAM OR POWDERED CREAMER of any kind), and Gatorade  Patient Instructions  The night before surgery:  No food after midnight. ONLY clear liquids after midnight   The day of surgery (if you have diabetes): Drink ONE (1) 12 oz G2 given to you in your pre admission testing appointment by 10:00am the morning of surgery. Drink in one sitting. Do not sip.  This drink was given to you during your hospital  pre-op appointment visit.  Nothing else to drink after completing the  12 oz bottle of G2.         If you have questions, please contact your surgeon's office.     Take these medicines the morning of surgery with A SIP OF WATER  atorvastatin (LIPITOR) carvedilol (COREG) DULoxetine (CYMBALTA) nitrofurantoin, macrocrystal-monohydrate, (MACROBID)   IF NEEDED: acetaminophen (TYLENOL 8 HOUR)  As of today, STOP taking any Aspirin (unless otherwise instructed by your surgeon) Aleve, Naproxen, Ibuprofen, Motrin, Advil, Goody's, BC's, all herbal medications, fish oil, and all vitamins.  Please follow your surgeons instructions on when to stop taking apixaban (ELIQUIS). If no instructions were given to you, you need to call the surgeons office.    WHAT DO I DO ABOUT MY DIABETES MEDICATION?   Do not take oral diabetes medicines (pills) the morning of surgery.  THE DAY BEFORE  SURGERY, do not take empagliflozin (JARDIANCE).      THE MORNING OF SURGERY, do not take empagliflozin (JARDIANCE).  The day of surgery, do not take other diabetes injectables, including Byetta (exenatide), Bydureon (exenatide ER), Victoza (liraglutide), or Trulicity (dulaglutide).  If your CBG is greater than 220 mg/dL, you may take  of your sliding scale (correction) dose of insulin.   HOW TO MANAGE YOUR DIABETES BEFORE AND AFTER SURGERY  Why is it important to control my blood sugar before and after surgery? Improving blood sugar levels before and after surgery helps healing and can limit problems. A way of improving blood sugar control is eating a healthy diet by:  Eating less sugar and carbohydrates  Increasing activity/exercise  Talking with your doctor about reaching your blood sugar goals High blood sugars (greater than 180 mg/dL) can raise your risk of infections and slow your recovery, so you will need to focus on controlling your diabetes during the weeks before surgery. Make sure that the doctor who takes care of your diabetes knows about your planned surgery including the date and location.  How do I manage my blood sugar before surgery? Check your blood sugar at least 4 times a day, starting 2 days before surgery, to make sure that the level is not too high or low.  Check your blood sugar the morning of your surgery when you wake up and every 2 hours until you get to the Short Stay unit.  If your blood sugar is less than 70 mg/dL, you will  need to treat for low blood sugar: Do not take insulin. Treat a low blood sugar (less than 70 mg/dL) with  cup of clear juice (cranberry or apple), 4 glucose tablets, OR glucose gel. Recheck blood sugar in 15 minutes after treatment (to make sure it is greater than 70 mg/dL). If your blood sugar is not greater than 70 mg/dL on recheck, call [phone] for further instructions. Report your blood sugar to the short stay nurse when you  get to Short Stay.  If you are admitted to the hospital after surgery: Your blood sugar will be checked by the staff and you will probably be given insulin after surgery (instead of oral diabetes medicines) to make sure you have good blood sugar levels. The goal for blood sugar control after surgery is 80-180 mg/dL.    After your COVID test   You are not required to quarantine however you are required to wear a well-fitting mask when you are out and around people not in your household.  If your mask becomes wet or soiled, replace with a new one.  Wash your hands often with soap and water for 20 seconds or clean your hands with an alcohol-based hand sanitizer that contains at least 60% alcohol.  Do not share personal items.  Notify your provider: if you are in close contact with someone who has COVID  or if you develop a fever of 100.4 or greater, sneezing, cough, sore throat, shortness of breath or body aches.             Do not wear jewelry or makeup Do not wear lotions, powders, perfumes/colognes, or deodorant. Do not shave 48 hours prior to surgery.  Men may shave face and neck. Do not bring valuables to the hospital. DO Not wear nail polish, gel polish, artificial nails, or any other type of covering on natural nails including finger and toenails. If patients have artificial nails, gel coating, etc. that need to be removed by a nail salon, please have this removed prior to surgery or surgery may need to be canceled/delayed if the surgeon/ anesthesia feels like the patient is unable to be adequately monitored.             [facility] is not responsible for any belongings or valuables.  Do NOT Smoke (Tobacco/Vaping)  24 hours prior to your procedure  If you use a CPAP at night, you may bring your mask for your overnight stay.   Contacts, glasses, hearing aids, dentures or partials may not be worn into surgery, please bring cases for these belongings   For patients admitted to  the hospital, discharge time will be determined by your treatment team.   Patients discharged the day of surgery will not be allowed to drive home, and someone needs to stay with them for 24 hours.  NO VISITORS WILL BE ALLOWED IN PRE-OP WHERE PATIENTS ARE PREPPED FOR SURGERY.  ONLY 1 SUPPORT PERSON MAY BE PRESENT IN THE WAITING ROOM WHILE YOU ARE IN SURGERY.  IF YOU ARE TO BE ADMITTED, ONCE YOU ARE IN YOUR ROOM YOU WILL BE ALLOWED TWO (2) VISITORS. 1 (ONE) VISITOR MAY STAY OVERNIGHT BUT MUST ARRIVE TO THE ROOM BY 8pm.  Minor children may have two parents present. Special consideration for safety and communication needs will be reviewed on a case by case basis.  Special instructions:    Oral Hygiene is also important to reduce your risk of infection.  Remember - BRUSH YOUR TEETH THE MORNING OF  SURGERY WITH YOUR REGULAR TOOTHPASTE   [facility]- Preparing For Surgery  Before surgery, you can play an important role. Because skin is not sterile, your skin needs to be as free of germs as possible. You can reduce the number of germs on your skin by washing with CHG (chlorahexidine gluconate) Soap before surgery.  CHG is an antiseptic cleaner which kills germs and bonds with the skin to continue killing germs even after washing.     Please do not use if you have an allergy to CHG or antibacterial soaps. If your skin becomes reddened/irritated stop using the CHG.  Do not shave (including legs and underarms) for at least 48 hours prior to first CHG shower. It is OK to shave your face.  Please follow these instructions carefully.     Shower the NIGHT BEFORE SURGERY and the MORNING OF SURGERY with CHG Soap.   If you chose to wash your hair, wash your hair first as usual with your normal shampoo. After you shampoo, rinse your hair and body thoroughly to remove the shampoo.  Then [employer] and genitals (private parts) with your normal soap and rinse thoroughly to remove soap.  After that Use CHG Soap as  you would any other liquid soap. You can apply CHG directly to the skin and wash gently with a scrungie or a clean washcloth.   Apply the CHG Soap to your body ONLY FROM THE NECK DOWN.  Do not use on open wounds or open sores. Avoid contact with your eyes, ears, mouth and genitals (private parts). Wash Face and genitals (private parts)  with your normal soap.   Wash thoroughly, paying special attention to the area where your surgery will be performed.  Thoroughly rinse your body with warm water from the neck down.  DO NOT shower/wash with your normal soap after using and rinsing off the CHG Soap.  Pat yourself dry with a CLEAN TOWEL.  Wear CLEAN PAJAMAS to bed the night before surgery  Place CLEAN SHEETS on your bed the night before your surgery  DO NOT SLEEP WITH PETS.   Day of Surgery: Take a shower with CHG soap. Wear Clean/Comfortable clothing the morning of surgery Do not apply any deodorants/lotions.   Remember to brush your teeth WITH YOUR REGULAR TOOTHPASTE.   Please read over the following fact sheets that you were given.

## 2021-09-06 NOTE — Telephone Encounter (Signed)
-----   Message from Alcus Dad, MD sent at 09/05/2021  7:41 PM EDT -----  ----- Message ----- From: [place] Pink, CMA Sent: 09/01/2021   3:34 PM EDT To: Alcus Dad, MD

## 2021-09-06 NOTE — Telephone Encounter (Signed)
Called patient. Will send macrobid 500 mg BID x5 days for UTI. All questions answered.   Ezequiel Essex, MD

## 2021-09-07 ENCOUNTER — Encounter (HOSPITAL_COMMUNITY)
Admission: RE | Admit: 2021-09-07 | Discharge: 2021-09-07 | Disposition: A | Discharge status: Home / Self Care | Payer: Medicare HMO | Source: Ambulatory Visit | Attending: Orthopedic Surgery | Admitting: Orthopedic Surgery

## 2021-09-07 ENCOUNTER — Other Ambulatory Visit: Payer: Self-pay

## 2021-09-07 ENCOUNTER — Encounter (HOSPITAL_COMMUNITY): Payer: Self-pay

## 2021-09-07 VITALS — BP 129/59 | HR 60 | Temp 97.9°F | Resp 18 | Ht 69.0 in | Wt 198.2 lb

## 2021-09-07 DIAGNOSIS — I2510 Atherosclerotic heart disease of native coronary artery without angina pectoris: Secondary | ICD-10-CM | POA: Diagnosis not present

## 2021-09-07 DIAGNOSIS — I255 Ischemic cardiomyopathy: Secondary | ICD-10-CM | POA: Diagnosis not present

## 2021-09-07 DIAGNOSIS — E1122 Type 2 diabetes mellitus with diabetic chronic kidney disease: Secondary | ICD-10-CM | POA: Diagnosis not present

## 2021-09-07 DIAGNOSIS — K219 Gastro-esophageal reflux disease without esophagitis: Secondary | ICD-10-CM | POA: Diagnosis not present

## 2021-09-07 DIAGNOSIS — Z7901 Long term (current) use of anticoagulants: Secondary | ICD-10-CM | POA: Diagnosis not present

## 2021-09-07 DIAGNOSIS — I482 Chronic atrial fibrillation, unspecified: Secondary | ICD-10-CM | POA: Diagnosis not present

## 2021-09-07 DIAGNOSIS — Z8616 Personal history of COVID-19: Secondary | ICD-10-CM | POA: Insufficient documentation

## 2021-09-07 DIAGNOSIS — G4733 Obstructive sleep apnea (adult) (pediatric): Secondary | ICD-10-CM | POA: Insufficient documentation

## 2021-09-07 DIAGNOSIS — Z7984 Long term (current) use of oral hypoglycemic drugs: Secondary | ICD-10-CM | POA: Insufficient documentation

## 2021-09-07 DIAGNOSIS — Z862 Personal history of diseases of the blood and blood-forming organs and certain disorders involving the immune mechanism: Secondary | ICD-10-CM

## 2021-09-07 DIAGNOSIS — Z01812 Encounter for preprocedural laboratory examination: Secondary | ICD-10-CM | POA: Insufficient documentation

## 2021-09-07 DIAGNOSIS — G5601 Carpal tunnel syndrome, right upper limb: Secondary | ICD-10-CM | POA: Diagnosis not present

## 2021-09-07 DIAGNOSIS — G894 Chronic pain syndrome: Secondary | ICD-10-CM | POA: Insufficient documentation

## 2021-09-07 DIAGNOSIS — E1142 Type 2 diabetes mellitus with diabetic polyneuropathy: Secondary | ICD-10-CM | POA: Diagnosis not present

## 2021-09-07 DIAGNOSIS — Z8673 Personal history of transient ischemic attack (TIA), and cerebral infarction without residual deficits: Secondary | ICD-10-CM | POA: Diagnosis not present

## 2021-09-07 DIAGNOSIS — N184 Chronic kidney disease, stage 4 (severe): Secondary | ICD-10-CM | POA: Insufficient documentation

## 2021-09-07 DIAGNOSIS — Z951 Presence of aortocoronary bypass graft: Secondary | ICD-10-CM | POA: Diagnosis not present

## 2021-09-07 DIAGNOSIS — I130 Hypertensive heart and chronic kidney disease with heart failure and stage 1 through stage 4 chronic kidney disease, or unspecified chronic kidney disease: Secondary | ICD-10-CM | POA: Insufficient documentation

## 2021-09-07 DIAGNOSIS — E785 Hyperlipidemia, unspecified: Secondary | ICD-10-CM | POA: Diagnosis not present

## 2021-09-07 DIAGNOSIS — I5022 Chronic systolic (congestive) heart failure: Secondary | ICD-10-CM | POA: Insufficient documentation

## 2021-09-07 DIAGNOSIS — Z79899 Other long term (current) drug therapy: Secondary | ICD-10-CM | POA: Insufficient documentation

## 2021-09-07 LAB — CBC
HCT: 40.7 % (ref 36.0–46.0)
Hemoglobin: 13.1 g/dL (ref 12.0–15.0)
MCH: 31 pg (ref 26.0–34.0)
MCHC: 32.2 g/dL (ref 30.0–36.0)
MCV: 96.4 fL (ref 80.0–100.0)
Platelets: 137 10*3/uL — ABNORMAL LOW (ref 150–400)
RBC: 4.22 MIL/uL (ref 3.87–5.11)
RDW: 13.6 % (ref 11.5–15.5)
WBC: 6.5 10*3/uL (ref 4.0–10.5)
nRBC: 0 % (ref 0.0–0.2)

## 2021-09-07 LAB — BASIC METABOLIC PANEL
Anion gap: 10 (ref 5–15)
BUN: 71 mg/dL — ABNORMAL HIGH (ref 8–23)
CO2: 28 mmol/L (ref 22–32)
Calcium: 10.2 mg/dL (ref 8.9–10.3)
Chloride: 97 mmol/L — ABNORMAL LOW (ref 98–111)
Creatinine, Ser: 2.11 mg/dL — ABNORMAL HIGH (ref 0.44–1.00)
GFR, Estimated: 23 mL/min — ABNORMAL LOW (ref >60)
Glucose, Bld: 157 mg/dL — ABNORMAL HIGH (ref 70–99)
Potassium: 4.2 mmol/L (ref 3.5–5.1)
Sodium: 135 mmol/L (ref 135–145)

## 2021-09-07 LAB — HEMOGLOBIN A1C
Hgb A1c MFr Bld: 7.6 % — ABNORMAL HIGH (ref 4.8–5.6)
Mean Plasma Glucose: 171.42 mg/dL

## 2021-09-07 LAB — GLUCOSE, CAPILLARY: Glucose-Capillary: 161 mg/dL — ABNORMAL HIGH (ref 70–99)

## 2021-09-07 NOTE — Progress Notes (Signed)
PCP - Dr. Alcus Dad Cardiologist - Dr. Loralie Champagne  PPM/ICD - denies   Chest x-ray - 10/06/2018 EKG - 07/19/21 Stress Test - denies ECHO - 02/28/21 Cardiac Cath - 05/2008  Sleep Study - 07/2017. Pt states she was diagnosed with sleep apnea, but unable to tolerate CPAP  DM- Type 2 Fasting Blood Sugar - 145-160 Checks Blood Sugar once a day  Blood Thinner Instructions: pt instructed to contact surgeon for instructions on Eliquis Aspirin Instructions: n/a  ERAS Protcol - yes PRE-SURGERY G2-  given  COVID TEST- n/a, ambulatory surgery   Anesthesia review: yes, cardiac hx  Patient denies shortness of breath, fever, cough and chest pain at PAT appointment   All instructions explained to the patient, with a verbal understanding of the material. Patient agrees to go over the instructions while at home for a better understanding. Patient also instructed to wear a mask in public after being tested for COVID-19. The opportunity to ask questions was provided.

## 2021-09-08 NOTE — Anesthesia Preprocedure Evaluation (Addendum)
Anesthesia Evaluation  Patient identified by MRN, date of birth, ID band Patient awake    Reviewed: Allergy & Precautions, NPO status , Patient's Chart, lab work & pertinent test results, reviewed documented beta blocker date and time   History of Anesthesia Complications Negative for: history of anesthetic complications  Airway Mallampati: III  TM Distance: >3 FB Neck ROM: Full    Dental no notable dental hx.    Pulmonary sleep apnea ,    Pulmonary exam normal        Cardiovascular hypertension, Pt. on medications and Pt. on home beta blockers + CAD, + CABG (2009) and +CHF  Normal cardiovascular exam+ dysrhythmias Atrial Fibrillation   TTE 02/28/21: EF 45%, global hypokinesis, RV systolic function mildly reduced, moderately elevated PASP, RVSP 46.3mHg, moderate LAE, mild RAE, mild MR, moderate TR, mild AR   Neuro/Psych Anxiety CVA    GI/Hepatic Neg liver ROS, GERD  ,  Endo/Other  diabetes, Type 2, Oral Hypoglycemic Agents  Renal/GU Renal InsufficiencyRenal disease (Cr 2.11)  negative genitourinary   Musculoskeletal  (+) Arthritis ,   Abdominal   Peds  Hematology Plt 137k   Anesthesia Other Findings Day of surgery medications reviewed with patient.  Reproductive/Obstetrics negative OB ROS                          Anesthesia Physical Anesthesia Plan  ASA: 3  Anesthesia Plan: MAC and Regional   Post-op Pain Management:    Induction:   PONV Risk Score and Plan: 2 and Treatment may vary due to age or medical condition, Propofol infusion and Ondansetron  Airway Management Planned: Natural Airway and Simple Face Mask  Additional Equipment: None  Intra-op Plan:   Post-operative Plan:   Informed Consent: I have reviewed the patients History and Physical, chart, labs and discussed the procedure including the risks, benefits and alternatives for the proposed anesthesia with the patient or  authorized representative who has indicated his/her understanding and acceptance.     Dental advisory given  Plan Discussed with: CRNA  Anesthesia Plan Comments: (PAT note written by AMyra Gianotti PA-C. )      Anesthesia Quick Evaluation

## 2021-09-08 NOTE — Progress Notes (Addendum)
Anesthesia Chart Review:  Case: [id number] Date/Time: 09/15/21 1245   Procedure: RIGHT CARPAL TUNNEL RELEASE (Right)   Anesthesia type: Regional   Pre-op diagnosis: RIGHT CARPAL TUNNEL SYNDROME   Location: [facility] OR ROOM 04 / [facility]   Surgeons: Daryll Brod, MD       DISCUSSION: Patient is an 80 year old female scheduled for the above procedure. She is s/p left CTR and excision of left thumb mass (benign neuroma) on 03/28/21.   History includes never smoker, HTN, CAD (CABG x5 05/17/08: LIMA-LAD-DIAG, SVG-CX, SVG-PDA-PLA), ischemic cardiomyopathy, chronic systolic CHF ("with prominent RV failure"; "PYP scan negative and myeloma workup negative, no evidence for cardiac amyloidosis"), atrial fibrillation (chronic since 2013), CVA (03/2016), DM2, HLD, CKD (stage IIII), chronic pain syndrome, thrombocytopenia (appears mild, intermittent since 2017), GI bleed 03/2017, likely diverticular bleed in setting of Eliquis), GERD, OSA ("severe with AHI 34/hr"; reported current intolerance to CPAP due to nose bleeds). + COVID-19 08/14/21.    Last visit with HF cardiologist Dr. Aundra Dubin 07/19/21. Weight down and did not look volume overloaded on exam. Overall she felt she was doing better. Continued current medications. Held off on considering Cardiomems since she was doing "quite well recently".  Continue Eliquis for Afib. 3 month follow-up planned. He had previously cleared her for left CTR in May with permission to hold Eliquis for 2 days prior to surgery. At PAT, she reportedly did not know whether she was to hold Eliquis or not and was going to clarify with Dr. Fredna Dow.    Creatinine 2.11, BUN 71, eGFR 23, previous Creatinine 12/12/20-07/19/21 1.74-2.07 with eGFR 24-29, so overall renal function appears stable.    Anesthesia team to evaluate on the day of surgery.  ADDENDUM 09/11/21 11:39 AM: Hassan Rowan from Dr. Levell July office called. Patient is aware to hold Eliquis for 2 days prior to surgery per clearance note by Dr. Aundra Dubin signed  06/27/21. Clearance letter on shadow chart.   VS: BP (!) 129/59   Pulse 60   Temp 36.6 C (Oral)   Resp 18   Ht $R'5\' 9"'dp$  (1.753 m)   Wt 89.9 kg   LMP  (LMP Unknown)   SpO2 100%   BMI 29.27 kg/m   PROVIDERS: Alcus Dad, MD is PCP  Loralie Champagne, MD is HF cardiologist  Fransico Him, MD is cardiologist (for OSA) - Last nephrology visit seen 11/26/18 with Biagio Quint, MD ([facility], [facility]) for follow-up CKD stage III due to DM, HTN, cardiorenal syndrome.    LABS: Labs reviewed: Acceptable for surgery. (all labs ordered are listed, but only abnormal results are displayed)  Labs Reviewed  GLUCOSE, CAPILLARY - Abnormal; Notable for the following components:      Result Value   Glucose-Capillary 161 (*)    All other components within normal limits  HEMOGLOBIN A1C - Abnormal; Notable for the following components:   Hgb A1c MFr Bld 7.6 (*)    All other components within normal limits  BASIC METABOLIC PANEL - Abnormal; Notable for the following components:   Chloride 97 (*)    Glucose, Bld 157 (*)    BUN 71 (*)    Creatinine, Ser 2.11 (*)    GFR, Estimated 23 (*)    All other components within normal limits  CBC - Abnormal; Notable for the following components:   Platelets 137 (*)    All other components within normal limits    EKG:  EKG 07/19/21: Atrial fibrillation at 67 bpm ST & T wave abnormality, consider  inferior ischemia Abnormal ECG No significant change since last tracing Confirmed by Sherren Mocha [phone]) on 07/19/2021 9:58:37 PM  EKG 03/24/21: Atrial fibrillation at 76 bpm Low voltage QRS Non-specific intra-ventricular conduction delay Nonspecific T wave abnormality Abnormal ECG No significant change was found Confirmed by Virl Axe [id number]) on 03/25/2021 1:01:10 PM     CV: Echo 02/28/21: IMPRESSIONS   1. Left ventricular ejection fraction, by estimation, is 45%. The left  ventricle has mildly decreased function. The left ventricle  demonstrates  global hypokinesis. Left ventricular diastolic parameters are  indeterminate.   2. Right ventricular systolic function is mildly reduced. The right  ventricular size is normal. There is moderately elevated pulmonary artery  systolic pressure. The estimated right ventricular systolic pressure is  36.6 mmHg.   3. Left atrial size was moderately dilated.   4. Right atrial size was mildly dilated.   5. The mitral valve is normal in structure. Mild mitral valve  regurgitation. No evidence of mitral stenosis.   6. Tricuspid valve regurgitation is moderate.   7. The aortic valve is tricuspid. Aortic valve regurgitation is mild.  Mild aortic valve sclerosis is present, with no evidence of aortic valve  stenosis.   8. The inferior vena cava is dilated in size with <50% respiratory  variability, suggesting right atrial pressure of 15 mmHg.  - (Comparison: 07/24/19: LVEF 45%, diffuse hypokinesis, mild MR, mildly reduced RV systolic function, mild RVH, PA systolic pressure 46 mmHg)     NM Cardiac Amyloidosis Scan 08/15/20: IMPRESSION: - Visual and quantitative assessment (grade 1, H/CLL equal 0.8) are not suggestive of transthyretin amyloidosis. - Incidental tracer uptake within the thoracic spine is identified suggesting an underlying bone abnormality. This may reflect thoracic compression deformity, inflammation or infection or underlying bone lesion. As an initial workup recommend further evaluation with plain film radiographs of the thoracic spine. Alternatively, CT or MRI may be obtained for more definitive characterization.     Last cardiac cath was pre-CABG (severe 3V CAD, LVEF 20%, elevated pulmonary artery pressure 73/30 with a mean of 48 and elevated pulmonary wedge pressure of 32.). S/p CABG x5 05/17/08.     Past Medical History:  Diagnosis Date   ACC/AHA stage C congestive heart failure due to ischemic cardiomyopathy ([place]) 01/31/2009   Qualifier: Diagnosis of  By:  Olevia Perches, MD, Glenetta Hew    Acute cystitis with hematuria 09/01/2020   ANXIETY 01/31/2009   Qualifier: Diagnosis of  By: Lovette Cliche, CNA, Christy     Arthritis    "qwhere" (03/30/2016)   Benign essential HTN    Bilateral carpal tunnel syndrome 02/13/2021   CAD (coronary artery disease) 05/2008   a. s/p CABG in 2009.   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 Postoperative in 2009;  S/P transesophageal echocardiography-guided cardioversion, previously on Amiodarone and Coumadin. b. recurrent in April 2013 -> pt elected rate control strategy, initially Coumadin -> had stroke with subtherapeutic INR in 03/2016 and transitioned to Eliquis.   Macular degeneration    Melena 01/10/2021   Morbid obesity ([place]) 03/26/2017   Morbid obesity ([place]) 03/26/2017   OSA on CPAP    severe OSA with AHI 34/hr   Persistent atrial fibrillation ([place])    Proliferative diabetic retinopathy of right eye ([place]) 04/17/2021   Found today at delivery of PRP for what was thought to be severe NPDR OD   Rectal bleeding    Thrombocytopenia ([place])      Past Surgical History:  Procedure Laterality Date   APPENDECTOMY  1946   BACK SURGERY     CARPAL TUNNEL RELEASE Left 03/28/2021   Procedure: LEFT CARPAL TUNNEL RELEASE;  Surgeon: Daryll Brod, MD;  Location: [facility];  Service: Orthopedics;  Laterality: Left;  60 MIN   CATARACT EXTRACTION Right 2012   COLONOSCOPY WITH PROPOFOL Left 03/27/2017   Procedure: COLONOSCOPY WITH PROPOFOL;  Surgeon: Ronnette Juniper, MD;  Location: [facility];  Service: Gastroenterology;  Laterality: Left;   CORONARY ANGIOPLASTY WITH STENT PLACEMENT  2009   "put 2 stents in"   EXCISION METACARPAL MASS Left 03/28/2021   Procedure: EXCISION MASS LEFT THUMB;  Surgeon: Daryll Brod, MD;  Location: [facility];  Service: Orthopedics;  Laterality: Left;   EYE SURGERY     bilateral cataract   HERNIA REPAIR     IR LUMBAR DISC ASPIRATION W/IMG GUIDE  10/08/2018   JOINT REPLACEMENT     TEE WITH CARDIOVERSION     Postoperative in 2009;  S/P transesophageal echocardiography-guided cardioversion,   TOTAL KNEE ARTHROPLASTY Right 1994   VENTRAL HERNIA REPAIR  10/1999   With mesh    MEDICATIONS:  lidocaine (XYLOCAINE) 5 % ointment   nitrofurantoin, macrocrystal-monohydrate, (MACROBID) 100 MG capsule   acetaminophen (TYLENOL 8 HOUR) 650 MG CR tablet   apixaban (ELIQUIS) 5 MG TABS tablet   atorvastatin (LIPITOR) 40 MG tablet   Blood Glucose Monitoring Suppl (ONE TOUCH ULTRA 2) w/Device KIT   carvedilol (COREG) 6.25 MG tablet   cholecalciferol (VITAMIN D3) 25 MCG (1000 UNIT) tablet   docusate sodium (COLACE) 100 MG capsule   DULoxetine (CYMBALTA) 20 MG capsule   empagliflozin (JARDIANCE) 10 MG TABS tablet   ferrous sulfate 325 (65 FE) MG tablet   glucose blood (ONE TOUCH ULTRA TEST) test strip   Lancets (ONETOUCH DELICA PLUS [id number]) MISC   metolazone (ZAROXOLYN) 2.5 MG tablet   nitroGLYCERIN (NITROSTAT) 0.4 MG SL tablet   potassium chloride SA (KLOR-CON) 20 MEQ tablet   rOPINIRole (REQUIP) 0.5 MG tablet   Skin Protectants,  Misc. (WHITE PETROLATUM-ZINC) 58.3 % cream   spironolactone (ALDACTONE) 25 MG tablet   torsemide (DEMADEX) 20 MG tablet   vitamin B-12 (CYANOCOBALAMIN) 1000 MCG tablet   No current facility-administered medications for this encounter.    Myra Gianotti, PA-C Surgical Short Stay/Anesthesiology [facility] Phone [phone] [facility] Phone [phone] 09/08/2021 7:20 PM

## 2021-09-13 DIAGNOSIS — E1122 Type 2 diabetes mellitus with diabetic chronic kidney disease: Secondary | ICD-10-CM | POA: Diagnosis not present

## 2021-09-13 DIAGNOSIS — E1142 Type 2 diabetes mellitus with diabetic polyneuropathy: Secondary | ICD-10-CM | POA: Diagnosis not present

## 2021-09-13 DIAGNOSIS — I130 Hypertensive heart and chronic kidney disease with heart failure and stage 1 through stage 4 chronic kidney disease, or unspecified chronic kidney disease: Secondary | ICD-10-CM | POA: Diagnosis not present

## 2021-09-13 DIAGNOSIS — L89152 Pressure ulcer of sacral region, stage 2: Secondary | ICD-10-CM | POA: Diagnosis not present

## 2021-09-13 DIAGNOSIS — N183 Chronic kidney disease, stage 3 unspecified: Secondary | ICD-10-CM | POA: Diagnosis not present

## 2021-09-13 DIAGNOSIS — I872 Venous insufficiency (chronic) (peripheral): Secondary | ICD-10-CM | POA: Diagnosis not present

## 2021-09-13 DIAGNOSIS — I5022 Chronic systolic (congestive) heart failure: Secondary | ICD-10-CM | POA: Diagnosis not present

## 2021-09-13 DIAGNOSIS — I2510 Atherosclerotic heart disease of native coronary artery without angina pectoris: Secondary | ICD-10-CM | POA: Diagnosis not present

## 2021-09-13 DIAGNOSIS — I4811 Longstanding persistent atrial fibrillation: Secondary | ICD-10-CM | POA: Diagnosis not present

## 2021-09-13 DIAGNOSIS — D509 Iron deficiency anemia, unspecified: Secondary | ICD-10-CM | POA: Diagnosis not present

## 2021-09-15 ENCOUNTER — Ambulatory Visit (HOSPITAL_COMMUNITY): Payer: Medicare HMO | Admitting: Physician Assistant

## 2021-09-15 ENCOUNTER — Other Ambulatory Visit: Payer: Self-pay

## 2021-09-15 ENCOUNTER — Ambulatory Visit (HOSPITAL_COMMUNITY): Payer: Medicare HMO | Admitting: Anesthesiology

## 2021-09-15 ENCOUNTER — Encounter (HOSPITAL_COMMUNITY)
Admission: RE | Disposition: A | Discharge status: Home / Self Care | Payer: Self-pay | Source: Home / Self Care | Attending: Orthopedic Surgery

## 2021-09-15 ENCOUNTER — Encounter (HOSPITAL_COMMUNITY): Payer: Self-pay | Admitting: Orthopedic Surgery

## 2021-09-15 ENCOUNTER — Ambulatory Visit (HOSPITAL_COMMUNITY)
Admission: RE | Admit: 2021-09-15 | Discharge: 2021-09-15 | Disposition: A | Discharge status: Home / Self Care | Payer: Medicare HMO | Attending: Orthopedic Surgery | Admitting: Orthopedic Surgery

## 2021-09-15 DIAGNOSIS — Z881 Allergy status to other antibiotic agents status: Secondary | ICD-10-CM | POA: Diagnosis not present

## 2021-09-15 DIAGNOSIS — E1122 Type 2 diabetes mellitus with diabetic chronic kidney disease: Secondary | ICD-10-CM | POA: Insufficient documentation

## 2021-09-15 DIAGNOSIS — E1142 Type 2 diabetes mellitus with diabetic polyneuropathy: Secondary | ICD-10-CM | POA: Insufficient documentation

## 2021-09-15 DIAGNOSIS — Z833 Family history of diabetes mellitus: Secondary | ICD-10-CM | POA: Diagnosis not present

## 2021-09-15 DIAGNOSIS — N183 Chronic kidney disease, stage 3 unspecified: Secondary | ICD-10-CM | POA: Diagnosis not present

## 2021-09-15 DIAGNOSIS — Z955 Presence of coronary angioplasty implant and graft: Secondary | ICD-10-CM | POA: Insufficient documentation

## 2021-09-15 DIAGNOSIS — Z801 Family history of malignant neoplasm of trachea, bronchus and lung: Secondary | ICD-10-CM | POA: Insufficient documentation

## 2021-09-15 DIAGNOSIS — G5603 Carpal tunnel syndrome, bilateral upper limbs: Secondary | ICD-10-CM | POA: Insufficient documentation

## 2021-09-15 DIAGNOSIS — Z888 Allergy status to other drugs, medicaments and biological substances status: Secondary | ICD-10-CM | POA: Diagnosis not present

## 2021-09-15 DIAGNOSIS — I110 Hypertensive heart disease with heart failure: Secondary | ICD-10-CM | POA: Diagnosis not present

## 2021-09-15 DIAGNOSIS — I130 Hypertensive heart and chronic kidney disease with heart failure and stage 1 through stage 4 chronic kidney disease, or unspecified chronic kidney disease: Secondary | ICD-10-CM | POA: Insufficient documentation

## 2021-09-15 DIAGNOSIS — I5022 Chronic systolic (congestive) heart failure: Secondary | ICD-10-CM | POA: Diagnosis not present

## 2021-09-15 DIAGNOSIS — I4891 Unspecified atrial fibrillation: Secondary | ICD-10-CM | POA: Diagnosis not present

## 2021-09-15 DIAGNOSIS — Z7984 Long term (current) use of oral hypoglycemic drugs: Secondary | ICD-10-CM | POA: Insufficient documentation

## 2021-09-15 DIAGNOSIS — D696 Thrombocytopenia, unspecified: Secondary | ICD-10-CM | POA: Diagnosis not present

## 2021-09-15 DIAGNOSIS — Z825 Family history of asthma and other chronic lower respiratory diseases: Secondary | ICD-10-CM | POA: Diagnosis not present

## 2021-09-15 DIAGNOSIS — E119 Type 2 diabetes mellitus without complications: Secondary | ICD-10-CM | POA: Insufficient documentation

## 2021-09-15 DIAGNOSIS — Z7901 Long term (current) use of anticoagulants: Secondary | ICD-10-CM | POA: Insufficient documentation

## 2021-09-15 DIAGNOSIS — Z832 Family history of diseases of the blood and blood-forming organs and certain disorders involving the immune mechanism: Secondary | ICD-10-CM | POA: Insufficient documentation

## 2021-09-15 DIAGNOSIS — G5601 Carpal tunnel syndrome, right upper limb: Secondary | ICD-10-CM | POA: Diagnosis not present

## 2021-09-15 DIAGNOSIS — I509 Heart failure, unspecified: Secondary | ICD-10-CM | POA: Insufficient documentation

## 2021-09-15 DIAGNOSIS — G4733 Obstructive sleep apnea (adult) (pediatric): Secondary | ICD-10-CM | POA: Diagnosis not present

## 2021-09-15 DIAGNOSIS — Z9989 Dependence on other enabling machines and devices: Secondary | ICD-10-CM | POA: Diagnosis not present

## 2021-09-15 HISTORY — PX: CARPAL TUNNEL RELEASE: SHX101

## 2021-09-15 LAB — GLUCOSE, CAPILLARY
Glucose-Capillary: 156 mg/dL — ABNORMAL HIGH (ref 70–99)
Glucose-Capillary: 145 mg/dL — ABNORMAL HIGH (ref 70–99)

## 2021-09-15 SURGERY — CARPAL TUNNEL RELEASE
Anesthesia: Monitor Anesthesia Care | Laterality: Right

## 2021-09-15 MED ORDER — SODIUM CHLORIDE 0.9 % IV SOLN: INTRAVENOUS | Status: DC

## 2021-09-15 MED ORDER — PROPOFOL 500 MG/50ML IV EMUL
INTRAVENOUS | Status: DC | PRN
  Administered 2021-09-15: 80 ug/kg/min via INTRAVENOUS

## 2021-09-15 MED ORDER — CHLORHEXIDINE GLUCONATE 0.12 % MT SOLN
15.0000 mL | Freq: Once | OROMUCOSAL | Status: AC
  Administered 2021-09-15: 15 mL via OROMUCOSAL

## 2021-09-15 MED ORDER — SODIUM CHLORIDE 0.9 % IV SOLN: INTRAVENOUS | Status: DC | PRN

## 2021-09-15 MED ORDER — CLONIDINE HCL (ANALGESIA) 100 MCG/ML EP SOLN
EPIDURAL | Status: DC | PRN
  Administered 2021-09-15: 100 ug

## 2021-09-15 MED ORDER — 0.9 % SODIUM CHLORIDE (POUR BTL) OPTIME
TOPICAL | Status: DC | PRN
  Administered 2021-09-15: 1000 mL

## 2021-09-15 MED ORDER — ORAL CARE MOUTH RINSE: 15.0000 mL | Freq: Once | OROMUCOSAL | Status: DC

## 2021-09-15 MED ORDER — FENTANYL CITRATE (PF) 100 MCG/2ML IJ SOLN: 25.0000 ug | INTRAMUSCULAR | Status: DC | PRN

## 2021-09-15 MED ORDER — CEFAZOLIN SODIUM-DEXTROSE 2-4 GM/100ML-% IV SOLN
2.0000 g | INTRAVENOUS | Status: AC
  Administered 2021-09-15: 2 g via INTRAVENOUS
  Filled 2021-09-15: qty 100

## 2021-09-15 MED ORDER — ACETAMINOPHEN 500 MG PO TABS
1000.0000 mg | ORAL_TABLET | Freq: Once | ORAL | Status: DC
  Filled 2021-09-15: qty 2

## 2021-09-15 MED ORDER — ORAL CARE MOUTH RINSE: 15.0000 mL | Freq: Once | OROMUCOSAL | Status: AC

## 2021-09-15 MED ORDER — LACTATED RINGERS IV SOLN: INTRAVENOUS | Status: DC

## 2021-09-15 MED ORDER — MIDAZOLAM HCL 2 MG/2ML IJ SOLN
INTRAMUSCULAR | Status: AC
  Filled 2021-09-15: qty 2

## 2021-09-15 MED ORDER — TRAMADOL HCL 50 MG PO TABS: 50.0000 mg | ORAL_TABLET | Freq: Four times a day (QID) | ORAL | 0 refills | Status: DC | PRN

## 2021-09-15 MED ORDER — CHLORHEXIDINE GLUCONATE 0.12 % MT SOLN
15.0000 mL | Freq: Once | OROMUCOSAL | Status: DC
  Filled 2021-09-15: qty 15

## 2021-09-15 MED ORDER — LACTATED RINGERS IV SOLN: INTRAVENOUS | Status: DC | PRN

## 2021-09-15 MED ORDER — LIDOCAINE 2% (20 MG/ML) 5 ML SYRINGE
INTRAMUSCULAR | Status: DC | PRN
  Administered 2021-09-15: 40 mg via INTRAVENOUS

## 2021-09-15 MED ORDER — FENTANYL CITRATE (PF) 100 MCG/2ML IJ SOLN
INTRAMUSCULAR | Status: AC
  Administered 2021-09-15: 50 ug via INTRAVENOUS
  Filled 2021-09-15: qty 2

## 2021-09-15 MED ORDER — LIDOCAINE 2% (20 MG/ML) 5 ML SYRINGE
INTRAMUSCULAR | Status: AC
  Filled 2021-09-15: qty 5

## 2021-09-15 MED ORDER — FENTANYL CITRATE (PF) 100 MCG/2ML IJ SOLN: 50.0000 ug | Freq: Once | INTRAMUSCULAR | Status: AC

## 2021-09-15 MED ORDER — OXYCODONE HCL 5 MG PO TABS: 5.0000 mg | ORAL_TABLET | Freq: Once | ORAL | Status: DC | PRN

## 2021-09-15 MED ORDER — OXYCODONE HCL 5 MG/5ML PO SOLN: 5.0000 mg | Freq: Once | ORAL | Status: DC | PRN

## 2021-09-15 MED ORDER — BUPIVACAINE-EPINEPHRINE (PF) 0.5% -1:200000 IJ SOLN
INTRAMUSCULAR | Status: DC | PRN
  Administered 2021-09-15: 30 mL via PERINEURAL

## 2021-09-15 SURGICAL SUPPLY — 35 items
BAG COUNTER SPONGE SURGICOUNT (BAG) IMPLANT
BNDG COHESIVE 3X5 TAN ST LF (GAUZE/BANDAGES/DRESSINGS) ×2 IMPLANT
BNDG COHESIVE 3X5 TAN STRL LF (GAUZE/BANDAGES/DRESSINGS) ×2 IMPLANT
BNDG ESMARK 4X9 LF (GAUZE/BANDAGES/DRESSINGS) ×2 IMPLANT
BNDG GAUZE ELAST 4 BULKY (GAUZE/BANDAGES/DRESSINGS) IMPLANT
CHLORAPREP W/TINT 26 (MISCELLANEOUS) ×2 IMPLANT
CORD BIPOLAR FORCEPS 12FT (ELECTRODE) ×2 IMPLANT
COVER SURGICAL LIGHT HANDLE (MISCELLANEOUS) ×2 IMPLANT
CUFF TOURN SGL QUICK 18X4 (TOURNIQUET CUFF) ×2 IMPLANT
CUFF TOURN SGL QUICK 24 (TOURNIQUET CUFF)
CUFF TRNQT CYL 24X4X16.5-23 (TOURNIQUET CUFF) IMPLANT
DECANTER SPIKE VIAL GLASS SM (MISCELLANEOUS) IMPLANT
DRSG PAD ABDOMINAL 8X10 ST (GAUZE/BANDAGES/DRESSINGS) ×2 IMPLANT
DRSG XEROFORM 1X8 (GAUZE/BANDAGES/DRESSINGS) ×2 IMPLANT
GAUZE SPONGE 4X4 12PLY STRL (GAUZE/BANDAGES/DRESSINGS) ×2 IMPLANT
GAUZE XEROFORM 1X8 LF (GAUZE/BANDAGES/DRESSINGS) IMPLANT
GLOVE SRG 8 PF TXTR STRL LF DI (GLOVE) ×1 IMPLANT
GLOVE SURG ORTHO LTX SZ8 (GLOVE) ×2 IMPLANT
GLOVE SURG UNDER POLY LF SZ8 (GLOVE) ×2
GOWN STRL REUS W/ TWL LRG LVL3 (GOWN DISPOSABLE) ×1 IMPLANT
GOWN STRL REUS W/ TWL XL LVL3 (GOWN DISPOSABLE) ×1 IMPLANT
GOWN STRL REUS W/TWL LRG LVL3 (GOWN DISPOSABLE) ×2
GOWN STRL REUS W/TWL XL LVL3 (GOWN DISPOSABLE) ×2
KIT BASIN OR (CUSTOM PROCEDURE TRAY) ×2 IMPLANT
KIT TURNOVER KIT B (KITS) ×2 IMPLANT
NEEDLE HYPO 25GX1X1/2 BEV (NEEDLE) IMPLANT
NS IRRIG 1000ML POUR BTL (IV SOLUTION) ×2 IMPLANT
PACK ORTHO EXTREMITY (CUSTOM PROCEDURE TRAY) ×2 IMPLANT
PAD ARMBOARD 7.5X6 YLW CONV (MISCELLANEOUS) ×2 IMPLANT
PAD CAST 4YDX4 CTTN HI CHSV (CAST SUPPLIES) ×1 IMPLANT
PADDING CAST COTTON 4X4 STRL (CAST SUPPLIES) ×2
SUT ETHILON 4 0 PS 2 18 (SUTURE) ×2 IMPLANT
SYR CONTROL 10ML LL (SYRINGE) IMPLANT
TOWEL GREEN STERILE (TOWEL DISPOSABLE) ×2 IMPLANT
UNDERPAD 30X36 HEAVY ABSORB (UNDERPADS AND DIAPERS) ×2 IMPLANT

## 2021-09-15 NOTE — Anesthesia Procedure Notes (Signed)
Anesthesia Regional Block: Supraclavicular block   Pre-Anesthetic Checklist: , timeout performed,  Correct Patient, Correct Site, Correct Laterality,  Correct Procedure, Correct Position, site marked,  Risks and benefits discussed,  Pre-op evaluation,  At surgeon's request and post-op pain management  Laterality: Right  Prep: Maximum Sterile Barrier Precautions used, chloraprep       Needles:  Injection technique: Single-shot  Needle Type: Echogenic Stimulator Needle     Needle Length: 9cm  Needle Gauge: 22     Additional Needles:   Procedures:,,,, ultrasound used (permanent image in chart),,    Narrative:  Start time: 09/15/2021 12:36 PM End time: 09/15/2021 12:39 PM Injection made incrementally with aspirations every 5 mL.  Performed by: Personally  Anesthesiologist: Brennan Bailey, MD  Additional Notes: Risks, benefits, and alternative discussed. Patient gave consent for procedure. Patient prepped and draped in sterile fashion. Sedation administered, patient remains easily responsive to voice. Relevant anatomy identified with ultrasound guidance. Local anesthetic given in 5cc increments with no signs or symptoms of intravascular injection. No pain or paraesthesias with injection. Patient monitored throughout procedure with signs of LAST or immediate complications. Tolerated well. Ultrasound image placed in chart.  Tawny Asal, MD

## 2021-09-15 NOTE — Op Note (Signed)
NAME: [name] MEDICAL RECORD NO: [id number] DATE OF BIRTH: 06-22-1941 FACILITY: [facility] LOCATION: [facility] OR PHYSICIAN: Wynonia Sours, MD   OPERATIVE REPORT   DATE OF PROCEDURE: 09/15/21    PREOPERATIVE DIAGNOSIS: Carpal tunnel syndrome right hand   POSTOPERATIVE DIAGNOSIS: Same   PROCEDURE: Decompression right median   SURGEON: Daryll Brod, M.D.   ASSISTANT: none   ANESTHESIA:  Regional with sedation   INTRAVENOUS FLUIDS:  Per anesthesia flow sheet.   ESTIMATED BLOOD LOSS:  Minimal.   COMPLICATIONS:  None.   SPECIMENS:  none   TOURNIQUET TIME:    Total Tourniquet Time Documented: Upper Arm (Right) - 19 minutes Total: Upper Arm (Right) - 19 minutes    DISPOSITION:  Stable to PACU.   INDICATIONS: Patient is an 80 year old female with history of numbness tingling bilateral hands she has had nerve conductions done revealing bilateral carpal tunnel syndrome.  She has undergone release of the median nerve left side is admitted now for release of the right.  Pre-.  Postoperative course been discussed along with risks and complications.  She is aware that there is no guarantee to the surgery the possibility of infection recurrence injury to arteries nerves tendons complete relief symptoms and dystrophy.  Preoperative.  Patient is seen the extremity marked by both patient and surgeon antibiotic given.  A supraclavicular block was carried out without difficulty under the direction anesthesia department the preoperative area.  OPERATIVE COURSE: Patient is brought to the operating room placed in a supine position with the right arm free.  Prep was done with ChloraPrep.  3-minute dry time was allowed timeout taken to confirm patient procedure.  The limb was exsanguinated with an Esmarch bandage turn placed on the arm was inflated to 250 mmHg.  A longitudinal incision was made the right palm carried down through subcutaneous tissue.  Bleeders were electrocauterized with bipolar.   Palmar fascia was split.  The superficial palmar arch was identified along the flexor tendon to the ring little finger.  Retractors were placed retracting median nerve flexor tendons radially and ulnar nerve ulnarly.  The flexor retinaculum was then released on its ulnar border.  A right angle and stool retractor placed between skin and forearm fascia the fascia was then released for approximately 2 to 3 cm proximal to the wrist crease under direct vision.  The canal was explored.  Motor branch entered into muscle distally.  Tenosynovial tissue was moderately thickened.  The wound was copious irrigated with saline.  The skin was closed interrupted 4-0 nylon sutures.  A sterile compressive dressing with the fingers free was applied.  Deflation of the tourniquet all fingers immediately pink.  She was taken to the recovery room for observation in satisfactory condition.  She will be discharged home to return to the [facility] in 1 week on Ultram.   Daryll Brod, MD Electronically signed, 09/15/21

## 2021-09-15 NOTE — Anesthesia Postprocedure Evaluation (Signed)
Anesthesia Post Note  Patient: [name]  Procedure(s) Performed: RIGHT CARPAL TUNNEL RELEASE (Right)     Patient location during evaluation: PACU Anesthesia Type: Regional and MAC Level of consciousness: awake and alert and oriented Pain management: pain level controlled Vital Signs Assessment: post-procedure vital signs reviewed and stable Respiratory status: spontaneous breathing, nonlabored ventilation and respiratory function stable Cardiovascular status: blood pressure returned to baseline Postop Assessment: no apparent nausea or vomiting Anesthetic complications: no   No notable events documented.  Last Vitals:  Vitals:   09/15/21 1345 09/15/21 1400  BP: (!) 119/57 (!) 118/59  Pulse: 62 61  Resp: 16 12  Temp: 36.7 C   SpO2: 100% 100%    Last Pain:  Vitals:   09/15/21 1400  TempSrc:   PainSc: 0-No pain                 Marthenia Rolling

## 2021-09-15 NOTE — Transfer of Care (Signed)
Immediate Anesthesia Transfer of Care Note  Patient: [name]  Procedure(s) Performed: RIGHT CARPAL TUNNEL RELEASE (Right)  Patient Location: PACU  Anesthesia Type:MAC combined with regional for post-op pain  Level of Consciousness: awake, alert  and oriented  Airway & Oxygen Therapy: Patient Spontanous Breathing and Patient connected to nasal cannula oxygen  Post-op Assessment: Report given to RN and Post -op Vital signs reviewed and stable  Post vital signs: Reviewed and stable  Last Vitals:  Vitals Value Taken Time  BP 119/57 09/15/21 1344  Temp    Pulse 64 09/15/21 1345  Resp 16 09/15/21 1345  SpO2 100 % 09/15/21 1345  Vitals shown include unvalidated device data.  Last Pain:  Vitals:   09/15/21 1110  TempSrc:   PainSc: 0-No pain         Complications: No notable events documented.

## 2021-09-15 NOTE — Discharge Instructions (Addendum)

## 2021-09-15 NOTE — Brief Op Note (Signed)
09/15/2021  1:36 PM  PATIENT:  [name]  80 y.o. female  PRE-OPERATIVE DIAGNOSIS:  RIGHT CARPAL TUNNEL SYNDROME  POST-OPERATIVE DIAGNOSIS:  RIGHT CARPAL TUNNEL SYNDROME  PROCEDURE:  Procedure(s): RIGHT CARPAL TUNNEL RELEASE (Right)  SURGEON:  Surgeon(s) and Role:    * Daryll Brod, MD - Primary  PHYSICIAN ASSISTANT:   ASSISTANTS: none   ANESTHESIA:   regional and IV sedation  EBL: 62ml   BLOOD ADMINISTERED:none  DRAINS: none   LOCAL MEDICATIONS USED:  NONE  SPECIMEN:  No Specimen  DISPOSITION OF SPECIMEN:  N/A  COUNTS:  YES  TOURNIQUET:   Total Tourniquet Time Documented: Upper Arm (Right) - 19 minutes Total: Upper Arm (Right) - 19 minutes   DICTATION: .Viviann Spare Dictation  PLAN OF CARE: Discharge to home after PACU  PATIENT DISPOSITION:  PACU - hemodynamically stable.

## 2021-09-15 NOTE — H&P (Signed)
[name] is an 80 y.o. female.   Chief Complaint: numbness right hand HPI: Leolais an 80 year old right-hand-dominant female complaining of bilateral numbness and tingling left worse than right. She is referred by Dr. Lavell Anchors for carpal tunnel syndrome. Left is worse than right. This been going on for approximately 5 months. She calls no history of injury to the hand or to the neck. She is waking 7 out of 7 nights. She has worn splints and taken Tylenol for she also has a mass on her left thumb at the metacarpal phalangeal joint. She has contracture of her right middle finger PIP joint which has been present for period of time. She states nothing makes the numbness better or worse it is all fingers involved. It is not constant at the present time. She has a burning pain with it. She has a history of diabetes arthritis no history of thyroid problems or gout. Family history is positive diabetes. She has had nerve conductions done revealing a motor delay 7.5 on the right and 11.4 on the left with no sensory response bilateral She is now 6 months following surgery. She is doing very well on her left side. She continues complain and numbness and tingling on the right side. This awakens her at night.  Past Medical History:  Diagnosis Date   ACC/AHA stage C congestive heart failure due to ischemic cardiomyopathy ([place]) 01/31/2009   Qualifier: Diagnosis of  By: Olevia Perches, MD, Glenetta Hew    Acute cystitis with hematuria 09/01/2020   ANXIETY 01/31/2009   Qualifier: Diagnosis of  By: Lovette Cliche, CNA, Christy     Arthritis    "qwhere" (03/30/2016)   Benign essential HTN    Bilateral carpal tunnel syndrome 02/13/2021   CAD (coronary artery disease) 05/2008   a. s/p CABG in 2009.   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 Postoperative in 2009;  S/P transesophageal echocardiography-guided cardioversion, previously on Amiodarone and Coumadin. b. recurrent in April 2013 -> pt elected rate control strategy, initially Coumadin -> had stroke with subtherapeutic INR in 03/2016 and transitioned to Eliquis.   Macular degeneration    Melena 01/10/2021   Morbid obesity ([place]) 03/26/2017   Morbid obesity ([place]) 03/26/2017   OSA on CPAP    severe OSA with AHI 34/hr   Persistent atrial fibrillation (HCC)    Proliferative diabetic retinopathy of right eye ([place]) 04/17/2021   Found today at delivery of PRP for what was thought to be severe NPDR OD   Rectal bleeding  Thrombocytopenia ([place])     Past Surgical History:  Procedure Laterality Date   APPENDECTOMY  1946   BACK SURGERY     CARPAL TUNNEL RELEASE Left 03/28/2021   Procedure: LEFT CARPAL TUNNEL RELEASE;  Surgeon: Daryll Brod, MD;  Location: [facility];  Service: Orthopedics;  Laterality: Left;  60 MIN   CATARACT EXTRACTION Right 2012   COLONOSCOPY WITH PROPOFOL Left 03/27/2017   Procedure: COLONOSCOPY WITH PROPOFOL;  Surgeon: Ronnette Juniper, MD;  Location: [facility];  Service: Gastroenterology;  Laterality: Left;   CORONARY ANGIOPLASTY WITH STENT PLACEMENT  2009   "put 2 stents in"    EXCISION METACARPAL MASS Left 03/28/2021   Procedure: EXCISION MASS LEFT THUMB;  Surgeon: Daryll Brod, MD;  Location: [facility];  Service: Orthopedics;  Laterality: Left;   EYE SURGERY     bilateral cataract   HERNIA REPAIR     IR LUMBAR DISC ASPIRATION W/IMG GUIDE  10/08/2018   JOINT REPLACEMENT     TEE WITH CARDIOVERSION     Postoperative in 2009;  S/P transesophageal echocardiography-guided cardioversion,   TOTAL KNEE ARTHROPLASTY Right 1994   VENTRAL HERNIA REPAIR  10/1999   With mesh    Family History  Problem Relation Age of Onset   Clotting disorder Mother        Cerebral hemorrhage   Other Mother        cerebral hemorrhage   Emphysema Father        COD   Lung cancer Brother    Heart disease Neg Hx    Social History:  reports that she has never smoked. She has never used smokeless tobacco. She reports that she does not drink alcohol and does not use drugs.  Allergies:  Allergies  Allergen Reactions   Benazepril Other (See Comments)    Unknown reaction at age 57-65 - possibly dizziness   Ozempic (0.25 Or 0.5 Mg-Dose) [Semaglutide(0.25 Or 0.5mg -Dos)] Diarrhea    Gi intollerance   Tape     TOLERATES PAPER TAPE ONLY- due to thin skin. NOT ALLERGY PER PT   Angiotensin Receptor Blockers Other (See Comments)    Hypotension reaction   Fe-Succ-C-Thre-B12-Des Stomach Other (See Comments)    Unknown reaction   Sulfamethoxazole-Trimethoprim Other (See Comments)    Unknown reaction    No medications prior to admission.    No results found for this or any previous visit (from the past 48 hour(s)).  No results found.   Pertinent items are noted in HPI.  There were no vitals taken for this visit.  General appearance: alert, cooperative, and appears stated age Head: Normocephalic, without obvious abnormality Neck: no JVD Resp: clear to auscultation bilaterally Cardio: regular rate and rhythm, S1, S2 normal, no murmur, click, rub or gallop GI: soft, non-tender; bowel  sounds normal; no masses,  no organomegaly Extremities:  numbness right hand Pulses: 2+ and symmetric Skin: Skin color, texture, turgor normal. No rashes or lesions Neurologic: Grossly normal Incision/Wound: na  Assessment/Plan Diagnosis status post carpal tunnel decompression left with carpal tunnel right  She would like to proceed with surgical intervention on her right side. Prepare postoperative course are discussed along with risk and complications. She is aware that there is no guarantee to the surgery the possibility of infection recurrence injury to arteries nerves tendons incomplete relief symptoms dystrophy. She would like to proceed and is scheduled for a carpal tunnel release in outpatient under regional anesthesia.  Daryll Brod 09/15/2021, 4:42 AM

## 2021-09-16 ENCOUNTER — Encounter (HOSPITAL_COMMUNITY): Payer: Self-pay | Admitting: Orthopedic Surgery

## 2021-09-18 DIAGNOSIS — L89152 Pressure ulcer of sacral region, stage 2: Secondary | ICD-10-CM | POA: Diagnosis not present

## 2021-09-19 DIAGNOSIS — E1122 Type 2 diabetes mellitus with diabetic chronic kidney disease: Secondary | ICD-10-CM | POA: Diagnosis not present

## 2021-09-19 DIAGNOSIS — I4811 Longstanding persistent atrial fibrillation: Secondary | ICD-10-CM | POA: Diagnosis not present

## 2021-09-19 DIAGNOSIS — I5022 Chronic systolic (congestive) heart failure: Secondary | ICD-10-CM | POA: Diagnosis not present

## 2021-09-19 DIAGNOSIS — E1142 Type 2 diabetes mellitus with diabetic polyneuropathy: Secondary | ICD-10-CM | POA: Diagnosis not present

## 2021-09-19 DIAGNOSIS — D509 Iron deficiency anemia, unspecified: Secondary | ICD-10-CM | POA: Diagnosis not present

## 2021-09-19 DIAGNOSIS — L89152 Pressure ulcer of sacral region, stage 2: Secondary | ICD-10-CM | POA: Diagnosis not present

## 2021-09-19 DIAGNOSIS — N183 Chronic kidney disease, stage 3 unspecified: Secondary | ICD-10-CM | POA: Diagnosis not present

## 2021-09-19 DIAGNOSIS — I872 Venous insufficiency (chronic) (peripheral): Secondary | ICD-10-CM | POA: Diagnosis not present

## 2021-09-19 DIAGNOSIS — I130 Hypertensive heart and chronic kidney disease with heart failure and stage 1 through stage 4 chronic kidney disease, or unspecified chronic kidney disease: Secondary | ICD-10-CM | POA: Diagnosis not present

## 2021-09-19 DIAGNOSIS — I2510 Atherosclerotic heart disease of native coronary artery without angina pectoris: Secondary | ICD-10-CM | POA: Diagnosis not present

## 2021-09-21 ENCOUNTER — Encounter (INDEPENDENT_AMBULATORY_CARE_PROVIDER_SITE_OTHER): Payer: Medicare HMO | Admitting: Ophthalmology

## 2021-09-27 ENCOUNTER — Telehealth (HOSPITAL_COMMUNITY): Payer: Self-pay | Admitting: *Deleted

## 2021-09-27 DIAGNOSIS — E1142 Type 2 diabetes mellitus with diabetic polyneuropathy: Secondary | ICD-10-CM | POA: Diagnosis not present

## 2021-09-27 DIAGNOSIS — E1122 Type 2 diabetes mellitus with diabetic chronic kidney disease: Secondary | ICD-10-CM | POA: Diagnosis not present

## 2021-09-27 DIAGNOSIS — I5022 Chronic systolic (congestive) heart failure: Secondary | ICD-10-CM | POA: Diagnosis not present

## 2021-09-27 DIAGNOSIS — D509 Iron deficiency anemia, unspecified: Secondary | ICD-10-CM | POA: Diagnosis not present

## 2021-09-27 DIAGNOSIS — I4811 Longstanding persistent atrial fibrillation: Secondary | ICD-10-CM | POA: Diagnosis not present

## 2021-09-27 DIAGNOSIS — I872 Venous insufficiency (chronic) (peripheral): Secondary | ICD-10-CM | POA: Diagnosis not present

## 2021-09-27 DIAGNOSIS — L89152 Pressure ulcer of sacral region, stage 2: Secondary | ICD-10-CM | POA: Diagnosis not present

## 2021-09-27 DIAGNOSIS — I130 Hypertensive heart and chronic kidney disease with heart failure and stage 1 through stage 4 chronic kidney disease, or unspecified chronic kidney disease: Secondary | ICD-10-CM | POA: Diagnosis not present

## 2021-09-27 DIAGNOSIS — N183 Chronic kidney disease, stage 3 unspecified: Secondary | ICD-10-CM | POA: Diagnosis not present

## 2021-09-27 DIAGNOSIS — I2510 Atherosclerotic heart disease of native coronary artery without angina pectoris: Secondary | ICD-10-CM | POA: Diagnosis not present

## 2021-09-27 NOTE — Telephone Encounter (Signed)
No antibiotics needed.  Need the dentist to tell us if they want to stop the blood thinner or not.  If so it is ok for 2 days prior/day of.

## 2021-09-27 NOTE — Telephone Encounter (Signed)
Barbara with patients dental office left vm asking if pt needs pre meds before dental procedures. She also asked if pt needs to hold blood thinner and if so for how long.  Routed to [place] for advice  Call back #[phone]

## 2021-09-27 NOTE — Telephone Encounter (Signed)
Spoke with Pamala Hurry she is aware and thanked me for the call.

## 2021-09-28 ENCOUNTER — Encounter (INDEPENDENT_AMBULATORY_CARE_PROVIDER_SITE_OTHER): Payer: Self-pay | Admitting: Ophthalmology

## 2021-09-28 ENCOUNTER — Encounter (INDEPENDENT_AMBULATORY_CARE_PROVIDER_SITE_OTHER): Payer: Medicare HMO | Admitting: Ophthalmology

## 2021-09-28 ENCOUNTER — Other Ambulatory Visit: Payer: Self-pay

## 2021-09-28 ENCOUNTER — Ambulatory Visit (INDEPENDENT_AMBULATORY_CARE_PROVIDER_SITE_OTHER): Payer: Medicare HMO | Admitting: Ophthalmology

## 2021-09-28 DIAGNOSIS — E113492 Type 2 diabetes mellitus with severe nonproliferative diabetic retinopathy without macular edema, left eye: Secondary | ICD-10-CM | POA: Diagnosis not present

## 2021-09-28 DIAGNOSIS — H353222 Exudative age-related macular degeneration, left eye, with inactive choroidal neovascularization: Secondary | ICD-10-CM

## 2021-09-28 DIAGNOSIS — E113491 Type 2 diabetes mellitus with severe nonproliferative diabetic retinopathy without macular edema, right eye: Secondary | ICD-10-CM

## 2021-09-28 DIAGNOSIS — H353113 Nonexudative age-related macular degeneration, right eye, advanced atrophic without subfoveal involvement: Secondary | ICD-10-CM

## 2021-09-28 NOTE — Assessment & Plan Note (Addendum)
>>  ASSESSMENT AND PLAN FOR SEVERE NONPROLIFERATIVE DIABETIC RETINOPATHY OF BOTH EYES (HCC) WRITTEN ON 09/28/2021  3:34 PM BY Fawn Kirk A, MD  OD now controlled, no sign of progression of PDR with good moderate scatter PRP peripherally applied  >>ASSESSMENT AND PLAN FOR SEVERE NONPROLIFERATIVE DIABETIC RETINOPATHY OF LEFT EYE (HCC) WRITTEN ON 09/28/2021  3:34 PM BY Corrado Hymon A, MD  OS, stable, good PRP decrease vegF burden

## 2021-09-28 NOTE — Assessment & Plan Note (Signed)
Parents of CNVM OD

## 2021-09-28 NOTE — Progress Notes (Signed)
09/28/2021     CHIEF COMPLAINT Patient presents for  Chief Complaint  Patient presents with   Retina Follow Up      HISTORY OF PRESENT ILLNESS: [name] is a 80 y.o. female who presents to the clinic today for:   HPI     Retina Follow Up   Patient presents with  Diabetic Retinopathy.  In both eyes.  This started 3 months ago.  Duration of 3 months.  Since onset it is stable.        Comments   3 month f/u OU with OCT and FP      Last edited by Frederik Pear, COA on 09/28/2021  3:02 PM.      Referring physician: Maury Dus, MD [address] [place],  [place] [address]  HISTORICAL INFORMATION:   Selected notes from the MEDICAL RECORD NUMBER    Lab Results  Component Value Date   HGBA1C 7.6 (H) 09/07/2021     CURRENT MEDICATIONS: No current outpatient medications on file. (Ophthalmic Drugs)   No current facility-administered medications for this visit. (Ophthalmic Drugs)   Current Outpatient Medications (Other)  Medication Sig   acetaminophen (TYLENOL 8 HOUR) 650 MG CR tablet Take 1 tablet (650 mg total) by mouth every 8 (eight) hours as needed for pain.   apixaban (ELIQUIS) 5 MG TABS tablet Take 1 tablet (5 mg total) by mouth 2 (two) times daily.   atorvastatin (LIPITOR) 40 MG tablet Take 1 tablet (40 mg total) by mouth daily.   Blood Glucose Monitoring Suppl (ONE TOUCH ULTRA 2) w/Device KIT USE TO CHECK BLOOD SUGAR UPTO 3 TIMES A DAY   carvedilol (COREG) 6.25 MG tablet TAKE 1 TABLET TWICE A DAY  WITH MEALS   cholecalciferol (VITAMIN D3) 25 MCG (1000 UNIT) tablet Take 1,000 Units by mouth daily.   docusate sodium (COLACE) 100 MG capsule Take 100 mg by mouth daily as needed for moderate constipation.   DULoxetine (CYMBALTA) 20 MG capsule TAKE 1 CAPSULE DAILY   empagliflozin (JARDIANCE) 10 MG TABS tablet Take 1 tablet (10 mg total) by mouth daily before breakfast.   ferrous sulfate 325 (65 FE) MG tablet TAKE 1 TABLET BY MOUTH EVERY DAY WITH  BREAKFAST   glucose blood (ONE TOUCH ULTRA TEST) test strip Use three times a day   Lancets (ONETOUCH DELICA PLUS LANCET33G) MISC 1 application by Does not apply route daily. Please use to check blood sugar up to three times daily.   lidocaine (XYLOCAINE) 5 % ointment Apply 1 application topically as needed.   metolazone (ZAROXOLYN) 2.5 MG tablet Take only as directed by CHF clinic (Patient taking differently: Take 2.5 mg by mouth daily as needed (Take only as directed by CHF clinic).)   nitroGLYCERIN (NITROSTAT) 0.4 MG SL tablet PLACE 1 TABLET (0.4 MG TOTAL) UNDER THE TONGUE EVERY 5 (FIVE) MINUTES AS NEEDED FOR CHEST PAIN (UP TO 3 DOSES).   potassium chloride SA (KLOR-CON) 20 MEQ tablet Take 3 tablets (60 mEq total) by mouth daily.   rOPINIRole (REQUIP) 0.5 MG tablet TAKE 2 TABLETS AT BEDTIME (Patient taking differently: Take 0.5 mg by mouth at bedtime.)   Skin Protectants, Misc. (WHITE PETROLATUM-ZINC) 58.3 % cream Apply topically 3 (three) times daily as needed. (Patient not taking: No sig reported)   spironolactone (ALDACTONE) 25 MG tablet Take 0.5 tablets (12.5 mg total) by mouth daily.   torsemide (DEMADEX) 20 MG tablet Take 3 tablets (60 mg total) by mouth 2 (two) times  daily.   traMADol (ULTRAM) 50 MG tablet Take 1 tablet (50 mg total) by mouth every 6 (six) hours as needed.   vitamin B-12 (CYANOCOBALAMIN) 1000 MCG tablet Take 1 tablet (1,000 mcg total) by mouth daily.   No current facility-administered medications for this visit. (Other)      REVIEW OF SYSTEMS:    ALLERGIES Allergies  Allergen Reactions   Benazepril Other (See Comments)    Unknown reaction at age 81-65 - possibly dizziness   Ozempic (0.25 Or 0.5 Mg-Dose) [Semaglutide(0.25 Or 0.5mg -Dos)] Diarrhea    Gi intollerance   Tape     TOLERATES PAPER TAPE ONLY- due to thin skin. NOT ALLERGY PER PT   Angiotensin Receptor Blockers Other (See Comments)    Hypotension reaction   Fe-Succ-C-Thre-B12-Des Stomach Other (See  Comments)    Unknown reaction   Sulfamethoxazole-Trimethoprim Other (See Comments)    Unknown reaction    PAST MEDICAL HISTORY Past Medical History:  Diagnosis Date   Acute cystitis with hematuria 09/01/2020   ANXIETY 01/31/2009   Qualifier: Diagnosis of  By: Lovette Cliche, CNA, Christy     Arthritis    "qwhere" (03/30/2016)   Bilateral carpal tunnel syndrome 02/13/2021   CAD (coronary artery disease) 05/2008   a. s/p CABG in 2009.   Cerebrovascular accident (CVA) due to embolism of right middle cerebral artery (HCC)    CHF (congestive heart failure) (HCC)    Chronic anticoagulation    Chronic kidney disease (CKD), stage III (moderate) (HCC)    Chronic pain syndrome    Diarrhea 01/10/2021   DM type 2 with diabetic peripheral neuropathy ([place])    Dysuria 73/41/9379   Enteric duplication cyst 02/40/9735   Facial weakness, post-stroke    Gabapentin adverse reaction 09/01/2020   Gastrointestinal hemorrhage 03/26/2017   GERD (gastroesophageal reflux disease)    Hand tingling 06/02/2020   Hemiparesis ([place])    History of CVA with residual deficit 03/26/2017   Hyperlipidemia    Hypertension    Hypokalemia 11/20/2017   Intertrigo 08/05/2018   Ischemic cardiomyopathy    Longstanding persistent atrial fibrillation ([facility])    a. Postoperative in 2009;  S/P transesophageal echocardiography-guided cardioversion, previously on Amiodarone and Coumadin. b. recurrent in April 2013 -> pt elected rate control strategy, initially Coumadin -> had stroke with subtherapeutic INR in 03/2016 and transitioned to Eliquis.   Macular degeneration    Melena 01/10/2021   Morbid obesity ([place]) 03/26/2017   OSA on CPAP    severe OSA with AHI 34/hr   Proliferative diabetic retinopathy of right eye ([place]) 04/17/2021   Found today at delivery of PRP for what was thought to be severe NPDR OD   Rectal bleeding    Thrombocytopenia ([place])    Past Surgical History:  Procedure Laterality Date   APPENDECTOMY  1946   BACK  SURGERY     CARPAL TUNNEL RELEASE Left 03/28/2021   Procedure: LEFT CARPAL TUNNEL RELEASE;  Surgeon: Daryll Brod, MD;  Location: [facility];  Service: Orthopedics;  Laterality: Left;  60 MIN   CARPAL TUNNEL RELEASE Right 09/15/2021   Procedure: RIGHT CARPAL TUNNEL RELEASE;  Surgeon: Daryll Brod, MD;  Location: [facility];  Service: Orthopedics;  Laterality: Right;   CATARACT EXTRACTION Right 2012   COLONOSCOPY WITH PROPOFOL Left 03/27/2017   Procedure: COLONOSCOPY WITH PROPOFOL;  Surgeon: Ronnette Juniper, MD;  Location: [facility];  Service: Gastroenterology;  Laterality: Left;   CORONARY ANGIOPLASTY WITH STENT PLACEMENT  2009   "put 2 stents in"   EXCISION  METACARPAL MASS Left 03/28/2021   Procedure: EXCISION MASS LEFT THUMB;  Surgeon: Cindee Salt, MD;  Location: [facility];  Service: Orthopedics;  Laterality: Left;   EYE SURGERY     bilateral cataract   HERNIA REPAIR     IR LUMBAR DISC ASPIRATION W/IMG GUIDE  10/08/2018   JOINT REPLACEMENT     TEE WITH CARDIOVERSION     Postoperative in 2009;  S/P transesophageal echocardiography-guided cardioversion,   TOTAL KNEE ARTHROPLASTY Right 1994   VENTRAL HERNIA REPAIR  10/1999   With mesh    FAMILY HISTORY Family History  Problem Relation Age of Onset   Clotting disorder Mother        Cerebral hemorrhage   Other Mother        cerebral hemorrhage   Emphysema Father        COD   Lung cancer Brother    Heart disease Neg Hx     SOCIAL HISTORY Social History   Tobacco Use   Smoking status: Never   Smokeless tobacco: Never  Vaping Use   Vaping Use: Never used  Substance Use Topics   Alcohol use: No   Drug use: Never         OPHTHALMIC EXAM:  Base Eye Exam     Visual Acuity (ETDRS)       Right Left   Dist cc 20/50 -1 20/32 -2   Dist ph cc NI 20/25 -2    Correction: Glasses         Tonometry (Tonopen, 3:08 PM)       Right Left   Pressure 11 9         Pupils       Pupils Dark Light Shape React APD   Right PERRL 4 3 Round  Brisk None   Left PERRL 4 3 Round Brisk None         Visual Fields (Counting fingers)       Left Right    Full Full         Extraocular Movement       Right Left    Full, Ortho Full, Ortho         Neuro/Psych     Oriented x3: Yes   Mood/Affect: Normal         Dilation     Both eyes: 1.0% Mydriacyl, 2.5% Phenylephrine @ 3:08 PM           Slit Lamp and Fundus Exam     External Exam       Right Left   External Normal Normal         Slit Lamp Exam       Right Left   Lids/Lashes Normal Normal   Conjunctiva/Sclera White and quiet White and quiet   Cornea Clear Clear   Anterior Chamber Deep and quiet Deep and quiet   Iris Round and reactive Round and reactive   Lens Posterior chamber intraocular lens Posterior chamber intraocular lens   Anterior Vitreous Normal Normal         Fundus Exam       Right Left   Posterior Vitreous Posterior vitreous detachment small vitreous hemorrhage superiorly, Vitreous hemorrhage Partial posterior vitreous detachment   Disc Normal Normal   C/D Ratio 0.7 0.7   Macula Soft drusen, Retinal pigment epithelial mottling, no macular thickening, geographic atrophy superior portion of the macula Intermediate age related macular degeneration, Hard drusen, Pigmented atrophy   Vessels NPDR-Severe, with arm inferiorly NPDR-Severe  Periphery Moderate scatter PRP Moderate scatter PRP            IMAGING AND PROCEDURES  Imaging and Procedures for 09/28/21  OCT, Retina - OU - Both Eyes       Right Eye Quality was good. Scan locations included subfoveal. Central Foveal Thickness: 215. Progression has been stable. Findings include abnormal foveal contour.   Left Eye Quality was good. Scan locations included subfoveal. Central Foveal Thickness: 295. Progression has been stable. Findings include abnormal foveal contour, vitreous traction, vitreomacular adhesion .   Notes  OD with no active CSME last injection Avastin  some 7 to 8 months previous. Bilateral severe NPDR treatment of peripheral PRP may have decrease vegF production.  Bilateral PDR        Color Fundus Photography Optos - OU - Both Eyes       Right Eye Progression has been stable. Disc findings include normal observations. Macula : microaneurysms, geographic atrophy, drusen.   Left Eye Progression has been stable. Disc findings include normal observations. Macula : microaneurysms, drusen.   Notes Good PRP OD nasally clear media.  Areas of extensive retinal nonperfusion temporally and superiorly PRP for severe NPDR  OS with very severe NPDR clear media             ASSESSMENT/PLAN:  Severe nonproliferative diabetic retinopathy of right eye (HCC) OD now controlled, no sign of progression of PDR with good moderate scatter PRP peripherally applied  Severe nonproliferative diabetic retinopathy of left eye (HCC) OS, stable, good PRP decrease vegF burden  Exudative age-related macular degeneration of left eye with inactive choroidal neovascularization (HCC) Parents of CNVM OD  Advanced nonexudative age-related macular degeneration of right eye without subfoveal involvement Foveal perifoveal atrophy accounts for some element of acuity OD stable     ICD-10-CM   1. Severe nonproliferative diabetic retinopathy of left eye without macular edema associated with type 2 diabetes mellitus (HCC)  S01.0932 OCT, Retina - OU - Both Eyes    Color Fundus Photography Optos - OU - Both Eyes    2. Severe nonproliferative diabetic retinopathy of right eye without macular edema associated with type 2 diabetes mellitus ([place])  E11.3491     3. Exudative age-related macular degeneration of left eye with inactive choroidal neovascularization ([place])  H35.3222     4. Advanced nonexudative age-related macular degeneration of right eye without subfoveal involvement  H35.3113       1.  OU with severe NPDR, no progression over time, good PRP will  prevent progression of PDR likely  2.  3.  Ophthalmic Meds Ordered this visit:  No orders of the defined types were placed in this encounter.      Return in about 4 months (around 01/26/2022) for DILATE OU, COLOR FP, OCT.  There are no Patient Instructions on file for this visit.   Explained the diagnoses, plan, and follow up with the patient and they expressed understanding.  Patient expressed understanding of the importance of proper follow up care.   Clent Demark Dream Harman M.D. Diseases & Surgery of the Retina and Vitreous [facility]ina [facility] Diabetic [place] 09/28/21     Abbreviations: M myopia (nearsighted); A astigmatism; H hyperopia (farsighted); P presbyopia; Mrx spectacle prescription;  CTL contact lenses; OD right eye; OS left eye; OU both eyes  XT exotropia; ET esotropia; PEK punctate epithelial keratitis; PEE punctate epithelial erosions; DES dry eye syndrome; MGD meibomian gland dysfunction; ATs artificial tears; PFAT's preservative free artificial tears; [place] nuclear sclerotic cataract;  PSC posterior subcapsular cataract; ERM epi-retinal membrane; PVD posterior vitreous detachment; RD retinal detachment; DM diabetes mellitus; DR diabetic retinopathy; NPDR non-proliferative diabetic retinopathy; PDR proliferative diabetic retinopathy; CSME clinically significant macular edema; DME diabetic macular edema; dbh dot blot hemorrhages; CWS cotton wool spot; POAG primary open angle glaucoma; C/D cup-to-disc ratio; HVF humphrey visual field; GVF goldmann visual field; OCT optical coherence tomography; IOP intraocular pressure; BRVO Branch retinal vein occlusion; CRVO central retinal vein occlusion; CRAO central retinal artery occlusion; BRAO branch retinal artery occlusion; RT retinal tear; SB scleral buckle; PPV pars plana vitrectomy; VH Vitreous hemorrhage; PRP panretinal laser photocoagulation; IVK intravitreal kenalog; VMT vitreomacular traction; MH Macular hole;  NVD neovascularization of the  disc; NVE neovascularization elsewhere; AREDS age related eye disease study; ARMD age related macular degeneration; POAG primary open angle glaucoma; EBMD epithelial/anterior basement membrane dystrophy; ACIOL anterior chamber intraocular lens; IOL intraocular lens; PCIOL posterior chamber intraocular lens; Phaco/IOL phacoemulsification with intraocular lens placement; [place] photorefractive keratectomy; LASIK laser assisted in situ keratomileusis; HTN hypertension; DM diabetes mellitus; COPD chronic obstructive pulmonary disease

## 2021-09-28 NOTE — Assessment & Plan Note (Signed)
OS, stable, good PRP decrease vegF burden

## 2021-09-28 NOTE — Assessment & Plan Note (Signed)
Foveal perifoveal atrophy accounts for some element of acuity OD stable

## 2021-10-02 DIAGNOSIS — H43811 Vitreous degeneration, right eye: Secondary | ICD-10-CM | POA: Diagnosis not present

## 2021-10-02 DIAGNOSIS — H353132 Nonexudative age-related macular degeneration, bilateral, intermediate dry stage: Secondary | ICD-10-CM | POA: Diagnosis not present

## 2021-10-02 DIAGNOSIS — Z961 Presence of intraocular lens: Secondary | ICD-10-CM | POA: Diagnosis not present

## 2021-10-02 DIAGNOSIS — E113293 Type 2 diabetes mellitus with mild nonproliferative diabetic retinopathy without macular edema, bilateral: Secondary | ICD-10-CM | POA: Diagnosis not present

## 2021-10-02 DIAGNOSIS — H26493 Other secondary cataract, bilateral: Secondary | ICD-10-CM | POA: Diagnosis not present

## 2021-10-02 DIAGNOSIS — H17821 Peripheral opacity of cornea, right eye: Secondary | ICD-10-CM | POA: Diagnosis not present

## 2021-10-10 ENCOUNTER — Other Ambulatory Visit: Payer: Self-pay

## 2021-10-10 ENCOUNTER — Encounter: Payer: Self-pay | Admitting: Family Medicine

## 2021-10-10 ENCOUNTER — Ambulatory Visit (INDEPENDENT_AMBULATORY_CARE_PROVIDER_SITE_OTHER): Payer: Medicare HMO | Admitting: Family Medicine

## 2021-10-10 VITALS — BP 136/85 | HR 73 | Ht 69.0 in | Wt 203.2 lb

## 2021-10-10 DIAGNOSIS — R350 Frequency of micturition: Secondary | ICD-10-CM | POA: Diagnosis not present

## 2021-10-10 DIAGNOSIS — L89152 Pressure ulcer of sacral region, stage 2: Secondary | ICD-10-CM | POA: Diagnosis not present

## 2021-10-10 LAB — POCT UA - MICROSCOPIC ONLY

## 2021-10-10 LAB — POCT URINALYSIS DIP (MANUAL ENTRY)
Bilirubin, UA: NEGATIVE
Glucose, UA: 500 mg/dL — AB
Ketones, POC UA: NEGATIVE mg/dL
Leukocytes, UA: NEGATIVE
Nitrite, UA: NEGATIVE
Protein Ur, POC: NEGATIVE mg/dL
Spec Grav, UA: 1.01 (ref 1.010–1.025)
Urobilinogen, UA: 0.2 E.U./dL
pH, UA: 6 (ref 5.0–8.0)

## 2021-10-10 NOTE — Progress Notes (Signed)
SUBJECTIVE:   CHIEF COMPLAINT / HPI:   Sacral Wound Patient here to follow-up on her sacral wound. Present since December 2019 after she was hospitalized for a stroke. Healed fully but has seemed to recur over past few months. She is frustrated with her home health nurse because at the first visit she put something on the area and it seemed to make it worse. Since then she just looks at the area, says it looks good and doesn't do anything. Patient is still having burning pain in the area of the wound. Using lidocaine gel with some relief. Used a special zinc cream (she doesn't know the exact name) in the past which she found very helpful but does not have any left. Would like the area re-checked today.  Urinary Frequency Patient with ongoing urinary frequency. This is a chronic problem for her. No dysuria, fevers, suprapubic pain, or other symptoms. However, she has had several UTIs in the past and would like her urine checked again today.  PERTINENT  PMH / PSH:  Past Medical History:  Diagnosis Date   Acute cystitis with hematuria 09/01/2020   ANXIETY 01/31/2009   Qualifier: Diagnosis of  By: Lovette Cliche, CNA, Christy     Arthritis    "qwhere" (03/30/2016)   Bilateral carpal tunnel syndrome 02/13/2021   CAD (coronary artery disease) 05/2008   a. s/p CABG in 2009.   Cerebrovascular accident (CVA) due to embolism of right middle cerebral artery (HCC)    CHF (congestive heart failure) (HCC)    Chronic anticoagulation    Chronic kidney disease (CKD), stage III (moderate) (HCC)    Chronic pain syndrome    Diarrhea 01/10/2021   DM type 2 with diabetic peripheral neuropathy ([place])    Dysuria 05/11/1600   Enteric duplication cyst 09/32/3557   Facial weakness, post-stroke    Gabapentin adverse reaction 09/01/2020   Gastrointestinal hemorrhage 03/26/2017   GERD (gastroesophageal reflux disease)    Hand tingling 06/02/2020   Hemiparesis ([place])    History of CVA with residual deficit  03/26/2017   Hyperlipidemia    Hypertension    Hypokalemia 11/20/2017   Intertrigo 08/05/2018   Ischemic cardiomyopathy    Longstanding persistent atrial fibrillation ([place])    a. Postoperative in 2009;  S/P transesophageal echocardiography-guided cardioversion, previously on Amiodarone and Coumadin. b. recurrent in April 2013 -> pt elected rate control strategy, initially Coumadin -> had stroke with subtherapeutic INR in 03/2016 and transitioned to Eliquis.   Macular degeneration    Melena 01/10/2021   Morbid obesity ([place]) 03/26/2017   OSA on CPAP    severe OSA with AHI 34/hr   Proliferative diabetic retinopathy of right eye ([place]) 04/17/2021   Found today at delivery of PRP for what was thought to be severe NPDR OD   Rectal bleeding    Thrombocytopenia (HCC)     OBJECTIVE:   BP 136/85   Pulse 73   Ht 5\' 9"  (1.753 m)   Wt 203 lb 4 oz (92.2 kg)   LMP  (LMP Unknown)   SpO2 100%   BMI 30.01 kg/m   General: NAD, pleasant, able to participate in exam Respiratory: No respiratory distress Skin: warm and dry Coccyx:  Psych: Normal affect and mood Neuro: grossly intact   ASSESSMENT/PLAN:   Decubitus ulcer of coccyx, stage 2 ([place]) History of stage 2 decubitus ulcer in December 2019 after CVA. It healed fully but patient now with several weeks of burning pain in the area.  Appears very  well-healed on exam today without any significant skin breakdown. No signs of infection. -Reassurance provided -Continue lidocaine gel as needed for pain -Continue zinc barrier cream. Patient will call with the exact name so I can send refills -Discussed importance of frequent position changes to avoid new pressure injuries (advised donut pillow as well). -Offered trial of capsaicin cream as an adjunct, as pain seems to be neuropathic in nature. Patient declined.   Urinary Frequency Chronic. UA without signs of infection today. However, 5-10 RBCs noted on micro, so will plan to repeat UA in the  future to ensure no persistent microscopic hematuria.  Alcus Dad, MD [place]

## 2021-10-10 NOTE — Patient Instructions (Signed)
It was great to see you!  Things we discussed today: -The pressure ulcer on your buttocks is well-healed. There are no signs of infection or other problems. -Continue using the lidocaine as needed for pain relief. I have sent refills to your pharmacy. -Call with the exact name of the zinc cream and I will refill that as well. -Keep using the donut pillow and trying to change position as often as possible.  Take care and seek immediate care sooner if you develop any concerns.  Dr. [facility]

## 2021-10-11 ENCOUNTER — Telehealth: Payer: Self-pay

## 2021-10-11 DIAGNOSIS — I2510 Atherosclerotic heart disease of native coronary artery without angina pectoris: Secondary | ICD-10-CM | POA: Diagnosis not present

## 2021-10-11 DIAGNOSIS — L89152 Pressure ulcer of sacral region, stage 2: Secondary | ICD-10-CM | POA: Diagnosis not present

## 2021-10-11 DIAGNOSIS — I130 Hypertensive heart and chronic kidney disease with heart failure and stage 1 through stage 4 chronic kidney disease, or unspecified chronic kidney disease: Secondary | ICD-10-CM | POA: Diagnosis not present

## 2021-10-11 DIAGNOSIS — E1142 Type 2 diabetes mellitus with diabetic polyneuropathy: Secondary | ICD-10-CM | POA: Diagnosis not present

## 2021-10-11 DIAGNOSIS — E1122 Type 2 diabetes mellitus with diabetic chronic kidney disease: Secondary | ICD-10-CM | POA: Diagnosis not present

## 2021-10-11 DIAGNOSIS — D509 Iron deficiency anemia, unspecified: Secondary | ICD-10-CM | POA: Diagnosis not present

## 2021-10-11 DIAGNOSIS — I4811 Longstanding persistent atrial fibrillation: Secondary | ICD-10-CM | POA: Diagnosis not present

## 2021-10-11 DIAGNOSIS — N183 Chronic kidney disease, stage 3 unspecified: Secondary | ICD-10-CM | POA: Diagnosis not present

## 2021-10-11 DIAGNOSIS — I5022 Chronic systolic (congestive) heart failure: Secondary | ICD-10-CM | POA: Diagnosis not present

## 2021-10-11 DIAGNOSIS — I872 Venous insufficiency (chronic) (peripheral): Secondary | ICD-10-CM | POA: Diagnosis not present

## 2021-10-11 NOTE — Telephone Encounter (Signed)
Patient calls nurse line reporting the name of Zinc Oxide she has used.   Patient reports Careall Zinc Oxide 20%.  Will forward to PCP.

## 2021-10-12 MED ORDER — ZINC OXIDE 20 % EX OINT: 1.0000 "application " | TOPICAL_OINTMENT | CUTANEOUS | 1 refills | Status: DC | PRN

## 2021-10-12 NOTE — Telephone Encounter (Signed)
Rx sent for Zinc Oxide 20% with note to pharmacist requesting CareAll brand.

## 2021-10-12 NOTE — Assessment & Plan Note (Addendum)
History of stage 2 decubitus ulcer in December 2019 after CVA. It healed fully but patient now with several weeks of burning pain in the area.  Appears very well-healed on exam today without any significant skin breakdown. No signs of infection. -Reassurance provided -Continue lidocaine gel as needed for pain -Continue zinc barrier cream. Patient will call with the exact name so I can send refills -Discussed importance of frequent position changes to avoid new pressure injuries (advised donut pillow as well). -Offered trial of capsaicin cream as an adjunct, as pain seems to be neuropathic in nature. Patient declined.

## 2021-10-15 ENCOUNTER — Other Ambulatory Visit (HOSPITAL_COMMUNITY): Payer: Self-pay | Admitting: Cardiology

## 2021-10-17 DIAGNOSIS — L89152 Pressure ulcer of sacral region, stage 2: Secondary | ICD-10-CM | POA: Diagnosis not present

## 2021-10-17 DIAGNOSIS — I872 Venous insufficiency (chronic) (peripheral): Secondary | ICD-10-CM | POA: Diagnosis not present

## 2021-10-17 DIAGNOSIS — I2510 Atherosclerotic heart disease of native coronary artery without angina pectoris: Secondary | ICD-10-CM | POA: Diagnosis not present

## 2021-10-17 DIAGNOSIS — E1142 Type 2 diabetes mellitus with diabetic polyneuropathy: Secondary | ICD-10-CM | POA: Diagnosis not present

## 2021-10-17 DIAGNOSIS — I130 Hypertensive heart and chronic kidney disease with heart failure and stage 1 through stage 4 chronic kidney disease, or unspecified chronic kidney disease: Secondary | ICD-10-CM | POA: Diagnosis not present

## 2021-10-17 DIAGNOSIS — D509 Iron deficiency anemia, unspecified: Secondary | ICD-10-CM | POA: Diagnosis not present

## 2021-10-17 DIAGNOSIS — I5022 Chronic systolic (congestive) heart failure: Secondary | ICD-10-CM | POA: Diagnosis not present

## 2021-10-17 DIAGNOSIS — E1122 Type 2 diabetes mellitus with diabetic chronic kidney disease: Secondary | ICD-10-CM | POA: Diagnosis not present

## 2021-10-17 DIAGNOSIS — N183 Chronic kidney disease, stage 3 unspecified: Secondary | ICD-10-CM | POA: Diagnosis not present

## 2021-10-17 DIAGNOSIS — I4811 Longstanding persistent atrial fibrillation: Secondary | ICD-10-CM | POA: Diagnosis not present

## 2021-10-17 NOTE — Progress Notes (Signed)
ID:  [name], DOB 1940-12-14, MRN [id number]   Provider location: [place] [facility] Type of Visit: Established patient   PCP:  Alcus Dad, MD  Cardiologist:  Fransico Him, MD Primary HF: Dr. Aundra Dubin   History of Present Illness: [name] is a 80 y.o. female who has a history of CAD status post CABG 2009, ischemic cardiomyopathy with chronic systolic CHF, persistent atrial fibrillation with stroke in 03/2016 when INR was subtherapeutic, stage III CKD, morbid obesity, pulmonary hypertension, diabetes, GI bleed 03/2017, and severe sleep apnea.    Seen in [facility] clinics 09/10/18 and 09/24/18, both times with volume overload and marked peripheral edema with skin breakdown. Torsemide increased at each visit. Echo in 7/19 showed EF 45-50% with mild RV dilation/mild RV systolic dysfunction and D-shaped interventricular septum.    AKI earlier in 7/20 in setting of excessive torsemide, she had increased dose to 60 qam/40 qpm on her own.  This was cut back to 40 mg daily and losartan and spironolactone were stopped.   Echo in 9/20 showed EF 45% with mildly decreased RV systolic function, PASP 46 mmHg with dilated IVC.  PYP scan in 10/21 was negative.   Echo in 4/22 showed EF 45% with diffuse hypokinesis, mildly decreased RV systolic function, PASP 47, IVC dilated, moderate TR, mild MR.   Today she returns for HF follow up. Overall feeling fine. Her main complaint is she is worried her Cymbalta is causing balance issues and dry mouth. No significant dyspnea walking short distances with her walker. Denies abnormal bleeding, palpitations, CP, dizziness, edema, or PND/Orthopnea. Appetite ok. No fever or chills. Weight at home 201 pounds. Taking all medications. Has a [place] RN 2x/week.  ECG (personally reviewed): Atrial fibrillation 61 bpm   Labs (12/18): K 3.9, Creatinine 1.65 Labs (5/19): K 4.1, creatinine 1.7 Labs (6/19): hgb 8.2 Labs (8/19): K 4.3, creatinine  1.73 Labs (9/19): K 4, creatinine 1.56 Labs (11/19): K 4.7, creatinine 2.04 Labs (12/19): creatinine 1.44 Labs (7/20): K 5, creatinine 2.49 => 1.86, Hgb 11.1 Labs (8/20): K 3.9, creatinine 1.79 Labs (9/20): LDL 50 Labs (11/20): K 3.8, creatinine 2.01 Labs (2/21): K 4, creatinine 1.9 Labs (7/21): K 4.2, creatinine 1.84, hgb 11.2 Labs (9/21): myeloma panel negative, urine immunofixation negative, LDL 50, K 4.6, creatinine 1.76 Labs (3/22): hgb 13.4, K 4.3, creatinine 1.86 Labs (5/22): K 4.5, creatinine 1.98 Labs (10/22): K 4.2, creatinine 2.11  Review of systems complete and found to be negative unless listed in HPI.    Past Medical History 1. Chronic systolic CHF: Ischemic cardiomyopathy with prominent RV failure.   - Echo (11/18): LVEF 35-40%, severe RV dilation, severe RAE, severe TR.  - Echo (7/19): EF 45-50%, diffuse hypokinesis, mild RV dilation with mildly decreased RV systolic function, D-shaped interventricular septum suggestive of RV pressure/volume overload, mild MR, moderate TR, PASP 49 mmhg.  - Echo (9/20): EF 45%, mild LV dilation, mild RV dilation with mildly decreased systolic function, PASP 46 mmHg, IVC dilated.  - PYP scan (10/21): Negative.  - Echo (4/22): EF 45% with diffuse hypokinesis, mildly decreased RV systolic function, PASP 47, IVC dilated, moderate TR, mild MR. 2. CAD: CABG 2009. No angiography since that time.   3. HTN 4. Atrial fibrillation: Chronic since 2013.  5. CKD stage 3 6. Morbid obesity 7. OSA 8. Chronic venous stasis with RLE wound/[place] 9. CVA 5/18.   Current Outpatient Medications  Medication Sig Dispense Refill   acetaminophen (  TYLENOL 8 HOUR) 650 MG CR tablet Take 1 tablet (650 mg total) by mouth every 8 (eight) hours as needed for pain. 30 tablet 0   atorvastatin (LIPITOR) 40 MG tablet Take 1 tablet (40 mg total) by mouth daily. 100 tablet 2   Blood Glucose Monitoring Suppl (ONE TOUCH ULTRA 2) w/Device KIT USE TO CHECK BLOOD SUGAR UPTO 3  TIMES A DAY 1 kit 0   carvedilol (COREG) 6.25 MG tablet TAKE 1 TABLET TWICE A DAY  WITH MEALS 180 tablet 3   cholecalciferol (VITAMIN D3) 25 MCG (1000 UNIT) tablet Take 1,000 Units by mouth daily.     docusate sodium (COLACE) 100 MG capsule Take 100 mg by mouth daily as needed for moderate constipation.     DULoxetine (CYMBALTA) 20 MG capsule TAKE 1 CAPSULE DAILY 90 capsule 3   ELIQUIS 5 MG TABS tablet TAKE 1 TABLET TWICE A DAY 180 tablet 3   empagliflozin (JARDIANCE) 10 MG TABS tablet Take 1 tablet (10 mg total) by mouth daily before breakfast. 100 tablet 2   ferrous sulfate 325 (65 FE) MG tablet TAKE 1 TABLET BY MOUTH EVERY DAY WITH BREAKFAST 90 tablet 1   glucose blood (ONE TOUCH ULTRA TEST) test strip Use three times a day 100 each 3   Lancets (ONETOUCH DELICA PLUS LANCET33G) MISC 1 application by Does not apply route daily. Please use to check blood sugar up to three times daily. 100 each 6   lidocaine (XYLOCAINE) 5 % ointment Apply 1 application topically as needed. 35.44 g 0   metolazone (ZAROXOLYN) 2.5 MG tablet Take only as directed by CHF clinic (Patient taking differently: Take 2.5 mg by mouth daily as needed (Take only as directed by CHF clinic).) 10 tablet 0   nitroGLYCERIN (NITROSTAT) 0.4 MG SL tablet PLACE 1 TABLET (0.4 MG TOTAL) UNDER THE TONGUE EVERY 5 (FIVE) MINUTES AS NEEDED FOR CHEST PAIN (UP TO 3 DOSES). 15 tablet 5   potassium chloride SA (KLOR-CON) 20 MEQ tablet Take 3 tablets (60 mEq total) by mouth daily. 450 tablet 3   rOPINIRole (REQUIP) 0.5 MG tablet TAKE 2 TABLETS AT BEDTIME 180 tablet 3   spironolactone (ALDACTONE) 25 MG tablet Take 0.5 tablets (12.5 mg total) by mouth daily. 50 tablet 2   torsemide (DEMADEX) 20 MG tablet Take 3 tablets (60 mg total) by mouth 2 (two) times daily. 180 tablet 0   traMADol (ULTRAM) 50 MG tablet Take 1 tablet (50 mg total) by mouth every 6 (six) hours as needed. 20 tablet 0   vitamin B-12 (CYANOCOBALAMIN) 1000 MCG tablet Take 1 tablet  (1,000 mcg total) by mouth daily. 90 tablet 1   zinc oxide 20 % ointment Apply 1 application topically as needed (buttocks irritation). 425 g 1   No current facility-administered medications for this encounter.   Allergies  Allergen Reactions   Benazepril Other (See Comments)    Unknown reaction at age 32-65 - possibly dizziness   Ozempic (0.25 Or 0.5 Mg-Dose) [Semaglutide(0.25 Or 0.5mg -Dos)] Diarrhea    Gi intollerance   Tape     TOLERATES PAPER TAPE ONLY- due to thin skin. NOT ALLERGY PER PT   Angiotensin Receptor Blockers Other (See Comments)    Hypotension reaction   Fe-Succ-C-Thre-B12-Des Stomach Other (See Comments)    Unknown reaction   Sulfamethoxazole-Trimethoprim Other (See Comments)    Unknown reaction   Social History   Socioeconomic History   Marital status: Widowed    Spouse name: Not on file  Number of children: 2   Years of education: 12   Highest education level: Not on file  Occupational History    Comment: retired  Tobacco Use   Smoking status: Never   Smokeless tobacco: Never  Vaping Use   Vaping Use: Never used  Substance and Sexual Activity   Alcohol use: No   Drug use: Never   Sexual activity: Not Currently  Other Topics Concern   Not on file  Social History Narrative   Widowed   01/27/21 Lives with son,  in [place]   2 children   Not routinely exercising   Social Determinants of Health   Financial Resource Strain: Not on file  Food Insecurity: Not on file  Transportation Needs: Not on file  Physical Activity: Not on file  Stress: Not on file  Social Connections: Not on file  Intimate Partner Violence: Not on file   Family History  Problem Relation Age of Onset   Clotting disorder Mother        Cerebral hemorrhage   Other Mother        cerebral hemorrhage   Emphysema Father        COD   Lung cancer Brother    Heart disease Neg Hx    Pulse 62   Wt 92.6 kg (204 lb 3.2 oz)   LMP  (LMP Unknown)   SpO2 100%   BMI 30.16 kg/m    Wt Readings from Last 3 Encounters:  10/18/21 92.6 kg (204 lb 3.2 oz)  10/10/21 92.2 kg (203 lb 4 oz)  09/15/21 88.9 kg (196 lb)    Physical Exam:   General:  NAD. No resp difficulty, walked into clinic w/ walker HEENT: Normal Neck: Supple. No JVD. Carotids 2+ bilat; no bruits. No lymphadenopathy or thryomegaly appreciated. Cor: PMI nondisplaced. Irregular rate & rhythm. No rubs, gallops or murmurs. Lungs: Clear Abdomen: Obese, nontender, nondistended. No hepatosplenomegaly. No bruits or masses. Good bowel sounds. Extremities: No cyanosis, clubbing, rash, edema Neuro: Alert & oriented x 3, cranial nerves grossly intact. Moves all 4 extremities w/o difficulty. Affect pleasant.  ASSESSMENT & PLAN: 1. Chronic systolic CHF with prominent RV failure: Ischemic cardiomyopathy.  Echo in 7/19 showed LV EF 45-50% (improved) with mildly dilated/mildly dysfunction RV.  D-shaped septum suggested RV pressure/volume overload.  Echo in 9/20 showed EF 45%, mildly dysfunctional RV, PASP 46 mmHg with dilated IVC.  PYP scan negative and myeloma workup negative, no evidence for cardiac amyloidosis.  Echo today showed EF 45% with diffuse hypokinesis, mildly decreased RV systolic function, PASP 47, IVC dilated, moderate TR, mild MR.  She does not look volume overloaded on exam today.  NYHA class II.  Overall seems to be doing better.  - Continue Jardiance 10 mg daily. No recent GU symptoms. - Continue torsemide 60 mg bid.  BMET today.   - Continue Coreg 6.25 mg bid.   - Continue spironolactone 12.5 mg daily.   - We have discussed Cardiomems but since she has been doing quite well recently, will hold off.  If she decompensates in the future, this would be an option.  2. CAD s/p CABG: No chest pain.  - Continue atorvastatin 40 mg daily. Good lipids in 9/21.  Check lipids today.   - No ASA with stable CAD on Eliquis.  3. Atrial fibrillation: Chronic. Given long-term atrial fibrillation, she is unlikely to  successfully cardiovert.  - Continue Eliquis 5 mg bid.  Recent CBC stable. 4. CKD stage 3: BMET today.  5. H/o CVA: Continue Eliquis.   6. OSA: She is using CPAP.  7. HTN: BP well-controlled.   8. DM 2: A1c 7.6 10/22. She is no longer on insulin. - Continue Jardiance. - She has neuropathy and we discussed duloxetine is helpful for these symptoms. Upon chart review, her PCP addressed her medication concerns regarding duloxetine 12/21. She was offered capsaicin cream but declined.  - Recommend she follow up with PCP.  Followup in 3 months with Dr. Aundra Dubin.    Signed, Rafael Bihari, FNP  10/18/2021  [facility] [address] [facility] [place] [address] [phone] (office) [phone] (fax)

## 2021-10-18 ENCOUNTER — Other Ambulatory Visit: Payer: Self-pay

## 2021-10-18 ENCOUNTER — Encounter (HOSPITAL_COMMUNITY): Payer: Self-pay

## 2021-10-18 ENCOUNTER — Ambulatory Visit (HOSPITAL_COMMUNITY)
Admission: RE | Admit: 2021-10-18 | Discharge: 2021-10-18 | Disposition: A | Discharge status: Home / Self Care | Payer: Medicare HMO | Source: Ambulatory Visit | Attending: Family Medicine | Admitting: Family Medicine

## 2021-10-18 VITALS — BP 100/62 | HR 62 | Wt 204.2 lb

## 2021-10-18 DIAGNOSIS — G4733 Obstructive sleep apnea (adult) (pediatric): Secondary | ICD-10-CM | POA: Diagnosis not present

## 2021-10-18 DIAGNOSIS — Z79899 Other long term (current) drug therapy: Secondary | ICD-10-CM | POA: Diagnosis not present

## 2021-10-18 DIAGNOSIS — Z9989 Dependence on other enabling machines and devices: Secondary | ICD-10-CM | POA: Diagnosis not present

## 2021-10-18 DIAGNOSIS — I2510 Atherosclerotic heart disease of native coronary artery without angina pectoris: Secondary | ICD-10-CM | POA: Insufficient documentation

## 2021-10-18 DIAGNOSIS — R2689 Other abnormalities of gait and mobility: Secondary | ICD-10-CM | POA: Diagnosis not present

## 2021-10-18 DIAGNOSIS — R682 Dry mouth, unspecified: Secondary | ICD-10-CM | POA: Diagnosis not present

## 2021-10-18 DIAGNOSIS — E1140 Type 2 diabetes mellitus with diabetic neuropathy, unspecified: Secondary | ICD-10-CM | POA: Insufficient documentation

## 2021-10-18 DIAGNOSIS — Z7901 Long term (current) use of anticoagulants: Secondary | ICD-10-CM | POA: Diagnosis not present

## 2021-10-18 DIAGNOSIS — Z7984 Long term (current) use of oral hypoglycemic drugs: Secondary | ICD-10-CM | POA: Diagnosis not present

## 2021-10-18 DIAGNOSIS — I255 Ischemic cardiomyopathy: Secondary | ICD-10-CM | POA: Diagnosis not present

## 2021-10-18 DIAGNOSIS — I4819 Other persistent atrial fibrillation: Secondary | ICD-10-CM

## 2021-10-18 DIAGNOSIS — I447 Left bundle-branch block, unspecified: Secondary | ICD-10-CM | POA: Insufficient documentation

## 2021-10-18 DIAGNOSIS — N183 Chronic kidney disease, stage 3 unspecified: Secondary | ICD-10-CM | POA: Diagnosis not present

## 2021-10-18 DIAGNOSIS — Z951 Presence of aortocoronary bypass graft: Secondary | ICD-10-CM | POA: Insufficient documentation

## 2021-10-18 DIAGNOSIS — Z6830 Body mass index (BMI) 30.0-30.9, adult: Secondary | ICD-10-CM | POA: Diagnosis not present

## 2021-10-18 DIAGNOSIS — E1122 Type 2 diabetes mellitus with diabetic chronic kidney disease: Secondary | ICD-10-CM | POA: Diagnosis not present

## 2021-10-18 DIAGNOSIS — I10 Essential (primary) hypertension: Secondary | ICD-10-CM | POA: Diagnosis not present

## 2021-10-18 DIAGNOSIS — I130 Hypertensive heart and chronic kidney disease with heart failure and stage 1 through stage 4 chronic kidney disease, or unspecified chronic kidney disease: Secondary | ICD-10-CM | POA: Diagnosis not present

## 2021-10-18 DIAGNOSIS — I482 Chronic atrial fibrillation, unspecified: Secondary | ICD-10-CM | POA: Insufficient documentation

## 2021-10-18 DIAGNOSIS — I272 Pulmonary hypertension, unspecified: Secondary | ICD-10-CM | POA: Insufficient documentation

## 2021-10-18 DIAGNOSIS — Z8673 Personal history of transient ischemic attack (TIA), and cerebral infarction without residual deficits: Secondary | ICD-10-CM | POA: Insufficient documentation

## 2021-10-18 DIAGNOSIS — I5022 Chronic systolic (congestive) heart failure: Secondary | ICD-10-CM | POA: Diagnosis not present

## 2021-10-18 LAB — BASIC METABOLIC PANEL
Anion gap: 8 (ref 5–15)
BUN: 59 mg/dL — ABNORMAL HIGH (ref 8–23)
CO2: 30 mmol/L (ref 22–32)
Calcium: 9.5 mg/dL (ref 8.9–10.3)
Chloride: 99 mmol/L (ref 98–111)
Creatinine, Ser: 1.71 mg/dL — ABNORMAL HIGH (ref 0.44–1.00)
GFR, Estimated: 30 mL/min — ABNORMAL LOW (ref >60)
Glucose, Bld: 178 mg/dL — ABNORMAL HIGH (ref 70–99)
Potassium: 4.6 mmol/L (ref 3.5–5.1)
Sodium: 137 mmol/L (ref 135–145)

## 2021-10-18 LAB — LIPID PANEL
Cholesterol: 103 mg/dL (ref 0–200)
HDL: 48 mg/dL (ref >40)
LDL Cholesterol: 47 mg/dL (ref 0–99)
Total CHOL/HDL Ratio: 2.1 RATIO
Triglycerides: 41 mg/dL (ref <150)
VLDL: 8 mg/dL (ref 0–40)

## 2021-10-18 NOTE — Patient Instructions (Signed)
Thank You for coming in today  EKG was done today  Labs were done if any labs are abnormal the clinic will call you  Your physician recommends that you schedule a follow-up appointment in: 3 months with Dr. Aundra Dubin  At the [facility], you and your health needs are our priority. As part of our continuing mission to provide you with exceptional heart care, we have created designated Provider Care Teams. These Care Teams include your primary Cardiologist (physician) and Advanced Practice Providers (APPs- Physician Assistants and Nurse Practitioners) who all work together to provide you with the care you need, when you need it.   You may see any of the following providers on your designated Care Team at your next follow up: Dr Glori Bickers Dr Haynes Kerns, NP Lyda Jester, [place] [facility], [place], [place] Audry Riles, PharmD   Please be sure to bring in all your medications bottles to every appointment.    If you have any questions or concerns before your next appointment please send [place] a message through [place] or call our office at [phone].    TO LEAVE A MESSAGE FOR THE NURSE SELECT OPTION 2, PLEASE LEAVE A MESSAGE INCLUDING: YOUR NAME DATE OF BIRTH CALL BACK NUMBER REASON FOR CALL**this is important as we prioritize the call backs  YOU WILL RECEIVE A CALL BACK THE SAME DAY AS LONG AS YOU CALL BEFORE 4:00 PM

## 2021-10-25 DIAGNOSIS — L89152 Pressure ulcer of sacral region, stage 2: Secondary | ICD-10-CM | POA: Diagnosis not present

## 2021-10-25 DIAGNOSIS — I4811 Longstanding persistent atrial fibrillation: Secondary | ICD-10-CM | POA: Diagnosis not present

## 2021-10-25 DIAGNOSIS — I2510 Atherosclerotic heart disease of native coronary artery without angina pectoris: Secondary | ICD-10-CM | POA: Diagnosis not present

## 2021-10-25 DIAGNOSIS — E1142 Type 2 diabetes mellitus with diabetic polyneuropathy: Secondary | ICD-10-CM | POA: Diagnosis not present

## 2021-10-25 DIAGNOSIS — I5022 Chronic systolic (congestive) heart failure: Secondary | ICD-10-CM | POA: Diagnosis not present

## 2021-10-25 DIAGNOSIS — I872 Venous insufficiency (chronic) (peripheral): Secondary | ICD-10-CM | POA: Diagnosis not present

## 2021-10-25 DIAGNOSIS — N183 Chronic kidney disease, stage 3 unspecified: Secondary | ICD-10-CM | POA: Diagnosis not present

## 2021-10-25 DIAGNOSIS — E1122 Type 2 diabetes mellitus with diabetic chronic kidney disease: Secondary | ICD-10-CM | POA: Diagnosis not present

## 2021-10-25 DIAGNOSIS — D509 Iron deficiency anemia, unspecified: Secondary | ICD-10-CM | POA: Diagnosis not present

## 2021-10-25 DIAGNOSIS — I130 Hypertensive heart and chronic kidney disease with heart failure and stage 1 through stage 4 chronic kidney disease, or unspecified chronic kidney disease: Secondary | ICD-10-CM | POA: Diagnosis not present

## 2021-10-28 ENCOUNTER — Other Ambulatory Visit (HOSPITAL_COMMUNITY): Payer: Self-pay | Admitting: Cardiology

## 2021-10-31 DIAGNOSIS — E1142 Type 2 diabetes mellitus with diabetic polyneuropathy: Secondary | ICD-10-CM | POA: Diagnosis not present

## 2021-10-31 DIAGNOSIS — I872 Venous insufficiency (chronic) (peripheral): Secondary | ICD-10-CM | POA: Diagnosis not present

## 2021-10-31 DIAGNOSIS — N183 Chronic kidney disease, stage 3 unspecified: Secondary | ICD-10-CM | POA: Diagnosis not present

## 2021-10-31 DIAGNOSIS — E1122 Type 2 diabetes mellitus with diabetic chronic kidney disease: Secondary | ICD-10-CM | POA: Diagnosis not present

## 2021-10-31 DIAGNOSIS — I5022 Chronic systolic (congestive) heart failure: Secondary | ICD-10-CM | POA: Diagnosis not present

## 2021-10-31 DIAGNOSIS — I2510 Atherosclerotic heart disease of native coronary artery without angina pectoris: Secondary | ICD-10-CM | POA: Diagnosis not present

## 2021-10-31 DIAGNOSIS — D509 Iron deficiency anemia, unspecified: Secondary | ICD-10-CM | POA: Diagnosis not present

## 2021-10-31 DIAGNOSIS — I4811 Longstanding persistent atrial fibrillation: Secondary | ICD-10-CM | POA: Diagnosis not present

## 2021-10-31 DIAGNOSIS — L89152 Pressure ulcer of sacral region, stage 2: Secondary | ICD-10-CM | POA: Diagnosis not present

## 2021-10-31 DIAGNOSIS — I130 Hypertensive heart and chronic kidney disease with heart failure and stage 1 through stage 4 chronic kidney disease, or unspecified chronic kidney disease: Secondary | ICD-10-CM | POA: Diagnosis not present

## 2021-11-07 DIAGNOSIS — D509 Iron deficiency anemia, unspecified: Secondary | ICD-10-CM | POA: Diagnosis not present

## 2021-11-07 DIAGNOSIS — I2510 Atherosclerotic heart disease of native coronary artery without angina pectoris: Secondary | ICD-10-CM | POA: Diagnosis not present

## 2021-11-07 DIAGNOSIS — E1122 Type 2 diabetes mellitus with diabetic chronic kidney disease: Secondary | ICD-10-CM | POA: Diagnosis not present

## 2021-11-07 DIAGNOSIS — I5022 Chronic systolic (congestive) heart failure: Secondary | ICD-10-CM | POA: Diagnosis not present

## 2021-11-07 DIAGNOSIS — E1142 Type 2 diabetes mellitus with diabetic polyneuropathy: Secondary | ICD-10-CM | POA: Diagnosis not present

## 2021-11-07 DIAGNOSIS — N183 Chronic kidney disease, stage 3 unspecified: Secondary | ICD-10-CM | POA: Diagnosis not present

## 2021-11-07 DIAGNOSIS — I4811 Longstanding persistent atrial fibrillation: Secondary | ICD-10-CM | POA: Diagnosis not present

## 2021-11-07 DIAGNOSIS — L89152 Pressure ulcer of sacral region, stage 2: Secondary | ICD-10-CM | POA: Diagnosis not present

## 2021-11-07 DIAGNOSIS — I130 Hypertensive heart and chronic kidney disease with heart failure and stage 1 through stage 4 chronic kidney disease, or unspecified chronic kidney disease: Secondary | ICD-10-CM | POA: Diagnosis not present

## 2021-11-07 DIAGNOSIS — I872 Venous insufficiency (chronic) (peripheral): Secondary | ICD-10-CM | POA: Diagnosis not present

## 2021-11-20 ENCOUNTER — Ambulatory Visit: Payer: Medicare HMO | Admitting: Podiatry

## 2021-12-12 ENCOUNTER — Telehealth: Payer: Self-pay

## 2021-12-12 DIAGNOSIS — L89152 Pressure ulcer of sacral region, stage 2: Secondary | ICD-10-CM

## 2021-12-12 NOTE — Telephone Encounter (Signed)
New home health referral placed.

## 2021-12-12 NOTE — Telephone Encounter (Signed)
Patient calls nurse line requesting restart of care for home health nursing for wound care. Patient reports that she was recently discharged by [facility], however, since then her sacral wound has worsened. Patient denies fever, however, increased amount of pain to the area.   There is a referral from 09/01/2021, however, unsure if new referral will need to be placed as they discharged patient from services.   Will forward to PCP as well as [place] for further guidance.   Talbot Grumbling, RN

## 2021-12-12 NOTE — Telephone Encounter (Signed)
Will ask provider to please place a new home health order.  Patient may also need a face to face visit (or virtual) to go along with the referral.  Ellyce Lafevers,CMA

## 2021-12-13 DIAGNOSIS — I25119 Atherosclerotic heart disease of native coronary artery with unspecified angina pectoris: Secondary | ICD-10-CM | POA: Diagnosis not present

## 2021-12-13 DIAGNOSIS — D6869 Other thrombophilia: Secondary | ICD-10-CM | POA: Diagnosis not present

## 2021-12-13 DIAGNOSIS — I509 Heart failure, unspecified: Secondary | ICD-10-CM | POA: Diagnosis not present

## 2021-12-13 DIAGNOSIS — E785 Hyperlipidemia, unspecified: Secondary | ICD-10-CM | POA: Diagnosis not present

## 2021-12-13 DIAGNOSIS — R69 Illness, unspecified: Secondary | ICD-10-CM | POA: Diagnosis not present

## 2021-12-13 DIAGNOSIS — E1165 Type 2 diabetes mellitus with hyperglycemia: Secondary | ICD-10-CM | POA: Diagnosis not present

## 2021-12-13 DIAGNOSIS — I4891 Unspecified atrial fibrillation: Secondary | ICD-10-CM | POA: Diagnosis not present

## 2021-12-13 DIAGNOSIS — I429 Cardiomyopathy, unspecified: Secondary | ICD-10-CM | POA: Diagnosis not present

## 2021-12-13 DIAGNOSIS — I110 Hypertensive heart disease with heart failure: Secondary | ICD-10-CM | POA: Diagnosis not present

## 2021-12-13 DIAGNOSIS — E1151 Type 2 diabetes mellitus with diabetic peripheral angiopathy without gangrene: Secondary | ICD-10-CM | POA: Diagnosis not present

## 2021-12-13 DIAGNOSIS — E1142 Type 2 diabetes mellitus with diabetic polyneuropathy: Secondary | ICD-10-CM | POA: Diagnosis not present

## 2021-12-14 ENCOUNTER — Ambulatory Visit: Payer: Medicare HMO | Admitting: Podiatry

## 2021-12-14 ENCOUNTER — Other Ambulatory Visit: Payer: Self-pay | Admitting: *Deleted

## 2021-12-14 ENCOUNTER — Encounter: Payer: Self-pay | Admitting: Podiatry

## 2021-12-14 DIAGNOSIS — B351 Tinea unguium: Secondary | ICD-10-CM

## 2021-12-14 DIAGNOSIS — E1169 Type 2 diabetes mellitus with other specified complication: Secondary | ICD-10-CM

## 2021-12-14 DIAGNOSIS — E1151 Type 2 diabetes mellitus with diabetic peripheral angiopathy without gangrene: Secondary | ICD-10-CM

## 2021-12-14 NOTE — Progress Notes (Signed)
° ° °  SUBJECTIVE:   CHIEF COMPLAINT / HPI:   Sacral wound follow-up -[name] notes that she has had [facility] home health come out in the past and they had placed a foam pad on her sacral ulcer, she notes it hurts when she sits on it and they had not been checking it and she was discharged from home health about a month ago.  She denies any fevers, chills, itching, bleeding but notes pain when she sits on this and would like it assessed.  She describes it as a burning pain.  She notes she was told she needed a face-to-face for this.  Type 2 diabetes mellitus-she takes Jardiance 10 mg daily.  She used to be on insulin but stopped because her A1c got down to 5.9.  She has not had any side effects with the Jardiance.  She has stopped metformin due to her kidney function.  She notes recently her son was diagnosed with renal cancer in the past month and so she has been eating poorly due to stress.  She notes when she test her fasting plasma glucoses they are about 160s.  Paroxysmal A. fib-she takes her metoprolol and Eliquis daily.  Denies any chest pain, palpitations, shortness of breath, blood in stools, melena.  CHF-taking her home diuretics per cardiology, she denies any worsening leg swelling, orthopnea, shortness of breath.  PERTINENT  PMH / PSH: CAD, CVA, CHF 2/2 ischemic cardiomyopathy, pulm HTN, pAfib, OSA  OBJECTIVE:   BP 124/60    Pulse (!) 57    Wt 211 lb 3.2 oz (95.8 kg)    LMP  (LMP Unknown)    SpO2 99%    BMI 31.19 kg/m   General: A&O, NAD HEENT: No sign of trauma, EOM grossly intact Cardiac: RRR, no m/r/g Respiratory: CTAB, normal WOB, no w/c/r GI: non-distended  Sacrum: 2 cm x 2 cm stage I pressure ulcer, nonblanching, no skin breakdown, no purulence or drainage noted in gluteal crease Extremities: NTTP, bilateral nonpitting symmetric edema Neuro: Normal gait, walks with walker moves all four extremities appropriately. Psych: Appropriate mood and affect   ASSESSMENT/PLAN:    Stage 1 skin ulcer of sacral region (HCC) - Stage I pressure ulcer noticed on exam today, no skin breakdown or signs of infection, replaced referral to home health for assistance with wound care she notes this is a hard area for her to place any pads or creams on  DM type 2 with diabetic peripheral neuropathy ([place]) - Continue home Jardiance 10 mg daily, patient notes she will work on diet it is of been worse in the past month since her son was diagnosed with cancer, no medication changes today, continue checking her blood glucose and follow-up in 1 to 2 months to reassess  Atrial fibrillation ([place]) - Stable, on Eliquis and Coreg, no signs or symptoms of bleeding  Chronic systolic heart failure ([place]) - Stable, no signs of CHF exacerbation, continue home medications     Lenoria Chime, MD [place]

## 2021-12-14 NOTE — Progress Notes (Signed)
°  Subjective:  Patient ID: [name], female    DOB: Oct 01, 1941,  MRN: [id number]  Chief Complaint  Patient presents with   Nail Problem    Trim nails    81 y.o. female presents with the above complaint. History confirmed with patient.    Objective:  Physical Exam: warm, good capillary refill, nail exam onychomycosis of the toenails, no trophic changes or ulcerative lesions, normal DP and non-palp PT pulses, and normal sensory exam. Left Foot: bunion deformity noted and hammertoes   Right Foot: bunion deformity noted and hammertoes    No images are attached to the encounter.  Assessment:   1. Onychomycosis of multiple toenails with type 2 diabetes mellitus and peripheral angiopathy ([place])    Plan:  Patient was evaluated and treated and all questions answered.  Onychomycosis, Diabetes and PAD -Nails debrided x10, meets criteria for at risk foot care  Procedure: Nail Debridement Type of Debridement: manual, sharp debridement. Instrumentation: Nail nipper, rotary burr. Number of Nails: 10 Disposition: Patient tolerated well without iatrogenic injury.   No follow-ups on file.

## 2021-12-15 ENCOUNTER — Encounter: Payer: Self-pay | Admitting: Family Medicine

## 2021-12-15 ENCOUNTER — Ambulatory Visit (INDEPENDENT_AMBULATORY_CARE_PROVIDER_SITE_OTHER): Payer: Medicare HMO | Admitting: Family Medicine

## 2021-12-15 ENCOUNTER — Other Ambulatory Visit: Payer: Self-pay

## 2021-12-15 VITALS — BP 124/60 | HR 57 | Wt 211.2 lb

## 2021-12-15 DIAGNOSIS — I4891 Unspecified atrial fibrillation: Secondary | ICD-10-CM

## 2021-12-15 DIAGNOSIS — E1142 Type 2 diabetes mellitus with diabetic polyneuropathy: Secondary | ICD-10-CM

## 2021-12-15 DIAGNOSIS — L98429 Non-pressure chronic ulcer of back with unspecified severity: Secondary | ICD-10-CM

## 2021-12-15 DIAGNOSIS — I5022 Chronic systolic (congestive) heart failure: Secondary | ICD-10-CM

## 2021-12-15 LAB — POCT GLYCOSYLATED HEMOGLOBIN (HGB A1C): HbA1c, POC (controlled diabetic range): 8 % — AB (ref 0.0–7.0)

## 2021-12-15 NOTE — Assessment & Plan Note (Signed)
>>  ASSESSMENT AND PLAN FOR STAGE 1 SKIN ULCER OF SACRAL REGION (HCC) WRITTEN ON 12/15/2021  9:14 AM BY AY, MARGARET E, MD  - Stage I pressure ulcer noticed on exam today, no skin breakdown or signs of infection, replaced referral to home health for assistance with wound care she notes this is a hard area for her to place any pads or creams on

## 2021-12-15 NOTE — Patient Instructions (Signed)
It was wonderful to see you today.  Please bring ALL of your medications with you to every visit.   Today we talked about:  - You have a stage 1 pressure ulcer, I have placed a referral to home health for wound care- it does not have skin breakdown and does not look infected - Your Hgb A1c was 8.0, we will continue your home Jardiance and checking your BG and watching your diet and follow up with PCP in 1 month to reassess   Thank you for choosing [facility].   Please call [phone] with any questions about today's appointment.  Please be sure to schedule follow up at the front  desk before you leave today.   Yehuda Savannah, MD  Family Medicine

## 2021-12-15 NOTE — Assessment & Plan Note (Signed)
-   Stage I pressure ulcer noticed on exam today, no skin breakdown or signs of infection, replaced referral to home health for assistance with wound care she notes this is a hard area for her to place any pads or creams on

## 2021-12-15 NOTE — Assessment & Plan Note (Signed)
-   Stable, on Eliquis and Coreg, no signs or symptoms of bleeding

## 2021-12-15 NOTE — Assessment & Plan Note (Signed)
-   Stable, no signs of CHF exacerbation, continue home medications

## 2021-12-15 NOTE — Assessment & Plan Note (Signed)
-   Continue home Jardiance 10 mg daily, patient notes she will work on diet it is of been worse in the past month since her son was diagnosed with cancer, no medication changes today, continue checking her blood glucose and follow-up in 1 to 2 months to reassess

## 2021-12-19 DIAGNOSIS — I5022 Chronic systolic (congestive) heart failure: Secondary | ICD-10-CM | POA: Diagnosis not present

## 2021-12-19 DIAGNOSIS — E1122 Type 2 diabetes mellitus with diabetic chronic kidney disease: Secondary | ICD-10-CM | POA: Diagnosis not present

## 2021-12-19 DIAGNOSIS — I2510 Atherosclerotic heart disease of native coronary artery without angina pectoris: Secondary | ICD-10-CM | POA: Diagnosis not present

## 2021-12-19 DIAGNOSIS — E1142 Type 2 diabetes mellitus with diabetic polyneuropathy: Secondary | ICD-10-CM | POA: Diagnosis not present

## 2021-12-19 DIAGNOSIS — N183 Chronic kidney disease, stage 3 unspecified: Secondary | ICD-10-CM | POA: Diagnosis not present

## 2021-12-19 DIAGNOSIS — E113591 Type 2 diabetes mellitus with proliferative diabetic retinopathy without macular edema, right eye: Secondary | ICD-10-CM | POA: Diagnosis not present

## 2021-12-19 DIAGNOSIS — L89152 Pressure ulcer of sacral region, stage 2: Secondary | ICD-10-CM | POA: Diagnosis not present

## 2021-12-19 DIAGNOSIS — I130 Hypertensive heart and chronic kidney disease with heart failure and stage 1 through stage 4 chronic kidney disease, or unspecified chronic kidney disease: Secondary | ICD-10-CM | POA: Diagnosis not present

## 2021-12-19 DIAGNOSIS — H3530 Unspecified macular degeneration: Secondary | ICD-10-CM | POA: Diagnosis not present

## 2021-12-19 DIAGNOSIS — G894 Chronic pain syndrome: Secondary | ICD-10-CM | POA: Diagnosis not present

## 2021-12-20 ENCOUNTER — Telehealth: Payer: Self-pay

## 2021-12-20 NOTE — Telephone Encounter (Signed)
Abigail Butts from [facility] calling for nursing verbal orders as follows:  1 time(s) weekly for 8 week(s)  Verbal orders given per [facility] protocol  Talbot Grumbling, RN

## 2021-12-26 DIAGNOSIS — H3530 Unspecified macular degeneration: Secondary | ICD-10-CM | POA: Diagnosis not present

## 2021-12-26 DIAGNOSIS — I2510 Atherosclerotic heart disease of native coronary artery without angina pectoris: Secondary | ICD-10-CM | POA: Diagnosis not present

## 2021-12-26 DIAGNOSIS — L89152 Pressure ulcer of sacral region, stage 2: Secondary | ICD-10-CM | POA: Diagnosis not present

## 2021-12-26 DIAGNOSIS — E1122 Type 2 diabetes mellitus with diabetic chronic kidney disease: Secondary | ICD-10-CM | POA: Diagnosis not present

## 2021-12-26 DIAGNOSIS — G894 Chronic pain syndrome: Secondary | ICD-10-CM | POA: Diagnosis not present

## 2021-12-26 DIAGNOSIS — E113591 Type 2 diabetes mellitus with proliferative diabetic retinopathy without macular edema, right eye: Secondary | ICD-10-CM | POA: Diagnosis not present

## 2021-12-26 DIAGNOSIS — N183 Chronic kidney disease, stage 3 unspecified: Secondary | ICD-10-CM | POA: Diagnosis not present

## 2021-12-26 DIAGNOSIS — I5022 Chronic systolic (congestive) heart failure: Secondary | ICD-10-CM | POA: Diagnosis not present

## 2021-12-26 DIAGNOSIS — I130 Hypertensive heart and chronic kidney disease with heart failure and stage 1 through stage 4 chronic kidney disease, or unspecified chronic kidney disease: Secondary | ICD-10-CM | POA: Diagnosis not present

## 2021-12-26 DIAGNOSIS — E1142 Type 2 diabetes mellitus with diabetic polyneuropathy: Secondary | ICD-10-CM | POA: Diagnosis not present

## 2022-01-02 DIAGNOSIS — H3530 Unspecified macular degeneration: Secondary | ICD-10-CM | POA: Diagnosis not present

## 2022-01-02 DIAGNOSIS — E113591 Type 2 diabetes mellitus with proliferative diabetic retinopathy without macular edema, right eye: Secondary | ICD-10-CM | POA: Diagnosis not present

## 2022-01-02 DIAGNOSIS — N183 Chronic kidney disease, stage 3 unspecified: Secondary | ICD-10-CM | POA: Diagnosis not present

## 2022-01-02 DIAGNOSIS — I2510 Atherosclerotic heart disease of native coronary artery without angina pectoris: Secondary | ICD-10-CM | POA: Diagnosis not present

## 2022-01-02 DIAGNOSIS — L89152 Pressure ulcer of sacral region, stage 2: Secondary | ICD-10-CM | POA: Diagnosis not present

## 2022-01-02 DIAGNOSIS — I130 Hypertensive heart and chronic kidney disease with heart failure and stage 1 through stage 4 chronic kidney disease, or unspecified chronic kidney disease: Secondary | ICD-10-CM | POA: Diagnosis not present

## 2022-01-02 DIAGNOSIS — E1122 Type 2 diabetes mellitus with diabetic chronic kidney disease: Secondary | ICD-10-CM | POA: Diagnosis not present

## 2022-01-02 DIAGNOSIS — G894 Chronic pain syndrome: Secondary | ICD-10-CM | POA: Diagnosis not present

## 2022-01-02 DIAGNOSIS — E1142 Type 2 diabetes mellitus with diabetic polyneuropathy: Secondary | ICD-10-CM | POA: Diagnosis not present

## 2022-01-02 DIAGNOSIS — I5022 Chronic systolic (congestive) heart failure: Secondary | ICD-10-CM | POA: Diagnosis not present

## 2022-01-09 DIAGNOSIS — I130 Hypertensive heart and chronic kidney disease with heart failure and stage 1 through stage 4 chronic kidney disease, or unspecified chronic kidney disease: Secondary | ICD-10-CM | POA: Diagnosis not present

## 2022-01-09 DIAGNOSIS — E113591 Type 2 diabetes mellitus with proliferative diabetic retinopathy without macular edema, right eye: Secondary | ICD-10-CM | POA: Diagnosis not present

## 2022-01-09 DIAGNOSIS — E1122 Type 2 diabetes mellitus with diabetic chronic kidney disease: Secondary | ICD-10-CM | POA: Diagnosis not present

## 2022-01-09 DIAGNOSIS — H3530 Unspecified macular degeneration: Secondary | ICD-10-CM | POA: Diagnosis not present

## 2022-01-09 DIAGNOSIS — I2510 Atherosclerotic heart disease of native coronary artery without angina pectoris: Secondary | ICD-10-CM | POA: Diagnosis not present

## 2022-01-09 DIAGNOSIS — I5022 Chronic systolic (congestive) heart failure: Secondary | ICD-10-CM | POA: Diagnosis not present

## 2022-01-09 DIAGNOSIS — G894 Chronic pain syndrome: Secondary | ICD-10-CM | POA: Diagnosis not present

## 2022-01-09 DIAGNOSIS — E1142 Type 2 diabetes mellitus with diabetic polyneuropathy: Secondary | ICD-10-CM | POA: Diagnosis not present

## 2022-01-09 DIAGNOSIS — L89152 Pressure ulcer of sacral region, stage 2: Secondary | ICD-10-CM | POA: Diagnosis not present

## 2022-01-09 DIAGNOSIS — N183 Chronic kidney disease, stage 3 unspecified: Secondary | ICD-10-CM | POA: Diagnosis not present

## 2022-01-16 DIAGNOSIS — E113591 Type 2 diabetes mellitus with proliferative diabetic retinopathy without macular edema, right eye: Secondary | ICD-10-CM | POA: Diagnosis not present

## 2022-01-16 DIAGNOSIS — L89152 Pressure ulcer of sacral region, stage 2: Secondary | ICD-10-CM | POA: Diagnosis not present

## 2022-01-16 DIAGNOSIS — I130 Hypertensive heart and chronic kidney disease with heart failure and stage 1 through stage 4 chronic kidney disease, or unspecified chronic kidney disease: Secondary | ICD-10-CM | POA: Diagnosis not present

## 2022-01-16 DIAGNOSIS — I2510 Atherosclerotic heart disease of native coronary artery without angina pectoris: Secondary | ICD-10-CM | POA: Diagnosis not present

## 2022-01-16 DIAGNOSIS — G894 Chronic pain syndrome: Secondary | ICD-10-CM | POA: Diagnosis not present

## 2022-01-16 DIAGNOSIS — E1122 Type 2 diabetes mellitus with diabetic chronic kidney disease: Secondary | ICD-10-CM | POA: Diagnosis not present

## 2022-01-16 DIAGNOSIS — E1142 Type 2 diabetes mellitus with diabetic polyneuropathy: Secondary | ICD-10-CM | POA: Diagnosis not present

## 2022-01-16 DIAGNOSIS — I5022 Chronic systolic (congestive) heart failure: Secondary | ICD-10-CM | POA: Diagnosis not present

## 2022-01-16 DIAGNOSIS — H3530 Unspecified macular degeneration: Secondary | ICD-10-CM | POA: Diagnosis not present

## 2022-01-16 DIAGNOSIS — N183 Chronic kidney disease, stage 3 unspecified: Secondary | ICD-10-CM | POA: Diagnosis not present

## 2022-01-18 ENCOUNTER — Encounter (HOSPITAL_COMMUNITY): Payer: Medicare HMO | Admitting: Cardiology

## 2022-01-19 ENCOUNTER — Other Ambulatory Visit (HOSPITAL_COMMUNITY): Payer: Self-pay | Admitting: Cardiology

## 2022-01-29 ENCOUNTER — Encounter (INDEPENDENT_AMBULATORY_CARE_PROVIDER_SITE_OTHER): Payer: Medicare HMO | Admitting: Ophthalmology

## 2022-02-02 ENCOUNTER — Ambulatory Visit: Payer: Medicare HMO | Admitting: Family Medicine

## 2022-02-27 ENCOUNTER — Encounter: Payer: Self-pay | Admitting: Family Medicine

## 2022-02-27 ENCOUNTER — Other Ambulatory Visit: Payer: Self-pay

## 2022-02-27 ENCOUNTER — Ambulatory Visit (INDEPENDENT_AMBULATORY_CARE_PROVIDER_SITE_OTHER): Payer: Medicare HMO | Admitting: Family Medicine

## 2022-02-27 VITALS — BP 133/58 | HR 60 | Wt 213.6 lb

## 2022-02-27 DIAGNOSIS — M25562 Pain in left knee: Secondary | ICD-10-CM

## 2022-02-27 NOTE — Progress Notes (Signed)
? ? ?  SUBJECTIVE:  ? ?CHIEF COMPLAINT / HPI:  ? ?Left Knee Pain ?Started Thursday (~5 days ago) ?Got up to use the bathroom, felt like it gave out on her, pain started in that moment and has been hurting ever since. ?Using a knee brace that she finds very helpful. ?Taking Tylenol TID which is also helping somewhat ?Has not tried ice or heat ?Hurts more when walking, particularly when walking downhill. ?Uses a walker at baseline. ?No significant swelling. ? ?PERTINENT  PMH / PSH: osteoarthritis ? ?OBJECTIVE:  ? ?BP (!) 133/58   Pulse 60   Wt 213 lb 9.6 oz (96.9 kg)   LMP  (LMP Unknown)   SpO2 99%   BMI 31.54 kg/m?   ?Gen: alert, NAD, obese ?MSK: ?Left knee: inspection reveals no obvious deformity. No erythema or increased warmth. No significant effusion. Moderate joint line tenderness. Normal ROM with flexion and extension. No pain with varus and valgus stress. Gait is consistent with her baseline gait. ? ?ASSESSMENT/PLAN:  ? ?Left Knee Pain ?Acute x5 days, began after standing up awkwardly. Exam unremarkable aside from joint line tenderness. Suspect possible exacerbation of her underlying osteoarthritis. Less likely ligamentous injury given lack of effusion or laxity on exam. Regardless, management would likely be conservative in this 81 year old patient with multiple comorbidities. ?-Supportive care with Tylenol prn, rest, ice ?-Continue using brace per patient request/comfort ?-If no improvement in 1-2 weeks with supportive care, consider steroid injection vs obtaining imaging ? ? ?Alcus Dad, MD ?[place]  ?

## 2022-02-27 NOTE — Patient Instructions (Addendum)
It was great to see you! ? ?Things we discussed at today's visit: ?- It sounds like you may have aggravated your knee arthritis ?- Keep taking Tylenol three times per day ?- You can continue using the knee brace if you find it helpful. It will not help your healing in any way ?- Use ice for 15 minutes 3 times per day ?- Rest the knee as much as possible ?- If you're not getting better in another 2 weeks or so, come back and we will consider other options ? ?Take care and seek immediate care sooner if you develop any concerns. ? ?Dr. Rock Nephew ?[facility]  ?

## 2022-03-14 ENCOUNTER — Telehealth: Payer: Self-pay

## 2022-03-14 NOTE — Telephone Encounter (Signed)
Patient calls nurse line reporting continued left knee pain.  ? ?Patient reports she has been taking tylenol and using ice without relief.  ? ?Patient reports she would like to move forward with steroid injection per PCP office note.  ? ?Patient scheduled for 5/10. ?

## 2022-03-14 NOTE — Telephone Encounter (Signed)
Noted. Will discuss further at her appointment on 5/10. ? ? ?

## 2022-03-15 ENCOUNTER — Ambulatory Visit: Payer: Medicare HMO | Admitting: Podiatry

## 2022-03-15 ENCOUNTER — Encounter: Payer: Self-pay | Admitting: Podiatry

## 2022-03-15 DIAGNOSIS — M79674 Pain in right toe(s): Secondary | ICD-10-CM | POA: Diagnosis not present

## 2022-03-15 DIAGNOSIS — M79675 Pain in left toe(s): Secondary | ICD-10-CM

## 2022-03-15 DIAGNOSIS — B351 Tinea unguium: Secondary | ICD-10-CM

## 2022-03-15 DIAGNOSIS — S90411A Abrasion, right great toe, initial encounter: Secondary | ICD-10-CM | POA: Diagnosis not present

## 2022-03-15 DIAGNOSIS — E1151 Type 2 diabetes mellitus with diabetic peripheral angiopathy without gangrene: Secondary | ICD-10-CM

## 2022-03-21 ENCOUNTER — Ambulatory Visit: Payer: Medicare HMO | Admitting: Family Medicine

## 2022-03-25 NOTE — Progress Notes (Signed)
?  Subjective:  ?Patient ID: [name], female    DOB: 1941/08/01,  MRN: [id number] ? ?[name] presents to clinic today for painful thick toenails that are difficult to trim. Pain interferes with ambulation. Aggravating factors include wearing enclosed shoe gear. Pain is relieved with periodic professional debridement. ? ?Patient is diabetic. She has numbness and tingling in feet on occasion. ? ?New problem(s):  with chief concern of abrasion injury to right great toe. Injury occurred a few days ago. Injury recurred as a result of a direct trauma, hitting her foot on her walker. Patient states aggravating factor(s) is/are direct pressure.  Patient has tried no treatmentt.  ? ?PCP is Alcus Dad, MD , and last visit was February 27, 2022. ? ?Allergies  ?Allergen Reactions  ? Benazepril Other (See Comments)  ?  Unknown reaction at age 60-65 - possibly dizziness  ? Ozempic (0.25 Or 0.5 Mg-Dose) [Semaglutide(0.25 Or 0.'5mg'$ -Dos)] Diarrhea  ?  Gi intollerance  ? Tape   ?  TOLERATES PAPER TAPE ONLY- due to thin skin. NOT ALLERGY PER PT  ? Angiotensin Receptor Blockers Other (See Comments)  ?  Hypotension reaction  ? Fe-Succ-C-Thre-B12-Des Stomach Other (See Comments)  ?  Unknown reaction  ? Sulfamethoxazole-Trimethoprim Other (See Comments)  ?  Unknown reaction  ? ? ?Review of Systems: Negative except as noted in the HPI. ? ?Objective:  ? ?Vascular Examination: ?CFT <3 seconds b/l LE. Palpable DP pulse(s) b/l LE. Diminished PT pulse(s) b/l LE. Pedal hair absent. No pain with calf compression b/l. Lower extremity skin temperature gradient within normal limits. No edema noted b/l LE. No cyanosis or clubbing noted b/l LE.  ? ?Neurological Examination: ?Sensation grossly intact b/l with 10 gram monofilament. Vibratory sensation intact b/l.  ? ?Dermatological Examination: ?Skin warm and supple b/l lower extremities. No interdigital macerations noted b/l LE. Toenails 1-5 b/l elongated, discolored, dystrophic,  thickened, crumbly with subungual debris and tenderness to dorsal palpation. Healing abrasion(s) with intact scab noted R hallux. No erythema, no edema, no drainage, no fluctuance. ? ?Musculoskeletal Examination: ?Muscle strength 5/5 to b/l LE. HAV with bunion deformity noted b/l LE. Hammertoe deformity noted 2-5 b/l. Utilizes walker for ambulation assistance. ? ?Radiographs: None ? ?Last A1c:   ? ?  Latest Ref Rng & Units 12/15/2021  ?  8:34 AM 09/07/2021  ?  3:50 PM 07/14/2021  ?  3:09 PM  ?Hemoglobin A1C  ?Hemoglobin-A1c 0.0 - 7.0 % 8.0   7.6   7.7    ? ?Assessment/Plan: ?1. Pain due to onychomycosis of toenails of both feet   ?2. Abrasion, right great toe, initial encounter   ?3. Diabetes mellitus type 2 with peripheral artery disease ([place])   ?  ? ?-Patient was evaluated and treated. All patient's and/or POA's questions/concerns answered on today's visit. ?-Patient has aid that comes to assist her on MWF. She was instructed to apply triple antibiotic ointment to right great toe once daily on MWF for two weeks.. ?-Mycotic toenails 1-5 bilaterally were debrided in length and girth with sterile nail nippers and dremel without incident. ?-Patient/POA to call should there be question/concern in the interim.  ? ?Return in about 3 months (around 06/15/2022). ? ?Marzetta Board, DPM  ?

## 2022-03-28 ENCOUNTER — Ambulatory Visit (INDEPENDENT_AMBULATORY_CARE_PROVIDER_SITE_OTHER): Payer: Medicare HMO | Admitting: Ophthalmology

## 2022-03-28 ENCOUNTER — Encounter (INDEPENDENT_AMBULATORY_CARE_PROVIDER_SITE_OTHER): Payer: Medicare HMO | Admitting: Ophthalmology

## 2022-03-28 ENCOUNTER — Encounter (INDEPENDENT_AMBULATORY_CARE_PROVIDER_SITE_OTHER): Payer: Self-pay | Admitting: Ophthalmology

## 2022-03-28 DIAGNOSIS — H353122 Nonexudative age-related macular degeneration, left eye, intermediate dry stage: Secondary | ICD-10-CM | POA: Diagnosis not present

## 2022-03-28 DIAGNOSIS — H353222 Exudative age-related macular degeneration, left eye, with inactive choroidal neovascularization: Secondary | ICD-10-CM | POA: Diagnosis not present

## 2022-03-28 DIAGNOSIS — E113491 Type 2 diabetes mellitus with severe nonproliferative diabetic retinopathy without macular edema, right eye: Secondary | ICD-10-CM | POA: Diagnosis not present

## 2022-03-28 DIAGNOSIS — H353113 Nonexudative age-related macular degeneration, right eye, advanced atrophic without subfoveal involvement: Secondary | ICD-10-CM

## 2022-03-28 DIAGNOSIS — H4311 Vitreous hemorrhage, right eye: Secondary | ICD-10-CM | POA: Diagnosis not present

## 2022-03-28 DIAGNOSIS — E113492 Type 2 diabetes mellitus with severe nonproliferative diabetic retinopathy without macular edema, left eye: Secondary | ICD-10-CM | POA: Diagnosis not present

## 2022-03-28 NOTE — Assessment & Plan Note (Signed)
Sign of CNVM OS today continue to observe ?

## 2022-03-28 NOTE — Assessment & Plan Note (Signed)
No sign of CNVM OS 

## 2022-03-28 NOTE — Assessment & Plan Note (Addendum)
>>  ASSESSMENT AND PLAN FOR SEVERE NONPROLIFERATIVE DIABETIC RETINOPATHY OF BOTH EYES (HCC) WRITTEN ON 03/28/2022  4:22 PM BY Fawn Kirk A, MD  Good peripheral PRP, no sign of NVE, no PDR  >>ASSESSMENT AND PLAN FOR SEVERE NONPROLIFERATIVE DIABETIC RETINOPATHY OF LEFT EYE (HCC) WRITTEN ON 03/28/2022  4:22 PM BY Fawn Kirk A, MD  Stable OS yet we will need to watch closely for progression of PDR

## 2022-03-28 NOTE — Assessment & Plan Note (Signed)
OD stable counts for some visual difficulty ?

## 2022-03-28 NOTE — Assessment & Plan Note (Signed)
Stable OS yet we will need to watch closely for progression of PDR ?

## 2022-03-28 NOTE — Progress Notes (Signed)
? ? ?03/28/2022 ? ?  ? ?CHIEF COMPLAINT ?Patient presents for  ?Chief Complaint  ?Patient presents with  ? Diabetic Retinopathy without Macular Edema  ? ? ? ? ?HISTORY OF PRESENT ILLNESS: ?[name] is a 81 y.o. female who presents to the clinic today for:  ? ?HPI   ?6 mos for DILATE OU, COLOR FP, OCT. ?Pt stated vision has worsened since last visit. Pt reports using a magnifying glass to read fine print. ?Pt denies floaters and FOL. ? ? ? ?Last edited by Silvestre Moment on 03/28/2022  3:09 PM.  ?  ? ? ?Referring physician: ?Clent Jacks, MD ?[facility] ?[address] ?[place],  [place] [phone] ? ?HISTORICAL INFORMATION:  ? ?Selected notes from the [facility] ?  ? ?Lab Results  ?Component Value Date  ? HGBA1C 8.0 (A) 12/15/2021  ?  ? ?CURRENT MEDICATIONS: ?Current Outpatient Medications (Ophthalmic Drugs)  ?Medication Sig  ? Neomycin-Bacitracin Zn-Polymyx 3.5-400-10000 OINT   ? ?No current facility-administered medications for this visit. (Ophthalmic Drugs)  ? ?Current Outpatient Medications (Other)  ?Medication Sig  ? acetaminophen (TYLENOL 8 HOUR) 650 MG CR tablet Take 1 tablet (650 mg total) by mouth every 8 (eight) hours as needed for pain.  ? amoxicillin (AMOXIL) 500 MG capsule Take by mouth.  ? atorvastatin (LIPITOR) 40 MG tablet Take 1 tablet (40 mg total) by mouth daily.  ? Blood Glucose Monitoring Suppl (ONE TOUCH ULTRA 2) w/Device KIT USE TO CHECK BLOOD SUGAR UPTO 3 TIMES A DAY  ? carvedilol (COREG) 6.25 MG tablet TAKE 1 TABLET TWICE A DAY  WITH MEALS  ? Cholecalciferol 25 MCG (1000 UT) tablet Take by mouth.  ? cyanocobalamin 1000 MCG tablet Take by mouth.  ? docusate sodium (COLACE) 100 MG capsule Take 100 mg by mouth daily as needed for moderate constipation.  ? DULoxetine (CYMBALTA) 20 MG capsule TAKE 1 CAPSULE DAILY  ? ELIQUIS 5 MG TABS tablet TAKE 1 TABLET TWICE A DAY  ? empagliflozin (JARDIANCE) 10 MG TABS tablet Take 1 tablet (10 mg total) by mouth daily before breakfast.  ? ferrous sulfate 325 (65 FE) MG  tablet TAKE 1 TABLET BY MOUTH EVERY DAY WITH BREAKFAST  ? glucose blood (ONE TOUCH ULTRA TEST) test strip Use three times a day  ? Lancets (ONETOUCH DELICA PLUS [id number]) MISC 1 application by Does not apply route daily. Please use to check blood sugar up to three times daily.  ? lidocaine (XYLOCAINE) 5 % ointment Apply 1 application topically as needed.  ? metolazone (ZAROXOLYN) 2.5 MG tablet Take only as directed by CHF clinic (Patient taking differently: Take 2.5 mg by mouth daily as needed (Take only as directed by CHF clinic).)  ? nitroGLYCERIN (NITROSTAT) 0.4 MG SL tablet PLACE 1 TABLET (0.4 MG TOTAL) UNDER THE TONGUE EVERY 5 (FIVE) MINUTES AS NEEDED FOR CHEST PAIN (UP TO 3 DOSES).  ? potassium chloride SA (KLOR-CON M20) 20 MEQ tablet Take 3 tablets (60 mEq total) by mouth 2 (two) times daily. (Patient taking differently: Take 60 mEq by mouth daily.)  ? rOPINIRole (REQUIP) 0.5 MG tablet TAKE 2 TABLETS AT BEDTIME  ? silver sulfADIAZINE (SILVADENE) 1 % cream   ? spironolactone (ALDACTONE) 25 MG tablet Take 0.5 tablets (12.5 mg total) by mouth daily.  ? torsemide (DEMADEX) 20 MG tablet TAKE 3 TABLETS EVERY       MORNING AND 2 TABLETS EVERYEVENING  ? vitamin B-12 (CYANOCOBALAMIN) 1000 MCG tablet Take 1 tablet (1,000 mcg total) by  mouth daily.  ? zinc oxide 20 % ointment Apply 1 application topically as needed (buttocks irritation).  ? ?No current facility-administered medications for this visit. (Other)  ? ? ? ? ?REVIEW OF SYSTEMS: ?ROS   ?Negative for: Constitutional, Gastrointestinal, Neurological, Skin, Genitourinary, Musculoskeletal, HENT, Endocrine, Cardiovascular, Eyes, Respiratory, Psychiatric, Allergic/Imm, Heme/Lymph ?Last edited by Silvestre Moment on 03/28/2022  3:09 PM.  ?  ? ? ? ?ALLERGIES ?Allergies  ?Allergen Reactions  ? Benazepril Other (See Comments)  ?  Unknown reaction at age 22-65 - possibly dizziness  ? Ozempic (0.25 Or 0.5 Mg-Dose) [Semaglutide(0.25 Or 0.5mg -Dos)] Diarrhea  ?  Gi intollerance  ? Tape    ?  TOLERATES PAPER TAPE ONLY- due to thin skin. NOT ALLERGY PER PT  ? Angiotensin Receptor Blockers Other (See Comments)  ?  Hypotension reaction  ? Fe-Succ-C-Thre-B12-Des Stomach Other (See Comments)  ?  Unknown reaction  ? Sulfamethoxazole-Trimethoprim Other (See Comments)  ?  Unknown reaction  ? ? ?PAST MEDICAL HISTORY ?Past Medical History:  ?Diagnosis Date  ? Acute cystitis with hematuria 09/01/2020  ? ANXIETY 01/31/2009  ? Qualifier: Diagnosis of  By: Lovette Cliche CNA, Christy    ? Arthritis   ? "qwhere" (03/30/2016)  ? Bilateral carpal tunnel syndrome 02/13/2021  ? CAD (coronary artery disease) 05/2008  ? a. s/p CABG in 2009.  ? Cerebrovascular accident (CVA) due to embolism of right middle cerebral artery ([place])   ? CHF (congestive heart failure) ([place])   ? Chronic anticoagulation   ? Chronic kidney disease (CKD), stage III (moderate) (HCC)   ? Chronic pain syndrome   ? Diarrhea 01/10/2021  ? DM type 2 with diabetic peripheral neuropathy ([place])   ? Dysuria 11/26/2017  ? 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 Facial weakness, post-stroke   ? Gabapentin adverse reaction 09/01/2020  ? Gastrointestinal hemorrhage 03/26/2017  ? GERD (gastroesophageal reflux disease)   ? Hand tingling 06/02/2020  ? Hemiparesis ([place])   ? History of CVA with residual deficit 03/26/2017  ? Hyperlipidemia   ? Hypertension   ? Hypokalemia 11/20/2017  ? Intertrigo 08/05/2018  ? Ischemic cardiomyopathy   ? Longstanding persistent atrial fibrillation ([place])   ? a. Postoperative in 2009;  S/P transesophageal echocardiography-guided cardioversion, previously on Amiodarone and Coumadin. b. recurrent in April 2013 -> pt elected rate control strategy, initially Coumadin -> had stroke with subtherapeutic INR in 03/2016 and transitioned to Eliquis.  ? Macular degeneration   ? Melena 01/10/2021  ? Morbid obesity ([place]) 03/26/2017  ? OSA on CPAP   ? severe OSA with AHI 34/hr  ? Proliferative diabetic retinopathy of right eye ([place]) 04/17/2021  ? Found today at  delivery of PRP for what was thought to be severe NPDR OD  ? Rectal bleeding   ? Thrombocytopenia ([place])   ? ?Past Surgical History:  ?Procedure Laterality Date  ? APPENDECTOMY  1946  ? BACK SURGERY    ? CARPAL TUNNEL RELEASE Left 03/28/2021  ? Procedure: LEFT CARPAL TUNNEL RELEASE;  Surgeon: Daryll Brod, MD;  Location: [facility];  Service: Orthopedics;  Laterality: Left;  60 MIN  ? CARPAL TUNNEL RELEASE Right 09/15/2021  ? Procedure: RIGHT CARPAL TUNNEL RELEASE;  Surgeon: Daryll Brod, MD;  Location: [facility];  Service: Orthopedics;  Laterality: Right;  ? CATARACT EXTRACTION Right 2012  ? COLONOSCOPY WITH PROPOFOL Left 03/27/2017  ? Procedure: COLONOSCOPY WITH PROPOFOL;  Surgeon: Ronnette Juniper, MD;  Location: [facility];  Service: Gastroenterology;  Laterality: Left;  ? CORONARY ANGIOPLASTY WITH STENT PLACEMENT  2009  ? "put 2 stents in"  ? EXCISION METACARPAL MASS Left 03/28/2021  ? Procedure: EXCISION MASS LEFT THUMB;  Surgeon: Daryll Brod, MD;  Location: [facility];  Service: Orthopedics;  Laterality: Left;  ? EYE SURGERY    ? bilateral cataract  ? HERNIA REPAIR    ? IR LUMBAR [place] W/IMG GUIDE  10/08/2018  ? JOINT REPLACEMENT    ? TEE WITH CARDIOVERSION    ? Postoperative in 2009;  S/P transesophageal echocardiography-guided cardioversion,  ? TOTAL KNEE ARTHROPLASTY Right 1994  ? VENTRAL HERNIA REPAIR  10/1999  ? With mesh  ? ? ?FAMILY HISTORY ?Family History  ?Problem Relation Age of Onset  ? Clotting disorder Mother   ?     Cerebral hemorrhage  ? Other Mother   ?     cerebral hemorrhage  ? Emphysema Father   ?     COD  ? Lung cancer Brother   ? Heart disease Neg Hx   ? ? ?SOCIAL HISTORY ?Social History  ? ?Tobacco Use  ? Smoking status: Never  ? Smokeless tobacco: Never  ?Vaping Use  ? Vaping Use: Never used  ?Substance Use Topics  ? Alcohol use: No  ? Drug use: Never  ? ?  ? ?  ? ?OPHTHALMIC EXAM: ? ?Base Eye Exam   ? ? Visual Acuity (ETDRS)   ? ?   Right Left  ? Dist [place] 20/150 20/70  ? Dist ph [place] 20/70 -2 20/40 -1   ? ?  ?  ? ? Tonometry (Tonopen, 3:17 PM)   ? ?   Right Left  ? Pressure 17 13  ? ?  ?  ? ? Pupils   ? ?   Pupils APD  ? Right PERRL None  ? Left PERRL None  ? ?  ?  ? ? Visual Fields   ? ?   Left

## 2022-03-28 NOTE — Assessment & Plan Note (Signed)
No signs of recurrent hemorrhage ?

## 2022-04-03 ENCOUNTER — Ambulatory Visit (HOSPITAL_COMMUNITY)
Admission: RE | Admit: 2022-04-03 | Discharge: 2022-04-03 | Disposition: A | Discharge status: Home / Self Care | Payer: Medicare HMO | Source: Ambulatory Visit | Attending: Cardiology | Admitting: Cardiology

## 2022-04-03 ENCOUNTER — Encounter (HOSPITAL_COMMUNITY): Payer: Self-pay | Admitting: Cardiology

## 2022-04-03 VITALS — BP 108/72 | HR 68 | Wt 215.2 lb

## 2022-04-03 DIAGNOSIS — I255 Ischemic cardiomyopathy: Secondary | ICD-10-CM | POA: Insufficient documentation

## 2022-04-03 DIAGNOSIS — I2510 Atherosclerotic heart disease of native coronary artery without angina pectoris: Secondary | ICD-10-CM | POA: Diagnosis not present

## 2022-04-03 DIAGNOSIS — Z951 Presence of aortocoronary bypass graft: Secondary | ICD-10-CM | POA: Insufficient documentation

## 2022-04-03 DIAGNOSIS — I4819 Other persistent atrial fibrillation: Secondary | ICD-10-CM

## 2022-04-03 DIAGNOSIS — N183 Chronic kidney disease, stage 3 unspecified: Secondary | ICD-10-CM | POA: Diagnosis not present

## 2022-04-03 DIAGNOSIS — Z79899 Other long term (current) drug therapy: Secondary | ICD-10-CM | POA: Diagnosis not present

## 2022-04-03 DIAGNOSIS — I482 Chronic atrial fibrillation, unspecified: Secondary | ICD-10-CM | POA: Diagnosis not present

## 2022-04-03 DIAGNOSIS — I5022 Chronic systolic (congestive) heart failure: Secondary | ICD-10-CM

## 2022-04-03 DIAGNOSIS — Z7984 Long term (current) use of oral hypoglycemic drugs: Secondary | ICD-10-CM | POA: Insufficient documentation

## 2022-04-03 DIAGNOSIS — Z7901 Long term (current) use of anticoagulants: Secondary | ICD-10-CM | POA: Insufficient documentation

## 2022-04-03 DIAGNOSIS — I130 Hypertensive heart and chronic kidney disease with heart failure and stage 1 through stage 4 chronic kidney disease, or unspecified chronic kidney disease: Secondary | ICD-10-CM | POA: Insufficient documentation

## 2022-04-03 DIAGNOSIS — Z8673 Personal history of transient ischemic attack (TIA), and cerebral infarction without residual deficits: Secondary | ICD-10-CM | POA: Diagnosis not present

## 2022-04-03 DIAGNOSIS — G4733 Obstructive sleep apnea (adult) (pediatric): Secondary | ICD-10-CM | POA: Diagnosis not present

## 2022-04-03 DIAGNOSIS — E1122 Type 2 diabetes mellitus with diabetic chronic kidney disease: Secondary | ICD-10-CM | POA: Diagnosis not present

## 2022-04-03 LAB — BASIC METABOLIC PANEL
Anion gap: 8 (ref 5–15)
BUN: 77 mg/dL — ABNORMAL HIGH (ref 8–23)
CO2: 28 mmol/L (ref 22–32)
Calcium: 10.1 mg/dL (ref 8.9–10.3)
Chloride: 99 mmol/L (ref 98–111)
Creatinine, Ser: 1.79 mg/dL — ABNORMAL HIGH (ref 0.44–1.00)
GFR, Estimated: 28 mL/min — ABNORMAL LOW (ref >60)
Glucose, Bld: 165 mg/dL — ABNORMAL HIGH (ref 70–99)
Potassium: 4.5 mmol/L (ref 3.5–5.1)
Sodium: 135 mmol/L (ref 135–145)

## 2022-04-03 LAB — BRAIN NATRIURETIC PEPTIDE: B Natriuretic Peptide: 85.3 pg/mL (ref 0.0–100.0)

## 2022-04-03 MED ORDER — TORSEMIDE 20 MG PO TABS: 60.0000 mg | ORAL_TABLET | Freq: Two times a day (BID) | ORAL | 3 refills | Status: DC

## 2022-04-03 NOTE — Progress Notes (Signed)
ID:  CELIE DESROCHERS, DOB 1941-09-30, MRN [id number]   Provider location: [facility] Type of Visit: Established patient   PCP:  Alcus Dad, MD  Cardiologist:  Fransico Him, MD Primary HF: Dr. Aundra Dubin   History of Present Illness: [name] is a 81 y.o. female who has a history of CAD status post CABG 2009, ischemic cardiomyopathy with chronic systolic CHF, persistent atrial fibrillation with stroke in 03/2016 when INR was subtherapeutic, stage III CKD, morbid obesity, pulmonary hypertension, diabetes, GI bleed 03/2017, and severe sleep apnea.    Seen in [facility] clinics 09/10/18 and 09/24/18, both times with volume overload and marked peripheral edema with skin breakdown. Torsemide increased at each visit. Echo in 7/19 showed EF 45-50% with mild RV dilation/mild RV systolic dysfunction and D-shaped interventricular septum.    AKI earlier in 7/20 in setting of excessive torsemide, she had increased dose to 60 qam/40 qpm on her own.  This was cut back to 40 mg daily and losartan and spironolactone were stopped.   Echo in 9/20 showed EF 45% with mildly decreased RV systolic function, PASP 46 mmHg with dilated IVC.  PYP scan in 10/21 was negative.   Echo in 4/22 showed EF 45% with diffuse hypokinesis, mildly decreased RV systolic function, PASP 47, IVC dilated, moderate TR, mild MR.   She returns today for followup of CHF.  Main complaint is right shoulder pain, thinks it is her rotator cuff.  Weight is up 11 lbs.  She uses her walker.  She can walk a short distance without dyspnea.  She has chronic orthopnea, keeps head of bed elevated.  No chest pain.  No lightheadedness.    REDS clip 38%  Labs (12/18): K 3.9, Creatinine 1.65 Labs (5/19): K 4.1, creatinine 1.7 Labs (6/19): hgb 8.2 Labs (8/19): K 4.3, creatinine 1.73 Labs (9/19): K 4, creatinine 1.56 Labs (11/19): K 4.7, creatinine 2.04 Labs (12/19): creatinine 1.44 Labs (7/20): K 5, creatinine 2.49 =>  1.86, Hgb 11.1 Labs (8/20): K 3.9, creatinine 1.79 Labs (9/20): LDL 50 Labs (11/20): K 3.8, creatinine 2.01 Labs (2/21): K 4, creatinine 1.9 Labs (7/21): K 4.2, creatinine 1.84, hgb 11.2 Labs (9/21): myeloma panel negative, urine immunofixation negative, LDL 50, K 4.6, creatinine 1.76 Labs (3/22): hgb 13.4, K 4.3, creatinine 1.86 Labs (5/22): K 4.5, creatinine 1.98 Labs (12/22): K 4.6, creatinine 1.71, LDL 47  Review of systems complete and found to be negative unless listed in HPI.    Past Medical History 1. Chronic systolic CHF: Ischemic cardiomyopathy with prominent RV failure.   - Echo (11/18): LVEF 35-40%, severe RV dilation, severe RAE, severe TR.  - Echo (7/19): EF 45-50%, diffuse hypokinesis, mild RV dilation with mildly decreased RV systolic function, D-shaped interventricular septum suggestive of RV pressure/volume overload, mild MR, moderate TR, PASP 49 mmhg.  - Echo (9/20): EF 45%, mild LV dilation, mild RV dilation with mildly decreased systolic function, PASP 46 mmHg, IVC dilated.  - PYP scan (10/21): Negative.  - Echo (4/22): EF 45% with diffuse hypokinesis, mildly decreased RV systolic function, PASP 47, IVC dilated, moderate TR, mild MR. 2. CAD: CABG 2009. No angiography since that time.   3. HTN 4. Atrial fibrillation: Chronic since 2013.  5. CKD stage 3 6. Morbid obesity 7. OSA 8. Chronic venous stasis with RLE wound/[place] 9. CVA 5/18.   Current Outpatient Medications  Medication Sig Dispense Refill   acetaminophen (TYLENOL 8 HOUR) 650 MG CR tablet  Take 1 tablet (650 mg total) by mouth every 8 (eight) hours as needed for pain. 30 tablet 0   atorvastatin (LIPITOR) 40 MG tablet Take 1 tablet (40 mg total) by mouth daily. 100 tablet 2   Blood Glucose Monitoring Suppl (ONE TOUCH ULTRA 2) w/Device KIT USE TO CHECK BLOOD SUGAR UPTO 3 TIMES A DAY 1 kit 0   carvedilol (COREG) 6.25 MG tablet TAKE 1 TABLET TWICE A DAY  WITH MEALS 180 tablet 3   Cholecalciferol 25 MCG  (1000 UT) tablet Take by mouth.     docusate sodium (COLACE) 100 MG capsule Take 100 mg by mouth daily as needed for moderate constipation.     DULoxetine (CYMBALTA) 20 MG capsule TAKE 1 CAPSULE DAILY 90 capsule 3   ELIQUIS 5 MG TABS tablet TAKE 1 TABLET TWICE A DAY 180 tablet 3   empagliflozin (JARDIANCE) 10 MG TABS tablet Take 1 tablet (10 mg total) by mouth daily before breakfast. 100 tablet 2   ferrous sulfate 325 (65 FE) MG tablet TAKE 1 TABLET BY MOUTH EVERY DAY WITH BREAKFAST 90 tablet 1   glucose blood (ONE TOUCH ULTRA TEST) test strip Use three times a day 100 each 3   Lancets (ONETOUCH DELICA PLUS [id number]) MISC 1 application by Does not apply route daily. Please use to check blood sugar up to three times daily. 100 each 6   lidocaine (XYLOCAINE) 5 % ointment Apply 1 application topically as needed. 35.44 g 0   metolazone (ZAROXOLYN) 2.5 MG tablet Take only as directed by CHF clinic (Patient taking differently: Take 2.5 mg by mouth as needed. Take only as directed by CHF clinic) 10 tablet 0   Neomycin-Bacitracin Zn-Polymyx 3.5-400-10000 OINT      nitroGLYCERIN (NITROSTAT) 0.4 MG SL tablet PLACE 1 TABLET (0.4 MG TOTAL) UNDER THE TONGUE EVERY 5 (FIVE) MINUTES AS NEEDED FOR CHEST PAIN (UP TO 3 DOSES). 15 tablet 5   potassium chloride SA (KLOR-CON M20) 20 MEQ tablet Take 3 tablets (60 mEq total) by mouth 2 (two) times daily. 540 tablet 3   rOPINIRole (REQUIP) 0.5 MG tablet TAKE 2 TABLETS AT BEDTIME 180 tablet 3   silver sulfADIAZINE (SILVADENE) 1 % cream      vitamin B-12 (CYANOCOBALAMIN) 1000 MCG tablet Take 1 tablet (1,000 mcg total) by mouth daily. 90 tablet 1   zinc oxide 20 % ointment Apply 1 application topically as needed (buttocks irritation). 425 g 1   spironolactone (ALDACTONE) 25 MG tablet Take 0.5 tablets (12.5 mg total) by mouth daily. 50 tablet 2   torsemide (DEMADEX) 20 MG tablet Take 3 tablets (60 mg total) by mouth 2 (two) times daily. 450 tablet 3   No current  facility-administered medications for this encounter.   Allergies  Allergen Reactions   Benazepril Other (See Comments)    Unknown reaction at age 35-65 - possibly dizziness   Ozempic (0.25 Or 0.5 Mg-Dose) [Semaglutide(0.25 Or 0.5mg -Dos)] Diarrhea    Gi intollerance   Tape     TOLERATES PAPER TAPE ONLY- due to thin skin. NOT ALLERGY PER PT   Angiotensin Receptor Blockers Other (See Comments)    Hypotension reaction   Fe-Succ-C-Thre-B12-Des Stomach Other (See Comments)    Unknown reaction   Sulfamethoxazole-Trimethoprim Other (See Comments)    Unknown reaction   Social History   Socioeconomic History   Marital status: Widowed    Spouse name: Not on file   Number of children: 2   Years of education: 12   Highest  education level: Not on file  Occupational History    Comment: retired  Tobacco Use   Smoking status: Never   Smokeless tobacco: Never  Vaping Use   Vaping Use: Never used  Substance and Sexual Activity   Alcohol use: No   Drug use: Never   Sexual activity: Not Currently  Other Topics Concern   Not on file  Social History Narrative   Widowed   01/27/21 Lives with son,  in [place]   2 children   Not routinely exercising   Social Determinants of Health   Financial Resource Strain: Not on file  Food Insecurity: Not on file  Transportation Needs: Not on file  Physical Activity: Not on file  Stress: Not on file  Social Connections: Not on file  Intimate Partner Violence: Not on file   Family History  Problem Relation Age of Onset   Clotting disorder Mother        Cerebral hemorrhage   Other Mother        cerebral hemorrhage   Emphysema Father        COD   Lung cancer Brother    Heart disease Neg Hx    Vitals:   04/03/22 0923  BP: 108/72  Pulse: 68  SpO2: 99%  Weight: 97.6 kg (215 lb 3.2 oz)     Wt Readings from Last 3 Encounters:  04/03/22 97.6 kg (215 lb 3.2 oz)  02/27/22 96.9 kg (213 lb 9.6 oz)  12/15/21 95.8 kg (211 lb 3.2 oz)     Exam:   General: NAD Neck: JVP 8-9 cm, no thyromegaly or thyroid nodule.  Lungs: Clear to auscultation bilaterally with normal respiratory effort. CV: Nondisplaced PMI.  Heart irregular S1/S2, no S3/S4, no murmur.  Trace ankle edema.  No carotid bruit.  Normal pedal pulses.  Abdomen: Soft, nontender, no hepatosplenomegaly, no distention.  Skin: Intact without lesions or rashes.  Neurologic: Alert and oriented x 3.  Psych: Normal affect. Extremities: No clubbing or cyanosis.  HEENT: Normal.   ASSESSMENT & PLAN:  1. Chronic systolic CHF with prominent RV failure: Ischemic cardiomyopathy.  Echo in 7/19 showed LV EF 45-50% (improved) with mildly dilated/mildly dysfunction RV.  D-shaped septum suggested RV pressure/volume overload.  Echo in 9/20 showed EF 45%, mildly dysfunctional RV, PASP 46 mmHg with dilated IVC.  PYP scan negative and myeloma workup negative, no evidence for cardiac amyloidosis.  Echo in 4/22 showed EF 45% with diffuse hypokinesis, mildly decreased RV systolic function, PASP 47, IVC dilated, moderate TR, mild MR.  Weight is up, she is mildly volume overloaded by exam and REDS clip. NYHA class III symptoms.  - Continue Jardiance 10 mg daily.  - Increase torsemide to 60 mg bid with BMET/BNP today and BMET in 10 days.    - Continue Coreg 6.25 mg bid.   - Continue spironolactone 12.5 mg daily.   - Cardiomems would be option, discuss next appt.  2. CAD s/p CABG: No chest pain.  - Continue atorvastatin 40 mg daily. Good lipids in 12/22.     - No ASA with stable CAD on Eliquis.  3. Atrial fibrillation: Chronic. Given long-term atrial fibrillation, she is unlikely to successfully cardiovert.  - Continue Eliquis.   4. CKD stage 3: BMET today.   5. H/o CVA: Continue Eliquis.   6. OSA: She is using CPAP.  7. HTN: BP controlled.    Followup 3 months with APP.   Signed, Loralie Champagne, MD  04/03/2022  [facility]  [place] [address] [phone] (office) [phone] (fax)

## 2022-04-03 NOTE — Patient Instructions (Addendum)
Increase Torsemide to 60 mg Twice daily  Labs done today, your results will be available in MyChart, we will contact you for abnormal readings.  Your physician recommends that you schedule a follow-up appointment in: 3 months.  If you have any questions or concerns before your next appointment please send [place] a message through [place] or call our office at [phone].    TO LEAVE A MESSAGE FOR THE NURSE SELECT OPTION 2, PLEASE LEAVE A MESSAGE INCLUDING: YOUR NAME DATE OF BIRTH CALL BACK NUMBER REASON FOR CALL**this is important as we prioritize the call backs  YOU WILL RECEIVE A CALL BACK THE SAME DAY AS LONG AS YOU CALL BEFORE 4:00 PM  At the [facility], you and your health needs are our priority. As part of our continuing mission to provide you with exceptional heart care, we have created designated Provider Care Teams. These Care Teams include your primary Cardiologist (physician) and Advanced Practice Providers (APPs- Physician Assistants and Nurse Practitioners) who all work together to provide you with the care you need, when you need it.   You may see any of the following providers on your designated Care Team at your next follow up: Dr Glori Bickers Dr Haynes Kerns, NP Lyda Jester, [place] [facility] [place], [place] Audry Riles, PharmD   Please be sure to bring in all your medications bottles to every appointment.

## 2022-04-04 DIAGNOSIS — S46011A Strain of muscle(s) and tendon(s) of the rotator cuff of right shoulder, initial encounter: Secondary | ICD-10-CM | POA: Diagnosis not present

## 2022-05-01 ENCOUNTER — Other Ambulatory Visit (HOSPITAL_COMMUNITY): Payer: Self-pay | Admitting: Cardiology

## 2022-05-09 ENCOUNTER — Other Ambulatory Visit: Payer: Self-pay | Admitting: Orthopedic Surgery

## 2022-05-09 DIAGNOSIS — S46011D Strain of muscle(s) and tendon(s) of the rotator cuff of right shoulder, subsequent encounter: Secondary | ICD-10-CM | POA: Diagnosis not present

## 2022-05-09 DIAGNOSIS — Z01818 Encounter for other preprocedural examination: Secondary | ICD-10-CM

## 2022-05-11 ENCOUNTER — Telehealth: Payer: Self-pay | Admitting: *Deleted

## 2022-05-11 NOTE — Telephone Encounter (Signed)
Pt agreeable to plan of care for tele visit for pre op clearance. Med rec and consent are done.

## 2022-05-11 NOTE — Telephone Encounter (Signed)
   Pre-operative Risk Assessment    Patient Name: [name]  DOB: 08/29/1941 MRN: [id number]      Request for Surgical Clearance    Procedure:   RIGHT REVERSE TOTAL SHOULDER REPLACEMENT  Date of Surgery:  Clearance TBD                                 Surgeon:  DR. Marchia Bond Surgeon's Group or Practice Name:  [facility] Phone number:  [phone] Fax number:  [phone]   Type of Clearance Requested:   - Medical  - Pharmacy:  Hold Apixaban (Eliquis) NOT INDICATED   Type of Anesthesia:  General    Additional requests/questions:    [name]   05/11/2022, 2:18 PM

## 2022-05-11 NOTE — Telephone Encounter (Signed)
Pt agreeable to plan of care for tele visit for pre op clearance. Med rec and consent are done.     Patient Consent for Virtual Visit        [name] has provided verbal consent on 05/11/2022 for a virtual visit (video or telephone).   CONSENT FOR VIRTUAL VISIT FOR:  [name]  By participating in this virtual visit I agree to the following:  I hereby voluntarily request, consent and authorize [facility] and its employed or contracted physicians, physician assistants, nurse practitioners or other licensed health care professionals (the Practitioner), to provide me with telemedicine health care services (the "Services") as deemed necessary by the treating Practitioner. I acknowledge and consent to receive the Services by the Practitioner via telemedicine. I understand that the telemedicine visit will involve communicating with the Practitioner through live audiovisual communication technology and the disclosure of certain medical information by electronic transmission. I acknowledge that I have been given the opportunity to request an in-person assessment or other available alternative prior to the telemedicine visit and am voluntarily participating in the telemedicine visit.  I understand that I have the right to withhold or withdraw my consent to the use of telemedicine in the course of my care at any time, without affecting my right to future care or treatment, and that the Practitioner or I may terminate the telemedicine visit at any time. I understand that I have the right to inspect all information obtained and/or recorded in the course of the telemedicine visit and may receive copies of available information for a reasonable fee.  I understand that some of the potential risks of receiving the Services via telemedicine include:  Delay or interruption in medical evaluation due to technological equipment failure or disruption; Information transmitted may not be sufficient (e.g.  poor resolution of images) to allow for appropriate medical decision making by the Practitioner; and/or  In rare instances, security protocols could fail, causing a breach of personal health information.  Furthermore, I acknowledge that it is my responsibility to provide information about my medical history, conditions and care that is complete and accurate to the best of my ability. I acknowledge that Practitioner's advice, recommendations, and/or decision may be based on factors not within their control, such as incomplete or inaccurate data provided by me or distortions of diagnostic images or specimens that may result from electronic transmissions. I understand that the practice of medicine is not an exact science and that Practitioner makes no warranties or guarantees regarding treatment outcomes. I acknowledge that a copy of this consent can be made available to me via my patient portal ([place]), or I can request a printed copy by calling the office of [place].    I understand that my insurance will be billed for this visit.   I have read or had this consent read to me. I understand the contents of this consent, which adequately explains the benefits and risks of the Services being provided via telemedicine.  I have been provided ample opportunity to ask questions regarding this consent and the Services and have had my questions answered to my satisfaction. I give my informed consent for the services to be provided through the use of telemedicine in my medical care

## 2022-05-11 NOTE — Telephone Encounter (Signed)
   Name: EMANUELA RUNNION  DOB: 1941/07/31  MRN: [id number]  Primary Cardiologist: Fransico Him, MD  Chart reviewed as part of pre-operative protocol coverage. Because of Dezyre E Leeman's past medical history and time since last visit, she will require a follow-up tele visit in order to better assess preoperative cardiovascular risk.  Pre-op covering staff: - Please schedule appointment and call patient to inform them. If patient already had an upcoming appointment within acceptable timeframe, please add "pre-op clearance" to the appointment notes so provider is aware. - Please contact requesting surgeon's office via preferred method (i.e, phone, fax) to inform them of need for appointment prior to surgery.  This message will also be routed to pharmacy pool for input on holding Eliquis as requested below so that this information is available to the clearing provider at time of patient's appointment.   Elgie Collard, PA-C  05/11/2022, 2:49 PM

## 2022-05-16 ENCOUNTER — Other Ambulatory Visit (HOSPITAL_COMMUNITY): Payer: Self-pay | Admitting: *Deleted

## 2022-05-16 ENCOUNTER — Other Ambulatory Visit (HOSPITAL_COMMUNITY): Payer: Self-pay

## 2022-05-16 MED ORDER — SPIRONOLACTONE 25 MG PO TABS: 12.5000 mg | ORAL_TABLET | Freq: Every day | ORAL | 1 refills | Status: DC

## 2022-05-16 MED ORDER — APIXABAN 2.5 MG PO TABS: 2.5000 mg | ORAL_TABLET | Freq: Two times a day (BID) | ORAL | 3 refills | Status: DC

## 2022-05-16 NOTE — Telephone Encounter (Signed)
New script sent

## 2022-05-16 NOTE — Telephone Encounter (Signed)
Can hold Eliquis 2 days prior to surgery and day of.  Also need to decrease Eliquis to 2.5 mg bid.

## 2022-05-16 NOTE — Telephone Encounter (Signed)
**  This guidance is not considered finalized until pre-operative APP has relayed final recommendations.**  Patient with diagnosis of afib on Eliquis for anticoagulation.    Procedure: right reverse total shoulder replacement Date of procedure: TBD  CHA2DS2-VASc Score = 9  This indicates a 12.2% annual risk of stroke. The patient's score is based upon: CHF History: 1 HTN History: 1 Diabetes History: 1 Stroke History: 2 Vascular Disease History: 1 Age Score: 2 Gender Score: 1   CrCl 65m/min using adjusted body weight due to obesity Platelet count 137K  Of note, pt is taking the incorrect dose of Eliquis. She is > 80 and her SCr has consistently been > 1.5, her dose should be reduced to 2.'5mg'$  BID. Refills have been managed by CHF clinic - will forward to their team for medication dose change.  Anticoag clearance will also require MD input as shoulder replacement typically requires 3 day Eliquis hold but pt is at elevated CV risk off of anticoagulation given CHADS2VASc score of 9.

## 2022-05-17 IMAGING — NM NM SCAN TUMOR LOCALIZE WITH SPECT
2 series · 12 of 12 positions shown · non-contrast
Comparison: none

CLINICAL DATA: HEART FAILURE. CONCERN FOR CARDIAC AMYLOIDOSIS.

EXAM:
NUCLEAR MEDICINE TUMOR LOCALIZATION. PYP CARDIAC AMYLOIDOSIS SCAN
WITH SPECT
TECHNIQUE: Following intravenous administration of radiopharmaceutical,
anterior planar images of the chest were obtained. Regions of
interest were placed on the heart and contralateral chest wall for
quantitative assessment. Additional SPECT imaging of the chest was
obtained.
RADIOPHARMACEUTICALS:  21.9 mCi TECHNETIUM 99 PYROPHOSPHATE

[Series 1: spect - (id)_(id) tra_tra · 4.1mm · 4.14mm/px · 6 of 128 frames shown]
[frame 11/128]
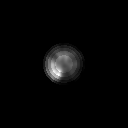
[frame 32/128]
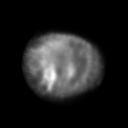
[frame 54/128]
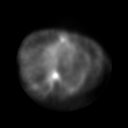
[frame 75/128]
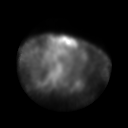
[frame 96/128]
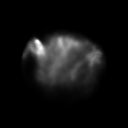
[frame 118/128]
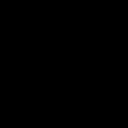

[Series 1: spect - (id)_(id) cor_cor · 4.1mm · 4.14mm/px · 6 of 128 frames shown]
[frame 11/128]
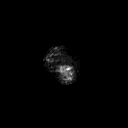
[frame 32/128]
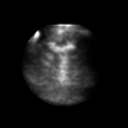
[frame 54/128]
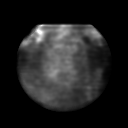
[frame 75/128]
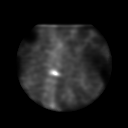
[frame 96/128]
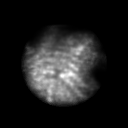
[frame 118/128]
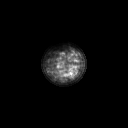

[12 of 12 positions shown; findings below may reference images not displayed]

FINDINGS: Planar Visual assessment:

Anterior planar imaging demonstrates radiotracer uptake within the
heart less than than uptake within the adjacent ribs (Grade 1).

Quantitative assessment :

Quantitative assessment of the cardiac uptake compared to the
contralateral chest wall is equal to (H/CL = 0.83).

SPECT assessment: SPECT imaging of the chest demonstrates no clear
radiotracer accumulation within the LEFT ventricle.

Other: Incidental uptake is noted within the proximal right humerus
as well as within the mid-thoracic spine.
IMPRESSION: Visual and quantitative assessment (grade 1, H/CLL equal 0.8) are
not suggestive of transthyretin amyloidosis.

Incidental tracer uptake within the thoracic spine is identified
suggesting an underlying bone abnormality. This may reflect thoracic
compression deformity, inflammation or infection or underlying bone
lesion. As an initial workup recommend further evaluation with plain
film radiographs of the thoracic spine. Alternatively, CT or MRI may
be obtained for more definitive characterization.

## 2022-05-18 ENCOUNTER — Ambulatory Visit (INDEPENDENT_AMBULATORY_CARE_PROVIDER_SITE_OTHER): Payer: Medicare HMO | Admitting: Nurse Practitioner

## 2022-05-18 DIAGNOSIS — Z01810 Encounter for preprocedural cardiovascular examination: Secondary | ICD-10-CM

## 2022-05-18 NOTE — Progress Notes (Signed)
Virtual Visit via Telephone Note   Because of [name]'s co-morbid illnesses, she is at least at moderate risk for complications without adequate follow up.  This format is felt to be most appropriate for this patient at this time.  The patient did not have access to video technology/had technical difficulties with video requiring transitioning to audio format only (telephone).  All issues noted in this document were discussed and addressed.  No physical exam could be performed with this format.  Please refer to the patient's chart for her consent to telehealth for [name].  Evaluation Performed:  Preoperative cardiovascular risk assessment _____________   Date:  05/18/2022   Patient ID:  [name], DOB 1941-03-03, MRN [id number] Patient Location:  Home Provider location:   Office  Primary Care Provider:  Alcus Dad, MD Primary Cardiologist:  Fransico Him, MD  Chief Complaint / Patient Profile   81 y.o. y/o female with a h/o CAD s/p CABG, chronic systolic heart failure, ICM, permanent atrial fibrillation, CVA, hypertension, hyperlipidemia, OSA, CKD stage III, and type 2 diabetes who is pending right reverse total shoulder replacement, date TBD with Dr. Marchia Bond of [place] and presents today for telephonic preoperative cardiovascular risk assessment.  Past Medical History    Past Medical History:  Diagnosis Date   Acute cystitis with hematuria 09/01/2020   ANXIETY 01/31/2009   Qualifier: Diagnosis of  By: Lovette Cliche, CNA, Christy     Arthritis    "qwhere" (03/30/2016)   Bilateral carpal tunnel syndrome 02/13/2021   CAD (coronary artery disease) 05/2008   a. s/p CABG in 2009.   Cerebrovascular accident (CVA) due to embolism of right middle cerebral artery (HCC)    CHF (congestive heart failure) (HCC)    Chronic anticoagulation    Chronic kidney disease (CKD), stage III (moderate) (HCC)    Chronic pain syndrome    Diarrhea 01/10/2021   DM  type 2 with diabetic peripheral neuropathy ([place])    Dysuria 08/04/3006   Enteric duplication cyst 62/26/3335   Facial weakness, post-stroke    Gabapentin adverse reaction 09/01/2020   Gastrointestinal hemorrhage 03/26/2017   GERD (gastroesophageal reflux disease)    Hand tingling 06/02/2020   Hemiparesis ([place])    History of CVA with residual deficit 03/26/2017   Hyperlipidemia    Hypertension    Hypokalemia 11/20/2017   Intertrigo 08/05/2018   Ischemic cardiomyopathy    Longstanding persistent atrial fibrillation ([place])    a. Postoperative in 2009;  S/P transesophageal echocardiography-guided cardioversion, previously on Amiodarone and Coumadin. b. recurrent in April 2013 -> pt elected rate control strategy, initially Coumadin -> had stroke with subtherapeutic INR in 03/2016 and transitioned to Eliquis.   Macular degeneration    Melena 01/10/2021   Morbid obesity ([place]) 03/26/2017   OSA on CPAP    severe OSA with AHI 34/hr   Proliferative diabetic retinopathy of right eye ([place]) 04/17/2021   Found today at delivery of PRP for what was thought to be severe NPDR OD   Rectal bleeding    Thrombocytopenia ([place])    Past Surgical History:  Procedure Laterality Date   APPENDECTOMY  1946   BACK SURGERY     CARPAL TUNNEL RELEASE Left 03/28/2021   Procedure: LEFT CARPAL TUNNEL RELEASE;  Surgeon: Daryll Brod, MD;  Location: [facility];  Service: Orthopedics;  Laterality: Left;  60 MIN   CARPAL TUNNEL RELEASE Right 09/15/2021   Procedure: RIGHT CARPAL TUNNEL RELEASE;  Surgeon: Daryll Brod, MD;  Location:  [facility];  Service: Orthopedics;  Laterality: Right;   CATARACT EXTRACTION Right 2012   COLONOSCOPY WITH PROPOFOL Left 03/27/2017   Procedure: COLONOSCOPY WITH PROPOFOL;  Surgeon: Ronnette Juniper, MD;  Location: [facility];  Service: Gastroenterology;  Laterality: Left;   CORONARY ANGIOPLASTY WITH STENT PLACEMENT  2009   "put 2 stents in"   EXCISION METACARPAL MASS Left 03/28/2021   Procedure: EXCISION  MASS LEFT THUMB;  Surgeon: Daryll Brod, MD;  Location: [facility];  Service: Orthopedics;  Laterality: Left;   EYE SURGERY     bilateral cataract   HERNIA REPAIR     IR LUMBAR DISC ASPIRATION W/IMG GUIDE  10/08/2018   JOINT REPLACEMENT     TEE WITH CARDIOVERSION     Postoperative in 2009;  S/P transesophageal echocardiography-guided cardioversion,   TOTAL KNEE ARTHROPLASTY Right 1994   VENTRAL HERNIA REPAIR  10/1999   With mesh    Allergies  Allergies  Allergen Reactions   Benazepril Other (See Comments)    Unknown reaction at age 62-65 - possibly dizziness   Ozempic (0.25 Or 0.5 Mg-Dose) [Semaglutide(0.25 Or 0.5mg -Dos)] Diarrhea    Gi intollerance   Tape     TOLERATES PAPER TAPE ONLY- due to thin skin. NOT ALLERGY PER PT   Angiotensin Receptor Blockers Other (See Comments)    Hypotension reaction   Fe-Succ-C-Thre-B12-Des Stomach Other (See Comments)    Unknown reaction   Sulfamethoxazole-Trimethoprim Other (See Comments)    Unknown reaction    History of Present Illness    [name] is a 81 y.o. female who presents via audio/video conferencing for a telehealth visit today.  Pt was last seen in cardiology clinic on 04/03/2022 by Dr. Aundra Dubin.  At that time [name] was doing well.  The patient is now pending procedure as outlined above. Since her last visit, she has been stable from a cardiac standpoint. Her baseline activity is limited by orthopedic issues. She denies chest pain, palpitations, dyspnea, pnd, orthopnea, n, v, dizziness, syncope, edema, weight gain, or early satiety. All other systems reviewed and are otherwise negative except as noted above.   Home Medications    Prior to Admission medications   Medication Sig Start Date End Date Taking? Authorizing Provider  acetaminophen (TYLENOL 8 HOUR) 650 MG CR tablet Take 1 tablet (650 mg total) by mouth every 8 (eight) hours as needed for pain. 11/03/19   Bonnita Hollow, MD  apixaban (ELIQUIS) 2.5 MG TABS tablet  Take 1 tablet (2.5 mg total) by mouth 2 (two) times daily. 05/16/22   Larey Dresser, MD  atorvastatin (LIPITOR) 40 MG tablet TAKE 1 TABLET DAILY 05/02/22   Larey Dresser, MD  Blood Glucose Monitoring Suppl (ONE TOUCH ULTRA 2) w/Device KIT USE TO CHECK BLOOD SUGAR UPTO 3 TIMES A DAY 02/01/21   Alcus Dad, MD  carvedilol (COREG) 6.25 MG tablet TAKE 1 TABLET TWICE A DAY  WITH MEALS 08/30/21   Larey Dresser, MD  Cholecalciferol 25 MCG (1000 UT) tablet Take by mouth.    [provider]  docusate sodium (COLACE) 100 MG capsule Take 100 mg by mouth daily as needed for moderate constipation.    [provider]  DULoxetine (CYMBALTA) 20 MG capsule TAKE 1 CAPSULE DAILY 08/30/21   Alcus Dad, MD  ferrous sulfate 325 (65 FE) MG tablet TAKE 1 TABLET BY MOUTH EVERY DAY WITH BREAKFAST 09/16/20   Alcus Dad, MD  glucose blood (ONE TOUCH ULTRA TEST) test strip Use three times  a day 09/15/20   Alcus Dad, MD  JARDIANCE 10 MG TABS tablet TAKE 1 TABLET DAILY BEFORE BREAKFAST 05/02/22   Larey Dresser, MD  Lancets [facility] DELICA PLUS [id number]) [place] 1 application by Does not apply route daily. Please use to check blood sugar up to three times daily. 09/15/20   Alcus Dad, MD  lidocaine (XYLOCAINE) 5 % ointment Apply 1 application topically as needed. 09/01/21   Ezequiel [place], MD  metolazone (ZAROXOLYN) 2.5 MG tablet Take only as directed by CHF clinic Patient taking differently: Take 2.5 mg by mouth as needed. Take only as directed by CHF clinic 05/22/21   Larey Dresser, MD  Neomycin-Bacitracin Zn-Polymyx 3.5-400-10000 [place]     [provider]  nitroGLYCERIN (NITROSTAT) 0.4 MG SL tablet PLACE 1 TABLET (0.4 MG TOTAL) UNDER THE TONGUE EVERY 5 (FIVE) MINUTES AS NEEDED FOR CHEST PAIN (UP TO 3 DOSES). 03/01/21   Larey Dresser, MD  potassium chloride SA (KLOR-CON M20) 20 MEQ tablet Take 3 tablets (60 mEq total) by mouth 2 (two) times daily. 10/31/21   Larey Dresser, MD  rOPINIRole (REQUIP) 0.5 MG tablet TAKE 2 TABLETS AT BEDTIME 08/30/21   Alcus Dad, MD  silver sulfADIAZINE (SILVADENE) 1 % cream     [provider]  spironolactone (ALDACTONE) 25 MG tablet Take 0.5 tablets (12.5 mg total) by mouth daily. 05/16/22 12/02/22  Larey Dresser, MD  torsemide (DEMADEX) 20 MG tablet Take 3 tablets (60 mg total) by mouth 2 (two) times daily. 04/03/22   Larey Dresser, MD  vitamin B-12 (CYANOCOBALAMIN) 1000 MCG tablet Take 1 tablet (1,000 mcg total) by mouth daily. 09/16/20   Alcus Dad, MD  zinc oxide 20 % ointment Apply 1 application topically as needed (buttocks irritation). 10/12/21   Alcus Dad, MD    Physical Exam    Vital Signs:  [name] does not have vital signs available for review today.  Given telephonic nature of communication, physical exam is limited. AAOx3. NAD. Normal affect.  Speech and respirations are unlabored.  Accessory Clinical Findings    None  Assessment & Plan    1.  Preoperative Cardiovascular Risk Assessment:  According to the Revised Cardiac Risk Index (RCRI), her Perioperative Risk of Major Cardiac Event is (%): 11. Her Functional Capacity in METs is: 3.63 according to the Duke Activity Status Index (DASI). Her activity is limited at baseline due to severe orthopedic issues, she is unable to complete 4 METS at baseline. Per Dr. Aundra Dubin, who has followed the patient most recently in the advanced heart failure clinic, patient would be at acceptable risk for the planned procedure without further cardiovascular testing.   Per Dr. Aundra Dubin, patient may hold Eliquis for 2 days prior to procedure.  Please resume Eliquis as soon as possible postprocedure, at the discretion of the surgeon.  Of note, Eliquis dose has changed to 2.5 mg twice daily.  Patient is aware.  A copy of this note will be routed to requesting surgeon.  Time:   Today, I have spent 9 minutes with the patient with telehealth  technology discussing medical history, symptoms, and management plan.     Lenna Sciara, NP  05/18/2022, 3:29 PM

## 2022-05-22 ENCOUNTER — Telehealth: Payer: Self-pay

## 2022-05-22 NOTE — Telephone Encounter (Signed)
Patient calls nurse line requesting medical clearance for upcoming surgery for rotator cuff. Patient is requesting that Dr. Rock Nephew call and speak with surgeon at [facility].   Advised patient that we should schedule an appointment for medical clearance. Patient has already been cleared by cardiology, however, states they need PCP clearance as well.   Scheduled for Friday morning. Patient will call back to the office if she is unable to obtain transportation.   Talbot Grumbling, RN

## 2022-05-25 ENCOUNTER — Ambulatory Visit: Payer: Medicare HMO

## 2022-05-28 ENCOUNTER — Ambulatory Visit (INDEPENDENT_AMBULATORY_CARE_PROVIDER_SITE_OTHER): Payer: Medicare HMO | Admitting: Student

## 2022-05-28 VITALS — BP 124/72 | HR 80 | Ht 69.0 in | Wt 214.0 lb

## 2022-05-28 DIAGNOSIS — Z01818 Encounter for other preprocedural examination: Secondary | ICD-10-CM

## 2022-05-28 NOTE — Progress Notes (Signed)
SUBJECTIVE:   [name]  is here for a Pre-operative physical at the request of Dr. Mardelle Matte.   She  is having right rotator cuff surgery on TBD.  Personal or family hx of adverse outcome to anesthesia? No  Chipped, cracked, missing, or loose teeth? Yes , missing several, has partials. Those that are in place are not loose Decreased ROM of neck? No  Able to walk up 2 flights of stairs without becoming significantly short of breath or having chest pain? No   Revised Goldman Criteria: High Risk Surgery (intraperitoneal, intrathoracic, aortic): No  Ischemic heart disease (Prior MI, +excercise stress test, angina, nitrate use, Qwave):  CAD with CABG 2009 History of congestive heart failure: Yes - last EF 45% in 04/22 History of cerebrovascular disease (TIA or stroke): Yes  2017 Diabetes requiring preoperative treatment with insulin: No  Preoperative Cr >2.0: No   Revised Cardiac Risk Index Scoring - risk for death, MI, or cardiac arrest No risk factors -- 0.4% One risk factor -- 0.9%  Two risk factors -- 6.6%  Three or more risk factors -- >11%    OBJECTIVE:  There were no vitals filed for this visit. There is no height or weight on file to calculate BMI.  Physical Exam Constitutional:      General: She is not in acute distress.    Appearance: She is obese.  HENT:     Head:     Comments: Multiple missing teeth, those that remain are well-applied and not loose Neck:     Vascular: No carotid bruit.  Cardiovascular:     Comments: Irregularly irregular, no murmur, rub, or gallop Pulmonary:     Effort: Pulmonary effort is normal.  Musculoskeletal:     Comments: Minimal LE edema. Moving all extremities independently. Movement about R shoulder limited by pain in all fields  Skin:    General: Skin is warm and dry.  Neurological:     General: No focal deficit present.     Mental Status: She is alert.      ASSESSMENT/PLAN:   Pre-Operative Clearance The patient is deemed moderate  cardiac risk for the proposed procedure. Revised Cardiac Risk Index risk of major cardiac event is 11%. Functional capacity in METs is 3.63 according to Duke Activity Status Index. Activity limited by orthopedic issues to be addressed by surgical intervention. Engaged patient in shared decision-making and she feels that benefits outweigh risks at this time. She has already been cleared by her cardiologist, Dr. Aundra Dubin to undergo the procedure with the understanding that she should hold her Eliquis for 2 days prior to surgery.   The patient voiced understanding and agreement to the plan.  No follow-ups on file. [name], MD 05/28/2022, 8:11 AM PGY-2, [place]

## 2022-05-28 NOTE — Patient Instructions (Addendum)
[name],  It is a pleasure to see you today!  As we discussed, given your history of heart disease, atrial fibrillation, and previous stroke, you will always be at higher risk for adverse outcomes related to surgery than the general population.  Your risk of a adverse cardiac event with surgery is 11%.  That said, I think that we have more or less optimized the medical management of your chronic conditions for the time being and you are as stable for surgery today as you ever will be.  It sounds like you are shoulder disease is very much impacting your quality of life and, per our discussion, you feel that the benefits of the surgery outweigh the risks.  Given that discussion, I am willing to clear you for surgery understanding that your cardiologist, Dr. Aundra Dubin, is also okay with you moving forward with the operation. You should be sure to hold your Eliquis for 2 days prior to surgery.  If your surgeon has any further questions, they can always reach out to our office at [phone].  [name], MD

## 2022-06-07 ENCOUNTER — Ambulatory Visit
Admission: RE | Admit: 2022-06-07 | Discharge: 2022-06-07 | Disposition: A | Discharge status: Home / Self Care | Payer: Medicare HMO | Source: Ambulatory Visit | Attending: Orthopedic Surgery | Admitting: Orthopedic Surgery

## 2022-06-07 ENCOUNTER — Telehealth: Payer: Self-pay | Admitting: *Deleted

## 2022-06-07 ENCOUNTER — Telehealth: Payer: Self-pay

## 2022-06-07 DIAGNOSIS — M25511 Pain in right shoulder: Secondary | ICD-10-CM | POA: Diagnosis not present

## 2022-06-07 DIAGNOSIS — Z01818 Encounter for other preprocedural examination: Secondary | ICD-10-CM

## 2022-06-07 NOTE — Telephone Encounter (Signed)
Patient calls nurse line regarding possible side effects to duloxetine.   Patient reports that she is having itchiness on hands, scalp and face and dry mouth.   Reports that these side effects have been going on for several months, with no improvement.   Denies difficulty breathing, chest pain or difficulty swallowing.   Patient states that she and provider had discussed waiting to see if these side effects would improve. Patient is interested in alternative options. Patient has not stopped medication at this time. She would like provider recommendation before making changes to medication.   ED precautions given.   Will forward to MD.   Talbot Grumbling, RN

## 2022-06-07 NOTE — Telephone Encounter (Signed)
Received a clearance form from [facility] for right reverse total shoulder replacement under [place], however pt hasn't been seen for stroke since she saw Dr Erlinda Hong in 2019. Per Dr Jaynee Eagles, please have [facility] consult pt's PCP for clearance. I called the surgery scheduled, Sherri, at [facility] and LVM with this information along with our office number if she has any questions.

## 2022-06-11 ENCOUNTER — Telehealth: Payer: Self-pay

## 2022-06-11 NOTE — Telephone Encounter (Signed)
Received phone call from patient regarding paperwork completion for upcoming surgical procedure. Called [facility]. They faxed over clearance form at the beginning of the month, however, have not yet received it.   I asked them to re fax the paperwork.   Will forward to both PCP and Dr. Joelyn Oms as he saw patient for pre- op clearance.   Talbot Grumbling, RN

## 2022-06-13 NOTE — Telephone Encounter (Signed)
Patient calls nurse line in regard to Duloxetine.   Will forward to PCP for review.

## 2022-06-14 ENCOUNTER — Encounter: Payer: Self-pay | Admitting: Podiatry

## 2022-06-14 ENCOUNTER — Ambulatory Visit: Payer: Medicare HMO | Admitting: Podiatry

## 2022-06-14 DIAGNOSIS — M79675 Pain in left toe(s): Secondary | ICD-10-CM

## 2022-06-14 DIAGNOSIS — B351 Tinea unguium: Secondary | ICD-10-CM | POA: Diagnosis not present

## 2022-06-14 DIAGNOSIS — M79674 Pain in right toe(s): Secondary | ICD-10-CM

## 2022-06-14 DIAGNOSIS — E1151 Type 2 diabetes mellitus with diabetic peripheral angiopathy without gangrene: Secondary | ICD-10-CM

## 2022-06-15 NOTE — Telephone Encounter (Signed)
Returned patient's call to discuss. She endorses generalized itchy skin. Has been ongoing for a few months. No obvious rash. Also has very dry mouth. Thinks it's related to her duloxetine since she got a letter from her insurance a while back that listed all the side effects and it was several pages long.  My initial impression is that her itchy skin and dry mouth are unrelated to the duloxetine. Advised her to continue for now and she will schedule an office visit for evaluation and to discuss further.   Alcus Dad, MD PGY-3, [place]

## 2022-06-20 DIAGNOSIS — S46011D Strain of muscle(s) and tendon(s) of the rotator cuff of right shoulder, subsequent encounter: Secondary | ICD-10-CM | POA: Diagnosis not present

## 2022-06-22 NOTE — Progress Notes (Signed)
Sent message, via epic in basket, requesting orders in epic from surgeon.  

## 2022-06-23 NOTE — Progress Notes (Signed)
  Subjective:  Patient ID: [name], female    DOB: 03-01-1941,  MRN: [id number]  [name] presents to clinic today for at risk foot care. Pt has h/o NIDDM with PAD and painful elongated mycotic toenails 1-5 bilaterally which are tender when wearing enclosed shoe gear. Pain is relieved with periodic professional debridement.  Patient states blood glucose was 165 mg/dl today.  Last A1c was 7.0%.  She states she has been applying Neosporin daily since her last visit to right great toe where she had a previous abrasion. She notes no new problems on today's visit.  PCP is [name] Dad, MD , and last visit was  February 27, 2022  Allergies  Allergen Reactions   Benazepril Other (See Comments)    Unknown reaction at age 68-65 - possibly dizziness   Cymbalta [Duloxetine Hcl]     Itch, Dry mouth    Ozempic (0.25 Or 0.5 Mg-Dose) [Semaglutide(0.25 Or 0.'5mg'$ -Dos)] Diarrhea    Gi intollerance   Tape     TOLERATES PAPER TAPE ONLY- due to thin skin. NOT ALLERGY PER PT   Angiotensin Receptor Blockers Other (See Comments)    Hypotension reaction   Bactrim [Sulfamethoxazole-Trimethoprim] Other (See Comments)    Unknown reaction   Fe-Succ-C-Thre-B12-Des Stomach Other (See Comments)    Unknown reaction  Ferocon?    Review of Systems: Negative except as noted in the HPI.  Objective:  Vascular Examination: CFT <3 seconds b/l. DP pulses faintly palpable b/l. PT pulses diminished b/l. Digital hair absent. Skin temperature gradient warm to warm b/l. No ischemia or gangrene. No cyanosis or clubbing noted b/l.    Neurological Examination: Sensation grossly intact b/l with 10 gram monofilament. Vibratory sensation intact b/l.   Dermatological Examination: Pedal skin warm and supple b/l. Toenails 1-5 b/l thick, discolored, elongated with subungual debris and pain on dorsal palpation.   Right great toe abrasion resolved and now with blanchable erythema noted at distal tip. No pain on palpation.  No underlying fluctuance.  Musculoskeletal Examination: Muscle strength 5/5 to b/l LE. HAV with bunion bilaterally and hammertoes 2-5 b/l. Utilizes walker for ambulation assistance.  Radiographs: None  Last A1c:      Latest Ref Rng & Units 12/15/2021    8:34 AM 09/07/2021    3:50 PM 07/14/2021    3:09 PM  Hemoglobin A1C  Hemoglobin-A1c 0.0 - 7.0 % 8.0  7.6  7.7    Assessment/Plan: 1. Pain due to onychomycosis of toenails of both feet   2. Diabetes mellitus type 2 with peripheral artery disease ([place])     -Examined patient. -Abrasion resolved right great toe. Recommended use of open toed compression hose to minimize pressure to digits. -Patient to continue soft, supportive shoe gear daily. -Toenails 1-5 b/l were debrided in length and girth with sterile nail nippers and dremel without iatrogenic bleeding.  -Patient/POA to call should there be question/concern in the interim.   Return in about 3 months (around 09/14/2022).  [name], DPM

## 2022-06-25 DIAGNOSIS — S46011A Strain of muscle(s) and tendon(s) of the rotator cuff of right shoulder, initial encounter: Secondary | ICD-10-CM | POA: Diagnosis not present

## 2022-06-25 NOTE — Patient Instructions (Signed)
DUE TO COVID-19 ONLY TWO VISITORS  (aged 83 and older)  ARE ALLOWED TO COME WITH YOU AND STAY IN THE WAITING ROOM ONLY DURING PRE OP AND PROCEDURE.   **NO VISITORS ARE ALLOWED IN THE SHORT STAY AREA OR RECOVERY ROOM!!**  IF YOU WILL BE ADMITTED INTO THE HOSPITAL YOU ARE ALLOWED ONLY FOUR SUPPORT PEOPLE DURING VISITATION HOURS ONLY (7 AM -8PM)   The support person(s) must pass our screening, gel in and out, and wear a mask at all times, including in the patient's room. Patients must also wear a mask when staff or their support person are in the room. Visitors GUEST BADGE MUST BE WORN VISIBLY  One adult visitor may remain with you overnight and MUST be in the room by 8 P.M.     Your procedure is scheduled on: 07/10/22   Report to [facility] Main Entrance    Report to admitting at: 8:00 AM   Call this number if you have problems the morning of surgery [phone]   Do not eat food :After Midnight.   After Midnight you may have the following liquids until : 7:30 AM DAY OF SURGERY  Water Black Coffee (sugar ok, NO MILK/CREAM OR CREAMERS)  Tea (sugar ok, NO MILK/CREAM OR CREAMERS) regular and decaf                             Plain Jell-O (NO RED)                                           Fruit ices (not with fruit pulp, NO RED)                                     Popsicles (NO RED)                                                                  Juice: apple, WHITE grape, WHITE cranberry Sports drinks like Gatorade (NO RED)     Drink G2 drink AT :  7:30 AM the day of surgery.      The day of surgery:  Drink ONE (1) Pre-Surgery Clear Ensure or G2 at AM the morning of surgery. Drink in one sitting. Do not sip.  This drink was given to you during your hospital  pre-op appointment visit. Nothing else to drink after completing the  Pre-Surgery Clear Ensure or G2.          If you have questions, please contact your surgeon's office.    Oral Hygiene is also important to  reduce your risk of infection.                                    Remember - BRUSH YOUR TEETH THE MORNING OF SURGERY WITH YOUR REGULAR TOOTHPASTE   Do NOT smoke after Midnight   Take these medicines the morning of surgery with A SIP OF WATER: duloxetine,carvedilol.Tylenol as needed. How to  Manage Your Diabetes Before and After Surgery  Why is it important to control my blood sugar before and after surgery? Improving blood sugar levels before and after surgery helps healing and can limit problems. A way of improving blood sugar control is eating a healthy diet by:  Eating less sugar and carbohydrates  Increasing activity/exercise  Talking with your doctor about reaching your blood sugar goals High blood sugars (greater than 180 mg/dL) can raise your risk of infections and slow your recovery, so you will need to focus on controlling your diabetes during the weeks before surgery. Make sure that the doctor who takes care of your diabetes knows about your planned surgery including the date and location.  How do I manage my blood sugar before surgery? Check your blood sugar at least 4 times a day, starting 2 days before surgery, to make sure that the level is not too high or low. Check your blood sugar the morning of your surgery when you wake up and every 2 hours until you get to the Short Stay unit. If your blood sugar is less than 70 mg/dL, you will need to treat for low blood sugar: Do not take insulin. Treat a low blood sugar (less than 70 mg/dL) with  cup of clear juice (cranberry or apple), 4 glucose tablets, OR glucose gel. Recheck blood sugar in 15 minutes after treatment (to make sure it is greater than 70 mg/dL). If your blood sugar is not greater than 70 mg/dL on recheck, call [phone] for further instructions. Report your blood sugar to the short stay nurse when you get to Short Stay.  If you are admitted to the hospital after surgery: Your blood sugar will be checked by the  staff and you will probably be given insulin after surgery (instead of oral diabetes medicines) to make sure you have good blood sugar levels. The goal for blood sugar control after surgery is 80-180 mg/dL.   WHAT DO I DO ABOUT MY DIABETES MEDICATION?  Do not take oral diabetes medicines (pills) the morning of surgery.  THE DAY BEFORE SURGERY, DO NOT take jardiance.      THE MORNING OF SURGERY, DO NOT TAKE ANY ORAL DIABETIC MEDICATIONS DAY OF YOUR SURGERY  Bring CPAP mask and tubing day of surgery.                              You may not have any metal on your body including hair pins, jewelry, and body piercing             Do not wear make-up, lotions, powders, perfumes/cologne, or deodorant  Do not wear nail polish including gel and S&S, artificial/acrylic nails, or any other type of covering on natural nails including finger and toenails. If you have artificial nails, gel coating, etc. that needs to be removed by a nail salon please have this removed prior to surgery or surgery may need to be canceled/ delayed if the surgeon/ anesthesia feels like they are unable to be safely monitored.   Do not shave  48 hours prior to surgery.               Men may shave face and neck.   Do not bring valuables to the hospital. [place].   Contacts, dentures or bridgework may not be worn into  surgery.   Bring small overnight bag day of surgery.   DO NOT [place]. PHARMACY WILL DISPENSE MEDICATIONS LISTED ON YOUR MEDICATION LIST TO YOU DURING YOUR ADMISSION [place]!    Patients discharged on the day of surgery will not be allowed to drive home.  Someone NEEDS to stay with you for the first 24 hours after anesthesia.   Special Instructions: Bring a copy of your healthcare power of attorney and living will documents         the day of surgery if you haven't scanned them before.              Please read  over the following fact sheets you were given: IF YOU HAVE QUESTIONS ABOUT YOUR PRE-OP INSTRUCTIONS PLEASE CALL [phone]     [facility] - Preparing for Surgery Before surgery, you can play an important role.  Because skin is not sterile, your skin needs to be as free of germs as possible.  You can reduce the number of germs on your skin by washing with CHG (chlorahexidine gluconate) soap before surgery.  CHG is an antiseptic cleaner which kills germs and bonds with the skin to continue killing germs even after washing. Please DO NOT use if you have an allergy to CHG or antibacterial soaps.  If your skin becomes reddened/irritated stop using the CHG and inform your nurse when you arrive at Short Stay. Do not shave (including legs and underarms) for at least 48 hours prior to the first CHG shower.  You may shave your face/neck. Please follow these instructions carefully:  1.  Shower with CHG Soap the night before surgery and the  morning of Surgery.  2.  If you choose to wash your hair, wash your hair first as usual with your  normal  shampoo.  3.  After you shampoo, rinse your hair and body thoroughly to remove the  shampoo.                           4.  Use CHG as you would any other liquid soap.  You can apply chg directly  to the skin and wash                       Gently with a scrungie or clean washcloth.  5.  Apply the CHG Soap to your body ONLY FROM THE NECK DOWN.   Do not use on face/ open                           Wound or open sores. Avoid contact with eyes, ears mouth and genitals (private parts).                       Wash face,  Genitals (private parts) with your normal soap.             6.  Wash thoroughly, paying special attention to the area where your surgery  will be performed.  7.  Thoroughly rinse your body with warm water from the neck down.  8.  DO NOT shower/wash with your normal soap after using and rinsing off  the CHG Soap.                9.  Pat yourself dry with a  clean towel.  10.  Wear clean pajamas.            11.  Place clean sheets on your bed the night of your first shower and do not  sleep with pets. Day of Surgery : Do not apply any lotions/deodorants the morning of surgery.  Please wear clean clothes to the hospital/surgery center.  FAILURE TO FOLLOW THESE INSTRUCTIONS MAY RESULT IN THE CANCELLATION OF YOUR SURGERY PATIENT SIGNATURE_________________________________  NURSE SIGNATURE__________________________________  ________________________________________________________________________ [facility]- Preparing for Total Shoulder Arthroplasty    Before surgery, you can play an important role. Because skin is not sterile, your skin needs to be as free of germs as possible. You can reduce the number of germs on your skin by using the following products. Benzoyl Peroxide Gel Reduces the number of germs present on the skin Applied twice a day to shoulder area starting two days before surgery    ==================================================================  Please follow these instructions carefully:  BENZOYL PEROXIDE 5% GEL  Please do not use if you have an allergy to benzoyl peroxide.   If your skin becomes reddened/irritated stop using the benzoyl peroxide.  Starting two days before surgery, apply as follows: Apply benzoyl peroxide in the morning and at night. Apply after taking a shower. If you are not taking a shower clean entire shoulder front, back, and side along with the armpit with a clean wet washcloth.  Place a quarter-sized dollop on your shoulder and rub in thoroughly, making sure to cover the front, back, and side of your shoulder, along with the armpit.   2 days before ____ AM   ____ PM              1 day before ____ AM   ____ PM                         Do this twice a day for two days.  (Last application is the night before surgery, AFTER using the CHG soap as described below).  Do NOT apply benzoyl  peroxide gel on the day of surgery.   Incentive Spirometer  An incentive spirometer is a tool that can help keep your lungs clear and active. This tool measures how well you are filling your lungs with each breath. Taking long deep breaths may help reverse or decrease the chance of developing breathing (pulmonary) problems (especially infection) following: A long period of time when you are unable to move or be active. BEFORE THE PROCEDURE  If the spirometer includes an indicator to show your best effort, your nurse or respiratory therapist will set it to a desired goal. If possible, sit up straight or lean slightly forward. Try not to slouch. Hold the incentive spirometer in an upright position. INSTRUCTIONS FOR USE  Sit on the edge of your bed if possible, or sit up as far as you can in bed or on a chair. Hold the incentive spirometer in an upright position. Breathe out normally. Place the mouthpiece in your mouth and seal your lips tightly around it. Breathe in slowly and as deeply as possible, raising the piston or the ball toward the top of the column. Hold your breath for 3-5 seconds or for as long as possible. Allow the piston or ball to fall to the bottom of the column. Remove the mouthpiece from your mouth and breathe out normally. Rest for a few seconds and repeat Steps 1 through 7 at least 10 times every 1-2 hours when  you are awake. Take your time and take a few normal breaths between deep breaths. The spirometer may include an indicator to show your best effort. Use the indicator as a goal to work toward during each repetition. After each set of 10 deep breaths, practice coughing to be sure your lungs are clear. If you have an incision (the cut made at the time of surgery), support your incision when coughing by placing a pillow or rolled up towels firmly against it. Once you are able to get out of bed, walk around indoors and cough well. You may stop using the incentive spirometer  when instructed by your caregiver.  RISKS AND COMPLICATIONS Take your time so you do not get dizzy or light-headed. If you are in pain, you may need to take or ask for pain medication before doing incentive spirometry. It is harder to take a deep breath if you are having pain. AFTER USE Rest and breathe slowly and easily. It can be helpful to keep track of a log of your progress. Your caregiver can provide you with a simple table to help with this. If you are using the spirometer at home, follow these instructions: [place] IF:  You are having difficultly using the spirometer. You have trouble using the spirometer as often as instructed. Your pain medication is not giving enough relief while using the spirometer. You develop fever of 100.5 F (38.1 C) or higher. SEEK IMMEDIATE MEDICAL CARE IF:  You cough up bloody sputum that had not been present before. You develop fever of 102 F (38.9 C) or greater. You develop worsening pain at or near the incision site. MAKE SURE YOU:  Understand these instructions. Will watch your condition. Will get help right away if you are not doing well or get worse. Document Released: 03/11/2007 Document Revised: 01/21/2012 Document Reviewed: 05/12/2007 [facility] Patient Information 2014 [place], [place].   ________________________________________________________________________

## 2022-06-27 ENCOUNTER — Other Ambulatory Visit: Payer: Self-pay

## 2022-06-27 ENCOUNTER — Encounter (HOSPITAL_COMMUNITY): Payer: Self-pay

## 2022-06-27 ENCOUNTER — Encounter (HOSPITAL_COMMUNITY)
Admission: RE | Admit: 2022-06-27 | Discharge: 2022-06-27 | Disposition: A | Discharge status: Home / Self Care | Payer: Medicare HMO | Source: Ambulatory Visit | Attending: Orthopedic Surgery | Admitting: Orthopedic Surgery

## 2022-06-27 VITALS — BP 144/81 | HR 67 | Temp 97.9°F | Ht 69.0 in | Wt 225.0 lb

## 2022-06-27 DIAGNOSIS — Z01818 Encounter for other preprocedural examination: Secondary | ICD-10-CM | POA: Insufficient documentation

## 2022-06-27 DIAGNOSIS — E1142 Type 2 diabetes mellitus with diabetic polyneuropathy: Secondary | ICD-10-CM | POA: Diagnosis not present

## 2022-06-27 DIAGNOSIS — I10 Essential (primary) hypertension: Secondary | ICD-10-CM | POA: Diagnosis not present

## 2022-06-27 DIAGNOSIS — I6930 Unspecified sequelae of cerebral infarction: Secondary | ICD-10-CM

## 2022-06-27 LAB — CBC
HCT: 38.7 % (ref 36.0–46.0)
Hemoglobin: 12.1 g/dL (ref 12.0–15.0)
MCH: 30.6 pg (ref 26.0–34.0)
MCHC: 31.3 g/dL (ref 30.0–36.0)
MCV: 97.7 fL (ref 80.0–100.0)
Platelets: 128 10*3/uL — ABNORMAL LOW (ref 150–400)
RBC: 3.96 MIL/uL (ref 3.87–5.11)
RDW: 13.9 % (ref 11.5–15.5)
WBC: 5.4 10*3/uL (ref 4.0–10.5)
nRBC: 0 % (ref 0.0–0.2)

## 2022-06-27 LAB — BASIC METABOLIC PANEL
Anion gap: 11 (ref 5–15)
BUN: 68 mg/dL — ABNORMAL HIGH (ref 8–23)
CO2: 27 mmol/L (ref 22–32)
Calcium: 10.3 mg/dL (ref 8.9–10.3)
Chloride: 101 mmol/L (ref 98–111)
Creatinine, Ser: 1.92 mg/dL — ABNORMAL HIGH (ref 0.44–1.00)
GFR, Estimated: 26 mL/min — ABNORMAL LOW (ref >60)
Glucose, Bld: 162 mg/dL — ABNORMAL HIGH (ref 70–99)
Potassium: 4.5 mmol/L (ref 3.5–5.1)
Sodium: 139 mmol/L (ref 135–145)

## 2022-06-27 LAB — HEMOGLOBIN A1C
Hgb A1c MFr Bld: 8.2 % — ABNORMAL HIGH (ref 4.8–5.6)
Mean Plasma Glucose: 188.64 mg/dL

## 2022-06-27 LAB — SURGICAL PCR SCREEN
MRSA, PCR: NEGATIVE
Staphylococcus aureus: NEGATIVE

## 2022-06-27 LAB — GLUCOSE, CAPILLARY: Glucose-Capillary: 164 mg/dL — ABNORMAL HIGH (ref 70–99)

## 2022-06-27 NOTE — Progress Notes (Signed)
For Short Stay: [place] appointment date: Date of COVID positive in last 66 days:  Bowel Prep reminder:   For Anesthesia: PCP - Dr. Peyton Bottoms Cardiologist - Dr. Loralie Champagne. Clearance: Diona Browner: NP: 05/18/22: EPIC  Chest x-ray -  EKG - 10/18/21 Stress Test -  ECHO - 05/18/22 Cardiac Cath -  Pacemaker/ICD device last checked: Pacemaker orders received: Device Rep notified:  Spinal Cord Stimulator:  Sleep Study - Yes CPAP - NO  Fasting Blood Sugar - 160's Checks Blood Sugar __1___ times a day Date and result of last Hgb A1c-  Blood Thinner Instructions: Aspirin Instructions: Last Dose:  Activity level: Can go up a flight of stairs and activities of daily living without stopping and without chest pain and/or shortness of breath   Able to exercise without chest pain and/or shortness of breath   Unable to go up a flight of stairs without chest pain and/or shortness of breath     Anesthesia review: Hx: CVA.CAD,CHF,DIA,HTN,OSA(NO CPAP)  Patient denies shortness of breath, fever, cough and chest pain at PAT appointment   Patient verbalized understanding of instructions that were given to them at the PAT appointment. Patient was also instructed that they will need to review over the PAT instructions again at home before surgery.

## 2022-06-27 NOTE — Progress Notes (Signed)
Lab. Results: Creatinine: 1.92 A1C: 8.2

## 2022-06-29 ENCOUNTER — Encounter (HOSPITAL_COMMUNITY): Payer: Self-pay | Admitting: Physician Assistant

## 2022-07-03 NOTE — Progress Notes (Signed)
ID:  [name], DOB 1941/07/12, MRN [id number]   Provider location:  Advanced Heart Failure Type of Visit: Established patient   PCP:  Alcus Dad, MD  Primary Cardiologist:  Fransico Him, MD HF Cardiologist: Dr. Aundra Dubin   History of Present Illness: [name] is a 81 y.o. female who has a history of CAD status post CABG 2009, ischemic cardiomyopathy with chronic systolic CHF, persistent atrial fibrillation with stroke in 03/2016 when INR was subtherapeutic, stage III CKD, morbid obesity, pulmonary hypertension, diabetes, GI bleed 03/2017, and severe sleep apnea.    Seen in [facility] clinics 09/10/18 and 09/24/18, both times with volume overload and marked peripheral edema with skin breakdown. Torsemide increased at each visit. Echo in 7/19 showed EF 45-50% with mild RV dilation/mild RV systolic dysfunction and D-shaped interventricular septum.    AKI earlier in 7/20 in setting of excessive torsemide, she had increased dose to 60 qam/40 qpm on her own.  This was cut back to 40 mg daily and losartan and spironolactone were stopped.   Echo in 9/20 showed EF 45% with mildly decreased RV systolic function, PASP 46 mmHg with dilated IVC.  PYP scan in 10/21 was negative.   Echo in 4/22 showed EF 45% with diffuse hypokinesis, mildly decreased RV systolic function, PASP 47, IVC dilated, moderate TR, mild MR.   Follow up 5/23, NYHA III and volume up. Lasix increased to 60 mg bid.  Today she returns for HF follow up with her son. Overall feeling fine. She can walk short distances with her walker without significant dyspnea. Main issue is shoulder pain. She was planning on right rotator cuff surgery with Dr. Mardelle Matte but surgery cancelled due to elevated blood sugars. Planning PT for now. Denies palpitations, abnormal bleeding, CP, dizziness, edema, or PND. She has chronic orthopnea. Appetite ok. No fever or chills. Weight at home 216 pounds, down from 227 lbs. Taking all  medications. She does not wear CPAP.  ECG (personally reviewed): atrial fibrillation 68 bpm  ReDs: 28%  Labs (12/18): K 3.9, Creatinine 1.65 Labs (5/19): K 4.1, creatinine 1.7 Labs (6/19): hgb 8.2 Labs (8/19): K 4.3, creatinine 1.73 Labs (9/19): K 4, creatinine 1.56 Labs (11/19): K 4.7, creatinine 2.04 Labs (12/19): creatinine 1.44 Labs (7/20): K 5, creatinine 2.49 => 1.86, Hgb 11.1 Labs (8/20): K 3.9, creatinine 1.79 Labs (9/20): LDL 50 Labs (11/20): K 3.8, creatinine 2.01 Labs (2/21): K 4, creatinine 1.9 Labs (7/21): K 4.2, creatinine 1.84, hgb 11.2 Labs (9/21): myeloma panel negative, urine immunofixation negative, LDL 50, K 4.6, creatinine 1.76 Labs (3/22): hgb 13.4, K 4.3, creatinine 1.86 Labs (5/22): K 4.5, creatinine 1.98 Labs (12/22): K 4.6, creatinine 1.71, LDL 47 Labs (8/23): K 4.5, creatinine 1.92  Review of systems complete and found to be negative unless listed in HPI.    Past Medical History 1. Chronic systolic CHF: Ischemic cardiomyopathy with prominent RV failure.   - Echo (11/18): LVEF 35-40%, severe RV dilation, severe RAE, severe TR.  - Echo (7/19): EF 45-50%, diffuse hypokinesis, mild RV dilation with mildly decreased RV systolic function, D-shaped interventricular septum suggestive of RV pressure/volume overload, mild MR, moderate TR, PASP 49 mmhg.  - Echo (9/20): EF 45%, mild LV dilation, mild RV dilation with mildly decreased systolic function, PASP 46 mmHg, IVC dilated.  - PYP scan (10/21): Negative.  - Echo (4/22): EF 45% with diffuse hypokinesis, mildly decreased RV systolic function, PASP 47, IVC dilated, moderate TR, mild MR. 2.  CAD: CABG 2009. No angiography since that time.   3. HTN 4. Atrial fibrillation: Chronic since 2013.  5. CKD stage 3 6. Morbid obesity 7. OSA 8. Chronic venous stasis with RLE wound/Bullae 9. CVA 5/18.   Current Outpatient Medications  Medication Sig Dispense Refill   acetaminophen (TYLENOL 8 HOUR) 650 MG CR tablet Take  1 tablet (650 mg total) by mouth every 8 (eight) hours as needed for pain. 30 tablet 0   apixaban (ELIQUIS) 2.5 MG TABS tablet Take 1 tablet (2.5 mg total) by mouth 2 (two) times daily. 180 tablet 3   atorvastatin (LIPITOR) 40 MG tablet TAKE 1 TABLET DAILY 100 tablet 2   Blood Glucose Monitoring Suppl (ONE TOUCH ULTRA 2) w/Device KIT USE TO CHECK BLOOD SUGAR UPTO 3 TIMES A DAY 1 kit 0   carvedilol (COREG) 6.25 MG tablet TAKE 1 TABLET TWICE A DAY  WITH MEALS 180 tablet 3   Cholecalciferol 25 MCG (1000 UT) tablet Take 1,000 Units by mouth daily.     docusate sodium (COLACE) 100 MG capsule Take 100 mg by mouth at bedtime.     DULoxetine (CYMBALTA) 20 MG capsule TAKE 1 CAPSULE DAILY 90 capsule 3   ferrous sulfate 325 (65 FE) MG tablet TAKE 1 TABLET BY MOUTH EVERY DAY WITH BREAKFAST 90 tablet 1   glucose blood (ONE TOUCH ULTRA TEST) test strip Use three times a day 100 each 3   JARDIANCE 10 MG TABS tablet TAKE 1 TABLET DAILY BEFORE BREAKFAST 100 tablet 2   Lancets (ONETOUCH DELICA PLUS BDZHGD92E) MISC 1 application by Does not apply route daily. Please use to check blood sugar up to three times daily. 100 each 6   metolazone (ZAROXOLYN) 2.5 MG tablet Take only as directed by CHF clinic (Patient taking differently: Take 2.5 mg by mouth daily as needed. Take only as directed by CHF clinic) 10 tablet 0   Multiple Vitamins-Minerals (PRESERVISION AREDS 2+MULTI VIT PO) Take 1 capsule by mouth in the morning and at bedtime.     nitroGLYCERIN (NITROSTAT) 0.4 MG SL tablet PLACE 1 TABLET (0.4 MG TOTAL) UNDER THE TONGUE EVERY 5 (FIVE) MINUTES AS NEEDED FOR CHEST PAIN (UP TO 3 DOSES). 15 tablet 5   potassium chloride SA (KLOR-CON M20) 20 MEQ tablet Take 3 tablets (60 mEq total) by mouth 2 (two) times daily. 540 tablet 3   rOPINIRole (REQUIP) 0.5 MG tablet TAKE 2 TABLETS AT BEDTIME 180 tablet 3   spironolactone (ALDACTONE) 25 MG tablet Take 0.5 tablets (12.5 mg total) by mouth daily. 50 tablet 1   torsemide  (DEMADEX) 20 MG tablet Take 3 tablets (60 mg total) by mouth 2 (two) times daily. 450 tablet 3   vitamin B-12 (CYANOCOBALAMIN) 1000 MCG tablet Take 1 tablet (1,000 mcg total) by mouth daily. 90 tablet 1   zinc oxide 20 % ointment Apply 1 application topically as needed (buttocks irritation). 425 g 1   No current facility-administered medications for this encounter.   Allergies  Allergen Reactions   Benazepril Other (See Comments)    Unknown reaction at age 75-65 - possibly dizziness   Cymbalta [Duloxetine Hcl]     Itch, Dry mouth    Ozempic (0.25 Or 0.5 Mg-Dose) [Semaglutide(0.25 Or 0.5mg -Dos)] Diarrhea    Gi intollerance   Tape     TOLERATES PAPER TAPE ONLY- due to thin skin. NOT ALLERGY PER PT   Angiotensin Receptor Blockers Other (See Comments)    Hypotension reaction   Bactrim [Sulfamethoxazole-Trimethoprim] Other (See  Comments)    Unknown reaction   Fe-Succ-C-Thre-B12-Des Stomach Other (See Comments)    Unknown reaction  Ferocon?   Social History   Socioeconomic History   Marital status: Widowed    Spouse name: Not on file   Number of children: 2   Years of education: 38   Highest education level: Not on file  Occupational History    Comment: retired  Tobacco Use   Smoking status: Never   Smokeless tobacco: Never  Vaping Use   Vaping Use: Never used  Substance and Sexual Activity   Alcohol use: No   Drug use: Never   Sexual activity: Not Currently  Other Topics Concern   Not on file  Social History Narrative   Widowed   01/27/21 Lives with son,  in [place]   2 children   Not routinely exercising   Social Determinants of Health   Financial Resource Strain: Not on file  Food Insecurity: Not on file  Transportation Needs: Not on file  Physical Activity: Not on file  Stress: Not on file  Social Connections: Not on file  Intimate Partner Violence: Not on file   Family History  Problem Relation Age of Onset   Clotting disorder Mother        Cerebral  hemorrhage   Other Mother        cerebral hemorrhage   Emphysema Father        COD   Lung cancer Brother    Heart disease Neg Hx    BP 102/62   Pulse 74   Wt 99.7 kg (219 lb 12.8 oz)   LMP  (LMP Unknown)   SpO2 97%   BMI 32.46 kg/m   Wt Readings from Last 3 Encounters:  07/04/22 99.7 kg (219 lb 12.8 oz)  06/27/22 102.1 kg (225 lb)  05/28/22 97.1 kg (214 lb)    PHYSICAL EXAM:  General:  NAD. No resp difficulty, walked into clinic with standing rolling walker. HEENT: Normal Neck: Supple. No JVD. Carotids 2+ bilat; no bruits. No lymphadenopathy or thryomegaly appreciated. Cor: PMI nondisplaced. Irregular rate & rhythm. No rubs, gallops, or murmurs. Lungs: Clear Abdomen: Soft, nontender, nondistended. No hepatosplenomegaly. No bruits or masses. Good bowel sounds. Extremities: No cyanosis, clubbing, rash, trace BLE edema Neuro: Alert & oriented x 3, cranial nerves grossly intact. Moves all 4 extremities w/o difficulty. Affect pleasant.  ASSESSMENT & PLAN: 1. Chronic systolic CHF with prominent RV failure: Ischemic cardiomyopathy.  Echo in 7/19 showed LV EF 45-50% (improved) with mildly dilated/mildly dysfunction RV.  D-shaped septum suggested RV pressure/volume overload.  Echo in 9/20 showed EF 45%, mildly dysfunctional RV, PASP 46 mmHg with dilated IVC.  PYP scan negative and myeloma workup negative, no evidence for cardiac amyloidosis.  Echo in 4/22 showed EF 45% with diffuse hypokinesis, mildly decreased RV systolic function, PASP 47, IVC dilated, moderate TR, mild MR.  She is not volume overloaded by exam or REDS clip. NYHA class III symptoms, limited by ortho issues.  - Continue Jardiance 10 mg daily.  - Continue torsemide 60 mg bid. Recent labs reviewed and are stable. - Continue Coreg 6.25 mg bid.   - Continue spironolactone 12.5 mg daily.   - We have discussed Cardiomems in the past. This would be option to  help with volume management. 2. CAD s/p CABG: No chest pain.  -  Continue atorvastatin 40 mg daily. Good lipids in 12/22.     - No ASA with stable CAD on Eliquis.  3.  Atrial fibrillation: Chronic. Given long-term atrial fibrillation, she is unlikely to successfully cardiovert.  - Continue Eliquis 2.5 mg bid.   4. CKD stage 3: Last SCr 1.92 5. H/o CVA: Continue Eliquis.   6. OSA: She is not using her CPAP.  7. HTN: BP controlled.    Follow up 3 months with Dr. Aundra Dubin.  Signed, Rafael Bihari, FNP  07/04/2022  [facility] [address] [facility] [place] [address] [phone] (office) [phone] (fax)

## 2022-07-04 ENCOUNTER — Ambulatory Visit (HOSPITAL_COMMUNITY)
Admission: RE | Admit: 2022-07-04 | Discharge: 2022-07-04 | Disposition: A | Discharge status: Home / Self Care | Payer: Medicare HMO | Source: Ambulatory Visit | Attending: Family Medicine | Admitting: Family Medicine

## 2022-07-04 ENCOUNTER — Encounter (HOSPITAL_COMMUNITY): Payer: Self-pay

## 2022-07-04 VITALS — BP 102/62 | HR 74 | Wt 219.8 lb

## 2022-07-04 DIAGNOSIS — I130 Hypertensive heart and chronic kidney disease with heart failure and stage 1 through stage 4 chronic kidney disease, or unspecified chronic kidney disease: Secondary | ICD-10-CM | POA: Insufficient documentation

## 2022-07-04 DIAGNOSIS — Z951 Presence of aortocoronary bypass graft: Secondary | ICD-10-CM | POA: Diagnosis not present

## 2022-07-04 DIAGNOSIS — G4733 Obstructive sleep apnea (adult) (pediatric): Secondary | ICD-10-CM | POA: Diagnosis not present

## 2022-07-04 DIAGNOSIS — I272 Pulmonary hypertension, unspecified: Secondary | ICD-10-CM | POA: Insufficient documentation

## 2022-07-04 DIAGNOSIS — N183 Chronic kidney disease, stage 3 unspecified: Secondary | ICD-10-CM | POA: Diagnosis not present

## 2022-07-04 DIAGNOSIS — Z7901 Long term (current) use of anticoagulants: Secondary | ICD-10-CM | POA: Diagnosis not present

## 2022-07-04 DIAGNOSIS — I10 Essential (primary) hypertension: Secondary | ICD-10-CM

## 2022-07-04 DIAGNOSIS — E1122 Type 2 diabetes mellitus with diabetic chronic kidney disease: Secondary | ICD-10-CM | POA: Insufficient documentation

## 2022-07-04 DIAGNOSIS — I255 Ischemic cardiomyopathy: Secondary | ICD-10-CM | POA: Diagnosis not present

## 2022-07-04 DIAGNOSIS — I4819 Other persistent atrial fibrillation: Secondary | ICD-10-CM | POA: Insufficient documentation

## 2022-07-04 DIAGNOSIS — Z79899 Other long term (current) drug therapy: Secondary | ICD-10-CM | POA: Diagnosis not present

## 2022-07-04 DIAGNOSIS — I5022 Chronic systolic (congestive) heart failure: Secondary | ICD-10-CM | POA: Diagnosis not present

## 2022-07-04 DIAGNOSIS — Z8673 Personal history of transient ischemic attack (TIA), and cerebral infarction without residual deficits: Secondary | ICD-10-CM | POA: Insufficient documentation

## 2022-07-04 DIAGNOSIS — Z7984 Long term (current) use of oral hypoglycemic drugs: Secondary | ICD-10-CM | POA: Diagnosis not present

## 2022-07-04 DIAGNOSIS — I2510 Atherosclerotic heart disease of native coronary artery without angina pectoris: Secondary | ICD-10-CM | POA: Insufficient documentation

## 2022-07-04 NOTE — Progress Notes (Signed)
ReDS Vest / Clip - 07/04/22 1500       ReDS Vest / Clip   Station Marker C    Ruler Value 29    ReDS Value Range Low volume    ReDS Actual Value 28

## 2022-07-04 NOTE — Patient Instructions (Signed)
It was great to see you today! No medication changes are needed at this time.   Your physician recommends that you schedule a follow-up appointment in: 3 months with Dr Aundra Dubin  Do the following things EVERYDAY: Weigh yourself in the morning before breakfast. Write it down and keep it in a log. Take your medicines as prescribed Eat low salt foods--Limit salt (sodium) to 2000 mg per day.  Stay as active as you can everyday Limit all fluids for the day to less than 2 liters  At the [facility], you and your health needs are our priority. As part of our continuing mission to provide you with exceptional heart care, we have created designated Provider Care Teams. These Care Teams include your primary Cardiologist (physician) and Advanced Practice Providers (APPs- Physician Assistants and Nurse Practitioners) who all work together to provide you with the care you need, when you need it.   You may see any of the following providers on your designated Care Team at your next follow up: Dr Glori Bickers Dr Haynes Kerns, NP Lyda Jester, [place] [facility] [place], [place] Audry Riles, PharmD   Please be sure to bring in all your medications bottles to every appointment.    If you have any questions or concerns before your next appointment please send [place] a message through [place] or call our office at [phone].    TO LEAVE A MESSAGE FOR THE NURSE SELECT OPTION 2, PLEASE LEAVE A MESSAGE INCLUDING: YOUR NAME DATE OF BIRTH CALL BACK NUMBER REASON FOR CALL**this is important as we prioritize the call backs  YOU WILL RECEIVE A CALL BACK THE SAME DAY AS LONG AS YOU CALL BEFORE 4:00 PM

## 2022-07-10 ENCOUNTER — Inpatient Hospital Stay (HOSPITAL_COMMUNITY): Admission: RE | Admit: 2022-07-10 | Payer: Medicare HMO | Source: Ambulatory Visit | Admitting: Orthopedic Surgery

## 2022-07-10 ENCOUNTER — Encounter (HOSPITAL_COMMUNITY): Admission: RE | Payer: Self-pay | Source: Ambulatory Visit

## 2022-07-10 DIAGNOSIS — Z951 Presence of aortocoronary bypass graft: Secondary | ICD-10-CM | POA: Diagnosis not present

## 2022-07-10 DIAGNOSIS — I480 Paroxysmal atrial fibrillation: Secondary | ICD-10-CM | POA: Diagnosis not present

## 2022-07-10 DIAGNOSIS — Z7984 Long term (current) use of oral hypoglycemic drugs: Secondary | ICD-10-CM | POA: Diagnosis not present

## 2022-07-10 DIAGNOSIS — Z9181 History of falling: Secondary | ICD-10-CM | POA: Diagnosis not present

## 2022-07-10 DIAGNOSIS — I130 Hypertensive heart and chronic kidney disease with heart failure and stage 1 through stage 4 chronic kidney disease, or unspecified chronic kidney disease: Secondary | ICD-10-CM | POA: Diagnosis not present

## 2022-07-10 DIAGNOSIS — I272 Pulmonary hypertension, unspecified: Secondary | ICD-10-CM | POA: Diagnosis not present

## 2022-07-10 DIAGNOSIS — L89151 Pressure ulcer of sacral region, stage 1: Secondary | ICD-10-CM | POA: Diagnosis not present

## 2022-07-10 DIAGNOSIS — I252 Old myocardial infarction: Secondary | ICD-10-CM | POA: Diagnosis not present

## 2022-07-10 DIAGNOSIS — I2510 Atherosclerotic heart disease of native coronary artery without angina pectoris: Secondary | ICD-10-CM | POA: Diagnosis not present

## 2022-07-10 DIAGNOSIS — Z96652 Presence of left artificial knee joint: Secondary | ICD-10-CM | POA: Diagnosis not present

## 2022-07-10 DIAGNOSIS — Z8673 Personal history of transient ischemic attack (TIA), and cerebral infarction without residual deficits: Secondary | ICD-10-CM | POA: Diagnosis not present

## 2022-07-10 DIAGNOSIS — N189 Chronic kidney disease, unspecified: Secondary | ICD-10-CM | POA: Diagnosis not present

## 2022-07-10 DIAGNOSIS — G4733 Obstructive sleep apnea (adult) (pediatric): Secondary | ICD-10-CM | POA: Diagnosis not present

## 2022-07-10 DIAGNOSIS — E1142 Type 2 diabetes mellitus with diabetic polyneuropathy: Secondary | ICD-10-CM | POA: Diagnosis not present

## 2022-07-10 DIAGNOSIS — E1122 Type 2 diabetes mellitus with diabetic chronic kidney disease: Secondary | ICD-10-CM | POA: Diagnosis not present

## 2022-07-10 DIAGNOSIS — Z9049 Acquired absence of other specified parts of digestive tract: Secondary | ICD-10-CM | POA: Diagnosis not present

## 2022-07-10 DIAGNOSIS — M12811 Other specific arthropathies, not elsewhere classified, right shoulder: Secondary | ICD-10-CM | POA: Diagnosis not present

## 2022-07-10 DIAGNOSIS — M19011 Primary osteoarthritis, right shoulder: Secondary | ICD-10-CM | POA: Diagnosis not present

## 2022-07-10 DIAGNOSIS — I5022 Chronic systolic (congestive) heart failure: Secondary | ICD-10-CM | POA: Diagnosis not present

## 2022-07-10 SURGERY — ARTHROPLASTY, SHOULDER, TOTAL, REVERSE
Anesthesia: Choice | Site: Shoulder | Laterality: Right

## 2022-07-12 DIAGNOSIS — I252 Old myocardial infarction: Secondary | ICD-10-CM | POA: Diagnosis not present

## 2022-07-12 DIAGNOSIS — E1142 Type 2 diabetes mellitus with diabetic polyneuropathy: Secondary | ICD-10-CM | POA: Diagnosis not present

## 2022-07-12 DIAGNOSIS — Z9049 Acquired absence of other specified parts of digestive tract: Secondary | ICD-10-CM | POA: Diagnosis not present

## 2022-07-12 DIAGNOSIS — Z9181 History of falling: Secondary | ICD-10-CM | POA: Diagnosis not present

## 2022-07-12 DIAGNOSIS — I480 Paroxysmal atrial fibrillation: Secondary | ICD-10-CM | POA: Diagnosis not present

## 2022-07-12 DIAGNOSIS — E1122 Type 2 diabetes mellitus with diabetic chronic kidney disease: Secondary | ICD-10-CM | POA: Diagnosis not present

## 2022-07-12 DIAGNOSIS — N189 Chronic kidney disease, unspecified: Secondary | ICD-10-CM | POA: Diagnosis not present

## 2022-07-12 DIAGNOSIS — Z951 Presence of aortocoronary bypass graft: Secondary | ICD-10-CM | POA: Diagnosis not present

## 2022-07-12 DIAGNOSIS — I5022 Chronic systolic (congestive) heart failure: Secondary | ICD-10-CM | POA: Diagnosis not present

## 2022-07-12 DIAGNOSIS — I130 Hypertensive heart and chronic kidney disease with heart failure and stage 1 through stage 4 chronic kidney disease, or unspecified chronic kidney disease: Secondary | ICD-10-CM | POA: Diagnosis not present

## 2022-07-12 DIAGNOSIS — L89151 Pressure ulcer of sacral region, stage 1: Secondary | ICD-10-CM | POA: Diagnosis not present

## 2022-07-12 DIAGNOSIS — I272 Pulmonary hypertension, unspecified: Secondary | ICD-10-CM | POA: Diagnosis not present

## 2022-07-12 DIAGNOSIS — Z8673 Personal history of transient ischemic attack (TIA), and cerebral infarction without residual deficits: Secondary | ICD-10-CM | POA: Diagnosis not present

## 2022-07-12 DIAGNOSIS — Z96652 Presence of left artificial knee joint: Secondary | ICD-10-CM | POA: Diagnosis not present

## 2022-07-12 DIAGNOSIS — Z7984 Long term (current) use of oral hypoglycemic drugs: Secondary | ICD-10-CM | POA: Diagnosis not present

## 2022-07-12 DIAGNOSIS — I2510 Atherosclerotic heart disease of native coronary artery without angina pectoris: Secondary | ICD-10-CM | POA: Diagnosis not present

## 2022-07-12 DIAGNOSIS — M19011 Primary osteoarthritis, right shoulder: Secondary | ICD-10-CM | POA: Diagnosis not present

## 2022-07-12 DIAGNOSIS — G4733 Obstructive sleep apnea (adult) (pediatric): Secondary | ICD-10-CM | POA: Diagnosis not present

## 2022-07-17 DIAGNOSIS — G4733 Obstructive sleep apnea (adult) (pediatric): Secondary | ICD-10-CM | POA: Diagnosis not present

## 2022-07-17 DIAGNOSIS — I272 Pulmonary hypertension, unspecified: Secondary | ICD-10-CM | POA: Diagnosis not present

## 2022-07-17 DIAGNOSIS — Z9049 Acquired absence of other specified parts of digestive tract: Secondary | ICD-10-CM | POA: Diagnosis not present

## 2022-07-17 DIAGNOSIS — Z951 Presence of aortocoronary bypass graft: Secondary | ICD-10-CM | POA: Diagnosis not present

## 2022-07-17 DIAGNOSIS — Z9181 History of falling: Secondary | ICD-10-CM | POA: Diagnosis not present

## 2022-07-17 DIAGNOSIS — Z8673 Personal history of transient ischemic attack (TIA), and cerebral infarction without residual deficits: Secondary | ICD-10-CM | POA: Diagnosis not present

## 2022-07-17 DIAGNOSIS — E1142 Type 2 diabetes mellitus with diabetic polyneuropathy: Secondary | ICD-10-CM | POA: Diagnosis not present

## 2022-07-17 DIAGNOSIS — I252 Old myocardial infarction: Secondary | ICD-10-CM | POA: Diagnosis not present

## 2022-07-17 DIAGNOSIS — I2510 Atherosclerotic heart disease of native coronary artery without angina pectoris: Secondary | ICD-10-CM | POA: Diagnosis not present

## 2022-07-17 DIAGNOSIS — E1122 Type 2 diabetes mellitus with diabetic chronic kidney disease: Secondary | ICD-10-CM | POA: Diagnosis not present

## 2022-07-17 DIAGNOSIS — Z96652 Presence of left artificial knee joint: Secondary | ICD-10-CM | POA: Diagnosis not present

## 2022-07-17 DIAGNOSIS — I5022 Chronic systolic (congestive) heart failure: Secondary | ICD-10-CM | POA: Diagnosis not present

## 2022-07-17 DIAGNOSIS — I130 Hypertensive heart and chronic kidney disease with heart failure and stage 1 through stage 4 chronic kidney disease, or unspecified chronic kidney disease: Secondary | ICD-10-CM | POA: Diagnosis not present

## 2022-07-17 DIAGNOSIS — I480 Paroxysmal atrial fibrillation: Secondary | ICD-10-CM | POA: Diagnosis not present

## 2022-07-17 DIAGNOSIS — N189 Chronic kidney disease, unspecified: Secondary | ICD-10-CM | POA: Diagnosis not present

## 2022-07-17 DIAGNOSIS — L89151 Pressure ulcer of sacral region, stage 1: Secondary | ICD-10-CM | POA: Diagnosis not present

## 2022-07-17 DIAGNOSIS — Z7984 Long term (current) use of oral hypoglycemic drugs: Secondary | ICD-10-CM | POA: Diagnosis not present

## 2022-07-17 DIAGNOSIS — M19011 Primary osteoarthritis, right shoulder: Secondary | ICD-10-CM | POA: Diagnosis not present

## 2022-07-18 DIAGNOSIS — S46011A Strain of muscle(s) and tendon(s) of the rotator cuff of right shoulder, initial encounter: Secondary | ICD-10-CM | POA: Diagnosis not present

## 2022-07-19 ENCOUNTER — Encounter: Payer: Self-pay | Admitting: Family Medicine

## 2022-07-19 ENCOUNTER — Ambulatory Visit (INDEPENDENT_AMBULATORY_CARE_PROVIDER_SITE_OTHER): Payer: Medicare HMO | Admitting: Family Medicine

## 2022-07-19 VITALS — BP 133/66 | HR 69 | Ht 69.0 in | Wt 205.4 lb

## 2022-07-19 DIAGNOSIS — Z951 Presence of aortocoronary bypass graft: Secondary | ICD-10-CM | POA: Diagnosis not present

## 2022-07-19 DIAGNOSIS — Z9181 History of falling: Secondary | ICD-10-CM | POA: Diagnosis not present

## 2022-07-19 DIAGNOSIS — E1122 Type 2 diabetes mellitus with diabetic chronic kidney disease: Secondary | ICD-10-CM | POA: Diagnosis not present

## 2022-07-19 DIAGNOSIS — G4733 Obstructive sleep apnea (adult) (pediatric): Secondary | ICD-10-CM | POA: Diagnosis not present

## 2022-07-19 DIAGNOSIS — Z96652 Presence of left artificial knee joint: Secondary | ICD-10-CM | POA: Diagnosis not present

## 2022-07-19 DIAGNOSIS — I480 Paroxysmal atrial fibrillation: Secondary | ICD-10-CM | POA: Diagnosis not present

## 2022-07-19 DIAGNOSIS — N189 Chronic kidney disease, unspecified: Secondary | ICD-10-CM | POA: Diagnosis not present

## 2022-07-19 DIAGNOSIS — E1142 Type 2 diabetes mellitus with diabetic polyneuropathy: Secondary | ICD-10-CM | POA: Diagnosis not present

## 2022-07-19 DIAGNOSIS — M19011 Primary osteoarthritis, right shoulder: Secondary | ICD-10-CM | POA: Diagnosis not present

## 2022-07-19 DIAGNOSIS — Z7984 Long term (current) use of oral hypoglycemic drugs: Secondary | ICD-10-CM | POA: Diagnosis not present

## 2022-07-19 DIAGNOSIS — Z9049 Acquired absence of other specified parts of digestive tract: Secondary | ICD-10-CM | POA: Diagnosis not present

## 2022-07-19 DIAGNOSIS — I272 Pulmonary hypertension, unspecified: Secondary | ICD-10-CM | POA: Diagnosis not present

## 2022-07-19 DIAGNOSIS — I2510 Atherosclerotic heart disease of native coronary artery without angina pectoris: Secondary | ICD-10-CM | POA: Diagnosis not present

## 2022-07-19 DIAGNOSIS — I5022 Chronic systolic (congestive) heart failure: Secondary | ICD-10-CM | POA: Diagnosis not present

## 2022-07-19 DIAGNOSIS — I252 Old myocardial infarction: Secondary | ICD-10-CM | POA: Diagnosis not present

## 2022-07-19 DIAGNOSIS — L89151 Pressure ulcer of sacral region, stage 1: Secondary | ICD-10-CM | POA: Diagnosis not present

## 2022-07-19 DIAGNOSIS — I130 Hypertensive heart and chronic kidney disease with heart failure and stage 1 through stage 4 chronic kidney disease, or unspecified chronic kidney disease: Secondary | ICD-10-CM | POA: Diagnosis not present

## 2022-07-19 DIAGNOSIS — Z8673 Personal history of transient ischemic attack (TIA), and cerebral infarction without residual deficits: Secondary | ICD-10-CM | POA: Diagnosis not present

## 2022-07-19 MED ORDER — TRULICITY 0.75 MG/0.5ML ~~LOC~~ SOAJ: 0.7500 mg | SUBCUTANEOUS | 1 refills | Status: DC

## 2022-07-19 NOTE — Progress Notes (Signed)
    SUBJECTIVE:   CHIEF COMPLAINT / HPI:   Type 2 Diabetes Patient is an 81 y.o. female who presents today for diabetes follow-up.  Was scheduled to have shoulder replacement on 8/29 which was cancelled due to elevated A1c. She is highly motivated to reduce her A1c in order to reschedule her shoulder surgery as her shoulder negatively impacts her quality of life.  Home medications include: Jardiance '10mg'$  daily  Patient reports excellent medication compliance with no missed doses. Patient checks sugar at home. Typically range 150-180s fasting. Does not check postprandial No hypoglycemic episodes/symptoms  Most recent A1Cs:  Lab Results  Component Value Date   HGBA1C 8.2 (H) 06/27/2022   HGBA1C 8.0 (A) 12/15/2021   HGBA1C 7.6 (H) 09/07/2021   Last Microalbumin, LDL, Creatinine: Lab Results  Component Value Date   LDLCALC 47 10/18/2021   CREATININE 1.92 (H) 06/27/2022   Patient is not up to date on diabetic eye. Has an appt next month Patient is up to date on diabetic foot exam.  Dietary habits: -no sugar sweetened beverages (1 diet soda daily) -breakfast: always either oatmeal or waffle with sugar free syrup  -premier protein shake twice daily (1g sugar) -meals on wheels for dinner  PERTINENT  PMH / PSH: CKD stage IIIb-IV, CAD, CVA, HFrEF  OBJECTIVE:   BP 133/66   Pulse 69   Ht '5\' 9"'$  (1.753 m)   Wt 205 lb 6.4 oz (93.2 kg)   LMP  (LMP Unknown)   SpO2 100%   BMI 30.33 kg/m   General: NAD, pleasant, able to participate in exam Respiratory: No respiratory distress Skin: warm and dry Psych: Normal affect and mood Neuro: ambulates with walker  ASSESSMENT/PLAN:   DM type 2 with diabetic peripheral neuropathy (HCC) Inadequate control, which is new for her. A1c 8.2%-- goal A1c <8.0. Management options somewhat limited by her renal function. -Continue Jardiance '10mg'$  daily -Start Trulicity 0.'75mg'$  weekly (previously didn't tolerate Ozempic due to GI upset but amenable  to trying Trulicity) -Patient would be good candidate for CGM, will send message to pharmacy staff -Dietary counseling provided, patient will incorporate eggs for breakfast in place of oatmeal/waffles some days   Alcus Dad, MD [place]

## 2022-07-19 NOTE — Patient Instructions (Signed)
It was great to see you!  We are going to start an injection medication called Trulicity. Take it once a week. This has less GI side effects than Ozempic.  I'd also recommend STOPPING your Colace.  Our pharmacy team will reach out to you about a continuous glucose monitor so we can keep a close eye on your sugar and get your A1c low enough for surgery!   Take care, Dr Rock Nephew

## 2022-07-20 NOTE — Assessment & Plan Note (Signed)
Inadequate control, which is new for her. A1c 8.2%-- goal A1c <8.0. Management options somewhat limited by her renal function. -Continue Jardiance '10mg'$  daily -Start Trulicity 0.'75mg'$  weekly (previously didn't tolerate Ozempic due to GI upset but amenable to trying Trulicity) -Patient would be good candidate for CGM, will send message to pharmacy staff -Dietary counseling provided, patient will incorporate eggs for breakfast in place of oatmeal/waffles some days

## 2022-07-24 DIAGNOSIS — M19011 Primary osteoarthritis, right shoulder: Secondary | ICD-10-CM | POA: Diagnosis not present

## 2022-07-24 DIAGNOSIS — I252 Old myocardial infarction: Secondary | ICD-10-CM | POA: Diagnosis not present

## 2022-07-24 DIAGNOSIS — Z9181 History of falling: Secondary | ICD-10-CM | POA: Diagnosis not present

## 2022-07-24 DIAGNOSIS — I130 Hypertensive heart and chronic kidney disease with heart failure and stage 1 through stage 4 chronic kidney disease, or unspecified chronic kidney disease: Secondary | ICD-10-CM | POA: Diagnosis not present

## 2022-07-24 DIAGNOSIS — L89151 Pressure ulcer of sacral region, stage 1: Secondary | ICD-10-CM | POA: Diagnosis not present

## 2022-07-24 DIAGNOSIS — I2510 Atherosclerotic heart disease of native coronary artery without angina pectoris: Secondary | ICD-10-CM | POA: Diagnosis not present

## 2022-07-24 DIAGNOSIS — Z9049 Acquired absence of other specified parts of digestive tract: Secondary | ICD-10-CM | POA: Diagnosis not present

## 2022-07-24 DIAGNOSIS — N189 Chronic kidney disease, unspecified: Secondary | ICD-10-CM | POA: Diagnosis not present

## 2022-07-24 DIAGNOSIS — I5022 Chronic systolic (congestive) heart failure: Secondary | ICD-10-CM | POA: Diagnosis not present

## 2022-07-24 DIAGNOSIS — G4733 Obstructive sleep apnea (adult) (pediatric): Secondary | ICD-10-CM | POA: Diagnosis not present

## 2022-07-24 DIAGNOSIS — Z96652 Presence of left artificial knee joint: Secondary | ICD-10-CM | POA: Diagnosis not present

## 2022-07-24 DIAGNOSIS — E1122 Type 2 diabetes mellitus with diabetic chronic kidney disease: Secondary | ICD-10-CM | POA: Diagnosis not present

## 2022-07-24 DIAGNOSIS — I272 Pulmonary hypertension, unspecified: Secondary | ICD-10-CM | POA: Diagnosis not present

## 2022-07-24 DIAGNOSIS — Z7984 Long term (current) use of oral hypoglycemic drugs: Secondary | ICD-10-CM | POA: Diagnosis not present

## 2022-07-24 DIAGNOSIS — E1142 Type 2 diabetes mellitus with diabetic polyneuropathy: Secondary | ICD-10-CM | POA: Diagnosis not present

## 2022-07-24 DIAGNOSIS — Z8673 Personal history of transient ischemic attack (TIA), and cerebral infarction without residual deficits: Secondary | ICD-10-CM | POA: Diagnosis not present

## 2022-07-24 DIAGNOSIS — I480 Paroxysmal atrial fibrillation: Secondary | ICD-10-CM | POA: Diagnosis not present

## 2022-07-24 DIAGNOSIS — Z951 Presence of aortocoronary bypass graft: Secondary | ICD-10-CM | POA: Diagnosis not present

## 2022-07-25 ENCOUNTER — Telehealth: Payer: Self-pay | Admitting: Pharmacist

## 2022-07-25 NOTE — Telephone Encounter (Signed)
Noted and agree. 

## 2022-07-25 NOTE — Telephone Encounter (Signed)
Contacted patient to assess tolerability and control of blood sugar following initiation of Trulicity (dulaglutide).   Patient reports she is tolerating "OK"  - no major side effects.   She also reports improved blood sugar control from ~ 200 and high 100s to  Fasting readings of 120-150 and later day readings ~ 150-160.   She denies any history of symptomatic low blood sugars and denies symptoms or any low readings (<68) with Trulicity.   We discussed CGM, past use of Insulin, and timing of follow-up to get next A1c.  We discussed timing of A1c would be reasonable in 1-2 months, however, given her current readings and pending reschedule of surgery, I would strongly consider recheck at her follow-up on Monday October 2nd with Dr. Rock Nephew.    I scheduled the early October visit for patient in order for her to get her vaccinations.  Consider FLU, RSV and Covid at that time.    Patient not indicated for insulin or a candidate for CGM at this time.

## 2022-07-26 DIAGNOSIS — M19011 Primary osteoarthritis, right shoulder: Secondary | ICD-10-CM | POA: Diagnosis not present

## 2022-07-26 DIAGNOSIS — Z951 Presence of aortocoronary bypass graft: Secondary | ICD-10-CM | POA: Diagnosis not present

## 2022-07-26 DIAGNOSIS — E1142 Type 2 diabetes mellitus with diabetic polyneuropathy: Secondary | ICD-10-CM | POA: Diagnosis not present

## 2022-07-26 DIAGNOSIS — I5022 Chronic systolic (congestive) heart failure: Secondary | ICD-10-CM | POA: Diagnosis not present

## 2022-07-26 DIAGNOSIS — Z9049 Acquired absence of other specified parts of digestive tract: Secondary | ICD-10-CM | POA: Diagnosis not present

## 2022-07-26 DIAGNOSIS — Z9181 History of falling: Secondary | ICD-10-CM | POA: Diagnosis not present

## 2022-07-26 DIAGNOSIS — I130 Hypertensive heart and chronic kidney disease with heart failure and stage 1 through stage 4 chronic kidney disease, or unspecified chronic kidney disease: Secondary | ICD-10-CM | POA: Diagnosis not present

## 2022-07-26 DIAGNOSIS — I272 Pulmonary hypertension, unspecified: Secondary | ICD-10-CM | POA: Diagnosis not present

## 2022-07-26 DIAGNOSIS — Z7984 Long term (current) use of oral hypoglycemic drugs: Secondary | ICD-10-CM | POA: Diagnosis not present

## 2022-07-26 DIAGNOSIS — G4733 Obstructive sleep apnea (adult) (pediatric): Secondary | ICD-10-CM | POA: Diagnosis not present

## 2022-07-26 DIAGNOSIS — I480 Paroxysmal atrial fibrillation: Secondary | ICD-10-CM | POA: Diagnosis not present

## 2022-07-26 DIAGNOSIS — I252 Old myocardial infarction: Secondary | ICD-10-CM | POA: Diagnosis not present

## 2022-07-26 DIAGNOSIS — Z96652 Presence of left artificial knee joint: Secondary | ICD-10-CM | POA: Diagnosis not present

## 2022-07-26 DIAGNOSIS — E1122 Type 2 diabetes mellitus with diabetic chronic kidney disease: Secondary | ICD-10-CM | POA: Diagnosis not present

## 2022-07-26 DIAGNOSIS — Z8673 Personal history of transient ischemic attack (TIA), and cerebral infarction without residual deficits: Secondary | ICD-10-CM | POA: Diagnosis not present

## 2022-07-26 DIAGNOSIS — N189 Chronic kidney disease, unspecified: Secondary | ICD-10-CM | POA: Diagnosis not present

## 2022-07-26 DIAGNOSIS — I2510 Atherosclerotic heart disease of native coronary artery without angina pectoris: Secondary | ICD-10-CM | POA: Diagnosis not present

## 2022-07-26 DIAGNOSIS — L89151 Pressure ulcer of sacral region, stage 1: Secondary | ICD-10-CM | POA: Diagnosis not present

## 2022-07-31 ENCOUNTER — Encounter (INDEPENDENT_AMBULATORY_CARE_PROVIDER_SITE_OTHER): Payer: Self-pay | Admitting: Ophthalmology

## 2022-07-31 ENCOUNTER — Encounter (INDEPENDENT_AMBULATORY_CARE_PROVIDER_SITE_OTHER): Payer: Medicare HMO | Admitting: Ophthalmology

## 2022-07-31 ENCOUNTER — Ambulatory Visit (INDEPENDENT_AMBULATORY_CARE_PROVIDER_SITE_OTHER): Payer: Medicare HMO | Admitting: Ophthalmology

## 2022-07-31 DIAGNOSIS — Z9049 Acquired absence of other specified parts of digestive tract: Secondary | ICD-10-CM | POA: Diagnosis not present

## 2022-07-31 DIAGNOSIS — E1122 Type 2 diabetes mellitus with diabetic chronic kidney disease: Secondary | ICD-10-CM | POA: Diagnosis not present

## 2022-07-31 DIAGNOSIS — Z96652 Presence of left artificial knee joint: Secondary | ICD-10-CM | POA: Diagnosis not present

## 2022-07-31 DIAGNOSIS — I252 Old myocardial infarction: Secondary | ICD-10-CM | POA: Diagnosis not present

## 2022-07-31 DIAGNOSIS — Z9181 History of falling: Secondary | ICD-10-CM | POA: Diagnosis not present

## 2022-07-31 DIAGNOSIS — G4733 Obstructive sleep apnea (adult) (pediatric): Secondary | ICD-10-CM | POA: Diagnosis not present

## 2022-07-31 DIAGNOSIS — E1142 Type 2 diabetes mellitus with diabetic polyneuropathy: Secondary | ICD-10-CM | POA: Diagnosis not present

## 2022-07-31 DIAGNOSIS — Z7984 Long term (current) use of oral hypoglycemic drugs: Secondary | ICD-10-CM | POA: Diagnosis not present

## 2022-07-31 DIAGNOSIS — M19011 Primary osteoarthritis, right shoulder: Secondary | ICD-10-CM | POA: Diagnosis not present

## 2022-07-31 DIAGNOSIS — I5022 Chronic systolic (congestive) heart failure: Secondary | ICD-10-CM | POA: Diagnosis not present

## 2022-07-31 DIAGNOSIS — I2510 Atherosclerotic heart disease of native coronary artery without angina pectoris: Secondary | ICD-10-CM | POA: Diagnosis not present

## 2022-07-31 DIAGNOSIS — I130 Hypertensive heart and chronic kidney disease with heart failure and stage 1 through stage 4 chronic kidney disease, or unspecified chronic kidney disease: Secondary | ICD-10-CM | POA: Diagnosis not present

## 2022-07-31 DIAGNOSIS — E113492 Type 2 diabetes mellitus with severe nonproliferative diabetic retinopathy without macular edema, left eye: Secondary | ICD-10-CM | POA: Diagnosis not present

## 2022-07-31 DIAGNOSIS — H353114 Nonexudative age-related macular degeneration, right eye, advanced atrophic with subfoveal involvement: Secondary | ICD-10-CM | POA: Diagnosis not present

## 2022-07-31 DIAGNOSIS — H353212 Exudative age-related macular degeneration, right eye, with inactive choroidal neovascularization: Secondary | ICD-10-CM

## 2022-07-31 DIAGNOSIS — E113491 Type 2 diabetes mellitus with severe nonproliferative diabetic retinopathy without macular edema, right eye: Secondary | ICD-10-CM | POA: Diagnosis not present

## 2022-07-31 DIAGNOSIS — Z8673 Personal history of transient ischemic attack (TIA), and cerebral infarction without residual deficits: Secondary | ICD-10-CM | POA: Diagnosis not present

## 2022-07-31 DIAGNOSIS — N189 Chronic kidney disease, unspecified: Secondary | ICD-10-CM | POA: Diagnosis not present

## 2022-07-31 DIAGNOSIS — L89151 Pressure ulcer of sacral region, stage 1: Secondary | ICD-10-CM | POA: Diagnosis not present

## 2022-07-31 DIAGNOSIS — I272 Pulmonary hypertension, unspecified: Secondary | ICD-10-CM | POA: Diagnosis not present

## 2022-07-31 DIAGNOSIS — Z951 Presence of aortocoronary bypass graft: Secondary | ICD-10-CM | POA: Diagnosis not present

## 2022-07-31 DIAGNOSIS — I480 Paroxysmal atrial fibrillation: Secondary | ICD-10-CM | POA: Diagnosis not present

## 2022-07-31 NOTE — Assessment & Plan Note (Signed)
Accounts for acute TOD.  No specific be warranted.

## 2022-07-31 NOTE — Assessment & Plan Note (Addendum)
>>  ASSESSMENT AND PLAN FOR SEVERE NONPROLIFERATIVE DIABETIC RETINOPATHY OF BOTH EYES (HCC) WRITTEN ON 07/31/2022  4:11 PM BY Fawn Kirk A, MD  Stable OD with good peripheral PRP  >>ASSESSMENT AND PLAN FOR SEVERE NONPROLIFERATIVE DIABETIC RETINOPATHY OF LEFT EYE (HCC) WRITTEN ON 07/31/2022  4:11 PM BY Fawn Kirk A, MD  No progression of PDR.  No active CSME threatening acuity in fact improved

## 2022-07-31 NOTE — Assessment & Plan Note (Signed)
No progression of PDR.  No active CSME threatening acuity in fact improved

## 2022-07-31 NOTE — Assessment & Plan Note (Signed)
No active CNVM in fact foveal atrophy no OD

## 2022-07-31 NOTE — Progress Notes (Signed)
07/31/2022     CHIEF COMPLAINT Patient presents for  Chief Complaint  Patient presents with   Diabetic Retinopathy without Macular Edema      HISTORY OF PRESENT ILLNESS: [name] is a 81 y.o. female who presents to the clinic today for:   HPI   4 MOS FOR DILATE OU, COLOR FP, OCT. Pt stated vision has improved since last visit.  Pts blood sugar was 119.  Last edited by Silvestre Moment on 07/31/2022  3:36 PM.      Referring physician: Alcus Dad, MD [place],  [place] [address]  HISTORICAL INFORMATION:   Selected notes from the MEDICAL RECORD NUMBER    Lab Results  Component Value Date   HGBA1C 8.2 (H) 06/27/2022     CURRENT MEDICATIONS: No current outpatient medications on file. (Ophthalmic Drugs)   No current facility-administered medications for this visit. (Ophthalmic Drugs)   Current Outpatient Medications (Other)  Medication Sig   acetaminophen (TYLENOL 8 HOUR) 650 MG CR tablet Take 1 tablet (650 mg total) by mouth every 8 (eight) hours as needed for pain.   apixaban (ELIQUIS) 2.5 MG TABS tablet Take 1 tablet (2.5 mg total) by mouth 2 (two) times daily.   atorvastatin (LIPITOR) 40 MG tablet TAKE 1 TABLET DAILY   Blood Glucose Monitoring Suppl (ONE TOUCH ULTRA 2) w/Device KIT USE TO CHECK BLOOD SUGAR UPTO 3 TIMES A DAY   carvedilol (COREG) 6.25 MG tablet TAKE 1 TABLET TWICE A DAY  WITH MEALS   Cholecalciferol 25 MCG (1000 UT) tablet Take 1,000 Units by mouth daily.   docusate sodium (COLACE) 100 MG capsule Take 100 mg by mouth at bedtime.   Dulaglutide (TRULICITY) 7.89 FY/1.0FB SOPN Inject 0.75 mg into the skin once a week.   DULoxetine (CYMBALTA) 20 MG capsule TAKE 1 CAPSULE DAILY   ferrous sulfate 325 (65 FE) MG tablet TAKE 1 TABLET BY MOUTH EVERY DAY WITH BREAKFAST   glucose blood (ONE TOUCH ULTRA TEST) test strip Use three times a day   JARDIANCE 10 MG TABS tablet TAKE 1 TABLET DAILY BEFORE BREAKFAST   Lancets (ONETOUCH DELICA PLUS  [id number]) MISC 1 application by Does not apply route daily. Please use to check blood sugar up to three times daily.   metolazone (ZAROXOLYN) 2.5 MG tablet Take only as directed by CHF clinic (Patient taking differently: Take 2.5 mg by mouth daily as needed. Take only as directed by CHF clinic)   Multiple Vitamins-Minerals (PRESERVISION AREDS 2+MULTI VIT PO) Take 1 capsule by mouth in the morning and at bedtime.   nitroGLYCERIN (NITROSTAT) 0.4 MG SL tablet PLACE 1 TABLET (0.4 MG TOTAL) UNDER THE TONGUE EVERY 5 (FIVE) MINUTES AS NEEDED FOR CHEST PAIN (UP TO 3 DOSES).   potassium chloride SA (KLOR-CON M20) 20 MEQ tablet Take 3 tablets (60 mEq total) by mouth 2 (two) times daily.   rOPINIRole (REQUIP) 0.5 MG tablet TAKE 2 TABLETS AT BEDTIME   spironolactone (ALDACTONE) 25 MG tablet Take 0.5 tablets (12.5 mg total) by mouth daily.   torsemide (DEMADEX) 20 MG tablet Take 3 tablets (60 mg total) by mouth 2 (two) times daily.   vitamin B-12 (CYANOCOBALAMIN) 1000 MCG tablet Take 1 tablet (1,000 mcg total) by mouth daily.   zinc oxide 20 % ointment Apply 1 application topically as needed (buttocks irritation).   No current facility-administered medications for this visit. (Other)      REVIEW OF SYSTEMS: ROS   Negative for: Constitutional, Gastrointestinal,  Neurological, Skin, Genitourinary, Musculoskeletal, HENT, Endocrine, Cardiovascular, Eyes, Respiratory, Psychiatric, Allergic/Imm, Heme/Lymph Last edited by Silvestre Moment on 07/31/2022  3:36 PM.       ALLERGIES Allergies  Allergen Reactions   Benazepril Other (See Comments)    Unknown reaction at age 47-65 - possibly dizziness   Cymbalta [Duloxetine Hcl]     Itch, Dry mouth    Ozempic (0.25 Or 0.5 Mg-Dose) [Semaglutide(0.25 Or 0.51m-Dos)] Diarrhea    Gi intollerance   Tape     TOLERATES PAPER TAPE ONLY- due to thin skin. NOT ALLERGY PER PT   Angiotensin Receptor Blockers Other (See Comments)    Hypotension reaction   Bactrim  [Sulfamethoxazole-Trimethoprim] Other (See Comments)    Unknown reaction   Fe-Succ-C-Thre-B12-Des Stomach Other (See Comments)    Unknown reaction  Ferocon?    PAST MEDICAL HISTORY Past Medical History:  Diagnosis Date   Acute cystitis with hematuria 09/01/2020   ANXIETY 01/31/2009   Qualifier: Diagnosis of  By: TLovette Cliche CNA, Christy     Arthritis    "qwhere" (03/30/2016)   Bilateral carpal tunnel syndrome 02/13/2021   CAD (coronary artery disease) 05/2008   a. s/p CABG in 2009.   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 Postoperative in 2009;  S/P transesophageal echocardiography-guided cardioversion, previously on Amiodarone and Coumadin. b. recurrent in April 2013 -> pt elected rate control strategy, initially Coumadin -> had stroke with subtherapeutic INR in 03/2016 and transitioned to Eliquis.   Macular degeneration    Melena 01/10/2021   Morbid obesity ([place] 03/26/2017   OSA on CPAP    severe OSA with AHI 34/hr   Proliferative diabetic retinopathy of right eye ([facility] 04/17/2021   Found today at delivery of PRP for what was thought to be severe NPDR OD   Rectal bleeding    Thrombocytopenia ([facility]    Past Surgical History:   Procedure Laterality Date   APPENDECTOMY  1946   BACK SURGERY     CARPAL TUNNEL RELEASE Left 03/28/2021   Procedure: LEFT CARPAL TUNNEL RELEASE;  Surgeon: KDaryll Brod MD;  [place] [facility]  Service: Orthopedics;  Laterality: Left;  60 MIN   CARPAL TUNNEL RELEASE Right 09/15/2021   Procedure: RIGHT CARPAL TUNNEL RELEASE;  Surgeon: KDaryll Brod MD;  [place] [facility]  Service: Orthopedics;  Laterality: Right;   CATARACT EXTRACTION Right 2012   COLONOSCOPY WITH PROPOFOL Left 03/27/2017   Procedure: COLONOSCOPY WITH PROPOFOL;  Surgeon: KRonnette Juniper MD;  [place] [facility]  Service: Gastroenterology;  Laterality: Left;   CORONARY ANGIOPLASTY WITH STENT PLACEMENT  2009   "put 2 stents in"   EXCISION METACARPAL MASS Left 03/28/2021   Procedure: EXCISION MASS LEFT THUMB;  Surgeon: KDaryll Brod MD;  Location: [facility];  Service: Orthopedics;  Laterality: Left;   EYE SURGERY     bilateral cataract   HERNIA REPAIR     IR LUMBAR DISC ASPIRATION W/IMG GUIDE  10/08/2018   JOINT REPLACEMENT     TEE WITH CARDIOVERSION     Postoperative in 2009;  S/P transesophageal echocardiography-guided cardioversion,   TOTAL KNEE ARTHROPLASTY Right 1994   VENTRAL HERNIA REPAIR  10/1999   With mesh    FAMILY HISTORY Family History  Problem Relation Age of Onset   Clotting disorder Mother        Cerebral hemorrhage   Other Mother        cerebral hemorrhage   Emphysema Father        COD   Lung cancer Brother    Heart disease Neg Hx     SOCIAL HISTORY Social History   Tobacco Use   Smoking status: Never   Smokeless tobacco: Never  Vaping Use   Vaping Use: Never used  Substance Use Topics   Alcohol use: No   Drug use: Never         OPHTHALMIC EXAM:  Base Eye Exam     Visual Acuity (ETDRS)       Right Left   Dist cc 20/150 -1 20/40 -1   Dist ph cc 20/100 -2 20/30 -1    Correction: Glasses         Tonometry (Tonopen, 3:44 PM)       Right Left   Pressure 15 14         Pupils        Pupils APD   Right PERRL None   Left PERRL None         Visual Fields       Left Right    Full Full         Extraocular Movement       Right Left    Full, Ortho Full, Ortho         Neuro/Psych     Oriented x3: Yes   Mood/Affect: Normal         Dilation     Both eyes: 1.0% Mydriacyl, 2.5% Phenylephrine @ 3:44 PM           Slit Lamp and Fundus Exam     External Exam       Right Left   External Normal Normal         Slit Lamp Exam       Right Left   Lids/Lashes Normal Normal   Conjunctiva/Sclera White and quiet White and quiet   Cornea Clear Clear   Anterior Chamber Deep and quiet Deep and quiet   Iris Round and reactive Round and reactive   Lens Posterior chamber intraocular lens Posterior chamber intraocular lens   Anterior Vitreous Normal Normal         Fundus Exam       Right Left   Posterior Vitreous Posterior vitreous detachment small vitreous hemorrhage superiorly, Vitreous hemorrhage Partial posterior vitreous detachment   Disc Normal Normal   C/D Ratio 0.7 0.7   Macula Soft drusen, Retinal pigment epithelial mottling, no macular thickening, geographic atrophy superior portion of the macula Intermediate age related macular degeneration, Hard drusen, Pigmented atrophy, Mild clinically significant macular edema inferotemporal   Vessels NPDR-Severe, with arm inferiorly NPDR-Severe   Periphery Moderate scatter PRP Moderate scatter PRP            IMAGING AND PROCEDURES  Imaging  and Procedures for 07/31/22  OCT, Retina - OU - Both Eyes       Right Eye Quality was good. Scan locations included subfoveal. Central Foveal Thickness: 211. Progression has been stable. Findings include abnormal foveal contour.   Left Eye Quality was good. Scan locations included subfoveal. Central Foveal Thickness: 291. Progression has improved. Findings include abnormal foveal contour, vitreous traction, vitreomacular adhesion .   Notes  OD  with no active CSME last injection Avastin some 17 months previous.  No active CSME Bilateral severe NPDR treatment of peripheral PRP may have decrease vegF production.    OS, with minor CSME inferotemporal will observe good acuity and less thickening today        Color Fundus Photography Optos - OU - Both Eyes       Right Eye Progression has been stable. Disc findings include normal observations. Macula : drusen, geographic atrophy, microaneurysms.   Left Eye Progression has been stable. Disc findings include normal observations. Macula : drusen, microaneurysms.   Notes Good PRP OD nasally clear media.  Areas of extensive retinal nonperfusion temporally and superiorly PRP for severe NPDR now with good PRP OS with very severe NPDR clear media, early PRP present             ASSESSMENT/PLAN:  Exudative age-related macular degeneration of right eye with inactive choroidal neovascularization (HCC) No active CNVM in fact foveal atrophy no OD  Advanced nonexudative age-related macular degeneration of right eye with subfoveal involvement Accounts for acute TOD.  No specific be warranted.  Severe nonproliferative diabetic retinopathy of left eye (HCC) No progression of PDR.  No active CSME threatening acuity in fact improved  Severe nonproliferative diabetic retinopathy of right eye (HCC) Stable OD with good peripheral PRP     ICD-10-CM   1. Severe nonproliferative diabetic retinopathy of left eye without macular edema associated with type 2 diabetes mellitus (HCC)  L46.5035 OCT, Retina - OU - Both Eyes    Color Fundus Photography Optos - OU - Both Eyes    2. Exudative age-related macular degeneration of right eye with inactive choroidal neovascularization ([place])  H35.3212     3. Advanced nonexudative age-related macular degeneration of right eye with subfoveal involvement  H35.3114     4. Severe nonproliferative diabetic retinopathy of right eye without macular edema  associated with type 2 diabetes mellitus ([place])  E11.3491       1.  Severe NPDR OU stable over time.  2.  Intermediate ARMD OS, no sign of CNVM, no sign of recurrences of CNVM OD.  3.  OD foveal atrophy accounts for acuity  Ophthalmic Meds Ordered this visit:  No orders of the defined types were placed in this encounter.      Return in about 6 months (around 01/29/2023) for DILATE OU, COLOR FP.  There are no Patient Instructions on file for this visit.   Explained the diagnoses, plan, and follow up with the patient and they expressed understanding.  Patient expressed understanding of the importance of proper follow up care.   Clent Demark Sakari Alkhatib M.D. Diseases & Surgery of the Retina and Vitreous Retina & Diabetic [place] 07/31/22     Abbreviations: M myopia (nearsighted); A astigmatism; H hyperopia (farsighted); P presbyopia; Mrx spectacle prescription;  CTL contact lenses; OD right eye; OS left eye; OU both eyes  XT exotropia; ET esotropia; PEK punctate epithelial keratitis; PEE punctate epithelial erosions; DES dry eye syndrome; MGD meibomian gland dysfunction; ATs artificial tears; PFAT's preservative  free artificial tears; [place] nuclear sclerotic cataract; PSC posterior subcapsular cataract; ERM epi-retinal membrane; PVD posterior vitreous detachment; RD retinal detachment; DM diabetes mellitus; DR diabetic retinopathy; NPDR non-proliferative diabetic retinopathy; PDR proliferative diabetic retinopathy; CSME clinically significant macular edema; DME diabetic macular edema; dbh dot blot hemorrhages; CWS cotton wool spot; POAG primary open angle glaucoma; C/D cup-to-disc ratio; HVF humphrey visual field; GVF goldmann visual field; OCT optical coherence tomography; IOP intraocular pressure; BRVO Branch retinal vein occlusion; CRVO central retinal vein occlusion; CRAO central retinal artery occlusion; BRAO branch retinal artery occlusion; RT retinal tear; SB scleral buckle; PPV pars plana  vitrectomy; VH Vitreous hemorrhage; PRP panretinal laser photocoagulation; IVK intravitreal kenalog; VMT vitreomacular traction; MH Macular hole;  NVD neovascularization of the disc; NVE neovascularization elsewhere; AREDS age related eye disease study; ARMD age related macular degeneration; POAG primary open angle glaucoma; EBMD epithelial/anterior basement membrane dystrophy; ACIOL anterior chamber intraocular lens; IOL intraocular lens; PCIOL posterior chamber intraocular lens; Phaco/IOL phacoemulsification with intraocular lens placement; [place] photorefractive keratectomy; LASIK laser assisted in situ keratomileusis; HTN hypertension; DM diabetes mellitus; COPD chronic obstructive pulmonary disease

## 2022-08-02 DIAGNOSIS — N189 Chronic kidney disease, unspecified: Secondary | ICD-10-CM | POA: Diagnosis not present

## 2022-08-02 DIAGNOSIS — Z9181 History of falling: Secondary | ICD-10-CM | POA: Diagnosis not present

## 2022-08-02 DIAGNOSIS — E1122 Type 2 diabetes mellitus with diabetic chronic kidney disease: Secondary | ICD-10-CM | POA: Diagnosis not present

## 2022-08-02 DIAGNOSIS — Z96652 Presence of left artificial knee joint: Secondary | ICD-10-CM | POA: Diagnosis not present

## 2022-08-02 DIAGNOSIS — I252 Old myocardial infarction: Secondary | ICD-10-CM | POA: Diagnosis not present

## 2022-08-02 DIAGNOSIS — G4733 Obstructive sleep apnea (adult) (pediatric): Secondary | ICD-10-CM | POA: Diagnosis not present

## 2022-08-02 DIAGNOSIS — I272 Pulmonary hypertension, unspecified: Secondary | ICD-10-CM | POA: Diagnosis not present

## 2022-08-02 DIAGNOSIS — E1142 Type 2 diabetes mellitus with diabetic polyneuropathy: Secondary | ICD-10-CM | POA: Diagnosis not present

## 2022-08-02 DIAGNOSIS — I2510 Atherosclerotic heart disease of native coronary artery without angina pectoris: Secondary | ICD-10-CM | POA: Diagnosis not present

## 2022-08-02 DIAGNOSIS — I480 Paroxysmal atrial fibrillation: Secondary | ICD-10-CM | POA: Diagnosis not present

## 2022-08-02 DIAGNOSIS — I5022 Chronic systolic (congestive) heart failure: Secondary | ICD-10-CM | POA: Diagnosis not present

## 2022-08-02 DIAGNOSIS — Z951 Presence of aortocoronary bypass graft: Secondary | ICD-10-CM | POA: Diagnosis not present

## 2022-08-02 DIAGNOSIS — L89151 Pressure ulcer of sacral region, stage 1: Secondary | ICD-10-CM | POA: Diagnosis not present

## 2022-08-02 DIAGNOSIS — Z8673 Personal history of transient ischemic attack (TIA), and cerebral infarction without residual deficits: Secondary | ICD-10-CM | POA: Diagnosis not present

## 2022-08-02 DIAGNOSIS — M19011 Primary osteoarthritis, right shoulder: Secondary | ICD-10-CM | POA: Diagnosis not present

## 2022-08-02 DIAGNOSIS — I130 Hypertensive heart and chronic kidney disease with heart failure and stage 1 through stage 4 chronic kidney disease, or unspecified chronic kidney disease: Secondary | ICD-10-CM | POA: Diagnosis not present

## 2022-08-02 DIAGNOSIS — Z7984 Long term (current) use of oral hypoglycemic drugs: Secondary | ICD-10-CM | POA: Diagnosis not present

## 2022-08-02 DIAGNOSIS — Z9049 Acquired absence of other specified parts of digestive tract: Secondary | ICD-10-CM | POA: Diagnosis not present

## 2022-08-07 DIAGNOSIS — I480 Paroxysmal atrial fibrillation: Secondary | ICD-10-CM | POA: Diagnosis not present

## 2022-08-07 DIAGNOSIS — I252 Old myocardial infarction: Secondary | ICD-10-CM | POA: Diagnosis not present

## 2022-08-07 DIAGNOSIS — L89151 Pressure ulcer of sacral region, stage 1: Secondary | ICD-10-CM | POA: Diagnosis not present

## 2022-08-07 DIAGNOSIS — E1142 Type 2 diabetes mellitus with diabetic polyneuropathy: Secondary | ICD-10-CM | POA: Diagnosis not present

## 2022-08-07 DIAGNOSIS — M19011 Primary osteoarthritis, right shoulder: Secondary | ICD-10-CM | POA: Diagnosis not present

## 2022-08-07 DIAGNOSIS — Z7984 Long term (current) use of oral hypoglycemic drugs: Secondary | ICD-10-CM | POA: Diagnosis not present

## 2022-08-07 DIAGNOSIS — Z9181 History of falling: Secondary | ICD-10-CM | POA: Diagnosis not present

## 2022-08-07 DIAGNOSIS — I2510 Atherosclerotic heart disease of native coronary artery without angina pectoris: Secondary | ICD-10-CM | POA: Diagnosis not present

## 2022-08-07 DIAGNOSIS — N189 Chronic kidney disease, unspecified: Secondary | ICD-10-CM | POA: Diagnosis not present

## 2022-08-07 DIAGNOSIS — G4733 Obstructive sleep apnea (adult) (pediatric): Secondary | ICD-10-CM | POA: Diagnosis not present

## 2022-08-07 DIAGNOSIS — Z951 Presence of aortocoronary bypass graft: Secondary | ICD-10-CM | POA: Diagnosis not present

## 2022-08-07 DIAGNOSIS — E1122 Type 2 diabetes mellitus with diabetic chronic kidney disease: Secondary | ICD-10-CM | POA: Diagnosis not present

## 2022-08-07 DIAGNOSIS — Z96652 Presence of left artificial knee joint: Secondary | ICD-10-CM | POA: Diagnosis not present

## 2022-08-07 DIAGNOSIS — Z9049 Acquired absence of other specified parts of digestive tract: Secondary | ICD-10-CM | POA: Diagnosis not present

## 2022-08-07 DIAGNOSIS — I130 Hypertensive heart and chronic kidney disease with heart failure and stage 1 through stage 4 chronic kidney disease, or unspecified chronic kidney disease: Secondary | ICD-10-CM | POA: Diagnosis not present

## 2022-08-07 DIAGNOSIS — Z8673 Personal history of transient ischemic attack (TIA), and cerebral infarction without residual deficits: Secondary | ICD-10-CM | POA: Diagnosis not present

## 2022-08-07 DIAGNOSIS — I5022 Chronic systolic (congestive) heart failure: Secondary | ICD-10-CM | POA: Diagnosis not present

## 2022-08-07 DIAGNOSIS — I272 Pulmonary hypertension, unspecified: Secondary | ICD-10-CM | POA: Diagnosis not present

## 2022-08-09 DIAGNOSIS — Z8673 Personal history of transient ischemic attack (TIA), and cerebral infarction without residual deficits: Secondary | ICD-10-CM | POA: Diagnosis not present

## 2022-08-09 DIAGNOSIS — Z7984 Long term (current) use of oral hypoglycemic drugs: Secondary | ICD-10-CM | POA: Diagnosis not present

## 2022-08-09 DIAGNOSIS — G4733 Obstructive sleep apnea (adult) (pediatric): Secondary | ICD-10-CM | POA: Diagnosis not present

## 2022-08-09 DIAGNOSIS — M19011 Primary osteoarthritis, right shoulder: Secondary | ICD-10-CM | POA: Diagnosis not present

## 2022-08-09 DIAGNOSIS — I130 Hypertensive heart and chronic kidney disease with heart failure and stage 1 through stage 4 chronic kidney disease, or unspecified chronic kidney disease: Secondary | ICD-10-CM | POA: Diagnosis not present

## 2022-08-09 DIAGNOSIS — Z951 Presence of aortocoronary bypass graft: Secondary | ICD-10-CM | POA: Diagnosis not present

## 2022-08-09 DIAGNOSIS — L89151 Pressure ulcer of sacral region, stage 1: Secondary | ICD-10-CM | POA: Diagnosis not present

## 2022-08-09 DIAGNOSIS — I480 Paroxysmal atrial fibrillation: Secondary | ICD-10-CM | POA: Diagnosis not present

## 2022-08-09 DIAGNOSIS — E1122 Type 2 diabetes mellitus with diabetic chronic kidney disease: Secondary | ICD-10-CM | POA: Diagnosis not present

## 2022-08-09 DIAGNOSIS — I5022 Chronic systolic (congestive) heart failure: Secondary | ICD-10-CM | POA: Diagnosis not present

## 2022-08-09 DIAGNOSIS — Z9181 History of falling: Secondary | ICD-10-CM | POA: Diagnosis not present

## 2022-08-09 DIAGNOSIS — N189 Chronic kidney disease, unspecified: Secondary | ICD-10-CM | POA: Diagnosis not present

## 2022-08-09 DIAGNOSIS — I272 Pulmonary hypertension, unspecified: Secondary | ICD-10-CM | POA: Diagnosis not present

## 2022-08-09 DIAGNOSIS — I252 Old myocardial infarction: Secondary | ICD-10-CM | POA: Diagnosis not present

## 2022-08-09 DIAGNOSIS — Z9049 Acquired absence of other specified parts of digestive tract: Secondary | ICD-10-CM | POA: Diagnosis not present

## 2022-08-09 DIAGNOSIS — E1142 Type 2 diabetes mellitus with diabetic polyneuropathy: Secondary | ICD-10-CM | POA: Diagnosis not present

## 2022-08-09 DIAGNOSIS — Z96652 Presence of left artificial knee joint: Secondary | ICD-10-CM | POA: Diagnosis not present

## 2022-08-09 DIAGNOSIS — I2510 Atherosclerotic heart disease of native coronary artery without angina pectoris: Secondary | ICD-10-CM | POA: Diagnosis not present

## 2022-08-13 ENCOUNTER — Encounter: Payer: Self-pay | Admitting: Family Medicine

## 2022-08-13 ENCOUNTER — Ambulatory Visit (INDEPENDENT_AMBULATORY_CARE_PROVIDER_SITE_OTHER): Payer: Medicare HMO | Admitting: Family Medicine

## 2022-08-13 ENCOUNTER — Other Ambulatory Visit: Payer: Self-pay

## 2022-08-13 VITALS — BP 123/98 | HR 84 | Wt 202.8 lb

## 2022-08-13 DIAGNOSIS — E1142 Type 2 diabetes mellitus with diabetic polyneuropathy: Secondary | ICD-10-CM | POA: Diagnosis not present

## 2022-08-13 DIAGNOSIS — Z23 Encounter for immunization: Secondary | ICD-10-CM | POA: Diagnosis not present

## 2022-08-13 LAB — POCT GLYCOSYLATED HEMOGLOBIN (HGB A1C): HbA1c, POC (controlled diabetic range): 7.3 % — AB (ref 0.0–7.0)

## 2022-08-13 NOTE — Patient Instructions (Addendum)
It was great to see you!   Your A1c was 7.3% today, which means your diabetes is well-controlled. Your goal A1c is less than 8%. Continue your current medications.  Let me know if you are unable to to get your Trulicity for some reason and I can send it to a different pharmacy.  You should be able to reschedule your shoulder surgery now that your A1c is improved.  Let me know if you have any issues with this.  We are checking some kidney numbers via your urine today.  I will only contact you with these results if we need to change anything.  You can expect some arm soreness from your flu and pneumonia shots today.  Take care, Dr. Rock Nephew

## 2022-08-13 NOTE — Assessment & Plan Note (Signed)
Well-controlled and improved from prior.  A1c 7.3% today (goal A1c <8.0%). Home glucose readings in the low 100s. -Continue current meds (Jardiance 10 mg daily, Trulicity 9.23 mg weekly) -Urine microalbumin ordered, but patient unable to leave urine sample today. Recommend obtaining at next visit.

## 2022-08-13 NOTE — Progress Notes (Signed)
    SUBJECTIVE:   CHIEF COMPLAINT / HPI:   Type 2 Diabetes Patient is an 81 y.o. female who presents today for diabetes follow-up. Last seen 1 month ago after her shoulder arthroplasty was cancelled due to elevated A1c (8.2%).  Home medications include: Jardiance '10mg'$  daily, Trulicity 0.'75mg'$  weekly Patient reports excellent medication compliance. Patient checks sugar regularly at home. Typically range low 100s fasting No hypoglycemic episodes/symptoms  Most recent A1Cs:  Lab Results  Component Value Date   HGBA1C 7.3 (A) 08/13/2022   HGBA1C 8.2 (H) 06/27/2022   HGBA1C 8.0 (A) 12/15/2021   Last Microalbumin, LDL, Creatinine: Lab Results  Component Value Date   LDLCALC 47 10/18/2021   CREATININE 1.92 (H) 06/27/2022    Patient is up to date on diabetic eye. Patient is up to date on diabetic foot exam.  PERTINENT  PMH / PSH: HTN, HFrEF, CAD, CKD stage III  OBJECTIVE:   BP (!) 123/98   Pulse 84   Wt 202 lb 12.8 oz (92 kg)   LMP  (LMP Unknown)   SpO2 100%   BMI 29.95 kg/m   Gen: NAD, pleasant, able to participate in exam CV: RRR, normal S1/S2, no murmur Resp: Normal effort, lungs CTAB Extremities: no edema or cyanosis Skin: warm and dry, no rashes noted Neuro: alert, no obvious focal deficits Psych: Normal affect and mood   ASSESSMENT/PLAN:   DM type 2 with diabetic peripheral neuropathy (HCC) Well-controlled and improved from prior.  A1c 7.3% today (goal A1c <8.0%). Home glucose readings in the low 100s. -Continue current meds (Jardiance 10 mg daily, Trulicity 0.92 mg weekly) -Urine microalbumin ordered, but patient unable to leave urine sample today. Recommend obtaining at next visit.   Health maintenance Flu vaccine and PCV 20 administered today  Alcus Dad, MD [place]

## 2022-08-14 DIAGNOSIS — I480 Paroxysmal atrial fibrillation: Secondary | ICD-10-CM | POA: Diagnosis not present

## 2022-08-14 DIAGNOSIS — L89151 Pressure ulcer of sacral region, stage 1: Secondary | ICD-10-CM | POA: Diagnosis not present

## 2022-08-14 DIAGNOSIS — I130 Hypertensive heart and chronic kidney disease with heart failure and stage 1 through stage 4 chronic kidney disease, or unspecified chronic kidney disease: Secondary | ICD-10-CM | POA: Diagnosis not present

## 2022-08-14 DIAGNOSIS — E1142 Type 2 diabetes mellitus with diabetic polyneuropathy: Secondary | ICD-10-CM | POA: Diagnosis not present

## 2022-08-14 DIAGNOSIS — I272 Pulmonary hypertension, unspecified: Secondary | ICD-10-CM | POA: Diagnosis not present

## 2022-08-14 DIAGNOSIS — Z9049 Acquired absence of other specified parts of digestive tract: Secondary | ICD-10-CM | POA: Diagnosis not present

## 2022-08-14 DIAGNOSIS — N189 Chronic kidney disease, unspecified: Secondary | ICD-10-CM | POA: Diagnosis not present

## 2022-08-14 DIAGNOSIS — I5022 Chronic systolic (congestive) heart failure: Secondary | ICD-10-CM | POA: Diagnosis not present

## 2022-08-14 DIAGNOSIS — Z7984 Long term (current) use of oral hypoglycemic drugs: Secondary | ICD-10-CM | POA: Diagnosis not present

## 2022-08-14 DIAGNOSIS — Z8673 Personal history of transient ischemic attack (TIA), and cerebral infarction without residual deficits: Secondary | ICD-10-CM | POA: Diagnosis not present

## 2022-08-14 DIAGNOSIS — M19011 Primary osteoarthritis, right shoulder: Secondary | ICD-10-CM | POA: Diagnosis not present

## 2022-08-14 DIAGNOSIS — G4733 Obstructive sleep apnea (adult) (pediatric): Secondary | ICD-10-CM | POA: Diagnosis not present

## 2022-08-14 DIAGNOSIS — Z951 Presence of aortocoronary bypass graft: Secondary | ICD-10-CM | POA: Diagnosis not present

## 2022-08-14 DIAGNOSIS — Z9181 History of falling: Secondary | ICD-10-CM | POA: Diagnosis not present

## 2022-08-14 DIAGNOSIS — E1122 Type 2 diabetes mellitus with diabetic chronic kidney disease: Secondary | ICD-10-CM | POA: Diagnosis not present

## 2022-08-14 DIAGNOSIS — I252 Old myocardial infarction: Secondary | ICD-10-CM | POA: Diagnosis not present

## 2022-08-14 DIAGNOSIS — Z96652 Presence of left artificial knee joint: Secondary | ICD-10-CM | POA: Diagnosis not present

## 2022-08-14 DIAGNOSIS — I2510 Atherosclerotic heart disease of native coronary artery without angina pectoris: Secondary | ICD-10-CM | POA: Diagnosis not present

## 2022-08-17 ENCOUNTER — Other Ambulatory Visit (HOSPITAL_COMMUNITY): Payer: Self-pay | Admitting: Cardiology

## 2022-08-17 ENCOUNTER — Other Ambulatory Visit: Payer: Self-pay | Admitting: Family Medicine

## 2022-08-21 DIAGNOSIS — G4733 Obstructive sleep apnea (adult) (pediatric): Secondary | ICD-10-CM | POA: Diagnosis not present

## 2022-08-21 DIAGNOSIS — Z8673 Personal history of transient ischemic attack (TIA), and cerebral infarction without residual deficits: Secondary | ICD-10-CM | POA: Diagnosis not present

## 2022-08-21 DIAGNOSIS — L89151 Pressure ulcer of sacral region, stage 1: Secondary | ICD-10-CM | POA: Diagnosis not present

## 2022-08-21 DIAGNOSIS — Z96652 Presence of left artificial knee joint: Secondary | ICD-10-CM | POA: Diagnosis not present

## 2022-08-21 DIAGNOSIS — Z9049 Acquired absence of other specified parts of digestive tract: Secondary | ICD-10-CM | POA: Diagnosis not present

## 2022-08-21 DIAGNOSIS — I480 Paroxysmal atrial fibrillation: Secondary | ICD-10-CM | POA: Diagnosis not present

## 2022-08-21 DIAGNOSIS — Z951 Presence of aortocoronary bypass graft: Secondary | ICD-10-CM | POA: Diagnosis not present

## 2022-08-21 DIAGNOSIS — Z7984 Long term (current) use of oral hypoglycemic drugs: Secondary | ICD-10-CM | POA: Diagnosis not present

## 2022-08-21 DIAGNOSIS — Z9181 History of falling: Secondary | ICD-10-CM | POA: Diagnosis not present

## 2022-08-21 DIAGNOSIS — I252 Old myocardial infarction: Secondary | ICD-10-CM | POA: Diagnosis not present

## 2022-08-21 DIAGNOSIS — I272 Pulmonary hypertension, unspecified: Secondary | ICD-10-CM | POA: Diagnosis not present

## 2022-08-21 DIAGNOSIS — N189 Chronic kidney disease, unspecified: Secondary | ICD-10-CM | POA: Diagnosis not present

## 2022-08-21 DIAGNOSIS — E1142 Type 2 diabetes mellitus with diabetic polyneuropathy: Secondary | ICD-10-CM | POA: Diagnosis not present

## 2022-08-21 DIAGNOSIS — I130 Hypertensive heart and chronic kidney disease with heart failure and stage 1 through stage 4 chronic kidney disease, or unspecified chronic kidney disease: Secondary | ICD-10-CM | POA: Diagnosis not present

## 2022-08-21 DIAGNOSIS — M19011 Primary osteoarthritis, right shoulder: Secondary | ICD-10-CM | POA: Diagnosis not present

## 2022-08-21 DIAGNOSIS — I5022 Chronic systolic (congestive) heart failure: Secondary | ICD-10-CM | POA: Diagnosis not present

## 2022-08-21 DIAGNOSIS — I2510 Atherosclerotic heart disease of native coronary artery without angina pectoris: Secondary | ICD-10-CM | POA: Diagnosis not present

## 2022-08-21 DIAGNOSIS — E1122 Type 2 diabetes mellitus with diabetic chronic kidney disease: Secondary | ICD-10-CM | POA: Diagnosis not present

## 2022-08-24 ENCOUNTER — Other Ambulatory Visit: Payer: Self-pay | Admitting: Family Medicine

## 2022-08-28 ENCOUNTER — Telehealth: Payer: Self-pay

## 2022-08-28 DIAGNOSIS — Z9181 History of falling: Secondary | ICD-10-CM | POA: Diagnosis not present

## 2022-08-28 DIAGNOSIS — Z96652 Presence of left artificial knee joint: Secondary | ICD-10-CM | POA: Diagnosis not present

## 2022-08-28 DIAGNOSIS — I5022 Chronic systolic (congestive) heart failure: Secondary | ICD-10-CM | POA: Diagnosis not present

## 2022-08-28 DIAGNOSIS — I2510 Atherosclerotic heart disease of native coronary artery without angina pectoris: Secondary | ICD-10-CM | POA: Diagnosis not present

## 2022-08-28 DIAGNOSIS — Z8673 Personal history of transient ischemic attack (TIA), and cerebral infarction without residual deficits: Secondary | ICD-10-CM | POA: Diagnosis not present

## 2022-08-28 DIAGNOSIS — L89151 Pressure ulcer of sacral region, stage 1: Secondary | ICD-10-CM | POA: Diagnosis not present

## 2022-08-28 DIAGNOSIS — I272 Pulmonary hypertension, unspecified: Secondary | ICD-10-CM | POA: Diagnosis not present

## 2022-08-28 DIAGNOSIS — Z951 Presence of aortocoronary bypass graft: Secondary | ICD-10-CM | POA: Diagnosis not present

## 2022-08-28 DIAGNOSIS — Z7984 Long term (current) use of oral hypoglycemic drugs: Secondary | ICD-10-CM | POA: Diagnosis not present

## 2022-08-28 DIAGNOSIS — M19011 Primary osteoarthritis, right shoulder: Secondary | ICD-10-CM | POA: Diagnosis not present

## 2022-08-28 DIAGNOSIS — I130 Hypertensive heart and chronic kidney disease with heart failure and stage 1 through stage 4 chronic kidney disease, or unspecified chronic kidney disease: Secondary | ICD-10-CM | POA: Diagnosis not present

## 2022-08-28 DIAGNOSIS — G4733 Obstructive sleep apnea (adult) (pediatric): Secondary | ICD-10-CM | POA: Diagnosis not present

## 2022-08-28 DIAGNOSIS — E1122 Type 2 diabetes mellitus with diabetic chronic kidney disease: Secondary | ICD-10-CM | POA: Diagnosis not present

## 2022-08-28 DIAGNOSIS — E1142 Type 2 diabetes mellitus with diabetic polyneuropathy: Secondary | ICD-10-CM | POA: Diagnosis not present

## 2022-08-28 DIAGNOSIS — I252 Old myocardial infarction: Secondary | ICD-10-CM | POA: Diagnosis not present

## 2022-08-28 DIAGNOSIS — N189 Chronic kidney disease, unspecified: Secondary | ICD-10-CM | POA: Diagnosis not present

## 2022-08-28 DIAGNOSIS — Z9049 Acquired absence of other specified parts of digestive tract: Secondary | ICD-10-CM | POA: Diagnosis not present

## 2022-08-28 DIAGNOSIS — I480 Paroxysmal atrial fibrillation: Secondary | ICD-10-CM | POA: Diagnosis not present

## 2022-08-28 NOTE — Patient Outreach (Signed)
  Care Coordination   08/28/2022 Name: [name] MRN: [id number] DOB: [birth year]   Care Coordination Outreach Attempts:  An unsuccessful telephone outreach was attempted today to offer the patient information about available care coordination services as a benefit of their health plan.   Follow Up Plan:  Additional outreach attempts will be made to offer the patient care coordination information and services.   Encounter Outcome:  No Answer  Care Coordination Interventions Activated:  No   Care Coordination Interventions:  No, not indicated    Jone Baseman, RN, MSN [facility] Care Management Care Management Coordinator Direct Line [phone]

## 2022-08-29 ENCOUNTER — Other Ambulatory Visit: Payer: Self-pay

## 2022-08-29 DIAGNOSIS — E1142 Type 2 diabetes mellitus with diabetic polyneuropathy: Secondary | ICD-10-CM

## 2022-08-30 MED ORDER — TRULICITY 0.75 MG/0.5ML ~~LOC~~ SOAJ: 0.7500 mg | SUBCUTANEOUS | 1 refills | Status: DC

## 2022-09-03 DIAGNOSIS — I252 Old myocardial infarction: Secondary | ICD-10-CM | POA: Diagnosis not present

## 2022-09-03 DIAGNOSIS — M19011 Primary osteoarthritis, right shoulder: Secondary | ICD-10-CM | POA: Diagnosis not present

## 2022-09-03 DIAGNOSIS — Z96652 Presence of left artificial knee joint: Secondary | ICD-10-CM | POA: Diagnosis not present

## 2022-09-03 DIAGNOSIS — I5022 Chronic systolic (congestive) heart failure: Secondary | ICD-10-CM | POA: Diagnosis not present

## 2022-09-03 DIAGNOSIS — I2510 Atherosclerotic heart disease of native coronary artery without angina pectoris: Secondary | ICD-10-CM | POA: Diagnosis not present

## 2022-09-03 DIAGNOSIS — Z8673 Personal history of transient ischemic attack (TIA), and cerebral infarction without residual deficits: Secondary | ICD-10-CM | POA: Diagnosis not present

## 2022-09-03 DIAGNOSIS — Z9181 History of falling: Secondary | ICD-10-CM | POA: Diagnosis not present

## 2022-09-03 DIAGNOSIS — E1142 Type 2 diabetes mellitus with diabetic polyneuropathy: Secondary | ICD-10-CM | POA: Diagnosis not present

## 2022-09-03 DIAGNOSIS — Z7984 Long term (current) use of oral hypoglycemic drugs: Secondary | ICD-10-CM | POA: Diagnosis not present

## 2022-09-03 DIAGNOSIS — Z951 Presence of aortocoronary bypass graft: Secondary | ICD-10-CM | POA: Diagnosis not present

## 2022-09-03 DIAGNOSIS — I130 Hypertensive heart and chronic kidney disease with heart failure and stage 1 through stage 4 chronic kidney disease, or unspecified chronic kidney disease: Secondary | ICD-10-CM | POA: Diagnosis not present

## 2022-09-03 DIAGNOSIS — E1122 Type 2 diabetes mellitus with diabetic chronic kidney disease: Secondary | ICD-10-CM | POA: Diagnosis not present

## 2022-09-03 DIAGNOSIS — G4733 Obstructive sleep apnea (adult) (pediatric): Secondary | ICD-10-CM | POA: Diagnosis not present

## 2022-09-03 DIAGNOSIS — L89151 Pressure ulcer of sacral region, stage 1: Secondary | ICD-10-CM | POA: Diagnosis not present

## 2022-09-03 DIAGNOSIS — I272 Pulmonary hypertension, unspecified: Secondary | ICD-10-CM | POA: Diagnosis not present

## 2022-09-03 DIAGNOSIS — I480 Paroxysmal atrial fibrillation: Secondary | ICD-10-CM | POA: Diagnosis not present

## 2022-09-03 DIAGNOSIS — N189 Chronic kidney disease, unspecified: Secondary | ICD-10-CM | POA: Diagnosis not present

## 2022-09-03 DIAGNOSIS — Z9049 Acquired absence of other specified parts of digestive tract: Secondary | ICD-10-CM | POA: Diagnosis not present

## 2022-09-12 ENCOUNTER — Telehealth: Payer: Self-pay

## 2022-09-12 NOTE — Patient Instructions (Signed)
Visit Information  Thank you for taking time to visit with me today. Please don't hesitate to contact me if I can be of assistance to you.   Following are the goals we discussed today:   Goals Addressed             This Visit's Progress    COMPLETED: Care Coordination Activities-No follow up required       Care Coordination Interventions: Advised patient to schedule annual wellness visit. Patient declined [facility] services and support.            If you are experiencing a Mental Health or [place] or need someone to talk to, please call the Suicide and Crisis Lifeline: 988   The patient verbalized understanding of instructions, educational materials, and care plan provided today and DECLINED offer to receive copy of patient instructions, educational materials, and care plan.   No further follow up required: patient decline   Jone Baseman, RN, MSN [place] Management Care Management Coordinator Direct Line [phone]

## 2022-09-12 NOTE — Patient Outreach (Signed)
  Care Coordination   Initial Visit Note   09/12/2022 Name: NIKIA LEVELS MRN: [id number] DOB: [birth year]  DELLAMAE ROSAMILIA is a 81 y.o. year old female who sees Alcus Dad, MD for primary care. I spoke with  [name] by phone today.  What matters to the patients health and wellness today?  none    Goals Addressed             This Visit's Progress    COMPLETED: Care Coordination Activities-No follow up required       Care Coordination Interventions: Advised patient to schedule annual wellness visit. Patient declined [facility] services and support.            SDOH assessments and interventions completed:  Yes  SDOH Interventions Today    Flowsheet Row Most Recent Value  SDOH Interventions   Housing Interventions Intervention Not Indicated  Transportation Interventions Intervention Not Indicated        Care Coordination Interventions Activated:  Yes  Care Coordination Interventions:  Yes, provided   Follow up plan: No further intervention required.   Encounter Outcome:  Pt. Visit Completed   Jone Baseman, RN, MSN [place] [facility] Care Management Coordinator Direct Line [phone]

## 2022-09-24 ENCOUNTER — Telehealth: Payer: Self-pay

## 2022-09-24 DIAGNOSIS — E1142 Type 2 diabetes mellitus with diabetic polyneuropathy: Secondary | ICD-10-CM

## 2022-09-24 MED ORDER — TRULICITY 0.75 MG/0.5ML ~~LOC~~ SOAJ: 0.7500 mg | SUBCUTANEOUS | 0 refills | Status: DC

## 2022-09-24 NOTE — Telephone Encounter (Signed)
New Rx sent for Trulicity (90 day supply) as requested.  I'm glad her shoulder is improving with physical therapy, that is great news!   Alcus Dad, MD PGY-3, [place]

## 2022-09-24 NOTE — Telephone Encounter (Signed)
Patient calls nurse line regarding letter she received from insurance. Letter states that Trulicity is requiring PA. Insurance is also requesting that prescription be updated to 90 day supply.   Please send new rx, then PA can be initiated.    *Patient also reports that she is no longer having surgery for her shoulder. She states that surgery was canceled due to A1c level. She states that she has called orthopedic x 4 in order to reschedule, however, has not heard back from them. She states that therapy is helping shoulder and that she no longer wants to pursue surgical intervention.   Forwarding to PCP.   Talbot Grumbling, RN

## 2022-09-26 ENCOUNTER — Other Ambulatory Visit (HOSPITAL_COMMUNITY): Payer: Self-pay

## 2022-09-26 NOTE — Telephone Encounter (Signed)
A Prior Authorization was initiated for this patients TRULICITY through CoverMyMeds.   Key: [id number]

## 2022-09-27 ENCOUNTER — Other Ambulatory Visit (HOSPITAL_COMMUNITY): Payer: Self-pay

## 2022-09-27 ENCOUNTER — Encounter: Payer: Self-pay | Admitting: Podiatry

## 2022-09-27 ENCOUNTER — Ambulatory Visit (INDEPENDENT_AMBULATORY_CARE_PROVIDER_SITE_OTHER): Payer: Medicare HMO | Admitting: Podiatry

## 2022-09-27 DIAGNOSIS — E1151 Type 2 diabetes mellitus with diabetic peripheral angiopathy without gangrene: Secondary | ICD-10-CM | POA: Diagnosis not present

## 2022-09-27 DIAGNOSIS — M79674 Pain in right toe(s): Secondary | ICD-10-CM | POA: Diagnosis not present

## 2022-09-27 DIAGNOSIS — B351 Tinea unguium: Secondary | ICD-10-CM | POA: Diagnosis not present

## 2022-09-27 DIAGNOSIS — M79675 Pain in left toe(s): Secondary | ICD-10-CM | POA: Diagnosis not present

## 2022-09-27 NOTE — Telephone Encounter (Signed)
PA not needed for medication trulicity.  84 day supply is covered at no charge.

## 2022-09-27 NOTE — Progress Notes (Signed)
  Subjective:  Patient ID: [name], female    DOB: 08-24-1941,  MRN: [id number]  [name] presents to clinic today for at risk foot care. Pt has h/o NIDDM with PAD and painful elongated mycotic toenails 1-5 bilaterally which are tender when wearing enclosed shoe gear. Pain is relieved with periodic professional debridement.  Chief Complaint  Patient presents with   Nail Problem    Diabetic foot care BS-127 A1C-7.3 PCP-Ashleigh Wells PCP VST-08/13/2022   New problem(s): None.   PCP is Alcus Dad, MD.  Allergies  Allergen Reactions   Benazepril Other (See Comments)    Unknown reaction at age 52-65 - possibly dizziness   Cymbalta [Duloxetine Hcl]     Itch, Dry mouth    Ozempic (0.25 Or 0.5 Mg-Dose) [Semaglutide(0.25 Or 0.'5mg'$ -Dos)] Diarrhea    Gi intollerance   Tape     TOLERATES PAPER TAPE ONLY- due to thin skin. NOT ALLERGY PER PT   Angiotensin Receptor Blockers Other (See Comments)    Hypotension reaction   Bactrim [Sulfamethoxazole-Trimethoprim] Other (See Comments)    Unknown reaction   Fe-Succ-C-Thre-B12-Des Stomach Other (See Comments)    Unknown reaction  Ferocon?    Review of Systems: Negative except as noted in the HPI.  Objective: No changes noted in today's physical examination.  [name] is a pleasant 81 y.o. female WD, WN in NAD. AAO x 3.  Vascular Examination: CFT <3 seconds b/l. DP pulses faintly palpable b/l. PT pulses diminished b/l. Digital hair absent. Skin temperature gradient warm to warm b/l. No ischemia or gangrene. No cyanosis or clubbing noted b/l.    Neurological Examination: Sensation grossly intact b/l with 10 gram monofilament. Vibratory sensation intact b/l.   Dermatological Examination: Pedal skin warm and supple b/l. Toenails 1-5 b/l thick, discolored, elongated with subungual debris and pain on dorsal palpation.    Musculoskeletal Examination: Muscle strength 5/5 to b/l LE. HAV with bunion bilaterally and  hammertoes 2-5 b/l. Utilizes walker for ambulation assistance.  Radiographs: None  Assessment/Plan: 1. Pain due to onychomycosis of toenails of both feet   2. Diabetes mellitus type 2 with peripheral artery disease (HCC)     No orders of the defined types were placed in this encounter.   -Patient was evaluated and treated. All patient's and/or POA's questions/concerns answered on today's visit. -Consent given for treatment as described below: -Toenails 1-5 bilaterally were debrided in length and girth with sterile nail nippers and dremel. Pinpoint bleeding of L 4th toe addressed with Lumicain Hemostatic Solution, cleansed with alcohol. triple antibiotic ointment applied. No further treatment required by patient/caregiver. -Patient/POA to call should there be question/concern in the interim.   Return in about 3 months (around 12/28/2022).  Marzetta Board, DPM

## 2022-10-03 ENCOUNTER — Other Ambulatory Visit (HOSPITAL_COMMUNITY): Payer: Self-pay | Admitting: Cardiology

## 2022-10-03 DIAGNOSIS — H40023 Open angle with borderline findings, high risk, bilateral: Secondary | ICD-10-CM | POA: Diagnosis not present

## 2022-10-03 DIAGNOSIS — H43813 Vitreous degeneration, bilateral: Secondary | ICD-10-CM | POA: Diagnosis not present

## 2022-10-03 DIAGNOSIS — Z961 Presence of intraocular lens: Secondary | ICD-10-CM | POA: Diagnosis not present

## 2022-10-03 DIAGNOSIS — H26492 Other secondary cataract, left eye: Secondary | ICD-10-CM | POA: Diagnosis not present

## 2022-10-03 DIAGNOSIS — H353131 Nonexudative age-related macular degeneration, bilateral, early dry stage: Secondary | ICD-10-CM | POA: Diagnosis not present

## 2022-10-03 DIAGNOSIS — E113493 Type 2 diabetes mellitus with severe nonproliferative diabetic retinopathy without macular edema, bilateral: Secondary | ICD-10-CM | POA: Diagnosis not present

## 2022-10-03 DIAGNOSIS — H17821 Peripheral opacity of cornea, right eye: Secondary | ICD-10-CM | POA: Diagnosis not present

## 2022-10-03 DIAGNOSIS — Z0100 Encounter for examination of eyes and vision without abnormal findings: Secondary | ICD-10-CM | POA: Diagnosis not present

## 2022-10-08 ENCOUNTER — Other Ambulatory Visit: Payer: Self-pay

## 2022-10-08 NOTE — Telephone Encounter (Signed)
[facility] is requesting new Rx for the following:  One Touch Lancets  One Touch test strips  Please send Rx if appropriate  Ottis Stain, CMA

## 2022-10-10 MED ORDER — GLUCOSE BLOOD VI STRP: ORAL_STRIP | 12 refills | Status: DC

## 2022-10-10 MED ORDER — ONETOUCH DELICA PLUS LANCET33G MISC: 6 refills | Status: DC

## 2022-10-12 ENCOUNTER — Ambulatory Visit (HOSPITAL_COMMUNITY)
Admission: RE | Admit: 2022-10-12 | Discharge: 2022-10-12 | Disposition: A | Discharge status: Home / Self Care | Payer: Medicare HMO | Source: Ambulatory Visit | Attending: Cardiology | Admitting: Cardiology

## 2022-10-12 ENCOUNTER — Encounter (HOSPITAL_COMMUNITY): Payer: Self-pay | Admitting: Cardiology

## 2022-10-12 VITALS — BP 110/70 | HR 64 | Wt 201.2 lb

## 2022-10-12 DIAGNOSIS — I255 Ischemic cardiomyopathy: Secondary | ICD-10-CM | POA: Diagnosis not present

## 2022-10-12 DIAGNOSIS — Z79899 Other long term (current) drug therapy: Secondary | ICD-10-CM | POA: Insufficient documentation

## 2022-10-12 DIAGNOSIS — I130 Hypertensive heart and chronic kidney disease with heart failure and stage 1 through stage 4 chronic kidney disease, or unspecified chronic kidney disease: Secondary | ICD-10-CM | POA: Diagnosis not present

## 2022-10-12 DIAGNOSIS — G4733 Obstructive sleep apnea (adult) (pediatric): Secondary | ICD-10-CM | POA: Diagnosis not present

## 2022-10-12 DIAGNOSIS — I2510 Atherosclerotic heart disease of native coronary artery without angina pectoris: Secondary | ICD-10-CM | POA: Diagnosis not present

## 2022-10-12 DIAGNOSIS — E1122 Type 2 diabetes mellitus with diabetic chronic kidney disease: Secondary | ICD-10-CM | POA: Insufficient documentation

## 2022-10-12 DIAGNOSIS — I272 Pulmonary hypertension, unspecified: Secondary | ICD-10-CM | POA: Insufficient documentation

## 2022-10-12 DIAGNOSIS — Z7984 Long term (current) use of oral hypoglycemic drugs: Secondary | ICD-10-CM | POA: Diagnosis not present

## 2022-10-12 DIAGNOSIS — Z7901 Long term (current) use of anticoagulants: Secondary | ICD-10-CM | POA: Diagnosis not present

## 2022-10-12 DIAGNOSIS — N183 Chronic kidney disease, stage 3 unspecified: Secondary | ICD-10-CM | POA: Diagnosis not present

## 2022-10-12 DIAGNOSIS — I4819 Other persistent atrial fibrillation: Secondary | ICD-10-CM | POA: Insufficient documentation

## 2022-10-12 DIAGNOSIS — Z8673 Personal history of transient ischemic attack (TIA), and cerebral infarction without residual deficits: Secondary | ICD-10-CM | POA: Insufficient documentation

## 2022-10-12 DIAGNOSIS — Z6829 Body mass index (BMI) 29.0-29.9, adult: Secondary | ICD-10-CM | POA: Diagnosis not present

## 2022-10-12 DIAGNOSIS — I5022 Chronic systolic (congestive) heart failure: Secondary | ICD-10-CM | POA: Diagnosis not present

## 2022-10-12 DIAGNOSIS — Z951 Presence of aortocoronary bypass graft: Secondary | ICD-10-CM | POA: Diagnosis not present

## 2022-10-12 LAB — BASIC METABOLIC PANEL
Anion gap: 13 (ref 5–15)
BUN: 95 mg/dL — ABNORMAL HIGH (ref 8–23)
CO2: 27 mmol/L (ref 22–32)
Calcium: 10.4 mg/dL — ABNORMAL HIGH (ref 8.9–10.3)
Chloride: 95 mmol/L — ABNORMAL LOW (ref 98–111)
Creatinine, Ser: 1.64 mg/dL — ABNORMAL HIGH (ref 0.44–1.00)
GFR, Estimated: 31 mL/min — ABNORMAL LOW (ref >60)
Glucose, Bld: 111 mg/dL — ABNORMAL HIGH (ref 70–99)
Potassium: 4.5 mmol/L (ref 3.5–5.1)
Sodium: 135 mmol/L (ref 135–145)

## 2022-10-12 LAB — CBC
HCT: 40.7 % (ref 36.0–46.0)
Hemoglobin: 13.3 g/dL (ref 12.0–15.0)
MCH: 31.3 pg (ref 26.0–34.0)
MCHC: 32.7 g/dL (ref 30.0–36.0)
MCV: 95.8 fL (ref 80.0–100.0)
Platelets: 130 10*3/uL — ABNORMAL LOW (ref 150–400)
RBC: 4.25 MIL/uL (ref 3.87–5.11)
RDW: 13.9 % (ref 11.5–15.5)
WBC: 5.7 10*3/uL (ref 4.0–10.5)
nRBC: 0 % (ref 0.0–0.2)

## 2022-10-12 LAB — BRAIN NATRIURETIC PEPTIDE: B Natriuretic Peptide: 50.9 pg/mL (ref 0.0–100.0)

## 2022-10-12 NOTE — Patient Instructions (Signed)
There has been no changes to your medications.  Labs done today, your results will be available in MyChart, we will contact you for abnormal readings.  Please call the office if you are interested in the [place].  Your physician has requested that you have an echocardiogram. Echocardiography is a painless test that uses sound waves to create images of your heart. It provides your doctor with information about the size and shape of your heart and how well your heart's chambers and valves are working. This procedure takes approximately one hour. There are no restrictions for this procedure. Please do NOT wear cologne, perfume, aftershave, or lotions (deodorant is allowed). Please arrive 15 minutes prior to your appointment time.  Your physician recommends that you schedule a follow-up appointment in: 3 months with an echocardiogram   If you have any questions or concerns before your next appointment please send [place] a message through [place] or call our office at [phone].    TO LEAVE A MESSAGE FOR THE NURSE SELECT OPTION 2, PLEASE LEAVE A MESSAGE INCLUDING: YOUR NAME DATE OF BIRTH CALL BACK NUMBER REASON FOR CALL**this is important as we prioritize the call backs  YOU WILL RECEIVE A CALL BACK THE SAME DAY AS LONG AS YOU CALL BEFORE 4:00 PM  At the [facility], you and your health needs are our priority. As part of our continuing mission to provide you with exceptional heart care, we have created designated Provider Care Teams. These Care Teams include your primary Cardiologist (physician) and Advanced Practice Providers (APPs- Physician Assistants and Nurse Practitioners) who all work together to provide you with the care you need, when you need it.   You may see any of the following providers on your designated Care Team at your next follow up: Dr Glori Bickers Dr Loralie Champagne Dr. Roxana Hires, NP Lyda Jester, [place] [facility] [place], [place] Forestine Na, NP Audry Riles, PharmD   Please be sure to bring in all your medications bottles to every appointment.

## 2022-10-14 NOTE — Progress Notes (Signed)
ID:  [name], DOB 06/27/1941, MRN [id number]   Provider location: [place] [facility] Type of Visit: Established patient   PCP:  Alcus Dad, MD  Primary Cardiologist:  Fransico Him, MD HF Cardiologist: Dr. Aundra Dubin   History of Present Illness: [name] is a 81 y.o. female who has a history of CAD status post CABG 2009, ischemic cardiomyopathy with chronic systolic CHF, persistent atrial fibrillation with stroke in 03/2016 when INR was subtherapeutic, stage III CKD, morbid obesity, pulmonary hypertension, diabetes, GI bleed 03/2017, and severe sleep apnea.    Seen in [facility] clinics 09/10/18 and 09/24/18, both times with volume overload and marked peripheral edema with skin breakdown. Torsemide increased at each visit. Echo in 7/19 showed EF 45-50% with mild RV dilation/mild RV systolic dysfunction and D-shaped interventricular septum.    AKI earlier in 7/20 in setting of excessive torsemide, she had increased dose to 60 qam/40 qpm on her own.  This was cut back to 40 mg daily and losartan and spironolactone were stopped.   Echo in 9/20 showed EF 45% with mildly decreased RV systolic function, PASP 46 mmHg with dilated IVC.  PYP scan in 10/21 was negative.   Echo in 4/22 showed EF 45% with diffuse hypokinesis, mildly decreased RV systolic function, PASP 47, IVC dilated, moderate TR, mild MR.   Follow up 5/23, NYHA III and volume up. Lasix increased to 60 mg bid.  Today she returns for HF follow up with her son. Weight is trending down. Breathing is ok, she continues to use a walker for balance.  No dyspnea walking on flat ground generally.  No chest pain.  She sleeps in a hospital bed and chronically raises the head of the bed. No lightheadedness.   ECG (personally reviewed): Atrial fibrillation, nonspecific T wave flattening  Labs (12/18): K 3.9, Creatinine 1.65 Labs (5/19): K 4.1, creatinine 1.7 Labs (6/19): hgb 8.2 Labs (8/19): K 4.3, creatinine  1.73 Labs (9/19): K 4, creatinine 1.56 Labs (11/19): K 4.7, creatinine 2.04 Labs (12/19): creatinine 1.44 Labs (7/20): K 5, creatinine 2.49 => 1.86, Hgb 11.1 Labs (8/20): K 3.9, creatinine 1.79 Labs (9/20): LDL 50 Labs (11/20): K 3.8, creatinine 2.01 Labs (2/21): K 4, creatinine 1.9 Labs (7/21): K 4.2, creatinine 1.84, hgb 11.2 Labs (9/21): myeloma panel negative, urine immunofixation negative, LDL 50, K 4.6, creatinine 1.76 Labs (3/22): hgb 13.4, K 4.3, creatinine 1.86 Labs (5/22): K 4.5, creatinine 1.98 Labs (12/22): K 4.6, creatinine 1.71, LDL 47 Labs (8/23): K 4.5, creatinine 1.92, hgb 12.1  Review of systems complete and found to be negative unless listed in HPI.    Past Medical History 1. Chronic systolic CHF: Ischemic cardiomyopathy with prominent RV failure.   - Echo (11/18): LVEF 35-40%, severe RV dilation, severe RAE, severe TR.  - Echo (7/19): EF 45-50%, diffuse hypokinesis, mild RV dilation with mildly decreased RV systolic function, D-shaped interventricular septum suggestive of RV pressure/volume overload, mild MR, moderate TR, PASP 49 mmhg.  - Echo (9/20): EF 45%, mild LV dilation, mild RV dilation with mildly decreased systolic function, PASP 46 mmHg, IVC dilated.  - PYP scan (10/21): Negative.  - Echo (4/22): EF 45% with diffuse hypokinesis, mildly decreased RV systolic function, PASP 47, IVC dilated, moderate TR, mild MR. 2. CAD: CABG 2009. No angiography since that time.   3. HTN 4. Atrial fibrillation: Chronic since 2013.  5. CKD stage 3 6. Morbid obesity 7. OSA 8. Chronic venous stasis  with RLE wound/Bullae 9. CVA 5/18.   Current Outpatient Medications  Medication Sig Dispense Refill   acetaminophen (TYLENOL 8 HOUR) 650 MG CR tablet Take 1 tablet (650 mg total) by mouth every 8 (eight) hours as needed for pain. 30 tablet 0   apixaban (ELIQUIS) 2.5 MG TABS tablet Take 1 tablet (2.5 mg total) by mouth 2 (two) times daily. 180 tablet 3   atorvastatin (LIPITOR)  40 MG tablet TAKE 1 TABLET DAILY 100 tablet 2   Blood Glucose Monitoring Suppl (ONE TOUCH ULTRA 2) w/Device KIT USE TO CHECK BLOOD SUGAR UPTO 3 TIMES A DAY 1 kit 0   carvedilol (COREG) 6.25 MG tablet TAKE 1 TABLET TWICE A DAY  WITH MEALS 180 tablet 3   Cholecalciferol 25 MCG (1000 UT) tablet Take 1,000 Units by mouth daily.     docusate sodium (COLACE) 100 MG capsule Take 100 mg by mouth at bedtime.     Dulaglutide (TRULICITY) 9.38 HW/2.9HB SOPN Inject 0.75 mg into the skin once a week. 6 mL 0   DULoxetine (CYMBALTA) 20 MG capsule TAKE 1 CAPSULE DAILY. 90 capsule 3   ferrous sulfate 325 (65 FE) MG tablet TAKE 1 TABLET BY MOUTH EVERY DAY WITH BREAKFAST 90 tablet 1   glucose blood (ONE TOUCH ULTRA TEST) test strip Use three times a day 100 each 3   glucose blood test strip Use as instructed to check blood glucose up to 4x daily 100 each 12   JARDIANCE 10 MG TABS tablet TAKE 1 TABLET DAILY BEFORE BREAKFAST 100 tablet 2   Lancets (ONETOUCH DELICA PLUS [id number]) MISC Please use to check blood sugar up to four times daily. 100 each 6   metolazone (ZAROXOLYN) 2.5 MG tablet Take only as directed by CHF clinic 10 tablet 0   Multiple Vitamins-Minerals (PRESERVISION AREDS 2+MULTI VIT PO) Take 1 capsule by mouth in the morning and at bedtime.     nitroGLYCERIN (NITROSTAT) 0.4 MG SL tablet PLACE 1 TABLET (0.4 MG TOTAL) UNDER THE TONGUE EVERY 5 (FIVE) MINUTES AS NEEDED FOR CHEST PAIN (UP TO 3 DOSES). 15 tablet 5   potassium chloride SA (KLOR-CON M20) 20 MEQ tablet Take 3 tablets (60 mEq total) by mouth 2 (two) times daily. 540 tablet 3   rOPINIRole (REQUIP) 0.5 MG tablet TAKE 2 TABLETS AT BEDTIME 180 tablet 3   spironolactone (ALDACTONE) 25 MG tablet Take 0.5 tablets (12.5 mg total) by mouth daily. 50 tablet 1   torsemide (DEMADEX) 20 MG tablet Take 3 tablets (60 mg total) by mouth 2 (two) times daily. 450 tablet 3   vitamin B-12 (CYANOCOBALAMIN) 1000 MCG tablet Take 1 tablet (1,000 mcg total) by mouth daily.  90 tablet 1   zinc oxide 20 % ointment Apply 1 application topically as needed (buttocks irritation). 425 g 1   No current facility-administered medications for this encounter.   Allergies  Allergen Reactions   Benazepril Other (See Comments)    Unknown reaction at age 82-65 - possibly dizziness   Cymbalta [Duloxetine Hcl]     Itch, Dry mouth    Ozempic (0.25 Or 0.5 Mg-Dose) [Semaglutide(0.25 Or 0.57m-Dos)] Diarrhea    Gi intollerance   Tape     TOLERATES PAPER TAPE ONLY- due to thin skin. NOT ALLERGY PER PT   Angiotensin Receptor Blockers Other (See Comments)    Hypotension reaction   Bactrim [Sulfamethoxazole-Trimethoprim] Other (See Comments)    Unknown reaction   Fe-Succ-C-Thre-B12-Des Stomach Other (See Comments)    Unknown reaction  Ferocon?   Social History   Socioeconomic History   Marital status: Widowed    Spouse name: Not on file   Number of children: 2   Years of education: 20   Highest education level: Not on file  Occupational History    Comment: retired  Tobacco Use   Smoking status: Never   Smokeless tobacco: Never  Vaping Use   Vaping Use: Never used  Substance and Sexual Activity   Alcohol use: No   Drug use: Never   Sexual activity: Not Currently  Other Topics Concern   Not on file  Social History Narrative   Widowed   01/27/21 Lives with son,  in [place]   2 children   Not routinely exercising   Social Determinants of Health   Financial Resource Strain: Not on file  Food Insecurity: Not on file  Transportation Needs: No Transportation Needs (09/12/2022)   PRAPARE - Hydrologist (Medical): No    Lack of Transportation (Non-Medical): No  Physical Activity: Not on file  Stress: Not on file  Social Connections: Not on file  Intimate Partner Violence: Not on file   Family History  Problem Relation Age of Onset   Clotting disorder Mother        Cerebral hemorrhage   Other Mother        cerebral hemorrhage    Emphysema Father        COD   Lung cancer Brother    Heart disease Neg Hx    BP 110/70   Pulse 64   Wt 91.3 kg (201 lb 3.2 oz)   LMP  (LMP Unknown)   SpO2 99%   BMI 29.71 kg/m   Wt Readings from Last 3 Encounters:  10/12/22 91.3 kg (201 lb 3.2 oz)  08/13/22 92 kg (202 lb 12.8 oz)  07/19/22 93.2 kg (205 lb 6.4 oz)    PHYSICAL EXAM:  General: NAD Neck: No JVD, no thyromegaly or thyroid nodule.  Lungs: Clear to auscultation bilaterally with normal respiratory effort. CV: Nondisplaced PMI.  Heart irregular S1/S2, no S3/S4, no murmur.  No peripheral edema.  No carotid bruit.  Normal pedal pulses.  Abdomen: Soft, nontender, no hepatosplenomegaly, no distention.  Skin: Intact without lesions or rashes.  Neurologic: Alert and oriented x 3.  Psych: Normal affect. Extremities: No clubbing or cyanosis.  HEENT: Normal.   ASSESSMENT & PLAN: 1. Chronic systolic CHF with prominent RV failure: Ischemic cardiomyopathy.  Echo in 7/19 showed LV EF 45-50% (improved) with mildly dilated/mildly dysfunction RV.  D-shaped septum suggested RV pressure/volume overload.  Echo in 9/20 showed EF 45%, mildly dysfunctional RV, PASP 46 mmHg with dilated IVC.  PYP scan negative and myeloma workup negative, no evidence for cardiac amyloidosis.  Echo in 4/22 showed EF 45% with diffuse hypokinesis, mildly decreased RV systolic function, PASP 47, IVC dilated, moderate TR, mild MR.  She is not volume overloaded by exam or REDS clip. NYHA class II symptoms, limited by ortho issues.  - Continue Jardiance 10 mg daily.  - Continue torsemide 60 mg bid. BMET/BNP today. - Continue Coreg 6.25 mg bid.   - Continue spironolactone 12.5 mg daily.   - I recommended Cardiomems placement.  She will think about it, I gave her information.  - Repeat echo at followup in 3 months.  2. CAD s/p CABG: No chest pain.  - Continue atorvastatin 40 mg daily.  - No ASA with stable CAD on Eliquis.  3. Atrial fibrillation:  Chronic. Given  long-term atrial fibrillation, she is unlikely to successfully cardiovert.  - Continue Eliquis 2.5 mg bid.   4. CKD stage 3: BMET today.  5. H/o CVA: Continue Eliquis.   6. OSA: She cannot tolerate CPAP.  7. HTN: BP controlled.    Follow up 3 months with echo  Signed, Loralie Champagne, MD  10/14/2022  [facility] [address] [facility] and [place] [address] [phone] (office) [phone] (fax)

## 2022-10-16 ENCOUNTER — Telehealth (HOSPITAL_COMMUNITY): Payer: Self-pay | Admitting: *Deleted

## 2022-10-16 NOTE — Telephone Encounter (Signed)
Ok let's get her set up.

## 2022-10-16 NOTE — Telephone Encounter (Signed)
Pt left vm stating she wants to proceed with cardio mems procedure.   Routed to [place] and [place] to work on [employer]

## 2022-10-24 DIAGNOSIS — H26492 Other secondary cataract, left eye: Secondary | ICD-10-CM | POA: Diagnosis not present

## 2022-11-06 NOTE — Telephone Encounter (Signed)
Application process started

## 2022-11-22 ENCOUNTER — Other Ambulatory Visit (HOSPITAL_COMMUNITY): Payer: Self-pay

## 2022-11-22 MED ORDER — POTASSIUM CHLORIDE CRYS ER 20 MEQ PO TBCR: 60.0000 meq | EXTENDED_RELEASE_TABLET | Freq: Two times a day (BID) | ORAL | 3 refills | Status: DC

## 2022-11-29 NOTE — Telephone Encounter (Signed)
UNABLE TO PROCEED WITH APPLICATION CRITERIA NOT MET NOT RECENT HOSPITALIZATIONS AND BNP TOO LOW

## 2022-12-03 DIAGNOSIS — K08409 Partial loss of teeth, unspecified cause, unspecified class: Secondary | ICD-10-CM | POA: Diagnosis not present

## 2022-12-03 DIAGNOSIS — G250 Essential tremor: Secondary | ICD-10-CM | POA: Diagnosis not present

## 2022-12-03 DIAGNOSIS — R269 Unspecified abnormalities of gait and mobility: Secondary | ICD-10-CM | POA: Diagnosis not present

## 2022-12-03 DIAGNOSIS — E785 Hyperlipidemia, unspecified: Secondary | ICD-10-CM | POA: Diagnosis not present

## 2022-12-03 DIAGNOSIS — E669 Obesity, unspecified: Secondary | ICD-10-CM | POA: Diagnosis not present

## 2022-12-03 DIAGNOSIS — D6869 Other thrombophilia: Secondary | ICD-10-CM | POA: Diagnosis not present

## 2022-12-03 DIAGNOSIS — E1140 Type 2 diabetes mellitus with diabetic neuropathy, unspecified: Secondary | ICD-10-CM | POA: Diagnosis not present

## 2022-12-03 DIAGNOSIS — E876 Hypokalemia: Secondary | ICD-10-CM | POA: Diagnosis not present

## 2022-12-03 DIAGNOSIS — I2510 Atherosclerotic heart disease of native coronary artery without angina pectoris: Secondary | ICD-10-CM | POA: Diagnosis not present

## 2022-12-03 DIAGNOSIS — M1990 Unspecified osteoarthritis, unspecified site: Secondary | ICD-10-CM | POA: Diagnosis not present

## 2022-12-03 DIAGNOSIS — I509 Heart failure, unspecified: Secondary | ICD-10-CM | POA: Diagnosis not present

## 2022-12-03 DIAGNOSIS — I110 Hypertensive heart disease with heart failure: Secondary | ICD-10-CM | POA: Diagnosis not present

## 2023-01-14 ENCOUNTER — Telehealth: Payer: Self-pay | Admitting: *Deleted

## 2023-01-14 ENCOUNTER — Ambulatory Visit (HOSPITAL_COMMUNITY)
Admission: RE | Admit: 2023-01-14 | Discharge: 2023-01-14 | Disposition: A | Discharge status: Home / Self Care | Payer: Medicare HMO | Source: Ambulatory Visit | Attending: Cardiology | Admitting: Cardiology

## 2023-01-14 ENCOUNTER — Ambulatory Visit (HOSPITAL_BASED_OUTPATIENT_CLINIC_OR_DEPARTMENT_OTHER)
Admission: RE | Admit: 2023-01-14 | Discharge: 2023-01-14 | Disposition: A | Discharge status: Home / Self Care | Payer: Medicare HMO | Source: Ambulatory Visit | Attending: Cardiology | Admitting: Cardiology

## 2023-01-14 VITALS — BP 126/74 | HR 63 | Ht 69.0 in | Wt 216.0 lb

## 2023-01-14 DIAGNOSIS — I482 Chronic atrial fibrillation, unspecified: Secondary | ICD-10-CM | POA: Diagnosis not present

## 2023-01-14 DIAGNOSIS — I5022 Chronic systolic (congestive) heart failure: Secondary | ICD-10-CM | POA: Diagnosis not present

## 2023-01-14 DIAGNOSIS — E1122 Type 2 diabetes mellitus with diabetic chronic kidney disease: Secondary | ICD-10-CM | POA: Insufficient documentation

## 2023-01-14 DIAGNOSIS — N183 Chronic kidney disease, stage 3 unspecified: Secondary | ICD-10-CM | POA: Diagnosis not present

## 2023-01-14 DIAGNOSIS — I2510 Atherosclerotic heart disease of native coronary artery without angina pectoris: Secondary | ICD-10-CM | POA: Insufficient documentation

## 2023-01-14 DIAGNOSIS — Z8673 Personal history of transient ischemic attack (TIA), and cerebral infarction without residual deficits: Secondary | ICD-10-CM | POA: Insufficient documentation

## 2023-01-14 DIAGNOSIS — I130 Hypertensive heart and chronic kidney disease with heart failure and stage 1 through stage 4 chronic kidney disease, or unspecified chronic kidney disease: Secondary | ICD-10-CM | POA: Diagnosis not present

## 2023-01-14 DIAGNOSIS — Z7984 Long term (current) use of oral hypoglycemic drugs: Secondary | ICD-10-CM | POA: Insufficient documentation

## 2023-01-14 DIAGNOSIS — G4733 Obstructive sleep apnea (adult) (pediatric): Secondary | ICD-10-CM | POA: Diagnosis not present

## 2023-01-14 DIAGNOSIS — Z79899 Other long term (current) drug therapy: Secondary | ICD-10-CM | POA: Insufficient documentation

## 2023-01-14 DIAGNOSIS — I255 Ischemic cardiomyopathy: Secondary | ICD-10-CM | POA: Diagnosis not present

## 2023-01-14 DIAGNOSIS — I272 Pulmonary hypertension, unspecified: Secondary | ICD-10-CM | POA: Insufficient documentation

## 2023-01-14 DIAGNOSIS — R0602 Shortness of breath: Secondary | ICD-10-CM | POA: Diagnosis not present

## 2023-01-14 DIAGNOSIS — Z951 Presence of aortocoronary bypass graft: Secondary | ICD-10-CM | POA: Diagnosis not present

## 2023-01-14 DIAGNOSIS — Z7901 Long term (current) use of anticoagulants: Secondary | ICD-10-CM | POA: Insufficient documentation

## 2023-01-14 LAB — CBC
HCT: 38.1 % (ref 36.0–46.0)
Hemoglobin: 12.1 g/dL (ref 12.0–15.0)
MCH: 30.9 pg (ref 26.0–34.0)
MCHC: 31.8 g/dL (ref 30.0–36.0)
MCV: 97.2 fL (ref 80.0–100.0)
Platelets: 115 10*3/uL — ABNORMAL LOW (ref 150–400)
RBC: 3.92 MIL/uL (ref 3.87–5.11)
RDW: 13.3 % (ref 11.5–15.5)
WBC: 5.4 10*3/uL (ref 4.0–10.5)
nRBC: 0 % (ref 0.0–0.2)

## 2023-01-14 LAB — BASIC METABOLIC PANEL
Anion gap: 8 (ref 5–15)
BUN: 89 mg/dL — ABNORMAL HIGH (ref 8–23)
CO2: 29 mmol/L (ref 22–32)
Calcium: 9.9 mg/dL (ref 8.9–10.3)
Chloride: 101 mmol/L (ref 98–111)
Creatinine, Ser: 1.87 mg/dL — ABNORMAL HIGH (ref 0.44–1.00)
GFR, Estimated: 26 mL/min — ABNORMAL LOW (ref >60)
Glucose, Bld: 116 mg/dL — ABNORMAL HIGH (ref 70–99)
Potassium: 4.5 mmol/L (ref 3.5–5.1)
Sodium: 138 mmol/L (ref 135–145)

## 2023-01-14 LAB — LIPID PANEL
Cholesterol: 107 mg/dL (ref 0–200)
HDL: 51 mg/dL (ref >40)
LDL Cholesterol: 48 mg/dL (ref 0–99)
Total CHOL/HDL Ratio: 2.1 RATIO
Triglycerides: 40 mg/dL (ref <150)
VLDL: 8 mg/dL (ref 0–40)

## 2023-01-14 LAB — ECHOCARDIOGRAM COMPLETE
Calc EF: 44.5 %
Est EF: 50
MV M vel: 4.9 m/s
MV Peak grad: 96 mmHg
P 1/2 time: 521 msec
Radius: 0.4 cm
S' Lateral: 4.5 cm
Single Plane A2C EF: 46.9 %
Single Plane A4C EF: 46.4 %

## 2023-01-14 LAB — BRAIN NATRIURETIC PEPTIDE: B Natriuretic Peptide: 66.1 pg/mL (ref 0.0–100.0)

## 2023-01-14 MED ORDER — TORSEMIDE 20 MG PO TABS: 80.0000 mg | ORAL_TABLET | Freq: Two times a day (BID) | ORAL | 3 refills | Status: DC

## 2023-01-14 NOTE — Progress Notes (Signed)
ID:  [name], DOB Oct 09, 1941, MRN [id number]   Provider location: [place] [facility] Type of Visit: Established patient   PCP:  Alcus Dad, MD  Primary Cardiologist:  Fransico Him, MD HF Cardiologist: Dr. Aundra Dubin   History of Present Illness: [name] is a 82 y.o. female who has a history of CAD status post CABG 2009, ischemic cardiomyopathy with chronic systolic CHF, persistent atrial fibrillation with stroke in 03/2016 when INR was subtherapeutic, stage III CKD, morbid obesity, pulmonary hypertension, diabetes, GI bleed 03/2017, and severe sleep apnea.    Seen in [facility] clinics 09/10/18 and 09/24/18, both times with volume overload and marked peripheral edema with skin breakdown. Torsemide increased at each visit. Echo in 7/19 showed EF 45-50% with mild RV dilation/mild RV systolic dysfunction and D-shaped interventricular septum.    AKI earlier in 7/20 in setting of excessive torsemide, she had increased dose to 60 qam/40 qpm on her own.  This was cut back to 40 mg daily and losartan and spironolactone were stopped.   Echo in 9/20 showed EF 45% with mildly decreased RV systolic function, PASP 46 mmHg with dilated IVC.  PYP scan in 10/21 was negative.   Echo in 4/22 showed EF 45% with diffuse hypokinesis, mildly decreased RV systolic function, PASP 47, IVC dilated, moderate TR, mild MR.   Echo was done today and reviewed, EF 50%, mildly decreased RV systolic function, dilated IVC, PASP 47 mmHg.   Today she returns for HF follow up with her son. Weight is up 15 lbs.  She has been worried about her son's illnesses and has been eating more.  High sodium diet.  She uses a standing walker.  She was mildly short of breath walking into the office today.  She does ok in the house.  Chronic orthopnea, uses a hospital bed and raises HOB.    Labs (12/18): K 3.9, Creatinine 1.65 Labs (5/19): K 4.1, creatinine 1.7 Labs (6/19): hgb 8.2 Labs (8/19): K 4.3,  creatinine 1.73 Labs (9/19): K 4, creatinine 1.56 Labs (11/19): K 4.7, creatinine 2.04 Labs (12/19): creatinine 1.44 Labs (7/20): K 5, creatinine 2.49 => 1.86, Hgb 11.1 Labs (8/20): K 3.9, creatinine 1.79 Labs (9/20): LDL 50 Labs (11/20): K 3.8, creatinine 2.01 Labs (2/21): K 4, creatinine 1.9 Labs (7/21): K 4.2, creatinine 1.84, hgb 11.2 Labs (9/21): myeloma panel negative, urine immunofixation negative, LDL 50, K 4.6, creatinine 1.76 Labs (3/22): hgb 13.4, K 4.3, creatinine 1.86 Labs (5/22): K 4.5, creatinine 1.98 Labs (12/22): K 4.6, creatinine 1.71, LDL 47 Labs (8/23): K 4.5, creatinine 1.92, hgb 12.1 Labs (12/23): BNP 85, K 4.5, creatinine 1.64  Review of systems complete and found to be negative unless listed in HPI.    Past Medical History 1. Chronic systolic CHF: Ischemic cardiomyopathy with prominent RV failure.   - Echo (11/18): LVEF 35-40%, severe RV dilation, severe RAE, severe TR.  - Echo (7/19): EF 45-50%, diffuse hypokinesis, mild RV dilation with mildly decreased RV systolic function, D-shaped interventricular septum suggestive of RV pressure/volume overload, mild MR, moderate TR, PASP 49 mmhg.  - Echo (9/20): EF 45%, mild LV dilation, mild RV dilation with mildly decreased systolic function, PASP 46 mmHg, IVC dilated.  - PYP scan (10/21): Negative.  - Echo (4/22): EF 45% with diffuse hypokinesis, mildly decreased RV systolic function, PASP 47, IVC dilated, moderate TR, mild MR. - Echo (3/24): EF 50%, mildly decreased RV systolic function, dilated IVC, PASP 47 mmHg.  2. CAD: CABG 2009. No angiography since that time.   3. HTN 4. Atrial fibrillation: Chronic since 2013.  5. CKD stage 3 6. Morbid obesity 7. OSA 8. Chronic venous stasis with RLE wound/Bullae 9. CVA 5/18.   Current Outpatient Medications  Medication Sig Dispense Refill   acetaminophen (TYLENOL 8 HOUR) 650 MG CR tablet Take 1 tablet (650 mg total) by mouth every 8 (eight) hours as needed for pain. 30  tablet 0   apixaban (ELIQUIS) 2.5 MG TABS tablet Take 1 tablet (2.5 mg total) by mouth 2 (two) times daily. 180 tablet 3   atorvastatin (LIPITOR) 40 MG tablet TAKE 1 TABLET DAILY 100 tablet 2   Blood Glucose Monitoring Suppl (ONE TOUCH ULTRA 2) w/Device KIT USE TO CHECK BLOOD SUGAR UPTO 3 TIMES A DAY 1 kit 0   carvedilol (COREG) 6.25 MG tablet TAKE 1 TABLET TWICE A DAY  WITH MEALS 180 tablet 3   Cholecalciferol 25 MCG (1000 UT) tablet Take 1,000 Units by mouth daily.     docusate sodium (COLACE) 100 MG capsule Take 100 mg by mouth at bedtime.     Dulaglutide (TRULICITY) [id number] [id number] SOPN Inject 0.75 mg into the skin once a week. 6 mL 0   DULoxetine (CYMBALTA) 20 MG capsule TAKE 1 CAPSULE DAILY. 90 capsule 3   ferrous sulfate 325 (65 FE) MG tablet TAKE 1 TABLET BY MOUTH EVERY DAY WITH BREAKFAST 90 tablet 1   glucose blood (ONE TOUCH ULTRA TEST) test strip Use three times a day 100 each 3   glucose blood test strip Use as instructed to check blood glucose up to 4x daily 100 each 12   JARDIANCE 10 MG TABS tablet TAKE 1 TABLET DAILY BEFORE BREAKFAST 100 tablet 2   Lancets (ONETOUCH DELICA PLUS [id number]) MISC Please use to check blood sugar up to four times daily. 100 each 6   Multiple Vitamins-Minerals (PRESERVISION AREDS 2+MULTI VIT PO) Take 1 capsule by mouth in the morning and at bedtime.     potassium chloride SA (KLOR-CON M) 20 MEQ tablet Take 20 mEq by mouth. 60 meq in the AM and 40 meq in the PM     rOPINIRole (REQUIP) 0.5 MG tablet TAKE 2 TABLETS AT BEDTIME 180 tablet 3   spironolactone (ALDACTONE) 25 MG tablet Take 0.5 tablets (12.5 mg total) by mouth daily. 50 tablet 1   vitamin B-12 (CYANOCOBALAMIN) 1000 MCG tablet Take 1 tablet (1,000 mcg total) by mouth daily. 90 tablet 1   zinc oxide 20 % ointment Apply 1 application topically as needed (buttocks irritation). 425 g 1   metolazone (ZAROXOLYN) 2.5 MG tablet Take only as directed by CHF clinic (Patient not taking: Reported on 01/14/2023)  10 tablet 0   nitroGLYCERIN (NITROSTAT) 0.4 MG SL tablet PLACE 1 TABLET (0.4 MG TOTAL) UNDER THE TONGUE EVERY 5 (FIVE) MINUTES AS NEEDED FOR CHEST PAIN (UP TO 3 DOSES). (Patient not taking: Reported on 01/14/2023) 15 tablet 5   torsemide (DEMADEX) 20 MG tablet Take 4 tablets (80 mg total) by mouth 2 (two) times daily. 450 tablet 3   No current facility-administered medications for this encounter.   Allergies  Allergen Reactions   Benazepril Other (See Comments)    Unknown reaction at age 98-65 - possibly dizziness   Cymbalta [Duloxetine Hcl]     Itch, Dry mouth    Ozempic (0.25 Or 0.5 Mg-Dose) [Semaglutide(0.25 Or 0.'5mg'$ -Dos)] Diarrhea    Gi intollerance   Tape     TOLERATES PAPER  TAPE ONLY- due to thin skin. NOT ALLERGY PER PT   Angiotensin Receptor Blockers Other (See Comments)    Hypotension reaction   Bactrim [Sulfamethoxazole-Trimethoprim] Other (See Comments)    Unknown reaction   Fe-Succ-C-Thre-B12-Des Stomach Other (See Comments)    Unknown reaction  Ferocon?   Social History   Socioeconomic History   Marital status: Widowed    Spouse name: Not on file   Number of children: 2   Years of education: 30   Highest education level: Not on file  Occupational History    Comment: retired  Tobacco Use   Smoking status: Never   Smokeless tobacco: Never  Vaping Use   Vaping Use: Never used  Substance and Sexual Activity   Alcohol use: No   Drug use: Never   Sexual activity: Not Currently  Other Topics Concern   Not on file  Social History Narrative   Widowed   01/27/21 Lives with son,  in [place]   2 children   Not routinely exercising   Social Determinants of Health   Financial Resource Strain: Not on file  Food Insecurity: Not on file  Transportation Needs: No Transportation Needs (09/12/2022)   PRAPARE - Hydrologist (Medical): No    Lack of Transportation (Non-Medical): No  Physical Activity: Not on file  Stress: Not on file   Social Connections: Not on file  Intimate Partner Violence: Not on file   Family History  Problem Relation Age of Onset   Clotting disorder Mother        Cerebral hemorrhage   Other Mother        cerebral hemorrhage   Emphysema Father        COD   Lung cancer Brother    Heart disease Neg Hx    BP 126/74   Pulse 63   Ht '5\' 9"'$  (1.753 m)   Wt 98 kg (216 lb)   LMP  (LMP Unknown)   SpO2 98%   BMI 31.90 kg/m   Wt Readings from Last 3 Encounters:  01/14/23 98 kg (216 lb)  10/12/22 91.3 kg (201 lb 3.2 oz)  08/13/22 92 kg (202 lb 12.8 oz)    PHYSICAL EXAM:  General: NAD Neck: JVP 8-9 cm with HJR, no thyromegaly or thyroid nodule.  Lungs: Clear to auscultation bilaterally with normal respiratory effort. CV: Nondisplaced PMI.  Heart irregular S1/S2, no S3/S4, no murmur.  1+ ankle edema.  No carotid bruit.  Normal pedal pulses.  Abdomen: Soft, nontender, no hepatosplenomegaly, no distention.  Skin: Intact without lesions or rashes.  Neurologic: Alert and oriented x 3.  Psych: Normal affect. Extremities: No clubbing or cyanosis.  HEENT: Normal.   ASSESSMENT & PLAN: 1. Chronic systolic CHF with prominent RV failure: Ischemic cardiomyopathy.  Echo in 7/19 showed LV EF 45-50% (improved) with mildly dilated/mildly dysfunction RV.  D-shaped septum suggested RV pressure/volume overload.  Echo in 9/20 showed EF 45%, mildly dysfunctional RV, PASP 46 mmHg with dilated IVC.  PYP scan negative and myeloma workup negative, no evidence for cardiac amyloidosis.  Echo in 4/22 showed EF 45% with diffuse hypokinesis, mildly decreased RV systolic function, PASP 47, IVC dilated, moderate TR, mild MR.  Echo today showed EF 50%, mildly decreased RV systolic function, dilated IVC, PASP 47 mmHg. She is volume overloaded on exam, NYHA class III.  - Continue Jardiance 10 mg daily.  - Increase torsemide to 80 mg bid with BMET/BNP today and BMET in 10 days.  -  Continue Coreg 6.25 mg bid.   - Continue  spironolactone 12.5 mg daily.   - I recommended Cardiomems placement.  She is now interested in having this done.  We discussed risks/benefits and will see if we can get approval.  - Wear graded compression stockings.  2. CAD s/p CABG: No chest pain.  - Continue atorvastatin 40 mg daily.  - No ASA with stable CAD on Eliquis.  3. Atrial fibrillation: Chronic. Given long-term atrial fibrillation, she is unlikely to successfully cardiovert.  - Continue Eliquis 2.5 mg bid.   4. CKD stage 3: BMET today.  5. H/o CVA: Continue Eliquis.   6. OSA: She cannot tolerate CPAP.  7. HTN: BP controlled.    Follow up 3 wks with APP to reassess volume.   Signed, Loralie Champagne, MD  01/14/2023  [facility] [address] [facility] and [place] [address] [phone] (office) [phone] (fax)

## 2023-01-14 NOTE — Progress Notes (Signed)
Echocardiogram 2D Echocardiogram has been performed.  [name] 01/14/2023, 8:59 AM

## 2023-01-14 NOTE — Progress Notes (Signed)
  Care Coordination   Note   01/14/2023 Name: [name] MRN: [id number] DOB: 1940-12-24  [name] is a 82 y.o. year old female who sees Alcus Dad, MD for primary care. I reached out to [name] by phone today to offer care coordination services.  [name] was given information about Care Coordination services today including:   The Care Coordination services include support from the care team which includes your Nurse Coordinator, Clinical Social Worker, or Pharmacist.  The Care Coordination team is here to help remove barriers to the health concerns and goals most important to you. Care Coordination services are voluntary, and the patient may decline or stop services at any time by request to their care team member.   Care Coordination Consent Status: Patient agreed to services and verbal consent obtained.   Follow up plan:  Telephone appointment with care coordination team member scheduled for:  01/22/23  Encounter Outcome:  Pt. Scheduled  [place]  Direct Dial: [phone]

## 2023-01-14 NOTE — Patient Instructions (Addendum)
INCREASE Torsemide to 80 mg Twice daily  Labs done today, your results will be available in MyChart, we will contact you for abnormal readings.  Your physician recommends that you schedule a follow-up appointment in: 3 weeks  If you have any questions or concerns before your next appointment please send [place] a message through [place] or call our office at [phone].    TO LEAVE A MESSAGE FOR THE NURSE SELECT OPTION 2, PLEASE LEAVE A MESSAGE INCLUDING: YOUR NAME DATE OF BIRTH CALL BACK NUMBER REASON FOR CALL**this is important as we prioritize the call backs  YOU WILL RECEIVE A CALL BACK THE SAME DAY AS LONG AS YOU CALL BEFORE 4:00 PM  At the [facility], you and your health needs are our priority. As part of our continuing mission to provide you with exceptional heart care, we have created designated Provider Care Teams. These Care Teams include your primary Cardiologist (physician) and Advanced Practice Providers (APPs- Physician Assistants and Nurse Practitioners) who all work together to provide you with the care you need, when you need it.   You may see any of the following providers on your designated Care Team at your next follow up: Dr Glori Bickers Dr Loralie Champagne Dr. Roxana Hires, NP Lyda Jester, [place] [facility] [place], [place] Forestine Na, NP Audry Riles, PharmD   Please be sure to bring in all your medications bottles to every appointment.    Thank you for choosing [facility]

## 2023-01-22 ENCOUNTER — Ambulatory Visit: Payer: Self-pay

## 2023-01-22 NOTE — Patient Outreach (Unsigned)
  Care Coordination   Initial Visit Note   01/22/2023 Name: [name] MRN: [id number] DOB: May 06, 1941  [name] is a 82 y.o. year old female who sees Alcus Dad, MD for primary care. I spoke with  [name] by phone today.  What matters to the patients health and wellness today?  [name] lives with her son and is currently facing balance issues due to a stroke and problems with her left side. She uses a standing walker to assist her with mobility. She receives assistance from an aide who comes to her home on Mondays, Wednesdays, and Fridays. Her last  A1c was measured at 7.3, and her blood sugar readings have been ranging between 115 and 130. We also spoke about her weight, and she feels that she has lost weight since her last visit to the cardiologist's office on March 4th, 2024. However, she did not have any complaints of shortness of breath during our conversation. She is taking her medications as prescribed.    Goals Addressed             This Visit's Progress    I want to taake care of my health my HTN and DM IiI       Patient Goals/Self Care Activities: -Patient/Caregiver will self-administer medications as prescribed as evidenced by self-report/primary caregiver report  -Patient/Caregiver will attend all scheduled provider appointments as evidenced by clinician review of documented attendance to scheduled appointments and patient/caregiver report -Patient/Caregiver will call pharmacy for medication refills as evidenced by patient report and review of pharmacy fill history as appropriate -Patient/Caregiver will call provider office for new concerns or questions as evidenced by review of documented incoming telephone call notes and patient report -Patient/Caregiver verbalizes understanding of plan -Patient/Caregiver will focus on medication adherence by taking medications as prescribed  -Calls provider office for new concerns, questions, or BP outside discussed  parameters -Checks BP and records as discussed -Follows a low sodium diet/DASH diet -check blood sugar at prescribed times -check blood sugar if I feel it is too high or too low -record values and write them down take them to all doctor visits           SDOH assessments and interventions completed:  Yes  SDOH Interventions Today    Flowsheet Row Most Recent Value  SDOH Interventions   Food Insecurity Interventions Intervention Not Indicated  Transportation Interventions Intervention Not Indicated        Care Coordination Interventions:  Yes, provided   Interventions Today    Flowsheet Row Most Recent Value  Chronic Disease   Chronic disease during today's visit Diabetes, Hypertension (HTN)  General Interventions   General Interventions Discussed/Reviewed General Interventions Discussed, General Interventions Reviewed, Doctor Visits  Doctor Visits Discussed/Reviewed Doctor Visits Discussed  Education Interventions   Education Provided Provided Education  Provided Verbal Education On Blood Sugar Monitoring  Safety Interventions   Safety Discussed/Reviewed Safety Discussed        Follow up plan: Follow up call scheduled for 02/12/23  930 m    Encounter Outcome:  Pt. Visit Completed   Lazaro Arms RN, BSN, [facility]   Phone: [phone]

## 2023-01-23 NOTE — Patient Instructions (Signed)
Visit Information  Thank you for taking time to visit with me today. Please don't hesitate to contact me if I can be of assistance to you.   Following are the goals we discussed today:   Goals Addressed             This Visit's Progress    I want to taake care of my health my HTN and DM IiI       Patient Goals/Self Care Activities: -Patient/Caregiver will self-administer medications as prescribed as evidenced by self-report/primary caregiver report  -Patient/Caregiver will attend all scheduled provider appointments as evidenced by clinician review of documented attendance to scheduled appointments and patient/caregiver report -Patient/Caregiver will call pharmacy for medication refills as evidenced by patient report and review of pharmacy fill history as appropriate -Patient/Caregiver will call provider office for new concerns or questions as evidenced by review of documented incoming telephone call notes and patient report -Patient/Caregiver verbalizes understanding of plan -Patient/Caregiver will focus on medication adherence by taking medications as prescribed  -Calls provider office for new concerns, questions, or BP outside discussed parameters -Checks BP and records as discussed -Follows a low sodium diet/DASH diet -check blood sugar at prescribed times -check blood sugar if I feel it is too high or too low -record values and write them down take them to all doctor visits           Our next appointment is by telephone on 02/12/23 at 930 am  Please call the care guide team at [phone] if you need to cancel or reschedule your appointment.   If you are experiencing a Mental Health or [place] or need someone to talk to, please call 1-800-273-TALK (toll free, 24 hour hotline)  The patient verbalized understanding of instructions, educational materials, and care plan provided today.    Lazaro Arms RN, BSN, [facility]    Phone: [phone]

## 2023-01-24 ENCOUNTER — Ambulatory Visit: Payer: Medicare HMO | Admitting: Podiatry

## 2023-01-24 VITALS — BP 126/78

## 2023-01-24 DIAGNOSIS — B351 Tinea unguium: Secondary | ICD-10-CM

## 2023-01-24 DIAGNOSIS — M79674 Pain in right toe(s): Secondary | ICD-10-CM | POA: Diagnosis not present

## 2023-01-24 DIAGNOSIS — E1151 Type 2 diabetes mellitus with diabetic peripheral angiopathy without gangrene: Secondary | ICD-10-CM | POA: Diagnosis not present

## 2023-01-24 DIAGNOSIS — M79675 Pain in left toe(s): Secondary | ICD-10-CM

## 2023-01-24 NOTE — Progress Notes (Signed)
  Subjective:  Patient ID: [name], female    DOB: 1941/08/18,  MRN: [id number]  [name] presents to clinic today for at risk foot care. Pt has h/o NIDDM with PAD  Chief Complaint  Patient presents with   Nail Problem    Routine foot care, nail trim, last seen PCP 2 weeks ago    New problem(s): None.   PCP is Alcus Dad, MD.  Allergies  Allergen Reactions   Benazepril Other (See Comments)    Unknown reaction at age [age] - possibly dizziness   Cymbalta [Duloxetine Hcl]     Itch, Dry mouth    Ozempic (0.25 Or 0.5 Mg-Dose) [Semaglutide(0.25 Or 0.5mg -Dos)] Diarrhea    Gi intollerance   Tape     TOLERATES PAPER TAPE ONLY- due to thin skin. NOT ALLERGY PER PT   Angiotensin Receptor Blockers Other (See Comments)    Hypotension reaction   Bactrim [Sulfamethoxazole-Trimethoprim] Other (See Comments)    Unknown reaction   Fe-Succ-C-Thre-B12-Des Stomach Other (See Comments)    Unknown reaction  Ferocon?   Review of Systems: Negative except as noted in the HPI.  Objective: No changes noted in today's physical examination. Vitals:   01/24/23 1617  BP: 126/78   MARIELOUISE [name] is a pleasant 82 y.o. female obese in NAD. AAO x 3.  Vascular Examination: CFT <3 seconds b/l. DP pulses faintly palpable b/l. PT pulses diminished b/l. Digital hair absent. Skin temperature gradient warm to warm b/l. No ischemia or gangrene. No cyanosis or clubbing noted b/l.    Neurological Examination: Sensation grossly intact b/l with 10 gram monofilament. Vibratory sensation intact b/l.   Dermatological Examination: Pedal skin warm and supple b/l. Toenails 1-5 b/l thick, discolored, elongated with subungual debris and pain on dorsal palpation.    Musculoskeletal Examination: Muscle strength 5/5 to b/l LE. HAV with bunion bilaterally and hammertoes 2-5 b/l. Utilizes walker for ambulation assistance.  Radiographs: None  Assessment/Plan: 1. Pain due to onychomycosis of toenails of  both feet   2. Diabetes mellitus type 2 with peripheral artery disease ([place])     -Consent given for treatment as described below: -Examined patient. -Continue foot and shoe inspections daily. Monitor blood glucose per PCP/Endocrinologist's recommendations. -Patient to continue soft, supportive shoe gear daily. -Mycotic toenails 1-5 bilaterally were debrided in length and girth with sterile nail nippers and dremel without incident. -Patient/POA to call should there be question/concern in the interim.   Return in about 3 months (around 04/26/2023).  Marzetta Board, DPM

## 2023-01-27 ENCOUNTER — Encounter: Payer: Self-pay | Admitting: Podiatry

## 2023-01-29 ENCOUNTER — Encounter (INDEPENDENT_AMBULATORY_CARE_PROVIDER_SITE_OTHER): Payer: Medicare HMO | Admitting: Ophthalmology

## 2023-01-29 DIAGNOSIS — E113493 Type 2 diabetes mellitus with severe nonproliferative diabetic retinopathy without macular edema, bilateral: Secondary | ICD-10-CM | POA: Diagnosis not present

## 2023-01-29 DIAGNOSIS — H353114 Nonexudative age-related macular degeneration, right eye, advanced atrophic with subfoveal involvement: Secondary | ICD-10-CM | POA: Diagnosis not present

## 2023-01-29 DIAGNOSIS — H43822 Vitreomacular adhesion, left eye: Secondary | ICD-10-CM | POA: Diagnosis not present

## 2023-01-29 DIAGNOSIS — H353122 Nonexudative age-related macular degeneration, left eye, intermediate dry stage: Secondary | ICD-10-CM | POA: Diagnosis not present

## 2023-01-29 DIAGNOSIS — H353113 Nonexudative age-related macular degeneration, right eye, advanced atrophic without subfoveal involvement: Secondary | ICD-10-CM | POA: Diagnosis not present

## 2023-01-29 DIAGNOSIS — H353232 Exudative age-related macular degeneration, bilateral, with inactive choroidal neovascularization: Secondary | ICD-10-CM | POA: Diagnosis not present

## 2023-02-05 ENCOUNTER — Encounter (HOSPITAL_COMMUNITY): Payer: Medicare HMO

## 2023-02-05 ENCOUNTER — Other Ambulatory Visit: Payer: Self-pay | Admitting: Family Medicine

## 2023-02-05 DIAGNOSIS — E1142 Type 2 diabetes mellitus with diabetic polyneuropathy: Secondary | ICD-10-CM

## 2023-02-06 ENCOUNTER — Other Ambulatory Visit (HOSPITAL_COMMUNITY): Payer: Self-pay | Admitting: Cardiology

## 2023-02-12 ENCOUNTER — Ambulatory Visit: Payer: Self-pay

## 2023-02-12 NOTE — Patient Instructions (Signed)
Visit Information  Thank you for taking time to visit with me today. Please don't hesitate to contact me if I can be of assistance to you.   Following are the goals we discussed today:   Goals Addressed             This Visit's Progress    I want to taake care of my health my HTN and DM IiI       Patient Goals/Self Care Activities: -Patient/Caregiver will self-administer medications as prescribed as evidenced by self-report/primary caregiver report  -Patient/Caregiver will attend all scheduled provider appointments as evidenced by clinician review of documented attendance to scheduled appointments and patient/caregiver report -Patient/Caregiver will call pharmacy for medication refills as evidenced by patient report and review of pharmacy fill history as appropriate -Patient/Caregiver will call provider office for new concerns or questions as evidenced by review of documented incoming telephone call notes and patient report -Patient/Caregiver verbalizes understanding of plan -Patient/Caregiver will focus on medication adherence by taking medications as prescribed  -Calls provider office for new concerns, questions, or BP outside discussed parameters -Checks BP and records as discussed -Follows a low sodium diet/DASH diet -check blood sugar at prescribed times -record values and write them down take them to all doctor visits   -have the nurse to record your BP for you          Our next appointment is by telephone on 03/14/23 at 930 am  Please call the care guide team at [phone] if you need to cancel or reschedule your appointment.   If you are experiencing a Mental Health or [place] or need someone to talk to, please call 1-800-273-TALK (toll free, 24 hour hotline)  The patient verbalized understanding of instructions, educational materials, and care plan provided today.    Lazaro Arms RN, BSN, [facility]   Phone:  [phone]

## 2023-02-12 NOTE — Patient Outreach (Signed)
  Care Coordination   Follow Up Visit Note   02/12/2023 Name: [name] MRN: [id number] DOB: 1941/08/10  [name] is a 82 y.o. year old female who sees [name] Dad, MD for primary care. I spoke with  [name] by phone today.  What matters to the patients health and wellness today?  She mentioned that she is doing well. Her blood sugar level was 132, and she couldn't check her blood pressure on her own, so she waits for the nurse who comes once a week to take her bath and manage her medication. She is sleeping comfortably, but she has to sleep in the recliner since her shoulders don't allow her to get out of bed. She has successfully lost 13 pounds of fluid by taking her fluid pills, keeping track of her diet for salt and fluid intake.    Goals Addressed             This Visit's Progress    I want to taake care of my health my HTN and DM IiI       Patient Goals/Self Care Activities: -Patient/Caregiver will self-administer medications as prescribed as evidenced by self-report/primary caregiver report  -Patient/Caregiver will attend all scheduled provider appointments as evidenced by clinician review of documented attendance to scheduled appointments and patient/caregiver report -Patient/Caregiver will call pharmacy for medication refills as evidenced by patient report and review of pharmacy fill history as appropriate -Patient/Caregiver will call provider office for new concerns or questions as evidenced by review of documented incoming telephone call notes and patient report -Patient/Caregiver verbalizes understanding of plan -Patient/Caregiver will focus on medication adherence by taking medications as prescribed  -Calls provider office for new concerns, questions, or BP outside discussed parameters -Checks BP and records as discussed -Follows a low sodium diet/DASH diet -check blood sugar at prescribed times -record values and write them down take them to all doctor  visits   -have the nurse to record your BP for you          SDOH assessments and interventions completed:  No     Care Coordination Interventions:  Yes, provided   Interventions Today    Flowsheet Row Most Recent Value  Chronic Disease   Chronic disease during today's visit Diabetes, Hypertension (HTN)  General Interventions   General Interventions Discussed/Reviewed General Interventions Reviewed  Education Interventions   Provided Verbal Education On Nutrition, Blood Sugar Monitoring, Medication  Safety Interventions   Safety Discussed/Reviewed Safety Discussed       Follow up plan: Follow up call scheduled for 03/14/23 930 am     Encounter Outcome:  Pt. Visit Completed   Lazaro Arms RN, BSN, [facility]   Phone: [phone]

## 2023-02-20 ENCOUNTER — Encounter (HOSPITAL_COMMUNITY): Payer: Self-pay

## 2023-02-20 ENCOUNTER — Ambulatory Visit (HOSPITAL_COMMUNITY)
Admission: RE | Admit: 2023-02-20 | Discharge: 2023-02-20 | Disposition: A | Discharge status: Home / Self Care | Payer: Medicare HMO | Source: Ambulatory Visit | Attending: Family Medicine | Admitting: Family Medicine

## 2023-02-20 VITALS — BP 104/64 | HR 69 | Wt 203.6 lb

## 2023-02-20 DIAGNOSIS — Z8719 Personal history of other diseases of the digestive system: Secondary | ICD-10-CM | POA: Insufficient documentation

## 2023-02-20 DIAGNOSIS — I2510 Atherosclerotic heart disease of native coronary artery without angina pectoris: Secondary | ICD-10-CM

## 2023-02-20 DIAGNOSIS — Z7901 Long term (current) use of anticoagulants: Secondary | ICD-10-CM | POA: Diagnosis not present

## 2023-02-20 DIAGNOSIS — I5022 Chronic systolic (congestive) heart failure: Secondary | ICD-10-CM | POA: Diagnosis not present

## 2023-02-20 DIAGNOSIS — I272 Pulmonary hypertension, unspecified: Secondary | ICD-10-CM | POA: Insufficient documentation

## 2023-02-20 DIAGNOSIS — I130 Hypertensive heart and chronic kidney disease with heart failure and stage 1 through stage 4 chronic kidney disease, or unspecified chronic kidney disease: Secondary | ICD-10-CM | POA: Insufficient documentation

## 2023-02-20 DIAGNOSIS — Z7985 Long-term (current) use of injectable non-insulin antidiabetic drugs: Secondary | ICD-10-CM | POA: Diagnosis not present

## 2023-02-20 DIAGNOSIS — I10 Essential (primary) hypertension: Secondary | ICD-10-CM | POA: Diagnosis not present

## 2023-02-20 DIAGNOSIS — E1122 Type 2 diabetes mellitus with diabetic chronic kidney disease: Secondary | ICD-10-CM | POA: Diagnosis not present

## 2023-02-20 DIAGNOSIS — Z79899 Other long term (current) drug therapy: Secondary | ICD-10-CM | POA: Diagnosis not present

## 2023-02-20 DIAGNOSIS — Z6830 Body mass index (BMI) 30.0-30.9, adult: Secondary | ICD-10-CM | POA: Insufficient documentation

## 2023-02-20 DIAGNOSIS — Z951 Presence of aortocoronary bypass graft: Secondary | ICD-10-CM | POA: Insufficient documentation

## 2023-02-20 DIAGNOSIS — Z7984 Long term (current) use of oral hypoglycemic drugs: Secondary | ICD-10-CM | POA: Insufficient documentation

## 2023-02-20 DIAGNOSIS — I255 Ischemic cardiomyopathy: Secondary | ICD-10-CM | POA: Insufficient documentation

## 2023-02-20 DIAGNOSIS — G4733 Obstructive sleep apnea (adult) (pediatric): Secondary | ICD-10-CM

## 2023-02-20 DIAGNOSIS — I4819 Other persistent atrial fibrillation: Secondary | ICD-10-CM

## 2023-02-20 DIAGNOSIS — I5032 Chronic diastolic (congestive) heart failure: Secondary | ICD-10-CM

## 2023-02-20 DIAGNOSIS — Z8673 Personal history of transient ischemic attack (TIA), and cerebral infarction without residual deficits: Secondary | ICD-10-CM | POA: Diagnosis not present

## 2023-02-20 DIAGNOSIS — N183 Chronic kidney disease, stage 3 unspecified: Secondary | ICD-10-CM

## 2023-02-20 LAB — BASIC METABOLIC PANEL
Anion gap: 13 (ref 5–15)
BUN: 67 mg/dL — ABNORMAL HIGH (ref 8–23)
CO2: 30 mmol/L (ref 22–32)
Calcium: 10.8 mg/dL — ABNORMAL HIGH (ref 8.9–10.3)
Chloride: 97 mmol/L — ABNORMAL LOW (ref 98–111)
Creatinine, Ser: 1.94 mg/dL — ABNORMAL HIGH (ref 0.44–1.00)
GFR, Estimated: 25 mL/min — ABNORMAL LOW (ref >60)
Glucose, Bld: 110 mg/dL — ABNORMAL HIGH (ref 70–99)
Potassium: 4.4 mmol/L (ref 3.5–5.1)
Sodium: 140 mmol/L (ref 135–145)

## 2023-02-20 LAB — BRAIN NATRIURETIC PEPTIDE: B Natriuretic Peptide: 68.4 pg/mL (ref 0.0–100.0)

## 2023-02-20 NOTE — Progress Notes (Signed)
ReDS Vest / Clip - 02/20/23 1500       ReDS Vest / Clip   Station Marker C    Ruler Value 31    ReDS Value Range Low volume    ReDS Actual Value 26

## 2023-02-20 NOTE — Progress Notes (Signed)
ID:  [name], DOB 1941-03-02, MRN [id number]   Provider location: [facility] [facility] Type of Visit: Established patient   PCP:  Maury Dus, MD  Primary Cardiologist:  Armanda Magic, MD HF Cardiologist: Dr. Shirlee Latch   History of Present Illness: [name] is a 82 y.o. female who has a history of CAD status post CABG 2009, ischemic cardiomyopathy with chronic systolic CHF, persistent atrial fibrillation with stroke in 03/2016 when INR was subtherapeutic, stage III CKD, morbid obesity, pulmonary hypertension, diabetes, GI bleed 03/2017, and severe sleep apnea.    Seen in [facility] clinics 09/10/18 and 09/24/18, both times with volume overload and marked peripheral edema with skin breakdown. Torsemide increased at each visit. Echo in 7/19 showed EF 45-50% with mild RV dilation/mild RV systolic dysfunction and D-shaped interventricular septum.    AKI earlier in 7/20 in setting of excessive torsemide, she had increased dose to 60 qam/40 qpm on her own.  This was cut back to 40 mg daily and losartan and spironolactone were stopped.   Echo in 9/20 showed EF 45% with mildly decreased RV systolic function, PASP 46 mmHg with dilated IVC.  PYP scan in 10/21 was negative.   Echo in 4/22 showed EF 45% with diffuse hypokinesis, mildly decreased RV systolic function, PASP 47, IVC dilated, moderate TR, mild MR.   Echo 3/24 showed EF 50%, mildly decreased RV systolic function, dilated IVC, PASP 47 mmHg.   Today she returns for HF follow up with her son. Overall feeling fine. Breathing has improved on increase torsemide dose, she remains SOB walking further distances on flat ground. Swelling improved. Denies palpitations, CP, dizziness, edema, or PND/Orthopnea. Appetite ok. No fever or chills. Weight at home 202 pounds. Taking all medications.   REDs: 26%  Labs (12/18): K 3.9, Creatinine 1.65 Labs (5/19): K 4.1, creatinine 1.7 Labs (6/19): hgb 8.2 Labs (8/19): K 4.3,  creatinine 1.73 Labs (9/19): K 4, creatinine 1.56 Labs (11/19): K 4.7, creatinine 2.04 Labs (12/19): creatinine 1.44 Labs (7/20): K 5, creatinine 2.49 => 1.86, Hgb 11.1 Labs (8/20): K 3.9, creatinine 1.79 Labs (9/20): LDL 50 Labs (11/20): K 3.8, creatinine 2.01 Labs (2/21): K 4, creatinine 1.9 Labs (7/21): K 4.2, creatinine 1.84, hgb 11.2 Labs (9/21): myeloma panel negative, urine immunofixation negative, LDL 50, K 4.6, creatinine 3.01 Labs (3/22): hgb 13.4, K 4.3, creatinine 1.86 Labs (5/22): K 4.5, creatinine 1.98 Labs (12/22): K 4.6, creatinine 1.71, LDL 47 Labs (8/23): K 4.5, creatinine 1.92, hgb 12.1 Labs (12/23): BNP 85, K 4.5, creatinine 1.64 Labs (3/24): K 4.5, creatinine 1.87, hgb 12.1, LDL 48  Review of systems complete and found to be negative unless listed in HPI.    Past Medical History 1. Chronic systolic CHF: Ischemic cardiomyopathy with prominent RV failure.   - Echo (11/18): LVEF 35-40%, severe RV dilation, severe RAE, severe TR.  - Echo (7/19): EF 45-50%, diffuse hypokinesis, mild RV dilation with mildly decreased RV systolic function, D-shaped interventricular septum suggestive of RV pressure/volume overload, mild MR, moderate TR, PASP 49 mmhg.  - Echo (9/20): EF 45%, mild LV dilation, mild RV dilation with mildly decreased systolic function, PASP 46 mmHg, IVC dilated.  - PYP scan (10/21): Negative.  - Echo (4/22): EF 45% with diffuse hypokinesis, mildly decreased RV systolic function, PASP 47, IVC dilated, moderate TR, mild MR. - Echo (3/24): EF 50%, mildly decreased RV systolic function, dilated IVC, PASP 47 mmHg.  2. CAD: CABG 2009. No angiography  since that time.   3. HTN 4. Atrial fibrillation: Chronic since 2013.  5. CKD stage 3 6. Morbid obesity 7. OSA 8. Chronic venous stasis with RLE wound/Bullae 9. CVA 5/18.   Current Outpatient Medications  Medication Sig Dispense Refill   acetaminophen (TYLENOL 8 HOUR) 650 MG CR tablet Take 1 tablet (650 mg total)  by mouth every 8 (eight) hours as needed for pain. 30 tablet 0   apixaban (ELIQUIS) 2.5 MG TABS tablet Take 1 tablet (2.5 mg total) by mouth 2 (two) times daily. 180 tablet 3   atorvastatin (LIPITOR) 40 MG tablet TAKE 1 TABLET DAILY 100 tablet 2   Blood Glucose Monitoring Suppl (ONE TOUCH ULTRA 2) w/Device KIT USE TO CHECK BLOOD SUGAR UPTO 3 TIMES A DAY 1 kit 0   carvedilol (COREG) 6.25 MG tablet TAKE 1 TABLET TWICE A DAY  WITH MEALS 180 tablet 3   Cholecalciferol 25 MCG (1000 UT) tablet Take 1,000 Units by mouth daily.     docusate sodium (COLACE) 100 MG capsule Take 100 mg by mouth at bedtime.     DULoxetine (CYMBALTA) 20 MG capsule TAKE 1 CAPSULE DAILY. 90 capsule 3   ferrous sulfate 325 (65 FE) MG tablet TAKE 1 TABLET BY MOUTH EVERY DAY WITH BREAKFAST 90 tablet 1   glucose blood (ONE TOUCH ULTRA TEST) test strip Use three times a day 100 each 3   glucose blood test strip Use as instructed to check blood glucose up to 4x daily 100 each 12   JARDIANCE 10 MG TABS tablet TAKE 1 TABLET DAILY BEFORE BREAKFAST 90 tablet 3   Lancets (ONETOUCH DELICA PLUS LANCET33G) MISC Please use to check blood sugar up to four times daily. 100 each 6   metolazone (ZAROXOLYN) 2.5 MG tablet Take only as directed by CHF clinic 10 tablet 0   Multiple Vitamins-Minerals (PRESERVISION AREDS 2+MULTI VIT PO) Take 1 capsule by mouth in the morning and at bedtime.     nitroGLYCERIN (NITROSTAT) 0.4 MG SL tablet PLACE 1 TABLET (0.4 MG TOTAL) UNDER THE TONGUE EVERY 5 (FIVE) MINUTES AS NEEDED FOR CHEST PAIN (UP TO 3 DOSES). 15 tablet 5   potassium chloride SA (KLOR-CON M) 20 MEQ tablet Take 20 mEq by mouth. 60 meq in the AM and 40 meq in the PM     rOPINIRole (REQUIP) 0.5 MG tablet TAKE 2 TABLETS AT BEDTIME 180 tablet 3   spironolactone (ALDACTONE) 25 MG tablet Take 0.5 tablets (12.5 mg total) by mouth daily. 50 tablet 1   torsemide (DEMADEX) 20 MG tablet Take 4 tablets (80 mg total) by mouth 2 (two) times daily. 450 tablet 3    TRULICITY 0.75 MG/0.5ML SOPN Inject 0.75 mg into the skin once a week. 6 mL 0   vitamin B-12 (CYANOCOBALAMIN) 1000 MCG tablet Take 1 tablet (1,000 mcg total) by mouth daily. 90 tablet 1   zinc oxide 20 % ointment Apply 1 application topically as needed (buttocks irritation). 425 g 1   No current facility-administered medications for this encounter.   Allergies  Allergen Reactions   Benazepril Other (See Comments)    Unknown reaction at age 72-65 - possibly dizziness   Cymbalta [Duloxetine Hcl]     Itch, Dry mouth    Ozempic (0.25 Or 0.5 Mg-Dose) [Semaglutide(0.25 Or 0.5mg -Dos)] Diarrhea    Gi intollerance   Tape     TOLERATES PAPER TAPE ONLY- due to thin skin. NOT ALLERGY PER PT   Angiotensin Receptor Blockers Other (See Comments)  Hypotension reaction   Bactrim [Sulfamethoxazole-Trimethoprim] Other (See Comments)    Unknown reaction   Fe-Succ-C-Thre-B12-Des Stomach Other (See Comments)    Unknown reaction  Ferocon?   Social History   Socioeconomic History   Marital status: Widowed    Spouse name: Not on file   Number of children: 2   Years of education: 8112   Highest education level: Not on file  Occupational History    Comment: retired  Tobacco Use   Smoking status: Never   Smokeless tobacco: Never  Vaping Use   Vaping Use: Never used  Substance and Sexual Activity   Alcohol use: No   Drug use: Never   Sexual activity: Not Currently  Other Topics Concern   Not on file  Social History Narrative   Widowed   01/27/21 Lives with [place],  in [place]   2 children   Not routinely exercising   Social Determinants of Health   Financial Resource Strain: Not on file  Food Insecurity: Not on file  Transportation Needs: No Transportation Needs (09/12/2022)   PRAPARE - Administrator, Civil ServiceTransportation    Lack of Transportation (Medical): No    Lack of Transportation (Non-Medical): No  Physical Activity: Not on file  Stress: Not on file  Social Connections: Not on file  Intimate Partner  Violence: Not on file   Family History  Problem Relation Age of Onset   Clotting disorder Mother        Cerebral hemorrhage   Other Mother        cerebral hemorrhage   Emphysema Father        COD   Lung cancer Brother    Heart disease Neg Hx    BP 104/64   Pulse 69   Wt 92.4 kg (203 lb 9.6 oz)   LMP  (LMP Unknown)   SpO2 99%   BMI 30.07 kg/m   Wt Readings from Last 3 Encounters:  02/20/23 92.4 kg (203 lb 9.6 oz)  01/14/23 98 kg (216 lb)  10/12/22 91.3 kg (201 lb 3.2 oz)    PHYSICAL EXAM:  General:  NAD. No resp difficulty, walked into clinic with rolling walker. HEENT: Normal Neck: Supple. No JVD. Carotids 2+ bilat; no bruits. No lymphadenopathy or thryomegaly appreciated. Cor: PMI nondisplaced. Irregular rate & rhythm. No rubs, gallops or murmurs. Lungs: Clear Abdomen: Soft, obese, nontender, nondistended. No hepatosplenomegaly. No bruits or masses. Good bowel sounds. Extremities: No cyanosis, clubbing, rash, trace LE edema compression hose on Neuro: Alert & oriented x 3, cranial nerves grossly intact. Moves all 4 extremities w/o difficulty. Affect pleasant.  ASSESSMENT & PLAN: 1. Chronic systolic CHF with prominent RV failure: Ischemic cardiomyopathy.  Echo in 7/19 showed LV EF 45-50% (improved) with mildly dilated/mildly dysfunction RV.  D-shaped septum suggested RV pressure/volume overload.  Echo in 9/20 showed EF 45%, mildly dysfunctional RV, PASP 46 mmHg with dilated IVC.  PYP scan negative and myeloma workup negative, no evidence for cardiac amyloidosis.  Echo in 4/22 showed EF 45% with diffuse hypokinesis, mildly decreased RV systolic function, PASP 47, IVC dilated, moderate TR, mild MR.  Echo 3/24 showed EF 50%, mildly decreased RV systolic function, dilated IVC, PASP 47 mmHg. NYHA class II-III. She is not volume overloaded today, weight down 13 lbs, REDs 26%. - Continue Jardiance 10 mg daily.  - Continue torsemide 80 mg bid, BMET and BNP today. - Continue Coreg 6.25  mg bid.   - Continue spironolactone 12.5 mg daily.   - Recommended Cardiomems placement, unfortunately not  approved with insurance.   - Continue compression stockings.  2. CAD s/p CABG: No chest pain.  - Continue atorvastatin 40 mg daily.  - No ASA with stable CAD on Eliquis.  3. Atrial fibrillation: Chronic. Given long-term atrial fibrillation, she is unlikely to successfully cardiovert.  - Continue Eliquis 2.5 mg bid.  No bleeding issues. 4. CKD stage 3: BMET today.  5. H/o CVA: Continue Eliquis.   6. OSA: She cannot tolerate CPAP.  7. HTN: BP controlled.   - Continue current meds.  Follow up in 3-4 months with Dr. Shirlee Latch.  Signed, Jacklynn Ganong, FNP  02/20/2023  [facility] [address] [facility] [place] [address] [phone] (office) [phone] (fax)

## 2023-02-20 NOTE — Patient Instructions (Signed)
It was great to see you today! No medication changes are needed at this time.   Labs today We will only contact you if something comes back abnormal or we need to make some changes. Otherwise no news is good news!  Your physician wants you to follow-up in: 3-4 months with Dr McLean You will receive a reminder letter in the mail two months in advance. If you don't receive a letter, please call our office to schedule the follow-up appointment.  Do the following things EVERYDAY: Weigh yourself in the morning before breakfast. Write it down and keep it in a log. Take your medicines as prescribed Eat low salt foods--Limit salt (sodium) to 2000 mg per day.  Stay as active as you can everyday Limit all fluids for the day to less than 2 liters   At the [facility], you and your health needs are our priority. As part of our continuing mission to provide you with exceptional heart care, we have created designated Provider Care Teams. These Care Teams include your primary Cardiologist (physician) and Advanced Practice Providers (APPs- Physician Assistants and Nurse Practitioners) who all work together to provide you with the care you need, when you need it.   You may see any of the following providers on your designated Care Team at your next follow up: Dr Daniel Bensimhon Dr Dalton McLean Dr. Aditya Sabharwal Amy Clegg, NP Brittainy Simmons, PA Jessica Milford,NP Lindsay Finch, PA Alma Diaz, NP Lauren Kemp, PharmD   Please be sure to bring in all your medications bottles to every appointment.    Thank you for choosing [facility]   If you have any questions or concerns before your next appointment please send us a message through mychart or call our office at [phone].    TO LEAVE A MESSAGE FOR THE NURSE SELECT OPTION 2, PLEASE LEAVE A MESSAGE INCLUDING: YOUR NAME DATE OF BIRTH CALL BACK NUMBER REASON FOR CALL**this is  important as we prioritize the call backs  YOU WILL RECEIVE A CALL BACK THE SAME DAY AS LONG AS YOU CALL BEFORE 4:00 PM  

## 2023-03-01 ENCOUNTER — Other Ambulatory Visit (HOSPITAL_COMMUNITY): Payer: Self-pay | Admitting: Cardiology

## 2023-03-06 ENCOUNTER — Other Ambulatory Visit (HOSPITAL_COMMUNITY): Payer: Self-pay | Admitting: Cardiology

## 2023-03-07 ENCOUNTER — Other Ambulatory Visit (HOSPITAL_COMMUNITY): Payer: Self-pay

## 2023-03-07 MED ORDER — POTASSIUM CHLORIDE CRYS ER 20 MEQ PO TBCR: 20.0000 meq | EXTENDED_RELEASE_TABLET | Freq: Every day | ORAL | 6 refills | Status: DC

## 2023-03-07 MED ORDER — POTASSIUM CHLORIDE CRYS ER 20 MEQ PO TBCR: EXTENDED_RELEASE_TABLET | ORAL | 6 refills | Status: DC

## 2023-03-08 ENCOUNTER — Other Ambulatory Visit (HOSPITAL_COMMUNITY): Payer: Self-pay | Admitting: Cardiology

## 2023-03-08 MED ORDER — POTASSIUM CHLORIDE CRYS ER 20 MEQ PO TBCR: EXTENDED_RELEASE_TABLET | ORAL | 11 refills | Status: DC

## 2023-03-14 ENCOUNTER — Ambulatory Visit: Payer: Self-pay

## 2023-03-14 NOTE — Patient Instructions (Signed)
Visit Information  Thank you for taking time to visit with me today. Please don't hesitate to contact me if I can be of assistance to you.   Following are the goals we discussed today:   Goals Addressed             This Visit's Progress    I want to taake care of my health my HTN and DM II       Patient Goals/Self Care Activities: -Patient/Caregiver will self-administer medications as prescribed as evidenced by self-report/primary caregiver report  -Patient/Caregiver will attend all scheduled provider appointments as evidenced by clinician review of documented attendance to scheduled appointments and patient/caregiver report -Patient/Caregiver will call pharmacy for medication refills as evidenced by patient report and review of pharmacy fill history as appropriate -Patient/Caregiver will call provider office for new concerns or questions as evidenced by review of documented incoming telephone call notes and patient report -Patient/Caregiver verbalizes understanding of plan -Patient/Caregiver will focus on medication adherence by taking medications as prescribed  -Calls provider office for new concerns, questions, or BP outside discussed parameters -Checks BP and records as discussed -Follows a low sodium diet/DASH diet -check blood sugar at prescribed times -record values and write them down take them to all doctor visits   -continue to have the nurse to record your BP for you          Our next appointment is by telephone on 04/17/23 at 230 pm  Please call the care guide team at [phone] if you need to cancel or reschedule your appointment.   If you are experiencing a Mental Health or Behavioral Health Crisis or need someone to talk to, please call 1-800-273-TALK (toll free, 24 hour hotline)  The patient verbalized understanding of instructions, educational materials, and care plan provided today.    Juanell Fairly RN, BSN, [facility] Care Coordinator [facility]    Phone: [phone]

## 2023-03-14 NOTE — Patient Outreach (Signed)
  Care Coordination   Follow Up Visit Note   03/14/2023 Name: SUZZANE QUILTER MRN: [id number] DOB: Mar 28, 1941  KLEA NALL is a 82 y.o. year old female who sees Maury Dus, MD for primary care. I spoke with  [name] by phone today.  What matters to the patients health and wellness today?  Mrs. Youngers is doing well. She is eating and sleeping properly. However, she takes a fluid pill which makes her get up three times a night. Due to her shoulder pain, she is not able to get out of bed and sleeps in a recliner. She uses an upright walker and the bathtub is close to her.    Goals Addressed             This Visit's Progress    I want to taake care of my health my HTN and DM II       Patient Goals/Self Care Activities: -Patient/Caregiver will self-administer medications as prescribed as evidenced by self-report/primary caregiver report  -Patient/Caregiver will attend all scheduled provider appointments as evidenced by clinician review of documented attendance to scheduled appointments and patient/caregiver report -Patient/Caregiver will call pharmacy for medication refills as evidenced by patient report and review of pharmacy fill history as appropriate -Patient/Caregiver will call provider office for new concerns or questions as evidenced by review of documented incoming telephone call notes and patient report -Patient/Caregiver verbalizes understanding of plan -Patient/Caregiver will focus on medication adherence by taking medications as prescribed  -Calls provider office for new concerns, questions, or BP outside discussed parameters -Checks BP and records as discussed -Follows a low sodium diet/DASH diet -check blood sugar at prescribed times -record values and write them down take them to all doctor visits   -continue to have the nurse to record your BP for you          SDOH assessments and interventions completed:  No     Care Coordination Interventions:  Yes,  provided   Interventions Today    Flowsheet Row Most Recent Value  Chronic Disease   Chronic disease during today's visit --  [Denies Heaches or chest pain, no diabetes symttoms.  Blood sugar today 129 yesterday 132.  No blood pressure she is unable to do it herself due to her shoulders so her pcs worker will do  it for her.]  General Interventions   General Interventions Discussed/Reviewed General Interventions Discussed  Pharmacy Interventions   Pharmacy Dicussed/Reviewed Pharmacy Topics Discussed  [Diuretics]  Safety Interventions   Safety Discussed/Reviewed Safety Discussed, Fall Risk  [Stand up walker]        Follow up plan: Follow up call scheduled for 04/17/23  230 pm    Encounter Outcome:  Pt. Visit Completed   Juanell Fairly RN, BSN, [facility] Care Coordinator [facility]   Phone: [phone]

## 2023-04-16 ENCOUNTER — Other Ambulatory Visit (HOSPITAL_COMMUNITY): Payer: Self-pay | Admitting: Cardiology

## 2023-04-17 ENCOUNTER — Ambulatory Visit: Payer: Self-pay

## 2023-04-18 ENCOUNTER — Telehealth: Payer: Self-pay

## 2023-04-18 NOTE — Patient Outreach (Signed)
  Care Coordination   Follow Up Visit Note   04/18/2023 Name: HAJRA CROKE MRN: [id number] DOB: Feb 16, 1941  [name] is a 82 y.o. year old female who sees Maury Dus, MD for primary care. I spoke with  [name] by phone today.  What matters to the patients health and wellness today?  I received a message from Mrs. Roti stating that she had not spoken with me. When I returned her call, she clarified that she had forgotten our conversation from yesterday. She reassured me that everything was fine and attributed her forgetfulness to old age.     SDOH assessments and interventions completed:  No     Care Coordination Interventions:  No, not indicated   Follow up plan:  at next scheduled interval    Encounter Outcome:  Pt. Visit Completed   Juanell Fairly RN, BSN, [facility] Care Coordinator [facility]   Phone: [phone]

## 2023-04-18 NOTE — Patient Instructions (Signed)
Visit Information  Thank you for taking time to visit with me today. Please don't hesitate to contact me if I can be of assistance to you.   Following are the goals we discussed today:   Goals Addressed             This Visit's Progress    I want to taake care of my health my HTN and DM II       Patient Goals/Self Care Activities: -Patient/Caregiver will self-administer medications as prescribed as evidenced by self-report/primary caregiver report  -Patient/Caregiver will attend all scheduled provider appointments as evidenced by clinician review of documented attendance to scheduled appointments and patient/caregiver report -Patient/Caregiver will call pharmacy for medication refills as evidenced by patient report and review of pharmacy fill history as appropriate -Patient/Caregiver will call provider office for new concerns or questions as evidenced by review of documented incoming telephone call notes and patient report -Patient/Caregiver verbalizes understanding of plan -Patient/Caregiver will focus on medication adherence by taking medications as prescribed  -Calls provider office for new concerns, questions, or BP outside discussed parameters --Follows a low sodium diet/DASH diet -check blood sugar at prescribed times -record values and write them down take them to all doctor visits   -continue to have the nurse to record your BP for you          Our next appointment is by telephone on 06/19/23 at 130 pm  Please call the care guide team at [phone] if you need to cancel or reschedule your appointment.   If you are experiencing a Mental Health or Behavioral Health Crisis or need someone to talk to, please call 1-800-273-TALK (toll free, 24 hour hotline)  The patient verbalized understanding of instructions, educational materials, and care plan provided today and agreed to receive a mailed copy of patient instructions, educational materials, and care plan.   Juanell Fairly  RN, BSN, [facility] Care Coordinator [facility]   Phone: [phone]

## 2023-04-18 NOTE — Patient Outreach (Signed)
  Care Coordination   Follow Up Visit Note   04/18/2023 Name: [name] MRN: [id number] DOB: 1941-10-11  [name] is a 82 y.o. year old female who sees Maury Dus, MD for primary care. I spoke with  [name] by phone today.  What matters to the patients health and wellness today?  Mrs. Voller has been proactive in managing her health. She has started sleeping in her bed again and has purchased a handle to help her get out of bed, which has been effective. Her blood pressure, checked by a nurse, was 127/80, and her blood sugar levels have been between 124 and 140. She is maintaining a healthy diet, sleeping well, and has no issues with her medications. She has a scheduled appointment with her physician at the end of the month.     Goals Addressed             This Visit's Progress    I want to taake care of my health my HTN and DM II       Patient Goals/Self Care Activities: -Patient/Caregiver will self-administer medications as prescribed as evidenced by self-report/primary caregiver report  -Patient/Caregiver will attend all scheduled provider appointments as evidenced by clinician review of documented attendance to scheduled appointments and patient/caregiver report -Patient/Caregiver will call pharmacy for medication refills as evidenced by patient report and review of pharmacy fill history as appropriate -Patient/Caregiver will call provider office for new concerns or questions as evidenced by review of documented incoming telephone call notes and patient report -Patient/Caregiver verbalizes understanding of plan -Patient/Caregiver will focus on medication adherence by taking medications as prescribed  -Calls provider office for new concerns, questions, or BP outside discussed parameters --Follows a low sodium diet/DASH diet -check blood sugar at prescribed times -record values and write them down take them to all doctor visits   -continue to have the nurse to  record your BP for you          SDOH assessments and interventions completed:  No     Care Coordination Interventions:  Yes, provided   Interventions Today    Flowsheet Row Most Recent Value  Chronic Disease   Chronic disease during today's visit Diabetes, Hypertension (HTN)  General Interventions   General Interventions Discussed/Reviewed General Interventions Discussed, General Interventions Reviewed  Education Interventions   Education Provided Provided Education  Pharmacy Interventions   Pharmacy Dicussed/Reviewed Pharmacy Topics Discussed  Safety Interventions   Safety Discussed/Reviewed Safety Discussed        Follow up plan: Follow up call scheduled for 06/19/23  130 pm    Encounter Outcome:  Pt. Visit Completed   Juanell Fairly RN, BSN, [facility] Care Coordinator [facility]   Phone: [phone]

## 2023-04-28 ENCOUNTER — Other Ambulatory Visit: Payer: Self-pay | Admitting: Family Medicine

## 2023-04-28 DIAGNOSIS — E1142 Type 2 diabetes mellitus with diabetic polyneuropathy: Secondary | ICD-10-CM

## 2023-04-30 ENCOUNTER — Other Ambulatory Visit (HOSPITAL_COMMUNITY): Payer: Self-pay | Admitting: Cardiology

## 2023-04-30 DIAGNOSIS — H43822 Vitreomacular adhesion, left eye: Secondary | ICD-10-CM | POA: Diagnosis not present

## 2023-04-30 DIAGNOSIS — H353222 Exudative age-related macular degeneration, left eye, with inactive choroidal neovascularization: Secondary | ICD-10-CM | POA: Diagnosis not present

## 2023-04-30 DIAGNOSIS — E113493 Type 2 diabetes mellitus with severe nonproliferative diabetic retinopathy without macular edema, bilateral: Secondary | ICD-10-CM | POA: Diagnosis not present

## 2023-04-30 DIAGNOSIS — H353113 Nonexudative age-related macular degeneration, right eye, advanced atrophic without subfoveal involvement: Secondary | ICD-10-CM | POA: Diagnosis not present

## 2023-04-30 DIAGNOSIS — H353114 Nonexudative age-related macular degeneration, right eye, advanced atrophic with subfoveal involvement: Secondary | ICD-10-CM | POA: Diagnosis not present

## 2023-04-30 DIAGNOSIS — H353212 Exudative age-related macular degeneration, right eye, with inactive choroidal neovascularization: Secondary | ICD-10-CM | POA: Diagnosis not present

## 2023-04-30 DIAGNOSIS — H353122 Nonexudative age-related macular degeneration, left eye, intermediate dry stage: Secondary | ICD-10-CM | POA: Diagnosis not present

## 2023-05-02 ENCOUNTER — Ambulatory Visit: Payer: Medicare HMO | Admitting: Podiatry

## 2023-05-06 ENCOUNTER — Ambulatory Visit (INDEPENDENT_AMBULATORY_CARE_PROVIDER_SITE_OTHER): Payer: Medicare HMO

## 2023-05-06 VITALS — Ht 69.0 in | Wt 203.0 lb

## 2023-05-06 DIAGNOSIS — Z0000 Encounter for general adult medical examination without abnormal findings: Secondary | ICD-10-CM

## 2023-05-06 NOTE — Patient Instructions (Addendum)
[name] , Thank you for taking time to come for your Medicare Wellness Visit. I appreciate your ongoing commitment to your health goals. Please review the following plan we discussed and let me know if I can assist you in the future.   These are the goals we discussed:  Goals      I want to taake care of my health my HTN and DM II     Patient Goals/Self Care Activities: -Patient/Caregiver will self-administer medications as prescribed as evidenced by self-report/primary caregiver report  -Patient/Caregiver will attend all scheduled provider appointments as evidenced by clinician review of documented attendance to scheduled appointments and patient/caregiver report -Patient/Caregiver will call pharmacy for medication refills as evidenced by patient report and review of pharmacy fill history as appropriate -Patient/Caregiver will call provider office for new concerns or questions as evidenced by review of documented incoming telephone call notes and patient report -Patient/Caregiver verbalizes understanding of plan -Patient/Caregiver will focus on medication adherence by taking medications as prescribed  -Calls provider office for new concerns, questions, or BP outside discussed parameters --Follows a low sodium diet/DASH diet -check blood sugar at prescribed times -record values and write them down take them to all doctor visits   -continue to have the nurse to record your BP for you       Remain active        This is a list of the screening recommended for you and due dates:  Health Maintenance  Topic Date Due   Zoster (Shingles) Vaccine (1 of 2) Never done   Yearly kidney health urinalysis for diabetes  07/04/2021   COVID-19 Vaccine (5 - 2023-24 season) 07/13/2022   Hemoglobin A1C  02/12/2023   Flu Shot  06/13/2023   Complete foot exam   06/15/2023   Eye exam for diabetics  10/04/2023   Yearly kidney function blood test for diabetes  02/20/2024   Medicare Annual Wellness  Visit  05/05/2024   DTaP/Tdap/Td vaccine (2 - Td or Tdap) 10/15/2027   Pneumonia Vaccine  Completed   DEXA scan (bone density measurement)  Completed   HPV Vaccine  Aged Out    Advanced directives: We have a copy of your advanced directives available in your record should your provider ever need to access them.   Conditions/risks identified: Aim for 30 minutes of exercise or brisk walking, 6-8 glasses of water, and 5 servings of fruits and vegetables each day.  Next appointment: Follow up in one year for your annual wellness visit    Preventive Care 65 Years and Older, Female Preventive care refers to lifestyle choices and visits with your health care provider that can promote health and wellness. What does preventive care include? A yearly physical exam. This is also called an annual well check. Dental exams once or twice a year. Routine eye exams. Ask your health care provider how often you should have your eyes checked. Personal lifestyle choices, including: Daily care of your teeth and gums. Regular physical activity. Eating a healthy diet. Avoiding tobacco and drug use. Limiting alcohol use. Practicing safe sex. Taking low-dose aspirin every day. Taking vitamin and mineral supplements as recommended by your health care provider. What happens during an annual well check? The services and screenings done by your health care provider during your annual well check will depend on your age, overall health, lifestyle risk factors, and family history of disease. Counseling  Your health care provider may ask you questions about your: Alcohol use. Tobacco use. Drug use.  Emotional well-being. Home and relationship well-being. Sexual activity. Eating habits. History of falls. Memory and ability to understand (cognition). Work and work Astronomer. Reproductive health. Screening  You may have the following tests or measurements: Height, weight, and BMI. Blood pressure. Lipid  and cholesterol levels. These may be checked every 5 years, or more frequently if you are over 54 years old. Skin check. Lung cancer screening. You may have this screening every year starting at age 54 if you have a 30-pack-year history of smoking and currently smoke or have quit within the past 15 years. Fecal occult blood test (FOBT) of the stool. You may have this test every year starting at age 73. Flexible sigmoidoscopy or colonoscopy. You may have a sigmoidoscopy every 5 years or a colonoscopy every 10 years starting at age 87. Hepatitis C blood test. Hepatitis B blood test. Sexually transmitted disease (STD) testing. Diabetes screening. This is done by checking your blood sugar (glucose) after you have not eaten for a while (fasting). You may have this done every 1-3 years. Bone density scan. This is done to screen for osteoporosis. You may have this done starting at age 46. Mammogram. This may be done every 1-2 years. Talk to your health care provider about how often you should have regular mammograms. Talk with your health care provider about your test results, treatment options, and if necessary, the need for more tests. Vaccines  Your health care provider may recommend certain vaccines, such as: Influenza vaccine. This is recommended every year. Tetanus, diphtheria, and acellular pertussis (Tdap, Td) vaccine. You may need a Td booster every 10 years. Zoster vaccine. You may need this after age 88. Pneumococcal 13-valent conjugate (PCV13) vaccine. One dose is recommended after age 90. Pneumococcal polysaccharide (PPSV23) vaccine. One dose is recommended after age 26. Talk to your health care provider about which screenings and vaccines you need and how often you need them. This information is not intended to replace advice given to you by your health care provider. Make sure you discuss any questions you have with your health care provider. Document Released: 11/25/2015 Document  Revised: 07/18/2016 Document Reviewed: 08/30/2015 Elsevier Interactive Patient Education  2017 [employer].  Fall Prevention in the Home Falls can cause injuries. They can happen to people of all ages. There are many things you can do to make your home safe and to help prevent falls. What can I do on the outside of my home? Regularly fix the edges of walkways and driveways and fix any cracks. Remove anything that might make you trip as you walk through a door, such as a raised step or threshold. Trim any bushes or trees on the path to your home. Use bright outdoor lighting. Clear any walking paths of anything that might make someone trip, such as rocks or tools. Regularly check to see if handrails are loose or broken. Make sure that both sides of any steps have handrails. Any raised decks and porches should have guardrails on the edges. Have any leaves, snow, or ice cleared regularly. Use sand or salt on walking paths during winter. Clean up any spills in your garage right away. This includes oil or grease spills. What can I do in the bathroom? Use night lights. Install grab bars by the toilet and in the tub and shower. Do not use towel bars as grab bars. Use non-skid mats or decals in the tub or shower. If you need to sit down in the shower, use a plastic, non-slip stool.  Keep the floor dry. Clean up any water that spills on the floor as soon as it happens. Remove soap buildup in the tub or shower regularly. Attach bath mats securely with double-sided non-slip rug tape. Do not have throw rugs and other things on the floor that can make you trip. What can I do in the bedroom? Use night lights. Make sure that you have a light by your bed that is easy to reach. Do not use any sheets or blankets that are too big for your bed. They should not hang down onto the floor. Have a firm chair that has side arms. You can use this for support while you get dressed. Do not have throw rugs and other  things on the floor that can make you trip. What can I do in the kitchen? Clean up any spills right away. Avoid walking on wet floors. Keep items that you use a lot in easy-to-reach places. If you need to reach something above you, use a strong step stool that has a grab bar. Keep electrical cords out of the way. Do not use floor polish or wax that makes floors slippery. If you must use wax, use non-skid floor wax. Do not have throw rugs and other things on the floor that can make you trip. What can I do with my stairs? Do not leave any items on the stairs. Make sure that there are handrails on both sides of the stairs and use them. Fix handrails that are broken or loose. Make sure that handrails are as long as the stairways. Check any carpeting to make sure that it is firmly attached to the stairs. Fix any carpet that is loose or worn. Avoid having throw rugs at the top or bottom of the stairs. If you do have throw rugs, attach them to the floor with carpet tape. Make sure that you have a light switch at the top of the stairs and the bottom of the stairs. If you do not have them, ask someone to add them for you. What else can I do to help prevent falls? Wear shoes that: Do not have high heels. Have rubber bottoms. Are comfortable and fit you well. Are closed at the toe. Do not wear sandals. If you use a stepladder: Make sure that it is fully opened. Do not climb a closed stepladder. Make sure that both sides of the stepladder are locked into place. Ask someone to hold it for you, if possible. Clearly mark and make sure that you can see: Any grab bars or handrails. First and last steps. Where the edge of each step is. Use tools that help you move around (mobility aids) if they are needed. These include: Canes. Walkers. Scooters. Crutches. Turn on the lights when you go into a dark area. Replace any light bulbs as soon as they burn out. Set up your furniture so you have a clear  path. Avoid moving your furniture around. If any of your floors are uneven, fix them. If there are any pets around you, be aware of where they are. Review your medicines with your doctor. Some medicines can make you feel dizzy. This can increase your chance of falling. Ask your doctor what other things that you can do to help prevent falls. This information is not intended to replace advice given to you by your health care provider. Make sure you discuss any questions you have with your health care provider. Document Released: 08/25/2009 Document Revised: 04/05/2016 Document Reviewed: 12/03/2014  Chart[employer] certified [employer] Patient Education  [employer].

## 2023-05-06 NOTE — Progress Notes (Cosign Needed Addendum)
Subjective:   [name] is a 82 y.o. female who presents for an Initial Medicare Annual Wellness Visit.  Visit Complete: Virtual  I connected with  [name] on 05/06/23 by a audio enabled telemedicine application and verified that I am speaking with the correct person using two identifiers.  Patient Location: Home  Provider Location: Home Office  I discussed the limitations of evaluation and management by telemedicine. The patient expressed understanding and agreed to proceed.  Review of Systems     Cardiac Risk Factors include: advanced age (>38men, >38 women);diabetes mellitus;dyslipidemia;hypertension;sedentary lifestyle     Objective:    Today's Vitals   05/06/23 0924  Weight: 203 lb (92.1 kg)  Height: 5\' 9"  (1.753 m)   Body mass index is 29.98 kg/m.     05/06/2023    1:56 PM 08/13/2022    3:37 PM 07/19/2022    1:52 PM 06/27/2022    1:01 PM 05/28/2022    9:21 AM 02/27/2022    9:14 AM 12/15/2021    8:36 AM  Advanced Directives  Does Patient Have a Medical Advance Directive? Yes No No Yes Yes No No  Type of Estate agent of [place];Living will   Living will;Healthcare Power of [employer] Power of [place];Living will    Does patient want to make changes to medical advance directive? No - Patient declined        Copy of Healthcare Power of Attorney in Chart? Yes - validated most recent copy scanned in chart (See row information)    Yes - validated most recent copy scanned in chart (See row information)    Would patient like information on creating a medical advance directive?  No - Patient declined    No - Patient declined No - Patient declined    Current Medications (verified) Outpatient Encounter Medications as of 05/06/2023  Medication Sig   acetaminophen (TYLENOL 8 HOUR) 650 MG CR tablet Take 1 tablet (650 mg total) by mouth every 8 (eight) hours as needed for pain.   apixaban (ELIQUIS) 2.5 MG TABS tablet Take 1 tablet (2.5 mg  total) by mouth 2 (two) times daily.   atorvastatin (LIPITOR) 40 MG tablet TAKE 1 TABLET DAILY   Blood Glucose Monitoring Suppl (ONE TOUCH ULTRA 2) w/Device KIT USE TO CHECK BLOOD SUGAR UPTO 3 TIMES A DAY   carvedilol (COREG) 6.25 MG tablet TAKE 1 TABLET TWICE A DAY  WITH MEALS   Cholecalciferol 25 MCG (1000 UT) tablet Take 1,000 Units by mouth daily.   docusate sodium (COLACE) 100 MG capsule Take 100 mg by mouth at bedtime.   DULoxetine (CYMBALTA) 20 MG capsule TAKE 1 CAPSULE DAILY.   ferrous sulfate 325 (65 FE) MG tablet TAKE 1 TABLET BY MOUTH EVERY DAY WITH BREAKFAST   glucose blood (ONE TOUCH ULTRA TEST) test strip Use three times a day   glucose blood test strip Use as instructed to check blood glucose up to 4x daily   JARDIANCE 10 MG TABS tablet TAKE 1 TABLET DAILY BEFORE BREAKFAST   Lancets (ONETOUCH DELICA PLUS LANCET33G) MISC Please use to check blood sugar up to four times daily.   metolazone (ZAROXOLYN) 2.5 MG tablet Take only as directed by CHF clinic   Multiple Vitamins-Minerals (PRESERVISION AREDS 2+MULTI VIT PO) Take 1 capsule by mouth in the morning and at bedtime.   nitroGLYCERIN (NITROSTAT) 0.4 MG SL tablet PLACE 1 TABLET (0.4 MG TOTAL) UNDER THE TONGUE EVERY 5 (FIVE) MINUTES AS NEEDED FOR  CHEST PAIN (UP TO 3 DOSES).   potassium chloride SA (KLOR-CON M) 20 MEQ tablet Take 3 tablets (60 mEq total) by mouth every morning AND 2 tablets (40 mEq total) every evening.   rOPINIRole (REQUIP) 0.5 MG tablet TAKE 2 TABLETS AT BEDTIME   spironolactone (ALDACTONE) 25 MG tablet TAKE 1/2 TABLET(12.5MG      TOTAL) DAILY   torsemide (DEMADEX) 20 MG tablet TAKE 3 TABLETS EVERY       MORNING AND 2 TABLETS EVERYEVENING   TRULICITY 0.75 MG/0.5ML SOPN Inject 0.75 mg into the skin once a week.   vitamin B-12 (CYANOCOBALAMIN) 1000 MCG tablet Take 1 tablet (1,000 mcg total) by mouth daily.   zinc oxide 20 % ointment Apply 1 application topically as needed (buttocks irritation).   No  facility-administered encounter medications on file as of 05/06/2023.    Allergies (verified) Benazepril, Cymbalta [duloxetine hcl], Ozempic (0.25 or 0.5 mg-dose) [semaglutide(0.25 or 0.5mg -dos)], Tape, Angiotensin receptor blockers, Bactrim [sulfamethoxazole-trimethoprim], and Fe-succ-c-thre-b12-des stomach   History: Past Medical History:  Diagnosis Date   Acute cystitis with hematuria 09/01/2020   ANXIETY 01/31/2009   Qualifier: Diagnosis of  By: Cristela Felt, CNA, Christy     Arthritis    "qwhere" (03/30/2016)   Bilateral carpal tunnel syndrome 02/13/2021   CAD (coronary artery disease) 05/2008   a. s/p CABG in 2009.   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 Postoperative in 2009;  S/P transesophageal echocardiography-guided cardioversion, previously on Amiodarone and Coumadin. b. recurrent in April 2013 -> pt elected rate control strategy, initially Coumadin -> had stroke with subtherapeutic INR in 03/2016 and transitioned to Eliquis.   Macular degeneration    Melena 01/10/2021   Morbid obesity (HCC) 03/26/2017   OSA on CPAP    severe OSA with AHI 34/hr   Proliferative diabetic retinopathy of right eye (HCC) 04/17/2021   Found today at  delivery of PRP for what was thought to be severe NPDR OD   Rectal bleeding    Thrombocytopenia (HCC)    Past Surgical History:  Procedure Laterality Date   APPENDECTOMY  1946   BACK SURGERY     CARPAL TUNNEL RELEASE Left 03/28/2021   Procedure: LEFT CARPAL TUNNEL RELEASE;  Surgeon: Cindee Salt, MD;  Location: [facility] OR;  Service: Orthopedics;  Laterality: Left;  60 MIN   CARPAL TUNNEL RELEASE Right 09/15/2021   Procedure: RIGHT CARPAL TUNNEL RELEASE;  Surgeon: Cindee Salt, MD;  Location: [facility] OR;  Service: Orthopedics;  Laterality: Right;   CATARACT EXTRACTION Right 2012   COLONOSCOPY WITH PROPOFOL Left 03/27/2017   Procedure: COLONOSCOPY WITH PROPOFOL;  Surgeon: Kerin Salen, MD;  Location: [facility] ENDOSCOPY;  Service: Gastroenterology;  Laterality: Left;   CORONARY ANGIOPLASTY WITH STENT  PLACEMENT  2009   "put 2 stents in"   EXCISION METACARPAL MASS Left 03/28/2021   Procedure: EXCISION MASS LEFT THUMB;  Surgeon: Cindee Salt, MD;  Location: [facility] OR;  Service: Orthopedics;  Laterality: Left;   EYE SURGERY     bilateral cataract   HERNIA REPAIR     IR LUMBAR DISC ASPIRATION W/IMG GUIDE  10/08/2018   JOINT REPLACEMENT     TEE WITH CARDIOVERSION     Postoperative in 2009;  S/P transesophageal echocardiography-guided cardioversion,   TOTAL KNEE ARTHROPLASTY Right 1994   VENTRAL HERNIA REPAIR  10/1999   With mesh   Family History  Problem Relation Age of Onset   Clotting disorder Mother        Cerebral hemorrhage   Other Mother        cerebral hemorrhage   Emphysema Father        COD   Lung cancer Brother    Heart disease Neg Hx    Social History   Socioeconomic History   Marital status: Widowed    Spouse name: Not on file   Number of children: 2   Years of education: 66   Highest education level: Not on file  Occupational History    Comment: retired  Tobacco Use   Smoking status: Never   Smokeless tobacco: Never  Vaping Use   Vaping Use: Never used  Substance and Sexual Activity    Alcohol use: No   Drug use: Never   Sexual activity: Not Currently  Other Topics Concern   Not on file  Social History Narrative   Widowed   01/27/21 Lives with son,  in [place]   2 children   Not routinely exercising   Social Determinants of Health   Financial Resource Strain: Low Risk  (05/06/2023)   Overall Financial Resource Strain (CARDIA)    Difficulty of Paying Living Expenses: Not hard at all  Food Insecurity: No Food Insecurity (05/06/2023)   Hunger Vital Sign    Worried About Running Out of Food in the Last Year: Never true    Ran Out of Food in the Last Year: Never true  Transportation Needs: No Transportation Needs (05/06/2023)   PRAPARE - Administrator, Civil Service (Medical): No    Lack of Transportation (Non-Medical): No  Physical Activity: Inactive (05/06/2023)   Exercise Vital Sign    Days of Exercise per Week: 0 days    Minutes of Exercise per Session: 0 min  Stress: No Stress Concern Present (05/06/2023)   [employer] of Occupational Health - Occupational Stress Questionnaire    Feeling of Stress : Not at all  Social Connections: Moderately Isolated (05/06/2023)   Social Connection and Isolation Panel [NHANES]    Frequency of Communication with Friends and Family: More than three times a week    Frequency of Social Gatherings with Friends and Family: Three times a week    Attends Religious Services: 1 to 4 times per year    Active Member of Clubs or Organizations: No    Attends Banker Meetings: Never    Marital Status: Widowed    Tobacco Counseling Counseling given: Not Answered   Clinical Intake:  Pre-visit preparation completed: Yes  Pain : No/denies pain     Diabetes: Yes CBG done?: No Did pt. bring in CBG monitor from home?: No  How often do you need to have someone help you when you read instructions, pamphlets, or other written materials from your doctor  or pharmacy?: 1 - Never  Interpreter Needed?:  No  Information entered by :: Kandis Fantasia LPN   Activities of Daily Living    05/06/2023    1:55 PM 06/27/2022    1:03 PM  In your present state of health, do you have any difficulty performing the following activities:  Hearing? 0   Vision? 0   Difficulty concentrating or making decisions? 0   Walking or climbing stairs? 1   Dressing or bathing? 1   Doing errands, shopping? 1 0  Preparing Food and eating ? N   Using the Toilet? N   In the past six months, have you accidently leaked urine? Y   Do you have problems with loss of bowel control? N   Managing your Medications? N   Managing your Finances? N   Housekeeping or managing your Housekeeping? Y     Patient Care Team: Maury Dus, MD as PCP - General (Family Medicine) Quintella Reichert, MD as PCP - Cardiology (Cardiology) Laurey Morale, MD as PCP - Advanced Heart Failure (Cardiology) Luciana Axe [name], MD as Consulting Physician (Retina Ophthalmology) Juanell Fairly, RN as [facility] Freddie Breech, DPM as Consulting Physician (Podiatry) Teryl Lucy, MD as Consulting Physician (Orthopedic Surgery)  Indicate any recent Medical Services you may have received from other than [facility] providers in the past year (date may be approximate).     Assessment:   This is a routine wellness examination for Ariabella.  Hearing/Vision screen Hearing Screening - Comments:: Denies hearing difficulties   Vision Screening - Comments:: up to date with routine eye exams with [facility]    Dietary issues and exercise activities discussed:     Goals Addressed             This Visit's Progress    Remain active        Depression Screen    05/06/2023    1:52 PM 09/12/2022   11:18 AM 08/13/2022    3:25 PM 07/19/2022    1:52 PM 05/28/2022    9:22 AM 02/27/2022    9:13 AM 12/15/2021    8:37 AM  PHQ 2/9 Scores  PHQ - 2 Score 0 0 0 0 0 0 0  PHQ- 9 Score   0 0  0 0     Fall Risk    05/06/2023    1:53 PM 08/13/2022    3:22 PM 07/19/2022    1:52 PM 05/28/2022    9:22 AM 02/27/2022    9:13 AM  Fall Risk   Falls in the past year? 0 0 0 0 0  Number falls in past yr: 0 0 0  0  Injury with Fall? 0 0   0  Risk for fall due to : History of fall(s);Impaired balance/gait;Impaired mobility      Follow up Falls prevention discussed;Education provided;Falls evaluation completed  Falls evaluation completed      MEDICARE RISK AT HOME:  Medicare Risk at Home - 05/06/23 1354     Any stairs in or around the home? No    If so, are there any without handrails? No    Home free of loose throw rugs in walkways, pet beds, electrical cords, etc? Yes    Adequate lighting in your home to reduce risk of falls? Yes    Life alert? No    Use of a cane, walker or w/c? Yes    Grab bars in the  bathroom? Yes    Shower chair or bench in shower? Yes    Elevated toilet seat or a handicapped toilet? Yes             TIMED UP AND GO:  Was the test performed? No    Cognitive Function:        05/06/2023    1:56 PM  6CIT Screen  What Year? 0 points  What month? 0 points  What time? 0 points  Count back from 20 0 points  Months in reverse 0 points  Repeat phrase 0 points  Total Score 0 points    Immunizations Immunization History  Administered Date(s) Administered   Fluad Quad(high Dose 65+) 07/27/2019, 08/24/2020, 09/01/2021, 08/13/2022   Influenza,inj,Quad PF,6+ Mos 07/18/2017, 08/05/2018   Moderna Sars-Covid-2 Vaccination 12/02/2019, 12/28/2019, 08/12/2020   PNEUMOCOCCAL CONJUGATE-20 08/13/2022   Pfizer Covid-19 Vaccine Bivalent Booster 53yrs & up 09/01/2021   Pneumococcal Conjugate-13 07/18/2017   Pneumococcal-Unspecified 07/13/2014   Tdap 10/14/2017    TDAP status: Up to date  Pneumococcal vaccine status: Up to date  Covid-19 vaccine status: Information provided on how to obtain vaccines.   Qualifies for Shingles Vaccine? Yes   Zostavax completed No    Shingrix Completed?: No.    Education has been provided regarding the importance of this vaccine. Patient has been advised to call insurance company to determine out of pocket expense if they have not yet received this vaccine. Advised may also receive vaccine at local pharmacy or Health Dept. Verbalized acceptance and understanding.  Screening Tests Health Maintenance  Topic Date Due   Zoster Vaccines- Shingrix (1 of 2) Never done   Diabetic kidney evaluation - Urine ACR  07/04/2021   COVID-19 Vaccine (5 - 2023-24 season) 07/13/2022   HEMOGLOBIN A1C  02/12/2023   INFLUENZA VACCINE  06/13/2023   FOOT EXAM  06/15/2023   OPHTHALMOLOGY EXAM  10/04/2023   Diabetic kidney evaluation - eGFR measurement  02/20/2024   Medicare Annual Wellness (AWV)  05/05/2024   DTaP/Tdap/Td (2 - Td or Tdap) 10/15/2027   Pneumonia Vaccine 61+ Years old  Completed   DEXA SCAN  Completed   HPV VACCINES  Aged Out    Health Maintenance  Health Maintenance Due  Topic Date Due   Zoster Vaccines- Shingrix (1 of 2) Never done   Diabetic kidney evaluation - Urine ACR  07/04/2021   COVID-19 Vaccine (5 - 2023-24 season) 07/13/2022   HEMOGLOBIN A1C  02/12/2023    Colorectal cancer screening: No longer required.   Mammogram status: No longer required due to age and preference.  Bone Density status: Completed 08/22/18. Results reflect: Bone density results: NORMAL. Repeat every 5 years.  Lung Cancer Screening: (Low Dose CT Chest recommended if Age 35-80 years, 20 pack-year currently smoking OR have quit w/in 15years.) does not qualify.   Lung Cancer Screening Referral: n/a  Additional Screening:  Hepatitis C Screening: does not qualify;  Vision Screening: Recommended annual ophthalmology exams for early detection of glaucoma and other disorders of the eye. Is the patient up to date with their annual eye exam?  Yes  Who is the provider or what is the name of the office in which the patient attends annual  eye exams? [facility] If pt is not established with a provider, would they like to be referred to a provider to establish care? No .   Dental Screening: Recommended annual dental exams for proper oral hygiene  Diabetic Foot Exam: Diabetic Foot Exam: Completed 06/14/22  [employer]  Referral / Chronic Care Management: CRR required this visit?  No   CCM required this visit?  No     Plan:     I have personally reviewed and noted the following in the patient's chart:   Medical and social history Use of alcohol, tobacco or illicit drugs  Current medications and supplements including opioid prescriptions. Patient is not currently taking opioid prescriptions. Functional ability and status Nutritional status Physical activity Advanced directives List of other physicians Hospitalizations, surgeries, and ER visits in previous 12 months Vitals Screenings to include cognitive, depression, and falls Referrals and appointments  In addition, I have reviewed and discussed with patient certain preventive protocols, quality metrics, and best practice recommendations. A written personalized care plan for preventive services as well as general preventive health recommendations were provided to patient.     Kandis Fantasia [place], [place]   1/61/0960   After Visit Summary: (Mail) Due to this being a telephonic visit, the after visit summary with patients personalized plan was offered to patient via mail   Nurse Notes: Patient is having problems with being seen by Dr. Dion Saucier and is now having problems with both shoulders.       I have reviewed this visit and agree with the documentation.  Marshall L Chambliss

## 2023-05-13 ENCOUNTER — Ambulatory Visit: Payer: Medicare HMO | Admitting: Podiatry

## 2023-05-13 DIAGNOSIS — M79675 Pain in left toe(s): Secondary | ICD-10-CM | POA: Diagnosis not present

## 2023-05-13 DIAGNOSIS — M79674 Pain in right toe(s): Secondary | ICD-10-CM

## 2023-05-13 DIAGNOSIS — B351 Tinea unguium: Secondary | ICD-10-CM | POA: Diagnosis not present

## 2023-05-13 DIAGNOSIS — E1151 Type 2 diabetes mellitus with diabetic peripheral angiopathy without gangrene: Secondary | ICD-10-CM

## 2023-05-13 NOTE — Progress Notes (Signed)
  Subjective:  Patient ID: [name], female    DOB: July 17, 1941,  MRN: [id number]  Chief Complaint  Patient presents with   Nail Problem    Routine Foot Care- nail trim     82 y.o. female presents with the above complaint. History confirmed with patient. Patient presenting with pain related to dystrophic thickened elongated nails. Patient is unable to trim own nails related to nail dystrophy and/or mobility issues. Patient does have a history of T2DM.  Objective:  Physical Exam: warm, good capillary refill nail exam onychomycosis of the toenails, onycholysis, and dystrophic nails DP pulses palpable, PT pulses palpable, and protective sensation intact Left Foot:  Pain with palpation of nails due to elongation and dystrophic growth.  Right Foot: Pain with palpation of nails due to elongation and dystrophic growth.   Assessment:   1. Pain due to onychomycosis of toenails of both feet   2. Diabetes mellitus type 2 with peripheral artery disease (HCC)      Plan:  Patient was evaluated and treated and all questions answered.  #Onychomycosis with pain  -Nails palliatively debrided as below. -Educated on self-care  Procedure: Nail Debridement Rationale: Pain Type of Debridement: manual, sharp debridement. Instrumentation: Nail nipper, rotary burr. Number of Nails: 10  Return in about 3 months (around 08/13/2023) for [facility].         Corinna Gab, DPM [facility]

## 2023-05-18 ENCOUNTER — Other Ambulatory Visit (HOSPITAL_COMMUNITY): Payer: Self-pay | Admitting: Cardiology

## 2023-05-21 ENCOUNTER — Ambulatory Visit (INDEPENDENT_AMBULATORY_CARE_PROVIDER_SITE_OTHER): Payer: Medicare HMO | Admitting: Family Medicine

## 2023-05-21 DIAGNOSIS — I10 Essential (primary) hypertension: Secondary | ICD-10-CM

## 2023-05-21 DIAGNOSIS — E1142 Type 2 diabetes mellitus with diabetic polyneuropathy: Secondary | ICD-10-CM | POA: Diagnosis not present

## 2023-05-21 DIAGNOSIS — I255 Ischemic cardiomyopathy: Secondary | ICD-10-CM | POA: Diagnosis not present

## 2023-05-21 DIAGNOSIS — N184 Chronic kidney disease, stage 4 (severe): Secondary | ICD-10-CM | POA: Diagnosis not present

## 2023-05-21 DIAGNOSIS — M19012 Primary osteoarthritis, left shoulder: Secondary | ICD-10-CM | POA: Diagnosis not present

## 2023-05-21 DIAGNOSIS — I4891 Unspecified atrial fibrillation: Secondary | ICD-10-CM

## 2023-05-21 DIAGNOSIS — I5022 Chronic systolic (congestive) heart failure: Secondary | ICD-10-CM

## 2023-05-21 DIAGNOSIS — M19011 Primary osteoarthritis, right shoulder: Secondary | ICD-10-CM

## 2023-05-21 LAB — POCT GLYCOSYLATED HEMOGLOBIN (HGB A1C): HbA1c, POC (controlled diabetic range): 6.3 % (ref 0.0–7.0)

## 2023-05-21 MED ORDER — ZOSTER VAC RECOMB ADJUVANTED 50 MCG/0.5ML IM SUSR: 0.5000 mL | Freq: Once | INTRAMUSCULAR | 0 refills | Status: AC

## 2023-05-21 NOTE — Patient Instructions (Signed)
It was great to see you today! Here's what we talked about:  Continue taking all of your medications and following up with the cardiologist. You are doing a great job! I will follow up your lab results with you. I will contact the orthopedist and see if they can call you about your shoulder. I have given you a prescription for the shingles vaccine. Be sure to take this to your pharmacy to get this done.  Please let me know if you have any other questions.  Dr. Phineas Real

## 2023-05-21 NOTE — Progress Notes (Unsigned)
    SUBJECTIVE:   CHIEF COMPLAINT / HPI:   T2DM Previous A1c 7.3% in 08/2022. On jardiance 10 mg daily and trulicity 0.75 mg weekly. Mostly sugars in 110s-120s.   CHF, HTN Follows with cardiology. On jardiance, torsemide BID, coreg BID, spiro daily.  CAD with hx CVA and atrial fibrillation On lipitor daily as well as eliquis.  CKD, now stage 4 Recent creatinine stable over last couple checks, GFR ranging 25-31.  Shoulder pain, bilateral Scheduled for orthopedic surgery for rotator cuff repair on her right arm. However, before she could get it done, her A1c became too high. Now her L arm is hurting like the R one. Difficult to raise above her head both due to weakness and pain.  OBJECTIVE:   LMP  (LMP Unknown)   General: Alert and oriented, in NAD Skin: Warm, dry, and intact HEENT: NCAT, EOM grossly normal, midline nasal septum Cardiac: RRR, no m/r/g appreciated Respiratory: CTAB, breathing and speaking comfortably on RA Abdominal: Soft, nontender, nondistended, normoactive bowel sounds Extremities: ROM of bilateral shoulder severely limited by pain, severe crepitus heard and felt on exam Neurological: No gross focal deficit Psychiatric: Appropriate mood and affect   ASSESSMENT/PLAN:   DM type 2 with diabetic peripheral neuropathy (HCC) A1c today 6.3, down from 7.3 nine months ago. Goal <8% given age. Patient amenable to stopping trulicity for now. Continue jardiance given concurrent renal and cardiac protection. Re-evaluate A1c in 3 months to assess glucose  control.  Chronic systolic heart failure (HCC) Stable. Does not appear overloaded on exam today. Overall has been losing weight while trying. Follow up with cardiology later this month.  Hypertension Vitals unfortunately not recorded today. During visit, BP at goal around 110s SBP. Continue current management for now.  CKD (chronic kidney disease) stage 4, GFR 15-29 ml/min (HCC) Now stage 4 though stable and wavering  around GFR 30. On renal protective medications. Will obtain urine microalb/cr ratio today to further characterize.  Osteoarthritis of both shoulders Stable, though severe, on exam. CT of R shoulder in July 2023 with severe OA and complete rotator cuff tear. Reports initial improvement with PT and connection with orthopedics for repair though never obtained a call back given elevated A1c. Given patient preference, and now that A1c is lower, will resend referral to orthopedics for surgical evaluation.  Health maintenance Shingles vaccine prescription given today.  Janeal Holmes, MD [facility]

## 2023-05-22 DIAGNOSIS — M19011 Primary osteoarthritis, right shoulder: Secondary | ICD-10-CM | POA: Insufficient documentation

## 2023-05-22 LAB — MICROALBUMIN / CREATININE URINE RATIO
Creatinine, Urine: 23.3 mg/dL
Microalb/Creat Ratio: 240 mg/g creat — ABNORMAL HIGH (ref 0–29)
Microalbumin, Urine: 56 ug/mL

## 2023-05-22 NOTE — Assessment & Plan Note (Signed)
Now stage 4 though stable and wavering around GFR 30. On renal protective medications. Will obtain urine microalb/cr ratio today to further characterize.

## 2023-05-22 NOTE — Assessment & Plan Note (Signed)
Stable. Does not appear overloaded on exam today. Overall has been losing weight while trying. Follow up with cardiology later this month.

## 2023-05-22 NOTE — Assessment & Plan Note (Signed)
Vitals unfortunately not recorded today. During visit, BP at goal around 110s SBP. Continue current management for now.

## 2023-05-22 NOTE — Assessment & Plan Note (Addendum)
A1c today 6.3, down from 7.3 nine months ago. Goal <8% given age. Patient amenable to stopping trulicity for now. Continue jardiance given concurrent renal and cardiac protection. Re-evaluate A1c in 3 months to assess glucose  control.

## 2023-05-22 NOTE — Assessment & Plan Note (Addendum)
Stable, though severe, on exam. CT of R shoulder in July 2023 with severe OA and complete rotator cuff tear. Reports initial improvement with PT and connection with orthopedics for repair though never obtained a call back given elevated A1c. Given patient preference, and now that A1c is lower, will resend referral to orthopedics for surgical evaluation.

## 2023-05-23 ENCOUNTER — Telehealth: Payer: Self-pay | Admitting: Family Medicine

## 2023-05-23 NOTE — Telephone Encounter (Signed)
Discussed A1c results and microalbumin/creatinine results with patient. Advised to discontinue Trulicity given A1c control well below goal of 8. Patient amenable. Advised to follow up with me after she hears back from the orthopedist about potential surgery; I would like to see her back shortly after surgery is completed, if done. If not, would prefer follow up in 6 months to a year given good control and connection with specialists (or sooner if needed).

## 2023-06-03 ENCOUNTER — Telehealth: Payer: Self-pay | Admitting: Family Medicine

## 2023-06-03 NOTE — Telephone Encounter (Signed)
Patient is calling and would like for Dr. Phineas Real to call her back. She would like to discuss options for a new orthopedic. She said both of her arms are in pain now.   Please call patient back at [phone].

## 2023-06-04 NOTE — Telephone Encounter (Signed)
Spoke to patient regarding orthopedics referral. Stated that she will get a call from Delbert Harness to schedule an appointment.  Relayed updated MyChart code to activate her account with her son.

## 2023-06-11 ENCOUNTER — Telehealth: Payer: Self-pay

## 2023-06-11 ENCOUNTER — Ambulatory Visit (HOSPITAL_COMMUNITY)
Admission: RE | Admit: 2023-06-11 | Discharge: 2023-06-11 | Disposition: A | Discharge status: Home / Self Care | Payer: Medicare HMO | Source: Ambulatory Visit | Attending: Cardiology | Admitting: Cardiology

## 2023-06-11 VITALS — BP 140/82 | HR 64 | Wt 205.8 lb

## 2023-06-11 DIAGNOSIS — I2510 Atherosclerotic heart disease of native coronary artery without angina pectoris: Secondary | ICD-10-CM | POA: Insufficient documentation

## 2023-06-11 DIAGNOSIS — Z7985 Long-term (current) use of injectable non-insulin antidiabetic drugs: Secondary | ICD-10-CM | POA: Insufficient documentation

## 2023-06-11 DIAGNOSIS — I255 Ischemic cardiomyopathy: Secondary | ICD-10-CM | POA: Insufficient documentation

## 2023-06-11 DIAGNOSIS — I5022 Chronic systolic (congestive) heart failure: Secondary | ICD-10-CM | POA: Insufficient documentation

## 2023-06-11 DIAGNOSIS — I130 Hypertensive heart and chronic kidney disease with heart failure and stage 1 through stage 4 chronic kidney disease, or unspecified chronic kidney disease: Secondary | ICD-10-CM | POA: Insufficient documentation

## 2023-06-11 DIAGNOSIS — Z6830 Body mass index (BMI) 30.0-30.9, adult: Secondary | ICD-10-CM | POA: Insufficient documentation

## 2023-06-11 DIAGNOSIS — Z7984 Long term (current) use of oral hypoglycemic drugs: Secondary | ICD-10-CM | POA: Insufficient documentation

## 2023-06-11 DIAGNOSIS — G4733 Obstructive sleep apnea (adult) (pediatric): Secondary | ICD-10-CM | POA: Insufficient documentation

## 2023-06-11 DIAGNOSIS — N183 Chronic kidney disease, stage 3 unspecified: Secondary | ICD-10-CM | POA: Diagnosis not present

## 2023-06-11 DIAGNOSIS — Z8673 Personal history of transient ischemic attack (TIA), and cerebral infarction without residual deficits: Secondary | ICD-10-CM | POA: Diagnosis not present

## 2023-06-11 DIAGNOSIS — Z951 Presence of aortocoronary bypass graft: Secondary | ICD-10-CM | POA: Insufficient documentation

## 2023-06-11 DIAGNOSIS — I4819 Other persistent atrial fibrillation: Secondary | ICD-10-CM | POA: Diagnosis not present

## 2023-06-11 DIAGNOSIS — E1142 Type 2 diabetes mellitus with diabetic polyneuropathy: Secondary | ICD-10-CM

## 2023-06-11 DIAGNOSIS — E1122 Type 2 diabetes mellitus with diabetic chronic kidney disease: Secondary | ICD-10-CM | POA: Insufficient documentation

## 2023-06-11 DIAGNOSIS — Z7901 Long term (current) use of anticoagulants: Secondary | ICD-10-CM | POA: Insufficient documentation

## 2023-06-11 DIAGNOSIS — Z79899 Other long term (current) drug therapy: Secondary | ICD-10-CM | POA: Insufficient documentation

## 2023-06-11 LAB — BASIC METABOLIC PANEL
Anion gap: 9 (ref 5–15)
BUN: 68 mg/dL — ABNORMAL HIGH (ref 8–23)
CO2: 29 mmol/L (ref 22–32)
Calcium: 10.1 mg/dL (ref 8.9–10.3)
Chloride: 97 mmol/L — ABNORMAL LOW (ref 98–111)
Creatinine, Ser: 1.88 mg/dL — ABNORMAL HIGH (ref 0.44–1.00)
GFR, Estimated: 26 mL/min — ABNORMAL LOW (ref >60)
Glucose, Bld: 141 mg/dL — ABNORMAL HIGH (ref 70–99)
Potassium: 4 mmol/L (ref 3.5–5.1)
Sodium: 135 mmol/L (ref 135–145)

## 2023-06-11 LAB — BRAIN NATRIURETIC PEPTIDE: B Natriuretic Peptide: 76.8 pg/mL (ref 0.0–100.0)

## 2023-06-11 NOTE — Telephone Encounter (Signed)
Patient calls nurse in regards to Trulicity.   She reports her fasting blood sugars have been ~168 since stopping Trulicity. She reports stopping medication ~ 1 month ago.   She denies any change in diet or steroid medications.   She reports she would like to go back on Trulicity to bring her sugar down. She reports she does not want to postpone her upcoming surgery again due to uncontrolled sugars.   Will forward to PCP.

## 2023-06-11 NOTE — Telephone Encounter (Signed)
Patient contacted.   She reports she needs a new prescription sent to her pharmacy.   Will forward back to PCP.

## 2023-06-11 NOTE — Patient Instructions (Addendum)
There has been no changes to your medications.  Labs done today, your results will be available in MyChart, we will contact you for abnormal readings.  Your physician recommends that you schedule a follow-up appointment in: 3 months  If you have any questions or concerns before your next appointment please send us a message through mychart or call our office at [phone].    TO LEAVE A MESSAGE FOR THE NURSE SELECT OPTION 2, PLEASE LEAVE A MESSAGE INCLUDING: YOUR NAME DATE OF BIRTH CALL BACK NUMBER REASON FOR CALL**this is important as we prioritize the call backs  YOU WILL RECEIVE A CALL BACK THE SAME DAY AS LONG AS YOU CALL BEFORE 4:00 PM  At the [facility], you and your health needs are our priority. As part of our continuing mission to provide you with exceptional heart care, we have created designated Provider Care Teams. These Care Teams include your primary Cardiologist (physician) and Advanced Practice Providers (APPs- Physician Assistants and Nurse Practitioners) who all work together to provide you with the care you need, when you need it.   You may see any of the following providers on your designated Care Team at your next follow up: Dr Daniel Bensimhon Dr Dalton McLean Dr. Aditya Sabharwal Amy Clegg, NP Brittainy Simmons, PA Jessica Milford,NP Lindsay Finch, PA Alma Diaz, NP Lauren Kemp, PharmD   Please be sure to bring in all your medications bottles to every appointment.    Thank you for choosing [facility]    

## 2023-06-11 NOTE — Telephone Encounter (Signed)
Patient is okay to resume Trulicity if she desires. She is already at initial starting dose of 0.75 mg weekly. Please inform her that restarting the medication can cause some nausea or bowel upset, but this should go away with time. Can re-evaluate medication use at next A1c check.

## 2023-06-12 ENCOUNTER — Telehealth (HOSPITAL_COMMUNITY): Payer: Self-pay

## 2023-06-12 MED ORDER — TORSEMIDE 20 MG PO TABS: 80.0000 mg | ORAL_TABLET | Freq: Two times a day (BID) | ORAL | 5 refills | Status: DC

## 2023-06-12 MED ORDER — POTASSIUM CHLORIDE CRYS ER 20 MEQ PO TBCR: 60.0000 meq | EXTENDED_RELEASE_TABLET | Freq: Two times a day (BID) | ORAL | 11 refills | Status: DC

## 2023-06-12 MED ORDER — TRULICITY 0.75 MG/0.5ML ~~LOC~~ SOAJ
0.7500 mg | SUBCUTANEOUS | 0 refills | Status: DC
Start: 2023-06-12 — End: 2023-09-18

## 2023-06-12 NOTE — Telephone Encounter (Signed)
Refill for Trulicity sent to patient's pharmacy at request.

## 2023-06-12 NOTE — Progress Notes (Signed)
ID:  [name], DOB December 27, 1940, MRN [id number]   Provider location: [facility] Type of Visit: Established patient   PCP:  Maury Dus, MD  Primary Cardiologist:  Armanda Magic, MD HF Cardiologist: Dr. Shirlee Latch   History of Present Illness: [name] is a 82 y.o. female who has a history of CAD status post CABG 2009, ischemic cardiomyopathy with chronic systolic CHF, persistent atrial fibrillation with stroke in 03/2016 when INR was subtherapeutic, stage III CKD, morbid obesity, pulmonary hypertension, diabetes, GI bleed 03/2017, and severe sleep apnea.    Seen in [facility] clinics 09/10/18 and 09/24/18, both times with volume overload and marked peripheral edema with skin breakdown. Torsemide increased at each visit. Echo in 7/19 showed EF 45-50% with mild RV dilation/mild RV systolic dysfunction and D-shaped interventricular septum.    AKI earlier in 7/20 in setting of excessive torsemide, she had increased dose to 60 qam/40 qpm on her own.  This was cut back to 40 mg daily and losartan and spironolactone were stopped.   Echo in 9/20 showed EF 45% with mildly decreased RV systolic function, PASP 46 mmHg with dilated IVC.  PYP scan in 10/21 was negative.   Echo in 4/22 showed EF 45% with diffuse hypokinesis, mildly decreased RV systolic function, PASP 47, IVC dilated, moderate TR, mild MR.   Echo 3/24 showed EF 50%, mildly decreased RV systolic function, dilated IVC, PASP 47 mmHg.   Today she returns for HF follow up with her son. She is now on torsemide 80 mg bid.  Weight stable.  Her main complaint is stiff and painful shoulders from arthritis.  She walks with a walker.  No dyspnea walking in the house.  No lightheadedness or syncope.  No chest pain.   ECG (personally reviewed): atrial fibrillation, poor RWP  Labs (12/18): K 3.9, Creatinine 1.65 Labs (5/19): K 4.1, creatinine 1.7 Labs (6/19): hgb 8.2 Labs (8/19): K 4.3, creatinine 1.73 Labs (9/19):  K 4, creatinine 1.56 Labs (11/19): K 4.7, creatinine 2.04 Labs (12/19): creatinine 1.44 Labs (7/20): K 5, creatinine 2.49 => 1.86, Hgb 11.1 Labs (8/20): K 3.9, creatinine 1.79 Labs (9/20): LDL 50 Labs (11/20): K 3.8, creatinine 2.01 Labs (2/21): K 4, creatinine 1.9 Labs (7/21): K 4.2, creatinine 1.84, hgb 11.2 Labs (9/21): myeloma panel negative, urine immunofixation negative, LDL 50, K 4.6, creatinine 7.10 Labs (3/22): hgb 13.4, K 4.3, creatinine 1.86 Labs (5/22): K 4.5, creatinine 1.98 Labs (12/22): K 4.6, creatinine 1.71, LDL 47 Labs (8/23): K 4.5, creatinine 1.92, hgb 12.1 Labs (12/23): BNP 85, K 4.5, creatinine 1.64 Labs (3/24): K 4.5, creatinine 1.87, hgb 12.1, LDL 48 Labs (4/24): K 4.4, creatinine 1.94, BNP 68  Review of systems complete and found to be negative unless listed in HPI.    Past Medical History 1. Chronic systolic CHF: Ischemic cardiomyopathy with prominent RV failure.   - Echo (11/18): LVEF 35-40%, severe RV dilation, severe RAE, severe TR.  - Echo (7/19): EF 45-50%, diffuse hypokinesis, mild RV dilation with mildly decreased RV systolic function, D-shaped interventricular septum suggestive of RV pressure/volume overload, mild MR, moderate TR, PASP 49 mmhg.  - Echo (9/20): EF 45%, mild LV dilation, mild RV dilation with mildly decreased systolic function, PASP 46 mmHg, IVC dilated.  - PYP scan (10/21): Negative.  - Echo (4/22): EF 45% with diffuse hypokinesis, mildly decreased RV systolic function, PASP 47, IVC dilated, moderate TR, mild MR. - Echo (3/24): EF 50%, mildly decreased  RV systolic function, dilated IVC, PASP 47 mmHg.  2. CAD: CABG 2009. No angiography since that time.   3. HTN 4. Atrial fibrillation: Chronic since 2013.  5. CKD stage 3 6. Morbid obesity 7. OSA 8. Chronic venous stasis with RLE wound/Bullae 9. CVA 5/18.   Current Outpatient Medications  Medication Sig Dispense Refill   apixaban (ELIQUIS) 2.5 MG TABS tablet Take 1 tablet (2.5 mg  total) by mouth 2 (two) times daily. 180 tablet 3   atorvastatin (LIPITOR) 40 MG tablet TAKE 1 TABLET DAILY 100 tablet 2   Blood Glucose Monitoring Suppl (ONE TOUCH ULTRA 2) w/Device KIT USE TO CHECK BLOOD SUGAR UPTO 3 TIMES A DAY 1 kit 0   carvedilol (COREG) 6.25 MG tablet TAKE 1 TABLET TWICE A DAY  WITH MEALS 180 tablet 3   Cholecalciferol 25 MCG (1000 UT) tablet Take 1,000 Units by mouth daily.     docusate sodium (COLACE) 100 MG capsule Take 100 mg by mouth at bedtime.     DULoxetine (CYMBALTA) 20 MG capsule TAKE 1 CAPSULE DAILY. 90 capsule 3   empagliflozin (JARDIANCE) 10 MG TABS tablet TAKE 1 TABLET DAILY BEFORE BREAKFAST 90 tablet 1   ferrous sulfate 325 (65 FE) MG tablet TAKE 1 TABLET BY MOUTH EVERY DAY WITH BREAKFAST 90 tablet 1   glucose blood (ONE TOUCH ULTRA TEST) test strip Use three times a day 100 each 3   glucose blood test strip Use as instructed to check blood glucose up to 4x daily 100 each 12   Lancets (ONETOUCH DELICA PLUS LANCET33G) MISC Please use to check blood sugar up to four times daily. 100 each 6   metolazone (ZAROXOLYN) 2.5 MG tablet Take only as directed by CHF clinic 10 tablet 0   Multiple Vitamins-Minerals (PRESERVISION AREDS 2+MULTI VIT PO) Take 1 capsule by mouth in the morning and at bedtime.     nitroGLYCERIN (NITROSTAT) 0.4 MG SL tablet PLACE 1 TABLET (0.4 MG TOTAL) UNDER THE TONGUE EVERY 5 (FIVE) MINUTES AS NEEDED FOR CHEST PAIN (UP TO 3 DOSES). 15 tablet 5   rOPINIRole (REQUIP) 0.5 MG tablet TAKE 2 TABLETS AT BEDTIME 180 tablet 3   spironolactone (ALDACTONE) 25 MG tablet TAKE 1/2 TABLET(12.5MG      TOTAL) DAILY 45 tablet 3   vitamin B-12 (CYANOCOBALAMIN) 1000 MCG tablet Take 1 tablet (1,000 mcg total) by mouth daily. 90 tablet 1   zinc oxide 20 % ointment Apply 1 application topically as needed (buttocks irritation). 425 g 1   acetaminophen (TYLENOL 8 HOUR) 650 MG CR tablet Take 1 tablet (650 mg total) by mouth every 8 (eight) hours as needed for pain. 30  tablet 0   Dulaglutide (TRULICITY) 0.75 MG/0.5ML SOPN Inject 0.75 mg into the skin once a week. 6 mL 0   potassium chloride SA (KLOR-CON M) 20 MEQ tablet Take 3 tablets (60 mEq total) by mouth 2 (two) times daily. 180 tablet 11   torsemide (DEMADEX) 20 MG tablet Take 4 tablets (80 mg total) by mouth 2 (two) times daily. 240 tablet 5   No current facility-administered medications for this encounter.   Allergies  Allergen Reactions   Benazepril Other (See Comments)    Unknown reaction at age 30-65 - possibly dizziness   Cymbalta [Duloxetine Hcl]     Itch, Dry mouth    Ozempic (0.25 Or 0.5 Mg-Dose) [Semaglutide(0.25 Or 0.5mg -Dos)] Diarrhea    Gi intollerance   Tape     TOLERATES PAPER TAPE ONLY- due to thin  skin. NOT ALLERGY PER PT   Angiotensin Receptor Blockers Other (See Comments)    Hypotension reaction   Bactrim [Sulfamethoxazole-Trimethoprim] Other (See Comments)    Unknown reaction   Fe-Succ-C-Thre-B12-Des Stomach Other (See Comments)    Unknown reaction  Ferocon?   Social History   Socioeconomic History   Marital status: Widowed    Spouse name: Not on file   Number of children: 2   Years of education: 38   Highest education level: Not on file  Occupational History    Comment: retired  Tobacco Use   Smoking status: Never   Smokeless tobacco: Never  Vaping Use   Vaping status: Never Used  Substance and Sexual Activity   Alcohol use: No   Drug use: Never   Sexual activity: Not Currently  Other Topics Concern   Not on file  Social History Narrative   Widowed   01/27/21 Lives with son,  in [place]   2 children   Not routinely exercising   Social Determinants of Health   Financial Resource Strain: Low Risk  (05/06/2023)   Overall Financial Resource Strain (CARDIA)    Difficulty of Paying Living Expenses: Not hard at all  Food Insecurity: No Food Insecurity (05/06/2023)   Hunger Vital Sign    Worried About Running Out of Food in the Last Year: Never true     Ran Out of Food in the Last Year: Never true  Transportation Needs: No Transportation Needs (05/06/2023)   PRAPARE - Administrator, Civil Service (Medical): No    Lack of Transportation (Non-Medical): No  Physical Activity: Inactive (05/06/2023)   Exercise Vital Sign    Days of Exercise per Week: 0 days    Minutes of Exercise per Session: 0 min  Stress: No Stress Concern Present (05/06/2023)   [employer] of Occupational Health - Occupational Stress Questionnaire    Feeling of Stress : Not at all  Social Connections: Moderately Isolated (05/06/2023)   Social Connection and Isolation Panel [NHANES]    Frequency of Communication with Friends and Family: More than three times a week    Frequency of Social Gatherings with Friends and Family: Three times a week    Attends Religious Services: 1 to 4 times per year    Active Member of Clubs or Organizations: No    Attends Banker Meetings: Never    Marital Status: Widowed  Intimate Partner Violence: Not At Risk (05/06/2023)   Humiliation, Afraid, Rape, and Kick questionnaire    Fear of Current or Ex-Partner: No    Emotionally Abused: No    Physically Abused: No    Sexually Abused: No   Family History  Problem Relation Age of Onset   Clotting disorder Mother        Cerebral hemorrhage   Other Mother        cerebral hemorrhage   Emphysema Father        COD   Lung cancer Brother    Heart disease Neg Hx    BP (!) 140/82   Pulse 64   Wt 93.4 kg (205 lb 12.8 oz)   LMP  (LMP Unknown)   SpO2 100%   BMI 30.39 kg/m   Wt Readings from Last 3 Encounters:  06/11/23 93.4 kg (205 lb 12.8 oz)  05/06/23 92.1 kg (203 lb)  02/20/23 92.4 kg (203 lb 9.6 oz)    PHYSICAL EXAM:  General: NAD Neck: JVP 8 cm, no thyromegaly or thyroid nodule.  Lungs:  Clear to auscultation bilaterally with normal respiratory effort. CV: Nondisplaced PMI.  Heart irregular S1/S2, no S3/S4, no murmur.  Trace ankle edema.  No carotid bruit.   Normal pedal pulses.  Abdomen: Soft, nontender, no hepatosplenomegaly, no distention.  Skin: Intact without lesions or rashes.  Neurologic: Alert and oriented x 3.  Psych: Normal affect. Extremities: No clubbing or cyanosis.  HEENT: Normal.   ASSESSMENT & PLAN: 1. Chronic systolic CHF with prominent RV failure: Ischemic cardiomyopathy.  Echo in 7/19 showed LV EF 45-50% (improved) with mildly dilated/mildly dysfunction RV.  D-shaped septum suggested RV pressure/volume overload.  Echo in 9/20 showed EF 45%, mildly dysfunctional RV, PASP 46 mmHg with dilated IVC.  PYP scan negative and myeloma workup negative, no evidence for cardiac amyloidosis.  Echo in 4/22 showed EF 45% with diffuse hypokinesis, mildly decreased RV systolic function, PASP 47, IVC dilated, moderate TR, mild MR.  Echo 3/24 showed EF 50%, mildly decreased RV systolic function, dilated IVC, PASP 47 mmHg. Chronic NYHA class II-III. She is not significantly volume overloaded today, weight stable.  - Continue Jardiance 10 mg daily.  - Continue torsemide 80 mg bid, BMET and BNP today. - Continue Coreg 6.25 mg bid.   - Continue spironolactone 12.5 mg daily.   - Recommended Cardiomems placement, unfortunately not approved with insurance.   - Continue compression stockings.  2. CAD s/p CABG: No chest pain.  - Continue atorvastatin 40 mg daily. Good lipids in 3/24.  - No ASA with stable CAD on Eliquis.  3. Atrial fibrillation: Chronic. Given long-term atrial fibrillation, she is unlikely to successfully cardiovert.  - Continue Eliquis 2.5 mg bid.  No bleeding issues. 4. CKD stage 3: BMET today.  5. H/o CVA: Continue Eliquis.   6. OSA: She cannot tolerate CPAP.  7. HTN: BP controlled.   - Continue current meds.  Follow up in 3 months with APP.  Signed, Marca Ancona, MD  06/12/2023  [facility] [address] [facility] [place] [address] [phone] (office) [phone]  (fax)

## 2023-06-12 NOTE — Addendum Note (Signed)
Addended by: Evette Georges B on: 06/12/2023 08:27 AM   Modules accepted: Orders

## 2023-06-12 NOTE — Telephone Encounter (Signed)
Spoke with patient through triage line   Patient states that torsemide dose should be 80mg  twice daily  And states that potassium dose should be twice daily   Spoke with MD (mclean) confirmed this. Medications sent to pharmacy.   Advised patient to call back to office with any issues, questions, or concerns. Patient verbalized understanding.

## 2023-06-17 ENCOUNTER — Telehealth: Payer: Self-pay | Admitting: *Deleted

## 2023-06-17 DIAGNOSIS — S46011D Strain of muscle(s) and tendon(s) of the rotator cuff of right shoulder, subsequent encounter: Secondary | ICD-10-CM | POA: Diagnosis not present

## 2023-06-17 DIAGNOSIS — S46012A Strain of muscle(s) and tendon(s) of the rotator cuff of left shoulder, initial encounter: Secondary | ICD-10-CM | POA: Diagnosis not present

## 2023-06-17 NOTE — Telephone Encounter (Signed)
   Pre-operative Risk Assessment    Patient Name: [name]  DOB: 03/13/1941 MRN: [id number]      Request for Surgical Clearance    Procedure:   LEFT REVERSE TOTAL SHOULDER REPLACEMENT  Date of Surgery:  Clearance TBD                                 Surgeon:  Teryl Lucy, MD Surgeon's Group or Practice Name:  [facility] Phone number:  [phone] Fax number:  [phone]   Type of Clearance Requested:   - Medical  - Pharmacy:  Hold Apixaban (Eliquis) NOT INDICATED   Type of Anesthesia:   GENERAL W/ INTERSCALENE BLOCK   Additional requests/questions:    [name]   06/17/2023, 3:54 PM

## 2023-06-18 NOTE — Telephone Encounter (Signed)
OK to hold Eliquis x 3 days for procedure.

## 2023-06-18 NOTE — Telephone Encounter (Signed)
Patient with diagnosis of A Fib on Eliquis for anticoagulation.    Procedure: LEFT REVERSE TOTAL SHOULDER REPLACEMENT  Date of procedure: TBD   CHA2DS2-VASc Score = 9  This indicates a 12.2% annual risk of stroke. The patient's score is based upon: CHF History: 1 HTN History: 1 Diabetes History: 1 Stroke History: 2 Vascular Disease History: 1 Age Score: 2 Gender Score: 1  CrCl 33 mL/min Platelet count 115K  Patient will need to hold Eliquis for 3 days prior to shoulder replacement surgery. Will route to Dr Shirlee Latch if 3 days is acceptable or if bridge is required   **This guidance is not considered finalized until pre-operative APP has relayed final recommendations.**

## 2023-06-18 NOTE — Telephone Encounter (Signed)
Please advise holding Eliquis prior to left reverse total shoulder replacement.   Thank you!  DW

## 2023-06-18 NOTE — Telephone Encounter (Signed)
Dr. Shirlee Latch,   You saw this patient on 06/11/2023. Will you please comment on medical clearance for left reverse total shoulder replacement?  Please route your response to P CV DIV Preop. I will communicate with requesting office once you have given recommendations.   Thank you!  Carlos Levering, NP

## 2023-06-19 ENCOUNTER — Ambulatory Visit: Payer: Self-pay

## 2023-06-19 NOTE — Patient Instructions (Signed)
Visit Information  Thank you for taking time to visit with me today. Please don't hesitate to contact me if I can be of assistance to you.   Following are the goals we discussed today:   Goals Addressed             This Visit's Progress    I want to taake care of my health my HTN and DM II       Patient Goals/Self Care Activities: -Patient/Caregiver will self-administer medications as prescribed as evidenced by self-report/primary caregiver report  -Patient/Caregiver will attend all scheduled provider appointments as evidenced by clinician review of documented attendance to scheduled appointments and patient/caregiver report -Patient/Caregiver will call pharmacy for medication refills as evidenced by patient report and review of pharmacy fill history as appropriate -Patient/Caregiver will call provider office for new concerns or questions as evidenced by review of documented incoming telephone call notes and patient report -Calls provider office for new concerns, questions, or BP outside discussed parameters --Follows a low sodium diet/DASH diet -check blood sugar at prescribed times -record values and write them down  -continue to have the nurse to record your BP for you          Our next appointment is by telephone on 07/23/23 at 130 pm  Please call the care guide team at [phone] if you need to cancel or reschedule your appointment.   If you are experiencing a Mental Health or Behavioral Health Crisis or need someone to talk to, please call 1-800-273-TALK (toll free, 24 hour hotline)  The patient verbalized understanding of instructions, educational materials, and care plan provided today and agreed to receive a mailed copy of patient instructions, educational materials, and care plan.   Juanell Fairly RN, BSN, [facility] Care Coordinator [facility]   Phone: [phone]

## 2023-06-19 NOTE — Patient Outreach (Signed)
  Care Coordination   Follow Up Visit Note   06/19/2023 Name: [name] MRN: [id number] DOB: 09-Dec-1940  [name] is a 82 y.o. year old female who sees [name] Georges, MD for primary care. I spoke with  [name] by phone today.  What matters to the patients health and wellness today?  [name] reported that her blood pressure readings had been elevated, measuring at 140/82 with a pulse of 64 and subsequently at 140/80. She is scheduled to undergo rotator cuff surgery on her left shoulder. Additionally, she disclosed the news of her son's cancer diagnosis and her daughter's upcoming surgery. Understandably, she is deeply concerned for the well-being of both her children.     Goals Addressed             This Visit's Progress    I want to taake care of my health my HTN and DM II       Patient Goals/Self Care Activities: -Patient/Caregiver will self-administer medications as prescribed as evidenced by self-report/primary caregiver report  -Patient/Caregiver will attend all scheduled provider appointments as evidenced by clinician review of documented attendance to scheduled appointments and patient/caregiver report -Patient/Caregiver will call pharmacy for medication refills as evidenced by patient report and review of pharmacy fill history as appropriate -Patient/Caregiver will call provider office for new concerns or questions as evidenced by review of documented incoming telephone call notes and patient report -Calls provider office for new concerns, questions, or BP outside discussed parameters --Follows a low sodium diet/DASH diet -check blood sugar at prescribed times -record values and write them down  -continue to have the nurse to record your BP for you          SDOH assessments and interventions completed:  No         Care Coordination Interventions:  Yes, provided   Interventions Today    Flowsheet Row Most Recent Value  Chronic Disease   Chronic  disease during today's visit Hypertension (HTN)  [Continue to have her nurse help check her blood pressures]  General Interventions   General Interventions Discussed/Reviewed General Interventions Discussed, General Interventions Reviewed  Pharmacy Interventions   Pharmacy Dicussed/Reviewed Pharmacy Topics Discussed  Safety Interventions   Safety Discussed/Reviewed Safety Reviewed        Follow up plan: Follow up call scheduled for 07/23/23  130 pm    Encounter Outcome:  Pt. Visit Completed   Juanell Fairly RN, BSN, [facility] Care Coordinator [facility]   Phone: [phone]

## 2023-06-19 NOTE — Telephone Encounter (Signed)
   Patient Name: [name]  DOB: 1941/08/29 MRN: [id number]  Primary Cardiologist: Armanda Magic, MD HF Cardiologist: Dr. Marca Ancona   Chart reviewed as part of pre-operative protocol coverage. Given past medical history and time since last visit, based on ACC/AHA guidelines, [name] is at acceptable risk for the planned procedure without further cardiovascular testing. Seen last by Dr. Shirlee Latch on 06/11/2023   Per Pharmacy and Dr. Shirlee Latch Patient will need to hold Eliquis for 3 days prior to shoulder replacement surgery.    I will route this recommendation to the requesting party via Epic fax function and remove from pre-op pool.  Please call with questions.  Joni Reining, NP 06/19/2023, 8:41 AM

## 2023-06-21 ENCOUNTER — Telehealth: Payer: Self-pay | Admitting: Family Medicine

## 2023-06-21 NOTE — Telephone Encounter (Cosign Needed Addendum)
Unable to complete form for surgical evaluation for total L shoulder replacement. Patient is high risk as shown below. Would like to see her in person for formal pre-op evaluation. Please schedule this with her.    Recommend holding eliquis as below:

## 2023-06-26 ENCOUNTER — Telehealth: Payer: Self-pay

## 2023-06-26 NOTE — Telephone Encounter (Signed)
Patient calls nurse line regarding issues with contacting [facility] to schedule procedure.   She states that she has attempted to call their office multiple times, however, has not been successful in speaking with anyone.   I called [facility] and left a message with the surgery scheduler. Advised of patient's contact information. Provided with our contact number if they need anything further from our office.   Veronda Prude, RN

## 2023-06-28 NOTE — Telephone Encounter (Signed)
Patient returns call to nurse line regarding previous concern. She reports that [facility] has faxed paperwork to our office for completion.   Paperwork in PCP box.   Veronda Prude, RN

## 2023-07-01 NOTE — Telephone Encounter (Addendum)
Called patient to discuss pre-op evaluation. At her strong request, scheduled in PM ATC tomorrow with Dr. Jena Gauss. Will forward this message to Dr. Elliot Gurney to update on ACS calculations and Eliquis dosing for procedure. Her clearance form will be placed into Dr. Alvina Chou box for completion.

## 2023-07-01 NOTE — Telephone Encounter (Signed)
Called and spoke to patient and she would like for Dr. Phineas Real to call her.  Attempted to schedule her an appointment but there was no availability in the time frame that she was looking for.  Patient states that she can not use her arms and she very much needs the surgery.  Please call patient as soon as possible to discuss this.  Marland KitchenGlennie Hawk, CMA

## 2023-07-02 ENCOUNTER — Ambulatory Visit (INDEPENDENT_AMBULATORY_CARE_PROVIDER_SITE_OTHER): Payer: Medicare HMO | Admitting: Student

## 2023-07-02 VITALS — BP 131/63 | HR 58 | Ht 69.0 in | Wt 212.0 lb

## 2023-07-02 DIAGNOSIS — D696 Thrombocytopenia, unspecified: Secondary | ICD-10-CM | POA: Diagnosis not present

## 2023-07-02 DIAGNOSIS — N184 Chronic kidney disease, stage 4 (severe): Secondary | ICD-10-CM

## 2023-07-02 NOTE — Patient Instructions (Signed)
It was wonderful to meet you today. Thank you for allowing me to be a part of your care. Below is a short summary of what we discussed at your visit today:  I believe you will be a very high risk for this surgery given your age and your comorbidities.  However I will put in labs to chec please come back towards the afternoon for your labs to be drawn.  k your blood count, electrolytes and your kidney function.  If you proceed with your surgery I recommend holding off on your Eliquis at least 2 days prior to your surgery.  Also your Trulicity I recommend holding it off for a week.  Your Coreg and atorvastatin you should take it the day of the surgery.  Your spironolactone you can hold it on the day of the surgery.    Please bring all of your medications to every appointment!  If you have any questions or concerns, please do not hesitate to contact us via phone or MyChart message.   Jerre Simon, MD [facility]

## 2023-07-02 NOTE — Progress Notes (Signed)
    Patient Name: [name] Date of Birth: 1941/01/19 Date of Visit: 07/02/23 PCP: Evette Georges, MD  Chief Complaint: preoperative evaluation Surgeon:  Dr. Dion Saucier  Surgery: L Shoulder arthroplasty Date of Procedure: Undecided   Subjective: [name] is a pleasant 82 y.o. with medical history significant for A-fib, CAD, CHF, OSA, Stroke, pulmonary hypertension and hypertension presenting today for preop clearance.    The patient can walk few feet city blocks unable to walk  2 flights of stairs.  CHA2DS2-VASc Score = 9  This indicates a 12.2% annual risk of stroke. The patient's score is based upon: CHF History: 1 HTN History: 1 Diabetes History: 1 Stroke History: 2 Vascular Disease History: 1 Age Score: 2 Gender Score: 1   Prior surgeries: Appendectomy, hernia repair, cataracts, cardiac catheterization, coronary angioplasty, back surgery.  Review of systems: Chest pain with exertion? No Dyspnea on exertion? No Can you walk 2 blocks without dyspnea or chest pain? No Can you walk up a flight of stairs without chest pain or dyspnea? No Any history of easy bruising or bleeding? Yes Any family history of bleeding diathesis? No Any personal or family of difficulty with anesthesia? No Any history of sleep apnea? Yes  Medical History: Cardiac conditions? Yes  History of VTE? No but history stroke in 2019 History of adverse surgical outcome? No Diabetes? Yes Obesity? Yes OSA? Yes   I have reviewed the patient's medical, surgical, family, and social history as appropriate.   Vitals:   07/02/23 1630  BP: 131/63  Pulse: (!) 58  SpO2: 100%   Filed Weights   07/02/23 1630  Weight: 96.2 kg    Diagnoses and all orders for this visit:  Thrombocytopenia (HCC) -     CBC; Future  CKD (chronic kidney disease) stage 4, GFR 15-29 ml/min (HCC) -     Basic metabolic panel; Future    Patient is high risk for a left shoulder arthroplasty surgery.  Discussed with  patient that I am concerned about her age and extensive comorbidities which puts her at a very high risk for any surgeries at this time.  Patient was able to discuss with her son and at the end of the visit decided they we will opt to pursue more conservative management of the shoulder pain over surgery.  Plan is for patient to call the clinic and schedule a follow-up appointment with her PCP to manage her left shoulder pain.  Jerre Simon, MD  Family Medicine Teaching Service

## 2023-07-04 ENCOUNTER — Other Ambulatory Visit: Payer: Medicare HMO

## 2023-07-04 DIAGNOSIS — D696 Thrombocytopenia, unspecified: Secondary | ICD-10-CM

## 2023-07-04 DIAGNOSIS — N184 Chronic kidney disease, stage 4 (severe): Secondary | ICD-10-CM

## 2023-07-05 LAB — CBC
Hematocrit: 36.3 % (ref 34.0–46.6)
Hemoglobin: 11.8 g/dL (ref 11.1–15.9)
MCH: 31.5 pg (ref 26.6–33.0)
MCHC: 32.5 g/dL (ref 31.5–35.7)
MCV: 97 fL (ref 79–97)
Platelets: 128 10*3/uL — ABNORMAL LOW (ref 150–450)
RBC: 3.75 x10E6/uL — ABNORMAL LOW (ref 3.77–5.28)
RDW: 12.3 % (ref 11.7–15.4)
WBC: 5.1 10*3/uL (ref 3.4–10.8)

## 2023-07-05 LAB — BASIC METABOLIC PANEL
BUN/Creatinine Ratio: 32 — ABNORMAL HIGH (ref 12–28)
BUN: 62 mg/dL — ABNORMAL HIGH (ref 8–27)
CO2: 26 mmol/L (ref 20–29)
Calcium: 9.9 mg/dL (ref 8.7–10.3)
Chloride: 98 mmol/L (ref 96–106)
Creatinine, Ser: 1.93 mg/dL — ABNORMAL HIGH (ref 0.57–1.00)
Glucose: 211 mg/dL — ABNORMAL HIGH (ref 70–99)
Potassium: 4.2 mmol/L (ref 3.5–5.2)
Sodium: 138 mmol/L (ref 134–144)
eGFR: 25 mL/min/{1.73_m2} — ABNORMAL LOW (ref >59)

## 2023-07-22 ENCOUNTER — Ambulatory Visit (INDEPENDENT_AMBULATORY_CARE_PROVIDER_SITE_OTHER): Payer: Medicare HMO | Admitting: Family Medicine

## 2023-07-22 ENCOUNTER — Encounter: Payer: Self-pay | Admitting: Family Medicine

## 2023-07-22 VITALS — BP 129/54 | HR 72 | Ht 69.0 in | Wt 216.1 lb

## 2023-07-22 DIAGNOSIS — Z23 Encounter for immunization: Secondary | ICD-10-CM | POA: Diagnosis not present

## 2023-07-22 DIAGNOSIS — M19012 Primary osteoarthritis, left shoulder: Secondary | ICD-10-CM

## 2023-07-22 DIAGNOSIS — M19011 Primary osteoarthritis, right shoulder: Secondary | ICD-10-CM

## 2023-07-22 NOTE — Assessment & Plan Note (Signed)
Worsening pain.  Given no surgical intervention, we will proceed with more conservative therapy.  Refer to home health PT.  Recommended Voltaren gel to bilateral shoulders as well as heating pads and Tylenol.  Consider low-dose tramadol or oxycodone as needed for refractory pain given inoperable nature.  Follow-up in 1 month.

## 2023-07-22 NOTE — Patient Instructions (Addendum)
It was great to see you today! Here's what we talked about:  I have referred you back to PT. They should call you. Use voltaren gel on your shoulders twice a day. Use heating pads on your shoulder to help relax the muscles. DO NOT put these on with the voltaren gel.  Please let me know if you have any other questions.  Dr. Phineas Real

## 2023-07-22 NOTE — Progress Notes (Signed)
    SUBJECTIVE:   CHIEF COMPLAINT / HPI:   Bilateral shoulder pain Worsening.  Decided against surgical intervention at last visit with Dr. Elliot Gurney given high risk.  Has used PT in the past with help.  Has also tried Voltaren gel with a little help.  Pain mostly whenever she tries to raise her arms up.  It is difficult for her to grip her walker because of the pain.  PERTINENT  PMH / PSH: Hypertension, CAD, pulmonary hypertension, systolic heart failure, atrial fibs, OSA, type 2 diabetes, OA of both shoulders, CKD stage IV, IDA, HLD  OBJECTIVE:   BP (!) 129/54   Pulse 72   Ht 5\' 9"  (1.753 m)   Wt 216 lb 2 oz (98 kg)   LMP  (LMP Unknown)   SpO2 100%   BMI 31.92 kg/m   General: Alert and oriented, in NAD Skin: Warm, dry, and intact without lesions HEENT: NCAT, EOM grossly normal, midline nasal septum Cardiac: RRR, no m/r/g appreciated Respiratory: CTAB anteriorly, breathing and speaking comfortably on RA Abdominal: Nondistended, normoactive bowel sounds Neurological/MSK: No gross focal deficit, severe crepitus and pain of shoulders bilaterally, range of motion limited to 45 degrees elevation Psychiatric: Appropriate mood and affect   ASSESSMENT/PLAN:   Osteoarthritis of both shoulders Worsening pain.  Given no surgical intervention, we will proceed with more conservative therapy.  Refer to home health PT.  Recommended Voltaren gel to bilateral shoulders as well as heating pads and Tylenol.  Consider low-dose tramadol or oxycodone as needed for refractory pain given inoperable nature.  Follow-up in 1 month.   Health maintenance Flu vaccine obtained today.  Janeal Holmes, MD [facility]

## 2023-07-23 ENCOUNTER — Ambulatory Visit: Payer: Self-pay

## 2023-07-23 NOTE — Patient Instructions (Signed)
Visit Information  Thank you for taking time to visit with me today. Please don't hesitate to contact me if I can be of assistance to you.   Following are the goals we discussed today:   Goals Addressed             This Visit's Progress    The patient is having bilateral shoulder pain       Patient Goals/Self Care Activities: -Patient/Caregiver will take medications as prescribed   -Patient/Caregiver will attend all scheduled provider appointments -Patient/Caregiver will call pharmacy for medication refills 3-7 days in advance of running out of medications -Patient/Caregiver will call provider office for new concerns or questions  -Patient/Caregiver will focus on medication adherence by taking medications as prescribed  *patient will verbalize understanding of plan for pain management.  *patient will attend all scheduled medical appointments *patients will use pharmacological and nonpharmacological pain relief strategies like: creams, patches and supplements. Prayer and meditation can decrease pain Listening to music can decrease pain.            Our next appointment is by telephone on 08/22/23 at 10 am  Please call the care guide team at [phone] if you need to cancel or reschedule your appointment.   If you are experiencing a Mental Health or Behavioral Health Crisis or need someone to talk to, please call 1-800-273-TALK (toll free, 24 hour hotline)  The patient verbalized understanding of instructions, educational materials, and care plan provided today.    Juanell Fairly RN, BSN, [facility] Phone: [phone]

## 2023-07-23 NOTE — Patient Outreach (Signed)
  Care Coordination   Follow Up Visit Note   07/23/2023 Name: [name] MRN: [id number] DOB: [birth year]  [name] is a 82 y.o. year old female who sees [name] Georges, MD for primary care. I spoke with  [name] by phone today.  What matters to the patients health and wellness today?   [name] reported experiencing severe pain in both her shoulders, which she rates 8 out of 10. She takes Tylenol in the morning,   in the evening, and sometimes at noon to manage the pain. Her physician advised against surgery and cortisone shots due to potential risks, including a spike in her blood sugar levels up to 400. As part of her treatment, home health has been arranged for physical therapy, and she has been prescribed Voltaren gel.      Goals Addressed             This Visit's Progress    The patient is having bilateral shoulder pain       Patient Goals/Self Care Activities: -Patient/Caregiver will take medications as prescribed   -Patient/Caregiver will attend all scheduled provider appointments -Patient/Caregiver will call pharmacy for medication refills 3-7 days in advance of running out of medications -Patient/Caregiver will call provider office for new concerns or questions  -Patient/Caregiver will focus on medication adherence by taking medications as prescribed  *patient will verbalize understanding of plan for pain management.  *patient will attend all scheduled medical appointments *patients will use pharmacological and nonpharmacological pain relief strategies like: creams, patches and supplements. Prayer and meditation can decrease pain Listening to music can decrease pain.            SDOH assessments and interventions completed:  No     Care Coordination Interventions:  Yes, provided   Interventions Today    Flowsheet Row Most Recent Value  Chronic Disease   Chronic disease during today's visit Diabetes, Hypertension (HTN), Other  [Bilateral shoulder  pain]  General Interventions   General Interventions Discussed/Reviewed General Interventions Discussed, General Interventions Reviewed  [name] of tylenol volteran gel]  Pharmacy Interventions   Pharmacy Dicussed/Reviewed Pharmacy Topics Discussed  Safety Interventions   Safety Discussed/Reviewed Safety Discussed        Follow up plan: Follow up call scheduled for 08/22/23 10 am    Encounter Outcome:  Patient Visit Completed   [name] Fairly RN, BSN, [facility]   Care Coordinator Phone: [phone]

## 2023-07-26 NOTE — Progress Notes (Signed)
Patient is scheduled for 10/1 @330 

## 2023-07-27 ENCOUNTER — Other Ambulatory Visit (HOSPITAL_COMMUNITY): Payer: Self-pay | Admitting: Cardiology

## 2023-08-07 ENCOUNTER — Telehealth: Payer: Self-pay

## 2023-08-07 ENCOUNTER — Other Ambulatory Visit: Payer: Self-pay

## 2023-08-07 MED ORDER — ROPINIROLE HCL 0.5 MG PO TABS: 1.0000 mg | ORAL_TABLET | Freq: Every day | ORAL | 3 refills | Status: DC

## 2023-08-07 MED ORDER — DULOXETINE HCL 20 MG PO CPEP: 20.0000 mg | ORAL_CAPSULE | Freq: Every day | ORAL | 3 refills | Status: DC

## 2023-08-07 NOTE — Telephone Encounter (Signed)
Spoke with patient this morning regarding medication refill request.   While speaking with patient, she asked about update on referral for [facility] PT.   Advised that I would message our referral coordinator for an update.   Veronda Prude, RN

## 2023-08-13 ENCOUNTER — Ambulatory Visit: Payer: Medicare HMO | Admitting: Family Medicine

## 2023-08-13 ENCOUNTER — Encounter: Payer: Self-pay | Admitting: Family Medicine

## 2023-08-13 VITALS — BP 142/68 | HR 63 | Ht 69.0 in | Wt 219.4 lb

## 2023-08-13 DIAGNOSIS — M19011 Primary osteoarthritis, right shoulder: Secondary | ICD-10-CM | POA: Diagnosis not present

## 2023-08-13 DIAGNOSIS — L98429 Non-pressure chronic ulcer of back with unspecified severity: Secondary | ICD-10-CM | POA: Diagnosis not present

## 2023-08-13 DIAGNOSIS — M19012 Primary osteoarthritis, left shoulder: Secondary | ICD-10-CM | POA: Diagnosis not present

## 2023-08-13 MED ORDER — TRAMADOL HCL 50 MG PO TABS: 25.0000 mg | ORAL_TABLET | Freq: Four times a day (QID) | ORAL | 0 refills | Status: DC | PRN

## 2023-08-13 NOTE — Patient Instructions (Signed)
I have sent in tramadol. Take 25 mg every 6 hours as needed for severe pain. Try to take tylenol and use heating pads as well. I will work on getting you home health PT and nursing for your bottom wounds.

## 2023-08-13 NOTE — Assessment & Plan Note (Signed)
>>  ASSESSMENT AND PLAN FOR STAGE 1 SKIN ULCER OF SACRAL REGION (HCC) WRITTEN ON 08/13/2023  5:24 PM BY Saranda Legrande, MD  Recurrent as per her nurse who comes with her insurance.  Will place order for home health wound care to assist her with this.

## 2023-08-13 NOTE — Progress Notes (Signed)
    SUBJECTIVE:   CHIEF COMPLAINT / HPI:   Bilateral shoulder pain follow up Patient has not heard back from any HHPT. She would like this to be done with [facility] at [phone]. She would specifically like Edwyna Ready as her PTA and Kreg Shropshire PT. She would be interested in other options like pain medicine and injections.  Sacral wound Wound has gotten worse per her nurse who comes out to her house from insurance.  Recommends getting wound care to come out to her house.  She has had this before with good results.  OBJECTIVE:   BP (!) 142/68   Pulse 63   Ht 5\' 9"  (1.753 m)   Wt 219 lb 6.4 oz (99.5 kg)   LMP  (LMP Unknown)   SpO2 100%   BMI 32.40 kg/m   General: Alert and oriented, in NAD Skin: Warm, dry, and intact HEENT: NCAT, EOM grossly normal, midline nasal septum Respiratory: Breathing and speaking comfortably on RA Extremities: Audible crepitus of bilateral shoulders with extension higher than 30 degrees Psychiatric: Appropriate mood and affect   ASSESSMENT/PLAN:   Osteoarthritis of both shoulders Worsening.  Will follow-up with referral coordinator to help her get scheduled with [facility] HHPT given she has not heard back from other company that she was referred to.  Discussed starting low-dose tramadol versus shoulder injections given continued pain and inoperable nature.  Elected to start tramadol 25 mg every 6 hours as needed for severe pain.  Discussed risk of falls and decreased mentation while on this medication.  Also discussed continuing Tylenol, heating pads, Voltaren gel.  Follow-up in 1 month to assess physical therapy and medication efficacy or sooner if needed.  Stage 1 skin ulcer of sacral region [facility]) Recurrent as per her nurse who comes with her insurance.  Will place order for home health wound care to assist her with this.   Janeal Holmes, MD [facility]

## 2023-08-13 NOTE — Assessment & Plan Note (Signed)
Recurrent as per her nurse who comes with her insurance.  Will place order for home health wound care to assist her with this.

## 2023-08-13 NOTE — Assessment & Plan Note (Signed)
Worsening.  Will follow-up with referral coordinator to help her get scheduled with [facility] HHPT given she has not heard back from other company that she was referred to.  Discussed starting low-dose tramadol versus shoulder injections given continued pain and inoperable nature.  Elected to start tramadol 25 mg every 6 hours as needed for severe pain.  Discussed risk of falls and decreased mentation while on this medication.  Also discussed continuing Tylenol, heating pads, Voltaren gel.  Follow-up in 1 month to assess physical therapy and medication efficacy or sooner if needed.

## 2023-08-16 ENCOUNTER — Telehealth: Payer: Self-pay

## 2023-08-16 NOTE — Telephone Encounter (Signed)
Patient calls nurse line to follow up with Dr. Phineas Real regarding visit on 08/13/23.  She reports that the tramadol has been helping her with pain levels.   She also reports continued itching while taking duloxetine.   Forwarding update to Dr. Phineas Real.   Veronda Prude, RN

## 2023-08-18 DIAGNOSIS — F32A Depression, unspecified: Secondary | ICD-10-CM | POA: Diagnosis not present

## 2023-08-18 DIAGNOSIS — Z7985 Long-term (current) use of injectable non-insulin antidiabetic drugs: Secondary | ICD-10-CM | POA: Diagnosis not present

## 2023-08-18 DIAGNOSIS — E876 Hypokalemia: Secondary | ICD-10-CM | POA: Diagnosis not present

## 2023-08-18 DIAGNOSIS — M19011 Primary osteoarthritis, right shoulder: Secondary | ICD-10-CM | POA: Diagnosis not present

## 2023-08-18 DIAGNOSIS — Z7984 Long term (current) use of oral hypoglycemic drugs: Secondary | ICD-10-CM | POA: Diagnosis not present

## 2023-08-18 DIAGNOSIS — E7800 Pure hypercholesterolemia, unspecified: Secondary | ICD-10-CM | POA: Diagnosis not present

## 2023-08-18 DIAGNOSIS — Z9181 History of falling: Secondary | ICD-10-CM | POA: Diagnosis not present

## 2023-08-18 DIAGNOSIS — L89151 Pressure ulcer of sacral region, stage 1: Secondary | ICD-10-CM | POA: Diagnosis not present

## 2023-08-18 DIAGNOSIS — E119 Type 2 diabetes mellitus without complications: Secondary | ICD-10-CM | POA: Diagnosis not present

## 2023-08-18 DIAGNOSIS — Z7901 Long term (current) use of anticoagulants: Secondary | ICD-10-CM | POA: Diagnosis not present

## 2023-08-18 DIAGNOSIS — D649 Anemia, unspecified: Secondary | ICD-10-CM | POA: Diagnosis not present

## 2023-08-18 DIAGNOSIS — M19012 Primary osteoarthritis, left shoulder: Secondary | ICD-10-CM | POA: Diagnosis not present

## 2023-08-18 DIAGNOSIS — I519 Heart disease, unspecified: Secondary | ICD-10-CM | POA: Diagnosis not present

## 2023-08-18 NOTE — Telephone Encounter (Signed)
Called patient to discuss improved pain levels with tramadol and concerns with duloxetine.  Given persistent concerns with itchiness from duloxetine, will switch to venlafaxine 37.5 mg daily. This should also provide neuropathic pain benefit, as well. Discussed letting me know should other symptoms arise after starting this. Also discussed ensuring she is using adequate amounts of lotion to ensure skin is hydrated.   Advised we can follow up how she is doing at next appointment or sooner if concerns arise.

## 2023-08-19 ENCOUNTER — Ambulatory Visit: Payer: Medicare HMO | Admitting: Podiatry

## 2023-08-19 ENCOUNTER — Encounter: Payer: Self-pay | Admitting: Podiatry

## 2023-08-19 ENCOUNTER — Telehealth: Payer: Self-pay

## 2023-08-19 DIAGNOSIS — M79675 Pain in left toe(s): Secondary | ICD-10-CM

## 2023-08-19 DIAGNOSIS — M79674 Pain in right toe(s): Secondary | ICD-10-CM

## 2023-08-19 DIAGNOSIS — E1151 Type 2 diabetes mellitus with diabetic peripheral angiopathy without gangrene: Secondary | ICD-10-CM

## 2023-08-19 DIAGNOSIS — B351 Tinea unguium: Secondary | ICD-10-CM | POA: Diagnosis not present

## 2023-08-19 MED ORDER — VENLAFAXINE HCL ER 37.5 MG PO CP24: 37.5000 mg | ORAL_CAPSULE | Freq: Every day | ORAL | 0 refills | Status: DC

## 2023-08-19 NOTE — Addendum Note (Signed)
Addended by: Evette Georges B on: 08/19/2023 02:04 PM   Modules accepted: Orders

## 2023-08-19 NOTE — Progress Notes (Signed)
Subjective:  Patient ID: [name], female    DOB: 1940-11-26,  MRN: [id number]  Chief Complaint  Patient presents with   Routine Foot Care    Diabetes, nail care    82 y.o. female presents with the above complaint. History confirmed with patient. Patient presenting with pain related to dystrophic thickened elongated nails. Patient is unable to trim own nails related to nail dystrophy and/or mobility issues. Patient does have a history of T2DM.  Objective:  Physical Exam: warm, good capillary refill nail exam onychomycosis of the toenails, onycholysis, and dystrophic nails DP pulses palpable, PT pulses palpable, and protective sensation intact Left Foot:  Pain with palpation of nails due to elongation and dystrophic growth.  Right Foot: Pain with palpation of nails due to elongation and dystrophic growth.   Assessment:   1. Pain due to onychomycosis of toenails of both feet   2. Diabetes mellitus type 2 with peripheral artery disease (HCC)       Plan:  Patient was evaluated and treated and all questions answered.  #Onychomycosis with pain  -Nails palliatively debrided as below. -Educated on self-care  Procedure: Nail Debridement Rationale: Pain Type of Debridement: manual, sharp debridement. Instrumentation: Nail nipper, rotary burr. Number of Nails: 10  Return in about 3 months (around 11/19/2023) for [facility].         Corinna Gab, DPM [facility]

## 2023-08-19 NOTE — Telephone Encounter (Signed)
Pam from [facility] calling for nursing verbal orders as follows:  1 time(s) weekly for 9 week(s), then 2 PRN visits.   She is also requesting orders for PT and OT evaluation.   Verbal orders given per [facility] protocol  Veronda Prude, RN

## 2023-08-22 ENCOUNTER — Ambulatory Visit: Payer: Self-pay

## 2023-08-22 NOTE — Patient Outreach (Signed)
Care Coordination   Follow Up Visit Note   08/22/2023 Name: [name] MRN: [id number] DOB: 10-07-1941  [name] is a 82 y.o. year old female who sees Evette Georges, MD for primary care. I spoke with  [name] by phone today.  What matters to the patients health and wellness today?  [name] current condition is stable. She reported the development of a pressure wound on her lower extremity, for which a wound care nurse assisted last Sunday. [name] is awaiting the nurse's return visit. We discussed the importance of offloading, and she confirmed using a cushion for this purpose. Her recent blood pressure reading was 133/50, with a pulse of 54. Her physician has discontinued ropinirole and initiated venlafaxine at a dosage of 37.5 milligrams. [name] continues to experience shoulder pain and has been prescribed tramadol, which she takes one tablet twice daily, supplemented with Tylenol as needed.    Goals Addressed             This Visit's Progress    I want to taake care of my health my HTN and DM II       Patient Goals/Self Care Activities: -Patient/Caregiver will self-administer medications as prescribed as evidenced by self-report/primary caregiver report  -Patient/Caregiver will attend all scheduled provider appointments as evidenced by clinician review of documented attendance to scheduled appointments and patient/caregiver report -Patient/Caregiver will call pharmacy for medication refills as evidenced by patient report and review of pharmacy fill history as appropriate -Patient/Caregiver will call provider office for new concerns or questions as evidenced by review of documented incoming telephone call notes and patient report -Calls provider office for new concerns, questions, or BP outside discussed parameters --Follows a low sodium diet/DASH diet -check blood sugar at prescribed times -record values and write them down  -continue to have the  nurse to record your BP for you and monitor your lower bottom number       The patient is having bilateral shoulder pain       Patient Goals/Self Care Activities: -Patient/Caregiver will take medications as prescribed   -Patient/Caregiver will attend all scheduled provider appointments -Patient/Caregiver will call pharmacy for medication refills 3-7 days in advance of running out of medications -Patient/Caregiver will call provider office for new concerns or questions  -Patient/Caregiver will focus on medication adherence by taking medications as prescribed  *patient will verbalize understanding of plan for pain management.  *patient will attend all scheduled medical appointments *patients will use pharmacological and nonpharmacological pain relief strategies like: creams, patches and supplements. Prayer and meditation can decrease pain Listening to music can decrease pain. -Continue your current regiment           SDOH assessments and interventions completed:  No     Care Coordination Interventions:  Yes, provided   Interventions Today    Flowsheet Row Most Recent Value  Chronic Disease   Chronic disease during today's visit Hypertension (HTN), Other  [Shoulder pain]  General Interventions   General Interventions Discussed/Reviewed General Interventions Discussed  Nutrition Interventions   Nutrition Discussed/Reviewed Nutrition Discussed, Increasing proteins  Pharmacy Interventions   Pharmacy Dicussed/Reviewed Pharmacy Topics Discussed  Safety Interventions   Safety Discussed/Reviewed Safety Discussed        Follow up plan: Follow up call scheduled for 09/24/23  1030 am    Encounter Outcome:  Patient Visit Completed   Juanell Fairly RN, BSN, [facility]   Care Coordinator Phone: [phone]

## 2023-08-22 NOTE — Patient Instructions (Signed)
Visit Information  Thank you for taking time to visit with me today. Please don't hesitate to contact me if I can be of assistance to you.   Following are the goals we discussed today:   Goals Addressed             This Visit's Progress    I want to taake care of my health my HTN and DM II       Patient Goals/Self Care Activities: -Patient/Caregiver will self-administer medications as prescribed as evidenced by self-report/primary caregiver report  -Patient/Caregiver will attend all scheduled provider appointments as evidenced by clinician review of documented attendance to scheduled appointments and patient/caregiver report -Patient/Caregiver will call pharmacy for medication refills as evidenced by patient report and review of pharmacy fill history as appropriate -Patient/Caregiver will call provider office for new concerns or questions as evidenced by review of documented incoming telephone call notes and patient report -Calls provider office for new concerns, questions, or BP outside discussed parameters --Follows a low sodium diet/DASH diet -check blood sugar at prescribed times -record values and write them down  -continue to have the nurse to record your BP for you and monitor your lower bottom number       The patient is having bilateral shoulder pain       Patient Goals/Self Care Activities: -Patient/Caregiver will take medications as prescribed   -Patient/Caregiver will attend all scheduled provider appointments -Patient/Caregiver will call pharmacy for medication refills 3-7 days in advance of running out of medications -Patient/Caregiver will call provider office for new concerns or questions  -Patient/Caregiver will focus on medication adherence by taking medications as prescribed  *patient will verbalize understanding of plan for pain management.  *patient will attend all scheduled medical appointments *patients will use pharmacological and nonpharmacological pain  relief strategies like: creams, patches and supplements. Prayer and meditation can decrease pain Listening to music can decrease pain. -Continue your current regiment           Our next appointment is by telephone on 09/24/23 at 1030 am  Please call the care guide team at [phone] if you need to cancel or reschedule your appointment.   If you are experiencing a Mental Health or Behavioral Health Crisis or need someone to talk to, please call 1-800-273-TALK (toll free, 24 hour hotline)  The patient verbalized understanding of instructions, educational materials, and care plan provided today.    Juanell Fairly RN, BSN, [facility] [employer] Phone: [phone]

## 2023-08-23 ENCOUNTER — Telehealth: Payer: Self-pay

## 2023-08-23 DIAGNOSIS — Z7984 Long term (current) use of oral hypoglycemic drugs: Secondary | ICD-10-CM | POA: Diagnosis not present

## 2023-08-23 DIAGNOSIS — E7800 Pure hypercholesterolemia, unspecified: Secondary | ICD-10-CM | POA: Diagnosis not present

## 2023-08-23 DIAGNOSIS — Z7901 Long term (current) use of anticoagulants: Secondary | ICD-10-CM | POA: Diagnosis not present

## 2023-08-23 DIAGNOSIS — M19011 Primary osteoarthritis, right shoulder: Secondary | ICD-10-CM | POA: Diagnosis not present

## 2023-08-23 DIAGNOSIS — M19012 Primary osteoarthritis, left shoulder: Secondary | ICD-10-CM | POA: Diagnosis not present

## 2023-08-23 DIAGNOSIS — L89151 Pressure ulcer of sacral region, stage 1: Secondary | ICD-10-CM | POA: Diagnosis not present

## 2023-08-23 DIAGNOSIS — E119 Type 2 diabetes mellitus without complications: Secondary | ICD-10-CM | POA: Diagnosis not present

## 2023-08-23 DIAGNOSIS — I519 Heart disease, unspecified: Secondary | ICD-10-CM | POA: Diagnosis not present

## 2023-08-23 DIAGNOSIS — D649 Anemia, unspecified: Secondary | ICD-10-CM | POA: Diagnosis not present

## 2023-08-23 DIAGNOSIS — Z7985 Long-term (current) use of injectable non-insulin antidiabetic drugs: Secondary | ICD-10-CM | POA: Diagnosis not present

## 2023-08-23 DIAGNOSIS — E876 Hypokalemia: Secondary | ICD-10-CM | POA: Diagnosis not present

## 2023-08-23 DIAGNOSIS — F32A Depression, unspecified: Secondary | ICD-10-CM | POA: Diagnosis not present

## 2023-08-23 DIAGNOSIS — Z9181 History of falling: Secondary | ICD-10-CM | POA: Diagnosis not present

## 2023-08-23 NOTE — Telephone Encounter (Signed)
Mark from [facility] for PT verbal orders as follows:  1 time(s) weekly for 1 week(s), then 2 time(s) weekly for 4 week(s), then 1 time per week for 2 weeks.   Verbal orders given per [facility] protocol  Veronda Prude, RN

## 2023-08-27 DIAGNOSIS — Z7985 Long-term (current) use of injectable non-insulin antidiabetic drugs: Secondary | ICD-10-CM | POA: Diagnosis not present

## 2023-08-27 DIAGNOSIS — E119 Type 2 diabetes mellitus without complications: Secondary | ICD-10-CM | POA: Diagnosis not present

## 2023-08-27 DIAGNOSIS — M19011 Primary osteoarthritis, right shoulder: Secondary | ICD-10-CM | POA: Diagnosis not present

## 2023-08-27 DIAGNOSIS — I519 Heart disease, unspecified: Secondary | ICD-10-CM | POA: Diagnosis not present

## 2023-08-27 DIAGNOSIS — E876 Hypokalemia: Secondary | ICD-10-CM | POA: Diagnosis not present

## 2023-08-27 DIAGNOSIS — E7800 Pure hypercholesterolemia, unspecified: Secondary | ICD-10-CM | POA: Diagnosis not present

## 2023-08-27 DIAGNOSIS — F32A Depression, unspecified: Secondary | ICD-10-CM | POA: Diagnosis not present

## 2023-08-27 DIAGNOSIS — Z9181 History of falling: Secondary | ICD-10-CM | POA: Diagnosis not present

## 2023-08-27 DIAGNOSIS — Z7901 Long term (current) use of anticoagulants: Secondary | ICD-10-CM | POA: Diagnosis not present

## 2023-08-27 DIAGNOSIS — D649 Anemia, unspecified: Secondary | ICD-10-CM | POA: Diagnosis not present

## 2023-08-27 DIAGNOSIS — M19012 Primary osteoarthritis, left shoulder: Secondary | ICD-10-CM | POA: Diagnosis not present

## 2023-08-27 DIAGNOSIS — Z7984 Long term (current) use of oral hypoglycemic drugs: Secondary | ICD-10-CM | POA: Diagnosis not present

## 2023-08-27 DIAGNOSIS — L89151 Pressure ulcer of sacral region, stage 1: Secondary | ICD-10-CM | POA: Diagnosis not present

## 2023-08-30 DIAGNOSIS — M19012 Primary osteoarthritis, left shoulder: Secondary | ICD-10-CM | POA: Diagnosis not present

## 2023-08-30 DIAGNOSIS — E119 Type 2 diabetes mellitus without complications: Secondary | ICD-10-CM | POA: Diagnosis not present

## 2023-08-30 DIAGNOSIS — D649 Anemia, unspecified: Secondary | ICD-10-CM | POA: Diagnosis not present

## 2023-08-30 DIAGNOSIS — Z9181 History of falling: Secondary | ICD-10-CM | POA: Diagnosis not present

## 2023-08-30 DIAGNOSIS — M19011 Primary osteoarthritis, right shoulder: Secondary | ICD-10-CM | POA: Diagnosis not present

## 2023-08-30 DIAGNOSIS — L89151 Pressure ulcer of sacral region, stage 1: Secondary | ICD-10-CM | POA: Diagnosis not present

## 2023-08-30 DIAGNOSIS — Z7901 Long term (current) use of anticoagulants: Secondary | ICD-10-CM | POA: Diagnosis not present

## 2023-08-30 DIAGNOSIS — E7800 Pure hypercholesterolemia, unspecified: Secondary | ICD-10-CM | POA: Diagnosis not present

## 2023-08-30 DIAGNOSIS — Z7985 Long-term (current) use of injectable non-insulin antidiabetic drugs: Secondary | ICD-10-CM | POA: Diagnosis not present

## 2023-08-30 DIAGNOSIS — I519 Heart disease, unspecified: Secondary | ICD-10-CM | POA: Diagnosis not present

## 2023-08-30 DIAGNOSIS — F32A Depression, unspecified: Secondary | ICD-10-CM | POA: Diagnosis not present

## 2023-08-30 DIAGNOSIS — Z7984 Long term (current) use of oral hypoglycemic drugs: Secondary | ICD-10-CM | POA: Diagnosis not present

## 2023-08-30 DIAGNOSIS — E876 Hypokalemia: Secondary | ICD-10-CM | POA: Diagnosis not present

## 2023-09-02 DIAGNOSIS — Z7984 Long term (current) use of oral hypoglycemic drugs: Secondary | ICD-10-CM | POA: Diagnosis not present

## 2023-09-02 DIAGNOSIS — E7800 Pure hypercholesterolemia, unspecified: Secondary | ICD-10-CM | POA: Diagnosis not present

## 2023-09-02 DIAGNOSIS — D649 Anemia, unspecified: Secondary | ICD-10-CM | POA: Diagnosis not present

## 2023-09-02 DIAGNOSIS — E876 Hypokalemia: Secondary | ICD-10-CM | POA: Diagnosis not present

## 2023-09-02 DIAGNOSIS — F32A Depression, unspecified: Secondary | ICD-10-CM | POA: Diagnosis not present

## 2023-09-02 DIAGNOSIS — E119 Type 2 diabetes mellitus without complications: Secondary | ICD-10-CM | POA: Diagnosis not present

## 2023-09-02 DIAGNOSIS — M19012 Primary osteoarthritis, left shoulder: Secondary | ICD-10-CM | POA: Diagnosis not present

## 2023-09-02 DIAGNOSIS — Z7901 Long term (current) use of anticoagulants: Secondary | ICD-10-CM | POA: Diagnosis not present

## 2023-09-02 DIAGNOSIS — L89151 Pressure ulcer of sacral region, stage 1: Secondary | ICD-10-CM | POA: Diagnosis not present

## 2023-09-02 DIAGNOSIS — M19011 Primary osteoarthritis, right shoulder: Secondary | ICD-10-CM | POA: Diagnosis not present

## 2023-09-02 DIAGNOSIS — Z9181 History of falling: Secondary | ICD-10-CM | POA: Diagnosis not present

## 2023-09-02 DIAGNOSIS — I519 Heart disease, unspecified: Secondary | ICD-10-CM | POA: Diagnosis not present

## 2023-09-02 DIAGNOSIS — Z7985 Long-term (current) use of injectable non-insulin antidiabetic drugs: Secondary | ICD-10-CM | POA: Diagnosis not present

## 2023-09-03 ENCOUNTER — Telehealth: Payer: Self-pay

## 2023-09-03 DIAGNOSIS — E113492 Type 2 diabetes mellitus with severe nonproliferative diabetic retinopathy without macular edema, left eye: Secondary | ICD-10-CM | POA: Diagnosis not present

## 2023-09-03 DIAGNOSIS — H353113 Nonexudative age-related macular degeneration, right eye, advanced atrophic without subfoveal involvement: Secondary | ICD-10-CM | POA: Diagnosis not present

## 2023-09-03 DIAGNOSIS — H40021 Open angle with borderline findings, high risk, right eye: Secondary | ICD-10-CM | POA: Diagnosis not present

## 2023-09-03 DIAGNOSIS — H43822 Vitreomacular adhesion, left eye: Secondary | ICD-10-CM | POA: Diagnosis not present

## 2023-09-03 DIAGNOSIS — H353114 Nonexudative age-related macular degeneration, right eye, advanced atrophic with subfoveal involvement: Secondary | ICD-10-CM | POA: Diagnosis not present

## 2023-09-03 DIAGNOSIS — E113491 Type 2 diabetes mellitus with severe nonproliferative diabetic retinopathy without macular edema, right eye: Secondary | ICD-10-CM | POA: Diagnosis not present

## 2023-09-03 DIAGNOSIS — H353212 Exudative age-related macular degeneration, right eye, with inactive choroidal neovascularization: Secondary | ICD-10-CM | POA: Diagnosis not present

## 2023-09-03 DIAGNOSIS — H353122 Nonexudative age-related macular degeneration, left eye, intermediate dry stage: Secondary | ICD-10-CM | POA: Diagnosis not present

## 2023-09-03 DIAGNOSIS — H353222 Exudative age-related macular degeneration, left eye, with inactive choroidal neovascularization: Secondary | ICD-10-CM | POA: Diagnosis not present

## 2023-09-03 NOTE — Telephone Encounter (Signed)
Patient calls nurse line requesting refills on Tramadol and Venlafaxine.   She reports she was only given #15 pills and was told to use sparling.   She reports the last week she has only been taking #2 per day. However, she reports very minimal relief. She reports "I am in pain all day."   Advised will send to PCP for medications refills.   She is requesting an increased number of Tramadol if PCP will allow.

## 2023-09-04 DIAGNOSIS — I519 Heart disease, unspecified: Secondary | ICD-10-CM | POA: Diagnosis not present

## 2023-09-04 DIAGNOSIS — Z7901 Long term (current) use of anticoagulants: Secondary | ICD-10-CM | POA: Diagnosis not present

## 2023-09-04 DIAGNOSIS — E876 Hypokalemia: Secondary | ICD-10-CM | POA: Diagnosis not present

## 2023-09-04 DIAGNOSIS — D649 Anemia, unspecified: Secondary | ICD-10-CM | POA: Diagnosis not present

## 2023-09-04 DIAGNOSIS — F32A Depression, unspecified: Secondary | ICD-10-CM | POA: Diagnosis not present

## 2023-09-04 DIAGNOSIS — Z7984 Long term (current) use of oral hypoglycemic drugs: Secondary | ICD-10-CM | POA: Diagnosis not present

## 2023-09-04 DIAGNOSIS — M19012 Primary osteoarthritis, left shoulder: Secondary | ICD-10-CM | POA: Diagnosis not present

## 2023-09-04 DIAGNOSIS — M19011 Primary osteoarthritis, right shoulder: Secondary | ICD-10-CM | POA: Diagnosis not present

## 2023-09-04 DIAGNOSIS — Z9181 History of falling: Secondary | ICD-10-CM | POA: Diagnosis not present

## 2023-09-04 DIAGNOSIS — L89151 Pressure ulcer of sacral region, stage 1: Secondary | ICD-10-CM | POA: Diagnosis not present

## 2023-09-04 DIAGNOSIS — E7800 Pure hypercholesterolemia, unspecified: Secondary | ICD-10-CM | POA: Diagnosis not present

## 2023-09-04 DIAGNOSIS — Z7985 Long-term (current) use of injectable non-insulin antidiabetic drugs: Secondary | ICD-10-CM | POA: Diagnosis not present

## 2023-09-04 DIAGNOSIS — E119 Type 2 diabetes mellitus without complications: Secondary | ICD-10-CM | POA: Diagnosis not present

## 2023-09-04 MED ORDER — VENLAFAXINE HCL ER 37.5 MG PO CP24: 37.5000 mg | ORAL_CAPSULE | Freq: Every day | ORAL | 0 refills | Status: DC

## 2023-09-04 MED ORDER — TRAMADOL HCL 50 MG PO TABS: 50.0000 mg | ORAL_TABLET | Freq: Four times a day (QID) | ORAL | 0 refills | Status: DC | PRN

## 2023-09-04 NOTE — Telephone Encounter (Signed)
Pain remains improved but uncontrolled per telephone note. Will increase dose from 25 mg to 50 mg Q6H prn severe pain. Will send in month supply. As previously discussed, please advise patient to be careful with ambulating (especially at this higher dose) and to let me know should she experience any drowsiness, falls, or other side effects.

## 2023-09-05 NOTE — Telephone Encounter (Signed)
Spoke with patient.   We discussed the new tramadol dosage and frequency.   Patient was appreciative of call.

## 2023-09-09 DIAGNOSIS — D649 Anemia, unspecified: Secondary | ICD-10-CM | POA: Diagnosis not present

## 2023-09-09 DIAGNOSIS — L89151 Pressure ulcer of sacral region, stage 1: Secondary | ICD-10-CM | POA: Diagnosis not present

## 2023-09-09 DIAGNOSIS — Z7901 Long term (current) use of anticoagulants: Secondary | ICD-10-CM | POA: Diagnosis not present

## 2023-09-09 DIAGNOSIS — Z7985 Long-term (current) use of injectable non-insulin antidiabetic drugs: Secondary | ICD-10-CM | POA: Diagnosis not present

## 2023-09-09 DIAGNOSIS — F32A Depression, unspecified: Secondary | ICD-10-CM | POA: Diagnosis not present

## 2023-09-09 DIAGNOSIS — E876 Hypokalemia: Secondary | ICD-10-CM | POA: Diagnosis not present

## 2023-09-09 DIAGNOSIS — E7800 Pure hypercholesterolemia, unspecified: Secondary | ICD-10-CM | POA: Diagnosis not present

## 2023-09-09 DIAGNOSIS — M19012 Primary osteoarthritis, left shoulder: Secondary | ICD-10-CM | POA: Diagnosis not present

## 2023-09-09 DIAGNOSIS — Z9181 History of falling: Secondary | ICD-10-CM | POA: Diagnosis not present

## 2023-09-09 DIAGNOSIS — Z7984 Long term (current) use of oral hypoglycemic drugs: Secondary | ICD-10-CM | POA: Diagnosis not present

## 2023-09-09 DIAGNOSIS — E119 Type 2 diabetes mellitus without complications: Secondary | ICD-10-CM | POA: Diagnosis not present

## 2023-09-09 DIAGNOSIS — M19011 Primary osteoarthritis, right shoulder: Secondary | ICD-10-CM | POA: Diagnosis not present

## 2023-09-09 DIAGNOSIS — I519 Heart disease, unspecified: Secondary | ICD-10-CM | POA: Diagnosis not present

## 2023-09-10 DIAGNOSIS — E113493 Type 2 diabetes mellitus with severe nonproliferative diabetic retinopathy without macular edema, bilateral: Secondary | ICD-10-CM | POA: Diagnosis not present

## 2023-09-10 DIAGNOSIS — H43813 Vitreous degeneration, bilateral: Secondary | ICD-10-CM | POA: Diagnosis not present

## 2023-09-10 DIAGNOSIS — H40023 Open angle with borderline findings, high risk, bilateral: Secondary | ICD-10-CM | POA: Diagnosis not present

## 2023-09-10 DIAGNOSIS — H17821 Peripheral opacity of cornea, right eye: Secondary | ICD-10-CM | POA: Diagnosis not present

## 2023-09-10 DIAGNOSIS — Z961 Presence of intraocular lens: Secondary | ICD-10-CM | POA: Diagnosis not present

## 2023-09-10 DIAGNOSIS — H353131 Nonexudative age-related macular degeneration, bilateral, early dry stage: Secondary | ICD-10-CM | POA: Diagnosis not present

## 2023-09-10 NOTE — Progress Notes (Signed)
ID:  [name], DOB [birth year], MRN [id number]   Provider location: [facility] Type of Visit: Established patient   PCP:  Maury Dus, MD  Primary Cardiologist:  Armanda Magic, MD HF Cardiologist: Dr. Shirlee Latch   History of Present Illness: [name] is a 82 y.o. female who has a history of CAD status post CABG 2009, ischemic cardiomyopathy with chronic systolic CHF, persistent atrial fibrillation with stroke in 03/2016 when INR was subtherapeutic, stage III CKD, morbid obesity, pulmonary hypertension, diabetes, GI bleed 03/2017, and severe sleep apnea.    Seen in [facility] clinics 09/10/18 and 09/24/18, both times with volume overload and marked peripheral edema with skin breakdown. Torsemide increased at each visit. Echo in 7/19 showed EF 45-50% with mild RV dilation/mild RV systolic dysfunction and D-shaped interventricular septum.    AKI earlier in 7/20 in setting of excessive torsemide, she had increased dose to 60 qam/40 qpm on her own.  This was cut back to 40 mg daily and losartan and spironolactone were stopped.   Echo in 9/20 showed EF 45% with mildly decreased RV systolic function, PASP 46 mmHg with dilated IVC.  PYP scan in 10/21 was negative.   Echo in 4/22 showed EF 45% with diffuse hypokinesis, mildly decreased RV systolic function, PASP 47, IVC dilated, moderate TR, mild MR.   Echo 3/24 showed EF 50%, mildly decreased RV systolic function, dilated IVC, PASP 47 mmHg.   Today she returns for HF follow up with her son. She is now on torsemide 80 mg bid.  Weight stable.  Her main complaint is stiff and painful shoulders from arthritis.  She walks with a walker.  No dyspnea walking in the house.  No lightheadedness or syncope.  No chest pain.   ECG (personally reviewed): atrial fibrillation, poor RWP  Labs (12/18): K 3.9, Creatinine 1.65 Labs (5/19): K 4.1, creatinine 1.7 Labs (6/19): hgb 8.2 Labs (8/19): K 4.3, creatinine 1.73 Labs (9/19):  K 4, creatinine 1.56 Labs (11/19): K 4.7, creatinine 2.04 Labs (12/19): creatinine 1.44 Labs (7/20): K 5, creatinine 2.49 => 1.86, Hgb 11.1 Labs (8/20): K 3.9, creatinine 1.79 Labs (9/20): LDL 50 Labs (11/20): K 3.8, creatinine 2.01 Labs (2/21): K 4, creatinine 1.9 Labs (7/21): K 4.2, creatinine 1.84, hgb 11.2 Labs (9/21): myeloma panel negative, urine immunofixation negative, LDL 50, K 4.6, creatinine 2.53 Labs (3/22): hgb 13.4, K 4.3, creatinine 1.86 Labs (5/22): K 4.5, creatinine 1.98 Labs (12/22): K 4.6, creatinine 1.71, LDL 47 Labs (8/23): K 4.5, creatinine 1.92, hgb 12.1 Labs (12/23): BNP 85, K 4.5, creatinine 1.64 Labs (3/24): K 4.5, creatinine 1.87, hgb 12.1, LDL 48 Labs (4/24): K 4.4, creatinine 1.94, BNP 68  Review of systems complete and found to be negative unless listed in HPI.    Past Medical History 1. Chronic systolic CHF: Ischemic cardiomyopathy with prominent RV failure.   - Echo (11/18): LVEF 35-40%, severe RV dilation, severe RAE, severe TR.  - Echo (7/19): EF 45-50%, diffuse hypokinesis, mild RV dilation with mildly decreased RV systolic function, D-shaped interventricular septum suggestive of RV pressure/volume overload, mild MR, moderate TR, PASP 49 mmhg.  - Echo (9/20): EF 45%, mild LV dilation, mild RV dilation with mildly decreased systolic function, PASP 46 mmHg, IVC dilated.  - PYP scan (10/21): Negative.  - Echo (4/22): EF 45% with diffuse hypokinesis, mildly decreased RV systolic function, PASP 47, IVC dilated, moderate TR, mild MR. - Echo (3/24): EF 50%, mildly decreased  RV systolic function, dilated IVC, PASP 47 mmHg.  2. CAD: CABG 2009. No angiography since that time.   3. HTN 4. Atrial fibrillation: Chronic since 2013.  5. CKD stage 3 6. Morbid obesity 7. OSA 8. Chronic venous stasis with RLE wound/Bullae 9. CVA 5/18.   Current Outpatient Medications  Medication Sig Dispense Refill   acetaminophen (TYLENOL 8 HOUR) 650 MG CR tablet Take 1 tablet  (650 mg total) by mouth every 8 (eight) hours as needed for pain. 30 tablet 0   apixaban (ELIQUIS) 2.5 MG TABS tablet Take 1 tablet (2.5 mg total) by mouth 2 (two) times daily. 180 tablet 3   atorvastatin (LIPITOR) 40 MG tablet TAKE 1 TABLET DAILY 100 tablet 2   Blood Glucose Monitoring Suppl (ONE TOUCH ULTRA 2) w/Device KIT USE TO CHECK BLOOD SUGAR UPTO 3 TIMES A DAY 1 kit 0   carvedilol (COREG) 6.25 MG tablet TAKE 1 TABLET TWICE A DAY  WITH MEALS 180 tablet 3   Cholecalciferol 25 MCG (1000 UT) tablet Take 1,000 Units by mouth daily.     docusate sodium (COLACE) 100 MG capsule Take 100 mg by mouth at bedtime.     Dulaglutide (TRULICITY) 0.75 MG/0.5ML SOPN Inject 0.75 mg into the skin once a week. 6 mL 0   empagliflozin (JARDIANCE) 10 MG TABS tablet TAKE 1 TABLET DAILY BEFORE BREAKFAST 90 tablet 1   ferrous sulfate 325 (65 FE) MG tablet TAKE 1 TABLET BY MOUTH EVERY DAY WITH BREAKFAST 90 tablet 1   glucose blood (ONE TOUCH ULTRA TEST) test strip Use three times a day 100 each 3   glucose blood test strip Use as instructed to check blood glucose up to 4x daily 100 each 12   Lancets (ONETOUCH DELICA PLUS LANCET33G) MISC Please use to check blood sugar up to four times daily. 100 each 6   metolazone (ZAROXOLYN) 2.5 MG tablet Take only as directed by CHF clinic 10 tablet 0   Multiple Vitamins-Minerals (PRESERVISION AREDS 2+MULTI VIT PO) Take 1 capsule by mouth in the morning and at bedtime.     nitroGLYCERIN (NITROSTAT) 0.4 MG SL tablet PLACE 1 TABLET (0.4 MG TOTAL) UNDER THE TONGUE EVERY 5 (FIVE) MINUTES AS NEEDED FOR CHEST PAIN (UP TO 3 DOSES). 15 tablet 5   potassium chloride SA (KLOR-CON M) 20 MEQ tablet Take 3 tablets (60 mEq total) by mouth 2 (two) times daily. 180 tablet 11   rOPINIRole (REQUIP) 0.5 MG tablet Take 2 tablets (1 mg total) by mouth at bedtime. 180 tablet 3   spironolactone (ALDACTONE) 25 MG tablet TAKE 1/2 TABLET(12.5MG      TOTAL) DAILY 45 tablet 3   torsemide (DEMADEX) 20 MG  tablet Take 4 tablets (80 mg total) by mouth 2 (two) times daily. 240 tablet 5   traMADol (ULTRAM) 50 MG tablet Take 1 tablet (50 mg total) by mouth every 6 (six) hours as needed for severe pain (pain score 7-10). 30 tablet 0   venlafaxine XR (EFFEXOR XR) 37.5 MG 24 hr capsule Take 1 capsule (37.5 mg total) by mouth daily with breakfast. 30 capsule 0   vitamin B-12 (CYANOCOBALAMIN) 1000 MCG tablet Take 1 tablet (1,000 mcg total) by mouth daily. 90 tablet 1   zinc oxide 20 % ointment Apply 1 application topically as needed (buttocks irritation). 425 g 1   No current facility-administered medications for this visit.   Allergies  Allergen Reactions   Benazepril Other (See Comments)    Unknown reaction at age 23-65 -  possibly dizziness   Cymbalta [Duloxetine Hcl]     Itch, Dry mouth    Ozempic (0.25 Or 0.5 Mg-Dose) [Semaglutide(0.25 Or 0.5mg -Dos)] Diarrhea    Gi intollerance   Tape     TOLERATES PAPER TAPE ONLY- due to thin skin. NOT ALLERGY PER PT   Angiotensin Receptor Blockers Other (See Comments)    Hypotension reaction   Bactrim [Sulfamethoxazole-Trimethoprim] Other (See Comments)    Unknown reaction   Fe-Succ-C-Thre-B12-Des Stomach Other (See Comments)    Unknown reaction  Ferocon?   Social History   Socioeconomic History   Marital status: Widowed    Spouse name: Not on file   Number of children: 2   Years of education: 2   Highest education level: Not on file  Occupational History    Comment: retired  Tobacco Use   Smoking status: Never   Smokeless tobacco: Never  Vaping Use   Vaping status: Never Used  Substance and Sexual Activity   Alcohol use: No   Drug use: Never   Sexual activity: Not Currently  Other Topics Concern   Not on file  Social History Narrative   Widowed   01/27/21 Lives with son,  in [place]   2 children   Not routinely exercising   Social Determinants of Health   Financial Resource Strain: Low Risk  (05/06/2023)   Overall Financial  Resource Strain (CARDIA)    Difficulty of Paying Living Expenses: Not hard at all  Food Insecurity: No Food Insecurity (05/06/2023)   Hunger Vital Sign    Worried About Running Out of Food in the Last Year: Never true    Ran Out of Food in the Last Year: Never true  Transportation Needs: No Transportation Needs (05/06/2023)   PRAPARE - Administrator, Civil Service (Medical): No    Lack of Transportation (Non-Medical): No  Physical Activity: Inactive (05/06/2023)   Exercise Vital Sign    Days of Exercise per Week: 0 days    Minutes of Exercise per Session: 0 min  Stress: No Stress Concern Present (05/06/2023)   [employer] of Occupational Health - Occupational Stress Questionnaire    Feeling of Stress : Not at all  Social Connections: Moderately Isolated (05/06/2023)   Social Connection and Isolation Panel [NHANES]    Frequency of Communication with Friends and Family: More than three times a week    Frequency of Social Gatherings with Friends and Family: Three times a week    Attends Religious Services: 1 to 4 times per year    Active Member of Clubs or Organizations: No    Attends Banker Meetings: Never    Marital Status: Widowed  Intimate Partner Violence: Not At Risk (05/06/2023)   Humiliation, Afraid, Rape, and Kick questionnaire    Fear of Current or Ex-Partner: No    Emotionally Abused: No    Physically Abused: No    Sexually Abused: No   Family History  Problem Relation Age of Onset   Clotting disorder Mother        Cerebral hemorrhage   Other Mother        cerebral hemorrhage   Emphysema Father        COD   Lung cancer Brother    Heart disease Neg Hx    LMP  (LMP Unknown)   Wt Readings from Last 3 Encounters:  08/13/23 99.5 kg (219 lb 6.4 oz)  07/22/23 98 kg (216 lb 2 oz)  07/02/23 96.2 kg (212 lb)  PHYSICAL EXAM:  General: NAD Neck: JVP 8 cm, no thyromegaly or thyroid nodule.  Lungs: Clear to auscultation bilaterally with  normal respiratory effort. CV: Nondisplaced PMI.  Heart irregular S1/S2, no S3/S4, no murmur.  Trace ankle edema.  No carotid bruit.  Normal pedal pulses.  Abdomen: Soft, nontender, no hepatosplenomegaly, no distention.  Skin: Intact without lesions or rashes.  Neurologic: Alert and oriented x 3.  Psych: Normal affect. Extremities: No clubbing or cyanosis.  HEENT: Normal.   ASSESSMENT & PLAN: 1. Chronic systolic CHF with prominent RV failure: Ischemic cardiomyopathy.  Echo in 7/19 showed LV EF 45-50% (improved) with mildly dilated/mildly dysfunction RV.  D-shaped septum suggested RV pressure/volume overload.  Echo in 9/20 showed EF 45%, mildly dysfunctional RV, PASP 46 mmHg with dilated IVC.  PYP scan negative and myeloma workup negative, no evidence for cardiac amyloidosis.  Echo in 4/22 showed EF 45% with diffuse hypokinesis, mildly decreased RV systolic function, PASP 47, IVC dilated, moderate TR, mild MR.  Echo 3/24 showed EF 50%, mildly decreased RV systolic function, dilated IVC, PASP 47 mmHg. Chronic NYHA class II-III. She is not significantly volume overloaded today, weight stable.  - Continue Jardiance 10 mg daily.  - Continue torsemide 80 mg bid, BMET and BNP today. - Continue Coreg 6.25 mg bid.   - Continue spironolactone 12.5 mg daily.   - Recommended Cardiomems placement, unfortunately not approved with insurance.   - Continue compression stockings.  2. CAD s/p CABG: No chest pain.  - Continue atorvastatin 40 mg daily. Good lipids in 3/24.  - No ASA with stable CAD on Eliquis.  3. Atrial fibrillation: Chronic. Given long-term atrial fibrillation, she is unlikely to successfully cardiovert.  - Continue Eliquis 2.5 mg bid.  No bleeding issues. 4. CKD stage 3: BMET today.  5. H/o CVA: Continue Eliquis.   6. OSA: She cannot tolerate CPAP.  7. HTN: BP controlled.   - Continue current meds.  Follow up in 3 months with APP.  Signed, Jacklynn Ganong, FNP  09/10/2023  [facility] [address] [facility] [place] [address] [phone] (office) [phone] (fax)

## 2023-09-11 ENCOUNTER — Ambulatory Visit (HOSPITAL_COMMUNITY)
Admission: RE | Admit: 2023-09-11 | Discharge: 2023-09-11 | Disposition: A | Discharge status: Home / Self Care | Payer: Medicare HMO | Source: Ambulatory Visit | Attending: Family Medicine | Admitting: Family Medicine

## 2023-09-11 ENCOUNTER — Encounter (HOSPITAL_COMMUNITY): Payer: Self-pay

## 2023-09-11 VITALS — BP 122/64 | HR 70 | Wt 211.8 lb

## 2023-09-11 DIAGNOSIS — I4819 Other persistent atrial fibrillation: Secondary | ICD-10-CM | POA: Diagnosis not present

## 2023-09-11 DIAGNOSIS — N183 Chronic kidney disease, stage 3 unspecified: Secondary | ICD-10-CM | POA: Diagnosis not present

## 2023-09-11 DIAGNOSIS — I2510 Atherosclerotic heart disease of native coronary artery without angina pectoris: Secondary | ICD-10-CM | POA: Diagnosis not present

## 2023-09-11 DIAGNOSIS — E1122 Type 2 diabetes mellitus with diabetic chronic kidney disease: Secondary | ICD-10-CM | POA: Diagnosis not present

## 2023-09-11 DIAGNOSIS — I130 Hypertensive heart and chronic kidney disease with heart failure and stage 1 through stage 4 chronic kidney disease, or unspecified chronic kidney disease: Secondary | ICD-10-CM | POA: Insufficient documentation

## 2023-09-11 DIAGNOSIS — Z7985 Long-term (current) use of injectable non-insulin antidiabetic drugs: Secondary | ICD-10-CM | POA: Diagnosis not present

## 2023-09-11 DIAGNOSIS — I10 Essential (primary) hypertension: Secondary | ICD-10-CM | POA: Diagnosis not present

## 2023-09-11 DIAGNOSIS — M19011 Primary osteoarthritis, right shoulder: Secondary | ICD-10-CM | POA: Insufficient documentation

## 2023-09-11 DIAGNOSIS — Z6831 Body mass index (BMI) 31.0-31.9, adult: Secondary | ICD-10-CM | POA: Diagnosis not present

## 2023-09-11 DIAGNOSIS — I272 Pulmonary hypertension, unspecified: Secondary | ICD-10-CM | POA: Diagnosis not present

## 2023-09-11 DIAGNOSIS — Z79899 Other long term (current) drug therapy: Secondary | ICD-10-CM | POA: Insufficient documentation

## 2023-09-11 DIAGNOSIS — M19012 Primary osteoarthritis, left shoulder: Secondary | ICD-10-CM | POA: Diagnosis not present

## 2023-09-11 DIAGNOSIS — Z7984 Long term (current) use of oral hypoglycemic drugs: Secondary | ICD-10-CM | POA: Diagnosis not present

## 2023-09-11 DIAGNOSIS — Z8673 Personal history of transient ischemic attack (TIA), and cerebral infarction without residual deficits: Secondary | ICD-10-CM | POA: Insufficient documentation

## 2023-09-11 DIAGNOSIS — I255 Ischemic cardiomyopathy: Secondary | ICD-10-CM | POA: Insufficient documentation

## 2023-09-11 DIAGNOSIS — I5022 Chronic systolic (congestive) heart failure: Secondary | ICD-10-CM | POA: Insufficient documentation

## 2023-09-11 DIAGNOSIS — Z951 Presence of aortocoronary bypass graft: Secondary | ICD-10-CM | POA: Insufficient documentation

## 2023-09-11 DIAGNOSIS — Z7901 Long term (current) use of anticoagulants: Secondary | ICD-10-CM | POA: Diagnosis not present

## 2023-09-11 DIAGNOSIS — G4733 Obstructive sleep apnea (adult) (pediatric): Secondary | ICD-10-CM | POA: Insufficient documentation

## 2023-09-11 LAB — CBC
HCT: 37.1 % (ref 36.0–46.0)
Hemoglobin: 11.6 g/dL — ABNORMAL LOW (ref 12.0–15.0)
MCH: 29.9 pg (ref 26.0–34.0)
MCHC: 31.3 g/dL (ref 30.0–36.0)
MCV: 95.6 fL (ref 80.0–100.0)
Platelets: 143 10*3/uL — ABNORMAL LOW (ref 150–400)
RBC: 3.88 MIL/uL (ref 3.87–5.11)
RDW: 14.5 % (ref 11.5–15.5)
WBC: 5.8 10*3/uL (ref 4.0–10.5)
nRBC: 0 % (ref 0.0–0.2)

## 2023-09-11 LAB — BRAIN NATRIURETIC PEPTIDE: B Natriuretic Peptide: 99.4 pg/mL (ref 0.0–100.0)

## 2023-09-11 NOTE — Patient Instructions (Addendum)
Thank you for coming in today  If you had labs drawn today, any labs that are abnormal the clinic will call you No news is good news  Medications: No changes   Follow up appointments:  Your physician recommends that you schedule a follow-up appointment in:  4 months With Dr. Shirlee Latch with echocardiogram You will receive a reminder letter in the mail a few months in advance. If you don't receive a letter, please call our office to schedule the follow-up appointment.   Your physician has requested that you have an echocardiogram. Echocardiography is a painless test that uses sound waves to create images of your heart. It provides your doctor with information about the size and shape of your heart and how well your heart's chambers and valves are working. This procedure takes approximately one hour. There are no restrictions for this procedure.      Do the following things EVERYDAY: Weigh yourself in the morning before breakfast. Write it down and keep it in a log. Take your medicines as prescribed Eat low salt foods--Limit salt (sodium) to 2000 mg per day.  Stay as active as you can everyday Limit all fluids for the day to less than 2 liters   At the [facility], you and your health needs are our priority. As part of our continuing mission to provide you with exceptional heart care, we have created designated Provider Care Teams. These Care Teams include your primary Cardiologist (physician) and Advanced Practice Providers (APPs- Physician Assistants and Nurse Practitioners) who all work together to provide you with the care you need, when you need it.   You may see any of the following providers on your designated Care Team at your next follow up: Dr Arvilla Meres Dr Marca Ancona Dr. Marcos Eke, NP Robbie Lis, [place] [facility] [place], [place] Brynda Peon, NP Karle Plumber, PharmD   Please be sure to bring in all your medications  bottles to every appointment.    Thank you for choosing [facility]  If you have any questions or concerns before your next appointment please send [place] a message through [place] or call our office at [phone].    TO LEAVE A MESSAGE FOR THE NURSE SELECT OPTION 2, PLEASE LEAVE A MESSAGE INCLUDING: YOUR NAME DATE OF BIRTH CALL BACK NUMBER REASON FOR CALL**this is important as we prioritize the call backs  YOU WILL RECEIVE A CALL BACK THE SAME DAY AS LONG AS YOU CALL BEFORE 4:00 PM

## 2023-09-12 ENCOUNTER — Telehealth (HOSPITAL_COMMUNITY): Payer: Self-pay | Admitting: *Deleted

## 2023-09-12 DIAGNOSIS — F32A Depression, unspecified: Secondary | ICD-10-CM | POA: Diagnosis not present

## 2023-09-12 DIAGNOSIS — I5022 Chronic systolic (congestive) heart failure: Secondary | ICD-10-CM

## 2023-09-12 DIAGNOSIS — Z9181 History of falling: Secondary | ICD-10-CM | POA: Diagnosis not present

## 2023-09-12 DIAGNOSIS — I519 Heart disease, unspecified: Secondary | ICD-10-CM | POA: Diagnosis not present

## 2023-09-12 DIAGNOSIS — Z7985 Long-term (current) use of injectable non-insulin antidiabetic drugs: Secondary | ICD-10-CM | POA: Diagnosis not present

## 2023-09-12 DIAGNOSIS — L89151 Pressure ulcer of sacral region, stage 1: Secondary | ICD-10-CM | POA: Diagnosis not present

## 2023-09-12 DIAGNOSIS — E876 Hypokalemia: Secondary | ICD-10-CM | POA: Diagnosis not present

## 2023-09-12 DIAGNOSIS — Z7984 Long term (current) use of oral hypoglycemic drugs: Secondary | ICD-10-CM | POA: Diagnosis not present

## 2023-09-12 DIAGNOSIS — Z7901 Long term (current) use of anticoagulants: Secondary | ICD-10-CM | POA: Diagnosis not present

## 2023-09-12 DIAGNOSIS — M19011 Primary osteoarthritis, right shoulder: Secondary | ICD-10-CM | POA: Diagnosis not present

## 2023-09-12 DIAGNOSIS — E119 Type 2 diabetes mellitus without complications: Secondary | ICD-10-CM | POA: Diagnosis not present

## 2023-09-12 DIAGNOSIS — D649 Anemia, unspecified: Secondary | ICD-10-CM | POA: Diagnosis not present

## 2023-09-12 DIAGNOSIS — M19012 Primary osteoarthritis, left shoulder: Secondary | ICD-10-CM | POA: Diagnosis not present

## 2023-09-12 DIAGNOSIS — E7800 Pure hypercholesterolemia, unspecified: Secondary | ICD-10-CM | POA: Diagnosis not present

## 2023-09-12 NOTE — Telephone Encounter (Signed)
Called patient per [name] Rome, NP with following lab results and instructions:  "Labs stable. Patient should have a BMET. Please call lab and see if they can draw from sample. If not, she will need to come back for BMET"  Pt verbalized understanding of same. Lab ordered and scheduled.

## 2023-09-13 DIAGNOSIS — Z7984 Long term (current) use of oral hypoglycemic drugs: Secondary | ICD-10-CM | POA: Diagnosis not present

## 2023-09-13 DIAGNOSIS — L89151 Pressure ulcer of sacral region, stage 1: Secondary | ICD-10-CM | POA: Diagnosis not present

## 2023-09-13 DIAGNOSIS — M19012 Primary osteoarthritis, left shoulder: Secondary | ICD-10-CM | POA: Diagnosis not present

## 2023-09-13 DIAGNOSIS — E876 Hypokalemia: Secondary | ICD-10-CM | POA: Diagnosis not present

## 2023-09-13 DIAGNOSIS — Z7901 Long term (current) use of anticoagulants: Secondary | ICD-10-CM | POA: Diagnosis not present

## 2023-09-13 DIAGNOSIS — I519 Heart disease, unspecified: Secondary | ICD-10-CM | POA: Diagnosis not present

## 2023-09-13 DIAGNOSIS — E7800 Pure hypercholesterolemia, unspecified: Secondary | ICD-10-CM | POA: Diagnosis not present

## 2023-09-13 DIAGNOSIS — E119 Type 2 diabetes mellitus without complications: Secondary | ICD-10-CM | POA: Diagnosis not present

## 2023-09-13 DIAGNOSIS — Z9181 History of falling: Secondary | ICD-10-CM | POA: Diagnosis not present

## 2023-09-13 DIAGNOSIS — F32A Depression, unspecified: Secondary | ICD-10-CM | POA: Diagnosis not present

## 2023-09-13 DIAGNOSIS — Z7985 Long-term (current) use of injectable non-insulin antidiabetic drugs: Secondary | ICD-10-CM | POA: Diagnosis not present

## 2023-09-13 DIAGNOSIS — M19011 Primary osteoarthritis, right shoulder: Secondary | ICD-10-CM | POA: Diagnosis not present

## 2023-09-13 DIAGNOSIS — D649 Anemia, unspecified: Secondary | ICD-10-CM | POA: Diagnosis not present

## 2023-09-17 ENCOUNTER — Encounter: Payer: Self-pay | Admitting: Family Medicine

## 2023-09-17 ENCOUNTER — Ambulatory Visit (HOSPITAL_COMMUNITY)
Admission: RE | Admit: 2023-09-17 | Discharge: 2023-09-17 | Disposition: A | Discharge status: Home / Self Care | Payer: Medicare HMO | Source: Ambulatory Visit | Attending: Internal Medicine | Admitting: Internal Medicine

## 2023-09-17 ENCOUNTER — Ambulatory Visit (INDEPENDENT_AMBULATORY_CARE_PROVIDER_SITE_OTHER): Payer: Medicare HMO | Admitting: Family Medicine

## 2023-09-17 VITALS — BP 120/54 | HR 69 | Ht 69.0 in | Wt 209.8 lb

## 2023-09-17 DIAGNOSIS — D649 Anemia, unspecified: Secondary | ICD-10-CM | POA: Diagnosis not present

## 2023-09-17 DIAGNOSIS — F32A Depression, unspecified: Secondary | ICD-10-CM | POA: Diagnosis not present

## 2023-09-17 DIAGNOSIS — E119 Type 2 diabetes mellitus without complications: Secondary | ICD-10-CM | POA: Diagnosis not present

## 2023-09-17 DIAGNOSIS — I5022 Chronic systolic (congestive) heart failure: Secondary | ICD-10-CM | POA: Insufficient documentation

## 2023-09-17 DIAGNOSIS — Z23 Encounter for immunization: Secondary | ICD-10-CM | POA: Diagnosis not present

## 2023-09-17 DIAGNOSIS — N949 Unspecified condition associated with female genital organs and menstrual cycle: Secondary | ICD-10-CM | POA: Diagnosis not present

## 2023-09-17 DIAGNOSIS — I519 Heart disease, unspecified: Secondary | ICD-10-CM | POA: Diagnosis not present

## 2023-09-17 DIAGNOSIS — E876 Hypokalemia: Secondary | ICD-10-CM | POA: Diagnosis not present

## 2023-09-17 DIAGNOSIS — Z9181 History of falling: Secondary | ICD-10-CM | POA: Diagnosis not present

## 2023-09-17 DIAGNOSIS — Z7984 Long term (current) use of oral hypoglycemic drugs: Secondary | ICD-10-CM | POA: Diagnosis not present

## 2023-09-17 DIAGNOSIS — M19012 Primary osteoarthritis, left shoulder: Secondary | ICD-10-CM

## 2023-09-17 DIAGNOSIS — L89151 Pressure ulcer of sacral region, stage 1: Secondary | ICD-10-CM | POA: Diagnosis not present

## 2023-09-17 DIAGNOSIS — M19011 Primary osteoarthritis, right shoulder: Secondary | ICD-10-CM | POA: Diagnosis not present

## 2023-09-17 DIAGNOSIS — E7800 Pure hypercholesterolemia, unspecified: Secondary | ICD-10-CM | POA: Diagnosis not present

## 2023-09-17 DIAGNOSIS — Z7985 Long-term (current) use of injectable non-insulin antidiabetic drugs: Secondary | ICD-10-CM | POA: Diagnosis not present

## 2023-09-17 DIAGNOSIS — Z7901 Long term (current) use of anticoagulants: Secondary | ICD-10-CM | POA: Diagnosis not present

## 2023-09-17 LAB — BASIC METABOLIC PANEL
Anion gap: 10 (ref 5–15)
BUN: 66 mg/dL — ABNORMAL HIGH (ref 8–23)
CO2: 28 mmol/L (ref 22–32)
Calcium: 10 mg/dL (ref 8.9–10.3)
Chloride: 102 mmol/L (ref 98–111)
Creatinine, Ser: 1.99 mg/dL — ABNORMAL HIGH (ref 0.44–1.00)
GFR, Estimated: 24 mL/min — ABNORMAL LOW (ref >60)
Glucose, Bld: 128 mg/dL — ABNORMAL HIGH (ref 70–99)
Potassium: 4.7 mmol/L (ref 3.5–5.1)
Sodium: 140 mmol/L (ref 135–145)

## 2023-09-17 NOTE — Patient Instructions (Addendum)
[name],  It was lovely seeing you in clinic today! You came in for follow-up of osteoarthritis in your shoulders. We are glad to hear the increased Tramadol dose helped some! We also gave you your COVID booster today  There are a few things for you to do after today's visit: Continue to work with home physical therapy on exercises for your arms and shoulders Continue to use Tramadol 50 mg every 6 hours as needed for significant pain. Try to use it as sparingly as possible. On the good days, try not to take it more than once. Continue to use Voltaren gel, heating pad, and tylenol for pain Please let us know if you would like to get steroid shots in your shoulders, and we can arrange this. We gave you your COVID vaccine today.  Thank you for allowing [place] to be a part of your care team! [name], medical student Dr. Janeal Holmes, PGY2

## 2023-09-17 NOTE — Assessment & Plan Note (Signed)
Pt reports improved pain with increased Tramadol dose of 50 mg q6h prn; she is taking a half to a whole dose BID. She is also undergoing home PT and using Voltaren gel, tylenol, and heating pad. She does not want to pursue steroid shots at this time. - Continue Tramadol 50 mg q6h prn - Continue Voltaren gel, tylenol, heating pad - Pt to return once she is wanting steroid shots

## 2023-09-17 NOTE — Progress Notes (Signed)
    SUBJECTIVE:   CHIEF COMPLAINT / HPI:   [name] is a 82 y.o. female who presents today for follow-up of bilateral shoulder osteoarthritis.  Currently on Tramadol 50 mg Q6h prn for pain; currently taking this twice a day; she reports this is helping a lot compared to prior. Also using Voltaren gel and heating pads and tylenol. She is seeing home PT. She does not want shots in her shoulders at this time and is wanting to give PT some more time to work. She does report her current shoulder pain is a 7-8/10; was an 8-9/10 before the dose increase.  She thinks she might have a UTI currently as she has had increased vaginal pressure which began a week and a half ago. No burning or pain with peeing. She has applied a topical antibiotic to the vaginal region. No pain today. No fevers, no systemic symptoms.  She reports she had a pressure ulcer of her bottom which her home therapy nurse looked at recently and said it was closed.  Pt would like to get her COVID shot today.  PERTINENT  PMH / PSH: CAD status post CABG 2009, ischemic cardiomyopathy with chronic systolic CHF, persistent atrial fibrillation with stroke in 03/2016 when INR was subtherapeutic, stage III CKD, morbid obesity, pulmonary hypertension, diabetes, GI bleed 03/2017, and severe sleep apnea   OBJECTIVE:   BP (!) 120/54   Pulse 69   Ht 5\' 9"  (1.753 m)   Wt 209 lb 12.8 oz (95.2 kg)   LMP  (LMP Unknown)   SpO2 100%   BMI 30.98 kg/m   General: Pt is seated in chair. No acute distress. She ambulates with use of a walker. Pulmonary: Normal work of breathing. Lungs clear to auscultation bilaterally. MSK: No tenderness to palpation of bilateral shoulders. Crepitus with passive movement of bilateral shoulders. Tenderness with moving shoulders. Neuro/Psych: Alert and oriented to person, place, event, time. Normal affect.  ASSESSMENT/PLAN:   Osteoarthritis of both shoulders Pt reports improved pain with increased Tramadol dose  of 50 mg q6h prn; she is taking a half to a whole dose BID. She is also undergoing home PT and using Voltaren gel, tylenol, and heating pad. She does not want to pursue steroid shots at this time. - Continue Tramadol 50 mg q6h prn and continuing to use as sparingly as possible - Continue Voltaren gel, tylenol, heating pad - Pt to return if she is wanting steroid shots   Vaginal discomfort Given pt's description of symptoms, low concern for UTI at this time. Suspect vaginal dryness/atrophy component. Pt can continue lubricating as needed. Consider prolapse if symptoms reoccur.  - No further workup indicated at this time  Healthcare maintenance COVID booster given in clinic today.  [name], Medical Student [facility]  I was personally present and performed or re-performed the history, physical exam and medical decision making activities of this service and have verified that the service and findings are accurately documented in the student's note.  Janeal Holmes, MD                  09/17/2023, 5:49 PM

## 2023-09-18 ENCOUNTER — Other Ambulatory Visit: Payer: Self-pay

## 2023-09-18 DIAGNOSIS — E119 Type 2 diabetes mellitus without complications: Secondary | ICD-10-CM | POA: Diagnosis not present

## 2023-09-18 DIAGNOSIS — D649 Anemia, unspecified: Secondary | ICD-10-CM | POA: Diagnosis not present

## 2023-09-18 DIAGNOSIS — Z7901 Long term (current) use of anticoagulants: Secondary | ICD-10-CM | POA: Diagnosis not present

## 2023-09-18 DIAGNOSIS — L89151 Pressure ulcer of sacral region, stage 1: Secondary | ICD-10-CM | POA: Diagnosis not present

## 2023-09-18 DIAGNOSIS — Z9181 History of falling: Secondary | ICD-10-CM | POA: Diagnosis not present

## 2023-09-18 DIAGNOSIS — E876 Hypokalemia: Secondary | ICD-10-CM | POA: Diagnosis not present

## 2023-09-18 DIAGNOSIS — M19011 Primary osteoarthritis, right shoulder: Secondary | ICD-10-CM | POA: Diagnosis not present

## 2023-09-18 DIAGNOSIS — I519 Heart disease, unspecified: Secondary | ICD-10-CM | POA: Diagnosis not present

## 2023-09-18 DIAGNOSIS — Z7985 Long-term (current) use of injectable non-insulin antidiabetic drugs: Secondary | ICD-10-CM | POA: Diagnosis not present

## 2023-09-18 DIAGNOSIS — E1142 Type 2 diabetes mellitus with diabetic polyneuropathy: Secondary | ICD-10-CM

## 2023-09-18 DIAGNOSIS — M19012 Primary osteoarthritis, left shoulder: Secondary | ICD-10-CM | POA: Diagnosis not present

## 2023-09-18 DIAGNOSIS — Z7984 Long term (current) use of oral hypoglycemic drugs: Secondary | ICD-10-CM | POA: Diagnosis not present

## 2023-09-18 DIAGNOSIS — E7800 Pure hypercholesterolemia, unspecified: Secondary | ICD-10-CM | POA: Diagnosis not present

## 2023-09-18 DIAGNOSIS — F32A Depression, unspecified: Secondary | ICD-10-CM | POA: Diagnosis not present

## 2023-09-18 MED ORDER — TRULICITY 0.75 MG/0.5ML ~~LOC~~ SOAJ: 0.7500 mg | SUBCUTANEOUS | 0 refills | Status: DC

## 2023-09-19 DIAGNOSIS — F32A Depression, unspecified: Secondary | ICD-10-CM | POA: Diagnosis not present

## 2023-09-19 DIAGNOSIS — E7800 Pure hypercholesterolemia, unspecified: Secondary | ICD-10-CM | POA: Diagnosis not present

## 2023-09-19 DIAGNOSIS — E876 Hypokalemia: Secondary | ICD-10-CM | POA: Diagnosis not present

## 2023-09-19 DIAGNOSIS — Z7985 Long-term (current) use of injectable non-insulin antidiabetic drugs: Secondary | ICD-10-CM | POA: Diagnosis not present

## 2023-09-19 DIAGNOSIS — M19011 Primary osteoarthritis, right shoulder: Secondary | ICD-10-CM | POA: Diagnosis not present

## 2023-09-19 DIAGNOSIS — Z7984 Long term (current) use of oral hypoglycemic drugs: Secondary | ICD-10-CM | POA: Diagnosis not present

## 2023-09-19 DIAGNOSIS — E119 Type 2 diabetes mellitus without complications: Secondary | ICD-10-CM | POA: Diagnosis not present

## 2023-09-19 DIAGNOSIS — D649 Anemia, unspecified: Secondary | ICD-10-CM | POA: Diagnosis not present

## 2023-09-19 DIAGNOSIS — I519 Heart disease, unspecified: Secondary | ICD-10-CM | POA: Diagnosis not present

## 2023-09-19 DIAGNOSIS — Z9181 History of falling: Secondary | ICD-10-CM | POA: Diagnosis not present

## 2023-09-19 DIAGNOSIS — M19012 Primary osteoarthritis, left shoulder: Secondary | ICD-10-CM | POA: Diagnosis not present

## 2023-09-19 DIAGNOSIS — L89151 Pressure ulcer of sacral region, stage 1: Secondary | ICD-10-CM | POA: Diagnosis not present

## 2023-09-19 DIAGNOSIS — Z7901 Long term (current) use of anticoagulants: Secondary | ICD-10-CM | POA: Diagnosis not present

## 2023-09-23 ENCOUNTER — Other Ambulatory Visit: Payer: Self-pay

## 2023-09-23 ENCOUNTER — Other Ambulatory Visit: Payer: Self-pay | Admitting: Family Medicine

## 2023-09-23 DIAGNOSIS — E876 Hypokalemia: Secondary | ICD-10-CM | POA: Diagnosis not present

## 2023-09-23 DIAGNOSIS — L89151 Pressure ulcer of sacral region, stage 1: Secondary | ICD-10-CM | POA: Diagnosis not present

## 2023-09-23 DIAGNOSIS — M19012 Primary osteoarthritis, left shoulder: Secondary | ICD-10-CM | POA: Diagnosis not present

## 2023-09-23 DIAGNOSIS — E7800 Pure hypercholesterolemia, unspecified: Secondary | ICD-10-CM | POA: Diagnosis not present

## 2023-09-23 DIAGNOSIS — M19011 Primary osteoarthritis, right shoulder: Secondary | ICD-10-CM | POA: Diagnosis not present

## 2023-09-23 DIAGNOSIS — Z7901 Long term (current) use of anticoagulants: Secondary | ICD-10-CM | POA: Diagnosis not present

## 2023-09-23 DIAGNOSIS — F32A Depression, unspecified: Secondary | ICD-10-CM | POA: Diagnosis not present

## 2023-09-23 DIAGNOSIS — Z9181 History of falling: Secondary | ICD-10-CM | POA: Diagnosis not present

## 2023-09-23 DIAGNOSIS — D649 Anemia, unspecified: Secondary | ICD-10-CM | POA: Diagnosis not present

## 2023-09-23 DIAGNOSIS — Z7985 Long-term (current) use of injectable non-insulin antidiabetic drugs: Secondary | ICD-10-CM | POA: Diagnosis not present

## 2023-09-23 DIAGNOSIS — Z7984 Long term (current) use of oral hypoglycemic drugs: Secondary | ICD-10-CM | POA: Diagnosis not present

## 2023-09-23 DIAGNOSIS — E119 Type 2 diabetes mellitus without complications: Secondary | ICD-10-CM | POA: Diagnosis not present

## 2023-09-23 DIAGNOSIS — I519 Heart disease, unspecified: Secondary | ICD-10-CM | POA: Diagnosis not present

## 2023-09-23 MED ORDER — TRAMADOL HCL 50 MG PO TABS: 50.0000 mg | ORAL_TABLET | Freq: Four times a day (QID) | ORAL | 0 refills | Status: DC | PRN

## 2023-09-24 ENCOUNTER — Other Ambulatory Visit: Payer: Self-pay | Admitting: *Deleted

## 2023-09-24 NOTE — Patient Outreach (Signed)
Care Management   Visit Note  09/24/2023 Name: [name] MRN: [id number] DOB: [birth year]  Subjective: [name] is a 82 y.o. year old female who is a primary care patient of Evette Georges, MD. The Care Management team was consulted for assistance.      Engaged with patient spoke with patient by telephone.    Goals Addressed             This Visit's Progress    RNCM Care Management Expected Outcome: Monitor, Self-Manage and Reduce Symptoms of: Hypertension, DM, Osteoporosis       Current Barriers:  Knowledge Deficits related to plan of care for management of HTN, DMII, and Osteoporosis  Chronic Disease Management support and education needs related to HTN, DMII, and Osteoporosis   RNCM Clinical Goal(s):  Patient will verbalize basic understanding of  HTN, DMII, and Osteoporosis disease process and self health management plan as evidenced by verbal explanation, recognizing symptoms, lifestyle modifications to include maintaining a heart healthy/diabetic diet, remaining free of falls and continuing home exercises provided by PT as tolerated take all medications exactly as prescribed and will call provider for medication related questions as evidenced by compliance to all medications and monitoring for any side effects and notifying primary care provider, if any should arise.  attend all scheduled medical appointments: with primary care provider and specialist as evidenced by keeping all scheduled appointments demonstrate Improved and Ongoing adherence to prescribed treatment plan for HTN, DMII, and Osteoporosis as evidenced by consistent medication compliance, symptom monitoring, continued self-managment continue to work with RN Care Manager to address care management and care coordination needs related to  HTN, DMII, and Osteoporosis as evidenced by adherence to CM Team Scheduled appointments through collaboration with RN Care manager, provider, and care team.    Interventions: Evaluation of current treatment plan related to  self management and patient's adherence to plan as established by provider   Diabetes Interventions:  (Status:  New goal. and Goal on track:  Yes.) Long Term Goal Assessed patient's understanding of A1c goal: <8% Provided education to patient about basic DM disease process Reviewed medications with patient and discussed importance of medication adherence Counseled on importance of regular laboratory monitoring as prescribed Discussed plans with patient for ongoing care management follow up and provided patient with direct contact information for care management team Reviewed scheduled/upcoming provider appointments including: Diabetic Foot Exam with Podiatry on 11-20-2023 Review of patient status, including review of consultants reports, relevant laboratory and other test results, and medications completed Screening for signs and symptoms of depression related to chronic disease state  Assessed social determinant of health barriers Lab Results  Component Value Date   HGBA1C 6.3 05/21/2023    Hypertension Interventions:  (Status:  New goal. and Goal on track:  Yes.) Long Term Goal Last practice recorded BP readings:  BP Readings from Last 3 Encounters:  09/17/23 (!) 120/54  09/11/23 122/64  08/13/23 (!) 142/68   Most recent eGFR/CrCl:  Lab Results  Component Value Date   EGFR 25 (L) 07/04/2023    No components found for: "CRCL"  Evaluation of current treatment plan related to hypertension self management and patient's adherence to plan as established by provider Provided education to patient re: stroke prevention, s/s of heart attack and stroke Reviewed medications with patient and discussed importance of compliance Discussed plans with patient for ongoing care management follow up and provided patient with direct contact information for care management team Advised patient, providing education and rationale,  to  monitor blood pressure daily and record, calling PCP for findings outside established parameters Discussed complications of poorly controlled blood pressure such as heart disease, stroke, circulatory complications, vision complications, kidney impairment, sexual dysfunction  Osteoporosis Interventions:  (Status:  New goal. and Goal on track:  Yes.) Short Term Goal Pain assessment performed. States she is doing fairly good today as far as both shoulders are concerned. Patient states that she is not a candidate for rotator cuff surgery as her primary care provider would not provide clearance stating it was too risky due to age. States she is having continued pain related to a sacral wound. HH RN scheduled to see patient today and patient will ask for sacral area to be reassessed. Medications reviewed. Continue taking tramadol as prescribed and follow MD recommendations Reviewed provider established plan for pain management Discussed importance of adherence to all scheduled medical appointments Counseled on the importance of reporting any/all new or changed pain symptoms or management strategies to pain management provider Advised patient to report to care team affect of pain on daily activities Discussed use of relaxation techniques and/or diversional activities to assist with pain reduction (distraction, imagery, relaxation, massage, acupressure, TENS, heat, and cold application  Patient Goals/Self-Care Activities: Take all medications as prescribed Attend all scheduled provider appointments Call pharmacy for medication refills 3-7 days in advance of running out of medications Call provider office for new concerns or questions  keep appointment with eye doctor check blood sugar at prescribed times: once daily and when you have symptoms of low or high blood sugar check feet daily for cuts, sores or redness enter blood sugar readings and medication or insulin into daily log keep a blood pressure  log call doctor for signs and symptoms of high blood pressure take medications for blood pressure exactly as prescribed report new symptoms to your doctor  Follow Up Plan:  Telephone follow up appointment with care management team member scheduled for:  10-22-2023 at 10:30 am              Consent to Services:  Patient was given information about care management services, agreed to services, and gave verbal consent to participate.   Plan: Telephone follow up appointment with care management team member scheduled for: 10-22-2023 at 10:30 am  Danise Edge, BSN RN RN Care Manager  [facility]  Ambulatory Care Management  Direct Number: [phone]

## 2023-09-24 NOTE — Patient Instructions (Signed)
Visit Information  Thank you for taking time to visit with me today. Please don't hesitate to contact me if I can be of assistance to you before our next scheduled telephone appointment.  Following are the goals we discussed today:   Goals Addressed             This Visit's Progress    RNCM Care Management Expected Outcome: Monitor, Self-Manage and Reduce Symptoms of: Hypertension, DM, Osteoporosis       Current Barriers:  Knowledge Deficits related to plan of care for management of HTN, DMII, and Osteoporosis  Chronic Disease Management support and education needs related to HTN, DMII, and Osteoporosis   RNCM Clinical Goal(s):  Patient will verbalize basic understanding of  HTN, DMII, and Osteoporosis disease process and self health management plan as evidenced by verbal explanation, recognizing symptoms, lifestyle modifications to include maintaining a heart healthy/diabetic diet, remaining free of falls and continuing home exercises provided by PT as tolerated take all medications exactly as prescribed and will call provider for medication related questions as evidenced by compliance to all medications and monitoring for any side effects and notifying primary care provider, if any should arise.  attend all scheduled medical appointments: with primary care provider and specialist as evidenced by keeping all scheduled appointments demonstrate Improved and Ongoing adherence to prescribed treatment plan for HTN, DMII, and Osteoporosis as evidenced by consistent medication compliance, symptom monitoring, continued self-managment continue to work with RN Care Manager to address care management and care coordination needs related to  HTN, DMII, and Osteoporosis as evidenced by adherence to CM Team Scheduled appointments through collaboration with RN Care manager, provider, and care team.   Interventions: Evaluation of current treatment plan related to  self management and patient's adherence to  plan as established by provider   Diabetes Interventions:  (Status:  New goal. and Goal on track:  Yes.) Long Term Goal Assessed patient's understanding of A1c goal: <8% Provided education to patient about basic DM disease process Reviewed medications with patient and discussed importance of medication adherence Counseled on importance of regular laboratory monitoring as prescribed Discussed plans with patient for ongoing care management follow up and provided patient with direct contact information for care management team Reviewed scheduled/upcoming provider appointments including: Diabetic Foot Exam with Podiatry on 11-20-2023 Review of patient status, including review of consultants reports, relevant laboratory and other test results, and medications completed Screening for signs and symptoms of depression related to chronic disease state  Assessed social determinant of health barriers Lab Results  Component Value Date   HGBA1C 6.3 05/21/2023    Hypertension Interventions:  (Status:  New goal. and Goal on track:  Yes.) Long Term Goal Last practice recorded BP readings:  BP Readings from Last 3 Encounters:  09/17/23 (!) 120/54  09/11/23 122/64  08/13/23 (!) 142/68   Most recent eGFR/CrCl:  Lab Results  Component Value Date   EGFR 25 (L) 07/04/2023    No components found for: "CRCL"  Evaluation of current treatment plan related to hypertension self management and patient's adherence to plan as established by provider Provided education to patient re: stroke prevention, s/s of heart attack and stroke Reviewed medications with patient and discussed importance of compliance Discussed plans with patient for ongoing care management follow up and provided patient with direct contact information for care management team Advised patient, providing education and rationale, to monitor blood pressure daily and record, calling PCP for findings outside established parameters Discussed  complications of poorly  controlled blood pressure such as heart disease, stroke, circulatory complications, vision complications, kidney impairment, sexual dysfunction  Osteoporosis Interventions:  (Status:  New goal. and Goal on track:  Yes.) Short Term Goal Pain assessment performed. States she is doing fairly good today as far as both shoulders are concerned. Patient states that she is not a candidate for rotator cuff surgery as her primary care provider would not provide clearance stating it was too risky due to age. States she is having continued pain related to a sacral wound. HH RN scheduled to see patient today and patient will ask for sacral area to be reassessed. Medications reviewed. Continue taking tramadol as prescribed and follow MD recommendations Reviewed provider established plan for pain management Discussed importance of adherence to all scheduled medical appointments Counseled on the importance of reporting any/all new or changed pain symptoms or management strategies to pain management provider Advised patient to report to care team affect of pain on daily activities Discussed use of relaxation techniques and/or diversional activities to assist with pain reduction (distraction, imagery, relaxation, massage, acupressure, TENS, heat, and cold application  Patient Goals/Self-Care Activities: Take all medications as prescribed Attend all scheduled provider appointments Call pharmacy for medication refills 3-7 days in advance of running out of medications Call provider office for new concerns or questions  keep appointment with eye doctor check blood sugar at prescribed times: once daily and when you have symptoms of low or high blood sugar check feet daily for cuts, sores or redness enter blood sugar readings and medication or insulin into daily log keep a blood pressure log call doctor for signs and symptoms of high blood pressure take medications for blood pressure exactly as  prescribed report new symptoms to your doctor  Follow Up Plan:  Telephone follow up appointment with care management team member scheduled for:  10-22-2023 at 10:30 am           Our next appointment is by telephone on 10-22-2023 at 10:30 am  Please call the care guide team at [phone] if you need to cancel or reschedule your appointment.   If you are experiencing a Mental Health or Behavioral Health Crisis or need someone to talk to, please call the Suicide and Crisis Lifeline: 988 call the [place] National Suicide Prevention Lifeline: 951-575-1720 or TTY: 236 004 2087 TTY 408-152-4493) to talk to a trained counselor call 1-800-273-TALK (toll free, 24 hour hotline) call 911   The patient verbalized understanding of instructions, educational materials, and care plan provided today and DECLINED offer to receive copy of patient instructions, educational materials, and care plan.   Telephone follow up appointment with care management team member scheduled for: 10-22-2023 at 10:30 am  Danise Edge, BSN RN RN Care Manager  [facility]  Ambulatory Care Management  Direct Number: [phone]

## 2023-09-26 DIAGNOSIS — M19011 Primary osteoarthritis, right shoulder: Secondary | ICD-10-CM | POA: Diagnosis not present

## 2023-09-26 DIAGNOSIS — Z7984 Long term (current) use of oral hypoglycemic drugs: Secondary | ICD-10-CM | POA: Diagnosis not present

## 2023-09-26 DIAGNOSIS — F32A Depression, unspecified: Secondary | ICD-10-CM | POA: Diagnosis not present

## 2023-09-26 DIAGNOSIS — E119 Type 2 diabetes mellitus without complications: Secondary | ICD-10-CM | POA: Diagnosis not present

## 2023-09-26 DIAGNOSIS — Z7901 Long term (current) use of anticoagulants: Secondary | ICD-10-CM | POA: Diagnosis not present

## 2023-09-26 DIAGNOSIS — L89151 Pressure ulcer of sacral region, stage 1: Secondary | ICD-10-CM | POA: Diagnosis not present

## 2023-09-26 DIAGNOSIS — E7800 Pure hypercholesterolemia, unspecified: Secondary | ICD-10-CM | POA: Diagnosis not present

## 2023-09-26 DIAGNOSIS — D649 Anemia, unspecified: Secondary | ICD-10-CM | POA: Diagnosis not present

## 2023-09-26 DIAGNOSIS — Z7985 Long-term (current) use of injectable non-insulin antidiabetic drugs: Secondary | ICD-10-CM | POA: Diagnosis not present

## 2023-09-26 DIAGNOSIS — M19012 Primary osteoarthritis, left shoulder: Secondary | ICD-10-CM | POA: Diagnosis not present

## 2023-09-26 DIAGNOSIS — Z9181 History of falling: Secondary | ICD-10-CM | POA: Diagnosis not present

## 2023-09-26 DIAGNOSIS — I519 Heart disease, unspecified: Secondary | ICD-10-CM | POA: Diagnosis not present

## 2023-09-26 DIAGNOSIS — E876 Hypokalemia: Secondary | ICD-10-CM | POA: Diagnosis not present

## 2023-09-30 DIAGNOSIS — F32A Depression, unspecified: Secondary | ICD-10-CM | POA: Diagnosis not present

## 2023-09-30 DIAGNOSIS — I519 Heart disease, unspecified: Secondary | ICD-10-CM | POA: Diagnosis not present

## 2023-09-30 DIAGNOSIS — D649 Anemia, unspecified: Secondary | ICD-10-CM | POA: Diagnosis not present

## 2023-09-30 DIAGNOSIS — Z7984 Long term (current) use of oral hypoglycemic drugs: Secondary | ICD-10-CM | POA: Diagnosis not present

## 2023-09-30 DIAGNOSIS — L89151 Pressure ulcer of sacral region, stage 1: Secondary | ICD-10-CM | POA: Diagnosis not present

## 2023-09-30 DIAGNOSIS — Z7985 Long-term (current) use of injectable non-insulin antidiabetic drugs: Secondary | ICD-10-CM | POA: Diagnosis not present

## 2023-09-30 DIAGNOSIS — M19011 Primary osteoarthritis, right shoulder: Secondary | ICD-10-CM | POA: Diagnosis not present

## 2023-09-30 DIAGNOSIS — Z9181 History of falling: Secondary | ICD-10-CM | POA: Diagnosis not present

## 2023-09-30 DIAGNOSIS — Z7901 Long term (current) use of anticoagulants: Secondary | ICD-10-CM | POA: Diagnosis not present

## 2023-09-30 DIAGNOSIS — E7800 Pure hypercholesterolemia, unspecified: Secondary | ICD-10-CM | POA: Diagnosis not present

## 2023-09-30 DIAGNOSIS — E119 Type 2 diabetes mellitus without complications: Secondary | ICD-10-CM | POA: Diagnosis not present

## 2023-09-30 DIAGNOSIS — M19012 Primary osteoarthritis, left shoulder: Secondary | ICD-10-CM | POA: Diagnosis not present

## 2023-09-30 DIAGNOSIS — E876 Hypokalemia: Secondary | ICD-10-CM | POA: Diagnosis not present

## 2023-10-02 ENCOUNTER — Encounter (HOSPITAL_COMMUNITY): Payer: Self-pay

## 2023-10-02 ENCOUNTER — Other Ambulatory Visit: Payer: Self-pay

## 2023-10-02 ENCOUNTER — Emergency Department (HOSPITAL_COMMUNITY): Payer: Medicare HMO

## 2023-10-02 ENCOUNTER — Emergency Department (HOSPITAL_COMMUNITY)
Admission: EM | Admit: 2023-10-02 | Discharge: 2023-10-02 | Disposition: A | Discharge status: Home / Self Care | Payer: Medicare HMO | Attending: Emergency Medicine | Admitting: Emergency Medicine

## 2023-10-02 DIAGNOSIS — I509 Heart failure, unspecified: Secondary | ICD-10-CM | POA: Insufficient documentation

## 2023-10-02 DIAGNOSIS — Z794 Long term (current) use of insulin: Secondary | ICD-10-CM | POA: Diagnosis not present

## 2023-10-02 DIAGNOSIS — I10 Essential (primary) hypertension: Secondary | ICD-10-CM | POA: Diagnosis not present

## 2023-10-02 DIAGNOSIS — I2510 Atherosclerotic heart disease of native coronary artery without angina pectoris: Secondary | ICD-10-CM | POA: Insufficient documentation

## 2023-10-02 DIAGNOSIS — Z7901 Long term (current) use of anticoagulants: Secondary | ICD-10-CM | POA: Insufficient documentation

## 2023-10-02 DIAGNOSIS — M25511 Pain in right shoulder: Secondary | ICD-10-CM | POA: Diagnosis not present

## 2023-10-02 DIAGNOSIS — X501XXA Overexertion from prolonged static or awkward postures, initial encounter: Secondary | ICD-10-CM | POA: Diagnosis not present

## 2023-10-02 DIAGNOSIS — E119 Type 2 diabetes mellitus without complications: Secondary | ICD-10-CM | POA: Diagnosis not present

## 2023-10-02 DIAGNOSIS — M19011 Primary osteoarthritis, right shoulder: Secondary | ICD-10-CM | POA: Diagnosis not present

## 2023-10-02 MED ORDER — HYDROCODONE-ACETAMINOPHEN 5-325 MG PO TABS
1.0000 | ORAL_TABLET | Freq: Once | ORAL | Status: AC
  Administered 2023-10-02: 1 via ORAL
  Filled 2023-10-02: qty 1

## 2023-10-02 MED ORDER — LIDOCAINE 5 % EX PTCH
1.0000 | MEDICATED_PATCH | CUTANEOUS | Status: DC
  Administered 2023-10-02: 1 via TRANSDERMAL
  Filled 2023-10-02: qty 1

## 2023-10-02 NOTE — ED Notes (Signed)
Ortho tech at bedside 

## 2023-10-02 NOTE — Progress Notes (Signed)
Orthopedic Tech Progress Note Patient Details:  [name] 09-07-1941 [id number]  Ortho Devices Type of Ortho Device: Sling immobilizer Ortho Device/Splint Location: RUE Ortho Device/Splint Interventions: Ordered, Application, Adjustment   Post Interventions Patient Tolerated: Well Instructions Provided: Care of device  [name] 10/02/2023, 8:46 PM

## 2023-10-02 NOTE — ED Triage Notes (Signed)
Pt c/o right shoulder pain started 30 mins ago. Pt states she has rotator cuff issues and her shoulder is bone on bone. Pt states she goes to PT for it and she called her PT and they told her to come here for the pain. PT has 2+ right radial pulse, warm to touch, able to move hand.

## 2023-10-02 NOTE — ED Notes (Signed)
Ortho tech contacted for sling/immobilizer application

## 2023-10-02 NOTE — ED Provider Notes (Signed)
[place] EMERGENCY DEPARTMENT AT [facility] Provider Note   CSN: [id number] Arrival date & time: 10/02/23  1455     History  Chief Complaint  Patient presents with   Shoulder Pain    [name] is a 82 y.o. female with a past medical history significant for chronic bilateral shoulder pain, history of CVA, CAD, CHF, and DM who presents to the ED due to right shoulder pain that started 30 minutes prior to arrival.  Patient notes she was reaching for a book with her right arm and developed sudden onset of right shoulder pain.  Has chronic bilateral shoulder pain.  History of severe arthritis.  Patient states she heard a "pop".  She notes she believes her shoulder is dislocated.  No previous dislocations.  Denies chest pain.  No fever or chills.  No erythema, edema, or warmth. No direct injury to right shoulder.   History obtained from patient and past medical records. No interpreter used during encounter.       Home Medications Prior to Admission medications   Medication Sig Start Date End Date Taking? Authorizing Provider  acetaminophen (TYLENOL 8 HOUR) 650 MG CR tablet Take 1 tablet (650 mg total) by mouth every 8 (eight) hours as needed for pain. 11/03/19   Garnette Gunner, MD  apixaban (ELIQUIS) 2.5 MG TABS tablet Take 1 tablet (2.5 mg total) by mouth 2 (two) times daily. 04/30/23   Laurey Morale, MD  atorvastatin (LIPITOR) 40 MG tablet TAKE 1 TABLET DAILY 03/07/23   Laurey Morale, MD  Blood Glucose Monitoring Suppl (ONE TOUCH ULTRA 2) w/Device KIT USE TO CHECK BLOOD SUGAR UPTO 3 TIMES A DAY 02/01/21   Maury Dus, MD  carvedilol (COREG) 6.25 MG tablet TAKE 1 TABLET TWICE A DAY  WITH MEALS 07/29/23   Laurey Morale, MD  Cholecalciferol 25 MCG (1000 UT) tablet Take 1,000 Units by mouth daily.    [provider]  diclofenac Sodium (VOLTAREN) 1 % GEL Apply 2 g topically as needed.    [provider]  docusate sodium (COLACE) 100 MG capsule  Take 100 mg by mouth at bedtime.    [provider]  Dulaglutide (TRULICITY) 0.75 MG/0.5ML SOAJ Inject 0.75 mg into the skin once a week. 09/18/23   Evette Georges, MD  empagliflozin (JARDIANCE) 10 MG TABS tablet TAKE 1 TABLET DAILY BEFORE BREAKFAST 05/20/23   Laurey Morale, MD  ferrous sulfate 325 (65 FE) MG tablet TAKE 1 TABLET BY MOUTH EVERY DAY WITH BREAKFAST 09/16/20   Maury Dus, MD  glucose blood (ONE TOUCH ULTRA TEST) test strip Use three times a day 09/15/20   Maury Dus, MD  glucose blood test strip Use as instructed to check blood glucose up to 4x daily 10/10/22   Maury Dus, MD  Lancets [facility] DELICA PLUS [place]) MISC Please use to check blood sugar up to four times daily. 10/10/22   Maury Dus, MD  metolazone (ZAROXOLYN) 2.5 MG tablet Take only as directed by CHF clinic 05/22/21   Laurey Morale, MD  Multiple Vitamins-Minerals (PRESERVISION AREDS 2+MULTI VIT PO) Take 1 capsule by mouth in the morning and at bedtime.    [provider]  nitroGLYCERIN (NITROSTAT) 0.4 MG SL tablet PLACE 1 TABLET (0.4 MG TOTAL) UNDER THE TONGUE EVERY 5 (FIVE) MINUTES AS NEEDED FOR CHEST PAIN (UP TO 3 DOSES). 03/01/21   Laurey Morale, MD  potassium chloride SA (KLOR-CON M) 20 MEQ tablet Take 3 tablets (  60 mEq total) by mouth 2 (two) times daily. 06/12/23   Laurey Morale, MD  rOPINIRole (REQUIP) 0.5 MG tablet Take 2 tablets (1 mg total) by mouth at bedtime. 08/07/23   Elberta Fortis, MD  spironolactone (ALDACTONE) 25 MG tablet TAKE 1/2 TABLET(12.5MG      TOTAL) DAILY 04/16/23   Laurey Morale, MD  torsemide (DEMADEX) 20 MG tablet Take 4 tablets (80 mg total) by mouth 2 (two) times daily. 06/12/23   Laurey Morale, MD  traMADol (ULTRAM) 50 MG tablet Take 1 tablet (50 mg total) by mouth every 6 (six) hours as needed for severe pain (pain score 7-10). 09/23/23 10/23/23  Evette Georges, MD  venlafaxine XR (EFFEXOR XR) 37.5 MG 24 hr capsule Take 1 capsule (37.5 mg total)  by mouth daily with breakfast. 09/04/23   Evette Georges, MD  vitamin B-12 (CYANOCOBALAMIN) 1000 MCG tablet Take 1 tablet (1,000 mcg total) by mouth daily. 09/16/20   Maury Dus, MD  zinc oxide 20 % ointment Apply 1 application topically as needed (buttocks irritation). 10/12/21   Maury Dus, MD      Allergies    Benazepril, Cymbalta [duloxetine hcl], Ozempic (0.25 or 0.5 mg-dose) [semaglutide(0.25 or 0.5mg -dos)], Tape, Angiotensin receptor blockers, Bactrim [sulfamethoxazole-trimethoprim], and Fe-succ-c-thre-b12-des stomach    Review of Systems   Review of Systems  Musculoskeletal:  Positive for arthralgias.    Physical Exam Updated Vital Signs BP 134/83   Pulse 64   Temp 97.6 F (36.4 C)   Resp 19   Ht 5\' 9"  (1.753 m)   Wt 95.2 kg   LMP  (LMP Unknown)   SpO2 100%   BMI 30.99 kg/m  Physical Exam Vitals and nursing note reviewed.  Constitutional:      General: She is not in acute distress.    Appearance: She is not ill-appearing.  HENT:     Head: Normocephalic.  Eyes:     Pupils: Pupils are equal, round, and reactive to light.  Cardiovascular:     Rate and Rhythm: Normal rate and regular rhythm.     Pulses: Normal pulses.     Heart sounds: Normal heart sounds. No murmur heard.    No friction rub. No gallop.  Pulmonary:     Effort: Pulmonary effort is normal.     Breath sounds: Normal breath sounds.  Abdominal:     General: Abdomen is flat. There is no distension.     Palpations: Abdomen is soft.     Tenderness: There is no abdominal tenderness. There is no guarding or rebound.  Musculoskeletal:        General: Normal range of motion.     Cervical back: Neck supple.     Comments: No erythema, edema, or warmth to right shoulder. Decreased ROM, especially overhead motion. Radial pulse intact.   Skin:    General: Skin is warm and dry.  Neurological:     General: No focal deficit present.     Mental Status: She is alert.  Psychiatric:        Mood and Affect:  Mood normal.        Behavior: Behavior normal.     ED Results / Procedures / Treatments   Labs (all labs ordered are listed, but only abnormal results are displayed) Labs Reviewed - No data to display  EKG EKG Interpretation Date/Time:  Wednesday October 02 2023 18:46:27 EST Ventricular Rate:  69 PR Interval:    QRS Duration:  112 QT Interval:  442 QTC Calculation:  474 R Axis:   3  Text Interpretation: Atrial fibrillation Borderline intraventricular conduction delay Borderline repolarization abnormality Confirmed by Coralee Pesa [phone]) on 10/02/2023 7:50:58 PM  Radiology DG Shoulder Right  Result Date: 10/02/2023 CLINICAL DATA:  Right shoulder pain. EXAM: RIGHT SHOULDER - 2+ VIEW COMPARISON:  None Available. FINDINGS: There is no evidence of fracture or dislocation. Severe degenerative changes seen involving right glenohumeral joint. Chronic resorption of distal right clavicle is noted. Soft tissues are unremarkable. IMPRESSION: Severe osteoarthritis of right glenohumeral joint. No acute abnormality seen. Electronically Signed   By: Lupita Raider M.D.   On: 10/02/2023 18:08    Procedures Procedures    Medications Ordered in ED Medications  lidocaine (LIDODERM) 5 % 1 patch (1 patch Transdermal Patch Applied 10/02/23 1849)  HYDROcodone-acetaminophen (NORCO/VICODIN) 5-325 MG per tablet 1 tablet (1 tablet Oral Given 10/02/23 1849)    ED Course/ Medical Decision Making/ A&P                                 Medical Decision Making Amount and/or Complexity of Data Reviewed Independent Historian: caregiver    Details: Son at bedside provided history External Data Reviewed: notes. Radiology: ordered and independent interpretation performed. Decision-making details documented in ED Course.  Risk Prescription drug management.   This patient presents to the ED for concern of right shoulder pain, this involves an extensive number of treatment options, and is a complaint  that carries with it a high risk of complications and morbidity.  The differential diagnosis includes OA, septic joint, bony fracture, dislocation, etc  82 year old female presents to the ED due to acute on chronic right shoulder pain.  Patient states she was reaching for a book and heard a "pop".  Patient concerned about dislocation.  No chest pain or shortness of breath.  Has a history of chronic bilateral shoulder pain.  On tramadol daily.  Upon arrival, stable vitals.  Patient in no acute distress.  Decreased range of motion of right shoulder.  Right upper extremity neurovascularly intact.  Low suspicion for compartment syndrome.  No erythema, edema, or warmth.  Low suspicion for septic joint.  X-ray ordered at triage which I personally reviewed and interpreted which demonstrates severe arthritis.  No dislocations.  Suspect symptoms likely related to arthritis.  Patient treated with pain medication here in the ED.  8:03 PM reassessed patient at bedside.  Patient admits to some improvement in pain after hydrocodone.  Has slightly increased range of motion of right shoulder.  No evidence of infection on exam.  Patient placed in sling for comfort.  Advised patient to continue taking her tramadol as previously prescribed.  Follow-up with Delbert Harness for further evaluation. EKG demonstrates A. Fib. No evidence of acute ischemia. Low suspicion for atypical ACS. Patient stable for discharge. Strict ED precautions discussed with patient. Patient states understanding and agrees to plan. Patient discharged home in no acute distress and stable vitals  Co morbidities that complicate the patient evaluation  Chronic shoulder pain Cardiac Monitoring: / EKG:  The patient was maintained on a cardiac monitor.  I personally viewed and interpreted the cardiac monitored which showed an underlying rhythm of: A. Fib  Social Determinants of Health:  elderly  Discussed with Dr. Wilkie Aye who evaluated patient at bedside  and agrees with assessment and plan.        Final Clinical Impression(s) / ED Diagnoses Final diagnoses:  Acute pain of  right shoulder    Rx / DC Orders ED Discharge Orders     None         Mannie Stabile, PA-C 10/02/23 2028    Rozelle Logan, DO 10/05/23 1323

## 2023-10-02 NOTE — Discharge Instructions (Signed)
It was a pleasure taking care of you today.  As discussed, your x-ray showed severe arthritis.  No dislocations.  Please follow-up with your orthopedic surgeon for further evaluation.  Continue to take your tramadol as previously prescribed.  Return to the ER for any worsening symptoms.

## 2023-10-04 DIAGNOSIS — Z7901 Long term (current) use of anticoagulants: Secondary | ICD-10-CM | POA: Diagnosis not present

## 2023-10-04 DIAGNOSIS — L89151 Pressure ulcer of sacral region, stage 1: Secondary | ICD-10-CM | POA: Diagnosis not present

## 2023-10-04 DIAGNOSIS — D649 Anemia, unspecified: Secondary | ICD-10-CM | POA: Diagnosis not present

## 2023-10-04 DIAGNOSIS — M19011 Primary osteoarthritis, right shoulder: Secondary | ICD-10-CM | POA: Diagnosis not present

## 2023-10-04 DIAGNOSIS — Z7984 Long term (current) use of oral hypoglycemic drugs: Secondary | ICD-10-CM | POA: Diagnosis not present

## 2023-10-04 DIAGNOSIS — F32A Depression, unspecified: Secondary | ICD-10-CM | POA: Diagnosis not present

## 2023-10-04 DIAGNOSIS — Z7985 Long-term (current) use of injectable non-insulin antidiabetic drugs: Secondary | ICD-10-CM | POA: Diagnosis not present

## 2023-10-04 DIAGNOSIS — I519 Heart disease, unspecified: Secondary | ICD-10-CM | POA: Diagnosis not present

## 2023-10-04 DIAGNOSIS — M19012 Primary osteoarthritis, left shoulder: Secondary | ICD-10-CM | POA: Diagnosis not present

## 2023-10-04 DIAGNOSIS — E876 Hypokalemia: Secondary | ICD-10-CM | POA: Diagnosis not present

## 2023-10-04 DIAGNOSIS — E7800 Pure hypercholesterolemia, unspecified: Secondary | ICD-10-CM | POA: Diagnosis not present

## 2023-10-04 DIAGNOSIS — E119 Type 2 diabetes mellitus without complications: Secondary | ICD-10-CM | POA: Diagnosis not present

## 2023-10-04 DIAGNOSIS — Z9181 History of falling: Secondary | ICD-10-CM | POA: Diagnosis not present

## 2023-10-05 DIAGNOSIS — I519 Heart disease, unspecified: Secondary | ICD-10-CM | POA: Diagnosis not present

## 2023-10-05 DIAGNOSIS — E7800 Pure hypercholesterolemia, unspecified: Secondary | ICD-10-CM | POA: Diagnosis not present

## 2023-10-05 DIAGNOSIS — Z7901 Long term (current) use of anticoagulants: Secondary | ICD-10-CM | POA: Diagnosis not present

## 2023-10-05 DIAGNOSIS — L89151 Pressure ulcer of sacral region, stage 1: Secondary | ICD-10-CM | POA: Diagnosis not present

## 2023-10-05 DIAGNOSIS — Z7985 Long-term (current) use of injectable non-insulin antidiabetic drugs: Secondary | ICD-10-CM | POA: Diagnosis not present

## 2023-10-05 DIAGNOSIS — D649 Anemia, unspecified: Secondary | ICD-10-CM | POA: Diagnosis not present

## 2023-10-05 DIAGNOSIS — E876 Hypokalemia: Secondary | ICD-10-CM | POA: Diagnosis not present

## 2023-10-05 DIAGNOSIS — Z9181 History of falling: Secondary | ICD-10-CM | POA: Diagnosis not present

## 2023-10-05 DIAGNOSIS — F32A Depression, unspecified: Secondary | ICD-10-CM | POA: Diagnosis not present

## 2023-10-05 DIAGNOSIS — E119 Type 2 diabetes mellitus without complications: Secondary | ICD-10-CM | POA: Diagnosis not present

## 2023-10-05 DIAGNOSIS — M19012 Primary osteoarthritis, left shoulder: Secondary | ICD-10-CM | POA: Diagnosis not present

## 2023-10-05 DIAGNOSIS — M19011 Primary osteoarthritis, right shoulder: Secondary | ICD-10-CM | POA: Diagnosis not present

## 2023-10-05 DIAGNOSIS — Z7984 Long term (current) use of oral hypoglycemic drugs: Secondary | ICD-10-CM | POA: Diagnosis not present

## 2023-10-07 ENCOUNTER — Telehealth: Payer: Self-pay

## 2023-10-07 NOTE — Telephone Encounter (Signed)
Patient calls nurse line in regards to appointment tomorrow.   ED visit on 10/02/23. She was seen for right sided shoulder pain.   She reports that she was recommended to discuss receiving a "gel type shot" for shoulder pain. She states that this would need to be authorized by [employer].   Patient reports that she will discuss pain management options further with provider at visit tomorrow.   FYI to PCP.   Veronda Prude, RN

## 2023-10-08 ENCOUNTER — Encounter: Payer: Self-pay | Admitting: Family Medicine

## 2023-10-08 ENCOUNTER — Telehealth: Payer: Self-pay

## 2023-10-08 ENCOUNTER — Ambulatory Visit (INDEPENDENT_AMBULATORY_CARE_PROVIDER_SITE_OTHER): Payer: Medicare HMO | Admitting: Family Medicine

## 2023-10-08 VITALS — BP 87/68 | HR 64 | Ht 69.0 in

## 2023-10-08 DIAGNOSIS — I10 Essential (primary) hypertension: Secondary | ICD-10-CM | POA: Diagnosis not present

## 2023-10-08 DIAGNOSIS — M19011 Primary osteoarthritis, right shoulder: Secondary | ICD-10-CM

## 2023-10-08 DIAGNOSIS — L98429 Non-pressure chronic ulcer of back with unspecified severity: Secondary | ICD-10-CM

## 2023-10-08 DIAGNOSIS — M19012 Primary osteoarthritis, left shoulder: Secondary | ICD-10-CM

## 2023-10-08 DIAGNOSIS — E1142 Type 2 diabetes mellitus with diabetic polyneuropathy: Secondary | ICD-10-CM

## 2023-10-08 LAB — POCT GLYCOSYLATED HEMOGLOBIN (HGB A1C): HbA1c, POC (controlled diabetic range): 6.4 % (ref 0.0–7.0)

## 2023-10-08 MED ORDER — TRAMADOL HCL 50 MG PO TABS: 50.0000 mg | ORAL_TABLET | Freq: Four times a day (QID) | ORAL | 0 refills | Status: AC | PRN

## 2023-10-08 MED ORDER — VENLAFAXINE HCL ER 37.5 MG PO CP24: 37.5000 mg | ORAL_CAPSULE | Freq: Every day | ORAL | 0 refills | Status: DC

## 2023-10-08 NOTE — Assessment & Plan Note (Signed)
Persistent, though tramadol helps.  Continue current management; will refill today.  She also has a follow-up with orthopedist tomorrow for discussion of Orthovisc injections.

## 2023-10-08 NOTE — Progress Notes (Signed)
    SUBJECTIVE:   CHIEF COMPLAINT / HPI:   Bilateral shoulder OA Pain is persistent, though the tramadol helps a lot.  She went to the ED on 11/20 after she felt a pop; EDP felt she may have dislocated it but relocated on her own.  At that time, they mention the possibility of gel injections.  She would like to pursue this if possible with her orthopedist.  She already has a follow-up with them tomorrow, 11/27.  Possible sacral ulcer Has a pain at the top of her buttocks that has been going on for a while now.  She has a wound care nurse that comes out and helps her apply creams.  However, the pain is worsening.  Type 2 diabetes Would like to have her A1c rechecked today.  OBJECTIVE:   BP (!) 87/68   Pulse 64   Ht 5\' 9"  (1.753 m)   LMP  (LMP Unknown)   SpO2 100%   BMI 30.99 kg/m   General: Alert and oriented, in NAD Skin: Warm, dry, and intact; small sinus opening at superior gluteal cleft without active purulence, erythema, or swelling though area covered in white-cream HEENT: NCAT, EOM grossly normal, midline nasal septum Cardiac: RRR, no m/r/g appreciated Respiratory: CTAB, breathing and speaking comfortably on RA Extremities: Severely limited range of motion of bilateral shoulders Neurological: No gross focal deficit Psychiatric: Appropriate mood and affect   ASSESSMENT/PLAN:   Osteoarthritis of both shoulders Persistent, though tramadol helps.  Continue current management; will refill today.  She also has a follow-up with orthopedist tomorrow for discussion of Orthovisc injections.  Stage 1 skin ulcer of sacral region [facility]) Area is likely pilonidal sinus with possible evolving stage I skin ulcer of the sacral region.  Provided with pressure relief pads today.  Spoke with CMA who will reach out to home health nurse to instruct to use these sacral pressure dressings.  Can consider antibiotics in the future if sinus/ulcer gets infected.  Continue tramadol and weight  distribution and assess at next visit.  DM type 2 with diabetic peripheral neuropathy (HCC) A1c stable at 6.4.  Continue Jardiance and Trulicity.  Hypertension BP previously at goal.  Today BP fairly low but unfortunately not rechecked.  Patient asymptomatic and was mobilizing at her baseline with me in the room.  Follow-up at next visit.   Janeal Holmes, MD [facility]

## 2023-10-08 NOTE — Assessment & Plan Note (Signed)
BP previously at goal.  Today BP fairly low but unfortunately not rechecked.  Patient asymptomatic and was mobilizing at her baseline with me in the room.  Follow-up at next visit.

## 2023-10-08 NOTE — Assessment & Plan Note (Signed)
Area is likely pilonidal sinus with possible evolving stage I skin ulcer of the sacral region.  Provided with pressure relief pads today.  Spoke with CMA who will reach out to home health nurse to instruct to use these sacral pressure dressings.  Can consider antibiotics in the future if sinus/ulcer gets infected.  Continue tramadol and weight distribution and assess at next visit.

## 2023-10-08 NOTE — Assessment & Plan Note (Signed)
A1c stable at 6.4.  Continue Jardiance and Trulicity.

## 2023-10-08 NOTE — Patient Instructions (Signed)
Follow-up with the orthopedist about the gel injections tomorrow. I have refilled your tramadol and venlafaxine. You have a pilonidal sinus with possible evolving stage I sacral ulcer.  I have given you some sacral pressure dressings.  I will place an order so that your home health nurse can get these and apply these for you.  Let me know if it is worsening.

## 2023-10-08 NOTE — Assessment & Plan Note (Signed)
>>  ASSESSMENT AND PLAN FOR STAGE 1 SKIN ULCER OF SACRAL REGION (HCC) WRITTEN ON 10/08/2023 12:42 PM BY Kimley Apsey, MD  Area is likely pilonidal sinus with possible evolving stage I skin ulcer of the sacral region.  Provided with pressure relief pads today.  Spoke with CMA who will reach out to home health nurse to instruct to use these sacral pressure dressings.  Can consider antibiotics in the future if sinus/ulcer gets infected.  Continue tramadol and weight distribution and assess at next visit.

## 2023-10-08 NOTE — Telephone Encounter (Signed)
Spoke with the patient nurse [place] she stated that they tried the sacral pressure pads and they wouldn't stay on but she stated that she will try it out again and let us know if they work or not.

## 2023-10-08 NOTE — Telephone Encounter (Signed)
Patient calls nurse line requesting to speak with Mabe.   She reports she was told to call him with a "phone number."   She reports her OT "[place]" with [facility] phone number is [phone].  Will forward to Mabe.

## 2023-10-09 DIAGNOSIS — S46011D Strain of muscle(s) and tendon(s) of the rotator cuff of right shoulder, subsequent encounter: Secondary | ICD-10-CM | POA: Diagnosis not present

## 2023-10-11 DIAGNOSIS — Z7901 Long term (current) use of anticoagulants: Secondary | ICD-10-CM | POA: Diagnosis not present

## 2023-10-11 DIAGNOSIS — E7800 Pure hypercholesterolemia, unspecified: Secondary | ICD-10-CM | POA: Diagnosis not present

## 2023-10-11 DIAGNOSIS — D649 Anemia, unspecified: Secondary | ICD-10-CM | POA: Diagnosis not present

## 2023-10-11 DIAGNOSIS — Z9181 History of falling: Secondary | ICD-10-CM | POA: Diagnosis not present

## 2023-10-11 DIAGNOSIS — F32A Depression, unspecified: Secondary | ICD-10-CM | POA: Diagnosis not present

## 2023-10-11 DIAGNOSIS — Z7985 Long-term (current) use of injectable non-insulin antidiabetic drugs: Secondary | ICD-10-CM | POA: Diagnosis not present

## 2023-10-11 DIAGNOSIS — I519 Heart disease, unspecified: Secondary | ICD-10-CM | POA: Diagnosis not present

## 2023-10-11 DIAGNOSIS — E876 Hypokalemia: Secondary | ICD-10-CM | POA: Diagnosis not present

## 2023-10-11 DIAGNOSIS — M19011 Primary osteoarthritis, right shoulder: Secondary | ICD-10-CM | POA: Diagnosis not present

## 2023-10-11 DIAGNOSIS — Z7984 Long term (current) use of oral hypoglycemic drugs: Secondary | ICD-10-CM | POA: Diagnosis not present

## 2023-10-11 DIAGNOSIS — L89151 Pressure ulcer of sacral region, stage 1: Secondary | ICD-10-CM | POA: Diagnosis not present

## 2023-10-11 DIAGNOSIS — M19012 Primary osteoarthritis, left shoulder: Secondary | ICD-10-CM | POA: Diagnosis not present

## 2023-10-11 DIAGNOSIS — E119 Type 2 diabetes mellitus without complications: Secondary | ICD-10-CM | POA: Diagnosis not present

## 2023-10-14 DIAGNOSIS — E119 Type 2 diabetes mellitus without complications: Secondary | ICD-10-CM | POA: Diagnosis not present

## 2023-10-14 DIAGNOSIS — I519 Heart disease, unspecified: Secondary | ICD-10-CM | POA: Diagnosis not present

## 2023-10-14 DIAGNOSIS — M19012 Primary osteoarthritis, left shoulder: Secondary | ICD-10-CM | POA: Diagnosis not present

## 2023-10-14 DIAGNOSIS — D649 Anemia, unspecified: Secondary | ICD-10-CM | POA: Diagnosis not present

## 2023-10-14 DIAGNOSIS — L89151 Pressure ulcer of sacral region, stage 1: Secondary | ICD-10-CM | POA: Diagnosis not present

## 2023-10-14 DIAGNOSIS — Z7984 Long term (current) use of oral hypoglycemic drugs: Secondary | ICD-10-CM | POA: Diagnosis not present

## 2023-10-14 DIAGNOSIS — F32A Depression, unspecified: Secondary | ICD-10-CM | POA: Diagnosis not present

## 2023-10-14 DIAGNOSIS — Z9181 History of falling: Secondary | ICD-10-CM | POA: Diagnosis not present

## 2023-10-14 DIAGNOSIS — Z7901 Long term (current) use of anticoagulants: Secondary | ICD-10-CM | POA: Diagnosis not present

## 2023-10-14 DIAGNOSIS — Z7985 Long-term (current) use of injectable non-insulin antidiabetic drugs: Secondary | ICD-10-CM | POA: Diagnosis not present

## 2023-10-14 DIAGNOSIS — M19011 Primary osteoarthritis, right shoulder: Secondary | ICD-10-CM | POA: Diagnosis not present

## 2023-10-14 DIAGNOSIS — E876 Hypokalemia: Secondary | ICD-10-CM | POA: Diagnosis not present

## 2023-10-14 DIAGNOSIS — E7800 Pure hypercholesterolemia, unspecified: Secondary | ICD-10-CM | POA: Diagnosis not present

## 2023-10-15 ENCOUNTER — Telehealth: Payer: Self-pay

## 2023-10-15 NOTE — Telephone Encounter (Signed)
Patient calls nurse line requesting to speak with Mabe.   She reports she saw Dr. Lorretta Harp and he stated shoulder injections will not be an option for her.  She reports he stated the FDA does not approve those injections.   Advised will forward to PCP.

## 2023-10-16 DIAGNOSIS — L89151 Pressure ulcer of sacral region, stage 1: Secondary | ICD-10-CM | POA: Diagnosis not present

## 2023-10-16 DIAGNOSIS — E7800 Pure hypercholesterolemia, unspecified: Secondary | ICD-10-CM | POA: Diagnosis not present

## 2023-10-16 DIAGNOSIS — Z7985 Long-term (current) use of injectable non-insulin antidiabetic drugs: Secondary | ICD-10-CM | POA: Diagnosis not present

## 2023-10-16 DIAGNOSIS — M19012 Primary osteoarthritis, left shoulder: Secondary | ICD-10-CM | POA: Diagnosis not present

## 2023-10-16 DIAGNOSIS — Z9181 History of falling: Secondary | ICD-10-CM | POA: Diagnosis not present

## 2023-10-16 DIAGNOSIS — Z7901 Long term (current) use of anticoagulants: Secondary | ICD-10-CM | POA: Diagnosis not present

## 2023-10-16 DIAGNOSIS — Z7984 Long term (current) use of oral hypoglycemic drugs: Secondary | ICD-10-CM | POA: Diagnosis not present

## 2023-10-16 DIAGNOSIS — E119 Type 2 diabetes mellitus without complications: Secondary | ICD-10-CM | POA: Diagnosis not present

## 2023-10-16 DIAGNOSIS — M19011 Primary osteoarthritis, right shoulder: Secondary | ICD-10-CM | POA: Diagnosis not present

## 2023-10-16 DIAGNOSIS — F32A Depression, unspecified: Secondary | ICD-10-CM | POA: Diagnosis not present

## 2023-10-16 DIAGNOSIS — D649 Anemia, unspecified: Secondary | ICD-10-CM | POA: Diagnosis not present

## 2023-10-16 DIAGNOSIS — I519 Heart disease, unspecified: Secondary | ICD-10-CM | POA: Diagnosis not present

## 2023-10-16 DIAGNOSIS — E876 Hypokalemia: Secondary | ICD-10-CM | POA: Diagnosis not present

## 2023-10-16 NOTE — Telephone Encounter (Signed)
Discussed injections with patient. She states she is going to seek a second opinion for further OA options once she is able to get in touch with her insurance. She states tramadol continues to help greatly. She is continuing occupational therapy.  Also discussed dry mouth with venlafaxine 37.5 mg daily. This was prescribed after switching from duloxetine for neuropathic pain. Since tramadol has helped, and since most of her pain is arthritic in nature, believe we can stop this. Given age and risk of withdrawal, instructed to taper medication by taking every other day for the next week and then stopping all together. Can reassess pain levels at next visit.

## 2023-10-17 ENCOUNTER — Telehealth: Payer: Self-pay

## 2023-10-17 NOTE — Telephone Encounter (Signed)
Moira from [employer] calling for OT verbal orders as follows:  1 time(s) weekly for 4 week(s).   Verbal orders given per [facility] protocol  Veronda Prude, RN

## 2023-10-22 ENCOUNTER — Other Ambulatory Visit: Payer: Medicare HMO | Admitting: *Deleted

## 2023-10-22 DIAGNOSIS — Z7901 Long term (current) use of anticoagulants: Secondary | ICD-10-CM | POA: Diagnosis not present

## 2023-10-22 DIAGNOSIS — E119 Type 2 diabetes mellitus without complications: Secondary | ICD-10-CM | POA: Diagnosis not present

## 2023-10-22 DIAGNOSIS — Z7984 Long term (current) use of oral hypoglycemic drugs: Secondary | ICD-10-CM | POA: Diagnosis not present

## 2023-10-22 DIAGNOSIS — L89151 Pressure ulcer of sacral region, stage 1: Secondary | ICD-10-CM | POA: Diagnosis not present

## 2023-10-22 DIAGNOSIS — M19011 Primary osteoarthritis, right shoulder: Secondary | ICD-10-CM | POA: Diagnosis not present

## 2023-10-22 DIAGNOSIS — E876 Hypokalemia: Secondary | ICD-10-CM | POA: Diagnosis not present

## 2023-10-22 DIAGNOSIS — M19012 Primary osteoarthritis, left shoulder: Secondary | ICD-10-CM | POA: Diagnosis not present

## 2023-10-22 DIAGNOSIS — Z7985 Long-term (current) use of injectable non-insulin antidiabetic drugs: Secondary | ICD-10-CM | POA: Diagnosis not present

## 2023-10-22 DIAGNOSIS — Z9181 History of falling: Secondary | ICD-10-CM | POA: Diagnosis not present

## 2023-10-22 DIAGNOSIS — D649 Anemia, unspecified: Secondary | ICD-10-CM | POA: Diagnosis not present

## 2023-10-22 DIAGNOSIS — F32A Depression, unspecified: Secondary | ICD-10-CM | POA: Diagnosis not present

## 2023-10-22 DIAGNOSIS — E7800 Pure hypercholesterolemia, unspecified: Secondary | ICD-10-CM | POA: Diagnosis not present

## 2023-10-22 DIAGNOSIS — I519 Heart disease, unspecified: Secondary | ICD-10-CM | POA: Diagnosis not present

## 2023-10-23 ENCOUNTER — Other Ambulatory Visit: Payer: Self-pay | Admitting: *Deleted

## 2023-10-23 NOTE — Patient Instructions (Signed)
Visit Information  Thank you for taking time to visit with me today. Please don't hesitate to contact me if I can be of assistance to you before our next scheduled telephone appointment.  Following are the goals we discussed today:   Goals Addressed             This Visit's Progress    RNCM Care Management Expected Outcome: Monitor, Self-Manage and Reduce Symptoms of: Hypertension, DM, Osteoporosis       Current Barriers:  Knowledge Deficits related to plan of care for management of HTN, DMII, and Osteoporosis  Chronic Disease Management support and education needs related to HTN, DMII, and Osteoporosis   RNCM Clinical Goal(s):  Patient will verbalize basic understanding of  HTN, DMII, and Osteoporosis disease process and self health management plan as evidenced by verbal explanation, recognizing symptoms, lifestyle modifications to include maintaining a heart healthy/diabetic diet, remaining free of falls and continuing home exercises provided by PT as tolerated take all medications exactly as prescribed and will call provider for medication related questions as evidenced by compliance to all medications and monitoring for any side effects and notifying primary care provider, if any should arise.  attend all scheduled medical appointments: with primary care provider and specialist as evidenced by keeping all scheduled appointments demonstrate Improved and Ongoing adherence to prescribed treatment plan for HTN, DMII, and Osteoporosis as evidenced by consistent medication compliance, symptom monitoring, continued self-managment continue to work with RN Care Manager to address care management and care coordination needs related to  HTN, DMII, and Osteoporosis as evidenced by adherence to CM Team Scheduled appointments through collaboration with RN Care manager, provider, and care team.   Interventions: Evaluation of current treatment plan related to  self management and patient's adherence to  plan as established by provider   Diabetes Interventions:  (Status:  Goal on track:  Yes.) Long Term Goal Assessed patient's understanding of A1c goal: <8% Provided education to patient about basic DM disease process. Denies any hypoglycemic or hyperglycemic episodes. Fasting blood sugar this morning was 114. Highest: 140 Lowest: 95. She states she ranges in the 110s daily.  Reviewed medications with patient and discussed importance of medication adherence. Reports compliance with Jardiance and Trulicity Counseled on importance of regular laboratory monitoring as prescribed Discussed plans with patient for ongoing care management follow up and provided patient with direct contact information for care management team Reviewed scheduled/upcoming provider appointments including: Diabetic Foot Exam with Podiatry on 11-20-2023 Review of patient status, including review of consultants reports, relevant laboratory and other test results, and medications completed Screening for signs and symptoms of depression related to chronic disease state  Assessed social determinant of health barriers Lab Results  Component Value Date   HGBA1C 6.4 10/08/2023    Hypertension Interventions:  (Status:  New goal. and Goal on track:  Yes.) Long Term Goal Last practice recorded BP readings:  BP Readings from Last 3 Encounters:  10/08/23 (!) 87/68  10/02/23 (!) 143/77  09/17/23 (!) 120/54   Most recent eGFR/CrCl:  Lab Results  Component Value Date   EGFR 25 (L) 07/04/2023    No components found for: "CRCL"  Evaluation of current treatment plan related to hypertension self management and patient's adherence to plan as established by provider. Does not regularly check blood pressure but states it was check by the home health RN on 12-10: BP 118/80 Pulse 80. RNCM advised to check blood pressure regularly and keep log. Patient states she will have the  PCA assist her when she visits on MWF. Provided education to  patient re: stroke prevention, s/s of heart attack and stroke Reviewed medications with patient and discussed importance of compliance Discussed plans with patient for ongoing care management follow up and provided patient with direct contact information for care management team Advised patient, providing education and rationale, to monitor blood pressure daily and record, calling PCP for findings outside established parameters Discussed complications of poorly controlled blood pressure such as heart disease, stroke, circulatory complications, vision complications, kidney impairment, sexual dysfunction Weighs 3x/week. States she was 216 today.  Osteoporosis Interventions:  (Status:  New goal. and Goal on track:  Yes.) Short Term Goal Pain assessment performed. Reports pain today but states tramadol is effective. Recent ER visit on 11-20 with recommendations to follow up with Ortho for injection. Per patient, she continues to not be a candidate for any alternative treatments. She also reports that she has an open area to her sacrum that her PCP is calling a cyst. She reports that she has made an appointment with the wound clinic but the earliest she was able to get in was 01-02-2024. She reports that she was able to get a cushion to help offload some of the weight to that area and she reports that she is up frequently due to her use of diuretics. RNCM advised patient to notify her PCP if she starts to notice is signs of symptoms of infection such as redness, tenderness, warm to touch or drainage with foul odor. Patient verbalized full understanding.  Medications reviewed. Continue taking tramadol as prescribed and follow MD recommendations Reviewed provider established plan for pain management Discussed importance of adherence to all scheduled medical appointments Counseled on the importance of reporting any/all new or changed pain symptoms or management strategies to pain management provider Advised  patient to report to care team affect of pain on daily activities Discussed use of relaxation techniques and/or diversional activities to assist with pain reduction (distraction, imagery, relaxation, massage, acupressure, TENS, heat, and cold application  Patient Goals/Self-Care Activities: Take all medications as prescribed Attend all scheduled provider appointments Call pharmacy for medication refills 3-7 days in advance of running out of medications Call provider office for new concerns or questions  keep appointment with eye doctor check blood sugar at prescribed times: once daily and when you have symptoms of low or high blood sugar check feet daily for cuts, sores or redness enter blood sugar readings and medication or insulin into daily log keep a blood pressure log call doctor for signs and symptoms of high blood pressure take medications for blood pressure exactly as prescribed report new symptoms to your doctor  Follow Up Plan:  Telephone follow up appointment with care management team member scheduled for:  11-27-2023 at 2:30 pm           Our next appointment is by telephone on 11-27-2023 at 2:30 pm  Please call the care guide team at [phone] if you need to cancel or reschedule your appointment.   If you are experiencing a Mental Health or Behavioral Health Crisis or need someone to talk to, please call the Suicide and Crisis Lifeline: 988 call the [place] National Suicide Prevention Lifeline: (315) 439-4902 or TTY: (423)421-6579 TTY 279-706-5238) to talk to a trained counselor call 1-800-273-TALK (toll free, 24 hour hotline) call 911   The patient verbalized understanding of instructions, educational materials, and care plan provided today and DECLINED offer to receive copy of patient instructions, educational materials, and care  plan.   Telephone follow up appointment with care management team member scheduled for:11-27-2023 at 2:30 pm  Danise Edge, BSN RN RN Care  Manager  [facility]  Ambulatory Care Management  Direct Number: [phone]

## 2023-10-23 NOTE — Patient Outreach (Signed)
Care Management   Visit Note  10/23/2023 Name: [name] MRN: [id number] DOB: 18-May-1941  Subjective: [name] is a 82 y.o. year old female who is a primary care patient of Evette Georges, MD. The Care Management team was consulted for assistance.      Engaged with patient spoke with patient by telephone.    Goals Addressed             This Visit's Progress    RNCM Care Management Expected Outcome: Monitor, Self-Manage and Reduce Symptoms of: Hypertension, DM, Osteoporosis       Current Barriers:  Knowledge Deficits related to plan of care for management of HTN, DMII, and Osteoporosis  Chronic Disease Management support and education needs related to HTN, DMII, and Osteoporosis   RNCM Clinical Goal(s):  Patient will verbalize basic understanding of  HTN, DMII, and Osteoporosis disease process and self health management plan as evidenced by verbal explanation, recognizing symptoms, lifestyle modifications to include maintaining a heart healthy/diabetic diet, remaining free of falls and continuing home exercises provided by PT as tolerated take all medications exactly as prescribed and will call provider for medication related questions as evidenced by compliance to all medications and monitoring for any side effects and notifying primary care provider, if any should arise.  attend all scheduled medical appointments: with primary care provider and specialist as evidenced by keeping all scheduled appointments demonstrate Improved and Ongoing adherence to prescribed treatment plan for HTN, DMII, and Osteoporosis as evidenced by consistent medication compliance, symptom monitoring, continued self-managment continue to work with RN Care Manager to address care management and care coordination needs related to  HTN, DMII, and Osteoporosis as evidenced by adherence to CM Team Scheduled appointments through collaboration with RN Care manager, provider, and care team.    Interventions: Evaluation of current treatment plan related to  self management and patient's adherence to plan as established by provider   Diabetes Interventions:  (Status:  Goal on track:  Yes.) Long Term Goal Assessed patient's understanding of A1c goal: <8% Provided education to patient about basic DM disease process. Denies any hypoglycemic or hyperglycemic episodes. Fasting blood sugar this morning was 114. Highest: 140 Lowest: 95. She states she ranges in the 110s daily.  Reviewed medications with patient and discussed importance of medication adherence. Reports compliance with Jardiance and Trulicity Counseled on importance of regular laboratory monitoring as prescribed Discussed plans with patient for ongoing care management follow up and provided patient with direct contact information for care management team Reviewed scheduled/upcoming provider appointments including: Diabetic Foot Exam with Podiatry on 11-20-2023 Review of patient status, including review of consultants reports, relevant laboratory and other test results, and medications completed Screening for signs and symptoms of depression related to chronic disease state  Assessed social determinant of health barriers Lab Results  Component Value Date   HGBA1C 6.4 10/08/2023    Hypertension Interventions:  (Status:  New goal. and Goal on track:  Yes.) Long Term Goal Last practice recorded BP readings:  BP Readings from Last 3 Encounters:  10/08/23 (!) 87/68  10/02/23 (!) 143/77  09/17/23 (!) 120/54   Most recent eGFR/CrCl:  Lab Results  Component Value Date   EGFR 25 (L) 07/04/2023    No components found for: "CRCL"  Evaluation of current treatment plan related to hypertension self management and patient's adherence to plan as established by provider. Does not regularly check blood pressure but states it was check by the home health RN on 12-10: BP  118/80 Pulse 80. RNCM advised to check blood pressure regularly  and keep log. Patient states she will have the PCA assist her when she visits on MWF. Provided education to patient re: stroke prevention, s/s of heart attack and stroke Reviewed medications with patient and discussed importance of compliance Discussed plans with patient for ongoing care management follow up and provided patient with direct contact information for care management team Advised patient, providing education and rationale, to monitor blood pressure daily and record, calling PCP for findings outside established parameters Discussed complications of poorly controlled blood pressure such as heart disease, stroke, circulatory complications, vision complications, kidney impairment, sexual dysfunction Weighs 3x/week. States she was 216 today.  Osteoporosis Interventions:  (Status:  New goal. and Goal on track:  Yes.) Short Term Goal Pain assessment performed. Reports pain today but states tramadol is effective. Recent ER visit on 11-20 with recommendations to follow up with Ortho for injection. Per patient, she continues to not be a candidate for any alternative treatments. She also reports that she has an open area to her sacrum that her PCP is calling a cyst. She reports that she has made an appointment with the wound clinic but the earliest she was able to get in was 01-02-2024. She reports that she was able to get a cushion to help offload some of the weight to that area and she reports that she is up frequently due to her use of diuretics. RNCM advised patient to notify her PCP if she starts to notice is signs of symptoms of infection such as redness, tenderness, warm to touch or drainage with foul odor. Patient verbalized full understanding.  Medications reviewed. Continue taking tramadol as prescribed and follow MD recommendations Reviewed provider established plan for pain management Discussed importance of adherence to all scheduled medical appointments Counseled on the importance of  reporting any/all new or changed pain symptoms or management strategies to pain management provider Advised patient to report to care team affect of pain on daily activities Discussed use of relaxation techniques and/or diversional activities to assist with pain reduction (distraction, imagery, relaxation, massage, acupressure, TENS, heat, and cold application  Patient Goals/Self-Care Activities: Take all medications as prescribed Attend all scheduled provider appointments Call pharmacy for medication refills 3-7 days in advance of running out of medications Call provider office for new concerns or questions  keep appointment with eye doctor check blood sugar at prescribed times: once daily and when you have symptoms of low or high blood sugar check feet daily for cuts, sores or redness enter blood sugar readings and medication or insulin into daily log keep a blood pressure log call doctor for signs and symptoms of high blood pressure take medications for blood pressure exactly as prescribed report new symptoms to your doctor  Follow Up Plan:  Telephone follow up appointment with care management team member scheduled for:  11-27-2023 at 2:30 pm           Consent to Services:  Patient was given information about care management services, agreed to services, and gave verbal consent to participate.   Plan: Telephone follow up appointment with care management team member scheduled for:11-27-2023 at 2:30 pm  Danise Edge, BSN RN RN Care Manager  [facility]  Ambulatory Care Management  Direct Number: [phone]

## 2023-10-24 DIAGNOSIS — M19012 Primary osteoarthritis, left shoulder: Secondary | ICD-10-CM | POA: Diagnosis not present

## 2023-10-24 DIAGNOSIS — F32A Depression, unspecified: Secondary | ICD-10-CM | POA: Diagnosis not present

## 2023-10-24 DIAGNOSIS — D649 Anemia, unspecified: Secondary | ICD-10-CM | POA: Diagnosis not present

## 2023-10-24 DIAGNOSIS — E876 Hypokalemia: Secondary | ICD-10-CM | POA: Diagnosis not present

## 2023-10-24 DIAGNOSIS — Z7985 Long-term (current) use of injectable non-insulin antidiabetic drugs: Secondary | ICD-10-CM | POA: Diagnosis not present

## 2023-10-24 DIAGNOSIS — E7800 Pure hypercholesterolemia, unspecified: Secondary | ICD-10-CM | POA: Diagnosis not present

## 2023-10-24 DIAGNOSIS — Z9181 History of falling: Secondary | ICD-10-CM | POA: Diagnosis not present

## 2023-10-24 DIAGNOSIS — Z7984 Long term (current) use of oral hypoglycemic drugs: Secondary | ICD-10-CM | POA: Diagnosis not present

## 2023-10-24 DIAGNOSIS — I519 Heart disease, unspecified: Secondary | ICD-10-CM | POA: Diagnosis not present

## 2023-10-24 DIAGNOSIS — L89151 Pressure ulcer of sacral region, stage 1: Secondary | ICD-10-CM | POA: Diagnosis not present

## 2023-10-24 DIAGNOSIS — M19011 Primary osteoarthritis, right shoulder: Secondary | ICD-10-CM | POA: Diagnosis not present

## 2023-10-24 DIAGNOSIS — Z7901 Long term (current) use of anticoagulants: Secondary | ICD-10-CM | POA: Diagnosis not present

## 2023-10-24 DIAGNOSIS — E119 Type 2 diabetes mellitus without complications: Secondary | ICD-10-CM | POA: Diagnosis not present

## 2023-10-28 DIAGNOSIS — M19012 Primary osteoarthritis, left shoulder: Secondary | ICD-10-CM | POA: Diagnosis not present

## 2023-10-28 DIAGNOSIS — Z9181 History of falling: Secondary | ICD-10-CM | POA: Diagnosis not present

## 2023-10-28 DIAGNOSIS — D649 Anemia, unspecified: Secondary | ICD-10-CM | POA: Diagnosis not present

## 2023-10-28 DIAGNOSIS — Z7901 Long term (current) use of anticoagulants: Secondary | ICD-10-CM | POA: Diagnosis not present

## 2023-10-28 DIAGNOSIS — E119 Type 2 diabetes mellitus without complications: Secondary | ICD-10-CM | POA: Diagnosis not present

## 2023-10-28 DIAGNOSIS — Z7985 Long-term (current) use of injectable non-insulin antidiabetic drugs: Secondary | ICD-10-CM | POA: Diagnosis not present

## 2023-10-28 DIAGNOSIS — Z7984 Long term (current) use of oral hypoglycemic drugs: Secondary | ICD-10-CM | POA: Diagnosis not present

## 2023-10-28 DIAGNOSIS — I519 Heart disease, unspecified: Secondary | ICD-10-CM | POA: Diagnosis not present

## 2023-10-28 DIAGNOSIS — E7800 Pure hypercholesterolemia, unspecified: Secondary | ICD-10-CM | POA: Diagnosis not present

## 2023-10-28 DIAGNOSIS — E876 Hypokalemia: Secondary | ICD-10-CM | POA: Diagnosis not present

## 2023-10-28 DIAGNOSIS — L89151 Pressure ulcer of sacral region, stage 1: Secondary | ICD-10-CM | POA: Diagnosis not present

## 2023-10-28 DIAGNOSIS — F32A Depression, unspecified: Secondary | ICD-10-CM | POA: Diagnosis not present

## 2023-10-28 DIAGNOSIS — M19011 Primary osteoarthritis, right shoulder: Secondary | ICD-10-CM | POA: Diagnosis not present

## 2023-10-31 DIAGNOSIS — Z7984 Long term (current) use of oral hypoglycemic drugs: Secondary | ICD-10-CM | POA: Diagnosis not present

## 2023-10-31 DIAGNOSIS — Z7901 Long term (current) use of anticoagulants: Secondary | ICD-10-CM | POA: Diagnosis not present

## 2023-10-31 DIAGNOSIS — I519 Heart disease, unspecified: Secondary | ICD-10-CM | POA: Diagnosis not present

## 2023-10-31 DIAGNOSIS — E7800 Pure hypercholesterolemia, unspecified: Secondary | ICD-10-CM | POA: Diagnosis not present

## 2023-10-31 DIAGNOSIS — D649 Anemia, unspecified: Secondary | ICD-10-CM | POA: Diagnosis not present

## 2023-10-31 DIAGNOSIS — E119 Type 2 diabetes mellitus without complications: Secondary | ICD-10-CM | POA: Diagnosis not present

## 2023-10-31 DIAGNOSIS — Z9181 History of falling: Secondary | ICD-10-CM | POA: Diagnosis not present

## 2023-10-31 DIAGNOSIS — L89151 Pressure ulcer of sacral region, stage 1: Secondary | ICD-10-CM | POA: Diagnosis not present

## 2023-10-31 DIAGNOSIS — M19012 Primary osteoarthritis, left shoulder: Secondary | ICD-10-CM | POA: Diagnosis not present

## 2023-10-31 DIAGNOSIS — Z7985 Long-term (current) use of injectable non-insulin antidiabetic drugs: Secondary | ICD-10-CM | POA: Diagnosis not present

## 2023-10-31 DIAGNOSIS — F32A Depression, unspecified: Secondary | ICD-10-CM | POA: Diagnosis not present

## 2023-10-31 DIAGNOSIS — E876 Hypokalemia: Secondary | ICD-10-CM | POA: Diagnosis not present

## 2023-10-31 DIAGNOSIS — M19011 Primary osteoarthritis, right shoulder: Secondary | ICD-10-CM | POA: Diagnosis not present

## 2023-11-04 DIAGNOSIS — Z7901 Long term (current) use of anticoagulants: Secondary | ICD-10-CM | POA: Diagnosis not present

## 2023-11-04 DIAGNOSIS — F32A Depression, unspecified: Secondary | ICD-10-CM | POA: Diagnosis not present

## 2023-11-04 DIAGNOSIS — Z7985 Long-term (current) use of injectable non-insulin antidiabetic drugs: Secondary | ICD-10-CM | POA: Diagnosis not present

## 2023-11-04 DIAGNOSIS — D649 Anemia, unspecified: Secondary | ICD-10-CM | POA: Diagnosis not present

## 2023-11-04 DIAGNOSIS — L89151 Pressure ulcer of sacral region, stage 1: Secondary | ICD-10-CM | POA: Diagnosis not present

## 2023-11-04 DIAGNOSIS — M19012 Primary osteoarthritis, left shoulder: Secondary | ICD-10-CM | POA: Diagnosis not present

## 2023-11-04 DIAGNOSIS — E119 Type 2 diabetes mellitus without complications: Secondary | ICD-10-CM | POA: Diagnosis not present

## 2023-11-04 DIAGNOSIS — M19011 Primary osteoarthritis, right shoulder: Secondary | ICD-10-CM | POA: Diagnosis not present

## 2023-11-04 DIAGNOSIS — Z9181 History of falling: Secondary | ICD-10-CM | POA: Diagnosis not present

## 2023-11-04 DIAGNOSIS — E876 Hypokalemia: Secondary | ICD-10-CM | POA: Diagnosis not present

## 2023-11-04 DIAGNOSIS — Z7984 Long term (current) use of oral hypoglycemic drugs: Secondary | ICD-10-CM | POA: Diagnosis not present

## 2023-11-04 DIAGNOSIS — I519 Heart disease, unspecified: Secondary | ICD-10-CM | POA: Diagnosis not present

## 2023-11-04 DIAGNOSIS — E7800 Pure hypercholesterolemia, unspecified: Secondary | ICD-10-CM | POA: Diagnosis not present

## 2023-11-05 DIAGNOSIS — Z7901 Long term (current) use of anticoagulants: Secondary | ICD-10-CM | POA: Diagnosis not present

## 2023-11-05 DIAGNOSIS — Z7985 Long-term (current) use of injectable non-insulin antidiabetic drugs: Secondary | ICD-10-CM | POA: Diagnosis not present

## 2023-11-05 DIAGNOSIS — I519 Heart disease, unspecified: Secondary | ICD-10-CM | POA: Diagnosis not present

## 2023-11-05 DIAGNOSIS — F32A Depression, unspecified: Secondary | ICD-10-CM | POA: Diagnosis not present

## 2023-11-05 DIAGNOSIS — M19011 Primary osteoarthritis, right shoulder: Secondary | ICD-10-CM | POA: Diagnosis not present

## 2023-11-05 DIAGNOSIS — M19012 Primary osteoarthritis, left shoulder: Secondary | ICD-10-CM | POA: Diagnosis not present

## 2023-11-05 DIAGNOSIS — E7800 Pure hypercholesterolemia, unspecified: Secondary | ICD-10-CM | POA: Diagnosis not present

## 2023-11-05 DIAGNOSIS — L89151 Pressure ulcer of sacral region, stage 1: Secondary | ICD-10-CM | POA: Diagnosis not present

## 2023-11-05 DIAGNOSIS — E876 Hypokalemia: Secondary | ICD-10-CM | POA: Diagnosis not present

## 2023-11-05 DIAGNOSIS — Z9181 History of falling: Secondary | ICD-10-CM | POA: Diagnosis not present

## 2023-11-05 DIAGNOSIS — Z7984 Long term (current) use of oral hypoglycemic drugs: Secondary | ICD-10-CM | POA: Diagnosis not present

## 2023-11-05 DIAGNOSIS — D649 Anemia, unspecified: Secondary | ICD-10-CM | POA: Diagnosis not present

## 2023-11-05 DIAGNOSIS — E119 Type 2 diabetes mellitus without complications: Secondary | ICD-10-CM | POA: Diagnosis not present

## 2023-11-11 DIAGNOSIS — Z7901 Long term (current) use of anticoagulants: Secondary | ICD-10-CM | POA: Diagnosis not present

## 2023-11-11 DIAGNOSIS — Z9181 History of falling: Secondary | ICD-10-CM | POA: Diagnosis not present

## 2023-11-11 DIAGNOSIS — E7800 Pure hypercholesterolemia, unspecified: Secondary | ICD-10-CM | POA: Diagnosis not present

## 2023-11-11 DIAGNOSIS — Z7985 Long-term (current) use of injectable non-insulin antidiabetic drugs: Secondary | ICD-10-CM | POA: Diagnosis not present

## 2023-11-11 DIAGNOSIS — D649 Anemia, unspecified: Secondary | ICD-10-CM | POA: Diagnosis not present

## 2023-11-11 DIAGNOSIS — E119 Type 2 diabetes mellitus without complications: Secondary | ICD-10-CM | POA: Diagnosis not present

## 2023-11-11 DIAGNOSIS — L89151 Pressure ulcer of sacral region, stage 1: Secondary | ICD-10-CM | POA: Diagnosis not present

## 2023-11-11 DIAGNOSIS — E876 Hypokalemia: Secondary | ICD-10-CM | POA: Diagnosis not present

## 2023-11-11 DIAGNOSIS — M19011 Primary osteoarthritis, right shoulder: Secondary | ICD-10-CM | POA: Diagnosis not present

## 2023-11-11 DIAGNOSIS — Z7984 Long term (current) use of oral hypoglycemic drugs: Secondary | ICD-10-CM | POA: Diagnosis not present

## 2023-11-11 DIAGNOSIS — I519 Heart disease, unspecified: Secondary | ICD-10-CM | POA: Diagnosis not present

## 2023-11-11 DIAGNOSIS — F32A Depression, unspecified: Secondary | ICD-10-CM | POA: Diagnosis not present

## 2023-11-11 DIAGNOSIS — M19012 Primary osteoarthritis, left shoulder: Secondary | ICD-10-CM | POA: Diagnosis not present

## 2023-11-12 DIAGNOSIS — M19011 Primary osteoarthritis, right shoulder: Secondary | ICD-10-CM | POA: Diagnosis not present

## 2023-11-12 DIAGNOSIS — E876 Hypokalemia: Secondary | ICD-10-CM | POA: Diagnosis not present

## 2023-11-12 DIAGNOSIS — M19012 Primary osteoarthritis, left shoulder: Secondary | ICD-10-CM | POA: Diagnosis not present

## 2023-11-12 DIAGNOSIS — E7800 Pure hypercholesterolemia, unspecified: Secondary | ICD-10-CM | POA: Diagnosis not present

## 2023-11-12 DIAGNOSIS — L89151 Pressure ulcer of sacral region, stage 1: Secondary | ICD-10-CM | POA: Diagnosis not present

## 2023-11-12 DIAGNOSIS — E119 Type 2 diabetes mellitus without complications: Secondary | ICD-10-CM | POA: Diagnosis not present

## 2023-11-12 DIAGNOSIS — Z7984 Long term (current) use of oral hypoglycemic drugs: Secondary | ICD-10-CM | POA: Diagnosis not present

## 2023-11-12 DIAGNOSIS — Z7901 Long term (current) use of anticoagulants: Secondary | ICD-10-CM | POA: Diagnosis not present

## 2023-11-12 DIAGNOSIS — Z9181 History of falling: Secondary | ICD-10-CM | POA: Diagnosis not present

## 2023-11-12 DIAGNOSIS — Z7985 Long-term (current) use of injectable non-insulin antidiabetic drugs: Secondary | ICD-10-CM | POA: Diagnosis not present

## 2023-11-12 DIAGNOSIS — D649 Anemia, unspecified: Secondary | ICD-10-CM | POA: Diagnosis not present

## 2023-11-12 DIAGNOSIS — F32A Depression, unspecified: Secondary | ICD-10-CM | POA: Diagnosis not present

## 2023-11-12 DIAGNOSIS — I519 Heart disease, unspecified: Secondary | ICD-10-CM | POA: Diagnosis not present

## 2023-11-14 ENCOUNTER — Telehealth: Payer: Self-pay

## 2023-11-14 MED ORDER — TRAMADOL HCL 50 MG PO TABS
50.0000 mg | ORAL_TABLET | Freq: Four times a day (QID) | ORAL | 0 refills | Status: DC | PRN
Start: 2023-11-14 — End: 2023-12-05

## 2023-11-14 NOTE — Telephone Encounter (Signed)
 Patient calls nurse line requesting refill on Tramadol .   She reports that provider advised her to call whenever she needs refill, however, medication is not on current med list.  If appropriate, please send refill to [facility].    Please advise.   Chiquita JAYSON English, RN

## 2023-11-14 NOTE — Telephone Encounter (Signed)
 Appears medication had expired 11/07/23 in Epic. Unsure why this occurred. Will refill tramadol for chronic, non-operable, severe, bilateral shoulder OA.

## 2023-11-20 ENCOUNTER — Ambulatory Visit (INDEPENDENT_AMBULATORY_CARE_PROVIDER_SITE_OTHER): Payer: No Typology Code available for payment source | Admitting: Podiatry

## 2023-11-20 DIAGNOSIS — M79674 Pain in right toe(s): Secondary | ICD-10-CM

## 2023-11-20 DIAGNOSIS — B351 Tinea unguium: Secondary | ICD-10-CM

## 2023-11-20 DIAGNOSIS — M79675 Pain in left toe(s): Secondary | ICD-10-CM

## 2023-11-20 NOTE — Progress Notes (Signed)
 Subjective:  Patient ID: [name], female    DOB: 11-28-1940,  MRN: [id number]   [name] presents to clinic today for:  Chief Complaint  Patient presents with   [facility]    [facility] A1C 8.1   Patient notes nails are thick, discolored, elongated and painful in shoegear when trying to ambulate.    PCP is Tharon Lung, MD.  Past Medical History:  Diagnosis Date   Acute cystitis with hematuria 09/01/2020   ANXIETY 01/31/2009   Qualifier: Diagnosis of  By: Leana, CNA, Christy     Arthritis    qwhere (03/30/2016)   Bilateral carpal tunnel syndrome 02/13/2021   CAD (coronary artery disease) 05/2008   a. s/p CABG in 2009.   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 Postoperative in 2009;  S/P transesophageal echocardiography-guided cardioversion, previously on Amiodarone  and Coumadin. b. recurrent in April 2013 -> pt elected rate control strategy, initially Coumadin -> had stroke with subtherapeutic INR in 03/2016 and transitioned to Eliquis .   Macular degeneration    Melena 01/10/2021   Morbid obesity (HCC) 03/26/2017   OSA on CPAP    severe OSA with AHI 34/hr   Proliferative diabetic retinopathy of right eye (HCC)  04/17/2021   Found today at delivery of PRP for what was thought to be severe NPDR OD   Rectal bleeding    Thrombocytopenia (HCC)     Past Surgical History:  Procedure Laterality Date   APPENDECTOMY  1946   BACK SURGERY     CARPAL TUNNEL RELEASE Left 03/28/2021   Procedure: LEFT CARPAL TUNNEL RELEASE;  Surgeon: Murrell Kuba, MD;  Location: [facility] OR;  Service: Orthopedics;  Laterality: Left;  60 MIN   CARPAL TUNNEL RELEASE Right 09/15/2021   Procedure: RIGHT CARPAL TUNNEL RELEASE;  Surgeon: Murrell Kuba, MD;  Location: [facility] OR;  Service: Orthopedics;  Laterality: Right;   CATARACT EXTRACTION Right 2012   COLONOSCOPY WITH PROPOFOL  Left 03/27/2017   Procedure: COLONOSCOPY WITH PROPOFOL ;  Surgeon: Saintclair Jasper, MD;  Location: [facility] ENDOSCOPY;  Service: Gastroenterology;  Laterality: Left;   CORONARY ANGIOPLASTY WITH STENT PLACEMENT  2009   put 2 stents in   EXCISION METACARPAL MASS Left 03/28/2021   Procedure: EXCISION MASS LEFT THUMB;  Surgeon: Murrell Kuba, MD;  Location: [facility] OR;  Service: Orthopedics;  Laterality: Left;   EYE SURGERY     bilateral cataract   HERNIA REPAIR     IR LUMBAR DISC ASPIRATION W/IMG GUIDE  10/08/2018   JOINT REPLACEMENT     TEE WITH CARDIOVERSION     Postoperative in 2009;  S/P transesophageal echocardiography-guided cardioversion,   TOTAL KNEE ARTHROPLASTY Right 1994   VENTRAL HERNIA  REPAIR  10/1999   With mesh    Allergies  Allergen Reactions   Benazepril Other (See Comments)    Unknown reaction at age 68-65 - possibly dizziness   Cymbalta  [Duloxetine  Hcl]     Itch, Dry mouth    Ozempic  (0.25 Or 0.5 Mg-Dose) [Semaglutide (0.25 Or 0.5mg -Dos)] Diarrhea    Gi intollerance   Tape     TOLERATES PAPER TAPE ONLY- due to thin skin. NOT ALLERGY PER PT   Angiotensin Receptor Blockers Other (See Comments)    Hypotension reaction   Bactrim [Sulfamethoxazole-Trimethoprim] Other (See Comments)    Unknown reaction   Fe-Succ-C-Thre-B12-Des Stomach Other (See Comments)     Unknown reaction  Ferocon?    Review of Systems: Negative except as noted in the HPI.  Objective:  [name] is a pleasant 83 y.o. female in NAD. AAO x 3.  Vascular Examination: Capillary refill time is 3-5 seconds to toes bilateral. Palpable pedal pulses b/l LE. Digital hair present b/l.  Skin temperature gradient WNL b/l. No varicosities b/l. No cyanosis noted b/l.   Dermatological Examination: Pedal skin with normal turgor, texture and tone b/l. No open wounds. No interdigital macerations b/l. Toenails x10 are 3mm thick, discolored, dystrophic with subungual debris. There is pain with compression of the nail plates.  They are elongated x10     Latest Ref Rng & Units 10/08/2023   10:58 AM 05/21/2023    3:24 PM  Hemoglobin A1C  Hemoglobin-A1c 0.0 - 7.0 % 6.4  6.3    Assessment/Plan: 1. Pain due to onychomycosis of toenails of both feet     The mycotic toenails were sharply debrided x10 with sterile nail nippers and a power debriding burr to decrease bulk/thickness and length.    Return in about 3 months (around 02/18/2024) for [facility].   Awanda CHARM Imperial, DPM, FACFAS [facility]     [address] [place], [place] [address]                Office [phone]  Fax [phone]

## 2023-11-21 ENCOUNTER — Ambulatory Visit (HOSPITAL_BASED_OUTPATIENT_CLINIC_OR_DEPARTMENT_OTHER): Payer: Medicare HMO | Admitting: Internal Medicine

## 2023-11-25 ENCOUNTER — Encounter: Payer: Self-pay | Admitting: Family Medicine

## 2023-11-25 ENCOUNTER — Ambulatory Visit (INDEPENDENT_AMBULATORY_CARE_PROVIDER_SITE_OTHER): Payer: Self-pay | Admitting: Family Medicine

## 2023-11-25 ENCOUNTER — Encounter (HOSPITAL_COMMUNITY): Payer: Self-pay | Admitting: Emergency Medicine

## 2023-11-25 ENCOUNTER — Emergency Department (HOSPITAL_COMMUNITY)
Admission: EM | Admit: 2023-11-25 | Discharge: 2023-11-26 | Discharge status: Against Medical Advice | Payer: No Typology Code available for payment source | Attending: Emergency Medicine | Admitting: Emergency Medicine

## 2023-11-25 VITALS — BP 139/64 | HR 55 | Ht 69.0 in | Wt 228.0 lb

## 2023-11-25 DIAGNOSIS — Z5321 Procedure and treatment not carried out due to patient leaving prior to being seen by health care provider: Secondary | ICD-10-CM | POA: Insufficient documentation

## 2023-11-25 DIAGNOSIS — R58 Hemorrhage, not elsewhere classified: Secondary | ICD-10-CM | POA: Insufficient documentation

## 2023-11-25 DIAGNOSIS — R350 Frequency of micturition: Secondary | ICD-10-CM | POA: Diagnosis not present

## 2023-11-25 DIAGNOSIS — K921 Melena: Secondary | ICD-10-CM | POA: Insufficient documentation

## 2023-11-25 DIAGNOSIS — K625 Hemorrhage of anus and rectum: Secondary | ICD-10-CM | POA: Diagnosis present

## 2023-11-25 DIAGNOSIS — R319 Hematuria, unspecified: Secondary | ICD-10-CM | POA: Diagnosis not present

## 2023-11-25 DIAGNOSIS — Z7901 Long term (current) use of anticoagulants: Secondary | ICD-10-CM | POA: Insufficient documentation

## 2023-11-25 DIAGNOSIS — R5383 Other fatigue: Secondary | ICD-10-CM | POA: Diagnosis not present

## 2023-11-25 DIAGNOSIS — N939 Abnormal uterine and vaginal bleeding, unspecified: Secondary | ICD-10-CM | POA: Insufficient documentation

## 2023-11-25 LAB — POCT URINALYSIS DIP (MANUAL ENTRY)
Bilirubin, UA: NEGATIVE
Glucose, UA: 100 mg/dL — AB
Ketones, POC UA: NEGATIVE mg/dL
Nitrite, UA: NEGATIVE
Spec Grav, UA: 1.01 (ref 1.010–1.025)
Urobilinogen, UA: 0.2 U/dL
pH, UA: 5.5 (ref 5.0–8.0)

## 2023-11-25 LAB — TYPE AND SCREEN
ABO/RH(D): O POS
Antibody Screen: NEGATIVE

## 2023-11-25 LAB — POCT HEMOGLOBIN: Hemoglobin: 8.9 g/dL — AB (ref 11–14.6)

## 2023-11-25 LAB — POCT UA - MICROSCOPIC ONLY
RBC, Urine, Miroscopic: >20 (ref 0–2)
WBC, Ur, HPF, POC: >20 (ref 0–5)

## 2023-11-25 LAB — CBC WITH DIFFERENTIAL/PLATELET
Abs Immature Granulocytes: 0.03 10*3/uL (ref 0.00–0.07)
Basophils Absolute: 0 10*3/uL (ref 0.0–0.1)
Basophils Relative: 0 %
Eosinophils Absolute: 0.2 10*3/uL (ref 0.0–0.5)
Eosinophils Relative: 4 %
HCT: 35.2 % — ABNORMAL LOW (ref 36.0–46.0)
Hemoglobin: 10.9 g/dL — ABNORMAL LOW (ref 12.0–15.0)
Immature Granulocytes: 1 %
Lymphocytes Relative: 21 %
Lymphs Abs: 1.2 10*3/uL (ref 0.7–4.0)
MCH: 29.9 pg (ref 26.0–34.0)
MCHC: 31 g/dL (ref 30.0–36.0)
MCV: 96.4 fL (ref 80.0–100.0)
Monocytes Absolute: 0.7 10*3/uL (ref 0.1–1.0)
Monocytes Relative: 12 %
Neutro Abs: 3.6 10*3/uL (ref 1.7–7.7)
Neutrophils Relative %: 62 %
Platelets: 146 10*3/uL — ABNORMAL LOW (ref 150–400)
RBC: 3.65 MIL/uL — ABNORMAL LOW (ref 3.87–5.11)
RDW: 15.3 % (ref 11.5–15.5)
WBC: 5.9 10*3/uL (ref 4.0–10.5)
nRBC: 0 % (ref 0.0–0.2)

## 2023-11-25 LAB — COMPREHENSIVE METABOLIC PANEL
ALT: 12 U/L (ref 0–44)
AST: 16 U/L (ref 15–41)
Albumin: 3.7 g/dL (ref 3.5–5.0)
Alkaline Phosphatase: 96 U/L (ref 38–126)
Anion gap: 13 (ref 5–15)
BUN: 69 mg/dL — ABNORMAL HIGH (ref 8–23)
CO2: 23 mmol/L (ref 22–32)
Calcium: 10.1 mg/dL (ref 8.9–10.3)
Chloride: 102 mmol/L (ref 98–111)
Creatinine, Ser: 1.92 mg/dL — ABNORMAL HIGH (ref 0.44–1.00)
GFR, Estimated: 25 mL/min — ABNORMAL LOW (ref >60)
Glucose, Bld: 103 mg/dL — ABNORMAL HIGH (ref 70–99)
Potassium: 4.1 mmol/L (ref 3.5–5.1)
Sodium: 138 mmol/L (ref 135–145)
Total Bilirubin: 0.7 mg/dL (ref 0.0–1.2)
Total Protein: 7 g/dL (ref 6.5–8.1)

## 2023-11-25 LAB — PROTIME-INR
INR: 1.4 — ABNORMAL HIGH (ref 0.8–1.2)
Prothrombin Time: 17.7 s — ABNORMAL HIGH (ref 11.4–15.2)

## 2023-11-25 NOTE — Assessment & Plan Note (Addendum)
 Point of care hemoglobin 8.9, down from 11.6 two months ago.  Reassuringly without dizziness, shortness of breath, or difficulty ambulating from baseline.  Difficult to perform timely genitourinary exam given patient's functional status. Currently of unknown origin.  Less likely solely UTI with that degree of anemia.  Does have history of colonic hemorrhage which is higher on the differential, especially in the context of her taking Eliquis .  Given this, recommended patient be further evaluated in the emergency room.  Patient transported to ED by CMA.

## 2023-11-25 NOTE — ED Triage Notes (Signed)
 Pt is GI bleed on Eliquis with bright red passing of blood SUnday and 2 clots yesterday. Sent from MD office. Denies any chest pain, SOB, headaches, dizziness. PT does complain of fatigue.

## 2023-11-25 NOTE — Progress Notes (Signed)
    SUBJECTIVE:   CHIEF COMPLAINT / HPI:   Spotting and bleeding Noticed on Sunday night. Toilet bowl was red. Blood also on toilet paper. No vaginal burning but does have frequency/pressure. Not when she poops. Never had bleeding before. Had two small clots on her hygienic pad the next day. She is on eliquis . No dizziness. No other episodes of bleeding.  PERTINENT  PMH / PSH: Hypertension, ischemic cardiomyopathy, CAD, heart failure, A-fib on Eliquis , colonic hemorrhage due to diverticulosis, type 2 diabetes, CKD stage IV, HLD, blood loss anemia  OBJECTIVE:   BP 139/64   Pulse (!) 55   Ht 5' 9 (1.753 m)   Wt 228 lb (103.4 kg)   LMP  (LMP Unknown)   SpO2 100%   BMI 33.67 kg/m   General: Alert and oriented, in NAD Skin: Warm, dry; multiple bruises on left forearm HEENT: NCAT, EOM grossly normal, midline nasal septum Cardiac: Bradycardic, regular rhythm, no m/r/g appreciated Respiratory: CTAB, breathing and speaking comfortably on RA Abdominal: Soft, nondistended, normoactive bowel sounds Neurological: No gross focal deficit, mobilizing at baseline with walker Psychiatric: Appropriate mood and affect   ASSESSMENT/PLAN:   Bleeding Point of care hemoglobin 8.9, down from 11.6 two months ago.  Reassuringly without dizziness, shortness of breath, or difficulty ambulating from baseline.  Difficult to perform timely genitourinary exam given patient's functional status. Currently of unknown origin.  Less likely solely UTI with that degree of anemia.  Does have history of colonic hemorrhage which is higher on the differential, especially in the context of her taking Eliquis .  Given this, recommended patient be further evaluated in the emergency room.  Patient transported to ED by CMA.  Stuart Redo, MD [facility]

## 2023-11-25 NOTE — ED Provider Triage Note (Signed)
 Emergency Medicine Provider Triage Evaluation Note  ADVIKA MCLELLAND , a 83 y.o. female  was evaluated in triage.  Pt complains of rectal bleeding. On Elliquis, states she has been having hematochezia since Sunday. No pain. No history of similar. No abdominal pain. Saw her pcp today, found to have a hemoglobin 8.9. No shortness of breath or dizziness, does endorse some mild fatigue  Review of Systems  Positive:  Negative:   Physical Exam  BP 135/62 (BP Location: Right Arm)   Pulse (!) 59   Temp 98.1 F (36.7 C)   Resp 16   LMP  (LMP Unknown)   SpO2 100%  Gen:   Awake, no distress   Resp:  Normal effort  MSK:   Moves extremities without difficulty  Other:    Medical Decision Making  Medically screening exam initiated at 7:19 PM.  Appropriate orders placed.  Michelena E Ribble was informed that the remainder of the evaluation will be completed by another provider, this initial triage assessment does not replace that evaluation, and the importance of remaining in the ED until their evaluation is complete.     Nora Lauraine DELENA DEVONNA 11/25/23 1921

## 2023-11-25 NOTE — Patient Instructions (Signed)
 Because you have had such a large drop in your blood levels, I recommend you go to the emergency room for further evaluation for this bleed is coming from.  It is especially important since you are on the blood thinner Eliquis .  You will likely need an ultrasound and further evaluation to make sure this is not a bleed from your bowels.

## 2023-11-26 ENCOUNTER — Emergency Department (HOSPITAL_COMMUNITY)
Admission: EM | Admit: 2023-11-26 | Discharge: 2023-11-27 | Discharge status: Against Medical Advice | Payer: No Typology Code available for payment source | Source: Home / Self Care

## 2023-11-26 ENCOUNTER — Telehealth: Payer: Self-pay

## 2023-11-26 ENCOUNTER — Other Ambulatory Visit: Payer: Self-pay

## 2023-11-26 ENCOUNTER — Encounter (HOSPITAL_COMMUNITY): Payer: Self-pay | Admitting: Emergency Medicine

## 2023-11-26 ENCOUNTER — Telehealth: Payer: Self-pay | Admitting: Family Medicine

## 2023-11-26 DIAGNOSIS — Z5321 Procedure and treatment not carried out due to patient leaving prior to being seen by health care provider: Secondary | ICD-10-CM | POA: Insufficient documentation

## 2023-11-26 DIAGNOSIS — R350 Frequency of micturition: Secondary | ICD-10-CM | POA: Insufficient documentation

## 2023-11-26 DIAGNOSIS — R319 Hematuria, unspecified: Secondary | ICD-10-CM | POA: Insufficient documentation

## 2023-11-26 DIAGNOSIS — Z7901 Long term (current) use of anticoagulants: Secondary | ICD-10-CM | POA: Insufficient documentation

## 2023-11-26 DIAGNOSIS — K625 Hemorrhage of anus and rectum: Secondary | ICD-10-CM | POA: Insufficient documentation

## 2023-11-26 LAB — URINALYSIS, ROUTINE W REFLEX MICROSCOPIC: RBC / HPF: >50 RBC/hpf (ref 0–5)

## 2023-11-26 LAB — COMPREHENSIVE METABOLIC PANEL
ALT: 10 U/L (ref 0–44)
AST: 16 U/L (ref 15–41)
Albumin: 3.6 g/dL (ref 3.5–5.0)
Alkaline Phosphatase: 109 U/L (ref 38–126)
Anion gap: 12 (ref 5–15)
BUN: 68 mg/dL — ABNORMAL HIGH (ref 8–23)
CO2: 24 mmol/L (ref 22–32)
Calcium: 10 mg/dL (ref 8.9–10.3)
Chloride: 103 mmol/L (ref 98–111)
Creatinine, Ser: 2.22 mg/dL — ABNORMAL HIGH (ref 0.44–1.00)
GFR, Estimated: 21 mL/min — ABNORMAL LOW (ref >60)
Glucose, Bld: 139 mg/dL — ABNORMAL HIGH (ref 70–99)
Potassium: 4.4 mmol/L (ref 3.5–5.1)
Sodium: 139 mmol/L (ref 135–145)
Total Bilirubin: 0.7 mg/dL (ref 0.0–1.2)
Total Protein: 6.6 g/dL (ref 6.5–8.1)

## 2023-11-26 LAB — CBC WITH DIFFERENTIAL/PLATELET
Abs Immature Granulocytes: 0.01 10*3/uL (ref 0.00–0.07)
Basophils Absolute: 0 10*3/uL (ref 0.0–0.1)
Basophils Relative: 0 %
Eosinophils Absolute: 0.2 10*3/uL (ref 0.0–0.5)
Eosinophils Relative: 4 %
HCT: 33.8 % — ABNORMAL LOW (ref 36.0–46.0)
Hemoglobin: 10.4 g/dL — ABNORMAL LOW (ref 12.0–15.0)
Immature Granulocytes: 0 %
Lymphocytes Relative: 23 %
Lymphs Abs: 1.2 10*3/uL (ref 0.7–4.0)
MCH: 29.5 pg (ref 26.0–34.0)
MCHC: 30.8 g/dL (ref 30.0–36.0)
MCV: 96 fL (ref 80.0–100.0)
Monocytes Absolute: 0.8 10*3/uL (ref 0.1–1.0)
Monocytes Relative: 15 %
Neutro Abs: 3.1 10*3/uL (ref 1.7–7.7)
Neutrophils Relative %: 58 %
Platelets: 138 10*3/uL — ABNORMAL LOW (ref 150–400)
RBC: 3.52 MIL/uL — ABNORMAL LOW (ref 3.87–5.11)
RDW: 15.3 % (ref 11.5–15.5)
WBC: 5.2 10*3/uL (ref 4.0–10.5)
nRBC: 0 % (ref 0.0–0.2)

## 2023-11-26 MED ORDER — CEPHALEXIN 500 MG PO CAPS: 500.0000 mg | ORAL_CAPSULE | Freq: Every day | ORAL | 0 refills | Status: AC

## 2023-11-26 NOTE — Telephone Encounter (Signed)
 Patient LVM on nurse line in regards to ED visit.   She reports she waited until 11:30pm and never saw a doctor. She reports she had to go home.   She is unsure what she should do today or how to proceed.   Will forward to MD for advisement.

## 2023-11-26 NOTE — ED Notes (Signed)
 Pt called for vitals to be reassessed, no response.

## 2023-11-26 NOTE — Telephone Encounter (Signed)
 Called patient to discuss bleeding and ED visit.  UA at time of visit demonstrated leukocytes which, in the presence of urinary frequency, is likely concomitant UTI.  However, doubt UTI would cause such moderate reported blood loss.  Will send in Keflex  500 mg daily (instead of twice daily given GFR 25) for 5 days.  Patient has not noticed any more bleeding today.  She continues to deny dizziness, chest pain, shortness of breath.  She states she was unfortunately not evaluated for multiple hours at the ED, prompting her to head home.  CBC at that time demonstrated hemoglobin of 10.9, up from likely erroneous capillary hemoglobin of 8.9, and closer to baseline of 11.6 two months ago.  Discussed with patient would like to have close follow-up.  Will contact front office team to get her scheduled with me tomorrow morning at 11:30 AM.  At that time, we will further assess bleeding.  Will consider transvaginal ultrasound for uterine pathology, urgent GI referral given history of colonic hemorrhage, and repeat point-of-care hemoglobin.  Discussed returning to ED if in the meantime patient notices increased bleeding (and stopping Eliquis  at that time until she is evaluated), dizziness, chest pain, shortness of breath.  Patient endorsed understanding.

## 2023-11-26 NOTE — Telephone Encounter (Signed)
 Patient called and reported that she was still passing blood and wanted to let Dr. Phineas Real know that she was going back to the emergency room

## 2023-11-26 NOTE — ED Triage Notes (Signed)
 Patient complaining of bright red blood in stools since Sunday night. On eliquis. Denies chest pain, shortness of breath, and dizziness. Here yesterday for same but states she had to leave due to wait times.

## 2023-11-26 NOTE — ED Provider Triage Note (Signed)
 Emergency Medicine Provider Triage Evaluation Note  [name] , a 83 y.o. female  was evaluated in triage.  Pt complains of hematuria with passing clots and frequency that started Sunday night. On Eliquis   No hematochezia, rectal bleeding  Review of Systems  Positive: hematuria Negative: fevers  Physical Exam  BP 134/72   Pulse (!) 54   Temp 97.9 F (36.6 C) (Oral)   Resp 18   Ht 5' 9 (1.753 m)   Wt 99.8 kg   LMP  (LMP Unknown)   SpO2 100%   BMI 32.49 kg/m  Gen:   Awake, no distress   Resp:  Normal effort  MSK:   Moves extremities without difficulty  Other:    Medical Decision Making  Medically screening exam initiated at 8:19 PM.  Appropriate orders placed.  [name] was informed that the remainder of the evaluation will be completed by another provider, this initial triage assessment does not replace that evaluation, and the importance of remaining in the ED until their evaluation is complete.  Labs ordered   [name], [name] 11/26/23 2022

## 2023-11-27 ENCOUNTER — Ambulatory Visit (INDEPENDENT_AMBULATORY_CARE_PROVIDER_SITE_OTHER): Payer: No Typology Code available for payment source | Admitting: Family Medicine

## 2023-11-27 ENCOUNTER — Encounter: Payer: Self-pay | Admitting: Family Medicine

## 2023-11-27 ENCOUNTER — Other Ambulatory Visit: Payer: Self-pay | Admitting: *Deleted

## 2023-11-27 VITALS — BP 102/58 | HR 54 | Wt 228.0 lb

## 2023-11-27 DIAGNOSIS — R58 Hemorrhage, not elsewhere classified: Secondary | ICD-10-CM

## 2023-11-27 LAB — HEMOCCULT GUIAC POC 1CARD (OFFICE): Fecal Occult Blood, POC: NEGATIVE

## 2023-11-27 MED ORDER — MEGESTROL ACETATE 40 MG PO TABS: 40.0000 mg | ORAL_TABLET | Freq: Every day | ORAL | 0 refills | Status: DC

## 2023-11-27 NOTE — Assessment & Plan Note (Addendum)
 Bleeding continues.  Based on exam, likely vaginal in nature.  Patient is more fatigued, though continues to deny dizziness or lightheadedness.  Labs yesterday evening with stable CBC.  Discussed my recommendation for the quickest evaluation would be going to the emergency room.  After discussion with patient and son, they elected to pursue workup urgently on outpatient basis for now.  Discussed returning to the ED if bleeding or symptoms worsen while awaiting workup.  Advised to stop Eliquis  for now, though we discussed at length the risk of stroke while being off Eliquis  and awaiting workup. I was able to call [facility] imaging to schedule STAT transvaginal US  for further assessment given observed vaginal bleeding on my exam on 11/28/23 at 9 AM. I also placed urgent GI referral for further evaluation given history of colonic bleed, though my exam was notable for bleeding at the cervical os and FOBT negative reassuringly.  For symptom management the meantime, I consulted with Dr. Randel Buss with OB/GYN about starting Megace , and he recommended Megace  40 mg daily but can uptitrate to twice daily if symptoms not controlled.  Will further assess bleeding, vitals, and hemoglobin tomorrow morning after her ultrasound appointment with me at 10:50 AM.

## 2023-11-27 NOTE — Patient Outreach (Signed)
 Care Management   Visit Note  11/27/2023 Name: [name] MRN: [id number] DOB: 1941-09-20  Subjective: [name] is a 83 y.o. year old female who is a primary care patient of Dema Filler, MD. The Care Management team was consulted for assistance.      Engaged with patient spoke with patient by telephone.    Goals Addressed             This Visit's Progress    RNCM Care Management Expected Outcome: Monitor, Self-Manage and Reduce Symptoms of: Hypertension, DM, Osteoporosis       Current Barriers:  Knowledge Deficits related to plan of care for management of HTN, DMII, and Osteoporosis  Chronic Disease Management support and education needs related to HTN, DMII, and Osteoporosis   RNCM Clinical Goal(s):  Patient will verbalize basic understanding of  HTN, DMII, and Osteoporosis disease process and self health management plan as evidenced by verbal explanation, recognizing symptoms, lifestyle modifications to include maintaining a heart healthy/diabetic diet, remaining free of falls and continuing home exercises provided by PT as tolerated take all medications exactly as prescribed and will call provider for medication related questions as evidenced by compliance to all medications and monitoring for any side effects and notifying primary care provider, if any should arise.  attend all scheduled medical appointments: with primary care provider and specialist as evidenced by keeping all scheduled appointments demonstrate Improved and Ongoing adherence to prescribed treatment plan for HTN, DMII, and Osteoporosis as evidenced by consistent medication compliance, symptom monitoring, continued self-managment continue to work with RN Care Manager to address care management and care coordination needs related to  HTN, DMII, and Osteoporosis as evidenced by adherence to CM Team Scheduled appointments through collaboration with RN Care manager, provider, and care team.    Interventions: Evaluation of current treatment plan related to  self management and patient's adherence to plan as established by provider   Diabetes Interventions:  (Status:  Goal on track:  Yes.) Long Term Goal Assessed patient's understanding of A1c goal: <6.5% Provided education to patient about basic DM disease process. Denies any hypoglycemic or hyperglycemic episodes. Fasting blood sugar this morning was 125. Highest: 189 Lowest: 116. Reports that she gets meals on wheels that consist primarily of chicken and vegetables. Reviewed medications with patient and discussed importance of medication adherence. Reports compliance with Jardiance  and Trulicity  Counseled on importance of regular laboratory monitoring as prescribed Discussed plans with patient for ongoing care management follow up and provided patient with direct contact information for care management team Reviewed scheduled/upcoming provider appointments including: Diabetic Foot Exam with Podiatry on 11-20-2023 Review of patient status, including review of consultants reports, relevant laboratory and other test results, and medications completed Screening for signs and symptoms of depression related to chronic disease state  Assessed social determinant of health barriers Lab Results  Component Value Date   HGBA1C 6.4 10/08/2023    Hypertension Interventions:  (Status:  Goal on track:  Yes.) Long Term Goal Last practice recorded BP readings:  BP Readings from Last 3 Encounters:  11/26/23 134/72  11/27/23 (!) 102/58  11/25/23 135/62   Most recent eGFR/CrCl:  Lab Results  Component Value Date   EGFR 25 (L) 07/04/2023    No components found for: "CRCL"  Evaluation of current treatment plan related to hypertension self management and patient's adherence to plan as established by provider. Patient states she has not been checking at home for the last few days because she has been seen  in the office and ER. She has a Musician that helps with ADLs and assist patient in taking her blood pressure. She reports that her sacral area has healed and she no longer has home health. Provided education to patient re: stroke prevention, s/s of heart attack and stroke Reviewed medications with patient and discussed importance of compliance. Reports compliance with all medications Discussed plans with patient for ongoing care management follow up and provided patient with direct contact information for care management team Advised patient, providing education and rationale, to monitor blood pressure daily and record, calling PCP for findings outside established parameters Discussed complications of poorly controlled blood pressure such as heart disease, stroke, circulatory complications, vision complications, kidney impairment, sexual dysfunction Weighs 3x/week. States she was 228 yesterday.  Osteoporosis Interventions:  (Status:  Goal on track:  Yes.) Long Term Goal Pain assessment performed. Reports that she continues to have pain but reports compliance with her pain management Reviewed provider established plan for pain management Discussed importance of adherence to all scheduled medical appointments Counseled on the importance of reporting any/all new or changed pain symptoms or management strategies to pain management provider Advised patient to report to care team affect of pain on daily activities Discussed use of relaxation techniques and/or diversional activities to assist with pain reduction (distraction, imagery, relaxation, massage, acupressure, TENS, heat, and cold application   Anemia/Bleeding Interventions:  (Status:  New goal.) Short Term Goal Medications reviewed. Eliquis  discontinued by provider this morning. Counseled on bleeding risk associated with Eliquis  and importance of self-monitoring for signs/symptoms of bleeding. PCP stopped Eliquis  due to active bleeding. Patient aware and fully  understands the risk. She is scheduled for a transvaginal ultrasound on 11-28-23. Hemoccult negative at this time. Counseled on avoidance of NSAIDs due to increased bleeding risk Counseled on importance of regular laboratory monitoring as directed by provider Provided education about signs and symptoms of active bleeding such as stomach discomfort, coughing up blood or blood tinged secretions, bleeding from the gums/teeth, nosebleeds, increased bruising, blood in the urine/stool and/or if a traumatic injury occurs, regardless of severity of injury  Advised to call provider or 911 if active bleeding or signs and symptoms of active bleeding occur. RNCM reviewed with patient the importance of seeking help if her bleeding increases. Patient verbalizes full understanding and states she is trying to avoid the ER due to extended wait times. recommended promotion of rest and energy-conserving measures to manage fatigue, such as balancing activity with periods of rest encouraged strategies to prevent falls related to fatigue, weakness and dizziness; encouraged sitting before standing and using an assistive device. RNCM advised to continue to change positions slowly. encouraged optimal oral intake to support fluid balance and nutrition. Reports she eats meals on wheels. encouraged dietary changes to increase dietary intake of iron, Vitamin B12 and folic acid  if advised/prescribed Screening for signs and symptoms of depression related to chronic disease state Assessed social determinant of health barriers  Lab Results  Component Value Date   WBC 5.2 11/26/2023   HGB 10.4 (L) 11/26/2023   HCT 33.8 (L) 11/26/2023   MCV 96.0 11/26/2023   PLT 138 (L) 11/26/2023     Patient Goals/Self-Care Activities: Take all medications as prescribed Attend all scheduled provider appointments Call pharmacy for medication refills 3-7 days in advance of running out of medications Call provider office for new concerns or  questions  keep appointment with eye doctor check blood sugar at prescribed times: once daily and when you  have symptoms of low or high blood sugar check feet daily for cuts, sores or redness enter blood sugar readings and medication or insulin  into daily log keep a blood pressure log call doctor for signs and symptoms of high blood pressure take medications for blood pressure exactly as prescribed report new symptoms to your doctor  Follow Up Plan:  Telephone follow up appointment with care management team member scheduled for:  12-24-2023 at 9:00 am           Consent to Services:  Patient was given information about care management services, agreed to services, and gave verbal consent to participate.   Plan: Telephone follow up appointment with care management team member scheduled for:12-24-2023 at 9:00 am  Grandville Lax, BSN RN RN Care Manager  [facility]  Ambulatory Care Management  Direct Number: [phone]

## 2023-11-27 NOTE — Patient Instructions (Signed)
 I recommend you go back to the emergency room for the quickest evaluation.  This will be the quickest way for you to get an ultrasound and, if negative, be seen by the gastroenterologist.  This is especially important since you are on Eliquis  and are continuing to bleed.  We can also do workup outpatient, though the longer you are off Eliquis , the higher risk of stroke you are at.  Because of this risk, and because I am unsure of how quickly you will be able to get the studies done, this is why recommend going to the emergency room.

## 2023-11-27 NOTE — Progress Notes (Signed)
 SUBJECTIVE:   CHIEF COMPLAINT / HPI:   Genitourinary bleeding Previously seen on 11/25/2023 with this problem.  Had large volume of blood in the toilet.  Point-of-care hemoglobin at that time 8.9 down from 11.6 from prior.  Patient was instructed to go to ED for further evaluation.  Labs at that time with hemoglobin back near recent baseline, though patient left before being seen.  Discussed symptoms with patient on 11/26/2023 at which point she had stopped bleeding, however, she went back to the ED at my instruction given her bleeding started again after our call.  She was not able to stay at that time either due to long wait times.    Today, she reports having 2 more clots in the toilet and spotting on her pad.  She feels more fatigued than before.  Continues to deny dizziness or lightheadedness.  She really does not want to go back to the ED due to long wait times, but she will if she needs to.  Her son is also against going to the ED due to long wait times and would like to pursue outpatient workup.  She took her Eliquis  this morning.  PERTINENT  PMH / PSH: Pulmonary hypertension, ischemic cardiomyopathy, hypertension, CAD, chronic systolic heart failure, A-fib on Eliquis , OSA on CPAP, colonic hemorrhage due to diverticulosis, T2DM, OA of both shoulders, CKD stage IV, thrombocytopenia, IDA, HLD, history of CVA with residual deficit, chronic pain syndrome  OBJECTIVE:   BP (!) 102/58   Pulse (!) 54   Wt 228 lb (103.4 kg)   LMP  (LMP Unknown)   SpO2 94%   BMI 33.67 kg/m   General: Alert and oriented, in NAD HEENT: NCAT, EOM grossly normal, midline nasal septum Respiratory: Breathing and speaking comfortably on RA Abdominal: Soft, nontender, nondistended, normoactive bowel sounds Genitourinary: External vagina normal, bilateral labia majora folds with multiple bluish papules, cervix readily appreciable on speculum examination with scant blood around os, rectal exam without gross blood in  stool Neurological: No gross focal deficit, ambulating at baseline with walker Psychiatric: Appropriate mood and affect   ASSESSMENT/PLAN:   Bleeding Bleeding continues.  Based on exam, likely vaginal in nature.  Patient is more fatigued, though continues to deny dizziness or lightheadedness.  Labs yesterday evening with stable CBC.  Discussed my recommendation for the quickest evaluation would be going to the emergency room.  After discussion with patient and son, they elected to pursue workup urgently on outpatient basis for now.  Discussed returning to the ED if bleeding or symptoms worsen while awaiting workup.  Advised to stop Eliquis  for now, though we discussed at length the risk of stroke while being off Eliquis  and awaiting workup. I was able to call [facility] imaging to schedule STAT transvaginal US  for further assessment given observed vaginal bleeding on my exam on 11/28/23 at 9 AM. I also placed urgent GI referral for further evaluation given history of colonic bleed, though my exam was notable for bleeding at the cervical os and FOBT negative reassuringly.  For symptom management the meantime, I consulted with Dr. Randel Buss with OB/GYN about starting Megace , and he recommended Megace  40 mg daily but can uptitrate to twice daily if symptoms not controlled.  Will further assess bleeding, vitals, and hemoglobin tomorrow morning after her ultrasound appointment with me at 10:50 AM.   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  Appear to be asymptomatic.  Unlikely contributing to current presentation.  Follow clinically.  UTI Patient  continues to take Keflex  for evidence of UTI on previous UA.  Genetta Kenning, MD [facility]

## 2023-11-27 NOTE — Patient Instructions (Signed)
 Visit Information  Thank you for taking time to visit with me today. Please don't hesitate to contact me if I can be of assistance to you before our next scheduled telephone appointment.  Following are the goals we discussed today:   Goals Addressed             This Visit's Progress    RNCM Care Management Expected Outcome: Monitor, Self-Manage and Reduce Symptoms of: Hypertension, DM, Osteoporosis       Current Barriers:  Knowledge Deficits related to plan of care for management of HTN, DMII, and Osteoporosis  Chronic Disease Management support and education needs related to HTN, DMII, and Osteoporosis   RNCM Clinical Goal(s):  Patient will verbalize basic understanding of  HTN, DMII, and Osteoporosis disease process and self health management plan as evidenced by verbal explanation, recognizing symptoms, lifestyle modifications to include maintaining a heart healthy/diabetic diet, remaining free of falls and continuing home exercises provided by PT as tolerated take all medications exactly as prescribed and will call provider for medication related questions as evidenced by compliance to all medications and monitoring for any side effects and notifying primary care provider, if any should arise.  attend all scheduled medical appointments: with primary care provider and specialist as evidenced by keeping all scheduled appointments demonstrate Improved and Ongoing adherence to prescribed treatment plan for HTN, DMII, and Osteoporosis as evidenced by consistent medication compliance, symptom monitoring, continued self-managment continue to work with RN Care Manager to address care management and care coordination needs related to  HTN, DMII, and Osteoporosis as evidenced by adherence to CM Team Scheduled appointments through collaboration with RN Care manager, provider, and care team.   Interventions: Evaluation of current treatment plan related to  self management and patient's adherence to  plan as established by provider   Diabetes Interventions:  (Status:  Goal on track:  Yes.) Long Term Goal Assessed patient's understanding of A1c goal: <6.5% Provided education to patient about basic DM disease process. Denies any hypoglycemic or hyperglycemic episodes. Fasting blood sugar this morning was 125. Highest: 189 Lowest: 116. Reports that she gets meals on wheels that consist primarily of chicken and vegetables. Reviewed medications with patient and discussed importance of medication adherence. Reports compliance with Jardiance  and Trulicity  Counseled on importance of regular laboratory monitoring as prescribed Discussed plans with patient for ongoing care management follow up and provided patient with direct contact information for care management team Reviewed scheduled/upcoming provider appointments including: Diabetic Foot Exam with Podiatry on 11-20-2023 Review of patient status, including review of consultants reports, relevant laboratory and other test results, and medications completed Screening for signs and symptoms of depression related to chronic disease state  Assessed social determinant of health barriers Lab Results  Component Value Date   HGBA1C 6.4 10/08/2023    Hypertension Interventions:  (Status:  Goal on track:  Yes.) Long Term Goal Last practice recorded BP readings:  BP Readings from Last 3 Encounters:  11/26/23 134/72  11/27/23 (!) 102/58  11/25/23 135/62   Most recent eGFR/CrCl:  Lab Results  Component Value Date   EGFR 25 (L) 07/04/2023    No components found for: "CRCL"  Evaluation of current treatment plan related to hypertension self management and patient's adherence to plan as established by provider. Patient states she has not been checking at home for the last few days because she has been seen in the office and ER. She has a Engineer, agricultural that helps with ADLs and assist patient in  taking her blood pressure. She reports that her  sacral area has healed and she no longer has home health. Provided education to patient re: stroke prevention, s/s of heart attack and stroke Reviewed medications with patient and discussed importance of compliance. Reports compliance with all medications Discussed plans with patient for ongoing care management follow up and provided patient with direct contact information for care management team Advised patient, providing education and rationale, to monitor blood pressure daily and record, calling PCP for findings outside established parameters Discussed complications of poorly controlled blood pressure such as heart disease, stroke, circulatory complications, vision complications, kidney impairment, sexual dysfunction Weighs 3x/week. States she was 228 yesterday.  Osteoporosis Interventions:  (Status:  Goal on track:  Yes.) Long Term Goal Pain assessment performed. Reports that she continues to have pain but reports compliance with her pain management Reviewed provider established plan for pain management Discussed importance of adherence to all scheduled medical appointments Counseled on the importance of reporting any/all new or changed pain symptoms or management strategies to pain management provider Advised patient to report to care team affect of pain on daily activities Discussed use of relaxation techniques and/or diversional activities to assist with pain reduction (distraction, imagery, relaxation, massage, acupressure, TENS, heat, and cold application   Anemia/Bleeding Interventions:  (Status:  New goal.) Short Term Goal Medications reviewed. Eliquis  discontinued by provider this morning. Counseled on bleeding risk associated with Eliquis  and importance of self-monitoring for signs/symptoms of bleeding. PCP stopped Eliquis  due to active bleeding. Patient aware and fully understands the risk. She is scheduled for a transvaginal ultrasound on 11-28-23. Hemoccult negative at this  time. Counseled on avoidance of NSAIDs due to increased bleeding risk Counseled on importance of regular laboratory monitoring as directed by provider Provided education about signs and symptoms of active bleeding such as stomach discomfort, coughing up blood or blood tinged secretions, bleeding from the gums/teeth, nosebleeds, increased bruising, blood in the urine/stool and/or if a traumatic injury occurs, regardless of severity of injury  Advised to call provider or 911 if active bleeding or signs and symptoms of active bleeding occur. RNCM reviewed with patient the importance of seeking help if her bleeding increases. Patient verbalizes full understanding and states she is trying to avoid the ER due to extended wait times. recommended promotion of rest and energy-conserving measures to manage fatigue, such as balancing activity with periods of rest encouraged strategies to prevent falls related to fatigue, weakness and dizziness; encouraged sitting before standing and using an assistive device. RNCM advised to continue to change positions slowly. encouraged optimal oral intake to support fluid balance and nutrition. Reports she eats meals on wheels. encouraged dietary changes to increase dietary intake of iron, Vitamin B12 and folic acid  if advised/prescribed Screening for signs and symptoms of depression related to chronic disease state Assessed social determinant of health barriers  Lab Results  Component Value Date   WBC 5.2 11/26/2023   HGB 10.4 (L) 11/26/2023   HCT 33.8 (L) 11/26/2023   MCV 96.0 11/26/2023   PLT 138 (L) 11/26/2023     Patient Goals/Self-Care Activities: Take all medications as prescribed Attend all scheduled provider appointments Call pharmacy for medication refills 3-7 days in advance of running out of medications Call provider office for new concerns or questions  keep appointment with eye doctor check blood sugar at prescribed times: once daily and when you have  symptoms of low or high blood sugar check feet daily for cuts, sores or redness enter blood sugar  readings and medication or insulin  into daily log keep a blood pressure log call doctor for signs and symptoms of high blood pressure take medications for blood pressure exactly as prescribed report new symptoms to your doctor  Follow Up Plan:  Telephone follow up appointment with care management team member scheduled for:  12-24-2023 at 9:00 am           Our next appointment is by telephone on 12-24-2023 at 9:00 am  Please call the care guide team at [phone] if you need to cancel or reschedule your appointment.   If you are experiencing a Mental Health or Behavioral Health Crisis or need someone to talk to, please call the Suicide and Crisis Lifeline: 988 call the [place]  National Suicide Prevention Lifeline: 951-018-0506 or TTY: 8208096379 TTY 336-177-3898) to talk to a trained counselor call 1-800-273-TALK (toll free, 24 hour hotline) call 911   The patient verbalized understanding of instructions, educational materials, and care plan provided today and DECLINED offer to receive copy of patient instructions, educational materials, and care plan.   Telephone follow up appointment with care management team member scheduled for:12-24-2023 at 9:00 am  Grandville Lax, BSN RN RN Care Manager  [facility]  Ambulatory Care Management  Direct Number: [phone]

## 2023-11-28 ENCOUNTER — Telehealth: Payer: Self-pay

## 2023-11-28 ENCOUNTER — Ambulatory Visit (INDEPENDENT_AMBULATORY_CARE_PROVIDER_SITE_OTHER): Payer: No Typology Code available for payment source | Admitting: Family Medicine

## 2023-11-28 ENCOUNTER — Ambulatory Visit
Admit: 2023-11-28 | Discharge: 2023-11-28 | Disposition: A | Discharge status: Home / Self Care | Payer: No Typology Code available for payment source | Attending: Family Medicine | Admitting: Family Medicine

## 2023-11-28 VITALS — BP 122/60 | HR 84 | Wt 228.0 lb

## 2023-11-28 DIAGNOSIS — R58 Hemorrhage, not elsewhere classified: Secondary | ICD-10-CM

## 2023-11-28 DIAGNOSIS — N939 Abnormal uterine and vaginal bleeding, unspecified: Secondary | ICD-10-CM | POA: Diagnosis not present

## 2023-11-28 NOTE — Patient Instructions (Addendum)
I am happy you are able to get your ultrasound today.  I will follow-up results when I get them.  You may need a biopsy of the inside of your uterus. We will recheck your blood counts today.  I am happy to hear you have not been bleeding anymore, however.  Please be sure to take that Megace whenever you can. Keep taking the cephalexin.  I am glad this is helping with the urinary frequency.

## 2023-11-28 NOTE — Telephone Encounter (Signed)
Patient calls nurse line requesting to speak with PCP regarding results from pelvic ultrasound.   She also wanted to let provider know that she started Megace today, as pharmacy did not have medication in stock yesterday.   Patient requesting returned call at [phone].  Veronda Prude, RN

## 2023-11-28 NOTE — Progress Notes (Signed)
    SUBJECTIVE:   CHIEF COMPLAINT / HPI:   Follow up likely vaginal bleeding See previous note from 11/25/23 for more detail.  Was able to get her transvaginal ultrasound this morning.  Results are not back yet.  She notes she has not bled since yesterday.  She has not been able to pick up medication just yet, but she is going to today.  She has not taken her Eliquis now for 2 doses.  UTI She has been taking cephalexin as prescribed.  Her urinary frequency has improved a little already.  OBJECTIVE:   BP 122/60   Pulse 84   Wt 228 lb (103.4 kg)   LMP  (LMP Unknown)   SpO2 100%   BMI 33.67 kg/m   General: Alert and oriented, in NAD Skin: Warm, dry, and intact; solar purpura and bruises from prior needlesticks present on bilateral hands HEENT: NCAT, EOM grossly normal, midline nasal septum Cardiac: RRR, no m/r/g appreciated Respiratory: CTAB, breathing and speaking comfortably on RA Neurological: No gross focal deficit, ambulating at baseline with walker Psychiatric: Appropriate mood and affect   ASSESSMENT/PLAN:   Bleeding Reassuringly without persistent, profuse bleeding today and hemodynamically stable.  Awaiting transvaginal ultrasound results.  Will likely need endometrial biopsy, as my suspicion remains high for uterine source.  Advised to pick up Megace today.  GI referral has been sent to further rule out GI source given her history of colonic hemorrhage, though reassured by previous negative FOBT.  Will recheck CBC today to ensure steady hemoglobin and follow-up results with patient.  Will want to continue close follow-up while workup is ongoing.  UTI Improving on cephalexin.  Continue course.   Janeal Holmes, MD [facility]

## 2023-11-28 NOTE — Assessment & Plan Note (Addendum)
Reassuringly without persistent, profuse bleeding today and hemodynamically stable.  Awaiting transvaginal ultrasound results.  Will likely need endometrial biopsy, as my suspicion remains high for uterine source.  Advised to pick up Megace today.  GI referral has been sent to further rule out GI source given her history of colonic hemorrhage, though reassured by previous negative FOBT.  Will recheck CBC today to ensure steady hemoglobin and follow-up results with patient.  Will want to continue close follow-up while workup is ongoing.

## 2023-11-29 ENCOUNTER — Telehealth: Payer: Self-pay | Admitting: Family Medicine

## 2023-11-29 LAB — CBC
Hematocrit: 36 % (ref 34.0–46.6)
Hemoglobin: 11 g/dL — ABNORMAL LOW (ref 11.1–15.9)
MCH: 29.1 pg (ref 26.6–33.0)
MCHC: 30.6 g/dL — ABNORMAL LOW (ref 31.5–35.7)
MCV: 95 fL (ref 79–97)
Platelets: 137 10*3/uL — ABNORMAL LOW (ref 150–450)
RBC: 3.78 x10E6/uL (ref 3.77–5.28)
RDW: 13.1 % (ref 11.7–15.4)
WBC: 5.2 10*3/uL (ref 3.4–10.8)

## 2023-11-29 NOTE — Telephone Encounter (Signed)
Patient calls nurse line requesting to speak with Dr. Phineas Real.  She reports anxiety over scan yesterday and would like to discuss next steps.   I asked about her bleeding and she reports she has not bled any today that she has noticed.   Will forward to PCP.

## 2023-11-29 NOTE — Telephone Encounter (Signed)
Called patient to discuss TVUS and CBC results. TVUS without good windows for imaging due to difficulty with patient positioning. CBC reassuringly with stable Hgb at 11.  Patient passed some blood clots yesterday evening, but has not passed any this morning. She is taking her megace 40 mg daily.  We discussed need for endometrial biopsy given her vaginal bleeding. Soonest appointment is 12/12/2023. She is scheduled that day. We discussed risks and benefits of starting back on Eliquis for history of CVA and persistent afib; she decided to restart Eliquis today in hopes that megace would control bleeding in the meantime.  Discussed stopping eliquis and letting me know if she starts to bleed more before her next appointment. Patient very appreciative and will keep me updated on status.

## 2023-12-02 NOTE — Telephone Encounter (Signed)
Patient LVM on nurse line requesting to speak with PCP.   Called patient. She reports she took Eliquis on Saturday morning and reports she started bleeding around noon. She reports she did not take anymore Eliquis after that and reports she did not bleed anymore the remainder of the weekend.   She reports fear of heart attack and/or stroke continuing to stay off of Eliquis.   Advised will forward to PCP for advisement.

## 2023-12-02 NOTE — Telephone Encounter (Addendum)
Called patient to discuss bleeding concerns when going back on Eliquis.  She has been taking Megace 40 mg once daily.  After taking Eliquis this weekend and subsequently bleeding, she has not taken any more since.  She is understandably worried about the risk of a blood clot and stroke, which we have discussed extensively.  Again discussed risk of continuing Eliquis with increased bleeding versus stopping it with increased risk of stroke.  Given patient's preference, we will trial Megace 40 mg twice daily along with her Eliquis and assess bleeding response.  Discussed minimal clots in the absence of lightheadedness, shortness of breath, etc. likely not urgent.  Still, advised patient to keep me updated if any bleeding occurs.  Hopeful this regimen can keep bleeding at bay until colposcopy on 12/12/2023. Discussed holding eliquis day of procedure and resuming as normal on 12/13/2023. Patient understanding.

## 2023-12-03 ENCOUNTER — Encounter: Payer: Self-pay | Admitting: Family Medicine

## 2023-12-05 ENCOUNTER — Other Ambulatory Visit: Payer: Self-pay

## 2023-12-05 ENCOUNTER — Other Ambulatory Visit: Payer: Self-pay | Admitting: Family Medicine

## 2023-12-05 ENCOUNTER — Telehealth: Payer: Self-pay | Admitting: Family Medicine

## 2023-12-05 DIAGNOSIS — E1142 Type 2 diabetes mellitus with diabetic polyneuropathy: Secondary | ICD-10-CM

## 2023-12-05 MED ORDER — MEGESTROL ACETATE 40 MG PO TABS: 40.0000 mg | ORAL_TABLET | Freq: Two times a day (BID) | ORAL | 0 refills | Status: DC

## 2023-12-05 MED ORDER — TRAMADOL HCL 50 MG PO TABS: 50.0000 mg | ORAL_TABLET | Freq: Two times a day (BID) | ORAL | 3 refills | Status: DC | PRN

## 2023-12-05 NOTE — Telephone Encounter (Signed)
Called to check in on patient's bleeding. She has not been bleeding in megace 40 mg BID while on eliquis.  She notes the weather is causing her back and knee to hurt more. She has been using tramadol Q6H for the pain where prior she had used it BID. She needs more refills and would like to use her mail order pharmacy.  Checked recent Cr and GFR and noted slight decline. Given this, advised patient to try and take tramadol BID with tylenol and lidocaine patches in between. Advised can take more tramadol as absolutely needed. Discussed keeping me updated if pain worsens or is not controlled with this regimen.  Patient confirmed again that she will see [place] in clinic on 1/30 for endometrial biopsy.

## 2023-12-05 NOTE — Telephone Encounter (Signed)
Patient calls nurse line requesting refills on Tramadol and Megace.   She reports with her new insurance she will be utilizing the mail order pharmacy.   She asks to add refills to prescriptions.   Advised will forward to PCP.

## 2023-12-06 MED ORDER — ROPINIROLE HCL 0.5 MG PO TABS: 1.0000 mg | ORAL_TABLET | Freq: Every day | ORAL | 3 refills | Status: DC

## 2023-12-06 MED ORDER — ONETOUCH ULTRA 2 W/DEVICE KIT: PACK | 0 refills | Status: DC

## 2023-12-06 MED ORDER — SPIRONOLACTONE 25 MG PO TABS: 12.5000 mg | ORAL_TABLET | Freq: Every day | ORAL | 3 refills | Status: DC

## 2023-12-06 MED ORDER — APIXABAN 2.5 MG PO TABS: 2.5000 mg | ORAL_TABLET | Freq: Two times a day (BID) | ORAL | 3 refills | Status: DC

## 2023-12-06 MED ORDER — NITROGLYCERIN 0.4 MG SL SUBL: 0.4000 mg | SUBLINGUAL_TABLET | SUBLINGUAL | 5 refills | Status: DC | PRN

## 2023-12-06 MED ORDER — CARVEDILOL 6.25 MG PO TABS: 6.2500 mg | ORAL_TABLET | Freq: Two times a day (BID) | ORAL | 3 refills | Status: DC

## 2023-12-06 MED ORDER — ATORVASTATIN CALCIUM 40 MG PO TABS: 40.0000 mg | ORAL_TABLET | Freq: Every day | ORAL | 2 refills | Status: DC

## 2023-12-06 MED ORDER — TORSEMIDE 20 MG PO TABS: 80.0000 mg | ORAL_TABLET | Freq: Two times a day (BID) | ORAL | 5 refills | Status: DC

## 2023-12-06 MED ORDER — EMPAGLIFLOZIN 10 MG PO TABS: 10.0000 mg | ORAL_TABLET | Freq: Every day | ORAL | 1 refills | Status: DC

## 2023-12-06 MED ORDER — DICLOFENAC SODIUM 1 % EX GEL: 2.0000 g | CUTANEOUS | 1 refills | Status: DC | PRN

## 2023-12-09 ENCOUNTER — Telehealth: Payer: Self-pay

## 2023-12-09 DIAGNOSIS — I10 Essential (primary) hypertension: Secondary | ICD-10-CM

## 2023-12-09 NOTE — Telephone Encounter (Signed)
Patient calls nurse line requesting to speak with Dr. Phineas Real regarding concerns for upcoming procedure.   She is asking if she can eat/drink the morning prior to coming in. Advised that she will not be under anesthesia or sedation and that it would be fine to eat breakfast prior to appointment.   She also asked about Eliquis. Advised of eliquis management per prior note from Dr. Phineas Real.   She also states that she is having swelling in feet and legs. ~ 1 week.   Denies SHOB, difficulty breathing or chest pain.   Advised of return/ ED precautions.   Told patient that I would send message to provider to ask if she should take any medication for pain management prior to procedure.   Please advise.   Veronda Prude, RN

## 2023-12-10 ENCOUNTER — Other Ambulatory Visit: Payer: Self-pay

## 2023-12-10 NOTE — Telephone Encounter (Signed)
Received call from [facility] for clarification.  Clarified #60 is a 30 day supply.   No further action needed.

## 2023-12-10 NOTE — Telephone Encounter (Signed)
[facility] faxed a form for patient. Stating that it requires Clarification or is Incomplete about the medication Tramadol.  Placed form in PCP's box.  Drusilla Kanner, CMA

## 2023-12-11 ENCOUNTER — Telehealth (HOSPITAL_COMMUNITY): Payer: Self-pay

## 2023-12-11 DIAGNOSIS — I5022 Chronic systolic (congestive) heart failure: Secondary | ICD-10-CM

## 2023-12-11 MED ORDER — POTASSIUM CHLORIDE CRYS ER 20 MEQ PO TBCR: 60.0000 meq | EXTENDED_RELEASE_TABLET | Freq: Two times a day (BID) | ORAL | 1 refills | Status: DC

## 2023-12-11 NOTE — Telephone Encounter (Signed)
Medication request has been sent to patient's pharmacy.

## 2023-12-12 ENCOUNTER — Other Ambulatory Visit: Payer: Self-pay | Admitting: Family Medicine

## 2023-12-12 ENCOUNTER — Ambulatory Visit: Payer: No Typology Code available for payment source | Admitting: Family Medicine

## 2023-12-12 VITALS — BP 108/94 | HR 74 | Wt 225.6 lb

## 2023-12-12 DIAGNOSIS — N840 Polyp of corpus uteri: Secondary | ICD-10-CM | POA: Diagnosis not present

## 2023-12-12 DIAGNOSIS — N950 Postmenopausal bleeding: Secondary | ICD-10-CM

## 2023-12-12 NOTE — Patient Instructions (Signed)
ENDOMETRIAL BIOPSY POST-PROCEDURE INSTRUCTIONS  You may take Ibuprofen, Aleve or Tylenol for pain if needed.  Cramping should resolve within in 24 hours.  You may have a small amount of spotting.  You should wear a mini pad for the next few days.  You may have intercourse after 24 hours.  You need to call if you have any pelvic pain, fever, heavy bleeding or foul smelling vaginal discharge.  Shower or bathe as normal  6. We will call you within one week with results or we will discuss   the results at your follow-up appointment if needed.

## 2023-12-12 NOTE — Progress Notes (Signed)
    SUBJECTIVE:   CHIEF COMPLAINT / HPI:   Vaginal bleeding Seen on 11/28/2023 by PCP for vaginal bleeding.  Transvaginal ultrasound was obtained but unfortunately limited views were obtained and unable to visualize the endometrium.  She was also referred to GI at that time to rule out colonic source.  Today patient reports bleeding started on 11/26/2023.  States she went to the ED but was unable to be seen by a physician and left as she had an upcoming appointment.  Reports she did have blood in her urine describes a clot when giving her sample at the hospital.  Denies blood in her stool, occasionally has to take stool softeners to have a bowel movement.  Last bleeding 7 to 8 days ago.  Postmenopausal, reports going through menopause around 58.  Has not had any other episodes of bleeding since that time.  Denies family history of endometrial, ovarian or other cancers.  On Eliquis 2.5 mg twice daily, held morning dose.  PERTINENT  PMH / PSH: Hypertension, ischemic cardiomyopathy, CAD, heart failure, A-fib on Eliquis, colonic hemorrhage due to diverticulosis, type 2 diabetes, CKD stage IV, HLD, blood loss anemia   OBJECTIVE:   BP (!) 108/94   Pulse 74   Wt 225 lb 9.6 oz (102.3 kg)   LMP  (LMP Unknown)   SpO2 100%   BMI 33.32 kg/m    General: NAD, pleasant, able to participate in exam Pelvic exam: normal external genitalia, vulva, vagina, cervix, uterus and adnexa, CERVIX: normal appearing cervix without discharge or lesions, cervical stenosis present, atrophic, pale appearing cervix, exam chaperoned by Dahlia Client, RN  ASSESSMENT/PLAN:   Assessment & Plan Postmenopausal bleeding History concerning for endometrial hyperplasia vs cancer and PCP already initiated workup to rule out GI source.  Obtained endometrial biopsy, technically difficult exam due to patient mobility and atrophic/stenotic cervix but adequate sample collected. Will follow-up results with patient.  -F/u endometrial biopsy  results   Endometrial Biopsy (EMB) Procedure Note PRE-OP DIAGNOSIS: Postmenopausal bleeding POST-OP DIAGNOSIS: Same  PROCEDURE: endometrial biopsy Performing Physician: Elberta Fortis Supervising Physician (if applicable): Janit Pagan   PROCEDURE:  A timeout protocol was performed prior to initiating the procedure Bimanual examination was done to determine the position of the uterus.  Vaginal speculum was inserted, and the cervix was visualized.  The cervix was cleansed with antiseptic solution.  The device was inserted through the cervical canal, into the uterine cavity, and up to the fundus. A tenaculum may be placed on the cervix, but usually this is not necessary  The instrument was withdrawn as it was rotated 3-4 times to obtain the sample which was sent in formalin to pathology    Followup: The patient tolerated the procedure well without complications.  Standard post-procedure care is explained and return precautions are given.     Dr. Elberta Fortis, DO [facility]

## 2023-12-13 LAB — SURGICAL PATHOLOGY

## 2023-12-14 ENCOUNTER — Telehealth: Payer: Self-pay | Admitting: Family Medicine

## 2023-12-14 NOTE — Telephone Encounter (Signed)
The endometrial biopsy result was discussed; it was benign polyps but a scanty specimen. As discussed with her, if her bleeding continues, she should be referred to GYN by PCP. She stated she has only been spotting a little since the procedure. I advised F/U soon with PCP for reassessment. All questions were answered.   I will forward this conversation to PCP. Please refer to a gynecologist if bleeding persists.

## 2023-12-19 ENCOUNTER — Telehealth: Payer: Self-pay

## 2023-12-19 ENCOUNTER — Ambulatory Visit: Payer: No Typology Code available for payment source | Admitting: Student

## 2023-12-19 VITALS — BP 127/68 | HR 82 | Ht 69.0 in

## 2023-12-19 DIAGNOSIS — I878 Other specified disorders of veins: Secondary | ICD-10-CM

## 2023-12-19 DIAGNOSIS — E1142 Type 2 diabetes mellitus with diabetic polyneuropathy: Secondary | ICD-10-CM | POA: Diagnosis not present

## 2023-12-19 LAB — POCT GLYCOSYLATED HEMOGLOBIN (HGB A1C): HbA1c, POC (controlled diabetic range): 6.7 % (ref 0.0–7.0)

## 2023-12-19 MED ORDER — METOLAZONE 2.5 MG PO TABS: ORAL_TABLET | ORAL | 0 refills | Status: DC

## 2023-12-19 MED ORDER — NITROGLYCERIN 0.4 MG SL SUBL: 0.4000 mg | SUBLINGUAL_TABLET | SUBLINGUAL | 5 refills | Status: DC | PRN

## 2023-12-19 NOTE — Assessment & Plan Note (Signed)
 Bilateral +1 pitting edema.  Approximately 0.5 cm x 0.5 cm scab on left lower extremity, healing well and no signs of infection.  Do not believe she has an acute heart failure exacerbation.  Leg edema is reportedly better now than this morning.  Overall suspect her lack of metolazone  may be contributing to transiently increased leg edema. - Continue torsemide  80 mg twice daily - Refilled metolazone 

## 2023-12-19 NOTE — Patient Instructions (Signed)
 It was great to see you! Thank you for allowing me to participate in your care!   I recommend that you always bring your medications to each appointment as this makes it easy to ensure we are on the correct medications and helps us  not miss when refills are needed.  Our plans for today:  - We are check your A1C, we may make adjustments - Please keep taking your 80 mg of torsemide  in the monring and at night - I refilled metolazone  and nitroglycerin   - Follow-up with Dr. Tharon next week  Take care and seek immediate care sooner if you develop any concerns. Please remember to show up 15 minutes before your scheduled appointment time!  Gladis Church, DO [facility]

## 2023-12-19 NOTE — Telephone Encounter (Signed)
 Patient calls nurse line for multiple concerns.   (1) She reports her home blood sugars have been running in the 170s for ~ 1 week and this morning they were ~200. She reports this is not normal for her. She denies any symptoms of hyperglycemic event.   (2) She reports bilateral LE swelling. She reports blisters on both legs. She denies any leaking of fluid or shortness of breath. No chest pains. She reports compliance with torsemide .  (3) She reports pain in her shoulder and her back. She reports she has been taking tramadol  as prescribed with very minimal relief.  Patient scheduled this afternoon for evaluation.   Strict ED precautions discussed with patient in the meantime.

## 2023-12-19 NOTE — Assessment & Plan Note (Signed)
 Morning hyperglycemia for several days.  Previously well-controlled with Trulicity  and Jardiance .  Will check A1c today.  She is otherwise asymptomatic.  If A1c increased, could consider increasing her Trulicity  to 1.5 mg. - A1c - Continue Trulicity  0.75 mg weekly, Jardiance  10 mg daily

## 2023-12-19 NOTE — Progress Notes (Signed)
    SUBJECTIVE:   CHIEF COMPLAINT / HPI:   Hyperglycemia  leg edema Patient checks her blood pressure every morning, reports that ever since her endometrial biopsy on 11/15/2023 she has had elevated blood sugars in the morning.  She currently takes weekly Trulicity  and daily Jardiance .  She has chronic leg edema.  She reports that she has been out of her metolazone  but does not know for how long.  She reports that her leg edema worse over the last few days.  Additionally she was concerned about her ulcer on her leg.  Chronic shoulder pain and back pain Discussed that we likely did not have time to address all of her concerns from her phone call this morning.  She agreed to see Dr. Jerre next week to discuss further as he has been seeing her for this problem before and knows her very well.  No chest pain, no shortness of breath, no palpitations.  She is making urine, and reports she goes frequently due to her medications.   OBJECTIVE:   BP 127/68   Pulse 82   Ht 5' 9 (1.753 m)   LMP  (LMP Unknown)   SpO2 100%   BMI 33.32 kg/m    General: NAD, pleasant Cardio: RRR, no MRG. Cap Refill <2s. +1 bilateral pitting LE edema. Respiratory: CTAB, normal wob on RA GI: Abdomen is soft, not tender, not distended. BS present Skin: Warm and dry  ASSESSMENT/PLAN:   Assessment & Plan DM type 2 with diabetic peripheral neuropathy (HCC) Morning hyperglycemia for several days.  Previously well-controlled with Trulicity  and Jardiance .  Will check A1c today.  She is otherwise asymptomatic.  If A1c increased, could consider increasing her Trulicity  to 1.5 mg. - A1c - Continue Trulicity  0.75 mg weekly, Jardiance  10 mg daily Venous stasis Bilateral +1 pitting edema.  Approximately 0.5 cm x 0.5 cm scab on left lower extremity, healing well and no signs of infection.  Do not believe she has an acute heart failure exacerbation.  Leg edema is reportedly better now than this morning.  Overall suspect her lack  of metolazone  may be contributing to transiently increased leg edema. - Continue torsemide  80 mg twice daily - Refilled metolazone   Refilled Nitrostat  per patient request due to expired prescription.   Gladis Church, DO [facility]

## 2023-12-20 ENCOUNTER — Telehealth (HOSPITAL_COMMUNITY): Payer: Self-pay | Admitting: Cardiology

## 2023-12-20 NOTE — Telephone Encounter (Signed)
detailed message left for patient

## 2023-12-20 NOTE — Telephone Encounter (Signed)
 Pt was seen by PCP and PCP noticed severe BLE edema with blisters. PCP refilled metolazone  however pt is hesitant to take without clear instructions fro HF team   Pt reports weight is down, however declined weight check at PCP 2/6, last weight 1/30 @ 225 No home weight Reports SOB is normal Denies CP Reports compliance with fluid restrictions (drinks less than 32 ounces daily)  -BLE is chronic however blisters are new   Please advise

## 2023-12-23 ENCOUNTER — Ambulatory Visit (INDEPENDENT_AMBULATORY_CARE_PROVIDER_SITE_OTHER): Payer: No Typology Code available for payment source | Admitting: Family Medicine

## 2023-12-23 ENCOUNTER — Encounter: Payer: Self-pay | Admitting: Family Medicine

## 2023-12-23 VITALS — BP 116/61 | HR 84 | Ht 69.0 in | Wt 225.0 lb

## 2023-12-23 DIAGNOSIS — Z7689 Persons encountering health services in other specified circumstances: Secondary | ICD-10-CM | POA: Diagnosis not present

## 2023-12-23 DIAGNOSIS — I10 Essential (primary) hypertension: Secondary | ICD-10-CM | POA: Diagnosis not present

## 2023-12-23 DIAGNOSIS — M545 Low back pain, unspecified: Secondary | ICD-10-CM | POA: Diagnosis not present

## 2023-12-23 DIAGNOSIS — G8929 Other chronic pain: Secondary | ICD-10-CM | POA: Diagnosis not present

## 2023-12-23 DIAGNOSIS — E1142 Type 2 diabetes mellitus with diabetic polyneuropathy: Secondary | ICD-10-CM | POA: Diagnosis not present

## 2023-12-23 DIAGNOSIS — R58 Hemorrhage, not elsewhere classified: Secondary | ICD-10-CM

## 2023-12-23 DIAGNOSIS — B372 Candidiasis of skin and nail: Secondary | ICD-10-CM

## 2023-12-23 DIAGNOSIS — I5022 Chronic systolic (congestive) heart failure: Secondary | ICD-10-CM | POA: Diagnosis not present

## 2023-12-23 DIAGNOSIS — N184 Chronic kidney disease, stage 4 (severe): Secondary | ICD-10-CM | POA: Diagnosis not present

## 2023-12-23 MED ORDER — TRULICITY 1.5 MG/0.5ML ~~LOC~~ SOAJ: 1.5000 mg | SUBCUTANEOUS | 0 refills | Status: DC

## 2023-12-23 MED ORDER — CLOTRIMAZOLE 1 % EX CREA: 1.0000 | TOPICAL_CREAM | Freq: Two times a day (BID) | CUTANEOUS | 0 refills | Status: DC

## 2023-12-23 MED ORDER — LIDOCAINE 5 % EX PTCH: 1.0000 | MEDICATED_PATCH | CUTANEOUS | 0 refills | Status: DC

## 2023-12-23 NOTE — Telephone Encounter (Signed)
-  confirmed pt did received message to take metolazone  Reports she has appt with pcp today at 230 -will call back to arrange fu with [facility]

## 2023-12-23 NOTE — Assessment & Plan Note (Signed)
 A1c is 6.7.  Discussed that this is at goal for her age.  Given patient's anxiety around intermittent hyperglycemia and overall low risk of significant hypoglycemia, will increase Trulicity  to 1.5 mg weekly.

## 2023-12-23 NOTE — Assessment & Plan Note (Signed)
 Recently with AKI on CKD on previous CMP.  Will recheck BMP today.

## 2023-12-23 NOTE — Assessment & Plan Note (Addendum)
 In the setting of chronic pain and suboptimal sleep hygiene.  Discussed pain management for shoulder and back pain with tramadol  and lidocaine  patches.  Also discussed refraining from screen use at least 1 hour before bed.  Would like to avoid pharmacologic therapy given patient's polypharmacy and age.  Follow-up as needed.

## 2023-12-23 NOTE — Assessment & Plan Note (Signed)
 Continue tramadol .  Also sent in lidocaine  patches given help in the past.  Reassured by neuro exam today.  Follow-up as needed.

## 2023-12-23 NOTE — Assessment & Plan Note (Signed)
 Controlled.

## 2023-12-23 NOTE — Assessment & Plan Note (Signed)
 As appreciated on exam.  Sent in Lotrimin  cream to be applied until resolution.  Follow-up as needed.

## 2023-12-23 NOTE — Assessment & Plan Note (Signed)
 Bleeding reassuringly has resolved, for now.  She remains on Megace  and Eliquis .  Given recent bleeding early last week, will repeat CBC to ensure stability.  Will also send in referral for gynecology given continued problem in setting of benign endometrial polyps for further evaluation.

## 2023-12-23 NOTE — Assessment & Plan Note (Signed)
 Bilateral lower extremity edema improved after 1 dose of metolazone  with added potassium supplementation.  She is wearing her compression stockings today.  Continue torsemide  80 mg twice daily.

## 2023-12-23 NOTE — Progress Notes (Signed)
SUBJECTIVE:   CHIEF COMPLAINT / HPI:   Edema of the legs with blisters Have been taking metolazone and torsemide. Has been taking extra potassium with this after talking with the cardiologist. Has lost 20 pounds of weight since taking these. Blisters on the legs are now scabbed over without new one.  Vaginal bleeding Has not spotted since Thursday or Friday of last week. Still taking megace and eliquis BID. Biopsy demonstrated benign polyps but was a scanty specimen.  Sleeping issues Since getting her endometrial biopsy. She states she also has more pain in her shoulders as below. She watches a lot of TV throughout the day and night. Has to get up a lot during the night to urinate.  Shoulder, back pain Still taking tramadol mostly Q12 with tylenol in between. Did have back pain that was different in 2019 when she was diagnosed with osteomyelitis of L2/L3 on MRI; she completed 6 weeks of abx with PICC line.  She has used lidocaine patches on her back which has helped in the past.  Hyperglycemia in the setting of type 2 diabetes CBG 230s this morning. Have been intermittently in the 170s as well since the procedure.  Bottom pain Has started again. Has been putting zinc oxide on the area.  OBJECTIVE:   BP 116/61   Pulse 84   Ht 5\' 9"  (1.753 m)   Wt 225 lb (102.1 kg)   LMP  (LMP Unknown)   SpO2 100%   BMI 33.23 kg/m   General: Alert and oriented, in NAD Skin: Warm, dry, and intact; moderate erythema and tenderness without active vesicles/skin breakdown in the gluteal folds HEENT: NCAT, EOM grossly normal, midline nasal septum Respiratory: Breathing and speaking comfortably on RA Extremities: Moves all extremities grossly equally Neurological: No gross focal deficit, bilateral lower extremity strength and sensation normal, ambulation at patient baseline Psychiatric: Appropriate mood and affect   ASSESSMENT/PLAN:   Hypertension Controlled.  Chronic systolic heart failure  (HCC) Bilateral lower extremity edema improved after 1 dose of metolazone with added potassium supplementation.  She is wearing her compression stockings today.  Continue torsemide 80 mg twice daily.  DM type 2 with diabetic peripheral neuropathy (HCC) A1c is 6.7.  Discussed that this is at goal for her age.  Given patient's anxiety around intermittent hyperglycemia and overall low risk of significant hypoglycemia, will increase Trulicity to 1.5 mg weekly.  Sleep concern In the setting of chronic pain and suboptimal sleep hygiene.  Discussed pain management for shoulder and back pain with tramadol and lidocaine patches.  Also discussed refraining from screen use at least 1 hour before bed.  Would like to avoid pharmacologic therapy given patient's polypharmacy and age.  Follow-up as needed.  Chronic lower back and bilateral shoulder pain Continue tramadol.  Also sent in lidocaine patches given help in the past.  Reassured by neuro exam today.  Follow-up as needed.  Candidal intertrigo As appreciated on exam.  Sent in Lotrimin cream to be applied until resolution.  Follow-up as needed.  Bleeding Vaginal bleeding reassuringly has resolved, for now.  She remains on Megace and Eliquis.  Given recent bleeding early last week, will repeat CBC to ensure stability.  Will also send in referral for gynecology given continued problem in setting of benign endometrial polyps for further evaluation.  CKD (chronic kidney disease) stage 4, GFR 15-29 ml/min (HCC)  Recently with AKI on CKD on previous CMP.  Will recheck BMP today.  Janeal Holmes, MD [facility]

## 2023-12-23 NOTE — Patient Instructions (Signed)
 We increased your trulicity  dosing for your blood sugar. Try to be off all screens for 1 hour before bed. Try to wind down with some warm tea before bed also. Continue your torsemide  and STOP metolazone . Let me know if swelling comes back. I have sent in a referral for vaginal bleeding to gynecology. I have sent in lidocaine  patches for your lower back. Let me know if this does not help with the tramadol . I have sent in lotrimin  cream to apply to your bottom.

## 2023-12-24 ENCOUNTER — Other Ambulatory Visit: Payer: Self-pay | Admitting: *Deleted

## 2023-12-24 LAB — CBC WITH DIFFERENTIAL/PLATELET
Basophils Absolute: 0 10*3/uL (ref 0.0–0.2)
Basos: 0 %
EOS (ABSOLUTE): 0.2 10*3/uL (ref 0.0–0.4)
Eos: 3 %
Hematocrit: 34 % (ref 34.0–46.6)
Hemoglobin: 10.7 g/dL — ABNORMAL LOW (ref 11.1–15.9)
Immature Grans (Abs): 0 10*3/uL (ref 0.0–0.1)
Immature Granulocytes: 0 %
Lymphocytes Absolute: 1.6 10*3/uL (ref 0.7–3.1)
Lymphs: 25 %
MCH: 29.3 pg (ref 26.6–33.0)
MCHC: 31.5 g/dL (ref 31.5–35.7)
MCV: 93 fL (ref 79–97)
Monocytes Absolute: 1 10*3/uL — ABNORMAL HIGH (ref 0.1–0.9)
Monocytes: 15 %
Neutrophils Absolute: 3.7 10*3/uL (ref 1.4–7.0)
Neutrophils: 57 %
Platelets: 183 10*3/uL (ref 150–450)
RBC: 3.65 x10E6/uL — ABNORMAL LOW (ref 3.77–5.28)
RDW: 13.3 % (ref 11.7–15.4)
WBC: 6.5 10*3/uL (ref 3.4–10.8)

## 2023-12-24 LAB — BASIC METABOLIC PANEL
BUN/Creatinine Ratio: 35 — ABNORMAL HIGH (ref 12–28)
BUN: 74 mg/dL — ABNORMAL HIGH (ref 8–27)
CO2: 22 mmol/L (ref 20–29)
Calcium: 10.6 mg/dL — ABNORMAL HIGH (ref 8.7–10.3)
Chloride: 97 mmol/L (ref 96–106)
Creatinine, Ser: 2.12 mg/dL — ABNORMAL HIGH (ref 0.57–1.00)
Glucose: 186 mg/dL — ABNORMAL HIGH (ref 70–99)
Potassium: 3.9 mmol/L (ref 3.5–5.2)
Sodium: 138 mmol/L (ref 134–144)
eGFR: 23 mL/min/{1.73_m2} — ABNORMAL LOW (ref >59)

## 2023-12-24 NOTE — Patient Instructions (Signed)
Visit Information  Thank you for taking time to visit with me today. Please don't hesitate to contact me if I can be of assistance to you before our next scheduled telephone appointment.  Following are the goals we discussed today:   Goals Addressed             This Visit's Progress    RNCM Care Management Expected Outcome: Monitor, Self-Manage and Reduce Symptoms of: Hypertension, DM, Osteoporosis       Current Barriers:  Knowledge Deficits related to plan of care for management of HTN, DMII, and Osteoporosis  Chronic Disease Management support and education needs related to HTN, DMII, and Osteoporosis   RNCM Clinical Goal(s):  Patient will verbalize basic understanding of  HTN, DMII, and Osteoporosis disease process and self health management plan as evidenced by verbal explanation, recognizing symptoms, lifestyle modifications to include maintaining a heart healthy/diabetic diet, remaining free of falls and continuing home exercises provided by PT as tolerated take all medications exactly as prescribed and will call provider for medication related questions as evidenced by compliance to all medications and monitoring for any side effects and notifying primary care provider, if any should arise.  attend all scheduled medical appointments: with primary care provider and specialist as evidenced by keeping all scheduled appointments demonstrate Improved and Ongoing adherence to prescribed treatment plan for HTN, DMII, and Osteoporosis as evidenced by consistent medication compliance, symptom monitoring, continued self-managment continue to work with RN Care Manager to address care management and care coordination needs related to  HTN, DMII, and Osteoporosis as evidenced by adherence to CM Team Scheduled appointments through collaboration with RN Care manager, provider, and care team.   Interventions: Evaluation of current treatment plan related to  self management and patient's adherence to  plan as established by provider   Diabetes Interventions:  (Status:  Goal on track:  Yes.) Long Term Goal Assessed patient's understanding of A1c goal: <6.5% Provided education to patient about basic DM disease process. A1C with a slight upward trend, medication adjustments made. Fasting blood sugar this morning was 170. Highest: 200s.  Reports that she gets meals on wheels that consist primarily of chicken and vegetables. Reviewed medications with patient and discussed importance of medication adherence. Reports compliance with all medications Counseled on importance of regular laboratory monitoring as prescribed Discussed plans with patient for ongoing care management follow up and provided patient with direct contact information for care management team Reviewed scheduled/upcoming provider appointments including: Diabetic Foot Exam with Podiatry on 02-19-2024 Review of patient status, including review of consultants reports, relevant laboratory and other test results, and medications completed Screening for signs and symptoms of depression related to chronic disease state  Assessed social determinant of health barriers Lab Results  Component Value Date   HGBA1C 6.7 12/19/2023    Hypertension Interventions:  (Status:  Goal on track:  Yes.) Long Term Goal Last practice recorded BP readings:  BP Readings from Last 3 Encounters:  12/23/23 116/61  12/19/23 127/68  12/12/23 (!) 108/94   Most recent eGFR/CrCl:  Lab Results  Component Value Date   EGFR 23 (L) 12/23/2023    No components found for: "CRCL"  Evaluation of current treatment plan related to hypertension self management and patient's adherence to plan as established by provider. Reports BP 140/67 this morning. PCA that helps with ADLs and assist patient in taking her blood pressure. She reports that had a new area develop on the opposite side and she was provided with cream for  that yesterday during her office visit  Provided  education to patient re: stroke prevention, s/s of heart attack and stroke. Education and support provided Reviewed medications with patient and discussed importance of compliance. Reports compliance with all medications Discussed plans with patient for ongoing care management follow up and provided patient with direct contact information for care management team Advised patient, providing education and rationale, to monitor blood pressure daily and record, calling PCP for findings outside established parameters Discussed complications of poorly controlled blood pressure such as heart disease, stroke, circulatory complications, vision complications, kidney impairment, sexual dysfunction Weighs 3x/week. States she was 210 lbs yesterday.  Osteoporosis Interventions:  (Status:  Goal on track:  Yes.) Long Term Goal Pain assessment performed. 8/10 shoulder pain reported this morning. Reviewed provider established plan for pain management. Reports compliance with her pain management plan Discussed importance of adherence to all scheduled medical appointments Counseled on the importance of reporting any/all new or changed pain symptoms or management strategies to pain management provider Advised patient to report to care team affect of pain on daily activities Discussed use of relaxation techniques and/or diversional activities to assist with pain reduction (distraction, imagery, relaxation, massage, acupressure, TENS, heat, and cold application   Anemia/Bleeding Interventions:  (Status:  New goal.) Short Term Goal Medications reviewed.  Counseled on bleeding risk associated with Eliquis and importance of self-monitoring for signs/symptoms of bleeding. Reports vaginal bleeding and she has been referred to gynecology. Counseled on avoidance of NSAIDs due to increased bleeding risk Counseled on importance of regular laboratory monitoring as directed by provider Provided education about signs and symptoms  of active bleeding such as stomach discomfort, coughing up blood or blood tinged secretions, bleeding from the gums/teeth, nosebleeds, increased bruising, blood in the urine/stool and/or if a traumatic injury occurs, regardless of severity of injury  Advised to call provider or 911 if active bleeding or signs and symptoms of active bleeding occur. RNCM reviewed with patient the importance of seeking help if her bleeding increases. Patient verbalizes full understanding and states she is trying to avoid the ER due to extended wait times. recommended promotion of rest and energy-conserving measures to manage fatigue, such as balancing activity with periods of rest encouraged strategies to prevent falls related to fatigue, weakness and dizziness; encouraged sitting before standing and using an assistive device. RNCM advised to continue to change positions slowly. encouraged optimal oral intake to support fluid balance and nutrition. Reports she eats meals on wheels. encouraged dietary changes to increase dietary intake of iron, Vitamin B12 and folic acid if advised/prescribed Screening for signs and symptoms of depression related to chronic disease state Assessed social determinant of health barriers  Lab Results  Component Value Date   WBC 6.5 12/23/2023   HGB 10.7 (L) 12/23/2023   HCT 34.0 12/23/2023   MCV 93 12/23/2023   PLT 183 12/23/2023     Patient Goals/Self-Care Activities: Take all medications as prescribed Attend all scheduled provider appointments Call pharmacy for medication refills 3-7 days in advance of running out of medications Call provider office for new concerns or questions  keep appointment with eye doctor check blood sugar at prescribed times: once daily and when you have symptoms of low or high blood sugar check feet daily for cuts, sores or redness enter blood sugar readings and medication or insulin into daily log keep a blood pressure log call doctor for signs and  symptoms of high blood pressure take medications for blood pressure exactly as prescribed report new  symptoms to your doctor  Follow Up Plan:  Telephone follow up appointment with care management team member scheduled for:  01-23-2024 at 9:00 am           Our next appointment is by telephone on 01-23-2024 at 9:00 am  Please call the care guide team at [phone] if you need to cancel or reschedule your appointment.   If you are experiencing a Mental Health or Behavioral Health Crisis or need someone to talk to, please call the Suicide and Crisis Lifeline: 988 call the [place] National Suicide Prevention Lifeline: 2288187631 or TTY: 218-134-6803 TTY 7544541405) to talk to a trained counselor call 1-800-273-TALK (toll free, 24 hour hotline)   The patient verbalized understanding of instructions, educational materials, and care plan provided today and DECLINED offer to receive copy of patient instructions, educational materials, and care plan.   Telephone follow up appointment with care management team member scheduled for:01-23-2024 at 9:00 am  Larey Brick, BSN RN [facility], [facility] RN Care Manager Direct Dial: [phone]  Fax: [phone]

## 2023-12-24 NOTE — Patient Outreach (Signed)
Care Management   Visit Note  12/24/2023 Name: [name] MRN: [id number] DOB: 07-28-1941  Subjective: [name] is a 83 y.o. year old female who is a primary care patient of Evette Georges, MD. The Care Management team was consulted for assistance.      Engaged with patient spoke with patient by telephone.    Goals Addressed             This Visit's Progress    RNCM Care Management Expected Outcome: Monitor, Self-Manage and Reduce Symptoms of: Hypertension, DM, Osteoporosis       Current Barriers:  Knowledge Deficits related to plan of care for management of HTN, DMII, and Osteoporosis  Chronic Disease Management support and education needs related to HTN, DMII, and Osteoporosis   RNCM Clinical Goal(s):  Patient will verbalize basic understanding of  HTN, DMII, and Osteoporosis disease process and self health management plan as evidenced by verbal explanation, recognizing symptoms, lifestyle modifications to include maintaining a heart healthy/diabetic diet, remaining free of falls and continuing home exercises provided by PT as tolerated take all medications exactly as prescribed and will call provider for medication related questions as evidenced by compliance to all medications and monitoring for any side effects and notifying primary care provider, if any should arise.  attend all scheduled medical appointments: with primary care provider and specialist as evidenced by keeping all scheduled appointments demonstrate Improved and Ongoing adherence to prescribed treatment plan for HTN, DMII, and Osteoporosis as evidenced by consistent medication compliance, symptom monitoring, continued self-managment continue to work with RN Care Manager to address care management and care coordination needs related to  HTN, DMII, and Osteoporosis as evidenced by adherence to CM Team Scheduled appointments through collaboration with RN Care manager, provider, and care team.    Interventions: Evaluation of current treatment plan related to  self management and patient's adherence to plan as established by provider   Diabetes Interventions:  (Status:  Goal on track:  Yes.) Long Term Goal Assessed patient's understanding of A1c goal: <6.5% Provided education to patient about basic DM disease process. A1C with a slight upward trend, medication adjustments made. Fasting blood sugar this morning was 170. Highest: 200s.  Reports that she gets meals on wheels that consist primarily of chicken and vegetables. Reviewed medications with patient and discussed importance of medication adherence. Reports compliance with all medications Counseled on importance of regular laboratory monitoring as prescribed Discussed plans with patient for ongoing care management follow up and provided patient with direct contact information for care management team Reviewed scheduled/upcoming provider appointments including: Diabetic Foot Exam with Podiatry on 02-19-2024 Review of patient status, including review of consultants reports, relevant laboratory and other test results, and medications completed Screening for signs and symptoms of depression related to chronic disease state  Assessed social determinant of health barriers Lab Results  Component Value Date   HGBA1C 6.7 12/19/2023    Hypertension Interventions:  (Status:  Goal on track:  Yes.) Long Term Goal Last practice recorded BP readings:  BP Readings from Last 3 Encounters:  12/23/23 116/61  12/19/23 127/68  12/12/23 (!) 108/94   Most recent eGFR/CrCl:  Lab Results  Component Value Date   EGFR 23 (L) 12/23/2023    No components found for: "CRCL"  Evaluation of current treatment plan related to hypertension self management and patient's adherence to plan as established by provider. Reports BP 140/67 this morning. PCA that helps with ADLs and assist patient in taking her blood pressure.  She reports that had a new area develop  on the opposite side and she was provided with cream for that yesterday during her office visit  Provided education to patient re: stroke prevention, s/s of heart attack and stroke. Education and support provided Reviewed medications with patient and discussed importance of compliance. Reports compliance with all medications Discussed plans with patient for ongoing care management follow up and provided patient with direct contact information for care management team Advised patient, providing education and rationale, to monitor blood pressure daily and record, calling PCP for findings outside established parameters Discussed complications of poorly controlled blood pressure such as heart disease, stroke, circulatory complications, vision complications, kidney impairment, sexual dysfunction Weighs 3x/week. States she was 210 lbs yesterday.  Osteoporosis Interventions:  (Status:  Goal on track:  Yes.) Long Term Goal Pain assessment performed. 8/10 shoulder pain reported this morning. Reviewed provider established plan for pain management. Reports compliance with her pain management plan Discussed importance of adherence to all scheduled medical appointments Counseled on the importance of reporting any/all new or changed pain symptoms or management strategies to pain management provider Advised patient to report to care team affect of pain on daily activities Discussed use of relaxation techniques and/or diversional activities to assist with pain reduction (distraction, imagery, relaxation, massage, acupressure, TENS, heat, and cold application   Anemia/Bleeding Interventions:  (Status:  New goal.) Short Term Goal Medications reviewed.  Counseled on bleeding risk associated with Eliquis and importance of self-monitoring for signs/symptoms of bleeding. Reports vaginal bleeding and she has been referred to gynecology. Counseled on avoidance of NSAIDs due to increased bleeding risk Counseled on  importance of regular laboratory monitoring as directed by provider Provided education about signs and symptoms of active bleeding such as stomach discomfort, coughing up blood or blood tinged secretions, bleeding from the gums/teeth, nosebleeds, increased bruising, blood in the urine/stool and/or if a traumatic injury occurs, regardless of severity of injury  Advised to call provider or 911 if active bleeding or signs and symptoms of active bleeding occur. RNCM reviewed with patient the importance of seeking help if her bleeding increases. Patient verbalizes full understanding and states she is trying to avoid the ER due to extended wait times. recommended promotion of rest and energy-conserving measures to manage fatigue, such as balancing activity with periods of rest encouraged strategies to prevent falls related to fatigue, weakness and dizziness; encouraged sitting before standing and using an assistive device. RNCM advised to continue to change positions slowly. encouraged optimal oral intake to support fluid balance and nutrition. Reports she eats meals on wheels. encouraged dietary changes to increase dietary intake of iron, Vitamin B12 and folic acid if advised/prescribed Screening for signs and symptoms of depression related to chronic disease state Assessed social determinant of health barriers  Lab Results  Component Value Date   WBC 6.5 12/23/2023   HGB 10.7 (L) 12/23/2023   HCT 34.0 12/23/2023   MCV 93 12/23/2023   PLT 183 12/23/2023     Patient Goals/Self-Care Activities: Take all medications as prescribed Attend all scheduled provider appointments Call pharmacy for medication refills 3-7 days in advance of running out of medications Call provider office for new concerns or questions  keep appointment with eye doctor check blood sugar at prescribed times: once daily and when you have symptoms of low or high blood sugar check feet daily for cuts, sores or redness enter blood  sugar readings and medication or insulin into daily log keep a blood pressure log call  doctor for signs and symptoms of high blood pressure take medications for blood pressure exactly as prescribed report new symptoms to your doctor  Follow Up Plan:  Telephone follow up appointment with care management team member scheduled for:  01-23-2024 at 9:00 am           Consent to Services:  Patient was given information about care management services, agreed to services, and gave verbal consent to participate.   Plan: Telephone follow up appointment with care management team member scheduled for:01-23-2024 at 9:00 am  Larey Brick, BSN RN [facility], [facility] RN Care Manager Direct Dial: [phone]  Fax: [phone]

## 2023-12-25 ENCOUNTER — Other Ambulatory Visit (HOSPITAL_COMMUNITY): Payer: Self-pay | Admitting: *Deleted

## 2023-12-25 ENCOUNTER — Telehealth: Payer: Self-pay | Admitting: Family Medicine

## 2023-12-25 DIAGNOSIS — M19011 Primary osteoarthritis, right shoulder: Secondary | ICD-10-CM

## 2023-12-25 DIAGNOSIS — I25810 Atherosclerosis of coronary artery bypass graft(s) without angina pectoris: Secondary | ICD-10-CM

## 2023-12-25 DIAGNOSIS — I6930 Unspecified sequelae of cerebral infarction: Secondary | ICD-10-CM

## 2023-12-25 DIAGNOSIS — I2510 Atherosclerotic heart disease of native coronary artery without angina pectoris: Secondary | ICD-10-CM

## 2023-12-25 DIAGNOSIS — I5022 Chronic systolic (congestive) heart failure: Secondary | ICD-10-CM

## 2023-12-25 DIAGNOSIS — I272 Pulmonary hypertension, unspecified: Secondary | ICD-10-CM

## 2023-12-25 DIAGNOSIS — N184 Chronic kidney disease, stage 4 (severe): Secondary | ICD-10-CM

## 2023-12-25 DIAGNOSIS — G4733 Obstructive sleep apnea (adult) (pediatric): Secondary | ICD-10-CM

## 2023-12-25 DIAGNOSIS — E1142 Type 2 diabetes mellitus with diabetic polyneuropathy: Secondary | ICD-10-CM

## 2023-12-25 DIAGNOSIS — I4891 Unspecified atrial fibrillation: Secondary | ICD-10-CM

## 2023-12-25 DIAGNOSIS — R5381 Other malaise: Secondary | ICD-10-CM

## 2023-12-25 MED ORDER — POTASSIUM CHLORIDE CRYS ER 20 MEQ PO TBCR
60.0000 meq | EXTENDED_RELEASE_TABLET | Freq: Two times a day (BID) | ORAL | 2 refills | Status: DC
Start: 2023-12-25 — End: 2024-01-07
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
## 2023-12-26 ENCOUNTER — Telehealth: Payer: Self-pay

## 2023-12-26 DIAGNOSIS — N184 Chronic kidney disease, stage 4 (severe): Secondary | ICD-10-CM

## 2023-12-26 NOTE — Telephone Encounter (Signed)
Referral to nephrology placed.  Please inform patient.  Thank you so much!

## 2023-12-26 NOTE — Addendum Note (Signed)
Addended by: Evette Georges B on: 12/26/2023 07:50 AM   Modules accepted: Orders

## 2023-12-26 NOTE — Telephone Encounter (Signed)
Patient calls nurse line in regards to Nephrologist.   She reports she called the office to schedule an apt as instructed by PCP.   However, she reports she needs a referral to Dr. Wyn Forster office before she will see her.   Will forward to PCP.

## 2023-12-26 NOTE — Telephone Encounter (Signed)
Patient contacted and aware of referral.   Patient advised to call their office to schedule if she does not hear from them in a ~1-2 weeks.   Patient agreed with plan.

## 2023-12-27 ENCOUNTER — Other Ambulatory Visit: Payer: Self-pay | Admitting: Family Medicine

## 2023-12-27 ENCOUNTER — Telehealth: Payer: Self-pay

## 2023-12-27 NOTE — Telephone Encounter (Signed)
Patient calls nurse line requesting assistance with medications.   She reports she was contacted by [facility], however she reports the call was "strange" and sounded more like a possible scam vs [facility].   I called [facility] for patient. They report all of her medications are on track for delivery as normal and there would have been no reason to reach out to patient.   Patient contacted and put at ease. Patient to call us if she receives any more calls regarding her medications.   She was appreciative of time.

## 2023-12-31 ENCOUNTER — Other Ambulatory Visit: Payer: Self-pay | Admitting: *Deleted

## 2023-12-31 ENCOUNTER — Encounter: Payer: No Typology Code available for payment source | Admitting: Licensed Clinical Social Worker

## 2023-12-31 NOTE — Patient Outreach (Signed)
  Care Management   Visit Note  12/31/2023 Name: [name] MRN: [id number] DOB: Mar 17, 1941  Subjective: [name] is a 83 y.o. year old female who is a primary care patient of Evette Georges, MD. The Care Management team was consulted for assistance.      Engaged with patient spoke with patient by telephone.   Assessment: Patient called stating that the wound to her bottom has gotten bigger and that she is having increased pain in her shoulders. She reports that she is not getting any sleep because of her diuretics. She reports that she has PCS provided by [employer] 3 times per week for 1 hour to assist with bathing and light housekeeping.  Interventions: Advised patient to follow up with provider. RNCM sent RN team/provider in basket message to request appointment to address wound the wound getting bigger and her increased pain. Patient could benefit from home health for skilled nursing for wound care.  Recommendations:     Consent to Services:  Patient was given information about care management services, agreed to services, and gave verbal consent to participate.   Plan: Follow up with provider re: wound/pain  Larey Brick, BSN RN [facility], [facility] RN Care Manager Direct Dial: [phone]  Fax: [phone]

## 2024-01-02 ENCOUNTER — Encounter (HOSPITAL_BASED_OUTPATIENT_CLINIC_OR_DEPARTMENT_OTHER): Payer: Self-pay | Admitting: General Surgery

## 2024-01-03 ENCOUNTER — Ambulatory Visit: Payer: Self-pay | Admitting: Licensed Clinical Social Worker

## 2024-01-03 NOTE — Patient Outreach (Signed)
Care Coordination   Initial Visit Note   01/03/2024 Name: [name] MRN: [id number] DOB: [birth year]  [name] is a 83 y.o. year old female who sees [name] Georges, MD for primary care. I spoke with  [name] by phone today.  What matters to the patients health and wellness today?  Pt request assist wound. Pt reports she need home health back in the home.     Goals Addressed             This Visit's Progress    Care Coordination       Assist pt with home health.         SDOH assessments and interventions completed:  Yes  SDOH Interventions Today    Flowsheet Row Most Recent Value  SDOH Interventions   Food Insecurity Interventions Intervention Not Indicated  Housing Interventions Intervention Not Indicated  Transportation Interventions Intervention Not Indicated  Utilities Interventions Intervention Not Indicated        Care Coordination Interventions:  Yes, provided  Interventions Today    Flowsheet Row Most Recent Value  Chronic Disease   Chronic disease during today's visit Other  [Skin wound]  General Interventions   General Interventions Discussed/Reviewed Level of Care  Level of Care Personal Care Services  [Pt is requesting assistance of home health to addresss wound.]       Follow up plan: Follow up call scheduled for 01/17/2024    Encounter Outcome:  Patient Visit Completed   [name] MSW, LCSW Licensed Clinical Social Worker  [facility], Population Health Direct Dial: [phone]  Fax: [phone]

## 2024-01-06 ENCOUNTER — Telehealth: Payer: Self-pay | Admitting: Family Medicine

## 2024-01-06 ENCOUNTER — Ambulatory Visit (INDEPENDENT_AMBULATORY_CARE_PROVIDER_SITE_OTHER): Payer: No Typology Code available for payment source | Admitting: Family Medicine

## 2024-01-06 ENCOUNTER — Encounter: Payer: Self-pay | Admitting: Family Medicine

## 2024-01-06 ENCOUNTER — Telehealth (HOSPITAL_COMMUNITY): Payer: Self-pay

## 2024-01-06 VITALS — BP 122/78 | HR 94 | Ht 69.0 in | Wt 222.6 lb

## 2024-01-06 DIAGNOSIS — G8929 Other chronic pain: Secondary | ICD-10-CM

## 2024-01-06 DIAGNOSIS — G894 Chronic pain syndrome: Secondary | ICD-10-CM

## 2024-01-06 DIAGNOSIS — I5022 Chronic systolic (congestive) heart failure: Secondary | ICD-10-CM | POA: Diagnosis not present

## 2024-01-06 DIAGNOSIS — B372 Candidiasis of skin and nail: Secondary | ICD-10-CM | POA: Diagnosis not present

## 2024-01-06 DIAGNOSIS — I2510 Atherosclerotic heart disease of native coronary artery without angina pectoris: Secondary | ICD-10-CM

## 2024-01-06 DIAGNOSIS — E1142 Type 2 diabetes mellitus with diabetic polyneuropathy: Secondary | ICD-10-CM

## 2024-01-06 DIAGNOSIS — M25512 Pain in left shoulder: Secondary | ICD-10-CM

## 2024-01-06 DIAGNOSIS — M25511 Pain in right shoulder: Secondary | ICD-10-CM | POA: Diagnosis not present

## 2024-01-06 DIAGNOSIS — I6930 Unspecified sequelae of cerebral infarction: Secondary | ICD-10-CM

## 2024-01-06 DIAGNOSIS — I25810 Atherosclerosis of coronary artery bypass graft(s) without angina pectoris: Secondary | ICD-10-CM

## 2024-01-06 MED ORDER — CLOTRIMAZOLE 1 % EX CREA: 1.0000 | TOPICAL_CREAM | Freq: Two times a day (BID) | CUTANEOUS | 1 refills | Status: DC

## 2024-01-06 NOTE — Progress Notes (Unsigned)
    SUBJECTIVE:   CHIEF COMPLAINT / HPI:   LE wound Fluid on legs. Had an area that just popped. Taking Torsemide 80mg  BID and Metolozone (taken once). Usually wears compression. Seeing Cardiology tomorrow. Taking potassium 60 meq BID.   Needs some compression.   Message referral coordinator - Nephrology and Gynecology.  Sore on gluteal region Out of lotrimin. Needs  Take the metolazone.   PERTINENT  PMH / PSH: ***  OBJECTIVE:   LMP  (LMP Unknown)  ***  General: NAD, pleasant, able to participate in exam Cardiac: RRR, no murmurs. Respiratory: CTAB, normal effort, No wheezes, rales or rhonchi Abdomen: Bowel sounds present, nontender, nondistended Extremities: no edema or cyanosis. Skin: warm and dry, no rashes noted Neuro: alert, no obvious focal deficits Psych: Normal affect and mood  ASSESSMENT/PLAN:   No problem-specific Assessment & Plan notes found for this encounter.     Dr. Elberta Fortis, DO [facility]    {    This will disappear when note is signed, click to select method of visit    :1}

## 2024-01-06 NOTE — Patient Instructions (Addendum)
 It was wonderful to see you today! Thank you for choosing [facility].   Please bring ALL of your medications with you to every visit.   Today we talked about:  Please continue to take your Torsemide 80mg  twice per day.  Please take a dose of the metolazone tonight and follow-up with the cardiologist tomorrow to discuss your peripheral edema and heart failure further.  Please take your potassium twice per day as prescribed as well. I refilled the Lotrimin cream, please use on your gluteal area as needed and you can use barrier cream as well.  Please try to keep the areas clean and dry as possible. I am referring you to sports medicine for your shoulder pain since orthopedic doctor did not follow-up.  Our office will call you with that information.  Please follow up in 2-3 weeks with PCP  If you haven't already, sign up for My Chart to have easy access to your labs results, and communication with your primary care physician.  Call the clinic at [phone] if your symptoms worsen or you have any concerns.  Please be sure to schedule follow up at the front desk before you leave today.   Elberta Fortis, DO Family Medicine

## 2024-01-06 NOTE — Telephone Encounter (Addendum)
 I have placed a new order for [facility] that takes patient's insurance, [employer]. Thank you!  ----- Message from [facility] Silas Flood sent at 01/06/2024  9:37 AM EST ----- Regarding: RE: Referral for home health Hey!  Monia Pouch with [facility] messaged me back. They did see her last year. She has changed insurance to [employer]. [facility] does not take [employer]. Please put in a new referral for [facility] and I will send it to [facility]. They ware the only service that I know for sure takes her insurance. Thank you, Melvenia Beam ----- Message ----- From: Evette Georges, MD Sent: 01/03/2024   7:41 PM EST To: Ricky Stabs, RN; Sunday Spillers, CMA; # Subject: RE: Referral for home health                   Hi,  Thanks for reaching out! I previously referred her for Home Health with Monia Pouch from [facility] contacting her. I wanted to see if this was still in progress? I have attached our referral coordinator as well.  Dr. Phineas Real ----- Message ----- From: Gwyndolyn Saxon, [place] Sent: 01/03/2024   3:40 PM EST To: Ricky Stabs, RN; Evette Georges, MD Subject: Referral for home health                       Good Afternoon   [name] is requesting assistance with a wound and is needing assistance from home health.     Gwyndolyn Saxon MSW, LCSW Licensed Clinical Social Worker  [facility], Population Health Direct Dial: [phone]  Fax: [phone]

## 2024-01-06 NOTE — Telephone Encounter (Signed)
 Called to confirm/remind patient of their appointment at [facility] on 01/07/24.   Patient reminded to bring all medications and/or complete list.  Confirmed patient has transportation. Gave directions, instructed to utilize valet parking.  Confirmed appointment prior to ending call.

## 2024-01-07 ENCOUNTER — Ambulatory Visit (HOSPITAL_COMMUNITY)
Admission: RE | Admit: 2024-01-07 | Discharge: 2024-01-07 | Disposition: A | Discharge status: Home / Self Care | Payer: No Typology Code available for payment source | Source: Ambulatory Visit | Attending: Family Medicine | Admitting: Family Medicine

## 2024-01-07 ENCOUNTER — Encounter (HOSPITAL_COMMUNITY): Payer: Self-pay

## 2024-01-07 VITALS — BP 100/64 | HR 93 | Ht 69.0 in | Wt 222.2 lb

## 2024-01-07 DIAGNOSIS — I2510 Atherosclerotic heart disease of native coronary artery without angina pectoris: Secondary | ICD-10-CM | POA: Insufficient documentation

## 2024-01-07 DIAGNOSIS — I5033 Acute on chronic diastolic (congestive) heart failure: Secondary | ICD-10-CM

## 2024-01-07 DIAGNOSIS — Z8673 Personal history of transient ischemic attack (TIA), and cerebral infarction without residual deficits: Secondary | ICD-10-CM | POA: Diagnosis not present

## 2024-01-07 DIAGNOSIS — E1122 Type 2 diabetes mellitus with diabetic chronic kidney disease: Secondary | ICD-10-CM | POA: Insufficient documentation

## 2024-01-07 DIAGNOSIS — M19012 Primary osteoarthritis, left shoulder: Secondary | ICD-10-CM | POA: Insufficient documentation

## 2024-01-07 DIAGNOSIS — I130 Hypertensive heart and chronic kidney disease with heart failure and stage 1 through stage 4 chronic kidney disease, or unspecified chronic kidney disease: Secondary | ICD-10-CM | POA: Diagnosis not present

## 2024-01-07 DIAGNOSIS — L89159 Pressure ulcer of sacral region, unspecified stage: Secondary | ICD-10-CM | POA: Diagnosis not present

## 2024-01-07 DIAGNOSIS — Z951 Presence of aortocoronary bypass graft: Secondary | ICD-10-CM | POA: Diagnosis not present

## 2024-01-07 DIAGNOSIS — Z79899 Other long term (current) drug therapy: Secondary | ICD-10-CM | POA: Insufficient documentation

## 2024-01-07 DIAGNOSIS — I10 Essential (primary) hypertension: Secondary | ICD-10-CM

## 2024-01-07 DIAGNOSIS — I272 Pulmonary hypertension, unspecified: Secondary | ICD-10-CM | POA: Insufficient documentation

## 2024-01-07 DIAGNOSIS — Z7901 Long term (current) use of anticoagulants: Secondary | ICD-10-CM | POA: Insufficient documentation

## 2024-01-07 DIAGNOSIS — Z8719 Personal history of other diseases of the digestive system: Secondary | ICD-10-CM | POA: Insufficient documentation

## 2024-01-07 DIAGNOSIS — I5023 Acute on chronic systolic (congestive) heart failure: Secondary | ICD-10-CM | POA: Diagnosis not present

## 2024-01-07 DIAGNOSIS — Z6832 Body mass index (BMI) 32.0-32.9, adult: Secondary | ICD-10-CM | POA: Insufficient documentation

## 2024-01-07 DIAGNOSIS — M19011 Primary osteoarthritis, right shoulder: Secondary | ICD-10-CM | POA: Diagnosis not present

## 2024-01-07 DIAGNOSIS — I4819 Other persistent atrial fibrillation: Secondary | ICD-10-CM | POA: Insufficient documentation

## 2024-01-07 DIAGNOSIS — R32 Unspecified urinary incontinence: Secondary | ICD-10-CM | POA: Diagnosis not present

## 2024-01-07 DIAGNOSIS — Z7985 Long-term (current) use of injectable non-insulin antidiabetic drugs: Secondary | ICD-10-CM | POA: Insufficient documentation

## 2024-01-07 DIAGNOSIS — G4733 Obstructive sleep apnea (adult) (pediatric): Secondary | ICD-10-CM | POA: Insufficient documentation

## 2024-01-07 DIAGNOSIS — N183 Chronic kidney disease, stage 3 unspecified: Secondary | ICD-10-CM | POA: Insufficient documentation

## 2024-01-07 DIAGNOSIS — I255 Ischemic cardiomyopathy: Secondary | ICD-10-CM | POA: Insufficient documentation

## 2024-01-07 MED ORDER — POTASSIUM CHLORIDE CRYS ER 20 MEQ PO TBCR: 60.0000 meq | EXTENDED_RELEASE_TABLET | Freq: Two times a day (BID) | ORAL | 2 refills | Status: DC

## 2024-01-07 MED ORDER — TORSEMIDE 100 MG PO TABS: 100.0000 mg | ORAL_TABLET | Freq: Two times a day (BID) | ORAL | 3 refills | Status: DC

## 2024-01-07 NOTE — Addendum Note (Signed)
 Encounter addended by: Levonne Spiller, RN on: 01/07/2024 2:37 PM  Actions taken: Clinical Note Signed

## 2024-01-07 NOTE — Progress Notes (Signed)
 ReDS Vest / Clip - 01/07/24 1113       ReDS Vest / Clip   Station Marker D    Ruler Value 33    ReDS Value Range Low volume    ReDS Actual Value 33

## 2024-01-07 NOTE — Patient Instructions (Signed)
 Stop Jardiance Take Furoscix 80 mg x 3 days along with 40 meq of potassium. DO NOT TAKE TORSEMIDE WHILE USING THE FUROCIX. After completing Furoscix start back on Torsemide 100 mg twice daily and Potassium 60 meq twice daily. ABI test ordered - will call you to schedule. Referral sent to Home Health for [employer] [facility] - please call us if you do not hear from them. Return to see Heart Failure APP in 1 - 2 weeks. See below. Please call us at [phone] if any questions or concerns prior to your next visit.    Your provider has order Furoscix for you. This is an on-body infuser that gives you a dose of Furosemide.   It will be shipped to your home   Furoscix Direct will call you to discuss before shipping so, PLEASE answer unknown calls  For questions regarding the device call Furoscix Direct at [phone]  Ensure you write down the time you start your infusion so that if there is a problem you will know how long the infusion lasted  Use Furoscix only AS DIRECTED by our office  Dosing Directions:   Day 1=80mg   Day 2=80 mg  Day 3=80 mg

## 2024-01-07 NOTE — Progress Notes (Signed)
 Provided patient with Furoscix sample:  80 mg - one LN [id number] Exp: 03/11/25

## 2024-01-07 NOTE — Progress Notes (Signed)
 ID:  [name], DOB 1941/08/01, MRN [id number]   Provider location: [facility] Type of Visit: Established patient   PCP:  Maury Dus, MD  Primary Cardiologist:  Armanda Magic, MD HF Cardiologist: Dr. Shirlee Latch   History of Present Illness: [name] is a 83 y.o. female who has a history of CAD status post CABG 2009, ischemic cardiomyopathy with chronic systolic CHF, persistent atrial fibrillation with stroke in 03/2016 when INR was subtherapeutic, stage III CKD, morbid obesity, pulmonary hypertension, diabetes, GI bleed 03/2017, and severe sleep apnea.    Seen in [facility] clinics 09/10/18 and 09/24/18, both times with volume overload and marked peripheral edema with skin breakdown. Torsemide increased at each visit. Echo in 7/19 showed EF 45-50% with mild RV dilation/mild RV systolic dysfunction and D-shaped interventricular septum.    AKI earlier in 7/20 in setting of excessive torsemide, she had increased dose to 60 qam/40 qpm on her own.  This was cut back to 40 mg daily and losartan and spironolactone were stopped.   Echo in 9/20 showed EF 45% with mildly decreased RV systolic function, PASP 46 mmHg with dilated IVC.  PYP scan in 10/21 was negative.   Echo in 4/22 showed EF 45% with diffuse hypokinesis, mildly decreased RV systolic function, PASP 47, IVC dilated, moderate TR, mild MR.   Echo 3/24 showed EF 50%, mildly decreased RV systolic function, dilated IVC, PASP 47 mmHg.   Today she returns for HF follow up with her son. She has had increase LE swelling x 2 weeks, with weeping. Has new sacral wound, PCP treating. She has urinary incontinence and wears depends. Legs too swollen to wear compression hose. She is physically limited by bilateral shoulder OA, but is more SOB walking with her rolling walker short distances. Denies palpitations, abnormal bleeding, CP, dizziness, or PND/Orthopnea. Not sleeping well. Not weighing at home. Taking all  medications.   ReDs reading: 33 %, normal  ECG (personally reviewed): none ordered today.  Labs (9/21): myeloma panel negative, urine immunofixation negative, LDL 50, K 4.6, creatinine 4.40 Labs (8/23): K 4.5, creatinine 1.92, hgb 12.1 Labs (12/23): BNP 85, K 4.5, creatinine 1.64 Labs (3/24): K 4.5, creatinine 1.87, hgb 12.1, LDL 48 Labs (4/24): K 4.4, creatinine 1.94, BNP 68 Labs (8/24): K 4.2, creatinine 1.93 Labs (2/25): K 3.9, creatinine 2.12  Review of systems complete and found to be negative unless listed in HPI.    Past Medical History 1. Chronic systolic CHF: Ischemic cardiomyopathy with prominent RV failure.   - Echo (11/18): LVEF 35-40%, severe RV dilation, severe RAE, severe TR.  - Echo (7/19): EF 45-50%, diffuse hypokinesis, mild RV dilation with mildly decreased RV systolic function, D-shaped interventricular septum suggestive of RV pressure/volume overload, mild MR, moderate TR, PASP 49 mmhg.  - Echo (9/20): EF 45%, mild LV dilation, mild RV dilation with mildly decreased systolic function, PASP 46 mmHg, IVC dilated.  - PYP scan (10/21): Negative.  - Echo (4/22): EF 45% with diffuse hypokinesis, mildly decreased RV systolic function, PASP 47, IVC dilated, moderate TR, mild MR. - Echo (3/24): EF 50%, mildly decreased RV systolic function, dilated IVC, PASP 47 mmHg.  2. CAD: CABG 2009. No angiography since that time.   3. HTN 4. Atrial fibrillation: Chronic since 2013.  5. CKD stage 3 6. Morbid obesity 7. OSA 8. Chronic venous stasis with RLE wound/Bullae 9. CVA 5/18.   Current Outpatient Medications  Medication Sig Dispense Refill  acetaminophen (TYLENOL 8 HOUR) 650 MG CR tablet Take 1 tablet (650 mg total) by mouth every 8 (eight) hours as needed for pain. 30 tablet 0   apixaban (ELIQUIS) 2.5 MG TABS tablet Take 1 tablet (2.5 mg total) by mouth 2 (two) times daily. 180 tablet 3   atorvastatin (LIPITOR) 40 MG tablet Take 1 tablet (40 mg total) by mouth daily. 100  tablet 2   Blood Glucose Monitoring Suppl (ONE TOUCH ULTRA 2) w/Device KIT USE TO CHECK BLOOD SUGAR UPTO 3 TIMES A DAY 1 kit 0   carvedilol (COREG) 6.25 MG tablet Take 1 tablet (6.25 mg total) by mouth 2 (two) times daily with a meal. 180 tablet 3   Cholecalciferol 25 MCG (1000 UT) tablet Take 1,000 Units by mouth daily.     clotrimazole (LOTRIMIN AF) 1 % cream Apply 1 Application topically 2 (two) times daily. 113 g 1   diclofenac Sodium (VOLTAREN) 1 % GEL Apply 2 g topically as needed. 100 g 1   docusate sodium (COLACE) 100 MG capsule Take 100 mg by mouth at bedtime.     Dulaglutide (TRULICITY) 1.5 MG/0.5ML SOAJ Inject 1.5 mg into the skin once a week. 2 mL 0   ferrous sulfate 325 (65 FE) MG tablet TAKE 1 TABLET BY MOUTH EVERY DAY WITH BREAKFAST 90 tablet 1   glucose blood (ONE TOUCH ULTRA TEST) test strip Use three times a day 100 each 3   glucose blood test strip Use as instructed to check blood glucose up to 4x daily 100 each 12   Lancets (ONETOUCH DELICA PLUS LANCET33G) MISC Please use to check blood sugar up to four times daily. 100 each 6   lidocaine (LIDODERM) 5 % Place 1 patch onto the skin daily. Remove & Discard patch within 12 hours or as directed by MD 30 patch 0   megestrol (MEGACE) 40 MG tablet Take 1 tablet by mouth daily. 30 tablet 0   metolazone (ZAROXOLYN) 2.5 MG tablet Take only as directed by CHF clinic 10 tablet 0   Multiple Vitamins-Minerals (PRESERVISION AREDS 2+MULTI VIT PO) Take 1 capsule by mouth in the morning and at bedtime.     rOPINIRole (REQUIP) 0.5 MG tablet Take 2 tablets (1 mg total) by mouth at bedtime. 180 tablet 3   spironolactone (ALDACTONE) 25 MG tablet Take 0.5 tablets (12.5 mg total) by mouth daily. 45 tablet 3   traMADol (ULTRAM) 50 MG tablet Take 1 tablet (50 mg total) by mouth every 12 (twelve) hours as needed for severe pain (pain score 7-10). Can take every 6 hours occasionally for breakthrough pain. 60 tablet 3   vitamin B-12 (CYANOCOBALAMIN) 1000  MCG tablet Take 1 tablet (1,000 mcg total) by mouth daily. 90 tablet 1   zinc oxide 20 % ointment Apply 1 application topically as needed (buttocks irritation). 425 g 1   nitroGLYCERIN (NITROSTAT) 0.4 MG SL tablet Place 1 tablet (0.4 mg total) under the tongue every 5 (five) minutes as needed for chest pain (up to 3 doses). (Patient not taking: Reported on 01/07/2024) 15 tablet 5   potassium chloride SA (KLOR-CON M) 20 MEQ tablet Take 3 tablets (60 mEq total) by mouth 2 (two) times daily. 180 tablet 2   torsemide (DEMADEX) 100 MG tablet Take 1 tablet (100 mg total) by mouth 2 (two) times daily. 180 tablet 3   No current facility-administered medications for this encounter.   Allergies  Allergen Reactions   Benazepril Other (See Comments)    Unknown  reaction at age 64-65 - possibly dizziness   Cymbalta [Duloxetine Hcl]     Itch, Dry mouth    Ozempic (0.25 Or 0.5 Mg-Dose) [Semaglutide(0.25 Or 0.5mg -Dos)] Diarrhea    Gi intollerance   Tape     TOLERATES PAPER TAPE ONLY- due to thin skin. NOT ALLERGY PER PT   Angiotensin Receptor Blockers Other (See Comments)    Hypotension reaction   Bactrim [Sulfamethoxazole-Trimethoprim] Other (See Comments)    Unknown reaction   Fe-Succ-C-Thre-B12-Des Stomach Other (See Comments)    Unknown reaction  Ferocon?   Social History   Socioeconomic History   Marital status: Widowed    Spouse name: Not on file   Number of children: 2   Years of education: 13   Highest education level: Not on file  Occupational History    Comment: retired  Tobacco Use   Smoking status: Never    Passive exposure: Never   Smokeless tobacco: Never  Vaping Use   Vaping status: Never Used  Substance and Sexual Activity   Alcohol use: No   Drug use: Never   Sexual activity: Not Currently  Other Topics Concern   Not on file  Social History Narrative   Widowed   01/27/21 Lives with son,  in [place]   2 children   Not routinely exercising   Social Drivers of  Health   Financial Resource Strain: Low Risk  (05/06/2023)   Overall Financial Resource Strain (CARDIA)    Difficulty of Paying Living Expenses: Not hard at all  Food Insecurity: No Food Insecurity (01/03/2024)   Hunger Vital Sign    Worried About Running Out of Food in the Last Year: Never true    Ran Out of Food in the Last Year: Never true  Transportation Needs: No Transportation Needs (01/03/2024)   PRAPARE - Administrator, Civil Service (Medical): No    Lack of Transportation (Non-Medical): No  Physical Activity: Inactive (09/24/2023)   Exercise Vital Sign    Days of Exercise per Week: 0 days    Minutes of Exercise per Session: 0 min  Stress: No Stress Concern Present (05/06/2023)   [employer] of Occupational Health - Occupational Stress Questionnaire    Feeling of Stress : Not at all  Social Connections: Socially Isolated (09/24/2023)   Social Connection and Isolation Panel [NHANES]    Frequency of Communication with Friends and Family: More than three times a week    Frequency of Social Gatherings with Friends and Family: Once a week    Attends Religious Services: Never    Database administrator or Organizations: No    Attends Banker Meetings: Never    Marital Status: Widowed  Intimate Partner Violence: Not At Risk (01/03/2024)   Humiliation, Afraid, Rape, and Kick questionnaire    Fear of Current or Ex-Partner: No    Emotionally Abused: No    Physically Abused: No    Sexually Abused: No   Family History  Problem Relation Age of Onset   Clotting disorder Mother        Cerebral hemorrhage   Other Mother        cerebral hemorrhage   Emphysema Father        COD   Lung cancer Brother    Heart disease Neg Hx    BP 100/64   Pulse 93   Ht 5\' 9"  (1.753 m)   Wt 100.8 kg (222 lb 3.2 oz)   LMP  (LMP Unknown)  SpO2 100%   BMI 32.81 kg/m   Wt Readings from Last 3 Encounters:  01/07/24 100.8 kg (222 lb 3.2 oz)  01/06/24 101 kg (222 lb 9.6  oz)  12/23/23 102.1 kg (225 lb)    PHYSICAL EXAM:  General:  NAD. No resp difficulty, walked into clinic with RW, elderly HEENT: Normal Neck: Supple. JVP 10 Cor: Irregular rate & rhythm. No rubs, gallops or murmurs. Lungs: Clear, diminished RLL Abdomen: Soft, obese, nontender, nondistended.  Extremities: No cyanosis, clubbing, rash, 3+ BLE edema to knees, + weeping on lower egs Neuro: Alert & oriented x 3, moves all 4 extremities w/o difficulty. Affect pleasant.  ASSESSMENT & PLAN: 1. Acute on Chronic systolic CHF with prominent RV failure: Ischemic cardiomyopathy.  Echo in 7/19 showed LV EF 45-50% (improved) with mildly dilated/mildly dysfunction RV.  D-shaped septum suggested RV pressure/volume overload.  Echo in 9/20 showed EF 45%, mildly dysfunctional RV, PASP 46 mmHg with dilated IVC.  PYP scan negative and myeloma workup negative, no evidence for cardiac amyloidosis.  Echo in 4/22 showed EF 45% with diffuse hypokinesis, mildly decreased RV systolic function, PASP 47, IVC dilated, moderate TR, mild MR.  Echo 3/24 showed EF 50%, mildly decreased RV systolic function, dilated IVC, PASP 47 mmHg. Worse NYHA class III-IIIb, limited by chronic shoulder OA. She is volume overloaded on exam and weight up 11 lbs. Reds normal at 33%, so R>L HF. - Use Furoscix + 40 KCL daily x 3 days (hold torsemide while using Furoscix). Labs reviewed from 12/23/23 and are stable, K 3.19, SCr 2.12 - After 3 days, resume higher dose of torsemide at 100 mg bid + 60 KCL bid. - With urinary incontinence and non-healing sacral wound, stop Jardiance. - Arrange ABIs, then Unna boots. - Continue Coreg 6.25 mg bid.   - Continue spironolactone 12.5 mg daily.   - Recommended Cardiomems placement, unfortunately not approved with insurance.   - Update echo after she is diuresed.  2. CAD s/p CABG: No chest pain.  - Continue atorvastatin 40 mg daily. Good lipids in 3/24. Check lipids next visit. - No ASA with stable CAD on  Eliquis.  3. Atrial fibrillation: Chronic. Given long-term atrial fibrillation, she is unlikely to successfully cardiovert.  - Continue Eliquis 2.5 mg bid.  No bleeding issues. 4. CKD stage 3: As above, stopping Jardiance. - Most recent SCr 2.12. Will repeat BMET at close follow up in 1-2 weeks. 5. H/o CVA: Continue Eliquis.  No bleeding issues.  - No change. 6. OSA: She cannot tolerate CPAP.  - No change. 7. HTN: BP controlled.   - Continue meds as above  Follow up in 1-2 weeks with APP for fluid check (will need ECG and BMET).   Signed, Jacklynn Ganong, FNP  01/07/2024  [facility] [address] [facility] [place] [address] [phone] (office) [phone] (fax)   FUROSCIX prescribed  Patient viewed patient education video with QR code for Lenor Coffin code for FUROSCIX placed on AVS  Call FUROSCIX Direct at [phone] for questions regarding on body infuser.  Day 1  FUROSCIX 80 mg once daily  via on body infuser + KDUR 40  Day 2  FUROSCIX 80 mg once daily  via on body infuser+ KDUR 40  Day 3 FUROSCIX  80 mg once daily  via on body infuser+ KDUR 40  Hold torsemide while using Furoscix

## 2024-01-07 NOTE — Assessment & Plan Note (Signed)
 Notable peripheral edema with skin weeping consistent with likely mild exacerbation worsened by venous insufficiency.  Taking torsemide 80 mg twice daily, advised to take a dose of metolazone tonight and follow-up with cardiology tomorrow as scheduled.  Continue potassium twice daily.  No dyspnea or chest pain concerning for urgent diuresis.

## 2024-01-07 NOTE — Progress Notes (Signed)
Provided patient education on Furoscix using demo kits and Furoscix video, QR code provided on AVS for further viewing. Furoscix order form completed and signed by Jessica Milford, NP. Order form, ins info, & OV note all faxed into Furoscix Direct.  

## 2024-01-07 NOTE — Assessment & Plan Note (Signed)
 Improved with Lotrimin, small affected area on right inner gluteal cleft.   -Refill Lotrimin, supplement with barrier creams as needed

## 2024-01-11 ENCOUNTER — Telehealth: Payer: Self-pay | Admitting: Cardiology

## 2024-01-11 NOTE — Telephone Encounter (Signed)
 Patient called the answering service this AM for medication clarification. She had been seen by Prince Rome on 2/25, and was started on a 3 day course of Furoscix. Patient was told to stop jardiance. Today, she is asking if she should resume the jardiance now that she has finished her Furosix course. Per Jessica's note, "With urinary incontinence and non-healing sacral wound, stop Jardiance."   I reviewed these instructions with the patient and instructed her to remain off jardiance.   Jonita Albee, PA-C 01/11/2024 8:14 AM

## 2024-01-13 ENCOUNTER — Other Ambulatory Visit: Payer: Self-pay | Admitting: Family Medicine

## 2024-01-13 ENCOUNTER — Telehealth (HOSPITAL_COMMUNITY): Payer: Self-pay

## 2024-01-13 ENCOUNTER — Telehealth (HOSPITAL_COMMUNITY): Payer: Self-pay | Admitting: Licensed Clinical Social Worker

## 2024-01-13 NOTE — Telephone Encounter (Signed)
 H&V Care Navigation CSW Progress Note  Clinical Social Worker consulted to speak with pt regarding caregiver concerns.  Pt lives with son who assists her with things like housework, laundry, grocery shopping but states he has cancer and it is unclear if his health will allow him to continue assisting with these things.  Patient is independent in most daily ADLs but has a CNA through a local program called [facility] where she has someone come in 3 days a week for 1 hour to help with bathing and some other quick needs- says they are not able to increase hours for this program.  Says only other family in this area is a daughter who just had heart surgery and has other chronic medical conditions where she would be unable to help.  Makes too much for Medicaid.  Some concerns with transportation as son takes her to all appts.  States [facility] told her she was out of their service area.    CSW will assist in looking into options and call pt back to discuss.   SDOH Screenings   Food Insecurity: No Food Insecurity (01/03/2024)  Housing: Unknown (01/03/2024)  Transportation Needs: No Transportation Needs (01/03/2024)  Utilities: Not At Risk (01/03/2024)  Alcohol Screen: Low Risk  (10/23/2023)  Depression (PHQ2-9): Low Risk  (01/06/2024)  Financial Resource Strain: Low Risk  (05/06/2023)  Physical Activity: Inactive (09/24/2023)  Social Connections: Socially Isolated (09/24/2023)  Stress: No Stress Concern Present (05/06/2023)  Tobacco Use: Low Risk  (01/07/2024)  Health Literacy: Adequate Health Literacy (09/24/2023)

## 2024-01-13 NOTE — Telephone Encounter (Signed)
 Received call from patient who states that she was calling to check on referral to [facility] for her unna boots. Advised patient that per chart review- they are currently working on insurance approval and should be reaching out with information shortly. Advised her if she didn't hear anything soon to call back.    Patient also did furoscix x 3 days last week. Reports that she can't tell a huge difference. Does report her shortness of breath is better and reports that she has lost 7 pounds. Advised patient to let us know if she begins having any issues or symptoms patient verbalized understanding. Patient is scheduled to follow up with [place] next week on 3/13. Patient aware of appointment time and date.    Patient also reports issues with her caregiver/her son. Reports that she is unsure what she is going to do in regards to help going forward. Advised patient I would send a message to social worker to see if there are any additional resources. Patient verbalized understanding.

## 2024-01-14 ENCOUNTER — Telehealth: Payer: Self-pay

## 2024-01-14 ENCOUNTER — Ambulatory Visit (INDEPENDENT_AMBULATORY_CARE_PROVIDER_SITE_OTHER): Payer: No Typology Code available for payment source | Admitting: Family Medicine

## 2024-01-14 ENCOUNTER — Encounter: Payer: Self-pay | Admitting: Family Medicine

## 2024-01-14 VITALS — BP 118/48 | Ht 69.0 in | Wt 217.0 lb

## 2024-01-14 DIAGNOSIS — E1142 Type 2 diabetes mellitus with diabetic polyneuropathy: Secondary | ICD-10-CM | POA: Diagnosis not present

## 2024-01-14 DIAGNOSIS — M19011 Primary osteoarthritis, right shoulder: Secondary | ICD-10-CM | POA: Diagnosis not present

## 2024-01-14 DIAGNOSIS — I5022 Chronic systolic (congestive) heart failure: Secondary | ICD-10-CM | POA: Diagnosis not present

## 2024-01-14 DIAGNOSIS — N184 Chronic kidney disease, stage 4 (severe): Secondary | ICD-10-CM | POA: Diagnosis not present

## 2024-01-14 DIAGNOSIS — M19012 Primary osteoarthritis, left shoulder: Secondary | ICD-10-CM

## 2024-01-14 DIAGNOSIS — Z7901 Long term (current) use of anticoagulants: Secondary | ICD-10-CM

## 2024-01-14 NOTE — Assessment & Plan Note (Signed)
-   The patient has advanced osteoarthritis of the shoulders with complete loss of cartilage and severe degeneration.  There is also a complete rotator cuff involving the supraspinatus, infraspinatus, and likely subscapularis seen on CT scan. - Unfortunately the patient his been deemed not to be a surgical candidate. - Physical therapy helped minimally. - Unfortunately patient cannot receive steroid injections due to comorbidities - Viscosupplementation is not indicated given the advanced nature of the osteoarthritis - The patient is on Eliquis so PRP is not an option. - Unfortunately Toradol injection carries too much risk due to blood pressure and other comorbidities. - We discussed a combined use of the remaining topical therapies to be used with specific timing in combination for optimum pain relief. - Codman exercises given for gentle daily range of motion. - Follow-up with PCP for maximum medical management - The patient may be a candidate for pain management clinic.

## 2024-01-14 NOTE — Patient Instructions (Addendum)
 You have a complete rotator cuff tear in the right shoulder and end-stage osteoarthritis of the shoulders.  Unfortunately surgery is not an option because of the risk of anesthesia. Steroid injections are not an option because of the spike in blood sugar and ongoing, advanced ulcer of the lower extremity as well as high blood pressure. Given the advanced nature of the osteoarthritis in the joints, gel injections are not indicated. PRP is not an option because you are on Eliquis. We will approach this using a multifaceted topical pain management approach. You can use heat in the mornings for mobility and pain relief, and ice for 15-20 minutes in the evenings or when there is a pain flare. Continue topical Voltaren gel 2-3 times a day. You can also place lidocaine patches just above the shoulder joint on the muscles of the scapula for some pain relief. We have given you a printout with some gentle range of motion exercises to do every day to maintain mobility. Other options are using a TENS unit for muscular pain relief and/or acupuncture. The use of all of these combined therapies together may provide some maintenance improvement of pain. Follow-up with your primary care provider for management of medications including Tylenol and tramadol. You may want to ask about a referral to pain management as well.

## 2024-01-14 NOTE — Telephone Encounter (Signed)
 Pt is calling back

## 2024-01-14 NOTE — Progress Notes (Signed)
 [name] - 83 y.o. female MRN [id number]  Date of birth: [birth year]  PCP: [name] Georges, MD  Subjective:  No chief complaint on file. Bilateral shoulder pain  HPI: Past Medical, Surgical, Social, and Family History Reviewed & Updated per EMR.   Patient is a 83 y.o. female here for bilateral shoulder pain due to osteoarthritis and a complete rotator cuff tear on the right.  She previously went to [name] in the hopes of surgical repair but was deemed not to be a surgical candidate by her PCP due to extensive and multiple comorbidities.  Her last steroid injection did not help and suspect her blood sugars.  She has an open ulcer on the anterior portion of her left shin which she is being seen for in wound clinic.  She is also on extensive diuretic medications for heart failure and needs to get up 2 times an hour to use the bathroom.  Her shoulders have decreased range of motion due to pain.  She does not have any numbness in the hands.    Past Surgical History:  Procedure Laterality Date   APPENDECTOMY  1946   BACK SURGERY     CARPAL TUNNEL RELEASE Left 03/28/2021   Procedure: LEFT CARPAL TUNNEL RELEASE;  Surgeon: [name] Salt, MD;  Location: [facility] OR;  Service: Orthopedics;  Laterality: Left;  60 MIN   CARPAL TUNNEL RELEASE Right 09/15/2021   Procedure: RIGHT CARPAL TUNNEL RELEASE;  Surgeon: [name] Salt, MD;  Location: [facility] OR;  Service: Orthopedics;  Laterality: Right;   CATARACT EXTRACTION Right 2012   COLONOSCOPY WITH PROPOFOL Left 03/27/2017   Procedure: COLONOSCOPY WITH PROPOFOL;  Surgeon: [name] Salen, MD;  Location: [facility] ENDOSCOPY;  Service: Gastroenterology;  Laterality: Left;   CORONARY ANGIOPLASTY WITH STENT PLACEMENT  2009   "put 2 stents in"   EXCISION METACARPAL MASS Left 03/28/2021   Procedure: EXCISION MASS LEFT THUMB;  Surgeon: [name] Salt, MD;  Location: [facility] OR;  Service: Orthopedics;  Laterality: Left;   EYE SURGERY     bilateral cataract   HERNIA REPAIR     IR LUMBAR DISC  ASPIRATION W/IMG GUIDE  10/08/2018   JOINT REPLACEMENT     TEE WITH CARDIOVERSION     Postoperative in 2009;  S/P transesophageal echocardiography-guided cardioversion,   TOTAL KNEE ARTHROPLASTY Right 1994   VENTRAL HERNIA REPAIR  10/1999   With mesh    Allergies  Allergen Reactions   Benazepril Other (See Comments)    Unknown reaction at age 89-65 - possibly dizziness   Cymbalta [Duloxetine Hcl]     Itch, Dry mouth    Ozempic (0.25 Or 0.5 Mg-Dose) [Semaglutide(0.25 Or 0.5mg -Dos)] Diarrhea    Gi intollerance   Tape     TOLERATES PAPER TAPE ONLY- due to thin skin. NOT ALLERGY PER PT   Angiotensin Receptor Blockers Other (See Comments)    Hypotension reaction   Bactrim [Sulfamethoxazole-Trimethoprim] Other (See Comments)    Unknown reaction   Fe-Succ-C-Thre-B12-Des Stomach Other (See Comments)    Unknown reaction  Ferocon?        Objective:  Physical Exam: VS: BP:(!) 118/48  HR: bpm  TEMP: ( )  RESP:   HT:5\' 9"  (175.3 cm)   WT:217 lb (98.4 kg)  BMI:32.03  Gen: Well developed, NAD, speaks clearly, comfortable in exam room Respiratory: Normal work of breathing on room air, no respiratory distress Skin: No rashes, abrasions, or ecchymosis MSK:  Inspection of the shoulders bilaterally shows atrophy of the rotator cuff muscles There  is markedly decreased range of motion in all directions due to pain Crepitation noted bilaterally with minimal movement Mild tenderness to palpation of the lateral aspect of the shoulder on the right greater than left Unable to perform special tests due to pain Neuro: NVID   Assessment & Plan:   Osteoarthritis of both shoulders - The patient has advanced osteoarthritis of the shoulders with complete loss of cartilage and severe degeneration.  There is also a complete rotator cuff involving the supraspinatus, infraspinatus, and likely subscapularis seen on CT scan. - Unfortunately the patient his been deemed not to be a surgical candidate. -  Physical therapy helped minimally. - Unfortunately patient cannot receive steroid injections due to comorbidities - Viscosupplementation is not indicated given the advanced nature of the osteoarthritis - The patient is on Eliquis so PRP is not an option. - Unfortunately Toradol injection carries too much risk due to blood pressure and other comorbidities. - We discussed a combined use of the remaining topical therapies to be used with specific timing in combination for optimum pain relief. - Codman exercises given for gentle daily range of motion. - Follow-up with PCP for maximum medical management - The patient may be a candidate for pain management clinic.   [name] Mote MD [facility] Sports Medicine Fellow

## 2024-01-15 DIAGNOSIS — H17821 Peripheral opacity of cornea, right eye: Secondary | ICD-10-CM | POA: Diagnosis not present

## 2024-01-15 DIAGNOSIS — E113493 Type 2 diabetes mellitus with severe nonproliferative diabetic retinopathy without macular edema, bilateral: Secondary | ICD-10-CM | POA: Diagnosis not present

## 2024-01-15 DIAGNOSIS — H401132 Primary open-angle glaucoma, bilateral, moderate stage: Secondary | ICD-10-CM | POA: Diagnosis not present

## 2024-01-15 DIAGNOSIS — Z961 Presence of intraocular lens: Secondary | ICD-10-CM | POA: Diagnosis not present

## 2024-01-15 DIAGNOSIS — H43813 Vitreous degeneration, bilateral: Secondary | ICD-10-CM | POA: Diagnosis not present

## 2024-01-15 DIAGNOSIS — H353131 Nonexudative age-related macular degeneration, bilateral, early dry stage: Secondary | ICD-10-CM | POA: Diagnosis not present

## 2024-01-16 ENCOUNTER — Ambulatory Visit: Admitting: Family Medicine

## 2024-01-16 VITALS — BP 129/60 | HR 65 | Temp 97.7°F | Ht 69.0 in | Wt 221.0 lb

## 2024-01-16 DIAGNOSIS — S81802A Unspecified open wound, left lower leg, initial encounter: Secondary | ICD-10-CM

## 2024-01-16 NOTE — Patient Instructions (Addendum)
 Continue take all of your current medications and follow-up with your cardiologist on 3/13 and PCP 3/19  Keep the area clean.  You can use Vaseline over the area.  Continue to elevate your extremities  Please let us know if you see any surrounding redness of the skin, warmth of the skin, increasing pain, fevers, or pus drainage from the wound

## 2024-01-16 NOTE — Progress Notes (Addendum)
    SUBJECTIVE:   CHIEF COMPLAINT / HPI:   LLE wound Present x 2 week Started as a blister/bulla About 10 days ago popped with some fluid drainage and now has exposed skin No fevers Painful Normal sensation in foot  Also has smaller blister on R leg but not painful or open  F/u with cardiology 3/13 and PCP 3/19   PERTINENT  PMH / PSH: CAD s/p CABG 2009, systolic CHF, PAF, history of CVA 2017, stage III CKD, pulmonary hypertension, diabetes, sleep apnea  OBJECTIVE:   BP (!) 103/90   Pulse 65   Temp 97.7 F (36.5 C) (Oral)   Ht 5\' 9"  (1.753 m)   Wt 221 lb (100.2 kg)   LMP  (LMP Unknown)   SpO2 100%   BMI 32.64 kg/m   General: NAD, pleasant, able to participate in exam Respiratory: No respiratory distress Skin: Left lower extremity with wound as seen in image below.  Well-circumscribed, open wound with sloughed skin.  The surrounding area is not particularly erythematous.  Mildly tender to palpation.  NVI distally. RLE with roughly 3-4 cm skin colored bullous lesion, not draining any fluid with no wound     ASSESSMENT/PLAN:   Assessment & Plan Wound of left lower extremity, initial encounter Likely associated with her chronic venous stasis.  She has ABIs scheduled later this month as well as close follow-up with her cardiologist and PCP.  Do not feel that the area is actively infected so will hold off on antibiotics for now.  Continue home medications including diuretics.  Lower suspicion for SJS although this is a consideration given that she was prescribed Furoscix around the time that symptoms presented and it is a documented adverse effect of this medication.  Reassuringly she is now back to her previous diuretic and does not have other systemic symptoms.  Consider more detailed evaluation of her mucosal surfaces at next visit if rash is persistent/worsening. Supportive/wound care seems appropriate for now.  I did apply a Xeroform dressing with Kerlix and Ace wrap  today.  Advised patient to continue to use occlusive dressings and Vaseline.  Avoid compression.  Keep elevated if able.  Discussed return precautions for signs of infection or spreading of wound.   Vonna Drafts, MD [facility]

## 2024-01-17 ENCOUNTER — Telehealth (HOSPITAL_COMMUNITY): Payer: Self-pay | Admitting: Licensed Clinical Social Worker

## 2024-01-17 ENCOUNTER — Ambulatory Visit: Payer: Self-pay | Admitting: Licensed Clinical Social Worker

## 2024-01-17 NOTE — Patient Outreach (Signed)
 Care Coordination   Follow Up Visit Note   01/17/2024 Name: [name] MRN: [id number] DOB: 1941-11-01  [name] is a 83 y.o. year old female who sees Evette Georges, MD for primary care. I spoke with  [name] by phone today.  What matters to the patients health and wellness today?  Pt reports [facility] has contacted pt and currently collaborating with insurance company to begin services.     Goals Addressed             This Visit's Progress    COMPLETED: Care Coordination       Assist pt with home health.         SDOH assessments and interventions completed:  Yes  SDOH Interventions Today    Flowsheet Row Most Recent Value  SDOH Interventions   Food Insecurity Interventions Intervention Not Indicated  Housing Interventions Intervention Not Indicated  Transportation Interventions Intervention Not Indicated  [Pt reports son transports her to medical appts]  Utilities Interventions Intervention Not Indicated        Care Coordination Interventions:  Yes, provided  Interventions Today    Flowsheet Row Most Recent Value  General Interventions   Level of Care Personal Care Services  [[name] reports [facility] been in contact and is collaborating with insurance to begin services.]  Mental Health Interventions   Mental Health Discussed/Reviewed Mental Health Reviewed  [name] reports mood has improved since last contact and last visit to dr's office. [name] reports she does not have any further BH needs.]       Follow up plan: No further intervention required.   Encounter Outcome:  Patient Visit Completed   [name] MSW, LCSW Licensed Clinical Social Worker  [facility], Population Health Direct Dial: [phone]  Fax: [phone]

## 2024-01-17 NOTE — Patient Instructions (Signed)
 Visit Information  Thank you for taking time to visit with me today. Please don't hesitate to contact me if I can be of assistance to you.   Following are the goals we discussed today:   Goals Addressed             This Visit's Progress    COMPLETED: Care Coordination       Assist pt with home health.         Please call the care guide team at [phone] if you need to cancel or reschedule your appointment.   If you are experiencing a Mental Health or Behavioral Health Crisis or need someone to talk to, please call [phone]   Patient verbalizes understanding of instructions and care plan provided today and agrees to view in MyChart. Active MyChart status and patient understanding of how to access instructions and care plan via MyChart confirmed with patient.     The patient has been provided with contact information for the care management team and has been advised to call with any health related questions or concerns.   Gwyndolyn Saxon MSW, LCSW Licensed Clinical Social Worker  [facility], Population Health Direct Dial: [phone]  Fax: [phone]

## 2024-01-17 NOTE — Telephone Encounter (Signed)
 H&V Care Navigation CSW Progress Note  Clinical Social Worker spoke with case workers through [facility] [place] as well as [facility] to discuss local resources.  [facility] where pt gets aid services is only source of free aid services available- patient cannot afford private pay.   [facility] can transport patient to and from appts but only buses back and forth from [place] are early morning, lunch, and late afternoon so would need to stay at appt for several hours which would be difficult given patients mobility.  Pt insurance, [employer], does not have transport benefit- pt not willing to switch insurance providers.  Patient gets meals on wheels 1x day.  CSW spoke with [facility] CSW who has worked with patient and discussed eventual placement with patient who is not interested.  Unfortunately no additional resources identified that can assist pt further at home.  Will follow up with patient if any additional resources are identified.   SDOH Screenings   Food Insecurity: No Food Insecurity (01/03/2024)  Housing: Unknown (01/03/2024)  Transportation Needs: No Transportation Needs (01/03/2024)  Utilities: Not At Risk (01/03/2024)  Alcohol Screen: Low Risk  (10/23/2023)  Depression (PHQ2-9): Medium Risk (01/16/2024)  Financial Resource Strain: Low Risk  (05/06/2023)  Physical Activity: Inactive (09/24/2023)  Social Connections: Socially Isolated (09/24/2023)  Stress: No Stress Concern Present (05/06/2023)  Tobacco Use: Low Risk  (01/16/2024)  Health Literacy: Adequate Health Literacy (09/24/2023)   Burna Sis, LCSW Clinical Social Worker [facility] Desk#: [phone] Cell#: [phone]

## 2024-01-20 ENCOUNTER — Other Ambulatory Visit: Payer: Self-pay

## 2024-01-20 NOTE — Progress Notes (Signed)
 ID:  [name], DOB 02-Aug-1941, MRN [id number]   Provider location: [place] [facility] Type of Visit: Established patient   PCP:  Maury Dus, MD  Primary Cardiologist:  Armanda Magic, MD HF Cardiologist: Dr. Shirlee Latch   History of Present Illness: [name] is a 83 y.o. female who has a history of CAD status post CABG 2009, ischemic cardiomyopathy with chronic systolic CHF, persistent atrial fibrillation with stroke in 03/2016 when INR was subtherapeutic, stage III CKD, morbid obesity, pulmonary hypertension, diabetes, GI bleed 03/2017, and severe sleep apnea.    Seen in [facility] clinics 09/10/18 and 09/24/18, both times with volume overload and marked peripheral edema with skin breakdown. Torsemide increased at each visit. Echo in 7/19 showed EF 45-50% with mild RV dilation/mild RV systolic dysfunction and D-shaped interventricular septum.    AKI earlier in 7/20 in setting of excessive torsemide, she had increased dose to 60 qam/40 qpm on her own.  This was cut back to 40 mg daily and losartan and spironolactone were stopped.   Echo in 9/20 showed EF 45% with mildly decreased RV systolic function, PASP 46 mmHg with dilated IVC.  PYP scan in 10/21 was negative.   Echo in 4/22 showed EF 45% with diffuse hypokinesis, mildly decreased RV systolic function, PASP 47, IVC dilated, moderate TR, mild MR.   Echo 3/24 showed EF 50%, mildly decreased RV systolic function, dilated IVC, PASP 47 mmHg.   At AHF follow up 2/25 she was volume overloaded and was given 3 days of furoscix and increased diuretic regimen. Had a new sacral wound so SGLT2i was stopped.   Today she returns for AHF follow up with her son. Overall feeling wore out. Denies palpitations, CP, dizziness, or PND/Orthopnea. Has not been sleeping well as she has been taking her Torsemide at 6 am and 7 pm and constantly getting up to go to the bathroom. Has been a little confused recently with her lack of sleep. Has  BLE edema with some open blisters. SOB with activitiy. Able to ambulate with her walker. Appetite ok. No fever or chills. Drinks at least 64 oz of fluid during the day, sometimes more. Taking all medications.   ReDs reading: 20 > 32 %, normal but readings inconsistent  ECG (personally reviewed): a fib PVCs 71 bpm  Review of systems complete and found to be negative unless listed in HPI.    Past Medical History 1. Chronic systolic CHF: Ischemic cardiomyopathy with prominent RV failure.   - Echo (11/18): LVEF 35-40%, severe RV dilation, severe RAE, severe TR.  - Echo (7/19): EF 45-50%, diffuse hypokinesis, mild RV dilation with mildly decreased RV systolic function, D-shaped interventricular septum suggestive of RV pressure/volume overload, mild MR, moderate TR, PASP 49 mmhg.  - Echo (9/20): EF 45%, mild LV dilation, mild RV dilation with mildly decreased systolic function, PASP 46 mmHg, IVC dilated.  - Labs (9/21): myeloma panel negative, urine immunofixation negative - PYP scan (10/21): Negative.  - Echo (4/22): EF 45% with diffuse hypokinesis, mildly decreased RV systolic function, PASP 47, IVC dilated, moderate TR, mild MR. - Echo (3/24): EF 50%, mildly decreased RV systolic function, dilated IVC, PASP 47 mmHg.  2. CAD: CABG 2009. No angiography since that time.   3. HTN 4. Atrial fibrillation: Chronic since 2013.  5. CKD stage 3 6. Morbid obesity 7. OSA 8. Chronic venous stasis with RLE wound/Bullae 9. CVA 5/18.   Current Outpatient Medications  Medication Sig Dispense Refill  acetaminophen (TYLENOL 8 HOUR) 650 MG CR tablet Take 1 tablet (650 mg total) by mouth every 8 (eight) hours as needed for pain. 30 tablet 0   apixaban (ELIQUIS) 2.5 MG TABS tablet Take 1 tablet (2.5 mg total) by mouth 2 (two) times daily. 180 tablet 3   atorvastatin (LIPITOR) 40 MG tablet Take 1 tablet (40 mg total) by mouth daily. 100 tablet 2   Blood Glucose Monitoring Suppl (ONE TOUCH ULTRA 2) w/Device KIT  USE TO CHECK BLOOD SUGAR UPTO 3 TIMES A DAY 1 kit 0   Cholecalciferol 25 MCG (1000 UT) tablet Take 1,000 Units by mouth daily.     clotrimazole (LOTRIMIN AF) 1 % cream Apply 1 Application topically 2 (two) times daily. 113 g 1   diclofenac Sodium (VOLTAREN) 1 % GEL Apply 2 g topically as needed. 100 g 1   docusate sodium (COLACE) 100 MG capsule Take 100 mg by mouth at bedtime.     Dulaglutide (TRULICITY) 1.5 MG/0.5ML SOAJ Inject 1.5 mg into the skin once a week. 2 mL 0   ferrous sulfate 325 (65 FE) MG tablet TAKE 1 TABLET BY MOUTH EVERY DAY WITH BREAKFAST 90 tablet 1   glucose blood (ONE TOUCH ULTRA TEST) test strip Use three times a day 100 each 3   glucose blood test strip Use as instructed to check blood glucose up to 4x daily 100 each 12   Lancets (ONETOUCH DELICA PLUS LANCET33G) MISC Please use to check blood sugar up to four times daily. 100 each 6   lidocaine (LIDODERM) 5 % Place 1 patch onto the skin daily. Remove & Discard patch within 12 hours or as directed by MD 30 patch 0   megestrol (MEGACE) 40 MG tablet Take 1 tablet by mouth daily. 30 tablet 0   metolazone (ZAROXOLYN) 2.5 MG tablet Take only as directed by Heart Failure Clinic 6 tablet 1   Multiple Vitamins-Minerals (PRESERVISION AREDS 2+MULTI VIT PO) Take 1 capsule by mouth in the morning and at bedtime.     nitroGLYCERIN (NITROSTAT) 0.4 MG SL tablet Place 1 tablet (0.4 mg total) under the tongue every 5 (five) minutes as needed for chest pain (up to 3 doses). 15 tablet 5   potassium chloride SA (KLOR-CON M) 20 MEQ tablet Take 3 tablets (60 mEq total) by mouth 2 (two) times daily. 180 tablet 2   rOPINIRole (REQUIP) 0.5 MG tablet Take 2 tablets (1 mg total) by mouth at bedtime. 180 tablet 3   spironolactone (ALDACTONE) 25 MG tablet Take 0.5 tablets (12.5 mg total) by mouth daily. 45 tablet 3   torsemide (DEMADEX) 100 MG tablet Take 1 tablet (100 mg total) by mouth 2 (two) times daily. 180 tablet 3   traMADol (ULTRAM) 50 MG tablet  Take 1 tablet (50 mg total) by mouth every 12 (twelve) hours as needed for severe pain (pain score 7-10) (Can take every 6 hours for breakthrough pain.). 60 tablet 1   vitamin B-12 (CYANOCOBALAMIN) 1000 MCG tablet Take 1 tablet (1,000 mcg total) by mouth daily. 90 tablet 1   zinc oxide 20 % ointment Apply 1 application topically as needed (buttocks irritation). 425 g 1   carvedilol (COREG) 3.125 MG tablet Take 1 tablet (3.125 mg total) by mouth 2 (two) times daily with a meal. 60 tablet 11   metolazone (ZAROXOLYN) 2.5 MG tablet Take only as directed by CHF clinic 10 tablet 0   No current facility-administered medications for this encounter.   Allergies  Allergen  Reactions   Benazepril Other (See Comments)    Unknown reaction at age 28-65 - possibly dizziness   Cymbalta [Duloxetine Hcl]     Itch, Dry mouth    Ozempic (0.25 Or 0.5 Mg-Dose) [Semaglutide(0.25 Or 0.5mg -Dos)] Diarrhea    Gi intollerance   Tape     TOLERATES PAPER TAPE ONLY- due to thin skin. NOT ALLERGY PER PT   Angiotensin Receptor Blockers Other (See Comments)    Hypotension reaction   Bactrim [Sulfamethoxazole-Trimethoprim] Other (See Comments)    Unknown reaction   Fe-Succ-C-Thre-B12-Des Stomach Other (See Comments)    Unknown reaction  Ferocon?   Social History   Socioeconomic History   Marital status: Widowed    Spouse name: Not on file   Number of children: 2   Years of education: 26   Highest education level: Not on file  Occupational History    Comment: retired  Tobacco Use   Smoking status: Never    Passive exposure: Never   Smokeless tobacco: Never  Vaping Use   Vaping status: Never Used  Substance and Sexual Activity   Alcohol use: No   Drug use: Never   Sexual activity: Not Currently  Other Topics Concern   Not on file  Social History Narrative   Widowed   01/27/21 Lives with son,  in [place]   2 children   Not routinely exercising   Social Drivers of Health   Financial Resource  Strain: Low Risk  (05/06/2023)   Overall Financial Resource Strain (CARDIA)    Difficulty of Paying Living Expenses: Not hard at all  Food Insecurity: No Food Insecurity (01/17/2024)   Hunger Vital Sign    Worried About Running Out of Food in the Last Year: Never true    Ran Out of Food in the Last Year: Never true  Transportation Needs: No Transportation Needs (01/17/2024)   PRAPARE - Administrator, Civil Service (Medical): No    Lack of Transportation (Non-Medical): No  Physical Activity: Inactive (09/24/2023)   Exercise Vital Sign    Days of Exercise per Week: 0 days    Minutes of Exercise per Session: 0 min  Stress: No Stress Concern Present (05/06/2023)   [employer] of Occupational Health - Occupational Stress Questionnaire    Feeling of Stress : Not at all  Social Connections: Socially Isolated (09/24/2023)   Social Connection and Isolation Panel [NHANES]    Frequency of Communication with Friends and Family: More than three times a week    Frequency of Social Gatherings with Friends and Family: Once a week    Attends Religious Services: Never    Database administrator or Organizations: No    Attends Banker Meetings: Never    Marital Status: Widowed  Intimate Partner Violence: Not At Risk (01/17/2024)   Humiliation, Afraid, Rape, and Kick questionnaire    Fear of Current or Ex-Partner: No    Emotionally Abused: No    Physically Abused: No    Sexually Abused: No   Family History  Problem Relation Age of Onset   Clotting disorder Mother        Cerebral hemorrhage   Other Mother        cerebral hemorrhage   Emphysema Father        COD   Lung cancer Brother    Heart disease Neg Hx    BP 90/60   Pulse 62   Wt 100.5 kg (221 lb 9.6 oz)   LMP  (  LMP Unknown)   SpO2 100%   BMI 32.72 kg/m   Wt Readings from Last 3 Encounters:  01/23/24 100.5 kg (221 lb 9.6 oz)  01/16/24 100.2 kg (221 lb)  01/14/24 98.4 kg (217 lb)    PHYSICAL EXAM:   General:  frail/elderly appearing.  Walked into clinic with walker.  Neck: supple. JVD ~12 cm.  Cor: PMI nondisplaced. Regular rate & irregular rhythm. No rubs, gallops or murmurs. Lungs: clear Abdomen: obese Extremities: no cyanosis, clubbing, rash, +1-2 BLE edema. Open blister on R shin. LLE wrapped in ace bandage.  Neuro: alert & oriented x 3. Moves all 4 extremities w/o difficulty. Affect pleasant.   ASSESSMENT & PLAN: 1. Acute on chronic systolic CHF with prominent RV failure: Ischemic cardiomyopathy.  Echo in 7/19 showed LV EF 45-50% (improved) with mildly dilated/mildly dysfunction RV.  D-shaped septum suggested RV pressure/volume overload.  Echo in 9/20 showed EF 45%, mildly dysfunctional RV, PASP 46 mmHg with dilated IVC.  PYP scan negative and myeloma workup negative, no evidence for cardiac amyloidosis.  Echo in 4/22 showed EF 45% with diffuse hypokinesis, mildly decreased RV systolic function, PASP 47, IVC dilated, moderate TR, mild MR.  Echo 3/24 showed EF 50%, mildly decreased RV systolic function, dilated IVC, PASP 47 mmHg.  - NYHA class III-IIIb, limited by chronic shoulder OA. She is volume overloaded on exam and weight unchanged from last visit. Reds unreliable, readings WNL%, so R>L HF. - Used Furoscix last week and it briefly helped but weight came right back. Will do 2 days of metolazone this time (states she responds well to this). 2.5 mg metolazone + additional 40 mEq KDUR  - Encouraged to call us Monday if fluid is not coming off and we can adjust diuretics further or try Furoscix again (has 2 cartridges at home) prior to her being seen again next Thursday.  - Continue torsemide at 100 mg bid + 60 KCL bid.  - With urinary incontinence and non-healing sacral wound, now off Jardiance. - Needs to complete ABIs, then Unna boots. Discussed with RN who was able to call and get her seen for ABIs done after our appointment.  - With persistent volume overload and borderline BP  decrease Coreg 6.25>3.125 mg bid.   - Continue spironolactone 12.5 mg daily.   - Recommended Cardiomems placement, unfortunately not approved with insurance.   - Update echo after she is diuresed.  2. CAD s/p CABG: No chest pain.  - Continue atorvastatin 40 mg daily. Good lipids in 3/24. Update lipids today.  - No ASA with stable CAD on Eliquis.  3. Atrial fibrillation: Chronic. Given long-term atrial fibrillation, she is unlikely to successfully cardiovert.  - Continue Eliquis 2.5 mg bid.  No bleeding issues. CBC today.  4. CKD stage 3: As above, stopping Jardiance. - Most recent SCr 2.12. Labs today 5. H/o CVA: Continue Eliquis.  No bleeding issues.  - No change. 6. OSA: She cannot tolerate CPAP.  - No change. 7. HTN: BP controlled.   - Continue meds as above 8. Confusion - Has not been sleeping well and has been experiencing some day time confusion.  - Has been taking her diuretics at 6 am and 7 pm. Discussed that she can take the second dose around 2 pm instead so that she's not up all night going to the restroom.  - Today she is not confused and is able to tell me everything that she's on and the doses. Can consider paramedicine at next visit.  Follow up next week with APP/swing for fluid check.   Signed, Alen Bleacher, NP  01/23/2024  [facility] [address] [facility] [place] [address] [phone] (office) [phone] (fax)

## 2024-01-21 ENCOUNTER — Other Ambulatory Visit (HOSPITAL_COMMUNITY): Payer: Self-pay | Admitting: Cardiology

## 2024-01-21 MED ORDER — ATORVASTATIN CALCIUM 40 MG PO TABS: 40.0000 mg | ORAL_TABLET | Freq: Every day | ORAL | 2 refills | Status: DC

## 2024-01-23 ENCOUNTER — Other Ambulatory Visit (HOSPITAL_COMMUNITY): Payer: Self-pay | Admitting: *Deleted

## 2024-01-23 ENCOUNTER — Ambulatory Visit (HOSPITAL_BASED_OUTPATIENT_CLINIC_OR_DEPARTMENT_OTHER)
Admission: RE | Admit: 2024-01-23 | Discharge: 2024-01-23 | Disposition: A | Discharge status: Home / Self Care | Source: Ambulatory Visit | Attending: Internal Medicine | Admitting: Internal Medicine

## 2024-01-23 ENCOUNTER — Ambulatory Visit (HOSPITAL_COMMUNITY)
Admission: RE | Admit: 2024-01-23 | Discharge: 2024-01-23 | Disposition: A | Discharge status: Home / Self Care | Payer: No Typology Code available for payment source | Source: Ambulatory Visit | Attending: Internal Medicine | Admitting: Internal Medicine

## 2024-01-23 ENCOUNTER — Encounter (HOSPITAL_COMMUNITY)

## 2024-01-23 ENCOUNTER — Other Ambulatory Visit: Payer: Self-pay | Admitting: *Deleted

## 2024-01-23 ENCOUNTER — Encounter (HOSPITAL_COMMUNITY): Payer: Self-pay

## 2024-01-23 ENCOUNTER — Telehealth: Payer: Self-pay | Admitting: *Deleted

## 2024-01-23 VITALS — BP 104/77 | HR 62 | Wt 221.6 lb

## 2024-01-23 DIAGNOSIS — R410 Disorientation, unspecified: Secondary | ICD-10-CM | POA: Diagnosis not present

## 2024-01-23 DIAGNOSIS — I5023 Acute on chronic systolic (congestive) heart failure: Secondary | ICD-10-CM

## 2024-01-23 DIAGNOSIS — I878 Other specified disorders of veins: Secondary | ICD-10-CM | POA: Insufficient documentation

## 2024-01-23 DIAGNOSIS — Z8673 Personal history of transient ischemic attack (TIA), and cerebral infarction without residual deficits: Secondary | ICD-10-CM | POA: Diagnosis not present

## 2024-01-23 DIAGNOSIS — Z79899 Other long term (current) drug therapy: Secondary | ICD-10-CM | POA: Diagnosis not present

## 2024-01-23 DIAGNOSIS — Z7901 Long term (current) use of anticoagulants: Secondary | ICD-10-CM | POA: Diagnosis not present

## 2024-01-23 DIAGNOSIS — Z951 Presence of aortocoronary bypass graft: Secondary | ICD-10-CM | POA: Insufficient documentation

## 2024-01-23 DIAGNOSIS — R32 Unspecified urinary incontinence: Secondary | ICD-10-CM | POA: Diagnosis not present

## 2024-01-23 DIAGNOSIS — I272 Pulmonary hypertension, unspecified: Secondary | ICD-10-CM | POA: Diagnosis not present

## 2024-01-23 DIAGNOSIS — I5022 Chronic systolic (congestive) heart failure: Secondary | ICD-10-CM

## 2024-01-23 DIAGNOSIS — N183 Chronic kidney disease, stage 3 unspecified: Secondary | ICD-10-CM

## 2024-01-23 DIAGNOSIS — I130 Hypertensive heart and chronic kidney disease with heart failure and stage 1 through stage 4 chronic kidney disease, or unspecified chronic kidney disease: Secondary | ICD-10-CM | POA: Diagnosis not present

## 2024-01-23 DIAGNOSIS — I10 Essential (primary) hypertension: Secondary | ICD-10-CM

## 2024-01-23 DIAGNOSIS — I4819 Other persistent atrial fibrillation: Secondary | ICD-10-CM

## 2024-01-23 DIAGNOSIS — G4733 Obstructive sleep apnea (adult) (pediatric): Secondary | ICD-10-CM | POA: Diagnosis not present

## 2024-01-23 DIAGNOSIS — E1122 Type 2 diabetes mellitus with diabetic chronic kidney disease: Secondary | ICD-10-CM | POA: Insufficient documentation

## 2024-01-23 DIAGNOSIS — I255 Ischemic cardiomyopathy: Secondary | ICD-10-CM | POA: Insufficient documentation

## 2024-01-23 DIAGNOSIS — I2510 Atherosclerotic heart disease of native coronary artery without angina pectoris: Secondary | ICD-10-CM

## 2024-01-23 LAB — BASIC METABOLIC PANEL
Anion gap: 15 (ref 5–15)
BUN: 73 mg/dL — ABNORMAL HIGH (ref 8–23)
CO2: 18 mmol/L — ABNORMAL LOW (ref 22–32)
Calcium: 10.2 mg/dL (ref 8.9–10.3)
Chloride: 105 mmol/L (ref 98–111)
Creatinine, Ser: 2.07 mg/dL — ABNORMAL HIGH (ref 0.44–1.00)
GFR, Estimated: 23 mL/min — ABNORMAL LOW (ref >60)
Glucose, Bld: 227 mg/dL — ABNORMAL HIGH (ref 70–99)
Potassium: 4.6 mmol/L (ref 3.5–5.1)
Sodium: 138 mmol/L (ref 135–145)

## 2024-01-23 LAB — LIPID PANEL
Cholesterol: 77 mg/dL (ref 0–200)
HDL: 33 mg/dL — ABNORMAL LOW (ref >40)
LDL Cholesterol: 33 mg/dL (ref 0–99)
Total CHOL/HDL Ratio: 2.3 ratio
Triglycerides: 54 mg/dL (ref <150)
VLDL: 11 mg/dL (ref 0–40)

## 2024-01-23 LAB — CBC
HCT: 31.6 % — ABNORMAL LOW (ref 36.0–46.0)
Hemoglobin: 10.5 g/dL — ABNORMAL LOW (ref 12.0–15.0)
MCH: 30 pg (ref 26.0–34.0)
MCHC: 33.2 g/dL (ref 30.0–36.0)
MCV: 90.3 fL (ref 80.0–100.0)
Platelets: 193 10*3/uL (ref 150–400)
RBC: 3.5 MIL/uL — ABNORMAL LOW (ref 3.87–5.11)
RDW: 16 % — ABNORMAL HIGH (ref 11.5–15.5)
WBC: 6.3 10*3/uL (ref 4.0–10.5)
nRBC: 0 % (ref 0.0–0.2)

## 2024-01-23 LAB — BRAIN NATRIURETIC PEPTIDE: B Natriuretic Peptide: 109.6 pg/mL — ABNORMAL HIGH (ref 0.0–100.0)

## 2024-01-23 MED ORDER — CARVEDILOL 3.125 MG PO TABS: 3.1250 mg | ORAL_TABLET | Freq: Two times a day (BID) | ORAL | 11 refills | Status: DC

## 2024-01-23 MED ORDER — METOLAZONE 2.5 MG PO TABS
ORAL_TABLET | ORAL | 0 refills | Status: DC
Start: 2024-01-23 — End: 2024-01-23

## 2024-01-23 MED ORDER — METOLAZONE 2.5 MG PO TABS: ORAL_TABLET | ORAL | 1 refills | Status: DC

## 2024-01-23 NOTE — Addendum Note (Signed)
 Encounter addended by: Alen Bleacher, NP on: 01/23/2024 10:25 AM  Actions taken: Visit diagnoses modified, Diagnosis association updated

## 2024-01-23 NOTE — Progress Notes (Signed)
 ReDS Vest / Clip - 01/23/24 0900       ReDS Vest / Clip   Station Marker D    Ruler Value 32    ReDS Value Range Low volume    ReDS Actual Value 31

## 2024-01-23 NOTE — Addendum Note (Signed)
 Encounter addended by: Levonne Spiller, RN on: 01/23/2024 11:59 AM  Actions taken: Order list changed

## 2024-01-23 NOTE — Progress Notes (Signed)
*  PRELIMINARY RESULTS* Vascular Ultrasound ABI's has been completed.  Preliminary findings: Non compressible with monophasic flow  Laddie Aquas RVT [facility] 01/23/2024, 10:38 AM

## 2024-01-23 NOTE — Patient Instructions (Addendum)
 Decrease Coreg to 3.125 mg twice daily - new Rx sent. Take 2.5 mg metolazone today and tomorrow. Rx sent. Take an extra 40 meq of potassium on these two days. Labs today - will call you if abnormal. ABI arranged for today. Return to Heart Failure Clinic in one week - see below. Please call us at [phone] if any questions or concerns prior to your next visit.

## 2024-01-23 NOTE — Patient Outreach (Signed)
  Care Management   Follow Up Note   01/23/2024 Name: [name] MRN: [id number] DOB: 30-Dec-1940   Referred by: Evette Georges, MD Reason for referral : Care Management (RNCM: ATTEMPTED Follow Up For Chronic Disease Management & Care Coordination Needs )   An unsuccessful telephone outreach was attempted today. The patient was referred to the case management team for assistance with care management and care coordination.   Follow Up Plan: The care management team will reach out to the patient again over the next 30 days.   Larey Brick, BSN RN [facility], [facility] RN Care Manager Direct Dial: [phone]  Fax: [phone]

## 2024-01-27 NOTE — Telephone Encounter (Signed)
 Patient calls nurse line in regards to [facility] medications.   She reports she was contacted by them and was told there was an issue with one of her medications.   Patient called about this last month as well. I called [facility] and was unsuccessful to speak with a live representative.    Will attempt to call back at a later time.    [phone].

## 2024-01-28 ENCOUNTER — Telehealth: Payer: Self-pay | Admitting: Student

## 2024-01-28 NOTE — Telephone Encounter (Signed)
   Patient called after hours line requesting medication refill. Called and spoke with patient. She states she ran out of her Trulicity and is due to take this tomorrow. Her PCP (Dr. Phineas Real) is the one that has been prescribing this for her. She states she is scheduled to see Dr. Phineas Real tomorrow, so I recommended mentioning this to him at visit tomorrow. Patient voiced understanding and thanked me for calling.  Corrin Parker, PA-C 01/28/2024 6:03 PM

## 2024-01-29 ENCOUNTER — Emergency Department (HOSPITAL_COMMUNITY)

## 2024-01-29 ENCOUNTER — Inpatient Hospital Stay (HOSPITAL_COMMUNITY)
Admission: EM | Admit: 2024-01-29 | Discharge: 2024-02-07 | DRG: 871 | Disposition: A | Discharge status: Skilled Nursing Facility | Attending: Family Medicine | Admitting: Family Medicine

## 2024-01-29 ENCOUNTER — Ambulatory Visit (INDEPENDENT_AMBULATORY_CARE_PROVIDER_SITE_OTHER): Payer: No Typology Code available for payment source | Admitting: Family Medicine

## 2024-01-29 ENCOUNTER — Telehealth (HOSPITAL_COMMUNITY): Payer: Self-pay

## 2024-01-29 ENCOUNTER — Encounter (HOSPITAL_COMMUNITY): Payer: Self-pay | Admitting: Emergency Medicine

## 2024-01-29 VITALS — BP 136/72 | HR 78 | Ht 68.0 in | Wt 217.8 lb

## 2024-01-29 DIAGNOSIS — I130 Hypertensive heart and chronic kidney disease with heart failure and stage 1 through stage 4 chronic kidney disease, or unspecified chronic kidney disease: Secondary | ICD-10-CM | POA: Diagnosis present

## 2024-01-29 DIAGNOSIS — L03116 Cellulitis of left lower limb: Secondary | ICD-10-CM | POA: Diagnosis present

## 2024-01-29 DIAGNOSIS — M19071 Primary osteoarthritis, right ankle and foot: Secondary | ICD-10-CM | POA: Diagnosis not present

## 2024-01-29 DIAGNOSIS — A419 Sepsis, unspecified organism: Secondary | ICD-10-CM | POA: Diagnosis not present

## 2024-01-29 DIAGNOSIS — Z79899 Other long term (current) drug therapy: Secondary | ICD-10-CM

## 2024-01-29 DIAGNOSIS — M7989 Other specified soft tissue disorders: Secondary | ICD-10-CM | POA: Diagnosis present

## 2024-01-29 DIAGNOSIS — Z881 Allergy status to other antibiotic agents status: Secondary | ICD-10-CM

## 2024-01-29 DIAGNOSIS — N184 Chronic kidney disease, stage 4 (severe): Secondary | ICD-10-CM

## 2024-01-29 DIAGNOSIS — R29898 Other symptoms and signs involving the musculoskeletal system: Secondary | ICD-10-CM | POA: Diagnosis not present

## 2024-01-29 DIAGNOSIS — N939 Abnormal uterine and vaginal bleeding, unspecified: Secondary | ICD-10-CM

## 2024-01-29 DIAGNOSIS — N390 Urinary tract infection, site not specified: Secondary | ICD-10-CM | POA: Diagnosis not present

## 2024-01-29 DIAGNOSIS — I878 Other specified disorders of veins: Secondary | ICD-10-CM

## 2024-01-29 DIAGNOSIS — R531 Weakness: Secondary | ICD-10-CM | POA: Diagnosis not present

## 2024-01-29 DIAGNOSIS — I493 Ventricular premature depolarization: Secondary | ICD-10-CM

## 2024-01-29 DIAGNOSIS — I509 Heart failure, unspecified: Secondary | ICD-10-CM

## 2024-01-29 DIAGNOSIS — E785 Hyperlipidemia, unspecified: Secondary | ICD-10-CM | POA: Diagnosis present

## 2024-01-29 DIAGNOSIS — R609 Edema, unspecified: Secondary | ICD-10-CM | POA: Diagnosis not present

## 2024-01-29 DIAGNOSIS — B9620 Unspecified Escherichia coli [E. coli] as the cause of diseases classified elsewhere: Secondary | ICD-10-CM | POA: Diagnosis present

## 2024-01-29 DIAGNOSIS — Z7985 Long-term (current) use of injectable non-insulin antidiabetic drugs: Secondary | ICD-10-CM

## 2024-01-29 DIAGNOSIS — E113591 Type 2 diabetes mellitus with proliferative diabetic retinopathy without macular edema, right eye: Secondary | ICD-10-CM | POA: Diagnosis present

## 2024-01-29 DIAGNOSIS — I4891 Unspecified atrial fibrillation: Secondary | ICD-10-CM | POA: Diagnosis not present

## 2024-01-29 DIAGNOSIS — G894 Chronic pain syndrome: Secondary | ICD-10-CM | POA: Diagnosis present

## 2024-01-29 DIAGNOSIS — I4729 Other ventricular tachycardia: Secondary | ICD-10-CM

## 2024-01-29 DIAGNOSIS — I959 Hypotension, unspecified: Secondary | ICD-10-CM | POA: Diagnosis not present

## 2024-01-29 DIAGNOSIS — G9389 Other specified disorders of brain: Secondary | ICD-10-CM | POA: Diagnosis not present

## 2024-01-29 DIAGNOSIS — L304 Erythema intertrigo: Secondary | ICD-10-CM | POA: Diagnosis present

## 2024-01-29 DIAGNOSIS — I472 Ventricular tachycardia, unspecified: Secondary | ICD-10-CM | POA: Diagnosis present

## 2024-01-29 DIAGNOSIS — E66812 Obesity, class 2: Secondary | ICD-10-CM | POA: Diagnosis present

## 2024-01-29 DIAGNOSIS — I255 Ischemic cardiomyopathy: Secondary | ICD-10-CM | POA: Diagnosis present

## 2024-01-29 DIAGNOSIS — L8990 Pressure ulcer of unspecified site, unspecified stage: Secondary | ICD-10-CM | POA: Insufficient documentation

## 2024-01-29 DIAGNOSIS — I5043 Acute on chronic combined systolic (congestive) and diastolic (congestive) heart failure: Secondary | ICD-10-CM | POA: Diagnosis present

## 2024-01-29 DIAGNOSIS — Z888 Allergy status to other drugs, medicaments and biological substances status: Secondary | ICD-10-CM

## 2024-01-29 DIAGNOSIS — Z96651 Presence of right artificial knee joint: Secondary | ICD-10-CM | POA: Diagnosis present

## 2024-01-29 DIAGNOSIS — R799 Abnormal finding of blood chemistry, unspecified: Secondary | ICD-10-CM | POA: Insufficient documentation

## 2024-01-29 DIAGNOSIS — Z955 Presence of coronary angioplasty implant and graft: Secondary | ICD-10-CM

## 2024-01-29 DIAGNOSIS — I5021 Acute systolic (congestive) heart failure: Secondary | ICD-10-CM

## 2024-01-29 DIAGNOSIS — L89152 Pressure ulcer of sacral region, stage 2: Secondary | ICD-10-CM | POA: Diagnosis present

## 2024-01-29 DIAGNOSIS — R008 Other abnormalities of heart beat: Secondary | ICD-10-CM | POA: Diagnosis present

## 2024-01-29 DIAGNOSIS — D72829 Elevated white blood cell count, unspecified: Secondary | ICD-10-CM | POA: Diagnosis not present

## 2024-01-29 DIAGNOSIS — E1142 Type 2 diabetes mellitus with diabetic polyneuropathy: Secondary | ICD-10-CM

## 2024-01-29 DIAGNOSIS — I10 Essential (primary) hypertension: Secondary | ICD-10-CM | POA: Diagnosis not present

## 2024-01-29 DIAGNOSIS — R3915 Urgency of urination: Secondary | ICD-10-CM | POA: Diagnosis present

## 2024-01-29 DIAGNOSIS — K219 Gastro-esophageal reflux disease without esophagitis: Secondary | ICD-10-CM | POA: Diagnosis present

## 2024-01-29 DIAGNOSIS — Z832 Family history of diseases of the blood and blood-forming organs and certain disorders involving the immune mechanism: Secondary | ICD-10-CM

## 2024-01-29 DIAGNOSIS — R4189 Other symptoms and signs involving cognitive functions and awareness: Secondary | ICD-10-CM | POA: Insufficient documentation

## 2024-01-29 DIAGNOSIS — I340 Nonrheumatic mitral (valve) insufficiency: Secondary | ICD-10-CM | POA: Diagnosis present

## 2024-01-29 DIAGNOSIS — E875 Hyperkalemia: Secondary | ICD-10-CM

## 2024-01-29 DIAGNOSIS — I4821 Permanent atrial fibrillation: Secondary | ICD-10-CM | POA: Diagnosis present

## 2024-01-29 DIAGNOSIS — G2581 Restless legs syndrome: Secondary | ICD-10-CM | POA: Diagnosis present

## 2024-01-29 DIAGNOSIS — Z7901 Long term (current) use of anticoagulants: Secondary | ICD-10-CM

## 2024-01-29 DIAGNOSIS — R4689 Other symptoms and signs involving appearance and behavior: Secondary | ICD-10-CM | POA: Diagnosis not present

## 2024-01-29 DIAGNOSIS — I272 Pulmonary hypertension, unspecified: Secondary | ICD-10-CM | POA: Diagnosis present

## 2024-01-29 DIAGNOSIS — Z8673 Personal history of transient ischemic attack (TIA), and cerebral infarction without residual deficits: Secondary | ICD-10-CM

## 2024-01-29 DIAGNOSIS — Z66 Do not resuscitate: Secondary | ICD-10-CM | POA: Diagnosis present

## 2024-01-29 DIAGNOSIS — B372 Candidiasis of skin and nail: Secondary | ICD-10-CM | POA: Diagnosis not present

## 2024-01-29 DIAGNOSIS — Z789 Other specified health status: Secondary | ICD-10-CM

## 2024-01-29 DIAGNOSIS — R6520 Severe sepsis without septic shock: Secondary | ICD-10-CM | POA: Diagnosis present

## 2024-01-29 DIAGNOSIS — R54 Age-related physical debility: Secondary | ICD-10-CM | POA: Diagnosis present

## 2024-01-29 DIAGNOSIS — Z794 Long term (current) use of insulin: Secondary | ICD-10-CM

## 2024-01-29 DIAGNOSIS — L03115 Cellulitis of right lower limb: Secondary | ICD-10-CM | POA: Diagnosis present

## 2024-01-29 DIAGNOSIS — E1122 Type 2 diabetes mellitus with diabetic chronic kidney disease: Secondary | ICD-10-CM | POA: Diagnosis present

## 2024-01-29 DIAGNOSIS — Z951 Presence of aortocoronary bypass graft: Secondary | ICD-10-CM

## 2024-01-29 DIAGNOSIS — Z825 Family history of asthma and other chronic lower respiratory diseases: Secondary | ICD-10-CM

## 2024-01-29 DIAGNOSIS — I2510 Atherosclerotic heart disease of native coronary artery without angina pectoris: Secondary | ICD-10-CM

## 2024-01-29 DIAGNOSIS — M1712 Unilateral primary osteoarthritis, left knee: Secondary | ICD-10-CM | POA: Diagnosis not present

## 2024-01-29 DIAGNOSIS — Z91048 Other nonmedicinal substance allergy status: Secondary | ICD-10-CM

## 2024-01-29 DIAGNOSIS — Z751 Person awaiting admission to adequate facility elsewhere: Secondary | ICD-10-CM

## 2024-01-29 DIAGNOSIS — F419 Anxiety disorder, unspecified: Secondary | ICD-10-CM | POA: Diagnosis present

## 2024-01-29 DIAGNOSIS — R600 Localized edema: Secondary | ICD-10-CM

## 2024-01-29 DIAGNOSIS — E119 Type 2 diabetes mellitus without complications: Secondary | ICD-10-CM

## 2024-01-29 DIAGNOSIS — G4733 Obstructive sleep apnea (adult) (pediatric): Secondary | ICD-10-CM | POA: Diagnosis present

## 2024-01-29 DIAGNOSIS — Z6835 Body mass index (BMI) 35.0-35.9, adult: Secondary | ICD-10-CM

## 2024-01-29 LAB — URINALYSIS, ROUTINE W REFLEX MICROSCOPIC
Bilirubin Urine: NEGATIVE
Glucose, UA: NEGATIVE mg/dL
Ketones, ur: NEGATIVE mg/dL
Nitrite: POSITIVE — AB
Protein, ur: 100 mg/dL — AB
RBC / HPF: >50 RBC/hpf (ref 0–5)
Specific Gravity, Urine: 1.014 (ref 1.005–1.030)
WBC, UA: >50 WBC/hpf (ref 0–5)
pH: 5 (ref 5.0–8.0)

## 2024-01-29 LAB — CBG MONITORING, ED: Glucose-Capillary: 106 mg/dL — ABNORMAL HIGH (ref 70–99)

## 2024-01-29 LAB — CBC WITH DIFFERENTIAL/PLATELET
Abs Immature Granulocytes: 0.11 10*3/uL — ABNORMAL HIGH (ref 0.00–0.07)
Basophils Absolute: 0.1 10*3/uL (ref 0.0–0.1)
Basophils Relative: 0 %
Eosinophils Absolute: 0 10*3/uL (ref 0.0–0.5)
Eosinophils Relative: 0 %
HCT: 33.7 % — ABNORMAL LOW (ref 36.0–46.0)
Hemoglobin: 10.3 g/dL — ABNORMAL LOW (ref 12.0–15.0)
Immature Granulocytes: 1 %
Lymphocytes Relative: 2 %
Lymphs Abs: 0.4 10*3/uL — ABNORMAL LOW (ref 0.7–4.0)
MCH: 28.8 pg (ref 26.0–34.0)
MCHC: 30.6 g/dL (ref 30.0–36.0)
MCV: 94.1 fL (ref 80.0–100.0)
Monocytes Absolute: 0.5 10*3/uL (ref 0.1–1.0)
Monocytes Relative: 2 %
Neutro Abs: 21.8 10*3/uL — ABNORMAL HIGH (ref 1.7–7.7)
Neutrophils Relative %: 95 %
Platelets: 199 10*3/uL (ref 150–400)
RBC: 3.58 MIL/uL — ABNORMAL LOW (ref 3.87–5.11)
RDW: 17.2 % — ABNORMAL HIGH (ref 11.5–15.5)
WBC: 22.9 10*3/uL — ABNORMAL HIGH (ref 4.0–10.5)
nRBC: 0 % (ref 0.0–0.2)

## 2024-01-29 LAB — CBC
HCT: 34.8 % — ABNORMAL LOW (ref 36.0–46.0)
Hemoglobin: 10.9 g/dL — ABNORMAL LOW (ref 12.0–15.0)
MCH: 29.5 pg (ref 26.0–34.0)
MCHC: 31.3 g/dL (ref 30.0–36.0)
MCV: 94.1 fL (ref 80.0–100.0)
Platelets: 187 10*3/uL (ref 150–400)
RBC: 3.7 MIL/uL — ABNORMAL LOW (ref 3.87–5.11)
RDW: 17.1 % — ABNORMAL HIGH (ref 11.5–15.5)
WBC: 22.5 10*3/uL — ABNORMAL HIGH (ref 4.0–10.5)
nRBC: 0 % (ref 0.0–0.2)

## 2024-01-29 LAB — I-STAT CG4 LACTIC ACID, ED: Lactic Acid, Venous: 2.4 mmol/L (ref 0.5–1.9)

## 2024-01-29 LAB — BRAIN NATRIURETIC PEPTIDE: B Natriuretic Peptide: 145.4 pg/mL — ABNORMAL HIGH (ref 0.0–100.0)

## 2024-01-29 MED ORDER — SODIUM CHLORIDE 0.9 % IV BOLUS
1000.0000 mL | Freq: Once | INTRAVENOUS | Status: AC
  Administered 2024-01-29: 1000 mL via INTRAVENOUS

## 2024-01-29 MED ORDER — NYSTATIN 100000 UNIT/GM EX POWD: 1.0000 | Freq: Three times a day (TID) | CUTANEOUS | 0 refills | Status: DC

## 2024-01-29 MED ORDER — TRULICITY 1.5 MG/0.5ML ~~LOC~~ SOAJ: 1.5000 mg | SUBCUTANEOUS | 0 refills | Status: DC

## 2024-01-29 MED ORDER — DOXYCYCLINE HYCLATE 100 MG PO TABS
100.0000 mg | ORAL_TABLET | Freq: Two times a day (BID) | ORAL | 0 refills | Status: DC
Start: 2024-01-29 — End: 2024-02-05

## 2024-01-29 MED ORDER — MEGESTROL ACETATE 40 MG PO TABS: 40.0000 mg | ORAL_TABLET | Freq: Every day | ORAL | 0 refills | Status: DC

## 2024-01-29 MED ORDER — SODIUM CHLORIDE 0.9 % IV SOLN
1.0000 g | Freq: Once | INTRAVENOUS | Status: AC
  Administered 2024-01-29: 1 g via INTRAVENOUS
  Filled 2024-01-29: qty 10

## 2024-01-29 NOTE — Assessment & Plan Note (Signed)
 Recently taken off Jardiance by cardiology due to worsening candidal intertrigo of the gluteal folds.  Creatinine and GFR stable.  She has an appointment with her nephrologist set up.

## 2024-01-29 NOTE — ED Provider Notes (Signed)
  Physical Exam  BP (!) 81/33   Pulse 80   Temp 98.2 F (36.8 C) (Oral)   Resp (!) 21   LMP  (LMP Unknown)   SpO2 100%   Physical Exam Vitals and nursing note reviewed.  HENT:     Head: Normocephalic and atraumatic.  Eyes:     Pupils: Pupils are equal, round, and reactive to light.  Cardiovascular:     Rate and Rhythm: Normal rate and regular rhythm.  Pulmonary:     Effort: Pulmonary effort is normal.     Breath sounds: Normal breath sounds.  Abdominal:     Palpations: Abdomen is soft.     Tenderness: There is no abdominal tenderness.  Skin:    General: Skin is warm and dry.  Neurological:     Mental Status: She is alert.  Psychiatric:        Mood and Affect: Mood normal.     Procedures  .Critical Care  Performed by: Royanne Foots, DO Authorized by: Royanne Foots, DO   Critical care provider statement:    Critical care time (minutes):  30   Critical care was necessary to treat or prevent imminent or life-threatening deterioration of the following conditions:  Sepsis   Critical care was time spent personally by me on the following activities:  Development of treatment plan with patient or surrogate, discussions with consultants, evaluation of patient's response to treatment, examination of patient, ordering and review of laboratory studies, ordering and review of radiographic studies, ordering and performing treatments and interventions, pulse oximetry, re-evaluation of patient's condition and review of old charts   I assumed direction of critical care for this patient from another provider in my specialty: no     Care discussed with: admitting provider     ED Course / MDM   Clinical Course as of 01/29/24 2318  Wed Jan 29, 2024  2100 Laboratory workup notable for elevation in venous lactate as well as leukocytosis of 22.9.  Suspect urinary source as UA shows positive UTI.  She does have a history of UTIs.  CT head and chest x-ray unremarkable.  Tib-fib on the  right and left x-rays also unremarkable with no traumatic injury.  Will cover for UTI with ceftriaxone.  No evidence of septic shock at this time as her MAP has remained above 65.  Treading lightly with IV fluids considering her history of heart failure and issues with volume overload in the past.  Will continue monitor her hemodynamic status closely.  Will admit to medicine [MP]  2316 Discussed with admitting hospitalist on the family medicine service who accepts patient for admission [MP]    Clinical Course User Index [MP] Royanne Foots, DO   Medical Decision Making I, Estelle June DO, have assumed care of this patient from the previous provider pending remainder of laboratory workup, imaging, reevaluation and disposition  Amount and/or Complexity of Data Reviewed Labs: ordered. Radiology: ordered.  Risk Decision regarding hospitalization.          Royanne Foots, DO 01/29/24 2318

## 2024-01-29 NOTE — ED Provider Notes (Addendum)
 [place] EMERGENCY DEPARTMENT AT [facility] Provider Note   CSN: [id number] Arrival date & time: 01/29/24  1351     History  Chief Complaint  Patient presents with   Weakness   Leg Swelling    [name] is a 83 y.o. female.  Patient seen by primary care she is followed by family medicine service seen by them this morning.  Patient was able to walk in with her walker.  But when they got her home she could not really get out of the car well or back into the house and is feels just weak all over and family is very concerned obviously that they will not be able to manage her at home.  They did state that there has been some increased weakness over the past several days.  When family medicine saw her this morning they did mention the confusion leg sores that were previously seen March 6 is well-known to have history of CHF chronic kidney disease.  And patient has type 2 diabetes.  Patient's past medical history seems to include coronary artery disease and congestive heart failure bilateral lower extremity swelling with some wounds probably secondary to the swelling.  They did wrap her left leg.  She has a lot of chronic erythema changes to the right leg.       Home Medications Prior to Admission medications   Medication Sig Start Date End Date Taking? Authorizing Provider  acetaminophen (TYLENOL 8 HOUR) 650 MG CR tablet Take 1 tablet (650 mg total) by mouth every 8 (eight) hours as needed for pain. 11/03/19   Garnette Gunner, MD  apixaban (ELIQUIS) 2.5 MG TABS tablet Take 1 tablet (2.5 mg total) by mouth 2 (two) times daily. 12/06/23   Evette Georges, MD  atorvastatin (LIPITOR) 40 MG tablet Take 1 tablet (40 mg total) by mouth daily. 01/21/24   Laurey Morale, MD  Blood Glucose Monitoring Suppl (ONE TOUCH ULTRA 2) w/Device KIT USE TO CHECK BLOOD SUGAR UPTO 3 TIMES A DAY 12/06/23   Evette Georges, MD  carvedilol (COREG) 3.125 MG tablet Take 1 tablet (3.125 mg total) by mouth 2  (two) times daily with a meal. 01/23/24   Alen Bleacher, NP  Cholecalciferol 25 MCG (1000 UT) tablet Take 1,000 Units by mouth daily.    [provider]  clotrimazole (LOTRIMIN AF) 1 % cream Apply 1 Application topically 2 (two) times daily. 01/06/24   Elberta Fortis, MD  diclofenac Sodium (VOLTAREN) 1 % GEL Apply 2 g topically as needed. 12/06/23   Evette Georges, MD  docusate sodium (COLACE) 100 MG capsule Take 100 mg by mouth at bedtime.    [provider]  doxycycline (VIBRA-TABS) 100 MG tablet Take 1 tablet (100 mg total) by mouth 2 (two) times daily for 7 days. 01/29/24 02/05/24  Evette Georges, MD  Dulaglutide (TRULICITY) 1.5 MG/0.5ML SOAJ Inject 1.5 mg into the skin once a week. 01/29/24   Evette Georges, MD  ferrous sulfate 325 (65 FE) MG tablet TAKE 1 TABLET BY MOUTH EVERY DAY WITH BREAKFAST 09/16/20   Maury Dus, MD  glucose blood (ONE TOUCH ULTRA TEST) test strip Use three times a day 09/15/20   Maury Dus, MD  glucose blood test strip Use as instructed to check blood glucose up to 4x daily 10/10/22   Maury Dus, MD  Lancets [facility] DELICA PLUS [place]) MISC Please use to check blood sugar up to four times daily. 10/10/22   Maury Dus, MD  lidocaine (LIDODERM) 5 % Place 1 patch onto the skin daily. Remove & Discard patch within 12 hours or as directed by MD 12/23/23   Evette Georges, MD  megestrol (MEGACE) 40 MG tablet Take 1 tablet (40 mg total) by mouth daily. 01/29/24   Evette Georges, MD  metolazone (ZAROXOLYN) 2.5 MG tablet Take only as directed by Heart Failure Clinic 01/23/24   Alen Bleacher, NP  Multiple Vitamins-Minerals (PRESERVISION AREDS 2+MULTI VIT PO) Take 1 capsule by mouth in the morning and at bedtime.    [provider]  nitroGLYCERIN (NITROSTAT) 0.4 MG SL tablet Place 1 tablet (0.4 mg total) under the tongue every 5 (five) minutes as needed for chest pain (up to 3 doses). 12/19/23   Tiffany Kocher, DO  nystatin (MYCOSTATIN/NYSTOP) powder  Apply 1 Application topically 3 (three) times daily. Until resolution. 01/29/24   Evette Georges, MD  potassium chloride SA (KLOR-CON M) 20 MEQ tablet Take 3 tablets (60 mEq total) by mouth 2 (two) times daily. 01/07/24   [place], Anderson Malta, FNP  rOPINIRole (REQUIP) 0.5 MG tablet Take 2 tablets (1 mg total) by mouth at bedtime. 12/06/23   Evette Georges, MD  spironolactone (ALDACTONE) 25 MG tablet Take 0.5 tablets (12.5 mg total) by mouth daily. 12/06/23   Evette Georges, MD  torsemide (DEMADEX) 100 MG tablet Take 1 tablet (100 mg total) by mouth 2 (two) times daily. 01/07/24   [place], Anderson Malta, FNP  traMADol (ULTRAM) 50 MG tablet Take 1 tablet (50 mg total) by mouth every 12 (twelve) hours as needed for severe pain (pain score 7-10) (Can take every 6 hours for breakthrough pain.). 01/13/24   Evette Georges, MD  vitamin B-12 (CYANOCOBALAMIN) 1000 MCG tablet Take 1 tablet (1,000 mcg total) by mouth daily. 09/16/20   Maury Dus, MD  zinc oxide 20 % ointment Apply 1 application topically as needed (buttocks irritation). 10/12/21   Maury Dus, MD      Allergies    Benazepril, Cymbalta [duloxetine hcl], Ozempic (0.25 or 0.5 mg-dose) [semaglutide(0.25 or 0.5mg -dos)], Tape, Angiotensin receptor blockers, Bactrim [sulfamethoxazole-trimethoprim], and Fe-succ-c-thre-b12-des stomach    Review of Systems   Review of Systems  Unable to perform ROS: Mental status change    Physical Exam Updated Vital Signs BP (!) 84/62   Pulse 99   Temp 98.4 F (36.9 C) (Oral)   Resp 20   LMP  (LMP Unknown)   SpO2 100%  Physical Exam Vitals and nursing note reviewed.  Constitutional:      General: She is not in acute distress.    Appearance: Normal appearance. She is well-developed. She is not ill-appearing.  HENT:     Head: Normocephalic and atraumatic.     Mouth/Throat:     Mouth: Mucous membranes are moist.  Eyes:     Extraocular Movements: Extraocular movements intact.     Conjunctiva/sclera: Conjunctivae  normal.     Pupils: Pupils are equal, round, and reactive to light.  Cardiovascular:     Rate and Rhythm: Normal rate and regular rhythm.     Heart sounds: No murmur heard. Pulmonary:     Effort: Pulmonary effort is normal. No respiratory distress.     Breath sounds: Normal breath sounds.  Abdominal:     Palpations: Abdomen is soft.     Tenderness: There is no abdominal tenderness.  Musculoskeletal:        General: No swelling.     Cervical back: Neck supple.     Right lower leg: Edema  present.     Left lower leg: Edema present.  Skin:    General: Skin is warm and dry.     Capillary Refill: Capillary refill takes less than 2 seconds.  Neurological:     Mental Status: She is alert.     Comments: Bilateral lower extremity weakness difficult to assess upper extremities because she is got bilateral rotator cuff injuries.  Psychiatric:        Mood and Affect: Mood normal.     ED Results / Procedures / Treatments   Labs (all labs ordered are listed, but only abnormal results are displayed) Labs Reviewed  CBC - Abnormal; Notable for the following components:      Result Value   WBC 22.5 (*)    RBC 3.70 (*)    Hemoglobin 10.9 (*)    HCT 34.8 (*)    RDW 17.1 (*)    All other components within normal limits  CBG MONITORING, ED - Abnormal; Notable for the following components:   Glucose-Capillary 106 (*)    All other components within normal limits  CULTURE, BLOOD (ROUTINE X 2)  CULTURE, BLOOD (ROUTINE X 2)  BASIC METABOLIC PANEL  URINALYSIS, ROUTINE W REFLEX MICROSCOPIC  LIPASE, BLOOD  HEPATIC FUNCTION PANEL  BRAIN NATRIURETIC PEPTIDE  DIFFERENTIAL  I-STAT CG4 LACTIC ACID, ED    EKG None  Radiology No results found.  Procedures Procedures    Medications Ordered in ED Medications - No data to display  ED Course/ Medical Decision Making/ A&P                                 Medical Decision Making Amount and/or Complexity of Data Reviewed Labs:  ordered. Radiology: ordered.   Patient with generalized weakness seem to get much worse this afternoon.  Does not seem to have any upper respiratory symptoms.  Denies any pain anywhere but there is some degree of confusion.  Definitely has a bilateral lower extremity edema with chronic skin changes left lower extremity is wrapped.  Will get x-rays of both of that area will get chest x-ray will get head CT will CBC, basic metabolic panel will add on hepatic function test will get urinalysis.  See if we can see anything that is abnormal.  Blood sugar was good at 106 CBC is back white count is 22,000 with a hemoglobin of 10.6 platelets 187 so not significant anemia but some anemia marked leukocytosis.  Will get a differential on that.  Based on that we will get blood cultures and lactic acid.  EKG consistent with atrial fibrillation with a heart rate of 98.  Patient does have a history of atrial fibs possibly could be flutter but most likely atrial fibs.  No significant change.   Final Clinical Impression(s) / ED Diagnoses Final diagnoses:  Leukocytosis, unspecified type  Generalized weakness  Bilateral lower extremity edema    Rx / DC Orders ED Discharge Orders     None         Vanetta Mulders, MD 01/29/24 1535    Vanetta Mulders, MD 01/29/24 1600

## 2024-01-29 NOTE — Assessment & Plan Note (Signed)
 No recurrent episodes of vaginal bleeding.  Continue Megestrol until she is able to follow-up with OB/GYN.  Will send message to referral coordinator to see if we can get her connected.

## 2024-01-29 NOTE — Assessment & Plan Note (Signed)
 Unclear cause.  Patient appears to be at baseline for me.  Given multiple months of ongoing symptoms, less likely acute process such as stroke.  Will collect labs for reversible cause of dementia.  Consider head imaging for further workup.  Will also refer to geriatric clinic for formal testing.

## 2024-01-29 NOTE — H&P (Shared)
 Hospital Admission History and Physical Service Pager: [phone]  Patient name: [name] Medical record number: [id number] Date of Birth: 12/04/1940 Age: 83 y.o. Gender: female  Primary Care Provider: Evette Georges, MD Consultants: None Code Status: FULL CODE  Preferred Emergency Contact:  Contact Information     Name Relation Home Work Mobile   Shoe,Robert Einar Gip   [phone]      Other Contacts   None on File     Chief Complaint: Generalized weakness, cellulitis, UTI  Assessment and Plan: [name] is a 83 y.o. female presenting with generalized weakness.  Suspect her worsening weakness today is in the setting of cellulitis of her right lower extremity along with acute urinary tract infection.  She has significant leukocytosis at 22 but afebrile, along with lactic acid of 2.4. She has had soft blood pressures while in the ED, suspected in the setting of acute infection, which are likely causing her to feel weak.  Of note, blood pressures are being taken on her forearm due to very limited mobility of her upper extremities from rotator cuff repairs.  Assessment & Plan Cellulitis of right leg Was seen by PCP 3/19 for wound to right lower leg, sent home with doxycycline for cellulitis which she did not have a chance to start before coming to the ED. Pt had ABIs done on 3/13, deemed stable and planned for Unna boot placement by cardiology. Pt afebrile, but with leukocytosis 22.9 and lactic acid 2.4. Xrays of bilateral tibfibs unremarkable.  Upon chart review the photos it does appear that her cellulitis rapidly worsened since leaving clinic. -Admitted to FMTS, progressive, Dr. Linwood Dibbles attending - Vancomycin per pharmacy - Wound care consult placed - Trend lactic acid - Tylenol 650 mg every 6 hours as needed for pain - Blood cultures pending - Fall precautions - AM CBC, BMP Urinary tract infection Urinary frequency and urgency x 2 days.  Urinalysis in the ED  showing UTI.  Received 1 dose of ceftriaxone in the ED. - Ceftriaxone 1g q24h - Urine culture pending - Strict I/O Generalized weakness Suspect weakness exacerbated by current infection however patient states that this along with confusion has been going on for several months.  Given duration of symptoms do not suspect acute neurological event such as stroke, CT head unremarkable which is reassuring. - Continue to monitor for confusion - Delirium precautions - PT/OT CHF (congestive heart failure) (HCC) Echo 01/14/2023 with LVEF 50%, LV mildly decreased function and regional WMA, RV systolic function mildly reduced, moderately elevated pulmonary artery systolic pressure. Follows with heart failure team, last seen on 3/13. Takes Metolazone as directed by HF team. BNP slightly elevated at 145.4, do not suspect acute exacerbation at this time although pt feels like her leg swelling is worse.  - Consider consult to heart failure team in AM - Holding home spironolactone, coreg, torsemide in setting of hypotension - Consider repeat echo - Strict Is/O, daily weights - Caution with fluid resuscitation Type 2 diabetes mellitus (HCC) On trulicity at home. Last A1C 8.2 06/2022. - Very sensitive sliding scale, unsure what current renal function is but has CKD 4 - CBGs before meals and at bedtime - BMP pending - A1c in a.m.   Chronic and Stable Problems:  Vaginal bleeding - stable on Megace 40mg  daily, has had endometrial biopsy with benign polyps CAD- s/p CABG in 2009 with 2 stents, continue Lipitor 40mg  daily CKD4- awaiting BMP from this admisison Hypertension - holding home spironolactone,  coreg in setting of soft Bps here A-fib- continue Eliquis 2.5mg  BID (renally dosed), cardiac telemetry Restless leg syndrome - continue Requip 1mg  at bedtime  Intertrigo - continue nystatin powder to gluteal folds Hx CVA - continue Eliquis OSA - does not use cpap  FEN/GI: Carb modified VTE Prophylaxis: Home  Eliquis 2.5mg  BID  Disposition: Progressive  History of Present Illness:  [name] is a 83 y.o. female presenting with generalized weakness.  States that her weakness has been going on for a long time.  She saw her PCP today for ongoing confusion for several months and leg wounds.  HIV, RPR, vitamin B12 collected at the clinic to evaluate for reversible causes of dementia and patient was scheduled to follow-up in geriatric clinic.  Patient was prescribed doxycycline 100 mg twice daily for 7 days for wound on right lower extremity concerning for cellulitis.  When patient went home, she was unable to get out of the car well or get back into the house due to weakness so she decided to come to the emergency room. Patient reports pain to her right lower extremity along with urinary urgency and frequency x 2 days.  States that she urinated 40 times last night.  Denies dysuria or hematuria.  Denies chest pain, SOB, fevers, abdominal pain, nausea, vomiting.   In the ED, found to have leukocytosis at 22.9, lactic acid 2.4, U/A concerning for UTI. BNP mildly elevated at 145.4. Pt given 1L IVF bolus and dose of ceftriaxone. CT head, CXR, and bilateral tib fib Xrs unremarkable.   Review Of Systems: Per HPI with the following additions: none  Pertinent Past Medical History: Anxiety Arthritis CAD-CABG in 2009 with 2 stents CVA CHF CKD Type 2 diabetes GERD Hyperlipidemia Hypertension A-fib OSA  Remainder reviewed in history tab.   Pertinent Past Surgical History: Appendectomy Back surgery Carpal tunnel release 2022 Cataract extraction 2012 CABG with 2 stents 2009 Hernia repair 2000 Total knee arthroplasty on the right 1994  Remainder reviewed in history tab.   Pertinent Social History: Tobacco use: No Alcohol use: No Other Substance use: No Lives with Son  Pertinent Family History: Mother - cerebral hemorrhage Father - emphysema  Remainder reviewed in history tab.    Important Outpatient Medications: Eliquis 2.5 mg twice daily Atorvastatin 40 mg daily Megace 40 mg daily Requip 1 mg at bedtime Torsemide 100 mg twice daily Spironolactone 12.5 mg daily Metolazone only as directed by heart failure clinic Trulicity 1.5 mg weekly Carvedilol 3.125 mg twice daily  Remainder reviewed in medication history.   Objective: BP 98/85   Pulse 84   Temp 98.2 F (36.8 C) (Oral)   Resp 16   LMP  (LMP Unknown)   SpO2 100%  Exam: General: Chronically ill-appearing, no acute distress ENTM: Dry mucous membranes Cardiovascular: Irregularly irregular rhythm, normal rate, no murmurs, 2+ radial pulses Respiratory: Normal work of breathing on room air, CTAB Gastrointestinal: Normal bowel sounds, soft, nontender Neuro: Alert and oriented to person, place, time, situation MSK: Bilateral lower extremity edema.  Erythema, warmth to right lower extremity.  Left lower extremity wrapped.  Wound present to right lower extremity as seen in photo.  Unable to lift bilateral upper extremities due to previous rotator cuff repairs.   Photo of RLE and LLE from clinic earlier in the day on 3/19:   Labs:  CBC BMET  Recent Labs  Lab 01/29/24 1618  WBC 22.9*  HGB 10.3*  HCT 33.7*  PLT 199   Recent Labs  Lab 01/23/24 0944  NA 138  K 4.6  CL 105  CO2 18*  BUN 73*  CREATININE 2.07*  GLUCOSE 227*  CALCIUM 10.2    BNP 145.4 Bcx pending Lactic acid 2.4 U/A large blood, positive nitrite, large leukocytes,   EKG: Atrial fibrillation, 98BPM, Qtc . Motion artifact. No acute STT changes.    Imaging Studies Performed:  CXR 01/29/24 IMPRESSION: No active cardiopulmonary disease.  XR R Tib/Fib 01/29/24 IMPRESSION: Prior right knee replacement.  No acute bony abnormality.  XR L Tib/Fib 01/29/24 IMPRESSION: No acute bony abnormality.  CT Head WO Contrast 01/29/24 IMPRESSION: Atrophy, chronic microvascular disease.  No acute intracranial abnormality.  I  independently reviewed and agree with radiologist's impression of imaging study.  Everhart, Tamala Julian, DO 01/30/2024, 12:14 AM PGY-1, [facility]  FPTS Intern pager: [phone], text pages welcome Secure chat group [facility] Teaching Service     FPTS Upper-Level Resident Addendum   I have independently interviewed and examined the patient. I have discussed the above with Dr. Rexene Alberts and agree with the documented plan. My edits for correction/addition/clarification are included above. Please see any attending notes.   Vonna Drafts, MD PGY-2, [facility] 01/30/2024 1:46 AM  FPTS Service pager: [phone] (text pages welcome through [facility])

## 2024-01-29 NOTE — Patient Instructions (Addendum)
 I will call the OB and see if they can see you soon. Here is the number in case you do not hear from them:  [facility] [address] [place], [place] [address] [phone]  You can call the home health provider too if you have not heard from them: [facility]  Ph [phone]   We collected labs today for your confusion. I have sent in nystatin powder for your bottom to help reduce moisture. We have also placed a dressing on your leg to help with the weeping and fluid. I will send in an antibiotic for your legs to ensure they do not get infected.  I have refilled your trulicity and your megestrol. Let me know if you have further issues with bleeding.  Come back in 1 week to check on wounds.

## 2024-01-29 NOTE — Assessment & Plan Note (Signed)
 Persistent and now with worsening erythema and skin breakdown in the gluteal folds. Feel large amount of moisture in the areas contributing.  Advised to stop Lotrimin cream and will start nystatin powder.  Follow-up in 1 week.

## 2024-01-29 NOTE — ED Triage Notes (Signed)
 Pt BIB by EMS for generalized weakness and BLE swelling. Hx of CHF. Pt concerned that she is needing more assistance at home.

## 2024-01-29 NOTE — Progress Notes (Signed)
 SUBJECTIVE:   CHIEF COMPLAINT / HPI:   Confusion Has been going on since Thanksgiving. Feels more confused now intermittently. Feels she may not be taking her medications like she says. Feels like this happened after going off jardiance  She has been sleeping more during the day. Son is unsure of what she does overnight. She has started to ride up and down on her lift chair. Sometimes her speech is abnormal.  Leg sores Previously seen for this on 01/16/2024.  Felt to be due to venous stasis.  Xeroform dressing with Kerlix and Ace wrap was applied.  She has been working with her cardiologist to reduce fluid on her legs with both Furoscix and and then with metolazone.  ABIs recently completed by cardiology that were stable.   CHF, CKD4 Was having increased urinary incontinence and has intertrigo of the gluteal folds so she was taken off of her Jardiance by cardiology.  Recent BMP mildly elevated from prior (99.4) recently to 109.6. Son has changed her diet to reduce sodium.  Vaginal bleeding Has continued to take both Megace and Eliquis.  She was previously referred to gynecology for continued bleeding despite endometrial biopsy with benign polyps.  Recent CBC stable.  T2DM Recent A1c 6.7.  She would like a refill of her Trulicity today.  PERTINENT  PMH / PSH: Stroke in 2017  OBJECTIVE:   BP 136/72   Pulse 78   Ht 5\' 8"  (1.727 m)   Wt 217 lb 12.8 oz (98.8 kg)   LMP  (LMP Unknown)   SpO2 96%   BMI 33.12 kg/m   General: Alert and oriented, in NAD Skin: Warm, dry, and intact without lesions HEENT: NCAT, EOM grossly normal, midline nasal septum Cardiac: RRR, no m/r/g appreciated Respiratory: CTAB, breathing and speaking comfortably on RA Abdominal: Soft, nontender, nondistended, normoactive bowel sounds Extremities: Moves all extremities grossly equally Neurological: No gross focal deficit Psychiatric: Appropriate mood and affect    Left lower limb   Right lower  limb  ASSESSMENT/PLAN:   Cognitive and behavioral changes Unclear cause.  Patient appears to be at baseline for me.  Given multiple months of ongoing symptoms, less likely acute process such as stroke.  Will collect labs for reversible cause of dementia.  Consider head imaging for further workup.  Will also refer to geriatric clinic for formal testing.  Vaginal bleeding No recurrent episodes of vaginal bleeding.  Continue Megestrol until she is able to follow-up with OB/GYN.  Will send message to referral coordinator to see if we can get her connected.  Venous stasis Venous stasis present today with evidence of ulceration on the left side with overlying erythema on the right side.  Reassuringly no purulence.  However, erythema on the right side could be concerning for cellulitis.  Will send in doxycycline 100 mg twice daily for 7 days.  Will also add DuoDERM dressing to left lower extremity to help with weeping wound.  Patient to follow-up tomorrow with cardiology to adjust diuresis.  They also are getting her connected with home health for unna boots.  Follow-up in 1 week.  Candidal intertrigo Persistent and now with worsening erythema and skin breakdown in the gluteal folds. Feel large amount of moisture in the areas contributing.  Advised to stop Lotrimin cream and will start nystatin powder.  Follow-up in 1 week.  CKD (chronic kidney disease) stage 4, GFR 15-29 ml/min (HCC) Recently taken off Jardiance by cardiology due to worsening candidal intertrigo of the gluteal folds.  Creatinine and GFR stable.  She has an appointment with her nephrologist set up.  DM type 2 with diabetic peripheral neuropathy (HCC) Using her medications well.  Needs refill of Trulicity.  Will send in.   Janeal Holmes, MD [facility]

## 2024-01-29 NOTE — Assessment & Plan Note (Signed)
 Venous stasis present today with evidence of ulceration on the left side with overlying erythema on the right side.  Reassuringly no purulence.  However, erythema on the right side could be concerning for cellulitis.  Will send in doxycycline 100 mg twice daily for 7 days.  Will also add DuoDERM dressing to left lower extremity to help with weeping wound.  Patient to follow-up tomorrow with cardiology to adjust diuresis.  They also are getting her connected with home health for unna boots.  Follow-up in 1 week.

## 2024-01-29 NOTE — Telephone Encounter (Signed)
 Called to confirm/remind patient of their appointment at [facility] on 01/30/2024 9:30.   Appointment:   [] Confirmed  [x] Left mess   [] No answer/No voice mail  [] Phone not in service  Patient reminded to bring all medications and/or complete list.  Confirmed patient has transportation. Gave directions, instructed to utilize valet parking.

## 2024-01-29 NOTE — Assessment & Plan Note (Signed)
 Using her medications well.  Needs refill of Trulicity.  Will send in.

## 2024-01-30 ENCOUNTER — Inpatient Hospital Stay (HOSPITAL_COMMUNITY)

## 2024-01-30 ENCOUNTER — Other Ambulatory Visit: Payer: Self-pay

## 2024-01-30 ENCOUNTER — Ambulatory Visit (HOSPITAL_COMMUNITY): Admission: RE | Admit: 2024-01-30 | Source: Ambulatory Visit

## 2024-01-30 ENCOUNTER — Inpatient Hospital Stay (HOSPITAL_COMMUNITY)
Admission: RE | Admit: 2024-01-30 | Discharge: 2024-01-30 | Disposition: A | Discharge status: Home / Self Care | Source: Ambulatory Visit

## 2024-01-30 ENCOUNTER — Telehealth (HOSPITAL_COMMUNITY): Payer: Self-pay | Admitting: Family Medicine

## 2024-01-30 ENCOUNTER — Encounter (HOSPITAL_COMMUNITY): Payer: Self-pay | Admitting: Family Medicine

## 2024-01-30 DIAGNOSIS — I4811 Longstanding persistent atrial fibrillation: Secondary | ICD-10-CM | POA: Diagnosis not present

## 2024-01-30 DIAGNOSIS — M7989 Other specified soft tissue disorders: Secondary | ICD-10-CM | POA: Diagnosis not present

## 2024-01-30 DIAGNOSIS — R6520 Severe sepsis without septic shock: Secondary | ICD-10-CM | POA: Diagnosis not present

## 2024-01-30 DIAGNOSIS — H353122 Nonexudative age-related macular degeneration, left eye, intermediate dry stage: Secondary | ICD-10-CM | POA: Diagnosis not present

## 2024-01-30 DIAGNOSIS — I959 Hypotension, unspecified: Secondary | ICD-10-CM | POA: Diagnosis not present

## 2024-01-30 DIAGNOSIS — I255 Ischemic cardiomyopathy: Secondary | ICD-10-CM | POA: Diagnosis not present

## 2024-01-30 DIAGNOSIS — E785 Hyperlipidemia, unspecified: Secondary | ICD-10-CM | POA: Diagnosis not present

## 2024-01-30 DIAGNOSIS — I2510 Atherosclerotic heart disease of native coronary artery without angina pectoris: Secondary | ICD-10-CM | POA: Diagnosis not present

## 2024-01-30 DIAGNOSIS — R531 Weakness: Principal | ICD-10-CM

## 2024-01-30 DIAGNOSIS — Z794 Long term (current) use of insulin: Secondary | ICD-10-CM | POA: Diagnosis not present

## 2024-01-30 DIAGNOSIS — I509 Heart failure, unspecified: Secondary | ICD-10-CM | POA: Diagnosis not present

## 2024-01-30 DIAGNOSIS — N184 Chronic kidney disease, stage 4 (severe): Secondary | ICD-10-CM | POA: Diagnosis not present

## 2024-01-30 DIAGNOSIS — L304 Erythema intertrigo: Secondary | ICD-10-CM | POA: Diagnosis not present

## 2024-01-30 DIAGNOSIS — N390 Urinary tract infection, site not specified: Secondary | ICD-10-CM | POA: Diagnosis not present

## 2024-01-30 DIAGNOSIS — Z6835 Body mass index (BMI) 35.0-35.9, adult: Secondary | ICD-10-CM | POA: Diagnosis not present

## 2024-01-30 DIAGNOSIS — I5043 Acute on chronic combined systolic (congestive) and diastolic (congestive) heart failure: Secondary | ICD-10-CM | POA: Diagnosis not present

## 2024-01-30 DIAGNOSIS — Z66 Do not resuscitate: Secondary | ICD-10-CM | POA: Diagnosis not present

## 2024-01-30 DIAGNOSIS — Z7401 Bed confinement status: Secondary | ICD-10-CM | POA: Diagnosis not present

## 2024-01-30 DIAGNOSIS — A419 Sepsis, unspecified organism: Secondary | ICD-10-CM | POA: Diagnosis not present

## 2024-01-30 DIAGNOSIS — Z791 Long term (current) use of non-steroidal anti-inflammatories (NSAID): Secondary | ICD-10-CM | POA: Diagnosis not present

## 2024-01-30 DIAGNOSIS — Z789 Other specified health status: Secondary | ICD-10-CM

## 2024-01-30 DIAGNOSIS — I4821 Permanent atrial fibrillation: Secondary | ICD-10-CM | POA: Diagnosis not present

## 2024-01-30 DIAGNOSIS — I130 Hypertensive heart and chronic kidney disease with heart failure and stage 1 through stage 4 chronic kidney disease, or unspecified chronic kidney disease: Secondary | ICD-10-CM | POA: Diagnosis not present

## 2024-01-30 DIAGNOSIS — R0989 Other specified symptoms and signs involving the circulatory and respiratory systems: Secondary | ICD-10-CM | POA: Diagnosis not present

## 2024-01-30 DIAGNOSIS — I493 Ventricular premature depolarization: Secondary | ICD-10-CM | POA: Diagnosis not present

## 2024-01-30 DIAGNOSIS — I5021 Acute systolic (congestive) heart failure: Secondary | ICD-10-CM | POA: Diagnosis not present

## 2024-01-30 DIAGNOSIS — I5022 Chronic systolic (congestive) heart failure: Secondary | ICD-10-CM

## 2024-01-30 DIAGNOSIS — Z7901 Long term (current) use of anticoagulants: Secondary | ICD-10-CM | POA: Diagnosis not present

## 2024-01-30 DIAGNOSIS — E875 Hyperkalemia: Secondary | ICD-10-CM

## 2024-01-30 DIAGNOSIS — R0600 Dyspnea, unspecified: Secondary | ICD-10-CM | POA: Diagnosis not present

## 2024-01-30 DIAGNOSIS — I4891 Unspecified atrial fibrillation: Secondary | ICD-10-CM | POA: Diagnosis not present

## 2024-01-30 DIAGNOSIS — Z951 Presence of aortocoronary bypass graft: Secondary | ICD-10-CM | POA: Diagnosis not present

## 2024-01-30 DIAGNOSIS — E113591 Type 2 diabetes mellitus with proliferative diabetic retinopathy without macular edema, right eye: Secondary | ICD-10-CM | POA: Diagnosis not present

## 2024-01-30 DIAGNOSIS — L89152 Pressure ulcer of sacral region, stage 2: Secondary | ICD-10-CM | POA: Diagnosis not present

## 2024-01-30 DIAGNOSIS — I4729 Other ventricular tachycardia: Secondary | ICD-10-CM | POA: Diagnosis not present

## 2024-01-30 DIAGNOSIS — L03115 Cellulitis of right lower limb: Secondary | ICD-10-CM | POA: Diagnosis present

## 2024-01-30 DIAGNOSIS — I472 Ventricular tachycardia, unspecified: Secondary | ICD-10-CM | POA: Diagnosis not present

## 2024-01-30 DIAGNOSIS — H353114 Nonexudative age-related macular degeneration, right eye, advanced atrophic with subfoveal involvement: Secondary | ICD-10-CM | POA: Diagnosis not present

## 2024-01-30 DIAGNOSIS — L03116 Cellulitis of left lower limb: Secondary | ICD-10-CM | POA: Diagnosis not present

## 2024-01-30 DIAGNOSIS — R600 Localized edema: Secondary | ICD-10-CM | POA: Diagnosis not present

## 2024-01-30 DIAGNOSIS — I340 Nonrheumatic mitral (valve) insufficiency: Secondary | ICD-10-CM | POA: Diagnosis not present

## 2024-01-30 DIAGNOSIS — E1142 Type 2 diabetes mellitus with diabetic polyneuropathy: Secondary | ICD-10-CM | POA: Diagnosis not present

## 2024-01-30 DIAGNOSIS — B9620 Unspecified Escherichia coli [E. coli] as the cause of diseases classified elsewhere: Secondary | ICD-10-CM | POA: Diagnosis not present

## 2024-01-30 DIAGNOSIS — E1122 Type 2 diabetes mellitus with diabetic chronic kidney disease: Secondary | ICD-10-CM | POA: Diagnosis not present

## 2024-01-30 DIAGNOSIS — I10 Essential (primary) hypertension: Secondary | ICD-10-CM | POA: Diagnosis not present

## 2024-01-30 DIAGNOSIS — G2581 Restless legs syndrome: Secondary | ICD-10-CM | POA: Diagnosis not present

## 2024-01-30 DIAGNOSIS — I482 Chronic atrial fibrillation, unspecified: Secondary | ICD-10-CM | POA: Diagnosis not present

## 2024-01-30 DIAGNOSIS — E119 Type 2 diabetes mellitus without complications: Secondary | ICD-10-CM

## 2024-01-30 DIAGNOSIS — I272 Pulmonary hypertension, unspecified: Secondary | ICD-10-CM | POA: Diagnosis not present

## 2024-01-30 LAB — BLOOD GAS, VENOUS
Acid-base deficit: 10.2 mmol/L — ABNORMAL HIGH (ref 0.0–2.0)
Bicarbonate: 15.4 mmol/L — ABNORMAL LOW (ref 20.0–28.0)
O2 Saturation: 64.1 %
Patient temperature: 37
pCO2, Ven: 32 mmHg — ABNORMAL LOW (ref 44–60)
pH, Ven: 7.29 (ref 7.25–7.43)
pO2, Ven: 35 mmHg (ref 32–45)

## 2024-01-30 LAB — COMPREHENSIVE METABOLIC PANEL
ALT: 9 U/L (ref 0–44)
ALT: 11 U/L (ref 0–44)
AST: 15 U/L (ref 15–41)
AST: 18 U/L (ref 15–41)
Albumin: 2.6 g/dL — ABNORMAL LOW (ref 3.5–5.0)
Albumin: 2.5 g/dL — ABNORMAL LOW (ref 3.5–5.0)
Alkaline Phosphatase: 73 U/L (ref 38–126)
Alkaline Phosphatase: 78 U/L (ref 38–126)
Anion gap: 5 (ref 5–15)
Anion gap: 10 (ref 5–15)
BUN: 42 mg/dL — ABNORMAL HIGH (ref 8–23)
BUN: 43 mg/dL — ABNORMAL HIGH (ref 8–23)
CO2: 13 mmol/L — ABNORMAL LOW (ref 22–32)
CO2: 11 mmol/L — ABNORMAL LOW (ref 22–32)
Calcium: 9.6 mg/dL (ref 8.9–10.3)
Calcium: 10 mg/dL (ref 8.9–10.3)
Chloride: 116 mmol/L — ABNORMAL HIGH (ref 98–111)
Chloride: 117 mmol/L — ABNORMAL HIGH (ref 98–111)
Creatinine, Ser: 1.86 mg/dL — ABNORMAL HIGH (ref 0.44–1.00)
Creatinine, Ser: 1.94 mg/dL — ABNORMAL HIGH (ref 0.44–1.00)
GFR, Estimated: 26 mL/min — ABNORMAL LOW (ref >60)
GFR, Estimated: 25 mL/min — ABNORMAL LOW (ref >60)
Glucose, Bld: 146 mg/dL — ABNORMAL HIGH (ref 70–99)
Glucose, Bld: 238 mg/dL — ABNORMAL HIGH (ref 70–99)
Potassium: >7.5 mmol/L (ref 3.5–5.1)
Potassium: 7.2 mmol/L (ref 3.5–5.1)
Sodium: 134 mmol/L — ABNORMAL LOW (ref 135–145)
Sodium: 138 mmol/L (ref 135–145)
Total Bilirubin: 1 mg/dL (ref 0.0–1.2)
Total Bilirubin: UNDETERMINED mg/dL (ref 0.0–1.2)
Total Protein: 5.9 g/dL — ABNORMAL LOW (ref 6.5–8.1)
Total Protein: 5.8 g/dL — ABNORMAL LOW (ref 6.5–8.1)

## 2024-01-30 LAB — BASIC METABOLIC PANEL
Anion gap: 8 (ref 5–15)
Anion gap: 10 (ref 5–15)
Anion gap: 7 (ref 5–15)
BUN: 43 mg/dL — ABNORMAL HIGH (ref 8–23)
BUN: 42 mg/dL — ABNORMAL HIGH (ref 8–23)
BUN: 41 mg/dL — ABNORMAL HIGH (ref 8–23)
CO2: 15 mmol/L — ABNORMAL LOW (ref 22–32)
CO2: 13 mmol/L — ABNORMAL LOW (ref 22–32)
CO2: 18 mmol/L — ABNORMAL LOW (ref 22–32)
Calcium: 10.4 mg/dL — ABNORMAL HIGH (ref 8.9–10.3)
Calcium: 10 mg/dL (ref 8.9–10.3)
Calcium: 9.8 mg/dL (ref 8.9–10.3)
Chloride: 113 mmol/L — ABNORMAL HIGH (ref 98–111)
Chloride: 112 mmol/L — ABNORMAL HIGH (ref 98–111)
Chloride: 112 mmol/L — ABNORMAL HIGH (ref 98–111)
Creatinine, Ser: 1.94 mg/dL — ABNORMAL HIGH (ref 0.44–1.00)
Creatinine, Ser: 1.88 mg/dL — ABNORMAL HIGH (ref 0.44–1.00)
Creatinine, Ser: 1.86 mg/dL — ABNORMAL HIGH (ref 0.44–1.00)
GFR, Estimated: 25 mL/min — ABNORMAL LOW (ref >60)
GFR, Estimated: 26 mL/min — ABNORMAL LOW (ref >60)
GFR, Estimated: 26 mL/min — ABNORMAL LOW (ref >60)
Glucose, Bld: 166 mg/dL — ABNORMAL HIGH (ref 70–99)
Glucose, Bld: 178 mg/dL — ABNORMAL HIGH (ref 70–99)
Glucose, Bld: 195 mg/dL — ABNORMAL HIGH (ref 70–99)
Potassium: 6.1 mmol/L — ABNORMAL HIGH (ref 3.5–5.1)
Potassium: 6 mmol/L — ABNORMAL HIGH (ref 3.5–5.1)
Potassium: 5.7 mmol/L — ABNORMAL HIGH (ref 3.5–5.1)
Sodium: 136 mmol/L (ref 135–145)
Sodium: 135 mmol/L (ref 135–145)
Sodium: 137 mmol/L (ref 135–145)

## 2024-01-30 LAB — HIV ANTIBODY (ROUTINE TESTING W REFLEX): HIV Screen 4th Generation wRfx: NONREACTIVE

## 2024-01-30 LAB — VITAMIN B12: Vitamin B-12: 1964 pg/mL — ABNORMAL HIGH (ref 232–1245)

## 2024-01-30 LAB — MRSA NEXT GEN BY PCR, NASAL: MRSA by PCR Next Gen: NOT DETECTED

## 2024-01-30 LAB — CBC
HCT: 31.6 % — ABNORMAL LOW (ref 36.0–46.0)
Hemoglobin: 9.8 g/dL — ABNORMAL LOW (ref 12.0–15.0)
MCH: 29.4 pg (ref 26.0–34.0)
MCHC: 31 g/dL (ref 30.0–36.0)
MCV: 94.9 fL (ref 80.0–100.0)
Platelets: 144 10*3/uL — ABNORMAL LOW (ref 150–400)
RBC: 3.33 MIL/uL — ABNORMAL LOW (ref 3.87–5.11)
RDW: 17.5 % — ABNORMAL HIGH (ref 11.5–15.5)
WBC: 24.5 10*3/uL — ABNORMAL HIGH (ref 4.0–10.5)
nRBC: 0 % (ref 0.0–0.2)

## 2024-01-30 LAB — CK: Total CK: 40 U/L (ref 38–234)

## 2024-01-30 LAB — CBG MONITORING, ED
Glucose-Capillary: 109 mg/dL — ABNORMAL HIGH (ref 70–99)
Glucose-Capillary: 144 mg/dL — ABNORMAL HIGH (ref 70–99)
Glucose-Capillary: 201 mg/dL — ABNORMAL HIGH (ref 70–99)

## 2024-01-30 LAB — MAGNESIUM: Magnesium: 1.8 mg/dL (ref 1.7–2.4)

## 2024-01-30 LAB — I-STAT CG4 LACTIC ACID, ED
Lactic Acid, Venous: 2.9 mmol/L (ref 0.5–1.9)
Lactic Acid, Venous: 3.4 mmol/L (ref 0.5–1.9)

## 2024-01-30 LAB — LACTIC ACID, PLASMA
Lactic Acid, Venous: 2.6 mmol/L (ref 0.5–1.9)
Lactic Acid, Venous: 2.5 mmol/L (ref 0.5–1.9)
Lactic Acid, Venous: 3 mmol/L (ref 0.5–1.9)

## 2024-01-30 LAB — GLUCOSE, CAPILLARY: Glucose-Capillary: 170 mg/dL — ABNORMAL HIGH (ref 70–99)

## 2024-01-30 LAB — HEMOGLOBIN A1C
Hgb A1c MFr Bld: 7.8 % — ABNORMAL HIGH (ref 4.8–5.6)
Mean Plasma Glucose: 177.16 mg/dL

## 2024-01-30 LAB — RPR: RPR Ser Ql: NONREACTIVE

## 2024-01-30 MED ORDER — DEXTROSE 50 % IV SOLN
1.0000 | Freq: Once | INTRAVENOUS | Status: AC
  Administered 2024-01-30: 50 mL via INTRAVENOUS
  Filled 2024-01-30: qty 50

## 2024-01-30 MED ORDER — INSULIN ASPART 100 UNIT/ML IV SOLN
5.0000 [IU] | Freq: Once | INTRAVENOUS | Status: AC
  Administered 2024-01-30: 5 [IU] via INTRAVENOUS

## 2024-01-30 MED ORDER — INSULIN ASPART 100 UNIT/ML IJ SOLN
0.0000 [IU] | Freq: Three times a day (TID) | INTRAMUSCULAR | Status: DC
  Administered 2024-01-30 – 2024-01-31 (×3): 2 [IU] via SUBCUTANEOUS
  Administered 2024-02-01: 1 [IU] via SUBCUTANEOUS

## 2024-01-30 MED ORDER — INSULIN ASPART 100 UNIT/ML IV SOLN: 5.0000 [IU] | Freq: Once | INTRAVENOUS | Status: DC

## 2024-01-30 MED ORDER — BRIMONIDINE TARTRATE 0.2 % OP SOLN
1.0000 [drp] | Freq: Two times a day (BID) | OPHTHALMIC | Status: DC
  Administered 2024-01-30 – 2024-02-07 (×16): 1 [drp] via OPHTHALMIC
  Filled 2024-01-30: qty 5

## 2024-01-30 MED ORDER — SODIUM CHLORIDE 0.9 % IV BOLUS
500.0000 mL | Freq: Once | INTRAVENOUS | Status: AC
  Administered 2024-01-30: 500 mL via INTRAVENOUS

## 2024-01-30 MED ORDER — INSULIN ASPART 100 UNIT/ML IV SOLN
10.0000 [IU] | Freq: Once | INTRAVENOUS | Status: AC
  Administered 2024-01-30: 10 [IU] via INTRAVENOUS

## 2024-01-30 MED ORDER — ACETAMINOPHEN 650 MG RE SUPP: 650.0000 mg | Freq: Four times a day (QID) | RECTAL | Status: DC | PRN

## 2024-01-30 MED ORDER — MEGESTROL ACETATE 40 MG PO TABS
40.0000 mg | ORAL_TABLET | Freq: Every day | ORAL | Status: DC
  Administered 2024-01-30 – 2024-02-07 (×9): 40 mg via ORAL
  Filled 2024-01-30 (×9): qty 1

## 2024-01-30 MED ORDER — ROPINIROLE HCL 1 MG PO TABS
1.0000 mg | ORAL_TABLET | Freq: Every day | ORAL | Status: DC
  Administered 2024-01-31 – 2024-02-06 (×7): 1 mg via ORAL
  Filled 2024-01-30 (×7): qty 1

## 2024-01-30 MED ORDER — SODIUM BICARBONATE 8.4 % IV SOLN
100.0000 meq | Freq: Once | INTRAVENOUS | Status: AC
  Administered 2024-01-30: 100 meq via INTRAVENOUS
  Filled 2024-01-30: qty 50
  Filled 2024-01-30: qty 100

## 2024-01-30 MED ORDER — SODIUM BICARBONATE 8.4 % IV SOLN
100.0000 meq | Freq: Once | INTRAVENOUS | Status: AC
  Administered 2024-01-30: 100 meq via INTRAVENOUS
  Filled 2024-01-30: qty 100

## 2024-01-30 MED ORDER — ATORVASTATIN CALCIUM 40 MG PO TABS
40.0000 mg | ORAL_TABLET | Freq: Every day | ORAL | Status: DC
  Administered 2024-01-30 – 2024-02-07 (×9): 40 mg via ORAL
  Filled 2024-01-30 (×9): qty 1

## 2024-01-30 MED ORDER — SODIUM ZIRCONIUM CYCLOSILICATE 10 G PO PACK
10.0000 g | PACK | Freq: Once | ORAL | Status: AC
  Administered 2024-01-31: 10 g via ORAL
  Filled 2024-01-30: qty 1

## 2024-01-30 MED ORDER — ROPINIROLE HCL 1 MG PO TABS: 1.0000 mg | ORAL_TABLET | Freq: Every day | ORAL | Status: DC

## 2024-01-30 MED ORDER — APIXABAN 2.5 MG PO TABS
2.5000 mg | ORAL_TABLET | Freq: Two times a day (BID) | ORAL | Status: DC
  Administered 2024-01-30 – 2024-02-07 (×17): 2.5 mg via ORAL
  Filled 2024-01-30 (×17): qty 1

## 2024-01-30 MED ORDER — LACTATED RINGERS IV BOLUS
500.0000 mL | Freq: Once | INTRAVENOUS | Status: AC
  Administered 2024-01-30: 500 mL via INTRAVENOUS

## 2024-01-30 MED ORDER — SODIUM ZIRCONIUM CYCLOSILICATE 10 G PO PACK
10.0000 g | PACK | Freq: Once | ORAL | Status: AC
  Administered 2024-01-30: 10 g via ORAL
  Filled 2024-01-30: qty 1

## 2024-01-30 MED ORDER — SODIUM CHLORIDE 0.9 % IV SOLN
1.0000 g | INTRAVENOUS | Status: DC
  Administered 2024-01-30 – 2024-02-01 (×3): 1 g via INTRAVENOUS
  Filled 2024-01-30 (×4): qty 10

## 2024-01-30 MED ORDER — SODIUM ZIRCONIUM CYCLOSILICATE 5 G PO PACK
5.0000 g | PACK | Freq: Once | ORAL | Status: AC
Start: 2024-01-30 — End: 2024-01-30
  Administered 2024-01-30: 5 g via ORAL
  Filled 2024-01-30: qty 1

## 2024-01-30 MED ORDER — NYSTATIN 100000 UNIT/GM EX POWD
1.0000 | Freq: Two times a day (BID) | CUTANEOUS | Status: DC
  Administered 2024-01-30 – 2024-02-07 (×18): 1 via TOPICAL
  Filled 2024-01-30 (×2): qty 15

## 2024-01-30 MED ORDER — VANCOMYCIN HCL 2000 MG/400ML IV SOLN
2000.0000 mg | Freq: Once | INTRAVENOUS | Status: AC
  Administered 2024-01-30: 2000 mg via INTRAVENOUS
  Filled 2024-01-30: qty 400

## 2024-01-30 MED ORDER — ACETAMINOPHEN 325 MG PO TABS
650.0000 mg | ORAL_TABLET | Freq: Four times a day (QID) | ORAL | Status: DC | PRN
  Administered 2024-02-01 – 2024-02-03 (×3): 650 mg via ORAL
  Filled 2024-01-30 (×3): qty 2

## 2024-01-30 MED ORDER — VANCOMYCIN HCL IN DEXTROSE 1-5 GM/200ML-% IV SOLN
1000.0000 mg | INTRAVENOUS | Status: DC
  Administered 2024-02-01: 1000 mg via INTRAVENOUS
  Filled 2024-01-30: qty 200

## 2024-01-30 NOTE — ED Notes (Signed)
 Lactic acid flagged esther r.rn notified by at

## 2024-01-30 NOTE — Assessment & Plan Note (Addendum)
 Echo 01/14/2023 with LVEF 50%, LV mildly decreased function and regional WMA, RV systolic function mildly reduced, moderately elevated pulmonary artery systolic pressure.  Sees HF outpatient. -Holding home spironolactone, carvedilol, torsemide in setting of hypotension -Strict I/Os, daily weights -Caution with fluid resuscitation, s/p 2L in ED -Follow up on metolazone 2.5 mg per HF -HF consulted, follow up recommendations

## 2024-01-30 NOTE — Assessment & Plan Note (Addendum)
 Urinary frequency and urgency x 2 days.  Urinalysis in the ED showing UTI.  Received 1 dose of ceftriaxone in the ED. -PureWick ordered -Ceftriaxone 1g q24h -Urine culture pending -Strict I&Os

## 2024-01-30 NOTE — Progress Notes (Signed)
 PT Cancellation Note  Patient Details Name: [name] MRN: [id number] DOB: Aug 16, 1941   Cancelled Treatment:    Reason Eval/Treat Not Completed: Medical issues which prohibited therapy (Currently pt K+ is 7.5. Holding at this time. RN about to perform second draw. Will follow up as able and appropriate.)  Harrel Carina, DPT, CLT  Acute Rehabilitation Services Office: [phone] (Secure chat preferred)   Claudia Desanctis 01/30/2024, 10:59 AM

## 2024-01-30 NOTE — Plan of Care (Signed)
  Problem: Education: Goal: Ability to describe self-care measures that may prevent or decrease complications (Diabetes Survival Skills Education) will improve Outcome: Progressing   Problem: Coping: Goal: Ability to adjust to condition or change in health will improve Outcome: Progressing   Problem: Fluid Volume: Goal: Ability to maintain a balanced intake and output will improve Outcome: Progressing   Problem: Health Behavior/Discharge Planning: Goal: Ability to identify and utilize available resources and services will improve Outcome: Progressing   Problem: Nutritional: Goal: Maintenance of adequate nutrition will improve Outcome: Progressing   Problem: Skin Integrity: Goal: Risk for impaired skin integrity will decrease Outcome: Progressing   Problem: Tissue Perfusion: Goal: Adequacy of tissue perfusion will improve Outcome: Progressing   

## 2024-01-30 NOTE — ED Notes (Signed)
 Message sent to pharmacy to sent vancomycin to the green zone tube station.

## 2024-01-30 NOTE — Assessment & Plan Note (Addendum)
 Was seen by PCP 3/19 for wound to right lower leg, sent home with doxycycline for cellulitis which she did not have a chance to start before coming to the ED. Pt had ABIs done on 3/13, deemed stable and planned for Unna boot placement by cardiology. Pt afebrile, but with leukocytosis 22.9 and lactic acid 2.4. Xrays of bilateral tibfibs unremarkable.  Upon chart review the photos it does appear that her cellulitis rapidly worsened since leaving clinic. -Admitted to FMTS, progressive, Dr. Linwood Dibbles attending - Vancomycin per pharmacy - Wound care consult placed - Trend lactic acid - Tylenol 650 mg every 6 hours as needed for pain - Blood cultures pending - Fall precautions - AM CBC, BMP

## 2024-01-30 NOTE — Assessment & Plan Note (Addendum)
 On trulicity at home. Last A1C 8.2 06/2022. - Very sensitive sliding scale, unsure what current renal function is but has CKD 4 - CBGs before meals and at bedtime - BMP pending - A1c in a.m.

## 2024-01-30 NOTE — Hospital Course (Addendum)
 [name] is a 83 y.o.female with a history of CAD, CKD 4, hypertension, A-fib, restless leg syndrome, intertrigo, CVA, OSA, diabetes, CHF who was admitted to the family medicine teaching Service at [facility] for generalized weakness. Her hospital course is detailed below:  RLE Cellulitis Seen by Dr. Phineas Real in clinic on 3/19 and discharged with course of doxycycline for cellulitis of right lower extremity.  Patient's pain and erythema of right lower extremity progressed after leaving clinic.  On arrival to ED patient had leukocytosis, elevated lactic acid, and obviously worsened cellulitis of the right lower extremity with concern for sepsis.  Lactic acid trended down with IVF.  Blood cultures with no growth.  Patient was started on vancomycin 3/20.  Wound care consulted who provided recommendations as below.  UTI Patient had urinary frequency and urgency x 2 days prior to admission.  Urinalysis in the ED showed large hemoglobin, large leukocytes, positive nitrates.  Patient received IV Ceftriaxone (3/19-3/23) and was transitioned to cephalexin for a total 7 day course (3/20-3/26), which was completed prior to discharge.  HFrEF exacerbation Concern for worsening BLE swelling by patient.  Home Spironolactone, Coreg, Torsemide were held initially due to hypotension. She did receive 1.5 L fluid bolus due to hypotension in setting of sepsis, resulting in iatrogenic CHF exacerbation.  Heart failure team consulted who recommended diuresis with IV Lasix 80mg .  Echo showed LVEF 35-40% with left ventricle global hypokinesis; mild mitral valve regurgitation.  Cardiology noted this may be due to PVC burden, which was up to 19% on monitor and and metoprolol succinate 25 mg daily was started, after which PVC burden decreased to 6%.  Briefly required O2, but was stable on RA at discharge.  Torsemide 80 mg BID was started prior to discharge.  Hyperkalemia Pt found to be hyperkalemic >7.5 on admission.  Suspected to be in  the setting of excessive PO potassium supplementation at home. Pt never had EKG changes from her hyperkalemia.  She received multiple rounds of Lokelma, sodium bicarb, and insulin/D50 over the course of 24 hours.  By the time of discharge her potassium had normalized for multiple days.  Generalized weakness Presented initially with generalized weakness x several months but worsening in the last day or so.  Suspected to be in the setting of acute infections (cellulitis and UTI).  PT/OT saw and evaluated pt and recommended SNF.  Weakness improved by discharge.  Other chronic conditions were medically managed with home medications and formulary alternatives as necessary (vaginal bleeding, CVA, intertrigo, RLS, afib, T2DM).  PCP Follow-up Recommendations: Discontinued potassium and spironolactone on discharge given hyperkalemia, recheck BMP in 1 week to ensure stable K. Outpatient follow up with Cardiology (Dr. Freida Busman), considering CardioMEMS implantation. Please ensure wound care of BLE wounds with daily dressing changes: Recommend Vashe wound cleanser, allow to air dry, then apply Xerofrom gauze followed by Kerlix for light compression.

## 2024-01-30 NOTE — ED Notes (Addendum)
 Received called from lab stating that patient's potassium level was greater than 7. Contacted Carlene Coria, DO and Vonna Drafts, MD regarding call. Stated to reorder a CMP lab.

## 2024-01-30 NOTE — Consult Note (Addendum)
 WOC Nurse Consult Note: patient has been followed by family medicine for venous insufficiency and has had referral to vascular; ABIs performed 01/23/2024 revealing noncompressible arteries  Reason for Consult: leg wounds  Wound type: full thickness r/t venous insufficiency  Pressure Injury POA: NA  Measurement: see nursing flowsheet Wound bed:  R leg full thickness 60% brown necrotic 40% pink; L leg appears pink and moist (photo documentation wound with bloody drainage)   Drainage (amount, consistency, odor) tan exudate from R lateral leg wound; sanguinous drainage from L leg wound  Periwound: edema, erythema changes consistent with venous stasis  Dressing procedure/placement/frequency: Cleanse B lower legs (intact skin and ulcerations) with Vashe wound cleanser [employer] [place] [phone]) do not rinse, allow to air dry. Apply Xeroform gauze [employer] [place] [phone]) to wound beds daily, cover with ABD pads and Kerlix roll gauze beginning right above toes and ending right below knees.  May secure dressing with Ace bandage wrapped in same fashion as Kerlix for some light compression. Patient should elevate legs as much as possible.    Patient should continue follow-up with vascular surgeon for ongoing care of these wounds and her venous insufficiency.   POC discussed with bedside nurse. WOC team will not follow. Re-consult if further needs arise.   Thank you,    Priscella Mann MSN, RN-BC, [employer] [phone]

## 2024-01-30 NOTE — Plan of Care (Signed)
 FMTS Brief Progress Note  S: Evaluated patient at bedside for nighttime rounds.  She is confused about what time it is and wondering where her son is.  She does not know how to use her room phone.  No complaints at this time.   O: BP (!) 121/110 (BP Location: Right Wrist)   Pulse 84   Temp 97.9 F (36.6 C) (Oral)   Resp (!) 21   Ht 5\' 8"  (1.727 m)   Wt 98.8 kg   LMP  (LMP Unknown)   SpO2 95%   BMI 33.12 kg/m   GEN: Well-appearing, no acute distress CV: Regular rhythm, regular rate, no murmurs Respiratory: Normal work of breathing, CTAB Neuro: Disoriented but oriented to self  A/P: Delirium Patient is alert and oriented to self, but says she forgot where she is and does not know the time.  She is asking where her son is.  I shut her blinds, turn the lights off, and let her know that it is nighttime.  Also called her son from her room phone and patient was able to speak with him.  She was much more calm and oriented when I left the room.  I suspect this is delirium in the setting of underlying dementia  Hyperkalemia - Last potassium 6 at 1727, repeat BMP is in process - S/p Lokelma, Novolog, Sodium bicarb today - Will address potassium again once BMP results - Reassuringly, lactic acid is trending down  - Orders reviewed. Labs for AM ordered, which was adjusted as needed.  - If condition changes, plan includes re-evaluation, CCM if remains hypotensive with critical hyperkalemia.   Yigit Norkus, Tamala Julian, DO 01/30/2024, 10:26 PM PGY-1, [facility] Night Resident  Please page [phone] with questions.

## 2024-01-30 NOTE — Care Plan (Signed)
 I called lab to follow-up on lactic acid collected today 3/20 at 1430.  Lab says that the orders were cancelled.  I re-ordered a STAT serum lactic acid and called RN to ask to draw as soon as possible.  Plan to go see patient for night rounds shortly.  Darral Dash, DO

## 2024-01-30 NOTE — ED Notes (Signed)
 Patient has been cleaned and changed. Patient was repositioned.

## 2024-01-30 NOTE — ED Notes (Signed)
 Message sent to main pharmacy regarding verification of patient's nystatin and sending it to green zone tube station.

## 2024-01-30 NOTE — Progress Notes (Signed)
 Patient brought to 4E from ED. Telemetry box applied, CCMD notified. Patient oriented to room and staff. Call bell in reach.   01/30/24 1610  Vitals  Temp 98.1 F (36.7 C)  Temp Source Oral  BP (!) 92/43  MAP (mmHg) (!) 60  BP Location Left Wrist  BP Method Automatic  Patient Position (if appropriate) Lying  Pulse Rate 88  Pulse Rate Source Monitor  ECG Heart Rate 87  Resp 20  MEWS COLOR  MEWS Score Color Green  Oxygen Therapy  SpO2 96 %  O2 Device Room Air  MEWS Score  MEWS Temp 0  MEWS Systolic 1  MEWS Pulse 0  MEWS RR 0  MEWS LOC 0  MEWS Score 1

## 2024-01-30 NOTE — Consult Note (Signed)
 CARDIOLOGY CONSULT NOTE       Patient ID: [name] MRN: [id number] DOB/AGE: [birth year] 83 y.o.  Admit date: 01/29/2024 Referring Physician: Vonna Drafts Primary Physician: Evette Georges, MD Primary Cardiologist: McLean/Turner Reason for Consultation: AFib/CHF  Principal Problem:   Cellulitis of right leg Active Problems:   CHF (congestive heart failure) (HCC)   Urinary tract infection   Generalized weakness   Type 2 diabetes mellitus (HCC)   Hyperkalemia   Chronic health problem   HPI:  83 y.o. being admitted to [facility] for non cardiac related reasons. Has had weakness and found to have large area of cellulitis in RLE, UTI and hyperkalemia. She was to see Dr Shirlee Latch in CHF clinic this am. Her weight has been stable no change in chronic dyspnea She is obese and sedentary With cellulitis RLE more edematous. . She has been compliant with her meds Her aldactone will have to be held until her K is reduced On admission K > 7.5 with BUN42 and Cr 1.86 which is actually improved for her. She has received insulin/glucose and a dose of Lokelma. Repeat BMET is pending No bradycardia noted in afib. She has had distant CABG 2009 with no angina. Most recent TTE done 01/14/23 with EF 50% mildly reduced RV mild MR, mild/mod TR and mild AR She has venous stasis with chronic bullous edema. RLE now with large area of cellulitis and demarcated erythema. She had prior stroke on coumadin now on elqiuis. Basline walks with walker and weight around 222 lbs. She inquired about Watchman but given obesity with co morbidities and no major bleeding issues doubt she would be a canddiate  ROS All other systems reviewed and negative except as noted above  Past Medical History:  Diagnosis Date   Acute cystitis with hematuria 09/01/2020   ANXIETY 01/31/2009   Qualifier: Diagnosis of  By: Cristela Felt, CNA, Christy     Arthritis    "qwhere" (03/30/2016)   Bilateral carpal tunnel syndrome 02/13/2021   CAD (coronary artery  disease) 05/2008   a. s/p CABG in 2009.   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 Postoperative in 2009;  S/P transesophageal echocardiography-guided cardioversion, previously on Amiodarone and Coumadin. b. recurrent in April 2013 -> pt elected rate control strategy, initially Coumadin -> had stroke with subtherapeutic INR in 03/2016 and transitioned to Eliquis.   Macular degeneration    Melena 01/10/2021   Morbid obesity (HCC) 03/26/2017   OSA on CPAP    severe OSA with AHI 34/hr   Proliferative diabetic retinopathy of right eye (HCC) 04/17/2021   Found today at delivery of PRP for what was thought to be severe NPDR OD   Rectal bleeding    Thrombocytopenia (  HCC)     Family History  Problem Relation Age of Onset   Clotting disorder Mother        Cerebral hemorrhage   Other Mother        cerebral hemorrhage   Emphysema Father        COD   Lung cancer Brother    Heart disease Neg Hx     Social History   Socioeconomic History   Marital status: Widowed    Spouse name: Not on file   Number of children: 2   Years of education: 7   Highest education level: Not on file  Occupational History    Comment: retired  Tobacco Use    Smoking status: Never    Passive exposure: Never   Smokeless tobacco: Never  Vaping Use   Vaping status: Never Used  Substance and Sexual Activity   Alcohol use: No   Drug use: Never   Sexual activity: Not Currently  Other Topics Concern   Not on file  Social History Narrative   Widowed   01/27/21 Lives with son,  in [place]   2 children   Not routinely exercising   Social Drivers of Health   Financial Resource Strain: Low Risk  (05/06/2023)   Overall Financial Resource Strain (CARDIA)    Difficulty of Paying Living Expenses: Not hard at all  Food Insecurity: No Food Insecurity (01/17/2024)   Hunger Vital Sign    Worried About Running Out of Food in the Last Year: Never true    Ran Out of Food in the Last Year: Never true  Transportation Needs: No Transportation Needs (01/17/2024)   PRAPARE - Administrator, Civil Service (Medical): No    Lack of Transportation (Non-Medical): No  Physical Activity: Inactive (09/24/2023)   Exercise Vital Sign    Days of Exercise per Week: 0 days    Minutes of Exercise per Session: 0 min  Stress: No Stress Concern Present (05/06/2023)   [employer] of Occupational Health - Occupational Stress Questionnaire    Feeling of Stress : Not at all  Social Connections: Socially Isolated (09/24/2023)   Social Connection and Isolation Panel [NHANES]    Frequency of Communication with Friends and Family: More than three times a week    Frequency of Social Gatherings with Friends and Family: Once a week    Attends Religious Services: Never    Database administrator or Organizations: No    Attends Banker Meetings: Never    Marital Status: Widowed  Intimate Partner Violence: Not At Risk (01/17/2024)   Humiliation, Afraid, Rape, and Kick questionnaire    Fear of Current or Ex-Partner: No    Emotionally Abused: No    Physically Abused: No    Sexually Abused: No    Past Surgical History:  Procedure Laterality Date    APPENDECTOMY  1946   BACK SURGERY     CARPAL TUNNEL RELEASE Left 03/28/2021   Procedure: LEFT CARPAL TUNNEL RELEASE;  Surgeon: Cindee Salt, MD;  Location: [facility] OR;  Service: Orthopedics;  Laterality: Left;  60 MIN   CARPAL TUNNEL RELEASE Right 09/15/2021   Procedure: RIGHT CARPAL TUNNEL RELEASE;  Surgeon: Cindee Salt, MD;  Location: [facility] OR;  Service: Orthopedics;  Laterality: Right;   CATARACT EXTRACTION Right 2012   COLONOSCOPY WITH PROPOFOL Left 03/27/2017   Procedure: COLONOSCOPY WITH PROPOFOL;  Surgeon: Kerin Salen, MD;  Location: [facility] ENDOSCOPY;  Service: Gastroenterology;  Laterality: Left;   CORONARY ANGIOPLASTY WITH  STENT PLACEMENT  2009   "put 2 stents in"   EXCISION METACARPAL MASS Left 03/28/2021   Procedure: EXCISION MASS LEFT THUMB;  Surgeon: Cindee Salt, MD;  Location: [facility] OR;  Service: Orthopedics;  Laterality: Left;   EYE SURGERY     bilateral cataract   HERNIA REPAIR     IR LUMBAR DISC ASPIRATION W/IMG GUIDE  10/08/2018   JOINT REPLACEMENT     TEE WITH CARDIOVERSION     Postoperative in 2009;  S/P transesophageal echocardiography-guided cardioversion,   TOTAL KNEE ARTHROPLASTY Right 1994   VENTRAL HERNIA REPAIR  10/1999   With mesh      Current Facility-Administered Medications:    acetaminophen (TYLENOL) tablet 650 mg, 650 mg, Oral, Q6H PRN **OR** acetaminophen (TYLENOL) suppository 650 mg, 650 mg, Rectal, Q6H PRN, Everhart, Kirstie, DO   apixaban (ELIQUIS) tablet 2.5 mg, 2.5 mg, Oral, BID, Everhart, Kirstie, DO, 2.5 mg at 01/30/24 1021   atorvastatin (LIPITOR) tablet 40 mg, 40 mg, Oral, Daily, Everhart, Kirstie, DO, 40 mg at 01/30/24 1021   cefTRIAXone (ROCEPHIN) 1 g in sodium chloride 0.9 % 100 mL IVPB, 1 g, Intravenous, Q24H, Spence, Sarah, DO   insulin aspart (novoLOG) injection 0-6 Units, 0-6 Units, Subcutaneous, TID WC, Everhart, Kirstie, DO   megestrol (MEGACE) tablet 40 mg, 40 mg, Oral, Daily, Everhart, Kirstie, DO, 40 mg at 01/30/24 1021   nystatin  (MYCOSTATIN/NYSTOP) topical powder 1 Application, 1 Application, Topical, BID, Everhart, Kirstie, DO, 1 Application at 01/30/24 0912   [START ON 01/31/2024] rOPINIRole (REQUIP) tablet 1 mg, 1 mg, Oral, QHS, Everhart, Kirstie, DO   [START ON 02/01/2024] vancomycin (VANCOCIN) IVPB 1000 mg/200 mL premix, 1,000 mg, Intravenous, Q48H, Rumball, Alison M, DO  Current Outpatient Medications:    acetaminophen (TYLENOL 8 HOUR) 650 MG CR tablet, Take 1 tablet (650 mg total) by mouth every 8 (eight) hours as needed for pain., Disp: 30 tablet, Rfl: 0   apixaban (ELIQUIS) 2.5 MG TABS tablet, Take 1 tablet (2.5 mg total) by mouth 2 (two) times daily., Disp: 180 tablet, Rfl: 3   atorvastatin (LIPITOR) 40 MG tablet, Take 1 tablet (40 mg total) by mouth daily., Disp: 100 tablet, Rfl: 2   brimonidine (ALPHAGAN) 0.2 % ophthalmic solution, Place 1 drop into both eyes 2 (two) times daily., Disp: , Rfl:    carvedilol (COREG) 3.125 MG tablet, Take 1 tablet (3.125 mg total) by mouth 2 (two) times daily with a meal., Disp: 60 tablet, Rfl: 11   Cholecalciferol 25 MCG (1000 UT) tablet, Take 1,000 Units by mouth daily., Disp: , Rfl:    clotrimazole (LOTRIMIN AF) 1 % cream, Apply 1 Application topically 2 (two) times daily., Disp: 113 g, Rfl: 1   diclofenac Sodium (VOLTAREN) 1 % GEL, Apply 2 g topically as needed., Disp: 100 g, Rfl: 1   docusate sodium (COLACE) 100 MG capsule, Take 200 mg by mouth at bedtime., Disp: , Rfl:    doxycycline (VIBRAMYCIN) 100 MG capsule, Take 100 mg by mouth 2 (two) times daily., Disp: , Rfl:    Dulaglutide (TRULICITY) 1.5 MG/0.5ML SOAJ, Inject 1.5 mg into the skin once a week., Disp: 2 mL, Rfl: 0   ferrous sulfate 325 (65 FE) MG tablet, TAKE 1 TABLET BY MOUTH EVERY DAY WITH BREAKFAST, Disp: 90 tablet, Rfl: 1   lidocaine (LIDODERM) 5 %, Place 1 patch onto the skin daily. Remove & Discard patch within 12 hours or as directed by MD (Patient taking differently: Place 1 patch onto the skin daily  as needed.  Remove & Discard patch within 12 hours or as directed by MD), Disp: 30 patch, Rfl: 0   megestrol (MEGACE) 40 MG tablet, Take 1 tablet (40 mg total) by mouth daily., Disp: 30 tablet, Rfl: 0   metolazone (ZAROXOLYN) 2.5 MG tablet, Take only as directed by Heart Failure Clinic (Patient taking differently: 5 mg. Take only as directed by Heart Failure Clinic), Disp: 6 tablet, Rfl: 1   Multiple Vitamins-Minerals (PRESERVISION AREDS 2+MULTI VIT PO), Take 1 capsule by mouth in the morning and at bedtime., Disp: , Rfl:    nitroGLYCERIN (NITROSTAT) 0.4 MG SL tablet, Place 1 tablet (0.4 mg total) under the tongue every 5 (five) minutes as needed for chest pain (up to 3 doses)., Disp: 15 tablet, Rfl: 5   potassium chloride SA (KLOR-CON M) 20 MEQ tablet, Take 3 tablets (60 mEq total) by mouth 2 (two) times daily., Disp: 180 tablet, Rfl: 2   rOPINIRole (REQUIP) 0.5 MG tablet, Take 2 tablets (1 mg total) by mouth at bedtime., Disp: 180 tablet, Rfl: 3   spironolactone (ALDACTONE) 25 MG tablet, Take 0.5 tablets (12.5 mg total) by mouth daily., Disp: 45 tablet, Rfl: 3   torsemide (DEMADEX) 100 MG tablet, Take 1 tablet (100 mg total) by mouth 2 (two) times daily., Disp: 180 tablet, Rfl: 3   traMADol (ULTRAM) 50 MG tablet, Take 1 tablet (50 mg total) by mouth every 12 (twelve) hours as needed for severe pain (pain score 7-10) (Can take every 6 hours for breakthrough pain.)., Disp: 60 tablet, Rfl: 1   vitamin B-12 (CYANOCOBALAMIN) 1000 MCG tablet, Take 1 tablet (1,000 mcg total) by mouth daily., Disp: 90 tablet, Rfl: 1   zinc oxide 20 % ointment, Apply 1 application topically as needed (buttocks irritation)., Disp: 425 g, Rfl: 1   Blood Glucose Monitoring Suppl (ONE TOUCH ULTRA 2) w/Device KIT, USE TO CHECK BLOOD SUGAR UPTO 3 TIMES A DAY, Disp: 1 kit, Rfl: 0   FUROSCIX 80 MG/10ML CTKT, Inject 80 mg into the skin daily. For 3 days. (Patient not taking: Reported on 01/30/2024), Disp: , Rfl:    glucose blood (ONE TOUCH ULTRA  TEST) test strip, Use three times a day, Disp: 100 each, Rfl: 3   glucose blood test strip, Use as instructed to check blood glucose up to 4x daily, Disp: 100 each, Rfl: 12   Lancets (ONETOUCH DELICA PLUS LANCET33G) MISC, Please use to check blood sugar up to four times daily., Disp: 100 each, Rfl: 6   nystatin (MYCOSTATIN/NYSTOP) powder, Apply 1 Application topically 3 (three) times daily. Until resolution. (Patient not taking: Reported on 01/30/2024), Disp: 15 g, Rfl: 0  apixaban  2.5 mg Oral BID   atorvastatin  40 mg Oral Daily   insulin aspart  0-6 Units Subcutaneous TID WC   megestrol  40 mg Oral Daily   nystatin  1 Application Topical BID   [START ON 01/31/2024] rOPINIRole  1 mg Oral QHS    cefTRIAXone (ROCEPHIN)  IV     [START ON 02/01/2024] vancomycin      Physical Exam: Blood pressure (!) 134/36, pulse 99, temperature 98.8 F (37.1 C), temperature source Oral, resp. rate (!) 23, SpO2 100%.    Obese in no distress Large area of demarcated cellulitis in RLE Plus 2 bilateral edema Distant heart sounds JVP elevated with V wave Lungs clear Abdomen benign Incontinent of urine  Labs:   Lab Results  Component Value Date   WBC 24.5 (H) 01/30/2024  HGB 9.8 (L) 01/30/2024   HCT 31.6 (L) 01/30/2024   MCV 94.9 01/30/2024   PLT 144 (L) 01/30/2024    Recent Labs  Lab 01/30/24 0450  NA 134*  K >7.5*  CL 116*  CO2 13*  BUN 42*  CREATININE 1.86*  CALCIUM 9.6  PROT 5.9*  BILITOT 1.0  ALKPHOS 73  ALT 9  AST 15  GLUCOSE 146*   Lab Results  Component Value Date   CKTOTAL 31 (L) 10/09/2018   TROPONINI <0.03 03/30/2016    Lab Results  Component Value Date   CHOL 77 01/23/2024   CHOL 107 01/14/2023   CHOL 103 10/18/2021   Lab Results  Component Value Date   HDL 33 (L) 01/23/2024   HDL 51 01/14/2023   HDL 48 10/18/2021   Lab Results  Component Value Date   LDLCALC 33 01/23/2024   LDLCALC 48 01/14/2023   LDLCALC 47 10/18/2021   Lab Results  Component Value  Date   TRIG 54 01/23/2024   TRIG 40 01/14/2023   TRIG 41 10/18/2021   Lab Results  Component Value Date   CHOLHDL 2.3 01/23/2024   CHOLHDL 2.1 01/14/2023   CHOLHDL 2.1 10/18/2021   No results found for: "LDLDIRECT"    Radiology: CT Head Wo Contrast Result Date: 01/29/2024 CLINICAL DATA:  Generalized weakness.  Female status changes. EXAM: CT HEAD WITHOUT CONTRAST TECHNIQUE: Contiguous axial images were obtained from the base of the skull through the vertex without intravenous contrast. RADIATION DOSE REDUCTION: This exam was performed according to the departmental dose-optimization program which includes automated exposure control, adjustment of the mA and/or kV according to patient size and/or use of iterative reconstruction technique. COMPARISON:  04/06/2016 FINDINGS: Brain: Encephalomalacia in the right basal ganglia in the area of prior hemorrhage. There is atrophy and chronic small vessel disease changes. No acute intracranial abnormality. Specifically, no hemorrhage, hydrocephalus, mass lesion, acute infarction, or significant intracranial injury. Vascular: No hyperdense vessel or unexpected calcification. Skull: No acute calvarial abnormality. Sinuses/Orbits: No acute findings Other: None IMPRESSION: Atrophy, chronic microvascular disease. No acute intracranial abnormality. Electronically Signed   By: Charlett Nose M.D.   On: 01/29/2024 17:19   DG Tibia/Fibula Right Result Date: 01/29/2024 CLINICAL DATA:  Lower extremity swelling EXAM: RIGHT TIBIA AND FIBULA - 2 VIEW COMPARISON:  None Available. FINDINGS: Diffuse soft tissue swelling. Prior right knee replacement. No acute bony abnormality. Specifically, no fracture, subluxation, or dislocation. Vascular calcifications noted. Degenerative changes in the right ankle. IMPRESSION: Prior right knee replacement. No acute bony abnormality. Electronically Signed   By: Charlett Nose M.D.   On: 01/29/2024 17:17   DG Tibia/Fibula Left Result Date:  01/29/2024 CLINICAL DATA:  Leg swelling EXAM: LEFT TIBIA AND FIBULA - 2 VIEW COMPARISON:  None Available. FINDINGS: Diffuse soft tissue swelling. No acute bony abnormality. Specifically, no fracture, subluxation, or dislocation. Degenerative changes in the left knee and ankle. Vascular calcifications diffusely. IMPRESSION: No acute bony abnormality. Electronically Signed   By: Charlett Nose M.D.   On: 01/29/2024 17:17   DG Chest Port 1 View Result Date: 01/29/2024 CLINICAL DATA:  Generalized weakness, leg swelling EXAM: PORTABLE CHEST 1 VIEW COMPARISON:  10/06/2018 FINDINGS: Prior CABG. Heart and mediastinal contours are within normal limits. No focal opacities or effusions. No acute bony abnormality. Degenerative changes in the shoulders. IMPRESSION: No active cardiopulmonary disease. Electronically Signed   By: Charlett Nose M.D.   On: 01/29/2024 17:16   VAS [place] ABI WITH/WO TBI Result Date: 01/23/2024  LOWER EXTREMITY DOPPLER STUDY Patient Name:  TERRILL WAUTERS  Date of Exam:   01/23/2024 Medical Rec #: [id number]       Accession #:    [id number] Date of Birth: Aug 11, 1941       Patient Gender: F Patient Age:   5 years Exam Location:  [facility] Procedure:      VAS [place] ABI WITH/WO TBI Referring Phys: ALMA DIAZ --------------------------------------------------------------------------------  Indications: Ulceration. High Risk Factors: Hypertension, hyperlipidemia, Diabetes, no history of                    smoking, coronary artery disease, prior CVA. Other Factors: Venous stasis.  Performing Technologist: Dondra Prader RVT RCS  Examination Guidelines: A complete evaluation includes at minimum, Doppler waveform signals and systolic blood pressure reading at the level of bilateral brachial, anterior tibial, and posterior tibial arteries, when vessel segments are accessible. Bilateral testing is considered an integral part of a complete examination. Photoelectric Plethysmograph (PPG) waveforms and toe systolic  pressure readings are included as required and additional duplex testing as needed. Limited examinations for reoccurring indications may be performed as noted.  ABI Findings: +--------+------------------+-----+----------+--------+ Right   Rt Pressure (mmHg)IndexWaveform  Comment  +--------+------------------+-----+----------+--------+ [id number]                                       +--------+------------------+-----+----------+--------+ PTA     230               1.35 monophasic         +--------+------------------+-----+----------+--------+ DP      226               1.33 monophasic         +--------+------------------+-----+----------+--------+ +--------+------------------+-----+----------+-------+ Left    Lt Pressure (mmHg)IndexWaveform  Comment +--------+------------------+-----+----------+-------+ [id number]                                      +--------+------------------+-----+----------+-------+ PTA     252               1.48 monophasic        +--------+------------------+-----+----------+-------+ DP      244               1.44 monophasic        +--------+------------------+-----+----------+-------+  Summary: Right: Resting right ankle-brachial index indicates noncompressible right lower extremity arteries. Left: Resting left ankle-brachial index indicates noncompressible left lower extremity arteries. *See table(s) above for measurements and observations.  Electronically signed by Sherald Hess MD on 01/23/2024 at 12:14:56 PM.    Final     EKG: afib nonspecific ST changes    ASSESSMENT AND PLAN:   CHF:  EF 50% update TTE. Continue home demadex 100 mg bid. Hold aldactone  Afib:  rates fine hold coreg until hyperkalemia resolved continue low dose eliquis 2.5 mg bid Hyperkalemia:  per nephrology and primary service repeat BMET pending has had insulin/glucose with Lokelma x 1 no bradycardia on telemetry or ECG Cellulitis:  in setting of chronic  venous stasis and edema. On Rocephin iv  Sepsis with lactate 2.9 and WBC 24.5 CCM seeing avoid excess diuresis for now given soft BP CRF:  stable with Cr  1.86 eliqus dose adjusted for afib and prior CVA  Signed: Charlton Haws 01/30/2024, 10:58 AM

## 2024-01-30 NOTE — Consult Note (Signed)
 NAME:  [name], MRN:  [id number], DOB:  1941/02/24, LOS: 0 ADMISSION DATE:  01/29/2024, CONSULTATION DATE: 01/30/24  REFERRING MD:  MD Resident Atif with FMTS  CHIEF COMPLAINT:  Generalized Weakness   History of Present Illness:  Pt is a an 83 yr female significant pmhx of CHF, CKD stage III, DM2, diabetic neuropathy, GERD, CVA 2018, Afib on eliquis, OSA, and Thrombocytopenia who presented to [facility] ED on 01/29/24 with generalized weakness, and superficial pain of the right lower leg. Per ED workup, patient found to have cellulitis of right leg and an acute UTI. Initial labs showing lactic acid of 2.4, leukocytosis, with Cr 1.86, and BUN 42, and NAGMA. Most recent chemistry with repeat revealed hyperkalemia with K 7.5. PCCM consulted for recommendations to assist in treatment with hyperkalemia and intermittent low blood pressures.   Pertinent  Medical History   Past Medical History:  Diagnosis Date   Acute cystitis with hematuria 09/01/2020   ANXIETY 01/31/2009   Qualifier: Diagnosis of  By: Cristela Felt, CNA, Christy     Arthritis    "qwhere" (03/30/2016)   Bilateral carpal tunnel syndrome 02/13/2021   CAD (coronary artery disease) 05/2008   a. s/p CABG in 2009.   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 Postoperative in 2009;  S/P transesophageal echocardiography-guided cardioversion, previously on Amiodarone and Coumadin. b. recurrent in April 2013 -> pt elected rate control strategy, initially Coumadin -> had stroke with subtherapeutic INR in 03/2016 and transitioned to Eliquis.   Macular degeneration    Melena 01/10/2021   Morbid obesity (HCC) 03/26/2017   OSA on CPAP    severe OSA with AHI 34/hr   Proliferative diabetic retinopathy of right eye (HCC) 04/17/2021   Found today at delivery of PRP for what was thought to be severe NPDR OD   Rectal bleeding    Thrombocytopenia (HCC)      Significant Hospital Events: Including procedures, antibiotic start and stop dates in addition to other pertinent events   3/19 Admit to FMTS for right leg cellulitis, and  PCCM consult for hyperkalemia and intermittent hypotension   Interim History / Subjective:  Patient alert, oriented x 4  Following commands, generalized weakness   Objective   Blood pressure 95/62, pulse 91, temperature 98.5 F (36.9 C), temperature source Oral, resp. rate 20, SpO2 100%.        Intake/Output Summary (Last 24 hours) at 01/30/2024 5621 Last data filed at 01/29/2024 2242 Gross per 24 hour  Intake 1103.68 ml  Output --  Net 1103.68 ml   There were no vitals filed for this visit.  Examination: General: acute on chronic older adult female, sitting up in ED stretcher no distress HENT:  normocephalic, poor dentition, PERRLA intact, wears glasses  Lungs: clear but diminished in lower bases, no distress  Cardiovascular: s1,s2, irregular, afib, no Twave abnormalities seen on beside cardiac monitoring, no MRG, no JVD evident at this time Abdomen: obese, soft, active Extremities: non pitting edema upper extremities, redness with blistering on right lower extremity, ace leg wrapping around left lower extremity, right lower extremity 2+ edema, left  lower extremity 1 to 2+  Neuro: alert, oriented x 4, follows commands, generalized weakness  GU: deferred   Resolved Hospital Problem list   N/a   Assessment & Plan:  Hyperkalemia  CKD Stage III hx  NAGMA  -Patient takes oral potassium daily- BID Per patient last dose taken yesterday prior to coming to hospital  P: Give Lokelma  Sodium Bicarb 2 amps  Give insulin 10 units IV  Give Dextrose 50, 1 amp  Stop IV fluids that contain potassium-LR Hold off oral potassium for now  Repeat Chemistry in 2 to 4 hours after medications give to reverse hyperkalemia  Currently no EKG changes on beside cardiac monitoring  Intermittent Hypotension? BP labile- suspect due to BP cuff and patient's body habitus  BP while performing assessment 122/70 (83)  P: Reposition cuff, check BP in both extremities, check against manual cuff MAP goal >65  No need for pressors at this time, would recommend gentle fluid admin due to  CHF hx   Current Hospital Problems managed by Primary Team  Cellulitis  CHF  UTI  DM2  GERD  Afib    Best Practice - by Primary     Labs   CBC: Recent Labs  Lab 01/23/24 0944 01/29/24 1459 01/29/24 1618 01/30/24 0337  WBC 6.3 22.5* 22.9* 24.5*  NEUTROABS  --   --  21.8*  --   HGB 10.5* 10.9* 10.3* 9.8*  HCT 31.6* 34.8* 33.7* 31.6*  MCV 90.3 94.1 94.1 94.9  PLT 193 187 199 144*    Basic Metabolic Panel: Recent Labs  Lab 01/23/24 0944 01/30/24 0450  NA 138 134*  K 4.6 >7.5*  CL 105 116*  CO2 18* 13*  GLUCOSE 227* 146*  BUN 73* 42*  CREATININE 2.07* 1.86*  CALCIUM 10.2 9.6  MG  --  1.8   GFR: Estimated Creatinine Clearance: 27.7 mL/min (A) (by C-G formula based on SCr of 1.86 mg/dL (H)). Recent Labs  Lab 01/23/24 0944 01/29/24 1459 01/29/24 1618 01/29/24 1956 01/30/24 0059 01/30/24 0337 01/30/24 0339  WBC 6.3 22.5* 22.9*  --   --  24.5*  --   LATICACIDVEN  --   --   --  2.4* 2.6*  --  2.5*    Liver Function Tests: Recent Labs   Lab 01/30/24 0450  AST 15  ALT 9  ALKPHOS 73  BILITOT 1.0  PROT 5.9*  ALBUMIN 2.6*   No results for input(s): "LIPASE", "AMYLASE" in the last 168 hours. No results for input(s): "AMMONIA" in the last 168 hours.  ABG    Component Value Date/Time   PHART 7.390 05/19/2008 1712   PCO2ART 31.4 (L) 05/19/2008 1712   PO2ART 64.0 (L) 05/19/2008 1712   HCO3 19.0 (L) 05/19/2008 1712   TCO2 20 05/19/2008 1712   ACIDBASEDEF 5.0 (H) 05/19/2008 1712   O2SAT 92.0 05/19/2008 1712     Coagulation Profile: No results for input(s): "INR", "PROTIME" in the last 168 hours.  Cardiac Enzymes: No results for input(s): "CKTOTAL", "CKMB", "CKMBINDEX", "TROPONINI" in the last 168 hours.  HbA1C: HbA1c, POC (prediabetic range)  Date/Time Value Ref Range Status  11/28/2018 08:45 AM 5.9 5.7 - 6.4 % Final   HbA1c, POC (controlled diabetic range)  Date/Time Value Ref Range Status  12/19/2023 04:53 PM 6.7 0.0 - 7.0 % Final  10/08/2023 10:58 AM 6.4 0.0 - 7.0 % Final   Hgb A1c MFr Bld  Date/Time Value Ref Range Status  01/30/2024 03:37 AM 7.8 (H) 4.8 - 5.6 % Final    Comment:    (NOTE) Pre diabetes:          5.7%-6.4%  Diabetes:              >6.4%  Glycemic control for   <7.0% adults with diabetes     CBG: Recent Labs  Lab 01/29/24 1457  GLUCAP 106*    Review of Systems:   See HPI   Past Medical History:  She,  has a past medical history of Acute cystitis with hematuria (09/01/2020), ANXIETY (01/31/2009), Arthritis, Bilateral carpal tunnel syndrome (02/13/2021), CAD (coronary artery disease) (05/2008), Cerebrovascular accident (CVA) due to embolism of right middle cerebral artery (HCC), CHF (congestive heart failure) (HCC), Chronic anticoagulation, Chronic kidney disease (CKD), stage III (moderate) (HCC), Chronic pain syndrome, Diarrhea (01/10/2021), DM type 2 with diabetic peripheral neuropathy (HCC), Dysuria (11/26/2017), Enteric duplication cyst (05/08/2021), Facial weakness,  post-stroke, Gabapentin adverse reaction (09/01/2020), Gastrointestinal hemorrhage (03/26/2017), GERD (gastroesophageal reflux disease), Hand tingling (06/02/2020), Hemiparesis (HCC), History of CVA with residual deficit (03/26/2017), Hyperlipidemia, Hypertension, Hypokalemia (11/20/2017), Intertrigo (08/05/2018), Ischemic cardiomyopathy, Longstanding persistent atrial fibrillation (HCC), Macular degeneration, Melena (01/10/2021), Morbid obesity (HCC) (03/26/2017), OSA on CPAP, Proliferative diabetic retinopathy of right eye (HCC) (04/17/2021), Rectal bleeding, and Thrombocytopenia (HCC).   Surgical History:   Past Surgical History:  Procedure Laterality Date   APPENDECTOMY  1946   BACK SURGERY     CARPAL TUNNEL RELEASE Left 03/28/2021   Procedure: LEFT CARPAL TUNNEL RELEASE;  Surgeon: Cindee Salt, MD;  Location: [facility] OR;  Service: Orthopedics;  Laterality: Left;  60 MIN   CARPAL TUNNEL RELEASE Right 09/15/2021   Procedure: RIGHT CARPAL TUNNEL RELEASE;  Surgeon: Cindee Salt, MD;  Location: [facility] OR;  Service: Orthopedics;  Laterality: Right;   CATARACT EXTRACTION Right 2012   COLONOSCOPY WITH PROPOFOL Left 03/27/2017   Procedure: COLONOSCOPY WITH PROPOFOL;  Surgeon: Kerin Salen, MD;  Location: [facility] ENDOSCOPY;  Service: Gastroenterology;  Laterality: Left;   CORONARY ANGIOPLASTY WITH STENT PLACEMENT  2009   "put 2 stents in"   EXCISION METACARPAL MASS Left 03/28/2021   Procedure: EXCISION MASS LEFT THUMB;  Surgeon: Cindee Salt, MD;  Location: [facility] OR;  Service: Orthopedics;  Laterality: Left;   EYE SURGERY     bilateral cataract   HERNIA REPAIR     IR LUMBAR DISC ASPIRATION W/IMG GUIDE  10/08/2018   JOINT REPLACEMENT     TEE WITH CARDIOVERSION     Postoperative in 2009;  S/P transesophageal echocardiography-guided cardioversion,   TOTAL KNEE ARTHROPLASTY Right 1994   VENTRAL HERNIA REPAIR  10/1999   With mesh     Social History:   reports that she has never smoked. She has never been exposed to  tobacco smoke. She has never used smokeless tobacco. She reports that she does not drink alcohol and does not use drugs.   Family History:  Her family history includes Clotting disorder in her mother; Emphysema in her father; Lung cancer in her brother; Other in her mother. There is no history of Heart disease.   Allergies Allergies  Allergen Reactions   Benazepril Other (  See Comments)    Unknown reaction at age 64-65 - possibly dizziness   Cymbalta [Duloxetine Hcl]     Itch, Dry mouth    Ozempic (0.25 Or 0.5 Mg-Dose) [Semaglutide(0.25 Or 0.5mg -Dos)] Diarrhea    Gi intollerance   Tape     TOLERATES PAPER TAPE ONLY- due to thin skin. NOT ALLERGY PER PT   Angiotensin Receptor Blockers Other (See Comments)    Hypotension reaction   Bactrim [Sulfamethoxazole-Trimethoprim] Other (See Comments)    Unknown reaction   Fe-Succ-C-Thre-B12-Des Stomach Other (See Comments)    Unknown reaction  Ferocon?     Home Medications  Prior to Admission medications   Medication Sig Start Date End Date Taking? Authorizing Provider  acetaminophen (TYLENOL 8 HOUR) 650 MG CR tablet Take 1 tablet (650 mg total) by mouth every 8 (eight) hours as needed for pain. 11/03/19   Garnette Gunner, MD  apixaban (ELIQUIS) 2.5 MG TABS tablet Take 1 tablet (2.5 mg total) by mouth 2 (two) times daily. 12/06/23   Evette Georges, MD  atorvastatin (LIPITOR) 40 MG tablet Take 1 tablet (40 mg total) by mouth daily. 01/21/24   Laurey Morale, MD  Blood Glucose Monitoring Suppl (ONE TOUCH ULTRA 2) w/Device KIT USE TO CHECK BLOOD SUGAR UPTO 3 TIMES A DAY 12/06/23   Evette Georges, MD  carvedilol (COREG) 3.125 MG tablet Take 1 tablet (3.125 mg total) by mouth 2 (two) times daily with a meal. 01/23/24   Alen Bleacher, NP  Cholecalciferol 25 MCG (1000 UT) tablet Take 1,000 Units by mouth daily.    [provider]  clotrimazole (LOTRIMIN AF) 1 % cream Apply 1 Application topically 2 (two) times daily. 01/06/24   Elberta Fortis,  MD  diclofenac Sodium (VOLTAREN) 1 % GEL Apply 2 g topically as needed. 12/06/23   Evette Georges, MD  docusate sodium (COLACE) 100 MG capsule Take 100 mg by mouth at bedtime.    [provider]  doxycycline (VIBRA-TABS) 100 MG tablet Take 1 tablet (100 mg total) by mouth 2 (two) times daily for 7 days. 01/29/24 02/05/24  Evette Georges, MD  Dulaglutide (TRULICITY) 1.5 MG/0.5ML SOAJ Inject 1.5 mg into the skin once a week. 01/29/24   Evette Georges, MD  ferrous sulfate 325 (65 FE) MG tablet TAKE 1 TABLET BY MOUTH EVERY DAY WITH BREAKFAST 09/16/20   Maury Dus, MD  glucose blood (ONE TOUCH ULTRA TEST) test strip Use three times a day 09/15/20   Maury Dus, MD  glucose blood test strip Use as instructed to check blood glucose up to 4x daily 10/10/22   Maury Dus, MD  Lancets [facility] DELICA PLUS [place]) MISC Please use to check blood sugar up to four times daily. 10/10/22   Maury Dus, MD  lidocaine (LIDODERM) 5 % Place 1 patch onto the skin daily. Remove & Discard patch within 12 hours or as directed by MD 12/23/23   Evette Georges, MD  megestrol (MEGACE) 40 MG tablet Take 1 tablet (40 mg total) by mouth daily. 01/29/24   Evette Georges, MD  metolazone (ZAROXOLYN) 2.5 MG tablet Take only as directed by [facility] Failure Clinic 01/23/24   Alen Bleacher, NP  Multiple Vitamins-Minerals (PRESERVISION AREDS 2+MULTI VIT PO) Take 1 capsule by mouth in the morning and at bedtime.    [provider]  nitroGLYCERIN (NITROSTAT) 0.4 MG SL tablet Place 1 tablet (0.4 mg total) under the tongue every 5 (five) minutes as needed for chest pain (up to 3 doses). 12/19/23  Tiffany Kocher, DO  nystatin (MYCOSTATIN/NYSTOP) powder Apply 1 Application topically 3 (three) times daily. Until resolution. 01/29/24   Evette Georges, MD  potassium chloride SA (KLOR-CON M) 20 MEQ tablet Take 3 tablets (60 mEq total) by mouth 2 (two) times daily. 01/07/24   [place], Anderson Malta, FNP  rOPINIRole (REQUIP) 0.5 MG tablet  Take 2 tablets (1 mg total) by mouth at bedtime. 12/06/23   Evette Georges, MD  spironolactone (ALDACTONE) 25 MG tablet Take 0.5 tablets (12.5 mg total) by mouth daily. 12/06/23   Evette Georges, MD  torsemide (DEMADEX) 100 MG tablet Take 1 tablet (100 mg total) by mouth 2 (two) times daily. 01/07/24   [place], Anderson Malta, FNP  traMADol (ULTRAM) 50 MG tablet Take 1 tablet (50 mg total) by mouth every 12 (twelve) hours as needed for severe pain (pain score 7-10) (Can take every 6 hours for breakthrough pain.). 01/13/24   Evette Georges, MD  vitamin B-12 (CYANOCOBALAMIN) 1000 MCG tablet Take 1 tablet (1,000 mcg total) by mouth daily. 09/16/20   Maury Dus, MD  zinc oxide 20 % ointment Apply 1 application topically as needed (buttocks irritation). 10/12/21   Maury Dus, MD     35 mins     Hazel Sams AGACNP-BC   [facility] 01/30/2024, 6:39 AM  Please see [email or web address] for pager details.  From 7A-7P if no response, please call [phone]. After hours, please call ELink [phone].

## 2024-01-30 NOTE — ED Notes (Signed)
CBG 201

## 2024-01-30 NOTE — Assessment & Plan Note (Signed)
 Rapidly progressed since being seen in clinic.  Monitor for hypotension. -Vancomycin per pharmacy, CTX as below -Wound care consult placed -Trend lactate to normal -Tylenol 650 mg Q6h PRN -Follow BCx -AM CBC, BMP

## 2024-01-30 NOTE — ED Notes (Addendum)
 Contacted Vonna Drafts, MD and Carlene Coria, DO regarding patient's decreased blood pressure.

## 2024-01-30 NOTE — Progress Notes (Signed)
 PT Cancellation Note  Patient Details Name: MICALAH CABEZAS MRN: [id number] DOB: 1941/10/20   Cancelled Treatment:    Reason Eval/Treat Not Completed: Medical issues which prohibited therapy (Pt continues with K+ at 7.2. Will continue to hold for now and attempt as able and appropriate.)  Harrel Carina, DPT, CLT  Acute Rehabilitation Services Office: [phone] (Secure chat preferred)   Claudia Desanctis 01/30/2024, 3:27 PM

## 2024-01-30 NOTE — Assessment & Plan Note (Signed)
 Remains A&O x4.  Outpatient AMS workup and CT unremarkable.  Suspect hypotension/weakness in setting of cellulitis. -Continue to monitor for confusion -Delirium precautions -Fall precautions -PT/OT

## 2024-01-30 NOTE — Plan of Care (Signed)
 FMTS Brief Progress Note  S: [name] is a 83 y.o. female with a pertinent history of CAD, CVA on Eliquis, HFpEF, CKD, T2DM, GERD, HLD, HTN, Afib on Eliquis, and OSA presenting with generalized weakness found to have RLE cellulitis and hyperkalemia.  Patient notes some pain in RLE, but is well controlled.  O: BP (!) 134/36   Pulse 99   Temp 98.8 F (37.1 C) (Oral)   Resp (!) 23   LMP  (LMP Unknown)   SpO2 100%   General: Elderly female, resting comfortably in bed, NAD, alert and at baseline. Alert and oriented x4. Cardiovascular: Irregular rate and rhythm. Normal S1/S2. No murmurs, rubs, or gallops appreciated. 2+ radial pulses. Pulmonary: Clear bilaterally to ascultation. No increased WOB, no accessory muscle usage on room air. No wheezes, crackles, or rhonchi. Abdominal: No tenderness to deep or light palpation. No rebound or guarding. No HSM. Skin: R lower leg cellulitis and wound with erythema unchanged borders from previous mark.  No drainage.  Moderate edema, calor. Extremities: No peripheral edema bilaterally. Capillary refill <2 seconds.   A/P: Assessment & Plan Cellulitis of right leg Rapidly progressed since being seen in clinic.  Monitor for hypotension. -Vancomycin per pharmacy, CTX as below -Wound care consult placed -Trend lactate to normal -Tylenol 650 mg Q6h PRN -Follow BCx -AM CBC, BMP Hyperkalemia K recheck pending, will reorder therapy as needed based on results. -PM BMP 1600 -Repeat EKG -Appreciate CCM consult -Lokelma -Sodium bicarb 2 amps -Insulin 10u IV, D50 x1 amp -Stop LR, no oral KCl -Repeat K 2-4 hours Urinary tract infection Urinary frequency and urgency x 2 days.  Urinalysis in the ED showing UTI.  Received 1 dose of ceftriaxone in the ED. -PureWick ordered -Ceftriaxone 1g q24h -Urine culture pending -Strict I&Os Generalized weakness Remains A&O x4.  Outpatient AMS workup and CT unremarkable.  Suspect hypotension/weakness in setting  of cellulitis. -Continue to monitor for confusion -Delirium precautions -Fall precautions -PT/OT CHF (congestive heart failure) (HCC) Echo 01/14/2023 with LVEF 50%, LV mildly decreased function and regional WMA, RV systolic function mildly reduced, moderately elevated pulmonary artery systolic pressure.  Sees HF outpatient. -Holding home spironolactone, carvedilol, torsemide in setting of hypotension -Strict I/Os, daily weights -Caution with fluid resuscitation, s/p 2L in ED -Follow up on metolazone 2.5 mg per HF -HF consulted, follow up recommendations Type 2 diabetes mellitus (HCC) On trulicity at home. A1c 7.8 this admission. -Very sensitive sliding scale given CKD IV -CBGs ACHS -Insulin + dextrose in setting of hyperkalemia as above Chronic health problem CAD - s/p CABG in 2009 with 2 stents, continue atorvastatin 40 mg daily CKD IV - Monitor Cr Hypertension - See CHF above A-fib - Continue Eliquis 2.5 mg BID (renally dosed), cardiac telemetry Restless leg syndrome - Continue Requip 1 mg at bedtime  Intertrigo - Continue nystatin powder to gluteal folds Hx CVA - Continue Eliquis OSA - Does not use CPAP  -Remainder of plan and note per H&P, see earlier today  [name], [name], MD 01/30/2024, 11:28 AM PGY-1, [facility] Night Resident  Please page [phone] with questions

## 2024-01-30 NOTE — Assessment & Plan Note (Signed)
 On trulicity at home. A1c 7.8 this admission. -Very sensitive sliding scale given CKD IV -CBGs ACHS -Insulin + dextrose in setting of hyperkalemia as above

## 2024-01-30 NOTE — Plan of Care (Signed)
 FMTS Interim Progress Note  Spoke with Dr. Sherryll Burger with PCCM regarding pts critical hyperkalemia and recent soft BP. Their team will evaluate the patient.   Vonna Drafts, MD 01/30/2024, 5:46 AM PGY-2, [facility] Service pager [phone]

## 2024-01-30 NOTE — Assessment & Plan Note (Addendum)
 K recheck pending, will reorder therapy as needed based on results. -PM BMP 1600 -Repeat EKG -Appreciate CCM consult -Lokelma -Sodium bicarb 2 amps -Insulin 10u IV, D50 x1 amp -Stop LR, no oral KCl -Repeat K 2-4 hours

## 2024-01-30 NOTE — ED Notes (Signed)
Cbg 201 

## 2024-01-30 NOTE — Plan of Care (Signed)
 Went to evaluate patient at bedside.  Son Soliyana Mcchristian was present.  I updated Molly Maduro and patient on current care plan, including treatment for cellulitis/UTI and treatment of her hyperkalemia.  Patient and son expressed understanding and agreement with plan.  We then opened discussion to CODE STATUS.  I discussed the purpose of CODE STATUS.  I provided my recommendation against full code given patient's chronic comorbidities and advanced age.  I open the floor for patient and son to express their goals and opinions surrounding CODE STATUS.  Both patient and son agreed that they would not want full code, however they would desire ICU level care/IV pressors/intubation for prearrest interventions.  Son is aware of mothers wishes related to prolonged life support, and she also has a living will that would help guide discontinuation of ventilator and pressor support if prognosis is poor.  I stated that this would make patient DNR-intervene, and patient/son agree.

## 2024-01-30 NOTE — ED Notes (Signed)
 Discussed patient's low blood pressure with Carlene Coria, DO. She stated to continue to monitor patient's blood pressure and monitor MAP that it stays above 60.

## 2024-01-30 NOTE — Telephone Encounter (Cosign Needed)
 Discussed recent outpatient laboratory results with patient while in the hospital being treated for cellulitis.  CT head also unremarkable.  Right lower extremity with marked worsening of cellulitis since office visit yesterday.  She is being admitted for antibiotics in the setting of this and evidence of UTI.  Suspect confusion related to current infection.  Advised I will let the physician caring for her on the inpatient service know that she is in need of home health services at discharge since she has not been able to get connected in the outpatient setting so far after her insurance changed.

## 2024-01-30 NOTE — Plan of Care (Signed)
 Notified by nursing @ 1207 that K+ was 7.2. EKG unchanged.   P: -10 mg Lokelma -10 U Novolog with D50 -q4h K checks

## 2024-01-30 NOTE — Assessment & Plan Note (Addendum)
 Echo 01/14/2023 with LVEF 50%, LV mildly decreased function and regional WMA, RV systolic function mildly reduced, moderately elevated pulmonary artery systolic pressure. Follows with heart failure team, last seen on 3/13. Takes Metolazone as directed by HF team. BNP slightly elevated at 145.4, do not suspect acute exacerbation at this time although pt feels like her leg swelling is worse.  - Consider consult to heart failure team in AM - Holding home spironolactone, coreg, torsemide in setting of hypotension - Consider repeat echo - Strict Is/O, daily weights - Caution with fluid resuscitation

## 2024-01-30 NOTE — Progress Notes (Signed)
 Pharmacy Antibiotic Note  [name] is a 83 y.o. female admitted on 01/29/2024 with cellulitis.  Pharmacy has been consulted for vancomycin dosing.  Plan: Vancomycin 2g load > vancomycin 1g q48h (eAUC 474, Scr 2.07, Vd 0.5) F/u renal function, infectious work up and length of therapy Vancomycin levels as needed     Temp (24hrs), Avg:98.5 F (36.9 C), Min:98.2 F (36.8 C), Max:98.9 F (37.2 C)  Recent Labs  Lab 01/23/24 0944 01/29/24 1459 01/29/24 1618 01/29/24 1956  WBC 6.3 22.5* 22.9*  --   CREATININE 2.07*  --   --   --   LATICACIDVEN  --   --   --  2.4*    Estimated Creatinine Clearance: 24.9 mL/min (A) (by C-G formula based on SCr of 2.07 mg/dL (H)).    Allergies  Allergen Reactions   Benazepril Other (See Comments)    Unknown reaction at age 56-65 - possibly dizziness   Cymbalta [Duloxetine Hcl]     Itch, Dry mouth    Ozempic (0.25 Or 0.5 Mg-Dose) [Semaglutide(0.25 Or 0.5mg -Dos)] Diarrhea    Gi intollerance   Tape     TOLERATES PAPER TAPE ONLY- due to thin skin. NOT ALLERGY PER PT   Angiotensin Receptor Blockers Other (See Comments)    Hypotension reaction   Bactrim [Sulfamethoxazole-Trimethoprim] Other (See Comments)    Unknown reaction   Fe-Succ-C-Thre-B12-Des Stomach Other (See Comments)    Unknown reaction  Ferocon?    Antimicrobials this admission: Ceftriaxone 3/19 > Vancomycin 3/20 >  Thank you for allowing pharmacy to be a part of this patient's care.  Marja Kays 01/30/2024 12:31 AM

## 2024-01-30 NOTE — Assessment & Plan Note (Addendum)
 Suspect weakness exacerbated by current infection however patient states that this along with confusion has been going on for several months.  Given duration of symptoms do not suspect acute neurological event such as stroke, CT head unremarkable which is reassuring. - Continue to monitor for confusion - Delirium precautions - PT/OT

## 2024-01-30 NOTE — Assessment & Plan Note (Addendum)
 Urinary frequency and urgency x 2 days.  Urinalysis in the ED showing UTI.  Received 1 dose of ceftriaxone in the ED. - Ceftriaxone 1g q24h - Urine culture pending - Strict I/O

## 2024-01-30 NOTE — Plan of Care (Addendum)
 Notified by nursing @ ~1422 that LA was 3.2. Has received >2L of IVF, with known CHF.   Went to evaluate patient at bedside. Patient was asleep, but arousable. Pain is primarily in legs and shoulders. Denies chest or abdomen pain. States she is having some dyspnea.   O: Cardiac: Irregular rhythm Resp: Normal WOB on RA, CTAB anteriorly GI: Soft, not tender, not distended   P: Lactic acidosis -Repeat CXR for volume status in setting of dyspnea -Obtain Echo to assess ventricular function -VBG -500 ml NS bolus  Hyperkalemia -6.1 after 10 mg Lokelma and 10 U Novolog -Redose 10 U Novolog now -100 meq sodium bicarb given elevated K and increased LA  If LA and K remain elevated with low BP, will reach back out to CCM.

## 2024-01-30 NOTE — Assessment & Plan Note (Signed)
 CAD - s/p CABG in 2009 with 2 stents, continue atorvastatin 40 mg daily CKD IV - Monitor Cr Hypertension - See CHF above A-fib - Continue Eliquis 2.5 mg BID (renally dosed), cardiac telemetry Restless leg syndrome - Continue Requip 1 mg at bedtime  Intertrigo - Continue nystatin powder to gluteal folds Hx CVA - Continue Eliquis OSA - Does not use CPAP

## 2024-01-31 ENCOUNTER — Inpatient Hospital Stay (HOSPITAL_COMMUNITY)

## 2024-01-31 ENCOUNTER — Telehealth: Payer: Self-pay

## 2024-01-31 DIAGNOSIS — L03115 Cellulitis of right lower limb: Secondary | ICD-10-CM | POA: Diagnosis not present

## 2024-01-31 DIAGNOSIS — I5021 Acute systolic (congestive) heart failure: Secondary | ICD-10-CM

## 2024-01-31 DIAGNOSIS — I482 Chronic atrial fibrillation, unspecified: Secondary | ICD-10-CM | POA: Diagnosis not present

## 2024-01-31 DIAGNOSIS — I5022 Chronic systolic (congestive) heart failure: Secondary | ICD-10-CM | POA: Diagnosis not present

## 2024-01-31 LAB — GLUCOSE, CAPILLARY
Glucose-Capillary: 149 mg/dL — ABNORMAL HIGH (ref 70–99)
Glucose-Capillary: 215 mg/dL — ABNORMAL HIGH (ref 70–99)
Glucose-Capillary: 214 mg/dL — ABNORMAL HIGH (ref 70–99)
Glucose-Capillary: 260 mg/dL — ABNORMAL HIGH (ref 70–99)

## 2024-01-31 LAB — BASIC METABOLIC PANEL
Anion gap: 7 (ref 5–15)
Anion gap: 6 (ref 5–15)
BUN: 41 mg/dL — ABNORMAL HIGH (ref 8–23)
BUN: 40 mg/dL — ABNORMAL HIGH (ref 8–23)
CO2: 19 mmol/L — ABNORMAL LOW (ref 22–32)
CO2: 17 mmol/L — ABNORMAL LOW (ref 22–32)
Calcium: 10 mg/dL (ref 8.9–10.3)
Calcium: 9.7 mg/dL (ref 8.9–10.3)
Chloride: 112 mmol/L — ABNORMAL HIGH (ref 98–111)
Chloride: 114 mmol/L — ABNORMAL HIGH (ref 98–111)
Creatinine, Ser: 1.86 mg/dL — ABNORMAL HIGH (ref 0.44–1.00)
Creatinine, Ser: 1.8 mg/dL — ABNORMAL HIGH (ref 0.44–1.00)
GFR, Estimated: 26 mL/min — ABNORMAL LOW (ref >60)
GFR, Estimated: 27 mL/min — ABNORMAL LOW (ref >60)
Glucose, Bld: 184 mg/dL — ABNORMAL HIGH (ref 70–99)
Glucose, Bld: 163 mg/dL — ABNORMAL HIGH (ref 70–99)
Potassium: 5.4 mmol/L — ABNORMAL HIGH (ref 3.5–5.1)
Potassium: 4.8 mmol/L (ref 3.5–5.1)
Sodium: 138 mmol/L (ref 135–145)
Sodium: 137 mmol/L (ref 135–145)

## 2024-01-31 LAB — CBC
HCT: 28.5 % — ABNORMAL LOW (ref 36.0–46.0)
Hemoglobin: 9.1 g/dL — ABNORMAL LOW (ref 12.0–15.0)
MCH: 29.4 pg (ref 26.0–34.0)
MCHC: 31.9 g/dL (ref 30.0–36.0)
MCV: 91.9 fL (ref 80.0–100.0)
Platelets: 147 10*3/uL — ABNORMAL LOW (ref 150–400)
RBC: 3.1 MIL/uL — ABNORMAL LOW (ref 3.87–5.11)
RDW: 18 % — ABNORMAL HIGH (ref 11.5–15.5)
WBC: 18 10*3/uL — ABNORMAL HIGH (ref 4.0–10.5)
nRBC: 0 % (ref 0.0–0.2)

## 2024-01-31 LAB — MAGNESIUM: Magnesium: 1.8 mg/dL (ref 1.7–2.4)

## 2024-01-31 LAB — ECHOCARDIOGRAM COMPLETE
Height: 68 in
S' Lateral: 4.7 cm
Weight: 3671.98 [oz_av]

## 2024-01-31 LAB — LACTIC ACID, PLASMA: Lactic Acid, Venous: 2.9 mmol/L (ref 0.5–1.9)

## 2024-01-31 MED ORDER — FUROSEMIDE 10 MG/ML IJ SOLN
80.0000 mg | Freq: Once | INTRAMUSCULAR | Status: AC
  Administered 2024-01-31: 80 mg via INTRAVENOUS
  Filled 2024-01-31: qty 8

## 2024-01-31 MED ORDER — PERFLUTREN LIPID MICROSPHERE
1.0000 mL | INTRAVENOUS | Status: AC | PRN
  Administered 2024-01-31: 2 mL via INTRAVENOUS

## 2024-01-31 MED ORDER — SODIUM ZIRCONIUM CYCLOSILICATE 10 G PO PACK
10.0000 g | PACK | Freq: Once | ORAL | Status: AC
  Administered 2024-01-31: 10 g via ORAL
  Filled 2024-01-31: qty 1

## 2024-01-31 NOTE — Evaluation (Signed)
 Occupational Therapy Evaluation Patient Details Name: [name] MRN: [id number] DOB: March 03, 1941 Today's Date: 01/31/2024   History of Present Illness   Pt is 83 yo presenting to [facility] ED on 3/19 due to generalized weakness and BLE swelling. Pt with cellulitis of R LE. PMH: CHF, CKD III, DM II, diabetic neuropathy, GERD, CVA 2018, Afib on eliquis, OSA and thrombocytopenia.     Clinical Impressions Pt lives with her supportive son. She typically walks with a rollator, standing from elevated surfaces, modified independently. She can self feed, perform some grooming tasks and toilet mod I. She is dependent in bathing, dressing and all IADLs. Pt presents with fear of falling, generalized weakness and impaired standing balance. Pt currently requires +2 for all mobility. She was able to stand with +2 max assist and increased time and step along EOB with +2 min assist. Pt typically sleeps in her lift chair. Pt needs to be able to take herself to the bathroom to return home as her son works. Patient will benefit from continued inpatient follow up therapy, <3 hours/day.     If plan is discharge home, recommend the following:   Two people to help with walking and/or transfers;Two people to help with bathing/dressing/bathroom;Assistance with cooking/housework;Assistance with feeding;Direct supervision/assist for medications management;Direct supervision/assist for financial management;Assist for transportation;Help with stairs or ramp for entrance     Functional Status Assessment   Patient has had a recent decline in their functional status and/or demonstrates limited ability to make significant improvements in function in a reasonable and predictable amount of time     Equipment Recommendations   Other (comment) (defer)     Recommendations for Other Services         Precautions/Restrictions   Precautions Precautions: Fall Restrictions Weight Bearing Restrictions Per Provider  Order: No     Mobility Bed Mobility Overal bed mobility: Needs Assistance Bed Mobility: Supine to Sit, Sit to Supine, Rolling Rolling: Total assist   Supine to sit: +2 for physical assistance, Total assist Sit to supine: +2 for physical assistance, Total assist   General bed mobility comments: pt typically sleeps in her lift chair, requires assist for all aspects of bed mobility    Transfers Overall transfer level: Needs assistance Equipment used: Rolling walker (2 wheels) Transfers: Sit to/from Stand Sit to Stand: +2 physical assistance, Max assist, From elevated surface           General transfer comment: assist from elevated bed, simulating height of lift chair at home, with gait belt and bed pad under hips, increased time to adjust feet under hips, flexed posture      Balance Overall balance assessment: Needs assistance Sitting-balance support: Feet supported, Bilateral upper extremity supported Sitting balance-Leahy Scale: Fair Sitting balance - Comments: close guard   Standing balance support: Bilateral upper extremity supported Standing balance-Leahy Scale: Poor                             ADL either performed or assessed with clinical judgement   ADL Overall ADL's : Needs assistance/impaired Eating/Feeding: Set up;Bed level   Grooming: Set up;Bed level                       Toileting- Clothing Manipulation and Hygiene: Total assistance;+2 for physical assistance;Sit to/from stand         General ADL Comments: dependent at baseline in bathing and dressing     Vision Baseline  Vision/History: 1 Wears glasses Ability to See in Adequate Light: 0 Adequate Patient Visual Report: No change from baseline       Perception         Praxis         Pertinent Vitals/Pain Pain Assessment Pain Assessment: Faces Faces Pain Scale: Hurts little more Pain Location: LEs Pain Descriptors / Indicators: Grimacing, Guarding, Discomfort Pain  Intervention(s): Monitored during session, Repositioned     Extremity/Trunk Assessment Upper Extremity Assessment Upper Extremity Assessment: Right hand dominant;RUE deficits/detail;LUE deficits/detail RUE Deficits / Details: significant, long standing shoulder limitations due to rotator cuff tear, full AROM elbow to hand with generalized weakness RUE Coordination: decreased gross motor LUE Deficits / Details: same as R LUE Coordination: decreased gross motor   Lower Extremity Assessment Lower Extremity Assessment: Defer to PT evaluation   Cervical / Trunk Assessment Cervical / Trunk Assessment: Other exceptions;Kyphotic Cervical / Trunk Exceptions: weakness, increased body habitus   Communication Communication Communication: No apparent difficulties   Cognition Arousal: Alert Behavior During Therapy: Anxious Cognition: Cognition impaired   Orientation impairments: Situation Awareness: Intellectual awareness intact, Online awareness impaired Memory impairment (select all impairments): Short-term memory, Working Biochemist, clinical functioning impairment (select all impairments): Initiation                   Following commands: Impaired Following commands impaired: Follows one step commands with increased time     Cueing  General Comments   Cueing Techniques: Verbal cues;Gestural cues      Exercises     Shoulder Instructions      Home Living Family/patient expects to be discharged to:: Private residence Living Arrangements: Children (son) Available Help at Discharge: Family;Available PRN/intermittently Type of Home: Mobile home Home Access: Stairs to enter Entrance Stairs-Number of Steps: 2 Entrance Stairs-Rails: Right Home Layout: One level     Bathroom Shower/Tub: Producer, television/film/video: Handicapped height     Home Equipment: Grab bars - tub/shower;Grab bars - toilet;Hand held Stage manager (4 wheels);BSC/3in1;Lift chair    Additional Comments: adjustable bed, had a hand rail      Prior Functioning/Environment Prior Level of Function : Needs assist       Physical Assist : (P) ADLs (physical)   ADLs (physical): (P) Bathing Mobility Comments: used upright rollator for mobility ADLs Comments: CNA comes for 1 hour x3 a week. Help for bathing/dressing and son helps with cooking and housekeeping, Pt can self feed, groom and toilet independently.    OT Problem List: Decreased strength;Decreased range of motion;Decreased activity tolerance;Impaired balance (sitting and/or standing);Decreased coordination;Decreased cognition;Decreased safety awareness;Obesity;Impaired UE functional use;Pain;Increased edema   OT Treatment/Interventions: Self-care/ADL training;DME and/or AE instruction;Therapeutic activities;Cognitive remediation/compensation;Patient/family education;Balance training      OT Goals(Current goals can be found in the care plan section)   Acute Rehab OT Goals OT Goal Formulation: With patient/family Time For Goal Achievement: 02/14/24 Potential to Achieve Goals: Good ADL Goals Pt Will Perform Grooming: with mod assist;standing Pt Will Transfer to Toilet: ambulating;bedside commode;with min assist Pt Will Perform Toileting - Clothing Manipulation and hygiene: with mod assist;sit to/from stand Additional ADL Goal #1: Pt will perform sit to stand from elevated surface with min assist in preparation for toileting and standing grooming.   OT Frequency:  Min 2X/week    Co-evaluation PT/OT/SLP Co-Evaluation/Treatment: Yes Reason for Co-Treatment: For patient/therapist safety   OT goals addressed during session: ADL's and self-care;Strengthening/ROM      AM-PAC OT "6 Clicks" Daily Activity  Outcome Measure Help from another person eating meals?: A Little Help from another person taking care of personal grooming?: A Lot Help from another person toileting, which includes using toliet, bedpan,  or urinal?: Total Help from another person bathing (including washing, rinsing, drying)?: Total Help from another person to put on and taking off regular upper body clothing?: A Lot Help from another person to put on and taking off regular lower body clothing?: Total 6 Click Score: 10   End of Session Equipment Utilized During Treatment: Gait belt;Rolling walker (2 wheels) Nurse Communication: Mobility status;Other (comment) (O2 100% on RA)  Activity Tolerance: Patient tolerated treatment well Patient left: in bed;with call bell/phone within reach;with bed alarm set;with family/visitor present  OT Visit Diagnosis: Unsteadiness on feet (R26.81);Pain;Muscle weakness (generalized) (M62.81);Other symptoms and signs involving cognitive function                Time: 0102-7253 OT Time Calculation (min): 36 min Charges:  OT General Charges $OT Visit: 1 Visit OT Evaluation $OT Eval Moderate Complexity: 1 Mod  Berna Spare, OTR/L Acute Rehabilitation Services Office: [phone]   Evern Bio 01/31/2024, 1:00 PM

## 2024-01-31 NOTE — Progress Notes (Signed)
 Patient Name: [name] Date of Encounter: 01/31/2024 [facility] Cardiologist: Armanda Magic, MD   Interval Summary  .    Reports she had a rough night last night, she was wondering where her son went and had some confusion although seems to be normal mentation today.  Reports some mild shortness of breath/tachypnea today.  No chest pain, has some swelling. Vital Signs .    Vitals:   01/30/24 2100 01/31/24 0010 01/31/24 0329 01/31/24 0835  BP: (!) 134/53 (!) 142/72 136/64 (!) 132/52  Pulse: 82 89 95 85  Resp: 17 17 20 19   Temp:  97.8 F (36.6 C) 98.1 F (36.7 C)   TempSrc:  Oral Axillary   SpO2:  99% 100% 100%  Weight:   106.6 kg   Height:        Intake/Output Summary (Last 24 hours) at 01/31/2024 0912 Last data filed at 01/30/2024 1808 Gross per 24 hour  Intake 0 ml  Output --  Net 0 ml      01/31/2024    3:29 AM 01/30/2024    2:10 PM 01/29/2024    8:42 AM  Last 3 Weights  Weight (lbs) 235 lb 0.2 oz 217 lb 12.8 oz 217 lb 12.8 oz  Weight (kg) 106.6 kg 98.793 kg 98.793 kg      Telemetry/ECG    Atrial fibrillation heart rates in the 80s- Personally Reviewed  CV Studies    Echocardiogram this admission is pending  Physical Exam .   GEN: No acute distress.   Neck: + JVD, V waves Cardiac: Irregularly irregular no murmurs Respiratory: Clear to auscultation bilaterally. GI: Soft, nontender, non-distended  MS:  Bilaterally wrapped legs, cellulitis, suspect edema although would not let me touch due to pain  Patient Profile    [name] is a 83 y.o. female has hx of CAD status post CABG 2009, ischemic cardiomyopathy with recovered EF, persistent atrial fibrillation, CVA May 2017, CKD, diabetes, GI bleed 2018, OSA.  Currently patient admitted for hyperkalemia and sepsis secondary to cellulitis.  Cardiology asked to see due to CHF history  Assessment & Plan .     Ischemic cardiomyopathy with recovered EF March 2024 echo 50% with mildly reduced  RV function, RVSP 47.3.  She has been getting IV fluids.  She has questionable weight increase of almost 17 pound since yesterday but regardless still looks volume up and mildly tachypneic.  Will ask nurse to reweigh patient. Will give 1 dose of IV Lasix 80 mg this should also help with her hyperkalemia. No evidence of amyloid with negative PYP, multiple myeloma panel. PTA GDMT: Spironolactone 12.5 mg, torsemide 100 mg daily, metolazone as needed, carvedilol 3.125 mg.  Not on Jardiance due to urinary incontinence and infection risk. Inpatient GDMT: Continue to hold spironolactone and torsemide given underlying hyperkalemia.  She has had soft pressures so Coreg being held. Echocardiogram pending Follows with advanced heart failure  Hyperkalemia In the setting of spironolactone, torsemide, potassium supplementation 60 mEq twice daily.  Potassium greater than 7.5 on admission now normalized 4.8 today.  At discharge may need to stop all together given this issue and renal function. EKG without any acute changes.  Sepsis secondary to cellulitis Antibiotics per primary team.  Lactic is still elevated.  Be judicious about IV fluid for now that she is volume up and normalized blood pressure   CAD status post CABG 2009 No anginal complaints, seems to be stable. New atorvastatin 40 mg  Permanent atrial  fibrillation I think permanent seems more appropriate diagnosis as there are no attempts to get her back into NSR she is rate controlled with heart rates in the 80s generally. Rates are fine, when she stabilizes more can add back her carvedilol 3.25 mg twice daily Continue Eliquis reduced dose 2.5 mg twice daily for age and renal function  OSA not on CPAP Intolerant  Type 2 diabetes A1c 7.8%.    For questions or updates, please contact  [facility] Please consult [email or web address] for contact info under        Signed, Abagail Kitchens, PA-C

## 2024-01-31 NOTE — Assessment & Plan Note (Addendum)
 Urinary frequency and urgency x 2 days. -PureWick ordered -Ceftriaxone 1g Q24h -Urine culture growing E. coli, follow up sensitivities -Strict I&Os

## 2024-01-31 NOTE — Progress Notes (Addendum)
 Physical Therapy Evaluation Patient Details Name: [name] MRN: [id number] DOB: 1941-09-03 Today's Date: 01/31/2024  History of Present Illness  Pt is 83 yo presenting to [facility] ED on 3/19 due to generalized weakness and BLE swelling. Pt with sepsis 2/2 cellulitis. PMH: CHF, CKD 4, DM II, diabetic neuropathy, GERD, CVA 2018, Afib on eliquis, OSA and thrombocytopenia, macular degeneration, CAD, venous stasis   Clinical Impression  Pt in bed upon arrival with son present and agreeable to PT eval. PTA, pt would sleep in a lift recliner and was able to stand and ambulate short distances with an upright rollator. In today's session, pt required TotalAx2 for bed mobility using the bed pad. She was able to stand with MaxAx2 with the RW and use of the bed pad. Once in standing, pt was able to take side steps towards the [facility] with MinAx2. Pt currently with functional limitations due to the deficits listed below (see PT Problem List). Pt would benefit from acute skilled PT to address functional impairments. Recommending post-acute rehab <3 hrs to work towards independence with mobility. Acute PT to follow.         If plan is discharge home, recommend the following: A lot of help with walking and/or transfers;A lot of help with bathing/dressing/bathroom;Assist for transportation;Help with stairs or ramp for entrance;Assistance with cooking/housework   Can travel by private vehicle   No    Equipment Recommendations None recommended by PT     Functional Status Assessment Patient has had a recent decline in their functional status and demonstrates the ability to make significant improvements in function in a reasonable and predictable amount of time.     Precautions / Restrictions Precautions Precautions: Fall Restrictions Weight Bearing Restrictions Per Provider Order: No      Mobility  Bed Mobility Overal bed mobility: Needs Assistance Bed Mobility: Supine to Sit, Sit to Supine,  Rolling Rolling: Total assist   Supine to sit: +2 for physical assistance, Total assist Sit to supine: +2 for physical assistance, Total assist   General bed mobility comments: pt typically sleeps in her lift chair, requires assist for all aspects of bed mobility    Transfers Overall transfer level: Needs assistance Equipment used: Rolling walker (2 wheels) Transfers: Sit to/from Stand Sit to Stand: +2 physical assistance, Max assist, From elevated surface      General transfer comment: assist from elevated bed, simulating height of lift chair at home, with gait belt and bed pad under hips, increased time to adjust feet under hips, flexed posture. Able to take side steps towards [facility] with increased time and MinAx2     Balance Overall balance assessment: Needs assistance Sitting-balance support: Feet supported, Bilateral upper extremity supported Sitting balance-Leahy Scale: Fair Sitting balance - Comments: close guard   Standing balance support: Bilateral upper extremity supported Standing balance-Leahy Scale: Poor Standing balance comment: reliant on RW and external support         Pertinent Vitals/Pain Pain Assessment Pain Assessment: Faces Faces Pain Scale: Hurts little more Pain Location: LEs Pain Descriptors / Indicators: Grimacing, Guarding, Discomfort Pain Intervention(s): Limited activity within patient's tolerance, Monitored during session, Repositioned    Home Living Family/patient expects to be discharged to:: Private residence Living Arrangements: Children (son) Available Help at Discharge: Family;Available PRN/intermittently Type of Home: Mobile home Home Access: Stairs to enter Entrance Stairs-Rails: Right Entrance Stairs-Number of Steps: 2   Home Layout: One level Home Equipment: Grab bars - tub/shower;Grab bars - toilet;Hand held shower head;Rollator (4 wheels);BSC/3in1;Lift  chair Additional Comments: adjustable bed, has a hand rail    Prior Function  Prior Level of Function : Needs assist       Physical Assist : ADLs (physical)   ADLs (physical): Bathing Mobility Comments: used upright rollator for mobility ADLs Comments: CNA comes for 1 hour x3 a week. Help for bathing/dressing and son helps with cooking and housekeeping, Pt can self feed, groom and toilet independently.     Extremity/Trunk Assessment   Upper Extremity Assessment Upper Extremity Assessment: Defer to OT evaluation RUE Deficits / Details: significant, long standing shoulder limitations due to rotator cuff tear, full AROM elbow to hand with generalized weakness RUE Coordination: decreased gross motor LUE Deficits / Details: same as R LUE Coordination: decreased gross motor    Lower Extremity Assessment Lower Extremity Assessment: Generalized weakness (alert to light touch bilaterally, dressings on B lower legs)    Cervical / Trunk Assessment Cervical / Trunk Assessment: Other exceptions;Kyphotic Cervical / Trunk Exceptions: weakness, increased body habitus  Communication   Communication Communication: No apparent difficulties    Cognition Arousal: Alert Behavior During Therapy: Anxious   PT - Cognitive impairments: No apparent impairments    PT - Cognition Comments: fearful of falling Following commands: Impaired Following commands impaired: Follows one step commands with increased time     Cueing Cueing Techniques: Verbal cues, Gestural cues     General Comments General comments (skin integrity, edema, etc.): Son present and supportive throughout session     PT Assessment Patient needs continued PT services  PT Problem List Decreased strength;Decreased activity tolerance;Decreased balance;Decreased mobility;Obesity       PT Treatment Interventions DME instruction;Gait training;Functional mobility training;Therapeutic activities;Therapeutic exercise;Balance training;Neuromuscular re-education;Patient/family education    PT Goals (Current  goals can be found in the Care Plan section)  Acute Rehab PT Goals Patient Stated Goal: to get stronger PT Goal Formulation: With patient/family Time For Goal Achievement: 02/14/24 Potential to Achieve Goals: Good    Frequency Min 2X/week     Co-evaluation PT/OT/SLP Co-Evaluation/Treatment: Yes Reason for Co-Treatment: For patient/therapist safety PT goals addressed during session: Balance;Proper use of DME;Mobility/safety with mobility OT goals addressed during session: ADL's and self-care;Strengthening/ROM       AM-PAC PT "6 Clicks" Mobility  Outcome Measure Help needed turning from your back to your side while in a flat bed without using bedrails?: Total Help needed moving from lying on your back to sitting on the side of a flat bed without using bedrails?: Total Help needed moving to and from a bed to a chair (including a wheelchair)?: Total Help needed standing up from a chair using your arms (e.g., wheelchair or bedside chair)?: Total Help needed to walk in hospital room?: Total Help needed climbing 3-5 steps with a railing? : Total 6 Click Score: 6    End of Session Equipment Utilized During Treatment: Gait belt;Other (comment) (bed pad) Activity Tolerance: Patient tolerated treatment well Patient left: in bed;with call bell/phone within reach;with family/visitor present Nurse Communication: Mobility status PT Visit Diagnosis: Unsteadiness on feet (R26.81);Other abnormalities of gait and mobility (R26.89);Muscle weakness (generalized) (M62.81)    Time: 8469-6295 PT Time Calculation (min) (ACUTE ONLY): 33 min   Charges:   PT Evaluation $PT Eval Low Complexity: 1 Low   PT General Charges $$ ACUTE PT VISIT: 1 Visit       Hilton Cork, PT, DPT Secure Chat Preferred  Rehab Office [phone]   Arturo Morton Brion Aliment 01/31/2024, 2:45 PM

## 2024-01-31 NOTE — Assessment & Plan Note (Signed)
 Remains A&O x4.  Outpatient AMS workup and CT unremarkable.  Suspect hypotension/weakness in setting of cellulitis. -Continue to monitor for confusion -Delirium precautions -Fall precautions -PT/OT

## 2024-01-31 NOTE — Assessment & Plan Note (Signed)
 On trulicity at home. A1c 7.8 this admission. -Very sensitive sliding scale given CKD IV -CBGs ACHS -Insulin + dextrose in setting of hyperkalemia as above

## 2024-01-31 NOTE — Patient Outreach (Signed)
 [name] Coordination   Follow Up Visit Note   01/31/2024 Name: [name] MRN: [id number] DOB: Sep 16, 1941  [name] is a 83 y.o. year old female who sees Evette Georges, MD for primary [name].   Documentation  What matters to the patients health and wellness today?  I have an appointment scheduled today with [name]. However, upon reviewing her chart, I have determined that she is still hospitalized. Therefore, I will send a note to the [name] guide to facilitate the rescheduling of her appointment for a time when she has returned home.    SDOH assessments and interventions completed:  No     [name] Coordination Interventions:  No, not indicated   Follow up plan: No further intervention required.   Encounter Outcome:  Patient Visit Completed   Juanell Fairly RN, BSN, [facility] [place]  [facility], [facility]  [name] Coordinator Phone: [phone]

## 2024-01-31 NOTE — Progress Notes (Signed)
 Daily Progress Note Intern Pager: [phone]  Patient name: ALAUNA HAYDEN Medical record number: [id number] Date of birth: 13-Aug-1941 Age: 83 y.o. Gender: female  Primary Care Provider: Evette Georges, MD Consultants: None Code Status: FULL  Pt Overview and Major Events to Date:  3/20 - Admitted, vancomycin started   Assessment and Plan: GWENIVERE HIRALDO is a 83 y.o. female with a pertinent PMH of CAD, A-fib on Eliquis, CVA, HFpEF, CKD, T2DM, GERD, HTN, and OSA who presented with generalized weakness and was admitted for RLE cellulitis and hyperkalemia, currently with new heart failure exacerbation s/p fluids due to hyperkalemia. Assessment & Plan Cellulitis of right leg Continuing antibiotics.  MAP remains >65, lactate downtrended yesterday.  No concern for sepsis at this time. -Vancomycin per pharmacy, CTX as below -Wound care consult, appreciate assistance -Tylenol 650 mg Q6h PRN -Follow Bcx, NGTD @ 2 days -AM CBC, BMP Hyperkalemia K improved after multiple courses of Lokelma, insulin/dextrose, and sodium bicarb.  EKG without hyperkalemic T wave changes. -Appreciate CCM consult and initial assistance -Caution with spironolactone if resuming outpatinet -AM BMP, Mag; Lokelma as needed -Lokelma -Sodium bicarb 2 amps -Insulin 10u IV, D50 x1 amp -Stop LR, no oral KCl -Repeat K 2-4 hours CHF (congestive heart failure) (HCC) Suspect patient is now with CHF exacerbation s/p fluids that had to be administered with treatments for hyperkalemia.  Will diurese and monitor closely, use caution with further fluids -Holding home spironolactone, carvedilol -Strict I/Os, daily weights -Caution with fluid resuscitation -HF consulted, follow up recommendations:  -Lasix 80 mg IV x1  -MM, PYP negative Urinary tract infection Urinary frequency and urgency x 2 days. -PureWick ordered -Ceftriaxone 1g Q24h -Urine culture growing E. coli, follow up sensitivities -Strict I&Os Generalized  weakness Remains A&O x4.  Outpatient AMS workup and CT unremarkable.  Suspect hypotension/weakness in setting of cellulitis. -Continue to monitor for confusion -Delirium precautions -Fall precautions -PT/OT Type 2 diabetes mellitus (HCC) On trulicity at home. A1c 7.8 this admission. -Very sensitive sliding scale given CKD IV -CBGs ACHS -Insulin + dextrose in setting of hyperkalemia as above Chronic health problem CAD - s/p CABG in 2009 with 2 stents, continue atorvastatin 40 mg daily CKD IV - Monitor Cr Hypertension - See CHF above A-fib - Continue Eliquis 2.5 mg BID (renally dosed), cardiac telemetry Restless leg syndrome - Continue Requip 1 mg at bedtime  Intertrigo - Continue nystatin powder to gluteal folds Hx CVA - Continue Eliquis OSA - Does not use CPAP  FEN/GI: Carb modified, heart healthy PPx: Home Eliquis 2.5 mg BID Dispo: Pending PT recommendations  and clinical stabilization.  Subjective:  This AM, patient states pain is minimal and well controlled.  Objective: Temp:  [97.8 F (36.6 C)-98.8 F (37.1 C)] 98.1 F (36.7 C) (03/21 0329) Pulse Rate:  [40-99] 95 (03/21 0329) Resp:  [17-25] 20 (03/21 0329) BP: (92-142)/(35-110) 136/64 (03/21 0329) SpO2:  [95 %-100 %] 100 % (03/21 0329) Weight:  [98.8 kg-106.6 kg] 106.6 kg (03/21 0329)  Physical Exam: General: Resting comfortably in bed, NAD, alert and at baseline. Cardiovascular: Regular rate and rhythm. Normal S1/S2. No murmurs, rubs, or gallops appreciated. 2+ radial pulses. Pulmonary: Clear bilaterally to ascultation. No wheezes, crackles, or rhonchi. Normal WOB on 1L [place]. Abdominal: Normoactive bowel sounds, nondistended. No tenderness to deep or light palpation. No rebound or guarding. Skin: Warm and dry. Extremities: RLE cellulitis with decreased erythema, though at same marked border as before.  Minimal drainage at wound site.  Mild  accompanying edema, calor. Capillary refill <2 seconds.  Laboratory: Most  recent CBC Lab Results  Component Value Date   WBC 24.5 (H) 01/30/2024   HGB 9.8 (L) 01/30/2024   HCT 31.6 (L) 01/30/2024   MCV 94.9 01/30/2024   PLT 144 (L) 01/30/2024   Most recent BMP    Latest Ref Rng & Units 01/31/2024    1:04 AM  BMP  Glucose 70 - 99 mg/dL 161   BUN 8 - 23 mg/dL 41   Creatinine 0.96 - 1.00 mg/dL 0.45   Sodium 409 - 811 mmol/L 138   Potassium 3.5 - 5.1 mmol/L 5.4   Chloride 98 - 111 mmol/L 112   CO2 22 - 32 mmol/L 19   Calcium 8.9 - 10.3 mg/dL 91.4     Other pertinent labs: -None  New Imaging/Diagnostic Tests: -None  Loriene Taunton, MD 01/31/2024, 8:03 AM  PGY-1, [facility] FPTS Intern pager: [phone], text pages welcome Secure chat group [facility] Teaching Service

## 2024-01-31 NOTE — Assessment & Plan Note (Addendum)
 K improved after multiple courses of Lokelma, insulin/dextrose, and sodium bicarb.  EKG without hyperkalemic T wave changes. -Appreciate CCM consult and initial assistance -Caution with spironolactone if resuming outpatinet -AM BMP, Mag; Lokelma as needed -Lokelma -Sodium bicarb 2 amps -Insulin 10u IV, D50 x1 amp -Stop LR, no oral KCl -Repeat K 2-4 hours

## 2024-01-31 NOTE — Progress Notes (Signed)
  Echocardiogram 2D Echocardiogram has been performed.  Leda Roys RDCS 01/31/2024, 1:48 PM

## 2024-01-31 NOTE — Assessment & Plan Note (Signed)
 CAD - s/p CABG in 2009 with 2 stents, continue atorvastatin 40 mg daily CKD IV - Monitor Cr Hypertension - See CHF above A-fib - Continue Eliquis 2.5 mg BID (renally dosed), cardiac telemetry Restless leg syndrome - Continue Requip 1 mg at bedtime  Intertrigo - Continue nystatin powder to gluteal folds Hx CVA - Continue Eliquis OSA - Does not use CPAP

## 2024-01-31 NOTE — Assessment & Plan Note (Addendum)
 Continuing antibiotics.  MAP remains >65, lactate downtrended yesterday.  No concern for sepsis at this time. -Vancomycin per pharmacy, CTX as below -Wound care consult, appreciate assistance -Tylenol 650 mg Q6h PRN -Follow Bcx, NGTD @ 2 days -AM CBC, BMP

## 2024-01-31 NOTE — Assessment & Plan Note (Addendum)
 Suspect patient is now with CHF exacerbation s/p fluids that had to be administered with treatments for hyperkalemia.  Will diurese and monitor closely, use caution with further fluids -Holding home spironolactone, carvedilol -Strict I/Os, daily weights -Caution with fluid resuscitation -HF consulted, follow up recommendations:  -Lasix 80 mg IV x1  -MM, PYP negative

## 2024-02-01 DIAGNOSIS — A419 Sepsis, unspecified organism: Secondary | ICD-10-CM

## 2024-02-01 DIAGNOSIS — Z7901 Long term (current) use of anticoagulants: Secondary | ICD-10-CM

## 2024-02-01 DIAGNOSIS — I255 Ischemic cardiomyopathy: Secondary | ICD-10-CM | POA: Diagnosis not present

## 2024-02-01 DIAGNOSIS — L03115 Cellulitis of right lower limb: Secondary | ICD-10-CM | POA: Diagnosis not present

## 2024-02-01 DIAGNOSIS — I4811 Longstanding persistent atrial fibrillation: Secondary | ICD-10-CM

## 2024-02-01 DIAGNOSIS — R600 Localized edema: Secondary | ICD-10-CM

## 2024-02-01 DIAGNOSIS — I5021 Acute systolic (congestive) heart failure: Secondary | ICD-10-CM

## 2024-02-01 DIAGNOSIS — Z951 Presence of aortocoronary bypass graft: Secondary | ICD-10-CM

## 2024-02-01 DIAGNOSIS — E875 Hyperkalemia: Secondary | ICD-10-CM | POA: Diagnosis not present

## 2024-02-01 DIAGNOSIS — I2510 Atherosclerotic heart disease of native coronary artery without angina pectoris: Secondary | ICD-10-CM

## 2024-02-01 DIAGNOSIS — N390 Urinary tract infection, site not specified: Secondary | ICD-10-CM | POA: Diagnosis not present

## 2024-02-01 LAB — CBC
HCT: 26.8 % — ABNORMAL LOW (ref 36.0–46.0)
Hemoglobin: 8.6 g/dL — ABNORMAL LOW (ref 12.0–15.0)
MCH: 29.2 pg (ref 26.0–34.0)
MCHC: 32.1 g/dL (ref 30.0–36.0)
MCV: 90.8 fL (ref 80.0–100.0)
Platelets: 157 10*3/uL (ref 150–400)
RBC: 2.95 MIL/uL — ABNORMAL LOW (ref 3.87–5.11)
RDW: 17.7 % — ABNORMAL HIGH (ref 11.5–15.5)
WBC: 12.3 10*3/uL — ABNORMAL HIGH (ref 4.0–10.5)
nRBC: 0 % (ref 0.0–0.2)

## 2024-02-01 LAB — BASIC METABOLIC PANEL
Anion gap: 5 (ref 5–15)
BUN: 47 mg/dL — ABNORMAL HIGH (ref 8–23)
CO2: 17 mmol/L — ABNORMAL LOW (ref 22–32)
Calcium: 9.2 mg/dL (ref 8.9–10.3)
Chloride: 113 mmol/L — ABNORMAL HIGH (ref 98–111)
Creatinine, Ser: 1.88 mg/dL — ABNORMAL HIGH (ref 0.44–1.00)
GFR, Estimated: 26 mL/min — ABNORMAL LOW (ref >60)
Glucose, Bld: 240 mg/dL — ABNORMAL HIGH (ref 70–99)
Potassium: 4 mmol/L (ref 3.5–5.1)
Sodium: 135 mmol/L (ref 135–145)

## 2024-02-01 LAB — MAGNESIUM: Magnesium: 1.7 mg/dL (ref 1.7–2.4)

## 2024-02-01 LAB — GLUCOSE, CAPILLARY
Glucose-Capillary: 193 mg/dL — ABNORMAL HIGH (ref 70–99)
Glucose-Capillary: 220 mg/dL — ABNORMAL HIGH (ref 70–99)
Glucose-Capillary: 261 mg/dL — ABNORMAL HIGH (ref 70–99)
Glucose-Capillary: 267 mg/dL — ABNORMAL HIGH (ref 70–99)

## 2024-02-01 MED ORDER — INSULIN ASPART 100 UNIT/ML IJ SOLN
0.0000 [IU] | Freq: Three times a day (TID) | INTRAMUSCULAR | Status: DC
  Administered 2024-02-01: 5 [IU] via SUBCUTANEOUS
  Administered 2024-02-01 – 2024-02-02 (×2): 3 [IU] via SUBCUTANEOUS
  Administered 2024-02-02: 7 [IU] via SUBCUTANEOUS
  Administered 2024-02-02: 5 [IU] via SUBCUTANEOUS
  Administered 2024-02-03: 3 [IU] via SUBCUTANEOUS
  Administered 2024-02-03 (×2): 5 [IU] via SUBCUTANEOUS
  Administered 2024-02-04 (×3): 3 [IU] via SUBCUTANEOUS
  Administered 2024-02-05: 2 [IU] via SUBCUTANEOUS
  Administered 2024-02-05: 3 [IU] via SUBCUTANEOUS
  Administered 2024-02-05: 7 [IU] via SUBCUTANEOUS
  Administered 2024-02-06: 2 [IU] via SUBCUTANEOUS
  Administered 2024-02-06: 3 [IU] via SUBCUTANEOUS
  Administered 2024-02-06 – 2024-02-07 (×2): 2 [IU] via SUBCUTANEOUS
  Administered 2024-02-07: 1 [IU] via SUBCUTANEOUS

## 2024-02-01 MED ORDER — BUMETANIDE 1 MG PO TABS
1.0000 mg | ORAL_TABLET | Freq: Two times a day (BID) | ORAL | Status: DC
  Administered 2024-02-01 – 2024-02-04 (×6): 1 mg via ORAL
  Filled 2024-02-01 (×7): qty 1

## 2024-02-01 NOTE — Assessment & Plan Note (Addendum)
 Suspected CHF exacerbation secondary to IV fluid treatment from hypotension in the setting of sepsis at initial presentation.  Received IV Lasix 80 mg 1 time yesterday with decent UOP of 1.3L. Echo yesterday show EF of 35-40% (previously 50% on 01/14/23) with global hypokinesis of the LV.  Today patient still appears volume up with labored speech, will likely benefit from continued diuresis. Cardiology following, appreciate their recommendations. -Holding home spironolactone, carvedilol -Strict I/Os, daily weights -Caution with fluid resuscitation -HF consulted, follow up recommendations:

## 2024-02-01 NOTE — Assessment & Plan Note (Addendum)
 Stable, K at normal range today -Appreciate CCM consult and initial assistance -Caution with spironolactone if resuming outpatinet -AM BMP, Mag

## 2024-02-01 NOTE — Plan of Care (Signed)
  Problem: Education: Goal: Knowledge of General Education information will improve Description: Including pain rating scale, medication(s)/side effects and non-pharmacologic comfort measures Outcome: Progressing   Problem: Clinical Measurements: Goal: Ability to maintain clinical measurements within normal limits will improve Outcome: Progressing Goal: Will remain free from infection Outcome: Progressing Goal: Diagnostic test results will improve Outcome: Progressing Goal: Respiratory complications will improve Outcome: Progressing Goal: Cardiovascular complication will be avoided Outcome: Progressing   Problem: Nutrition: Goal: Adequate nutrition will be maintained Outcome: Progressing   Problem: Coping: Goal: Level of anxiety will decrease Outcome: Progressing   Problem: Elimination: Goal: Will not experience complications related to bowel motility Outcome: Progressing Goal: Will not experience complications related to urinary retention Outcome: Progressing   Problem: Pain Managment: Goal: General experience of comfort will improve and/or be controlled Outcome: Progressing   Problem: Safety: Goal: Ability to remain free from injury will improve Outcome: Progressing   Problem: Health Behavior/Discharge Planning: Goal: Ability to manage health-related needs will improve Outcome: Not Progressing   Problem: Activity: Goal: Risk for activity intolerance will decrease Outcome: Not Progressing

## 2024-02-01 NOTE — Progress Notes (Addendum)
 Daily Progress Note Intern Pager: [phone]  Patient name: [name] Medical record number: [id number] Date of birth: [birth year] Age: 83 y.o. Gender: female  Primary Care Provider: Evette Georges, MD Consultants: None Code Status: Full  Pt Overview and Major Events to Date:  3/20-admitted, vancomycin started  Assessment and Plan: [name] is an 83 year old female with past medical history of CAD, A-fib on Eliquis, CVA, HFpEF, CKD, T2DM, GERD, HTN, and OSA mated for generalized weakness suspected to be due to sepsis in the setting of RLE cellulitis and also found to be hyperkalemic.  Mission complicated by recent concerns for HF exacerbation.  Assessment & Plan Cellulitis of right leg Admitted for sepsis contributing to cellulitis of right leg.  Patient has remained afebrile and leukocytosis improving.  Currently on IV CTX and discontinue Vanc -Wound care consult, appreciate assistance -Tylenol 650 mg Q6h PRN -Follow Bcx, NGTD @ 2 days -AM CBC, BMP Hyperkalemia Stable, K at normal range today -Appreciate CCM consult and initial assistance -Caution with spironolactone if resuming outpatinet -AM BMP, Mag CHF (congestive heart failure) (HCC) Suspected CHF exacerbation secondary to IV fluid treatment from hypotension in the setting of sepsis at initial presentation.  Received IV Lasix 80 mg 1 time yesterday with decent UOP of 1.3L. Echo yesterday show EF of 35-40% (previously 50% on 01/14/23) with global hypokinesis of the LV.  Today patient still appears volume up with labored speech, will likely benefit from continued diuresis. Cardiology following, appreciate their recommendations. -Holding home spironolactone, carvedilol -Strict I/Os, daily weights -Caution with fluid resuscitation -HF consulted, follow up recommendations: Urinary tract infection Urinary frequency and urgency x 2 days. -PureWick ordered -Ceftriaxone 1g Q24h (day 3/3) -Urine culture growing E. coli,  sensitivity back -Strict I&Os Generalized weakness Remains A&O x4.  Outpatient AMS workup and CT unremarkable.  Suspect hypotension/weakness in setting of cellulitis. -Continue to monitor for confusion -Delirium precautions -Fall precautions -PT/OT Type 2 diabetes mellitus (HCC)  A1c 7.8 this admission.  Home med includes weekly Trulicity.  CBG is elevated here we will consider changing to sensitive SSI -Switched to sensitive sliding scale -CBGs ACHS  Chronic health problem CAD - s/p CABG in 2009 with 2 stents, continue atorvastatin 40 mg daily CKD IV - Monitor Cr Hypertension - See CHF above A-fib - Continue Eliquis 2.5 mg BID (renally dosed), cardiac telemetry Restless leg syndrome - Continue Requip 1 mg at bedtime  Intertrigo - Continue nystatin powder to gluteal folds Hx CVA - Continue Eliquis OSA - Does not use CPAP   FEN/GI: Carb modified, heart healthy PPx: Home Eliquis 2.5 mg twice daily Dispo:Pending PT recommendations   Barriers include clinical improvement..   Subjective:  Patient laying comfortably in bed with son at bedside.  She says she has slightly improved from the time of admission but not at baseline.  Son reports she has had fluid retention for the past few weeks up to about 6 weeks ago. Patient report still feeling tired and short of breath.  Objective: Temp:  [97.5 F (36.4 C)-98.2 F (36.8 C)] 97.9 F (36.6 C) (03/22 0135) Pulse Rate:  [44-93] 56 (03/22 0435) Resp:  [15-32] 19 (03/22 0435) BP: (116-137)/(52-65) 116/65 (03/22 0400) SpO2:  [98 %-100 %] 100 % (03/22 0435) Weight:  [104.1 kg-107.8 kg] 107.8 kg (03/22 0430) Physical Exam: General: Awake,  non toxic appearing, NAD CV: R irregular rate, no murmurs. +JVD Pulm: CTAB, good WOB on RA, labored speech Abd: Soft, no distension, no tenderness Ext:  +  2 BLE edema, RLE wrapped in gauze and wound draining (Unable to visualize wound)  Laboratory: Most recent CBC Lab Results  Component Value Date    WBC 12.3 (H) 02/01/2024   HGB 8.6 (L) 02/01/2024   HCT 26.8 (L) 02/01/2024   MCV 90.8 02/01/2024   PLT 157 02/01/2024   Most recent BMP    Latest Ref Rng & Units 02/01/2024    3:11 AM  BMP  Glucose 70 - 99 mg/dL 161   BUN 8 - 23 mg/dL 47   Creatinine 0.96 - 1.00 mg/dL 0.45   Sodium 409 - 811 mmol/L 135   Potassium 3.5 - 5.1 mmol/L 4.0   Chloride 98 - 111 mmol/L 113   CO2 22 - 32 mmol/L 17   Calcium 8.9 - 10.3 mg/dL 9.2     Imaging/Diagnostic Tests: ECHOCARDIOGRAM COMPLETE Result Date: 01/31/2024 IMPRESSIONS   1. Technically difficult study with reduced echo windows   2. No apical thrombus with Definity contrast. Left ventricular ejection fraction, by estimation, is 35 to 40%. Left ventricular ejection fraction by PLAX is 36 %. The left ventricle has moderately decreased function. The left ventricle demonstrates global hypokinesis. The left ventricular internal cavity size was mildly to moderately dilated. Left ventricular diastolic function could not be evaluated.   3. Right ventricular systolic function is mildly reduced. The right ventricular size is normal. Tricuspid regurgitation signal is inadequate for assessing PA pressure.   4. Left atrial size was moderately dilated.   5. The mitral valve is abnormal. Mild mitral valve regurgitation.  6. The aortic valve is tricuspid. Aortic valve regurgitation is trivial. Aortic valve sclerosis/calcification is present, without any evidence of aortic stenosis.   7. The inferior vena cava is dilated in size with <50% respiratory variability, suggesting right atrial pressure of 15 mmHg.   8. Rhythm strip during this exam demonstrates atrial fibrillation. Comparison(s): Changes from prior study are noted. 01/14/2023: LVEF 50%.    [name] Simon, MD 02/01/2024, 7:42 AM  PGY-3, [facility] FPTS Intern pager: [phone], text pages welcome Secure chat group [facility] Teaching Service

## 2024-02-01 NOTE — Progress Notes (Signed)
 Patient Name: [name] Date of Encounter: 02/01/2024 [facility] Cardiologist: Armanda Magic, MD   Interval Summary  .    Denies anginal chest pain. No events overnight. Shortness of breath started approximately 3 weeks ago according to the patient and son states that she has been congested for at least 6 weeks. Son at bedside  Vital Signs .    Vitals:   02/01/24 0430 02/01/24 0435 02/01/24 0814 02/01/24 1130  BP:   119/70 116/63  Pulse: 73 (!) 56 64 (!) 59  Resp: (!) 32 19 19 20   Temp:   97.8 F (36.6 C) 98 F (36.7 C)  TempSrc:   Oral Oral  SpO2: 99% 100% 99% 100%  Weight: 107.8 kg     Height:        Intake/Output Summary (Last 24 hours) at 02/01/2024 1205 Last data filed at 02/01/2024 0815 Gross per 24 hour  Intake 240 ml  Output 1700 ml  Net -1460 ml      02/01/2024    4:30 AM 01/31/2024   10:30 AM 01/31/2024    3:29 AM  Last 3 Weights  Weight (lbs) 237 lb 10.5 oz 229 lb 8 oz 235 lb 0.2 oz  Weight (kg) 107.8 kg 104.1 kg 106.6 kg      Telemetry/ECG    Atrial fibrillation with controlled ventricular rate.  PVCs, occasional ventricular runs, 1 episode of NSVT 5 beats in duration (asymptomatic)- Personally Reviewed  CV Studies    Echocardiogram  01/14/2023: LVEF 50%, regional wall motion abnormalities, see report for additional details  01/31/2024: Technically difficult study. No apical thrombus with Definity contrast. LVEF 35-40%. Global hypokinesis. Left ventricular mild to moderately dilated Diastolic dysfunction not reported due to A-fib. RV systolic function mildly reduced, size normal. Moderate left atrial dilatation. Mild MR, trivial AR, estimated RAP 15 mmHg.    Physical Exam .   GEN: No acute distress.   Neck: + JVD, V waves Cardiac: Irregularly irregular no murmurs Respiratory: Clear to auscultation bilaterally. GI: Soft, nontender, non-distended  MS:  Bilaterally wrapped legs, cellulitis, suspect edema although would not  let me touch due to pain  Patient Profile    [name] is a 83 y.o. female has hx of CAD status post CABG 2009, ischemic cardiomyopathy with HFrEF, persistent atrial fibrillation, CVA May 2017, CKD stage IV, diabetes, GI bleed 2018, OSA.  Currently patient admitted for hyperkalemia and sepsis secondary to cellulitis.  Cardiology asked to see due to CHF history  Assessment & Plan .     Ischemic cardiomyopathy with heart failure with reduced EF March 2024 echo 50% with mildly reduced RV function, RVSP 47.3.   Echo from this admission notes LVEF 35-40%, RV function mildly reduced, estimated RAP 15 mmHg, see report for additional details  Net IO Since Admission: -736.32 mL [02/01/24 1205] BNP 145 on arrival  GDMT has been held secondary to renal function and hyperkalemia. She is volume overload on physical examination as well as echocardiographic findings. Will restart Bumex 1 mg twice daily. Strict I's and O's and daily weights. Uptitrate GDMT as hemodynamics and laboratory values allow.  Keeping consideration given her episode of hyperkalemia. Avoid SGLT2 inhibitors secondary to her urinary incontinence and history of UTI Son is requesting evaluation for possible CardioMEMS implantation.  Informed him that this is an outpatient procedure and given her clinical presentation with bilateral lower extremity cellulitis and UTI it is not the ideal time.  They will discuss further when they  follow up with Dr. Freida Busman.  Hyperkalemia Present on arrival. Was on spironolactone, torsemide, and potassium supplements Have received Lokelma. Morning potassium is 4.0 now, on presentation it was >7.5 Monitor with BMP  Sepsis secondary to cellulitis Antibiotics per primary team.  CAD status post CABG 2009 Has history of reduced LVEF which improved to 50% as of echo from March 2024. Echocardiogram during current hospitalization notes reduction in LVEF to 35-40%. Clinically denies anginal chest  pain Conservative management for now unless change in clinical status. Will focus on diuresis for now. Not on antiplatelet therapies as she is on renally dosed Eliquis. Continue Lipitor 40 mg p.o. daily  Atrial fibrillation Likely longstanding persistent  Ventricular rate is well-controlled. Rate control: N/A. Rhythm control: N/A. Thromboembolic prophylaxis: Eliquis 2.5 mg p.o. twice daily. Continue telemetry. Monitor for now.  Plan of care discussed with her son present at bedside.  For questions or updates, please contact [facility] Please consult [email or web address] for contact info under     Tessa Lerner, DO, [facility]  [facility]  [address] [place], [place] [address] 12:05 PM 02/01/24

## 2024-02-01 NOTE — Assessment & Plan Note (Addendum)
 A1c 7.8 this admission.  Home med includes weekly Trulicity.  CBG is elevated here we will consider changing to sensitive SSI -Switched to sensitive sliding scale -CBGs ACHS

## 2024-02-01 NOTE — Assessment & Plan Note (Addendum)
 Admitted for sepsis contributing to cellulitis of right leg.  Patient has remained afebrile and leukocytosis improving.  Currently on IV CTX and discontinue Vanc -Wound care consult, appreciate assistance -Tylenol 650 mg Q6h PRN -Follow Bcx, NGTD @ 2 days -AM CBC, BMP

## 2024-02-01 NOTE — Assessment & Plan Note (Addendum)
 Urinary frequency and urgency x 2 days. -PureWick ordered -Ceftriaxone 1g Q24h (day 3/3) -Urine culture growing E. coli, sensitivity back -Strict I&Os

## 2024-02-01 NOTE — Assessment & Plan Note (Signed)
 CAD - s/p CABG in 2009 with 2 stents, continue atorvastatin 40 mg daily CKD IV - Monitor Cr Hypertension - See CHF above A-fib - Continue Eliquis 2.5 mg BID (renally dosed), cardiac telemetry Restless leg syndrome - Continue Requip 1 mg at bedtime  Intertrigo - Continue nystatin powder to gluteal folds Hx CVA - Continue Eliquis OSA - Does not use CPAP

## 2024-02-01 NOTE — Assessment & Plan Note (Signed)
 Remains A&O x4.  Outpatient AMS workup and CT unremarkable.  Suspect hypotension/weakness in setting of cellulitis. -Continue to monitor for confusion -Delirium precautions -Fall precautions -PT/OT

## 2024-02-01 NOTE — [place] FL2 (Signed)
 [place] MEDICAID FL2 LEVEL OF CARE FORM     IDENTIFICATION  Patient Name: [name] Birthdate: [birth year] Sex: female Admission Date (Current Location): 01/29/2024  [facility] and [place] Number:  Producer, television/film/video and Address:  [facility], [address] [place], [place] [address]      Provider Number: [id number]  Attending Physician Name and Address:  Caro Laroche, DO  Relative Name and Phone Number:       Current Level of Care: Hospital Recommended Level of Care: Skilled Nursing Facility Prior Approval Number:    Date Approved/Denied:   PASRR Number: [id number]  Discharge Plan: SNF    Current Diagnoses: Patient Active Problem List   Diagnosis Date Noted   Cellulitis of right leg 01/30/2024   Urinary tract infection 01/30/2024   Generalized weakness 01/30/2024   Type 2 diabetes mellitus (HCC) 01/30/2024   Hyperkalemia 01/30/2024   Chronic health problem 01/30/2024   Cognitive and behavioral changes 01/29/2024   Sleep concern 12/23/2023   Vaginal bleeding 11/25/2023   Osteoarthritis of both shoulders 05/22/2023   Advanced nonexudative age-related macular degeneration of right eye with subfoveal involvement 07/31/2022   Intermediate stage nonexudative age-related macular degeneration of left eye 03/28/2022   Advanced nonexudative age-related macular degeneration of right eye without subfoveal involvement 09/28/2021   Urinary frequency 09/01/2021   History of anemia 05/08/2021   Weight loss 05/08/2021   Vitreous hemorrhage of right eye (HCC) 04/17/2021   Severe nonproliferative diabetic retinopathy of both eyes (HCC) 06/14/2020   Exudative age-related macular degeneration of right eye with inactive choroidal neovascularization (HCC) 05/02/2020   Exudative age-related macular degeneration of left eye with inactive choroidal neovascularization (HCC) 03/29/2020   Vitreomacular adhesion of left eye 03/29/2020   Left retinoschisis  03/29/2020   CHF (congestive heart failure) (HCC)    Vertebral osteomyelitis (HCC) 10/06/2018   Foot deformity 08/05/2018   Candidal intertrigo 08/05/2018   Iron deficiency anemia 05/19/2018   OSA on CPAP    Hypertension    Facial weakness, post-stroke    Chronic lower back pain    Venous stasis 07/18/2017   Blood loss anemia 04/09/2017   History of CVA with residual deficit 03/26/2017   Pulmonary hypertension (HCC) 03/26/2017   Hemorrhage of colon due to diverticulosis 03/26/2017   Chronic pain syndrome    Coronary artery disease involving coronary bypass graft of native heart without angina pectoris    DM type 2 with diabetic peripheral neuropathy (HCC)    Thrombocytopenia (HCC)    CKD (chronic kidney disease) stage 4, GFR 15-29 ml/min (HCC) 03/30/2016   Chronic anticoagulation 03/30/2016   Hyperlipidemia 01/31/2009   Chronic systolic heart failure (HCC) 01/31/2009   Atrial fibrillation (HCC) 01/31/2009   Ischemic cardiomyopathy 01/31/2009   CAD (coronary artery disease) 05/12/2008    Orientation RESPIRATION BLADDER Height & Weight     Self, Place  Normal Incontinent, External catheter Weight: 237 lb 10.5 oz (107.8 kg) Height:  5\' 8"  (172.7 cm)  BEHAVIORAL SYMPTOMS/MOOD NEUROLOGICAL BOWEL NUTRITION STATUS      Continent Diet (see dc summary)  AMBULATORY STATUS COMMUNICATION OF NEEDS Skin   Extensive Assist Verbally PU Stage and Appropriate Care, Other (Comment) (Pressure Injury - Buttocks Right Stage 2; Wound / Incision - Venous stasis ulcer Pretibial Right, Lateral;   Wound / Incision; Other (Comment) Pretibial Left;Lateral)                       Personal  Care Assistance Level of Assistance  Feeding, Dressing, Bathing Bathing Assistance: Maximum assistance Feeding assistance: Limited assistance Dressing Assistance: Maximum assistance     Functional Limitations Info  Sight, Hearing, Speech Sight Info: Adequate Hearing Info: Adequate Speech Info: Adequate     SPECIAL CARE FACTORS FREQUENCY  PT (By licensed PT), OT (By licensed OT)     PT Frequency: 5x week OT Frequency: 5x week            Contractures Contractures Info: Not present    Additional Factors Info  Code Status, Allergies, Insulin Sliding Scale Code Status Info: DNR Interven Allergies Info: Benazepril, Cymbalta (Duloxetine Hcl), Ozempic (0.25 Or 0.5 Mg-dose) (Semaglutide(0.25 Or 0.5mg -dos)), Tape, Angiotensin Receptor Blockers, Bactrim (Sulfamethoxazole-trimethoprim), Fe-succ-c-thre-b12-des Stomach   Insulin Sliding Scale Info: see dc summary       Current Medications (02/01/2024):  This is the current hospital active medication list Current Facility-Administered Medications  Medication Dose Route Frequency Provider Last Rate Last Admin   acetaminophen (TYLENOL) tablet 650 mg  650 mg Oral Q6H PRN Everhart, Kirstie, DO   650 mg at 02/01/24 1610   Or   acetaminophen (TYLENOL) suppository 650 mg  650 mg Rectal Q6H PRN Everhart, Kirstie, DO       apixaban (ELIQUIS) tablet 2.5 mg  2.5 mg Oral BID Everhart, Kirstie, DO   2.5 mg at 02/01/24 0818   atorvastatin (LIPITOR) tablet 40 mg  40 mg Oral Daily Everhart, Kirstie, DO   40 mg at 02/01/24 0818   brimonidine (ALPHAGAN) 0.2 % ophthalmic solution 1 drop  1 drop Both Eyes BID Tiffany Kocher, DO   1 drop at 02/01/24 0818   cefTRIAXone (ROCEPHIN) 1 g in sodium chloride 0.9 % 100 mL IVPB  1 g Intravenous Q24H Cyndia Skeeters, DO 200 mL/hr at 01/31/24 2058 1 g at 01/31/24 2058   insulin aspart (novoLOG) injection 0-9 Units  0-9 Units Subcutaneous TID WC Jerre Simon, MD       megestrol (MEGACE) tablet 40 mg  40 mg Oral Daily Everhart, Kirstie, DO   40 mg at 02/01/24 0818   nystatin (MYCOSTATIN/NYSTOP) topical powder 1 Application  1 Application Topical BID Everhart, Kirstie, DO   1 Application at 02/01/24 0818   rOPINIRole (REQUIP) tablet 1 mg  1 mg Oral QHS Everhart, Kirstie, DO   1 mg at 01/31/24 2121   vancomycin (VANCOCIN) IVPB  1000 mg/200 mL premix  1,000 mg Intravenous Q48H Caro Laroche, DO 200 mL/hr at 02/01/24 0134 1,000 mg at 02/01/24 0134     Discharge Medications: Please see discharge summary for a list of discharge medications.  Relevant Imaging Results:  Relevant Lab Results:   Additional Information SSN: [id number]  Michaela Corner, LCSWA

## 2024-02-01 NOTE — TOC Initial Note (Signed)
 Transition of Care [facility]) - Initial/Assessment Note    Patient Details  Name: [name] MRN: [id number] Date of Birth: [birth year]  [facility]) CM/SW Contact:    Michaela Corner, LCSWA Phone Number: 02/01/2024, 10:14 AM  Clinical Narrative:     CSW met patient and son at bedside and introduced self/role. CSW discussed PT recs for SNF. Per patients son, she has been to [facility] in the past and they would like CSW to fax referral there. Son and patient gave CSW permission to fax referrals to other facilities as well.  SNF workup done, will need to provide bed offers.   TOC will continue to follow.    Expected Discharge Plan: Skilled Nursing Facility Barriers to Discharge: Continued Medical Work up, SNF Pending bed offer, Insurance Authorization   Patient Goals and CMS Choice Patient states their goals for this hospitalization and ongoing recovery are:: To get better          Expected Discharge Plan and Services In-house Referral: Clinical Social Work     Living arrangements for the past 2 months: Single Family Home                                      Prior Living Arrangements/Services Living arrangements for the past 2 months: Single Family Home Lives with:: Adult Children Patient language and need for interpreter reviewed:: Yes Do you feel safe going back to the place where you live?: Yes      Need for Family Participation in Patient Care: Yes (Comment) Care giver support system in place?: Yes (comment)   Criminal Activity/Legal Involvement Pertinent to Current Situation/Hospitalization: No - Comment as needed  Activities of Daily Living   ADL Screening (condition at time of admission) Independently performs ADLs?: No Does the patient have a NEW difficulty with bathing/dressing/toileting/self-feeding that is expected to last >3 days?: No (needs assist) Does the patient have a NEW difficulty with getting in/out of bed, walking, or climbing  stairs that is expected to last >3 days?: No (needs assist up with walker) Does the patient have a NEW difficulty with communication that is expected to last >3 days?: No Is the patient deaf or have difficulty hearing?: Yes Does the patient have difficulty seeing, even when wearing glasses/contacts?: Yes Does the patient have difficulty concentrating, remembering, or making decisions?: Yes  Permission Sought/Granted Permission sought to share information with : Facility Medical sales representative, Family Supports Permission granted to share information with : Yes, Verbal Permission Granted  Share Information with NAME: Presleigh, Feldstein  Permission granted to share info w AGENCY: SNFs  Permission granted to share info w Relationship: son  Permission granted to share info w Contact Information: [phone]  Emotional Assessment Appearance:: Appears stated age Attitude/Demeanor/Rapport: Engaged Affect (typically observed): Pleasant Orientation: : Oriented to Self, Oriented to Place Alcohol / Substance Use: Not Applicable Psych Involvement: No (comment)  Admission diagnosis:  Lower urinary tract infectious disease [N39.0] Cellulitis of right leg [L03.115] Generalized weakness [R53.1] Bilateral lower extremity edema [R60.0] Sepsis, due to unspecified organism, unspecified whether acute organ dysfunction present [facility]) [A41.9] Patient Active Problem List   Diagnosis Date Noted   Cellulitis of right leg 01/30/2024   Urinary tract infection 01/30/2024   Generalized weakness 01/30/2024   Type 2 diabetes mellitus (HCC) 01/30/2024   Hyperkalemia 01/30/2024   Chronic health problem 01/30/2024   Cognitive and behavioral changes 01/29/2024  Sleep concern 12/23/2023   Vaginal bleeding 11/25/2023   Osteoarthritis of both shoulders 05/22/2023   Advanced nonexudative age-related macular degeneration of right eye with subfoveal involvement 07/31/2022   Intermediate stage nonexudative  age-related macular degeneration of left eye 03/28/2022   Advanced nonexudative age-related macular degeneration of right eye without subfoveal involvement 09/28/2021   Urinary frequency 09/01/2021   History of anemia 05/08/2021   Weight loss 05/08/2021   Vitreous hemorrhage of right eye (HCC) 04/17/2021   Severe nonproliferative diabetic retinopathy of both eyes (HCC) 06/14/2020   Exudative age-related macular degeneration of right eye with inactive choroidal neovascularization (HCC) 05/02/2020   Exudative age-related macular degeneration of left eye with inactive choroidal neovascularization (HCC) 03/29/2020   Vitreomacular adhesion of left eye 03/29/2020   Left retinoschisis 03/29/2020   CHF (congestive heart failure) (HCC)    Vertebral osteomyelitis (HCC) 10/06/2018   Foot deformity 08/05/2018   Candidal intertrigo 08/05/2018   Iron deficiency anemia 05/19/2018   OSA on CPAP    Hypertension    Facial weakness, post-stroke    Chronic lower back pain    Venous stasis 07/18/2017   Blood loss anemia 04/09/2017   History of CVA with residual deficit 03/26/2017   Pulmonary hypertension (HCC) 03/26/2017   Hemorrhage of colon due to diverticulosis 03/26/2017   Chronic pain syndrome    Coronary artery disease involving coronary bypass graft of native heart without angina pectoris    DM type 2 with diabetic peripheral neuropathy (HCC)    Thrombocytopenia (HCC)    CKD (chronic kidney disease) stage 4, GFR 15-29 ml/min (HCC) 03/30/2016   Chronic anticoagulation 03/30/2016   Hyperlipidemia 01/31/2009   Chronic systolic heart failure (HCC) 01/31/2009   Atrial fibrillation (HCC) 01/31/2009   Ischemic cardiomyopathy 01/31/2009   CAD (coronary artery disease) 05/12/2008   PCP:  Evette Georges, MD Pharmacy:   [facility] - [place], [place] - [address] [place] [address] [place] [address] Phone: [phone] Fax: [phone]  [facility] - [place], [place] -  [address] [facility] [address] Portal to Registered [facility] Sites [address] [place] [address] Phone: [phone] Fax: [phone]     Social Drivers of Health (SDOH) Social History: SDOH Screenings   Food Insecurity: No Food Insecurity (01/30/2024)  Housing: Low Risk  (01/30/2024)  Transportation Needs: No Transportation Needs (01/30/2024)  Utilities: Not At Risk (01/30/2024)  Alcohol Screen: Low Risk  (10/23/2023)  Depression (PHQ2-9): High Risk (01/29/2024)  Financial Resource Strain: Low Risk  (05/06/2023)  Physical Activity: Inactive (09/24/2023)  Social Connections: Socially Isolated (01/30/2024)  Stress: No Stress Concern Present (05/06/2023)  Tobacco Use: Low Risk  (01/30/2024)  Health Literacy: Adequate Health Literacy (09/24/2023)   SDOH Interventions:     Readmission Risk Interventions     No data to display

## 2024-02-02 DIAGNOSIS — I4729 Other ventricular tachycardia: Secondary | ICD-10-CM

## 2024-02-02 DIAGNOSIS — I493 Ventricular premature depolarization: Secondary | ICD-10-CM

## 2024-02-02 DIAGNOSIS — I255 Ischemic cardiomyopathy: Secondary | ICD-10-CM | POA: Diagnosis not present

## 2024-02-02 DIAGNOSIS — L03115 Cellulitis of right lower limb: Secondary | ICD-10-CM | POA: Diagnosis not present

## 2024-02-02 DIAGNOSIS — I5021 Acute systolic (congestive) heart failure: Secondary | ICD-10-CM | POA: Diagnosis not present

## 2024-02-02 DIAGNOSIS — Z951 Presence of aortocoronary bypass graft: Secondary | ICD-10-CM | POA: Diagnosis not present

## 2024-02-02 DIAGNOSIS — I2510 Atherosclerotic heart disease of native coronary artery without angina pectoris: Secondary | ICD-10-CM | POA: Diagnosis not present

## 2024-02-02 LAB — CBC
HCT: 27.5 % — ABNORMAL LOW (ref 36.0–46.0)
Hemoglobin: 8.9 g/dL — ABNORMAL LOW (ref 12.0–15.0)
MCH: 29 pg (ref 26.0–34.0)
MCHC: 32.4 g/dL (ref 30.0–36.0)
MCV: 89.6 fL (ref 80.0–100.0)
Platelets: 174 10*3/uL (ref 150–400)
RBC: 3.07 MIL/uL — ABNORMAL LOW (ref 3.87–5.11)
RDW: 17.5 % — ABNORMAL HIGH (ref 11.5–15.5)
WBC: 8.9 10*3/uL (ref 4.0–10.5)
nRBC: 0 % (ref 0.0–0.2)

## 2024-02-02 LAB — BASIC METABOLIC PANEL
Anion gap: 6 (ref 5–15)
BUN: 51 mg/dL — ABNORMAL HIGH (ref 8–23)
CO2: 18 mmol/L — ABNORMAL LOW (ref 22–32)
Calcium: 9.2 mg/dL (ref 8.9–10.3)
Chloride: 110 mmol/L (ref 98–111)
Creatinine, Ser: 1.72 mg/dL — ABNORMAL HIGH (ref 0.44–1.00)
GFR, Estimated: 29 mL/min — ABNORMAL LOW (ref >60)
Glucose, Bld: 267 mg/dL — ABNORMAL HIGH (ref 70–99)
Potassium: 4 mmol/L (ref 3.5–5.1)
Sodium: 134 mmol/L — ABNORMAL LOW (ref 135–145)

## 2024-02-02 LAB — URINE CULTURE: Culture: >=100000 — AB

## 2024-02-02 LAB — GLUCOSE, CAPILLARY
Glucose-Capillary: 224 mg/dL — ABNORMAL HIGH (ref 70–99)
Glucose-Capillary: 319 mg/dL — ABNORMAL HIGH (ref 70–99)
Glucose-Capillary: 298 mg/dL — ABNORMAL HIGH (ref 70–99)
Glucose-Capillary: 232 mg/dL — ABNORMAL HIGH (ref 70–99)

## 2024-02-02 LAB — MAGNESIUM: Magnesium: 1.7 mg/dL (ref 1.7–2.4)

## 2024-02-02 MED ORDER — MENTHOL 3 MG MT LOZG
1.0000 | LOZENGE | OROMUCOSAL | Status: DC | PRN
  Administered 2024-02-02: 3 mg via ORAL
  Filled 2024-02-02: qty 9

## 2024-02-02 MED ORDER — MELATONIN 3 MG PO TABS
3.0000 mg | ORAL_TABLET | Freq: Every day | ORAL | Status: DC
  Administered 2024-02-02 – 2024-02-06 (×5): 3 mg via ORAL
  Filled 2024-02-02 (×5): qty 1

## 2024-02-02 MED ORDER — CEPHALEXIN 500 MG PO CAPS
500.0000 mg | ORAL_CAPSULE | Freq: Two times a day (BID) | ORAL | Status: AC
  Administered 2024-02-02 – 2024-02-05 (×6): 500 mg via ORAL
  Filled 2024-02-02 (×6): qty 1

## 2024-02-02 MED ORDER — MAGNESIUM SULFATE IN D5W 1-5 GM/100ML-% IV SOLN
1.0000 g | Freq: Once | INTRAVENOUS | Status: AC
  Administered 2024-02-02: 1 g via INTRAVENOUS
  Filled 2024-02-02: qty 100

## 2024-02-02 NOTE — Assessment & Plan Note (Addendum)
 Remains A&Ox4.  Suspect hypotension/weakness in setting of cellulitis/UTI.  Concerning nighttime wakefulness and daytime sleep, high risk for delirium. -Continue to monitor for confusion closely -Delirium precautions -Add melatonin 3 mg QHS -Fall precautions -PT/OT

## 2024-02-02 NOTE — Assessment & Plan Note (Signed)
 Asymptomatic with intermittent PVCs on tele. -Appreciate Cardiology recs:  -Start low-dose metoprolol succinate 25 mg PO daily

## 2024-02-02 NOTE — Progress Notes (Signed)
 Patient Name: [name] Date of Encounter: 02/02/2024 [facility] Cardiologist: Armanda Magic, MD   Interval Summary  .    More awake and alert today. Less short of breath. Well over last 24 hours Son at bedside  Vital Signs .    Vitals:   02/01/24 2010 02/02/24 0140 02/02/24 0607 02/02/24 0910  BP: (!) 129/56 126/71  114/63  Pulse: 99 76 80 77  Resp: 20 18 20 20   Temp: 98.1 F (36.7 C) 98.5 F (36.9 C)  98.6 F (37 C)  TempSrc: Oral Oral  Oral  SpO2: 95% 95% 100% 100%  Weight:   112.3 kg   Height:        Intake/Output Summary (Last 24 hours) at 02/02/2024 1117 Last data filed at 02/02/2024 0900 Gross per 24 hour  Intake 240 ml  Output 1000 ml  Net -760 ml      02/02/2024    6:07 AM 02/01/2024    4:30 AM 01/31/2024   10:30 AM  Last 3 Weights  Weight (lbs) 247 lb 9.2 oz 237 lb 10.5 oz 229 lb 8 oz  Weight (kg) 112.3 kg 107.8 kg 104.1 kg      Telemetry/ECG    Atrial fibrillation with controlled ventricular rate, PVCs, ventricular bigeminy, longest episode of NSVT 5 beats (asymptomatic)- Personally Reviewed  CV Studies    Echocardiogram  01/14/2023: LVEF 50%, regional wall motion abnormalities, see report for additional details  01/31/2024: Technically difficult study. No apical thrombus with Definity contrast. LVEF 35-40%. Global hypokinesis. Left ventricular mild to moderately dilated Diastolic dysfunction not reported due to A-fib. RV systolic function mildly reduced, size normal. Moderate left atrial dilatation. Mild MR, trivial AR, estimated RAP 15 mmHg.    Physical Exam .   GEN: No acute distress.   Neck: + JVD, V waves Cardiac: Irregularly irregular no murmurs Respiratory: Clear to auscultation bilaterally. GI: Soft, nontender, non-distended  MS:  Bilaterally wrapped legs, cellulitis, lower extremity swelling is improved, erythema improving.    Patient Profile    [name] GRACIANO is a 83 y.o. female has hx of CAD status post  CABG 2009, ischemic cardiomyopathy with HFrEF, persistent atrial fibrillation, CVA May 2017, CKD stage IV, diabetes, GI bleed 2018, OSA.  Currently patient admitted for hyperkalemia and sepsis secondary to cellulitis.  Cardiology asked to see due to CHF history  Assessment & Plan .     Ischemic cardiomyopathy with heart failure with reduced EF March 2024 echo 50% with mildly reduced RV function, RVSP 47.3.   Echo from this admission notes LVEF 35-40%, RV function mildly reduced, estimated RAP 15 mmHg, see report for additional details  Net IO Since Admission: -1,496.32 mL [02/02/24 1117] BNP 145 on arrival  GDMT has been held secondary to renal function and hyperkalemia. She is volume overload on physical examination as well as echocardiographic findings. Started Bumex 1 mg twice daily on 02/02/2024 -responded well.  Continue diuresis for now Strict I's and O's and daily weights. Uptitrate GDMT as hemodynamics and laboratory values allow.  Keeping consideration given her episode of hyperkalemia. Avoid SGLT2 inhibitors secondary to her urinary incontinence and history of UTI Son is requesting evaluation for possible CardioMEMS implantation.  Informed him that this is an outpatient procedure and given her clinical presentation with bilateral lower extremity cellulitis and UTI it is not the ideal time.  They will discuss further when they follow up with Dr. Freida Busman. Will continue to follow the patient with you.  I  suspect she can be transitioned to p.o. diuretics in the next 24 to 48 hours.  Hyperkalemia Present on arrival, now resolved Was on spironolactone, torsemide, and potassium supplements Has received Lokelma. Morning potassium is 4.0 now, on presentation it was >7.5 Monitor with BMP  Sepsis secondary to cellulitis Antibiotics per primary team.  CAD status post CABG 2009 Has history of reduced LVEF which improved to 50% as of echo from March 2024. Echocardiogram during current  hospitalization notes reduction in LVEF to 35-40%. Clinically denies anginal chest pain Conservative management for now unless change in clinical status. Will focus on diuresis for now. Not on antiplatelet therapies as she is on renally dosed Eliquis. Continue Lipitor 40 mg p.o. daily  Atrial fibrillation Likely longstanding persistent  Ventricular rate is well-controlled. Rate control: N/A. Rhythm control: N/A. Thromboembolic prophylaxis: Eliquis 2.5 mg p.o. twice daily. Continue telemetry. Monitor for now.  Premature ventricular contractions: Asymptomatic NSVT: Overall congestion is improving, no longer acutely decompensated. Will start low-dose Toprol-XL 25 mg p.o. daily and uptitrate as needed Clinically denies anginal chest pain. Continue Telemetry  LVEF had reduced compared to prior study, denies anginal chest pain, recommend outpatient cardiac PET/CT after being re-evaluated by primary cardiologist.   Plan of care discussed with her son present at bedside as well as nursing staff.   For questions or updates, please contact [facility] Please consult [email or web address] for contact info under     Tessa Lerner, DO, [facility]  [facility]  [address] [place], [place] [address] 11:17 AM 02/02/24

## 2024-02-02 NOTE — Assessment & Plan Note (Signed)
 CAD - s/p CABG in 2009 with 2 stents, continue atorvastatin 40 mg daily CKD IV - Monitor Cr Hypertension - See CHF above A-fib - Continue Eliquis 2.5 mg BID (renally dosed), cardiac telemetry Restless leg syndrome - Continue Requip 1 mg at bedtime  Intertrigo - Continue nystatin powder to gluteal folds Hx CVA - Continue Eliquis OSA - Does not use CPAP

## 2024-02-02 NOTE — Progress Notes (Signed)
 Daily Progress Note Intern Pager: [phone]  Patient name: [name] Medical record number: [id number] Date of birth: October 08, 1941 Age: 83 y.o. Gender: female  Primary Care Provider: Evette Georges, MD Consultants: Cardiology (HF) Code Status: DNR with prearrest interventions  Pt Overview and Major Events to Date:  3/20 - Admitted, vancomycin, CTX started, hyperkalemic 3/21 - CHF exacerbation s/p fluids, began diuresis 3/22 - Discontinue vancomycin 3/23 - Discontinue CTX, switch to   Assessment and Plan: [name] is a 83 y.o. female with a pertinent PMH of CAD, A-fib on Eliquis, CVA, HFpEF, CKD, T2DM, GERD, HTN, OSA who presented with generalized weakness and was admitted for suspected sepsis ISO RLE cellulitis and notable hyperkalemia.  Heart failure exacerbated due to fluids in setting of hyperkalemia and hypotension and now undergoing diuresis per cardiology.  Currently covering for cellulitis, E. coli, and P. mirabilis.  Will be stable for discharge once euvolemic per Cards diuresis plan. Assessment & Plan Cellulitis of right leg Admitted for sepsis contributing to cellulitis of right leg.  Patient has remained afebrile and leukocytosis improving. -Wound care consult, appreciate assistance -Discontinue CTX, replace with cephalexin 500 mg BID for 7 day course total (3/20-3/26) -Tylenol 650 mg Q6h PRN -Follow Bcx, NGTD @ 2 days -AM CBC, BMP CHF (congestive heart failure) (HCC) Suspected CHF exacerbation secondary to IV fluid treatment given hyperkalemia on initial presentation.  Echo this admission with EF of 35-40% (previously 50% on March 2024) with global hypokinesis of the LV.  Diuresis per cardiology. -Holding home spironolactone, carvedilol, torsemide -Strict I/Os, daily weights -Caution with fluid resuscitation -AM BMP, Mag -HF consulted, follow up recommendations:  -Restarted bumetanide 1 mg BID  -Transition to PO diuretics within 24-48 hours NSVT  (nonsustained ventricular tachycardia) (HCC) Asymptomatic with intermittent PVCs on tele. -Appreciate Cardiology recs:  -Start low-dose metoprolol succinate 25 mg PO daily Lower urinary tract infectious disease Urine cultures growing pansensitive E. coli, and Proteus sensitive to CTX. -PureWick ordered -Discontinue CTX, replace with cephalexin 500 mg BID for 7 day course total (3/20-3/26) -Strict I&Os Generalized weakness Remains A&Ox4.  Suspect hypotension/weakness in setting of cellulitis/UTI.  Concerning nighttime wakefulness and daytime sleep, high risk for delirium. -Continue to monitor for confusion closely -Delirium precautions -Add melatonin 3 mg QHS -Fall precautions -PT/OT Type 2 diabetes mellitus (HCC) A1c 7.8 this admission.  Home med includes weekly Trulicity.  CBG is elevated here we will consider changing to sensitive SSI -Switched to sensitive sliding scale -CBGs ACHS Hyperkalemia (Resolved: 02/02/2024) Resolved. -Appreciate CCM consult and initial assistance -Caution with spironolactone if resuming outpatient Chronic health problem CAD - s/p CABG in 2009 with 2 stents, continue atorvastatin 40 mg daily CKD IV - Monitor Cr Hypertension - See CHF above A-fib - Continue Eliquis 2.5 mg BID (renally dosed), cardiac telemetry Restless leg syndrome - Continue Requip 1 mg at bedtime  Intertrigo - Continue nystatin powder to gluteal folds Hx CVA - Continue Eliquis OSA - Does not use CPAP  FEN/GI: Carb modified, heart healthy PPx: Home Eliquis 2.5 mg twice daily renal dosing Dispo: Return to [facility] pending diuresis, adequate antibiotic coverage.  Subjective:  Patient is doing well this morning, son at bedside.  Do note that patient is not sleeping at night and sleeping during the day, no concern for delirium yet based on behavior however.  Son request patient is moved up in bed.  Objective: Temp:  [97.8 F (36.6 C)-98.5 F (36.9 C)] 98.5 F (36.9 C) (03/23  0140)  Pulse Rate:  [39-99] 80 (03/23 0607) Resp:  [18-20] 20 (03/23 0607) BP: (116-131)/(54-71) 126/71 (03/23 0140) SpO2:  [93 %-100 %] 100 % (03/23 0607) Weight:  [112.3 kg] 112.3 kg (03/23 1610)  Physical Exam: General: Elderly, resting comfortably in bed, NAD, A&Ox4 Cardiovascular: JVD 3 cm below angle of jaw.  Irregular rate and rhythm. Normal S1/S2. No murmurs, rubs, or gallops appreciated. 2+ radial pulses. Pulmonary: Mild bibasilar crackles.  No wheezes or rhonchi. Normal WOB on room air. Abdominal: Normoactive bowel sounds, nondistended. No tenderness to deep or light palpation. No rebound or guarding. No HSM. Skin: Warm and dry. Extremities: 1+ pitting peripheral edema bilaterally. Capillary refill <2 seconds.  Laboratory: Most recent CBC Lab Results  Component Value Date   WBC 8.9 02/02/2024   HGB 8.9 (L) 02/02/2024   HCT 27.5 (L) 02/02/2024   MCV 89.6 02/02/2024   PLT 174 02/02/2024   Most recent BMP    Latest Ref Rng & Units 02/02/2024    3:21 AM  BMP  Glucose 70 - 99 mg/dL 960   BUN 8 - 23 mg/dL 51   Creatinine 4.54 - 1.00 mg/dL 0.98   Sodium 119 - 147 mmol/L 134   Potassium 3.5 - 5.1 mmol/L 4.0   Chloride 98 - 111 mmol/L 110   CO2 22 - 32 mmol/L 18   Calcium 8.9 - 10.3 mg/dL 9.2     Other pertinent labs: -Urine cultures with sensitivities pending  New Imaging/Diagnostic Tests: -None  Govani Radloff, MD 02/02/2024, 7:20 AM  PGY-1, [facility] FPTS Intern pager: [phone], text pages welcome Secure chat group [facility] Teaching Service

## 2024-02-02 NOTE — Assessment & Plan Note (Addendum)
 Urine cultures growing pansensitive E. coli, and Proteus sensitive to CTX. -PureWick ordered -Discontinue CTX, replace with cephalexin 500 mg BID for 7 day course total (3/20-3/26) -Strict I&Os

## 2024-02-02 NOTE — Discharge Instructions (Signed)
 Dear [name],   Thank you for letting us participate in your care! We are so glad you are doing better. You were admitted for weakness and found to have a UTI, an infection to your right leg, and high potassium levels. You received antibiotics for your infections, and medications to help decrease your potassium to normal levels. It is very important that you take your medications as prescribed once you leave the hospital.   POST-HOSPITAL & CARE INSTRUCTIONS We recommend following up with your PCP within 1 week from being discharged from the hospital. Please let PCP/Specialists know of any changes in medications that were made which you will be able to see in the medications section of this packet. Please also follow up with your other appointments as below:  DOCTOR'S APPOINTMENTS & FOLLOW UP Future Appointments  Date Time Provider Department Center  02/03/2024  2:50 PM Elberta Fortis, MD [facility]  02/19/2024  9:45 AM McCaughan, Dia D, DPM [facility]  05/07/2024 10:40 AM [facility] ANNUAL WELLNESS VISIT [facility]     Thank you for choosing [facility]! Take care and be well!  Family Medicine Teaching Service Inpatient Team [place]  [facility]  [address] [place], [place] [address] [phone]

## 2024-02-02 NOTE — Assessment & Plan Note (Addendum)
 Resolved. -Appreciate CCM consult and initial assistance -Caution with spironolactone if resuming outpatient

## 2024-02-02 NOTE — Assessment & Plan Note (Addendum)
 Suspected CHF exacerbation secondary to IV fluid treatment given hyperkalemia on initial presentation.  Echo this admission with EF of 35-40% (previously 50% on March 2024) with global hypokinesis of the LV.  Diuresis per cardiology. -Holding home spironolactone, carvedilol, torsemide -Strict I/Os, daily weights -Caution with fluid resuscitation -AM BMP, Mag -HF consulted, follow up recommendations:  -Restarted bumetanide 1 mg BID  -Transition to PO diuretics within 24-48 hours

## 2024-02-02 NOTE — Plan of Care (Signed)
  Problem: Education: Goal: Knowledge of General Education information will improve Description: Including pain rating scale, medication(s)/side effects and non-pharmacologic comfort measures Outcome: Progressing   Problem: Health Behavior/Discharge Planning: Goal: Ability to manage health-related needs will improve Outcome: Progressing   Problem: Clinical Measurements: Goal: Ability to maintain clinical measurements within normal limits will improve Outcome: Progressing Goal: Will remain free from infection Outcome: Progressing Goal: Diagnostic test results will improve Outcome: Progressing Goal: Respiratory complications will improve Outcome: Progressing Goal: Cardiovascular complication will be avoided Outcome: Progressing   Problem: Nutrition: Goal: Adequate nutrition will be maintained Outcome: Progressing   Problem: Coping: Goal: Level of anxiety will decrease Outcome: Progressing   Problem: Elimination: Goal: Will not experience complications related to urinary retention Outcome: Progressing   Problem: Pain Managment: Goal: General experience of comfort will improve and/or be controlled Outcome: Progressing   Problem: Safety: Goal: Ability to remain free from injury will improve Outcome: Progressing   Problem: Skin Integrity: Goal: Risk for impaired skin integrity will decrease Outcome: Progressing   Problem: Activity: Goal: Risk for activity intolerance will decrease Outcome: Not Progressing   Problem: Elimination: Goal: Will not experience complications related to bowel motility Outcome: Not Progressing

## 2024-02-02 NOTE — Assessment & Plan Note (Addendum)
 A1c 7.8 this admission.  Home med includes weekly Trulicity.  CBG is elevated here we will consider changing to sensitive SSI -Switched to sensitive sliding scale -CBGs ACHS

## 2024-02-02 NOTE — Assessment & Plan Note (Addendum)
 Admitted for sepsis contributing to cellulitis of right leg.  Patient has remained afebrile and leukocytosis improving. -Wound care consult, appreciate assistance -Discontinue CTX, replace with cephalexin 500 mg BID for 7 day course total (3/20-3/26) -Tylenol 650 mg Q6h PRN -Follow Bcx, NGTD @ 2 days -AM CBC, BMP

## 2024-02-03 ENCOUNTER — Ambulatory Visit: Admitting: Family Medicine

## 2024-02-03 DIAGNOSIS — R531 Weakness: Secondary | ICD-10-CM | POA: Diagnosis not present

## 2024-02-03 DIAGNOSIS — A419 Sepsis, unspecified organism: Secondary | ICD-10-CM | POA: Diagnosis not present

## 2024-02-03 DIAGNOSIS — I5043 Acute on chronic combined systolic (congestive) and diastolic (congestive) heart failure: Secondary | ICD-10-CM | POA: Diagnosis not present

## 2024-02-03 DIAGNOSIS — L8990 Pressure ulcer of unspecified site, unspecified stage: Secondary | ICD-10-CM | POA: Insufficient documentation

## 2024-02-03 DIAGNOSIS — N390 Urinary tract infection, site not specified: Secondary | ICD-10-CM

## 2024-02-03 DIAGNOSIS — I509 Heart failure, unspecified: Secondary | ICD-10-CM

## 2024-02-03 DIAGNOSIS — E1122 Type 2 diabetes mellitus with diabetic chronic kidney disease: Secondary | ICD-10-CM

## 2024-02-03 DIAGNOSIS — E875 Hyperkalemia: Secondary | ICD-10-CM

## 2024-02-03 DIAGNOSIS — N184 Chronic kidney disease, stage 4 (severe): Secondary | ICD-10-CM | POA: Diagnosis not present

## 2024-02-03 DIAGNOSIS — R600 Localized edema: Secondary | ICD-10-CM

## 2024-02-03 LAB — GLUCOSE, CAPILLARY
Glucose-Capillary: 238 mg/dL — ABNORMAL HIGH (ref 70–99)
Glucose-Capillary: 262 mg/dL — ABNORMAL HIGH (ref 70–99)
Glucose-Capillary: 277 mg/dL — ABNORMAL HIGH (ref 70–99)
Glucose-Capillary: 239 mg/dL — ABNORMAL HIGH (ref 70–99)

## 2024-02-03 LAB — CULTURE, BLOOD (ROUTINE X 2)
Culture: NO GROWTH
Culture: NO GROWTH
Special Requests: ADEQUATE

## 2024-02-03 LAB — BASIC METABOLIC PANEL
Anion gap: 9 (ref 5–15)
BUN: 57 mg/dL — ABNORMAL HIGH (ref 8–23)
CO2: 17 mmol/L — ABNORMAL LOW (ref 22–32)
Calcium: 9.4 mg/dL (ref 8.9–10.3)
Chloride: 107 mmol/L (ref 98–111)
Creatinine, Ser: 1.86 mg/dL — ABNORMAL HIGH (ref 0.44–1.00)
GFR, Estimated: 26 mL/min — ABNORMAL LOW (ref >60)
Glucose, Bld: 247 mg/dL — ABNORMAL HIGH (ref 70–99)
Potassium: 4.1 mmol/L (ref 3.5–5.1)
Sodium: 133 mmol/L — ABNORMAL LOW (ref 135–145)

## 2024-02-03 LAB — MAGNESIUM: Magnesium: 1.9 mg/dL (ref 1.7–2.4)

## 2024-02-03 LAB — CBC
HCT: 27.1 % — ABNORMAL LOW (ref 36.0–46.0)
Hemoglobin: 8.7 g/dL — ABNORMAL LOW (ref 12.0–15.0)
MCH: 28.6 pg (ref 26.0–34.0)
MCHC: 32.1 g/dL (ref 30.0–36.0)
MCV: 89.1 fL (ref 80.0–100.0)
Platelets: 182 10*3/uL (ref 150–400)
RBC: 3.04 MIL/uL — ABNORMAL LOW (ref 3.87–5.11)
RDW: 17.3 % — ABNORMAL HIGH (ref 11.5–15.5)
WBC: 6.3 10*3/uL (ref 4.0–10.5)
nRBC: 0 % (ref 0.0–0.2)

## 2024-02-03 MED ORDER — POLYETHYLENE GLYCOL 3350 17 G PO PACK
17.0000 g | PACK | Freq: Every day | ORAL | Status: DC
  Administered 2024-02-03 – 2024-02-05 (×2): 17 g via ORAL
  Filled 2024-02-03 (×3): qty 1

## 2024-02-03 MED ORDER — MAGNESIUM SULFATE 2 GM/50ML IV SOLN
2.0000 g | Freq: Once | INTRAVENOUS | Status: AC
  Administered 2024-02-03: 2 g via INTRAVENOUS
  Filled 2024-02-03: qty 50

## 2024-02-03 MED ORDER — INSULIN GLARGINE-YFGN 100 UNIT/ML ~~LOC~~ SOLN
8.0000 [IU] | Freq: Every day | SUBCUTANEOUS | Status: DC
  Administered 2024-02-03 – 2024-02-04 (×2): 8 [IU] via SUBCUTANEOUS
  Filled 2024-02-03 (×2): qty 0.08

## 2024-02-03 NOTE — Assessment & Plan Note (Addendum)
 Admitted for sepsis contributing to cellulitis of right leg.  Patient has remained afebrile and leukocytosis improving. -Wound care consult, appreciate assistance -Cephalexin 500 mg BID for 7 day course total (3/20-3/26) -Tylenol 650 mg Q6h PRN -Follow Bcx, NGTD @ 2 days -AM CBC, BMP

## 2024-02-03 NOTE — Inpatient Diabetes Management (Signed)
 Inpatient Diabetes Program Recommendations  AACE/ADA: New Consensus Statement on Inpatient Glycemic Control (2015)  Target Ranges:  Prepandial:   less than 140 mg/dL      Peak postprandial:   less than 180 mg/dL (1-2 hours)      Critically ill patients:  140 - 180 mg/dL   Lab Results  Component Value Date   GLUCAP 238 (H) 02/03/2024   HGBA1C 7.8 (H) 01/30/2024    Review of Glycemic Control  Latest Reference Range & Units 02/02/24 11:37 02/02/24 15:47 02/02/24 21:06 02/03/24 05:57  Glucose-Capillary 70 - 99 mg/dL 284 (H) 132 (H) 440 (H) 238 (H)   Diabetes history: DM  Outpatient Diabetes medications:  Trulicity 1.5 mg weekly Current orders for Inpatient glycemic control:  Novolog 0-9 units tid with meals  Inpatient Diabetes Program Recommendations:    While in the hospital, consider adding basal insulin.  Consider adding Lantus 12 units daily.   Thanks,  Lorenza Cambridge, RN, BC-ADM Inpatient Diabetes Coordinator Pager [phone]  (8a-5p)

## 2024-02-03 NOTE — Progress Notes (Signed)
 Daily Progress Note Intern Pager: [phone]  Patient name: [name] Medical record number: [id number] Date of birth: 28-Nov-1940 Age: 83 y.o. Gender: female  Primary Care Provider: Evette Georges, MD Consultants: Cardiology (HF) Code Status: DNR limited  Pt Overview and Major Events to Date:  3/20 - Admitted, vancomycin, CTX started, hyperkalemic 3/21 - CHF exacerbation s/p fluids, began diuresis 3/22 - Discontinue vancomycin 3/23 - Discontinue CTX, switch to    Assessment and Plan: [name] is a 83 y.o. female with a pertinent PMH of CAD, Afib on Eliquis, CVA, T2DM, and HTN who presented with generalized weakness and was admitted for ?sepsis ISO RLE cellulitis and UTI.  Was found to be hypotensive and significantly hyperkalemic, but for which required IV fluids.  Became fluid overloaded and entered heart failure exacerbation.  Currently undergoing diuresis per Cardiology.  24 hour UOP of 1 L, net negative 2.8 L this admission. Weight stable at 112.3 kg. K and Mag replenished.  Creatinine stable. Assessment & Plan CHF (congestive heart failure) (HCC) Suspected CHF exacerbation secondary to IV fluid treatment given hyperkalemia on initial presentation.  Echo this admission with EF of 35-40% (previously 50% on March 2024) with global hypokinesis of the LV.  Diuresis per cardiology. -Holding home spironolactone, carvedilol, torsemide -Strict I/Os, daily weights -Caution with fluid resuscitation -AM BMP, Mag -HF consulted, follow up recommendations:  -Restarted bumetanide 1 mg BID  -Transition to PO diuretics within 24-48 hours Cellulitis of right leg Admitted for sepsis contributing to cellulitis of right leg.  Patient has remained afebrile and leukocytosis improving. -Wound care consult, appreciate assistance -Cephalexin 500 mg BID for 7 day course total (3/20-3/26) -Tylenol 650 mg Q6h PRN -Follow Bcx, NGTD @ 2 days -AM CBC, BMP NSVT (nonsustained ventricular  tachycardia) (HCC) Asymptomatic with intermittent PVCs on tele. -Appreciate Cardiology recs:  -Start low-dose metoprolol succinate 25 mg PO daily Lower urinary tract infectious disease Urine cultures growing pansensitive E. coli, and Proteus sensitive to CTX. -PureWick ordered -Cephalexin 500 mg BID for 7 day course total (3/20-3/26) -Strict I&Os Type 2 diabetes mellitus (HCC) A1c 7.8 this admission.  Home regimen: Weekly Trulicity.  CBGs ranging in 200s, fasting 247, 15 units SAI past 24 hours. -Sensitive SSI -Start LAI 8 units -CBGs ACHS Generalized weakness Remains A&Ox4.  Suspect hypotension/weakness in setting of cellulitis/UTI.  Concerning nighttime wakefulness and daytime sleep, high risk for delirium. -Continue to monitor for confusion closely -Delirium precautions -Add melatonin 3 mg QHS -Fall precautions -PT/OT Chronic health problem CAD - s/p CABG in 2009 with 2 stents, continue atorvastatin 40 mg daily CKD IV - Monitor Cr Hypertension - See CHF above A-fib - Continue Eliquis 2.5 mg BID (renally dosed), cardiac telemetry Restless leg syndrome - Continue Requip 1 mg at bedtime  Intertrigo - Continue nystatin powder to gluteal folds Hx CVA - Continue Eliquis OSA - Does not use CPAP  FEN/GI: Carb modified, heart healthy PPx: Eliquis 2.5 mg BID Dispo: [facility] SNF pending diuresis.  Subjective:  Patient states she feels well this morning.  Denies chest pain and dyspnea.  Son is at bedside.  Objective: Temp:  [98.1 F (36.7 C)-98.6 F (37 C)] 98.1 F (36.7 C) (03/24 0337) Pulse Rate:  [74-81] 74 (03/24 0337) Resp:  [19-20] 19 (03/24 0337) BP: (103-123)/(49-97) 123/53 (03/24 0337) SpO2:  [98 %-100 %] 98 % (03/24 0337) Weight:  [112.3 kg] 112.3 kg (03/24 0337)  Physical Exam: General: Chronically ill appearing, resting comfortably in bed, NAD, alert and  at baseline. Cardiovascular: JVD 4-5 cm below the angle of the jaw. Irregular rate and rhythm. Normal S1/S2. No  murmurs, rubs, or gallops appreciated. 2+ radial pulses. Pulmonary: Clear bilaterally to ascultation. No wheezes, crackles, or rhonchi. Normal WOB on room air, no accessory muscle usage. Abdominal: Normoactive bowel sounds, nondistended. No tenderness to deep or light palpation. No rebound or guarding. Skin: Warm and dry. Extremities: 1+ peripheral edema bilaterally. Capillary refill <2 seconds.  Laboratory: Most recent CBC Lab Results  Component Value Date   WBC 6.3 02/03/2024   HGB 8.7 (L) 02/03/2024   HCT 27.1 (L) 02/03/2024   MCV 89.1 02/03/2024   PLT 182 02/03/2024   Most recent BMP    Latest Ref Rng & Units 02/03/2024    3:51 AM  BMP  Glucose 70 - 99 mg/dL 161   BUN 8 - 23 mg/dL 57   Creatinine 0.96 - 1.00 mg/dL 0.45   Sodium 409 - 811 mmol/L 133   Potassium 3.5 - 5.1 mmol/L 4.1   Chloride 98 - 111 mmol/L 107   CO2 22 - 32 mmol/L 17   Calcium 8.9 - 10.3 mg/dL 9.4     Other pertinent labs: -None  New Imaging/Diagnostic Tests: -None  Pegge Cumberledge, MD 02/03/2024, 8:00 AM  PGY-1, [place] [facility] FPTS Intern pager: [phone], text pages welcome Secure chat group [facility] Teaching Service

## 2024-02-03 NOTE — Progress Notes (Signed)
 Heart Failure Navigator Progress Note  Assessed for Heart & Vascular TOC clinic readiness.  Patient does not meet criteria due to Advanced Heart Failure Team patient of Dr. Shirlee Latch.   Navigator will sign off at this time.    Rhae Hammock, BSN, Scientist, clinical (histocompatibility and immunogenetics) Only

## 2024-02-03 NOTE — Assessment & Plan Note (Addendum)
 A1c 7.8 this admission.  Home regimen: Weekly Trulicity.  CBGs ranging in 200s, fasting 247, 15 units SAI past 24 hours. -Sensitive SSI -Start LAI 8 units -CBGs ACHS

## 2024-02-03 NOTE — Progress Notes (Addendum)
   Patient Name: [name] Date of Encounter: 02/03/2024 [facility] Cardiologist: Armanda Magic, MD   Interval Summary  .    Net -1.1 L yesterday, -2.8 L on admission.  BP 120/52.  Creatinine stable at 1.86.  Reports dyspnea improved but not at baseline  Vital Signs .    Vitals:   02/02/24 1928 02/02/24 2327 02/03/24 0337 02/03/24 0901  BP: (!) 115/57 (!) 103/49 (!) 123/53 (!) 120/52  Pulse: 81 74 74   Resp: 20 20 19 16   Temp: 98.6 F (37 C) 98.2 F (36.8 C) 98.1 F (36.7 C) 97.9 F (36.6 C)  TempSrc: Oral Oral Oral Oral  SpO2: 99% 100% 98%   Weight:   112.3 kg   Height:        Intake/Output Summary (Last 24 hours) at 02/03/2024 1126 Last data filed at 02/03/2024 0644 Gross per 24 hour  Intake 600 ml  Output 1900 ml  Net -1300 ml      02/03/2024    3:37 AM 02/02/2024    6:07 AM 02/01/2024    4:30 AM  Last 3 Weights  Weight (lbs) 247 lb 9.2 oz 247 lb 9.2 oz 237 lb 10.5 oz  Weight (kg) 112.3 kg 112.3 kg 107.8 kg      Telemetry/ECG    Atrial fibrillation with controlled ventricular rate, PVCs, ventricular bigeminy, longest episode of NSVT 5 beats (asymptomatic)- Personally Reviewed  CV Studies    Echocardiogram  01/14/2023: LVEF 50%, regional wall motion abnormalities, see report for additional details  01/31/2024: Technically difficult study. No apical thrombus with Definity contrast. LVEF 35-40%. Global hypokinesis. Left ventricular mild to moderately dilated Diastolic dysfunction not reported due to A-fib. RV systolic function mildly reduced, size normal. Moderate left atrial dilatation. Mild MR, trivial AR, estimated RAP 15 mmHg.    Physical Exam .   GEN: No acute distress.   Neck: + JVD Cardiac: Irregularly irregular no murmurs Respiratory: Diminished breath sounds GI: Soft, nontender, non-distended  MS:  Bilaterally wrapped legs, cellulitis, lower extremity swelling is improved, erythema improving.    Patient Profile    [name] is a 83 y.o. female has hx of CAD status post CABG 2009, ischemic cardiomyopathy with HFrEF, persistent atrial fibrillation, CVA May 2017, CKD stage IV, diabetes, GI bleed 2018, OSA. Currently patient admitted for hyperkalemia and sepsis secondary to cellulitis.  Cardiology asked to see due to CHF history  Assessment & Plan .     Acute on chronic combined heart failure: Echo this admission with EF 35 to 40%, mild RV dysfunction.  Presented with volume overload. -GDMT limited by renal function, hyperkalemia.  No SGLT2 given UTI history -Diuresing with Bumex 1 mg twice daily.  Remains volume overloaded, continue Bumex  Hyperkalemia: Resolved.  Spironolactone held.  Sepsis: Due to cellulitis.  On antibiotics per primary team  CAD: Status post CABG in 2009.  On Eliquis, statin  Atrial fibrillation: Rate controlled.  Continue Eliquis 2.5 mg twice daily (reduced dose given age, renal function)  CKD stage IV: Creatinine 1.7-1.9 this admission    Little Ishikawa, MD

## 2024-02-03 NOTE — Assessment & Plan Note (Signed)
 Suspected CHF exacerbation secondary to IV fluid treatment given hyperkalemia on initial presentation.  Echo this admission with EF of 35-40% (previously 50% on March 2024) with global hypokinesis of the LV.  Diuresis per cardiology. -Holding home spironolactone, carvedilol, torsemide -Strict I/Os, daily weights -Caution with fluid resuscitation -AM BMP, Mag -HF consulted, follow up recommendations:  -Restarted bumetanide 1 mg BID  -Transition to PO diuretics within 24-48 hours

## 2024-02-03 NOTE — Care Management Important Message (Signed)
 Important Message  Patient Details  Name: [name] MRN: [id number] Date of Birth: 04/22/1941   Important Message Given:  Yes - Medicare IM     Renie Ora 02/03/2024, 10:42 AM

## 2024-02-03 NOTE — Assessment & Plan Note (Addendum)
 Urine cultures growing pansensitive E. coli, and Proteus sensitive to CTX. -PureWick ordered -Cephalexin 500 mg BID for 7 day course total (3/20-3/26) -Strict I&Os

## 2024-02-03 NOTE — TOC Progression Note (Signed)
 Transition of Care [facility]) - Progression Note    Patient Details  Name: [name] MRN: [id number] Date of Birth: Apr 25, 1941  Transition of Care [facility]) CM/SW Contact  Eduard Roux, [place] Phone Number: 02/03/2024, 12:20 PM  Clinical Narrative:     [facility] declined bed offer- insurance is not in network  Patient has no bed offers at this time.  Antony Blackbird, MSW, LCSW Clinical Social Worker    Expected Discharge Plan: Skilled Nursing Facility Barriers to Discharge: Continued Medical Work up, SNF Pending bed offer, Insurance Authorization  Expected Discharge Plan and Services In-house Referral: Clinical Social Work     Living arrangements for the past 2 months: Single Family Home                                       Social Determinants of Health (SDOH) Interventions SDOH Screenings   Food Insecurity: No Food Insecurity (01/30/2024)  Housing: Low Risk  (01/30/2024)  Transportation Needs: No Transportation Needs (01/30/2024)  Utilities: Not At Risk (01/30/2024)  Alcohol Screen: Low Risk  (10/23/2023)  Depression (PHQ2-9): High Risk (01/29/2024)  Financial Resource Strain: Low Risk  (05/06/2023)  Physical Activity: Inactive (09/24/2023)  Social Connections: Socially Isolated (01/30/2024)  Stress: No Stress Concern Present (05/06/2023)  Tobacco Use: Low Risk  (01/30/2024)  Health Literacy: Adequate Health Literacy (09/24/2023)    Readmission Risk Interventions     No data to display

## 2024-02-03 NOTE — Assessment & Plan Note (Signed)
 Asymptomatic with intermittent PVCs on tele. -Appreciate Cardiology recs:  -Start low-dose metoprolol succinate 25 mg PO daily

## 2024-02-03 NOTE — Progress Notes (Signed)
 Physical Therapy Treatment Patient Details Name: [name] MRN: [id number] DOB: [birth year] Today's Date: 02/03/2024   History of Present Illness Pt is 83 yo presenting [facility] ED on 3/19 due to generalized weakness and BLE swelling. Pt with sepsis 2/2 cellulitis. PMH: CHF, CKD 4, DM II, diabetic neuropathy, GERD, CVA 2018, Afib on eliquis, OSA and thrombocytopenia, macular degeneration, CAD, venous stasis    PT Comments  Pt resting in bed on arrival and agreeable to session with steady progress towards acute goals. Pt continues to require increased assist, up to max A, to complete bed mobility as pt sleeps in lift chair at baseline and with decreased UE ROM limiting ability to utilize bed rails to self assist. Pt able to come to stand from elevated EOB with mod A and take slow low steps along EOB to HOB. Pt participating in LE exercise seated up EOB with fair tolerance. Pt continues to benefit from skilled PT services to progress toward functional mobility goals.      If plan is discharge home, recommend the following: A lot of help with walking and/or transfers;A lot of help with bathing/dressing/bathroom;Assist for transportation;Help with stairs or ramp for entrance;Assistance with cooking/housework   Can travel by private vehicle     No  Equipment Recommendations  None recommended by PT    Recommendations for Other Services       Precautions / Restrictions Precautions Precautions: Fall Restrictions Weight Bearing Restrictions Per Provider Order: No     Mobility  Bed Mobility Overal bed mobility: Needs Assistance Bed Mobility: Supine to Sit, Sit to Supine, Rolling     Supine to sit: Max assist Sit to supine: Max assist   General bed mobility comments: max A for all aspects    Transfers Overall transfer level: Needs assistance Equipment used: Rolling walker (2 wheels) Transfers: Sit to/from Stand Sit to Stand: From elevated surface, Mod assist           General  transfer comment: assist from elevated bed, simulating height of lift chair at home, mod A to boost to stand, able to take a few small steps along EOB to [facility]    Ambulation/Gait               General Gait Details: unable   Stairs             Wheelchair Mobility     Tilt Bed    Modified Rankin (Stroke Patients Only)       Balance Overall balance assessment: Needs assistance Sitting-balance support: Feet supported, Bilateral upper extremity supported Sitting balance-Leahy Scale: Fair Sitting balance - Comments: close guard   Standing balance support: Bilateral upper extremity supported Standing balance-Leahy Scale: Poor Standing balance comment: reliant on RW and external support                            Communication Communication Communication: No apparent difficulties  Cognition Arousal: Alert Behavior During Therapy: Anxious   PT - Cognitive impairments: No apparent impairments                       PT - Cognition Comments: fearful of falling Following commands: Impaired Following commands impaired: Follows one step commands with increased time    Cueing Cueing Techniques: Verbal cues, Gestural cues  Exercises General Exercises - Lower Extremity Hip Flexion/Marching: AROM, Right, Left, 10 reps, Seated    General Comments  Pertinent Vitals/Pain Pain Assessment Pain Assessment: Faces Faces Pain Scale: Hurts little more Pain Location: LEs Pain Descriptors / Indicators: Grimacing, Guarding, Discomfort Pain Intervention(s): Monitored during session, Limited activity within patient's tolerance    Home Living                          Prior Function            PT Goals (current goals can now be found in the care plan section) Acute Rehab PT Goals Patient Stated Goal: to get stronger PT Goal Formulation: With patient/family Time For Goal Achievement: 02/14/24 Progress towards PT goals: Progressing  toward goals    Frequency    Min 2X/week      PT Plan      Co-evaluation              AM-PAC PT "6 Clicks" Mobility   Outcome Measure  Help needed turning from your back to your side while in a flat bed without using bedrails?: Total Help needed moving from lying on your back to sitting on the side of a flat bed without using bedrails?: Total Help needed moving to and from a bed to a chair (including a wheelchair)?: Total Help needed standing up from a chair using your arms (e.g., wheelchair or bedside chair)?: Total Help needed to walk in hospital room?: Total Help needed climbing 3-5 steps with a railing? : Total 6 Click Score: 6    End of Session Equipment Utilized During Treatment: Gait belt Activity Tolerance: Patient tolerated treatment well Patient left: in bed;with call bell/phone within reach;with bed alarm set Nurse Communication: Mobility status PT Visit Diagnosis: Unsteadiness on feet (R26.81);Other abnormalities of gait and mobility (R26.89);Muscle weakness (generalized) (M62.81)     Time: 9604-5409 PT Time Calculation (min) (ACUTE ONLY): 18 min  Charges:    $Therapeutic Activity: 8-22 mins PT General Charges $$ ACUTE PT VISIT: 1 Visit                     Antonie Borjon R. PTA Acute Rehabilitation Services Office: [phone]   Catalina Antigua 02/03/2024, 4:19 PM

## 2024-02-03 NOTE — Assessment & Plan Note (Signed)
 Remains A&Ox4.  Suspect hypotension/weakness in setting of cellulitis/UTI.  Concerning nighttime wakefulness and daytime sleep, high risk for delirium. -Continue to monitor for confusion closely -Delirium precautions -Add melatonin 3 mg QHS -Fall precautions -PT/OT

## 2024-02-03 NOTE — Assessment & Plan Note (Signed)
 CAD - s/p CABG in 2009 with 2 stents, continue atorvastatin 40 mg daily CKD IV - Monitor Cr Hypertension - See CHF above A-fib - Continue Eliquis 2.5 mg BID (renally dosed), cardiac telemetry Restless leg syndrome - Continue Requip 1 mg at bedtime  Intertrigo - Continue nystatin powder to gluteal folds Hx CVA - Continue Eliquis OSA - Does not use CPAP

## 2024-02-04 DIAGNOSIS — I509 Heart failure, unspecified: Secondary | ICD-10-CM | POA: Diagnosis not present

## 2024-02-04 DIAGNOSIS — N390 Urinary tract infection, site not specified: Secondary | ICD-10-CM | POA: Diagnosis not present

## 2024-02-04 DIAGNOSIS — I5043 Acute on chronic combined systolic (congestive) and diastolic (congestive) heart failure: Secondary | ICD-10-CM | POA: Diagnosis not present

## 2024-02-04 DIAGNOSIS — N184 Chronic kidney disease, stage 4 (severe): Secondary | ICD-10-CM | POA: Diagnosis not present

## 2024-02-04 DIAGNOSIS — A419 Sepsis, unspecified organism: Secondary | ICD-10-CM | POA: Diagnosis not present

## 2024-02-04 DIAGNOSIS — R531 Weakness: Secondary | ICD-10-CM | POA: Diagnosis not present

## 2024-02-04 LAB — GLUCOSE, CAPILLARY
Glucose-Capillary: 204 mg/dL — ABNORMAL HIGH (ref 70–99)
Glucose-Capillary: 206 mg/dL — ABNORMAL HIGH (ref 70–99)
Glucose-Capillary: 215 mg/dL — ABNORMAL HIGH (ref 70–99)
Glucose-Capillary: 250 mg/dL — ABNORMAL HIGH (ref 70–99)

## 2024-02-04 LAB — BASIC METABOLIC PANEL
Anion gap: 7 (ref 5–15)
BUN: 57 mg/dL — ABNORMAL HIGH (ref 8–23)
CO2: 18 mmol/L — ABNORMAL LOW (ref 22–32)
Calcium: 9.1 mg/dL (ref 8.9–10.3)
Chloride: 108 mmol/L (ref 98–111)
Creatinine, Ser: 1.78 mg/dL — ABNORMAL HIGH (ref 0.44–1.00)
GFR, Estimated: 28 mL/min — ABNORMAL LOW (ref >60)
Glucose, Bld: 221 mg/dL — ABNORMAL HIGH (ref 70–99)
Potassium: 4.2 mmol/L (ref 3.5–5.1)
Sodium: 133 mmol/L — ABNORMAL LOW (ref 135–145)

## 2024-02-04 LAB — MAGNESIUM: Magnesium: 2.1 mg/dL (ref 1.7–2.4)

## 2024-02-04 MED ORDER — ZINC OXIDE 12.8 % EX OINT
TOPICAL_OINTMENT | Freq: Every day | CUTANEOUS | Status: DC
  Administered 2024-02-07: 1 via TOPICAL
  Filled 2024-02-04: qty 56.7

## 2024-02-04 MED ORDER — ACETAMINOPHEN 500 MG PO TABS
1000.0000 mg | ORAL_TABLET | Freq: Four times a day (QID) | ORAL | Status: DC
  Administered 2024-02-04 – 2024-02-07 (×11): 1000 mg via ORAL
  Filled 2024-02-04 (×12): qty 2

## 2024-02-04 MED ORDER — INSULIN GLARGINE-YFGN 100 UNIT/ML ~~LOC~~ SOLN
4.0000 [IU] | Freq: Once | SUBCUTANEOUS | Status: AC
  Administered 2024-02-04: 4 [IU] via SUBCUTANEOUS
  Filled 2024-02-04: qty 0.04

## 2024-02-04 MED ORDER — FUROSEMIDE 10 MG/ML IJ SOLN
80.0000 mg | Freq: Two times a day (BID) | INTRAMUSCULAR | Status: DC
  Administered 2024-02-04 – 2024-02-06 (×4): 80 mg via INTRAVENOUS
  Filled 2024-02-04 (×4): qty 8

## 2024-02-04 MED ORDER — INSULIN GLARGINE-YFGN 100 UNIT/ML ~~LOC~~ SOLN
12.0000 [IU] | Freq: Every day | SUBCUTANEOUS | Status: DC
  Administered 2024-02-05: 12 [IU] via SUBCUTANEOUS
  Filled 2024-02-04: qty 0.12

## 2024-02-04 NOTE — Plan of Care (Signed)
  Problem: Fluid Volume: Goal: Ability to maintain a balanced intake and output will improve Outcome: Not Progressing   Problem: Skin Integrity: Goal: Risk for impaired skin integrity will decrease Outcome: Not Progressing   Problem: Health Behavior/Discharge Planning: Goal: Ability to manage health-related needs will improve Outcome: Not Progressing

## 2024-02-04 NOTE — Inpatient Diabetes Management (Signed)
 Inpatient Diabetes Program Recommendations  AACE/ADA: New Consensus Statement on Inpatient Glycemic Control (2015)  Target Ranges:  Prepandial:   less than 140 mg/dL      Peak postprandial:   less than 180 mg/dL (1-2 hours)      Critically ill patients:  140 - 180 mg/dL   Lab Results  Component Value Date   GLUCAP 206 (H) 02/04/2024   HGBA1C 7.8 (H) 01/30/2024    Review of Glycemic Control  Latest Reference Range & Units 02/02/24 21:06 02/03/24 05:57 02/03/24 11:34 02/03/24 16:28 02/03/24 20:57 02/04/24 05:51 02/04/24 11:31  Glucose-Capillary 70 - 99 mg/dL 161 (H) 096 (H) 045 (H) 277 (H) 239 (H) 204 (H) 206 (H)   Diabetes history: DM  Outpatient Diabetes medications:  Trulicity 1.5 mg weekly Current orders for Inpatient glycemic control:  Novolog 0-9 units tid with meals Lantus 8 units daily Inpatient Diabetes Program Recommendations:    Consider increasing Lantus to 12 units daily.   Thanks,  Lorenza Cambridge, RN, BC-ADM Inpatient Diabetes Coordinator Pager [phone]  (8a-5p)

## 2024-02-04 NOTE — Assessment & Plan Note (Signed)
 CAD - s/p CABG in 2009 with 2 stents, continue atorvastatin 40 mg daily CKD IV - Monitor Cr Hypertension - See CHF above A-fib - Continue Eliquis 2.5 mg BID (renally dosed), cardiac telemetry Restless leg syndrome - Continue Requip 1 mg at bedtime  Intertrigo - Continue nystatin powder to gluteal folds Hx CVA - Continue Eliquis OSA - Does not use CPAP

## 2024-02-04 NOTE — Assessment & Plan Note (Addendum)
 Admitted for sepsis contributing to cellulitis of right leg.  Patient has remained afebrile and leukocytosis improving. -Wound care consult, appreciate assistance -Cephalexin 500 mg BID for 7 day course total (3/20-3/26) -Tylenol 650 mg Q6h PRN -Follow Bcx, NGTD -AM CBC, BMP

## 2024-02-04 NOTE — Progress Notes (Addendum)
 Patient Name: [name] Date of Encounter: 02/04/2024 [facility] Cardiologist: Armanda Magic, MD   Interval Summary  .     83 y.o. female has hx of CAD status post CABG 2009, ischemic cardiomyopathy with HFrEF, persistent atrial fibrillation, CVA May 2017, CKD stage IV, type 2 diabetes, GI bleed 2018, OSA, who is admitted for sepsis 2/2 leg cellulitis and UTI, hyperkalemia, received fluid resuscitation . Cardiology now following for acute HFrEF.   Patient states her SOB is improving today, she has not been OOB much, denied chest pain. She did not sleep too well.   Vital Signs .    Vitals:   02/03/24 1925 02/03/24 2245 02/04/24 0313 02/04/24 0816  BP: (!) 116/59 (!) 114/33  (!) 127/52  Pulse: 82 82  82  Resp: 20 20 (P) 20 18  Temp: 98 F (36.7 C) 97.9 F (36.6 C) (P) 98.3 F (36.8 C) 98.5 F (36.9 C)  TempSrc: Oral Oral (P) Oral Oral  SpO2: 100% 100%  100%  Weight:   (P) 111.5 kg   Height:        Intake/Output Summary (Last 24 hours) at 02/04/2024 0953 Last data filed at 02/04/2024 0810 Gross per 24 hour  Intake 240 ml  Output 1625 ml  Net -1385 ml      02/04/2024    3:13 AM 02/03/2024    3:37 AM 02/02/2024    6:07 AM  Last 3 Weights  Weight (lbs) 245 lb 13 oz 247 lb 9.2 oz 247 lb 9.2 oz  Weight (kg) 111.5 kg 112.3 kg 112.3 kg      Telemetry/ECG    A fib with VR 80s, NSVT up to 5 runs  - Personally Reviewed  Physical Exam .    GEN: No acute distress.   Neck: JVD up to jaw with HOB at 30 degree  Cardiac: Irregularly irregular, no murmurs, rubs, or gallops.  Respiratory: Clear but diminished auscultation bilaterally GI: Soft, nontender, non-distended  MS: BLE 1+ edema with wrap   Assessment & Plan .     Acute on chronic combined heart failure  Ischemic cardiomyopathy  - probably due to fluid resuscitation initially for sepsis  - Echo 01/2023 showed EF 50%, basal inferolateral hypokinesis, mildly decreased RV systolic function, mod LAE and RAE,  mild MR, mild to mod TR, mild AI, aortic sclerosis,  dilated IVC, PASP 47.3 mmHg - Echo from  01/31/24 showed LVEF down to 35-40%, global hypokinesis, mild reduced RV, mod LAE, mild MR, trivial AI, aortic sclerosis, IVC dilated  - CXR from 3/20 showed Mild diffuse pulmonary vascular congestion without frank pulmonary edema. - On PTA torsemide 100mg  BID, currently on PO Bumex 1mg  BID, UOP only over the past 24 hours, Net -4.1L, CKD IV stable with Cr around 1.8-2 range, clinically hypervolemic, recommend stop Bumex 1mg  BID, start IV lasix 80mg  BID, UOP not robust, will need add metolazone if remains low  - GDMT: spironolactone discontinued due to hyperkalemia, no SGLT2i given UTI, PTA coreg held likely due to low normal BP/OK, if BP recovers, would add back beta blocker; renal disease limiting GDMT.   CAD s/p CABG 2009 - no chest pain - Hold Coreg until BP trend, continue lipitor 40mg , not on ASA due to DOAC use   Persistent atrial fibrillation  - remains in A fib, rate controlled , off coreg, resume BB when able  - continue PTA Eliquis 2.5mg  BID, appopriate dose   UTI Cellulitis CKD IIIb DM  -  per primary team      For questions or updates, please contact [facility] Please consult [email or web address] for contact info under        Signed, Cyndi Bender, NP    Patient seen and examined.  Agree with above documentation.  On exam, patient is alert and oriented, regular rate and rhythm, no murmurs, diminished breath sounds, lower extremities wrapped, + JVD.  She mains volume overloaded, increase Lasix to IV 80 mg twice daily.  Little Ishikawa, MD

## 2024-02-04 NOTE — Assessment & Plan Note (Addendum)
 Suspected CHF exacerbation secondary to IV fluid treatment given hyperkalemia on initial presentation.  Echo this admission with EF of 35-40% (previously 50% on March 2024) with global hypokinesis of the LV.  Diuresis per cardiology. -Holding home spironolactone, carvedilol, torsemide -Strict I/Os, daily weights -Caution with fluid resuscitation -AM BMP, Mag -HF consulted, follow up recommendations:  -Stop bumetanide 1 mg BID, start Lasix 80 mg BID  -Transition to PO diuretics within 24-48 hours

## 2024-02-04 NOTE — Assessment & Plan Note (Addendum)
 Stage I sacral pressure ulcer. -Zinc paste daily and with BMs/dressing changes

## 2024-02-04 NOTE — Progress Notes (Addendum)
 Daily Progress Note Intern Pager: [phone]  Patient name: [name] Medical record number: [id number] Date of birth: [birth year] Age: 83 y.o. Gender: female  Primary Care Provider: Evette Georges, MD Consultants: Cardiology (HF) Code Status: DNR limited  Pt Overview and Major Events to Date:  3/20 - Admitted, vancomycin, CTX started, hyperkalemic 3/21 - CHF exacerbation s/p fluids, began diuresis 3/22 - Discontinue vancomycin 3/23 - Discontinue CTX, switch to cephalexin   Assessment and Plan: [name] is a 83 y.o. female with a pertinent PMH of CAD, A-fib on Eliquis, CVA, T2DM, and HTN who presented with transient weakness and was admitted for sepsis ISO RLE cellulitis and UTI, required IVF for hypotension and hyperkalemia and became fluid overloaded 2/2 CHF exacerbation.  Continues to diurese.  24 hour UOP of 700 L, net negative 3.5 L this admission. Weight not obtained today, will ensure daily weights. K and Mag stable.  Creatinine stable. Assessment & Plan CHF (congestive heart failure) (HCC) Suspected CHF exacerbation secondary to IV fluid treatment given hyperkalemia on initial presentation.  Echo this admission with EF of 35-40% (previously 50% on March 2024) with global hypokinesis of the LV.  Diuresis per cardiology. -Holding home spironolactone, carvedilol, torsemide -Strict I/Os, daily weights -Caution with fluid resuscitation -AM BMP, Mag -HF consulted, follow up recommendations:  -Stop bumetanide 1 mg BID, start Lasix 80 mg BID  -Transition to PO diuretics within 24-48 hours Cellulitis of right leg Admitted for sepsis contributing to cellulitis of right leg.  Patient has remained afebrile and leukocytosis improving. -Wound care consult, appreciate assistance -Cephalexin 500 mg BID for 7 day course total (3/20-3/26) -Tylenol 650 mg Q6h PRN -Follow Bcx, NGTD -AM CBC, BMP NSVT (nonsustained ventricular tachycardia) (HCC) Asymptomatic with intermittent  PVCs on tele. -Appreciate Cardiology recs:  -Start low-dose metoprolol succinate 25 mg PO daily Lower urinary tract infectious disease Urine cultures growing pansensitive E. coli, and Proteus sensitive to CTX. -PureWick ordered -Cephalexin 500 mg BID for cellulitis coverage as above -Strict I&Os Type 2 diabetes mellitus (HCC) A1c 7.8 this admission.  CBGs still ranging in 200s, fasting 204, 13 units SAI past 24 hours. Home regimen: Weekly Trulicity. -Sensitive SSI -Increase LAI to 12 units -CBGs ACHS Pressure injury of skin Stage I sacral pressure ulcer. -Zinc paste daily and with BMs/dressing changes Generalized weakness Remains A&Ox4.  Suspect hypotension/weakness in setting of cellulitis/UTI.  Concerning nighttime wakefulness and daytime sleep, high risk for delirium. -Continue to monitor for confusion closely -Delirium precautions -Add melatonin 3 mg QHS -Fall precautions -PT/OT Chronic health problem CAD - s/p CABG in 2009 with 2 stents, continue atorvastatin 40 mg daily CKD IV - Monitor Cr Hypertension - See CHF above A-fib - Continue Eliquis 2.5 mg BID (renally dosed), cardiac telemetry Restless leg syndrome - Continue Requip 1 mg at bedtime  Intertrigo - Continue nystatin powder to gluteal folds Hx CVA - Continue Eliquis OSA - Does not use CPAP  FEN/GI: Carb modified, heart healthy PPx: Eliquis 2.5 mg BID Dispo: [facility] pending placement.  Subjective:  Still not sleeping well and reports increased BLE pain overnight.  Grandkids visited, left card and flowers, which she is happy about.  Objective: Temp:  [97.9 F (36.6 C)-98.5 F (36.9 C)] (P) 98.3 F (36.8 C) (03/25 0313) Pulse Rate:  [74-82] 82 (03/24 2245) Resp:  [16-20] (P) 20 (03/25 0313) BP: (103-120)/(33-59) 114/33 (03/24 2245) SpO2:  [100 %] 100 % (03/24 2245) Weight:  [111.5 kg] (P) 111.5 kg (03/25 0313)  Physical Exam: General: Deconditioned, resting comfortably in bed, NAD, alert and at  baseline. Cardiovascular: JVP 4 cm below angle of jaw. Irregular rate and rhythm. Normal S1/S2. No murmurs, rubs, or gallops appreciated. 2+ radial pulses. Pulmonary: CTAB, no crackles.  No wheezes or rhonchi. Normal WOB on room air, no accessory muscle usage. Abdominal: Normoactive bowel sounds, nondistended. No tenderness to deep or light palpation. No rebound or guarding. Skin: Warm and dry. RLE cellulitis as below. Small stage I sacral pressure ulcer (photo below). Extremities: BLE wrapped for with gauze, no drainage.  RLE cellulitis with improving erythema relative to markings and improved edema.  Overall 1+ peripheral edema bilaterally at ankles, TTP. Capillary refill <2 seconds.  Laboratory: Most recent CBC Lab Results  Component Value Date   WBC 6.3 02/03/2024   HGB 8.7 (L) 02/03/2024   HCT 27.1 (L) 02/03/2024   MCV 89.1 02/03/2024   PLT 182 02/03/2024   Most recent BMP    Latest Ref Rng & Units 02/04/2024    3:29 AM  BMP  Glucose 70 - 99 mg/dL 086   BUN 8 - 23 mg/dL 57   Creatinine 5.78 - 1.00 mg/dL 4.69   Sodium 629 - 528 mmol/L 133   Potassium 3.5 - 5.1 mmol/L 4.2   Chloride 98 - 111 mmol/L 108   CO2 22 - 32 mmol/L 18   Calcium 8.9 - 10.3 mg/dL 9.1     Other pertinent labs: -None  New Imaging/Diagnostic Tests: -None  Nevin Kozuch, MD 02/04/2024, 8:08 AM  PGY-1, [facility] FPTS Intern pager: [phone], text pages welcome Secure chat group [facility] Teaching Service

## 2024-02-04 NOTE — Assessment & Plan Note (Signed)
 Remains A&Ox4.  Suspect hypotension/weakness in setting of cellulitis/UTI.  Concerning nighttime wakefulness and daytime sleep, high risk for delirium. -Continue to monitor for confusion closely -Delirium precautions -Add melatonin 3 mg QHS -Fall precautions -PT/OT

## 2024-02-04 NOTE — Assessment & Plan Note (Addendum)
 A1c 7.8 this admission.  CBGs still ranging in 200s, fasting 204, 13 units SAI past 24 hours. Home regimen: Weekly Trulicity. -Sensitive SSI -Increase LAI to 12 units -CBGs ACHS

## 2024-02-04 NOTE — TOC Progression Note (Signed)
 Transition of Care [facility]) - Progression Note    Patient Details  Name: [name] MRN: [id number] Date of Birth: [birth year]  Transition of Care [facility]) CM/SW Contact  Eduard Roux, [place] Phone Number: 02/04/2024, 4:36 PM  Clinical Narrative:     Patient has no bed offers at this time- CSW sent out more referrals   Antony Blackbird, MSW, LCSW Clinical Social Worker    Expected Discharge Plan: Skilled Nursing Facility Barriers to Discharge: Continued Medical Work up, SNF Pending bed offer, Insurance Authorization  Expected Discharge Plan and Services In-house Referral: Clinical Social Work     Living arrangements for the past 2 months: Single Family Home                                       Social Determinants of Health (SDOH) Interventions SDOH Screenings   Food Insecurity: No Food Insecurity (01/30/2024)  Housing: Low Risk  (01/30/2024)  Transportation Needs: No Transportation Needs (01/30/2024)  Utilities: Not At Risk (01/30/2024)  Alcohol Screen: Low Risk  (10/23/2023)  Depression (PHQ2-9): High Risk (01/29/2024)  Financial Resource Strain: Low Risk  (05/06/2023)  Physical Activity: Inactive (09/24/2023)  Social Connections: Socially Isolated (01/30/2024)  Stress: No Stress Concern Present (05/06/2023)  Tobacco Use: Low Risk  (01/30/2024)  Health Literacy: Adequate Health Literacy (09/24/2023)    Readmission Risk Interventions     No data to display

## 2024-02-04 NOTE — Assessment & Plan Note (Signed)
 Asymptomatic with intermittent PVCs on tele. -Appreciate Cardiology recs:  -Start low-dose metoprolol succinate 25 mg PO daily

## 2024-02-04 NOTE — Assessment & Plan Note (Addendum)
 Urine cultures growing pansensitive E. coli, and Proteus sensitive to CTX. -PureWick ordered -Cephalexin 500 mg BID for cellulitis coverage as above -Strict I&Os

## 2024-02-05 DIAGNOSIS — N184 Chronic kidney disease, stage 4 (severe): Secondary | ICD-10-CM | POA: Diagnosis not present

## 2024-02-05 DIAGNOSIS — I493 Ventricular premature depolarization: Secondary | ICD-10-CM | POA: Diagnosis not present

## 2024-02-05 DIAGNOSIS — N390 Urinary tract infection, site not specified: Secondary | ICD-10-CM | POA: Diagnosis not present

## 2024-02-05 DIAGNOSIS — R531 Weakness: Secondary | ICD-10-CM | POA: Diagnosis not present

## 2024-02-05 DIAGNOSIS — I5043 Acute on chronic combined systolic (congestive) and diastolic (congestive) heart failure: Secondary | ICD-10-CM | POA: Diagnosis not present

## 2024-02-05 DIAGNOSIS — A419 Sepsis, unspecified organism: Secondary | ICD-10-CM | POA: Diagnosis not present

## 2024-02-05 LAB — BASIC METABOLIC PANEL
Anion gap: 9 (ref 5–15)
BUN: 66 mg/dL — ABNORMAL HIGH (ref 8–23)
CO2: 19 mmol/L — ABNORMAL LOW (ref 22–32)
Calcium: 9 mg/dL (ref 8.9–10.3)
Chloride: 104 mmol/L (ref 98–111)
Creatinine, Ser: 1.9 mg/dL — ABNORMAL HIGH (ref 0.44–1.00)
GFR, Estimated: 26 mL/min — ABNORMAL LOW (ref >60)
Glucose, Bld: 240 mg/dL — ABNORMAL HIGH (ref 70–99)
Potassium: 4.3 mmol/L (ref 3.5–5.1)
Sodium: 132 mmol/L — ABNORMAL LOW (ref 135–145)

## 2024-02-05 LAB — GLUCOSE, CAPILLARY
Glucose-Capillary: 194 mg/dL — ABNORMAL HIGH (ref 70–99)
Glucose-Capillary: 245 mg/dL — ABNORMAL HIGH (ref 70–99)
Glucose-Capillary: 323 mg/dL — ABNORMAL HIGH (ref 70–99)
Glucose-Capillary: 255 mg/dL — ABNORMAL HIGH (ref 70–99)

## 2024-02-05 LAB — MAGNESIUM: Magnesium: 2.1 mg/dL (ref 1.7–2.4)

## 2024-02-05 MED ORDER — INSULIN GLARGINE-YFGN 100 UNIT/ML ~~LOC~~ SOLN
2.0000 [IU] | Freq: Once | SUBCUTANEOUS | Status: AC
  Administered 2024-02-05: 2 [IU] via SUBCUTANEOUS
  Filled 2024-02-05: qty 0.02

## 2024-02-05 MED ORDER — INSULIN GLARGINE-YFGN 100 UNIT/ML ~~LOC~~ SOLN
14.0000 [IU] | Freq: Every day | SUBCUTANEOUS | Status: DC
  Administered 2024-02-06: 14 [IU] via SUBCUTANEOUS
  Filled 2024-02-05: qty 0.14

## 2024-02-05 MED ORDER — POLYETHYLENE GLYCOL 3350 17 G PO PACK
17.0000 g | PACK | Freq: Two times a day (BID) | ORAL | Status: DC
  Administered 2024-02-05 – 2024-02-07 (×2): 17 g via ORAL
  Filled 2024-02-05 (×3): qty 1

## 2024-02-05 MED ORDER — INSULIN GLARGINE 100 UNIT/ML ~~LOC~~ SOLN
2.0000 [IU] | Freq: Once | SUBCUTANEOUS | Status: DC
  Filled 2024-02-05: qty 0.02

## 2024-02-05 MED ORDER — SENNA 8.6 MG PO TABS
1.0000 | ORAL_TABLET | Freq: Two times a day (BID) | ORAL | Status: DC
  Administered 2024-02-05 – 2024-02-07 (×2): 8.6 mg via ORAL
  Filled 2024-02-05 (×3): qty 1

## 2024-02-05 NOTE — Progress Notes (Signed)
 Physical Therapy Treatment Patient Details Name: [name] MRN: [id number] DOB: [birth year] Today's Date: 02/05/2024   History of Present Illness Pt is 83 yo presenting to [facility] ED on 3/19 due to generalized weakness and BLE swelling. Pt with sepsis 2/2 cellulitis. PMH: CHF, CKD 4, DM II, diabetic neuropathy, GERD, CVA 2018, Afib on eliquis, OSA and thrombocytopenia, macular degeneration, CAD, venous stasis    PT Comments  Pt resting in bed on arrival and agreeable to session with slow but steady progress towards acute goals. Pt continues to be limited in safe mobility by LE weakness, decreased activity tolerance, decreased shoulder AROM, and poor balance/postural reactions. Pt seen in conjunction with OT to optimize OOB mobility progression. Pt able to demonstrate short gait in room with up to mod A +2 and chair follow as pt with knees flexed in stance, needing increased cues and physical assist to shift weight to advance RLE as well as maintain balance. Pt continues to require extensive assist to perform all bed mobility as pt normally sleeps in lift chair. Patient will benefit from continued inpatient follow up therapy, <3 hours/day, will continue to follow acutely.    If plan is discharge home, recommend the following: A lot of help with walking and/or transfers;A lot of help with bathing/dressing/bathroom;Assist for transportation;Help with stairs or ramp for entrance;Assistance with cooking/housework   Can travel by private vehicle     No  Equipment Recommendations  None recommended by PT    Recommendations for Other Services       Precautions / Restrictions Precautions Precautions: Fall Restrictions Weight Bearing Restrictions Per Provider Order: No     Mobility  Bed Mobility Overal bed mobility: Needs Assistance Bed Mobility: Supine to Sit, Sit to Supine     Supine to sit: +2 for physical assistance, Max assist Sit to supine: +2 for physical assistance, Total assist    General bed mobility comments: pt initiating LEs toward EOB, assist to raise trunk and advance hips to EOB with bed pad, assist for all aspect returning to supine    Transfers Overall transfer level: Needs assistance Equipment used: Rolling walker (2 wheels) Transfers: Sit to/from Stand, Bed to chair/wheelchair/BSC Sit to Stand: From elevated surface, Mod assist, Max assist   Step pivot transfers: +2 physical assistance, Mod assist            Ambulation/Gait Ambulation/Gait assistance: Mod assist, +2 physical assistance, +2 safety/equipment Gait Distance (Feet): 5 Feet Assistive device: Rolling walker (2 wheels) Gait Pattern/deviations: Step-to pattern, Decreased stance time - right, Decreased stance time - left, Knee flexed in stance - right, Knee flexed in stance - left, Shuffle, Trunk flexed Gait velocity: very slowed     General Gait Details: trunk significantly flexed throughout with pt unable to correct, low effortful steps with cues for weight shift to L to clear RLE. VERY slow cadence, ~10 mins to ambulate 5'   Stairs             Wheelchair Mobility     Tilt Bed    Modified Rankin (Stroke Patients Only)       Balance Overall balance assessment: Needs assistance   Sitting balance-Leahy Scale: Fair     Standing balance support: Bilateral upper extremity supported Standing balance-Leahy Scale: Poor Standing balance comment: reliant on RW and external support                            Communication Communication Communication: No  apparent difficulties  Cognition Arousal: Alert Behavior During Therapy: Anxious                             Following commands: Impaired Following commands impaired: Follows one step commands with increased time    Cueing Cueing Techniques: Verbal cues, Gestural cues  Exercises      General Comments General comments (skin integrity, edema, etc.): son present and supportive       Pertinent Vitals/Pain Pain Assessment Pain Assessment: Faces Faces Pain Scale: Hurts little more Pain Location: LEs Pain Descriptors / Indicators: Grimacing, Guarding, Discomfort Pain Intervention(s): Monitored during session, Limited activity within patient's tolerance    Home Living                          Prior Function            PT Goals (current goals can now be found in the care plan section) Acute Rehab PT Goals PT Goal Formulation: With patient/family Time For Goal Achievement: 02/14/24 Progress towards PT goals: Progressing toward goals    Frequency    Min 2X/week      PT Plan      Co-evaluation PT/OT/SLP Co-Evaluation/Treatment: Yes Reason for Co-Treatment: For patient/therapist safety PT goals addressed during session: Balance;Mobility/safety with mobility;Proper use of DME OT goals addressed during session: ADL's and self-care;Strengthening/ROM      AM-PAC PT "6 Clicks" Mobility   Outcome Measure  Help needed turning from your back to your side while in a flat bed without using bedrails?: Total Help needed moving from lying on your back to sitting on the side of a flat bed without using bedrails?: Total Help needed moving to and from a bed to a chair (including a wheelchair)?: Total Help needed standing up from a chair using your arms (e.g., wheelchair or bedside chair)?: Total Help needed to walk in hospital room?: Total Help needed climbing 3-5 steps with a railing? : Total 6 Click Score: 6    End of Session Equipment Utilized During Treatment: Gait belt Activity Tolerance: Patient tolerated treatment well;Patient limited by fatigue Patient left: in bed;with call bell/phone within reach;with family/visitor present Nurse Communication: Mobility status PT Visit Diagnosis: Unsteadiness on feet (R26.81);Other abnormalities of gait and mobility (R26.89);Muscle weakness (generalized) (M62.81)     Time: 1610-9604 PT Time Calculation  (min) (ACUTE ONLY): 31 min  Charges:    $Gait Training: 8-22 mins PT General Charges $$ ACUTE PT VISIT: 1 Visit                     Brayah Urquilla R. PTA Acute Rehabilitation Services Office: [phone]   Catalina Antigua 02/05/2024, 2:43 PM

## 2024-02-05 NOTE — Progress Notes (Addendum)
 Patient Name: [name] Date of Encounter: 02/05/2024 [facility] Cardiologist: Armanda Magic, MD   Interval Summary  .    Shortness of breath has improved over the past day and is having good urine output.  Vital Signs .    Vitals:   02/05/24 0400 02/05/24 0412 02/05/24 0425 02/05/24 0813  BP:  129/62  (!) 110/56  Pulse:  88  79  Resp: 20 (!) 21    Temp:  98.5 F (36.9 C)  98 F (36.7 C)  TempSrc:  Oral  Oral  SpO2:  96%  96%  Weight:   111.2 kg   Height:        Intake/Output Summary (Last 24 hours) at 02/05/2024 1057 Last data filed at 02/05/2024 0830 Gross per 24 hour  Intake 590 ml  Output 1900 ml  Net -1310 ml      02/05/2024    4:25 AM 02/04/2024    7:01 AM 02/04/2024    3:13 AM  Last 3 Weights  Weight (lbs) 245 lb 2.4 oz 245 lb 13 oz 245 lb 13 oz  Weight (kg) 111.2 kg 111.5 kg 111.5 kg      Telemetry/ECG    Atrial fibrillation with frequent PVC including several episodes of 3 PVC's in a row (nonsustained VT), and bigeminy, PVC burden was calculated at 19% (6645/34879).  - Personally Reviewed  Physical Exam .   GEN: No acute distress on room air.   Neck:  JVD 4+ cm Cardiac: irregularly irregular rhythm, no murmurs, rubs, or gallops.  Respiratory: diminished bibasilar breath sounds. MS: 2+ edema  Assessment & Plan .    83 y.o. female has hx of CAD status post CABG 2009, ischemic cardiomyopathy with HFrEF, persistent atrial fibrillation, CVA May 2017, CKD stage IV, type 2 diabetes, GI bleed 2018, OSA, who is admitted for sepsis 2/2 leg cellulitis and UTI, hyperkalemia, received fluid resuscitation . Cardiology now following for acute HFrEF.   Acute on chronic combined heart failure  - probably due to fluid resuscitation initially for sepsis  - Echo 01/2023 showed EF 50%, basal inferolateral hypokinesis, mildly decreased RV systolic function, mod LAE and RAE, mild MR, mild to mod TR, mild AI, aortic sclerosis,  dilated IVC, PASP 47.3 mmHg -  Echo from  01/31/24 showed LVEF down to 35-40%, global hypokinesis, mild reduced RV, mod LAE, mild MR, trivial AI, aortic sclerosis, IVC dilated  - CXR from 01/30/24 showed Mild diffuse pulmonary vascular congestion without frank pulmonary edema. - PTA was on torsemide 100mg  BID, in hospital was on PO Bumex 1mg  BID, this was stopped due to low daily urine output (net up ) and IV lasix 80mg  BID was started. Over the past day net urine output increased to down 1.495 L. Net down about 5L this admission. Potassium and magnesium are normal. Cr increased slightly to 1.9 from 1.78. BUN increased from 57 to 66 BP stable but remains soft. - Continue IV lasix 80 mg BID for one more day with plans to transition to oral diuresis tomorrow. - GDMT: spironolactone discontinued due to hyperkalemia, no SGLT2i given UTI, PTA coreg held likely due to low normal BP/OK, if BP recovers, would add back beta blocker; renal disease limiting GDMT.  - PVC burden on inpatient telemetry calculated at 19%. Possibly contributed to reduced LVEF. It is also possible that sepsis contributed to reduced LVEF. - Home Coreg was initially held due to low BP. Systolic has improved but diastolic remains low.  -  Restart BB as BP allows   CAD s/p CABG 2009 - no chest pain - Hold Coreg until BP increases, continue lipitor 40mg , not on ASA due to DOAC use    Persistent atrial fibrillation  - remains in A fib, rate controlled , off coreg, resume BB when able  - continue PTA Eliquis 2.5mg  BID, appopriate dose   High PVC burden - PVC burden on inpatient telemetry calculated at 19%. - will resume BB as BP's allow. - Possibly contributed to reduced LVEF. - consider a cardiac monitor after BB restarted.    UTI Cellulitis CKD IIIb DM  - per primary team   For questions or updates, please contact  [facility] Please consult [email or web address] for contact info under        Signed, Arabella Merles, PA-C   Patient seen and examined.   Agree with above documentation.  On exam, patient is alert and oriented, irregular rhythm, normal rate no murmurs, diminished breath sounds, + JVD, legs wrapped.  Net -1.5 L yesterday.  Creatinine stable at 1.9.  Would continue IV Lasix today, suspect will be able to transition to p.o. tomorrow.  Little Ishikawa, MD

## 2024-02-05 NOTE — Assessment & Plan Note (Addendum)
 Remains A&Ox4.  Suspect weakness was in setting of cellulitis/UTI.  Concerning nighttime wakefulness and daytime sleep, high risk for delirium. -Continue to monitor for confusion closely -Delirium precautions -Melatonin 3 mg QHS -Fall precautions -PT/OT

## 2024-02-05 NOTE — Assessment & Plan Note (Addendum)
 Suspected CHF exacerbation secondary to IV fluid treatment given hyperkalemia on initial presentation.  Echo this admission with EF of 35-40% (previously 50% on March 2024) with global hypokinesis of the LV.  Diuresis per cardiology. -Holding home spironolactone, carvedilol, torsemide -Strict I/Os, daily weights -Caution with fluid resuscitation -AM BMP, Mag; goal K>4, Mag>2 -HF consulted, follow up recommendation updates today:  -Lasix 80 mg IV BID

## 2024-02-05 NOTE — Assessment & Plan Note (Signed)
 Admitted for sepsis contributing to cellulitis of right leg.  Patient has remained afebrile and leukocytosis improving. -Wound care consult, appreciate assistance -Cephalexin 500 mg BID for 7 day course total (3/20-3/26) -Tylenol 650 mg Q6h PRN -Follow Bcx, NGTD -AM CBC, BMP

## 2024-02-05 NOTE — Progress Notes (Signed)
 Daily Progress Note Intern Pager: [phone]  Patient name: [name] Medical record number: [id number] Date of birth: 04/28/1941 Age: 83 y.o. Gender: female  Primary Care Provider: Evette Georges, MD Consultants: Cardiology (HF) Code Status: DNR limited  Pt Overview and Major Events to Date:  3/20 - Admitted, vancomycin, CTX started, hyperkalemic 3/21 - CHF exacerbation s/p fluids, began diuresis 3/22 - Discontinue vancomycin 3/23 - Discontinue CTX, switch to cephalexin 3/25 - Switched to Lasix IV   Assessment and Plan: [name] is a 83 y.o. female with a pertinent PMH of CKD III, CAD, A-fib on Eliquis, CVA, T2DM, HTN, and HFrEF who was admitted for with sepsis 2/2 RLE cellulitis and UTI, requiring IVF for hypotension and hyperkalemia which precipitated CHF exacerbation, currently undergoing diuresis per Cardiology.  24 hour UOP of 2.3 L, net negative 5 L this admission. Weight down to 111.2 kg from 111.5 kg, was 106.6 on admission if discounting ED weight. K and Mag stable.  Creatinine stable near baseline. Assessment & Plan CHF (congestive heart failure) (HCC) Suspected CHF exacerbation secondary to IV fluid treatment given hyperkalemia on initial presentation.  Echo this admission with EF of 35-40% (previously 50% on March 2024) with global hypokinesis of the LV.  Diuresis per cardiology. -Holding home spironolactone, carvedilol, torsemide -Strict I/Os, daily weights -Caution with fluid resuscitation -AM BMP, Mag; goal K>4, Mag>2 -HF consulted, follow up recommendation updates today:  -Lasix 80 mg IV BID Cellulitis of right leg Admitted for sepsis contributing to cellulitis of right leg.  Patient has remained afebrile and leukocytosis improving. -Wound care consult, appreciate assistance -Cephalexin 500 mg BID for 7 day course total (3/20-3/26) -Tylenol 650 mg Q6h PRN -Follow Bcx, NGTD -AM CBC, BMP NSVT (nonsustained ventricular tachycardia)  (HCC) Asymptomatic with intermittent PVCs on tele. -Appreciate Cardiology recs:  -Restart metoprolol when able Lower urinary tract infectious disease Urine cultures growing pansensitive E. coli, and Proteus sensitive to CTX. -PureWick ordered -Cephalexin 500 mg BID for cellulitis coverage as above -Strict I&Os Type 2 diabetes mellitus (HCC) A1c 7.8 this admission.  CBGs still ranging in low 200s, fasting 194, 8 units SAI past 24 hours. Home regimen: Weekly Trulicity. -Sensitive SSI -Increase LAI to 14 units -CBGs ACHS Pressure injury of skin Stage I sacral pressure ulcer. -Zinc paste daily and with BMs/dressing changes Generalized weakness Remains A&Ox4.  Suspect weakness was in setting of cellulitis/UTI.  Concerning nighttime wakefulness and daytime sleep, high risk for delirium. -Continue to monitor for confusion closely -Delirium precautions -Melatonin 3 mg QHS -Fall precautions -PT/OT Chronic health problem CAD - s/p CABG in 2009 with 2 stents, continue atorvastatin 40 mg daily CKD IV - Monitor Cr Hypertension - See CHF above A-fib - Continue Eliquis 2.5 mg BID (renally dosed), cardiac telemetry Restless leg syndrome - Continue Requip 1 mg at bedtime  Intertrigo - Continue nystatin powder to gluteal folds Hx CVA - Continue Eliquis OSA - Does not use CPAP  FEN/GI: Carb modified, heart healthy; 1800 mL fluid restriction PPx: Eliquis 2.5 mg twice daily Dispo: [facility] pending diuresis.  Subjective:  This morning, the patient's son is at bedside.  Both he and patient reports that she slept much better overnight, and lower extremity pain has improved greatly.  She is feeling well overall.  Denies dyspnea, chest pain.  Objective: Temp:  [98.3 F (36.8 C)-98.8 F (37.1 C)] 98.5 F (36.9 C) (03/26 0412) Pulse Rate:  [75-93] 88 (03/26 0412) Resp:  [18-21] 21 (03/26 0412) BP: (  115-129)/(37-62) 129/62 (03/26 0412) SpO2:  [96 %-100 %] 96 % (03/26 0412) Weight:  [111.2  kg-111.5 kg] 111.2 kg (03/26 0425)  Physical Exam: General: Deconditioned, resting comfortably in bed, NAD, alert and oriented x4. Cardiovascular: JVP to 5-6 below angle of jaw, improving.  Irregular rate and rhythm. Normal S1/S2. No murmurs, rubs, or gallops appreciated. 2+ radial pulses. Pulmonary: Clear bilaterally to ascultation. No wheezes, crackles, or rhonchi. Normal WOB on room air, no accessory muscle usage. Abdominal: Normoactive bowel sounds, nondistended. No tenderness to deep or light palpation. No rebound or guarding. Extremities/Skin: Mildly TTP RLE cellulitis, greatly improved erythema, warmth, and edema relative to makings.  BLE wounds wrapped in gauze, minimal drainage.  1+ pitting peripheral edema bilaterally at ankles. Capillary refill <2 seconds.  Laboratory: Most recent CBC Lab Results  Component Value Date   WBC 6.3 02/03/2024   HGB 8.7 (L) 02/03/2024   HCT 27.1 (L) 02/03/2024   MCV 89.1 02/03/2024   PLT 182 02/03/2024   Most recent BMP    Latest Ref Rng & Units 02/05/2024    3:46 AM  BMP  Glucose 70 - 99 mg/dL 270   BUN 8 - 23 mg/dL 66   Creatinine 3.50 - 1.00 mg/dL 0.93   Sodium 818 - 299 mmol/L 132   Potassium 3.5 - 5.1 mmol/L 4.3   Chloride 98 - 111 mmol/L 104   CO2 22 - 32 mmol/L 19   Calcium 8.9 - 10.3 mg/dL 9.0     Other pertinent labs: -None  New Imaging/Diagnostic Tests: -None  [name] Steidle, MD 02/05/2024, 7:00 AM  PGY-1, [facility] FPTS Intern pager: [phone], text pages welcome Secure chat group [facility] Teaching Service

## 2024-02-05 NOTE — Assessment & Plan Note (Signed)
 CAD - s/p CABG in 2009 with 2 stents, continue atorvastatin 40 mg daily CKD IV - Monitor Cr Hypertension - See CHF above A-fib - Continue Eliquis 2.5 mg BID (renally dosed), cardiac telemetry Restless leg syndrome - Continue Requip 1 mg at bedtime  Intertrigo - Continue nystatin powder to gluteal folds Hx CVA - Continue Eliquis OSA - Does not use CPAP

## 2024-02-05 NOTE — Progress Notes (Signed)
 Occupational Therapy Treatment Patient Details Name: [name] MRN: [id number] DOB: February 15, 1941 Today's Date: 02/05/2024   History of present illness Pt is 83 yo presenting to [facility] ED on 3/19 due to generalized weakness and BLE swelling. Pt with sepsis 2/2 cellulitis. PMH: CHF, CKD 4, DM II, diabetic neuropathy, GERD, CVA 2018, Afib on eliquis, OSA and thrombocytopenia, macular degeneration, CAD, venous stasis   OT comments  Pt readily willing to work with therapies. Demonstrated ability to ambulate in room with close chair follow and +2 mod assist. Stood from elevated bed with +2 mod assist and from lower recliner with +2 max assist. Pt completed 2 grooming tasks seated at sink with min assist. Continues to require extensive assist for bed mobility as she typically sleeps in her lift chair. Supportive son at bedside also encouraging pt. Patient will benefit from continued inpatient follow up therapy, <3 hours/day       If plan is discharge home, recommend the following:  Two people to help with walking and/or transfers;Two people to help with bathing/dressing/bathroom;Assistance with cooking/housework;Assistance with feeding;Direct supervision/assist for medications management;Direct supervision/assist for financial management;Assist for transportation;Help with stairs or ramp for entrance   Equipment Recommendations  Other (comment) (defer)    Recommendations for Other Services      Precautions / Restrictions Precautions Precautions: Fall Restrictions Weight Bearing Restrictions Per Provider Order: No       Mobility Bed Mobility Overal bed mobility: Needs Assistance Bed Mobility: Supine to Sit, Sit to Supine     Supine to sit: +2 for physical assistance, Max assist Sit to supine: +2 for physical assistance, Total assist   General bed mobility comments: pt initiating LEs toward EOB, assist to raise trunk and advance hips to EOB with bed pad, assist for all aspect returning to  supine    Transfers Overall transfer level: Needs assistance Equipment used: Rolling walker (2 wheels) Transfers: Sit to/from Stand, Bed to chair/wheelchair/BSC Sit to Stand: From elevated surface, Mod assist, Max assist     Step pivot transfers: +2 physical assistance, Mod assist           Balance Overall balance assessment: Needs assistance   Sitting balance-Leahy Scale: Fair     Standing balance support: Bilateral upper extremity supported Standing balance-Leahy Scale: Poor Standing balance comment: reliant on RW and external support                           ADL either performed or assessed with clinical judgement   ADL Overall ADL's : Needs assistance/impaired     Grooming: Oral care;Brushing hair;Sitting;Minimal assistance           Upper Body Dressing : Maximal assistance;Sitting Upper Body Dressing Details (indicate cue type and reason): front opening gown Lower Body Dressing: Total assistance               Functional mobility during ADLs: +2 for physical assistance;Rolling walker (2 wheels);Moderate assistance      Extremity/Trunk Assessment              Vision       Perception     Praxis     Communication Communication Communication: No apparent difficulties   Cognition Arousal: Alert Behavior During Therapy: Anxious Cognition: Cognition impaired     Awareness: Intellectual awareness intact, Online awareness intact Memory impairment (select all impairments): Short-term memory, Working Biochemist, clinical functioning impairment (select all impairments): Initiation, Sequencing  Following commands: Impaired Following commands impaired: Follows one step commands with increased time      Cueing   Cueing Techniques: Verbal cues, Gestural cues  Exercises      Shoulder Instructions       General Comments      Pertinent Vitals/ Pain       Pain Assessment Pain Assessment: Faces Faces  Pain Scale: Hurts little more Pain Location: LEs Pain Descriptors / Indicators: Grimacing, Guarding, Discomfort Pain Intervention(s): Monitored during session, Repositioned  Home Living                                          Prior Functioning/Environment              Frequency  Min 2X/week        Progress Toward Goals  OT Goals(current goals can now be found in the care plan section)  Progress towards OT goals: Progressing toward goals  Acute Rehab OT Goals OT Goal Formulation: With patient/family Time For Goal Achievement: 02/14/24 Potential to Achieve Goals: Good  Plan      Co-evaluation    PT/OT/SLP Co-Evaluation/Treatment: Yes Reason for Co-Treatment: For patient/therapist safety   OT goals addressed during session: ADL's and self-care;Strengthening/ROM      AM-PAC OT "6 Clicks" Daily Activity     Outcome Measure   Help from another person eating meals?: A Little Help from another person taking care of personal grooming?: A Lot Help from another person toileting, which includes using toliet, bedpan, or urinal?: Total Help from another person bathing (including washing, rinsing, drying)?: Total Help from another person to put on and taking off regular upper body clothing?: A Lot Help from another person to put on and taking off regular lower body clothing?: Total 6 Click Score: 10    End of Session Equipment Utilized During Treatment: Gait belt;Rolling walker (2 wheels)  OT Visit Diagnosis: Unsteadiness on feet (R26.81);Pain;Muscle weakness (generalized) (M62.81);Other symptoms and signs involving cognitive function   Activity Tolerance Patient tolerated treatment well   Patient Left in bed;with call bell/phone within reach;with bed alarm set;with family/visitor present   Nurse Communication          Time: 4098-1191 OT Time Calculation (min): 31 min  Charges: OT General Charges $OT Visit: 1 Visit OT Treatments $Self  Care/Home Management : 8-22 mins  Berna Spare, OTR/L Acute Rehabilitation Services Office: [phone]   Evern Bio 02/05/2024, 12:05 PM

## 2024-02-05 NOTE — Assessment & Plan Note (Signed)
 Asymptomatic with intermittent PVCs on tele. -Appreciate Cardiology recs:  -Restart metoprolol when able

## 2024-02-05 NOTE — Assessment & Plan Note (Signed)
 Stage I sacral pressure ulcer. -Zinc paste daily and with BMs/dressing changes

## 2024-02-05 NOTE — Assessment & Plan Note (Addendum)
 A1c 7.8 this admission.  CBGs still ranging in low 200s, fasting 194, 8 units SAI past 24 hours. Home regimen: Weekly Trulicity. -Sensitive SSI -Increase LAI to 14 units -CBGs ACHS

## 2024-02-05 NOTE — Assessment & Plan Note (Signed)
 Urine cultures growing pansensitive E. coli, and Proteus sensitive to CTX. -PureWick ordered -Cephalexin 500 mg BID for cellulitis coverage as above -Strict I&Os

## 2024-02-06 DIAGNOSIS — R799 Abnormal finding of blood chemistry, unspecified: Secondary | ICD-10-CM | POA: Insufficient documentation

## 2024-02-06 DIAGNOSIS — I5043 Acute on chronic combined systolic (congestive) and diastolic (congestive) heart failure: Secondary | ICD-10-CM | POA: Diagnosis not present

## 2024-02-06 DIAGNOSIS — I493 Ventricular premature depolarization: Secondary | ICD-10-CM | POA: Diagnosis not present

## 2024-02-06 LAB — BASIC METABOLIC PANEL WITH GFR
Anion gap: 10 (ref 5–15)
BUN: 70 mg/dL — ABNORMAL HIGH (ref 8–23)
CO2: 18 mmol/L — ABNORMAL LOW (ref 22–32)
Calcium: 9 mg/dL (ref 8.9–10.3)
Chloride: 105 mmol/L (ref 98–111)
Creatinine, Ser: 1.85 mg/dL — ABNORMAL HIGH (ref 0.44–1.00)
GFR, Estimated: 27 mL/min — ABNORMAL LOW (ref >60)
Glucose, Bld: 199 mg/dL — ABNORMAL HIGH (ref 70–99)
Potassium: 3.7 mmol/L (ref 3.5–5.1)
Sodium: 133 mmol/L — ABNORMAL LOW (ref 135–145)

## 2024-02-06 LAB — GLUCOSE, CAPILLARY
Glucose-Capillary: 214 mg/dL — ABNORMAL HIGH (ref 70–99)
Glucose-Capillary: 213 mg/dL — ABNORMAL HIGH (ref 70–99)
Glucose-Capillary: 165 mg/dL — ABNORMAL HIGH (ref 70–99)
Glucose-Capillary: 153 mg/dL — ABNORMAL HIGH (ref 70–99)
Glucose-Capillary: 182 mg/dL — ABNORMAL HIGH (ref 70–99)

## 2024-02-06 LAB — MAGNESIUM: Magnesium: 2.2 mg/dL (ref 1.7–2.4)

## 2024-02-06 LAB — IRON AND TIBC
Iron: 25 ug/dL — ABNORMAL LOW (ref 28–170)
Saturation Ratios: 8 % — ABNORMAL LOW (ref 10.4–31.8)
TIBC: 330 ug/dL (ref 250–450)
UIBC: 305 ug/dL

## 2024-02-06 LAB — HEMOGLOBIN AND HEMATOCRIT, BLOOD
HCT: 28.8 % — ABNORMAL LOW (ref 36.0–46.0)
Hemoglobin: 9.3 g/dL — ABNORMAL LOW (ref 12.0–15.0)

## 2024-02-06 LAB — FERRITIN: Ferritin: 85 ng/mL (ref 11–307)

## 2024-02-06 MED ORDER — TORSEMIDE 20 MG PO TABS: 80.0000 mg | ORAL_TABLET | Freq: Two times a day (BID) | ORAL | Status: DC

## 2024-02-06 MED ORDER — TORSEMIDE 20 MG PO TABS
80.0000 mg | ORAL_TABLET | Freq: Every day | ORAL | Status: DC
Start: 2024-02-07 — End: 2024-02-07
  Administered 2024-02-07: 80 mg via ORAL
  Filled 2024-02-06: qty 4

## 2024-02-06 MED ORDER — INSULIN GLARGINE-YFGN 100 UNIT/ML ~~LOC~~ SOLN
18.0000 [IU] | Freq: Every day | SUBCUTANEOUS | Status: DC
Start: 2024-02-07 — End: 2024-02-07
  Administered 2024-02-07: 18 [IU] via SUBCUTANEOUS
  Filled 2024-02-06: qty 0.18

## 2024-02-06 MED ORDER — POTASSIUM CHLORIDE CRYS ER 20 MEQ PO TBCR
40.0000 meq | EXTENDED_RELEASE_TABLET | Freq: Once | ORAL | Status: AC
  Administered 2024-02-06: 40 meq via ORAL
  Filled 2024-02-06: qty 2

## 2024-02-06 MED ORDER — METOPROLOL SUCCINATE ER 25 MG PO TB24
25.0000 mg | ORAL_TABLET | Freq: Every day | ORAL | Status: DC
Start: 2024-02-06 — End: 2024-02-07
  Administered 2024-02-06 – 2024-02-07 (×2): 25 mg via ORAL
  Filled 2024-02-06 (×2): qty 1

## 2024-02-06 NOTE — Progress Notes (Signed)
 Attempted to get out of bed to get standing weight, utilized Stedy device but patient unable to stand and pivot or maintain standing position for more than a few minutes. Bed zeroed and updated bed weight obtained.

## 2024-02-06 NOTE — Assessment & Plan Note (Signed)
 Admitted for sepsis contributing to cellulitis of right leg.  Patient has remained afebrile and leukocytosis improving. -Wound care consult, appreciate assistance -Cephalexin 500 mg BID for 7 day course total (3/20-3/26) -Tylenol 650 mg Q6h PRN -Follow Bcx, NGTD -AM CBC, BMP

## 2024-02-06 NOTE — Assessment & Plan Note (Addendum)
 Suspected CHF exacerbation secondary to IV fluid treatment given hyperkalemia on initial presentation.  Echo this admission with EF of 35-40% (previously 50% on March 2024) with global hypokinesis of the LV.  Diuresis per cardiology. -Holding home spironolactone, carvedilol, torsemide -Strict I/Os, daily weights -Caution with fluid resuscitation -AM BMP, Mag; goal K>4, Mag>2 -HF consulted, follow up recommendation updates today:  -Lasix 80 mg IV BID, considering switch to oral today  -Reduced EF possibly in setting of PVC burden

## 2024-02-06 NOTE — Assessment & Plan Note (Signed)
 CAD - s/p CABG in 2009 with 2 stents, continue atorvastatin 40 mg daily CKD IV - Monitor Cr Hypertension - See CHF above A-fib - Continue Eliquis 2.5 mg BID (renally dosed), cardiac telemetry Restless leg syndrome - Continue Requip 1 mg at bedtime  Intertrigo - Continue nystatin powder to gluteal folds Hx CVA - Continue Eliquis OSA - Does not use CPAP

## 2024-02-06 NOTE — Assessment & Plan Note (Signed)
 Stage I sacral pressure ulcer. -Zinc paste daily and with BMs/dressing changes

## 2024-02-06 NOTE — Progress Notes (Signed)
 Daily Progress Note Intern Pager: [phone]  Patient name: [name] Medical record number: [id number] Date of birth: [birth year] Age: 83 y.o. Gender: female  Primary Care Provider: Evette Georges, MD Consultants: Cardiology (HF) Code Status: DNR limited  Pt Overview and Major Events to Date:  3/20 - Admitted, vancomycin, CTX started, hyperkalemic 3/21 - CHF exacerbation s/p fluids, began diuresis 3/22 - Discontinue vancomycin 3/23 - Discontinue CTX, switch to cephalexin 3/25 - Switched to Lasix IV  Assessment and Plan: [name] is a 83 y.o. female with a pertinent PMH of CKD stage III, CAD, A-fib on Eliquis, CVA, T2DM, HTN, and HFmrEF who presented with sepsis 2/2 UTI or RLE cellulitis (now s/p abx course) and required IVF for hypotension and hyperkalemia, precipitating iatrogenic CHF exacerbation, currently completing diuresis and awaiting SNF placement.  24 hour UOP of 1.8 L, net negative 6.3 L this admission. Weight stable at 112 kg, still somewhat elevated from admission, but all bed weights. K replenished, Mag stable.  Creatinine stable. Assessment & Plan CHF (congestive heart failure) (HCC) Suspected CHF exacerbation secondary to IV fluid treatment given hyperkalemia on initial presentation.  Echo this admission with EF of 35-40% (previously 50% on March 2024) with global hypokinesis of the LV.  Diuresis per cardiology. -Holding home spironolactone, carvedilol, torsemide -Strict I/Os, daily weights -Caution with fluid resuscitation -AM BMP, Mag; goal K>4, Mag>2 -HF consulted, follow up recommendation updates today:  -Lasix 80 mg IV BID, considering switch to oral today  -Reduced EF possibly in setting of PVC burden Elevated BUN BUN has trended up multiple days in a row with anemia as of last CBC 3/24 noted.  Given patient's Eliquis and age, she is at risk for GI bleed and will obtain hemoglobin, pursue GI consult if downtrending. -H&H now -Ferritin, Iron  panel -AM CBC NSVT (nonsustained ventricular tachycardia) (HCC) Asymptomatic with intermittent PVCs on tele.  19% burden on tele per Cards. -Appreciate Cardiology recs:  -Start metoprolol 25 mg daily Cellulitis of right leg Admitted for sepsis contributing to cellulitis of right leg.  Patient has remained afebrile and leukocytosis improving. -Wound care consult, appreciate assistance -Cephalexin 500 mg BID for 7 day course total (3/20-3/26) -Tylenol 650 mg Q6h PRN -Follow Bcx, NGTD -AM CBC, BMP Lower urinary tract infectious disease Urine cultures growing pansensitive E. coli, and Proteus sensitive to CTX. -PureWick ordered -Cephalexin 500 mg BID for cellulitis coverage as above -Strict I&Os Type 2 diabetes mellitus (HCC) A1c 7.8 this admission.  CBGs still elevated, fasting 213 this AM.  10 units SAI past 24 hours. Home regimen: Weekly Trulicity. -Sensitive SSI -Increase LAI to 18 units -CBGs ACHS Pressure injury of skin Stage I sacral pressure ulcer. -Zinc paste daily and with BMs/dressing changes Generalized weakness Remains A&Ox4.  Suspect weakness was in setting of cellulitis/UTI.  Concerning nighttime wakefulness and daytime sleep, high risk for delirium. -Continue to monitor for confusion closely -Delirium precautions -Melatonin 3 mg QHS -Fall precautions -PT/OT Chronic health problem CAD - s/p CABG in 2009 with 2 stents, continue atorvastatin 40 mg daily CKD IV - Monitor Cr Hypertension - See CHF above A-fib - Continue Eliquis 2.5 mg BID (renally dosed), cardiac telemetry Restless leg syndrome - Continue Requip 1 mg at bedtime  Intertrigo - Continue nystatin powder to gluteal folds Hx CVA - Continue Eliquis OSA - Does not use CPAP  FEN/GI: Carb modified/heart healthy; 1800 mL fluid restriction PPx: Eliquis 2.5 mg twice daily Dispo: [facility] pending diuresis. Barriers include  placement.  Subjective:  This morning, patient states that her lower extremities  feel well, reports no pain.  Slept fine overnight.  Enjoying the meals in the hospital.  In good spirits.  Denies chest pain, dyspnea.  Objective: Temp:  [97.7 F (36.5 C)-98.3 F (36.8 C)] 98 F (36.7 C) (03/27 0318) Pulse Rate:  [73-87] 73 (03/27 0318) Resp:  [10-20] 15 (03/27 0318) BP: (106-139)/(45-84) 128/58 (03/27 0318) SpO2:  [92 %-99 %] 92 % (03/27 0318) Weight:  [161 kg] 112 kg (03/27 0119)  Physical Exam: General: Age-appropriate, resting comfortably in bed, NAD, alert and at baseline. Cardiovascular: JVP 6 cm below angle of jaw. Irregular rate and rhythm. Normal S1/S2. No murmurs, rubs, or gallops appreciated. 2+ radial pulses. Pulmonary: Clear bilaterally to ascultation. No wheezes, crackles, or rhonchi. Normal WOB on room air, no accessory muscle usage. Abdominal: Normoactive bowel sounds, nondistended. No tenderness to deep or light palpation. No rebound or guarding. No HSM. Skin: Warm and dry. Extremities: 1+ pitting peripheral edema bilaterally. BLE wrapped.  RLE wound with markedly decreased erythema relative to marks.  Capillary refill <2 seconds.  Laboratory: Most recent CBC Lab Results  Component Value Date   WBC 6.3 02/03/2024   HGB 8.7 (L) 02/03/2024   HCT 27.1 (L) 02/03/2024   MCV 89.1 02/03/2024   PLT 182 02/03/2024   Most recent BMP    Latest Ref Rng & Units 02/06/2024    3:59 AM  BMP  Glucose 70 - 99 mg/dL 096   BUN 8 - 23 mg/dL 70   Creatinine 0.45 - 1.00 mg/dL 4.09   Sodium 811 - 914 mmol/L 133   Potassium 3.5 - 5.1 mmol/L 3.7   Chloride 98 - 111 mmol/L 105   CO2 22 - 32 mmol/L 18   Calcium 8.9 - 10.3 mg/dL 9.0     Other pertinent labs: -None  New Imaging/Diagnostic Tests: -None  [name] Duquette, MD 02/06/2024, 7:19 AM  PGY-1, [facility] FPTS Intern pager: [phone], text pages welcome Secure chat group [facility] Teaching Service

## 2024-02-06 NOTE — Progress Notes (Addendum)
 Patient Name: [name] Date of Encounter: 02/06/2024 [facility] Cardiologist: Armanda Magic, MD   Interval Summary  .    Shortness of breath continues to improve. Denies chest pain, palpitations, lightheadedness.   Vital Signs .    Vitals:   02/05/24 2339 02/06/24 0119 02/06/24 0318 02/06/24 0800  BP: 127/61  (!) 128/58 115/65  Pulse: 80  73 72  Resp: 10  15 19   Temp: 97.9 F (36.6 C)  98 F (36.7 C) 97.8 F (36.6 C)  TempSrc: Oral  Oral Oral  SpO2: 99%  92% 99%  Weight:  112 kg    Height:        Intake/Output Summary (Last 24 hours) at 02/06/2024 0803 Last data filed at 02/06/2024 0118 Gross per 24 hour  Intake 480 ml  Output 1800 ml  Net -1320 ml      02/06/2024    1:19 AM 02/05/2024    4:25 AM 02/04/2024    7:01 AM  Last 3 Weights  Weight (lbs) 246 lb 14.6 oz 245 lb 2.4 oz 245 lb 13 oz  Weight (kg) 112 kg 111.2 kg 111.5 kg      Telemetry/ECG    Atrial fibrillation with rates typically in 70's-80's. Frequent PVC's. PVC burden calculated at 17% (6334/36659) - Personally Reviewed  Physical Exam .   GEN: No acute distress currently on room air.   Neck: 3+ cm JVD Cardiac:Irregularly irregular rhythm, no murmurs, rubs, or gallops.  Respiratory:  slightly reduced breath sounds in lower lobes MS: 2+ edema  Assessment & Plan .      83 y.o. female has hx of CAD status post CABG 2009, ischemic cardiomyopathy with HFrEF, persistent atrial fibrillation, CVA May 2017, CKD stage IV, type 2 diabetes, GI bleed 2018, OSA, who is admitted for sepsis 2/2 leg cellulitis and UTI, hyperkalemia, received fluid resuscitation . Cardiology now following for acute HFrEF.    Acute on chronic combined heart failure  - Probably due to fluid resuscitation initially for sepsis. PTA was followed by heart failure. - Echo 01/2023 showed EF 50%, basal inferolateral hypokinesis, mildly decreased RV systolic function, mod LAE and RAE, mild MR, mild to mod TR, mild AI, aortic  sclerosis,  dilated IVC, PASP 47.3 mmHg - Echo from  01/31/24 showed LVEF down to 35-40%, global hypokinesis, mild reduced RV, mod LAE, mild MR, trivial AI, aortic sclerosis, IVC dilated  - CXR from 01/30/24 showed Mild diffuse pulmonary vascular congestion without frank pulmonary edema. - PTA was on torsemide 100mg  BID and Furoscix, in hospital was on PO Bumex 1mg  BID, this was stopped due to low daily urine output and IV lasix 80mg  BID was started. I/O is net down 6.3 L since admission. Over the past day is net down 1.320L. Weights up 20-30 lbs from prior outpatient visits I suspect these weights are not accurate. Asked nurse to get ambulatory weight today.  Potassium is 3.7 and Magnesium 2.2. received 40mg  Kcl on 02/06/24. Cr is up slightly at 1.85 from 1.90 yesterday this has been stable for the past week. BUN is continuing to increase to 70 from 57 two days ago. Systolic blood pressures are remaining normal to slightly elevated while diastolic pressures continue to be slightly low. -Received IV Lasix this morning, will switch to p.o. torsemide tomorrow - GDMT: spironolactone was discontinued due to hyperkalemia, no SGLT2i given UTI, PTA coreg held likely due to low normal BP/OK, if BP recovers, would add back beta blocker; renal disease  limiting GDMT.  - PVC burden on inpatient telemetry calculated at 19%. Possibly contributed to reduced LVEF. It is also possible that sepsis contributed to reduced LVEF. - Stop home Coreg due to low BP. Blood pressures have increased slightly - Start Metoprolol succinate 25 mg daily due to lower BP's.   CAD s/p CABG 2009 - no chest pain - Hold Coreg until BP increases, continue lipitor 40mg , not on ASA due to DOAC use    Persistent atrial fibrillation  - remains in A fib, rate controlled , off coreg,  - continue PTA Eliquis 2.5mg  BID, appopriate dose    High PVC burden - PVC burden on inpatient telemetry calculated at 17%. - Start metoprolol succinate 25 mg  daily - Possibly contributed to reduced LVEF. - consider a cardiac monitor to assess burden after BB restarted.    Hyperkalemia UTI Cellulitis CKD IIIb DM  - per primary team  For questions or updates, please contact [facility] Please consult [email or web address] for contact info under     Signed, Arabella Merles, PA-C    Patient seen and examined.  Agree with above documentation.  On exam, patient is alert and oriented, regular rate and rhythm, no murmurs, lungs CTAB, legs wrapped, mild JVD.  Net -1.3 L yesterday, -6.3 L on admission.  Creatinine stable at 1.85.  BP 129/54.   Received IV Lasix this morning, will switch to p.o. torsemide tomorrow.  Little Ishikawa, MD

## 2024-02-06 NOTE — Assessment & Plan Note (Signed)
 Remains A&Ox4.  Suspect weakness was in setting of cellulitis/UTI.  Concerning nighttime wakefulness and daytime sleep, high risk for delirium. -Continue to monitor for confusion closely -Delirium precautions -Melatonin 3 mg QHS -Fall precautions -PT/OT

## 2024-02-06 NOTE — Assessment & Plan Note (Signed)
 Urine cultures growing pansensitive E. coli, and Proteus sensitive to CTX. -PureWick ordered -Cephalexin 500 mg BID for cellulitis coverage as above -Strict I&Os

## 2024-02-06 NOTE — Assessment & Plan Note (Addendum)
 A1c 7.8 this admission.  CBGs still elevated, fasting 213 this AM.  10 units SAI past 24 hours. Home regimen: Weekly Trulicity. -Sensitive SSI -Increase LAI to 18 units -CBGs ACHS

## 2024-02-06 NOTE — Assessment & Plan Note (Signed)
 BUN has trended up multiple days in a row with anemia as of last CBC 3/24 noted.  Given patient's Eliquis and age, she is at risk for GI bleed and will obtain hemoglobin, pursue GI consult if downtrending. -H&H now -Ferritin, Iron panel -AM CBC

## 2024-02-06 NOTE — Assessment & Plan Note (Addendum)
 Asymptomatic with intermittent PVCs on tele.  19% burden on tele per Cards. -Appreciate Cardiology recs:  -Start metoprolol 25 mg daily

## 2024-02-06 NOTE — TOC Progression Note (Signed)
 Transition of Care [facility]) - Progression Note    Patient Details  Name: [name] MRN: [id number] Date of Birth: 01/08/1941  Transition of Care [facility]) CM/SW Contact  Erin Sons, [place] Phone Number: 02/06/2024, 1:24 PM  Clinical Narrative:     CSW met with pt and son bedside. Provided SNF bed offers. They choose [facility]. CSW explained SNF auth process.  Confirmed bed offer with [name]; they will initiate snf auth request.   Expected Discharge Plan: Skilled Nursing Facility Barriers to Discharge: Continued Medical Work up, SNF Pending bed offer, English as a second language teacher  Expected Discharge Plan and Services In-house Referral: Clinical Social Work     Living arrangements for the past 2 months: Single Family Home                                       Social Determinants of Health (SDOH) Interventions SDOH Screenings   Food Insecurity: No Food Insecurity (01/30/2024)  Housing: Low Risk  (01/30/2024)  Transportation Needs: No Transportation Needs (01/30/2024)  Utilities: Not At Risk (01/30/2024)  Alcohol Screen: Low Risk  (10/23/2023)  Depression (PHQ2-9): High Risk (01/29/2024)  Financial Resource Strain: Low Risk  (05/06/2023)  Physical Activity: Inactive (09/24/2023)  Social Connections: Socially Isolated (01/30/2024)  Stress: No Stress Concern Present (05/06/2023)  Tobacco Use: Low Risk  (01/30/2024)  Health Literacy: Adequate Health Literacy (09/24/2023)    Readmission Risk Interventions     No data to display

## 2024-02-06 NOTE — Plan of Care (Signed)
 Alert and oriented x4. Attempted to get out of bed to get standing weight, utilized Stedy device but patient unable to stand and pivot or maintain standing position for more than a few minutes. Bed zeroed and updated bed weight obtained.  BLE wound dressings changed. Multiple bowel movements today. Educated patient on use of laxatives and stool softeners causing diarrhea.   Problem: Education: Goal: Ability to describe self-care measures that may prevent or decrease complications (Diabetes Survival Skills Education) will improve Outcome: Progressing   Problem: Coping: Goal: Ability to adjust to condition or change in health will improve Outcome: Progressing   Problem: Fluid Volume: Goal: Ability to maintain a balanced intake and output will improve Outcome: Progressing   Problem: Health Behavior/Discharge Planning: Goal: Ability to identify and utilize available resources and services will improve Outcome: Progressing Goal: Ability to manage health-related needs will improve Outcome: Progressing   Problem: Metabolic: Goal: Ability to maintain appropriate glucose levels will improve Outcome: Progressing

## 2024-02-07 ENCOUNTER — Ambulatory Visit (HOSPITAL_COMMUNITY): Payer: No Typology Code available for payment source

## 2024-02-07 DIAGNOSIS — I493 Ventricular premature depolarization: Secondary | ICD-10-CM | POA: Diagnosis not present

## 2024-02-07 DIAGNOSIS — Z7901 Long term (current) use of anticoagulants: Secondary | ICD-10-CM | POA: Diagnosis not present

## 2024-02-07 DIAGNOSIS — Z6835 Body mass index (BMI) 35.0-35.9, adult: Secondary | ICD-10-CM | POA: Diagnosis not present

## 2024-02-07 DIAGNOSIS — I482 Chronic atrial fibrillation, unspecified: Secondary | ICD-10-CM | POA: Diagnosis not present

## 2024-02-07 DIAGNOSIS — R5381 Other malaise: Secondary | ICD-10-CM | POA: Diagnosis not present

## 2024-02-07 DIAGNOSIS — L98429 Non-pressure chronic ulcer of back with unspecified severity: Secondary | ICD-10-CM | POA: Diagnosis not present

## 2024-02-07 DIAGNOSIS — L03115 Cellulitis of right lower limb: Secondary | ICD-10-CM | POA: Diagnosis not present

## 2024-02-07 DIAGNOSIS — Z8619 Personal history of other infectious and parasitic diseases: Secondary | ICD-10-CM | POA: Diagnosis not present

## 2024-02-07 DIAGNOSIS — I872 Venous insufficiency (chronic) (peripheral): Secondary | ICD-10-CM | POA: Diagnosis not present

## 2024-02-07 DIAGNOSIS — G3184 Mild cognitive impairment, so stated: Secondary | ICD-10-CM | POA: Diagnosis not present

## 2024-02-07 DIAGNOSIS — G4733 Obstructive sleep apnea (adult) (pediatric): Secondary | ICD-10-CM | POA: Diagnosis not present

## 2024-02-07 DIAGNOSIS — E1151 Type 2 diabetes mellitus with diabetic peripheral angiopathy without gangrene: Secondary | ICD-10-CM | POA: Diagnosis not present

## 2024-02-07 DIAGNOSIS — Z7401 Bed confinement status: Secondary | ICD-10-CM | POA: Diagnosis not present

## 2024-02-07 DIAGNOSIS — E119 Type 2 diabetes mellitus without complications: Secondary | ICD-10-CM | POA: Diagnosis not present

## 2024-02-07 DIAGNOSIS — Z7985 Long-term (current) use of injectable non-insulin antidiabetic drugs: Secondary | ICD-10-CM | POA: Diagnosis not present

## 2024-02-07 DIAGNOSIS — I272 Pulmonary hypertension, unspecified: Secondary | ICD-10-CM | POA: Diagnosis not present

## 2024-02-07 DIAGNOSIS — I959 Hypotension, unspecified: Secondary | ICD-10-CM | POA: Diagnosis not present

## 2024-02-07 DIAGNOSIS — Z79899 Other long term (current) drug therapy: Secondary | ICD-10-CM | POA: Diagnosis not present

## 2024-02-07 DIAGNOSIS — Z7984 Long term (current) use of oral hypoglycemic drugs: Secondary | ICD-10-CM | POA: Diagnosis not present

## 2024-02-07 DIAGNOSIS — Z951 Presence of aortocoronary bypass graft: Secondary | ICD-10-CM | POA: Diagnosis not present

## 2024-02-07 DIAGNOSIS — I10 Essential (primary) hypertension: Secondary | ICD-10-CM | POA: Diagnosis not present

## 2024-02-07 DIAGNOSIS — E1169 Type 2 diabetes mellitus with other specified complication: Secondary | ICD-10-CM | POA: Diagnosis not present

## 2024-02-07 DIAGNOSIS — Z9189 Other specified personal risk factors, not elsewhere classified: Secondary | ICD-10-CM | POA: Diagnosis not present

## 2024-02-07 DIAGNOSIS — H353114 Nonexudative age-related macular degeneration, right eye, advanced atrophic with subfoveal involvement: Secondary | ICD-10-CM | POA: Diagnosis not present

## 2024-02-07 DIAGNOSIS — I5023 Acute on chronic systolic (congestive) heart failure: Secondary | ICD-10-CM | POA: Diagnosis not present

## 2024-02-07 DIAGNOSIS — E1122 Type 2 diabetes mellitus with diabetic chronic kidney disease: Secondary | ICD-10-CM | POA: Diagnosis not present

## 2024-02-07 DIAGNOSIS — Z8673 Personal history of transient ischemic attack (TIA), and cerebral infarction without residual deficits: Secondary | ICD-10-CM | POA: Diagnosis not present

## 2024-02-07 DIAGNOSIS — N184 Chronic kidney disease, stage 4 (severe): Secondary | ICD-10-CM | POA: Diagnosis not present

## 2024-02-07 DIAGNOSIS — N183 Chronic kidney disease, stage 3 unspecified: Secondary | ICD-10-CM | POA: Diagnosis not present

## 2024-02-07 DIAGNOSIS — I5022 Chronic systolic (congestive) heart failure: Secondary | ICD-10-CM | POA: Diagnosis not present

## 2024-02-07 DIAGNOSIS — Z791 Long term (current) use of non-steroidal anti-inflammatories (NSAID): Secondary | ICD-10-CM | POA: Diagnosis not present

## 2024-02-07 DIAGNOSIS — R600 Localized edema: Secondary | ICD-10-CM | POA: Diagnosis not present

## 2024-02-07 DIAGNOSIS — I2510 Atherosclerotic heart disease of native coronary artery without angina pectoris: Secondary | ICD-10-CM | POA: Diagnosis not present

## 2024-02-07 DIAGNOSIS — I130 Hypertensive heart and chronic kidney disease with heart failure and stage 1 through stage 4 chronic kidney disease, or unspecified chronic kidney disease: Secondary | ICD-10-CM | POA: Diagnosis not present

## 2024-02-07 DIAGNOSIS — L304 Erythema intertrigo: Secondary | ICD-10-CM | POA: Diagnosis not present

## 2024-02-07 DIAGNOSIS — I255 Ischemic cardiomyopathy: Secondary | ICD-10-CM | POA: Diagnosis not present

## 2024-02-07 DIAGNOSIS — E669 Obesity, unspecified: Secondary | ICD-10-CM | POA: Diagnosis not present

## 2024-02-07 DIAGNOSIS — I5020 Unspecified systolic (congestive) heart failure: Secondary | ICD-10-CM | POA: Diagnosis not present

## 2024-02-07 DIAGNOSIS — I5082 Biventricular heart failure: Secondary | ICD-10-CM | POA: Diagnosis not present

## 2024-02-07 DIAGNOSIS — I5043 Acute on chronic combined systolic (congestive) and diastolic (congestive) heart failure: Secondary | ICD-10-CM | POA: Diagnosis not present

## 2024-02-07 DIAGNOSIS — G2581 Restless legs syndrome: Secondary | ICD-10-CM | POA: Diagnosis not present

## 2024-02-07 DIAGNOSIS — N390 Urinary tract infection, site not specified: Secondary | ICD-10-CM | POA: Diagnosis not present

## 2024-02-07 DIAGNOSIS — R531 Weakness: Secondary | ICD-10-CM | POA: Diagnosis not present

## 2024-02-07 LAB — BASIC METABOLIC PANEL WITH GFR
Anion gap: 9 (ref 5–15)
BUN: 70 mg/dL — ABNORMAL HIGH (ref 8–23)
CO2: 20 mmol/L — ABNORMAL LOW (ref 22–32)
Calcium: 8.9 mg/dL (ref 8.9–10.3)
Chloride: 105 mmol/L (ref 98–111)
Creatinine, Ser: 1.83 mg/dL — ABNORMAL HIGH (ref 0.44–1.00)
GFR, Estimated: 27 mL/min — ABNORMAL LOW (ref >60)
Glucose, Bld: 159 mg/dL — ABNORMAL HIGH (ref 70–99)
Potassium: 4.1 mmol/L (ref 3.5–5.1)
Sodium: 134 mmol/L — ABNORMAL LOW (ref 135–145)

## 2024-02-07 LAB — CBC
HCT: 27.5 % — ABNORMAL LOW (ref 36.0–46.0)
Hemoglobin: 9 g/dL — ABNORMAL LOW (ref 12.0–15.0)
MCH: 28.9 pg (ref 26.0–34.0)
MCHC: 32.7 g/dL (ref 30.0–36.0)
MCV: 88.4 fL (ref 80.0–100.0)
Platelets: 223 10*3/uL (ref 150–400)
RBC: 3.11 MIL/uL — ABNORMAL LOW (ref 3.87–5.11)
RDW: 17.2 % — ABNORMAL HIGH (ref 11.5–15.5)
WBC: 5.4 10*3/uL (ref 4.0–10.5)
nRBC: 0 % (ref 0.0–0.2)

## 2024-02-07 LAB — GLUCOSE, CAPILLARY
Glucose-Capillary: 130 mg/dL — ABNORMAL HIGH (ref 70–99)
Glucose-Capillary: 153 mg/dL — ABNORMAL HIGH (ref 70–99)

## 2024-02-07 LAB — MAGNESIUM: Magnesium: 2.2 mg/dL (ref 1.7–2.4)

## 2024-02-07 MED ORDER — METOPROLOL SUCCINATE ER 25 MG PO TB24: 25.0000 mg | ORAL_TABLET | Freq: Every day | ORAL | Status: DC

## 2024-02-07 MED ORDER — TORSEMIDE 20 MG PO TABS: 80.0000 mg | ORAL_TABLET | Freq: Two times a day (BID) | ORAL | Status: DC

## 2024-02-07 MED ORDER — SENNA 8.6 MG PO TABS: 1.0000 | ORAL_TABLET | Freq: Two times a day (BID) | ORAL | Status: DC

## 2024-02-07 MED ORDER — TORSEMIDE 40 MG PO TABS: 80.0000 mg | ORAL_TABLET | Freq: Two times a day (BID) | ORAL | Status: DC

## 2024-02-07 MED ORDER — POLYETHYLENE GLYCOL 3350 17 G PO PACK
17.0000 g | PACK | Freq: Two times a day (BID) | ORAL | Status: DC
Start: 2024-02-07 — End: 2024-05-20

## 2024-02-07 NOTE — Assessment & Plan Note (Deleted)
 A1c 7.8 this admission.  CBGs still elevated, fasting 130 this AM.  10 units SAI past 24 hours. Home regimen: Weekly Trulicity. -Sensitive SSI -Increase LAI to 18 units -CBGs ACHS

## 2024-02-07 NOTE — Assessment & Plan Note (Deleted)
 Remains A&Ox4.  Suspect weakness was in setting of cellulitis/UTI.  Concerning nighttime wakefulness and daytime sleep, high risk for delirium. -Continue to monitor for confusion closely -Delirium precautions -Melatonin 3 mg QHS -Fall precautions -PT/OT

## 2024-02-07 NOTE — Assessment & Plan Note (Deleted)
 Suspected CHF exacerbation secondary to IV fluid treatment given hyperkalemia on initial presentation.  Echo this admission with EF of 35-40% (previously 50% on March 2024) with global hypokinesis of the LV.  Diuresis per cardiology. -Holding home spironolactone, carvedilol, torsemide -Strict I/Os, daily weights -Caution with fluid resuscitation -AM BMP, Mag; goal K>4, Mag>2 -HF consulted, follow up recommendation updates today:  -Switch from lasix 80 mg IV BID to oral torsemide*** 80 mg BID  -Reduced EF possibly in setting of PVC burden

## 2024-02-07 NOTE — Progress Notes (Addendum)
 Patient Name: [name] Date of Encounter: 02/07/2024 [facility] Cardiologist: Armanda Magic, MD   Interval Summary  .    Denies any chest pain, shortness of breath, diaphoresis, worsening lower extremity edema, worsening orthopnea. Patient feels like lower extremity edema has improved significantly from prior to hospitalization.   Vital Signs .    Vitals:   02/06/24 1958 02/06/24 2348 02/07/24 0348 02/07/24 0807  BP: 103/69 106/68 (!) 116/45 (!) 117/47  Pulse: 72 72 70 68  Resp: 16 19 18    Temp: 97.8 F (36.6 C) 97.8 F (36.6 C) 97.7 F (36.5 C) 98.4 F (36.9 C)  TempSrc: Oral Oral Oral Oral  SpO2: 100% 100% 100% 100%  Weight:   110.4 kg   Height:        Intake/Output Summary (Last 24 hours) at 02/07/2024 0825 Last data filed at 02/07/2024 0352 Gross per 24 hour  Intake 240 ml  Output 800 ml  Net -560 ml      02/07/2024    3:48 AM 02/06/2024    3:30 PM 02/06/2024    1:19 AM  Last 3 Weights  Weight (lbs) 243 lb 6.2 oz 236 lb 12.4 oz 246 lb 14.6 oz  Weight (kg) 110.4 kg 107.4 kg 112 kg      Telemetry/ECG    Atrial fibrillation with rates typically in the 60's and 70's but overnight had some episodes of bradycardia with rates in the 30's (about 1.7 seconds between beats). PVC burden was 6.26% (1918/30657) - Personally Reviewed  Physical Exam .   GEN: No acute distress on room air.  Continues to look fatigued and frail Neck: 3+ JVD Cardiac: Irregularly irregular rhythm, no murmurs, rubs, or gallops.  Respiratory: Clear to auscultation bilaterally. MS: 2+ edema  Assessment & Plan .   83 y.o. female has hx of CAD status post CABG 2009, ischemic cardiomyopathy with HFrEF, persistent atrial fibrillation, CVA May 2017, CKD stage IV, type 2 diabetes, GI bleed 2018, OSA, who is admitted for sepsis 2/2 leg cellulitis and UTI, hyperkalemia, received fluid resuscitation . Cardiology now following for acute HFrEF.    Acute on chronic combined heart failure  -  Probably due to fluid resuscitation initially for sepsis and hyperkalemia. PTA was followed by heart failure. - Echo 01/2023 showed EF 50%, basal inferolateral hypokinesis, mildly decreased RV systolic function, mod LAE and RAE, mild MR, mild to mod TR, mild AI, aortic sclerosis,  dilated IVC, PASP 47.3 mmHg - Echo from  01/31/24 showed LVEF down to 35-40%, global hypokinesis, mild reduced RV, mod LAE, mild MR, trivial AI, aortic sclerosis, IVC dilated  - CXR from 01/30/24 showed Mild diffuse pulmonary vascular congestion without frank pulmonary edema. - PTA was on torsemide 100mg  BID and Furoscix, in hospital was on PO Bumex 1mg  BID, this was stopped due to low daily urine output and IV lasix 80mg  BID was started. I/O is net down 7.4 L since admission. Over the past day is net down 1.120L. Patient feels lower extremity edema has improved significantly and denies shortness of breath. Weights up 20-30 lbs from prior outpatient visits I suspect these bed weights are not accurate. Unable to get ambulatory weight due to patient weakness.  Potassium and Magnesium normal. Cr is stable at 1.83. Systolic blood pressures are remaining normal while diastolic pressures continue to be low. Heart rates typically in the 70's. - Transition to PO torsemide 80 mg BID today  - GDMT: spironolactone was discontinued due to hyperkalemia, no  SGLT2i given UTI, PTA coreg stopped due to low BP metoprolol started. renal disease limiting GDMT.  - PVC burden on inpatient telemetry calculated at 19%. Possibly contributed to reduced LVEF. It is also possible that sepsis contributed to reduced LVEF. - Stop home Coreg due to low BP. Blood pressures have increased slightly - continue Metoprolol succinate 25 mg daily due to lower BP's.   CAD s/p CABG 2009 - no chest pain - Coreg stopped and metoprolol succinate 25mg  daily was started due to BP concerns - Continue lipitor 40mg , not on ASA due to DOAC use.   Persistent atrial  fibrillation Nocturnal bradycardia - remains in A fib, rate controlled , coreg stopped  - continue PTA Eliquis 2.5mg  BID, appopriate dose  - continue Metoprolol succinate 25mg  daily   High PVC burden - PVC burden on inpatient telemetry has improved significantly from 17% on 02/07/24 before starting metoprolol to 6.26% today. Did have brief asymptomatic episodes of bradycardia overnight. - Continue metoprolol succinate 25 mg daily. - Possibly contributed to reduced LVEF.    Normocytic anemia Sepsis Hyperkalemia UTI Cellulitis CKD IIIb DM  - per primary team  For questions or updates, please contact [facility] Please consult [email or web address] for contact info under        Signed, Arabella Merles, PA-C    Patient seen and examined.  Agree with above documentation.  On exam, patient is alert and oriented, regular rate and rhythm, no murmurs, lungs CTAB, legs wrapped, + JVD.  Net -1.1 L yesterday, -7.4 L on admission.  BP 117/47.  Telemetry shows persistent atrial fibrillation, rates well-controlled.  PVC burden has improved since starting metoprolol.  Still with some JVD on exam but volume status has improved, will transition to p.o. torsemide 80 mg twice daily  Little Ishikawa, MD

## 2024-02-07 NOTE — Assessment & Plan Note (Deleted)
 Admitted for sepsis contributing to cellulitis of right leg.  Patient has remained afebrile and leukocytosis improving. -Wound care consult, appreciate assistance -s/p Cephalexin 500 mg BID for 7 day course total (3/20-3/26) -Tylenol 650 mg Q6h PRN -Follow Bcx, NGTD -AM CBC, BMP

## 2024-02-07 NOTE — Assessment & Plan Note (Deleted)
 Stage I sacral pressure ulcer. -Zinc paste daily and with BMs/dressing changes

## 2024-02-07 NOTE — Plan of Care (Addendum)
 Problem: Education: Goal: Ability to describe self-care measures that may prevent or decrease complications (Diabetes Survival Skills Education) will improve 02/07/2024 1230 by Orson Ape, RN Outcome: Adequate for Discharge 02/07/2024 1217 by Orson Ape, RN Outcome: Progressing   Problem: Coping: Goal: Ability to adjust to condition or change in health will improve 02/07/2024 1230 by Orson Ape, RN Outcome: Adequate for Discharge 02/07/2024 1217 by Orson Ape, RN Outcome: Progressing   Problem: Fluid Volume: Goal: Ability to maintain a balanced intake and output will improve 02/07/2024 1230 by Orson Ape, RN Outcome: Adequate for Discharge 02/07/2024 1217 by Orson Ape, RN Outcome: Progressing   Problem: Health Behavior/Discharge Planning: Goal: Ability to identify and utilize available resources and services will improve 02/07/2024 1230 by Orson Ape, RN Outcome: Adequate for Discharge 02/07/2024 1217 by Orson Ape, RN Outcome: Progressing Goal: Ability to manage health-related needs will improve 02/07/2024 1230 by Orson Ape, RN Outcome: Adequate for Discharge 02/07/2024 1217 by Orson Ape, RN Outcome: Progressing   Problem: Metabolic: Goal: Ability to maintain appropriate glucose levels will improve 02/07/2024 1230 by Orson Ape, RN Outcome: Adequate for Discharge 02/07/2024 1217 by Orson Ape, RN Outcome: Progressing   Problem: Nutritional: Goal: Maintenance of adequate nutrition will improve 02/07/2024 1230 by Orson Ape, RN Outcome: Adequate for Discharge 02/07/2024 1217 by Orson Ape, RN Outcome: Progressing Goal: Progress toward achieving an optimal weight will improve 02/07/2024 1230 by Orson Ape, RN Outcome: Adequate for Discharge 02/07/2024 1217 by Orson Ape, RN Outcome: Progressing   Problem: Skin Integrity: Goal: Risk for impaired skin integrity will  decrease 02/07/2024 1230 by Orson Ape, RN Outcome: Adequate for Discharge 02/07/2024 1217 by Orson Ape, RN Outcome: Progressing   Problem: Tissue Perfusion: Goal: Adequacy of tissue perfusion will improve 02/07/2024 1230 by Orson Ape, RN Outcome: Adequate for Discharge 02/07/2024 1217 by Orson Ape, RN Outcome: Progressing   Problem: Education: Goal: Knowledge of General Education information will improve Description: Including pain rating scale, medication(s)/side effects and non-pharmacologic comfort measures 02/07/2024 1230 by Orson Ape, RN Outcome: Adequate for Discharge 02/07/2024 1217 by Orson Ape, RN Outcome: Progressing   Problem: Health Behavior/Discharge Planning: Goal: Ability to manage health-related needs will improve 02/07/2024 1230 by Orson Ape, RN Outcome: Adequate for Discharge 02/07/2024 1217 by Orson Ape, RN Outcome: Progressing   Problem: Clinical Measurements: Goal: Ability to maintain clinical measurements within normal limits will improve 02/07/2024 1230 by Orson Ape, RN Outcome: Adequate for Discharge 02/07/2024 1217 by Orson Ape, RN Outcome: Progressing Goal: Will remain free from infection 02/07/2024 1230 by Orson Ape, RN Outcome: Adequate for Discharge 02/07/2024 1217 by Orson Ape, RN Outcome: Progressing Goal: Diagnostic test results will improve 02/07/2024 1230 by Orson Ape, RN Outcome: Adequate for Discharge 02/07/2024 1217 by Orson Ape, RN Outcome: Progressing Goal: Respiratory complications will improve 02/07/2024 1230 by Orson Ape, RN Outcome: Adequate for Discharge 02/07/2024 1217 by Orson Ape, RN Outcome: Progressing Goal: Cardiovascular complication will be avoided 02/07/2024 1230 by Orson Ape, RN Outcome: Adequate for Discharge 02/07/2024 1217 by Orson Ape, RN Outcome: Progressing   Problem:  Activity: Goal: Risk for activity intolerance will decrease 02/07/2024 1230 by Orson Ape, RN Outcome: Adequate for Discharge 02/07/2024 1217 by Orson Ape, RN Outcome: Progressing   Problem: Nutrition: Goal: Adequate nutrition will be maintained 02/07/2024 1230 by Orson Ape, RN Outcome: Adequate for Discharge 02/07/2024 1217 by Orson Ape, RN Outcome: Progressing   Problem: Coping: Goal: Level of anxiety will decrease 02/07/2024 1230 by Orson Ape, RN Outcome: Adequate for Discharge 02/07/2024 1217 by  Orson Ape, RN Outcome: Progressing   Problem: Elimination: Goal: Will not experience complications related to bowel motility 02/07/2024 1230 by Orson Ape, RN Outcome: Adequate for Discharge 02/07/2024 1217 by Orson Ape, RN Outcome: Progressing Goal: Will not experience complications related to urinary retention 02/07/2024 1230 by Orson Ape, RN Outcome: Adequate for Discharge 02/07/2024 1217 by Orson Ape, RN Outcome: Progressing   Problem: Pain Managment: Goal: General experience of comfort will improve and/or be controlled 02/07/2024 1230 by Orson Ape, RN Outcome: Adequate for Discharge 02/07/2024 1217 by Orson Ape, RN Outcome: Progressing   Problem: Safety: Goal: Ability to remain free from injury will improve 02/07/2024 1230 by Orson Ape, RN Outcome: Adequate for Discharge 02/07/2024 1217 by Orson Ape, RN Outcome: Progressing   Problem: Skin Integrity: Goal: Risk for impaired skin integrity will decrease 02/07/2024 1230 by Orson Ape, RN Outcome: Adequate for Discharge 02/07/2024 1217 by Orson Ape, RN Outcome: Progressing

## 2024-02-07 NOTE — Progress Notes (Signed)
 AVS completed and placed with patient chart.

## 2024-02-07 NOTE — Discharge Summary (Addendum)
 Family Medicine Teaching [facility] Discharge Summary  Patient name: [name] Medical record number: [id number] Date of birth: 1941-10-09 Age: 83 y.o. Gender: female Date of Admission: 01/29/2024  Date of Discharge: 02/07/2024 Admitting Physician: Para March, DO  Primary Care Provider: Evette Georges, MD Consultants: Cardiology (HF), Infectious Disease, Wound Care  Indication for Hospitalization: Sepsis, RLE wound  Discharge Diagnoses/Problem List:  Principal Problem for Admission: Sepsis Other Problems addressed during stay:  Principal Problem:   CHF (congestive heart failure) (HCC) Active Problems:   CKD (chronic kidney disease), stage IV (HCC)   Acute on chronic combined systolic and diastolic CHF (congestive heart failure) (HCC)   Cellulitis of right leg   Generalized weakness   Type 2 diabetes mellitus (HCC)   Chronic health problem   Bilateral lower extremity edema   Hx of CABG   Long term (current) use of anticoagulants   Atherosclerosis of native coronary artery of native heart without angina pectoris   PVC (premature ventricular contraction)   NSVT (nonsustained ventricular tachycardia) (HCC)   Pressure injury of skin   Elevated BUN   Brief Hospital Course:  [name] is a 83 y.o.female with a history of CAD, CKD 4, hypertension, A-fib, restless leg syndrome, intertrigo, CVA, OSA, diabetes, CHF who was admitted to the family medicine teaching Service at [facility] for generalized weakness. Her hospital course is detailed below:  RLE Cellulitis Seen by Dr. Phineas Real in clinic on 3/19 and discharged with course of doxycycline for cellulitis of right lower extremity.  Patient's pain and erythema of right lower extremity progressed after leaving clinic.  On arrival to ED patient had leukocytosis, elevated lactic acid, and obviously worsened cellulitis of the right lower extremity with concern for sepsis.  Lactic acid trended down with IVF.  Blood cultures with no  growth.  Patient was started on vancomycin 3/20.  Wound care consulted who provided recommendations as below.  UTI Patient had urinary frequency and urgency x 2 days prior to admission.  Urinalysis in the ED showed large hemoglobin, large leukocytes, positive nitrates.  Patient received IV Ceftriaxone (3/19-3/23) and was transitioned to cephalexin for a total 7 day course (3/20-3/26), which was completed prior to discharge.  HFrEF exacerbation Concern for worsening BLE swelling by patient.  Home Spironolactone, Coreg, Torsemide were held initially due to hypotension. She did receive 1.5 L fluid bolus due to hypotension in setting of sepsis, resulting in iatrogenic CHF exacerbation.  Heart failure team consulted who recommended diuresis with IV Lasix 80mg .  Echo showed LVEF 35-40% with left ventricle global hypokinesis; mild mitral valve regurgitation.  Cardiology noted this may be due to PVC burden, which was up to 19% on monitor and and metoprolol succinate 25 mg daily was started, after which PVC burden decreased to 6%.  Briefly required O2, but was stable on RA at discharge.  Torsemide 80 mg BID was started prior to discharge.  Hyperkalemia Pt found to be hyperkalemic >7.5 on admission.  Suspected to be in the setting of excessive PO potassium supplementation at home. Pt never had EKG changes from her hyperkalemia.  She received multiple rounds of Lokelma, sodium bicarb, and insulin/D50 over the course of 24 hours.  By the time of discharge her potassium had normalized for multiple days.  Generalized weakness Presented initially with generalized weakness x several months but worsening in the last day or so.  Suspected to be in the setting of acute infections (cellulitis and UTI).  PT/OT saw and evaluated pt and  recommended SNF.  Weakness improved by discharge.  Other chronic conditions were medically managed with home medications and formulary alternatives as necessary (vaginal bleeding, CVA,  intertrigo, RLS, afib, T2DM).  PCP Follow-up Recommendations: Discontinued potassium and spironolactone on discharge given hyperkalemia, recheck BMP in 1 week to ensure stable K. Outpatient follow up with Cardiology (Dr. Freida Busman), considering CardioMEMS implantation. Please ensure wound care of BLE wounds with daily dressing changes: Recommend Vashe wound cleanser, allow to air dry, then apply Xerofrom gauze followed by Kerlix for light compression.  Disposition: SNF  Discharge Condition: Stable, euvolemic  Discharge Exam:  Vitals:   02/07/24 0348 02/07/24 0807  BP: (!) 116/45 (!) 117/47  Pulse: 70 68  Resp: 18 19  Temp: 97.7 F (36.5 C) 98.4 F (36.9 C)  SpO2: 100% 100%   Physical Exam: General: Deconditioned, resting comfortably in bed, NAD, alert and at baseline. Cardiovascular: JVP 6 cm below angel of jaw. Irregular rate and rhythm. Normal S1/S2. No murmurs, rubs, or gallops appreciated. 2+ radial pulses. Pulmonary: Clear bilaterally to auscultation; no wheezes, crackles, or rhonchi. Normal WOB on room air, no accessory muscle usage. Abdominal: Normoactive bowel sounds, nondistended. No tenderness to deep or light palpation. No rebound or guarding. Skin: Warm and dry. Extremities: Trace peripheral edema bilaterally. BLE wrapped, minimal erythema of RLE. Capillary refill <2 seconds.  Significant Procedures: None  Significant Labs and Imaging:  Recent Labs  Lab 02/06/24 1201 02/07/24 0330  WBC  --  5.4  HGB 9.3* 9.0*  HCT 28.8* 27.5*  PLT  --  223   Recent Labs  Lab 02/06/24 0359 02/07/24 0330  NA 133* 134*  K 3.7 4.1  CL 105 105  CO2 18* 20*  GLUCOSE 199* 159*  BUN 70* 70*  CREATININE 1.85* 1.83*  CALCIUM 9.0 8.9  MG 2.2 2.2   - BCx: Negative - Urine cultures: E. coli and Proteus sensitive to CTX  Results/Tests Pending at Time of Discharge: None  Discharge Medications:  Allergies as of 02/07/2024       Reactions   Benazepril Other (See Comments)    Unknown reaction at age 66-65 - possibly dizziness   Cymbalta [duloxetine Hcl]    Itch, Dry mouth    Ozempic (0.25 Or 0.5 Mg-dose) [semaglutide(0.25 Or 0.5mg -dos)] Diarrhea   Gi intollerance   Tape    TOLERATES PAPER TAPE ONLY- due to thin skin. NOT ALLERGY PER PT   Angiotensin Receptor Blockers Other (See Comments)   Hypotension reaction   Bactrim [sulfamethoxazole-trimethoprim] Other (See Comments)   Unknown reaction   Fe-succ-c-thre-b12-des Stomach Other (See Comments)   Unknown reaction Ferocon?        Medication List     STOP taking these medications    carvedilol 3.125 MG tablet Commonly known as: COREG   doxycycline 100 MG capsule Commonly known as: VIBRAMYCIN   Furoscix 80 MG/10ML Ctkt Generic drug: Furosemide   metolazone 2.5 MG tablet Commonly known as: ZAROXOLYN   spironolactone 25 MG tablet Commonly known as: ALDACTONE       TAKE these medications    acetaminophen 650 MG CR tablet Commonly known as: Tylenol 8 Hour Take 1 tablet (650 mg total) by mouth every 8 (eight) hours as needed for pain.   apixaban 2.5 MG Tabs tablet Commonly known as: Eliquis Take 1 tablet (2.5 mg total) by mouth 2 (two) times daily.   atorvastatin 40 MG tablet Commonly known as: LIPITOR Take 1 tablet (40 mg total) by mouth daily.   brimonidine 0.2 %  ophthalmic solution Commonly known as: ALPHAGAN Place 1 drop into both eyes 2 (two) times daily.   Cholecalciferol 25 MCG (1000 UT) tablet Take 1,000 Units by mouth daily.   clotrimazole 1 % cream Commonly known as: Lotrimin AF Apply 1 Application topically 2 (two) times daily.   cyanocobalamin 1000 MCG tablet Commonly known as: VITAMIN B12 Take 1 tablet (1,000 mcg total) by mouth daily.   diclofenac Sodium 1 % Gel Commonly known as: Voltaren Apply 2 g topically as needed.   docusate sodium 100 MG capsule Commonly known as: COLACE Take 200 mg by mouth at bedtime.   ferrous sulfate 325 (65 FE) MG tablet TAKE  1 TABLET BY MOUTH EVERY DAY WITH BREAKFAST   lidocaine 5 % Commonly known as: Lidoderm Place 1 patch onto the skin daily. Remove & Discard patch within 12 hours or as directed by MD What changed:  when to take this reasons to take this   megestrol 40 MG tablet Commonly known as: MEGACE Take 1 tablet (40 mg total) by mouth daily.   metoprolol succinate 25 MG 24 hr tablet Commonly known as: TOPROL-XL Take 1 tablet (25 mg total) by mouth daily. Start taking on: February 08, 2024   nitroGLYCERIN 0.4 MG SL tablet Commonly known as: NITROSTAT Place 1 tablet (0.4 mg total) under the tongue every 5 (five) minutes as needed for chest pain (up to 3 doses).   nystatin powder Commonly known as: MYCOSTATIN/NYSTOP Apply 1 Application topically 3 (three) times daily. Until resolution.   ONE TOUCH ULTRA 2 w/Device Kit USE TO CHECK BLOOD SUGAR UPTO 3 TIMES A DAY   ONE TOUCH ULTRA TEST test strip Generic drug: glucose blood Use three times a day   glucose blood test strip Use as instructed to check blood glucose up to 4x daily   OneTouch Delica Plus Lancet33G Misc Please use to check blood sugar up to four times daily.   polyethylene glycol 17 g packet Commonly known as: MIRALAX / GLYCOLAX Take 17 g by mouth 2 (two) times daily.   potassium chloride SA 20 MEQ tablet Commonly known as: KLOR-CON M Take 3 tablets (60 mEq total) by mouth 2 (two) times daily.   PRESERVISION AREDS 2+MULTI VIT PO Take 1 capsule by mouth in the morning and at bedtime.   rOPINIRole 0.5 MG tablet Commonly known as: REQUIP Take 2 tablets (1 mg total) by mouth at bedtime.   senna 8.6 MG Tabs tablet Commonly known as: SENOKOT Take 1 tablet (8.6 mg total) by mouth 2 (two) times daily.   Torsemide 40 MG Tabs Take 80 mg by mouth 2 (two) times daily. What changed:  medication strength how much to take   traMADol 50 MG tablet Commonly known as: ULTRAM Take 1 tablet (50 mg total) by mouth every 12 (twelve)  hours as needed for severe pain (pain score 7-10) (Can take every 6 hours for breakthrough pain.).   Trulicity 1.5 MG/0.5ML Soaj Generic drug: Dulaglutide Inject 1.5 mg into the skin once a week.   zinc oxide 20 % ointment Apply 1 application topically as needed (buttocks irritation).               Discharge Care Instructions  (From admission, onward)           Start     Ordered   02/07/24 0000  Discharge wound care:       Comments: Cleanse B lower legs (intact skin and ulcerations) with Vashe wound cleanser [place] [phone])  do not rinse, allow to air dry. Apply Xeroform gauze [place] [phone]) to wound beds daily, cover with ABD pads and Kerlix roll gauze beginning right above toes and ending right below knees.  May secure dressing with Ace bandage wrapped in same fashion as Kerlix for some light compression. Patient should elevate legs as much as possible.  Additionally, apply daily barrier ointment to stage 1 sacral ulcer to prevent skin breakdown.   02/07/24 1158            Discharge Instructions: Please refer to Patient Instructions section of EMR for full details.  Patient was counseled important signs and symptoms that should prompt return to medical care, changes in medications, dietary instructions, activity restrictions, and follow up appointments.   Follow-Up Appointments: Future Appointments  Date Time Provider Department Center  02/19/2024  9:45 AM Clerance Lav, DPM [facility]  05/07/2024 10:40 AM [facility] ANNUAL WELLNESS VISIT [facility]    Nelia Shi, MD 02/07/2024, 11:58 AM PGY-1, [facility]

## 2024-02-07 NOTE — Assessment & Plan Note (Deleted)
 Urine cultures grew pansensitive E. coli, and Proteus sensitive to CTX, now s/p treatment. -PureWick ordered -s/p Cephalexin 500 mg BID x7 days

## 2024-02-07 NOTE — Plan of Care (Signed)

## 2024-02-07 NOTE — Assessment & Plan Note (Deleted)
 CAD - s/p CABG in 2009 with 2 stents, continue atorvastatin 40 mg daily CKD IV - Monitor Cr Hypertension - See CHF above A-fib - Continue Eliquis 2.5 mg BID (renally dosed), cardiac telemetry Restless leg syndrome - Continue Requip 1 mg at bedtime  Intertrigo - Continue nystatin powder to gluteal folds Hx CVA - Continue Eliquis OSA - Does not use CPAP

## 2024-02-07 NOTE — Progress Notes (Signed)
 Called [facility] twice to give report at 1230 and 1235. Left message and call back number. Awaiting call back.

## 2024-02-07 NOTE — Assessment & Plan Note (Deleted)
 BUN has trended up multiple days in a row with anemia as of last CBC 3/24 noted.  Given patient's Eliquis and age, she does have risk for GI bleed.  However, Hgb stable.  Iron panel consistent with anemia of chronic disease. -AM CBC

## 2024-02-07 NOTE — Assessment & Plan Note (Deleted)
 Asymptomatic with intermittent PVCs on tele.  PVC burden decreased to 6.26% from 17% after starting metoprolol. -Appreciate Cardiology recs:  -Start metoprolol 25 mg daily

## 2024-02-07 NOTE — Plan of Care (Signed)
  Problem: Coping: Goal: Ability to adjust to condition or change in health will improve Outcome: Progressing   Problem: Nutritional: Goal: Maintenance of adequate nutrition will improve Outcome: Progressing   Problem: Education: Goal: Knowledge of General Education information will improve Description: Including pain rating scale, medication(s)/side effects and non-pharmacologic comfort measures Outcome: Progressing   Problem: Clinical Measurements: Goal: Respiratory complications will improve Outcome: Progressing Goal: Cardiovascular complication will be avoided Outcome: Progressing   Problem: Clinical Measurements: Goal: Cardiovascular complication will be avoided Outcome: Progressing

## 2024-02-07 NOTE — TOC Progression Note (Addendum)
 Transition of Care [facility]) - Progression Note    Patient Details  Name: [name] MRN: [id number] Date of Birth: Oct 29, 1941  Transition of Care [facility]) CM/SW Contact  Eduard Roux, [place] Phone Number: 02/07/2024, 11:28 AM  Clinical Narrative:     11:00 am Received message from [facility]- she received call from devoted Rep Dorene Sorrow who states SNF has been approved starting 3/28-4/4 auth # [id number].   11:31 am [name] - confirmed they have received auth and and admit patient today.   Antony Blackbird, MSW, LCSW Clinical Social Worker    Expected Discharge Plan: Skilled Nursing Facility Barriers to Discharge: Continued Medical Work up, SNF Pending bed offer, Insurance Authorization  Expected Discharge Plan and Services In-house Referral: Clinical Social Work     Living arrangements for the past 2 months: Single Family Home                                       Social Determinants of Health (SDOH) Interventions SDOH Screenings   Food Insecurity: No Food Insecurity (01/30/2024)  Housing: Low Risk  (01/30/2024)  Transportation Needs: No Transportation Needs (01/30/2024)  Utilities: Not At Risk (01/30/2024)  Alcohol Screen: Low Risk  (10/23/2023)  Depression (PHQ2-9): High Risk (01/29/2024)  Financial Resource Strain: Low Risk  (05/06/2023)  Physical Activity: Inactive (09/24/2023)  Social Connections: Socially Isolated (01/30/2024)  Stress: No Stress Concern Present (05/06/2023)  Tobacco Use: Low Risk  (01/30/2024)  Health Literacy: Adequate Health Literacy (09/24/2023)    Readmission Risk Interventions     No data to display

## 2024-02-07 NOTE — TOC Transition Note (Signed)
 Transition of Care [facility]) - Discharge Note   Patient Details  Name: [name] MRN: [id number] Date of Birth: March 06, 1941  Transition of Care [facility]) CM/SW Contact:  Eduard Roux, LCSW Phone Number: 02/07/2024, 12:07 PM   Clinical Narrative:     Patient will Discharge to: [facility] Discharge Date: 02/07/24 Family Notified: son Transport By: Sharin Mons  Per MD patient is ready for discharge. RN, patient, and facility notified of discharge. Discharge Summary sent to facility. RN given number for [phone]. Ambulance transport requested for patient.   Clinical Social Worker signing off.  Antony Blackbird, MSW, LCSW Clinical Social Worker     Final next level of care: Skilled Nursing Facility Barriers to Discharge: Barriers Resolved   Patient Goals and CMS Choice Patient states their goals for this hospitalization and ongoing recovery are:: To get better          Discharge Placement              [name]) Patient to be transferred to facility by: [facility] Name of family member notified: son Patient and family notified of of transfer: 02/07/24  Discharge Plan and Services Additional resources added to the After Visit Summary for   In-house Referral: Clinical Social Work                                   Social Drivers of Health (SDOH) Interventions SDOH Screenings   Food Insecurity: No Food Insecurity (01/30/2024)  Housing: Low Risk  (01/30/2024)  Transportation Needs: No Transportation Needs (01/30/2024)  Utilities: Not At Risk (01/30/2024)  Alcohol Screen: Low Risk  (10/23/2023)  Depression (PHQ2-9): High Risk (01/29/2024)  Financial Resource Strain: Low Risk  (05/06/2023)  Physical Activity: Inactive (09/24/2023)  Social Connections: Socially Isolated (01/30/2024)  Stress: No Stress Concern Present (05/06/2023)  Tobacco Use: Low Risk  (01/30/2024)  Health Literacy: Adequate Health Literacy (09/24/2023)      Readmission Risk Interventions     No data to display

## 2024-02-07 NOTE — Progress Notes (Signed)
 Physical Therapy Treatment Patient Details Name: [name] MRN: [id number] DOB: [birth year] Today's Date: 02/07/2024   History of Present Illness Pt is 83 yo presenting to [facility] ED on 3/19 due to generalized weakness and BLE swelling. Pt with sepsis 2/2 cellulitis. PMH: CHF, CKD 4, DM II, diabetic neuropathy, GERD, CVA 2018, Afib on eliquis, OSA and thrombocytopenia, macular degeneration, CAD, venous stasis    PT Comments  Pt resting in bed on arrival and agreeable to session with slow but steady progress towards acute goals as pt reporting increased pain in RLE due to prolonged standing for weight date prior. Pt continues to require max-total A for bed mobility and mod A to boost to stand form elevated EOB. Pt able to maintain standing ~3 mins, requiring total A for peri-care in standing. Pt able take steps along EOB to [facility] with min A to maintain balance with improvement in RLE clearance noted this session. Patient will benefit from continued inpatient follow up therapy, <3 hours/day, will continue to follow acutely.     If plan is discharge home, recommend the following: A lot of help with walking and/or transfers;A lot of help with bathing/dressing/bathroom;Assist for transportation;Help with stairs or ramp for entrance;Assistance with cooking/housework   Can travel by private vehicle     No  Equipment Recommendations  None recommended by PT    Recommendations for Other Services       Precautions / Restrictions Precautions Precautions: Fall Restrictions Weight Bearing Restrictions Per Provider Order: No     Mobility  Bed Mobility Overal bed mobility: Needs Assistance Bed Mobility: Supine to Sit, Sit to Supine     Supine to sit: +2 for physical assistance, Max assist Sit to supine: +2 for physical assistance, Total assist   General bed mobility comments: pt initiating LEs toward EOB, assist to raise trunk and advance hips to EOB with bed pad, assist for all aspect returning  to supine    Transfers Overall transfer level: Needs assistance Equipment used: Rolling walker (2 wheels) Transfers: Sit to/from Stand, Bed to chair/wheelchair/BSC Sit to Stand: From elevated surface, Mod assist           General transfer comment: assist from elevated bed, simulating height of lift chair at home, mod A to boost to stand,    Ambulation/Gait Ambulation/Gait assistance: Mod assist Gait Distance (Feet): 3 Feet Assistive device: Rolling walker (2 wheels) Gait Pattern/deviations: Step-to pattern       General Gait Details: trunk significantly flexed throughout with pt unable to correct, low effortful steps slong EOB to [facility]   Stairs             Wheelchair Mobility     Tilt Bed    Modified Rankin (Stroke Patients Only)       Balance Overall balance assessment: Needs assistance Sitting-balance support: Feet supported, Bilateral upper extremity supported Sitting balance-Leahy Scale: Fair Sitting balance - Comments: close guard   Standing balance support: Bilateral upper extremity supported Standing balance-Leahy Scale: Poor Standing balance comment: reliant on RW and external support                            Communication Communication Communication: No apparent difficulties  Cognition Arousal: Alert Behavior During Therapy: Anxious   PT - Cognitive impairments: No apparent impairments                       PT - Cognition Comments: fearful  of falling Following commands: Impaired Following commands impaired: Follows one step commands with increased time    Cueing Cueing Techniques: Verbal cues, Gestural cues  Exercises      General Comments General comments (skin integrity, edema, etc.): son present and supportive, pt with noted bowel movement once in standing, total A needed for peri-care in standing      Pertinent Vitals/Pain Pain Assessment Pain Assessment: Faces Faces Pain Scale: Hurts little more Pain  Location: LEs R>L Pain Descriptors / Indicators: Grimacing, Guarding, Discomfort Pain Intervention(s): Monitored during session, Limited activity within patient's tolerance    Home Living                          Prior Function            PT Goals (current goals can now be found in the care plan section) Acute Rehab PT Goals Patient Stated Goal: to get stronger PT Goal Formulation: With patient/family Time For Goal Achievement: 02/14/24 Progress towards PT goals: Progressing toward goals    Frequency    Min 2X/week      PT Plan      Co-evaluation              AM-PAC PT "6 Clicks" Mobility   Outcome Measure  Help needed turning from your back to your side while in a flat bed without using bedrails?: Total Help needed moving from lying on your back to sitting on the side of a flat bed without using bedrails?: Total Help needed moving to and from a bed to a chair (including a wheelchair)?: Total Help needed standing up from a chair using your arms (e.g., wheelchair or bedside chair)?: Total Help needed to walk in hospital room?: Total Help needed climbing 3-5 steps with a railing? : Total 6 Click Score: 6    End of Session Equipment Utilized During Treatment: Gait belt Activity Tolerance: Patient limited by fatigue;Patient limited by pain Patient left: in bed;with call bell/phone within reach;with family/visitor present;Other (comment) (with lunch tray set up) Nurse Communication: Mobility status PT Visit Diagnosis: Unsteadiness on feet (R26.81);Other abnormalities of gait and mobility (R26.89);Muscle weakness (generalized) (M62.81)     Time: 1610-9604 PT Time Calculation (min) (ACUTE ONLY): 26 min  Charges:    $Therapeutic Activity: 23-37 mins PT General Charges $$ ACUTE PT VISIT: 1 Visit                     Miraj Truss R. PTA Acute Rehabilitation Services Office: [phone]   Catalina Antigua 02/07/2024, 12:12 PM

## 2024-02-09 DIAGNOSIS — L98429 Non-pressure chronic ulcer of back with unspecified severity: Secondary | ICD-10-CM | POA: Diagnosis not present

## 2024-02-09 DIAGNOSIS — E1169 Type 2 diabetes mellitus with other specified complication: Secondary | ICD-10-CM | POA: Diagnosis not present

## 2024-02-09 DIAGNOSIS — I493 Ventricular premature depolarization: Secondary | ICD-10-CM | POA: Diagnosis not present

## 2024-02-09 DIAGNOSIS — R5381 Other malaise: Secondary | ICD-10-CM | POA: Diagnosis not present

## 2024-02-09 DIAGNOSIS — I872 Venous insufficiency (chronic) (peripheral): Secondary | ICD-10-CM | POA: Diagnosis not present

## 2024-02-09 DIAGNOSIS — Z9189 Other specified personal risk factors, not elsewhere classified: Secondary | ICD-10-CM | POA: Diagnosis not present

## 2024-02-09 DIAGNOSIS — E1122 Type 2 diabetes mellitus with diabetic chronic kidney disease: Secondary | ICD-10-CM | POA: Diagnosis not present

## 2024-02-09 DIAGNOSIS — E669 Obesity, unspecified: Secondary | ICD-10-CM | POA: Diagnosis not present

## 2024-02-09 DIAGNOSIS — Z8619 Personal history of other infectious and parasitic diseases: Secondary | ICD-10-CM | POA: Diagnosis not present

## 2024-02-09 DIAGNOSIS — I5020 Unspecified systolic (congestive) heart failure: Secondary | ICD-10-CM | POA: Diagnosis not present

## 2024-02-09 DIAGNOSIS — G3184 Mild cognitive impairment, so stated: Secondary | ICD-10-CM | POA: Diagnosis not present

## 2024-02-09 DIAGNOSIS — N184 Chronic kidney disease, stage 4 (severe): Secondary | ICD-10-CM | POA: Diagnosis not present

## 2024-02-10 ENCOUNTER — Other Ambulatory Visit (HOSPITAL_COMMUNITY): Payer: Self-pay | Admitting: Cardiology

## 2024-02-10 MED ORDER — EMPAGLIFLOZIN 10 MG PO TABS: 10.0000 mg | ORAL_TABLET | Freq: Every day | ORAL | 0 refills | Status: DC

## 2024-02-12 ENCOUNTER — Telehealth: Payer: Self-pay | Admitting: *Deleted

## 2024-02-12 NOTE — Progress Notes (Unsigned)
 Complex Care Management Care Guide Note  02/12/2024 Name: [name] MRN: [id number] DOB: 1941/07/31  [name] is a 83 y.o. year old female who is a primary care patient of Mabe, Earvin Hansen, MD and is actively engaged with the care management team. I reached out to [name] by phone today to assist with re-scheduling  with the RN Case Manager.  Follow up plan: Unsuccessful telephone outreach attempt made. A HIPAA compliant phone message was left for the patient providing contact information and requesting a return call.  Gwenevere Ghazi  [facility]  [facility], [facility] Guide  Direct Dial: [phone]  Fax [phone]

## 2024-02-14 NOTE — Progress Notes (Signed)
 Complex Care Management Care Guide Note  02/14/2024 Name: STANA BAYON MRN: [id number] DOB: [birth year]  [name] is a 83 y.o. year old female who is a primary care patient of Mabe, Earvin Hansen, MD and is actively engaged with the care management team. I reached out to [name] by phone today to assist with re-scheduling  with the RN Case Manager.  Follow up plan: Unsuccessful telephone outreach attempt made. A HIPAA compliant phone message was left for the patient providing contact information and requesting a return call.No further outreach attempts will be made at this time. We have been unable to contact the patient to reschedule for complex care management services.   Gwenevere Ghazi  [facility]  [facility], [facility] Guide  Direct Dial: [phone]  Fax [phone]

## 2024-02-19 ENCOUNTER — Ambulatory Visit: Payer: No Typology Code available for payment source | Admitting: Podiatry

## 2024-02-19 DIAGNOSIS — Z6835 Body mass index (BMI) 35.0-35.9, adult: Secondary | ICD-10-CM | POA: Diagnosis not present

## 2024-02-19 DIAGNOSIS — E669 Obesity, unspecified: Secondary | ICD-10-CM | POA: Diagnosis not present

## 2024-02-19 DIAGNOSIS — I5022 Chronic systolic (congestive) heart failure: Secondary | ICD-10-CM | POA: Diagnosis not present

## 2024-02-19 DIAGNOSIS — I10 Essential (primary) hypertension: Secondary | ICD-10-CM | POA: Diagnosis not present

## 2024-02-19 DIAGNOSIS — N184 Chronic kidney disease, stage 4 (severe): Secondary | ICD-10-CM | POA: Diagnosis not present

## 2024-02-25 ENCOUNTER — Telehealth (HOSPITAL_COMMUNITY): Payer: Self-pay | Admitting: *Deleted

## 2024-02-25 NOTE — Telephone Encounter (Signed)
 Called to confirm/remind patient of their appointment at the [facility] on 02/14/24.    Appointment:              [] Confirmed             [x] Left mess              [] No answer/No voice mail             [] Phone not in service   Patient reminded to bring all medications and/or complete list.   Confirmed patient has transportation. Gave directions, instructed to utilize valet parking.

## 2024-02-26 ENCOUNTER — Ambulatory Visit (HOSPITAL_COMMUNITY)
Admission: RE | Admit: 2024-02-26 | Discharge: 2024-02-26 | Disposition: A | Discharge status: Home / Self Care | Source: Ambulatory Visit | Attending: Cardiology | Admitting: Cardiology

## 2024-02-26 ENCOUNTER — Other Ambulatory Visit (HOSPITAL_COMMUNITY): Payer: Self-pay | Admitting: Cardiology

## 2024-02-26 ENCOUNTER — Ambulatory Visit (HOSPITAL_COMMUNITY)
Admit: 2024-02-26 | Discharge: 2024-02-26 | Disposition: A | Discharge status: Home / Self Care | Source: Ambulatory Visit | Attending: Cardiology | Admitting: Cardiology

## 2024-02-26 ENCOUNTER — Telehealth (HOSPITAL_COMMUNITY): Payer: Self-pay

## 2024-02-26 VITALS — BP 112/66 | HR 76

## 2024-02-26 DIAGNOSIS — I2510 Atherosclerotic heart disease of native coronary artery without angina pectoris: Secondary | ICD-10-CM | POA: Insufficient documentation

## 2024-02-26 DIAGNOSIS — Z7984 Long term (current) use of oral hypoglycemic drugs: Secondary | ICD-10-CM | POA: Diagnosis not present

## 2024-02-26 DIAGNOSIS — I255 Ischemic cardiomyopathy: Secondary | ICD-10-CM | POA: Insufficient documentation

## 2024-02-26 DIAGNOSIS — I5082 Biventricular heart failure: Secondary | ICD-10-CM | POA: Insufficient documentation

## 2024-02-26 DIAGNOSIS — Z7901 Long term (current) use of anticoagulants: Secondary | ICD-10-CM | POA: Insufficient documentation

## 2024-02-26 DIAGNOSIS — Z8673 Personal history of transient ischemic attack (TIA), and cerebral infarction without residual deficits: Secondary | ICD-10-CM | POA: Insufficient documentation

## 2024-02-26 DIAGNOSIS — I130 Hypertensive heart and chronic kidney disease with heart failure and stage 1 through stage 4 chronic kidney disease, or unspecified chronic kidney disease: Secondary | ICD-10-CM | POA: Diagnosis not present

## 2024-02-26 DIAGNOSIS — Z79899 Other long term (current) drug therapy: Secondary | ICD-10-CM | POA: Diagnosis not present

## 2024-02-26 DIAGNOSIS — Z7985 Long-term (current) use of injectable non-insulin antidiabetic drugs: Secondary | ICD-10-CM | POA: Insufficient documentation

## 2024-02-26 DIAGNOSIS — I482 Chronic atrial fibrillation, unspecified: Secondary | ICD-10-CM | POA: Diagnosis not present

## 2024-02-26 DIAGNOSIS — I493 Ventricular premature depolarization: Secondary | ICD-10-CM | POA: Insufficient documentation

## 2024-02-26 DIAGNOSIS — E1122 Type 2 diabetes mellitus with diabetic chronic kidney disease: Secondary | ICD-10-CM | POA: Insufficient documentation

## 2024-02-26 DIAGNOSIS — I5022 Chronic systolic (congestive) heart failure: Secondary | ICD-10-CM | POA: Diagnosis not present

## 2024-02-26 DIAGNOSIS — N183 Chronic kidney disease, stage 3 unspecified: Secondary | ICD-10-CM | POA: Insufficient documentation

## 2024-02-26 DIAGNOSIS — G4733 Obstructive sleep apnea (adult) (pediatric): Secondary | ICD-10-CM | POA: Diagnosis not present

## 2024-02-26 DIAGNOSIS — E1151 Type 2 diabetes mellitus with diabetic peripheral angiopathy without gangrene: Secondary | ICD-10-CM | POA: Insufficient documentation

## 2024-02-26 DIAGNOSIS — I5023 Acute on chronic systolic (congestive) heart failure: Secondary | ICD-10-CM | POA: Diagnosis not present

## 2024-02-26 DIAGNOSIS — I272 Pulmonary hypertension, unspecified: Secondary | ICD-10-CM | POA: Insufficient documentation

## 2024-02-26 DIAGNOSIS — Z951 Presence of aortocoronary bypass graft: Secondary | ICD-10-CM | POA: Insufficient documentation

## 2024-02-26 LAB — BRAIN NATRIURETIC PEPTIDE: B Natriuretic Peptide: 159.1 pg/mL — ABNORMAL HIGH (ref 0.0–100.0)

## 2024-02-26 LAB — BASIC METABOLIC PANEL WITH GFR
Anion gap: 11 (ref 5–15)
BUN: 45 mg/dL — ABNORMAL HIGH (ref 8–23)
CO2: 25 mmol/L (ref 22–32)
Calcium: 9.9 mg/dL (ref 8.9–10.3)
Chloride: 105 mmol/L (ref 98–111)
Creatinine, Ser: 1.58 mg/dL — ABNORMAL HIGH (ref 0.44–1.00)
GFR, Estimated: 32 mL/min — ABNORMAL LOW (ref >60)
Glucose, Bld: 202 mg/dL — ABNORMAL HIGH (ref 70–99)
Potassium: 3.7 mmol/L (ref 3.5–5.1)
Sodium: 141 mmol/L (ref 135–145)

## 2024-02-26 MED ORDER — POTASSIUM CHLORIDE CRYS ER 20 MEQ PO TBCR
20.0000 meq | EXTENDED_RELEASE_TABLET | Freq: Every day | ORAL | Status: DC
Start: 2024-02-26 — End: 2024-06-17

## 2024-02-26 MED ORDER — METOLAZONE 2.5 MG PO TABS: 2.5000 mg | ORAL_TABLET | Freq: Every day | ORAL | 1 refills | Status: DC

## 2024-02-26 MED ORDER — POTASSIUM CHLORIDE CRYS ER 20 MEQ PO TBCR: 20.0000 meq | EXTENDED_RELEASE_TABLET | Freq: Two times a day (BID) | ORAL | 1 refills | Status: DC

## 2024-02-26 MED ORDER — TORSEMIDE 40 MG PO TABS: ORAL_TABLET | ORAL | Status: DC

## 2024-02-26 NOTE — Telephone Encounter (Signed)
 Spoke with patient son (okay per DPR)  made him aware of labs and changes he verbalized understanding.   Called and spoke with patients nurse at [facility], made them aware of changes needed. Nurse at facility aware of results and all instructions. New orders faxed over to patients facility. Advised them to call back with concerns.   Medication list updated.

## 2024-02-26 NOTE — Progress Notes (Signed)
 ID:  [name], DOB Jul 26, 1941, MRN [id number]   Provider location: [facility] Type of Visit: Established patient   PCP:  Maury Dus, MD  Primary Cardiologist:  Armanda Magic, MD HF Cardiologist: Dr. Shirlee Latch  Reason for Visit: [facility] F/u, Biventricular Systolic Heart Failure    History of Present Illness: [name] is a 83 y.o. female who has a history of CAD status post CABG 2009, ischemic cardiomyopathy with chronic systolic CHF, persistent atrial fibrillation with stroke in 03/2016 when INR was subtherapeutic, stage III CKD, morbid obesity, pulmonary hypertension, diabetes, GI bleed 03/2017, and severe sleep apnea.    Seen in [facility] clinics 09/10/18 and 09/24/18, both times with volume overload and marked peripheral edema with skin breakdown. Torsemide increased at each visit. Echo in 7/19 showed EF 45-50% with mild RV dilation/mild RV systolic dysfunction and D-shaped interventricular septum.    AKI earlier in 7/20 in setting of excessive torsemide, she had increased dose to 60 qam/40 qpm on her own.  This was cut back to 40 mg daily and losartan and spironolactone were stopped.   Echo in 9/20 showed EF 45% with mildly decreased RV systolic function, PASP 46 mmHg with dilated IVC.  PYP scan in 10/21 was negative.   Echo in 4/22 showed EF 45% with diffuse hypokinesis, mildly decreased RV systolic function, PASP 47, IVC dilated, moderate TR, mild MR.   Echo 3/24 showed EF 50%, mildly decreased RV systolic function, dilated IVC, PASP 47 mmHg.   At AHF follow up 2/25 she was volume overloaded and was given 3 days of furoscix and increased diuretic regimen. Had a new sacral wound so SGLT2i was stopped.   Seen in [facility] on 01/23/24. Was volume overloaded w/ R>L sided HF symptoms. She was continued on torsemide 100 mg bid and instructed to take metolazone daily x 2 days, then return for f/u in 1 wk.   Unfortunately, pt ended up getting admitted for  sepsis 2/2 LE cellulitis, in the setting of chronic venous stasis. Also w/ AKI and hyperkalemia. Spironolactone was discontinued. Treated w/ Lokelma. Started on abx for cellulitis.  Cardiology was asked to assist w/ volume management. She was diuresed w/ IV Lasix. Echo was repeated, showing drop in EF down to 35-40%, RV mildly reduced. IVC was dilated, assumed RAP ~15 mmHg. After diuresis w/ IV Lasix, she was transitioned back to torsemide but at lower dose of 80 mg bid. Kept off of spiro. She was also transitioned from Coreg to metoprolol for suppression of PVCs, noted to have high burden percentage at 17%.   She presents today for post hospital f/u. Here w/ son. In motorized WC. Lives at SNF. C/w marked fluid overload, 2-3+ b/l LEE. Cellulitis resolved. Denies resting dyspnea. Not very active. Able to participate in some PT, mainly transfers to and from chair/bed and leg exercises. Has issues w/ incontinence. Wears depends.   She denies CP. EKG shows rate controlled afib and frequent PVCs, though asymptomatic. HR 75 bpm.    ECG (personally reviewed): afib 75 bpm, frequent PVCs (3)  Review of systems complete and found to be negative unless listed in HPI.    Past Medical History 1. Chronic systolic CHF: Ischemic cardiomyopathy with prominent RV failure.   - Echo (11/18): LVEF 35-40%, severe RV dilation, severe RAE, severe TR.  - Echo (7/19): EF 45-50%, diffuse hypokinesis, mild RV dilation with mildly decreased RV systolic function, D-shaped interventricular septum suggestive of RV pressure/volume overload, mild  MR, moderate TR, PASP 49 mmhg.  - Echo (9/20): EF 45%, mild LV dilation, mild RV dilation with mildly decreased systolic function, PASP 46 mmHg, IVC dilated.  - Labs (9/21): myeloma panel negative, urine immunofixation negative - PYP scan (10/21): Negative.  - Echo (4/22): EF 45% with diffuse hypokinesis, mildly decreased RV systolic function, PASP 47, IVC dilated, moderate TR, mild MR. -  Echo (3/24): EF 50%, mildly decreased RV systolic function, dilated IVC, PASP 47 mmHg.  2. CAD: CABG 2009. No angiography since that time.   3. HTN 4. Atrial fibrillation: Chronic since 2013.  5. CKD stage 3 6. Morbid obesity 7. OSA 8. Chronic venous stasis with RLE wound/Bullae 9. CVA 5/18.   Current Outpatient Medications  Medication Sig Dispense Refill   empagliflozin (JARDIANCE) 10 MG TABS tablet Take 1 tablet (10 mg total) by mouth daily before breakfast. 30 tablet 0   acetaminophen (TYLENOL 8 HOUR) 650 MG CR tablet Take 1 tablet (650 mg total) by mouth every 8 (eight) hours as needed for pain. 30 tablet 0   apixaban (ELIQUIS) 2.5 MG TABS tablet Take 1 tablet (2.5 mg total) by mouth 2 (two) times daily. 180 tablet 3   atorvastatin (LIPITOR) 40 MG tablet Take 1 tablet (40 mg total) by mouth daily. 100 tablet 2   Blood Glucose Monitoring Suppl (ONE TOUCH ULTRA 2) w/Device KIT USE TO CHECK BLOOD SUGAR UPTO 3 TIMES A DAY 1 kit 0   brimonidine (ALPHAGAN) 0.2 % ophthalmic solution Place 1 drop into both eyes 2 (two) times daily.     Cholecalciferol 25 MCG (1000 UT) tablet Take 1,000 Units by mouth daily.     clotrimazole (LOTRIMIN AF) 1 % cream Apply 1 Application topically 2 (two) times daily. 113 g 1   diclofenac Sodium (VOLTAREN) 1 % GEL Apply 2 g topically as needed. 100 g 1   docusate sodium (COLACE) 100 MG capsule Take 200 mg by mouth at bedtime.     Dulaglutide (TRULICITY) 1.5 MG/0.5ML SOAJ Inject 1.5 mg into the skin once a week. 2 mL 0   ferrous sulfate 325 (65 FE) MG tablet TAKE 1 TABLET BY MOUTH EVERY DAY WITH BREAKFAST 90 tablet 1   glucose blood (ONE TOUCH ULTRA TEST) test strip Use three times a day 100 each 3   glucose blood test strip Use as instructed to check blood glucose up to 4x daily 100 each 12   Lancets (ONETOUCH DELICA PLUS LANCET33G) MISC Please use to check blood sugar up to four times daily. 100 each 6   lidocaine (LIDODERM) 5 % Place 1 patch onto the skin daily.  Remove & Discard patch within 12 hours or as directed by MD (Patient taking differently: Place 1 patch onto the skin daily as needed. Remove & Discard patch within 12 hours or as directed by MD) 30 patch 0   megestrol (MEGACE) 40 MG tablet Take 1 tablet (40 mg total) by mouth daily. 30 tablet 0   metoprolol succinate (TOPROL-XL) 25 MG 24 hr tablet Take 1 tablet (25 mg total) by mouth daily.     Multiple Vitamins-Minerals (PRESERVISION AREDS 2+MULTI VIT PO) Take 1 capsule by mouth in the morning and at bedtime.     nitroGLYCERIN (NITROSTAT) 0.4 MG SL tablet Place 1 tablet (0.4 mg total) under the tongue every 5 (five) minutes as needed for chest pain (up to 3 doses). 15 tablet 5   nystatin (MYCOSTATIN/NYSTOP) powder Apply 1 Application topically 3 (three) times daily. Until  resolution. (Patient not taking: Reported on 01/30/2024) 15 g 0   polyethylene glycol (MIRALAX / GLYCOLAX) 17 g packet Take 17 g by mouth 2 (two) times daily.     potassium chloride SA (KLOR-CON M) 20 MEQ tablet Take 3 tablets (60 mEq total) by mouth 2 (two) times daily. 180 tablet 2   rOPINIRole (REQUIP) 0.5 MG tablet Take 2 tablets (1 mg total) by mouth at bedtime. 180 tablet 3   senna (SENOKOT) 8.6 MG TABS tablet Take 1 tablet (8.6 mg total) by mouth 2 (two) times daily.     torsemide 40 MG TABS Take 80 mg by mouth 2 (two) times daily.     traMADol (ULTRAM) 50 MG tablet Take 1 tablet (50 mg total) by mouth every 12 (twelve) hours as needed for severe pain (pain score 7-10) (Can take every 6 hours for breakthrough pain.). 60 tablet 1   vitamin B-12 (CYANOCOBALAMIN) 1000 MCG tablet Take 1 tablet (1,000 mcg total) by mouth daily. 90 tablet 1   zinc oxide 20 % ointment Apply 1 application topically as needed (buttocks irritation). 425 g 1   No current facility-administered medications for this visit.   Allergies  Allergen Reactions   Benazepril Other (See Comments)    Unknown reaction at age 59-65 - possibly dizziness   Cymbalta  [Duloxetine Hcl]     Itch, Dry mouth    Ozempic (0.25 Or 0.5 Mg-Dose) [Semaglutide(0.25 Or 0.5mg -Dos)] Diarrhea    Gi intollerance   Tape     TOLERATES PAPER TAPE ONLY- due to thin skin. NOT ALLERGY PER PT   Angiotensin Receptor Blockers Other (See Comments)    Hypotension reaction   Bactrim [Sulfamethoxazole-Trimethoprim] Other (See Comments)    Unknown reaction   Fe-Succ-C-Thre-B12-Des Stomach Other (See Comments)    Unknown reaction  Ferocon?   Social History   Socioeconomic History   Marital status: Widowed    Spouse name: Not on file   Number of children: 2   Years of education: 13   Highest education level: Not on file  Occupational History    Comment: retired  Tobacco Use   Smoking status: Never    Passive exposure: Never   Smokeless tobacco: Never  Vaping Use   Vaping status: Never Used  Substance and Sexual Activity   Alcohol use: No   Drug use: Never   Sexual activity: Not Currently  Other Topics Concern   Not on file  Social History Narrative   Widowed   01/27/21 Lives with son,  in [place]   2 children   Not routinely exercising   Social Drivers of Health   Financial Resource Strain: Low Risk  (05/06/2023)   Overall Financial Resource Strain (CARDIA)    Difficulty of Paying Living Expenses: Not hard at all  Food Insecurity: No Food Insecurity (01/30/2024)   Hunger Vital Sign    Worried About Running Out of Food in the Last Year: Never true    Ran Out of Food in the Last Year: Never true  Transportation Needs: No Transportation Needs (01/30/2024)   PRAPARE - Administrator, Civil Service (Medical): No    Lack of Transportation (Non-Medical): No  Physical Activity: Inactive (09/24/2023)   Exercise Vital Sign    Days of Exercise per Week: 0 days    Minutes of Exercise per Session: 0 min  Stress: No Stress Concern Present (05/06/2023)   [employer] of Occupational Health - Occupational Stress Questionnaire    Feeling of Stress :  Not  at all  Social Connections: Socially Isolated (01/30/2024)   Social Connection and Isolation Panel [NHANES]    Frequency of Communication with Friends and Family: More than three times a week    Frequency of Social Gatherings with Friends and Family: Once a week    Attends Religious Services: Never    Database administrator or Organizations: No    Attends Banker Meetings: Never    Marital Status: Widowed  Intimate Partner Violence: Not At Risk (01/30/2024)   Humiliation, Afraid, Rape, and Kick questionnaire    Fear of Current or Ex-Partner: No    Emotionally Abused: No    Physically Abused: No    Sexually Abused: No   Family History  Problem Relation Age of Onset   Clotting disorder Mother        Cerebral hemorrhage   Other Mother        cerebral hemorrhage   Emphysema Father        COD   Lung cancer Brother    Heart disease Neg Hx    LMP  (LMP Unknown)   Wt Readings from Last 3 Encounters:  02/07/24 110.4 kg (243 lb 6.2 oz)  01/29/24 98.8 kg (217 lb 12.8 oz)  01/23/24 100.5 kg (221 lb 9.6 oz)    PHYSICAL EXAM: General:  elderly, obese, chronically ill appearing. In WC. No respiratory difficulty HEENT: normal Neck: supple. JVD 10 cm. Carotids 2+ bilat; no bruits. No lymphadenopathy or thyromegaly appreciated. Cor: PMI nondisplaced. Irregularly irregular rhythm and rate. No rubs, gallops or murmurs. Lungs: decreased BS at the bases Abdomen: soft, nontender, nondistended. No hepatosplenomegaly. No bruits or masses. Good bowel sounds. Extremities: no cyanosis, clubbing, rash, 3+ b/l LE pitting edema Neuro: alert & oriented x 3, cranial nerves grossly intact. moves all 4 extremities w/o difficulty. Affect pleasant.   ASSESSMENT & PLAN:  1. Acute on chronic systolic CHF with prominent RV failure: Ischemic cardiomyopathy.  Echo in 7/19 showed LV EF 45-50% (improved) with mildly dilated/mildly dysfunction RV.  D-shaped septum suggested RV pressure/volume overload.   Echo in 9/20 showed EF 45%, mildly dysfunctional RV, PASP 46 mmHg with dilated IVC.  PYP scan negative and myeloma workup negative, no evidence for cardiac amyloidosis.  Echo in 4/22 showed EF 45% with diffuse hypokinesis, mildly decreased RV systolic function, PASP 47, IVC dilated, moderate TR, mild MR.  Echo 3/24 showed EF 50%, mildly decreased RV systolic function, dilated IVC, PASP 47 mmHg. Most recent echo 3/25 showed drop in EF down to 35-40% w/ diffuse HK, RV mildly reduced. This was in the setting of admission for sepsis 2/2 celluitis. During admission, also noted to have high burden PVCs at 19%.  - Cause for drop in EF uncertain. Possible stressed related CM in the setting of acute infection vs PVC mediated. Doubt ischemic. She denies CP  - NYHA Class III, not very functional at baseline  - Volume overloaded w/ 2-3+ b/l pitting edema, JVD elevated  - needs compression. Have ordered unna boots  - she has Furoscix at home, 2 doses remaining. Instructed to do Furoscix 80 mg + 2.5 of metolazone w/ 20 mEq of KCl daily x the next 2 days (hold torsemide) - restart torsemide starting 4/19 and increase dose to 100 mg qam/80 mg qpm  - Off Spiro due to hyperkalemia  - With urinary incontinence and non-healing sacral wound, now off Jardiance. - Continue Toprol XL 25 mg daily  - 2L fluid restriction  - Recommended  Cardiomems placement, unfortunately not approved with insurance.  - needs f/u BMP in 7 days (at SNF)   2. Frequent PVCs: noted recent hospitalization, reportedly as high as 17%. Transitioned from Coreg to Toprol XL, per hospital treatment team for better suppression  - EKG today shows frequent PVCs (3) - considered adding amiodarone for suppression but will wait until better diuresed. Concerned regarding electrolyte abnormalities w/ diuretic increases and risk for QT prolongation. Will also reassess burden now that she is on Toprol.  - place 2 wk Zio. If still high burden, will stat AAD,  amiodarone vs mexiletine next visit  - suspect untreated OSA contributing, intolerant to CPAP   3. CAD s/p CABG: Denies CP  - Continue atorvastatin 40 mg daily. Lipids good 3/24, LDL 33 mg/dL  - no ASA given Eliquis use - on ? blocker  4. Atrial fibrillation: Chronic, rate controlled  - Continue Toprol XL  - Continue Eliquis 2.5 mg bid.    5. CKD stage 3:  - check BMP today  - off SGLT2i given h/o GU infections   6. H/o CVA: Continue Eliquis.    7. OSA: She cannot tolerate CPAP.  - No change.  8. HTN: controlled on current regimen    - continue GDMT per above    F/u in 4 wks to reassess volume status and assess PVC burden from Zio  Signed, Ruddy Corral, PA-C  02/26/2024  [facility] [address] [facility] [place] [address] [phone] (office) [phone] (fax)

## 2024-02-26 NOTE — Patient Instructions (Addendum)
 Good to see you today!  Your provider has order Furoscix for you. This is an on-body infuser that gives you a dose of Furosemide.   It will be shipped to your home   Furoscix Direct will call you to discuss before shipping so, PLEASE answer unknown calls  For questions regarding the device call Furosc[employer] Direct at [phone]  Ensure you write down the time you start your infusion so that if there is a problem you will know how long the infusion lasted  Use Furoscix only AS DIRECTED by our office  Dosing Directions:   Day 1=4/17 Furoscix  with metolazone 2.5 mg  and 20 meq potassium  Day 2=4/18 Furoscix with metolazone 2.5 mg and  20 meq potassium   STARTING 4/19 START Torsemide 100 mg in the AM and 80 mg in the PM  Labs done today, your results will be available in MyChart, we will contact you for abnormal readings.   Your provider has recommended that  you wear a Zio Patch for 14 days.  This monitor will record your heart rhythm for our review.  IF you have any symptoms while wearing the monitor please press the button.  If you have any issues with the patch or you notice a red or orange light on it please call the company at [phone].  Once you remove the patch please mail it back to the company as soon as possible so we can get the results.  Please wrap legs with Unna boots  Your physician recommends that you schedule a follow-up appointment :4 weeks  If you have any questions or concerns before your next appointment please send us  a message through [place] or call our office at [phone].    TO LEAVE A MESSAGE FOR THE NURSE SELECT OPTION 2, PLEASE LEAVE A MESSAGE INCLUDING: YOUR NAME DATE OF BIRTH CALL BACK NUMBER REASON FOR CALL**this is important as we prioritize the call backs  YOU WILL RECEIVE A CALL BACK THE SAME DAY AS LONG AS YOU CALL BEFORE 4:00 PM  At the [facility], you and your health needs are our priority. As part of our  continuing mission to provide you with exceptional heart care, we have created designated Provider Care Teams. These Care Teams include your primary Cardiologist (physician) and Advanced Practice Providers (APPs- Physician Assistants and Nurse Practitioners) who all work together to provide you with the care you need, when you need it.   You may see any of the following providers on your designated Care Team at your next follow up: Dr Jules Oar Dr Peder Bourdon Dr. Alwin Baars Dr. Arta Lark Amy Marijane Shoulders, NP Ruddy Corral, [place] [facility] [place], [place] Dennise Fitz, NP [place] Lee, NP Shawnee Dellen, NP Luster Salters, PharmD Bevely Brush, PharmD   Please be sure to bring in all your medications bottles to every appointment.    Thank you for choosing [facility]

## 2024-02-26 NOTE — Telephone Encounter (Signed)
-----   Message from [place] sent at 02/26/2024  2:09 PM EDT ----- Labs stable. K 3.7. Recommend she restart daily KCL to 20 mEq daily since we increased her diuretics today. Needs f/u BMP at SNF in 7 days

## 2024-03-02 DIAGNOSIS — I10 Essential (primary) hypertension: Secondary | ICD-10-CM | POA: Diagnosis not present

## 2024-03-05 ENCOUNTER — Telehealth: Payer: Self-pay

## 2024-03-05 NOTE — Telephone Encounter (Signed)
 Pharmacy Patient Advocate Encounter   Received notification from CoverMyMeds that prior authorization for LIDOCAINE  5% PATCHES is required/requested.   Insurance verification completed.   The patient is insured through [facility] .   PA required; PA submitted to above mentioned insurance via CoverMyMeds Key/confirmation #/EOC [id number]. Status is pending

## 2024-03-06 NOTE — Telephone Encounter (Signed)
 Pharmacy Patient Advocate Encounter  Received notification from  [employer]  that Prior Authorization for LIDOCAINE  5% PATCH has been DENIED.  Full denial letter will be uploaded to the media tab. See denial reason below.    LIDOCAINE  4% PATCHES AVAILABLE OTC

## 2024-03-12 DIAGNOSIS — Z79899 Other long term (current) drug therapy: Secondary | ICD-10-CM | POA: Diagnosis not present

## 2024-03-12 DIAGNOSIS — E669 Obesity, unspecified: Secondary | ICD-10-CM | POA: Diagnosis not present

## 2024-03-12 DIAGNOSIS — I4811 Longstanding persistent atrial fibrillation: Secondary | ICD-10-CM | POA: Diagnosis not present

## 2024-03-12 DIAGNOSIS — H353114 Nonexudative age-related macular degeneration, right eye, advanced atrophic with subfoveal involvement: Secondary | ICD-10-CM | POA: Diagnosis not present

## 2024-03-12 DIAGNOSIS — Z951 Presence of aortocoronary bypass graft: Secondary | ICD-10-CM | POA: Diagnosis not present

## 2024-03-12 DIAGNOSIS — I5043 Acute on chronic combined systolic (congestive) and diastolic (congestive) heart failure: Secondary | ICD-10-CM | POA: Diagnosis not present

## 2024-03-12 DIAGNOSIS — I2510 Atherosclerotic heart disease of native coronary artery without angina pectoris: Secondary | ICD-10-CM | POA: Diagnosis not present

## 2024-03-12 DIAGNOSIS — I272 Pulmonary hypertension, unspecified: Secondary | ICD-10-CM | POA: Diagnosis not present

## 2024-03-12 DIAGNOSIS — G4733 Obstructive sleep apnea (adult) (pediatric): Secondary | ICD-10-CM | POA: Diagnosis not present

## 2024-03-12 DIAGNOSIS — Z6837 Body mass index (BMI) 37.0-37.9, adult: Secondary | ICD-10-CM | POA: Diagnosis not present

## 2024-03-12 DIAGNOSIS — I872 Venous insufficiency (chronic) (peripheral): Secondary | ICD-10-CM | POA: Diagnosis not present

## 2024-03-12 DIAGNOSIS — E875 Hyperkalemia: Secondary | ICD-10-CM | POA: Diagnosis not present

## 2024-03-12 DIAGNOSIS — Z791 Long term (current) use of non-steroidal anti-inflammatories (NSAID): Secondary | ICD-10-CM | POA: Diagnosis not present

## 2024-03-12 DIAGNOSIS — I5020 Unspecified systolic (congestive) heart failure: Secondary | ICD-10-CM | POA: Diagnosis not present

## 2024-03-12 DIAGNOSIS — N183 Chronic kidney disease, stage 3 unspecified: Secondary | ICD-10-CM | POA: Diagnosis not present

## 2024-03-12 DIAGNOSIS — T148XXA Other injury of unspecified body region, initial encounter: Secondary | ICD-10-CM | POA: Diagnosis not present

## 2024-03-12 DIAGNOSIS — L089 Local infection of the skin and subcutaneous tissue, unspecified: Secondary | ICD-10-CM | POA: Diagnosis not present

## 2024-03-12 DIAGNOSIS — I4819 Other persistent atrial fibrillation: Secondary | ICD-10-CM | POA: Diagnosis not present

## 2024-03-12 DIAGNOSIS — E1169 Type 2 diabetes mellitus with other specified complication: Secondary | ICD-10-CM | POA: Diagnosis not present

## 2024-03-12 DIAGNOSIS — I5022 Chronic systolic (congestive) heart failure: Secondary | ICD-10-CM | POA: Diagnosis present

## 2024-03-12 DIAGNOSIS — Z8673 Personal history of transient ischemic attack (TIA), and cerebral infarction without residual deficits: Secondary | ICD-10-CM | POA: Diagnosis not present

## 2024-03-12 DIAGNOSIS — R32 Unspecified urinary incontinence: Secondary | ICD-10-CM | POA: Diagnosis not present

## 2024-03-12 DIAGNOSIS — G2581 Restless legs syndrome: Secondary | ICD-10-CM | POA: Diagnosis not present

## 2024-03-12 DIAGNOSIS — R634 Abnormal weight loss: Secondary | ICD-10-CM | POA: Diagnosis not present

## 2024-03-12 DIAGNOSIS — L89154 Pressure ulcer of sacral region, stage 4: Secondary | ICD-10-CM | POA: Diagnosis not present

## 2024-03-12 DIAGNOSIS — I10 Essential (primary) hypertension: Secondary | ICD-10-CM | POA: Diagnosis not present

## 2024-03-12 DIAGNOSIS — E1122 Type 2 diabetes mellitus with diabetic chronic kidney disease: Secondary | ICD-10-CM | POA: Diagnosis not present

## 2024-03-12 DIAGNOSIS — Z7985 Long-term (current) use of injectable non-insulin antidiabetic drugs: Secondary | ICD-10-CM | POA: Diagnosis not present

## 2024-03-12 DIAGNOSIS — E119 Type 2 diabetes mellitus without complications: Secondary | ICD-10-CM | POA: Diagnosis not present

## 2024-03-12 DIAGNOSIS — D508 Other iron deficiency anemias: Secondary | ICD-10-CM | POA: Diagnosis not present

## 2024-03-12 DIAGNOSIS — L89153 Pressure ulcer of sacral region, stage 3: Secondary | ICD-10-CM | POA: Diagnosis not present

## 2024-03-12 DIAGNOSIS — I130 Hypertensive heart and chronic kidney disease with heart failure and stage 1 through stage 4 chronic kidney disease, or unspecified chronic kidney disease: Secondary | ICD-10-CM | POA: Diagnosis not present

## 2024-03-12 DIAGNOSIS — R0602 Shortness of breath: Secondary | ICD-10-CM | POA: Diagnosis not present

## 2024-03-12 DIAGNOSIS — E1165 Type 2 diabetes mellitus with hyperglycemia: Secondary | ICD-10-CM | POA: Diagnosis not present

## 2024-03-12 DIAGNOSIS — R419 Unspecified symptoms and signs involving cognitive functions and awareness: Secondary | ICD-10-CM | POA: Diagnosis not present

## 2024-03-12 DIAGNOSIS — I493 Ventricular premature depolarization: Secondary | ICD-10-CM | POA: Diagnosis not present

## 2024-03-12 DIAGNOSIS — I50810 Right heart failure, unspecified: Secondary | ICD-10-CM | POA: Diagnosis not present

## 2024-03-12 DIAGNOSIS — R5381 Other malaise: Secondary | ICD-10-CM | POA: Diagnosis not present

## 2024-03-12 DIAGNOSIS — Z7901 Long term (current) use of anticoagulants: Secondary | ICD-10-CM | POA: Diagnosis not present

## 2024-03-12 DIAGNOSIS — N184 Chronic kidney disease, stage 4 (severe): Secondary | ICD-10-CM | POA: Diagnosis not present

## 2024-03-12 DIAGNOSIS — I255 Ischemic cardiomyopathy: Secondary | ICD-10-CM | POA: Diagnosis not present

## 2024-03-12 DIAGNOSIS — L03115 Cellulitis of right lower limb: Secondary | ICD-10-CM | POA: Diagnosis not present

## 2024-03-12 DIAGNOSIS — G3184 Mild cognitive impairment, so stated: Secondary | ICD-10-CM | POA: Diagnosis not present

## 2024-03-13 ENCOUNTER — Telehealth: Payer: Self-pay

## 2024-03-13 NOTE — Telephone Encounter (Signed)
 Spoke with patients son to see if they wanted to make appt for [facility] clinic. Patients son informed me that patient is now in a rehab center. Linnie Riches, CMA

## 2024-03-16 DIAGNOSIS — I493 Ventricular premature depolarization: Secondary | ICD-10-CM | POA: Diagnosis not present

## 2024-03-20 DIAGNOSIS — Z7985 Long-term (current) use of injectable non-insulin antidiabetic drugs: Secondary | ICD-10-CM | POA: Diagnosis not present

## 2024-03-20 DIAGNOSIS — E1165 Type 2 diabetes mellitus with hyperglycemia: Secondary | ICD-10-CM | POA: Diagnosis not present

## 2024-03-20 DIAGNOSIS — R634 Abnormal weight loss: Secondary | ICD-10-CM | POA: Diagnosis not present

## 2024-03-20 DIAGNOSIS — L89153 Pressure ulcer of sacral region, stage 3: Secondary | ICD-10-CM | POA: Diagnosis not present

## 2024-03-20 DIAGNOSIS — R419 Unspecified symptoms and signs involving cognitive functions and awareness: Secondary | ICD-10-CM | POA: Diagnosis not present

## 2024-03-23 NOTE — Progress Notes (Signed)
 ID:  [name], DOB [birth year], MRN [id number]   Provider location: [place] [facility] Type of Visit: Established patient   PCP:  [name] Grace, MD  Primary Cardiologist:  [name] Keas, MD HF Cardiologist: Dr. Mitzie [name]  Reason for Visit: [name] F/u, Biventricular Systolic Heart Failure    History of Present Illness: [name] is a 83 y.o. female who has a history of CAD status post CABG 2009, ischemic cardiomyopathy with chronic systolic CHF, persistent atrial fibrillation with stroke in 03/2016 when INR was subtherapeutic, stage III CKD, morbid obesity, pulmonary hypertension, diabetes, GI bleed 03/2017, and severe sleep apnea.    Seen in [facility] clinics 09/10/18 and 09/24/18, both times with volume overload and marked peripheral edema with skin breakdown. Torsemide  increased at each visit. Echo in 7/19 showed EF 45-50% with mild RV dilation/mild RV systolic dysfunction and D-shaped interventricular septum.    AKI earlier in 7/20 in setting of excessive torsemide , she had increased dose to 60 qam/40 qpm on her own.  This was cut back to 40 mg daily and losartan  and spironolactone  were stopped.   Echo in 9/20 showed EF 45% with mildly decreased RV systolic function, PASP 46 mmHg with dilated IVC.  PYP scan in 10/21 was negative.   Echo in 4/22 showed EF 45% with diffuse hypokinesis, mildly decreased RV systolic function, PASP 47, IVC dilated, moderate TR, mild MR.   Echo 3/24 showed EF 50%, mildly decreased RV systolic function, dilated IVC, PASP 47 mmHg.   At AHF follow up 2/25 she was volume overloaded and was given 3 days of furoscix  and increased diuretic regimen. Had a new sacral wound so SGLT2i was stopped.   Seen in [name] on 01/23/24. Was volume overloaded w/ R>L sided HF symptoms. She was continued on torsemide  100 mg bid and instructed to take metolazone  daily x 2 days, then return for f/u in 1 wk.   Unfortunately, pt ended up getting admitted for  sepsis 2/2 LE cellulitis, in the setting of chronic venous stasis. Also w/ AKI and hyperkalemia. Spironolactone  was discontinued. Treated w/ Lokelma . Started on abx for cellulitis.  Cardiology was asked to assist w/ volume management. She was diuresed w/ IV Lasix . Echo was repeated, showing drop in EF down to 35-40%, RV mildly reduced. IVC was dilated, assumed RAP ~15 mmHg. After diuresis w/ IV Lasix , she was transitioned back to torsemide  but at lower dose of 80 mg bid. Kept off of spiro. She was also transitioned from Coreg  to metoprolol  for suppression of PVCs, noted to have high burden percentage at 17%.   She presents today for post hospital f/u. Here w/ son. In motorized WC. Lives at SNF. C/w marked fluid overload, 2-3+ b/l LEE. Cellulitis resolved. Denies resting dyspnea. Not very active. Able to participate in some PT, mainly transfers to and from chair/bed and leg exercises. Has issues w/ incontinence. Wears depends.   She denies CP. EKG shows rate controlled afib and frequent PVCs, though asymptomatic. HR 75 bpm.    ECG (personally reviewed): afib 75 bpm, frequent PVCs (3)  Review of systems complete and found to be negative unless listed in HPI.    Past Medical History 1. Chronic systolic CHF: Ischemic cardiomyopathy with prominent RV failure.   - Echo (11/18): LVEF 35-40%, severe RV dilation, severe RAE, severe TR.  - Echo (7/19): EF 45-50%, diffuse hypokinesis, mild RV dilation with mildly decreased RV systolic function, D-shaped interventricular septum suggestive of RV pressure/volume overload, mild  MR, moderate TR, PASP 49 mmhg.  - Echo (9/20): EF 45%, mild LV dilation, mild RV dilation with mildly decreased systolic function, PASP 46 mmHg, IVC dilated.  - Labs (9/21): myeloma panel negative, urine immunofixation negative - PYP scan (10/21): Negative.  - Echo (4/22): EF 45% with diffuse hypokinesis, mildly decreased RV systolic function, PASP 47, IVC dilated, moderate TR, mild MR. -  Echo (3/24): EF 50%, mildly decreased RV systolic function, dilated IVC, PASP 47 mmHg.  2. CAD: CABG 2009. No angiography since that time.   3. HTN 4. Atrial fibrillation: Chronic since 2013.  5. CKD stage 3 6. Morbid obesity 7. OSA 8. Chronic venous stasis with RLE wound/Bullae 9. CVA 5/18.   Current Outpatient Medications  Medication Sig Dispense Refill   acetaminophen  (TYLENOL  8 HOUR) 650 MG CR tablet Take 1 tablet (650 mg total) by mouth every 8 (eight) hours as needed for pain. 30 tablet 0   apixaban  (ELIQUIS ) 2.5 MG TABS tablet Take 1 tablet (2.5 mg total) by mouth 2 (two) times daily. 180 tablet 3   atorvastatin  (LIPITOR) 40 MG tablet Take 1 tablet (40 mg total) by mouth daily. 100 tablet 2   Blood Glucose Monitoring Suppl (ONE TOUCH ULTRA 2) w/Device KIT USE TO CHECK BLOOD SUGAR UPTO 3 TIMES A DAY 1 kit 0   brimonidine  (ALPHAGAN ) 0.2 % ophthalmic solution Place 1 drop into both eyes 2 (two) times daily.     Cholecalciferol  25 MCG (1000 UT) tablet Take 1,000 Units by mouth daily.     clotrimazole  (LOTRIMIN  AF) 1 % cream Apply 1 Application topically 2 (two) times daily. 113 g 1   diclofenac  Sodium (VOLTAREN ) 1 % GEL Apply 2 g topically as needed. 100 g 1   docusate sodium  (COLACE) 100 MG capsule Take 200 mg by mouth at bedtime.     Dulaglutide  (TRULICITY ) 1.5 MG/0.5ML SOAJ Inject 1.5 mg into the skin once a week. 2 mL 0   ferrous sulfate  325 (65 FE) MG tablet TAKE 1 TABLET BY MOUTH EVERY DAY WITH BREAKFAST 90 tablet 1   glucose blood (ONE TOUCH ULTRA TEST) test strip Use three times a day 100 each 3   glucose blood test strip Use as instructed to check blood glucose up to 4x daily 100 each 12   Lancets (ONETOUCH DELICA PLUS LANCET33G) MISC Please use to check blood sugar up to four times daily. 100 each 6   lidocaine  (LIDODERM ) 5 % Place 1 patch onto the skin daily. Remove & Discard patch within 12 hours or as directed by MD (Patient taking differently: Place 1 patch onto the skin  daily as needed. Remove & Discard patch within 12 hours or as directed by MD) 30 patch 0   megestrol  (MEGACE ) 40 MG tablet Take 1 tablet (40 mg total) by mouth daily. 30 tablet 0   metolazone  (ZAROXOLYN ) 2.5 MG tablet Take 1 tablet (2.5 mg total) by mouth daily. 5 tablet 1   metoprolol  succinate (TOPROL -XL) 25 MG 24 hr tablet Take 1 tablet (25 mg total) by mouth daily. (Patient taking differently: Take 12.5 mg by mouth daily.)     Multiple Vitamins-Minerals (PRESERVISION AREDS 2+MULTI VIT PO) Take 1 capsule by mouth in the morning and at bedtime.     nitroGLYCERIN  (NITROSTAT ) 0.4 MG SL tablet Place 1 tablet (0.4 mg total) under the tongue every 5 (five) minutes as needed for chest pain (up to 3 doses). 15 tablet 5   nystatin  (MYCOSTATIN /NYSTOP ) powder Apply 1 Application  topically 3 (three) times daily. Until resolution. 15 g 0   polyethylene glycol (MIRALAX  / GLYCOLAX ) 17 g packet Take 17 g by mouth 2 (two) times daily.     potassium chloride  SA (KLOR-CON  M) 20 MEQ tablet Take 1 tablet (20 mEq total) by mouth daily.     rOPINIRole  (REQUIP ) 0.5 MG tablet Take 2 tablets (1 mg total) by mouth at bedtime. 180 tablet 3   senna (SENOKOT) 8.6 MG TABS tablet Take 1 tablet (8.6 mg total) by mouth 2 (two) times daily.     Torsemide  40 MG TABS Take 100 mg by mouth in the morning AND 80 mg every evening.     traMADol  (ULTRAM ) 50 MG tablet Take 1 tablet (50 mg total) by mouth every 12 (twelve) hours as needed for severe pain (pain score 7-10) (Can take every 6 hours for breakthrough pain.). 60 tablet 1   vitamin B-12 (CYANOCOBALAMIN ) 1000 MCG tablet Take 1 tablet (1,000 mcg total) by mouth daily. 90 tablet 1   zinc  oxide 20 % ointment Apply 1 application topically as needed (buttocks irritation). 425 g 1   No current facility-administered medications for this visit.   Allergies  Allergen Reactions   Benazepril Other (See Comments)    Unknown reaction at age 3-65 - possibly dizziness   Cymbalta  [Duloxetine   Hcl]     Itch, Dry mouth    Ozempic  (0.25 Or 0.5 Mg-Dose) [Semaglutide (0.25 Or 0.5mg -Dos)] Diarrhea    Gi intollerance   Tape     TOLERATES PAPER TAPE ONLY- due to thin skin. NOT ALLERGY PER PT   Angiotensin Receptor Blockers Other (See Comments)    Hypotension reaction   Bactrim [Sulfamethoxazole-Trimethoprim] Other (See Comments)    Unknown reaction   Fe-Succ-C-Thre-B12-Des Stomach Other (See Comments)    Unknown reaction  Ferocon?   Social History   Socioeconomic History   Marital status: Widowed    Spouse name: Not on file   Number of children: 2   Years of education: 75   Highest education level: Not on file  Occupational History    Comment: retired  Tobacco Use   Smoking status: Never    Passive exposure: Never   Smokeless tobacco: Never  Vaping Use   Vaping status: Never Used  Substance and Sexual Activity   Alcohol use: No   Drug use: Never   Sexual activity: Not Currently  Other Topics Concern   Not on file  Social History Narrative   Widowed   01/27/21 Lives with son,  in [place]   2 children   Not routinely exercising   Social Drivers of Health   Financial Resource Strain: Low Risk  (05/06/2023)   Overall Financial Resource Strain (CARDIA)    Difficulty of Paying Living Expenses: Not hard at all  Food Insecurity: No Food Insecurity (01/30/2024)   Hunger Vital Sign    Worried About Running Out of Food in the Last Year: Never true    Ran Out of Food in the Last Year: Never true  Transportation Needs: No Transportation Needs (01/30/2024)   PRAPARE - Administrator, Civil Service (Medical): No    Lack of Transportation (Non-Medical): No  Physical Activity: Inactive (09/24/2023)   Exercise Vital Sign    Days of Exercise per Week: 0 days    Minutes of Exercise per Session: 0 min  Stress: No Stress Concern Present (05/06/2023)   [employer] of Occupational Health - Occupational Stress Questionnaire    Feeling of Stress :  Not at all   Social Connections: Socially Isolated (01/30/2024)   Social Connection and Isolation Panel [NHANES]    Frequency of Communication with Friends and Family: More than three times a week    Frequency of Social Gatherings with Friends and Family: Once a week    Attends Religious Services: Never    Database administrator or Organizations: No    Attends Banker Meetings: Never    Marital Status: Widowed  Intimate Partner Violence: Not At Risk (01/30/2024)   Humiliation, Afraid, Rape, and Kick questionnaire    Fear of Current or Ex-Partner: No    Emotionally Abused: No    Physically Abused: No    Sexually Abused: No   Family History  Problem Relation Age of Onset   Clotting disorder Mother        Cerebral hemorrhage   Other Mother        cerebral hemorrhage   Emphysema Father        COD   Lung cancer Brother    Heart disease Neg Hx    LMP  (LMP Unknown)   Wt Readings from Last 3 Encounters:  02/07/24 110.4 kg (243 lb 6.2 oz)  01/29/24 98.8 kg (217 lb 12.8 oz)  01/23/24 100.5 kg (221 lb 9.6 oz)    PHYSICAL EXAM: General:  elderly, obese, chronically ill appearing. In WC. No respiratory difficulty HEENT: normal Neck: supple. JVD 10 cm. Carotids 2+ bilat; no bruits. No lymphadenopathy or thyromegaly appreciated. Cor: PMI nondisplaced. Irregularly irregular rhythm and rate. No rubs, gallops or murmurs. Lungs: decreased BS at the bases Abdomen: soft, nontender, nondistended. No hepatosplenomegaly. No bruits or masses. Good bowel sounds. Extremities: no cyanosis, clubbing, rash, 3+ b/l LE pitting edema Neuro: alert & oriented x 3, cranial nerves grossly intact. moves all 4 extremities w/o difficulty. Affect pleasant.   ASSESSMENT & PLAN:  1. Acute on chronic systolic CHF with prominent RV failure: Ischemic cardiomyopathy.  Echo in 7/19 showed LV EF 45-50% (improved) with mildly dilated/mildly dysfunction RV.  D-shaped septum suggested RV pressure/volume overload.  Echo  in 9/20 showed EF 45%, mildly dysfunctional RV, PASP 46 mmHg with dilated IVC.  PYP scan negative and myeloma workup negative, no evidence for cardiac amyloidosis.  Echo in 4/22 showed EF 45% with diffuse hypokinesis, mildly decreased RV systolic function, PASP 47, IVC dilated, moderate TR, mild MR.  Echo 3/24 showed EF 50%, mildly decreased RV systolic function, dilated IVC, PASP 47 mmHg. Most recent echo 3/25 showed drop in EF down to 35-40% w/ diffuse HK, RV mildly reduced. This was in the setting of admission for sepsis 2/2 celluitis. During admission, also noted to have high burden PVCs at 19%.  - Cause for drop in EF uncertain. Possible stressed related CM in the setting of acute infection vs PVC mediated. Doubt ischemic. She denies CP  - NYHA Class III, not very functional at baseline  - Volume overloaded w/ 2-3+ b/l pitting edema, JVD elevated  - needs compression. Have ordered unna boots  - she has Furoscix  at home, 2 doses remaining. Instructed to do Furoscix  80 mg + 2.5 of metolazone  w/ 20 mEq of KCl daily x the next 2 days (hold torsemide ) - restart torsemide  starting 4/19 and increase dose to 100 mg qam/80 mg qpm  - Off Spiro due to hyperkalemia  - With urinary incontinence and non-healing sacral wound, now off Jardiance . - Continue Toprol  XL 25 mg daily  - 2L fluid restriction  - Recommended  Cardiomems placement, unfortunately not approved with insurance.  - needs f/u BMP in 7 days (at SNF)   2. Frequent PVCs: noted recent hospitalization, reportedly as high as 17%. Transitioned from Coreg  to Toprol  XL, per hospital treatment team for better suppression  - EKG today shows frequent PVCs (3) - considered adding amiodarone for suppression but will wait until better diuresed. Concerned regarding electrolyte abnormalities w/ diuretic increases and risk for QT prolongation. Will also reassess burden now that she is on Toprol .  - place 2 wk Zio. If still high burden, will stat AAD, amiodarone  vs mexiletine next visit  - suspect untreated OSA contributing, intolerant to CPAP   3. CAD s/p CABG: Denies CP  - Continue atorvastatin  40 mg daily. Lipids good 3/24, LDL 33 mg/dL  - no ASA given Eliquis  use - on ? blocker  4. Atrial fibrillation: Chronic, rate controlled  - Continue Toprol  XL  - Continue Eliquis  2.5 mg bid.    5. CKD stage 3:  - check BMP today  - off SGLT2i given h/o GU infections   6. H/o CVA: Continue Eliquis .    7. OSA: She cannot tolerate CPAP.  - No change.  8. HTN: controlled on current regimen    - continue GDMT per above    F/u in 4 wks to reassess volume status and assess PVC burden from Zio  Signed, Elmarie Hacking, FNP  03/23/2024  [facility] [address] [facility] [name] [address] [phone] (office) [phone] (fax)

## 2024-03-24 DIAGNOSIS — T148XXA Other injury of unspecified body region, initial encounter: Secondary | ICD-10-CM | POA: Diagnosis not present

## 2024-03-24 DIAGNOSIS — L89154 Pressure ulcer of sacral region, stage 4: Secondary | ICD-10-CM | POA: Diagnosis not present

## 2024-03-24 DIAGNOSIS — L089 Local infection of the skin and subcutaneous tissue, unspecified: Secondary | ICD-10-CM | POA: Diagnosis not present

## 2024-03-24 DIAGNOSIS — N184 Chronic kidney disease, stage 4 (severe): Secondary | ICD-10-CM | POA: Diagnosis not present

## 2024-03-24 DIAGNOSIS — E1122 Type 2 diabetes mellitus with diabetic chronic kidney disease: Secondary | ICD-10-CM | POA: Diagnosis not present

## 2024-03-24 NOTE — Addendum Note (Signed)
 Encounter addended by: Stan Eans, RN on: 03/24/2024 12:15 PM  Actions taken: Imaging Exam ended

## 2024-03-25 ENCOUNTER — Ambulatory Visit (HOSPITAL_COMMUNITY)
Admission: RE | Admit: 2024-03-25 | Discharge: 2024-03-25 | Disposition: A | Discharge status: Home / Self Care | Source: Ambulatory Visit | Attending: Family Medicine | Admitting: Family Medicine

## 2024-03-25 ENCOUNTER — Encounter (HOSPITAL_COMMUNITY): Payer: Self-pay

## 2024-03-25 ENCOUNTER — Ambulatory Visit (HOSPITAL_COMMUNITY): Payer: Self-pay | Admitting: Family Medicine

## 2024-03-25 VITALS — BP 114/64 | HR 67 | Ht 68.0 in

## 2024-03-25 DIAGNOSIS — N183 Chronic kidney disease, stage 3 unspecified: Secondary | ICD-10-CM | POA: Diagnosis not present

## 2024-03-25 DIAGNOSIS — R32 Unspecified urinary incontinence: Secondary | ICD-10-CM | POA: Diagnosis not present

## 2024-03-25 DIAGNOSIS — I130 Hypertensive heart and chronic kidney disease with heart failure and stage 1 through stage 4 chronic kidney disease, or unspecified chronic kidney disease: Secondary | ICD-10-CM | POA: Diagnosis not present

## 2024-03-25 DIAGNOSIS — Z79899 Other long term (current) drug therapy: Secondary | ICD-10-CM | POA: Insufficient documentation

## 2024-03-25 DIAGNOSIS — D508 Other iron deficiency anemias: Secondary | ICD-10-CM | POA: Diagnosis not present

## 2024-03-25 DIAGNOSIS — G4733 Obstructive sleep apnea (adult) (pediatric): Secondary | ICD-10-CM | POA: Insufficient documentation

## 2024-03-25 DIAGNOSIS — I493 Ventricular premature depolarization: Secondary | ICD-10-CM | POA: Diagnosis not present

## 2024-03-25 DIAGNOSIS — Z7901 Long term (current) use of anticoagulants: Secondary | ICD-10-CM | POA: Diagnosis not present

## 2024-03-25 DIAGNOSIS — Z8673 Personal history of transient ischemic attack (TIA), and cerebral infarction without residual deficits: Secondary | ICD-10-CM | POA: Diagnosis not present

## 2024-03-25 DIAGNOSIS — Z7985 Long-term (current) use of injectable non-insulin antidiabetic drugs: Secondary | ICD-10-CM | POA: Diagnosis not present

## 2024-03-25 DIAGNOSIS — Z951 Presence of aortocoronary bypass graft: Secondary | ICD-10-CM | POA: Insufficient documentation

## 2024-03-25 DIAGNOSIS — I2510 Atherosclerotic heart disease of native coronary artery without angina pectoris: Secondary | ICD-10-CM | POA: Insufficient documentation

## 2024-03-25 DIAGNOSIS — I10 Essential (primary) hypertension: Secondary | ICD-10-CM

## 2024-03-25 DIAGNOSIS — Z6837 Body mass index (BMI) 37.0-37.9, adult: Secondary | ICD-10-CM | POA: Diagnosis not present

## 2024-03-25 DIAGNOSIS — I272 Pulmonary hypertension, unspecified: Secondary | ICD-10-CM | POA: Diagnosis not present

## 2024-03-25 DIAGNOSIS — I50810 Right heart failure, unspecified: Secondary | ICD-10-CM | POA: Insufficient documentation

## 2024-03-25 DIAGNOSIS — I255 Ischemic cardiomyopathy: Secondary | ICD-10-CM | POA: Insufficient documentation

## 2024-03-25 DIAGNOSIS — I4819 Other persistent atrial fibrillation: Secondary | ICD-10-CM | POA: Diagnosis not present

## 2024-03-25 DIAGNOSIS — E1122 Type 2 diabetes mellitus with diabetic chronic kidney disease: Secondary | ICD-10-CM | POA: Diagnosis not present

## 2024-03-25 DIAGNOSIS — E875 Hyperkalemia: Secondary | ICD-10-CM | POA: Diagnosis not present

## 2024-03-25 DIAGNOSIS — R0602 Shortness of breath: Secondary | ICD-10-CM | POA: Insufficient documentation

## 2024-03-25 DIAGNOSIS — I5022 Chronic systolic (congestive) heart failure: Secondary | ICD-10-CM | POA: Diagnosis not present

## 2024-03-25 LAB — COMPREHENSIVE METABOLIC PANEL WITH GFR
ALT: 7 U/L (ref 0–44)
AST: 13 U/L — ABNORMAL LOW (ref 15–41)
Albumin: 2.6 g/dL — ABNORMAL LOW (ref 3.5–5.0)
Alkaline Phosphatase: 99 U/L (ref 38–126)
Anion gap: 10 (ref 5–15)
BUN: 58 mg/dL — ABNORMAL HIGH (ref 8–23)
CO2: 23 mmol/L (ref 22–32)
Calcium: 9.8 mg/dL (ref 8.9–10.3)
Chloride: 100 mmol/L (ref 98–111)
Creatinine, Ser: 2.05 mg/dL — ABNORMAL HIGH (ref 0.44–1.00)
GFR, Estimated: 23 mL/min — ABNORMAL LOW (ref >60)
Glucose, Bld: 186 mg/dL — ABNORMAL HIGH (ref 70–99)
Potassium: 4.4 mmol/L (ref 3.5–5.1)
Sodium: 133 mmol/L — ABNORMAL LOW (ref 135–145)
Total Bilirubin: 0.9 mg/dL (ref 0.0–1.2)
Total Protein: 8 g/dL (ref 6.5–8.1)

## 2024-03-25 LAB — BRAIN NATRIURETIC PEPTIDE: B Natriuretic Peptide: 163.7 pg/mL — ABNORMAL HIGH (ref 0.0–100.0)

## 2024-03-25 LAB — TSH: TSH: 2.249 u[IU]/mL (ref 0.350–4.500)

## 2024-03-25 MED ORDER — AMIODARONE HCL 200 MG PO TABS: 200.0000 mg | ORAL_TABLET | Freq: Every day | ORAL | Status: DC

## 2024-03-25 NOTE — Patient Instructions (Addendum)
 Thank you for coming in today  If you had labs drawn today, any labs that are abnormal the clinic will call you No news is good news  Medications: START Amiodarone 200 mg 1 tablet daily   Follow up appointments:  Your physician recommends that you schedule a follow-up appointment in:  3 months with With Dr. Mitzie Anda with echocardiogram Please call our office to schedule the follow-up appointment in June for August 2025 .  Your physician has requested that you have an echocardiogram. Echocardiography is a painless test that uses sound waves to create images of your heart. It provides your doctor with information about the size and shape of your heart and how well your heart's chambers and valves are working. This procedure takes approximately one hour. There are no restrictions for this procedure.     Do the following things EVERYDAY: Weigh yourself in the morning before breakfast. Write it down and keep it in a log. Take your medicines as prescribed Eat low salt foods--Limit salt (sodium) to 2000 mg per day.  Stay as active as you can everyday Limit all fluids for the day to less than 2 liters   At the [facility], you and your health needs are our priority. As part of our continuing mission to provide you with exceptional heart care, we have created designated Provider Care Teams. These Care Teams include your primary Cardiologist (physician) and Advanced Practice Providers (APPs- Physician Assistants and Nurse Practitioners) who all work together to provide you with the care you need, when you need it.   You may see any of the following providers on your designated Care Team at your next follow up: Dr Jules Oar Dr Peder Bourdon Dr. Mimi Alt, NP Ruddy Corral, [place] [facility] [place], [place] Dennise Fitz, NP Luster Salters, PharmD   Please be sure to bring in all your medications bottles to every appointment.    Thank you for  choosing [facility]  If you have any questions or concerns before your next appointment please send us  a message through [place] or call our office at [phone].    TO LEAVE A MESSAGE FOR THE NURSE SELECT OPTION 2, PLEASE LEAVE A MESSAGE INCLUDING: YOUR NAME DATE OF BIRTH CALL BACK NUMBER REASON FOR CALL**this is important as we prioritize the call backs  YOU WILL RECEIVE A CALL BACK THE SAME DAY AS LONG AS YOU CALL BEFORE 4:00 PM

## 2024-03-26 NOTE — Telephone Encounter (Signed)
 Attempted to call [facility] - no answer. Faxed above order along with lab results to [facility] asking they fax repeated lab results to us  at [phone]. If any questions, they can call us  at [phone].  [facility] SN[facility]: Phone: [phone] Fax: [phone]

## 2024-03-29 ENCOUNTER — Ambulatory Visit (HOSPITAL_COMMUNITY): Payer: Self-pay | Admitting: Cardiology

## 2024-04-03 DIAGNOSIS — Z7901 Long term (current) use of anticoagulants: Secondary | ICD-10-CM | POA: Diagnosis not present

## 2024-04-03 DIAGNOSIS — I5020 Unspecified systolic (congestive) heart failure: Secondary | ICD-10-CM | POA: Diagnosis not present

## 2024-04-03 DIAGNOSIS — E669 Obesity, unspecified: Secondary | ICD-10-CM | POA: Diagnosis not present

## 2024-04-03 DIAGNOSIS — G3184 Mild cognitive impairment, so stated: Secondary | ICD-10-CM | POA: Diagnosis not present

## 2024-04-03 DIAGNOSIS — L89154 Pressure ulcer of sacral region, stage 4: Secondary | ICD-10-CM | POA: Diagnosis not present

## 2024-04-03 DIAGNOSIS — R634 Abnormal weight loss: Secondary | ICD-10-CM | POA: Diagnosis not present

## 2024-04-03 DIAGNOSIS — I4811 Longstanding persistent atrial fibrillation: Secondary | ICD-10-CM | POA: Diagnosis not present

## 2024-04-03 DIAGNOSIS — I872 Venous insufficiency (chronic) (peripheral): Secondary | ICD-10-CM | POA: Diagnosis not present

## 2024-04-03 DIAGNOSIS — E1169 Type 2 diabetes mellitus with other specified complication: Secondary | ICD-10-CM | POA: Diagnosis not present

## 2024-04-03 DIAGNOSIS — E1122 Type 2 diabetes mellitus with diabetic chronic kidney disease: Secondary | ICD-10-CM | POA: Diagnosis not present

## 2024-04-03 DIAGNOSIS — R5381 Other malaise: Secondary | ICD-10-CM | POA: Diagnosis not present

## 2024-04-03 DIAGNOSIS — N184 Chronic kidney disease, stage 4 (severe): Secondary | ICD-10-CM | POA: Diagnosis not present

## 2024-04-07 DIAGNOSIS — L89154 Pressure ulcer of sacral region, stage 4: Secondary | ICD-10-CM | POA: Diagnosis not present

## 2024-04-09 DIAGNOSIS — L89154 Pressure ulcer of sacral region, stage 4: Secondary | ICD-10-CM | POA: Diagnosis not present

## 2024-04-12 DIAGNOSIS — Z6831 Body mass index (BMI) 31.0-31.9, adult: Secondary | ICD-10-CM | POA: Diagnosis not present

## 2024-04-12 DIAGNOSIS — L03115 Cellulitis of right lower limb: Secondary | ICD-10-CM | POA: Diagnosis not present

## 2024-04-12 DIAGNOSIS — I4811 Longstanding persistent atrial fibrillation: Secondary | ICD-10-CM | POA: Diagnosis not present

## 2024-04-12 DIAGNOSIS — E8809 Other disorders of plasma-protein metabolism, not elsewhere classified: Secondary | ICD-10-CM | POA: Diagnosis not present

## 2024-04-12 DIAGNOSIS — Z794 Long term (current) use of insulin: Secondary | ICD-10-CM | POA: Diagnosis not present

## 2024-04-12 DIAGNOSIS — I959 Hypotension, unspecified: Secondary | ICD-10-CM | POA: Diagnosis not present

## 2024-04-12 DIAGNOSIS — L89154 Pressure ulcer of sacral region, stage 4: Secondary | ICD-10-CM | POA: Diagnosis not present

## 2024-04-12 DIAGNOSIS — N179 Acute kidney failure, unspecified: Secondary | ICD-10-CM | POA: Diagnosis not present

## 2024-04-12 DIAGNOSIS — I10 Essential (primary) hypertension: Secondary | ICD-10-CM | POA: Diagnosis not present

## 2024-04-12 DIAGNOSIS — D649 Anemia, unspecified: Secondary | ICD-10-CM | POA: Diagnosis not present

## 2024-04-12 DIAGNOSIS — E1122 Type 2 diabetes mellitus with diabetic chronic kidney disease: Secondary | ICD-10-CM | POA: Diagnosis not present

## 2024-04-12 DIAGNOSIS — S0990XA Unspecified injury of head, initial encounter: Secondary | ICD-10-CM | POA: Diagnosis not present

## 2024-04-12 DIAGNOSIS — W19XXXA Unspecified fall, initial encounter: Secondary | ICD-10-CM | POA: Diagnosis not present

## 2024-04-12 DIAGNOSIS — I6381 Other cerebral infarction due to occlusion or stenosis of small artery: Secondary | ICD-10-CM | POA: Diagnosis not present

## 2024-04-12 DIAGNOSIS — Z743 Need for continuous supervision: Secondary | ICD-10-CM | POA: Diagnosis not present

## 2024-04-12 DIAGNOSIS — I5043 Acute on chronic combined systolic (congestive) and diastolic (congestive) heart failure: Secondary | ICD-10-CM | POA: Diagnosis not present

## 2024-04-12 DIAGNOSIS — Z7985 Long-term (current) use of injectable non-insulin antidiabetic drugs: Secondary | ICD-10-CM | POA: Diagnosis not present

## 2024-04-12 DIAGNOSIS — S199XXA Unspecified injury of neck, initial encounter: Secondary | ICD-10-CM | POA: Diagnosis not present

## 2024-04-12 DIAGNOSIS — R7989 Other specified abnormal findings of blood chemistry: Secondary | ICD-10-CM | POA: Diagnosis not present

## 2024-04-12 DIAGNOSIS — W06XXXA Fall from bed, initial encounter: Secondary | ICD-10-CM | POA: Diagnosis not present

## 2024-04-12 DIAGNOSIS — Z7901 Long term (current) use of anticoagulants: Secondary | ICD-10-CM | POA: Diagnosis not present

## 2024-04-12 DIAGNOSIS — I4891 Unspecified atrial fibrillation: Secondary | ICD-10-CM | POA: Diagnosis not present

## 2024-04-12 DIAGNOSIS — F0390 Unspecified dementia without behavioral disturbance: Secondary | ICD-10-CM | POA: Diagnosis not present

## 2024-04-12 DIAGNOSIS — R9082 White matter disease, unspecified: Secondary | ICD-10-CM | POA: Diagnosis not present

## 2024-04-12 DIAGNOSIS — H353122 Nonexudative age-related macular degeneration, left eye, intermediate dry stage: Secondary | ICD-10-CM | POA: Diagnosis not present

## 2024-04-12 DIAGNOSIS — G2581 Restless legs syndrome: Secondary | ICD-10-CM | POA: Diagnosis not present

## 2024-04-12 DIAGNOSIS — I5022 Chronic systolic (congestive) heart failure: Secondary | ICD-10-CM | POA: Diagnosis not present

## 2024-04-12 DIAGNOSIS — N184 Chronic kidney disease, stage 4 (severe): Secondary | ICD-10-CM | POA: Diagnosis not present

## 2024-04-12 DIAGNOSIS — R000 Tachycardia, unspecified: Secondary | ICD-10-CM | POA: Diagnosis not present

## 2024-04-12 DIAGNOSIS — I2510 Atherosclerotic heart disease of native coronary artery without angina pectoris: Secondary | ICD-10-CM | POA: Diagnosis not present

## 2024-04-12 DIAGNOSIS — Z043 Encounter for examination and observation following other accident: Secondary | ICD-10-CM | POA: Diagnosis present

## 2024-04-12 DIAGNOSIS — H353114 Nonexudative age-related macular degeneration, right eye, advanced atrophic with subfoveal involvement: Secondary | ICD-10-CM | POA: Diagnosis not present

## 2024-04-14 DIAGNOSIS — L89154 Pressure ulcer of sacral region, stage 4: Secondary | ICD-10-CM | POA: Diagnosis not present

## 2024-04-21 DIAGNOSIS — L89154 Pressure ulcer of sacral region, stage 4: Secondary | ICD-10-CM | POA: Diagnosis not present

## 2024-04-21 DIAGNOSIS — E8809 Other disorders of plasma-protein metabolism, not elsewhere classified: Secondary | ICD-10-CM | POA: Diagnosis not present

## 2024-04-21 DIAGNOSIS — Z6831 Body mass index (BMI) 31.0-31.9, adult: Secondary | ICD-10-CM | POA: Diagnosis not present

## 2024-04-24 DIAGNOSIS — I10 Essential (primary) hypertension: Secondary | ICD-10-CM | POA: Diagnosis not present

## 2024-04-25 ENCOUNTER — Other Ambulatory Visit: Payer: Self-pay

## 2024-04-25 ENCOUNTER — Encounter (HOSPITAL_COMMUNITY): Payer: Self-pay

## 2024-04-25 ENCOUNTER — Emergency Department (HOSPITAL_COMMUNITY)

## 2024-04-25 ENCOUNTER — Emergency Department (HOSPITAL_COMMUNITY)
Admission: EM | Admit: 2024-04-25 | Discharge: 2024-04-25 | Disposition: A | Discharge status: Home / Self Care | Attending: Emergency Medicine | Admitting: Emergency Medicine

## 2024-04-25 DIAGNOSIS — Z7901 Long term (current) use of anticoagulants: Secondary | ICD-10-CM | POA: Insufficient documentation

## 2024-04-25 DIAGNOSIS — W19XXXA Unspecified fall, initial encounter: Secondary | ICD-10-CM

## 2024-04-25 DIAGNOSIS — S0990XA Unspecified injury of head, initial encounter: Secondary | ICD-10-CM | POA: Diagnosis not present

## 2024-04-25 DIAGNOSIS — D649 Anemia, unspecified: Secondary | ICD-10-CM | POA: Insufficient documentation

## 2024-04-25 DIAGNOSIS — S199XXA Unspecified injury of neck, initial encounter: Secondary | ICD-10-CM | POA: Diagnosis not present

## 2024-04-25 DIAGNOSIS — Z743 Need for continuous supervision: Secondary | ICD-10-CM | POA: Diagnosis not present

## 2024-04-25 DIAGNOSIS — I6381 Other cerebral infarction due to occlusion or stenosis of small artery: Secondary | ICD-10-CM | POA: Diagnosis not present

## 2024-04-25 DIAGNOSIS — W06XXXA Fall from bed, initial encounter: Secondary | ICD-10-CM | POA: Diagnosis not present

## 2024-04-25 DIAGNOSIS — R7989 Other specified abnormal findings of blood chemistry: Secondary | ICD-10-CM | POA: Diagnosis not present

## 2024-04-25 DIAGNOSIS — Z043 Encounter for examination and observation following other accident: Secondary | ICD-10-CM | POA: Diagnosis present

## 2024-04-25 DIAGNOSIS — F0390 Unspecified dementia without behavioral disturbance: Secondary | ICD-10-CM | POA: Insufficient documentation

## 2024-04-25 DIAGNOSIS — R9082 White matter disease, unspecified: Secondary | ICD-10-CM | POA: Diagnosis not present

## 2024-04-25 DIAGNOSIS — I959 Hypotension, unspecified: Secondary | ICD-10-CM | POA: Diagnosis not present

## 2024-04-25 LAB — COMPREHENSIVE METABOLIC PANEL WITH GFR
ALT: 7 U/L (ref 0–44)
AST: 13 U/L — ABNORMAL LOW (ref 15–41)
Albumin: 2.7 g/dL — ABNORMAL LOW (ref 3.5–5.0)
Alkaline Phosphatase: 120 U/L (ref 38–126)
Anion gap: 10 (ref 5–15)
BUN: 75 mg/dL — ABNORMAL HIGH (ref 8–23)
CO2: 18 mmol/L — ABNORMAL LOW (ref 22–32)
Calcium: 9.3 mg/dL (ref 8.9–10.3)
Chloride: 104 mmol/L (ref 98–111)
Creatinine, Ser: 2.3 mg/dL — ABNORMAL HIGH (ref 0.44–1.00)
GFR, Estimated: 20 mL/min — ABNORMAL LOW (ref >60)
Glucose, Bld: 117 mg/dL — ABNORMAL HIGH (ref 70–99)
Potassium: 3.6 mmol/L (ref 3.5–5.1)
Sodium: 132 mmol/L — ABNORMAL LOW (ref 135–145)
Total Bilirubin: 0.8 mg/dL (ref 0.0–1.2)
Total Protein: 6.6 g/dL (ref 6.5–8.1)

## 2024-04-25 LAB — CBC WITH DIFFERENTIAL/PLATELET
Abs Immature Granulocytes: 0.03 10*3/uL (ref 0.00–0.07)
Basophils Absolute: 0 10*3/uL (ref 0.0–0.1)
Basophils Relative: 0 %
Eosinophils Absolute: 0.1 10*3/uL (ref 0.0–0.5)
Eosinophils Relative: 2 %
HCT: 27 % — ABNORMAL LOW (ref 36.0–46.0)
Hemoglobin: 8.3 g/dL — ABNORMAL LOW (ref 12.0–15.0)
Immature Granulocytes: 0 %
Lymphocytes Relative: 21 %
Lymphs Abs: 1.6 10*3/uL (ref 0.7–4.0)
MCH: 27.2 pg (ref 26.0–34.0)
MCHC: 30.7 g/dL (ref 30.0–36.0)
MCV: 88.5 fL (ref 80.0–100.0)
Monocytes Absolute: 0.7 10*3/uL (ref 0.1–1.0)
Monocytes Relative: 10 %
Neutro Abs: 5.2 10*3/uL (ref 1.7–7.7)
Neutrophils Relative %: 67 %
Platelets: 260 10*3/uL (ref 150–400)
RBC: 3.05 MIL/uL — ABNORMAL LOW (ref 3.87–5.11)
RDW: 19 % — ABNORMAL HIGH (ref 11.5–15.5)
WBC: 7.6 10*3/uL (ref 4.0–10.5)
nRBC: 0 % (ref 0.0–0.2)

## 2024-04-25 NOTE — ED Triage Notes (Signed)
 Pt BIB [facility] from [facility]. Pt had an unwitnessed fall. Pt reports she was in her bed and was trying to reach for something and fell. Unsure if she hit hear head, unknown LOC. Facility reports the pt is on eliquis  but there was no paperwork to verify. A&ox3 at baseline Hx dementia and eliquis  for Afib.   EMS 110/80 BP 98% RA

## 2024-04-25 NOTE — ED Notes (Signed)
 [facility] called for transport [facility]

## 2024-04-25 NOTE — ED Notes (Signed)
 Trauma Response Nurse Documentation   [name] is a 83 y.o. female arriving to [facility] ED via [facility]  On Eliquis  (apixaban ) daily. Trauma was activated as a Level 2 by Eddye Goodie based on the following trauma criteria Elderly patients > 65 with head trauma on anti-coagulation (excluding ASA).  Patient cleared for CT by Dr. Luberta Ruse. Pt transported to CT with trauma response nurse present to monitor. RN remained with the patient throughout their absence from the department for clinical observation.   GCS 15.  Trauma MD Arrival Time: N/A.  History   Past Medical History:  Diagnosis Date   Acute cystitis with hematuria 09/01/2020   ANXIETY 01/31/2009   Qualifier: Diagnosis of  By: Adelle Agent, CNA, Christy     Arthritis    qwhere (03/30/2016)   Bilateral carpal tunnel syndrome 02/13/2021   CAD (coronary artery disease) 05/2008   a. s/p CABG in 2009.   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 Postoperative in 2009;  S/P transesophageal echocardiography-guided cardioversion, previously on Amiodarone  and Coumadin. b. recurrent in April 2013 -> pt elected rate control strategy, initially Coumadin -> had stroke with  subtherapeutic INR in 03/2016 and transitioned to Eliquis .   Macular degeneration    Melena 01/10/2021   Morbid obesity (HCC) 03/26/2017   OSA on CPAP    severe OSA with AHI 34/hr   Proliferative diabetic retinopathy of right eye (HCC) 04/17/2021   Found today at delivery of PRP for what was thought to be severe NPDR OD   Rectal bleeding    Thrombocytopenia (HCC)      Past Surgical History:  Procedure Laterality Date   APPENDECTOMY  1946   BACK SURGERY     CARPAL TUNNEL RELEASE Left 03/28/2021   Procedure: LEFT CARPAL TUNNEL RELEASE;  Surgeon: Lyanne Sample, MD;  Location: [facility] OR;  Service: Orthopedics;  Laterality: Left;  60 MIN   CARPAL TUNNEL RELEASE Right 09/15/2021   Procedure: RIGHT CARPAL TUNNEL RELEASE;  Surgeon: Lyanne Sample, MD;  Location: [facility] OR;  Service: Orthopedics;  Laterality: Right;   CATARACT EXTRACTION Right 2012   COLONOSCOPY WITH PROPOFOL  Left 03/27/2017   Procedure: COLONOSCOPY WITH PROPOFOL ;  Surgeon: Genell Ken, MD;  Location: [facility];  Service: Gastroenterology;  Laterality: Left;   CORONARY ANGIOPLASTY WITH STENT PLACEMENT  2009   put 2 stents in   EXCISION METACARPAL MASS Left 03/28/2021   Procedure: EXCISION MASS LEFT THUMB;  Surgeon: Lyanne Sample, MD;  Location: [facility] OR;  Service: Orthopedics;  Laterality: Left;   EYE SURGERY     bilateral cataract   HERNIA REPAIR     IR LUMBAR DISC ASPIRATION W/IMG GUIDE  10/08/2018   JOINT REPLACEMENT  TEE WITH CARDIOVERSION     Postoperative in 2009;  S/P transesophageal echocardiography-guided cardioversion,   TOTAL KNEE ARTHROPLASTY Right 1994   VENTRAL HERNIA REPAIR  10/1999   With mesh       Initial Focused Assessment (If applicable, or please see trauma documentation): Airway-- intact, no visible obstruction Breathing-- spontaneous, unlabored Circulation-- no obvious bleeding noted  CT's Completed:   CT Head and CT C-Spine   Interventions:  See event summary  Plan for disposition:  Discharge home    Consults completed:  none at 0647.  Event Summary: Patient brought in by [facility], patient had an unwitnessed fall at facility this morning, states she struck her head. On arrival, patient alert and oriented. Patient transferred from EMS stretcher to hospital stretcher. Manual BP obtained. Lab work obtained. Patient to CT with TRN, Primary RN, ED tech. CT head, ct c-spine completed. Patient back to exam room at this time.  MTP Summary (If applicable):  N/A  Bedside handoff with ED RN Raoul Byes.    Brunetta Capes  Trauma Response RN  Please call TRN at [phone] for further assistance.

## 2024-04-26 NOTE — ED Provider Notes (Signed)
 [facility] AT [facility] Provider Note   CSN: [id number] Arrival date & time: 04/25/24  4098     Patient presents with: [name]   [name] is a 83 y.o. female.   83 yo F who resides at [facility]. On eliquis  for Afib. Was rolling over reaching for something and rolle dout of bed. Hit her head a little bit. They sent her here for evaluation. No syncope. No pain elsewhere. No headache now.    Fall       Prior to Admission medications   Medication Sig Start Date End Date Taking? Authorizing Provider  acetaminophen  (TYLENOL  8 HOUR) 650 MG CR tablet Take 1 tablet (650 mg total) by mouth every 8 (eight) hours as needed for pain. 11/03/19   Catheryn Cluck, MD  amiodarone  (PACERONE ) 200 MG tablet Take 1 tablet (200 mg total) by mouth daily. 03/25/24   Milford, Arlice Bene, FNP  apixaban  (ELIQUIS ) 2.5 MG TABS tablet Take 1 tablet (2.5 mg total) by mouth 2 (two) times daily. 12/06/23   Dema Filler, MD  atorvastatin  (LIPITOR) 40 MG tablet Take 1 tablet (40 mg total) by mouth daily. 01/21/24   Darlis Eisenmenger, MD  Blood Glucose Monitoring Suppl (ONE TOUCH ULTRA 2) w/Device KIT USE TO CHECK BLOOD SUGAR UPTO 3 TIMES A DAY 12/06/23   Dema Filler, MD  brimonidine  (ALPHAGAN ) 0.2 % ophthalmic solution Place 1 drop into both eyes 2 (two) times daily. 01/15/24   [provider]  Cholecalciferol  25 MCG (1000 UT) tablet Take 1,000 Units by mouth daily.    [provider]  clotrimazole  (LOTRIMIN  AF) 1 % cream Apply 1 Application topically 2 (two) times daily. 01/06/24   Jonne Netters, MD  diclofenac  Sodium (VOLTAREN ) 1 % GEL Apply 2 g topically as needed. 12/06/23   Dema Filler, MD  docusate sodium  (COLACE) 100 MG capsule Take 200 mg by mouth at bedtime.    [provider]  Dulaglutide  (TRULICITY ) 1.5 MG/0.5ML SOAJ Inject 1.5 mg into the skin once a week. 01/29/24   Dema Filler, MD  ferrous sulfate  325 (65 FE) MG tablet TAKE 1 TABLET BY MOUTH  EVERY DAY WITH BREAKFAST 09/16/20   Edsel Grace, MD  glucose blood (ONE TOUCH ULTRA TEST) test strip Use three times a day 09/15/20   Edsel Grace, MD  glucose blood test strip Use as instructed to check blood glucose up to 4x daily 10/10/22   Edsel Grace, MD  Lancets [facility] DELICA PLUS [place]) MISC Please use to check blood sugar up to four times daily. 10/10/22   Edsel Grace, MD  lidocaine  (LIDODERM ) 5 % Place 1 patch onto the skin daily. Remove & Discard patch within 12 hours or as directed by MD Patient taking differently: Place 1 patch onto the skin daily as needed. Remove & Discard patch within 12 hours or as directed by MD 12/23/23   Dema Filler, MD  megestrol  (MEGACE ) 40 MG tablet Take 1 tablet (40 mg total) by mouth daily. 01/29/24   Dema Filler, MD  metolazone  (ZAROXOLYN ) 2.5 MG tablet Take 1 tablet (2.5 mg total) by mouth daily. Patient not taking: Reported on 03/25/2024 02/26/24 05/26/24  Ruddy Corral M, PA-C  metoprolol  succinate (TOPROL -XL) 25 MG 24 hr tablet Take 12.5 mg by mouth daily.    [provider]  Multiple Vitamins-Minerals (PRESERVISION AREDS 2+MULTI VIT PO) Take 1 capsule by mouth in the morning and at bedtime.    [provider]  nitroGLYCERIN  (  NITROSTAT ) 0.4 MG SL tablet Place 1 tablet (0.4 mg total) under the tongue every 5 (five) minutes as needed for chest pain (up to 3 doses). 12/19/23   Lavada Porteous, DO  nystatin  (MYCOSTATIN /NYSTOP ) powder Apply 1 Application topically 3 (three) times daily. Until resolution. 01/29/24   Dema Filler, MD  polyethylene glycol (MIRALAX  / GLYCOLAX ) 17 g packet Take 17 g by mouth 2 (two) times daily. 02/07/24   Goble Last, MD  potassium chloride  SA (KLOR-CON  M) 20 MEQ tablet Take 1 tablet (20 mEq total) by mouth daily. 02/26/24   Ruddy Corral M, PA-C  rOPINIRole  (REQUIP ) 0.5 MG tablet Take 2 tablets (1 mg total) by mouth at bedtime. 12/06/23   Dema Filler, MD  senna (SENOKOT) 8.6 MG TABS  tablet Take 1 tablet (8.6 mg total) by mouth 2 (two) times daily. 02/07/24   Goble Last, MD  torsemide  (DEMADEX ) 20 MG tablet Take 40 mg by mouth daily. Take 100 mg q am and 80 mg qpm    [provider]  traMADol  (ULTRAM ) 50 MG tablet Take 1 tablet (50 mg total) by mouth every 12 (twelve) hours as needed for severe pain (pain score 7-10) (Can take every 6 hours for breakthrough pain.). 01/13/24   Dema Filler, MD  vitamin B-12 (CYANOCOBALAMIN ) 1000 MCG tablet Take 1 tablet (1,000 mcg total) by mouth daily. 09/16/20   Edsel Grace, MD  zinc  oxide 20 % ointment Apply 1 application topically as needed (buttocks irritation). 10/12/21   Edsel Grace, MD    Allergies: Benazepril, Cymbalta  [duloxetine  hcl], Ozempic  (0.25 or 0.5 mg-dose) [semaglutide (0.25 or 0.5mg -dos)], Tape, Angiotensin receptor blockers, Bactrim [sulfamethoxazole-trimethoprim], and Fe-succ-c-thre-b12-des stomach    Review of Systems  Updated Vital Signs BP (!) 100/43   Pulse 85   Temp 98 F (36.7 C) (Oral)   Resp 16   Ht 5' 7 (1.702 m)   Wt 110.7 kg   LMP  (LMP Unknown)   SpO2 100%   BMI 38.22 kg/m   Physical Exam Vitals and nursing note reviewed.  Constitutional:      Appearance: She is well-developed.  HENT:     Head: Normocephalic and atraumatic.   Cardiovascular:     Rate and Rhythm: Normal rate and regular rhythm.  Pulmonary:     Effort: No respiratory distress.     Breath sounds: No stridor.  Abdominal:     General: There is no distension.   Musculoskeletal:        General: No swelling or tenderness. Normal range of motion.     Cervical back: Normal range of motion.     Comments: No cervical spine tenderness, thoracic spine tenderness or Lumbar spine tenderness.  No tenderness or pain with palpation and full ROM of all joints in upper and lower extremities.  No ecchymosis or other signs of trauma on back or extremities.  No Pain with AP or lateral compression of ribs.  No Paracervical ttp,  paraspinal ttp    Skin:    General: Skin is warm and dry.   Neurological:     Mental Status: She is alert.     (all labs ordered are listed, but only abnormal results are displayed) Labs Reviewed  CBC WITH DIFFERENTIAL/PLATELET - Abnormal; Notable for the following components:      Result Value   RBC 3.05 (*)    Hemoglobin 8.3 (*)    HCT 27.0 (*)    RDW 19.0 (*)    All other components within normal limits  COMPREHENSIVE  METABOLIC PANEL WITH GFR - Abnormal; Notable for the following components:   Sodium 132 (*)    CO2 18 (*)    Glucose, Bld 117 (*)    BUN 75 (*)    Creatinine, Ser 2.30 (*)    Albumin 2.7 (*)    AST 13 (*)    GFR, Estimated 20 (*)    All other components within normal limits    EKG: None  Radiology: CT Head Wo Contrast Result Date: 04/25/2024 CLINICAL DATA:  Head and neck trauma EXAM: CT HEAD WITHOUT CONTRAST CT CERVICAL SPINE WITHOUT CONTRAST TECHNIQUE: Multidetector CT imaging of the head and cervical spine was performed following the standard protocol without intravenous contrast. Multiplanar CT image reconstructions of the cervical spine were also generated. RADIATION DOSE REDUCTION: This exam was performed according to the departmental dose-optimization program which includes automated exposure control, adjustment of the mA and/or kV according to patient size and/or use of iterative reconstruction technique. COMPARISON:  01/29/2024 FINDINGS: CT HEAD FINDINGS Brain: No evidence of acute infarction, hemorrhage, hydrocephalus, extra-axial collection or mass lesion/mass effect. Periventricular and deep white matter hypodensity. Lacunar infarction of the right basal ganglia. Vascular: No hyperdense vessel or unexpected calcification. Skull: Normal. Negative for fracture or focal lesion. Sinuses/Orbits: No acute finding. Other: None. CT CERVICAL SPINE FINDINGS Alignment: Normal cervical lordosis. Minimal degenerative anterolisthesis of C6 on C7. Skull base and  vertebrae: No acute fracture. No primary bone lesion or focal pathologic process. Soft tissues and spinal canal: No prevertebral fluid or swelling. No visible canal hematoma. Disc levels: Moderate multilevel cervical disc degenerative disease. Anterior bridging osteophytosis in keeping with DISH. Upper chest: Negative. Other: None. IMPRESSION: 1. No acute intracranial pathology. Advanced small-vessel white matter disease and lacunar infarction of the right basal ganglia. 2. No fracture or static subluxation of the cervical spine. 3. Moderate multilevel cervical disc degenerative disease. Anterior bridging osteophytosis in keeping with DISH. Electronically Signed   By: Fredricka Jenny M.D.   On: 04/25/2024 06:29   CT Cervical Spine Wo Contrast Result Date: 04/25/2024 CLINICAL DATA:  Head and neck trauma EXAM: CT HEAD WITHOUT CONTRAST CT CERVICAL SPINE WITHOUT CONTRAST TECHNIQUE: Multidetector CT imaging of the head and cervical spine was performed following the standard protocol without intravenous contrast. Multiplanar CT image reconstructions of the cervical spine were also generated. RADIATION DOSE REDUCTION: This exam was performed according to the departmental dose-optimization program which includes automated exposure control, adjustment of the mA and/or kV according to patient size and/or use of iterative reconstruction technique. COMPARISON:  01/29/2024 FINDINGS: CT HEAD FINDINGS Brain: No evidence of acute infarction, hemorrhage, hydrocephalus, extra-axial collection or mass lesion/mass effect. Periventricular and deep white matter hypodensity. Lacunar infarction of the right basal ganglia. Vascular: No hyperdense vessel or unexpected calcification. Skull: Normal. Negative for fracture or focal lesion. Sinuses/Orbits: No acute finding. Other: None. CT CERVICAL SPINE FINDINGS Alignment: Normal cervical lordosis. Minimal degenerative anterolisthesis of C6 on C7. Skull base and vertebrae: No acute fracture. No  primary bone lesion or focal pathologic process. Soft tissues and spinal canal: No prevertebral fluid or swelling. No visible canal hematoma. Disc levels: Moderate multilevel cervical disc degenerative disease. Anterior bridging osteophytosis in keeping with DISH. Upper chest: Negative. Other: None. IMPRESSION: 1. No acute intracranial pathology. Advanced small-vessel white matter disease and lacunar infarction of the right basal ganglia. 2. No fracture or static subluxation of the cervical spine. 3. Moderate multilevel cervical disc degenerative disease. Anterior bridging osteophytosis in keeping with DISH. Electronically Signed  By: Fredricka Jenny M.D.   On: 04/25/2024 06:29     Procedures   Medications Ordered in the ED - No data to display                                  Medical Decision Making Amount and/or Complexity of Data Reviewed Labs: ordered. Radiology: ordered.   As she hit her head after a fall on eliquis , ct's done and on my personal interetation were reassuring for no obvious traumatic injuries. Labs ok. Feeling baseline. Stable for d/c.    Final diagnoses:  Fall, initial encounter  Elevated serum creatinine  Chronic anemia    ED Discharge Orders     None          Satomi Buda, Reymundo Caulk, MD 04/26/24 [phone]

## 2024-04-28 ENCOUNTER — Telehealth (HOSPITAL_COMMUNITY): Payer: Self-pay | Admitting: Cardiology

## 2024-04-28 DIAGNOSIS — L89154 Pressure ulcer of sacral region, stage 4: Secondary | ICD-10-CM | POA: Diagnosis not present

## 2024-04-28 NOTE — Telephone Encounter (Signed)
 Son aware of zio results   She has been started on amiodarone  due to frequent PVCs and fall in EF. Would repeat Zio monitor x 1 week in about 3 months.

## 2024-04-29 ENCOUNTER — Ambulatory Visit: Admitting: Family Medicine

## 2024-04-29 DIAGNOSIS — Z794 Long term (current) use of insulin: Secondary | ICD-10-CM | POA: Diagnosis not present

## 2024-04-29 DIAGNOSIS — E1122 Type 2 diabetes mellitus with diabetic chronic kidney disease: Secondary | ICD-10-CM | POA: Diagnosis not present

## 2024-04-29 DIAGNOSIS — I5022 Chronic systolic (congestive) heart failure: Secondary | ICD-10-CM | POA: Diagnosis not present

## 2024-04-29 DIAGNOSIS — L89154 Pressure ulcer of sacral region, stage 4: Secondary | ICD-10-CM | POA: Diagnosis not present

## 2024-04-29 DIAGNOSIS — N179 Acute kidney failure, unspecified: Secondary | ICD-10-CM | POA: Diagnosis not present

## 2024-04-29 DIAGNOSIS — I4811 Longstanding persistent atrial fibrillation: Secondary | ICD-10-CM | POA: Diagnosis not present

## 2024-04-29 DIAGNOSIS — Z7901 Long term (current) use of anticoagulants: Secondary | ICD-10-CM | POA: Diagnosis not present

## 2024-04-29 DIAGNOSIS — Z7985 Long-term (current) use of injectable non-insulin antidiabetic drugs: Secondary | ICD-10-CM | POA: Diagnosis not present

## 2024-04-29 DIAGNOSIS — N184 Chronic kidney disease, stage 4 (severe): Secondary | ICD-10-CM | POA: Diagnosis not present

## 2024-05-01 DIAGNOSIS — R2681 Unsteadiness on feet: Secondary | ICD-10-CM | POA: Diagnosis not present

## 2024-05-01 DIAGNOSIS — L03115 Cellulitis of right lower limb: Secondary | ICD-10-CM | POA: Diagnosis not present

## 2024-05-01 DIAGNOSIS — I5043 Acute on chronic combined systolic (congestive) and diastolic (congestive) heart failure: Secondary | ICD-10-CM | POA: Diagnosis not present

## 2024-05-01 DIAGNOSIS — M6281 Muscle weakness (generalized): Secondary | ICD-10-CM | POA: Diagnosis not present

## 2024-05-06 ENCOUNTER — Telehealth: Payer: Self-pay | Admitting: Family Medicine

## 2024-05-06 DIAGNOSIS — I10 Essential (primary) hypertension: Secondary | ICD-10-CM | POA: Diagnosis not present

## 2024-05-06 NOTE — Telephone Encounter (Addendum)
 Attempted to schedule follow up with patient without answer, and VM was full.  Green team: can we call her (or her son if not answering) to get her a hospital follow up? Thanks so much!  ----- Message from Russell W sent at 05/04/2024  1:12 PM EDT ----- Regarding: RE: follow up needed Tried to call patient to scheduled, no answer VM full. Thanks! ----- Message ----- From: Tharon Lung, MD Sent: 05/03/2024  12:48 AM EDT To: [facility] Subject: follow up needed                               Can we get her scheduled for a follow up? I have not seen her since discharge, and she needs close f/u! Thank you!

## 2024-05-07 ENCOUNTER — Ambulatory Visit: Payer: Medicare HMO

## 2024-05-07 ENCOUNTER — Telehealth: Payer: Self-pay | Admitting: Family Medicine

## 2024-05-07 VITALS — Ht 69.0 in | Wt 242.5 lb

## 2024-05-07 DIAGNOSIS — Z0000 Encounter for general adult medical examination without abnormal findings: Secondary | ICD-10-CM

## 2024-05-07 NOTE — Telephone Encounter (Addendum)
 Thank you for letting me know!  ----- Message from Nurse Roz DEL sent at 05/07/2024 11:44 AM EDT ----- Regarding: Hospital F/U I wanted to inform you that I completed AWV for this patient this morning.  This patient is currently in SNF and will be discharged 05/12/2024 according to her son.  I advised son that you were trying to get incontact with the patient to schedule a hospital f/u.  He stated that as soon as he gets patient back home with home health, he will call to schedule.  Shenika N. Tomie, LPN [facility] Annual Wellness Team Direct Dial: [phone]

## 2024-05-07 NOTE — Telephone Encounter (Signed)
 Called patient x two and no answer.  Voice mail is full so no ability to leave message.   [name], CMA

## 2024-05-07 NOTE — Patient Instructions (Signed)
 [name] , Thank you for taking time out of your busy schedule to complete your Annual Wellness Visit with me. I enjoyed our conversation and look forward to speaking with you again next year. I, as well as your care team,  appreciate your ongoing commitment to your health goals. Please review the following plan we discussed and let me know if I can assist you in the future. Your Game plan/ To Do List    Referrals: If you haven't heard from the office you've been referred to, please reach out to them at the phone provided.   Follow up Visits: Next Medicare AWV with our clinical staff: 05/10/2025 at 2:20 pm phone visit with Nurse Health Advisor   Have you seen your provider in the last 6 months (3 months if uncontrolled diabetes)? Yes Next Office Visit with your provider: Patient's son will call to schedule hospital follow up with Dr. Elna [name]  Clinician Recommendations:  Aim for 30 minutes of exercise or brisk walking, 6-8 glasses of water, and 5 servings of fruits and vegetables each day.       This is a list of the screening recommended for you and due dates:  Health Maintenance  Topic Date Due   Complete foot exam   06/15/2023   Eye exam for diabetics  10/04/2023   Yearly kidney health urinalysis for diabetes  05/20/2024   COVID-19 Vaccine (6 - Moderna risk 2024-25 season) 10/02/2024*   Flu Shot  06/12/2024   Hemoglobin A1C  08/01/2024   Yearly kidney function blood test for diabetes  04/25/2025   Medicare Annual Wellness Visit  05/07/2025   DTaP/Tdap/Td vaccine (2 - Td or Tdap) 10/15/2027   Pneumococcal Vaccine for age over 56  Completed   DEXA scan (bone density measurement)  Completed   Zoster (Shingles) Vaccine  Completed   Hepatitis B Vaccine  Aged Out   HPV Vaccine  Aged Out   Meningitis B Vaccine  Aged Out  *Topic was postponed. The date shown is not the original due date.    Advanced directives: (In Chart) A copy of your advanced directives are scanned into your  chart should your provider ever need it. Advance Care Planning is important because it:  [x]  Makes sure you receive the medical care that is consistent with your values, goals, and preferences  [x]  It provides guidance to your family and loved ones and reduces their decisional burden about whether or not they are making the right decisions based on your wishes.  Follow the link provided in your after visit summary or read over the paperwork we have mailed to you to help you started getting your Advance Directives in place. If you need assistance in completing these, please reach out to us  so that we can help you!  See attachments for Preventive Care and Fall Prevention Tips.

## 2024-05-07 NOTE — Progress Notes (Signed)
 Because this visit was a virtual/telehealth visit,  certain criteria was not obtained, such a blood pressure, CBG if applicable, and timed get up and go. Any medications not marked as taking were not mentioned during the medication reconciliation part of the visit. Any vitals not documented were not able to be obtained due to this being a telehealth visit or patient was unable to self-report a recent blood pressure reading due to a lack of equipment at home via telehealth. Vitals that have been documented are verbally provided by the patient.   Subjective:   [name] is a 83 y.o. who presents for a Medicare Wellness preventive visit.  As a reminder, Annual Wellness Visits don't include a physical exam, and some assessments may be limited, especially if this visit is performed virtually. We may recommend an in-person follow-up visit with your provider if needed.  Visit Complete: Virtual I connected with  [name] on 05/07/24 by a audio enabled telemedicine application and verified that I am speaking with the correct person using two identifiers.  Patient Location: Skilled Nursing Facility  Provider Location: Office/Clinic  I discussed the limitations of evaluation and management by telemedicine. The patient expressed understanding and agreed to proceed.  Vital Signs: Because this visit was a virtual/telehealth visit, some criteria may be missing or patient reported. Any vitals not documented were not able to be obtained and vitals that have been documented are patient reported.  VideoDeclined- This patient declined Librarian, academic. Therefore the visit was completed with audio only.  Persons Participating in Visit: Son and patient was present during visit.  AWV Questionnaire: No: Patient Medicare AWV questionnaire was not completed prior to this visit.  Cardiac Risk Factors include: advanced age (>62men, >55 women);diabetes  mellitus;dyslipidemia;hypertension;obesity (BMI >30kg/m2);sedentary lifestyle     Objective:    Today's Vitals   05/07/24 1048  Weight: 242 lb 8.1 oz (110 kg)  Height: 5' 9 (1.753 m)  PainSc: 7   PainLoc: Generalized   Body mass index is 35.81 kg/m.     05/07/2024   10:52 AM 04/25/2024    6:21 AM 01/30/2024   12:12 PM 01/29/2024    1:55 PM 11/27/2023   11:32 AM 11/26/2023    7:33 PM 10/08/2023   10:43 AM  Advanced Directives  Does Patient Have a Medical Advance Directive? Yes Yes Yes Yes Yes Yes No  Type of Estate agent of [place];Living will Healthcare Power of [place];Living will Healthcare Power of [place];Living will  Living will Living will   Does patient want to make changes to medical advance directive? No - Patient declined  Yes (Inpatient - patient defers changing a medical advance directive and declines information at this time) No - Patient declined     Copy of Healthcare Power of Attorney in Chart? Yes - validated most recent copy scanned in chart (See row information)  Yes - validated most recent copy scanned in chart (See row information)      Would patient like information on creating a medical advance directive?     No - Patient declined  No - Patient declined    Current Medications (verified) Outpatient Encounter Medications as of 05/07/2024  Medication Sig   acetaminophen  (TYLENOL  8 HOUR) 650 MG CR tablet Take 1 tablet (650 mg total) by mouth every 8 (eight) hours as needed for pain.   amiodarone  (PACERONE ) 200 MG tablet Take 1 tablet (200 mg total) by mouth daily.   apixaban  (ELIQUIS ) 2.5  MG TABS tablet Take 1 tablet (2.5 mg total) by mouth 2 (two) times daily.   atorvastatin  (LIPITOR) 40 MG tablet Take 1 tablet (40 mg total) by mouth daily.   Blood Glucose Monitoring Suppl (ONE TOUCH ULTRA 2) w/Device KIT USE TO CHECK BLOOD SUGAR UPTO 3 TIMES A DAY   brimonidine  (ALPHAGAN ) 0.2 % ophthalmic solution Place 1 drop into both eyes 2 (two)  times daily.   Cholecalciferol  25 MCG (1000 UT) tablet Take 1,000 Units by mouth daily.   clotrimazole  (LOTRIMIN  AF) 1 % cream Apply 1 Application topically 2 (two) times daily.   diclofenac  Sodium (VOLTAREN ) 1 % GEL Apply 2 g topically as needed.   docusate sodium  (COLACE) 100 MG capsule Take 200 mg by mouth at bedtime.   Dulaglutide  (TRULICITY ) 1.5 MG/0.5ML SOAJ Inject 1.5 mg into the skin once a week.   ferrous sulfate  325 (65 FE) MG tablet TAKE 1 TABLET BY MOUTH EVERY DAY WITH BREAKFAST   glucose blood (ONE TOUCH ULTRA TEST) test strip Use three times a day   glucose blood test strip Use as instructed to check blood glucose up to 4x daily   Lancets (ONETOUCH DELICA PLUS LANCET33G) MISC Please use to check blood sugar up to four times daily.   lidocaine  (LIDODERM ) 5 % Place 1 patch onto the skin daily. Remove & Discard patch within 12 hours or as directed by MD (Patient taking differently: Place 1 patch onto the skin daily as needed. Remove & Discard patch within 12 hours or as directed by MD)   megestrol  (MEGACE ) 40 MG tablet Take 1 tablet (40 mg total) by mouth daily.   metolazone  (ZAROXOLYN ) 2.5 MG tablet Take 1 tablet (2.5 mg total) by mouth daily. (Patient not taking: Reported on 03/25/2024)   metoprolol  succinate (TOPROL -XL) 25 MG 24 hr tablet Take 12.5 mg by mouth daily.   Multiple Vitamins-Minerals (PRESERVISION AREDS 2+MULTI VIT PO) Take 1 capsule by mouth in the morning and at bedtime.   nitroGLYCERIN  (NITROSTAT ) 0.4 MG SL tablet Place 1 tablet (0.4 mg total) under the tongue every 5 (five) minutes as needed for chest pain (up to 3 doses).   nystatin  (MYCOSTATIN /NYSTOP ) powder Apply 1 Application topically 3 (three) times daily. Until resolution.   polyethylene glycol (MIRALAX  / GLYCOLAX ) 17 g packet Take 17 g by mouth 2 (two) times daily.   potassium chloride  SA (KLOR-CON  M) 20 MEQ tablet Take 1 tablet (20 mEq total) by mouth daily.   rOPINIRole  (REQUIP ) 0.5 MG tablet Take 2 tablets  (1 mg total) by mouth at bedtime.   senna (SENOKOT) 8.6 MG TABS tablet Take 1 tablet (8.6 mg total) by mouth 2 (two) times daily.   torsemide  (DEMADEX ) 20 MG tablet Take 40 mg by mouth daily. Take 100 mg q am and 80 mg qpm   traMADol  (ULTRAM ) 50 MG tablet Take 1 tablet (50 mg total) by mouth every 12 (twelve) hours as needed for severe pain (pain score 7-10) (Can take every 6 hours for breakthrough pain.).   vitamin B-12 (CYANOCOBALAMIN ) 1000 MCG tablet Take 1 tablet (1,000 mcg total) by mouth daily.   zinc  oxide 20 % ointment Apply 1 application topically as needed (buttocks irritation).   No facility-administered encounter medications on file as of 05/07/2024.    Allergies (verified) Benazepril, Cymbalta  [duloxetine  hcl], Ozempic  (0.25 or 0.5 mg-dose) [semaglutide (0.25 or 0.5mg -dos)], Tape, Angiotensin receptor blockers, Bactrim [sulfamethoxazole-trimethoprim], and Fe-succ-c-thre-b12-des stomach   History: Past Medical History:  Diagnosis Date   Acute cystitis with  hematuria 09/01/2020   ANXIETY 01/31/2009   Qualifier: Diagnosis of  By: Leana, CNA, Christy     Arthritis    qwhere (03/30/2016)   Bilateral carpal tunnel syndrome 02/13/2021   CAD (coronary artery disease) 05/2008   a. s/p CABG in 2009.   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 Postoperative in 2009;  S/P transesophageal echocardiography-guided cardioversion, previously on Amiodarone  and Coumadin. b. recurrent in April 2013 -> pt elected rate control strategy, initially Coumadin -> had stroke with subtherapeutic INR in 03/2016 and transitioned to Eliquis .   Macular degeneration    Melena 01/10/2021   Morbid obesity (HCC) 03/26/2017   OSA on CPAP    severe OSA with AHI 34/hr   Proliferative diabetic retinopathy of right eye (HCC) 04/17/2021   Found today at delivery of PRP for what was thought to be severe NPDR OD   Rectal bleeding    Thrombocytopenia (HCC)    Past Surgical History:  Procedure Laterality Date   APPENDECTOMY  1946   BACK SURGERY     CARPAL TUNNEL RELEASE Left 03/28/2021   Procedure: LEFT CARPAL TUNNEL RELEASE;  Surgeon: Murrell Kuba, MD;  Location: [facility] OR;  Service: Orthopedics;  Laterality: Left;  60 MIN   CARPAL TUNNEL RELEASE Right 09/15/2021   Procedure: RIGHT CARPAL TUNNEL RELEASE;  Surgeon: Murrell Kuba, MD;  Location: [facility] OR;  Service: Orthopedics;  Laterality: Right;   CATARACT EXTRACTION Right 2012   COLONOSCOPY WITH PROPOFOL  Left 03/27/2017   Procedure: COLONOSCOPY WITH PROPOFOL ;  Surgeon: Saintclair Jasper, MD;  Location[facility] ENDOSCOPY;  Service: Gastroenterology;  Laterality: Left;   CORONARY ANGIOPLASTY WITH STENT PLACEMENT  2009   put 2 stents in   EXCISION METACARPAL MASS Left 03/28/2021   Procedure: EXCISION MASS LEFT THUMB;  Surgeon: Murrell Kuba, MD;  Location: [facility] OR;  Service: Orthopedics;  Laterality: Left;   EYE SURGERY     bilateral cataract   HERNIA REPAIR     IR LUMBAR DISC ASPIRATION W/IMG GUIDE  10/08/2018   JOINT REPLACEMENT     TEE WITH CARDIOVERSION     Postoperative in 2009;  S/P transesophageal echocardiography-guided cardioversion,   TOTAL KNEE ARTHROPLASTY Right 1994   VENTRAL HERNIA REPAIR  10/1999    With mesh   Family History  Problem Relation Age of Onset   Clotting disorder Mother        Cerebral hemorrhage   Other Mother        cerebral hemorrhage   Emphysema Father        COD   Lung cancer Brother    Heart disease Neg Hx    Social History   Socioeconomic History   Marital status: Widowed  Spouse name: Not on file   Number of children: 2   Years of education: 76   Highest education level: Not on file  Occupational History    Comment: retired  Tobacco Use   Smoking status: Never    Passive exposure: Never   Smokeless tobacco: Never  Vaping Use   Vaping status: Never Used  Substance and Sexual Activity   Alcohol use: No   Drug use: Never   Sexual activity: Not Currently  Other Topics Concern   Not on file  Social History Narrative   Widowed   01/27/21 Lives with son,  in [place]   2 children   Not routinely exercising   Social Drivers of Health   Financial Resource Strain: Low Risk  (05/07/2024)   Overall Financial Resource Strain (CARDIA)    Difficulty of Paying Living Expenses: Not hard at all  Food Insecurity: No Food Insecurity (05/07/2024)   Hunger Vital Sign    Worried About Running Out of Food in the Last Year: Never true    Ran Out of Food in the Last Year: Never true  Transportation Needs: No Transportation Needs (05/07/2024)   PRAPARE - Administrator, Civil Service (Medical): No    Lack of Transportation (Non-Medical): No  Physical Activity: Inactive (05/07/2024)   Exercise Vital Sign    Days of Exercise per Week: 0 days    Minutes of Exercise per Session: 0 min  Stress: No Stress Concern Present (05/07/2024)   [employer] of Occupational Health - Occupational Stress Questionnaire    Feeling of Stress: Not at all  Social Connections: Socially Isolated (05/07/2024)   Social Connection and Isolation Panel    Frequency of Communication with Friends and Family: More than three times a week    Frequency of Social Gatherings  with Friends and Family: Once a week    Attends Religious Services: Never    Database administrator or Organizations: No    Attends Banker Meetings: Never    Marital Status: Widowed    Tobacco Counseling Counseling given: Not Answered    Clinical Intake:  Pre-visit preparation completed: Yes  Pain : 0-10 Pain Score: 7  Pain Type: Chronic pain Pain Location: Generalized     BMI - recorded: 35.81 Nutritional Status: BMI > 30  Obese Nutritional Risks: None Diabetes: Yes CBG done?: No Did pt. bring in CBG monitor from home?: No  Lab Results  Component Value Date   HGBA1C 7.8 (H) 01/30/2024   HGBA1C 6.7 12/19/2023   HGBA1C 6.4 10/08/2023     How often do you need to have someone help you when you read instructions, pamphlets, or other written materials from your doctor or pharmacy?: 1 - Never  Interpreter Needed?: No  Information entered by :: Roz Fuller, LPN.   Activities of Daily Living     05/07/2024   10:52 AM 01/30/2024   12:12 PM  In your present state of health, do you have any difficulty performing the following activities:  Hearing? 0 1  Vision? 1 1  Difficulty concentrating or making decisions? 1 1  Walking or climbing stairs? 1   Dressing or bathing? 1   Doing errands, shopping? 1 1  Preparing Food and eating ? N   Using the Toilet? Y   In the past six months, have you accidently leaked urine? Y   Do you have problems with loss of bowel control? Y   Managing your Medications? Y  Managing your Finances? Y   Housekeeping or managing your Housekeeping? Y     Patient Care Team: Tharon Lung, MD as PCP - General (Family Medicine) Shlomo Wilbert SAUNDERS, MD as PCP - Cardiology (Cardiology) Rolan Ezra RAMAN, MD as PCP - Advanced Heart Failure (Cardiology) Elner Arley LABOR, MD as Consulting Physician (Retina Ophthalmology) Gaynel Delon CROME, DPM as Consulting Physician (Podiatry) Josefina Chew, MD as Consulting Physician (Orthopedic  Surgery) Octavia Bruckner, MD as Consulting Physician (Ophthalmology)  I have updated your Care Teams any recent Medical Services you may have received from other providers in the past year.     Assessment:   This is a routine wellness examination for [name].  Hearing/Vision screen Hearing Screening - Comments:: Denies hearing difficulties.  Vision Screening - Comments:: Wears rx glasses - up to date with routine eye exams with Medford Octavia, MD and Arley Elner, MD.    Goals Addressed             This Visit's Progress    05/07/2024: To get discharged from rehab and return back to my home.         Depression Screen     05/07/2024   10:53 AM 01/29/2024    8:47 AM 01/16/2024   10:19 AM 01/06/2024    4:25 PM 12/23/2023    2:38 PM 12/19/2023    3:14 PM 11/27/2023   11:33 AM  PHQ 2/9 Scores  PHQ - 2 Score 0 3 2 0 0 0 0  PHQ- 9 Score 3 13 5 3 1 1  0    Fall Risk     05/07/2024   10:51 AM 01/29/2024    8:48 AM 01/16/2024   10:52 AM 12/24/2023    9:03 AM 12/23/2023    2:38 PM  Fall Risk   Falls in the past year? 1 0 0 0 0  Number falls in past yr: 1  0 0 0  Injury with Fall? 1  0 0 0  Risk for fall due to : History of fall(s);Impaired balance/gait;Orthopedic patient      Follow up Falls evaluation completed;Education provided        MEDICARE RISK AT HOME:  Medicare Risk at Home Any stairs in or around the home?: No If so, are there any without handrails?: No Home free of loose throw rugs in walkways, pet beds, electrical cords, etc?: Yes Adequate lighting in your home to reduce risk of falls?: Yes Life alert?: No Use of a cane, walker or w/c?: Yes Grab bars in the bathroom?: Yes Shower chair or bench in shower?: Yes Elevated toilet seat or a handicapped toilet?: Yes  TIMED UP AND GO:  Was the test performed?  No  Cognitive Function: Impaired: Patient has current diagnosis of cognitive impairment.    05/07/2024   10:55 AM  MMSE - Mini Mental State Exam  Not completed:  Unable to complete        05/06/2023    1:56 PM  6CIT Screen  What Year? 0 points  What month? 0 points  What time? 0 points  Count back from 20 0 points  Months in reverse 0 points  Repeat phrase 0 points  Total Score 0 points    Immunizations Immunization History  Administered Date(s) Administered   Fluad Quad(high Dose 65+) 07/27/2019, 08/24/2020, 09/01/2021, 08/13/2022   Fluad Trivalent(High Dose 65+) 07/22/2023   Influenza,inj,Quad PF,6+ Mos 07/18/2017, 08/05/2018   Moderna Sars-Covid-2 Vaccination 12/02/2019, 12/28/2019, 08/12/2020   PNEUMOCOCCAL CONJUGATE-20 08/13/2022   Pfizer  Covid-19 Vaccine Bivalent Booster 20yrs & up 09/01/2021   Pfizer(Comirnaty)Fall Seasonal Vaccine 12 years and older 09/17/2023   Pneumococcal Conjugate-13 07/18/2017   Pneumococcal-Unspecified 07/13/2014   Tdap 10/14/2017   Zoster Recombinant(Shingrix) 05/29/2023, 08/29/2023    Screening Tests Health Maintenance  Topic Date Due   FOOT EXAM  06/15/2023   OPHTHALMOLOGY EXAM  10/04/2023   Diabetic kidney evaluation - Urine ACR  05/20/2024   COVID-19 Vaccine (6 - Moderna risk 2024-25 season) 10/02/2024 (Originally 03/16/2024)   INFLUENZA VACCINE  06/12/2024   HEMOGLOBIN A1C  08/01/2024   Diabetic kidney evaluation - eGFR measurement  04/25/2025   Medicare Annual Wellness (AWV)  05/07/2025   DTaP/Tdap/Td (2 - Td or Tdap) 10/15/2027   Pneumococcal Vaccine: 50+ Years  Completed   DEXA SCAN  Completed   Zoster Vaccines- Shingrix  Completed   Hepatitis B Vaccines  Aged Out   HPV VACCINES  Aged Out   Meningococcal B Vaccine  Aged Out    Health Maintenance  Health Maintenance Due  Topic Date Due   FOOT EXAM  06/15/2023   OPHTHALMOLOGY EXAM  10/04/2023   Diabetic kidney evaluation - Urine ACR  05/20/2024   Health Maintenance Items Addressed: Yes Patient is due for the following: Hospital Follow Up, Foot Exam, Urine ACR and Shingrix. NCIR was verified and chart updated.  Additional  Screening:  Vision Screening: Recommended annual ophthalmology exams for early detection of glaucoma and other disorders of the eye. Would you like a referral to an eye doctor? No    Dental Screening: Recommended annual dental exams for proper oral hygiene  Community Resource Referral / Chronic Care Management: CRR required this visit?  No   CCM required this visit?  No   Plan:    I have personally reviewed and noted the following in the patient's chart:   Medical and social history Use of alcohol, tobacco or illicit drugs  Current medications and supplements including opioid prescriptions. Patient is not currently taking opioid prescriptions. Functional ability and status Nutritional status Physical activity Advanced directives List of other physicians Hospitalizations, surgeries, and ER visits in previous 12 months Vitals Screenings to include cognitive, depression, and falls Referrals and appointments  In addition, I have reviewed and discussed with patient certain preventive protocols, quality metrics, and best practice recommendations. A written personalized care plan for preventive services as well as general preventive health recommendations were provided to patient.   Roz LOISE Fuller, LPN   [id number]   After Visit Summary: (Declined) Due to this being a telephonic visit, with patients personalized plan was offered to patient but patient Declined AVS at this time   Notes: Patient is due for the following: Hospital Follow Up, Foot Exam, Urine ACR and Shingrix.

## 2024-05-12 DIAGNOSIS — R5381 Other malaise: Secondary | ICD-10-CM | POA: Diagnosis not present

## 2024-05-12 DIAGNOSIS — I4811 Longstanding persistent atrial fibrillation: Secondary | ICD-10-CM | POA: Diagnosis not present

## 2024-05-12 DIAGNOSIS — Z7901 Long term (current) use of anticoagulants: Secondary | ICD-10-CM | POA: Diagnosis not present

## 2024-05-12 DIAGNOSIS — N184 Chronic kidney disease, stage 4 (severe): Secondary | ICD-10-CM | POA: Diagnosis not present

## 2024-05-12 DIAGNOSIS — I5022 Chronic systolic (congestive) heart failure: Secondary | ICD-10-CM | POA: Diagnosis not present

## 2024-05-12 DIAGNOSIS — L89154 Pressure ulcer of sacral region, stage 4: Secondary | ICD-10-CM | POA: Diagnosis not present

## 2024-05-12 DIAGNOSIS — Z794 Long term (current) use of insulin: Secondary | ICD-10-CM | POA: Diagnosis not present

## 2024-05-12 DIAGNOSIS — Z7985 Long-term (current) use of injectable non-insulin antidiabetic drugs: Secondary | ICD-10-CM | POA: Diagnosis not present

## 2024-05-12 DIAGNOSIS — E1122 Type 2 diabetes mellitus with diabetic chronic kidney disease: Secondary | ICD-10-CM | POA: Diagnosis not present

## 2024-05-16 DIAGNOSIS — Z6835 Body mass index (BMI) 35.0-35.9, adult: Secondary | ICD-10-CM | POA: Diagnosis not present

## 2024-05-16 DIAGNOSIS — F419 Anxiety disorder, unspecified: Secondary | ICD-10-CM | POA: Diagnosis not present

## 2024-05-16 DIAGNOSIS — I130 Hypertensive heart and chronic kidney disease with heart failure and stage 1 through stage 4 chronic kidney disease, or unspecified chronic kidney disease: Secondary | ICD-10-CM | POA: Diagnosis not present

## 2024-05-16 DIAGNOSIS — E1122 Type 2 diabetes mellitus with diabetic chronic kidney disease: Secondary | ICD-10-CM | POA: Diagnosis not present

## 2024-05-16 DIAGNOSIS — E785 Hyperlipidemia, unspecified: Secondary | ICD-10-CM | POA: Diagnosis not present

## 2024-05-16 DIAGNOSIS — E1165 Type 2 diabetes mellitus with hyperglycemia: Secondary | ICD-10-CM | POA: Diagnosis not present

## 2024-05-16 DIAGNOSIS — R634 Abnormal weight loss: Secondary | ICD-10-CM | POA: Diagnosis not present

## 2024-05-16 DIAGNOSIS — I5043 Acute on chronic combined systolic (congestive) and diastolic (congestive) heart failure: Secondary | ICD-10-CM | POA: Diagnosis not present

## 2024-05-16 DIAGNOSIS — I2510 Atherosclerotic heart disease of native coronary artery without angina pectoris: Secondary | ICD-10-CM | POA: Diagnosis not present

## 2024-05-16 DIAGNOSIS — L89154 Pressure ulcer of sacral region, stage 4: Secondary | ICD-10-CM | POA: Diagnosis not present

## 2024-05-16 DIAGNOSIS — E669 Obesity, unspecified: Secondary | ICD-10-CM | POA: Diagnosis not present

## 2024-05-16 DIAGNOSIS — N184 Chronic kidney disease, stage 4 (severe): Secondary | ICD-10-CM | POA: Diagnosis not present

## 2024-05-16 DIAGNOSIS — D631 Anemia in chronic kidney disease: Secondary | ICD-10-CM | POA: Diagnosis not present

## 2024-05-18 ENCOUNTER — Telehealth: Payer: Self-pay

## 2024-05-18 NOTE — Telephone Encounter (Signed)
 Patient's son calls nurse line requesting to schedule an appointment to receive evaluation for a sacral pressure injury. Patient was recently discharged from Rehab facility. Son reports that they were discharged, as insurance would not cover any additional time.   Patient is not back at her baseline strength and is still having difficulty with transfers and ambulation.   Son does report that patient is now established with home health PT.   Offered same day appointment to further evaluate wound. Son states that he will need to schedule for tomorrow so that he is able to find someone to assist him with getting patient into the office.   Scheduled for tomorrow afternoon with Dr. Romelle.   Chiquita JAYSON English, RN

## 2024-05-19 ENCOUNTER — Emergency Department (HOSPITAL_COMMUNITY)

## 2024-05-19 ENCOUNTER — Ambulatory Visit: Admitting: Family Medicine

## 2024-05-19 ENCOUNTER — Inpatient Hospital Stay (HOSPITAL_COMMUNITY)
Admission: EM | Admit: 2024-05-19 | Discharge: 2024-05-25 | DRG: 539 | Disposition: A | Discharge status: Skilled Nursing Facility | Attending: Family Medicine | Admitting: Family Medicine

## 2024-05-19 ENCOUNTER — Other Ambulatory Visit: Payer: Self-pay

## 2024-05-19 ENCOUNTER — Encounter (HOSPITAL_COMMUNITY): Payer: Self-pay

## 2024-05-19 DIAGNOSIS — E872 Acidosis, unspecified: Secondary | ICD-10-CM | POA: Diagnosis not present

## 2024-05-19 DIAGNOSIS — Z7985 Long-term (current) use of injectable non-insulin antidiabetic drugs: Secondary | ICD-10-CM

## 2024-05-19 DIAGNOSIS — W07XXXA Fall from chair, initial encounter: Secondary | ICD-10-CM | POA: Diagnosis present

## 2024-05-19 DIAGNOSIS — R1310 Dysphagia, unspecified: Secondary | ICD-10-CM | POA: Diagnosis present

## 2024-05-19 DIAGNOSIS — Z6832 Body mass index (BMI) 32.0-32.9, adult: Secondary | ICD-10-CM

## 2024-05-19 DIAGNOSIS — E1122 Type 2 diabetes mellitus with diabetic chronic kidney disease: Secondary | ICD-10-CM | POA: Diagnosis not present

## 2024-05-19 DIAGNOSIS — E66811 Obesity, class 1: Secondary | ICD-10-CM | POA: Diagnosis not present

## 2024-05-19 DIAGNOSIS — I69392 Facial weakness following cerebral infarction: Secondary | ICD-10-CM

## 2024-05-19 DIAGNOSIS — Z7901 Long term (current) use of anticoagulants: Secondary | ICD-10-CM

## 2024-05-19 DIAGNOSIS — E1169 Type 2 diabetes mellitus with other specified complication: Secondary | ICD-10-CM | POA: Diagnosis present

## 2024-05-19 DIAGNOSIS — I509 Heart failure, unspecified: Secondary | ICD-10-CM | POA: Diagnosis not present

## 2024-05-19 DIAGNOSIS — I5022 Chronic systolic (congestive) heart failure: Secondary | ICD-10-CM | POA: Diagnosis not present

## 2024-05-19 DIAGNOSIS — M47816 Spondylosis without myelopathy or radiculopathy, lumbar region: Secondary | ICD-10-CM | POA: Diagnosis not present

## 2024-05-19 DIAGNOSIS — Z832 Family history of diseases of the blood and blood-forming organs and certain disorders involving the immune mechanism: Secondary | ICD-10-CM

## 2024-05-19 DIAGNOSIS — E1142 Type 2 diabetes mellitus with diabetic polyneuropathy: Secondary | ICD-10-CM | POA: Diagnosis not present

## 2024-05-19 DIAGNOSIS — R41841 Cognitive communication deficit: Secondary | ICD-10-CM | POA: Diagnosis not present

## 2024-05-19 DIAGNOSIS — E785 Hyperlipidemia, unspecified: Secondary | ICD-10-CM | POA: Diagnosis not present

## 2024-05-19 DIAGNOSIS — N179 Acute kidney failure, unspecified: Secondary | ICD-10-CM | POA: Diagnosis not present

## 2024-05-19 DIAGNOSIS — Z801 Family history of malignant neoplasm of trachea, bronchus and lung: Secondary | ICD-10-CM

## 2024-05-19 DIAGNOSIS — N183 Chronic kidney disease, stage 3 unspecified: Secondary | ICD-10-CM

## 2024-05-19 DIAGNOSIS — F29 Unspecified psychosis not due to a substance or known physiological condition: Secondary | ICD-10-CM | POA: Diagnosis not present

## 2024-05-19 DIAGNOSIS — M7061 Trochanteric bursitis, right hip: Secondary | ICD-10-CM | POA: Diagnosis not present

## 2024-05-19 DIAGNOSIS — M4628 Osteomyelitis of vertebra, sacral and sacrococcygeal region: Secondary | ICD-10-CM | POA: Diagnosis not present

## 2024-05-19 DIAGNOSIS — N184 Chronic kidney disease, stage 4 (severe): Secondary | ICD-10-CM | POA: Diagnosis not present

## 2024-05-19 DIAGNOSIS — L89154 Pressure ulcer of sacral region, stage 4: Secondary | ICD-10-CM | POA: Diagnosis present

## 2024-05-19 DIAGNOSIS — R9082 White matter disease, unspecified: Secondary | ICD-10-CM | POA: Diagnosis not present

## 2024-05-19 DIAGNOSIS — Z7401 Bed confinement status: Secondary | ICD-10-CM | POA: Diagnosis not present

## 2024-05-19 DIAGNOSIS — S31000A Unspecified open wound of lower back and pelvis without penetration into retroperitoneum, initial encounter: Secondary | ICD-10-CM

## 2024-05-19 DIAGNOSIS — M6281 Muscle weakness (generalized): Secondary | ICD-10-CM | POA: Diagnosis not present

## 2024-05-19 DIAGNOSIS — S3210XA Unspecified fracture of sacrum, initial encounter for closed fracture: Principal | ICD-10-CM | POA: Diagnosis present

## 2024-05-19 DIAGNOSIS — E113591 Type 2 diabetes mellitus with proliferative diabetic retinopathy without macular edema, right eye: Secondary | ICD-10-CM | POA: Diagnosis present

## 2024-05-19 DIAGNOSIS — K8020 Calculus of gallbladder without cholecystitis without obstruction: Secondary | ICD-10-CM | POA: Diagnosis not present

## 2024-05-19 DIAGNOSIS — D649 Anemia, unspecified: Secondary | ICD-10-CM | POA: Diagnosis present

## 2024-05-19 DIAGNOSIS — M7060 Trochanteric bursitis, unspecified hip: Secondary | ICD-10-CM | POA: Diagnosis not present

## 2024-05-19 DIAGNOSIS — F0390 Unspecified dementia without behavioral disturbance: Secondary | ICD-10-CM | POA: Diagnosis not present

## 2024-05-19 DIAGNOSIS — I4811 Longstanding persistent atrial fibrillation: Secondary | ICD-10-CM | POA: Diagnosis present

## 2024-05-19 DIAGNOSIS — R710 Precipitous drop in hematocrit: Secondary | ICD-10-CM

## 2024-05-19 DIAGNOSIS — S3210XD Unspecified fracture of sacrum, subsequent encounter for fracture with routine healing: Secondary | ICD-10-CM | POA: Diagnosis not present

## 2024-05-19 DIAGNOSIS — N281 Cyst of kidney, acquired: Secondary | ICD-10-CM | POA: Diagnosis not present

## 2024-05-19 DIAGNOSIS — N1831 Chronic kidney disease, stage 3a: Secondary | ICD-10-CM | POA: Diagnosis not present

## 2024-05-19 DIAGNOSIS — I255 Ischemic cardiomyopathy: Secondary | ICD-10-CM | POA: Diagnosis not present

## 2024-05-19 DIAGNOSIS — Z955 Presence of coronary angioplasty implant and graft: Secondary | ICD-10-CM

## 2024-05-19 DIAGNOSIS — Z96651 Presence of right artificial knee joint: Secondary | ICD-10-CM | POA: Diagnosis present

## 2024-05-19 DIAGNOSIS — E119 Type 2 diabetes mellitus without complications: Secondary | ICD-10-CM

## 2024-05-19 DIAGNOSIS — I872 Venous insufficiency (chronic) (peripheral): Secondary | ICD-10-CM | POA: Diagnosis present

## 2024-05-19 DIAGNOSIS — M51369 Other intervertebral disc degeneration, lumbar region without mention of lumbar back pain or lower extremity pain: Secondary | ICD-10-CM | POA: Diagnosis not present

## 2024-05-19 DIAGNOSIS — Z888 Allergy status to other drugs, medicaments and biological substances status: Secondary | ICD-10-CM

## 2024-05-19 DIAGNOSIS — D631 Anemia in chronic kidney disease: Secondary | ICD-10-CM | POA: Diagnosis not present

## 2024-05-19 DIAGNOSIS — R1319 Other dysphagia: Secondary | ICD-10-CM | POA: Diagnosis not present

## 2024-05-19 DIAGNOSIS — K219 Gastro-esophageal reflux disease without esophagitis: Secondary | ICD-10-CM | POA: Diagnosis present

## 2024-05-19 DIAGNOSIS — I493 Ventricular premature depolarization: Secondary | ICD-10-CM | POA: Diagnosis not present

## 2024-05-19 DIAGNOSIS — M8580 Other specified disorders of bone density and structure, unspecified site: Secondary | ICD-10-CM | POA: Diagnosis not present

## 2024-05-19 DIAGNOSIS — Z043 Encounter for examination and observation following other accident: Secondary | ICD-10-CM | POA: Diagnosis not present

## 2024-05-19 DIAGNOSIS — S0990XA Unspecified injury of head, initial encounter: Secondary | ICD-10-CM | POA: Diagnosis not present

## 2024-05-19 DIAGNOSIS — R54 Age-related physical debility: Secondary | ICD-10-CM | POA: Diagnosis present

## 2024-05-19 DIAGNOSIS — Z7409 Other reduced mobility: Secondary | ICD-10-CM | POA: Diagnosis not present

## 2024-05-19 DIAGNOSIS — G894 Chronic pain syndrome: Secondary | ICD-10-CM | POA: Diagnosis present

## 2024-05-19 DIAGNOSIS — R4182 Altered mental status, unspecified: Secondary | ICD-10-CM | POA: Diagnosis not present

## 2024-05-19 DIAGNOSIS — S76311A Strain of muscle, fascia and tendon of the posterior muscle group at thigh level, right thigh, initial encounter: Secondary | ICD-10-CM | POA: Diagnosis not present

## 2024-05-19 DIAGNOSIS — L8990 Pressure ulcer of unspecified site, unspecified stage: Secondary | ICD-10-CM | POA: Diagnosis not present

## 2024-05-19 DIAGNOSIS — S338XXA Sprain of other parts of lumbar spine and pelvis, initial encounter: Secondary | ICD-10-CM | POA: Diagnosis not present

## 2024-05-19 DIAGNOSIS — Z993 Dependence on wheelchair: Secondary | ICD-10-CM

## 2024-05-19 DIAGNOSIS — Y92009 Unspecified place in unspecified non-institutional (private) residence as the place of occurrence of the external cause: Secondary | ICD-10-CM

## 2024-05-19 DIAGNOSIS — G4733 Obstructive sleep apnea (adult) (pediatric): Secondary | ICD-10-CM | POA: Diagnosis present

## 2024-05-19 DIAGNOSIS — Z741 Need for assistance with personal care: Secondary | ICD-10-CM | POA: Diagnosis not present

## 2024-05-19 DIAGNOSIS — I2510 Atherosclerotic heart disease of native coronary artery without angina pectoris: Secondary | ICD-10-CM | POA: Diagnosis present

## 2024-05-19 DIAGNOSIS — Z5320 Procedure and treatment not carried out because of patient's decision for unspecified reasons: Secondary | ICD-10-CM | POA: Diagnosis present

## 2024-05-19 DIAGNOSIS — L304 Erythema intertrigo: Secondary | ICD-10-CM | POA: Diagnosis present

## 2024-05-19 DIAGNOSIS — I130 Hypertensive heart and chronic kidney disease with heart failure and stage 1 through stage 4 chronic kidney disease, or unspecified chronic kidney disease: Secondary | ICD-10-CM | POA: Diagnosis present

## 2024-05-19 DIAGNOSIS — Z91048 Other nonmedicinal substance allergy status: Secondary | ICD-10-CM

## 2024-05-19 DIAGNOSIS — L89159 Pressure ulcer of sacral region, unspecified stage: Secondary | ICD-10-CM | POA: Diagnosis not present

## 2024-05-19 DIAGNOSIS — R296 Repeated falls: Secondary | ICD-10-CM | POA: Diagnosis present

## 2024-05-19 DIAGNOSIS — M4646 Discitis, unspecified, lumbar region: Secondary | ICD-10-CM | POA: Diagnosis present

## 2024-05-19 DIAGNOSIS — Z751 Person awaiting admission to adequate facility elsewhere: Secondary | ICD-10-CM

## 2024-05-19 DIAGNOSIS — Z882 Allergy status to sulfonamides status: Secondary | ICD-10-CM

## 2024-05-19 DIAGNOSIS — M4156 Other secondary scoliosis, lumbar region: Secondary | ICD-10-CM | POA: Diagnosis not present

## 2024-05-19 DIAGNOSIS — M48061 Spinal stenosis, lumbar region without neurogenic claudication: Secondary | ICD-10-CM | POA: Diagnosis not present

## 2024-05-19 DIAGNOSIS — F40240 Claustrophobia: Secondary | ICD-10-CM | POA: Diagnosis present

## 2024-05-19 DIAGNOSIS — M4856XD Collapsed vertebra, not elsewhere classified, lumbar region, subsequent encounter for fracture with routine healing: Secondary | ICD-10-CM | POA: Diagnosis not present

## 2024-05-19 DIAGNOSIS — R000 Tachycardia, unspecified: Secondary | ICD-10-CM | POA: Diagnosis not present

## 2024-05-19 DIAGNOSIS — M5126 Other intervertebral disc displacement, lumbar region: Secondary | ICD-10-CM | POA: Diagnosis not present

## 2024-05-19 DIAGNOSIS — Z825 Family history of asthma and other chronic lower respiratory diseases: Secondary | ICD-10-CM

## 2024-05-19 DIAGNOSIS — L98424 Non-pressure chronic ulcer of back with necrosis of bone: Secondary | ICD-10-CM | POA: Diagnosis not present

## 2024-05-19 DIAGNOSIS — L98429 Non-pressure chronic ulcer of back with unspecified severity: Principal | ICD-10-CM | POA: Diagnosis present

## 2024-05-19 DIAGNOSIS — H3530 Unspecified macular degeneration: Secondary | ICD-10-CM | POA: Diagnosis present

## 2024-05-19 DIAGNOSIS — I959 Hypotension, unspecified: Secondary | ICD-10-CM | POA: Diagnosis present

## 2024-05-19 DIAGNOSIS — Z79899 Other long term (current) drug therapy: Secondary | ICD-10-CM

## 2024-05-19 DIAGNOSIS — W19XXXA Unspecified fall, initial encounter: Principal | ICD-10-CM | POA: Insufficient documentation

## 2024-05-19 DIAGNOSIS — L089 Local infection of the skin and subcutaneous tissue, unspecified: Secondary | ICD-10-CM

## 2024-05-19 DIAGNOSIS — Z951 Presence of aortocoronary bypass graft: Secondary | ICD-10-CM

## 2024-05-19 DIAGNOSIS — Z823 Family history of stroke: Secondary | ICD-10-CM

## 2024-05-19 DIAGNOSIS — M4626 Osteomyelitis of vertebra, lumbar region: Secondary | ICD-10-CM | POA: Diagnosis not present

## 2024-05-19 LAB — C-REACTIVE PROTEIN: CRP: 16.4 mg/dL — ABNORMAL HIGH (ref <1.0)

## 2024-05-19 LAB — SEDIMENTATION RATE: Sed Rate: 54 mm/h — ABNORMAL HIGH (ref 0–22)

## 2024-05-19 LAB — CBC WITH DIFFERENTIAL/PLATELET
Abs Immature Granulocytes: 0.08 K/uL — ABNORMAL HIGH (ref 0.00–0.07)
Basophils Absolute: 0.1 K/uL (ref 0.0–0.1)
Basophils Relative: 0 %
Eosinophils Absolute: 0 K/uL (ref 0.0–0.5)
Eosinophils Relative: 0 %
HCT: 30.7 % — ABNORMAL LOW (ref 36.0–46.0)
Hemoglobin: 9.5 g/dL — ABNORMAL LOW (ref 12.0–15.0)
Immature Granulocytes: 1 %
Lymphocytes Relative: 7 %
Lymphs Abs: 0.8 K/uL (ref 0.7–4.0)
MCH: 28.4 pg (ref 26.0–34.0)
MCHC: 30.9 g/dL (ref 30.0–36.0)
MCV: 91.6 fL (ref 80.0–100.0)
Monocytes Absolute: 0.9 K/uL (ref 0.1–1.0)
Monocytes Relative: 8 %
Neutro Abs: 9.9 K/uL — ABNORMAL HIGH (ref 1.7–7.7)
Neutrophils Relative %: 84 %
Platelets: 256 K/uL (ref 150–400)
RBC: 3.35 MIL/uL — ABNORMAL LOW (ref 3.87–5.11)
RDW: 21.9 % — ABNORMAL HIGH (ref 11.5–15.5)
Smear Review: NORMAL
WBC: 11.8 K/uL — ABNORMAL HIGH (ref 4.0–10.5)
nRBC: 0 % (ref 0.0–0.2)

## 2024-05-19 LAB — I-STAT CG4 LACTIC ACID, ED
Lactic Acid, Venous: 2.5 mmol/L (ref 0.5–1.9)
Lactic Acid, Venous: 1.3 mmol/L (ref 0.5–1.9)

## 2024-05-19 LAB — BASIC METABOLIC PANEL WITH GFR
Anion gap: 14 (ref 5–15)
BUN: 61 mg/dL — ABNORMAL HIGH (ref 8–23)
CO2: 20 mmol/L — ABNORMAL LOW (ref 22–32)
Calcium: 9.9 mg/dL (ref 8.9–10.3)
Chloride: 102 mmol/L (ref 98–111)
Creatinine, Ser: 2.35 mg/dL — ABNORMAL HIGH (ref 0.44–1.00)
GFR, Estimated: 20 mL/min — ABNORMAL LOW (ref >60)
Glucose, Bld: 175 mg/dL — ABNORMAL HIGH (ref 70–99)
Potassium: 3.8 mmol/L (ref 3.5–5.1)
Sodium: 136 mmol/L (ref 135–145)

## 2024-05-19 LAB — GLUCOSE, CAPILLARY: Glucose-Capillary: 117 mg/dL — ABNORMAL HIGH (ref 70–99)

## 2024-05-19 LAB — CK: Total CK: 177 U/L (ref 38–234)

## 2024-05-19 MED ORDER — APIXABAN 2.5 MG PO TABS
2.5000 mg | ORAL_TABLET | Freq: Two times a day (BID) | ORAL | Status: DC
  Administered 2024-05-19 – 2024-05-25 (×12): 2.5 mg via ORAL
  Filled 2024-05-19 (×12): qty 1

## 2024-05-19 MED ORDER — TRAMADOL HCL 50 MG PO TABS
50.0000 mg | ORAL_TABLET | Freq: Three times a day (TID) | ORAL | Status: DC
  Administered 2024-05-19 – 2024-05-22 (×8): 50 mg via ORAL
  Filled 2024-05-19 (×7): qty 1

## 2024-05-19 MED ORDER — SODIUM CHLORIDE 0.9 % IV SOLN
2.0000 g | Freq: Once | INTRAVENOUS | Status: AC
  Administered 2024-05-19: 2 g via INTRAVENOUS
  Filled 2024-05-19: qty 12.5

## 2024-05-19 MED ORDER — FERROUS SULFATE 325 (65 FE) MG PO TABS
325.0000 mg | ORAL_TABLET | Freq: Every day | ORAL | Status: DC
  Administered 2024-05-20 – 2024-05-25 (×6): 325 mg via ORAL
  Filled 2024-05-19 (×6): qty 1

## 2024-05-19 MED ORDER — ROPINIROLE HCL 1 MG PO TABS
1.0000 mg | ORAL_TABLET | Freq: Every day | ORAL | Status: DC
  Administered 2024-05-19 – 2024-05-24 (×6): 1 mg via ORAL
  Filled 2024-05-19 (×6): qty 1

## 2024-05-19 MED ORDER — SENNA 8.6 MG PO TABS
1.0000 | ORAL_TABLET | Freq: Two times a day (BID) | ORAL | Status: DC
  Administered 2024-05-19 – 2024-05-25 (×12): 8.6 mg via ORAL
  Filled 2024-05-19 (×12): qty 1

## 2024-05-19 MED ORDER — BRIMONIDINE TARTRATE 0.2 % OP SOLN
1.0000 [drp] | Freq: Two times a day (BID) | OPHTHALMIC | Status: DC
  Administered 2024-05-19 – 2024-05-25 (×11): 1 [drp] via OPHTHALMIC
  Filled 2024-05-19: qty 5

## 2024-05-19 MED ORDER — METRONIDAZOLE 500 MG/100ML IV SOLN
500.0000 mg | Freq: Once | INTRAVENOUS | Status: AC
  Administered 2024-05-19: 500 mg via INTRAVENOUS
  Filled 2024-05-19: qty 100

## 2024-05-19 MED ORDER — TORSEMIDE 20 MG PO TABS
40.0000 mg | ORAL_TABLET | Freq: Every day | ORAL | Status: DC
  Filled 2024-05-19: qty 2

## 2024-05-19 MED ORDER — HYDROMORPHONE HCL 1 MG/ML IJ SOLN
1.0000 mg | INTRAMUSCULAR | Status: DC | PRN
  Administered 2024-05-19 – 2024-05-24 (×6): 1 mg via INTRAVENOUS
  Filled 2024-05-19 (×6): qty 1

## 2024-05-19 MED ORDER — PROSIGHT PO TABS
1.0000 | ORAL_TABLET | Freq: Every day | ORAL | Status: DC
  Administered 2024-05-20 – 2024-05-25 (×6): 1 via ORAL
  Filled 2024-05-19 (×6): qty 1

## 2024-05-19 MED ORDER — AQUAPHOR EX OINT: TOPICAL_OINTMENT | Freq: Every day | CUTANEOUS | Status: DC | PRN

## 2024-05-19 MED ORDER — VANCOMYCIN HCL IN DEXTROSE 1-5 GM/200ML-% IV SOLN: 1000.0000 mg | INTRAVENOUS | Status: DC

## 2024-05-19 MED ORDER — METOPROLOL SUCCINATE ER 25 MG PO TB24
12.5000 mg | ORAL_TABLET | Freq: Every day | ORAL | Status: DC
  Filled 2024-05-19: qty 1

## 2024-05-19 MED ORDER — INSULIN ASPART 100 UNIT/ML IJ SOLN
0.0000 [IU] | Freq: Three times a day (TID) | INTRAMUSCULAR | Status: DC
  Administered 2024-05-21: 3 [IU] via SUBCUTANEOUS
  Administered 2024-05-22: 2 [IU] via SUBCUTANEOUS
  Administered 2024-05-22 – 2024-05-24 (×3): 1 [IU] via SUBCUTANEOUS
  Administered 2024-05-24 – 2024-05-25 (×2): 3 [IU] via SUBCUTANEOUS
  Administered 2024-05-25: 2 [IU] via SUBCUTANEOUS

## 2024-05-19 MED ORDER — ACETAMINOPHEN 500 MG PO TABS
1000.0000 mg | ORAL_TABLET | Freq: Four times a day (QID) | ORAL | Status: DC
  Administered 2024-05-19 – 2024-05-25 (×22): 1000 mg via ORAL
  Filled 2024-05-19 (×23): qty 2

## 2024-05-19 MED ORDER — VITAMIN D 25 MCG (1000 UNIT) PO TABS
1000.0000 [IU] | ORAL_TABLET | Freq: Every day | ORAL | Status: DC
  Administered 2024-05-20 – 2024-05-25 (×6): 1000 [IU] via ORAL
  Filled 2024-05-19 (×6): qty 1

## 2024-05-19 MED ORDER — VITAMIN B-12 1000 MCG PO TABS
1000.0000 ug | ORAL_TABLET | Freq: Every day | ORAL | Status: DC
  Administered 2024-05-20 – 2024-05-25 (×6): 1000 ug via ORAL
  Filled 2024-05-19 (×6): qty 1

## 2024-05-19 MED ORDER — SODIUM CHLORIDE 0.9 % IV BOLUS
500.0000 mL | Freq: Once | INTRAVENOUS | Status: AC
  Administered 2024-05-19: 500 mL via INTRAVENOUS

## 2024-05-19 MED ORDER — VANCOMYCIN HCL 2000 MG/400ML IV SOLN
2000.0000 mg | Freq: Once | INTRAVENOUS | Status: AC
  Administered 2024-05-19: 2000 mg via INTRAVENOUS
  Filled 2024-05-19: qty 400

## 2024-05-19 MED ORDER — ATORVASTATIN CALCIUM 40 MG PO TABS
40.0000 mg | ORAL_TABLET | Freq: Every day | ORAL | Status: DC
  Administered 2024-05-20 – 2024-05-25 (×6): 40 mg via ORAL
  Filled 2024-05-19 (×6): qty 1

## 2024-05-19 MED ORDER — AMIODARONE HCL 200 MG PO TABS
200.0000 mg | ORAL_TABLET | Freq: Every day | ORAL | Status: DC
  Administered 2024-05-20 – 2024-05-25 (×6): 200 mg via ORAL
  Filled 2024-05-19 (×6): qty 1

## 2024-05-19 MED ORDER — SODIUM CHLORIDE 0.9 % IV SOLN: 2.0000 g | INTRAVENOUS | Status: DC

## 2024-05-19 MED ORDER — POLYETHYLENE GLYCOL 3350 17 G PO PACK
17.0000 g | PACK | Freq: Two times a day (BID) | ORAL | Status: DC
  Administered 2024-05-19 – 2024-05-25 (×12): 17 g via ORAL
  Filled 2024-05-19 (×12): qty 1

## 2024-05-19 MED ORDER — LIDOCAINE 5 % EX PTCH
1.0000 | MEDICATED_PATCH | CUTANEOUS | Status: DC
  Administered 2024-05-19 – 2024-05-24 (×6): 1 via TRANSDERMAL
  Filled 2024-05-19 (×6): qty 1

## 2024-05-19 MED ORDER — TRAMADOL HCL 50 MG PO TABS
50.0000 mg | ORAL_TABLET | Freq: Four times a day (QID) | ORAL | Status: DC
  Filled 2024-05-19: qty 1

## 2024-05-19 MED ORDER — TRIAMCINOLONE ACETONIDE 0.1 % EX OINT
TOPICAL_OINTMENT | Freq: Two times a day (BID) | CUTANEOUS | Status: DC
  Filled 2024-05-19: qty 15

## 2024-05-19 MED ORDER — METRONIDAZOLE 500 MG/100ML IV SOLN
500.0000 mg | Freq: Two times a day (BID) | INTRAVENOUS | Status: DC
  Administered 2024-05-20: 500 mg via INTRAVENOUS
  Filled 2024-05-19: qty 100

## 2024-05-19 NOTE — ED Provider Notes (Signed)
 [place] EMERGENCY DEPARTMENT AT [facility] Provider Note   CSN: [id number] Arrival date & time: 05/19/24  1021     Patient presents with: [name]   [name] is a 83 y.o. female.  {Add pertinent medical, surgical, social history, OB history to HPI:5737} 83 year old female with history of CKD, CHF, stroke on Eliquis , who is bedbound who presents to the emergency department after a fall and with her worsening sacral wound.  History obtained per the son and the patient.  He has been bedbound for months and unfortunately developed a sacral wound.  Over the past week the wound has gotten more red and has grown in size.  Has had some bloody discharge as well.  Patient has been having some subjective fevers.  Son was out today and found her on the ground on her bottom.        Prior to Admission medications   Medication Sig Start Date End Date Taking? Authorizing Provider  acetaminophen  (TYLENOL  8 HOUR) 650 MG CR tablet Take 1 tablet (650 mg total) by mouth every 8 (eight) hours as needed for pain. 11/03/19   [name] Beverley NOVAK, MD  amiodarone  (PACERONE ) 200 MG tablet Take 1 tablet (200 mg total) by mouth daily. 03/25/24   [place], [name] HERO, FNP  apixaban  (ELIQUIS ) 2.5 MG TABS tablet Take 1 tablet (2.5 mg total) by mouth 2 (two) times daily. 12/06/23   [name] Lung, MD  atorvastatin  (LIPITOR) 40 MG tablet Take 1 tablet (40 mg total) by mouth daily. 01/21/24   Rolan Ezra RAMAN, MD  Blood Glucose Monitoring Suppl (ONE TOUCH ULTRA 2) w/Device KIT USE TO CHECK BLOOD SUGAR UPTO 3 TIMES A DAY 12/06/23   [name] Lung, MD  brimonidine  (ALPHAGAN ) 0.2 % ophthalmic solution Place 1 drop into both eyes 2 (two) times daily. 01/15/24   [provider]  Cholecalciferol  25 MCG (1000 UT) tablet Take 1,000 Units by mouth daily.    [provider]  clotrimazole  (LOTRIMIN  AF) 1 % cream Apply 1 Application topically 2 (two) times daily. 01/06/24   Theophilus Pagan, MD  diclofenac  Sodium  (VOLTAREN ) 1 % GEL Apply 2 g topically as needed. 12/06/23   [name] Lung, MD  docusate sodium  (COLACE) 100 MG capsule Take 200 mg by mouth at bedtime.    [provider]  Dulaglutide  (TRULICITY ) 1.5 MG/0.5ML SOAJ Inject 1.5 mg into the skin once a week. 01/29/24   [name] Lung, MD  ferrous sulfate  325 (65 FE) MG tablet TAKE 1 TABLET BY MOUTH EVERY DAY WITH BREAKFAST 09/16/20   [name] Ellen, MD  glucose blood (ONE TOUCH ULTRA TEST) test strip Use three times a day 09/15/20   [name] Ellen, MD  glucose blood test strip Use as instructed to check blood glucose up to 4x daily 10/10/22   [name] Ellen, MD  Lancets [facility] DELICA PLUS Index) MISC Please use to check blood sugar up to four times daily. 10/10/22   [name] Ellen, MD  lidocaine  (LIDODERM ) 5 % Place 1 patch onto the skin daily. Remove & Discard patch within 12 hours or as directed by MD Patient taking differently: Place 1 patch onto the skin daily as needed. Remove & Discard patch within 12 hours or as directed by MD 12/23/23   [name] Lung, MD  megestrol  (MEGACE ) 40 MG tablet Take 1 tablet (40 mg total) by mouth daily. 01/29/24   [name] Lung, MD  metolazone  (ZAROXOLYN ) 2.5 MG tablet Take 1 tablet (2.5 mg total) by mouth daily. Patient  not taking: Reported on 03/25/2024 02/26/24 05/26/24  [name] HERO, PA-C  metoprolol  succinate (TOPROL -XL) 25 MG 24 hr tablet Take 12.5 mg by mouth daily.    [provider]  Multiple Vitamins-Minerals (PRESERVISION AREDS 2+MULTI VIT PO) Take 1 capsule by mouth in the morning and at bedtime.    [provider]  nitroGLYCERIN  (NITROSTAT ) 0.4 MG SL tablet Place 1 tablet (0.4 mg total) under the tongue every 5 (five) minutes as needed for chest pain (up to 3 doses). 12/19/23   [name] Lunger, DO  nystatin  (MYCOSTATIN /NYSTOP ) powder Apply 1 Application topically 3 (three) times daily. Until resolution. 01/29/24   [name] Lung, MD  polyethylene glycol (MIRALAX  / GLYCOLAX ) 17 g  packet Take 17 g by mouth 2 (two) times daily. 02/07/24   [name] Rush, MD  potassium chloride  SA (KLOR-CON  M) 20 MEQ tablet Take 1 tablet (20 mEq total) by mouth daily. 02/26/24   [name] HERO, PA-C  rOPINIRole  (REQUIP ) 0.5 MG tablet Take 2 tablets (1 mg total) by mouth at bedtime. 12/06/23   [name] Lung, MD  senna (SENOKOT) 8.6 MG TABS tablet Take 1 tablet (8.6 mg total) by mouth 2 (two) times daily. 02/07/24   [name] Rush, MD  torsemide  (DEMADEX ) 20 MG tablet Take 40 mg by mouth daily. Take 100 mg q am and 80 mg qpm    [provider]  traMADol  (ULTRAM ) 50 MG tablet Take 1 tablet (50 mg total) by mouth every 12 (twelve) hours as needed for severe pain (pain score 7-10) (Can take every 6 hours for breakthrough pain.). 01/13/24   [name] Lung, MD  vitamin B-12 (CYANOCOBALAMIN ) 1000 MCG tablet Take 1 tablet (1,000 mcg total) by mouth daily. 09/16/20   [name] Ellen, MD  zinc  oxide 20 % ointment Apply 1 application topically as needed (buttocks irritation). 10/12/21   [name] Ellen, MD    Allergies: Benazepril, Cymbalta  [duloxetine  hcl], Ozempic  (0.25 or 0.5 mg-dose) [semaglutide (0.25 or 0.5mg -dos)], Tape, Angiotensin receptor blockers, Bactrim [sulfamethoxazole-trimethoprim], and Fe-succ-c-thre-b12-des stomach    Review of Systems  Updated Vital Signs BP (!) 105/59 (BP Location: Left Arm)   Pulse 86   Temp 98.4 F (36.9 C)   Resp 19   Ht 5' 7 (1.702 m)   LMP  (LMP Unknown)   SpO2 100%   BMI 37.98 kg/m   Physical Exam Vitals and nursing note reviewed.  Constitutional:      General: She is not in acute distress.    Appearance: She is well-developed.     Comments: Chronically ill-appearing  HENT:     Head: Normocephalic and atraumatic.     Right Ear: External ear normal.     Left Ear: External ear normal.     Nose: Nose normal.  Eyes:     Extraocular Movements: Extraocular movements intact.     Conjunctiva/sclera: Conjunctivae normal.     Pupils: Pupils are  equal, round, and reactive to light.  Neck:     Comments: No C-spine midline tenderness to palpation Pulmonary:     Effort: Pulmonary effort is normal. No respiratory distress.  Musculoskeletal:     Cervical back: Normal range of motion and neck supple.     Right lower leg: No edema.     Left lower leg: No edema.  Skin:    General: Skin is warm and dry.     Comments: Stage IV sacral ulcer that is approximately 7 seated meters in diameter.  Erythema surrounding it.  Drainage also noted.  Neurological:  Mental Status: She is alert and oriented to person, place, and time. Mental status is at baseline.  Psychiatric:        Mood and Affect: Mood normal.     (all labs ordered are listed, but only abnormal results are displayed) Labs Reviewed  CBC WITH DIFFERENTIAL/PLATELET - Abnormal; Notable for the following components:      Result Value   WBC 11.8 (*)    RBC 3.35 (*)    Hemoglobin 9.5 (*)    HCT 30.7 (*)    RDW 21.9 (*)    Neutro Abs 9.9 (*)    Abs Immature Granulocytes 0.08 (*)    All other components within normal limits  BASIC METABOLIC PANEL WITH GFR - Abnormal; Notable for the following components:   CO2 20 (*)    Glucose, Bld 175 (*)    BUN 61 (*)    Creatinine, Ser 2.35 (*)    GFR, Estimated 20 (*)    All other components within normal limits  CK    EKG: None  Radiology: DG HIP UNILAT WITH PELVIS 2-3 VIEWS LEFT Result Date: 05/19/2024 CLINICAL DATA:  190176 Fall 190176 EXAM: DG HIP (WITH OR WITHOUT PELVIS) 2-3V LEFT; DG HIP (WITH OR WITHOUT PELVIS) 2-3V RIGHT COMPARISON:  None Available. FINDINGS: Right hip Diffuse osteopenia. The sacrum is partially obscured by fecal material in the rectum. No acute fracture or dislocation. There is no evidence of arthropathy or other focal bone abnormality. Diffuse aortoiliac and peripheral vascular atherosclerosis. Left hip Diffuse osteopenia.No acute fracture or dislocation. There is no evidence of arthropathy or other focal  bone abnormality. Diffuse, peripheral vascular atherosclerosis. Multilevel degenerative disc disease of the lumbar spine. IMPRESSION: The sacrum is partially obscured by fecal material in the rectum. Otherwise, no acute, displaced fracture or dislocation present in either hip. As underlying osteopenia decreases the sensitivity for nondisplaced fracture using plain radiography, if the patient is unable to bear weight, or there is high clinical suspicion, a follow-up CT should be considered for further evaluation. Electronically Signed   By: Rogelia Myers M.D.   On: 05/19/2024 12:50   DG HIP UNILAT WITH PELVIS 2-3 VIEWS RIGHT Result Date: 05/19/2024 CLINICAL DATA:  190176 Fall 190176 EXAM: DG HIP (WITH OR WITHOUT PELVIS) 2-3V LEFT; DG HIP (WITH OR WITHOUT PELVIS) 2-3V RIGHT COMPARISON:  None Available. FINDINGS: Right hip Diffuse osteopenia. The sacrum is partially obscured by fecal material in the rectum. No acute fracture or dislocation. There is no evidence of arthropathy or other focal bone abnormality. Diffuse aortoiliac and peripheral vascular atherosclerosis. Left hip Diffuse osteopenia.No acute fracture or dislocation. There is no evidence of arthropathy or other focal bone abnormality. Diffuse, peripheral vascular atherosclerosis. Multilevel degenerative disc disease of the lumbar spine. IMPRESSION: The sacrum is partially obscured by fecal material in the rectum. Otherwise, no acute, displaced fracture or dislocation present in either hip. As underlying osteopenia decreases the sensitivity for nondisplaced fracture using plain radiography, if the patient is unable to bear weight, or there is high clinical suspicion, a follow-up CT should be considered for further evaluation. Electronically Signed   By: Rogelia Myers M.D.   On: 05/19/2024 12:50   CT Head Wo Contrast Result Date: 05/19/2024 EXAM: CT HEAD WITHOUT 05/19/2024 11:48:03 AM TECHNIQUE: CT of the head was performed without the administration  of intravenous contrast. Automated exposure control, iterative reconstruction, and/or weight based adjustment of the mA/kV was utilized to reduce the radiation dose to as low as reasonably achievable. COMPARISON: None  available. CLINICAL HISTORY: Head trauma, moderate-severe. Pt complains of mechanical fall out of her chair. Was on the ground for approximately 2 hours. She denies hitting her head or losing consciousness. She is anticoagulated. FINDINGS: BRAIN AND VENTRICLES: No acute intracranial hemorrhage. No mass effect or midline shift. No extra-axial fluid collection. Gray-white differentiation is maintained. No hydrocephalus. Remote infarcts involving the posterior right insular cortex and right lentiform nucleus are stable. Advanced atrophy and white matter disease are stable. Remote infarcts of the right caudate head are noted. No acute infarct is present. ORBITS: No acute abnormality. SINUSES AND MASTOIDS: No acute abnormality. SOFT TISSUES AND SKULL: No acute skull fracture. No acute soft tissue abnormality. VASCULATURE: Bilateral cavernous ICA stents are in place. IMPRESSION: 1. No acute intracranial abnormality related to the reported head trauma. 2. Stable remote infarcts involving the posterior right insular cortex, right lentiform nucleus, and right caudate head. 3. Chronic advanced atrophy and white matter disease is stable. Electronically signed by: Lonni Necessary MD 05/19/2024 12:37 PM EDT RP Workstation: [id number]    {Document cardiac monitor, telemetry assessment procedure when appropriate:32947} Procedures   Medications Ordered in the ED - No data to display  Clinical Course as of 05/19/24 1952  Tue May 19, 2024  1509 Creatinine(!): 2.35 Baseline of 1.9 [RP]  1510 X-rays of the hips without obvious evidence of fracture. [RP]  1511 CT head without acute findings. [RP]  1929 Discussed with Dr. Lennie from family medicine who will come evaluate for admission. [RP]    Clinical  Course User Index [RP] Yolande Lamar BROCKS, MD   {Click here for ABCD2, HEART and other calculators REFRESH Note before signing:1}                              Medical Decision Making Amount and/or Complexity of Data Reviewed Labs: ordered. Decision-making details documented in ED Course. Radiology: ordered.  Risk Prescription drug management. Decision regarding hospitalization.   83 year old female with history of CKD, CHF, stroke on Eliquis , who is bedbound who presents to the emergency department after a fall and with her worsening sacral wound.    Initial Ddx:  ***   MDM/Course:  *** Upon re-evaluation ***  This patient presents to the ED for concern of complaints listed in HPI, this involves an extensive number of treatment options, and is a complaint that carries with it a high risk of complications and morbidity. Disposition including potential need for admission considered.   Dispo: {Disposition:28069}  Additional history obtained from {Additional History:28067} Records reviewed {Records Reviewed:28068} The following labs were independently interpreted: {labs interpreted:28064} and show {lab findings:28250} I independently reviewed the following imaging with scope of interpretation limited to determining acute life threatening conditions related to emergency care: {imaging interpreted:28065} and agree with the radiologist interpretation with the following exceptions: none I personally reviewed and interpreted cardiac monitoring: {cardiac monitoring:28251} I personally reviewed and interpreted the pt's EKG: see above for interpretation  I have reviewed the patients home medications and made adjustments as needed Consults: {Consultants:28063} Social Determinants of health:  ***  Portions of this note were generated with Scientist, clinical (histocompatibility and immunogenetics). Dictation errors may occur despite best attempts at proofreading.     Final diagnoses:  None    ED Discharge Orders      None

## 2024-05-19 NOTE — Progress Notes (Signed)
 ED Pharmacy Antibiotic Sign Off An antibiotic consult was received from an ED provider for cefepime  and vancomycin  per pharmacy dosing for cellulitis. A chart review was completed to assess appropriateness.    The following one time order(s) were placed per pharmacy consult:  cefepime  2000 mg x 1 dose vancomycin  2000 mg x 1 dose   Further antibiotic and/or antibiotic pharmacy consults should be ordered by the admitting provider if indicated.    Thank you for allowing pharmacy to be a part of this patient's care.    Belvie Macintosh, PharmD Candidate 5:35 PM 05/19/24

## 2024-05-19 NOTE — Assessment & Plan Note (Addendum)
 Patient has had coordination and balance issues with increasing age and her history of stroke.  Recent hospitalization and rehab stay further compounding picture with profound deconditioning.  Patient fell at home and endorses pain in the left hip that radiates upward.  No bruising of the hip noted on exam.  No acute fractures other than sacral fracture as above noted on x-ray. - Pain control as above - Lidocaine  patch over left hip - PT and OT as above

## 2024-05-19 NOTE — Hospital Course (Addendum)
 [name] is a 83 y.o. female who was admitted to the [facility] Medicine Teaching Service at [facility] for infected sacral ulcer. Hospital course is outlined below by problem.   Sacral osteomyelitis 2/2 stage IV sacral decubitus pressure ulcer Presents from SNF with stage IV sacral ulcer with surrounding erythema and drainage. MRI Pelvis showed active osteomyelitis of sacral region; also suggesting possible osteo of vertebrae and so MRI lumbar spine done which showed prior osteomyelitis discitis at L2-3 with no active/recurrent disease. General surgery was consulted, did not recommend debridement.  Initiated on empiric antibiotics, ID was consulted and recommended narrowing to Unasyn  which was transitioned to PO Augmentin  x 2-week course.  Antibiotic course: - IV Cefepime  7/8, IV Vancomycin  7/8, IV Flagyl  7/8-7/9 - IV Unasyn  7/9-7/13 - PO Augmentin  500-125 mg twice daily (7/13-7/29)  Wound care: Saline WTD dressings Q12 hours, cover with dry gauze.  Fall Due to deconditioning given multiple comorbidities and difficult ambulation at baseline. CT head reassuringly negative.  CTAP with sacral fracture; otherwise pelvic radiographs without abnormality.  Normocytic Anemia Initial hemoglobin 9.5, but down trended to 7.2 and patient received 1 unit of pRBCs given cardiac history with improvement. Hemoglobin at time of discharge was stable at 8.4. Patient was continued on Eliquis  during hospitalization given high risk of stroke with CHA2DS2-VASc of 9.  CHF Patient without evidence of hypervolemia during stay; metoprolol  and torsemide  held given low-normal blood pressures.  Other conditions that were chronic and stable: T2DM, intertrigo  Issues for follow up: Continue wound care and refer to [facility] per [facility] Surgery Home torsemide  and blood pressure medicines held in the setting of persistent borderline hypotension Repeat CBC, BMP 1 week Evaluate need for ongoing nystatin  therapy for  intertrigo Evaluate need for ongoing triamcinolone  ointment for venous stasis dermatitis

## 2024-05-19 NOTE — ED Triage Notes (Signed)
 Pt fell from chair and stayed on floor for 2 hours. No noted injuries. C/O bed sores on bottom for 2 months. Denies hitting head. Pt is on eliquis . Axox4

## 2024-05-19 NOTE — Telephone Encounter (Signed)
 Delay in documentation:  Received additional VM yesterday afternoon regarding issues with transportation to appointments. Called son back yesterday around 5:30 om.   When patient was discharged from rehab, they had to hire a private ambulance.   I advised son to [employer] as they are often able to assist with transportation needs.   Case management/ [facility] referral may also be helpful as patient and family navigate patient's new needs.   Will forward to PCP for possible referral placement. Son will call back this morning if they are unable to keep appointment this afternoon due to transportation issues.   Chiquita JAYSON English, RN

## 2024-05-19 NOTE — Assessment & Plan Note (Addendum)
 Patient on Trulicity  1.5 mg outpatient.  A1c 7.8 in March. - SSI due to CKD - Updated A1c to help inform wound healing

## 2024-05-19 NOTE — ED Provider Triage Note (Signed)
 Emergency Medicine Provider Triage Evaluation Note  [name] , a 83 y.o. female  was evaluated in triage.  Pt complains of mechanical fall out of her chair.  Was on the ground for approximately 2 hours.  Unable to get up by herself.  She denies hitting her head or losing consciousness.  She is anticoagulated.  She does complain of some open sores over her left leg which are chronic.  Review of Systems  Positive:  Negative: See above   Physical Exam  BP (!) 114/92   Pulse 91   Temp (!) 97.5 F (36.4 C) (Oral)   Resp 16   Ht 5' 7 (1.702 m)   LMP  (LMP Unknown)   SpO2 100%   BMI 37.98 kg/m  Gen:   Awake, no distress   Resp:  Normal effort  MSK:   Moves extremities without difficulty  Other:    Medical Decision Making  Medically screening exam initiated at 10:42 AM.  Appropriate orders placed.  [name] was informed that the remainder of the evaluation will be completed by another provider, this initial triage assessment does not replace that evaluation, and the importance of remaining in the ED until their evaluation is complete.     Theotis Peers [place], [place] 05/19/24 1044

## 2024-05-19 NOTE — Assessment & Plan Note (Addendum)
 Approximately 10 cm sacral ulcer, bone appreciated on exam. CT abdomen pelvis without contrast shows focal area of sacral fracture with adjacent air that may represent a traumatic fracture or osteomyelitis. Patient refused MRI due to severe claustrophobia. With bone visible and findings on CT, will treat empirically as osteomyelitis.  Reassuringly hemodynamically stable at this time with reassuring vitals..  Lactic acid downtrending, no concern for sepsis at this time. - [facility] will admit, attending Dr. Donzetta for antibiotics -Consider consult to infectious disease for further antibiotic dosing - Vancomycin  and cefepime  per pharmacy, Flagyl  500 mg every 12 hours for now -Can consider bone biopsy for definitive diagnosis based on patient preference given patient's strict refusal of MRI even with anxiolytics - Wound care consulted - Tylenol  1000 mg every 6 hours -Home tramadol  50 mg every 8 hours - Dilaudid  1 mg every 4 hours as needed for severe pain - Turn patient every 2 hours for pressure relief, will discuss with nursing for pressure relief bedding - PT and OT - Blood cultures pending

## 2024-05-19 NOTE — Progress Notes (Deleted)
    SUBJECTIVE:   CHIEF COMPLAINT / HPI:   Concern for sacral pressure ulcer Pt was recently discharged from rehab facility, now has home health PT ***  PERTINENT  PMH / PSH: ***  OBJECTIVE:   LMP  (LMP Unknown)   ***  ASSESSMENT/PLAN:   Assessment & Plan    Payton Coward, MD [facility]

## 2024-05-19 NOTE — H&P (Cosign Needed Addendum)
 Hospital Admission History and Physical Service Pager: [phone]  Patient name: [name] Medical record number: [id number] Date of Birth: 04/05/1941 Age: 83 y.o. Gender: female  Primary Care Provider: Tharon Lung, MD Consultants: None Code Status: FULL Preferred Emergency Contact: [name], son  Contact Information     Name Relation Home Work Mobile   [name], [name] Fairy [place]ades   [phone]      Other Contacts   None on File    Chief Complaint: Fall, sacral wound  Differential and Medical Decision Making [name] is a 83 y.o. female presenting with fall and sacral ulcer/fracture.  Sacral ulcer likely in the setting of prolonged rehabilitation stay and difficulty with frequent repositioning.  Osteomyelitis remains in the differential (possibility as seen on CT abdomen and pelvis, patient refused MRI 2/2 claustrophobia, bone appreciated on physical exam).   Fall likely secondary to deconditioning with balance and coordination issues with advancing age and history of stroke.  She also has longstanding decreasing mobility after her recent hospitalizations and rehabilitation stays.  Reassuringly no signs of cardiac or neurogenic causes of syncope on history. Assessment & Plan Sacral fracture with ulcer and possible osteomyelitis Approximately 10 cm sacral ulcer, bone appreciated on exam. CT abdomen pelvis without contrast shows focal area of sacral fracture with adjacent air that may represent a traumatic fracture or osteomyelitis. Patient refused MRI due to severe claustrophobia. With bone visible and findings on CT, will treat empirically as osteomyelitis.  Reassuringly hemodynamically stable at this time with reassuring vitals..  Lactic acid downtrending, no concern for sepsis at this time. - FMTS will admit, attending Dr. Donzetta for antibiotics -Consider consult to infectious disease for further antibiotic dosing - Vancomycin  and cefepime  per pharmacy, Flagyl  500  mg every 12 hours for now -Can consider bone biopsy for definitive diagnosis based on patient preference given patient's strict refusal of MRI even with anxiolytics - Wound care consulted - Tylenol  1000 mg every 6 hours -Home tramadol  50 mg every 8 hours - Dilaudid  1 mg every 4 hours as needed for severe pain - Turn patient every 2 hours for pressure relief, will discuss with nursing for pressure relief bedding - PT and OT - Blood cultures pending Fall Patient has had coordination and balance issues with increasing age and her history of stroke.  Recent hospitalization and rehab stay further compounding picture with profound deconditioning.  Patient fell at home and endorses pain in the left hip that radiates upward.  No bruising of the hip noted on exam.  No acute fractures other than sacral fracture as above noted on x-ray. - Pain control as above - Lidocaine  patch over left hip - PT and OT as above Type 2 diabetes mellitus (HCC) Patient on Trulicity  1.5 mg outpatient.  A1c 7.8 in March. - SSI due to CKD - Updated A1c to help inform wound healing CKD (chronic kidney disease), stage III (HCC) Kidney function appears to be worsening on chart review.  Creatinine 2.35 today, GFR 20.  Mucous membranes dry on exam.  Patient has received roughly 1 L of fluids between bolus and IV antibiotics in the ED. - Will continue to monitor with a.m. BMP - Encourage p.o. intake - Can consider additional fluid's as needed, caution 2/2 CHF CHF (congestive heart failure) (HCC) Venous stasis dermatitis of both lower extremities Echo in March 2025 with LVEF 35 to 40%.  Appears at baseline volume status.  She does have evidence of venous stasis dermatitis on exam. -Continue  home metoprolol  -Continue home torsemide , watch for renal function as above -Triamcinolone  0.1% ointment to bilateral lower legs for venous stasis dermatitis  FEN/GI: N.p.o. until swallow evaluation, then renal diet with 1200 mL fluid  restriction VTE Prophylaxis: Home eliquis   Disposition: Med-tele  History of Present Illness:  [name] is a 83 y.o. female presenting with fall and sacral ulcer/fracture.  Recently left the rehab facility on Tuesday, 7/1 after a prolonged visit after CHF exacrerbation. She notes she has fallen multiple times while in rehab and also at home. She notes that it has been hard since being back home, but her granddaughter (CNA) and son have been helping her with her ADLs and medications.  She fell today after misstepping on some pillows. Her son came home and found her on the floor. EMS was called since he could not get her back to standing. The fall today, she did not hit her head. Of note, she has not been walking as much since her rehab stay due to worsening balance. She denies palpitations, chest pain, SOB, or dizziness before the fall.  She notes a sacral ulcer that was not taken care of much while in rehab. She is having a lot of pain there. No systemic symptoms. No nausea, vomiting, diarrhea, or constipation.  She notes also having SOB for a long time now, since her last time in the hospital. She is also very thirsty. No cough, runny nose, fever. No on oxygen at home usually. She has not been sleeping very good.  In the ED, patient's vitals are stable.  Blood cultures were collected.  Head CT showed no acute abnormality, stable remote infarcts, chronic advanced atrophy and stable white matter disease.  Left and right hip x-rays showed no fractures in either hip.  ET abdomen pelvis shows deep sacral ulcer extending to level of distal sacrum, focal area of sacral fracture and fragmentation with adjacent air that may recommend traumatic fracture or osteomyelitis.  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   Pertinent Social History: Tobacco use: Never Alcohol use: Never Other Substance use: Never Lives with son  Pertinent Family History: Mother - clotting disorder, cerebral hemorrhage Father - emphysema Brother - [name] cancer Remainder reviewed in history tab.   Important Outpatient Medications: Amiodarone  200 mg Eliquis  2.5 mg Atorvastatin  40 mg Cholecalciferol  25 mcg Voltaren  1% gel Colace 100 mg Trulicity  1.5 mg / 0.5 mL Ferrous sulfate  325 mg Megace  40 mg Metoprolol  succinate 25 mg  Nitroglycerin  0.4 mg Ropinirole  0.5 mg Torsemide  20 mg Tramadol  50 mg Remainder reviewed in medication history.   Objective: BP 116/83   Pulse 89   Temp 98.4 F (36.9 C)   Resp (!) 21   Ht 5' 7 (1.702 m)   LMP  (LMP Unknown)   SpO2 100%   BMI 37.98 kg/m  Exam: General: Awake, alert, chronically ill elderly female, appears to be in pain Eyes: Nonicteric ENTM: Right lips, dry mucous membranes Cardiovascular: RRR, no M/R/G Respiratory: CTAB, normal work of breathing on room air  Gastrointestinal: Soft, nontender, bowel sounds present Derm: Bruises on patient's forearms bilaterally, sacral ulcer 10 cm in diameter, bone appreciated Neuro: No  gross focal deficits Psych: Mood and affect appropriate  Labs:  CBC BMET  Recent Labs  Lab 05/19/24 1120  WBC 11.8*  HGB 9.5*  HCT 30.7*  PLT 256   Recent Labs  Lab 05/19/24 1120  NA 136  K 3.8  CL 102  CO2 20*  BUN 61*  CREATININE 2.35*  GLUCOSE 175*  CALCIUM  9.9     EKG: Pending   Imaging Studies Performed:  Impression from Radiologist:  CT head without contrast: 1. No acute intracranial abnormality related to the reported head trauma. 2. Stable remote infarcts involving the posterior right insular cortex,  right lentiform nucleus, and right caudate head. 3. Chronic advanced atrophy and white matter disease is stable.  Left hip with pelvis x-ray: The sacrum is partially obscured by fecal material in the rectum. Otherwise, no acute, displaced fracture or dislocation present in either hip. As underlying osteopenia decreases the sensitivity for nondisplaced fracture using plain radiography, if the patient is unable to bear weight, or there is high clinical suspicion, a follow-up CT should be considered for further evaluation.   Right hip with pelvis x-ray: The sacrum is partially obscured by fecal material in the rectum. Otherwise, no acute, displaced fracture or dislocation present in either hip. As underlying osteopenia decreases the sensitivity for nondisplaced fracture using plain radiography, if the patient is unable to bear weight, or there is high clinical suspicion, a follow-up CT should be considered for further evaluation.  CT abdomen pelvis without contrast: 1. Deep sacral ulcer extending to the level of the distal sacrum. Focal area of sacral fracture and fragmentation with adjacent air may represent a traumatic fracture or osteomyelitis. No drainable fluid collection or abscess. 2. Partially visualized small bilateral pleural effusions with partial compressive atelectasis of the lower lobes. 3. Cholelithiasis. 4.  Aortic Atherosclerosis (ICD10-I70.0).  My Interpretation: Left hip with pelvis x-ray: No acute fractures Right hip with pelvis x-ray: No acute fractures  Lennie Raguel MATSU, DO 05/19/2024, 7:36 PM PGY-1, [facility]  FPTS Intern pager: [phone], text pages welcome Secure chat group [facility] Teaching Service   I agree with the assessment and plan as documented above.  Stuart Redo, MD PGY-3, [facility]

## 2024-05-19 NOTE — Assessment & Plan Note (Signed)
 Echo in March 2025 with LVEF 35 to 40%.  Appears at baseline volume status.  She does have evidence of venous stasis dermatitis on exam. -Continue home metoprolol  -Continue home torsemide , watch for renal function as above -Triamcinolone  0.1% ointment to bilateral lower legs for venous stasis dermatitis

## 2024-05-19 NOTE — ED Notes (Signed)
 Patient transported to CT

## 2024-05-19 NOTE — Telephone Encounter (Signed)
 Son returns call to nurse line.   He states that they will need to cancel appointment for this afternoon as patient is in the ED for a fall that occurred this AM.   Cancelled appointment per request.   Chiquita JAYSON English, RN

## 2024-05-19 NOTE — Assessment & Plan Note (Addendum)
 Kidney function appears to be worsening on chart review.  Creatinine 2.35 today, GFR 20.  Mucous membranes dry on exam.  Patient has received roughly 1 L of fluids between bolus and IV antibiotics in the ED. - Will continue to monitor with a.m. BMP - Encourage p.o. intake - Can consider additional fluid's as needed, caution 2/2 CHF

## 2024-05-19 NOTE — Progress Notes (Signed)
 Pharmacy Antibiotic Note  [name] is a 83 y.o. female admitted on 05/19/2024 with osteomyelitis.  Pharmacy has been consulted for vancomycin  and cefepime  dosing; also on flagyl .  Plan: Cefepime  2g IV q24h Vancomycin  2g IV given in ED, continue with 1g IV q48h starting 7/10 for estimated AUC 490 using SCr 2.35, Vd 0.5 Check vancomycin  levels at steady state, goal AUC 400-550 Follow up renal function, cultures as available, clinical progress, length of tx  Height: 5' 7 (170.2 cm) IBW/kg (Calculated) : 61.6  Temp (24hrs), Avg:98 F (36.7 C), Min:97.5 F (36.4 C), Max:98.5 F (36.9 C)  Recent Labs  Lab 05/19/24 1120 05/19/24 1732 05/19/24 1902  WBC 11.8*  --   --   CREATININE 2.35*  --   --   LATICACIDVEN  --  2.5* 1.3    Estimated Creatinine Clearance: 22.4 mL/min (A) (by C-G formula based on SCr of 2.35 mg/dL (H)).    Allergies  Allergen Reactions   Benazepril Other (See Comments)    Unknown reaction at age 53-65 - possibly dizziness   Cymbalta  [Duloxetine  Hcl]     Itch, Dry mouth    Ozempic  (0.25 Or 0.5 Mg-Dose) [Semaglutide (0.25 Or 0.5mg -Dos)] Diarrhea    Gi intollerance   Tape     TOLERATES PAPER TAPE ONLY- due to thin skin. NOT ALLERGY PER PT   Angiotensin Receptor Blockers Other (See Comments)    Hypotension reaction   Bactrim [Sulfamethoxazole-Trimethoprim] Other (See Comments)    Unknown reaction   Fe-Succ-C-Thre-B12-Des Stomach Other (See Comments)    Unknown reaction  Ferocon?    Antimicrobials this admission: Vanc 7/8 >> Cefepime  7/8 >> Flagyl  7/8 >>  Dose adjustments this admission:  Microbiology results: 7/8 BCx:  Thank you for allowing pharmacy to be a part of this patient's care.  Rocky Slade, PharmD, BCPS 05/19/2024 9:35 PM

## 2024-05-20 ENCOUNTER — Inpatient Hospital Stay (HOSPITAL_COMMUNITY)

## 2024-05-20 DIAGNOSIS — L89154 Pressure ulcer of sacral region, stage 4: Secondary | ICD-10-CM | POA: Diagnosis not present

## 2024-05-20 DIAGNOSIS — L98424 Non-pressure chronic ulcer of back with necrosis of bone: Secondary | ICD-10-CM

## 2024-05-20 DIAGNOSIS — Z7401 Bed confinement status: Secondary | ICD-10-CM | POA: Diagnosis not present

## 2024-05-20 DIAGNOSIS — N179 Acute kidney failure, unspecified: Secondary | ICD-10-CM

## 2024-05-20 DIAGNOSIS — M4628 Osteomyelitis of vertebra, sacral and sacrococcygeal region: Secondary | ICD-10-CM | POA: Diagnosis not present

## 2024-05-20 LAB — CBC
HCT: 26.4 % — ABNORMAL LOW (ref 36.0–46.0)
HCT: 27 % — ABNORMAL LOW (ref 36.0–46.0)
Hemoglobin: 8 g/dL — ABNORMAL LOW (ref 12.0–15.0)
Hemoglobin: 8.3 g/dL — ABNORMAL LOW (ref 12.0–15.0)
MCH: 27.9 pg (ref 26.0–34.0)
MCH: 27.9 pg (ref 26.0–34.0)
MCHC: 30.3 g/dL (ref 30.0–36.0)
MCHC: 30.7 g/dL (ref 30.0–36.0)
MCV: 92 fL (ref 80.0–100.0)
MCV: 90.6 fL (ref 80.0–100.0)
Platelets: 233 K/uL (ref 150–400)
Platelets: 216 K/uL (ref 150–400)
RBC: 2.87 MIL/uL — ABNORMAL LOW (ref 3.87–5.11)
RBC: 2.98 MIL/uL — ABNORMAL LOW (ref 3.87–5.11)
RDW: 21.4 % — ABNORMAL HIGH (ref 11.5–15.5)
RDW: 21.4 % — ABNORMAL HIGH (ref 11.5–15.5)
WBC: 10.1 K/uL (ref 4.0–10.5)
WBC: 10.3 K/uL (ref 4.0–10.5)
nRBC: 0 % (ref 0.0–0.2)
nRBC: 0 % (ref 0.0–0.2)

## 2024-05-20 LAB — HEMOGLOBIN A1C
Hgb A1c MFr Bld: 6.2 % — ABNORMAL HIGH (ref 4.8–5.6)
Mean Plasma Glucose: 131 mg/dL

## 2024-05-20 LAB — BASIC METABOLIC PANEL WITH GFR
Anion gap: 10 (ref 5–15)
BUN: 56 mg/dL — ABNORMAL HIGH (ref 8–23)
CO2: 18 mmol/L — ABNORMAL LOW (ref 22–32)
Calcium: 8.8 mg/dL — ABNORMAL LOW (ref 8.9–10.3)
Chloride: 106 mmol/L (ref 98–111)
Creatinine, Ser: 2.28 mg/dL — ABNORMAL HIGH (ref 0.44–1.00)
GFR, Estimated: 21 mL/min — ABNORMAL LOW (ref >60)
Glucose, Bld: 103 mg/dL — ABNORMAL HIGH (ref 70–99)
Potassium: 3.9 mmol/L (ref 3.5–5.1)
Sodium: 134 mmol/L — ABNORMAL LOW (ref 135–145)

## 2024-05-20 LAB — GLUCOSE, CAPILLARY
Glucose-Capillary: 91 mg/dL (ref 70–99)
Glucose-Capillary: 92 mg/dL (ref 70–99)
Glucose-Capillary: 103 mg/dL — ABNORMAL HIGH (ref 70–99)
Glucose-Capillary: 130 mg/dL — ABNORMAL HIGH (ref 70–99)

## 2024-05-20 LAB — MAGNESIUM: Magnesium: 1.9 mg/dL (ref 1.7–2.4)

## 2024-05-20 MED ORDER — SODIUM CHLORIDE 0.9 % IV SOLN
3.0000 g | Freq: Two times a day (BID) | INTRAVENOUS | Status: AC
  Administered 2024-05-20 – 2024-05-24 (×8): 3 g via INTRAVENOUS
  Filled 2024-05-20 (×8): qty 8

## 2024-05-20 MED ORDER — MAGNESIUM SULFATE IN D5W 1-5 GM/100ML-% IV SOLN
1.0000 g | Freq: Once | INTRAVENOUS | Status: AC
  Administered 2024-05-20: 1 g via INTRAVENOUS
  Filled 2024-05-20: qty 100

## 2024-05-20 MED ORDER — POTASSIUM CHLORIDE CRYS ER 20 MEQ PO TBCR
40.0000 meq | EXTENDED_RELEASE_TABLET | Freq: Once | ORAL | Status: AC
  Administered 2024-05-20: 40 meq via ORAL
  Filled 2024-05-20: qty 2

## 2024-05-20 MED ORDER — LORAZEPAM 2 MG/ML PO CONC: 1.0000 mg | Freq: Once | ORAL | Status: DC

## 2024-05-20 MED ORDER — DAKINS (1/4 STRENGTH) 0.125 % EX SOLN
Freq: Two times a day (BID) | CUTANEOUS | Status: AC
  Filled 2024-05-20 (×2): qty 473

## 2024-05-20 MED ORDER — LORAZEPAM 2 MG/ML PO CONC: 1.0000 mg | ORAL | Status: DC | PRN

## 2024-05-20 NOTE — Evaluation (Signed)
 Clinical/Bedside Swallow Evaluation Patient Details  Name: [name] MRN: [id number] Date of Birth: 14-Apr-1941  Today's Date: 05/20/2024 Time: SLP Start Time (ACUTE ONLY): 1451 SLP Stop Time (ACUTE ONLY): 1502 SLP Time Calculation (min) (ACUTE ONLY): 11 min  Past Medical History:  Past Medical History:  Diagnosis Date   Acute cystitis with hematuria 09/01/2020   ANXIETY 01/31/2009   Qualifier: Diagnosis of  By: Leana, CNA, Christy     Arthritis    qwhere (03/30/2016)   Bilateral carpal tunnel syndrome 02/13/2021   CAD (coronary artery disease) 05/2008   a. s/p CABG in 2009.   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 Postoperative in 2009;  S/P transesophageal echocardiography-guided cardioversion, previously on Amiodarone  and Coumadin. b. recurrent in April 2013 -> pt elected rate control strategy, initially Coumadin -> had stroke with subtherapeutic INR in 03/2016 and transitioned to Eliquis .   Macular degeneration    Melena 01/10/2021   Morbid obesity (HCC) 03/26/2017   OSA on CPAP    severe OSA with AHI 34/hr   Proliferative diabetic retinopathy of right eye (HCC) 04/17/2021   Found today at delivery of PRP for what was  thought to be severe NPDR OD   Rectal bleeding    Thrombocytopenia (HCC)    Past Surgical History:  Past Surgical History:  Procedure Laterality Date   APPENDECTOMY  1946   BACK SURGERY     CARPAL TUNNEL RELEASE Left 03/28/2021   Procedure: LEFT CARPAL TUNNEL RELEASE;  Surgeon: Murrell Kuba, MD;  Location: [facility] OR;  Service: Orthopedics;  Laterality: Left;  60 MIN   CARPAL TUNNEL RELEASE Right 09/15/2021   Procedure: RIGHT CARPAL TUNNEL RELEASE;  Surgeon: Murrell Kuba, MD;  Location: [facility] OR;  Service: Orthopedics;  Laterality: Right;   CATARACT EXTRACTION Right 2012   COLONOSCOPY WITH PROPOFOL  Left 03/27/2017   Procedure: COLONOSCOPY WITH PROPOFOL ;  Surgeon: Saintclair Jasper, MD;  Location: [facility] ENDOSCOPY;  Service: Gastroenterology;  Laterality: Left;   CORONARY ANGIOPLASTY WITH STENT PLACEMENT  2009   put 2 stents in   EXCISION METACARPAL MASS Left 03/28/2021   Procedure: EXCISION MASS LEFT THUMB;  Surgeon: Murrell Kuba, MD;  Location: [facility] OR;  Service: Orthopedics;  Laterality: Left;   EYE SURGERY     bilateral cataract   HERNIA REPAIR     IR LUMBAR DISC ASPIRATION W/IMG GUIDE  10/08/2018   JOINT REPLACEMENT     TEE WITH CARDIOVERSION     Postoperative in 2009;  S/P transesophageal echocardiography-guided cardioversion,   TOTAL KNEE ARTHROPLASTY Right 1994   VENTRAL HERNIA REPAIR  10/1999   With mesh   HPI:  Pt is a 83 yr old female who presented due to a fall  and sacral wound. CT showed sacral fx. Pt recently return home from SNF. PMH: CHF, CKD 4, DM II, diabetic neuropathy, GERD, CVA 2018, Afib on eliquis , OSA and thrombocytopenia, macular degeneration, CAD, venous stasis    Assessment / Plan / Recommendation  Clinical Impression  Pt reports globus sensation most notably with pills and meat. She fed herself consecutive sips of thin liquids and regular solids without s/s of dysphagia or aspiration. The symptoms that the pt describes sound most consistent with a suspected esophageal component,  may consider more dedicated esophageal assessment if indicated. Education was provided regarding esophageal precautions given history of GERD. Continue current diet and SLP will sign off. SLP Visit Diagnosis: Dysphagia, unspecified (R13.10)    Aspiration Risk  Mild aspiration risk    Diet Recommendation Regular;Thin liquid    Liquid Administration via: Cup;Straw Medication Administration: Whole meds with puree Supervision: Patient able to self feed Compensations: Minimize environmental distractions;Slow rate;Small sips/bites Postural Changes: Seated upright at 90 degrees;Remain upright for at least 30 minutes after po intake    Other  Recommendations Recommended Consults: Consider esophageal assessment Oral Care Recommendations: Oral care BID     Assistance Recommended at Discharge    Functional Status Assessment Patient has not had a recent decline in their functional status  Frequency and Duration            Prognosis Prognosis for improved oropharyngeal function: Good Barriers to Reach Goals: Time post onset      Swallow Study   General HPI: Pt is a 83 yr old female who presented due to a fall and sacral wound. CT showed sacral fx. Pt recently return home from SNF. PMH: CHF, CKD 4, DM II, diabetic neuropathy, GERD, CVA 2018, Afib on eliquis , OSA and thrombocytopenia, macular degeneration, CAD, venous stasis Type of Study: Bedside Swallow Evaluation Previous Swallow Assessment: none in chart Diet Prior to this Study: Regular;Thin liquids (Level 0) Temperature Spikes Noted: No Respiratory Status: Room air History of Recent Intubation: No Behavior/Cognition: Alert;Cooperative Oral Cavity Assessment: Within Functional Limits Oral Care Completed by SLP: No Oral Cavity - Dentition: Missing dentition Vision: Functional for self-feeding Self-Feeding Abilities: Able to feed self Patient Positioning: Upright in bed Baseline Vocal Quality: Normal Volitional Cough:  Strong Volitional Swallow: Able to elicit    Oral/Motor/Sensory Function Overall Oral Motor/Sensory Function: Within functional limits   Ice Chips Ice chips: Not tested   Thin Liquid Thin Liquid: Within functional limits Presentation: Straw;Self Fed    Nectar Thick Nectar Thick Liquid: Not tested   Honey Thick Honey Thick Liquid: Not tested   Puree Puree: Not tested   Solid     Solid: Impaired Presentation: Self Fed Pharyngeal Phase Impairments: Throat Clearing - Immediate      Damien Blumenthal, M.A., CCC-SLP Speech Language Pathology, Acute Rehabilitation Services  Secure Chat preferred [phone]  05/20/2024,3:12 PM

## 2024-05-20 NOTE — Assessment & Plan Note (Addendum)
 Patient has had coordination and balance issues with increasing age and her history of stroke.  Recent hospitalization and rehab stay further compounding picture with profound deconditioning.  Patient fell at home and endorses pain in the left hip that radiates upward.  No bruising of the hip noted on exam.  No acute fractures other than sacral fracture as above noted on x-ray. - Pain control as above - Lidocaine  patch over left hip - PT and OT as above

## 2024-05-20 NOTE — Progress Notes (Signed)
 SLP Cancellation Note  Patient Details Name: [name] MRN: [id number] DOB: [birth year]   Cancelled treatment:       Reason Eval/Treat Not Completed: Patient at procedure or test/unavailable (MRI). SLP will f/u as schedule permits.    Damien Blumenthal, M.A., CCC-SLP Speech Language Pathology, Acute Rehabilitation Services  Secure Chat preferred [phone]  05/20/2024, 2:13 PM

## 2024-05-20 NOTE — Assessment & Plan Note (Addendum)
 Kidney function appears to be worsening on chart review, prior baseline ~1.8 in March 2025.  Creatinine 2.35 7/8, GFR 20.  Mucous membranes dry on exam.  Patient has received roughly 1 L of fluids between bolus and IV antibiotics in the ED. - Creatinine 2.28 7/9 - Encourage p.o. intake - Can consider additional fluid's as needed, caution 2/2 CHF

## 2024-05-20 NOTE — Consult Note (Signed)
 WOC Nurse Consult Note: Reason for Consult: Stage 4 sacral pressure injury, present on admission.  Wound type: stage 4  Pressure Injury POA: Yes Measurement: 7 cm x 5.5 cm wound bed 75% slough, bone palpable. Wound bed:75% slough, 25% pale pink nongranulating Drainage (amount, consistency, odor) moderate serosanguinous  musty odor Periwound: Skin injury, including deep tissue injury with maroon intact discoloration and peeling epithelium to bilateral buttocks.  Fracture to sacrum, osteomyelitis and on antibiotic coverage.  H & P mentions discuss with orthopedic team regarding debridement and this would improve potential for healing.  NPWT (VAC) is not appropriate at this time due to amount of necrotic tissue present in wound bed. Will implement DAKINS moist gauze to expedite debridement of devitalized tissue.  Dressing procedure/placement/frequency: Cleanse sacral wound with NS and pat dry. Apply DAKINS moist gauze to wound bed.  Top with dry gauze and sacral foam. Change each shift.  Patient is on low air loss mattress.  Patient states her appetite is poor.  For optimal healing, recommend nutritional consult  Will not follow at this time.  Please re-consult if needed.  Darice Cooley MSN, RN, FNP-BC CWON Wound, Ostomy, Continence Nurse Outpatient [facility] [phone] Pager [phone]

## 2024-05-20 NOTE — Evaluation (Signed)
 Occupational Therapy Evaluation Patient Details Name: [name] MRN: [id number] DOB: 03-23-1941 Today's Date: 05/20/2024   History of Present Illness   Pt is a 83 yr old female who presented due to a fall and sacral wound. CT showed sacral fx. Pt recently return home from SNF. PMH: CHF, CKD 4, DM II, diabetic neuropathy, GERD, CVA 2018, Afib on eliquis , OSA and thrombocytopenia, macular degeneration, CAD, venous stasis     Clinical Impressions Pt reported they just return from SNF but was ambulating around the home with use of 4WW and had assist with all ADLS/IADLS from family or caregiver who came 3 x a week. At this time she requires she is able to complete some light face hygiene tasks post set up of all materials within reach and all other ADLS require max-total x2 at bed level. Patient will benefit from continued inpatient follow up therapy, <3 hours/day.      If plan is discharge home, recommend the following:   Two people to help with walking and/or transfers;Two people to help with bathing/dressing/bathroom;Assistance with cooking/housework;Assistance with feeding;Direct supervision/assist for medications management;Direct supervision/assist for financial management;Assist for transportation;Help with stairs or ramp for entrance     Functional Status Assessment   Patient has had a recent decline in their functional status and demonstrates the ability to make significant improvements in function in a reasonable and predictable amount of time.     Equipment Recommendations    (TBD but may need WC)     Recommendations for Other Services         Precautions/Restrictions   Precautions Precautions: Fall Recall of Precautions/Restrictions: Intact Restrictions Weight Bearing Restrictions Per Provider Order: No     Mobility Bed Mobility Overal bed mobility: Needs Assistance Bed Mobility: Rolling Rolling: Max assist, Used rails, +2 for physical assistance, +2  for safety/equipment              Transfers                   General transfer comment: NT      Balance                                           ADL either performed or assessed with clinical judgement   ADL Overall ADL's : Needs assistance/impaired Eating/Feeding: Minimal assistance;Sitting   Grooming: Wash/dry face;Set up Grooming Details (indicate cue type and reason): has to be set up within reach Upper Body Bathing: Maximal assistance;Bed level   Lower Body Bathing: Total assistance;+2 for physical assistance;+2 for safety/equipment;Bed level   Upper Body Dressing : Maximal assistance;Bed level   Lower Body Dressing: Total assistance;+2 for physical assistance;+2 for safety/equipment;Maximal assistance       Toileting- Clothing Manipulation and Hygiene: Total assistance;+2 for physical assistance;+2 for safety/equipment               Vision         Perception         Praxis         Pertinent Vitals/Pain Pain Assessment Pain Assessment: 0-10 Pain Score: 8  Pain Location: bottom/skin Pain Descriptors / Indicators: Discomfort, Grimacing, Guarding Pain Intervention(s): Limited activity within patient's tolerance, Monitored during session, Patient requesting pain meds-RN notified     Extremity/Trunk Assessment Upper Extremity Assessment Upper Extremity Assessment: RUE deficits/detail;LUE deficits/detail RUE Deficits / Details: very limited BUE AROM and reported  this is chronic and only about 20-25 deg shoulder flexion/abduction RUE Sensation: WNL RUE Coordination: decreased gross motor LUE Deficits / Details: very limited BUE AROM and reported this is chronic and only about 20-25 deg shoulder flexion/abduction LUE Sensation: WNL LUE Coordination: decreased gross motor   Lower Extremity Assessment Lower Extremity Assessment: Defer to PT evaluation       Communication Communication Communication: No apparent  difficulties   Cognition Arousal: Alert Behavior During Therapy: WFL for tasks assessed/performed                                 Following commands: Intact       Cueing  General Comments   Cueing Techniques: Verbal cues  decrease skin integrity   Exercises     Shoulder Instructions      Home Living Family/patient expects to be discharged to:: Private residence Living Arrangements: Children Available Help at Discharge: Family;Available PRN/intermittently Type of Home: Mobile home Home Access: Ramped entrance     Home Layout: One level     Bathroom Shower/Tub: Producer, television/film/video: Handicapped height     Home Equipment: Grab bars - tub/shower;Grab bars - toilet;Hand held Stage manager (4 wheels);BSC/3in1;Lift chair   Additional Comments: adjustable bed, has a hand rail      Prior Functioning/Environment Prior Level of Function : Needs assist       Physical Assist : ADLs (physical)   ADLs (physical): Bathing Mobility Comments: Pt reported ambulating around the home with 4WW ADLs Comments: CNA comes for 1 hour x3 a week. Help for bathing/dressing and son helps with cooking and housekeeping.    OT Problem List: Pain;Decreased strength;Decreased activity tolerance;Impaired balance (sitting and/or standing);Decreased safety awareness;Decreased knowledge of use of DME or AE   OT Treatment/Interventions: Self-care/ADL training;Therapeutic exercise;DME and/or AE instruction;Therapeutic activities;Patient/family education;Balance training      OT Goals(Current goals can be found in the care plan section)   Acute Rehab OT Goals Patient Stated Goal: to go home OT Goal Formulation: With patient Time For Goal Achievement: 06/03/24 Potential to Achieve Goals: Fair   OT Frequency:  Min 2X/week    Co-evaluation              AM-PAC OT 6 Clicks Daily Activity     Outcome Measure Help from another person eating meals?: A  Little Help from another person taking care of personal grooming?: A Little Help from another person toileting, which includes using toliet, bedpan, or urinal?: Total Help from another person bathing (including washing, rinsing, drying)?: Total Help from another person to put on and taking off regular upper body clothing?: A Lot Help from another person to put on and taking off regular lower body clothing?: Total 6 Click Score: 11   End of Session Nurse Communication: Mobility status  Activity Tolerance: Patient limited by fatigue;Patient limited by pain Patient left: in bed;with nursing/sitter in room  OT Visit Diagnosis: Unsteadiness on feet (R26.81);Other abnormalities of gait and mobility (R26.89);Repeated falls (R29.6);Muscle weakness (generalized) (M62.81);Pain Pain - Right/Left:  (hips/bottom/skin)                Time: 9281-9194 OT Time Calculation (min): 47 min Charges:  OT General Charges $OT Visit: 1 Visit OT Evaluation $OT Eval Moderate Complexity: 1 Mod OT Treatments $Self Care/Home Management : 23-37 mins  Warrick POUR OTR/L  Acute Rehab Services  [phone] office number   Warrick Berber 05/20/2024,  8:16 AM

## 2024-05-20 NOTE — Assessment & Plan Note (Addendum)
 Approximately 10 cm sacral ulcer, bone appreciated on exam. CT abdomen pelvis without contrast shows focal area of sacral fracture with adjacent air that may represent a traumatic fracture or osteomyelitis.  Reassuringly hemodynamically stable at this time with reassuring vitals and lack of neurological deficits.  Lactic acid downtrending, no concern for sepsis at this time. -Patient initially refused MRI due to severe claustrophobia. Today she agreed to hear about possible sedation options, was also agreeable to bone biopsy as alternative to inform care. With bone visible and findings on CT, will treat empirically as osteomyelitis. - FMTS will admit, attending Dr. Donzetta for antibiotics - Vancomycin  and cefepime  per pharmacy, Flagyl  500 mg every 12 hours for now - Consult ID for antibiotic dosing, consider bone biopsy - Wound care consulted - Tylenol  1000 mg every 6 hours -Home tramadol  50 mg every 8 hours - Dilaudid  1 mg every 4 hours as needed for severe pain - Turn patient every 2 hours for pressure relief, will discuss with nursing for pressure relief bedding - PT and OT - Blood cultures NGTD at 1 day

## 2024-05-20 NOTE — Plan of Care (Signed)

## 2024-05-20 NOTE — Consult Note (Addendum)
 Date of Admission:  05/19/2024          Reason for Consult: Sacral osteomyelitis    Referring Provider: Otto Fairly, MD   Assessment:  Stage IV decubitus ulcer with exposed bone = osteomyelitis, likely chronic by story with a fracture Hx of CVA she has now left her largely bed and wheelchair confined Acute on chronic renal failure Diabetes mellitus  Plan:  Narrow to Unasyn  Follow-up blood cultures Followup on MRI read Would consult general surgery for debridement of necrotic tissue I do not think you will find anybody who is going get a bone culture with from her sacral wound If she improves would send her out with reviewed a course of antibiotics for example Augmentin  to complete 2 to 3 weeks in total  I will sign off for now.  Please call with further questions.  Principal Problem:   Sacral fracture with ulcer and possible osteomyelitis Active Problems:   CHF (congestive heart failure) (HCC)   Type 2 diabetes mellitus (HCC)   Fall   CKD (chronic kidney disease), stage III (HCC)   Venous stasis dermatitis of both lower extremities   Scheduled Meds:  acetaminophen   1,000 mg Oral Q6H   amiodarone   200 mg Oral Daily   apixaban   2.5 mg Oral BID   atorvastatin   40 mg Oral Daily   brimonidine   1 drop Both Eyes BID   cholecalciferol   1,000 Units Oral Daily   cyanocobalamin   1,000 mcg Oral Daily   ferrous sulfate   325 mg Oral Q breakfast   insulin  aspart  0-9 Units Subcutaneous TID WC   lidocaine   1 patch Transdermal Q24H   multivitamin  1 tablet Oral Daily   polyethylene glycol  17 g Oral BID   rOPINIRole   1 mg Oral QHS   senna  1 tablet Oral BID   sodium hypochlorite   Irrigation BID   traMADol   50 mg Oral Q8H   triamcinolone  ointment   Topical BID   Continuous Infusions: PRN Meds:.HYDROmorphone  (DILAUDID ) injection, LORazepam , mineral oil-hydrophilic petrolatum   HPI: [name] is a 83 y.o. female who has had a stroke this spring and has been  unable to walk since then largely being fine to her bed or a chair who is developed a deep stage IV decubitus ulcer.  She had been residing in a skilled facility but now is living back at home with her son who lives there.  She was admitted after a fall to the family medicine service.  She was found to have a deep stage IV sacral decubitus ulcer with exposed bone.  She had a CT of the abdomen pelvis performed which showed A deep sacral ulcer extending to the level of the distal sacrum with a focal area of sacral fracture and fragmentation with adjacent air that could be traumatic fracture versus osteomyelitis.  She has been placed on broad-spectrum antibiotics in the form of vancomycin  cefepime  and metronidazole .  She did have blood cultures taken before starting antibiotics.  He does have acute on chronic renal failure.  She has been evaluated by wound care found a large amount of necrotic tissue present.  Suspect this is a chronic osteomyelitis with a superimposed fracture.  I will simplify her antibiotics for now to Unasyn .  I would recommend consulting with general surgery to see if they believe some of the tissue needs to be debrided.  If they do so and it could be helpful if it  is sent for culture.  If we do not have anything specific to go off of culture wise I would prefer to to use fairly narrow spectrum antibiotics that cover typical suspects from the  aerobes and anaerobes and Unasyn  would accomplish that.  If she were to DC would change to augmentin  at DC and provide total of 2 weeks of treatment since this appears by history to be a chronic osteomyelitis  Offloading the area with pressure frequent turning and nutrition are critical to resolving these issues she is not going to likely ever be able to walk again and she does not seem strong enough to build to turn herself.   I have personally spent 80 minutes involved in face-to-face and non-face-to-face activities for  this patient on the day of the visit. Professional time spent includes the following activities: Preparing to see the patient (review of tests), Obtaining and/or reviewing separately obtained history (admission/discharge record), Performing a medically appropriate examination and/or evaluation , Ordering medications/tests/procedures, referring and communicating with other health care professionals, Documenting clinical information in the EMR, Independently interpreting results (not separately reported), Communicating results to the patient/family/caregiver, Counseling and educating the patient/family/caregiver and Care coordination (not separately reported).   Evaluation of the patient requires complex antimicrobial therapy evaluation, counseling , isolation needs to reduce disease transmission and risk assessment and mitigation.   I will sign off for now please call with further questions.   Review of Systems: Review of Systems  Constitutional:  Negative for chills, fever, malaise/fatigue and weight loss.  HENT:  Negative for congestion and sore throat.   Eyes:  Negative for blurred vision and photophobia.  Respiratory:  Negative for cough, shortness of breath and wheezing.   Cardiovascular:  Negative for chest pain, palpitations and leg swelling.  Gastrointestinal:  Negative for abdominal pain, blood in stool, constipation, diarrhea, heartburn, melena, nausea and vomiting.  Genitourinary:  Negative for dysuria, flank pain and hematuria.  Musculoskeletal:  Positive for falls. Negative for back pain, joint pain and myalgias.  Skin:  Negative for itching and rash.  Neurological:  Positive for weakness. Negative for dizziness, focal weakness, loss of consciousness and headaches.  Endo/Heme/Allergies:  Does not bruise/bleed easily.  Psychiatric/Behavioral:  Negative for depression and suicidal ideas. The patient does not have insomnia.     Past Medical History:  Diagnosis Date   Acute cystitis  with hematuria 09/01/2020   ANXIETY 01/31/2009   Qualifier: Diagnosis of  By: Leana, CNA, Christy     Arthritis    qwhere (03/30/2016)   Bilateral carpal tunnel syndrome 02/13/2021   CAD (coronary artery disease) 05/2008   a. s/p CABG in 2009.   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 Postoperative  in 2009;  S/P transesophageal echocardiography-guided cardioversion, previously on Amiodarone  and Coumadin. b. recurrent in April 2013 -> pt elected rate control strategy, initially Coumadin -> had stroke with subtherapeutic INR in 03/2016 and transitioned to Eliquis .   Macular degeneration    Melena 01/10/2021   Morbid obesity (HCC) 03/26/2017   OSA on CPAP    severe OSA with AHI 34/hr   Proliferative diabetic retinopathy of right eye (HCC) 04/17/2021   Found today at delivery of PRP for what was thought to be severe NPDR OD   Rectal bleeding    Thrombocytopenia (HCC)     Social History   Tobacco Use   Smoking status: Never    Passive exposure: Never   Smokeless tobacco: Never  Vaping Use   Vaping status: Never Used  Substance Use Topics   Alcohol use: No   Drug use: Never    Family History  Problem Relation Age of Onset    Clotting disorder Mother        Cerebral hemorrhage   Other Mother        cerebral hemorrhage   Emphysema Father        COD   Lung cancer Brother    Heart disease Neg Hx    Allergies  Allergen Reactions   Benazepril Other (See Comments)    Unknown reaction at age 73-65 - possibly dizziness   Cymbalta  [Duloxetine  Hcl] Other (See Comments)    Itch, Dry mouth    Ozempic  (0.25 Or 0.5 Mg-Dose) [Semaglutide (0.25 Or 0.5mg -Dos)] Diarrhea    Gi intollerance   Tape Other (See Comments)    TOLERATES PAPER TAPE ONLY- due to thin skin. NOT ALLERGY PER PT   Angiotensin Receptor Blockers Other (See Comments)    Hypotension reaction   Bactrim [Sulfamethoxazole-Trimethoprim] Other (See Comments)    Unknown reaction   Fe-Succ-C-Thre-B12-Des Stomach Other (See Comments)    Unknown reaction  Ferocon?    OBJECTIVE: Blood pressure (!) 103/58, pulse 97, temperature 97.9 F (36.6 C), temperature source Oral, resp. rate 18, height 5' 7 (1.702 m), weight 93.1 kg, SpO2 100%.  Physical Exam Constitutional:      General: She is not in acute distress.    Appearance: Normal appearance. She is well-developed. She is not ill-appearing or diaphoretic.  HENT:     Head: Normocephalic and atraumatic.     Right Ear: Hearing and external ear normal.     Left Ear: Hearing and external ear normal.     Nose: No nasal deformity or rhinorrhea.  Eyes:     General: No scleral icterus.    Conjunctiva/sclera: Conjunctivae normal.     Right eye: Right conjunctiva is not injected.     Left eye: Left conjunctiva is not injected.     Pupils: Pupils are equal, round, and reactive to light.  Neck:     Vascular: No JVD.  Cardiovascular:     Rate and Rhythm: Normal rate and regular rhythm.     Heart sounds: S1 normal and S2 normal.  Abdominal:     General: Bowel sounds are normal. There is no distension.     Tenderness: There is no abdominal tenderness.  Musculoskeletal:        General: Normal range of motion.      Right shoulder: Normal.     Left shoulder: Normal.     Cervical back: Normal range of motion and neck supple.     Right hip: Normal.     Left hip: Normal.  Right knee: Normal.     Left knee: Normal.  Lymphadenopathy:     Head:     Right side of head: No submandibular, preauricular or posterior auricular adenopathy.     Left side of head: No submandibular, preauricular or posterior auricular adenopathy.     Cervical: No cervical adenopathy.     Right cervical: No superficial or deep cervical adenopathy.    Left cervical: No superficial or deep cervical adenopathy.  Skin:    General: Skin is warm and dry.     Coloration: Skin is not pale.     Findings: No abrasion, bruising, ecchymosis, erythema, lesion or rash.     Nails: There is no clubbing.  Neurological:     Mental Status: She is alert and oriented to person, place, and time.  Psychiatric:        Attention and Perception: She is attentive.        Mood and Affect: Mood normal.        Speech: Speech normal.        Behavior: Behavior normal. Behavior is cooperative.        Thought Content: Thought content normal.        Judgment: Judgment normal.    Pictures of wounds examined in Media tab  Lab Results Lab Results  Component Value Date   WBC 10.3 05/20/2024   HGB 8.3 (L) 05/20/2024   HCT 27.0 (L) 05/20/2024   MCV 90.6 05/20/2024   PLT 216 05/20/2024    Lab Results  Component Value Date   CREATININE 2.28 (H) 05/20/2024   BUN 56 (H) 05/20/2024   NA 134 (L) 05/20/2024   K 3.9 05/20/2024   CL 106 05/20/2024   CO2 18 (L) 05/20/2024    Lab Results  Component Value Date   ALT 7 04/25/2024   AST 13 (L) 04/25/2024   ALKPHOS 120 04/25/2024   BILITOT 0.8 04/25/2024     Microbiology: Recent Results (from the past 240 hours)  Blood culture (routine x 2)     Status: None (Preliminary result)   Collection Time: 05/19/24  5:27 PM   Specimen: BLOOD RIGHT HAND  Result Value Ref Range Status   Specimen Description  BLOOD RIGHT HAND  Final   Special Requests   Final    BOTTLES DRAWN AEROBIC ONLY Blood Culture results may not be optimal due to an inadequate volume of blood received in culture bottles   Culture   Final    NO GROWTH < 24 HOURS Performed at [facility], [address] [place], [place] [address]    Report Status PENDING  Incomplete  Blood culture (routine x 2)     Status: None (Preliminary result)   Collection Time: 05/19/24  7:01 PM   Specimen: BLOOD  Result Value Ref Range Status   Specimen Description BLOOD LEFT ANTECUBITAL  Final   Special Requests   Final    BOTTLES DRAWN AEROBIC ONLY Blood Culture results may not be optimal due to an inadequate volume of blood received in culture bottles   Culture   Final    NO GROWTH < 12 HOURS Performed at [facility], [address], [place], [place] [address]    Report Status PENDING  Incomplete    Jomarie Fleeta Rothman, MD [facility] [phone] pager  05/20/2024, 3:55 PM

## 2024-05-20 NOTE — Assessment & Plan Note (Addendum)
 Echo in March 2025 with LVEF 35 to 40%.  Appears at baseline volume status.  She does have evidence of venous stasis dermatitis on exam. Some edema though non pitting -Held home metoprolol  and torsemide  given soft pressures.  -Triamcinolone  0.1% ointment to bilateral lower legs for venous stasis dermatitis

## 2024-05-20 NOTE — Evaluation (Signed)
 Physical Therapy Evaluation Patient Details Name: [name] MRN: [id number] DOB: [birth year] Today's Date: 05/20/2024  History of Present Illness  Pt is a 83 yr old female who presented due to a fall and sacral wound. CT showed sacral fx. Pt recently return home from SNF. PMH: CHF, CKD 4, DM II, diabetic neuropathy, GERD, CVA 2018, Afib on eliquis , OSA and thrombocytopenia, macular degeneration, CAD, venous stasis  Clinical Impression  Patient presents with decreased mobility due to weakness, decreased balance, decreased activity tolerance with sacral wound, and history of falls.  Currently needing +2 A for bed mobility and transfers with RW and unable to ambulate.  Previously pt states ambulatory with rollator (though with falls), and assist from her son for ADL's.  States had an aide 3 days a week prior to her rehab stay but not set back up yet since home.  Feel she will benefit from skilled PT in the acute setting and from post-acute inpatient rehab (<3 hours/day) prior to d/c home.       If plan is discharge home, recommend the following: Two people to help with bathing/dressing/bathroom;Two people to help with walking and/or transfers   Can travel by private vehicle   No    Equipment Recommendations None recommended by PT  Recommendations for Other Services       Functional Status Assessment Patient has had a recent decline in their functional status and demonstrates the ability to make significant improvements in function in a reasonable and predictable amount of time.     Precautions / Restrictions Precautions Precautions: Fall Recall of Precautions/Restrictions: Intact      Mobility  Bed Mobility Overal bed mobility: Needs Assistance Bed Mobility: Rolling, Sit to Sidelying, Supine to Sit Rolling: +2 for physical assistance, Max assist   Supine to sit: HOB elevated, Used rails, Mod assist, +2 for safety/equipment   Sit to sidelying: Max assist, +2 for physical  assistance General bed mobility comments: up to EOB from elevated HOB pt able to initiate moving legs off EOB and assist for lifting trunk, scooting hips; to sidelying A for both legs and cues for hand placement on EOB and +2 for rolling, scooting up in bed    Transfers Overall transfer level: Needs assistance Equipment used: Rolling walker (2 wheels) Transfers: Sit to/from Stand Sit to Stand: Max assist, +2 physical assistance           General transfer comment: up from EOB with RW and elevated height lifting A of 2 to stand x 2 trials; pt pulling up on RW    Ambulation/Gait               General Gait Details: unable to progress to ambulation today  Stairs            Wheelchair Mobility     Tilt Bed    Modified Rankin (Stroke Patients Only)       Balance Overall balance assessment: Needs assistance   Sitting balance-Leahy Scale: Fair     Standing balance support: Bilateral upper extremity supported Standing balance-Leahy Scale: Poor Standing balance comment: UE support and +2 A for safety; stood about 5 seconds prior to sitting first trial, seconds less time with pt c/o light headed                             Pertinent Vitals/Pain Pain Assessment Pain Assessment: Faces Faces Pain Scale: Hurts even more Pain Location: bottom/skin Pain Descriptors /  Indicators: Discomfort, Grimacing, Guarding Pain Intervention(s): Monitored during session, Limited activity within patient's tolerance, Repositioned    Home Living Family/patient expects to be discharged to:: Private residence Living Arrangements: Children Available Help at Discharge: Family;Available PRN/intermittently Type of Home: Mobile home Home Access: Ramped entrance       Home Layout: One level Home Equipment: Grab bars - tub/shower;Grab bars - toilet;Hand held shower head;Rollator (4 wheels);BSC/3in1;Lift chair Additional Comments: adjustable bed, has a hand rail    Prior  Function Prior Level of Function : Needs assist             Mobility Comments: Pt reported ambulating around the home with 4WW ADLs Comments: CNA comes for 1 hour x3 a week prior to her rehab stay; Help for bathing/dressing and son helps with cooking and housekeeping.     Extremity/Trunk Assessment   Upper Extremity Assessment Upper Extremity Assessment: Defer to OT evaluation    Lower Extremity Assessment Lower Extremity Assessment: Generalized weakness    Cervical / Trunk Assessment Cervical / Trunk Assessment: Kyphotic  Communication   Communication Communication: No apparent difficulties    Cognition Arousal: Alert Behavior During Therapy: WFL for tasks assessed/performed   PT - Cognitive impairments: No family/caregiver present to determine baseline                         Following commands: Intact       Cueing Cueing Techniques: Verbal cues     General Comments General comments (skin integrity, edema, etc.): BP sitting EOB after stand x 1 104/48, HR 112, SpO2 100% on RA, second stand 106/75    Exercises     Assessment/Plan    PT Assessment Patient needs continued PT services  PT Problem List Decreased strength;Decreased activity tolerance;Decreased balance;Pain;Decreased mobility;Decreased safety awareness       PT Treatment Interventions DME instruction;Patient/family education;Gait training;Functional mobility training;Therapeutic activities;Wheelchair mobility training;Therapeutic exercise;Balance training    PT Goals (Current goals can be found in the Care Plan section)  Acute Rehab PT Goals Patient Stated Goal: return home PT Goal Formulation: With patient Time For Goal Achievement: 06/03/24 Potential to Achieve Goals: Fair    Frequency Min 2X/week     Co-evaluation               AM-PAC PT 6 Clicks Mobility  Outcome Measure Help needed turning from your back to your side while in a flat bed without using bedrails?:  Total Help needed moving from lying on your back to sitting on the side of a flat bed without using bedrails?: Total Help needed moving to and from a bed to a chair (including a wheelchair)?: Total Help needed standing up from a chair using your arms (e.g., wheelchair or bedside chair)?: Total Help needed to walk in hospital room?: Total Help needed climbing 3-5 steps with a railing? : Total 6 Click Score: 6    End of Session Equipment Utilized During Treatment: Gait belt Activity Tolerance: Patient limited by pain;Patient limited by fatigue Patient left: in bed;with call bell/phone within reach;with bed alarm set   PT Visit Diagnosis: Other abnormalities of gait and mobility (R26.89);History of falling (Z91.81);Muscle weakness (generalized) (M62.81);Pain Pain - part of body:  (buttocks)    Time: 8764-8682 PT Time Calculation (min) (ACUTE ONLY): 42 min   Charges:   PT Evaluation $PT Eval Moderate Complexity: 1 Mod PT Treatments $Therapeutic Activity: 23-37 mins PT General Charges $$ ACUTE PT VISIT: 1 Visit  Micheline Portal, PT Acute Rehabilitation Services Office:[phone] 05/20/2024   Montie Portal 05/20/2024, 1:49 PM

## 2024-05-20 NOTE — Progress Notes (Signed)
 Daily Progress Note Intern Pager: [phone]  Patient name: [name] Medical record number: [id number] Date of birth: [birth year] Age: 83 y.o. Gender: female  Primary Care Provider: Tharon Lung, MD Consultants: Ortho, wound care, ID Code Status: Full  Pt Overview and Major Events to Date:  7/9: Admitted  Medical Decision Making: 83 year old female presenting with fall and pre-existing large stage IV sacral ulcer.  CT scan significant for findings of osteomyelitis and sacral fracture. Fall most likely secondary to combination of deconditioning in the setting of prolonged hospitalizations and rehab, and worsened balance and coordination given history of stroke. Assessment & Plan Sacral fracture with ulcer and possible osteomyelitis Approximately 10 cm sacral ulcer, bone appreciated on exam. CT abdomen pelvis without contrast shows focal area of sacral fracture with adjacent air that may represent a traumatic fracture or osteomyelitis.  Reassuringly hemodynamically stable at this time with reassuring vitals and lack of neurological deficits.  Lactic acid downtrending, no concern for sepsis at this time. -Patient initially refused MRI due to severe claustrophobia. Today she agreed to hear about possible sedation options, was also agreeable to bone biopsy as alternative to inform care. With bone visible and findings on CT, will treat empirically as osteomyelitis. - FMTS will admit, attending Dr. Donzetta for antibiotics - Vancomycin  and cefepime  per pharmacy, Flagyl  500 mg every 12 hours for now - Consult ID for antibiotic dosing, consider bone biopsy - Wound care consulted - Tylenol  1000 mg every 6 hours -Home tramadol  50 mg every 8 hours - Dilaudid  1 mg every 4 hours as needed for severe pain - Turn patient every 2 hours for pressure relief, will discuss with nursing for pressure relief bedding - PT and OT - Blood cultures NGTD at 1 day  Fall Patient has had coordination and balance  issues with increasing age and her history of stroke.  Recent hospitalization and rehab stay further compounding picture with profound deconditioning.  Patient fell at home and endorses pain in the left hip that radiates upward.  No bruising of the hip noted on exam.  No acute fractures other than sacral fracture as above noted on x-ray. - Pain control as above - Lidocaine  patch over left hip - PT and OT as above Type 2 diabetes mellitus (HCC) Patient on Trulicity  1.5 mg outpatient.  A1c 7.8 in March. - SSI due to CKD - Ordered updated A1c to help inform wound healing, pending 7/9 CKD (chronic kidney disease), stage III (HCC) Kidney function appears to be worsening on chart review, prior baseline ~1.8 in March 2025.  Creatinine 2.35 7/8, GFR 20.  Mucous membranes dry on exam.  Patient has received roughly 1 L of fluids between bolus and IV antibiotics in the ED. - Creatinine 2.28 7/9 - Encourage p.o. intake - Can consider additional fluid's as needed, caution 2/2 CHF CHF (congestive heart failure) (HCC) Venous stasis dermatitis of both lower extremities Echo in March 2025 with LVEF 35 to 40%.  Appears at baseline volume status.  She does have evidence of venous stasis dermatitis on exam. Some edema though non pitting -Held home metoprolol  and torsemide  given soft pressures.  -Triamcinolone  0.1% ointment to bilateral lower legs for venous stasis dermatitis  FEN/GI: N.p.o. until swallow evaluation, then renal diet with 1200 mL fluid restriction PPx: Home Eliquis  Dispo:SNF pending clinical improvement . Barriers include osteomyelitis, sacral fracture, IV antibiotics.   Subjective:  Patient lying comfortably in bed, receiving right upper quadrant ultrasound during evaluation.  Son at bedside.  Patient reports no acute symptoms, good pain control.  Discussed potential importance of MRI, bone biopsy as alternative.  Patient expressed willingness to consider MRI.  Objective: Temp:  [97.5 F (36.4  C)-98.5 F (36.9 C)] 98.1 F (36.7 C) (07/08 2340) Pulse Rate:  [86-103] 93 (07/08 2137) Resp:  [16-29] 19 (07/08 2340) BP: (95-144)/(54-118) 100/54 (07/09 0323) SpO2:  [99 %-100 %] 99 % (07/08 2137) Weight:  [93.1 kg] 93.1 kg (07/09 0053) Physical Exam: General: Awake, alert, communicating clearly.  No acute distress Cardiovascular: Regular rate and rhythm, no murmurs rubs or gallop Respiratory: Clear to auscultation bilaterally Abdomen: Soft, nontender, nondistended Skin: 10 cm stage IV sacral ulcer, pictures available in patient's chart.  Laboratory: Most recent CBC Lab Results  Component Value Date   WBC 10.1 05/20/2024   HGB 8.0 (L) 05/20/2024   HCT 26.4 (L) 05/20/2024   MCV 92.0 05/20/2024   PLT 233 05/20/2024   Most recent BMP    Latest Ref Rng & Units 05/20/2024    2:10 AM  BMP  Glucose 70 - 99 mg/dL 896   BUN 8 - 23 mg/dL 56   Creatinine 9.55 - 1.00 mg/dL 7.71   Sodium 864 - 854 mmol/L 134   Potassium 3.5 - 5.1 mmol/L 3.9   Chloride 98 - 111 mmol/L 106   CO2 22 - 32 mmol/L 18   Calcium  8.9 - 10.3 mg/dL 8.8    Mg 1.9, lactate 2.5->1.3 on 7/8  Imaging/Diagnostic Tests:  US  Renal:   Right kidney multiple cortical cysts . Previously seen hyperdense/complex subcentimeter right renal cysts on prior CT is not well seen on current exam due to overlying bowel gas. Layering debris/blood products within the bladder lumen. Correlate with urinalysis. Increased echogenicity. Cholelithiasis partially visualized.  CTAP  Deep sacral ulcer, sacral fracture with adjacent air that may represent traumatic fracture or osteomyelitis.  Small bilateral pleural effusions in lower lobes.  Cholelithiasis.  Aortic atherosclerosis.  CT Head w/o No acute intracranial process.  Multiple remote infarcts.  Stable chronic atrophy and white matter disease 1. No acute intracranial abnormality related to the reported head trauma.  L XRAY Hip and Pelvis No acute fracture or dislocation,  sacrum obscured by fecal material.   R XRAY Hip and Pelvis No acute fracture or dislocation, sacrum obscured by fecal material.    Manon Jester, DO 05/20/2024, 8:08 AM  PGY-1, [facility] FPTS Intern pager: [phone], text pages welcome Secure chat group [facility] Teaching Service

## 2024-05-20 NOTE — Assessment & Plan Note (Addendum)
 Patient on Trulicity  1.5 mg outpatient.  A1c 7.8 in March. - SSI due to CKD - Ordered updated A1c to help inform wound healing, pending 7/9

## 2024-05-21 ENCOUNTER — Encounter (HOSPITAL_COMMUNITY): Payer: Self-pay

## 2024-05-21 DIAGNOSIS — M4628 Osteomyelitis of vertebra, sacral and sacrococcygeal region: Secondary | ICD-10-CM | POA: Diagnosis not present

## 2024-05-21 DIAGNOSIS — N179 Acute kidney failure, unspecified: Secondary | ICD-10-CM | POA: Diagnosis not present

## 2024-05-21 DIAGNOSIS — N1831 Chronic kidney disease, stage 3a: Secondary | ICD-10-CM | POA: Diagnosis not present

## 2024-05-21 DIAGNOSIS — L89154 Pressure ulcer of sacral region, stage 4: Secondary | ICD-10-CM | POA: Diagnosis not present

## 2024-05-21 LAB — CBC
HCT: 23.5 % — ABNORMAL LOW (ref 36.0–46.0)
Hemoglobin: 7.4 g/dL — ABNORMAL LOW (ref 12.0–15.0)
MCH: 27.8 pg (ref 26.0–34.0)
MCHC: 31.5 g/dL (ref 30.0–36.0)
MCV: 88.3 fL (ref 80.0–100.0)
Platelets: 235 K/uL (ref 150–400)
RBC: 2.66 MIL/uL — ABNORMAL LOW (ref 3.87–5.11)
RDW: 21.2 % — ABNORMAL HIGH (ref 11.5–15.5)
WBC: 6.9 K/uL (ref 4.0–10.5)
nRBC: 0 % (ref 0.0–0.2)

## 2024-05-21 LAB — GLUCOSE, CAPILLARY
Glucose-Capillary: 118 mg/dL — ABNORMAL HIGH (ref 70–99)
Glucose-Capillary: 95 mg/dL (ref 70–99)
Glucose-Capillary: 218 mg/dL — ABNORMAL HIGH (ref 70–99)
Glucose-Capillary: 116 mg/dL — ABNORMAL HIGH (ref 70–99)

## 2024-05-21 LAB — BASIC METABOLIC PANEL WITH GFR
Anion gap: 9 (ref 5–15)
BUN: 57 mg/dL — ABNORMAL HIGH (ref 8–23)
CO2: 18 mmol/L — ABNORMAL LOW (ref 22–32)
Calcium: 9.3 mg/dL (ref 8.9–10.3)
Chloride: 106 mmol/L (ref 98–111)
Creatinine, Ser: 2.39 mg/dL — ABNORMAL HIGH (ref 0.44–1.00)
GFR, Estimated: 19 mL/min — ABNORMAL LOW (ref >60)
Glucose, Bld: 123 mg/dL — ABNORMAL HIGH (ref 70–99)
Potassium: 4.1 mmol/L (ref 3.5–5.1)
Sodium: 133 mmol/L — ABNORMAL LOW (ref 135–145)

## 2024-05-21 LAB — MAGNESIUM: Magnesium: 2 mg/dL (ref 1.7–2.4)

## 2024-05-21 NOTE — Assessment & Plan Note (Addendum)
 Echo in March 2025 with LVEF 35 to 40%.  Appears at baseline volume status.  She does have evidence of venous stasis dermatitis on exam. Some edema though non pitting -Held home metoprolol  and torsemide  given soft pressures.  -Triamcinolone  0.1% ointment to bilateral lower legs for venous stasis dermatitis

## 2024-05-21 NOTE — Assessment & Plan Note (Signed)
 Approximately 10 cm sacral ulcer, bone appreciated on exam. CT abdomen pelvis without contrast shows focal area of sacral fracture with adjacent air that may represent a traumatic fracture or osteomyelitis.  Reassuringly hemodynamically stable at this time with reassuring vitals and lack of neurological deficits.  Lactic acid downtrending, no concern for sepsis at this time. -Patient successfully underwent pelvic MRI 7/9, confirming diagnosis of osteomyelitis.  -ID recommended transition to Unasyn  with transition to PO augmentin  pending patient's clinical improvement.  - FMTS will admit, attending Dr. Donzetta for antibiotics - Wound care consulted - Tylenol  1000 mg every 6 hours -Home tramadol  50 mg every 8 hours - Dilaudid  1 mg every 4 hours as needed for severe pain - Turn patient every 2 hours for pressure relief, will discuss with nursing for pressure relief bedding - PT and OT - Blood cultures NGTD at 2 day

## 2024-05-21 NOTE — Assessment & Plan Note (Deleted)
 Approximately 10 cm sacral ulcer, bone appreciated on exam. CT abdomen pelvis without contrast shows focal area of sacral fracture with adjacent air that may represent a traumatic fracture or osteomyelitis.  Reassuringly hemodynamically stable at this time with reassuring vitals and lack of neurological deficits.  Lactic acid downtrending, no concern for sepsis at this time. -Patient successfully underwent pelvic MRI 7/9, confirming diagnosis of osteomyelitis.  -ID recommended transition to Unasyn  with transition to PO augmentin  pending patient's clinical improvement.  - FMTS will admit, attending Dr. Donzetta for antibiotics - Vancomycin  and cefepime  per pharmacy, Flagyl  500 mg every 12 hours for now - Consult ID for antibiotic dosing, consider bone biopsy - Wound care consulted - Tylenol  1000 mg every 6 hours -Home tramadol  50 mg every 8 hours - Dilaudid  1 mg every 4 hours as needed for severe pain - Turn patient every 2 hours for pressure relief, will discuss with nursing for pressure relief bedding - PT and OT - Blood cultures NGTD at 2 day

## 2024-05-21 NOTE — Assessment & Plan Note (Signed)
 Patient on Trulicity  1.5 mg outpatient.  A1c 7.8 in March. - SSI due to CKD

## 2024-05-21 NOTE — Plan of Care (Signed)
 x4 attempts to reach on call provider Dr. Sharl regarding consult for ulcer debridement across the afternoon. Dr. Sharl secure chat that sacral ulcer is not within their scope of care. Will reach out to Gen Surg.

## 2024-05-21 NOTE — Assessment & Plan Note (Addendum)
 Kidney function appears to be worsening on chart review, prior baseline ~1.8 in March 2025.  Creatinine 2.35 7/8, GFR 20.  Mucous membranes dry on exam.  Patient has received roughly 1 L of fluids between bolus and IV antibiotics in the ED. - Creatinine 2.39 7/10, 2.28 7/9. - Encourage p.o. intake - Can consider additional fluid's as needed, caution 2/2 CHF

## 2024-05-21 NOTE — Progress Notes (Signed)
 Daily Progress Note Intern Pager: [phone]  Patient name: [name] Medical record number: [id number] Date of birth: Jul 14, 1941 Age: 83 y.o. Gender: female  Primary Care Provider: Tharon Lung, MD Consultants: Ortho, wound care, ID Code Status: Full  Pt Overview and Major Events to Date:  7/9: Admitted  Medical Decision Making: 83 year old female presenting with fall and pre-existing large stage IV sacral ulcer.  CT scan significant for findings of osteomyelitis and sacral fracture. Fall most likely secondary to combination of deconditioning in the setting of prolonged hospitalizations and rehab, and worsened balance and coordination given history of stroke.  Assessment & Plan Fall Patient has had coordination and balance issues with increasing age and her history of stroke.  Recent hospitalization and rehab stay further compounding picture with profound deconditioning.  Patient fell at home and endorses pain in the left hip that radiates upward.  No bruising of the hip noted on exam.  No acute fractures other than sacral fracture as above noted on x-ray. - Pain control as above - Lidocaine  patch over left hip - PT and OT as above   Sacral osteomyelitis (HCC) Approximately 10 cm sacral ulcer, bone appreciated on exam. CT abdomen pelvis without contrast shows focal area of sacral fracture with adjacent air that may represent a traumatic fracture or osteomyelitis.  Reassuringly hemodynamically stable at this time with reassuring vitals and lack of neurological deficits.  Lactic acid downtrending, no concern for sepsis at this time. -Patient successfully underwent pelvic MRI 7/9, confirming diagnosis of osteomyelitis.  -ID recommended transition to Unasyn  with transition to PO augmentin  pending patient's clinical improvement.  - FMTS will admit, attending Dr. Donzetta for antibiotics - Wound care consulted - Tylenol  1000 mg every 6 hours -Home tramadol  50 mg every 8 hours -  Dilaudid  1 mg every 4 hours as needed for severe pain - Turn patient every 2 hours for pressure relief, will discuss with nursing for pressure relief bedding - PT and OT - Blood cultures NGTD at 2 day Type 2 diabetes mellitus (HCC) Patient on Trulicity  1.5 mg outpatient.  A1c 7.8 in March. - SSI due to CKD  Acute kidney injury superimposed on stage 3a chronic kidney disease (HCC) Kidney function appears to be worsening on chart review, prior baseline ~1.8 in March 2025.  Creatinine 2.35 7/8, GFR 20.  Mucous membranes dry on exam.  Patient has received roughly 1 L of fluids between bolus and IV antibiotics in the ED. - Creatinine 2.39 7/10, 2.28 7/9. - Encourage p.o. intake - Can consider additional fluid's as needed, caution 2/2 CHF CHF (congestive heart failure) (HCC) Venous stasis dermatitis of both lower extremities Echo in March 2025 with LVEF 35 to 40%.  Appears at baseline volume status.  She does have evidence of venous stasis dermatitis on exam. Some edema though non pitting -Held home metoprolol  and torsemide  given soft pressures.  -Triamcinolone  0.1% ointment to bilateral lower legs for venous stasis dermatitis   FEN/GI: Renal diet with fluid restriction PPx: Home eliquis  Dispo:SNF pending clinical improvement . Barriers include osteomyelitis, transition to oral antibiotics.   Subjective:  Patient reports some pain in her backside overnight but is otherwise doing well this AM. She reports getting through her MRI ok yesterday. No concerns this AM. Son at bedside.   Objective: Temp:  [97.8 F (36.6 C)-98.8 F (37.1 C)] (P) 97.8 F (36.6 C) (07/10 0758) Pulse Rate:  [72-100] 89 (07/10 1105) Resp:  [18-20] 19 (07/10 0352) BP: (93-115)/(24-94)  98/66 (07/10 1105) SpO2:  [87 %-100 %] (P) 100 % (07/10 0758) Physical Exam: General: Awake, alert, no acute distress.  Communicating clearly. Cardiovascular: Regular rate and rhythm, no murmurs rubs or gallops Respiratory:  Clear to auscultation bilaterally Abdomen: Soft, nondistended, nontender Skin: 10 cm stafe IV sacral ulcer Neuro: Aox4, no focal deficits.   Laboratory: Most recent CBC Lab Results  Component Value Date   WBC 6.9 05/21/2024   HGB 7.4 (L) 05/21/2024   HCT 23.5 (L) 05/21/2024   MCV 88.3 05/21/2024   PLT 235 05/21/2024   Most recent BMP    Latest Ref Rng & Units 05/20/2024    2:10 AM  BMP  Glucose 70 - 99 mg/dL 896   BUN 8 - 23 mg/dL 56   Creatinine 9.55 - 1.00 mg/dL 7.71   Sodium 864 - 854 mmol/L 134   Potassium 3.5 - 5.1 mmol/L 3.9   Chloride 98 - 111 mmol/L 106   CO2 22 - 32 mmol/L 18   Calcium  8.9 - 10.3 mg/dL 8.8    Imaging/Diagnostic Tests: Renal US : Right kidney multiple cortical cysts . Previously seen hyperdense/complex subcentimeter right renal cysts on prior CT is not well seen on current exam due to overlying bowel gas.   Layering debris/blood products within the bladder lumen. Correlate with urinalysis. Increased echogenicity   Cholelithiasis partially visualized.  MRI Pelvis: 1. Large sacral decubitus ulcer just to the left of midline extending to the upper coccygeal segment where there is active osteomyelitis. 2. Accentuated T2 signal in the intervertebral disc at the L3-4, L4-5, and L5-S1 levels although I am skeptical of discitis-osteomyelitis based on the lack of bony destructive findings along the endplates. Please note that the lumbar spine was not assessed in a comprehensive manner on today's pelvic MRI. 3. Mild bilateral trochanteric bursitis, left greater than right. 4. Partial tearing of both hamstring tendons proximally. 5. Fluid-fluid level in the urinary bladder suggesting either blood products debris. Correlate with urine analysis. 6. Mild diffuse subcutaneous edema likely from third spacing of fluids. 7. Dextroconvex lumbar scoliosis with chronic compression fracture at L3.   Manon Jester, DO 05/21/2024, 11:57 AM  PGY-1, [facility] FPTS Intern pager: [phone], text pages welcome Secure chat group [facility] Teaching Service

## 2024-05-21 NOTE — Plan of Care (Signed)

## 2024-05-21 NOTE — Consult Note (Signed)
 Consult Note  [name] May 29, 1941  [id number].    Requesting MD: Krystal Bale, MD Chief Complaint/Reason for Consult: sacral wound HPI:  Patient is an 83 year old female who is currently admitted from SNF after fall with sacral decubitus pressure ulcer. Found to have osteomyelitis of sacrum and sacral fracture. ID managing antibiotics. Patient reports she does have an air mattress at SNF. Unclear what wound care was at [facility] PMH significant for hx of CVA, deconditioning, T2DM, AKI on CKD3a, CHF, venous stasis of BLE. Currently on Eliquis . Patient confirmed she is primarily bed bound.   ROS: Negative other than HPI.   Family History  Problem Relation Age of Onset   Clotting disorder Mother        Cerebral hemorrhage   Other Mother        cerebral hemorrhage   Emphysema Father        COD   Lung cancer Brother    Heart disease Neg Hx     Past Medical History:  Diagnosis Date   Acute cystitis with hematuria 09/01/2020   ANXIETY 01/31/2009   Qualifier: Diagnosis of  By: Leana, CNA, Christy     Arthritis    qwhere (03/30/2016)   Bilateral carpal tunnel syndrome 02/13/2021   CAD (coronary artery disease) 05/2008   a. s/p CABG in 2009.   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 Postoperative in 2009;  S/P transesophageal echocardiography-guided cardioversion, previously on Amiodarone  and Coumadin. b. recurrent in April 2013 -> pt elected rate control strategy, initially Coumadin -> had stroke with subtherapeutic INR in 03/2016 and transitioned to Eliquis .   Macular degeneration    Melena 01/10/2021   Morbid obesity (HCC) 03/26/2017   OSA on CPAP    severe OSA with AHI 34/hr   Proliferative diabetic retinopathy of right eye (HCC) 04/17/2021   Found today at delivery of PRP for what was thought to be severe NPDR OD   Rectal bleeding    Thrombocytopenia (HCC)     Past Surgical History:  Procedure Laterality Date   APPENDECTOMY  1946   BACK SURGERY     CARPAL TUNNEL RELEASE Left 03/28/2021   Procedure: LEFT CARPAL TUNNEL RELEASE;  Surgeon: Murrell Kuba, MD;  Location: [facility] OR;  Service: Orthopedics;  Laterality: Left;  60 MIN   CARPAL TUNNEL RELEASE Right 09/15/2021   Procedure: RIGHT CARPAL TUNNEL RELEASE;  Surgeon: Murrell Kuba, MD;  Location: [facility] OR;  Service: Orthopedics;  Laterality: Right;   CATARACT EXTRACTION Right 2012   COLONOSCOPY WITH PROPOFOL  Left 03/27/2017   Procedure: COLONOSCOPY WITH PROPOFOL ;  Surgeon: Saintclair Jasper, MD;  Location: [facility] ENDOSCOPY;  Service: Gastroenterology;  Laterality: Left;  CORONARY ANGIOPLASTY WITH STENT PLACEMENT  2009   put 2 stents in   EXCISION METACARPAL MASS Left 03/28/2021   Procedure: EXCISION MASS LEFT THUMB;  Surgeon: Murrell Kuba, MD;  Location: [facility] OR;  Service: Orthopedics;  Laterality: Left;   EYE SURGERY     bilateral cataract   HERNIA REPAIR     IR LUMBAR DISC ASPIRATION W/IMG GUIDE  10/08/2018   JOINT REPLACEMENT     TEE WITH CARDIOVERSION     Postoperative in 2009;  S/P transesophageal echocardiography-guided cardioversion,   TOTAL KNEE ARTHROPLASTY Right 1994   VENTRAL HERNIA REPAIR  10/1999   With mesh    Social History:  reports that  she has never smoked. She has never been exposed to tobacco smoke. She has never used smokeless tobacco. She reports that she does not drink alcohol and does not use drugs.  Allergies:  Allergies  Allergen Reactions   Benazepril Other (See Comments)    Unknown reaction at age 35-65 - possibly dizziness   Cymbalta  [Duloxetine  Hcl] Other (See Comments)    Itch, Dry mouth    Ozempic  (0.25 Or 0.5 Mg-Dose) [Semaglutide (0.25 Or 0.5mg -Dos)] Diarrhea    Gi intollerance   Tape Other (See Comments)    TOLERATES PAPER TAPE ONLY- due to thin skin. NOT ALLERGY PER PT   Angiotensin Receptor Blockers Other (See Comments)    Hypotension reaction   Bactrim [Sulfamethoxazole-Trimethoprim] Other (See Comments)    Unknown reaction   Fe-Succ-C-Thre-B12-Des Stomach Other (See Comments)    Unknown reaction  Ferocon?    Medications Prior to Admission  Medication Sig Dispense Refill   acetaminophen  (TYLENOL  8 HOUR) 650 MG CR tablet Take 1 tablet (650 mg total) by mouth every 8 (eight) hours as needed for pain. 30 tablet 0   amiodarone  (PACERONE ) 200 MG tablet Take 1 tablet (200 mg total) by mouth daily.     apixaban  (ELIQUIS ) 2.5 MG TABS tablet Take 1 tablet (2.5 mg total) by mouth 2 (two) times daily. 180 tablet 3   atorvastatin  (LIPITOR) 40 MG tablet Take 1 tablet (40 mg total) by mouth daily. 100 tablet 2   brimonidine  (ALPHAGAN ) 0.2 % ophthalmic solution Place 1 drop into both eyes 2 (two) times daily.     Dulaglutide  (TRULICITY ) 1.5 MG/0.5ML SOAJ Inject 1.5 mg into the skin once a week. 2 mL 0   lidocaine  (LIDODERM ) 5 % Place 1 patch onto the skin daily. Remove & Discard patch within 12 hours or as directed by MD (Patient taking differently: Place 1 patch onto the skin daily as needed (For pain).) 30 patch 0   megestrol  (MEGACE ) 40 MG tablet Take 1 tablet (40 mg total) by mouth daily. 30 tablet 0   metoprolol  succinate (TOPROL -XL) 25 MG 24 hr tablet Take 12.5 mg by mouth daily.     Multiple  Vitamins-Minerals (PRESERVISION AREDS 2+MULTI VIT PO) Take 1 capsule by mouth in the morning and at bedtime.     nitroGLYCERIN  (NITROSTAT ) 0.4 MG SL tablet Place 1 tablet (0.4 mg total) under the tongue every 5 (five) minutes as needed for chest pain (up to 3 doses). 15 tablet 5   potassium chloride  SA (KLOR-CON  M) 20 MEQ tablet Take 1 tablet (20 mEq total) by mouth daily.     torsemide  (DEMADEX ) 20 MG tablet Take 40 mg by mouth daily.     traMADol  (ULTRAM ) 50 MG tablet Take 1 tablet (50 mg total) by mouth every 12 (twelve) hours as needed for severe pain (pain  score 7-10) (Can take every 6 hours for breakthrough pain.). 60 tablet 1   zinc  oxide 20 % ointment Apply 1 application topically as needed (buttocks irritation). 425 g 1   ferrous sulfate  325 (65 FE) MG tablet TAKE 1 TABLET BY MOUTH EVERY DAY WITH BREAKFAST (Patient not taking: Reported on 05/20/2024) 90 tablet 1   glucose blood (ONE TOUCH ULTRA TEST) test strip Use three times a day 100 each 3   glucose blood test strip Use as instructed to check blood glucose up to 4x daily 100 each 12   Lancets (ONETOUCH DELICA PLUS LANCET33G) MISC Please use to check blood sugar up to four times daily. 100 each 6   metolazone  (ZAROXOLYN ) 2.5 MG tablet Take 1 tablet (2.5 mg total) by mouth daily. (Patient not taking: Reported on 03/25/2024) 5 tablet 1   rOPINIRole  (REQUIP ) 0.5 MG tablet Take 2 tablets (1 mg total) by mouth at bedtime. (Patient not taking: Reported on 05/20/2024) 180 tablet 3    Blood pressure 98/66, pulse 89, temperature (P) 97.8 F (36.6 C), temperature source (P) Oral, resp. rate 19, height 5' 7 (1.702 m), weight 93.1 kg, SpO2 (P) 100%. Physical Exam:  General: pleasant, WD, chronically ill appearing elderly female who is laying in bed in NAD HEENT: head is normocephalic, atraumatic.  Sclera are noninjected.  Ears and nose without any masses or lesions.  Mouth is pink and moist Heart: regular, rate, and rhythm.   Lungs: No wheezes,  rhonchi, or rales noted.  Respiratory effort nonlabored Abd: soft, NT, ND GU: sacral wound as pictured below with some fibrin and minimal necrotic tissue in wound base but largely clean, palpable bone, no undrained fluid pockets on exploration of wound  MS: changes of chronic venous stasis to BLE Psych: A&Ox3 with an appropriate affect.   Results for orders placed or performed during the hospital encounter of 05/19/24 (from the past 48 hours)  Sedimentation rate     Status: Abnormal   Collection Time: 05/19/24  5:25 PM  Result Value Ref Range   Sed Rate 54 (H) 0 - 22 mm/hr    Comment: Performed at [facility], [address], [place], [place] [address]  C-reactive protein     Status: Abnormal   Collection Time: 05/19/24  5:25 PM  Result Value Ref Range   CRP 16.4 (H) <1.0 mg/dL    Comment: Performed at [facility], [address] [place], [place] [address]  Blood culture (routine x 2)     Status: None (Preliminary result)   Collection Time: 05/19/24  5:27 PM   Specimen: BLOOD RIGHT HAND  Result Value Ref Range   Specimen Description BLOOD RIGHT HAND    Special Requests      BOTTLES DRAWN AEROBIC ONLY Blood Culture results may not be optimal due to an inadequate volume of blood received in culture bottles   Culture      NO GROWTH 2 DAYS Performed at [facility], [address] [place], [place] [address]    Report Status PENDING   I-Stat CG4 Lactic Acid     Status: Abnormal   Collection Time: 05/19/24  5:32 PM  Result Value Ref Range   Lactic Acid, Venous 2.5 (HH) 0.5 - 1.9 mmol/L   Comment NOTIFIED PHYSICIAN   Blood culture (routine x 2)     Status: None (Preliminary result)   Collection Time: 05/19/24  7:01 PM   Specimen: BLOOD  Result Value Ref Range  Specimen Description BLOOD LEFT ANTECUBITAL    Special Requests      BOTTLES DRAWN AEROBIC ONLY Blood Culture results may not be optimal due to an inadequate volume of blood received in culture bottles    Culture      NO GROWTH 2 DAYS Performed at [facility], [address] [place], [place] [address]    Report Status PENDING   I-Stat CG4 Lactic Acid     Status: None   Collection Time: 05/19/24  7:02 PM  Result Value Ref Range   Lactic Acid, Venous 1.3 0.5 - 1.9 mmol/L  Glucose, capillary     Status: Abnormal   Collection Time: 05/19/24  9:44 PM  Result Value Ref Range   Glucose-Capillary 117 (H) 70 - 99 mg/dL    Comment: Glucose reference range applies only to samples taken after fasting for at least 8 hours.  Basic metabolic panel     Status: Abnormal   Collection Time: 05/20/24  2:10 AM  Result Value Ref Range   Sodium 134 (L) 135 - 145 mmol/L   Potassium 3.9 3.5 - 5.1 mmol/L   Chloride 106 98 - 111 mmol/L   CO2 18 (L) 22 - 32 mmol/L   Glucose, Bld 103 (H) 70 - 99 mg/dL    Comment: Glucose reference range applies only to samples taken after fasting for at least 8 hours.   BUN 56 (H) 8 - 23 mg/dL   Creatinine, Ser 7.71 (H) 0.44 - 1.00 mg/dL   Calcium  8.8 (L) 8.9 - 10.3 mg/dL   GFR, Estimated 21 (L) >60 mL/min    Comment: (NOTE) Calculated using the CKD-EPI Creatinine Equation (2021)    Anion gap 10 5 - 15    Comment: Performed at [facility], [address] [place], [place] [address]  CBC     Status: Abnormal   Collection Time: 05/20/24  2:10 AM  Result Value Ref Range   WBC 10.1 4.0 - 10.5 K/uL   RBC 2.87 (L) 3.87 - 5.11 MIL/uL   Hemoglobin 8.0 (L) 12.0 - 15.0 g/dL   HCT 73.5 (L) 63.9 - 53.9 %   MCV 92.0 80.0 - 100.0 fL   MCH 27.9 26.0 - 34.0 pg   MCHC 30.3 30.0 - 36.0 g/dL   RDW 78.5 (H) 88.4 - 84.4 %   Platelets 233 150 - 400 K/uL   nRBC 0.0 0.0 - 0.2 %    Comment: Performed at [facility], [address], [place], [place] [address]  Magnesium      Status: None   Collection Time: 05/20/24  2:10 AM  Result Value Ref Range   Magnesium  1.9 1.7 - 2.4 mg/dL    Comment: Performed at [facility], [address], [place], [place] [address]   Glucose, capillary     Status: None   Collection Time: 05/20/24  8:11 AM  Result Value Ref Range   Glucose-Capillary 91 70 - 99 mg/dL    Comment: Glucose reference range applies only to samples taken after fasting for at least 8 hours.  CBC     Status: Abnormal   Collection Time: 05/20/24  9:26 AM  Result Value Ref Range   WBC 10.3 4.0 - 10.5 K/uL   RBC 2.98 (L) 3.87 - 5.11 MIL/uL   Hemoglobin 8.3 (L) 12.0 - 15.0 g/dL   HCT 72.9 (L) 63.9 - 53.9 %   MCV 90.6 80.0 - 100.0 fL   MCH 27.9  26.0 - 34.0 pg   MCHC 30.7 30.0 - 36.0 g/dL   RDW 78.5 (H) 88.4 - 84.4 %   Platelets 216 150 - 400 K/uL   nRBC 0.0 0.0 - 0.2 %    Comment: Performed at [facility], [address] [place], [place] [address]  Glucose, capillary     Status: None   Collection Time: 05/20/24 11:36 AM  Result Value Ref Range   Glucose-Capillary 92 70 - 99 mg/dL    Comment: Glucose reference range applies only to samples taken after fasting for at least 8 hours.  Glucose, capillary     Status: Abnormal   Collection Time: 05/20/24  4:25 PM  Result Value Ref Range   Glucose-Capillary 103 (H) 70 - 99 mg/dL    Comment: Glucose reference range applies only to samples taken after fasting for at least 8 hours.  Glucose, capillary     Status: Abnormal   Collection Time: 05/20/24  7:42 PM  Result Value Ref Range   Glucose-Capillary 130 (H) 70 - 99 mg/dL    Comment: Glucose reference range applies only to samples taken after fasting for at least 8 hours.  Glucose, capillary     Status: Abnormal   Collection Time: 05/21/24  8:13 AM  Result Value Ref Range   Glucose-Capillary 118 (H) 70 - 99 mg/dL    Comment: Glucose reference range applies only to samples taken after fasting for at least 8 hours.  Basic metabolic panel with GFR     Status: Abnormal   Collection Time: 05/21/24  9:07 AM  Result Value Ref Range   Sodium 133 (L) 135 - 145 mmol/L   Potassium 4.1 3.5 - 5.1 mmol/L   Chloride 106 98 - 111 mmol/L   CO2 18  (L) 22 - 32 mmol/L   Glucose, Bld 123 (H) 70 - 99 mg/dL    Comment: Glucose reference range applies only to samples taken after fasting for at least 8 hours.   BUN 57 (H) 8 - 23 mg/dL   Creatinine, Ser 7.60 (H) 0.44 - 1.00 mg/dL   Calcium  9.3 8.9 - 10.3 mg/dL   GFR, Estimated 19 (L) >60 mL/min    Comment: (NOTE) Calculated using the CKD-EPI Creatinine Equation (2021)    Anion gap 9 5 - 15    Comment: Performed at [facility], [address] [place], [place] [address]  Magnesium      Status: None   Collection Time: 05/21/24  9:07 AM  Result Value Ref Range   Magnesium  2.0 1.7 - 2.4 mg/dL    Comment: Performed at [facility], [address] [place], [place] [address]  CBC     Status: Abnormal   Collection Time: 05/21/24  9:07 AM  Result Value Ref Range   WBC 6.9 4.0 - 10.5 K/uL   RBC 2.66 (L) 3.87 - 5.11 MIL/uL   Hemoglobin 7.4 (L) 12.0 - 15.0 g/dL   HCT 76.4 (L) 63.9 - 53.9 %   MCV 88.3 80.0 - 100.0 fL   MCH 27.8 26.0 - 34.0 pg   MCHC 31.5 30.0 - 36.0 g/dL   RDW 78.7 (H) 88.4 - 84.4 %   Platelets 235 150 - 400 K/uL   nRBC 0.0 0.0 - 0.2 %    Comment: Performed at [facility], [address] [place], [place] [address]  Glucose, capillary     Status: None   Collection Time: 05/21/24 11:22 AM  Result  Value Ref Range   Glucose-Capillary 95 70 - 99 mg/dL    Comment: Glucose reference range applies only to samples taken after fasting for at least 8 hours.   MR PELVIS WO CONTRAST Result Date: 05/21/2024 CLINICAL DATA:  Pelvic osteomyelitis EXAM: MRI PELVIS WITHOUT CONTRAST TECHNIQUE: Multiplanar multisequence MR imaging of the pelvis was performed. No intravenous contrast was administered. COMPARISON:  CT pelvis 05/19/2024 FINDINGS: OSSEOUS STRUCTURES AND JOINTS: Dextroconvex lumbar scoliosis with chronic compression fracture at L3. Accentuated T2 signal in the intervertebral disc at the L3-4, L4-5, and L5-S1 levels although I am skeptical of discitis-osteomyelitis  based on the lack of bony destructive findings along the endplates. Please note that the lumbar spine was not assessed in a comprehensive manner on today's pelvic MRI Upper coccygeal osteomyelitis noted with disruption of cortical margins in the upper coccygeal segment just below the deep decubitus ulcer, as shown on images 38-41 of series 8. Mild spurring of both acetabula and of the right femoral head. SI joints unremarkable. MUSCULOTENDINOUS: Low-level generalized edema tracking along regional musculature, along with substantial regional muscular atrophy. No intramuscular abscess observed. SACRAL PLEXUS: No impinging lesion is identified involving the sacral plexus or proximal sciatic nerves. OTHER: Large sacral decubitus ulcer just to the left of midline extending to the upper coccygeal segment where there is osteomyelitis. Mild presacral edema. Mild bilateral trochanteric bursitis, left greater than right. Partial tearing of both hamstring tendons proximally. There is a fluid-fluid level in the urinary bladder suggesting either blood products or settled debris. Correlate with urine analysis. Mild diffuse subcutaneous edema likely from third spacing of fluids. No pelvic ascites noted. IMPRESSION: 1. Large sacral decubitus ulcer just to the left of midline extending to the upper coccygeal segment where there is active osteomyelitis. 2. Accentuated T2 signal in the intervertebral disc at the L3-4, L4-5, and L5-S1 levels although I am skeptical of discitis-osteomyelitis based on the lack of bony destructive findings along the endplates. Please note that the lumbar spine was not assessed in a comprehensive manner on today's pelvic MRI. 3. Mild bilateral trochanteric bursitis, left greater than right. 4. Partial tearing of both hamstring tendons proximally. 5. Fluid-fluid level in the urinary bladder suggesting either blood products debris. Correlate with urine analysis. 6. Mild diffuse subcutaneous edema likely from  third spacing of fluids. 7. Dextroconvex lumbar scoliosis with chronic compression fracture at L3. Electronically Signed   By: Ryan Salvage M.D.   On: 05/21/2024 07:51   US  RENAL Result Date: 05/20/2024 CLINICAL DATA:  Acute kidney injury EXAM: RENAL / URINARY TRACT ULTRASOUND COMPLETE COMPARISON:  CT abdomen pelvis May 19, 2024 FINDINGS: Exam is limited due to body habitus and overlying bowel gas and patient's mobility. Right Kidney: Renal measurements: 11.1 x 5.6 x 5 cm = volume: 161.43 mL. Echogenicity within normal limits. Multiple simple appearing renal cysts are identified measuring up to 4 cm. Echogenic tubular structures consistent with calcified renal vessels. No nephrolithiasis or hydronephrosis. No perinephric fluid collection. Left Kidney: Partially obscured by bowel gas. Renal measurements: 9 x 5.8 x 5.4 cm = volume: 148.58 mL. Echogenicity within normal limits. No mass or hydronephrosis visualized. No perinephric fluid collection. No nephrolithiasis. Bladder: Floating layering/debris is identified intraluminal. Otherwise bladder appears exam is limited due normal for degree of bladder distention. Other: Cholelithiasis is partially visualized. IMPRESSION: Right kidney multiple cortical cysts . Previously seen hyperdense/complex subcentimeter right renal cysts on prior CT is not well seen on current exam due to overlying bowel gas. Layering debris/blood products  within the bladder lumen. Correlate with urinalysis. Increased echogenicity Cholelithiasis partially visualized. Electronically Signed   By: Megan  Zare M.D.   On: 05/20/2024 12:30   CT ABDOMEN PELVIS WO CONTRAST Result Date: 05/19/2024 CLINICAL DATA:  Fall.  Sacral pain.  Sacral ulceration. EXAM: CT ABDOMEN AND PELVIS WITHOUT CONTRAST TECHNIQUE: Multidetector CT imaging of the abdomen and pelvis was performed following the standard protocol without IV contrast. RADIATION DOSE REDUCTION: This exam was performed according to the  departmental dose-optimization program which includes automated exposure control, adjustment of the mA and/or kV according to patient size and/or use of iterative reconstruction technique. COMPARISON:  CT abdomen pelvis dated 07/04/2018. FINDINGS: Evaluation of this exam is limited in the absence of intravenous contrast. Lower chest: Partially visualized small bilateral pleural effusions with partial compressive atelectasis of the lower lobes. Pneumonia is not excluded. There is coronary vascular calcification. No intra-abdominal free air or free fluid. Hepatobiliary: The liver is unremarkable. No biliary ductal dilatation. Gallstones. No pericholecystic fluid or evidence of acute cholecystitis by CT. Pancreas: Unremarkable. No pancreatic ductal dilatation or surrounding inflammatory changes. Spleen: Normal in size without focal abnormality. Adrenals/Urinary Tract: The adrenal glands unremarkable. Multiple bilateral renal cysts and additional small high attenuating lesions which are not characterized on this noncontrast CT, possibly proteinaceous or hemorrhagic cysts. These can be better evaluated with ultrasound or MRI on a nonemergent/outpatient basis. There is extensive renal vascular calcification. No hydronephrosis or obstructing stone. Stomach/Bowel: There is moderate amount of stool throughout the colon. There is no bowel obstruction or active inflammation. The appendix is not visualized with certainty. No inflammatory changes identified in the right lower quadrant. Vascular/Lymphatic: Moderate aortoiliac atherosclerotic disease. The IVC is unremarkable. No portal venous gas. There is no adenopathy. Reproductive: The uterus is grossly unremarkable. No suspicious adnexal masses. Other: Mild diffuse subcutaneous edema. Musculoskeletal: There is osteopenia with degenerative changes of the spine. Deep sacral ulcer extending to the level of the distal sacrum. Focal area of sacral fracture and fragmentation with  adjacent air may represent a traumatic fracture or osteomyelitis (sagittal 85/7). No other acute osseous pathology. No drainable fluid collection or abscess. IMPRESSION: 1. Deep sacral ulcer extending to the level of the distal sacrum. Focal area of sacral fracture and fragmentation with adjacent air may represent a traumatic fracture or osteomyelitis. No drainable fluid collection or abscess. 2. Partially visualized small bilateral pleural effusions with partial compressive atelectasis of the lower lobes. 3. Cholelithiasis. 4.  Aortic Atherosclerosis (ICD10-I70.0). Electronically Signed   By: Vanetta Chou M.D.   On: 05/19/2024 18:21      Assessment/Plan Stage IV Sacral decubitus pressure ulcer with osteomyelitis  - wound evaluated and does not meet indications for surgical debridement - agree with Dakin's per WOC RN recommendations and then can switch to saline WTD BID or PRN if soiled - abx per ID - recommend referral to wound care center on discharge - general surgery will sign off   I reviewed nursing notes, Consultant ID notes, hospitalist notes, last 24 h vitals and pain scores, last 48 h intake and output, last 24 h labs and trends, and last 24 h imaging results.  This care required moderate level of medical decision making.   Burnard JONELLE Louder, [facility] 05/21/2024, 3:10 PM Please see Amion for pager number during day hours 7:00am-4:30pm

## 2024-05-21 NOTE — Plan of Care (Signed)
 Problem: Education: Goal: Knowledge of General Education information will improve Description: Including pain rating scale, medication(s)/side effects and non-pharmacologic comfort measures 05/21/2024 2333 by Honora Frederick PARAS, RN Outcome: Progressing 05/21/2024 2333 by Honora Frederick PARAS, RN Outcome: Progressing   Problem: Health Behavior/Discharge Planning: Goal: Ability to manage health-related needs will improve 05/21/2024 2333 by Honora Frederick PARAS, RN Outcome: Progressing 05/21/2024 2333 by Honora Frederick PARAS, RN Outcome: Progressing   Problem: Clinical Measurements: Goal: Ability to maintain clinical measurements within normal limits will improve 05/21/2024 2333 by Honora Frederick PARAS, RN Outcome: Progressing 05/21/2024 2333 by Honora Frederick PARAS, RN Outcome: Progressing Goal: Will remain free from infection 05/21/2024 2333 by Honora Frederick PARAS, RN Outcome: Progressing 05/21/2024 2333 by Honora Frederick PARAS, RN Outcome: Progressing Goal: Diagnostic test results will improve 05/21/2024 2333 by Honora Frederick PARAS, RN Outcome: Progressing 05/21/2024 2333 by Honora Frederick PARAS, RN Outcome: Progressing Goal: Respiratory complications will improve 05/21/2024 2333 by Honora Frederick PARAS, RN Outcome: Progressing 05/21/2024 2333 by Honora Frederick PARAS, RN Outcome: Progressing Goal: Cardiovascular complication will be avoided 05/21/2024 2333 by Honora Frederick PARAS, RN Outcome: Progressing 05/21/2024 2333 by Honora Frederick PARAS, RN Outcome: Progressing   Problem: Activity: Goal: Risk for activity intolerance will decrease 05/21/2024 2333 by Honora Frederick PARAS, RN Outcome: Progressing 05/21/2024 2333 by Honora Frederick PARAS, RN Outcome: Progressing   Problem: Nutrition: Goal: Adequate nutrition will be maintained 05/21/2024 2333 by Honora Frederick PARAS, RN Outcome: Progressing 05/21/2024 2333 by Honora Frederick PARAS, RN Outcome: Progressing   Problem: Coping: Goal: Level of anxiety will  decrease 05/21/2024 2333 by Honora Frederick PARAS, RN Outcome: Progressing 05/21/2024 2333 by Honora Frederick PARAS, RN Outcome: Progressing   Problem: Elimination: Goal: Will not experience complications related to bowel motility 05/21/2024 2333 by Honora Frederick PARAS, RN Outcome: Progressing 05/21/2024 2333 by Honora Frederick PARAS, RN Outcome: Progressing Goal: Will not experience complications related to urinary retention 05/21/2024 2333 by Honora Frederick PARAS, RN Outcome: Progressing 05/21/2024 2333 by Honora Frederick PARAS, RN Outcome: Progressing   Problem: Pain Managment: Goal: General experience of comfort will improve and/or be controlled 05/21/2024 2333 by Honora Frederick PARAS, RN Outcome: Progressing 05/21/2024 2333 by Honora Frederick PARAS, RN Outcome: Progressing   Problem: Safety: Goal: Ability to remain free from injury will improve 05/21/2024 2333 by Honora Frederick PARAS, RN Outcome: Progressing 05/21/2024 2333 by Honora Frederick PARAS, RN Outcome: Progressing   Problem: Skin Integrity: Goal: Risk for impaired skin integrity will decrease 05/21/2024 2333 by Honora Frederick PARAS, RN Outcome: Progressing 05/21/2024 2333 by Honora Frederick PARAS, RN Outcome: Progressing   Problem: Education: Goal: Ability to describe self-care measures that may prevent or decrease complications (Diabetes Survival Skills Education) will improve 05/21/2024 2333 by Honora Frederick PARAS, RN Outcome: Progressing 05/21/2024 2333 by Honora Frederick PARAS, RN Outcome: Progressing Goal: Individualized Educational Video(s) 05/21/2024 2333 by Honora Frederick PARAS, RN Outcome: Progressing 05/21/2024 2333 by Honora Frederick PARAS, RN Outcome: Progressing   Problem: Coping: Goal: Ability to adjust to condition or change in health will improve 05/21/2024 2333 by Honora Frederick PARAS, RN Outcome: Progressing 05/21/2024 2333 by Honora Frederick PARAS, RN Outcome: Progressing   Problem: Fluid Volume: Goal: Ability to maintain a balanced  intake and output will improve 05/21/2024 2333 by Honora Frederick PARAS, RN Outcome: Progressing 05/21/2024 2333 by Honora Frederick PARAS, RN Outcome: Progressing   Problem: Health Behavior/Discharge Planning: Goal: Ability to identify and utilize available resources and services will improve 05/21/2024 2333 by Honora Frederick PARAS, RN Outcome: Progressing 05/21/2024 2333 by  Honora Frederick PARAS, RN Outcome: Progressing Goal: Ability to manage health-related needs will improve 05/21/2024 2333 by Honora Frederick PARAS, RN Outcome: Progressing 05/21/2024 2333 by Honora Frederick PARAS, RN Outcome: Progressing   Problem: Metabolic: Goal: Ability to maintain appropriate glucose levels will improve 05/21/2024 2333 by Honora Frederick PARAS, RN Outcome: Progressing 05/21/2024 2333 by Honora Frederick PARAS, RN Outcome: Progressing   Problem: Nutritional: Goal: Maintenance of adequate nutrition will improve 05/21/2024 2333 by Honora Frederick PARAS, RN Outcome: Progressing 05/21/2024 2333 by Honora Frederick PARAS, RN Outcome: Progressing Goal: Progress toward achieving an optimal weight will improve 05/21/2024 2333 by Honora Frederick PARAS, RN Outcome: Progressing 05/21/2024 2333 by Honora Frederick PARAS, RN Outcome: Progressing   Problem: Skin Integrity: Goal: Risk for impaired skin integrity will decrease 05/21/2024 2333 by Honora Frederick PARAS, RN Outcome: Progressing 05/21/2024 2333 by Honora Frederick PARAS, RN Outcome: Progressing   Problem: Tissue Perfusion: Goal: Adequacy of tissue perfusion will improve Outcome: Progressing

## 2024-05-21 NOTE — NC FL2 (Signed)
 [place]  MEDICAID FL2 LEVEL OF CARE FORM     IDENTIFICATION  Patient Name: [name] Birthdate: 12/08/1940 Sex: female Admission Date (Current Location): 05/19/2024  [facility] and [place] Number:  [employer] and Address:  [facility], [address] [place], [place] [address]      Provider Number: [id number]  Attending Physician Name and Address:  McDiarmid, Krystal BIRCH, MD  Relative Name and Phone Number:       Current Level of Care: Hospital Recommended Level of Care: Skilled Nursing Facility Prior Approval Number:    Date Approved/Denied:   PASRR Number: [id number]  Discharge Plan: SNF    Current Diagnoses: Patient Active Problem List   Diagnosis Date Noted   Sacral osteomyelitis (HCC) 05/20/2024   Acute renal failure superimposed on stage 3 chronic kidney disease (HCC) 05/20/2024   Confined to bed 05/20/2024   Sacral fracture with ulcer and possible osteomyelitis 05/19/2024   Fall 05/19/2024   CKD (chronic kidney disease), stage III (HCC) 05/19/2024   Venous stasis dermatitis of both lower extremities 05/19/2024   Elevated BUN 02/06/2024   Pressure injury of skin 02/03/2024   PVC (premature ventricular contraction) 02/02/2024   NSVT (nonsustained ventricular tachycardia) (HCC) 02/02/2024   Bilateral lower extremity edema 02/01/2024   Hx of CABG 02/01/2024   Long term (current) use of anticoagulants 02/01/2024   Atherosclerosis of native coronary artery of native heart without angina pectoris 02/01/2024   Cellulitis of right leg 01/30/2024   Generalized weakness 01/30/2024   Type 2 diabetes mellitus (HCC) 01/30/2024   Cognitive and behavioral changes 01/29/2024   Sleep concern 12/23/2023   Vaginal bleeding 11/25/2023   Osteoarthritis of both shoulders 05/22/2023   Advanced nonexudative age-related macular degeneration of right eye with subfoveal involvement 07/31/2022   Intermediate stage nonexudative age-related macular  degeneration of left eye 03/28/2022   Advanced nonexudative age-related macular degeneration of right eye without subfoveal involvement 09/28/2021   Vitreous hemorrhage of right eye (HCC) 04/17/2021   Severe nonproliferative diabetic retinopathy of both eyes (HCC) 06/14/2020   Exudative age-related macular degeneration of right eye with inactive choroidal neovascularization (HCC) 05/02/2020   Exudative age-related macular degeneration of left eye with inactive choroidal neovascularization (HCC) 03/29/2020   Vitreomacular adhesion of left eye 03/29/2020   Left retinoschisis 03/29/2020   CHF (congestive heart failure) (HCC)    Vertebral osteomyelitis (HCC) 10/06/2018   Iron deficiency anemia 05/19/2018   OSA on CPAP    Hypertension    Facial weakness, post-stroke    Chronic lower back pain    Venous stasis 07/18/2017   Blood loss anemia 04/09/2017   History of CVA with residual deficit 03/26/2017   Pulmonary hypertension (HCC) 03/26/2017   Chronic pain syndrome    Coronary artery disease involving coronary bypass graft of native heart without angina pectoris    Acute on chronic combined systolic and diastolic CHF (congestive heart failure) (HCC)    DM type 2 with diabetic peripheral neuropathy (HCC)    Thrombocytopenia (HCC)    CKD (chronic kidney disease), stage IV (HCC) 03/30/2016   Chronic anticoagulation 03/30/2016   Hyperlipidemia 01/31/2009   Chronic systolic heart failure (HCC) 01/31/2009   Atrial fibrillation (HCC) 01/31/2009   Ischemic cardiomyopathy 01/31/2009   CAD (coronary artery disease) 05/12/2008    Orientation RESPIRATION BLADDER Height & Weight     Self, Situation, Place  Normal Continent, Incontinent (incontinent at times) Weight: 205 lb 4 oz (93.1 kg) Height:  5' 7 (  170.2 cm)  BEHAVIORAL SYMPTOMS/MOOD NEUROLOGICAL BOWEL NUTRITION STATUS      Continent Diet (renal, carb modified, fluid restriction to 1200 mL)  AMBULATORY STATUS COMMUNICATION OF NEEDS Skin    Extensive Assist Verbally PU Stage and Appropriate Care       PU Stage 4 Dressing: BID (sacrum: foam dressing, change every shift)               Personal Care Assistance Level of Assistance  Bathing, Dressing, Feeding Bathing Assistance: Maximum assistance Feeding assistance: Limited assistance Dressing Assistance: Maximum assistance     Functional Limitations Info  Sight, Hearing Sight Info: Impaired Hearing Info: Impaired      SPECIAL CARE FACTORS FREQUENCY  PT (By licensed PT), OT (By licensed OT)     PT Frequency: 5x/wk OT Frequency: 5x/wk            Contractures Contractures Info: Not present    Additional Factors Info  Code Status, Allergies, Insulin  Sliding Scale Code Status Info: Full Allergies Info: Benazepril, Cymbalta  (Duloxetine  Hcl), Ozempic  (0.25 Or 0.5 Mg-dose) (Semaglutide (0.25 Or 0.5mg -dos)), Tape, Angiotensin Receptor Blockers, Bactrim (Sulfamethoxazole-trimethoprim), Fe-succ-c-thre-b12-des Stomach   Insulin  Sliding Scale Info: see DC summary       Current Medications (05/21/2024):  This is the current hospital active medication list Current Facility-Administered Medications  Medication Dose Route Frequency Provider Last Rate Last Admin   acetaminophen  (TYLENOL ) tablet 1,000 mg  1,000 mg Oral Q6H Baker, Amelia G, DO   1,000 mg at 05/21/24 9171   amiodarone  (PACERONE ) tablet 200 mg  200 mg Oral Daily Baker, Amelia G, DO   200 mg at 05/21/24 0827   Ampicillin -Sulbactam (UNASYN ) 3 g in sodium chloride  0.9 % 100 mL IVPB  3 g Intravenous Q12H Fleeta Rothman, Jomarie SAILOR, MD   Stopped at 05/21/24 0548   apixaban  (ELIQUIS ) tablet 2.5 mg  2.5 mg Oral BID Baker, Amelia G, DO   2.5 mg at 05/21/24 9173   atorvastatin  (LIPITOR) tablet 40 mg  40 mg Oral Daily Baker, Amelia G, DO   40 mg at 05/21/24 9171   brimonidine  (ALPHAGAN ) 0.2 % ophthalmic solution 1 drop  1 drop Both Eyes BID Baker, Amelia G, DO   1 drop at 05/21/24 9170   cholecalciferol  (VITAMIN D3) 25 MCG  (1000 UNIT) tablet 1,000 Units  1,000 Units Oral Daily Baker, Amelia G, DO   1,000 Units at 05/21/24 0825   cyanocobalamin  (VITAMIN B12) tablet 1,000 mcg  1,000 mcg Oral Daily Baker, Amelia G, DO   1,000 mcg at 05/21/24 0827   ferrous sulfate  tablet 325 mg  325 mg Oral Q breakfast Baker, Amelia G, DO   325 mg at 05/21/24 9172   HYDROmorphone  (DILAUDID ) injection 1 mg  1 mg Intravenous Q4H PRN Baker, Amelia G, DO   1 mg at 05/20/24 1236   insulin  aspart (novoLOG ) injection 0-9 Units  0-9 Units Subcutaneous TID WC Baker, Amelia G, DO       lidocaine  (LIDODERM ) 5 % 1 patch  1 patch Transdermal Q24H Baker, Amelia G, DO   1 patch at 05/20/24 2149   LORazepam  (ATIVAN ) 2 MG/ML concentrated solution 1 mg  1 mg Oral PRN Shitarev, Dimitry, MD       mineral oil-hydrophilic petrolatum  (AQUAPHOR) ointment   Topical Daily PRN Baker, Amelia G, DO       multivitamin (PROSIGHT) tablet 1 tablet  1 tablet Oral Daily Baker, Amelia G, DO   1 tablet at 05/21/24 0828   polyethylene glycol (MIRALAX  /  GLYCOLAX ) packet 17 g  17 g Oral BID Baker, Amelia G, DO   17 g at 05/21/24 0825   rOPINIRole  (REQUIP ) tablet 1 mg  1 mg Oral QHS Baker, Amelia G, DO   1 mg at 05/20/24 2148   senna (SENOKOT) tablet 8.6 mg  1 tablet Oral BID Baker, Amelia G, DO   8.6 mg at 05/21/24 0827   sodium hypochlorite (DAKIN'S 1/4 STRENGTH) topical solution   Irrigation BID Anders Otto DASEN, MD   Given at 05/21/24 9170   traMADol  (ULTRAM ) tablet 50 mg  50 mg Oral Q8H Tharon Lung, MD   50 mg at 05/21/24 0827   triamcinolone  ointment (KENALOG ) 0.1 %   Topical BID Baker, Amelia G, DO   Given at 05/21/24 9171     Discharge Medications: Please see discharge summary for a list of discharge medications.  Relevant Imaging Results:  Relevant Lab Results:   Additional Information SS#: [id number]  Almarie CHRISTELLA Goodie, LCSW

## 2024-05-21 NOTE — Assessment & Plan Note (Addendum)
 Patient has had coordination and balance issues with increasing age and her history of stroke.  Recent hospitalization and rehab stay further compounding picture with profound deconditioning.  Patient fell at home and endorses pain in the left hip that radiates upward.  No bruising of the hip noted on exam.  No acute fractures other than sacral fracture as above noted on x-ray. - Pain control as above - Lidocaine  patch over left hip - PT and OT as above

## 2024-05-21 NOTE — TOC Initial Note (Signed)
 Transition of Care [facility]) - Initial/Assessment Note    Patient Details  Name: [name] MRN: [id number] Date of Birth: 1941-10-13  [facility] CM/SW Contact:    Almarie CHRISTELLA Goodie, LCSW Phone Number: 05/21/2024, 3:41 PM  Clinical Narrative:      CSW updated by MD that patient and son in agreement with SNF, but do not want to return to where she was previously [facility]). CSW completed referral and faxed out, then met with patient later to provide bed offers. CSW provided CMS choice list and answered patient's questions. Patient to review with son, CSW to check back tomorrow. CSW to follow.          Expected Discharge Plan: Skilled Nursing Facility Barriers to Discharge: Continued Medical Work up, English as a second language teacher   Patient Goals and CMS Choice Patient states their goals for this hospitalization and ongoing recovery are:: get rehab and get back home CMS Medicare.gov Compare Post Acute Care list provided to:: Patient Choice offered to / list presented to : Patient [place] ownership interest in [facility].provided to:: Patient    Expected Discharge Plan and Services     Post Acute Care Choice: Skilled Nursing Facility Living arrangements for the past 2 months: Skilled Nursing Facility, Single Family Home                                      Prior Living Arrangements/Services Living arrangements for the past 2 months: Skilled Nursing Facility, Single Family Home Lives with:: Adult Children Patient language and need for interpreter reviewed:: No Do you feel safe going back to the place where you live?: Yes      Need for Family Participation in Patient Care: Yes (Comment) Care giver support system in place?: Yes (comment)   Criminal Activity/Legal Involvement Pertinent to Current Situation/Hospitalization: No - Comment as needed  Activities of Daily Living   ADL Screening (condition at time of admission) Independently performs  ADLs?: No Does the patient have a NEW difficulty with bathing/dressing/toileting/self-feeding that is expected to last >3 days?: No Does the patient have a NEW difficulty with getting in/out of bed, walking, or climbing stairs that is expected to last >3 days?: No Does the patient have a NEW difficulty with communication that is expected to last >3 days?: No Is the patient deaf or have difficulty hearing?: Yes Does the patient have difficulty seeing, even when wearing glasses/contacts?: Yes Does the patient have difficulty concentrating, remembering, or making decisions?: Yes  Permission Sought/Granted Permission sought to share information with : Facility Medical sales representative, Family Supports Permission granted to share information with : Yes, Verbal Permission Granted  Share Information with NAME: Lamar  Permission granted to share info w AGENCY: SNF  Permission granted to share info w Relationship: Son     Emotional Assessment Appearance:: Appears stated age Attitude/Demeanor/Rapport: Engaged Affect (typically observed): Appropriate Orientation: : Oriented to Self, Oriented to Place, Oriented to Situation Alcohol / Substance Use: Not Applicable Psych Involvement: No (comment)  Admission diagnosis:  Sacral ulcer (HCC) [L98.429] Patient Active Problem List   Diagnosis Date Noted   Stage IV pressure ulcer of sacral region (HCC) 05/21/2024   Sacral osteomyelitis (HCC) 05/20/2024   Acute renal failure superimposed on stage 3 chronic kidney disease (HCC) 05/20/2024   Confined to bed 05/20/2024   Sacral fracture with ulcer and possible osteomyelitis 05/19/2024   Fall 05/19/2024   Acute  kidney injury superimposed on stage 3a chronic kidney disease (HCC) 05/19/2024   Venous stasis dermatitis of both lower extremities 05/19/2024   Elevated BUN 02/06/2024   Pressure injury of skin 02/03/2024   PVC (premature ventricular contraction) 02/02/2024   NSVT (nonsustained ventricular  tachycardia) (HCC) 02/02/2024   Bilateral lower extremity edema 02/01/2024   Hx of CABG 02/01/2024   Long term (current) use of anticoagulants 02/01/2024   Atherosclerosis of native coronary artery of native heart without angina pectoris 02/01/2024   Cellulitis of right leg 01/30/2024   Generalized weakness 01/30/2024   Type 2 diabetes mellitus (HCC) 01/30/2024   Cognitive and behavioral changes 01/29/2024   Sleep concern 12/23/2023   Vaginal bleeding 11/25/2023   Osteoarthritis of both shoulders 05/22/2023   Advanced nonexudative age-related macular degeneration of right eye with subfoveal involvement 07/31/2022   Intermediate stage nonexudative age-related macular degeneration of left eye 03/28/2022   Advanced nonexudative age-related macular degeneration of right eye without subfoveal involvement 09/28/2021   Vitreous hemorrhage of right eye (HCC) 04/17/2021   Severe nonproliferative diabetic retinopathy of both eyes (HCC) 06/14/2020   Exudative age-related macular degeneration of right eye with inactive choroidal neovascularization (HCC) 05/02/2020   Exudative age-related macular degeneration of left eye with inactive choroidal neovascularization (HCC) 03/29/2020   Vitreomacular adhesion of left eye 03/29/2020   Left retinoschisis 03/29/2020   CHF (congestive heart failure) (HCC)    Vertebral osteomyelitis (HCC) 10/06/2018   Iron deficiency anemia 05/19/2018   OSA on CPAP    Hypertension    Facial weakness, post-stroke    Chronic lower back pain    Venous stasis 07/18/2017   Blood loss anemia 04/09/2017   History of CVA with residual deficit 03/26/2017   Pulmonary hypertension (HCC) 03/26/2017   Chronic pain syndrome    Coronary artery disease involving coronary bypass graft of native heart without angina pectoris    Acute on chronic combined systolic and diastolic CHF (congestive heart failure) (HCC)    DM type 2 with diabetic peripheral neuropathy (HCC)    Thrombocytopenia  (HCC)    CKD (chronic kidney disease), stage IV (HCC) 03/30/2016   Chronic anticoagulation 03/30/2016   Hyperlipidemia 01/31/2009   Chronic systolic heart failure (HCC) 01/31/2009   Atrial fibrillation (HCC) 01/31/2009   Ischemic cardiomyopathy 01/31/2009   CAD (coronary artery disease) 05/12/2008   PCP:  Tharon Lung, MD Pharmacy:   [facility] - [place], [place] - [address] [place] [address] [place] [address] Phone: [phone] Fax: [phone]  [facility] - [place], [place] - [facility] Portal to Registered [facility] [address] [place] [address] Phone: [phone] Fax: [phone]  [facility] [address] [place] [address] Phone: [phone] Fax: [phone]     Social Drivers of Health (SDOH) Social History: SDOH Screenings   Food Insecurity: No Food Insecurity (05/20/2024)  Housing: Low Risk  (05/20/2024)  Transportation Needs: No Transportation Needs (05/20/2024)  Utilities: Not At Risk (05/20/2024)  Alcohol Screen: Low Risk  (05/07/2024)  Depression (PHQ2-9): Low Risk  (05/07/2024)  Financial Resource Strain: Low Risk  (05/07/2024)  Physical Activity: Inactive (05/07/2024)  Social Connections: Socially Isolated (05/20/2024)  Stress: No Stress Concern Present (05/07/2024)  Tobacco Use: Low Risk  (05/19/2024)  Health Literacy: Inadequate Health Literacy (05/07/2024)   SDOH Interventions:     Readmission Risk Interventions     No data to display

## 2024-05-22 ENCOUNTER — Inpatient Hospital Stay (HOSPITAL_COMMUNITY)

## 2024-05-22 ENCOUNTER — Encounter (HOSPITAL_COMMUNITY): Payer: Self-pay

## 2024-05-22 DIAGNOSIS — N184 Chronic kidney disease, stage 4 (severe): Secondary | ICD-10-CM

## 2024-05-22 DIAGNOSIS — R710 Precipitous drop in hematocrit: Secondary | ICD-10-CM | POA: Diagnosis not present

## 2024-05-22 DIAGNOSIS — D649 Anemia, unspecified: Secondary | ICD-10-CM | POA: Diagnosis present

## 2024-05-22 DIAGNOSIS — M4628 Osteomyelitis of vertebra, sacral and sacrococcygeal region: Secondary | ICD-10-CM | POA: Diagnosis not present

## 2024-05-22 DIAGNOSIS — L89154 Pressure ulcer of sacral region, stage 4: Secondary | ICD-10-CM | POA: Diagnosis not present

## 2024-05-22 LAB — CBC
HCT: 23.4 % — ABNORMAL LOW (ref 36.0–46.0)
Hemoglobin: 7.2 g/dL — ABNORMAL LOW (ref 12.0–15.0)
MCH: 27.3 pg (ref 26.0–34.0)
MCHC: 30.8 g/dL (ref 30.0–36.0)
MCV: 88.6 fL (ref 80.0–100.0)
Platelets: 219 K/uL (ref 150–400)
RBC: 2.64 MIL/uL — ABNORMAL LOW (ref 3.87–5.11)
RDW: 20.9 % — ABNORMAL HIGH (ref 11.5–15.5)
WBC: 5.9 K/uL (ref 4.0–10.5)
nRBC: 0 % (ref 0.0–0.2)

## 2024-05-22 LAB — GLUCOSE, CAPILLARY
Glucose-Capillary: 100 mg/dL — ABNORMAL HIGH (ref 70–99)
Glucose-Capillary: 131 mg/dL — ABNORMAL HIGH (ref 70–99)
Glucose-Capillary: 164 mg/dL — ABNORMAL HIGH (ref 70–99)
Glucose-Capillary: 133 mg/dL — ABNORMAL HIGH (ref 70–99)

## 2024-05-22 LAB — BASIC METABOLIC PANEL WITH GFR
Anion gap: 10 (ref 5–15)
BUN: 57 mg/dL — ABNORMAL HIGH (ref 8–23)
CO2: 18 mmol/L — ABNORMAL LOW (ref 22–32)
Calcium: 9.1 mg/dL (ref 8.9–10.3)
Chloride: 107 mmol/L (ref 98–111)
Creatinine, Ser: 2.33 mg/dL — ABNORMAL HIGH (ref 0.44–1.00)
GFR, Estimated: 20 mL/min — ABNORMAL LOW (ref >60)
Glucose, Bld: 122 mg/dL — ABNORMAL HIGH (ref 70–99)
Potassium: 3.9 mmol/L (ref 3.5–5.1)
Sodium: 135 mmol/L (ref 135–145)

## 2024-05-22 LAB — HEMOGLOBIN AND HEMATOCRIT, BLOOD
HCT: 27 % — ABNORMAL LOW (ref 36.0–46.0)
Hemoglobin: 8.6 g/dL — ABNORMAL LOW (ref 12.0–15.0)

## 2024-05-22 LAB — PREPARE RBC (CROSSMATCH)

## 2024-05-22 MED ORDER — SODIUM CHLORIDE 0.9% IV SOLUTION: Freq: Once | INTRAVENOUS | Status: AC

## 2024-05-22 MED ORDER — LORAZEPAM 2 MG/ML IJ SOLN
1.0000 mg | Freq: Once | INTRAMUSCULAR | Status: AC | PRN
Start: 2024-05-22 — End: 2024-05-22
  Administered 2024-05-22: 1 mg via INTRAVENOUS
  Filled 2024-05-22: qty 1

## 2024-05-22 MED ORDER — TRAMADOL HCL 50 MG PO TABS
50.0000 mg | ORAL_TABLET | Freq: Two times a day (BID) | ORAL | Status: DC
  Administered 2024-05-22 – 2024-05-25 (×6): 50 mg via ORAL
  Filled 2024-05-22 (×6): qty 1

## 2024-05-22 NOTE — Care Management Important Message (Signed)
 Important Message  Patient Details  Name: [name] MRN: [id number] Date of Birth: 13-Jan-1941   Important Message Given:  Yes - Medicare IM     [name] 05/22/2024, 4:15 PM

## 2024-05-22 NOTE — Progress Notes (Incomplete)
 Daily Progress Note Intern Pager: [phone]  Patient name: [name] Medical record number: [id number] Date of birth: December 23, 1940 Age: 83 y.o. Gender: female  Primary Care Provider: Tharon Lung, MD Consultants: Ortho, wound care, ID Code Status: Full  Pt Overview and Major Events to Date:  7/9-admit  Medical Decision Making:  Patient is a 83 year old female presenting with a fall likely with deconditioning and pre-existing large stage IV sacral ulcer and CT/MRI findings of osteomyelitis and sacral fracture. Pertinent PMH/PSH includes CHF, CKD, venous stasis, T2DM.  Assessment & Plan Fall In the context of profound deconditioning.  Imaging this admission with sacral fracture as below.  She has been working with PT and OT. -Continue pain control with orals and lidocaine  patches - PT/OT Sacral osteomyelitis (HCC) Stage IV pressure ulcer of sacral region [facility]) Remains hemodynamically stable on antibiotics.  No need for debridement of ulcer per orthopedics and general surgery. Pelvic MRI has been ordered to assess extent into the lumbar spine as well as any involvement of lumbar discitis.  Blood cultures reassuringly negative to date. -Continue Unasyn  with transition to p.o. Augmentin  pending continued clinical improvement - Continue home Tylenol , decreased home tramadol  due to worsening renal function - Continue Dilaudid  1 mg every 4 as needed for severe pain - Continue pressure-relief bedding, turning patient for 2 hours, PT/OT, wound care Acute kidney injury superimposed on stage 4 chronic kidney disease (HCC) Creatinine overall stable though more elevated from baseline, GFR 20.  She received 1 L of fluid over this admission so far.  She does not appear volume overloaded. -Continue to encourage p.o. intake - AM CMP -Outpatient follow-up with nephrologist CHF (congestive heart failure) (HCC) Venous stasis dermatitis of both lower extremities Recent echo March 2025 with LVEF  35 to 40%.  Does not appear volume overloaded.  Stasis dermatitis improved with steroids. -Continue to hold metoprolol  and torsemide  given soft pressures, reinitiate as needed - Continue triamcinolone  ointment for dermatitis Type 2 diabetes mellitus (HCC) Glucoses controlled on sensitive sliding scale insulin . -Continue SSI Normocytic anemia Status post 1 unit packed red blood cells 7/10 for hemoglobin below 8.  Most likely multifactorial in the setting of anemia of chronic disease from CKD and acute inflammatory process.  However, GI bleed is not completely excluded given her history. -AM CBC, transfuse as needed for hemoglobin less than 8 -If continues to downtrend, or if FOBT positive, will have a conversation with patient about continuing anticoagulation and consulting GI -Question need for EPO long-term per nephrology  FEN/GI: Renal diet PPx: Home Eliquis  Dispo:SNF pending clinical improvement .   Subjective:  ***  Objective: Temp:  [97.9 F (36.6 C)-99.6 F (37.6 C)] 99.6 F (37.6 C) (07/11 1934) Pulse Rate:  [67-86] 82 (07/11 1934) Resp:  [18-19] 19 (07/11 1934) BP: (71-131)/(30-75) 118/52 (07/11 1934) SpO2:  [100 %] 100 % (07/11 1934) Physical Exam: General: *** Cardiovascular: *** Respiratory: *** Abdomen: *** Extremities: ***  Laboratory: Most recent CBC Lab Results  Component Value Date   WBC 5.9 05/22/2024   HGB 8.6 (L) 05/22/2024   HCT 27.0 (L) 05/22/2024   MCV 88.6 05/22/2024   PLT 219 05/22/2024   Most recent BMP    Latest Ref Rng & Units 05/22/2024    2:11 AM  BMP  Glucose 70 - 99 mg/dL 877   BUN 8 - 23 mg/dL 57   Creatinine 9.55 - 1.00 mg/dL 7.66   Sodium 864 - 854 mmol/L 135   Potassium 3.5 -  5.1 mmol/L 3.9   Chloride 98 - 111 mmol/L 107   CO2 22 - 32 mmol/L 18   Calcium  8.9 - 10.3 mg/dL 9.1    Imaging/Diagnostic Tests:  MRI lumbar spine without contrast PIERRETTE Tharon Lung, MD 05/22/2024, 10:00 PM  PGY-3, [facility] FPTS Intern pager: [phone], text pages welcome Secure chat group [facility] Teaching Service

## 2024-05-22 NOTE — Assessment & Plan Note (Signed)
 Recent echo March 2025 with LVEF 35 to 40%.  Does not appear volume overloaded.  Stasis dermatitis improved with steroids. -Continue to hold metoprolol  and torsemide  given soft pressures, reinitiate as needed - Continue triamcinolone  ointment for dermatitis

## 2024-05-22 NOTE — Assessment & Plan Note (Addendum)
-  Ortho and surgery declined to debride this patient's wound.  -Wound care consulted, proceed with their plan including DAKIN's and transition to WTD BID.

## 2024-05-22 NOTE — Assessment & Plan Note (Addendum)
 Approximately 10 cm sacral ulcer, bone appreciated on exam. CT abdomen pelvis without contrast shows focal area of sacral fracture with adjacent air that may represent a traumatic fracture or osteomyelitis.  Reassuringly hemodynamically stable at this time with reassuring vitals and lack of neurological deficits.  Lactic acid downtrending, no concern for sepsis at this time. -Patient successfully underwent pelvic MRI 7/9, confirming diagnosis of sacral osteomyelitis. Questionable extension into lumbar, patient agreed to lumbar MRI for definitive clinical information to guide antibiotic therapy.  -Transitioned to Unasyn  7/10 with transition to PO augmentin  pending patient's clinical improvement.  - FMTS will admit, attending Dr. Donzetta for antibiotics - Wound care consulted - Tylenol  1000 mg every 6 hours - Home tramadol  50 mg every 8 hours - Dilaudid  1 mg every 4 hours as needed for severe pain - Turn patient every 2 hours for pressure relief, will discuss with nursing for pressure relief bedding - PT and OT - Blood cultures NGTD at 3 days

## 2024-05-22 NOTE — Assessment & Plan Note (Signed)
 Creatinine overall stable though more elevated from baseline, GFR 20.  She received 1 L of fluid over this admission so far.  She does not appear volume overloaded. -Continue to encourage p.o. intake - AM CMP -Outpatient follow-up with nephrologist

## 2024-05-22 NOTE — TOC Progression Note (Signed)
 Transition of Care [facility]) - Progression Note    Patient Details  Name: [name] MRN: [id number] Date of Birth: [birth year]  Transition of Care [facility]) CM/SW Contact  Almarie CHRISTELLA Goodie, [place] Phone Number: 05/22/2024, 12:53 PM  Clinical Narrative:   Patient and son chose [facility] for SNF. CSW contacted [facility] to confirm bed availability and request that they initiate insurance authorization, awaiting approval. CSW to follow.    Expected Discharge Plan: Skilled Nursing Facility Barriers to Discharge: Continued Medical Work up, English as a second language teacher  Expected Discharge Plan and Services     Post Acute Care Choice: Skilled Nursing Facility Living arrangements for the past 2 months: Skilled Nursing Facility, Single Family Home                                       Social Determinants of Health (SDOH) Interventions SDOH Screenings   Food Insecurity: No Food Insecurity (05/20/2024)  Housing: Low Risk  (05/20/2024)  Transportation Needs: No Transportation Needs (05/20/2024)  Utilities: Not At Risk (05/20/2024)  Alcohol Screen: Low Risk  (05/07/2024)  Depression (PHQ2-9): Low Risk  (05/07/2024)  Financial Resource Strain: Low Risk  (05/07/2024)  Physical Activity: Inactive (05/07/2024)  Social Connections: Socially Isolated (05/20/2024)  Stress: No Stress Concern Present (05/07/2024)  Tobacco Use: Low Risk  (05/19/2024)  Health Literacy: Inadequate Health Literacy (05/07/2024)    Readmission Risk Interventions     No data to display

## 2024-05-22 NOTE — Progress Notes (Addendum)
 Daily Progress Note Intern Pager: [phone]  Patient name: [name] Medical record number: [id number] Date of birth: [birth year] Age: 83 y.o. Gender: female  Primary Care Provider: Tharon Lung, MD Consultants: Ortho, wound care, ID Code Status: Full  Pt Overview and Major Events to Date:  7/9: Admit  Medical Decision Making: 83 year old female presenting with fall and pre-existing large stage IV sacral ulcer. CT scan significant for findings of osteomyelitis and sacral fracture. Fall most likely secondary to combination of deconditioning in the setting of prolonged hospitalizations and rehab, and worsened balance and coordination given history of stroke.  Assessment & Plan Fall Patient has had coordination and balance issues with increasing age and her history of stroke.  Recent hospitalization and rehab stay further compounding picture with profound deconditioning.  Patient fell at home and endorses pain in the left hip that radiates upward.  No bruising of the hip noted on exam.  No acute fractures other than sacral fracture as above noted on x-ray. - Pain control as above - Lidocaine  patch over left hip - PT and OT as above Sacral osteomyelitis (HCC) Approximately 10 cm sacral ulcer, bone appreciated on exam. CT abdomen pelvis without contrast shows focal area of sacral fracture with adjacent air that may represent a traumatic fracture or osteomyelitis.  Reassuringly hemodynamically stable at this time with reassuring vitals and lack of neurological deficits.  Lactic acid downtrending, no concern for sepsis at this time. -Patient successfully underwent pelvic MRI 7/9, confirming diagnosis of sacral osteomyelitis. Questionable extension into lumbar, patient agreed to lumbar MRI for definitive clinical information to guide antibiotic therapy.  -Transitioned to Unasyn  7/10 with transition to PO augmentin  pending patient's clinical improvement.  - FMTS will admit, attending Dr.  Donzetta for antibiotics - Wound care consulted - Tylenol  1000 mg every 6 hours - Home tramadol  50 mg every 8 hours - Dilaudid  1 mg every 4 hours as needed for severe pain - Turn patient every 2 hours for pressure relief, will discuss with nursing for pressure relief bedding - PT and OT - Blood cultures NGTD at 3 days Acute kidney injury superimposed on stage 3a chronic kidney disease (HCC) Kidney function appears to be worsening on chart review, prior baseline ~1.8 in March 2025.  Creatinine 2.35 7/8, GFR 20.  Mucous membranes dry on exam.  Patient has received roughly 1 L of fluids between bolus and IV antibiotics in the ED. - Creatinine 2.33 7/11, 2.39 7/10, 2.28 7/9. - Encourage p.o. intake - Can consider additional fluid's as needed, caution 2/2 CHF CHF (congestive heart failure) (HCC) Venous stasis dermatitis of both lower extremities Echo in March 2025 with LVEF 35 to 40%.  Appears at baseline volume status.  She does have evidence of venous stasis dermatitis on exam. Some edema though non pitting -Held home metoprolol  and torsemide  given soft pressures.  -Triamcinolone  0.1% ointment to bilateral lower legs for venous stasis dermatitis Stage IV pressure ulcer of sacral region (HCC) -Ortho and surgery declined to debride this patient's wound.  -Wound care consulted, proceed with their plan including DAKIN's and transition to WTD BID.  Type 2 diabetes mellitus (HCC) Patient on Trulicity  1.5 mg outpatient.  A1c 7.8 in March. - SSI due to CKD Normocytic anemia Hgb ~9 at baseline, has trended down during admission to 7.2 7/11. Given hx of CAD, transfusion threshold <8. -Most likely multifactorial, including anemia of chronic disease, CKD, active inflammatory process.  -Obtained consent for blood transfusion and administered one unit  pRBCs 7/11.   FEN/GI: Renal diet with 1200 mL fluid restriction PPx: Home Eliquis  Dispo:SNF pending clinical improvement . Barriers include osteomyelitis,  transition to oral antibiotics.  Subjective:  Patient reports NAEON. She's agreeable to Lumbar MRI w/ anxiolysis after discussion of its clinical value. Her pain is well controlled. She and her son also expressed their preference for SNF was facility in [place], which we conveyed to SW.   Objective: Temp:  [97.8 F (36.6 C)-98.6 F (37 C)] 98 F (36.7 C) (07/11 0334) Pulse Rate:  [67-89] 80 (07/11 0334) Resp:  [18-19] 19 (07/11 0334) BP: (71-128)/(24-76) 108/55 (07/11 0334) SpO2:  [100 %] 100 % (07/11 0334) Physical Exam: General: Awake, alert, no acute distress.  Communicating clearly. Cardiovascular: Regular rate and rhythm, no murmurs rubs or gallops Respiratory: Clear to auscultation bilaterally Abdomen: Soft, nondistended, nontender Skin: 10 cm stafe IV sacral ulcer Neuro: Aox4, no focal deficits.   Laboratory: Most recent CBC Lab Results  Component Value Date   WBC 5.9 05/22/2024   HGB 7.2 (L) 05/22/2024   HCT 23.4 (L) 05/22/2024   MCV 88.6 05/22/2024   PLT 219 05/22/2024   Most recent BMP    Latest Ref Rng & Units 05/22/2024    2:11 AM  BMP  Glucose 70 - 99 mg/dL 877   BUN 8 - 23 mg/dL 57   Creatinine 9.55 - 1.00 mg/dL 7.66   Sodium 864 - 854 mmol/L 135   Potassium 3.5 - 5.1 mmol/L 3.9   Chloride 98 - 111 mmol/L 107   CO2 22 - 32 mmol/L 18   Calcium  8.9 - 10.3 mg/dL 9.1    Imaging/Diagnostic Tests:  Manon Jester, DO 05/22/2024, 7:36 AM  PGY-1, [facility] FPTS Intern pager: [phone], text pages welcome Secure chat group [facility] Teaching Service

## 2024-05-22 NOTE — Progress Notes (Signed)
Blood consent signed and placed in patient's chart.

## 2024-05-22 NOTE — Assessment & Plan Note (Signed)
 Echo in March 2025 with LVEF 35 to 40%.  Appears at baseline volume status.  She does have evidence of venous stasis dermatitis on exam. Some edema though non pitting -Held home metoprolol  and torsemide  given soft pressures.  -Triamcinolone  0.1% ointment to bilateral lower legs for venous stasis dermatitis

## 2024-05-22 NOTE — Assessment & Plan Note (Signed)
 Patient on Trulicity  1.5 mg outpatient.  A1c 7.8 in March. - SSI due to CKD

## 2024-05-22 NOTE — Assessment & Plan Note (Signed)
 Status post 1 unit packed red blood cells 7/10 for hemoglobin below 8.  Most likely multifactorial in the setting of anemia of chronic disease from CKD and acute inflammatory process.  However, GI bleed is not completely excluded given her history. -AM CBC, transfuse as needed for hemoglobin less than 8 -If continues to downtrend, or if FOBT positive, will have a conversation with patient about continuing anticoagulation and consulting GI -Question need for EPO long-term per nephrology

## 2024-05-22 NOTE — Progress Notes (Signed)
 Physical Therapy Treatment Patient Details Name: [name] MRN: [id number] DOB: 01-Oct-1941 Today's Date: 05/22/2024   History of Present Illness Pt is a 83 yr old female who presented due to a fall and sacral wound. CT showed sacral fx. Pt recently return home from SNF. PMH: CHF, CKD 4, DM II, diabetic neuropathy, GERD, CVA 2018, Afib on eliquis , OSA and thrombocytopenia, macular degeneration, CAD, venous stasis    PT Comments  Pt limited this session secondary to awaiting blood transfusion/MRI and limited to bed level.  Pt remains motivated.  Focused on positioning in bed to encourage upright sitting.  Pt continues to benefit from rehab in a post acute setting.     If plan is discharge home, recommend the following: Two people to help with bathing/dressing/bathroom;Two people to help with walking and/or transfers   Can travel by private vehicle     No  Equipment Recommendations  None recommended by PT    Recommendations for Other Services       Precautions / Restrictions Precautions Precautions: Fall Restrictions Weight Bearing Restrictions Per Provider Order: No     Mobility  Bed Mobility Overal bed mobility: Needs Assistance Bed Mobility: Rolling (boosting to [facility]) Rolling: Max assist, Used rails (to Roll to R side to allow for propping of pillows to improve L lateral bending.)         General bed mobility comments: Pt performed scooting to [facility] in trended position with use of draw sheet.  Total +1 to scoot.  Pt performed rolling to R side with max assistance +1 and pillows placed to improve L lateral bending,  Once positioned, placed be in chair position to improve lung function and upright posture.    Transfers Overall transfer level:  (deferred OOB secondary to weakness and need for blodd transfusion and MRI later this pm.)                      Ambulation/Gait                   Stairs             Wheelchair Mobility     Tilt Bed     Modified Rankin (Stroke Patients Only)       Balance                                            Communication Communication Communication: No apparent difficulties  Cognition Arousal: Alert Behavior During Therapy: WFL for tasks assessed/performed   PT - Cognitive impairments: No family/caregiver present to determine baseline                         Following commands: Intact      Cueing Cueing Techniques: Verbal cues  Exercises General Exercises - Lower Extremity Ankle Circles/Pumps: AROM, Both, 20 reps, Supine Quad Sets: AROM, Both, 10 reps, Supine Heel Slides: AROM, Both, 10 reps, Supine Hip ABduction/ADduction: AROM, Both, 10 reps, Supine Straight Leg Raises: AROM, Both, 10 reps, Supine, Limitations Straight Leg Raises Limitations: limited on R secondary to increased pain.    General Comments        Pertinent Vitals/Pain Pain Assessment Pain Assessment: Faces Faces Pain Scale: Hurts even more Pain Location: bottom/skin> legs (R>L) Pain Descriptors / Indicators: Discomfort, Grimacing, Guarding Pain Intervention(s): Monitored during session, Repositioned  Home Living                          Prior Function            PT Goals (current goals can now be found in the care plan section) Acute Rehab PT Goals Patient Stated Goal: return home Potential to Achieve Goals: Fair Progress towards PT goals: Progressing toward goals    Frequency    Min 2X/week      PT Plan      Co-evaluation              AM-PAC PT 6 Clicks Mobility   Outcome Measure  Help needed turning from your back to your side while in a flat bed without using bedrails?: Total Help needed moving from lying on your back to sitting on the side of a flat bed without using bedrails?: Total Help needed moving to and from a bed to a chair (including a wheelchair)?: Total Help needed standing up from a chair using your arms (e.g., wheelchair  or bedside chair)?: Total Help needed to walk in hospital room?: Total Help needed climbing 3-5 steps with a railing? : Total 6 Click Score: 6    End of Session   Activity Tolerance: Patient limited by pain;Patient limited by fatigue Patient left: in bed;with call bell/phone within reach Nurse Communication: Mobility status (in chair position.) PT Visit Diagnosis: Other abnormalities of gait and mobility (R26.89);History of falling (Z91.81);Muscle weakness (generalized) (M62.81);Pain Pain - part of body:  (buttocks)     Time: 8595-8582 PT Time Calculation (min) (ACUTE ONLY): 13 min  Charges:    $Therapeutic Activity: 8-22 mins PT General Charges $$ ACUTE PT VISIT: 1 Visit                     Toya HAMS , PTA Acute Rehabilitation Services Office [phone]    Nikita Humble JINNY Gosling 05/22/2024, 2:26 PM

## 2024-05-22 NOTE — Assessment & Plan Note (Addendum)
 Remains hemodynamically stable on antibiotics.  No need for debridement of ulcer per orthopedics and general surgery. Pelvic MRI has been ordered to assess extent into the lumbar spine as well as any involvement of lumbar discitis.  Blood cultures reassuringly negative to date. -Continue Unasyn  with transition to p.o. Augmentin  pending continued clinical improvement - Continue home Tylenol , decreased home tramadol  due to worsening renal function - Continue Dilaudid  1 mg every 4 as needed for severe pain - Continue pressure-relief bedding, turning patient for 2 hours, PT/OT, wound care

## 2024-05-22 NOTE — Plan of Care (Signed)

## 2024-05-22 NOTE — Assessment & Plan Note (Signed)
 Patient has had coordination and balance issues with increasing age and her history of stroke.  Recent hospitalization and rehab stay further compounding picture with profound deconditioning.  Patient fell at home and endorses pain in the left hip that radiates upward.  No bruising of the hip noted on exam.  No acute fractures other than sacral fracture as above noted on x-ray. - Pain control as above - Lidocaine  patch over left hip - PT and OT as above

## 2024-05-22 NOTE — Assessment & Plan Note (Signed)
 Hgb ~9 at baseline, has trended down during admission to 7.2 7/11. Given hx of CAD, transfusion threshold <8. -Most likely multifactorial, including anemia of chronic disease, CKD, active inflammatory process.  -Obtained consent for blood transfusion and administered one unit pRBCs 7/11.

## 2024-05-22 NOTE — Assessment & Plan Note (Signed)
 Kidney function appears to be worsening on chart review, prior baseline ~1.8 in March 2025.  Creatinine 2.35 7/8, GFR 20.  Mucous membranes dry on exam.  Patient has received roughly 1 L of fluids between bolus and IV antibiotics in the ED. - Creatinine 2.33 7/11, 2.39 7/10, 2.28 7/9. - Encourage p.o. intake - Can consider additional fluid's as needed, caution 2/2 CHF

## 2024-05-22 NOTE — Assessment & Plan Note (Signed)
 In the context of profound deconditioning.  Imaging this admission with sacral fracture as below.  She has been working with PT and OT. -Continue pain control with orals and lidocaine  patches - PT/OT

## 2024-05-22 NOTE — Assessment & Plan Note (Signed)
 Glucoses controlled on sensitive sliding scale insulin . -Continue SSI

## 2024-05-23 DIAGNOSIS — R710 Precipitous drop in hematocrit: Secondary | ICD-10-CM | POA: Diagnosis not present

## 2024-05-23 DIAGNOSIS — L89154 Pressure ulcer of sacral region, stage 4: Secondary | ICD-10-CM | POA: Diagnosis not present

## 2024-05-23 DIAGNOSIS — M4628 Osteomyelitis of vertebra, sacral and sacrococcygeal region: Secondary | ICD-10-CM | POA: Diagnosis not present

## 2024-05-23 LAB — RETICULOCYTES
Immature Retic Fract: 22.3 % — ABNORMAL HIGH (ref 2.3–15.9)
RBC.: 3.11 MIL/uL — ABNORMAL LOW (ref 3.87–5.11)
Retic Count, Absolute: 46.7 K/uL (ref 19.0–186.0)
Retic Ct Pct: 1.5 % (ref 0.4–3.1)

## 2024-05-23 LAB — TYPE AND SCREEN
ABO/RH(D): O POS
Antibody Screen: NEGATIVE
Unit division: 0

## 2024-05-23 LAB — CBC
HCT: 27.2 % — ABNORMAL LOW (ref 36.0–46.0)
Hemoglobin: 8.7 g/dL — ABNORMAL LOW (ref 12.0–15.0)
MCH: 28 pg (ref 26.0–34.0)
MCHC: 32 g/dL (ref 30.0–36.0)
MCV: 87.5 fL (ref 80.0–100.0)
Platelets: 247 K/uL (ref 150–400)
RBC: 3.11 MIL/uL — ABNORMAL LOW (ref 3.87–5.11)
RDW: 20.2 % — ABNORMAL HIGH (ref 11.5–15.5)
WBC: 7.3 K/uL (ref 4.0–10.5)
nRBC: 0 % (ref 0.0–0.2)

## 2024-05-23 LAB — GLUCOSE, CAPILLARY
Glucose-Capillary: 120 mg/dL — ABNORMAL HIGH (ref 70–99)
Glucose-Capillary: 138 mg/dL — ABNORMAL HIGH (ref 70–99)
Glucose-Capillary: 120 mg/dL — ABNORMAL HIGH (ref 70–99)
Glucose-Capillary: 146 mg/dL — ABNORMAL HIGH (ref 70–99)

## 2024-05-23 LAB — BASIC METABOLIC PANEL WITH GFR
Anion gap: 4 — ABNORMAL LOW (ref 5–15)
BUN: 55 mg/dL — ABNORMAL HIGH (ref 8–23)
CO2: 24 mmol/L (ref 22–32)
Calcium: 9.1 mg/dL (ref 8.9–10.3)
Chloride: 105 mmol/L (ref 98–111)
Creatinine, Ser: 2.24 mg/dL — ABNORMAL HIGH (ref 0.44–1.00)
GFR, Estimated: 21 mL/min — ABNORMAL LOW (ref >60)
Glucose, Bld: 133 mg/dL — ABNORMAL HIGH (ref 70–99)
Potassium: 3.7 mmol/L (ref 3.5–5.1)
Sodium: 133 mmol/L — ABNORMAL LOW (ref 135–145)

## 2024-05-23 LAB — BPAM RBC
Blood Product Expiration Date: 202508092359
ISSUE DATE / TIME: 202507111454
Unit Type and Rh: 5100

## 2024-05-23 NOTE — Plan of Care (Signed)

## 2024-05-23 NOTE — Progress Notes (Signed)
 CSW spoke with [name] in admissions who confirmed authorization is approved. Patient can admit to [facility] tomorrow. Patient will be going to room 212.

## 2024-05-24 DIAGNOSIS — Z751 Person awaiting admission to adequate facility elsewhere: Secondary | ICD-10-CM | POA: Diagnosis not present

## 2024-05-24 DIAGNOSIS — L304 Erythema intertrigo: Secondary | ICD-10-CM | POA: Insufficient documentation

## 2024-05-24 LAB — CULTURE, BLOOD (ROUTINE X 2)
Culture: NO GROWTH
Culture: NO GROWTH

## 2024-05-24 LAB — GLUCOSE, CAPILLARY
Glucose-Capillary: 110 mg/dL — ABNORMAL HIGH (ref 70–99)
Glucose-Capillary: 202 mg/dL — ABNORMAL HIGH (ref 70–99)
Glucose-Capillary: 150 mg/dL — ABNORMAL HIGH (ref 70–99)
Glucose-Capillary: 168 mg/dL — ABNORMAL HIGH (ref 70–99)

## 2024-05-24 LAB — CBC
HCT: 24.7 % — ABNORMAL LOW (ref 36.0–46.0)
Hemoglobin: 7.8 g/dL — ABNORMAL LOW (ref 12.0–15.0)
MCH: 27.9 pg (ref 26.0–34.0)
MCHC: 31.6 g/dL (ref 30.0–36.0)
MCV: 88.2 fL (ref 80.0–100.0)
Platelets: 231 K/uL (ref 150–400)
RBC: 2.8 MIL/uL — ABNORMAL LOW (ref 3.87–5.11)
RDW: 20.3 % — ABNORMAL HIGH (ref 11.5–15.5)
WBC: 5.5 K/uL (ref 4.0–10.5)
nRBC: 0 % (ref 0.0–0.2)

## 2024-05-24 LAB — HEMOGLOBIN AND HEMATOCRIT, BLOOD
HCT: 25.7 % — ABNORMAL LOW (ref 36.0–46.0)
Hemoglobin: 8.3 g/dL — ABNORMAL LOW (ref 12.0–15.0)

## 2024-05-24 MED ORDER — SENNA 8.6 MG PO TABS: 1.0000 | ORAL_TABLET | Freq: Every day | ORAL | Status: DC | PRN

## 2024-05-24 MED ORDER — AQUAPHOR EX OINT: TOPICAL_OINTMENT | Freq: Every day | CUTANEOUS | Status: DC | PRN

## 2024-05-24 MED ORDER — TRIAMCINOLONE ACETONIDE 0.1 % EX OINT: TOPICAL_OINTMENT | Freq: Two times a day (BID) | CUTANEOUS | Status: DC

## 2024-05-24 MED ORDER — AMOXICILLIN-POT CLAVULANATE 875-125 MG PO TABS: 1.0000 | ORAL_TABLET | Freq: Two times a day (BID) | ORAL | Status: DC

## 2024-05-24 MED ORDER — FERROUS SULFATE 325 (65 FE) MG PO TABS
325.0000 mg | ORAL_TABLET | Freq: Every day | ORAL | Status: DC
Start: 2024-05-25 — End: 2024-06-17

## 2024-05-24 MED ORDER — OXYCODONE HCL 5 MG PO TABS
10.0000 mg | ORAL_TABLET | ORAL | Status: DC | PRN
  Administered 2024-05-24 – 2024-05-25 (×2): 10 mg via ORAL
  Filled 2024-05-24 (×2): qty 2

## 2024-05-24 MED ORDER — OXYCODONE HCL 5 MG PO TABS
5.0000 mg | ORAL_TABLET | ORAL | Status: DC | PRN
  Administered 2024-05-24: 5 mg via ORAL
  Filled 2024-05-24 (×2): qty 1

## 2024-05-24 MED ORDER — POLYETHYLENE GLYCOL 3350 17 G PO PACK
17.0000 g | PACK | Freq: Every day | ORAL | Status: DC | PRN
Start: 2024-05-24 — End: 2024-06-17

## 2024-05-24 MED ORDER — AMOXICILLIN-POT CLAVULANATE 500-125 MG PO TABS
1.0000 | ORAL_TABLET | Freq: Two times a day (BID) | ORAL | Status: DC
  Administered 2024-05-24 – 2024-05-25 (×2): 1 via ORAL
  Filled 2024-05-24 (×3): qty 1

## 2024-05-24 MED ORDER — OXYCODONE HCL 5 MG PO TABS: 5.0000 mg | ORAL_TABLET | ORAL | 0 refills | Status: AC | PRN

## 2024-05-24 MED ORDER — NYSTATIN 100000 UNIT/GM EX POWD
1.0000 | Freq: Three times a day (TID) | CUTANEOUS | Status: DC
  Administered 2024-05-24 – 2024-05-25 (×4): 1 via TOPICAL
  Filled 2024-05-24: qty 15

## 2024-05-24 MED ORDER — NYSTATIN 100000 UNIT/GM EX POWD
1.0000 | Freq: Three times a day (TID) | CUTANEOUS | Status: DC
Start: 2024-05-24 — End: 2024-06-17

## 2024-05-24 NOTE — Assessment & Plan Note (Signed)
 In the context of profound deconditioning.  Imaging this admission with sacral fracture as below.  She has been working with PT and OT. -Continue pain control with orals and lidocaine  patches - PT/OT

## 2024-05-24 NOTE — Assessment & Plan Note (Addendum)
 Recent echo March 2025 with LVEF 35 to 40%.  Does not appear volume overloaded.  Blood pressure is mildly improved/normotensive over the last 24 hours.  Continue to monitor -Continue to hold metoprolol  and torsemide  given soft pressures, reinitiate as needed - Continue triamcinolone  ointment for dermatitis

## 2024-05-24 NOTE — Plan of Care (Signed)

## 2024-05-24 NOTE — Assessment & Plan Note (Addendum)
 Stable. -Continue SSI

## 2024-05-24 NOTE — Progress Notes (Signed)
 Received patient from previous nurse. Patient resting in bed. Patient not in acute distress. Safety measures in place. Will continue to follow plan of care.

## 2024-05-24 NOTE — Assessment & Plan Note (Addendum)
 Remains hemodynamically stable.  Continue plan as below. -Continue Unasyn  with transition to p.o. Augmentin  pending continued clinical improvement - Continue home Tylenol , decreased home tramadol  due to worsening renal function - Continue Dilaudid  1 mg every 4 as needed for severe pain - Continue pressure-relief bedding, turning patient for 2 hours, PT/OT, wound care

## 2024-05-24 NOTE — Progress Notes (Signed)
 PHARMACY NOTE:  ANTIMICROBIAL RENAL DOSAGE ADJUSTMENT  Current antimicrobial regimen includes a mismatch between antimicrobial dosage and estimated renal function.  As per policy approved by the Pharmacy & Therapeutics and Medical Executive Committees, the antimicrobial dosage will be adjusted accordingly.  Current antimicrobial dosage:  Augmentin  875/125 mg BID  Indication: sacral osteomyelitis / Stage IV pressure ulcer of sacral region  Renal Function: Estimated Creatinine Clearance: 21.5 mL/min (A) (by C-G formula based on SCr of 2.24 mg/dL (H)). Not on HD.     Antimicrobial dosage has been changed to: Augmentin  500/125 mg BID  Thank you for allowing pharmacy to be a part of this patient's care.  Maurilio Patten, PharmD PGY1 Pharmacy Resident [facility]  05/24/2024 11:35 AM

## 2024-05-24 NOTE — Assessment & Plan Note (Signed)
 Patient complaining of pain in intertriginous folds this morning.  On exam there are small skin tears present with erythema and satellite lesions consistent with intertrigo. - Continue barrier cream as needed - Nystatin  powder 3 times daily as needed to appropriate areas

## 2024-05-24 NOTE — Assessment & Plan Note (Addendum)
 Stable s/p 1 unit PRBCs.  A.m. labs pending will follow-up as able -AM CBC, transfuse as needed for hemoglobin less than 8 -If continues to downtrend, or if FOBT positive, will have a conversation with patient about continuing anticoagulation and consulting GI -Question need for EPO long-term per nephrology

## 2024-05-24 NOTE — Progress Notes (Signed)
 Daily Progress Note Intern Pager: [phone]  Patient name: [name] Medical record number: [id number] Date of birth: Jun 16, 1941 Age: 83 y.o. Gender: female  Primary Care Provider: Tharon Lung, MD Consultants: Ortho, wound care, ID Code Status: Full  Pt Overview and Major Events to Date:  7/9: Admitted  Assessment and Plan:  This is an 83 year old female patient presenting with a fall likely due to did deconditioning and pre-existing large stage IV sacral ulcer overlying prior sacral fracture with CT MRI findings concerning for osteomyelitis. Assessment & Plan Fall In the context of profound deconditioning.  Imaging this admission with sacral fracture as below.  She has been working with PT and OT. -Continue pain control with orals and lidocaine  patches - PT/OT Sacral osteomyelitis (HCC) Stage IV pressure ulcer of sacral region (HCC) Remains hemodynamically stable.  Continue plan as below. -Continue Unasyn  with transition to p.o. Augmentin  pending continued clinical improvement - Continue home Tylenol , decreased home tramadol  due to worsening renal function - Continue Dilaudid  1 mg every 4 as needed for severe pain - Continue pressure-relief bedding, turning patient for 2 hours, PT/OT, wound care Acute kidney injury superimposed on stage 4 chronic kidney disease (HCC) Creatinine steadily improving.  Continue to monitor. -Continue to encourage p.o. intake - AM CMP -Outpatient follow-up with nephrologist CHF (congestive heart failure) (HCC) Venous stasis dermatitis of both lower extremities Recent echo March 2025 with LVEF 35 to 40%.  Does not appear volume overloaded.  Blood pressure is mildly improved/normotensive over the last 24 hours.  Continue to monitor -Continue to hold metoprolol  and torsemide  given soft pressures, reinitiate as needed - Continue triamcinolone  ointment for dermatitis Type 2 diabetes mellitus (HCC) Stable. -Continue SSI Normocytic  anemia Stable s/p 1 unit PRBCs.  A.m. labs pending will follow-up as able -AM CBC, transfuse as needed for hemoglobin less than 8 -If continues to downtrend, or if FOBT positive, will have a conversation with patient about continuing anticoagulation and consulting GI -Question need for EPO long-term per nephrology Intertrigo Patient complaining of pain in intertriginous folds this morning.  On exam there are small skin tears present with erythema and satellite lesions consistent with intertrigo. - Continue barrier cream as needed - Nystatin  powder 3 times daily as needed to appropriate areas  FEN/GI: Renal PPx: Home Eliquis  Dispo:SNF pending clinical improvement .   Subjective:  Patient is awake and alert complaining of pain and her skin tears as noted above.  Objective: Temp:  [97.4 F (36.3 C)-98.3 F (36.8 C)] 98.3 F (36.8 C) (07/13 0443) Pulse Rate:  [62-94] 86 (07/13 0443) Resp:  [17-19] 18 (07/13 0443) BP: (104-130)/(46-91) 130/74 (07/13 0443) SpO2:  [97 %-100 %] 97 % (07/13 0443) Physical Exam: General: Elderly appearing, no distress Cardiovascular: RRR, no M/R/G Respiratory: CTAB, no increased work of breathing Abdomen: Flat, soft, nontender Extremities: 2+ pulses bilaterally, no evidence of peripheral edema  Laboratory: Most recent CBC Lab Results  Component Value Date   WBC 7.3 05/23/2024   HGB 8.7 (L) 05/23/2024   HCT 27.2 (L) 05/23/2024   MCV 87.5 05/23/2024   PLT 247 05/23/2024   Most recent BMP    Latest Ref Rng & Units 05/23/2024    2:24 AM  BMP  Glucose 70 - 99 mg/dL 866   BUN 8 - 23 mg/dL 55   Creatinine 9.55 - 1.00 mg/dL 7.75   Sodium 864 - 854 mmol/L 133   Potassium 3.5 - 5.1 mmol/L 3.7   Chloride 98 - 111  mmol/L 105   CO2 22 - 32 mmol/L 24   Calcium  8.9 - 10.3 mg/dL 9.1     Cleotilde Lukes, DO 05/24/2024, 5:40 AM  PGY-2, [facility] FPTS Intern pager: [phone], text pages welcome Secure chat group [facility] Teaching Service

## 2024-05-24 NOTE — TOC Progression Note (Addendum)
 Transition of Care [facility]) - Progression Note    Patient Details  Name: [name] MRN: [id number] Date of Birth: [birth year]  Transition of Care [facility]) CM/SW Contact  Dino CHRISTELLA Au, LCSWA Phone Number: 05/24/2024, 11:17 AM  Clinical Narrative:     Per MD pt not medically ready today.   SW updated [employer] [phone])    Expected Discharge Plan: Skilled Nursing Facility Barriers to Discharge: Continued Medical Work up, English as a second language teacher  Expected Discharge Plan and Services     Post Acute Care Choice: Skilled Nursing Facility Living arrangements for the past 2 months: Skilled Nursing Facility, Single Family Home                                       Social Determinants of Health (SDOH) Interventions SDOH Screenings   Food Insecurity: No Food Insecurity (05/20/2024)  Housing: Low Risk  (05/20/2024)  Transportation Needs: No Transportation Needs (05/20/2024)  Utilities: Not At Risk (05/20/2024)  Alcohol Screen: Low Risk  (05/07/2024)  Depression (PHQ2-9): Low Risk  (05/07/2024)  Financial Resource Strain: Low Risk  (05/07/2024)  Physical Activity: Inactive (05/07/2024)  Social Connections: Socially Isolated (05/20/2024)  Stress: No Stress Concern Present (05/07/2024)  Tobacco Use: Low Risk  (05/19/2024)  Health Literacy: Inadequate Health Literacy (05/07/2024)    Readmission Risk Interventions     No data to display

## 2024-05-24 NOTE — Assessment & Plan Note (Addendum)
 Creatinine steadily improving.  Continue to monitor. -Continue to encourage p.o. intake - AM CMP -Outpatient follow-up with nephrologist

## 2024-05-24 NOTE — Discharge Instructions (Addendum)
 Dear [name] Foot,  Thank you for letting us  participate in your care. You were hospitalized for a fall and diagnosed with a fracture to the sacral region of your spine; you were also found to have a significant pressure ulcer of your back which was infected down to the bone. You were treated with antibiotics for your infection and pain medications for your fracture.  POST-HOSPITAL & CARE INSTRUCTIONS Continue taking all your medications as prescribed, including your antibiotic (Augmentin ) Go to your follow up appointments (listed below)   DOCTOR'S APPOINTMENT   Future Appointments  Date Time Provider Department Center  06/22/2024  9:00 AM [facility] ECHO OP 1 [facility]  06/22/2024 10:00 AM Rolan Ezra RAMAN, MD [facility] None  05/10/2025  2:20 PM [facility] ANNUAL WELLNESS VISIT [facility]    Contact information for after-discharge care     Destination     [facility] SNF .   Service: Skilled Nursing Contact information: [address] [place]   [phone]                     Take care and be well!  Family Medicine Teaching Service Inpatient Team   [facility]  [address] [place], [place] [address] [phone]

## 2024-05-25 DIAGNOSIS — S3210XD Unspecified fracture of sacrum, subsequent encounter for fracture with routine healing: Secondary | ICD-10-CM | POA: Diagnosis not present

## 2024-05-25 DIAGNOSIS — E785 Hyperlipidemia, unspecified: Secondary | ICD-10-CM | POA: Diagnosis not present

## 2024-05-25 DIAGNOSIS — R1319 Other dysphagia: Secondary | ICD-10-CM | POA: Diagnosis not present

## 2024-05-25 DIAGNOSIS — E119 Type 2 diabetes mellitus without complications: Secondary | ICD-10-CM | POA: Diagnosis not present

## 2024-05-25 DIAGNOSIS — I509 Heart failure, unspecified: Secondary | ICD-10-CM | POA: Diagnosis not present

## 2024-05-25 DIAGNOSIS — F29 Unspecified psychosis not due to a substance or known physiological condition: Secondary | ICD-10-CM | POA: Diagnosis not present

## 2024-05-25 DIAGNOSIS — M7060 Trochanteric bursitis, unspecified hip: Secondary | ICD-10-CM | POA: Diagnosis not present

## 2024-05-25 DIAGNOSIS — Z7409 Other reduced mobility: Secondary | ICD-10-CM | POA: Diagnosis not present

## 2024-05-25 DIAGNOSIS — M4156 Other secondary scoliosis, lumbar region: Secondary | ICD-10-CM | POA: Diagnosis not present

## 2024-05-25 DIAGNOSIS — N184 Chronic kidney disease, stage 4 (severe): Secondary | ICD-10-CM | POA: Diagnosis not present

## 2024-05-25 DIAGNOSIS — Z7401 Bed confinement status: Secondary | ICD-10-CM | POA: Diagnosis not present

## 2024-05-25 DIAGNOSIS — F0390 Unspecified dementia without behavioral disturbance: Secondary | ICD-10-CM | POA: Diagnosis not present

## 2024-05-25 DIAGNOSIS — M4856XD Collapsed vertebra, not elsewhere classified, lumbar region, subsequent encounter for fracture with routine healing: Secondary | ICD-10-CM | POA: Diagnosis not present

## 2024-05-25 DIAGNOSIS — M4628 Osteomyelitis of vertebra, sacral and sacrococcygeal region: Secondary | ICD-10-CM | POA: Diagnosis not present

## 2024-05-25 DIAGNOSIS — R41841 Cognitive communication deficit: Secondary | ICD-10-CM | POA: Diagnosis not present

## 2024-05-25 DIAGNOSIS — M6281 Muscle weakness (generalized): Secondary | ICD-10-CM | POA: Diagnosis not present

## 2024-05-25 DIAGNOSIS — R4182 Altered mental status, unspecified: Secondary | ICD-10-CM | POA: Diagnosis not present

## 2024-05-25 DIAGNOSIS — L89154 Pressure ulcer of sacral region, stage 4: Secondary | ICD-10-CM | POA: Diagnosis not present

## 2024-05-25 DIAGNOSIS — Z741 Need for assistance with personal care: Secondary | ICD-10-CM | POA: Diagnosis not present

## 2024-05-25 LAB — CBC
HCT: 26.8 % — ABNORMAL LOW (ref 36.0–46.0)
Hemoglobin: 8.4 g/dL — ABNORMAL LOW (ref 12.0–15.0)
MCH: 27.4 pg (ref 26.0–34.0)
MCHC: 31.3 g/dL (ref 30.0–36.0)
MCV: 87.3 fL (ref 80.0–100.0)
Platelets: 242 K/uL (ref 150–400)
RBC: 3.07 MIL/uL — ABNORMAL LOW (ref 3.87–5.11)
RDW: 20.2 % — ABNORMAL HIGH (ref 11.5–15.5)
WBC: 6.2 K/uL (ref 4.0–10.5)
nRBC: 0 % (ref 0.0–0.2)

## 2024-05-25 LAB — GLUCOSE, CAPILLARY
Glucose-Capillary: 209 mg/dL — ABNORMAL HIGH (ref 70–99)
Glucose-Capillary: 202 mg/dL — ABNORMAL HIGH (ref 70–99)

## 2024-05-25 MED ORDER — CHOLECALCIFEROL 25 MCG (1000 UT) PO TABS: 1000.0000 [IU] | ORAL_TABLET | Freq: Every day | ORAL | Status: DC

## 2024-05-25 MED ORDER — NEPRO/CARBSTEADY PO LIQD
237.0000 mL | Freq: Two times a day (BID) | ORAL | Status: DC
  Administered 2024-05-25: 237 mL via ORAL

## 2024-05-25 MED ORDER — NEPRO/CARBSTEADY PO LIQD: 237.0000 mL | Freq: Two times a day (BID) | ORAL | Status: DC

## 2024-05-25 MED ORDER — AMOXICILLIN-POT CLAVULANATE 500-125 MG PO TABS: 1.0000 | ORAL_TABLET | Freq: Two times a day (BID) | ORAL | Status: AC

## 2024-05-25 NOTE — Discharge Summary (Addendum)
 Family Medicine Teaching [facility] Discharge Summary  Patient name: [name] Medical record number: [id number] Date of birth: 01/25/1941 Age: 83 y.o. Gender: female Date of Admission: 05/19/2024  Date of Discharge: 05/25/24 Admitting Physician: Raguel KANDICE Lee, DO  Primary Care Provider: Tharon Lung, MD Consultants: ID, Ortho, Wound Care  Indication for Hospitalization: Sacral Osteomyelitis  Discharge Diagnoses/Problem List:  Principal Problem for Admission: Sacral Osteomyelitis Other Problems addressed during stay:  Principal Problem:   Person awaiting admission to adequate facility elsewhere Active Problems:   CHF (congestive heart failure) (HCC)   Type 2 diabetes mellitus (HCC)   Fall   Acute kidney injury superimposed on stage 4 chronic kidney disease (HCC)   Venous stasis dermatitis of both lower extremities   Sacral osteomyelitis (HCC)   Confined to bed   Stage IV pressure ulcer of sacral region (HCC)   Normocytic anemia   Hemoglobin drop   Chronic kidney disease, stage 4 (severe) (HCC)   Intertrigo  Brief Hospital Course:  [name] is a 83 y.o. female who was admitted to the [facility] Medicine Teaching Service at [facility] for infected sacral ulcer. Hospital course is outlined below by problem.   Sacral osteomyelitis 2/2 stage IV sacral decubitus pressure ulcer Presents from SNF with stage IV sacral ulcer with surrounding erythema and drainage. MRI Pelvis showed active osteomyelitis of sacral region; also suggesting possible osteo of vertebrae and so MRI lumbar spine done which showed prior osteomyelitis discitis at L2-3 with no active/recurrent disease. General surgery was consulted, did not recommend debridement.  Initiated on empiric antibiotics, ID was consulted and recommended narrowing to Unasyn  which was transitioned to PO Augmentin  x 2-week course.  Antibiotic course: - IV Cefepime  7/8, IV Vancomycin  7/8, IV Flagyl  7/8-7/9 - IV Unasyn  7/9-7/13 - PO  Augmentin  500-125 mg twice daily (7/13-7/29)  Wound care: Saline WTD dressings Q12 hours, cover with dry gauze.  Fall Due to deconditioning given multiple comorbidities and difficult ambulation at baseline. CT head reassuringly negative.  CTAP with sacral fracture; otherwise pelvic radiographs without abnormality.  Normocytic Anemia Initial hemoglobin 9.5, but down trended to 7.2 and patient received 1 unit of pRBCs given cardiac history with improvement. Hemoglobin at time of discharge was stable at 8.4. Patient was continued on Eliquis  during hospitalization given high risk of stroke with CHA2DS2-VASc of 9.  CHF Patient without evidence of hypervolemia during stay; metoprolol  and torsemide  held given low-normal blood pressures.  Other conditions that were chronic and stable: T2DM, intertrigo  Issues for follow up: Continue wound care and refer to Wound Care Center per General Surgery Home torsemide  and blood pressure medicines held in the setting of persistent borderline hypotension Repeat CBC, BMP 1 week Evaluate need for ongoing nystatin  therapy for intertrigo Evaluate need for ongoing triamcinolone  ointment for venous stasis dermatitis  Results/Tests Pending at Time of Discharge:  Unresulted Labs (From admission, onward)     Start     Ordered   05/22/24 1547  Occult blood card to lab, stool  Once,   R        05/22/24 1554   05/20/24 0915  Urea nitrogen, urine  Once,   R        05/20/24 0914   05/20/24 0915  Creatinine, urine, random  Once,   R        05/20/24 0914            Disposition: SNF  Discharge Condition: Ongoing sacral osteomyelitis, suspected chronic, on Augmentin  until 7/29.  Discharge Exam:  Vitals:   05/25/24 0749 05/25/24 1211  BP: (!) 107/51 (!) 99/47  Pulse: 84 82  Resp: 19 18  Temp: (!) 97.5 F (36.4 C) 98.1 F (36.7 C)  SpO2: 100% 100%   General: Awake, alert, no acute distress.  Cardio: RRR, no m/r/g. 2+ radial and dorsalis pedis pulses  b/l.  Resp: CTA b/l, no increased work of breathing.  Abdomen: Soft, non tender, non distended. Skin: Stage IV, fist sized sacral ulcer.  Neuro: Aox4, no focal deficits. Psych: Appropriate mood and affect  Significant Procedures: 1 unit pRBCs given 7/11  Significant Labs and Imaging:  Recent Labs  Lab 05/24/24 1029 05/24/24 1219 05/25/24 0328  WBC 5.5  --  6.2  HGB 7.8* 8.3* 8.4*  HCT 24.7* 25.7* 26.8*  PLT 231  --  242   7/12 BMP w/ Na 133, K 3.7, Glucose 133, Cr 2.24, AG 4 7/12 Retic frac 22.3   Impression from Radiologist:  CT head without contrast: 1. No acute intracranial abnormality related to the reported head trauma. 2. Stable remote infarcts involving the posterior right insular cortex, right lentiform nucleus, and right caudate head. 3. Chronic advanced atrophy and white matter disease is stable.   Left hip with pelvis x-ray: The sacrum is partially obscured by fecal material in the rectum. Otherwise, no acute, displaced fracture or dislocation present in either hip. As underlying osteopenia decreases the sensitivity for nondisplaced fracture using plain radiography, if the patient is unable to bear weight, or there is high clinical suspicion, a follow-up CT should be considered for further evaluation.    Right hip with pelvis x-ray: The sacrum is partially obscured by fecal material in the rectum. Otherwise, no acute, displaced fracture or dislocation present in either hip. As underlying osteopenia decreases the sensitivity for nondisplaced fracture using plain radiography, if the patient is unable to bear weight, or there is high clinical suspicion, a follow-up CT should be considered for further evaluation.   CT abdomen pelvis without contrast: 1. Deep sacral ulcer extending to the level of the distal sacrum. Focal area of sacral fracture and fragmentation with adjacent air may represent a traumatic fracture or osteomyelitis. No drainable fluid  collection or abscess. 2. Partially visualized small bilateral pleural effusions with partial compressive atelectasis of the lower lobes. 3. Cholelithiasis. 4.  Aortic Atherosclerosis (ICD10-I70.0).  Renal US  IMPRESSION: Right kidney multiple cortical cysts . Previously seen hyperdense/complex subcentimeter right renal cysts on prior CT is not well seen on current exam due to overlying bowel gas. Layering debris/blood products within the bladder lumen. Correlate with urinalysis. Increased echogenicity Cholelithiasis partially visualized.  MRI Pelvis IMPRESSION: 1. Large sacral decubitus ulcer just to the left of midline extending to the upper coccygeal segment where there is active osteomyelitis. 2. Accentuated T2 signal in the intervertebral disc at the L3-4, L4-5, and L5-S1 levels although I am skeptical of discitis-osteomyelitis based on the lack of bony destructive findings along the endplates. Please note that the lumbar spine was not assessed in a comprehensive manner on today's pelvic MRI. 3. Mild bilateral trochanteric bursitis, left greater than right. 4. Partial tearing of both hamstring tendons proximally. 5. Fluid-fluid level in the urinary bladder suggesting either blood products debris. Correlate with urine analysis. 6. Mild diffuse subcutaneous edema likely from third spacing of fluids. 7. Dextroconvex lumbar scoliosis with chronic compression fracture at L3.  MRI Lumbar IMPRESSION: 1. Sequelae of prior osteomyelitis discitis at L2-3. Progressive height loss at the superior endplate of L3  now measures up to 50% with 5 mm bony retropulsion. No significant abnormal marrow edema or paraspinous inflammatory changes at this time to suggest active or recurrent infection. 2. No other acute infection elsewhere within the lumbar spine. 3. Underlying moderate to advanced multilevel degenerative spondylosis and facet arthrosis with resultant moderate to severe spinal  stenosis at L2-3 through L4-5. Associated moderate to severe bilateral L2 through L5 foraminal narrowing as above.   Discharge Medications:  Allergies as of 05/25/2024       Reactions   Benazepril Other (See Comments)   Unknown reaction at age 28-65 - possibly dizziness   Cymbalta  [duloxetine  Hcl] Other (See Comments)   Itch, Dry mouth    Ozempic  (0.25 Or 0.5 Mg-dose) [semaglutide (0.25 Or 0.5mg -dos)] Diarrhea   Gi intollerance   Tape Other (See Comments)   TOLERATES PAPER TAPE ONLY- due to thin skin. NOT ALLERGY PER PT   Angiotensin Receptor Blockers Other (See Comments)   Hypotension reaction   Bactrim [sulfamethoxazole-trimethoprim] Other (See Comments)   Unknown reaction   Fe-succ-c-thre-b12-des Stomach Other (See Comments)   Unknown reaction Ferocon?        Medication List     PAUSE taking these medications    megestrol  40 MG tablet Wait to take this until your doctor or other care provider tells you to start again. Commonly known as: MEGACE  Take 1 tablet (40 mg total) by mouth daily.   metoprolol  succinate 25 MG 24 hr tablet Wait to take this until your doctor or other care provider tells you to start again. Commonly known as: TOPROL -XL Take 12.5 mg by mouth daily.   potassium chloride  SA 20 MEQ tablet Wait to take this until your doctor or other care provider tells you to start again. Commonly known as: KLOR-CON  M Take 1 tablet (20 mEq total) by mouth daily.   torsemide  20 MG tablet Wait to take this until your doctor or other care provider tells you to start again. Commonly known as: DEMADEX  Take 40 mg by mouth daily.       STOP taking these medications    metolazone  2.5 MG tablet Commonly known as: ZAROXOLYN    zinc  oxide 20 % ointment       TAKE these medications    acetaminophen  650 MG CR tablet Commonly known as: Tylenol  8 Hour Take 1 tablet (650 mg total) by mouth every 8 (eight) hours as needed for pain.   amiodarone  200 MG  tablet Commonly known as: PACERONE  Take 1 tablet (200 mg total) by mouth daily.   amoxicillin -clavulanate 500-125 MG tablet Commonly known as: AUGMENTIN  Take 1 tablet by mouth every 12 (twelve) hours for 30 doses.   apixaban  2.5 MG Tabs tablet Commonly known as: Eliquis  Take 1 tablet (2.5 mg total) by mouth 2 (two) times daily.   atorvastatin  40 MG tablet Commonly known as: LIPITOR Take 1 tablet (40 mg total) by mouth daily.   brimonidine  0.2 % ophthalmic solution Commonly known as: ALPHAGAN  Place 1 drop into both eyes 2 (two) times daily.   Cholecalciferol  25 MCG (1000 UT) tablet Take 1 tablet (1,000 Units total) by mouth daily.   feeding supplement (NEPRO CARB STEADY) Liqd Take 237 mLs by mouth 2 (two) times daily between meals.   ferrous sulfate  325 (65 FE) MG tablet Take 1 tablet (325 mg total) by mouth daily with breakfast. What changed:  how much to take how to take this when to take this additional instructions   lidocaine  5 % Commonly known as:  Lidoderm  Place 1 patch onto the skin daily. Remove & Discard patch within 12 hours or as directed by MD What changed:  when to take this reasons to take this additional instructions   mineral oil-hydrophilic petrolatum  ointment Apply topically daily as needed for dry skin (Dry lips).   nitroGLYCERIN  0.4 MG SL tablet Commonly known as: NITROSTAT  Place 1 tablet (0.4 mg total) under the tongue every 5 (five) minutes as needed for chest pain (up to 3 doses).   nystatin  powder Commonly known as: MYCOSTATIN /NYSTOP  Apply 1 Application topically 3 (three) times daily.   ONE TOUCH ULTRA TEST test strip Generic drug: glucose blood Use three times a day   glucose blood test strip Use as instructed to check blood glucose up to 4x daily   OneTouch Delica Plus Lancet33G Misc Please use to check blood sugar up to four times daily.   oxyCODONE  5 MG immediate release tablet Commonly known as: Oxy IR/ROXICODONE  Take 1  tablet (5 mg total) by mouth every 4 (four) hours as needed for up to 5 days for moderate pain (pain score 4-6).   polyethylene glycol 17 g packet Commonly known as: MIRALAX  / GLYCOLAX  Take 17 g by mouth daily as needed. What changed:  when to take this reasons to take this   PRESERVISION AREDS 2+MULTI VIT PO Take 1 capsule by mouth in the morning and at bedtime.   rOPINIRole  0.5 MG tablet Commonly known as: REQUIP  Take 2 tablets (1 mg total) by mouth at bedtime.   senna 8.6 MG Tabs tablet Commonly known as: SENOKOT Take 1 tablet (8.6 mg total) by mouth daily as needed for mild constipation. What changed:  when to take this reasons to take this   traMADol  50 MG tablet Commonly known as: ULTRAM  Take 1 tablet (50 mg total) by mouth every 12 (twelve) hours as needed for severe pain (pain score 7-10) (Can take every 6 hours for breakthrough pain.).   triamcinolone  ointment 0.1 % Commonly known as: KENALOG  Apply topically 2 (two) times daily.   Trulicity  1.5 MG/0.5ML Soaj Generic drug: Dulaglutide  Inject 1.5 mg into the skin once a week.               Discharge Care Instructions  (From admission, onward)           Start     Ordered   05/25/24 0000  Discharge wound care:       Comments: Wound care Every shift      Cleanse sacral wound with NS and pat dry. Apply wet saline bandage to wound bed, allow wet to dry. Top with dry gauze and sacral foam. Change each shift.   05/25/24 1108           Discharge Instructions: Please refer to Patient Instructions section of EMR for full details.  Patient was counseled important signs and symptoms that should prompt return to medical care, changes in medications, dietary instructions, activity restrictions, and follow up appointments.   Follow-Up Appointments:  Contact information for follow-up providers     Tharon Lung, MD Follow up.   Specialty: Family Medicine Why: 7/24 @ 1:50PM Contact information: [address]  [place] [address] [phone]              Contact information for after-discharge care     Destination     [facility] SNF .   Service: Skilled Nursing Contact information: [address] [place]  [phone]  Manon Jester, DO 05/25/2024, 1:10 PM PGY-1, [facility]   FMTS Upper-Level Resident Attestation I have independently interviewed and examined the patient. I have discussed the above with Dr. Manon and agree with the documented plan. My edits for correction/addition/clarification are included above. Please see any attending notes.  Marissia Blackham Toma, MD PGY-2, [facility] 05/25/2024 1:13 PM FPTS Service pager: [phone] (text pages welcome through [facility])

## 2024-05-25 NOTE — Assessment & Plan Note (Signed)
 Creatinine steadily improving.  Continue to monitor. -Continue to encourage p.o. intake - AM CMP -Outpatient follow-up with nephrologist

## 2024-05-25 NOTE — Progress Notes (Addendum)
  Secure chat sent to provider on call regarding tele order expiring. Awaiting reply Dressing to sacrum changed as ordered. Paper tape applied due to adhesive from foam tearing patients skin when being removed. Patient medicated before dressing change.   [phone] reply received from provider new order placed

## 2024-05-25 NOTE — Assessment & Plan Note (Signed)
 Recent echo March 2025 with LVEF 35 to 40%.  Does not appear volume overloaded.  Blood pressure is mildly improved/normotensive over the last 24 hours.  Continue to monitor -Continue to hold metoprolol  and torsemide  given soft pressures, reinitiate as needed - Continue triamcinolone  ointment for dermatitis

## 2024-05-25 NOTE — Plan of Care (Signed)

## 2024-05-25 NOTE — Assessment & Plan Note (Signed)
 Stable s/p 1 unit PRBCs.  A.m. labs pending will follow-up as able -AM CBC, transfuse as needed for hemoglobin less than 8 -If continues to downtrend, or if FOBT positive, will have a conversation with patient about continuing anticoagulation and consulting GI -Question need for EPO long-term per nephrology

## 2024-05-25 NOTE — TOC Transition Note (Signed)
 Transition of Care [facility]) - Discharge Note   Patient Details  Name: [name] MRN: [id number] Date of Birth: [birth year]  Transition of Care [facility]) CM/SW Contact:  Laina Guerrieri A [place], LCSW Phone Number: 05/25/2024, 12:49 PM   Clinical Narrative:     Patient will DC to: [facility]  Anticipated DC date: 05/25/24  Family notified: Lamar Fairy Foot  Transport by: [place]      Per MD patient ready for DC to [facility]. RN, patient, patient's family, and facility notified of DC. Discharge Summary and FL2 sent to facility. RN to call report prior to discharge (Room 212, [phone]). DC packet on chart. Ambulance transport requested for patient.     CSW will sign off for now as social work intervention is no longer needed. Please consult us  again if new needs arise.   Final next level of care: Skilled Nursing Facility Barriers to Discharge: Barriers Resolved   Patient Goals and CMS Choice Patient states their goals for this hospitalization and ongoing recovery are:: get rehab and get back home CMS Medicare.gov Compare Post Acute Care list provided to:: Patient Choice offered to / list presented to : Patient [place] [facility]provided to:: Patient    Discharge Placement              Patient chooses bed at:  [facility]) Patient to be transferred to facility by: [facility] Name of family member notified: Lamar Wisely Patient and family notified of of transfer: 05/25/24  Discharge Plan and Services Additional resources added to the After Visit Summary for       Post Acute Care Choice: Skilled Nursing Facility                               Social Drivers of Health (SDOH) Interventions SDOH Screenings   Food Insecurity: No Food Insecurity (05/20/2024)  Housing: Low Risk  (05/20/2024)  Transportation Needs: No Transportation Needs (05/20/2024)  Utilities: Not At Risk (05/20/2024)   Alcohol Screen: Low Risk  (05/07/2024)  Depression (PHQ2-9): Low Risk  (05/07/2024)  Financial Resource Strain: Low Risk  (05/07/2024)  Physical Activity: Inactive (05/07/2024)  Social Connections: Socially Isolated (05/20/2024)  Stress: No Stress Concern Present (05/07/2024)  Tobacco Use: Low Risk  (05/19/2024)  Health Literacy: Inadequate Health Literacy (05/07/2024)     Readmission Risk Interventions     No data to display

## 2024-05-25 NOTE — Assessment & Plan Note (Signed)
 Stable. -Continue SSI

## 2024-05-25 NOTE — Assessment & Plan Note (Signed)
 Remains hemodynamically stable.  Continue plan as below. -P.o. Augmentin  to total 2-3 of treatment per ID*** - Continue home Tylenol , decreased home tramadol  due to worsening renal function - Continue Dilaudid  1 mg every 4 as needed for severe pain - Continue pressure-relief bedding, turning patient for 2 hours, PT/OT, wound care

## 2024-05-25 NOTE — Assessment & Plan Note (Signed)
 In the context of profound deconditioning.  Imaging this admission with sacral fracture as below.  She has been working with PT and OT. -Continue pain control with orals and lidocaine  patches - PT/OT

## 2024-05-25 NOTE — Progress Notes (Shared)
 Daily Progress Note Intern Pager: [phone]  Patient name: [name] Medical record number: [id number] Date of birth: 11-10-1941 Age: 83 y.o. Gender: female  Primary Care Provider: Tharon Lung, MD Consultants: *** Code Status: ***  Pt Overview and Major Events to Date:  ***  Medical Decision Making:  ***. Pertinent PMH/PSH includes ***.  Assessment & Plan Fall In the context of profound deconditioning.  Imaging this admission with sacral fracture as below.  She has been working with PT and OT. -Continue pain control with orals and lidocaine  patches - PT/OT Sacral osteomyelitis (HCC) Stage IV pressure ulcer of sacral region (HCC) Remains hemodynamically stable.  Continue plan as below. -P.o. Augmentin  to total 2-3 of treatment per ID*** - Continue home Tylenol , decreased home tramadol  due to worsening renal function - Continue Dilaudid  1 mg every 4 as needed for severe pain - Continue pressure-relief bedding, turning patient for 2 hours, PT/OT, wound care Acute kidney injury superimposed on stage 4 chronic kidney disease (HCC) Creatinine steadily improving.  Continue to monitor. -Continue to encourage p.o. intake - AM CMP -Outpatient follow-up with nephrologist CHF (congestive heart failure) (HCC) Venous stasis dermatitis of both lower extremities Recent echo March 2025 with LVEF 35 to 40%.  Does not appear volume overloaded.  Blood pressure is mildly improved/normotensive over the last 24 hours.  Continue to monitor -Continue to hold metoprolol  and torsemide  given soft pressures, reinitiate as needed - Continue triamcinolone  ointment for dermatitis Type 2 diabetes mellitus (HCC) Stable. -Continue SSI Normocytic anemia Stable s/p 1 unit PRBCs.  A.m. labs pending will follow-up as able -AM CBC, transfuse as needed for hemoglobin less than 8 -If continues to downtrend, or if FOBT positive, will have a conversation with patient about continuing anticoagulation and  consulting GI -Question need for EPO long-term per nephrology Intertrigo Patient complaining of pain in intertriginous folds this morning.  On exam there are small skin tears present with erythema and satellite lesions consistent with intertrigo. - Continue barrier cream as needed - Nystatin  powder 3 times daily as needed to appropriate areas Person awaiting admission to adequate facility elsewhere    *** bullet points preferred for A/P, please avoid repeating one liner/medical decision making, delete statement to sign ***    Chronic and Stable Issues: ***  FEN/GI: *** PPx: *** Dispo:{FPTSDISOLIST:27587} {FPTSDISOTIME:27588}. Barriers include ***.   Subjective:  ***  Objective: Temp:  [97.5 F (36.4 C)-98.5 F (36.9 C)] 97.5 F (36.4 C) (07/14 0749) Pulse Rate:  [69-91] 84 (07/14 0749) Resp:  [18-19] 19 (07/14 0749) BP: (105-122)/(45-70) 107/51 (07/14 0749) SpO2:  [98 %-100 %] 100 % (07/14 0749) Physical Exam: General: *** Cardiovascular: *** Respiratory: *** Abdomen: *** Extremities: ***  Laboratory: Most recent CBC Lab Results  Component Value Date   WBC 6.2 05/25/2024   HGB 8.4 (L) 05/25/2024   HCT 26.8 (L) 05/25/2024   MCV 87.3 05/25/2024   PLT 242 05/25/2024   Most recent BMP    Latest Ref Rng & Units 05/23/2024    2:24 AM  BMP  Glucose 70 - 99 mg/dL 866   BUN 8 - 23 mg/dL 55   Creatinine 9.55 - 1.00 mg/dL 7.75   Sodium 864 - 854 mmol/L 133   Potassium 3.5 - 5.1 mmol/L 3.7   Chloride 98 - 111 mmol/L 105   CO2 22 - 32 mmol/L 24   Calcium  8.9 - 10.3 mg/dL 9.1     Other pertinent labs ***   Imaging/Diagnostic Tests: Radiologist  Impression: *** My interpretation: [name] Manon Jester, DO 05/25/2024, 9:29 AM  PGY-1, [facility] FPTS Intern pager: [phone], text pages welcome Secure chat group [facility] Teaching Service

## 2024-05-25 NOTE — Assessment & Plan Note (Signed)
 Patient complaining of pain in intertriginous folds this morning.  On exam there are small skin tears present with erythema and satellite lesions consistent with intertrigo. - Continue barrier cream as needed - Nystatin  powder 3 times daily as needed to appropriate areas

## 2024-05-26 DIAGNOSIS — I509 Heart failure, unspecified: Secondary | ICD-10-CM | POA: Diagnosis not present

## 2024-05-26 DIAGNOSIS — E119 Type 2 diabetes mellitus without complications: Secondary | ICD-10-CM | POA: Diagnosis not present

## 2024-05-26 DIAGNOSIS — S3210XD Unspecified fracture of sacrum, subsequent encounter for fracture with routine healing: Secondary | ICD-10-CM | POA: Diagnosis not present

## 2024-05-26 DIAGNOSIS — M4628 Osteomyelitis of vertebra, sacral and sacrococcygeal region: Secondary | ICD-10-CM | POA: Diagnosis not present

## 2024-05-27 ENCOUNTER — Telehealth: Payer: Self-pay

## 2024-05-27 NOTE — Telephone Encounter (Addendum)
 Elveria with [facility] called and would like to know if patient needs a follow up with you. Please advise Aryon Nham ONEIDA Ligas, CMA   [facility]-[phone]

## 2024-05-27 NOTE — Telephone Encounter (Signed)
 Gustav with the SNF advised patient does not need a follow up

## 2024-05-28 DIAGNOSIS — D649 Anemia, unspecified: Secondary | ICD-10-CM | POA: Diagnosis not present

## 2024-05-28 DIAGNOSIS — L89154 Pressure ulcer of sacral region, stage 4: Secondary | ICD-10-CM | POA: Diagnosis not present

## 2024-05-28 DIAGNOSIS — F0390 Unspecified dementia without behavioral disturbance: Secondary | ICD-10-CM | POA: Diagnosis not present

## 2024-05-28 DIAGNOSIS — E119 Type 2 diabetes mellitus without complications: Secondary | ICD-10-CM | POA: Diagnosis not present

## 2024-05-28 DIAGNOSIS — I509 Heart failure, unspecified: Secondary | ICD-10-CM | POA: Diagnosis not present

## 2024-05-28 DIAGNOSIS — N184 Chronic kidney disease, stage 4 (severe): Secondary | ICD-10-CM | POA: Diagnosis not present

## 2024-06-01 DIAGNOSIS — M4628 Osteomyelitis of vertebra, sacral and sacrococcygeal region: Secondary | ICD-10-CM | POA: Diagnosis not present

## 2024-06-01 DIAGNOSIS — W19XXXD Unspecified fall, subsequent encounter: Secondary | ICD-10-CM | POA: Diagnosis not present

## 2024-06-01 DIAGNOSIS — E119 Type 2 diabetes mellitus without complications: Secondary | ICD-10-CM | POA: Diagnosis not present

## 2024-06-01 DIAGNOSIS — I509 Heart failure, unspecified: Secondary | ICD-10-CM | POA: Diagnosis not present

## 2024-06-03 DIAGNOSIS — J90 Pleural effusion, not elsewhere classified: Secondary | ICD-10-CM | POA: Diagnosis not present

## 2024-06-03 DIAGNOSIS — R918 Other nonspecific abnormal finding of lung field: Secondary | ICD-10-CM | POA: Diagnosis not present

## 2024-06-04 ENCOUNTER — Inpatient Hospital Stay: Payer: Self-pay | Admitting: Family Medicine

## 2024-06-04 DIAGNOSIS — G8929 Other chronic pain: Secondary | ICD-10-CM | POA: Diagnosis not present

## 2024-06-04 DIAGNOSIS — J189 Pneumonia, unspecified organism: Secondary | ICD-10-CM | POA: Diagnosis not present

## 2024-06-04 DIAGNOSIS — I10 Essential (primary) hypertension: Secondary | ICD-10-CM | POA: Diagnosis not present

## 2024-06-04 DIAGNOSIS — F0390 Unspecified dementia without behavioral disturbance: Secondary | ICD-10-CM | POA: Diagnosis not present

## 2024-06-05 DIAGNOSIS — R799 Abnormal finding of blood chemistry, unspecified: Secondary | ICD-10-CM | POA: Diagnosis not present

## 2024-06-05 DIAGNOSIS — G894 Chronic pain syndrome: Secondary | ICD-10-CM | POA: Diagnosis not present

## 2024-06-06 DIAGNOSIS — F0390 Unspecified dementia without behavioral disturbance: Secondary | ICD-10-CM | POA: Diagnosis not present

## 2024-06-06 DIAGNOSIS — R001 Bradycardia, unspecified: Secondary | ICD-10-CM | POA: Diagnosis not present

## 2024-06-06 DIAGNOSIS — Z7401 Bed confinement status: Secondary | ICD-10-CM | POA: Diagnosis not present

## 2024-06-06 DIAGNOSIS — I5023 Acute on chronic systolic (congestive) heart failure: Secondary | ICD-10-CM | POA: Diagnosis not present

## 2024-06-06 DIAGNOSIS — R404 Transient alteration of awareness: Secondary | ICD-10-CM | POA: Diagnosis not present

## 2024-06-06 DIAGNOSIS — E871 Hypo-osmolality and hyponatremia: Secondary | ICD-10-CM | POA: Diagnosis not present

## 2024-06-06 DIAGNOSIS — T68XXXA Hypothermia, initial encounter: Secondary | ICD-10-CM | POA: Diagnosis not present

## 2024-06-06 DIAGNOSIS — L89154 Pressure ulcer of sacral region, stage 4: Secondary | ICD-10-CM | POA: Diagnosis not present

## 2024-06-06 DIAGNOSIS — G9341 Metabolic encephalopathy: Secondary | ICD-10-CM | POA: Diagnosis not present

## 2024-06-06 DIAGNOSIS — J189 Pneumonia, unspecified organism: Secondary | ICD-10-CM | POA: Diagnosis not present

## 2024-06-06 DIAGNOSIS — J9601 Acute respiratory failure with hypoxia: Secondary | ICD-10-CM | POA: Diagnosis not present

## 2024-06-06 DIAGNOSIS — Z515 Encounter for palliative care: Secondary | ICD-10-CM | POA: Diagnosis not present

## 2024-06-06 DIAGNOSIS — R0603 Acute respiratory distress: Secondary | ICD-10-CM | POA: Diagnosis not present

## 2024-06-06 DIAGNOSIS — I509 Heart failure, unspecified: Secondary | ICD-10-CM | POA: Diagnosis not present

## 2024-06-06 DIAGNOSIS — E875 Hyperkalemia: Secondary | ICD-10-CM | POA: Diagnosis not present

## 2024-06-06 DIAGNOSIS — R6521 Severe sepsis with septic shock: Secondary | ICD-10-CM | POA: Diagnosis not present

## 2024-06-06 DIAGNOSIS — I5021 Acute systolic (congestive) heart failure: Secondary | ICD-10-CM | POA: Diagnosis not present

## 2024-06-06 DIAGNOSIS — M1990 Unspecified osteoarthritis, unspecified site: Secondary | ICD-10-CM | POA: Diagnosis not present

## 2024-06-06 DIAGNOSIS — R0689 Other abnormalities of breathing: Secondary | ICD-10-CM | POA: Diagnosis not present

## 2024-06-06 DIAGNOSIS — A419 Sepsis, unspecified organism: Secondary | ICD-10-CM | POA: Diagnosis not present

## 2024-06-06 DIAGNOSIS — I130 Hypertensive heart and chronic kidney disease with heart failure and stage 1 through stage 4 chronic kidney disease, or unspecified chronic kidney disease: Secondary | ICD-10-CM | POA: Diagnosis not present

## 2024-06-06 DIAGNOSIS — R0902 Hypoxemia: Secondary | ICD-10-CM | POA: Diagnosis not present

## 2024-06-06 DIAGNOSIS — I959 Hypotension, unspecified: Secondary | ICD-10-CM | POA: Diagnosis not present

## 2024-06-06 DIAGNOSIS — N3000 Acute cystitis without hematuria: Secondary | ICD-10-CM | POA: Diagnosis not present

## 2024-06-06 DIAGNOSIS — I4891 Unspecified atrial fibrillation: Secondary | ICD-10-CM | POA: Diagnosis not present

## 2024-06-06 DIAGNOSIS — E785 Hyperlipidemia, unspecified: Secondary | ICD-10-CM | POA: Diagnosis not present

## 2024-06-06 DIAGNOSIS — E1122 Type 2 diabetes mellitus with diabetic chronic kidney disease: Secondary | ICD-10-CM | POA: Diagnosis not present

## 2024-06-06 DIAGNOSIS — R9431 Abnormal electrocardiogram [ECG] [EKG]: Secondary | ICD-10-CM | POA: Diagnosis not present

## 2024-06-06 DIAGNOSIS — K859 Acute pancreatitis without necrosis or infection, unspecified: Secondary | ICD-10-CM | POA: Diagnosis not present

## 2024-06-06 DIAGNOSIS — N189 Chronic kidney disease, unspecified: Secondary | ICD-10-CM | POA: Diagnosis not present

## 2024-06-06 DIAGNOSIS — I2510 Atherosclerotic heart disease of native coronary artery without angina pectoris: Secondary | ICD-10-CM | POA: Diagnosis not present

## 2024-06-06 DIAGNOSIS — N179 Acute kidney failure, unspecified: Secondary | ICD-10-CM | POA: Diagnosis not present

## 2024-06-12 DEATH — deceased

## 2024-06-17 ENCOUNTER — Telehealth (HOSPITAL_COMMUNITY): Payer: Self-pay | Admitting: *Deleted

## 2024-06-17 NOTE — Telephone Encounter (Signed)
 Pt's son called to report pt passed away and cancel appts sch for mon 8/11

## 2024-06-22 ENCOUNTER — Ambulatory Visit (HOSPITAL_COMMUNITY)

## 2024-06-22 ENCOUNTER — Encounter (HOSPITAL_COMMUNITY): Admitting: Cardiology

## 2025-05-10 ENCOUNTER — Encounter
